# Patient Record
Sex: Male | Born: 1935 | Race: White | Hispanic: No | Marital: Married | Smoking: Former smoker
Health system: Southern US, Community
[De-identification: ages and names within clinical notes are randomized; demographics above are authoritative.]

## PROBLEM LIST (undated history)

## (undated) DIAGNOSIS — E1142 Type 2 diabetes mellitus with diabetic polyneuropathy: Secondary | ICD-10-CM

## (undated) DIAGNOSIS — R55 Syncope and collapse: Secondary | ICD-10-CM

## (undated) DIAGNOSIS — Z8673 Personal history of transient ischemic attack (TIA), and cerebral infarction without residual deficits: Secondary | ICD-10-CM

## (undated) DIAGNOSIS — I2510 Atherosclerotic heart disease of native coronary artery without angina pectoris: Secondary | ICD-10-CM

## (undated) DIAGNOSIS — Z862 Personal history of diseases of the blood and blood-forming organs and certain disorders involving the immune mechanism: Secondary | ICD-10-CM

## (undated) DIAGNOSIS — E119 Type 2 diabetes mellitus without complications: Secondary | ICD-10-CM

## (undated) DIAGNOSIS — I10 Essential (primary) hypertension: Secondary | ICD-10-CM

## (undated) DIAGNOSIS — E785 Hyperlipidemia, unspecified: Secondary | ICD-10-CM

## (undated) DIAGNOSIS — I509 Heart failure, unspecified: Secondary | ICD-10-CM

## (undated) DIAGNOSIS — M1990 Unspecified osteoarthritis, unspecified site: Secondary | ICD-10-CM

## (undated) DIAGNOSIS — D490 Neoplasm of unspecified behavior of digestive system: Secondary | ICD-10-CM

## (undated) DIAGNOSIS — I4891 Unspecified atrial fibrillation: Secondary | ICD-10-CM

## (undated) DIAGNOSIS — G40209 Localization-related (focal) (partial) symptomatic epilepsy and epileptic syndromes with complex partial seizures, not intractable, without status epilepticus: Secondary | ICD-10-CM

## (undated) DIAGNOSIS — I739 Peripheral vascular disease, unspecified: Secondary | ICD-10-CM

## (undated) DIAGNOSIS — K5790 Diverticulosis of intestine, part unspecified, without perforation or abscess without bleeding: Secondary | ICD-10-CM

## (undated) HISTORY — DX: Atherosclerotic heart disease of native coronary artery without angina pectoris: I25.10

## (undated) HISTORY — DX: Unspecified atrial fibrillation: I48.91

## (undated) HISTORY — DX: Heart failure, unspecified: I50.9

## (undated) HISTORY — DX: Peripheral vascular disease, unspecified: I73.9

## (undated) HISTORY — PX: TONSILLECTOMY: SUR1361

## (undated) HISTORY — DX: Type 2 diabetes mellitus with diabetic polyneuropathy: E11.42

## (undated) HISTORY — DX: Localization-related (focal) (partial) symptomatic epilepsy and epileptic syndromes with complex partial seizures, not intractable, without status epilepticus: G40.209

## (undated) HISTORY — DX: Hyperlipidemia, unspecified: E78.5

## (undated) HISTORY — DX: Personal history of diseases of the blood and blood-forming organs and certain disorders involving the immune mechanism: Z86.2

## (undated) HISTORY — DX: Syncope and collapse: R55

## (undated) HISTORY — DX: Diverticulosis of intestine, part unspecified, without perforation or abscess without bleeding: K57.90

## (undated) HISTORY — DX: Neoplasm of unspecified behavior of digestive system: D49.0

## (undated) HISTORY — DX: Type 2 diabetes mellitus without complications: E11.9

## (undated) HISTORY — DX: Unspecified osteoarthritis, unspecified site: M19.90

## (undated) HISTORY — DX: Essential (primary) hypertension: I10

## (undated) HISTORY — DX: Personal history of transient ischemic attack (TIA), and cerebral infarction without residual deficits: Z86.73

---

## 1999-09-10 ENCOUNTER — Emergency Department (HOSPITAL_COMMUNITY): Admission: EM | Admit: 1999-09-10 | Discharge: 1999-09-10 | Payer: Self-pay | Admitting: Emergency Medicine

## 2003-12-27 ENCOUNTER — Emergency Department (HOSPITAL_COMMUNITY): Admission: EM | Admit: 2003-12-27 | Discharge: 2003-12-27 | Payer: Self-pay | Admitting: Emergency Medicine

## 2004-01-20 ENCOUNTER — Ambulatory Visit: Payer: Self-pay | Admitting: Pulmonary Disease

## 2004-01-22 ENCOUNTER — Ambulatory Visit: Payer: Self-pay | Admitting: Pulmonary Disease

## 2004-01-23 ENCOUNTER — Ambulatory Visit: Payer: Self-pay | Admitting: Cardiology

## 2004-01-26 ENCOUNTER — Inpatient Hospital Stay (HOSPITAL_BASED_OUTPATIENT_CLINIC_OR_DEPARTMENT_OTHER): Admission: RE | Admit: 2004-01-26 | Discharge: 2004-01-26 | Payer: Self-pay | Admitting: Cardiology

## 2004-02-13 ENCOUNTER — Inpatient Hospital Stay (HOSPITAL_COMMUNITY)
Admission: RE | Admit: 2004-02-13 | Discharge: 2004-02-19 | Payer: Self-pay | Admitting: Thoracic Surgery (Cardiothoracic Vascular Surgery)

## 2004-02-13 HISTORY — PX: CORONARY ARTERY BYPASS GRAFT: SHX141

## 2004-03-02 ENCOUNTER — Ambulatory Visit: Payer: Self-pay | Admitting: Pulmonary Disease

## 2004-03-02 ENCOUNTER — Ambulatory Visit: Payer: Self-pay | Admitting: Cardiology

## 2004-03-22 ENCOUNTER — Encounter (HOSPITAL_COMMUNITY): Admission: RE | Admit: 2004-03-22 | Discharge: 2004-06-20 | Payer: Self-pay | Admitting: Cardiology

## 2004-03-22 ENCOUNTER — Encounter
Admission: RE | Admit: 2004-03-22 | Discharge: 2004-03-22 | Payer: Self-pay | Admitting: Thoracic Surgery (Cardiothoracic Vascular Surgery)

## 2004-04-02 ENCOUNTER — Ambulatory Visit: Payer: Self-pay | Admitting: Cardiology

## 2004-04-05 ENCOUNTER — Ambulatory Visit: Payer: Self-pay | Admitting: Cardiology

## 2004-04-15 ENCOUNTER — Ambulatory Visit: Payer: Self-pay | Admitting: Pulmonary Disease

## 2004-04-29 ENCOUNTER — Ambulatory Visit: Payer: Self-pay | Admitting: Pulmonary Disease

## 2004-05-11 ENCOUNTER — Inpatient Hospital Stay (HOSPITAL_COMMUNITY): Admission: AD | Admit: 2004-05-11 | Discharge: 2004-05-13 | Payer: Self-pay | Admitting: Pulmonary Disease

## 2004-05-11 ENCOUNTER — Ambulatory Visit: Payer: Self-pay | Admitting: Pulmonary Disease

## 2004-05-12 ENCOUNTER — Encounter (INDEPENDENT_AMBULATORY_CARE_PROVIDER_SITE_OTHER): Payer: Self-pay | Admitting: *Deleted

## 2004-05-27 ENCOUNTER — Ambulatory Visit: Payer: Self-pay | Admitting: Pulmonary Disease

## 2004-06-18 ENCOUNTER — Ambulatory Visit: Payer: Self-pay | Admitting: Pulmonary Disease

## 2004-06-21 ENCOUNTER — Encounter (HOSPITAL_COMMUNITY): Admission: RE | Admit: 2004-06-21 | Discharge: 2004-09-19 | Payer: Self-pay | Admitting: Cardiology

## 2004-06-29 ENCOUNTER — Encounter
Admission: RE | Admit: 2004-06-29 | Discharge: 2004-06-29 | Payer: Self-pay | Admitting: Thoracic Surgery (Cardiothoracic Vascular Surgery)

## 2004-07-07 ENCOUNTER — Ambulatory Visit: Payer: Self-pay | Admitting: Cardiology

## 2004-07-15 ENCOUNTER — Ambulatory Visit: Payer: Self-pay | Admitting: Cardiology

## 2004-07-26 ENCOUNTER — Encounter
Admission: RE | Admit: 2004-07-26 | Discharge: 2004-07-26 | Payer: Self-pay | Admitting: Thoracic Surgery (Cardiothoracic Vascular Surgery)

## 2004-08-19 ENCOUNTER — Ambulatory Visit: Payer: Self-pay | Admitting: Cardiology

## 2004-08-23 ENCOUNTER — Encounter
Admission: RE | Admit: 2004-08-23 | Discharge: 2004-08-23 | Payer: Self-pay | Admitting: Thoracic Surgery (Cardiothoracic Vascular Surgery)

## 2004-08-26 ENCOUNTER — Ambulatory Visit: Payer: Self-pay | Admitting: Pulmonary Disease

## 2004-10-11 ENCOUNTER — Ambulatory Visit: Payer: Self-pay | Admitting: Cardiology

## 2004-12-30 ENCOUNTER — Ambulatory Visit: Payer: Self-pay | Admitting: Pulmonary Disease

## 2005-01-14 ENCOUNTER — Ambulatory Visit: Payer: Self-pay | Admitting: Cardiology

## 2005-04-01 ENCOUNTER — Ambulatory Visit: Payer: Self-pay | Admitting: Pulmonary Disease

## 2005-07-27 ENCOUNTER — Ambulatory Visit: Payer: Self-pay | Admitting: Pulmonary Disease

## 2005-11-24 ENCOUNTER — Ambulatory Visit: Payer: Self-pay | Admitting: Pulmonary Disease

## 2006-01-03 ENCOUNTER — Encounter: Admission: RE | Admit: 2006-01-03 | Discharge: 2006-01-03 | Payer: Self-pay | Admitting: Orthopedic Surgery

## 2006-01-16 ENCOUNTER — Ambulatory Visit: Payer: Self-pay | Admitting: Cardiology

## 2006-03-30 ENCOUNTER — Ambulatory Visit: Payer: Self-pay | Admitting: Pulmonary Disease

## 2006-03-30 LAB — CONVERTED CEMR LAB
ALT: 18 units/L (ref 0–40)
AST: 18 units/L (ref 0–37)
Albumin: 3.7 g/dL (ref 3.5–5.2)
Alkaline Phosphatase: 56 units/L (ref 39–117)
BUN: 20 mg/dL (ref 6–23)
Basophils Absolute: 0.1 10*3/uL (ref 0.0–0.1)
Basophils Relative: 0.8 % (ref 0.0–1.0)
CO2: 27 meq/L (ref 19–32)
Calcium: 9.7 mg/dL (ref 8.4–10.5)
Chloride: 105 meq/L (ref 96–112)
Cholesterol: 119 mg/dL (ref 0–200)
Creatinine, Ser: 1 mg/dL (ref 0.4–1.5)
Eosinophils Relative: 0.7 % (ref 0.0–5.0)
GFR calc Af Amer: 96 mL/min
GFR calc non Af Amer: 79 mL/min
Glucose, Bld: 94 mg/dL (ref 70–99)
HCT: 40.9 % (ref 39.0–52.0)
HDL: 33.8 mg/dL — ABNORMAL LOW (ref >39.0)
Hemoglobin: 13.8 g/dL (ref 13.0–17.0)
Hgb A1c MFr Bld: 6.3 % — ABNORMAL HIGH (ref 4.6–6.0)
LDL Cholesterol: 60 mg/dL (ref 0–99)
Lymphocytes Relative: 20.6 % (ref 12.0–46.0)
MCHC: 33.6 g/dL (ref 30.0–36.0)
MCV: 93.4 fL (ref 78.0–100.0)
Monocytes Absolute: 0.9 10*3/uL — ABNORMAL HIGH (ref 0.2–0.7)
Monocytes Relative: 8.1 % (ref 3.0–11.0)
Neutro Abs: 7.4 10*3/uL (ref 1.4–7.7)
Neutrophils Relative %: 69.8 % (ref 43.0–77.0)
PSA: 2 ng/mL (ref 0.10–4.00)
PSA: NORMAL ng/mL
Platelets: 317 10*3/uL (ref 150–400)
Potassium: 4.3 meq/L (ref 3.5–5.1)
RBC: 4.38 M/uL (ref 4.22–5.81)
RDW: 11.4 % — ABNORMAL LOW (ref 11.5–14.6)
Sodium: 139 meq/L (ref 135–145)
TSH: 0.98 microintl units/mL (ref 0.35–5.50)
Total Bilirubin: 0.7 mg/dL (ref 0.3–1.2)
Total CHOL/HDL Ratio: 3.5
Total Protein: 7.4 g/dL (ref 6.0–8.3)
Triglycerides: 125 mg/dL (ref 0–149)
VLDL: 25 mg/dL (ref 0–40)
WBC: 10.7 10*3/uL — ABNORMAL HIGH (ref 4.5–10.5)

## 2006-05-13 ENCOUNTER — Emergency Department (HOSPITAL_COMMUNITY): Admission: EM | Admit: 2006-05-13 | Discharge: 2006-05-13 | Payer: Self-pay | Admitting: Emergency Medicine

## 2006-09-28 ENCOUNTER — Ambulatory Visit: Payer: Self-pay | Admitting: Pulmonary Disease

## 2006-09-28 LAB — CONVERTED CEMR LAB
BUN: 22 mg/dL (ref 6–23)
CO2: 28 meq/L (ref 19–32)
Calcium: 9.4 mg/dL (ref 8.4–10.5)
Chloride: 105 meq/L (ref 96–112)
Cholesterol: 113 mg/dL (ref 0–200)
Creatinine, Ser: 1.2 mg/dL (ref 0.4–1.5)
GFR calc Af Amer: 77 mL/min
GFR calc non Af Amer: 64 mL/min
Glucose, Bld: 94 mg/dL (ref 70–99)
HDL: 32.2 mg/dL — ABNORMAL LOW (ref >39.0)
Hgb A1c MFr Bld: 6.2 % — ABNORMAL HIGH (ref 4.6–6.0)
LDL Cholesterol: 60 mg/dL (ref 0–99)
Potassium: 4.1 meq/L (ref 3.5–5.1)
Sodium: 140 meq/L (ref 135–145)
Total CHOL/HDL Ratio: 3.5
Triglycerides: 105 mg/dL (ref 0–149)
VLDL: 21 mg/dL (ref 0–40)

## 2006-09-29 ENCOUNTER — Encounter: Payer: Self-pay | Admitting: Pulmonary Disease

## 2006-09-29 DIAGNOSIS — I10 Essential (primary) hypertension: Secondary | ICD-10-CM

## 2006-09-29 DIAGNOSIS — K5730 Diverticulosis of large intestine without perforation or abscess without bleeding: Secondary | ICD-10-CM | POA: Insufficient documentation

## 2006-09-29 DIAGNOSIS — I739 Peripheral vascular disease, unspecified: Secondary | ICD-10-CM | POA: Insufficient documentation

## 2006-09-29 DIAGNOSIS — E1142 Type 2 diabetes mellitus with diabetic polyneuropathy: Secondary | ICD-10-CM | POA: Insufficient documentation

## 2006-09-29 DIAGNOSIS — E1159 Type 2 diabetes mellitus with other circulatory complications: Secondary | ICD-10-CM | POA: Insufficient documentation

## 2006-09-29 DIAGNOSIS — D490 Neoplasm of unspecified behavior of digestive system: Secondary | ICD-10-CM | POA: Insufficient documentation

## 2006-09-29 DIAGNOSIS — G40209 Localization-related (focal) (partial) symptomatic epilepsy and epileptic syndromes with complex partial seizures, not intractable, without status epilepticus: Secondary | ICD-10-CM | POA: Insufficient documentation

## 2006-11-08 ENCOUNTER — Ambulatory Visit: Payer: Self-pay | Admitting: Pulmonary Disease

## 2007-01-23 ENCOUNTER — Ambulatory Visit: Payer: Self-pay | Admitting: Cardiology

## 2007-03-30 DIAGNOSIS — M1990 Unspecified osteoarthritis, unspecified site: Secondary | ICD-10-CM | POA: Insufficient documentation

## 2007-03-30 DIAGNOSIS — E785 Hyperlipidemia, unspecified: Secondary | ICD-10-CM | POA: Insufficient documentation

## 2007-04-23 ENCOUNTER — Telehealth (INDEPENDENT_AMBULATORY_CARE_PROVIDER_SITE_OTHER): Payer: Self-pay | Admitting: *Deleted

## 2007-05-16 ENCOUNTER — Ambulatory Visit: Payer: Self-pay | Admitting: Pulmonary Disease

## 2007-05-16 DIAGNOSIS — I4891 Unspecified atrial fibrillation: Secondary | ICD-10-CM

## 2007-05-16 DIAGNOSIS — E119 Type 2 diabetes mellitus without complications: Secondary | ICD-10-CM

## 2007-05-16 DIAGNOSIS — J209 Acute bronchitis, unspecified: Secondary | ICD-10-CM

## 2007-05-16 DIAGNOSIS — I2510 Atherosclerotic heart disease of native coronary artery without angina pectoris: Secondary | ICD-10-CM | POA: Insufficient documentation

## 2007-05-16 DIAGNOSIS — D649 Anemia, unspecified: Secondary | ICD-10-CM

## 2007-05-16 DIAGNOSIS — I639 Cerebral infarction, unspecified: Secondary | ICD-10-CM | POA: Insufficient documentation

## 2007-05-21 ENCOUNTER — Telehealth (INDEPENDENT_AMBULATORY_CARE_PROVIDER_SITE_OTHER): Payer: Self-pay | Admitting: *Deleted

## 2007-05-22 LAB — CONVERTED CEMR LAB
ALT: 27 units/L (ref 0–53)
AST: 25 units/L (ref 0–37)
Albumin: 3.6 g/dL (ref 3.5–5.2)
Basophils Absolute: 0 10*3/uL (ref 0.0–0.1)
Calcium: 9.5 mg/dL (ref 8.4–10.5)
Chloride: 104 meq/L (ref 96–112)
Creatinine, Ser: 1.3 mg/dL (ref 0.4–1.5)
Eosinophils Absolute: 0.2 10*3/uL (ref 0.0–0.6)
Eosinophils Relative: 1.7 % (ref 0.0–5.0)
GFR calc non Af Amer: 58 mL/min
Glucose, Bld: 141 mg/dL — ABNORMAL HIGH (ref 70–99)
HCT: 41.2 % (ref 39.0–52.0)
LDL Cholesterol: 67 mg/dL (ref 0–99)
MCV: 92 fL (ref 78.0–100.0)
Neutrophils Relative %: 67.2 % (ref 43.0–77.0)
Platelets: 292 10*3/uL (ref 150–400)
RBC: 4.48 M/uL (ref 4.22–5.81)
RDW: 12.3 % (ref 11.5–14.6)
Total Bilirubin: 0.9 mg/dL (ref 0.3–1.2)
Total CHOL/HDL Ratio: 3.8
Triglycerides: 151 mg/dL — ABNORMAL HIGH (ref 0–149)
WBC: 10.7 10*3/uL — ABNORMAL HIGH (ref 4.5–10.5)

## 2007-05-29 ENCOUNTER — Telehealth (INDEPENDENT_AMBULATORY_CARE_PROVIDER_SITE_OTHER): Payer: Self-pay | Admitting: *Deleted

## 2007-05-29 ENCOUNTER — Encounter: Payer: Self-pay | Admitting: Pulmonary Disease

## 2007-06-29 ENCOUNTER — Encounter: Payer: Self-pay | Admitting: Pulmonary Disease

## 2007-07-27 ENCOUNTER — Telehealth: Payer: Self-pay | Admitting: Pulmonary Disease

## 2007-07-27 ENCOUNTER — Telehealth (INDEPENDENT_AMBULATORY_CARE_PROVIDER_SITE_OTHER): Payer: Self-pay | Admitting: *Deleted

## 2007-08-03 ENCOUNTER — Ambulatory Visit: Payer: Self-pay | Admitting: Internal Medicine

## 2007-08-03 ENCOUNTER — Ambulatory Visit: Payer: Self-pay | Admitting: Pulmonary Disease

## 2007-08-03 DIAGNOSIS — M542 Cervicalgia: Secondary | ICD-10-CM | POA: Insufficient documentation

## 2007-08-13 HISTORY — PX: OTHER SURGICAL HISTORY: SHX169

## 2007-08-17 ENCOUNTER — Ambulatory Visit: Payer: Self-pay | Admitting: Internal Medicine

## 2007-09-05 ENCOUNTER — Encounter: Payer: Self-pay | Admitting: Pulmonary Disease

## 2007-09-12 ENCOUNTER — Ambulatory Visit (HOSPITAL_BASED_OUTPATIENT_CLINIC_OR_DEPARTMENT_OTHER): Admission: RE | Admit: 2007-09-12 | Discharge: 2007-09-12 | Payer: Self-pay | Admitting: Surgery

## 2007-09-12 ENCOUNTER — Encounter (INDEPENDENT_AMBULATORY_CARE_PROVIDER_SITE_OTHER): Payer: Self-pay | Admitting: Surgery

## 2007-11-12 ENCOUNTER — Ambulatory Visit: Payer: Self-pay | Admitting: Pulmonary Disease

## 2007-11-14 LAB — CONVERTED CEMR LAB
Albumin: 3.9 g/dL (ref 3.5–5.2)
BUN: 42 mg/dL — ABNORMAL HIGH (ref 6–23)
Calcium: 8.8 mg/dL (ref 8.4–10.5)
Cholesterol: 95 mg/dL (ref 0–200)
Creatinine, Ser: 1.3 mg/dL (ref 0.4–1.5)
GFR calc Af Amer: 70 mL/min
GFR calc non Af Amer: 58 mL/min
Hgb A1c MFr Bld: 6.3 % — ABNORMAL HIGH (ref 4.6–6.0)
LDL Cholesterol: 47 mg/dL (ref 0–99)
Total CHOL/HDL Ratio: 3.2
Triglycerides: 89 mg/dL (ref 0–149)
VLDL: 18 mg/dL (ref 0–40)

## 2008-01-28 ENCOUNTER — Telehealth: Payer: Self-pay | Admitting: Pulmonary Disease

## 2008-01-29 ENCOUNTER — Ambulatory Visit: Payer: Self-pay | Admitting: Cardiology

## 2008-02-05 ENCOUNTER — Ambulatory Visit: Payer: Self-pay | Admitting: Cardiology

## 2008-02-05 LAB — CONVERTED CEMR LAB
CO2: 28 meq/L (ref 19–32)
Calcium: 9 mg/dL (ref 8.4–10.5)
Chloride: 106 meq/L (ref 96–112)
Chloride: 106 meq/L (ref 96–112)
Creatinine, Ser: 0.9 mg/dL (ref 0.4–1.5)
GFR calc non Af Amer: 89 mL/min
Potassium: 3.6 meq/L (ref 3.5–5.1)
Potassium: 3.6 meq/L (ref 3.5–5.1)
Sodium: 140 meq/L (ref 135–145)
Sodium: 140 meq/L (ref 135–145)

## 2008-04-15 ENCOUNTER — Encounter: Payer: Self-pay | Admitting: Pulmonary Disease

## 2008-04-29 ENCOUNTER — Telehealth: Payer: Self-pay | Admitting: Pulmonary Disease

## 2008-05-12 ENCOUNTER — Ambulatory Visit: Payer: Self-pay | Admitting: Pulmonary Disease

## 2008-05-13 ENCOUNTER — Telehealth (INDEPENDENT_AMBULATORY_CARE_PROVIDER_SITE_OTHER): Payer: Self-pay | Admitting: *Deleted

## 2008-05-18 LAB — CONVERTED CEMR LAB
Basophils Absolute: 0 10*3/uL (ref 0.0–0.1)
Bilirubin, Direct: 0.2 mg/dL (ref 0.0–0.3)
Calcium: 9.7 mg/dL (ref 8.4–10.5)
Cholesterol: 119 mg/dL (ref 0–200)
GFR calc Af Amer: 77 mL/min
GFR calc non Af Amer: 63 mL/min
HCT: 40.8 % (ref 39.0–52.0)
Hemoglobin: 14.1 g/dL (ref 13.0–17.0)
LDL Cholesterol: 65 mg/dL (ref 0–99)
Lymphocytes Relative: 35.9 % (ref 12.0–46.0)
MCHC: 34.5 g/dL (ref 30.0–36.0)
Monocytes Absolute: 0.8 10*3/uL (ref 0.1–1.0)
Neutro Abs: 3.5 10*3/uL (ref 1.4–7.7)
PSA: 4.09 ng/mL — ABNORMAL HIGH (ref 0.10–4.00)
RDW: 12.4 % (ref 11.5–14.6)
Sodium: 140 meq/L (ref 135–145)
TSH: 1.54 microintl units/mL (ref 0.35–5.50)
Total Bilirubin: 0.8 mg/dL (ref 0.3–1.2)
Triglycerides: 84 mg/dL (ref 0–149)

## 2008-05-20 ENCOUNTER — Ambulatory Visit: Payer: Self-pay | Admitting: Pulmonary Disease

## 2008-05-20 LAB — CONVERTED CEMR LAB
Leukocytes, UA: NEGATIVE
Specific Gravity, Urine: 1.025 (ref 1.000–1.035)
Urobilinogen, UA: 0.2 (ref 0.0–1.0)

## 2008-05-26 ENCOUNTER — Ambulatory Visit: Payer: Self-pay | Admitting: Internal Medicine

## 2008-06-03 LAB — CONVERTED CEMR LAB
Hemoglobin, Urine: NEGATIVE
Nitrite: NEGATIVE
Urine Glucose: NEGATIVE mg/dL
Urobilinogen, UA: 0.2 (ref 0.0–1.0)

## 2008-06-04 ENCOUNTER — Ambulatory Visit: Payer: Self-pay | Admitting: Internal Medicine

## 2008-06-23 ENCOUNTER — Ambulatory Visit: Payer: Self-pay | Admitting: Pulmonary Disease

## 2008-06-30 LAB — CONVERTED CEMR LAB: PSA: 3.21 ng/mL (ref 0.10–4.00)

## 2008-07-29 ENCOUNTER — Telehealth: Payer: Self-pay | Admitting: Pulmonary Disease

## 2008-11-06 ENCOUNTER — Encounter (INDEPENDENT_AMBULATORY_CARE_PROVIDER_SITE_OTHER): Payer: Self-pay | Admitting: *Deleted

## 2008-11-10 ENCOUNTER — Ambulatory Visit: Payer: Self-pay | Admitting: Pulmonary Disease

## 2008-11-11 LAB — CONVERTED CEMR LAB
AST: 19 units/L (ref 0–37)
Albumin: 4.1 g/dL (ref 3.5–5.2)
Alkaline Phosphatase: 42 units/L (ref 39–117)
Bilirubin, Direct: 0.1 mg/dL (ref 0.0–0.3)
GFR calc non Af Amer: 70.02 mL/min (ref >60)
Glucose, Bld: 72 mg/dL (ref 70–99)
Potassium: 4.6 meq/L (ref 3.5–5.1)
Sodium: 140 meq/L (ref 135–145)
Total CHOL/HDL Ratio: 3
VLDL: 18.4 mg/dL (ref 0.0–40.0)

## 2008-12-04 ENCOUNTER — Encounter: Payer: Self-pay | Admitting: Pulmonary Disease

## 2008-12-12 ENCOUNTER — Encounter: Payer: Self-pay | Admitting: Pulmonary Disease

## 2009-01-21 ENCOUNTER — Ambulatory Visit: Payer: Self-pay | Admitting: Cardiology

## 2009-01-26 ENCOUNTER — Telehealth (INDEPENDENT_AMBULATORY_CARE_PROVIDER_SITE_OTHER): Payer: Self-pay

## 2009-01-27 ENCOUNTER — Ambulatory Visit: Payer: Self-pay | Admitting: Cardiology

## 2009-01-27 ENCOUNTER — Ambulatory Visit: Payer: Self-pay

## 2009-01-27 ENCOUNTER — Encounter (HOSPITAL_COMMUNITY): Admission: RE | Admit: 2009-01-27 | Discharge: 2009-01-27 | Payer: Self-pay | Admitting: Cardiology

## 2009-01-29 ENCOUNTER — Encounter: Payer: Self-pay | Admitting: Cardiology

## 2009-01-29 ENCOUNTER — Ambulatory Visit: Payer: Self-pay | Admitting: Cardiology

## 2009-01-29 DIAGNOSIS — R9430 Abnormal result of cardiovascular function study, unspecified: Secondary | ICD-10-CM | POA: Insufficient documentation

## 2009-01-29 LAB — CONVERTED CEMR LAB
BUN: 16 mg/dL (ref 6–23)
CO2: 27 meq/L (ref 19–32)
Chloride: 107 meq/L (ref 96–112)
Creatinine, Ser: 1.1 mg/dL (ref 0.4–1.5)
Eosinophils Absolute: 0.2 10*3/uL (ref 0.0–0.7)
Glucose, Bld: 81 mg/dL (ref 70–99)
HCT: 38.2 % — ABNORMAL LOW (ref 39.0–52.0)
INR: 1.2 — ABNORMAL HIGH (ref 0.8–1.0)
Lymphs Abs: 2.4 10*3/uL (ref 0.7–4.0)
MCHC: 34.7 g/dL (ref 30.0–36.0)
MCV: 95.3 fL (ref 78.0–100.0)
Monocytes Absolute: 0.6 10*3/uL (ref 0.1–1.0)
Neutrophils Relative %: 53.1 % (ref 43.0–77.0)
Platelets: 248 10*3/uL (ref 150.0–400.0)
Prothrombin Time: 12 s — ABNORMAL HIGH (ref 9.1–11.7)
RDW: 12.3 % (ref 11.5–14.6)

## 2009-02-02 ENCOUNTER — Ambulatory Visit: Payer: Self-pay | Admitting: Cardiovascular Disease

## 2009-02-02 ENCOUNTER — Inpatient Hospital Stay (HOSPITAL_BASED_OUTPATIENT_CLINIC_OR_DEPARTMENT_OTHER): Admission: RE | Admit: 2009-02-02 | Discharge: 2009-02-02 | Payer: Self-pay | Admitting: Cardiovascular Disease

## 2009-02-10 ENCOUNTER — Encounter: Payer: Self-pay | Admitting: Cardiology

## 2009-02-11 ENCOUNTER — Ambulatory Visit: Payer: Self-pay | Admitting: Cardiology

## 2009-02-11 ENCOUNTER — Ambulatory Visit: Payer: Self-pay | Admitting: Cardiovascular Disease

## 2009-02-11 DIAGNOSIS — R0602 Shortness of breath: Secondary | ICD-10-CM

## 2009-02-11 LAB — CONVERTED CEMR LAB
BUN: 26 mg/dL — ABNORMAL HIGH (ref 6–23)
Creatinine, Ser: 1.3 mg/dL (ref 0.4–1.5)
GFR calc non Af Amer: 57.7 mL/min (ref >60)
Glucose, Bld: 111 mg/dL — ABNORMAL HIGH (ref 70–99)

## 2009-04-20 ENCOUNTER — Ambulatory Visit: Payer: Self-pay | Admitting: Cardiology

## 2009-04-20 DIAGNOSIS — I509 Heart failure, unspecified: Secondary | ICD-10-CM

## 2009-05-05 ENCOUNTER — Telehealth (INDEPENDENT_AMBULATORY_CARE_PROVIDER_SITE_OTHER): Payer: Self-pay | Admitting: *Deleted

## 2009-05-11 ENCOUNTER — Ambulatory Visit: Payer: Self-pay | Admitting: Pulmonary Disease

## 2009-05-12 LAB — CONVERTED CEMR LAB
ALT: 18 units/L (ref 0–53)
AST: 20 units/L (ref 0–37)
Alkaline Phosphatase: 43 units/L (ref 39–117)
BUN: 32 mg/dL — ABNORMAL HIGH (ref 6–23)
Basophils Absolute: 0 10*3/uL (ref 0.0–0.1)
Chloride: 108 meq/L (ref 96–112)
Cholesterol: 117 mg/dL (ref 0–200)
Eosinophils Absolute: 0.1 10*3/uL (ref 0.0–0.7)
GFR calc non Af Amer: 52.93 mL/min (ref >60)
Glucose, Bld: 101 mg/dL — ABNORMAL HIGH (ref 70–99)
HCT: 40.2 % (ref 39.0–52.0)
Hgb A1c MFr Bld: 6.1 % (ref 4.6–6.5)
LDL Cholesterol: 54 mg/dL (ref 0–99)
Lymphs Abs: 2.2 10*3/uL (ref 0.7–4.0)
MCV: 95.6 fL (ref 78.0–100.0)
Microalb Creat Ratio: 13.7 mg/g (ref 0.0–30.0)
Monocytes Absolute: 0.6 10*3/uL (ref 0.1–1.0)
Neutrophils Relative %: 46.6 % (ref 43.0–77.0)
Platelets: 200 10*3/uL (ref 150.0–400.0)
Potassium: 5.1 meq/L (ref 3.5–5.1)
RDW: 12.8 % (ref 11.5–14.6)
Sodium: 142 meq/L (ref 135–145)
TSH: 1.27 microintl units/mL (ref 0.35–5.50)
Total Bilirubin: 0.7 mg/dL (ref 0.3–1.2)
VLDL: 20.2 mg/dL (ref 0.0–40.0)
WBC: 5.5 10*3/uL (ref 4.5–10.5)

## 2009-08-03 ENCOUNTER — Telehealth: Payer: Self-pay | Admitting: Pulmonary Disease

## 2009-08-05 ENCOUNTER — Telehealth (INDEPENDENT_AMBULATORY_CARE_PROVIDER_SITE_OTHER): Payer: Self-pay | Admitting: *Deleted

## 2009-10-16 ENCOUNTER — Ambulatory Visit: Payer: Self-pay | Admitting: Cardiology

## 2009-10-30 ENCOUNTER — Ambulatory Visit: Payer: Self-pay | Admitting: Cardiology

## 2009-11-03 ENCOUNTER — Telehealth (INDEPENDENT_AMBULATORY_CARE_PROVIDER_SITE_OTHER): Payer: Self-pay | Admitting: *Deleted

## 2009-11-03 LAB — CONVERTED CEMR LAB
CO2: 27 meq/L (ref 19–32)
Chloride: 105 meq/L (ref 96–112)
Creatinine, Ser: 1.2 mg/dL (ref 0.4–1.5)
Glucose, Bld: 113 mg/dL — ABNORMAL HIGH (ref 70–99)

## 2009-11-09 ENCOUNTER — Ambulatory Visit: Payer: Self-pay | Admitting: Pulmonary Disease

## 2010-02-02 ENCOUNTER — Telehealth: Payer: Self-pay | Admitting: Pulmonary Disease

## 2010-04-11 LAB — CONVERTED CEMR LAB
ALT: 25 units/L (ref 0–53)
Bilirubin, Direct: 0.1 mg/dL (ref 0.0–0.3)
Chloride: 105 meq/L (ref 96–112)
GFR calc non Af Amer: 63.16 mL/min (ref >60)
Glucose, Bld: 117 mg/dL — ABNORMAL HIGH (ref 70–99)
HDL: 31.4 mg/dL — ABNORMAL LOW (ref >39.00)
LDL Cholesterol: 53 mg/dL (ref 0–99)
Potassium: 5.4 meq/L — ABNORMAL HIGH (ref 3.5–5.1)
Sodium: 140 meq/L (ref 135–145)
Total Bilirubin: 0.8 mg/dL (ref 0.3–1.2)
Total CHOL/HDL Ratio: 4
VLDL: 31.6 mg/dL (ref 0.0–40.0)

## 2010-04-13 NOTE — Assessment & Plan Note (Signed)
Summary: 6 MO F/U    CC:  check up.  History of Present Illness: Fred [name] is a pleasant gentleman who has a history of coronary artery disease status post coronary artery bypass graft in December 2005. I saw him for an abnormal Myoview in November of 2010. There was a prior inferior infarct with moderate peri-infarct ischemia. Ejection fraction was 63%. We scheduled a cardiac catheterization. This was performed on February 02, 2009. He had an ejection fraction of 55% but his filling pressures were elevated with a LVEDP of 27. 2 of 4 grafts were patent. He was started on diuretics at that time. I last saw him in February of 2011. Since then the patient has dyspnea with more extreme activities but not with routine activities. It is relieved with rest. It is not associated with chest pain. There is no orthopnea, PND or pedal edema. There is no syncope or palpitations. There is no exertional chest pain.   Current Medications (verified): 1)  Bayer Aspirin 325 Mg  Tabs (Aspirin) .... Take 1 Tablet By Mouth Once A Day 2)  Metoprolol Succinate 50 Mg Xr24h-Tab (Metoprolol Succinate) .... Take 1 Tab By Mouth Once Daily.Marland KitchenMarland Kitchen 3)  Zestril 10 Mg  Tabs (Lisinopril) .... Take 1 Tablet By Mouth Once A Day 4)  Lasix 40 Mg Tabs (Furosemide) .... Take One Daily 5)  Klor-Con M20 20 Meq Cr-Tabs (Potassium Chloride Crys Cr) .Marland Kitchen.. 1 Tab Once Daily 6)  Zocor 40 Mg  Tabs (Simvastatin) .... Take 1 Tablet By Mouth Once A Day 7)  Zetia 10 Mg  Tabs (Ezetimibe) .... Take 1 Tablet By Mouth Once A Day 8)  Niaspan 500 Mg  Tbcr (Niacin (Antihyperlipidemic)) .... Take 1 Tab By Mouth At Bedtime 9)  Co Q-10 30 Mg  Caps (Coenzyme Q10) .Marland Kitchen.. 1 Tab By Mouth Once Daily 10)  Glucophage 1000 Mg  Tabs (Metformin Hcl) .... Take 1/2 Tab By Mouth Two Times A Day 11)  Glimepiride 4 Mg  Tabs (Glimepiride) .... Take 1/2 Tab By Mouth Once Daily 12)  Januvia 100 Mg  Tabs (Sitagliptin Phosphate) .... Take 1/2 Tablet By Mouth Once A  Day  Allergies: No Known Drug Allergies  Past History:  Past Medical History: HYPERTENSION (ICD-401.9) C A D - S/P C A B G (ICD-414.9) Hx of postoperative ATRIAL FIBRILLATION (ICD-427.31) PERIPHERAL VASCULAR DISEASE (ICD-443.9) Hx of STROKE (ICD-434.91) HYPERLIPIDEMIA (ICD-272.4) PAROTID LESION, UNSPECIFIED (ICD-239.0) DIABETES MELLITUS (ICD-250.00) DIVERTICULOSIS (ICD-562.10) POLYNEUROPATHY-DIABETIC (ICD-357.2) SEIZURE-PARTIAL COMPLEX (ICD-345.40) DEGENERATIVE JOINT DISEASE (ICD-715.90)  Past Surgical History: Reviewed history from 05/11/2009 and no changes required. S/P coronary artery bypass graft S/P removal of sebaceous cyst from neck 6/09 by DrCornett  Social History: Reviewed history from 11/12/2007 and no changes required. Married quit smoking in 1979 exercises occasionaly caffeine use:  2 cups per day no etoh 3 children  Review of Systems       no fevers or chills, productive cough, hemoptysis, dysphasia, odynophagia, melena, hematochezia, dysuria, hematuria, rash, seizure activity, orthopnea, PND, pedal edema, claudication. Remaining systems are negative.   Vital Signs:  Patient profile:   75 year old male Height:      63 inches Weight:      191 pounds BMI:     33.96 Pulse rate:   65 / minute Resp:     12 per minute BP sitting:   128 / 69  (left arm)  Vitals Entered By: Fred [name] (October 16, 2009 9:46 AM)  Physical Exam  General:  Well-developed well-nourished in  no acute distress.  Skin is warm and dry.  HEENT is normal.  Neck is supple. No thyromegaly.  Chest is clear to auscultation with normal expansion.  Cardiovascular exam is regular rate and rhythm.  Abdominal exam nontender or distended. No masses palpated. Extremities show no edema. neuro grossly intact    EKG  Procedure date:  10/16/2009  Findings:      Sinus rhythm at a rate of 64. Left axis deviation. No ST changes.  Impression & Recommendations:  Problem # 1:   CHF (ICD-428.0) Patient with history of diastolic congestive heart failure. Euvolemic on examination. Continue Lasix. Check renal function and potassium. His updated medication list for this problem includes:    Bayer Aspirin 325 Mg Tabs (Aspirin) .Marland Kitchen... Take 1 tablet by mouth once a day    Metoprolol Succinate 50 Mg Xr24h-tab (Metoprolol succinate) .Marland Kitchen... Take 1 tab by mouth once daily...    Zestril 10 Mg Tabs (Lisinopril) .Marland Kitchen... Take 1 tablet by mouth once a day    Lasix 40 Mg Tabs (Furosemide) .Marland Kitchen... Take one daily  Problem # 2:  HYPERTENSION (ICD-401.9) Blood pressure controlled on present medications. Will continue. His updated medication list for this problem includes:    Bayer Aspirin 325 Mg Tabs (Aspirin) .Marland Kitchen... Take 1 tablet by mouth once a day    Metoprolol Succinate 50 Mg Xr24h-tab (Metoprolol succinate) .Marland Kitchen... Take 1 tab by mouth once daily...    Zestril 10 Mg Tabs (Lisinopril) .Marland Kitchen... Take 1 tablet by mouth once a day    Lasix 40 Mg Tabs (Furosemide) .Marland Kitchen... Take one daily  His updated medication list for this problem includes:    Bayer Aspirin 325 Mg Tabs (Aspirin) .Marland Kitchen... Take 1 tablet by mouth once a day    Metoprolol Succinate 50 Mg Xr24h-tab (Metoprolol succinate) .Marland Kitchen... Take 1 tab by mouth once daily...    Zestril 10 Mg Tabs (Lisinopril) .Marland Kitchen... Take 1 tablet by mouth once a day    Lasix 40 Mg Tabs (Furosemide) .Marland Kitchen... Take one daily  Problem # 3:  C A D - S/P C A B G (ICD-414.9) Continue aspirin, beta blocker, ACE inhibitor and statin. His updated medication list for this problem includes:    Bayer Aspirin 325 Mg Tabs (Aspirin) .Marland Kitchen... Take 1 tablet by mouth once a day    Metoprolol Succinate 50 Mg Xr24h-tab (Metoprolol succinate) .Marland Kitchen... Take 1 tab by mouth once daily...    Zestril 10 Mg Tabs (Lisinopril) .Marland Kitchen... Take 1 tablet by mouth once a day  His updated medication list for this problem includes:    Bayer Aspirin 325 Mg Tabs (Aspirin) .Marland Kitchen... Take 1 tablet by mouth once a day     Metoprolol Succinate 50 Mg Xr24h-tab (Metoprolol succinate) .Marland Kitchen... Take 1 tab by mouth once daily...    Zestril 10 Mg Tabs (Lisinopril) .Marland Kitchen... Take 1 tablet by mouth once a day  Problem # 4:  HYPERLIPIDEMIA (ICD-272.4) Continue present medications. Check lipids and liver. His updated medication list for this problem includes:    Zocor 40 Mg Tabs (Simvastatin) .Marland Kitchen... Take 1 tablet by mouth once a day    Zetia 10 Mg Tabs (Ezetimibe) .Marland Kitchen... Take 1 tablet by mouth once a day    Niaspan 500 Mg Tbcr (Niacin (antihyperlipidemic)) .Marland Kitchen... Take 1 tab by mouth at bedtime  Orders: TLB-Lipid Panel (80061-LIPID) TLB-Hepatic/Liver Function Pnl (80076-HEPATIC)  His updated medication list for this problem includes:    Zocor 40 Mg Tabs (Simvastatin) .Marland Kitchen... Take 1 tablet by mouth once a  day    Zetia 10 Mg Tabs (Ezetimibe) .Marland Kitchen... Take 1 tablet by mouth once a day    Niaspan 500 Mg Tbcr (Niacin (antihyperlipidemic)) .Marland Kitchen... Take 1 tab by mouth at bedtime  Problem # 5:  DIABETES MELLITUS (ICD-250.00)  His updated medication list for this problem includes:    Bayer Aspirin 325 Mg Tabs (Aspirin) .Marland Kitchen... Take 1 tablet by mouth once a day    Zestril 10 Mg Tabs (Lisinopril) .Marland Kitchen... Take 1 tablet by mouth once a day    Glucophage 1000 Mg Tabs (Metformin hcl) .Marland Kitchen... Take 1/2 tab by mouth two times a day    Glimepiride 4 Mg Tabs (Glimepiride) .Marland Kitchen... Take 1/2 tab by mouth once daily    Januvia 100 Mg Tabs (Sitagliptin phosphate) .Marland Kitchen... Take 1/2 tablet by mouth once a day  Other Orders: TLB-BMP (Basic Metabolic Panel-BMET) (80048-METABOL)  Patient Instructions: 1)  Your physician recommends that you schedule a follow-up appointment in: 9 months 2)  Your physician recommends that you continue on your current medications as directed. Please refer to the Current Medication list given to you today.

## 2010-04-13 NOTE — Progress Notes (Signed)
Summary: refill  Phone Note Call from Patient   Caller: grandaughter//mindy Call For: Fred [name] Reason for Call: Refill Medication Summary of Call: Needs written rx for zetia 10mg  to pickup. Initial call taken by: Darletta Moll,  Aug 03, 2009 8:56 AM  Follow-up for Phone Call        rx pritned and placed on SN look-at to sign. Carron Curie CMA  Aug 03, 2009 10:00 AM   rx has been signed and is ready to be picked up.  pt is aware Randell Loop CMA  Aug 03, 2009 10:40 AM     Prescriptions: ZESTRIL 10 MG  TABS (LISINOPRIL) Take 1 tablet by mouth once a day  #90 x 3   Entered by:   Carron Curie CMA   Authorized by:   Michele Mcalpine MD   Signed by:   Carron Curie CMA on 08/03/2009   Method used:   Print then Give to Patient   RxID:   [id number]

## 2010-04-13 NOTE — Progress Notes (Signed)
Summary: Zetia RX  Phone Note Call from Patient Call back at Home Phone [phone]   Caller: Spouse-Shirley Call For: nadel Reason for Call: Talk to Nurse Summary of Call: Zetia 10 mg  -  need written rx for 90 day supply w/3 refills  -  will pick up Initial call taken by: Eugene Gavia,  November 03, 2009 9:42 AM  Follow-up for Phone Call        RX printed and patient will pick up to take to [facility].Michel Bickers CMA  November 03, 2009 11:53 AM    Prescriptions: ZETIA 10 MG  TABS (EZETIMIBE) Take 1 tablet by mouth once a day  #90 x 3   Entered by:   Michel Bickers CMA   Authorized by:   Michele Mcalpine MD   Signed by:   Michel Bickers CMA on 11/03/2009   Method used:   Print then Give to Patient   RxID:   [id number]

## 2010-04-13 NOTE — Assessment & Plan Note (Signed)
Summary: 2 month rov/sl      Allergies Added: NKDA  History of Present Illness: [name] is a pleasant gentleman who has a history of coronary artery disease status post coronary artery bypass graft in December 2005. I saw him for an abnormal Myoview in November of 2010. There was a prior inferior infarct with moderate peri-infarct ischemia. Ejection fraction was 63%. We scheduled a cardiac catheterization. This was performed on February 02, 2009. He had an ejection fraction of 55% but his filling pressures were elevated with a LVEDP of 27. 2 of 4 grafts were patent. He was started on diuretics at that time. Since then his dyspnea has improved. He does have some dyspnea on exertion relieved with rest but much improved compared to previous. There is no orthopnea, PND, pedal edema, palpitations, syncope or chest pain. There is no associated chest pain with his dyspnea and it is relieved with rest.   Current Medications (verified): 1)  Bayer Aspirin 325 Mg  Tabs (Aspirin) .... Take 1 Tablet By Mouth Once A Day 2)  Metoprolol Succinate 50 Mg Xr24h-Tab (Metoprolol Succinate) .... Take 1 Tab By Mouth Once Daily.Marland KitchenMarland Kitchen 3)  Zestril 10 Mg  Tabs (Lisinopril) .... Take 1 Tablet By Mouth Once A Day 4)  Hydrochlorothiazide 25 Mg  Tabs (Hydrochlorothiazide) .... Take 1/2 Tablet By Mouth Once A Day 5)  Zocor 40 Mg  Tabs (Simvastatin) .... Take 1 Tablet By Mouth Once A Day 6)  Zetia 10 Mg  Tabs (Ezetimibe) .... Take 1 Tablet By Mouth Once A Day 7)  Niaspan 500 Mg  Tbcr (Niacin (Antihyperlipidemic)) .... Take 1 Tab By Mouth At Bedtime 8)  Glucophage 1000 Mg  Tabs (Metformin Hcl) .... Take 1/2 Tab By Mouth Two Times A Day 9)  Glimepiride 4 Mg  Tabs (Glimepiride) .... Take 1/2 Tab By Mouth Once Daily 10)  Actos 30 Mg  Tabs (Pioglitazone Hcl) .... 1/2  Tablet By Mouth Once A Day 11)  Januvia 100 Mg  Tabs (Sitagliptin Phosphate) .... Take 1/2 Tablet By Mouth Once A Day 12)  Tramadol Hcl 50 Mg  Tabs (Tramadol Hcl)  .Marland Kitchen.. 1-2 By Mouth Every 4 Hr As Needed. 13)  Co Q-10 30 Mg  Caps (Coenzyme Q10) .Marland Kitchen.. 1 Tab By Mouth Once Daily 14)  Flax   Oil (Flaxseed (Linseed)) .Marland Kitchen.. 1 Tab By Mouth Once Daily 15)  Oracea 40 Mg Cpdr (Doxycycline (Rosacea)) .... As Directed 16)  Lasix 40 Mg Tabs (Furosemide) .... Take One Daily 17)  K-Lor 20 Meq Pack (Potassium Chloride) .... Take One Daily  Allergies (verified): No Known Drug Allergies  Past History:  Past Medical History: Reviewed history from 01/21/2009 and no changes required. Current Problems:  HYPERTENSION (ICD-401.9) C A D - S/P C A B G (ICD-414.9) Hx of postoperative ATRIAL FIBRILLATION (ICD-427.31) PERIPHERAL VASCULAR DISEASE (ICD-443.9) Hx of STROKE (ICD-434.91) HYPERLIPIDEMIA (ICD-272.4) PAROTID LESION, UNSPECIFIED (ICD-239.0) DIABETES MELLITUS (ICD-250.00) DIVERTICULOSIS (ICD-562.10) POLYNEUROPATHY-DIABETIC (ICD-357.2) SEIZURE-PARTIAL COMPLEX (ICD-345.40) DEGENERATIVE JOINT DISEASE (ICD-715.90)  Past Surgical History: Reviewed history from 11/10/2008 and no changes required. S/P coronary artery bypass graft S/P removal of sebaceous cyst from neck 6/09 by DrCornett  Social History: Reviewed history from 11/12/2007 and no changes required. Married quit smoking in 1979 exercises occasionaly caffeine use:  2 cups per day no etoh 3 children  Review of Systems       no fevers or chills, productive cough, hemoptysis, dysphasia, odynophagia, melena, hematochezia, dysuria, hematuria, rash, seizure activity, orthopnea, PND, pedal edema, claudication. Remaining systems  are negative.   Vital Signs:  Patient profile:   74 year old male Pulse rate:   60 / minute Pulse rhythm:   regular BP sitting:   130 / 60  (left arm) Cuff size:   large  Vitals Entered By: Deliah Goody, RN (April 20, 2009 8:02 AM)   Impression & Recommendations:  Problem # 1:  CHF (ICD-428.0) Pt has diastolic congestive heart failure. I will continue with his present  dose of Lasix. I will discontinue his hydrochlorothiazide. Continue remaining medications. The following medications were removed from the medication list:    Hydrochlorothiazide 25 Mg Tabs (Hydrochlorothiazide) .Marland Kitchen... Take 1/2 tablet by mouth once a day His updated medication list for this problem includes:    Bayer Aspirin 325 Mg Tabs (Aspirin) .Marland Kitchen... Take 1 tablet by mouth once a day    Metoprolol Succinate 50 Mg Xr24h-tab (Metoprolol succinate) .Marland Kitchen... Take 1 tab by mouth once daily...    Zestril 10 Mg Tabs (Lisinopril) .Marland Kitchen... Take 1 tablet by mouth once a day    Lasix 40 Mg Tabs (Furosemide) .Marland Kitchen... Take one daily  The following medications were removed from the medication list:    Hydrochlorothiazide 25 Mg Tabs (Hydrochlorothiazide) .Marland Kitchen... Take 1/2 tablet by mouth once a day His updated medication list for this problem includes:    Bayer Aspirin 325 Mg Tabs (Aspirin) .Marland Kitchen... Take 1 tablet by mouth once a day    Metoprolol Succinate 50 Mg Xr24h-tab (Metoprolol succinate) .Marland Kitchen... Take 1 tab by mouth once daily...    Zestril 10 Mg Tabs (Lisinopril) .Marland Kitchen... Take 1 tablet by mouth once a day    Lasix 40 Mg Tabs (Furosemide) .Marland Kitchen... Take one daily  Problem # 2:  HYPERTENSION (ICD-401.9) Blood pressure controlled on present medications. Will continue. The following medications were removed from the medication list:    Hydrochlorothiazide 25 Mg Tabs (Hydrochlorothiazide) .Marland Kitchen... Take 1/2 tablet by mouth once a day His updated medication list for this problem includes:    Bayer Aspirin 325 Mg Tabs (Aspirin) .Marland Kitchen... Take 1 tablet by mouth once a day    Metoprolol Succinate 50 Mg Xr24h-tab (Metoprolol succinate) .Marland Kitchen... Take 1 tab by mouth once daily...    Zestril 10 Mg Tabs (Lisinopril) .Marland Kitchen... Take 1 tablet by mouth once a day    Lasix 40 Mg Tabs (Furosemide) .Marland Kitchen... Take one daily  Problem # 3:  C A D - S/P C A B G (ICD-414.9) Continue aspirin, beta blocker, ACE inhibitor and statin. His updated medication list for  this problem includes:    Bayer Aspirin 325 Mg Tabs (Aspirin) .Marland Kitchen... Take 1 tablet by mouth once a day    Metoprolol Succinate 50 Mg Xr24h-tab (Metoprolol succinate) .Marland Kitchen... Take 1 tab by mouth once daily...    Zestril 10 Mg Tabs (Lisinopril) .Marland Kitchen... Take 1 tablet by mouth once a day  Problem # 4:  HYPERLIPIDEMIA (ICD-272.4) Continue present medications. Lipids and liver monitored by his primary care. His updated medication list for this problem includes:    Zocor 40 Mg Tabs (Simvastatin) .Marland Kitchen... Take 1 tablet by mouth once a day    Zetia 10 Mg Tabs (Ezetimibe) .Marland Kitchen... Take 1 tablet by mouth once a day    Niaspan 500 Mg Tbcr (Niacin (antihyperlipidemic)) .Marland Kitchen... Take 1 tab by mouth at bedtime  Problem # 5:  DIABETES MELLITUS (ICD-250.00) Patient is concerned about continuing Actos. Given his heart failure I think a different agent is also warranted. I've asked him to discuss this with his  primary care physician at next visit. His updated medication list for this problem includes:    Bayer Aspirin 325 Mg Tabs (Aspirin) .Marland Kitchen... Take 1 tablet by mouth once a day    Zestril 10 Mg Tabs (Lisinopril) .Marland Kitchen... Take 1 tablet by mouth once a day    Glucophage 1000 Mg Tabs (Metformin hcl) .Marland Kitchen... Take 1/2 tab by mouth two times a day    Glimepiride 4 Mg Tabs (Glimepiride) .Marland Kitchen... Take 1/2 tab by mouth once daily    Actos 30 Mg Tabs (Pioglitazone hcl) .Marland Kitchen... 1/2  tablet by mouth once a day    Januvia 100 Mg Tabs (Sitagliptin phosphate) .Marland Kitchen... Take 1/2 tablet by mouth once a day  Patient Instructions: 1)  Your physician recommends that you schedule a follow-up appointment in: 6 months 2)  Your physician has recommende d you make the following change in your medication: stop HCTZ  Appended Document: 2 month rov/sl Well-developed well-nourished in no acute distress.  Skin is warm and dry.  HEENT is normal.  Neck is supple. No thyromegaly.  Chest is clear to auscultation with normal expansion.  Cardiovascular exam is  regular rate and rhythm.  Abdominal exam nontender or distended. No masses palpated. Extremities show no edema. neuro grossly intact

## 2010-04-13 NOTE — Assessment & Plan Note (Signed)
Summary: 6 months/apc   CC:  6 month ROV & review of mult medical problems....  History of Present Illness: 75 y/o WM here for a follow up visit...  he has multiple medical problems as noted below...  he gets his meds from [facility]...   ~  Mar09:  his routine PSA was up at 4.89... sent to Urology and seen by DrDahlstedt w/ asymtomatic UTI found and treated w/ antibiotics... repeat PSA was 2.10.  ~  May09:  saw TP w/ neck pain... XRays showed DDD C5-6 & C6-7... treated w/ anti-inflamm and Skelaxin w/ improvement...  ~  Jun09:  had cyst removed from the left post neck by DrCornett as outpt...   ~  November 10, 2008:  another good 6 months without new complaints or concerns... unfortunately he has not been dieting or exercising & his weight is unchanged... BP, Chol, & BS have been well controlled on meds- not checking BS at home...   ~  May 11, 2009:  Cards f/u 11/10 w/ Myoview that was abnormal- subseq Cath showed 2/4 grafts occluded & med Rx recommended w/ Lasix & K20 started (improved dyspnea)...  his BP is controlled;  denies angina, SOB, edema;  Chol has been stable on meds;  sugars OK on med Rx w/ home BS  ~90-120 by his report...   ~  November 09, 2009:  he just had f/u DrCrenshaw for Cards- K was 5.4 & KCl discontinued, otherw stable... he did fasting labs> reviewed w/ pt & he will continue same Rx... he denies intercurrent bronchial problems, BP controlled, no CP/ palpit/ etc, Chol & BS look good on Rx...    Current Problem List:  BRONCHITIS, ACUTE WITH MILD BRONCHOSPASM (ICD-466.0) - he is an ex-smoker, quit in the 1970's... he denies cough, sputum, hemoptysis, worsening dyspnea, wheezing, chest pains, snoring, daytime hypersomnolence, etc...   ~  CXR 3/10 showed NAD- s/p med sternotomy, borderline hrt size, etc...  ~  CXR 11/10 in hosp showed s/p CABG, low lung vols, basilar atx, NAD...  PAROTID LESION, UNSPECIFIED (ICD-239.0) - hx of bilat parotid hypertrophy without nodules... he  states normal saliva, no dry mouth etc...  HYPERTENSION (ICD-401.9) - controlled on TOPROLXL 50mg /d, ZESTRIL 10mg /d, LASIX 40mg /d... BP= 118/74 and doing well denies HA, fatigue, visual changes, CP, palipit, dizziness, syncope, dyspnea, edema, etc... still not checking BP's at home.  C A D - S/P C A B G (ICD-414.9) - he had 4 vessel CABG in 12/05... he has left pleural effusion post-CABG... followed by Aletta Edouard for cardiology on the above meds + ASA daily... working on secondary risk factor reductions... min exercise w/ yard work and we discussed the need for daily ex indoors... DrCrenshaws notes reviewed>> Diastolic CHF treated w/ Lasix/ KCl.  ~  Baseline EKG showed NSR, NSSTTWA, NAD...  ~  Myoview 11/10 showed scar inferiorly w/ ischemia superimposed... mild HK inferiorly w/ EF=63%...  ~  Cath 11/10 showed diffuse 50% proxLAD w/ 100% occlusion in mid portion (distal filled via left IM graft), mild plaque in CIRC w/ marginal branches occluded at the ostia, 100% prox RCA- PDA fills from collateral flow>> 2/4 grafts occluded... MED Rx.  Hx of PAROXYSMAL ATRIAL FIBRILLATION (ICD-427.31) - no CP, palpit, dizzy, etc...  PERIPHERAL VASCULAR DISEASE (ICD-443.9)  HYPERLIPIDEMIA (ICD-272.4) - on SIMVASTATIN 40mg /d, ZETIA 10mg /d, NIASPAN 500mg /d & CoQ10...   ~  FLP 7/08 showed TChol 113, TG 105, HDL 32, LDL 60  ~  FLP 3/09 showed TChol 132, TG 151, HDL 35, LDL  67  ~  FLP 8/09 showed TChol 95, TG 89, HDL 30, LDL 47... rec- continue same.  ~  FLP 3/10 showed TChol 119, TG 84, HDL 37, LDL 65  ~  FLP 8/10 showed TChol 115, TG 92, HDL 37, LDL 59  ~  FLP 2/11 showed TChol 117, TG 101, HDL 43, LDL 54  ~  FLP 8/11 showed TChol 116, TG 158, HDL 31, LDL 53... same meds, low fat diet, get wt down, incr exercise!  DIABETES MELLITUS (ICD-250.00) - on diet + METFORMIN 1000mg tabs- 1/2 tabBid, GLIMEPIRIDE 4mg tabs- 1/2 tab/d, ACTOS 30- 1/2 tab daily,  & JANUVIA 100mg - 1/2 tab daily... states home checks all  90-120...  ~  labs 7/08 showed FBS=94and HgA1c= 6.2  ~  labs 3/09 showed BS= 141, HgA1c= 6.9  ~  labs 8/09 showed BS= 106, HgA1c= 6.3.Marland KitchenMarland Kitchen rec- continue the same, get weight down!  ~  labs 3/10 showed BS= 83, A1c= 6.5  ~  labs 8/10 showed BS= 72, A1c= 6.1.Marland Kitchen. let's try to save some $$ by decr the Actos & Januvia to 1/2/d...  ~  labs 2/11 showed BS= 101, A1c= 6.1.Marland KitchenMarland Kitchen OK to stop the Actos.  ~  labs 8/11 showed BS= 113-117  DIVERTICULOSIS (ICD-562.10) - flex sig 1997 by DrStark showed divertics, int hem...  ~  3/10: referred back to DrStark for colonoscopy- done 06/04/08 & showed divertics, hem, otherw neg... f/u rec Prn.  POLYNEUROPATHY (ICD-357.2) - diabetic vs other (he was in [place] w/ exposure there)...  Hx of STROKE (ICD-434.91) - hosp in 2006 w/ MRI showing sm vessel dis, old left thalamic infarct, & atherosclerotic changes on MRA as well... he continues on ASA 81mg /d...  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 no problems in recent yrs.  DEGENERATIVE JOINT DISEASE (ICD-715.90) - he uses OTC meds> Tylenol, Aleve, etc...  Hx of ANEMIA / OTHER (ICD-285.9)  ~  labs 2/11 showed Hg= 13.5   Preventive Screening-Counseling & Management  Alcohol-Tobacco     Smoking Status: quit     Packs/Day: 1      Year Started: 1957     Year Quit: 1979     Pack years: 60yrs, 1ppd  Caffeine-Diet-Exercise     Exercise (avg: min/session): 6:00  Allergies (verified): No Known Drug Allergies  Comments:  Nurse/Medical Assistant: The patient's medications and allergies were reviewed with the patient and were updated in the Medication and Allergy Lists.  Past History:  Past Medical History: PAROTID LESION, UNSPECIFIED (ICD-239.0) BRONCHITIS, ACUTE WITH MILD BRONCHOSPASM (ICD-466.0) HYPERTENSION (ICD-401.9) C A D - S/P C A B G (ICD-414.9) CHF (ICD-428.0) Hx of postoperative PAROXYSMAL ATRIAL FIBRILLATION (ICD-427.31) PERIPHERAL VASCULAR  DISEASE (ICD-443.9) Hx of STROKE (ICD-434.91) HYPERLIPIDEMIA (ICD-272.4) DIABETES MELLITUS (ICD-250.00) DIVERTICULOSIS (ICD-562.10) POLYNEUROPATHY-DIABETIC (ICD-357.2) SEIZURE-PARTIAL COMPLEX (ICD-345.40) DEGENERATIVE JOINT DISEASE (ICD-715.90) NECK PAIN (ICD-723.1) Hx of ANEMIA / OTHER (ICD-285.9)  Past Surgical History: S/P coronary artery bypass graft S/P removal of sebaceous cyst from neck 6/09 by DrCornett  Family History: Reviewed history from 11/12/2007 and no changes required. mother deceased age 64 from stroke father deceased age 41  Social History: Reviewed history from 11/12/2007 and no changes required. Married quit smoking in 1979 exercises occasionaly caffeine use:  2 cups per day no etoh 3 children Packs/Day:  1   Review of Systems      See HPI       The patient complains of dyspnea on exertion.  The patient denies anorexia, fever, weight  loss, weight gain, vision loss, decreased hearing, hoarseness, chest pain, syncope, peripheral edema, prolonged cough, headaches, hemoptysis, abdominal pain, melena, hematochezia, severe indigestion/heartburn, hematuria, incontinence, muscle weakness, suspicious skin lesions, transient blindness, difficulty walking, depression, unusual weight change, abnormal bleeding, enlarged lymph nodes, and angioedema.    Vital Signs:  Patient profile:   75 year old male Height:      63 inches Weight:      193.13 pounds O2 Sat:      98 % on Room air Temp:     97.3 degrees F oral Pulse rate:   61 / minute BP sitting:   118 / 74  (right arm) Cuff size:   regular  Vitals Entered By: Randell Loop CMA (November 09, 2009 9:36 AM)  O2 Sat at Rest %:  98 O2 Flow:  Room air CC: 6 month ROV & review of mult medical problems... Is Patient Diabetic? No Pain Assessment Patient in pain? no      Comments meds udpated today with pt   Physical Exam  Additional Exam:  WD, WN, 75 y/o WM in NAD... GENERAL:  Alert & oriented; pleasant &  cooperative... HEENT:  [place]/AT, EOM-wnl, PERRLA, EACs-clear, TMs-wnl, NOSE-clear, THROAT-clear & wnl. NECK:  Supple w/ fairROM; no JVD; normal carotid impulses w/o bruits; no thyromegaly or nodules palpated; no lymphadenopathy. CHEST:  Clear to P & A... no rales or signs of consolidation... HEART:  Regular Rhythm; without murmurs/ rubs/ or gallops heard... ABDOMEN:  Soft & nontender; normal bowel sounds; no organomegaly or masses detected. EXT: without deformities, mild arthritic changes; no varicose veins/ +venous insuffic/ no edema. NEURO:  CN's intact; no focal neuro deficits... DERM:  No lesions noted; no rash etc...    Impression & Recommendations:  Problem # 1:  BRONCHITIS, ACUTE WITH MILD BRONCHOSPASM (ICD-466.0) No intercurrent infections... doing satis but needs incr exercise...  Problem # 2:  HYPERTENSION (ICD-401.9) Controlled>  same meds... His updated medication list for this problem includes:    Metoprolol Succinate 50 Mg Xr24h-tab (Metoprolol succinate) .Marland Kitchen... Take 1 tab by mouth once daily...    Zestril 10 Mg Tabs (Lisinopril) .Marland Kitchen... Take 1 tablet by mouth once a day    Lasix 40 Mg Tabs (Furosemide) .Marland Kitchen... Take one daily  Problem # 3:  C A D - S/P C A B G (ICD-414.9) Per DrCrenshaw> his note is reviewed w/ pt... His updated medication list for this problem includes:    Bayer Aspirin 325 Mg Tabs (Aspirin) .Marland Kitchen... Take 1 tablet by mouth once a day    Metoprolol Succinate 50 Mg Xr24h-tab (Metoprolol succinate) .Marland Kitchen... Take 1 tab by mouth once daily...    Zestril 10 Mg Tabs (Lisinopril) .Marland Kitchen... Take 1 tablet by mouth once a day    Lasix 40 Mg Tabs (Furosemide) .Marland Kitchen... Take one daily  Problem # 4:  CHF (ICD-428.0) Continue meds including LASIX... His updated medication list for this problem includes:    Bayer Aspirin 325 Mg Tabs (Aspirin) .Marland Kitchen... Take 1 tablet by mouth once a day    Metoprolol Succinate 50 Mg Xr24h-tab (Metoprolol succinate) .Marland Kitchen... Take 1 tab by mouth once daily...     Zestril 10 Mg Tabs (Lisinopril) .Marland Kitchen... Take 1 tablet by mouth once a day    Lasix 40 Mg Tabs (Furosemide) .Marland Kitchen... Take one daily  Problem # 5:  HYPERLIPIDEMIA (ICD-272.4) FLP per DrCrenshaw reviewed... pt advised to get on a better low fat diet, get weight down, continue meds. His updated medication list for this problem includes:  Zocor 40 Mg Tabs (Simvastatin) .Marland Kitchen... Take 1 tablet by mouth once a day    Zetia 10 Mg Tabs (Ezetimibe) .Marland Kitchen... Take 1 tablet by mouth once a day    Niaspan 500 Mg Tbcr (Niacin (antihyperlipidemic)) .Marland Kitchen... Take 1 tab by mouth at bedtime  Problem # 6:  DIABETES MELLITUS (ICD-250.00) DM controll is good on these meds... needs better diet & incr exercise... His updated medication list for this problem includes:    Bayer Aspirin 325 Mg Tabs (Aspirin) .Marland Kitchen... Take 1 tablet by mouth once a day    Zestril 10 Mg Tabs (Lisinopril) .Marland Kitchen... Take 1 tablet by mouth once a day    Glucophage 1000 Mg Tabs (Metformin hcl) .Marland Kitchen... Take 1/2 tab by mouth two times a day    Glimepiride 4 Mg Tabs (Glimepiride) .Marland Kitchen... Take 1/2 tab by mouth once daily    Januvia 100 Mg Tabs (Sitagliptin phosphate) .Marland Kitchen... Take 1/2 tablet by mouth once a day  Problem # 7:  OTHER MEDICAL PROBLEMS AS NOTED>>>  Complete Medication List: 1)  Bayer Aspirin 325 Mg Tabs (Aspirin) .... Take 1 tablet by mouth once a day 2)  Metoprolol Succinate 50 Mg Xr24h-tab (Metoprolol succinate) .... Take 1 tab by mouth once daily.Marland KitchenMarland Kitchen 3)  Zestril 10 Mg Tabs (Lisinopril) .... Take 1 tablet by mouth once a day 4)  Lasix 40 Mg Tabs (Furosemide) .... Take one daily 5)  Zocor 40 Mg Tabs (Simvastatin) .... Take 1 tablet by mouth once a day 6)  Zetia 10 Mg Tabs (Ezetimibe) .... Take 1 tablet by mouth once a day 7)  Niaspan 500 Mg Tbcr (Niacin (antihyperlipidemic)) .... Take 1 tab by mouth at bedtime 8)  Co Q-10 30 Mg Caps (Coenzyme q10) .Marland Kitchen.. 1 tab by mouth once daily 9)  Glucophage 1000 Mg Tabs (Metformin hcl) .... Take 1/2 tab by mouth two  times a day 10)  Glimepiride 4 Mg Tabs (Glimepiride) .... Take 1/2 tab by mouth once daily 11)  Januvia 100 Mg Tabs (Sitagliptin phosphate) .... Take 1/2 tablet by mouth once a day  Patient Instructions: 1)  Today we updated your med list- see below.... 2)  Continue your current meds the same... 3)  We reviewed your recent lab work by [employer]- doing well, continue meds as noted... 4)  Let's get on track w/ our diet program and increase your exercise as we discussed. 5)  Call for any questions.Marland KitchenMarland Kitchen 6)  Please schedule a follow-up appointment in 6 months.   Immunization History:  Influenza Immunization History:    Influenza:  historical (12/23/2008)

## 2010-04-13 NOTE — Assessment & Plan Note (Signed)
Summary: 6 months/apc   CC:  6 month ROV & review of mult medical problems....  History of Present Illness: 75 y/o WM here for a follow up visit...  he has multiple medical problems as noted below...  he gets his meds from [facility]...   ~  3/09 his routine PSA was up at 4.89... sent to Urology and seen by DrDahlstedt w/ asymtomatic UTI found and treated w/ antibiotics... repeat PSA was 2.10.  ~  5/09 saw TP w/ neck pain... XRays showed DDD C5-6 & C6-7... treated w/ anti-inflamm and Skelaxin w/ improvement...  ~  6/09 had cyst removed from the left post neck by DrCornett as outpt...   ~  November 10, 2008:  another good 6 months without new complaints or concerns... unfortunately he has not been dieting or exercising & his weight is unchanged... BP, Chol, & BS have been well controlled on meds- not checking BS at home...   ~  May 11, 2009:  Cards f/u 11/10 w/ Myoview that was abnormal- subseq Cath showed 2/4 grafts occluded & med Rx recommended w/ Lasix & K20 started (improved dyspnea)...  his BP is controlled;  denies angina, SOB, edema;  Chol has been stable on meds;  sugars OK on med Rx w/ home BS  ~90-120 by his report...   Current Problem List:  BRONCHITIS, ACUTE WITH MILD BRONCHOSPASM (ICD-466.0) - he is an ex-smoker, quit in the 1970's... he denies cough, sputum, hemoptysis, worsening dyspnea, wheezing, chest pains, snoring, daytime hypersomnolence, etc...   ~  CXR 3/10 showed NAD- s/p med sternotomy, borderline hrt size, etc...  ~  CXR 11/10 in hosp showed s/p CABG, low lung vols, basilar atx, NAD...  PAROTID LESION, UNSPECIFIED (ICD-239.0) - hx of bilat parotid hypertrophy without nodules... he states normal saliva, no dry mouth etc...  HYPERTENSION (ICD-401.9) - controlled on TOPROLXL 50mg /d, ZESTRIL 10mg /d, LASIX 40mg /d & KCl 65mEq/d... BP= 112/70 and doing well denies HA, fatigue, visual changes, CP, palipit, dizziness, syncope, dyspnea, edema, etc... still not checking BP's at  home.  C A D - S/P C A B G (ICD-414.9) - he had 4 vessel CABG in 12/05... he has left pleural effusion post-CABG... followed by Aletta Edouard for cardiology on the above meds + ASA daily... working on secondary risk factor reductions... min exercise w/ yard work and we discussed the need for daily ex indoors... DrCrenshaws notes reviewed>> Diastolic CHF treated w/ Lasix/ KCl.  ~  Baseline EKG showed NSR, NSSTTWA, NAD...  ~  Myoview 11/10 showed scar inferiorly w/ ischemia superimposed... mild HK inferiorly w/ EF=63%...  ~  Cath 11/10 showed diffuse 50% proxLAD w/ 100% occlusion in mid portion (distal filled via left IM graft), mild plaque in CIRC w/ marginal branches occluded at the ostia, 100% prox RCA- PDA fills from collateral flow>> 2/4 grafts occluded...  Hx of PAROXYSMAL ATRIAL FIBRILLATION (ICD-427.31) - no CP, palpit, dizzy, etc...  PERIPHERAL VASCULAR DISEASE (ICD-443.9)  HYPERLIPIDEMIA (ICD-272.4) - on SIMVASTATIN 40mg /d, ZETIA 10mg /d, NIASPAN 500mg /d...   ~  FLP 7/08 showed TChol 113, TG 105, HDL 32, LDL 60  ~  FLP 3/09 showed TChol 132, TG 151, HDL 35, LDL 67  ~  FLP 8/09 showed TChol 95, TG 89, HDL 30, LDL 47... rec- continue same.  ~  FLP 3/10 showed TChol 119, TG 84, HDL 37, LDL 65  ~  FLP 8/10 showed TChol 115, TG 92, HDL 37, LDL 59  ~  FLP 2/11 showed TChol 117, TG 101, HDL 43, LDL  54  DIABETES MELLITUS (ICD-250.00) - on diet + METFORMIN 1000mg tabs- 1/2 tabBid, GLIMEPIRIDE 4mg tabs- 1/2 tab/d, ACTOS 30- 1/2 tab daily,  & JANUVIA 100mg - 1/2 tab daily... states home checks all 90-120...  ~  labs 7/08 showed FBS=94and HgA1c= 6.2  ~  labs 3/09 showed BS= 141, HgA1c= 6.9  ~  labs 8/09 showed BS= 106, HgA1c= 6.3.Marland KitchenMarland Kitchen rec- continue the same, get weight down!  ~  labs 3/10 showed BS= 83, A1c= 6.5  ~  labs 8/10 showed BS= 72, A1c= 6.1.Marland Kitchen. let's try to save some $$ by decr the Actos & Januvia to 1/2/d...  ~  labs 2/11 showed BS= 101, A1c= 6.1.Marland KitchenMarland Kitchen OK to stop the Actos.  DIVERTICULOSIS  (ICD-562.10) - flex sig 1997 by DrStark showed divertics, int hem...  ~  3/10: referred back to DrStark for colonoscopy- done 06/04/08 & showed divertics, hem, otherw neg... f/u rec Prn.  POLYNEUROPATHY (ICD-357.2) - diabetic vs other (he was in [place] w/ exposure there)...  Hx of STROKE (ICD-434.91) - hosp in 2006 w/ MRI showing sm vessel dis, old left thalamic infarct, & atherosclerotic changes on MRA as well... he continues on ASA 81mg /d...  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 no problems in recent yrs.  DEGENERATIVE JOINT DISEASE (ICD-715.90) - on TRAMADOL 50mg  Prn...  Hx of ANEMIA / OTHER (ICD-285.9)  ~  labs 2/11 showed Hg= 13.5   Allergies (verified): No Known Drug Allergies  Comments:  Nurse/Medical Assistant: The patient's medications and allergies were reviewed with the patient and were updated in the Medication and Allergy Lists.  Past History:  Past Medical History:  HYPERTENSION (ICD-401.9) C A D - S/P C A B G (ICD-414.9) Hx of postoperative ATRIAL FIBRILLATION (ICD-427.31) PERIPHERAL VASCULAR DISEASE (ICD-443.9) Hx of STROKE (ICD-434.91) HYPERLIPIDEMIA (ICD-272.4) PAROTID LESION, UNSPECIFIED (ICD-239.0) DIABETES MELLITUS (ICD-250.00) DIVERTICULOSIS (ICD-562.10) POLYNEUROPATHY-DIABETIC (ICD-357.2) SEIZURE-PARTIAL COMPLEX (ICD-345.40) DEGENERATIVE JOINT DISEASE (ICD-715.90)  Past Surgical History: S/P coronary artery bypass graft S/P removal of sebaceous cyst from neck 6/09 by DrCornett  Family History: Reviewed history from 11/12/2007 and no changes required. mother deceased age 55 from stroke father deceased age 64  Social History: Reviewed history from 11/12/2007 and no changes required. Married quit smoking in 1979 exercises occasionaly caffeine use:  2 cups per day no etoh 3 children  Review of Systems      See HPI       The patient complains of dyspnea on exertion.  The patient  denies anorexia, fever, weight loss, weight gain, vision loss, decreased hearing, hoarseness, chest pain, syncope, peripheral edema, prolonged cough, headaches, hemoptysis, abdominal pain, melena, hematochezia, severe indigestion/heartburn, hematuria, incontinence, muscle weakness, suspicious skin lesions, transient blindness, difficulty walking, depression, unusual weight change, abnormal bleeding, enlarged lymph nodes, and angioedema.    Vital Signs:  Patient profile:   75 year old male Height:      63 inches Weight:      199.13 pounds O2 Sat:      97 % on Room air Temp:     97.4 degrees F oral Pulse rate:   66 / minute BP sitting:   112 / 70  (left arm) Cuff size:   regular  Vitals Entered By: Randell Loop CMA (May 11, 2009 9:46 AM)  O2 Sat at Rest %:  97 O2 Flow:  Room air CC: 6 month ROV & review of mult medical problems... Is Patient Diabetic? Yes Pain Assessment Patient in pain? no  Comments no changes in meds today   Physical Exam  Additional Exam:  WD, WN, 75 y/o WM in NAD... GENERAL:  Alert & oriented; pleasant & cooperative... HEENT:  [place]/AT, EOM-wnl, PERRLA, EACs-clear, TMs-wnl, NOSE-clear, THROAT-clear & wnl. NECK:  Supple w/ fairROM; no JVD; normal carotid impulses w/o bruits; no thyromegaly or nodules palpated; no lymphadenopathy. CHEST:  Clear to P & A... no rales or signs of consolidation... HEART:  Regular Rhythm; without murmurs/ rubs/ or gallops heard... ABDOMEN:  Soft & nontender; normal bowel sounds; no organomegaly or masses detected. EXT: without deformities, mild arthritic changes; no varicose veins/ +venous insuffic/ no edema. NEURO:  CN's intact; no focal neuro deficits... DERM:  No lesions noted; no rash etc...    MISC. Report  Procedure date:  05/11/2009  Findings:      Lipid Panel (LIPID)   Cholesterol               117 mg/dL                   1-610   Triglycerides             101.0 mg/dL                 9.6-045.4   HDL                        42.60 mg/dL                 >09.81   LDL Cholesterol           54 mg/dL                    1-91  BMP (METABOL)   Sodium                    142 mEq/L                   135-145   Potassium                 5.1 mEq/L                   3.5-5.1   Chloride                  108 mEq/L                   96-112   Carbon Dioxide            30 mEq/L                    19-32   Glucose              [H]  101 mg/dL                   47-82   BUN                  [H]  32 mg/dL                    9-56   Creatinine                1.4 mg/dL                   2.1-3.0   Calcium  9.7 mg/dL                   9.1-47.8   GFR                       52.93 mL/min                >60  Hepatic/Liver Function Panel (HEPATIC)   Total Bilirubin           0.7 mg/dL                   2.9-5.6   Direct Bilirubin          0.1 mg/dL                   2.1-3.0   Alkaline Phosphatase      43 U/L                      39-117   AST                       20 U/L                      0-37   ALT                       18 U/L                      0-53   Total Protein             7.4 g/dL                    8.6-5.7   Albumin                   4.1 g/dL                    8.4-6.9  Comments:      CBC Platelet w/Diff (CBCD)   White Cell Count          5.5 K/uL                    4.5-10.5   Red Cell Count       [L]  4.20 Mil/uL                 4.22-5.81   Hemoglobin                13.5 g/dL                   62.9-52.8   Hematocrit                40.2 %                      39.0-52.0   MCV                       95.6 fl                     78.0-100.0   Platelet Count            200.0 K/uL                  150.0-400.0   Neutrophil %  46.6 %                      43.0-77.0   Lymphocyte %              39.6 %                      12.0-46.0   Monocyte %                11.1 %                      3.0-12.0   Eosinophils%              1.9 %                       0.0-5.0   Basophils %               0.8 %                        0.0-3.0  TSH (TSH)   FastTSH                   1.27 uIU/mL                 0.35-5.50  Hemoglobin A1C (A1C)   Hemoglobin A1C            6.1 %                       4.6-6.5   Impression & Recommendations:  Problem # 1:  HYPERTENSION (ICD-401.9) Controlled on meds-  continue same. His updated medication list for this problem includes:    Metoprolol Succinate 50 Mg Xr24h-tab (Metoprolol succinate) .Marland Kitchen... Take 1 tab by mouth once daily...    Zestril 10 Mg Tabs (Lisinopril) .Marland Kitchen... Take 1 tablet by mouth once a day    Lasix 40 Mg Tabs (Furosemide) .Marland Kitchen... Take one daily  Orders: TLB-Lipid Panel (80061-LIPID) TLB-BMP (Basic Metabolic Panel-BMET) (80048-METABOL) TLB-Hepatic/Liver Function Pnl (80076-HEPATIC) TLB-CBC Platelet - w/Differential (85025-CBCD) TLB-TSH (Thyroid Stimulating Hormone) (84443-TSH) TLB-A1C / Hgb A1C (Glycohemoglobin) (83036-A1C) TLB-Microalbumin/Creat Ratio, Urine (82043-MALB)  Problem # 2:  C A D - S/P C A B G (ICD-414.9) Followed by Aletta Edouard-  seen recently & meds adjusted on Lasix40 & K20... His updated medication list for this problem includes:    Bayer Aspirin 325 Mg Tabs (Aspirin) .Marland Kitchen... Take 1 tablet by mouth once a day    Metoprolol Succinate 50 Mg Xr24h-tab (Metoprolol succinate) .Marland Kitchen... Take 1 tab by mouth once daily...    Zestril 10 Mg Tabs (Lisinopril) .Marland Kitchen... Take 1 tablet by mouth once a day    Lasix 40 Mg Tabs (Furosemide) .Marland Kitchen... Take one daily  Problem # 3:  HYPERLIPIDEMIA (ICD-272.4) Doing satis on these meds... continue same + better diet. His updated medication list for this problem includes:    Zocor 40 Mg Tabs (Simvastatin) .Marland Kitchen... Take 1 tablet by mouth once a day    Zetia 10 Mg Tabs (Ezetimibe) .Marland Kitchen... Take 1 tablet by mouth once a day    Niaspan 500 Mg Tbcr (Niacin (antihyperlipidemic)) .Marland Kitchen... Take 1 tab by mouth at bedtime  Problem # 4:  DIABETES MELLITUS (ICD-250.00) His BS & A1c are stable as we have been weaning down on his meds...  REC> stop the Actos, continue the other 3 meds. The following medications  were removed from the medication list:    Actos 30 Mg Tabs (Pioglitazone hcl) .Marland Kitchen... 1/2  tablet by mouth once a day His updated medication list for this problem includes:    Bayer Aspirin 325 Mg Tabs (Aspirin) .Marland Kitchen... Take 1 tablet by mouth once a day    Zestril 10 Mg Tabs (Lisinopril) .Marland Kitchen... Take 1 tablet by mouth once a day    Glucophage 1000 Mg Tabs (Metformin hcl) .Marland Kitchen... Take 1/2 tab by mouth two times a day    Glimepiride 4 Mg Tabs (Glimepiride) .Marland Kitchen... Take 1/2 tab by mouth once daily    Januvia 100 Mg Tabs (Sitagliptin phosphate) .Marland Kitchen... Take 1/2 tablet by mouth once a day  Problem # 5:  OTHER MEDICAL PROBLEMS AS NOTED>>>  Complete Medication List: 1)  Bayer Aspirin 325 Mg Tabs (Aspirin) .... Take 1 tablet by mouth once a day 2)  Metoprolol Succinate 50 Mg Xr24h-tab (Metoprolol succinate) .... Take 1 tab by mouth once daily.Marland KitchenMarland Kitchen 3)  Zestril 10 Mg Tabs (Lisinopril) .... Take 1 tablet by mouth once a day 4)  Lasix 40 Mg Tabs (Furosemide) .... Take one daily 5)  K-lor 20 Meq Pack (Potassium chloride) .... Take one daily 6)  Zocor 40 Mg Tabs (Simvastatin) .... Take 1 tablet by mouth once a day 7)  Zetia 10 Mg Tabs (Ezetimibe) .... Take 1 tablet by mouth once a day 8)  Niaspan 500 Mg Tbcr (Niacin (antihyperlipidemic)) .... Take 1 tab by mouth at bedtime 9)  Co Q-10 30 Mg Caps (Coenzyme q10) .Marland Kitchen.. 1 tab by mouth once daily 10)  Glucophage 1000 Mg Tabs (Metformin hcl) .... Take 1/2 tab by mouth two times a day 11)  Glimepiride 4 Mg Tabs (Glimepiride) .... Take 1/2 tab by mouth once daily 12)  Januvia 100 Mg Tabs (Sitagliptin phosphate) .... Take 1/2 tablet by mouth once a day 13)  Tramadol Hcl 50 Mg Tabs (Tramadol hcl) .Marland Kitchen.. 1-2 by mouth every 4 hr as needed. 14)  Flax Oil (Flaxseed (linseed)) .Marland Kitchen.. 1 tab by mouth once daily 15)  Oracea 40 Mg Cpdr (Doxycycline (rosacea)) .... As directed  Other Orders: Tdap => 76yrs IM  ([address]) Admin 1st Vaccine (60454)  Patient Instructions: 1)  Today we updated your med list- see below.... 2)  Let us know if you need any refills- we can do this electronically for your convenience... 3)  Today we did your follow up FASTING blood work... please call the "phone tree" in a few days for your lab results.Marland KitchenMarland Kitchen 4)  Today we gave you the combined TETANUS vaccine called the TDAP... it should be good for 75yrs.Marland KitchenMarland Kitchen 5)  Let's get in track w/ our diet + exercise... the goal is to lose 15-20 lbs!!! 6)  Call for any problems.Marland KitchenMarland Kitchen 7)  Please schedule a follow-up appointment in 6 months.   Immunizations Administered:  Tetanus Vaccine:    Vaccine Type: Tdap    Site: right deltoid    Mfr: [email or web address]    Dose: 0.5 ml    Route: IM    Given by: Randell Loop CMA    Exp. Date: 05/09/2011    Lot #: [id number]    VIS given: 01/30/07 version given May 11, 2009.

## 2010-04-13 NOTE — Progress Notes (Signed)
Summary: RX REQ TODAY  Phone Note Call from Patient Call back at Home Phone [phone]   Caller: [facility] Call For: NADEL Summary of Call: WANTS TO PICK UP RX FOR NIACIN 500mg - 90 DAYS SU[facility]PLY [facility] WANTS TO PICK UP BY 3:30 TODAY.  Initial call taken by: Tivis Ringer, CNA,  February 02, 2010 9:56 AM  Follow-up for Phone Call        Spoke with pt granddaughter and she staets pt wants rx printed to take to [facility]. Rx pritned and palced oN SN look-at to sign. I will place at fronty once signed. Carron Curie CMA  February 02, 2010 10:10 AM   Additional Follow-up for Phone Call Additional follow up Details #1::        called and spoke with mindy and she is aware that rx has been signed by [facility] and is up front and ready to be picked up Randell Loop CMA  February 02, 2010 11:11 AM     Prescriptions: NIASPAN 500 MG  TBCR (NIACIN (ANTIHYPERLIPIDEMIC)) Take 1 tab by mouth at bedtime  #90 x 3   Entered by:   Carron Curie CMA   Authorized by:   Michele Mcalpine MD   Signed by:   Carron Curie CMA on 02/02/2010   Method used:   Print then Give to Patient   RxID:   [id number]

## 2010-04-13 NOTE — Progress Notes (Signed)
Summary: refills on simvastatin and metformin   Phone Note Call from Patient   Caller: Spouse//shirley Lykens Call For: nadel Summary of Call: simdastatin 40mg , metformin 1000mg //[facility] [place], [phone] Initial call taken by: Darletta Moll,  May 05, 2009 11:47 AM  Follow-up for Phone Call        printed rx and gave to Leigh to have SN sign.  Pt will need to pick up these rx to take to  [facility] to have meds filled.  pt aware of this.   Aundra Millet Reynolds LPN  May 05, 2009 11:57 AM     Prescriptions: GLUCOPHAGE 1000 MG  TABS (METFORMIN HCL) take 1/2 tab by mouth two times a day  #90 x 3   Entered by:   Arman Filter LPN   Authorized by:   Michele Mcalpine MD   Signed by:   Arman Filter LPN on 47/82/9562   Method used:   Print then Give to Patient   RxID:   [id number] ZOCOR 40 MG  TABS (SIMVASTATIN) Take 1 tablet by mouth once a day  #90 x 3   Entered by:   Arman Filter LPN   Authorized by:   Michele Mcalpine MD   Signed by:   Arman Filter LPN on 84/13/2440   Method used:   Print then Give to Patient   RxID:   [id number]

## 2010-04-13 NOTE — Progress Notes (Signed)
Summary: PRESCRIPT-LMTCBx2  Phone Note Call from Patient   Caller: Spouse SHIRLEY Call For: NADEL Summary of Call: PT REQUEST 3 MONTH SUPPLY OF ZETIA [facility] Initial call taken by: Rickard Patience,  Aug 05, 2009 8:42 AM  Follow-up for Phone Call        rx was printed on 08-03-09 for zetia per pt requests, what did they do with this rx? LMTCB.Carron Curie CMA  Aug 05, 2009 9:11 AM  LMTCBx2. Carron Curie CMA  Aug 06, 2009 12:08 PM  Returning your phone call.Darletta Moll  Aug 06, 2009 1:38 PM    Additional Follow-up for Phone Call Additional follow up Details #1::        Spoke with pt's spouse.  She states that they took rx for this to [place] and somehow it was misplaced.  She is requesting just a 90 day supply sent to pharm until they go back to [place].  [facility]. Additional Follow-up by: Vernie Murders,  Aug 06, 2009 1:41 PM    Prescriptions: ZETIA 10 MG  TABS (EZETIMIBE) Take 1 tablet by mouth once a day  #90 x 0   Entered by:   Vernie Murders   Authorized by:   Michele Mcalpine MD   Signed by:   Vernie Murders on 08/06/2009   Method used:   [facility]  [place] [address]       [address]       [place], [place]  [address]       Ph: [phone] or [phone]       Fax: [phone]   RxID:   [id number]

## 2010-05-07 ENCOUNTER — Telehealth: Payer: Self-pay | Admitting: Pulmonary Disease

## 2010-05-10 ENCOUNTER — Encounter: Payer: Self-pay | Admitting: Pulmonary Disease

## 2010-05-10 ENCOUNTER — Other Ambulatory Visit: Payer: Medicare Other

## 2010-05-10 ENCOUNTER — Ambulatory Visit (INDEPENDENT_AMBULATORY_CARE_PROVIDER_SITE_OTHER): Payer: Medicare Other | Admitting: Pulmonary Disease

## 2010-05-10 ENCOUNTER — Other Ambulatory Visit: Payer: Self-pay | Admitting: Pulmonary Disease

## 2010-05-10 DIAGNOSIS — I259 Chronic ischemic heart disease, unspecified: Secondary | ICD-10-CM

## 2010-05-10 DIAGNOSIS — K5730 Diverticulosis of large intestine without perforation or abscess without bleeding: Secondary | ICD-10-CM

## 2010-05-10 DIAGNOSIS — I10 Essential (primary) hypertension: Secondary | ICD-10-CM

## 2010-05-10 DIAGNOSIS — E785 Hyperlipidemia, unspecified: Secondary | ICD-10-CM

## 2010-05-10 DIAGNOSIS — Z125 Encounter for screening for malignant neoplasm of prostate: Secondary | ICD-10-CM

## 2010-05-10 DIAGNOSIS — I6350 Cerebral infarction due to unspecified occlusion or stenosis of unspecified cerebral artery: Secondary | ICD-10-CM

## 2010-05-10 DIAGNOSIS — E119 Type 2 diabetes mellitus without complications: Secondary | ICD-10-CM

## 2010-05-10 DIAGNOSIS — I739 Peripheral vascular disease, unspecified: Secondary | ICD-10-CM

## 2010-05-10 DIAGNOSIS — I4891 Unspecified atrial fibrillation: Secondary | ICD-10-CM

## 2010-05-10 DIAGNOSIS — E876 Hypokalemia: Secondary | ICD-10-CM

## 2010-05-10 DIAGNOSIS — D649 Anemia, unspecified: Secondary | ICD-10-CM

## 2010-05-10 DIAGNOSIS — I509 Heart failure, unspecified: Secondary | ICD-10-CM

## 2010-05-10 LAB — CBC WITH DIFFERENTIAL/PLATELET
Basophils Absolute: 0 10*3/uL (ref 0.0–0.1)
Hemoglobin: 14.3 g/dL (ref 13.0–17.0)
Lymphocytes Relative: 37.9 % (ref 12.0–46.0)
Monocytes Relative: 10.4 % (ref 3.0–12.0)
Neutro Abs: 3.3 10*3/uL (ref 1.4–7.7)
RDW: 13 % (ref 11.5–14.6)
WBC: 6.7 10*3/uL (ref 4.5–10.5)

## 2010-05-10 LAB — LIPID PANEL
Cholesterol: 115 mg/dL (ref 0–200)
HDL: 33.5 mg/dL — ABNORMAL LOW (ref >39.00)
VLDL: 22 mg/dL (ref 0.0–40.0)

## 2010-05-10 LAB — BASIC METABOLIC PANEL
BUN: 16 mg/dL (ref 6–23)
CO2: 30 mEq/L (ref 19–32)
Calcium: 9.2 mg/dL (ref 8.4–10.5)
Creatinine, Ser: 1 mg/dL (ref 0.4–1.5)
Glucose, Bld: 133 mg/dL — ABNORMAL HIGH (ref 70–99)
Sodium: 141 mEq/L (ref 135–145)

## 2010-05-10 LAB — HEPATIC FUNCTION PANEL
Albumin: 3.9 g/dL (ref 3.5–5.2)
Alkaline Phosphatase: 50 U/L (ref 39–117)
Total Protein: 6.8 g/dL (ref 6.0–8.3)

## 2010-05-10 LAB — MICROALBUMIN / CREATININE URINE RATIO: Creatinine,U: 97.1 mg/dL

## 2010-05-10 LAB — TSH: TSH: 0.79 u[IU]/mL (ref 0.35–5.50)

## 2010-05-11 NOTE — Progress Notes (Signed)
Summary: written rx  Phone Note Call from Patient Call back at Home Phone [phone]   Caller: Patient Call For: Fred [name] Reason for Call: Talk to Nurse Summary of Call: Patient gets [facility] med[facility] from [facility].  He is need written rx's 90 days supply x3 refills.  glucophage(metsormin) 1000mg  and zocor(simvastatin) 40 mg.  Patient will pick up Monday at his appt. Initial call taken by: Lehman Prom,  May 07, 2010 8:20 AM  Follow-up for Phone Call        called and spoke with pt's spouse.  spouse requesting 90 day rx x 3 refills to take with her to [facility].  printed rx and put on SN's cart for him to sign.  Arman Filter LPN  May 07, 2010 9:13 AM   Additional Follow-up for Phone Call Additional follow up Details #1::        rx printed out and signed by Sn---pt will pick up on monday Randell Loop CMA  May 07, 2010 10:18 AM     Prescriptions: GLUCOPHAGE 1000 MG  TABS (METFORMIN HCL) take 1/2 tab by mouth two times a day  #90 x 3   Entered by:   Arman Filter LPN   Authorized by:   Michele Mcalpine MD   Signed by:   Arman Filter LPN on 44/05/4740   Method used:   Print then Give to Patient   RxID:   [id number] ZOCOR 40 MG  TABS (SIMVASTATIN) Take 1 tablet by mouth once a day  #90 x 3   Entered by:   Arman Filter LPN   Authorized by:   Michele Mcalpine MD   Signed by:   Arman Filter LPN on 88/41/6606   Method used:   Print then Give to Patient   RxID:   [id number]

## 2010-05-17 ENCOUNTER — Telehealth: Payer: Self-pay | Admitting: Pulmonary Disease

## 2010-05-25 NOTE — Progress Notes (Signed)
Summary: waiting on call back from pt  Phone Note Outgoing Call   Summary of Call: called and lmomtcb for pt regarding lab results per SN---uric elevated at 9.5---and he has had several gout attacks---recs are to do a gout preventative on allopurinol 300mg    1 once daily #90 with refills----need to find out what pharmacy pt wants the rx faxed to.   Randell Loop CMA  May 17, 2010 12:02 PM   Follow-up for Phone Call        patient phoned stated that he was returning a call from [facility]. He can be reached at [phone].Vedia Coffer  May 17, 2010 4:26 PM  Additional Follow-up for Phone Call Additional follow up Details #1::        called and spoke with pt and he is aware of rx faxed to the pharmacy  for the allopurinol Randell Loop CMA  May 17, 2010 4:37 PM     Prescriptions: ALLOPURINOL 300 MG TABS (ALLOPURINOL) take 1 tab by mouth once daily...  #90 x 4   Entered by:   Randell Loop CMA   Authorized by:   Michele Mcalpine MD   Signed by:   Randell Loop CMA on 05/17/2010   Method used:   [facility]        [facility]  [place] [address] [facility]       [address]       [place], [place]  [address]       Ph: [phone] or [phone]       Fax: [phone]   RxID:   [id number]

## 2010-05-25 NOTE — Assessment & Plan Note (Signed)
Summary: 6 months/apc   CC:  6 month ROV & review of mult medical problems....  History of Present Illness: 75 y/o WM here for a follow up visit...  he has multiple medical problems including HBP; CAD- s/p 4 vessel CABG in 2005; Hx PAF; Hyperlipidemia; DM on oral agents; Hx neuropathy; hx cerebrovasc dis & prev stroke w/ partial complex seizures; DJD, etc ...  he gets his meds from [facility]...    ~  May 11, 2009:  Cards f/u 11/10 w/ Myoview that was abnormal- subseq Cath showed 2/4 grafts occluded & med Rx recommended w/ Lasix & K20 started (improved dyspnea)...  his BP is controlled;  denies angina, SOB, edema;  Chol has been stable on meds;  sugars OK on med Rx w/ home BS  ~90-120 by his report...   ~  November 09, 2009:  he just had f/u DrCrenshaw for Cards- K was 5.4 & KCl discontinued, otherw stable... he did fasting labs> reviewed w/ pt & he will continue same Rx... he denies intercurrent bronchial problems, BP controlled, no CP/ palpit/ etc, Chol & BS look good on Rx...    ~  May 10, 2010:  60mo ROV- doing well overall but hasn't lost any weight & he notes intermittent ?gout in left big toe (nothing active now & we discussed checking Uric w/ Rx as necessary);  he will use Aleve for knee & foot pain;  Uric=9.5 7 rec to start allopurinol 300mg /d...    BP controlled on meds; and denies CP, palpit, SOB, edema; he is too sedentary & we discussed incr exercise perscription for Cards rehab & wt reduction;  he denies cerebral ischemic symptoms or seizures...    Lipids are well regulated w/ his 3 meds + diet & exercise as noted> FLP shows TChol=115, TG=110, HDL=34, LDL=60> continue Simva40, ZOXWR60, Niaspan500...    Similarly DM has been well controlled on his 3 meds + diet & exercise (which clearly needs to be better)> Labs today shows BS=133, A1c=7.2 and he may need to incr meds; he denies neuropathy symptoms or pain;  U microalb=neg...   Current Meds:  BAYER ASPIRIN 325 MG  TABS (ASPIRIN)  Take 1 tablet by mouth once a day METOPROLOL SUCCINATE 50 MG XR24H-TAB (METOPROLOL SUCCINATE) take 1 tab by mouth once daily... ZESTRIL 10 MG  TABS (LISINOPRIL) Take 1 tablet by mouth once a day LASIX 40 MG TABS (FUROSEMIDE) take one daily ZOCOR 40 MG  TABS (SIMVASTATIN) Take 1 tablet by mouth once a day ZETIA 10 MG  TABS (EZETIMIBE) Take 1 tablet by mouth once a day NIASPAN 500 MG  TBCR (NIACIN (ANTIHYPERLIPIDEMIC)) Take 1 tab by mouth at bedtime CO Q-10 30 MG  CAPS (COENZYME Q10) 1 tab by mouth once daily GLUCOPHAGE 1000 MG  TABS (METFORMIN HCL) take 1/2 tab by mouth two times a day GLIMEPIRIDE 4 MG  TABS (GLIMEPIRIDE) take 1/2 tab by mouth once daily JANUVIA 100 MG  TABS (SITAGLIPTIN PHOSPHATE) Take 1/2 tablet by mouth once a day    Preventive Screening-Counseling & Management  Alcohol-Tobacco     Smoking Status: quit     Packs/Day: 1      Year Started: 89     Year Quit: 1979     Pack years: 66yrs, 1ppd  Caffeine-Diet-Exercise     Exercise (avg: min/session): 6:00  Allergies (verified): No Known Drug Allergies  Comments:  Nurse/Medical Assistant: The patient's medications and allergies were reviewed with the patient and were updated in the Medication and  Allergy Lists.  Past History:  Past Medical History: PAROTID LESION, UNSPECIFIED (ICD-239.0) BRONCHITIS, ACUTE WITH MILD BRONCHOSPASM (ICD-466.0) HYPERTENSION (ICD-401.9) C A D - S/P C A B G (ICD-414.9) CHF (ICD-428.0) Hx of postoperative PAROXYSMAL ATRIAL FIBRILLATION (ICD-427.31) PERIPHERAL VASCULAR DISEASE (ICD-443.9) Hx of STROKE (ICD-434.91) HYPERLIPIDEMIA (ICD-272.4) DIABETES MELLITUS (ICD-250.00) DIVERTICULOSIS (ICD-562.10) POLYNEUROPATHY-DIABETIC (ICD-357.2) SEIZURE-PARTIAL COMPLEX (ICD-345.40) DEGENERATIVE JOINT DISEASE (ICD-715.90) NECK PAIN (ICD-723.1) Hx of ANEMIA / OTHER (ICD-285.9)  Past Surgical History: S/P coronary artery bypass graft S/P removal of sebaceous cyst from neck 6/09 by  DrCornett  Family History: Reviewed history from 11/12/2007 and no changes required. mother deceased age 74 from stroke father deceased age 51  Social History: Reviewed history from 11/09/2009 and no changes required. Married quit smoking in 1979 exercises occasionaly caffeine use:  2 cups per day no etoh 3 children  Review of Systems      See HPI       The patient complains of dyspnea on exertion and difficulty walking.  The patient denies anorexia, fever, weight loss, weight gain, vision loss, decreased hearing, hoarseness, chest pain, syncope, peripheral edema, prolonged cough, headaches, hemoptysis, abdominal pain, melena, hematochezia, severe indigestion/heartburn, hematuria, incontinence, muscle weakness, suspicious skin lesions, transient blindness, depression, unusual weight change, abnormal bleeding, enlarged lymph nodes, and angioedema.    Vital Signs:  Patient profile:   75 year old male Height:      63 inches Weight:      193.38 pounds BMI:     34.38 O2 Sat:      100 % on Room air Temp:     98.0 degrees F oral Pulse rate:   62 / minute BP sitting:   120 / 68  (left arm) Cuff size:   regular  Vitals Entered By: Randell Loop CMA (May 10, 2010 9:48 AM)  O2 Sat at Rest %:  100 O2 Flow:  Room air CC: 6 month ROV & review of mult medical problems... Is Patient Diabetic? Yes Pain Assessment Patient in pain? yes      Onset of pain  left great toe pain--??gout Comments no changes in meds today   Physical Exam  Additional Exam:  WD, WN, 75 y/o WM in NAD... GENERAL:  Alert & oriented; pleasant & cooperative... HEENT:  [place]/AT, EOM-wnl, PERRLA, EACs-clear, TMs-wnl, NOSE-clear, THROAT-clear & wnl. NECK:  Supple w/ fairROM; no JVD; normal carotid impulses w/o bruits; no thyromegaly or nodules palpated; no lymphadenopathy. CHEST:  Clear to P & A... no rales or signs of consolidation... HEART:  Regular Rhythm; without murmurs/ rubs/ or gallops heard... ABDOMEN:   Obese, soft & nontender; normal bowel sounds; no organomegaly or masses detected. EXT: without deformities, mild arthritic changes; no varicose veins/ +venous insuffic/ no edema. NEURO:  CN's intact; no focal neuro deficits... DERM:  No lesions noted; no rash etc...    Impression & Recommendations:  Problem # 1:  HYPERTENSION (ICD-401.9) Controlled on meds>  continue BBlocker, ACE, Diuretic... His updated medication list for this problem includes:    Metoprolol Succinate 50 Mg Xr24h-tab (Metoprolol succinate) .Marland Kitchen... Take 1 tab by mouth once daily...    Zestril 10 Mg Tabs (Lisinopril) .Marland Kitchen... Take 1 tablet by mouth once a day    Lasix 40 Mg Tabs (Furosemide) .Marland Kitchen... Take one daily  Orders: TLB-BMP (Basic Metabolic Panel-BMET) (80048-METABOL) TLB-Hepatic/Liver Function Pnl (80076-HEPATIC) TLB-CBC Platelet - w/Differential (85025-CBCD) TLB-Lipid Panel (80061-LIPID) TLB-TSH (Thyroid Stimulating Hormone) (84443-TSH) TLB-Uric Acid, Blood (84550-URIC) TLB-A1C / Hgb A1C (Glycohemoglobin) (83036-A1C) TLB-PSA (Prostate Specific Antigen) (84153-PSA) TLB-Microalbumin/Creat Ratio,  Urine (82043-MALB)  Problem # 2:  C A D - S/P C A B G (ICD-414.9) Stable w/o angina>  continue meds & f/u DrCrenshaw... His updated medication list for this problem includes:    Bayer Aspirin 325 Mg Tabs (Aspirin) .Marland Kitchen... Take 1 tablet by mouth once a day    Metoprolol Succinate 50 Mg Xr24h-tab (Metoprolol succinate) .Marland Kitchen... Take 1 tab by mouth once daily...    Zestril 10 Mg Tabs (Lisinopril) .Marland Kitchen... Take 1 tablet by mouth once a day    Lasix 40 Mg Tabs (Furosemide) .Marland Kitchen... Take one daily  Problem # 3:  Hx of STROKE (ICD-434.91) Hx cerebrovasc dis & prev left thalamic infact & atherosclerotic changes on MRA... Continue ASA Rx & secondary risk factor reduction strategy... His updated medication list for this problem includes:    Bayer Aspirin 325 Mg Tabs (Aspirin) .Marland Kitchen... Take 1 tablet by mouth once a day  Problem # 4:   HYPERLIPIDEMIA (ICD-272.4) Stable on his 3 med regimen from [facility]... His updated medication list for this problem includes:    Zocor 40 Mg Tabs (Simvastatin) .Marland Kitchen... Take 1 tablet by mouth once a day    Zetia 10 Mg Tabs (Ezetimibe) .Marland Kitchen... Take 1 tablet by mouth once a day    Niaspan 500 Mg Tbcr (Niacin (antihyperlipidemic)) .Marland Kitchen... Take 1 tab by mouth at bedtime  Problem # 5:  DIABETES MELLITUS (ICD-250.00) His control is worse w/ A1c up to 7.2>  may need incr meds but for now needs to double effort ast diet, exercise, wt reduction... His updated medication list for this problem includes:    Bayer Aspirin 325 Mg Tabs (Aspirin) .Marland Kitchen... Take 1 tablet by mouth once a day    Zestril 10 Mg Tabs (Lisinopril) .Marland Kitchen... Take 1 tablet by mouth once a day    Glucophage 1000 Mg Tabs (Metformin hcl) .Marland Kitchen... Take 1/2 tab by mouth two times a day    Glimepiride 4 Mg Tabs (Glimepiride) .Marland Kitchen... Take 1/2 tab by mouth once daily    Januvia 100 Mg Tabs (Sitagliptin phosphate) .Marland Kitchen... Take 1/2 tablet by mouth once a day  Problem # 6:  DEGENERATIVE JOINT DISEASE (ICD-715.90) We checked Uric=9.5 & rec to start Allopurinol 300mg /d... he will use OTC Aleve Prn... His updated medication list for this problem includes:    Bayer Aspirin 325 Mg Tabs (Aspirin) .Marland Kitchen... Take 1 tablet by mouth once a day  Problem # 7:  OTHER MEDICAL PROBLEMS AS NOTED>>>  Complete Medication List: 1)  Bayer Aspirin 325 Mg Tabs (Aspirin) .... Take 1 tablet by mouth once a day 2)  Metoprolol Succinate 50 Mg Xr24h-tab (Metoprolol succinate) .... Take 1 tab by mouth once daily.Marland KitchenMarland Kitchen 3)  Zestril 10 Mg Tabs (Lisinopril) .... Take 1 tablet by mouth once a day 4)  Lasix 40 Mg Tabs (Furosemide) .... Take one daily 5)  Zocor 40 Mg Tabs (Simvastatin) .... Take 1 tablet by mouth once a day 6)  Zetia 10 Mg Tabs (Ezetimibe) .... Take 1 tablet by mouth once a day 7)  Niaspan 500 Mg Tbcr (Niacin (antihyperlipidemic)) .... Take 1 tab by mouth at bedtime 8)  Co Q-10 30  Mg Caps (Coenzyme q10) .Marland Kitchen.. 1 tab by mouth once daily 9)  Glucophage 1000 Mg Tabs (Metformin hcl) .... Take 1/2 tab by mouth two times a day 10)  Glimepiride 4 Mg Tabs (Glimepiride) .... Take 1/2 tab by mouth once daily 11)  Januvia 100 Mg Tabs (Sitagliptin phosphate) .... Take 1/2 tablet by mouth once  a day 12)  Allopurinol 300 Mg Tabs (Allopurinol) .... Take 1 tab by mouth once daily...  Patient Instructions: 1)  Today we updated your med list- see below.... 2)  Continue your current meds the same... 3)  Today we did your follow up FASTING blood work... please call the "phone tree" in a few days for your lab results.Marland KitchenMarland Kitchen 4)  Let's get on track w/ our diet & exercise program... the goal is to lose 10-15 lbs!!! 5)  Call for any questions.Marland KitchenMarland Kitchen 6)  Please schedule a follow-up appointment in 6 months. Prescriptions: ALLOPURINOL 300 MG TABS (ALLOPURINOL) take 1 tab by mouth once daily...  #90 x 4   Entered and Authorized by:   Michele Mcalpine MD   Signed by:   Michele Mcalpine MD on 05/16/2010   Method used:   Print then Give to Patient   RxID:   [id number]    Immunization History:  Influenza Immunization History:    Influenza:  historical (01/25/2010)

## 2010-07-27 NOTE — Assessment & Plan Note (Signed)
[facility]                            CARDIOLOGY OFFICE NOTE   Fred [name], Fred [name]                     MRN:          [id number]  DATE:01/29/2008                            DOB:          [birth year]    [name] is a pleasant gentleman who has a history of coronary artery  disease status post coronary artery bypass graft in December 2005.  Since I last saw him, he is doing well symptomatically.  There is no  chest pain, palpitations, dyspnea, or pedal edema.   MEDICATIONS:  1. Aspirin 325 mg p.o. daily.  2. HCTZ 25 mg p.o. daily.  3. Zocor 40 mg p.o. daily.  4. Zetia 10 mg p.o. daily.  5. Zestril 10 mg p.o. daily.  6. Januvia 100 mg p.o. daily.  7. Actos 30 mg p.o. daily.  8. Glucophage 500 mg p.o. b.i.d.  9. Glimepiride 2 mg p.o. daily.  10.Niaspan 500 mg p.o. daily.   PHYSICAL EXAMINATION:  VITAL SIGNS:  A blood pressure of 124/70 and the  pulse is 83.  HEENT:  Normal.  NECK:  Supple and I cannot appreciate bruits.  CHEST:  Clear.  CARDIOVASCULAR:  Regular rate.  ABDOMEN:  No tenderness.  There is no pulsatile mass and no bruit.  EXTREMITIES:  No edema.   His electrocardiogram shows a sinus rhythm at a rate of 83.  There is  left axis deviation.  There are no ST changes noted.   DIAGNOSES:  1. Coronary artery disease status post coronary artery bypass graft -      Fred [name] is not having chest pain or shortness of breath.  He      will continue on his aspirin, statin, and ACE inhibitor.  When he      returns in 1 year, I will most likely proceed with a Myoview as it      will have been 5 years since his bypass surgery.  He will continue      with diet and exercise.  He does not smoke.  2. Hyperlipidemia - he will continue on his present medications and      Dr. Kriste Basque is following his lipids and liver.  3. Hypertension - his blood pressure is adequately controlled on his      present medications.  Note, a recent BMET showed an  elevated BUN.      I have asked him to decrease his HCTZ from 25-12.5 mg p.o. daily      and we will check a BMET in 1 week to follow his potassium and      renal function.  4. Diabetes mellitus - management per Dr. Kriste Basque.     Fred Frieze Jens Som, MD, [facility]  Electronically Signed    BSC/MedQ  DD: 01/29/2008  DT: 01/29/2008  Job #: [id number]

## 2010-07-27 NOTE — Assessment & Plan Note (Signed)
[facility]                            CARDIOLOGY OFFICE NOTE   ATHA, MCBAIN                     MRN:          [id number]  DATE:01/23/2007                            DOB:          [birth year]    SUBJECTIVE:  Mr. Brosnahan is a very pleasant gentleman who has a history  of coronary artery disease.  He is 75 years old and is status post  coronary artery bypass graft surgery in December 2005.  At that time, he  had a LIMA to the LAD, a left radial  artery to the PDA, saphenous vein  graft to the OM-I and a saphenous vein graft to the OM-II.  Since I last  saw him, he is doing well.  He denies any dyspnea on exertion,  orthopnea, PND, pedal edema, palpitations, pre-syncope, syncope or chest  pain.   CURRENT MEDICATIONS:  1. Aspirin 325 mg p.o. daily.  2. Hydrochlorothiazide 25 mg p.o. daily.  3. Toprol 50 mg p.o. daily.  4. Zocor 40 mg p.o. daily.  5. Zetia 10 mg p.o. daily.  6. Zestril 10 mg p.o. daily.  7. Januvia 100 mg p.o. daily.  8. Actos 30 mg p.o. daily.  9. Glucophage.  10.Glimepiride.  11.Niaspan 500 mg p.o. daily.   PHYSICAL EXAMINATION:  VITAL SIGNS:  Blood pressure 137/79, pulse 69,  weight 202 pounds.  HEENT:  Normal.  NECK:  Supple and there are no bruits noted.  CHEST:  Clear.  CARDIOVASCULAR:  A regular rate and rhythm.  ABDOMEN:  No tenderness.  EXTREMITIES:  No edema.   Electrocardiogram shows a sinus rhythm with a left axis deviation.  There are no ST changes noted.   DIAGNOSES/PLAN:  1. Coronary artery disease, status post coronary artery bypass graft      surgery:  The patient has had no chest pain or shortness of breath.      We will continue with medical therapy, including his aspirin, a      statin, beta blocker and ACE inhibitor.  2. Hyperlipidemia:  His most recent lipids and liver were checked in      July and were outstanding.  Note that his BUN and creatinine and      potassium were normal at that time  as well.  We will continue with      his present dose of statin and diet as well.  3. Diabetes mellitus:  Per Dr. Lonzo Cloud. Nadel.  4. Hypertension:  His blood pressure is adequately controlled on his      present medications.   We discussed risk factor modification today, including diet and  exercise.  He does not smoke.   FOLLOWUP:  I will see him back in one year.     Madolyn Frieze Jens Som, MD, [facility]  Electronically Signed    BSC/MedQ  DD: 01/23/2007  DT: 01/24/2007  Job #: [id number]

## 2010-07-27 NOTE — Op Note (Signed)
NAME:  Fred [name], Fred [name]              ACCOUNT NO.:  [id number]   MEDICAL RECORD NO.:  [id number]          PATIENT TYPE:  AMB   LOCATION:  DSC                          FACILITY:  [facility]   PHYSICIAN:  Thomas A. Cornett, M.D.DATE OF BIRTH:  [birth year]   DATE OF PROCEDURE:  09/12/2007  DATE OF DISCHARGE:                               OPERATIVE REPORT   PREOPERATIVE DIAGNOSIS:  Mass, posterior neck, measuring 3 x 4 cm.   POSTOPERATIVE DIAGNOSIS:  Mass, posterior neck, measuring 3 x 4 cm.   PROCEDURE:  Excision of mass, posterior neck, measuring 3 x 4 cm.   SURGEON:  Maisie Fus A. Cornett, MD   ASSISTANT:  Lin Givens, PA student.   ANESTHESIA:  MAC with 0.25% Sensorcaine with epinephrine local.   ESTIMATED BLOOD LOSS:  5 mL.   SPECIMEN:  3 x 4 cm lobulated fatty mass to pathology.   DRAINS:  None.   INDICATIONS FOR PROCEDURE:  The patient is a 75 year old male with a  slowly growing mass in the posterior aspect of his neck.  He wished to  have it removed due to discomfort who presents today for it.   DESCRIPTION OF PROCEDURE:  The patient was brought to the operating room  and placed prone.  Posterior neck was prepped and draped in sterile  fashion.  The site was marked preoperatively.  A linear incision was  made over his neck after injection of the 0.25% Sensorcaine.  Dissection  was carried down to what appeared be a lipoma was identified.  It was  fatty lobulated and measured 3 x 4 cm.  This was excised in its  entirety.  This was down to the fascia of the muscle.  This was then  passed off the field.  Wound was closed in layers using a deep layer of  3-0 Vicryl and a 4-0 Monocryl subcuticular stitch.  Dermabond was  applied as dressing.  Hemostasis was excellent.  All final counts of  sponge, needle, and instruments were found be correct at this portion of  the case.  The patient was awoke and taken to the recovery room in  satisfactory condition.      Thomas A. Cornett,  M.D.  Electronically Signed    TAC/MEDQ  D:  09/12/2007  T:  09/12/2007  Job:  [id number]   cc:   [name], MD

## 2010-07-30 NOTE — Assessment & Plan Note (Signed)
[facility]                              CARDIOLOGY OFFICE NOTE   Fred [name], Fred [name]                     MRN:          [id number]  DATE:01/16/2006                            DOB:          1935/07/23    Fred [name] is a very pleasant gentleman who is status post coronary artery  bypass graft.  Since I last saw him, he is doing extremely well.  There is  no dyspnea, chest pain, palpitations, syncope, or claudication.   MEDICATIONS:  1. Aspirin 325 mg p.o. daily.  2. Niaspan 1 gram p.o. daily.  3. Hydrochlorothiazide 25 mg p.o. daily.  4. Toprol 50 mg p.o. daily.  5. Zocor 40 mg p.o. q.h.s.  6. Zetia 10 mg p.o. daily.  7. Zestril 10 mg p.o. daily.  8. Januvia 100 mg p.o. daily.  9. Actos 30 mg p.o. daily.  10.Glucophage 1000 mg tablets 1/2 p.o. daily.  11.Glimepiride 2 mg p.o. daily.   PHYSICAL EXAMINATION:  VITAL SIGNS:  Blood pressure 130/70, pulse 71.  NECK:  Supple with no bruits.  CHEST:  Clear.  HEART:  Regular rate and rhythm.  EXTREMITIES:  Trace edema.   Electrocardiogram today shows a sinus rhythm at a rate of 69.  There is left  axis deviation, but there are no ST changes noted.   ADMISSION DIAGNOSES:  1. Coronary artery disease, status post coronary artery bypass graft.  2. Hyperlipidemia.  3. Diabetes mellitus.  4. Hypertension.   PLAN:  Fred [name] is doing extremely well from a symptomatic standpoint.  His blood pressure is well controlled and he is exercising routinely and  following a diet.  His most recent lipids showed a total cholesterol of 123  with an HDL of 40 and an LDL of 66.  His liver functions were normal.  We  will therefore make no changes in his medications and he will continue with  risk factor modification.  Note; he does not smoke.  He will see [place] back in  12 months.   ______________________________  Madolyn Frieze. Jens Som, MD, [facility]   BSC/MedQ  DD: 01/16/2006  DT: 01/16/2006  Job #: [id number]

## 2010-07-30 NOTE — Consult Note (Signed)
Fred [name], BOOMHOWER              ACCOUNT NO.:  [id number]   MEDICAL RECORD NO.:  [id number]          PATIENT TYPE:  INP   LOCATION:  2038                         FACILITY:  [facility]   PHYSICIAN:  Pramod P. Pearlean Brownie, MD    DATE OF BIRTH:  [birth year]   DATE OF CONSULTATION:  DATE OF DISCHARGE:                                   CONSULTATION   REFERRING PHYSICIAN:  Scott M. Kriste Basque, M.D. [facility]   REASON FOR REFERRAL:  Seizure.   HISTORY OF PRESENT ILLNESS:  Mr. Bacci is a 75 year old Caucasian male who  is unable to provide history of two episodes, but his wife and granddaughter  who were eyewitnesses described the episodes.  The first episode occurred in  mid-December 2005, about a couple of weeks after he had left the hospital  following open heart surgery.  The patient apparently woke up from sleep  mumbling, and the wife described his eyeballs as rolling up, him having a  blank look on his face, with generalized tonic-clonic movements of all four  extremities.  This lasted a few minutes.  After that, he was quiet for a  minute, and then gradually regained consciousness and stated that he was  having a bad dream.  Did not have a headache or incontinence or any injury.  The second episode occurred a month later when in the evening time he was  transiently unresponsive.  He turned his neck to the right, had a blank  stare, and eyeballs rolled up, and this lasted about for a minute, and he  was a little disoriented for a few minutes after that and then back to his  baseline.  There was no apparent precipitant like low blood sugar, lack of  sleep, or alcohol.  He has no prior history of stroke, syncope, seizures,  TIA, strokes, or headaches.   PAST MEDICAL HISTORY:  1.  Diabetes.  2.  Hypertension.  3.  Hyperlipidemia.  4.  Ischemic heart disease.   PAST SURGICAL HISTORY:  Coronary artery bypass graft surgery, December 2005.   MEDICATION ALLERGIES:  None.   MEDICATION LIST:   Hydrochlorothiazide; Altace; Avandia; Niaspan; Glucophage;  Toprol XL; Zocor; __________; Niferex; aspirin; Advair; ranitidine;  prednisone; Aciphex; Mucinex.   SOCIAL HISTORY:  The patient is retired.  He used to work as a Web designer.  He lives in [place] with his wife.  He does not smoke or  drink.   REVIEW OF SYSTEMS:  Significant for some shortness of breath with pleural  effusion which has been diagnosed on the left side.  No chest pain, fever,  cough, diarrhea, or illness.   PHYSICAL EXAMINATION:  GENERAL:  Reveals an obese Caucasian middle-aged male  who is not in distress.  TEMPERATURE:  Afebrile.  PULSE RATE:  72 per minute, regular.  RESPIRATORY RATE:  16 per minute.  DISTAL PULSES:  Well felt.  HEAD:  Atraumatic.  NECK:  Supple, without bruit.  ENT:  Unremarkable.  CARDIAC:  No murmur or gallop.  LUNGS:  Clear to auscultation, with decreased breath sounds on the left  posteriorly.  ABDOMEN:  Soft, nontender.  NEUROLOGIC:  The patient is awake, alert, oriented x 3, with normal speech  and language function.  There is no aphasia, apraxia, or dysarthria.  Pupils  are equally reactive to light and accommodation.  Visual acuity and fields  adequate.  Face is symmetric.  Palatal movements are normal.  Tongue is  midline.  Motor system exam reveals no upper extremity drift.  Symmetric  strength, tone.  Reflexes including ankle jerks.  Plantars are downgoing.  There is no sensory loss.  Coordination is intact.  Gait is steady.   DATA REVIEWED:  MRI scan of the brain done today reveals no acute infarct or  any other __________ brain lesion.  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  MRA of the intracranial circulation reveals no  high-grade stenosis.  Contrast images do not reveal abnormal areas of  enhancement.  There is moderate periventricular subcortical white matter,  microangiopathic changes seen.   IMPRESSION:  A 75 year old  gentleman with two episodes of brief altered  consciousness, possibly complex partial seizures with and without secondary  generalization.  No obvious precipitant via history, but he does have a  strong family history of epilepsy in his father and niece which would  certainly put him at increased risk for recurrent seizures.   PLAN:  I would recommend further evaluation with an EEG as well as checking  urine drug screen and a B-12 and TSH.  Recommend treatment trial of Keppra  250 mg twice a day for a week, increase as tolerated to 500 twice a day if  without side effects.  I have discussed possible side effects with the  patient.  He may electively follow up with me as an outpatient for further  treatment and followup.  I had a long discussion with the patient and  multiple family members and answered questions.   Thank you for the referral.      PPS/MEDQ  D:  05/12/2004  T:  05/12/2004  Job:  [id number]

## 2010-07-30 NOTE — Discharge Summary (Signed)
Fred [name], Fred NO.:  [id number]   MEDICAL RECORD NO.:  [id number]          PATIENT TYPE:  INP   LOCATION:  2023                         FACILITY:  [facility]   PHYSICIAN:  Salvatore Decent. Cornelius Moras, M.D. DATE OF BIRTH:  1936/01/19   DATE OF ADMISSION:  02/13/2004  DATE OF DISCHARGE:                                 DISCHARGE SUMMARY   ADMISSION DIAGNOSIS:  Exertional shortness of breath.   PAST MEDICAL HISTORY AND DISCHARGE DIAGNOSES:  1.  Hypertension.  2.  Type 2 diabetes mellitus.  3.  Hypercholesterolemia.  4.  Three-vessel coronary artery disease status post coronary artery bypass      grafting x4.  5.  Postoperative anemia, stable.  6.  Postoperative atrial fibrillation resolved with amiodarone.  7.  Postoperative mild elevation of BUN and creatinine which is currently      trending down.   ALLERGIES:  No known drug allergies.   BRIEF HISTORY:  The patient is a 75 year old white male who has been  followed by Dr. Alroy Dust, Dr. Olga Millers, and Dr. Rollene Rotunda for  coronary artery disease.  The patient had no previous history of coronary  artery disease, but his risk factors including a history of hypertension,  hyperlipidemia, type 2 diabetes mellitus, and a remote history of tobacco  use.  The patient began to report a mild progression of exertional shortness  of breath over the last several years.  He was seen in followup by Dr. Kriste Basque  and a stress Cardiolite was performed on January 07, 2004 as part of routine  care given the patient's underlying risk factors.  Findings were notable for  the presence of a resting ejection fraction of 63% with positive EKG changes  and nuclear imaging changes suggestive of inferior wall ischemia.  This  occurred despite the absence of significant chest pain during the test.  The  patient was then evaluated by Dr. Jens Som and subsequently underwent an  elective cardiac catheterization by Dr. Antoine Poche.  Findings of  the cardiac  catheterization were notable for severe three-vessel coronary artery disease  with preserved left ventricular function.  The patient was subsequently  referred to Dr. Cornelius Moras of [facility] regarding surgical revascularization.  After  evaluating the patient and reviewing the patient's information, it was Dr.  Orvan July opinion that the patient should undergo elective coronary artery  bypass graft surgery.   HOSPITAL COURSE:  The patient was admitted and taken to the OR on February 13, 2004 for coronary artery bypass grafting x4.  The left internal mammary  artery was grafted to the LAD, left radial artery was grafted to the PDA,  saphenous vein was grafted to the CM-1, and saphenous vein was grafted to  the CM-2.  Open vein harvest was performed on the right thigh and open left  radial artery harvest was also performed.  The patient tolerated the  procedure well and was hemodynamically stable immediately postoperatively.  No blood products were given throughout the operation.  The patient was  transferred to the OR to the SICU in stable condition.  The patient was  extubated without complication and woke up from anesthesia neurologically  intact.   The patient's postoperative course has progressed as expected.  On  postoperative day #1, the patient was doing well.  He was noted to have  postoperative anemia with a hemoglobin of 8.2.  The patient has also been  volume overloaded postoperatively and has been diuresed accordingly.  The  patient's invasive lines and tubes were discontinued in a routine manner.  The patient was started on Imdur postoperatively to maintain his radial  artery conduit.  The patient's diabetes mellitus has been controlled on  Lantus insulin and his p.o. medications were restarted accordingly.   The patient was transferred from the SICU to unit 2000 without difficulty.  On postoperative day #3 the patient was noted to have an episode of atrial  flutter and was  therefore started on IV amiodarone.  The patient converted  back to a normal sinus rhythm and has maintained a normal sinus rhythm since  that time.  He was converted from IV to p.o. amiodarone without difficulty.  He began cardiac rehab on postoperative day #3 as well.  The patient has  tolerated this well and has continued to progress.   On postoperative 4, the patient's BUN and creatinine were noted to be  slightly elevated at 43 and 1.6.  The patient's Altace was held and the  Lasix was discontinued.  A repeat BMP on postoperative day #5 revealed that  the patient's BUN and creatinine are currently trending down at 44 and 1.2.  On postoperative day #5, the patient without complaint and feeling well.  He  is afebrile and the vital signs are stable.  Physical exam revealed cardiac  regular rate and rhythm.  Pulmonary exam reveals slightly diminished breath  sounds in the bases, otherwise clear.  The abdomen is soft and nontender.  There are active bowel sounds.  The extremities have 1+ pitting edema in the  bilateral lower extremities and the incisions are healing well.  The patient  is maintaining a normal sinus rhythm on amiodarone and Lopressor.  The  patient's Altace has continued to be held secondary to elevated BUN and  intermittent episodes of hypotension.  Also, on postoperative day #5 the  patient was noted to have several hypoglycemic episodes and therefore his  Lantus insulin was discontinued.  He will be continued on his p.o. meds  after discharge.  The patient is in stable condition at this time and as  long as he continues to progress in the current manner should be ready for  discharge in the next 1-2 days.   LABORATORY DATA:  CBC and BMP on February 18, 2004:  White count 8.0,  hemoglobin 8.2, hematocrit 23.3, platelet count 247, sodium 139, potassium  3.9, BUN 44, creatinine 1.2, glucose 38.  CONDITION ON DISCHARGE:  Improved.   INSTRUCTIONS:   MEDICATIONS:  1.   Aspirin 325 mg daily.  2.  Toprol XL 50 mg daily.  3.  Crestor 10 mg q.h.s.  4.  Imdur 30 mg daily.  5.  Avandia 8 mg daily.  6.  Amaryl 8 mg daily.  7.  Amiodarone 400 mg b.i.d. x2 weeks, then as directed.  8.  Colace 20 mg p.o. daily p.r.n. constipation.  9.  Tylox 1-2 p.o. q.4-6h p.r.n. pain.   ACTIVITY:  No driving and no lifting of more than 10 pounds.  The patient is  to continue daily breathing and walking exercises.   DIET:  Low salt, low  fat, carbohydrate modified, medium calory diet.   WOUND CARE:  The patient may shower daily and clean the incisions with soap  and water.  If the incisions become red, swollen, or drain, or the patient  has fever of 101 degrees Fahrenheit, he is to contact [facility] office at [phone]  [phone].   FOLLOW UP:  Dr. Kriste Basque which will be arranged by [facility].  This  appointment should be two weeks after discharge.  A chest x-ray will be  taken at that time which the patient will be instructed to bring with him to  the followup appointment with Dr. Cornelius Moras on March 22, 2004 at 1:45 p.m.      [place]   AY/MEDQ  D:  02/18/2004  T:  02/18/2004  Job:  [id number]

## 2010-07-30 NOTE — Discharge Summary (Signed)
Fred [name], Fred [name] NO.:  [id number]   MEDICAL RECORD NO.:  [id number]          PATIENT TYPE:  INP   LOCATION:  2038                         FACILITY:  [facility]   PHYSICIAN:  Fred [name]. Fred [name], M.D. [facility] OF BIRTH:  [birth year]   DATE OF ADMISSION:  05/11/2004  DATE OF DISCHARGE:  05/13/2004                                 DISCHARGE SUMMARY   FINAL DIAGNOSES:  1.  Admitted on May 11, 2004, with several acute issues, including two      recent spells compatible with seizures; increasing left pleural      effusion status post coronary artery bypass grafting; and elevated blood      sugar from diabetes and recent Medrol therapy.  2.  Spells compatible with partial complex seizures, status post evaluation      by Dr. 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      The patient was started on treatment with Keppra 250 mg p.o. b.i.d. and      will be followed by Dr. Pearlean [name].  3.  Left pleural effusion which has occurred status post coronary artery      bypass grafting; status post thoracentesis with removal of 1300 mL of      exudative effusion, continued followup planned.  4.  Diabetes mellitus with recent exacerbation secondary to Medrol therapy      for asthmatic bronchitis.  5.  History of atherosclerotic heart disease with previous coronary artery      bypass grafting x 4 in December of 2005 by Dr. Barry [name].  6.  Postoperative atrial fibrillation.  Patient now off Coumadin and      amiodarone and followed by Dr. Jens [name].  7.  History of hypertension, controlled on medications.  8.  History of hyperlipidemia, controlled on Zocor and Niaspan.  9.  History of postoperative anemia.   BRIEF HISTORY:  The patient is a 75 year old  gentleman known to me with  multiple medical problems as noted above.  He presented to the office on  May 11, 2004, with multiple problems as listed.  The family was worried  about the possibilities of seizures.  He had had two recent episodes  involving violent shaking, eyes rolling back in his head, being pale and  mumbling incoherently.  These lasted for several seconds with a question of  a mild postictal period.  He also had some associated mood swings and  irritability, according to the family.  There is a family history of  epilepsy in the patient's father.  He had recently had coronary artery  bypass grafting and postoperatively he had a small left effusion.  He had  persistent upper respiratory tract symptoms with cough and beige sputum  production.  This was associated with some dyspnea and chest discomfort.  A  followup chest x-ray showed increasing left effusion which needed  attention.  Finally, his blood sugar had been found to be quite elevated at 314 that day  in association with this upper respiratory infection and Medrol therapy for  asthmatic bronchitis.  Due to the multiplicity of problems, decision was  made for admission for prompt attention to these matters.   PAST MEDICAL HISTORY:  As noted, he has a history of atherosclerotic heart  disease and coronary disease with recent four-vessel coronary artery bypass  grafting by Dr. Cornelius [name].  Postoperatively, he developed some atrial  fibrillation and was placed on amiodarone and Coumadin.  The amiodarone was  discontinued over the last month because of nausea.  The nausea resolved off  amiodarone.  He was holding normal sinus rhythm.  He has been followed by  Dr. Jens [name].  He has a history of hypertension controlled on medications  and hyperlipidemia controlled on Zocor and Niaspan.  He diabetic, controlled  on oral agents.  He had recently been treated for an upper respiratory  infection and asthmatic bronchitis with  antibiotics and Medrol.  This caused  exacerbation in his blood sugar and it was 314 on the day of admission.  He  also has a history of postoperative anemia.   PHYSICAL EXAMINATION:  GENERAL APPEARANCE:  The physical examination at the  time of admission revealed a 75 year old gentleman in no acute distress.  VITAL SIGNS:  Blood pressure 134/76, pulse 86 per minute and regular,  respirations 20 per minute and not labor, O2 98% on room air.  HEENT:  Unremarkable.  There were no lesions identified.  NECK:  No jugular venous distention.  No carotid bruits.  No thyromegaly or  lymphadenopathy.  CHEST:  Dullness at the left base with decreased breath sounds at the left  base compatible with an effusion.  He had some scattered rhonchi as well.  CARDIOVASCULAR:  Regular rhythm.  Grade 1/6 systolic ejection murmur at the  left sternal border without rubs or gallops detected.  Evidence of previous  and recent coronary artery bypass grafting.  GASTROINTESTINAL:  The abdomen was soft and nontender without evidence of  organomegaly or masses.  EXTREMITIES:  No cyanosis, clubbing or edema.  Vein graft sites were okay.  NEUROLOGIC:  Intact without focal abnormalities detected at the time of  admission.  DERMATOLOGIC:  Negative.   LABORATORY DATA:  EKG showed normal sinus rhythm and nonspecific ST-T wave  changes.  Chest x-ray revealed left pleural effusion and evidence of recent  coronary artery bypass grafting.  Post thoracentesis there was a small  residual effusion and some basilar atelectasis.  MRI of the brain showed  significant white matter changes, likely the sequelae of small vessel  disease in this diabetic patient.  There was an old infarction in the left  thalamus.  No acute infarctions or intracranial enhancing lesions.  The MRA  revealed evidence of prominent intracranial atherosclerotic-type changes as  noted.  An EEG was performed.  This report is pending this dictation. Hemoglobin  12.8, hematocrit 38.0, white count 11,900 with 74% segs.  Sedimentation rate 54.  Pro time 13.6, INR 1.1, PTT 28 seconds.  Sodium 137,  potassium 4.1, chloride 100, CO2 28, BUN 35, creatinine 1.3, blood sugar  315.  Serial blood sugars were followed with the patient on sliding scale.  Calcium 9.2, total protein 7.2, albumin 3.3, AST 17, ALT 14, alkaline  phosphatase 50, total bilirubin 0.4.  Hemoglobin A1C 7.0.  Beta natruretic  peptide 96.  TSH 0.73.  B12 level normal at  346.  Hemoglobin A1C 7.0.  Thoracentesis fluid revealed 1300 mL of clear yellow fluid, cytology is  pending, total protein 5.1, HDL 71, glucose 243.  Cell count 350 with 13  segs, 41 lymphs and 46% mononuclear cells/macrophages.   HOSPITAL COURSE:  The patient was admitted with multiple problems as noted.  He was continued on his home medicines, including diuretic and potassium  supplementation.  MRI/MRA was ordered along with EEG and left thoracentesis  by radiology.  He was covered on sliding scale Humalog insulin for his  elevated blood sugars.  These responded nicely to therapy and returned to  the mid 100 range.  Thoracentesis, as noted, yielded 1300 mL of exudative  fluid, which was removed.  His postoperative x-ray showed minimal residual  effusion and some basilar atelectasis.  He was given incentive spirometry  therapy to aid lung expansion.  The patient was seen by Dr. Barry [name] for  thoracic surgery.  Dr. Barry [name] recommended discharge on the diuretic with  followup in several weeks with a followup x-ray.   The patient was seen by Dr. Pearlean [name] of the neurology service.  Dr. Pearlean [name] felt  his history was compatible with partial complex seizures and recommended  treatment with Keppra.  The MRI showed small vessel disease and intracranial  atherosclerotic changes and he is already on aspirin daily.  An EEG was  obtained and full results are pending.  The patient was felt to be [facility] and  ready for discharge on May 13, 2004,  with outpatient followup.   MEDICATIONS ON DISCHARGE:  1.  Keppra 250 mg p.o. b.i.d.  2.  Advair 100/50 mg one puff b.i.d.  3.  Mucinex DM one or two tablets p.o. b.i.d. with plenty of fluids.  4.  Toprol XL 50 mg one p.o. daily.  5.  Altace 2.5 mg p.o. daily.  6.  HCTZ 25 mg p.o. daily.  7.  K-Dur 20 mEq one tablet p.o. daily.  8.  Enteric-coated aspirin 81 mg p.o. daily.  9.  Avandia 8 mg p.o. daily.  10. Glucophage 500 mg p.o. b.i.d.  11. Amaryl 4 mg p.o. daily.  12. Zocor 40 mg p.o. q.h.s.  13. Niaspan 500 mg p.o. q.h.s.  14. Tylenol two tablets every four to six hours as needed for pain.   DIET:  The patient was instructed on a no concentrated sweets 2 g sodium  diet.   He will stop his previous Medrol therapy, which was completed in the  hospital stay.   FOLLOWUP:  He was given a followup appointment with Dr. Kriste [name] on Thursday,  May 27, 2004, at 12:30 p.m.  Dr. Cornelius [name] would like to see him on Monday, May 31, 2004, and we will set up an appointment for Dr. Pearlean [name] to follow up  on his partial complex seizures.   CONDITION ON DISCHARGE:  Improved.      SMN/MEDQ  D:  05/13/2004  T:  05/13/2004  Job:  [id number]   cc:   Salvatore Decent. Fred [name], M.D.  [address]  [place]  [place] [address]   Ellamae Sia Kirtland Bouchard. Fred [name], M.D.  [address]  [place]  [place] [address]  Fax: [phone]

## 2010-07-30 NOTE — Procedures (Signed)
REFERRED BY:  [name], M.D.   CLINICAL COURSE:  A 75 year old male with two episodes of partial onset  seizures with second _________.   MEDICATIONS:  Hydrochlorothiazide, Altace, Avandia, Niaspan, Glucophage,  Toprol, Zocor, Niferex, aspirin.   This is a 17 channel EEG performed during wakeful and drowsy states using  standard 10/20 electrode placement.   Background awake rhythm consists of 8-9 Hz alpha which was of medium  amplitude, synchronous and reactive to eye opening and closure. Intermittent  6-7 Hz theta slowing is seen bilaterally in the temporal and frontal head  regions.  Intermittent left central and frontal sharp activities seen.  No  paroxysmal epileptiform activity or spikes are seen.  Sleep state is not  achieved in this tracing except for mild drowsiness which was prominent  central sharp activity left more right.  The length of the tracing is 20.4  minutes, technical component is average.  EKG tracing revealed sinus rhythm,  hyperventilation is not performed, photic stimulation with symmetric driving  response.   IMPRESSION:  The EEG performed in wakeful and drowsy states is abnormal due  to the presence of left hemispheric central and frontal cortical  irritability. No definite epileptiform activity identified. Seizures are  strongly suspected and a repeat sleep study may be beneficial.      ZOX:WRUE  D:  05/12/2004 18:47:20  T:  05/13/2004 07:50:28  Job #:  [id number]   cc:   [name], M.D. [facility]

## 2010-07-30 NOTE — Cardiovascular Report (Signed)
NAME:  Fred [name], NOON NO.:  [id number]   MEDICAL RECORD NO.:  [id number]          PATIENT TYPE:  OIB   LOCATION:  6501                         FACILITY:  [facility]   PHYSICIAN:  Rollene Rotunda, M.D.   DATE OF BIRTH:  [birth year]   DATE OF PROCEDURE:  01/26/2004  DATE OF DISCHARGE:                              CARDIAC CATHETERIZATION   PRIMARY PHYSICIAN:  [name], M.D.   CARDIOLOGIST:  Olga Millers, M.D. [facility]   PROCEDURE:  Left heart catheterization/coronary arteriography.   INDICATION:  The patient with dyspnea, longstanding diabetes, and a  Cardiolite suggesting inferior ischemia.   PROCEDURE NOTE:  Left heart catheterization was performed via the right  femoral artery.  The artery was cannulated using anterior wall puncture.  A  #4 French arterial sheath was inserted via the modified Seldinger technique.  Preformed Judkins and pigtail catheter were utilized.  The patient tolerated  the procedure well and left the lab in stable condition.   RESULTS:  1.  Hemodynamics:  LV 127/13, AO 148/72.  2.  Coronaries:  Left main was normal.  The LAD was heavily calcified.      There was a long mid 60-70% stenosis.  There was a first diagonal which      was small with a proximal 60% stenosis.  A second diagonal was small      with ostial 75% stenosis.  The circumflex in the AV groove had diffuse      luminal irregularities.  There was a large OM1 which was occluded      proximally and seen to fill via distal collaterals predominantly from      the septal perforators.  OM2 was moderate sized and subtotally stenosed      with some bridging collaterals and left-to-left collaterals.  The distal      circumflex had an 80% stenosis before a moderate-sized posterolateral 1      and posterolateral 2.  The right coronary artery was a dominant vessel.      It was occluded proximally.  There were septal and distal LAD      collaterals demonstrating scant filling of the PDA and  posterolaterals.  3.  Left ventriculogram:  A left ventriculogram was obtained in the RAO      projection.  The EF was 65% with normal wall motion.   CONCLUSION:  Severe three-vessel coronary artery disease.  Preserved left  ventricular function.   PLAN:  I have reviewed the films with Dr. Jens Som.  Given the three-vessel  nature of his disease, the patient will be referred for CVTS consult for  coronary artery bypass graft.     JH/MEDQ  D:  01/26/2004  T:  01/26/2004  Job:  [id number]   cc:   [name], M.D. [facility]

## 2010-07-30 NOTE — Op Note (Signed)
NAMELASHON, Fred [name]              ACCOUNT NO.:  [id number]   MEDICAL RECORD NO.:  [id number]          PATIENT TYPE:  INP   LOCATION:  2312                         FACILITY:  [facility]   PHYSICIAN:  Salvatore Decent. Cornelius Moras, M.D. DATE OF BIRTH:  November 25, 1935   DATE OF PROCEDURE:  02/13/2004  DATE OF DISCHARGE:                                 OPERATIVE REPORT   PREOPERATIVE DIAGNOSIS:  Severe three vessel coronary artery disease with  preserved left ventricular function and Class II exertional angina.   POSTOPERATIVE DIAGNOSIS:  Severe three vessel coronary artery disease with  preserved left ventricular function and Class II exertional angina.   PROCEDURE:  Median sternotomy for coronary artery bypass grafting x4 (left  internal mammary artery to distal left anterior descending coronary artery,  left radial artery to posterior descending coronary artery, saphenous vein  graft to first circumflex marginal branch, saphenous vein graft to second  circumflex marginal branch, open saphenous vein harvest from right thigh).   SURGEON:  Salvatore Decent. Cornelius Moras, M.D.   ASSISTANT:  Toniann Fail, P.A.   ANESTHESIA:  General.   BRIEF CLINICAL NOTE:  Patient is a 75 year old white male from [place],  [place] with a history of hypertension, hyperlipidemia, type 2  diabetes mellitus, and a remote history of tobacco use.  The patient  presents with exertional shortness of breath.  He underwent a stress  Cardiolite exam which was abnormal, prompting cardiac catheterization.  This  was performed on January 07, 2004, and notable for the presence of severe  three vessel coronary artery disease with preserved left ventricular  function.  A full consultation note has been dictated previously.  The  patient and his family have been counseled at length regarding the  indications and potential benefits of coronary artery bypass grafting.  He  understands and accepts all associated risks of surgery and desires  to  proceed as described.   OPERATIVE FINDINGS:  1.  Diffuse distal vessel coronary artery disease in all three vascular      territories.  2.  Normal left ventricular function.  3.  Distal left circumflex coronary artery and its posterolateral branches      too small and diffusely diseased for grafting.  4.  Relatively small and poor quality greater saphenous vein in both thighs      which was very superficial.  5.  Relatively small radial artery.  6.  This patient probably would not be considered candidate for redo      coronary artery bypass grafting in the future.   OPERATIVE NOTE IN DETAIL:  The patient was brought to the operating room on  the above-mentioned date and central monitoring was established by the  anesthesia service under the care and direction of Dr. Katrinka Blazing.  Specifically,  a Swan-Ganz catheter is placed through the right internal jugular approach.  A radial arterial line is placed.  Intravenous antibiotics are administered.  Following induction with general endotracheal anesthesia, a Foley catheter  is placed.  The patient's chest, abdomen, both groins, and both lower  extremities are prepared and draped in a sterile manner.  The left radial artery is exposed through a small incision made along the  volar aspect of the distal left forearm.  The radial artery is notably  slightly small caliber, although normal in appearance and without any  atherosclerotic disease or plaque.  The radial artery is notably quite prone  to spasm.  Due to its relatively small size and propensity for spasm,  further proceeding with harvest for use of the conduit for grafting is put  on hold while the greater saphenous vein is exposed and harvested.   An attempt at endoscopic vein harvest is performed to remove the greater  saphenous vein from the right thigh.  However, the greater saphenous vein is  notably small and extremely superficial.  An incision is made initially just  above  the right knee and a second incision made just below the right knee.  Subsequently, another incision is made just above the left knee and the  greater saphenous vein is identified in the left thigh.  This vein is even  smaller and also extremely superficial.  The greater saphenous vein on the  right side is felt to be better quality due to its slightly larger size.  Because of the fact it is so superficial, endoscopic vein harvest is  aborted.  The greater saphenous vein is then removed from the right thigh  using open technique through a series of longitudinal incisions.  Because  the vein itself is relatively small caliber and only a fair quality conduit,  left radial artery is now felt to be at least as good a choice as conduit.  After the greater saphenous vein is removed from the entire right thigh, all  of the surgical incisions in both the right and left lower extremity are  closed in multiple layers with running absorbable suture.   The left radial artery is harvested from the left forearm to a longitudinal  excision.  It is mobilized from just above the wrist to just below the  antecubital fossa after the takeoff of the large first interosseous  perforating branch.  All of the intervening side branches of the artery and  its associated veins are divided using the harmonic scalpel.  Care is taken  to avoid injury to the superficial branch of the left radial nerve during  the dissection.  The radial artery subsequently transected just above the  wrist and the distal stump is oversewn with suture ligatures.  The radial  artery is notably excellent flow.  It is then transected just below the  antecubital fossa and the proximal stump oversewn with suture ligatures.  The radial artery is placed in a small container with the patient's  heparinized blood, containing papaverine, for storage temporarily.  The forearm incision is irrigated with saline solution, inspected for  hemostasis,   and subsequently closed using a standard closure of running  absorbable suture.   A median sternotomy incision is performed.  The left internal mammary artery  is dissected from the chest wall and prepared for bypass grafting.  The left  internal mammary artery is good quality conduit with excellent flow.  It is  slightly small caliber.  The patient is heparinized systemically.   The pericardium is opened.  The ascending aorta is normal in appearance.  The ascending aorta and the right atrium are cannulated for cardiopulmonary  bypass.  Adequate heparinization is verified.  Distal sites are selected for  coronary bypass grafting.  There is diffuse coronary artery disease which is  visible  and palpable throughout all of the epicardial coronary arteries.  The right coronary artery gives rise to a posterior descending coronary  artery which has a relatively early takeoff and traverses across the acute  margin of the heart to reach the intraventricular septum distally.  The  distal right coronary artery beyond this vessel is diffusely diseased and  neither it nor any of its distal branches are graftable.  The distal left  circumflex coronary artery and its small posterolateral branches are too  small and diffusely diseased for grafting.  Portions of saphenous vein and  the radial artery are all trimmed to appropriate length.  A temperature  probe is placed in the left ventricular septum.  A cardioplegia catheter is  placed in the ascending aorta.   The patient is cooled to 32 degrees systemic temperature.  The aortic cross  clamp is applied and cardioplegia is delivered in an antegrade fashion  through the aortic root.  Iced saline slush is applied for topical  hypothermia.  The initial cardioplegic arrest and myocardial cooling are  felt to be excellent.  Repeat doses of cardioplegia are administered  intermittently throughout the cross clamp portion of the operation both  through the  aortic root and down the subsequently placed vein grafts to  maintain septal temperature below 15 degrees Centigrade.  The following  distal coronary anastomoses are performed:  1.  The posterior descending coronary artery is grafted with the left radial      artery in an end-to-side fashion.  This vessel measures 1.1 mm in      diameter and is of fair to poor quality.  2.  The first circumflex marginal branch is grafted with the saphenous vein      graft in an end-to-side fashion.  This vessel is chronically occluded      proximally and diffusely diseased.  It is intramyocardial at the site of      distal bypass but measures 2 mm in diameter.  3.  The second circumflex marginal branch is grafted with saphenous vein      graft in an end-to-side fashion  This coronary artery is 1.4 mm in      diameter at the site of the distal bypass and is of fair quality. 4.  The distal left anterior descending coronary artery is grafted with left      internal mammary artery and an end-to-side fashion.  This vessel is      diffusely diseased and the distal anastomosis is quite far distally on      the vessel.  However, a 1.5 mm probe will pass in both directions.  The      left ventricular septal temperature is noted to rise rapidly following      reperfusion of the left internal mammary artery.  The aortic cross clamp      was removed after a total cross clamp time of 67 minutes.   All three proximal anastomoses are performed under a separate partial  occlusion clamp.  The proximal anastomosis of the left radial artery is  constructed using a short interposition segment of saphenous vein due to the  very small caliber of the radial artery conduit itself.  All proximal and  distal coronary anastomoses are then inspected for hemostasis and  appropriate graft orientation.  Epicardial pacing wires are affixed from the  right ventricular outflow tract into the right atrial appendage.  The  patient was  rewarmed to 37 degrees Centigrade temperature.  The  patient was  weaned from cardiopulmonary bypass without difficulty.  The patient's rhythm  at separation from bypass is normal sinus rhythm.  No inotropic support is  required.   Venous and arterial cannulae are removed uneventfully.  Protamine is  administered to reverse the anticoagulation.  The mediastinum and the left  chest are irrigated with saline solution containing vancomycin.  Meticulous  surgical hemostasis is ascertained.  The mediastinum and the left chest are  drained with three chest tubes placed through separate stab incisions  inferiorly.  The median sternotomy was closed in routine fashion.  The soft  tissues and the sternum are closed with multiple layers and the skin is  closed with running subcuticular skin closure.   The patient tolerated the procedure well and is transported to the surgical  intensive care unit in stable condition.  There are no intraoperative  complications.  All sponge, instrument and needle counts are verified  correct at completion of the operation.  No blood products were  administered.      Clar   CHO/MEDQ  D:  02/13/2004  T:  02/15/2004  Job:  [id number]   cc:   Olga Millers, M.D. [facility]   Rollene Rotunda, M.D.   [name], M.D. [facility]

## 2010-07-30 NOTE — H&P (Signed)
NAME:  Fred [name], ADACHI              ACCOUNT NO.:  [id number]   MEDICAL RECORD NO.:  [id number]          PATIENT TYPE:  INP   LOCATION:                               FACILITY:  [facility]   PHYSICIAN:  Salvatore Decent. Cornelius Moras, M.D. DATE OF BIRTH:  September 11, 1935   DATE OF ADMISSION:  02/13/2004  DATE OF DISCHARGE:                                HISTORY & PHYSICAL   PRESENTING CHIEF COMPLAINT:  Exertional shortness of breath.   HISTORY OF PRESENT ILLNESS:  [name] is a 75 year old white male from  [place], [place], who is followed by Dr. Alroy Dust and referred  by Dr. Olga Millers and Dr. Rollene Rotunda for management of coronary  artery disease. [name] has no previous history of coronary artery  disease, but risk factors notable for a history of hypertension,  hyperlipidemia, type 2 diabetes mellitus, and a remote history of tobacco  use.  The patient has been in his usual state of health, although he does  report mild progression of exertional shortness of breath over the last  several years. He was seen in follow-up by Dr. Kriste Basque and a stress Cardiolite  exam was performed as part of routine surveillance given the patient's  underlying risk factors. This examination was performed on January 07, 2004,  and findings were noted for the presence of resting ejection fraction of 63%  with positive EKG changes and nuclear imaging changes suggestive of inferior  wall ischemia. These occurred despite the absence of significant chest pain  during stress. The patient was evaluated by Dr. Jens Som and subsequently  underwent elective catheterization by Dr. Antoine Poche. Findings at the time of  catheterization are notable for severe three-vessel coronary artery disease  with preserved left ventricular function. Mr. Lamountain has been referred for  elective surgical revascularization.   REVIEW OF SYSTEMS:  GENERAL: The patient reports feeling well overall. He  has a good appetite and has not  been gaining or losing weight. CARDIAC:  Notable for the absence of symptoms of chest pain, chest tightness, or chest  pressure either with activity or at rest. The patient reports mild  exertional shortness of breath. He states that with normal activities he  does not have problems, but if he pushes himself he will get shortness of  breath with activity. He denies resting shortness of breath, PND, orthopnea,  or lower extremity edema. He has not had palpitations nor syncope.  RESPIRATORY: Notable for recent history of tracheobronchitis two or three  weeks ago. This was transient, short lived, and has resolved. The patient  denies hemoptysis, productive cough, or wheezing. GASTROINTESTINAL:  Negative. The patient has no difficulty swallowing. He denies history of  hematochezia, hematemesis, or melena. He has not had problems with  constipation or diarrhea. GENITOURINARY: Negative. The patient denies  urinary urgency, frequency, or nocturia. PERIPHERAL VASCULAR: Negative.  The  patient denies symptoms suggestive of claudication. The patient denies  problems with lower extremity phlebitis or deep venous thrombosis.  NEUROLOGIC: Negative. The patient denies symptoms suggestive of previous TIA  or stroke. MUSCULOSKELETAL: Notable for mild arthritis or joint pain  particularly afflicting his right shoulder. PSYCHIATRIC: Negative. HEENT:  Negative. The patient is edentulous. HEMATOLOGIC: Negative. The patient  denies bleeding diathesis or easy bruising. ENDOCRINE: Notable for a history  of type 2 diabetes mellitus. The patient checks his blood sugars  approximately every other day. He states that recently they have been  averaging around 250 when he checks them. He does not know what his most  recent hemoglobin A1C value was.   PAST MEDICAL HISTORY:  1.  Hypertension.  2.  Type 2 diabetes mellitus.  3.  Hypercholesterolemia   PAST SURGICAL HISTORY:  None.   FAMILY HISTORY:  Notable for the  absence of premature coronary artery  disease. The patient's mother suffered a stroke at age 42 and ultimately  died. The patient does have sisters who have undergone bypass surgery.   SOCIAL HISTORY:  The patient is married and lives with his wife in  [place]. 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 They have three grown children, all of whom live nearby  and are supportive. The patient has a remote history of tobacco use,  although he quit smoking in 1977. Prior to that he smoked fairly heavily.  The patient denies history of excessive alcohol consumption.   CURRENT MEDICATIONS:  1.  Toprol XL 50 mg daily.  2.  Hydrochlorothiazide 25 mg daily.  3.  Amaryl 4 mg daily.  4.  Glucophage 500 mg b.i.d.  5.  Altace 10 mg daily.  6.  Avandia 8 mg daily.  7.  Crestor 10 mg daily.  8.  Aspirin 81 mg daily.  9.  Co-enzyme 210, 100 mg daily.  10. Isosorbide mononitrate 30 mg daily.  11. Nitro-Quick sublingual tablets as needed for chest pain (none taken).   DRUG ALLERGIES:  None known.   PHYSICAL EXAMINATION:  GENERAL: The patient is a well-appearing, mildly  obese, white male who appears his stated age in no acute distress.  VITAL SIGNS: Blood pressure 140/84, pulse 80 and regular. He is afebrile  with oxygen saturation of 95% on room air.  HEENT: Grossly unrevealing.  NECK: Supple. There is no cervical or supraclavicular lymphadenopathy. There  is no jugular venous distention. No carotid bruits are noted.  CHEST: Clear to auscultation. Clear and symmetrical breath sounds  bilaterally. No wheezes or rhonchi are demonstrated.  CARDIOVASCULAR: Regular rate and rhythm. No murmurs, rubs, or gallops are  noted.  ABDOMEN: Soft, mildly obese, but nontender. There are no palpable masses.  The liver edge is not palpable. Bowel sounds are present.  EXTREMITIES: Warm and well perfused. There is no lower extremity edema. Distal pulses are palpable in both  lower legs at the ankle. There is no sign  of venous insufficiency. The skin is clean, dry, and healthy-appearing  throughout. Examination of the left hand is notable to intact palmar arch  circulation with normal capillary refill during brief occlusion of the  radial pulse.  RECTAL/GU EXAM: Both deferred.  NEUROLOGIC: Grossly nonfocal and symmetrical throughout. The remainder of  his physical exam is noncontributory.   LABORATORY DATA:  Bloodwork obtained prior to catheterization is reviewed  and notable for baseline hemoglobin of 13.3, hematocrit 40.7%, platelet  count 218,000, white blood cell count 6500. Basic metabolic panel includes  sodium of 134, potassium 4.1, chloride 98, bicarbonate 26, BUN 23,  creatinine 1.0, and a glucose of 152.   DIAGNOSTIC TESTS:  Cardiac catheterization performed on November 14th by Dr.  Antoine Poche is reviewed.  This demonstrates severe three-vessel coronary artery  disease with preserved left ventricular function. Specifically, there is  long segment 70% stenosis of the left anterior descending coronary artery.  There is 100% proximal occlusion of a large first circumflex marginal  branch. There is a subtotal 99% occlusion of the second circumflex marginal  branch. There is an 80% stenosis of the distal left circumflex coronary  artery. There is 100% proximal occlusion of the right coronary artery with  some collateral filling of the posterior descending artery coronary artery  via left to right collaterals. The left ventricular function  appears  preserved with an ejection fraction estimated at 65%. No significant wall  motion abnormalities are noted.   IMPRESSION:  Severe three-vessel coronary artery disease with preserved left  ventricular dysfunction. Mr. Stines describes minimal symptoms of only mild  exertional shortness of breath. He has an abnormal stress Cardiolite exam  with evidence for inferior wall ischemia and critical three-vessel  coronary  artery disease as described previously. I believe he would best be treated  by coronary artery bypass grafting.   PLAN:  I have discussed options at length with Mr. Richman and his two  daughters here in the office today. Alternative treatment strategies have  been discussed in detail. The relative risks and benefits of bypass grafting  have been discussed. The patient understands and accepts all associated  risks of surgery including, but not limited to risks of death, stroke,  myocardial infarction, congestive heart failure, respiratory failure,  pneumonia, bleeding requiring blood transfusion, arrhythmia, infection, and  recurrent coronary artery disease. All these questions have been addressed.  We tentatively plan to proceed with surgery on Friday, December 2nd. Mr.  Mosqueda  understands that during the interim period of time should he  develop any significant substernal chest pain or chest tightness that is unrelieved promptly by administration of sublingual nitroglycerin, he should  call EMS and present to the emergency room as soon as possible.        ___________________________________________  Salvatore Decent. Cornelius Moras, M.D.    CHO/MEDQ  D:  02/02/2004  T:  02/02/2004  Job:  [id number]   cc:   Olga Millers, M.D. [facility]   Rollene Rotunda, M.D.   [name], M.D. [facility]   Short Stay, Area A

## 2010-08-05 ENCOUNTER — Telehealth: Payer: Self-pay | Admitting: Pulmonary Disease

## 2010-08-05 MED ORDER — LISINOPRIL 10 MG PO TABS: 10.0000 mg | ORAL_TABLET | Freq: Every day | ORAL | Status: DC

## 2010-08-05 NOTE — Telephone Encounter (Signed)
Rx placed on SN's cart to be signed.

## 2010-08-06 NOTE — Telephone Encounter (Signed)
Rx signed and placed up front for pick up. Pt aware.

## 2010-08-19 ENCOUNTER — Other Ambulatory Visit: Payer: Self-pay | Admitting: Pulmonary Disease

## 2010-08-26 ENCOUNTER — Other Ambulatory Visit: Payer: Self-pay | Admitting: Cardiovascular Disease

## 2010-11-08 ENCOUNTER — Ambulatory Visit (INDEPENDENT_AMBULATORY_CARE_PROVIDER_SITE_OTHER): Payer: Medicare Other | Admitting: Pulmonary Disease

## 2010-11-08 ENCOUNTER — Encounter: Payer: Self-pay | Admitting: Pulmonary Disease

## 2010-11-08 ENCOUNTER — Other Ambulatory Visit (INDEPENDENT_AMBULATORY_CARE_PROVIDER_SITE_OTHER): Payer: Medicare Other

## 2010-11-08 DIAGNOSIS — K5730 Diverticulosis of large intestine without perforation or abscess without bleeding: Secondary | ICD-10-CM

## 2010-11-08 DIAGNOSIS — E1142 Type 2 diabetes mellitus with diabetic polyneuropathy: Secondary | ICD-10-CM

## 2010-11-08 DIAGNOSIS — E119 Type 2 diabetes mellitus without complications: Secondary | ICD-10-CM

## 2010-11-08 DIAGNOSIS — I10 Essential (primary) hypertension: Secondary | ICD-10-CM

## 2010-11-08 DIAGNOSIS — I509 Heart failure, unspecified: Secondary | ICD-10-CM

## 2010-11-08 DIAGNOSIS — I259 Chronic ischemic heart disease, unspecified: Secondary | ICD-10-CM

## 2010-11-08 DIAGNOSIS — G40209 Localization-related (focal) (partial) symptomatic epilepsy and epileptic syndromes with complex partial seizures, not intractable, without status epilepticus: Secondary | ICD-10-CM

## 2010-11-08 DIAGNOSIS — M1990 Unspecified osteoarthritis, unspecified site: Secondary | ICD-10-CM

## 2010-11-08 DIAGNOSIS — E785 Hyperlipidemia, unspecified: Secondary | ICD-10-CM

## 2010-11-08 DIAGNOSIS — I739 Peripheral vascular disease, unspecified: Secondary | ICD-10-CM

## 2010-11-08 DIAGNOSIS — I4891 Unspecified atrial fibrillation: Secondary | ICD-10-CM

## 2010-11-08 LAB — LIPID PANEL
Cholesterol: 118 mg/dL (ref 0–200)
HDL: 39.7 mg/dL (ref >39.00)
Total CHOL/HDL Ratio: 3
Triglycerides: 124 mg/dL (ref 0.0–149.0)

## 2010-11-08 LAB — BASIC METABOLIC PANEL
BUN: 20 mg/dL (ref 6–23)
CO2: 29 mEq/L (ref 19–32)
Calcium: 9.2 mg/dL (ref 8.4–10.5)
Chloride: 107 mEq/L (ref 96–112)
Creatinine, Ser: 1 mg/dL (ref 0.4–1.5)
Glucose, Bld: 167 mg/dL — ABNORMAL HIGH (ref 70–99)

## 2010-11-08 MED ORDER — EZETIMIBE 10 MG PO TABS: 10.0000 mg | ORAL_TABLET | Freq: Every day | ORAL | Status: DC

## 2010-11-08 NOTE — Progress Notes (Signed)
Subjective:    Patient ID: Fred [name], male    DOB: [birth year], 75 y.o.   MRN: [id number]  HPI 75 y/o WM here for a follow up visit...  he has multiple medical problems including HBP; CAD- s/p 4 vessel CABG in 2005; Hx PAF; Hyperlipidemia; DM on oral agents; Hx neuropathy; hx cerebrovasc dis & prev stroke w/ partial complex seizures; DJD, etc ...  he gets his meds from [facility]...   ~  May 11, 2009:  Cards f/u 11/10 w/ Myoview that was abnormal- subseq Cath showed 2/4 grafts occluded & med Rx recommended w/ Lasix & K20 started (improved dyspnea)...  his BP is controlled;  denies angina, SOB, edema;  Chol has been stable on meds;  sugars OK on med Rx w/ home BS ~90-120 by his report...  ~  November 09, 2009:  he just had f/u Fred [name] for Cards- K was 5.4 & KCl discontinued, otherw stable... he did fasting labs> reviewed w/ pt & he will continue same Rx... he denies intercurrent bronchial problems, BP controlled, no CP/ palpit/ etc, Chol & BS look good on Rx...   ~  May 10, 2010:  238mo ROV- doing well overall but hasn't lost any weight & he notes intermittent ?gout in left big toe (nothing active now & we discussed checking Uric w/ Rx as necessary);  he will use Aleve for knee & foot pain;  Uric=9.5 7 rec to start allopurinol 300mg /d...    BP controlled on meds; and denies CP, palpit, SOB, edema; he is too sedentary & we discussed incr exercise perscription for Cards rehab & wt reduction;  he denies cerebral ischemic symptoms or seizures...    Lipids are well regulated w/ his 3 meds + diet & exercise as noted> FLP shows TChol=115, TG=110, HDL=34, LDL=60> continue Simva40, [id number], Niaspan500...    Similarly DM has been well controlled on his 3 meds + diet & exercise (which clearly needs to be better)> Labs today shows BS=133, A1c=7.2 and he may need to incr meds; he denies neuropathy symptoms or pain;  U microalb=neg...  ~  November 08, 2010:  238mo ROV & he reports a good interval w/o new  complaints or concerns...    BP controlled on meds & he denies CP, palpit, dizzy, edema, etc;  No angina or AFib episodes;  Chol remains stable on the combination meds w/ Simva40, Zetia10, Niaspan;  DM under reasonable control w/ BS reported to be in the 120-130 range & we followed up his labs today- SEE BELOW;  He notes that his arthritis/ Gout is under good control w/ Allopurinol...          PROBLEM LIST:   BRONCHITIS, ACUTE WITH MILD BRONCHOSPASM (ICD-466.0) - he is an ex-smoker, quit in the 1970's... he denies cough, sputum, hemoptysis, worsening dyspnea, wheezing, chest pains, snoring, daytime hypersomnolence, etc...  ~  CXR 3/10 showed NAD- s/p med sternotomy, borderline hrt size, etc... ~  CXR 11/10 in hosp showed s/p CABG, low lung vols, basilar atx, NAD...  PAROTID LESION, UNSPECIFIED (ICD-239.0) - hx of bilat parotid hypertrophy without nodules... he states normal saliva, no dry mouth etc...  HYPERTENSION (ICD-401.9) - controlled on TOPROLXL 50mg /d, ZESTRIL 10mg /d, LASIX 40mg /d... BP= 116/70 and doing well denies HA, fatigue, visual changes, CP, palipit, dizziness, syncope, dyspnea, edema, etc... still not checking BP's at home.  C A D - S/P C A B G (ICD-414.9) - he had 4 vessel CABG in 12/05... he has left pleural effusion post-CABG... followed  by Fred [name] for cardiology on the above meds + ASA daily... working on secondary risk factor reductions... min exercise w/ yard work and we discussed the need for daily ex indoors... Fred [name] notes reviewed>> Diastolic CHF treated w/ Lasix/ KCl. ~  Baseline EKG showed NSR, NSSTTWA, NAD... ~  Myoview 11/10 showed scar inferiorly w/ ischemia superimposed... mild HK inferiorly w/ EF=63%... ~  Cath 11/10 showed diffuse 50% proxLAD w/ 100% occlusion in mid portion (distal filled via left IM graft), mild plaque in CIRC w/ marginal branches occluded at the ostia, 100% prox RCA- PDA fills from collateral flow>> 2/4 grafts occluded... MED Rx.  Hx of  PAROXYSMAL ATRIAL FIBRILLATION (ICD-427.31) - no CP, palpit, dizzy, etc...  PERIPHERAL VASCULAR DISEASE (ICD-443.9)  HYPERLIPIDEMIA (ICD-272.4) - on SIMVASTATIN 40mg /d, ZETIA 10mg /d, NIASPAN 500mg /d & CoQ10...  ~  FLP 7/08 showed TChol 113, TG 105, HDL 32, LDL 60 ~  FLP 3/09 showed TChol 132, TG 151, HDL 35, LDL 67 ~  FLP 8/09 showed TChol 95, TG 89, HDL 30, LDL 47... rec- continue same. ~  FLP 3/10 showed TChol 119, TG 84, HDL 37, LDL 65 ~  FLP 8/10 showed TChol 115, TG 92, HDL 37, LDL 59 ~  FLP 2/11 showed TChol 117, TG 101, HDL 43, LDL 54... same meds, low fat diet, get wt down, incr exercise! ~  FLP 8/11 showed TChol 116, TG 158, HDL 31, LDL 53 ~  FLP 2/12 showed TChol 115, TG 110, HDL 34, LDL 60 ~  FLP 8/12 showed TChol 118, TG 124, HDL 40, LDL 54...continue same, & work on wt reduction...  DIABETES MELLITUS (ICD-250.00) - on diet + METFORMIN 1000mg tabs- 1/2 tabBid, GLIMEPIRIDE 4mg tabs- 1/2 tab/d, & JANUVIA 100mg - 1/2 tab daily... states home checks all 90-120... ~  labs 7/08 showed FBS=94and HgA1c= 6.2 ~  labs 3/09 showed BS= 141, HgA1c= 6.9 ~  labs 8/09 showed BS= 106, HgA1c= 6.3.Marland KitchenMarland Kitchen rec- continue the same, get weight down! ~  labs 3/10 showed BS= 83, A1c= 6.5 ~  labs 8/10 showed BS= 72, A1c= 6.1.Marland Kitchen. let's try to save some $$ by decr the Actos & Januvia to 1/2/d... ~  labs 2/11 showed BS= 101, A1c= 6.1.Marland KitchenMarland Kitchen OK to stop the Actos. ~  labs 8/11 showed BS= 113-117 ~  Labs 2/11 showed BS= 133, A1c= 7.2 ~  Labs 8/12 showed BS= 167, A1c= 7.7.Marland KitchenMarland Kitchen  Rec> keep Metform1/2Bid but incr Glimep4mg /d & Januvia100mg /d...  DIVERTICULOSIS (ICD-562.10) - flex sig 1997 by Fred [name] showed divertics, int hem... ~  3/10: referred back to Fred [name] for colonoscopy- done 06/04/08 & showed divertics, hem, otherw neg... f/u rec Prn.  POLYNEUROPATHY (ICD-357.2) - diabetic vs other (he was in [place] w/ exposure there)...  Hx of STROKE (ICD-434.91) - hosp in 2006 w/ MRI showing sm vessel dis, old left thalamic  infarct, & atherosclerotic changes on MRA as well... he continues on ASA 81mg /d...  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 no problems in recent yrs.  DEGENERATIVE JOINT DISEASE & GOUT>> on ALLOPURINOL 300mg /d;  he uses OTC meds> Tylenol, Aleve, etc...  Hx of ANEMIA / OTHER (ICD-285.9) ~  labs 2/11 showed Hg= 13.5 ~  Labs 2/12 showed Hg= 14.3   Past Surgical History  Procedure Date  . Coronary artery bypass graft   . Removal of sebaceous cyst from neck 08/2007    Fred [name]    Outpatient Encounter Prescriptions as of 11/08/2010  Medication Sig Dispense Refill  . allopurinol (  ZYLOPRIM) 300 MG tablet Take 300 mg by mouth daily.        Marland Kitchen aspirin 81 MG tablet Take 81 mg by mouth daily.        . Coenzyme Q10 (COQ10) 30 MG CAPS Take 1 tablet by mouth daily.        Marland Kitchen ezetimibe (ZETIA) 10 MG tablet Take 10 mg by mouth daily.        . furosemide (LASIX) 40 MG tablet TAKE 1 TABLET BY MOUTH EVERY DAY  30 tablet  10  . glimepiride (AMARYL) 4 MG tablet Take 1/2 tablet by mouth once daily  ==> INCREASE TO 4mg /d...      . JANUVIA 100 MG tablet TAKE 1 TABLET BY MOUTH EVERY DAY ==> BE SURE TAKING 1 TAB DAILY...  90 tablet  0  . lisinopril (PRINIVIL,ZESTRIL) 10 MG tablet Take 1 tablet (10 mg total) by mouth daily.  90 tablet  3  . metFORMIN (GLUCOPHAGE) 1000 MG tablet Take 1/2 tablet by mouth two times daily       . metoprolol (TOPROL-XL) 50 MG 24 hr tablet Take 50 mg by mouth daily.        . niacin (NIASPAN) 500 MG CR tablet Take 500 mg by mouth at bedtime.        . simvastatin (ZOCOR) 40 MG tablet Take 40 mg by mouth at bedtime.          No Known Allergies   Current Medications, Allergies, Past Medical History, Past Surgical History, Family History, and Social History were reviewed in [employer] record.    Review of Systems         See HPI - all other systems neg except as noted...  The patient complains of  dyspnea on exertion and difficulty walking.  The patient denies anorexia, fever, weight loss, weight gain, vision loss, decreased hearing, hoarseness, chest pain, syncope, peripheral edema, prolonged cough, headaches, hemoptysis, abdominal pain, melena, hematochezia, severe indigestion/heartburn, hematuria, incontinence, muscle weakness, suspicious skin lesions, transient blindness, depression, unusual weight change, abnormal bleeding, enlarged lymph nodes, and angioedema.     Objective:   Physical Exam      WD, WN, 75 y/o WM in NAD... GENERAL:  Alert & oriented; pleasant & cooperative... HEENT:  [place]/AT, EOM-wnl, PERRLA, EACs-clear, TMs-wnl, NOSE-clear, THROAT-clear & wnl. NECK:  Supple w/ fairROM; no JVD; normal carotid impulses w/o bruits; no thyromegaly or nodules palpated; no lymphadenopathy. CHEST:  Clear to P & A... no rales or signs of consolidation... HEART:  Regular Rhythm; without murmurs/ rubs/ or gallops heard... ABDOMEN:  Obese, soft & nontender; normal bowel sounds; no organomegaly or masses detected. EXT: without deformities, mild arthritic changes; no varicose veins/ +venous insuffic/ no edema. NEURO:  CN's intact; no focal neuro deficits... DERM:  No lesions noted; no rash etc...   Assessment & Plan:   HBP>  Well controlled on 3 med regimen, continue same...  CAD, s/p CABG>  Stable w/o angina; he has purchased an exercise bike & setting it up now, coontinue same regimen...  Hx PAF>  Aware, he has been holding NSR w/o palpit, irregularity etc...  CHOL>  FLP looks great, continue same 3 meds...  DM>  A1c is worse at 7.7> rec to incr the Glimep to 4mg  Qam & the Januvia to 100mg  Qpm at dinner; keep the Metform 500mg  bid.  GI> Divertics, Hems>  Stable & last colon 2010 was OK...  DJD/ GOUT>  Improved on the Allopurinol he says, uses OTC  anal;gesics as needed...  Neuropathy>  DM vs other etiology...  Hx Stroke & Partial complex seizures>  On ASA 81mg /d w/o recurrent  symptoms, continue same.Marland KitchenMarland Kitchen

## 2010-11-08 NOTE — Patient Instructions (Signed)
Today we updated your med list in EPIC...    Continue your current meds the same...  Keep up the good work w/ diet & exercise, work on weight reduction...  Today we did your follow up fasting blood work...    Please call the PHONE TREE in a few days for your results...    Dial [id number] & when prompted enter your patient number followed by the # symbol...    Your patient number is:  [id number]  Call for any problems...  Let's plan a follow up visit in 6 months.Marland KitchenMarland Kitchen

## 2010-11-09 ENCOUNTER — Other Ambulatory Visit: Payer: Self-pay | Admitting: *Deleted

## 2010-11-09 MED ORDER — GLIMEPIRIDE 4 MG PO TABS: 4.0000 mg | ORAL_TABLET | Freq: Every day | ORAL | Status: DC

## 2010-12-09 LAB — DIFFERENTIAL
Basophils Absolute: 0
Basophils Relative: 1
Eosinophils Absolute: 0.1
Monocytes Relative: 8
Neutro Abs: 3
Neutrophils Relative %: 52

## 2010-12-09 LAB — BASIC METABOLIC PANEL
BUN: 16
CO2: 25
Calcium: 9.3
Chloride: 106
Creatinine, Ser: 0.92
Glucose, Bld: 115 — ABNORMAL HIGH

## 2010-12-09 LAB — CBC
MCHC: 34.1
MCV: 93.1
RDW: 13

## 2010-12-24 ENCOUNTER — Other Ambulatory Visit: Payer: Self-pay | Admitting: Pulmonary Disease

## 2011-02-07 ENCOUNTER — Telehealth: Payer: Self-pay | Admitting: Pulmonary Disease

## 2011-02-07 MED ORDER — NIACIN ER (ANTIHYPERLIPIDEMIC) 500 MG PO TBCR: 500.0000 mg | EXTENDED_RELEASE_TABLET | Freq: Every day | ORAL | Status: DC

## 2011-02-07 NOTE — Telephone Encounter (Signed)
Addended by: Darrell Jewel on: 02/07/2011 03:56 PM   Modules accepted: Orders

## 2011-02-07 NOTE — Telephone Encounter (Signed)
Addended by: Darrell Jewel on: 02/07/2011 04:05 PM   Modules accepted: Orders

## 2011-02-07 NOTE — Telephone Encounter (Signed)
I spoke with pt wife an verified what medication was needed and she states niacin. Rx printed and placed on SN look-at to sig. Pt will come by tomorrow to pick-up rx. Pt aware it will be at front. Carron Curie, CMA

## 2011-02-07 NOTE — Telephone Encounter (Signed)
LMOMTCB x 1 

## 2011-02-07 NOTE — Telephone Encounter (Signed)
Pt's wife returned triage's call & can be reached at [phone].  Fred [name]

## 2011-05-02 ENCOUNTER — Other Ambulatory Visit: Payer: Self-pay | Admitting: Pulmonary Disease

## 2011-05-11 ENCOUNTER — Ambulatory Visit (INDEPENDENT_AMBULATORY_CARE_PROVIDER_SITE_OTHER)
Admission: RE | Admit: 2011-05-11 | Discharge: 2011-05-11 | Disposition: A | Discharge status: Home / Self Care | Payer: Medicare Other | Source: Ambulatory Visit | Attending: Pulmonary Disease | Admitting: Pulmonary Disease

## 2011-05-11 ENCOUNTER — Other Ambulatory Visit (INDEPENDENT_AMBULATORY_CARE_PROVIDER_SITE_OTHER): Payer: Medicare Other

## 2011-05-11 ENCOUNTER — Ambulatory Visit (INDEPENDENT_AMBULATORY_CARE_PROVIDER_SITE_OTHER): Payer: TRICARE For Life (TFL) | Admitting: Pulmonary Disease

## 2011-05-11 ENCOUNTER — Encounter: Payer: Self-pay | Admitting: Pulmonary Disease

## 2011-05-11 VITALS — BP 138/72 | HR 77 | Temp 98.0°F | Ht 63.0 in | Wt 194.0 lb

## 2011-05-11 DIAGNOSIS — I6350 Cerebral infarction due to unspecified occlusion or stenosis of unspecified cerebral artery: Secondary | ICD-10-CM

## 2011-05-11 DIAGNOSIS — I4891 Unspecified atrial fibrillation: Secondary | ICD-10-CM

## 2011-05-11 DIAGNOSIS — I10 Essential (primary) hypertension: Secondary | ICD-10-CM | POA: Diagnosis not present

## 2011-05-11 DIAGNOSIS — D649 Anemia, unspecified: Secondary | ICD-10-CM

## 2011-05-11 DIAGNOSIS — E119 Type 2 diabetes mellitus without complications: Secondary | ICD-10-CM

## 2011-05-11 DIAGNOSIS — E785 Hyperlipidemia, unspecified: Secondary | ICD-10-CM

## 2011-05-11 DIAGNOSIS — G40209 Localization-related (focal) (partial) symptomatic epilepsy and epileptic syndromes with complex partial seizures, not intractable, without status epilepticus: Secondary | ICD-10-CM

## 2011-05-11 DIAGNOSIS — N139 Obstructive and reflux uropathy, unspecified: Secondary | ICD-10-CM

## 2011-05-11 DIAGNOSIS — M1990 Unspecified osteoarthritis, unspecified site: Secondary | ICD-10-CM

## 2011-05-11 DIAGNOSIS — E1149 Type 2 diabetes mellitus with other diabetic neurological complication: Secondary | ICD-10-CM

## 2011-05-11 DIAGNOSIS — K5730 Diverticulosis of large intestine without perforation or abscess without bleeding: Secondary | ICD-10-CM

## 2011-05-11 DIAGNOSIS — I259 Chronic ischemic heart disease, unspecified: Secondary | ICD-10-CM | POA: Diagnosis not present

## 2011-05-11 DIAGNOSIS — E1142 Type 2 diabetes mellitus with diabetic polyneuropathy: Secondary | ICD-10-CM

## 2011-05-11 LAB — BASIC METABOLIC PANEL
BUN: 32 mg/dL — ABNORMAL HIGH (ref 6–23)
Calcium: 10 mg/dL (ref 8.4–10.5)
GFR: 66.06 mL/min (ref >60.00)
Glucose, Bld: 220 mg/dL — ABNORMAL HIGH (ref 70–99)
Potassium: 5.2 mEq/L — ABNORMAL HIGH (ref 3.5–5.1)

## 2011-05-11 LAB — LIPID PANEL
Cholesterol: 143 mg/dL (ref 0–200)
HDL: 38.6 mg/dL — ABNORMAL LOW (ref >39.00)
Triglycerides: 151 mg/dL — ABNORMAL HIGH (ref 0.0–149.0)
VLDL: 30.2 mg/dL (ref 0.0–40.0)

## 2011-05-11 LAB — MICROALBUMIN / CREATININE URINE RATIO
Creatinine,U: 27 mg/dL
Microalb Creat Ratio: 5.2 mg/g (ref 0.0–30.0)
Microalb, Ur: 1.4 mg/dL (ref 0.0–1.9)

## 2011-05-11 LAB — CBC WITH DIFFERENTIAL/PLATELET
Eosinophils Relative: 2.9 % (ref 0.0–5.0)
HCT: 46.7 % (ref 39.0–52.0)
Lymphs Abs: 2.5 10*3/uL (ref 0.7–4.0)
Monocytes Relative: 8.9 % (ref 3.0–12.0)
Platelets: 243 10*3/uL (ref 150.0–400.0)
WBC: 8.8 10*3/uL (ref 4.5–10.5)

## 2011-05-11 LAB — HEPATIC FUNCTION PANEL
AST: 18 U/L (ref 0–37)
Albumin: 4.3 g/dL (ref 3.5–5.2)

## 2011-05-11 LAB — PSA: PSA: 3.34 ng/mL (ref 0.10–4.00)

## 2011-05-11 LAB — TSH: TSH: 1.32 u[IU]/mL (ref 0.35–5.50)

## 2011-05-11 LAB — HEMOGLOBIN A1C: Hgb A1c MFr Bld: 9.3 % — ABNORMAL HIGH (ref 4.6–6.5)

## 2011-05-11 NOTE — Patient Instructions (Signed)
Today we updated your med list in our EPIC system...    Continue your current medications the same...  Today we did your follow up CXR & fasting blood work...    Please call the PHONE TREE in a few days for your results...    Dial [id number] & when prompted enter your patient number followed by the # symbol...    Your patient number is:  [id number]  Call for any questions...  Let's plan another follow up visit in 6 months.Marland KitchenMarland Kitchen

## 2011-05-11 NOTE — Progress Notes (Addendum)
Subjective:    Patient ID: Fred [name], male    DOB: Mar 12, 1936, 76 y.o.   MRN: [id number]  HPI 76 y/o WM here for a follow up visit...  he has multiple medical problems including HBP; CAD- s/p 4 vessel CABG in 2005; Hx PAF; Hyperlipidemia; DM on oral agents; Hx neuropathy; hx cerebrovasc dis & prev stroke w/ partial complex seizures; DJD, etc ...  he gets his meds from [facility]...   ~  May 10, 2010:  76mo ROV- doing well overall but hasn't lost any weight & he notes intermittent ?gout in left big toe (nothing active now & we discussed checking Uric w/ Rx as necessary);  he will use Aleve for knee & foot pain;  Uric=9.5 7 rec to start allopurinol 300mg /d...    BP controlled on meds; and denies CP, palpit, SOB, edema; he is too sedentary & we discussed incr exercise perscription for [facility] rehab & wt reduction;  he denies cerebral ischemic symptoms or seizures...    Lipids are well regulated w/ his 3 meds + diet & exercise as noted> FLP shows TChol=115, TG=110, HDL=34, LDL=60> continue Simva40, BJYNW29, Niaspan500...    Similarly DM has been well controlled on his 3 meds + diet & exercise (which clearly needs to be better)> Labs today shows BS=133, A1c=7.2 and he may need to incr meds; he denies neuropathy symptoms or pain;  U microalb=neg...  ~  November 08, 2010:  76mo ROV & he reports a good interval w/o new complaints or concerns...    BP controlled on meds & he denies CP, palpit, dizzy, edema, etc;  No angina or AFib episodes;  Chol remains stable on the combination meds w/ Simva40, Zetia10, Niaspan;  DM under reasonable control w/ BS reported to be in the 120-130 range & we followed up his labs today- SEE BELOW;  He notes that his arthritis/ Gout is under good control w/ Allopurinol...  ~  May 11, 2011:  74mo ROV & Fred [name] reports a stable 76mo w/o new complaints or concerns;  Cardiac stable on BBlocker, ACE, & Diuretic; he denies CP, palpit, ch in SOB, edema, etc;  Chol is fair on his  combination therapy w/ Simva40, Zetia10, & Niaspan500- see below, needs better diet & wt reduction;  But his DM control is poor on his regimen & we will up it to the max prior to starting insulin Rx (note> wt stable at 194#).Marland KitchenMarland Kitchen  See prob list below >> CXR 2/13 showed borderline heart size & prior CABG. Mild COPD, NAD... LABS 2/13:  FLP- at goals on Simva40 Zetia10 Niaspan500;  Chems- ok x BS=220 A1c=9.3 on ;  CBC- wnl;  TSH=1.32;  PSA=3.34;  Umicroalb= neg.          PROBLEM LIST:    PROBLEM LIST UPDATED 05/11/11 >>  BRONCHITIS, ACUTE WITH MILD BRONCHOSPASM (ICD-466.0) - he is an ex-smoker, quit in the 1970's... he denies cough, sputum, hemoptysis, worsening dyspnea, wheezing, chest pains, snoring, daytime hypersomnolence, etc...  ~  CXR 3/10 showed NAD- s/p med sternotomy, borderline hrt size, etc... ~  CXR 11/10 in hosp showed s/p CABG, low lung vols, basilar atx, NAD.Marland Kitchen. ~  CXR 2/13 showed borderline heart size & prior CABG. Mild COPD, NAD...  PAROTID LESION, UNSPECIFIED (ICD-239.0) - hx of bilat parotid hypertrophy without nodules... he states normal saliva, no dry mouth etc...  HYPERTENSION (ICD-401.9) - controlled on TOPROLXL 50mg /d, ZESTRIL 10mg /d, LASIX 40mg /d...  ~  8/12:  BP= 116/70 and doing well denies  HA, fatigue, visual changes, CP, palipit, dizziness, syncope, dyspnea, edema, etc... still not checking BP's at home. ~  2/13:  BP= 138/72 & doing satis w/o CP, palpit, dizzy, ch in SOB, edema, etc...  C A D - S/P C A B G (ICD-414.9) - he had 4 vessel CABG in 12/05... he has left pleural effusion post-CABG... followed by Aletta Edouard for cardiology on the above meds + ASA daily... working on secondary risk factor reductions... min exercise w/ yard work and we discussed the need for daily ex indoors... DrCrenshaws notes reviewed>> Diastolic CHF treated w/ Lasix/ KCl. ~  Baseline EKG showed NSR, LAD, NAD... ~  Myoview 11/10 showed scar inferiorly w/ ischemia superimposed... mild HK  inferiorly w/ EF=63%... ~  Cath 11/10 showed diffuse 50% proxLAD w/ 100% occlusion in mid portion (distal filled via left IM graft), mild plaque in CIRC w/ marginal branches occluded at the ostia, 100% prox RCA- PDA fills from collateral flow>> 2/4 grafts occluded... MED Rx. ~  He is overdue for f/u w/ Cards (last seen 8/11) & we will refer back to DrCrenshaw...  Hx of PAROXYSMAL ATRIAL FIBRILLATION (ICD-427.31) - no CP, palpit, dizzy, etc; appears to be maintaining NSR w/o arrhythmias...  PERIPHERAL VASCULAR DISEASE (ICD-443.9) - known small vessel dis on prev MRI in 2006 & concomitant MRA showed prominent intracranial atherosclerotic changes; on ASA daily.  HYPERLIPIDEMIA (ICD-272.4) - on SIMVASTATIN 40mg /d, ZETIA 10mg /d, NIASPAN 500mg /d & CoQ10...  ~  FLP 7/08 showed TChol 113, TG 105, HDL 32, LDL 60 ~  FLP 3/09 showed TChol 132, TG 151, HDL 35, LDL 67 ~  FLP 8/09 showed TChol 95, TG 89, HDL 30, LDL 47... rec- continue same . ~  FLP 3/10 showed TChol 119, TG 84, HDL 37, LDL 65 ~  FLP 8/10 showed TChol 115, TG 92, HDL 37, LDL 59 ~  FLP 2/11 showed TChol 117, TG 101, HDL 43, LDL 54... same meds, low fat diet, get wt down, incr exercise! ~  FLP 8/11 showed TChol 116, TG 158, HDL 31, LDL 53 ~  FLP 2/12 showed TChol 115, TG 110, HDL 34, LDL 60 ~  FLP 8/12 showed TChol 118, TG 124, HDL 40, LDL 54...continue same , & work on wt reduction... ~  FLP 2/13 showed TChol 143, TG 151, HDL 39, LDL 74  DIABETES MELLITUS (ICD-250.00) - on diet + METFORMIN 1000mg tabs- 1/2 tabBid, GLIMEPIRIDE 4mg tab/d, & JANUVIA 100mg  daily... states home checks all 90-120... ~  labs 7/08 showed FBS=94and HgA1c= 6.2 ~  labs 3/09 showed BS= 141, HgA1c= 6.9 ~  labs 8/09 showed BS= 106, HgA1c= 6.3.Marland KitchenMarland Kitchen rec- continue the same, get weight down! ~  labs 3/10 showed BS= 83, A1c= 6.5 ~  labs 8/10 showed BS= 72, A1c= 6.1.Marland Kitchen. let's try to save some $$ by decr the Actos & Januvia to 1/2/d... ~  9/10: +Diabetic Retinopathy per  Ophthalmology in [place]> reminded to f/u yeqarly. ~  labs 2/11 showed BS= 101, A1c= 6.1.Marland KitchenMarland Kitchen OK to stop the Actos. ~  labs 8/11 showed BS= 113-117 ~  Labs 2/12 showed BS= 133, A1c= 7.2 ~  Labs 8/12 showed BS= 167, A1c= 7.7.Marland KitchenMarland Kitchen  Rec> keep Metform1/2Bid but incr Glimep4mg /d & Januvia100mg /d... ~  Labs 2/13 showed BS= 220, A1c= 9.3.Marland KitchenMarland Kitchen Rec> maximize meds= Metform1000Bid, Glim4Bid, Januv100/d.  DIVERTICULOSIS (ICD-562.10) - flex sig 1997 by DrStark showed divertics, int hem... ~  3/10: referred back to GI for colonoscopy- done 06/04/08 & showed divertics, hem, otherw neg... f/u rec Prn.  POLYNEUROPATHY (ICD-357.2) -  diabetic vs other (he was in [place] w/ exposure there)...  Hx of STROKE (ICD-434.91) - hosp in 2006 w/ MRI showing sm vessel dis, old left thalamic infarct, & atherosclerotic changes on MRA as well... he continues on ASA 81mg /d...  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 no problems in recent yrs.  DEGENERATIVE JOINT DISEASE & GOUT>> on ALLOPURINOL 300mg /d;  he uses OTC meds> Tylenol, Aleve, etc...  Hx of ANEMIA / OTHER (ICD-285.9) ~  labs 2/11 showed Hg= 13.5 ~  Labs 2/12 showed Hg= 14.3 ~  Labs 2/13 showed Hg= 15.7   Past Surgical History  Procedure Date  . Coronary artery bypass graft   . Removal of sebaceous cyst from neck 08/2007    Dr. Luisa Hart    Outpatient Encounter Prescriptions as of 05/11/2011  Medication Sig Dispense Refill  . allopurinol (ZYLOPRIM) 300 MG tablet Take 300 mg by mouth daily.        Marland Kitchen aspirin 81 MG tablet Take 81 mg by mouth daily.        . Coenzyme Q10 (COQ10) 30 MG CAPS Take 1 tablet by mouth daily.        Marland Kitchen ezetimibe (ZETIA) 10 MG tablet Take 1 tablet (10 mg total) by mouth daily.  90 tablet  3  . furosemide (LASIX) 40 MG tablet TAKE 1 TABLET BY MOUTH EVERY DAY  30 tablet  10  . glimepiride (AMARYL) 4 MG tablet TAKE 1 TABLET BY MOUTH EVERY DAY BEFORE BREAKFAST  30 tablet  3  . JANUVIA 100 MG  tablet TAKE 1 TABLET BY MOUTH EVERY DAY  90 tablet  0  . lisinopril (PRINIVIL,ZESTRIL) 10 MG tablet Take 1 tablet (10 mg total) by mouth daily.  90 tablet  3  . metFORMIN (GLUCOPHAGE) 1000 MG tablet Take 1/2 tablet by mouth two times daily       . metoprolol (TOPROL-XL) 50 MG 24 hr tablet TAKE 1 TABLET BY MOUTH EVERY DAY  90 tablet  2  . niacin (NIASPAN) 500 MG CR tablet Take 1 tablet (500 mg total) by mouth at bedtime.  90 tablet  4  . simvastatin (ZOCOR) 40 MG tablet Take 40 mg by mouth at bedtime.          No Known Allergies   Current Medications, Allergies, Past Medical History, Past Surgical History, Family History, and Social History were reviewed in [employer] record.    Review of Systems         See HPI - all other systems neg except as noted...  The patient complains of dyspnea on exertion and difficulty walking.  The patient denies anorexia, fever, weight loss, weight gain, vision loss, decreased hearing, hoarseness, chest pain, syncope, peripheral edema, prolonged cough, headaches, hemoptysis, abdominal pain, melena, hematochezia, severe indigestion/heartburn, hematuria, incontinence, muscle weakness, suspicious skin lesions, transient blindness, depression, unusual weight change, abnormal bleeding, enlarged lymph nodes, and angioedema.     Objective:   Physical Exam      WD, WN, 76 y/o WM in NAD... GENERAL:  Alert & oriented; pleasant & cooperative... HEENT:  [place]/AT, EOM-wnl, PERRLA, EACs-clear, TMs-wnl, NOSE-clear, THROAT-clear & wnl. NECK:  Supple w/ fairROM; no JVD; normal carotid impulses w/o bruits; no thyromegaly or nodules palpated; no lymphadenopathy. CHEST:  Clear to P & A... no rales or signs of consolidation... HEART:  Regular Rhythm; without murmurs/ rubs/ or gallops heard... ABDOMEN:  Obese, soft & nontender; normal bowel sounds; no organomegaly  or masses detected. EXT: without deformities, mild arthritic changes; no varicose veins/  +venous insuffic/ no edema. NEURO:  CN's intact; no focal neuro deficits... DERM:  No lesions noted; no rash etc...  RADIOLOGY DATA:  Reviewed in the EPIC EMR & discussed w/ the patient...  LABORATORY DATA:  Reviewed in the EPIC EMR & discussed w/ the patient...   Assessment & Plan:   DM Retinopathy>  Reminded to get yearly eye exams...  HBP>  Well controlled on 3 med regimen, continue same...  CAD, s/p CABG>  Stable w/o angina; he has purchased an exercise bike & setting it up now, continue same regimen...  Hx PAF>  Aware, he has been holding NSR w/o palpit, irregularity etc...  CHOL>  FLP looks great, continue same 3 meds...  DM>  A1c is much worse at 9.3 now> rec to incr the Metform to 1000mg Bid, Glimep to 4mg Bid & the Januvia to 100mg  Qpm at dinner; better diet & wt redction...  GI> Divertics, Hems>  Stable & last colon 2010 was OK...  DJD/ GOUT>  Improved on the Allopurinol he says, uses OTC anal;gesics as needed...  Neuropathy>  DM vs other etiology...  Hx Stroke & Partial complex seizures>  On ASA 81mg /d w/o recurrent symptoms, continue same...   Patient's Medications  New Prescriptions   No medications on file  Previous Medications   ALLOPURINOL (ZYLOPRIM) 300 MG TABLET    Take 300 mg by mouth daily.     ASPIRIN 81 MG TABLET    Take 81 mg by mouth daily.     COENZYME Q10 (COQ10) 30 MG CAPS    Take 1 tablet by mouth daily.     EZETIMIBE (ZETIA) 10 MG TABLET    Take 1 tablet (10 mg total) by mouth daily.   FUROSEMIDE (LASIX) 40 MG TABLET    TAKE 1 TABLET BY MOUTH EVERY DAY   JANUVIA 100 MG TABLET    TAKE 1 TABLET BY MOUTH EVERY DAY   LISINOPRIL (PRINIVIL,ZESTRIL) 10 MG TABLET    Take 1 tablet (10 mg total) by mouth daily.   METOPROLOL (TOPROL-XL) 50 MG 24 HR TABLET    TAKE 1 TABLET BY MOUTH EVERY DAY   NIACIN (NIASPAN) 500 MG CR TABLET    Take 1 tablet (500 mg total) by mouth at bedtime.  Modified Medications   Modified Medication Previous Medication   GLIMEPIRIDE  (AMARYL) 4 MG TABLET glimepiride (AMARYL) 4 MG tablet      Take 1 tablet (4 mg total) by mouth 2 (two) times daily with a meal.    TAKE 1 TABLET BY MOUTH EVERY DAY BEFORE BREAKFAST   METFORMIN (GLUCOPHAGE) 1000 MG TABLET metFORMIN (GLUCOPHAGE) 1000 MG tablet      Take 1 tablet (1,000 mg total) by mouth 2 (two) times daily with a meal.    Take 1 tablet (1,000 mg total) by mouth 2 (two) times daily with a meal. Take 1/2 tablet by mouth two times daily   SIMVASTATIN (ZOCOR) 40 MG TABLET simvastatin (ZOCOR) 40 MG tablet      Take 1 tablet (40 mg total) by mouth at bedtime.    Take 40 mg by mouth at bedtime.    Discontinued Medications   METFORMIN (GLUCOPHAGE) 1000 MG TABLET    Take 1/2 tablet by mouth two times daily

## 2011-05-17 ENCOUNTER — Telehealth: Payer: Self-pay | Admitting: Pulmonary Disease

## 2011-05-17 MED ORDER — SIMVASTATIN 40 MG PO TABS: 40.0000 mg | ORAL_TABLET | Freq: Every day | ORAL | Status: DC

## 2011-05-17 MED ORDER — METFORMIN HCL 1000 MG PO TABS: 1000.0000 mg | ORAL_TABLET | Freq: Two times a day (BID) | ORAL | Status: DC

## 2011-05-17 NOTE — Telephone Encounter (Signed)
Called and spoke with pts wife and she is aware that rx for the pt have been printed out and will be signed by SN and placed up front to be picked up.

## 2011-05-18 ENCOUNTER — Telehealth: Payer: Self-pay | Admitting: *Deleted

## 2011-05-19 NOTE — Telephone Encounter (Signed)
error 

## 2011-05-24 ENCOUNTER — Telehealth: Payer: Self-pay | Admitting: Pulmonary Disease

## 2011-05-24 NOTE — Telephone Encounter (Signed)
Pharmacist at [facility] is needing clarification on Metformin rx.  Reviewed pt's MAR and it states 1tab po bid AND ALSO STATES 1/2 tab po bid.   Reviewed last labs pt had done on 05/11/11 and it states Metformin 2bid.  Therefore, unsure which directions are correct.  Please advise which is correct.  Thanks!

## 2011-05-25 MED ORDER — METFORMIN HCL 1000 MG PO TABS: 1000.0000 mg | ORAL_TABLET | Freq: Two times a day (BID) | ORAL | Status: DC

## 2011-05-25 MED ORDER — GLIMEPIRIDE 4 MG PO TABS: 4.0000 mg | ORAL_TABLET | Freq: Two times a day (BID) | ORAL | Status: DC

## 2011-05-25 NOTE — Telephone Encounter (Signed)
Called pharmacy at [facility], was on hold x5 minutes.  Spoke with pharmacist Ray who stated he will relay pt's medications and directions to [facility] and have her call back.  Called spoke with patient's wife, informed her of the phone call from pharmacy.  Mrs Deese stated that when patient went to fill his rx, he told them that SN increased his metformin to 1 whole tab twice daily.  Also advised that when pt returns for follow up in Aug, needs to bring all medication bottles with him.  Pt wife verbalized her understanding.    MAR updated/corrected.

## 2011-05-25 NOTE — Telephone Encounter (Signed)
Per SN---new rx   Metformin 1000mg   Bid   Glimepiride  4mg   Bid and januvia 100mg  daily.   Pt will need to make sure that he brings all of his meds to his next appt so we can make sure that he is taking the correct dosages of his meds.  thanks

## 2011-06-01 DIAGNOSIS — R972 Elevated prostate specific antigen [PSA]: Secondary | ICD-10-CM | POA: Diagnosis not present

## 2011-06-27 ENCOUNTER — Other Ambulatory Visit: Payer: Self-pay | Admitting: Pulmonary Disease

## 2011-06-27 DIAGNOSIS — I10 Essential (primary) hypertension: Secondary | ICD-10-CM

## 2011-06-27 DIAGNOSIS — I259 Chronic ischemic heart disease, unspecified: Secondary | ICD-10-CM

## 2011-06-27 DIAGNOSIS — I4891 Unspecified atrial fibrillation: Secondary | ICD-10-CM

## 2011-07-07 ENCOUNTER — Other Ambulatory Visit: Payer: Self-pay | Admitting: Cardiovascular Disease

## 2011-07-26 ENCOUNTER — Encounter: Payer: Self-pay | Admitting: Cardiology

## 2011-07-26 ENCOUNTER — Ambulatory Visit (INDEPENDENT_AMBULATORY_CARE_PROVIDER_SITE_OTHER): Payer: Medicare Other | Admitting: Cardiology

## 2011-07-26 VITALS — BP 116/65 | HR 76 | Ht 63.0 in | Wt 190.0 lb

## 2011-07-26 DIAGNOSIS — E785 Hyperlipidemia, unspecified: Secondary | ICD-10-CM | POA: Diagnosis not present

## 2011-07-26 DIAGNOSIS — I259 Chronic ischemic heart disease, unspecified: Secondary | ICD-10-CM

## 2011-07-26 DIAGNOSIS — I2510 Atherosclerotic heart disease of native coronary artery without angina pectoris: Secondary | ICD-10-CM | POA: Diagnosis not present

## 2011-07-26 DIAGNOSIS — I10 Essential (primary) hypertension: Secondary | ICD-10-CM | POA: Diagnosis not present

## 2011-07-26 DIAGNOSIS — I509 Heart failure, unspecified: Secondary | ICD-10-CM

## 2011-07-26 NOTE — Progress Notes (Signed)
HPI: [name] is a pleasant gentleman who has a history of coronary artery disease status post coronary artery bypass graft in December 2005. I saw him for an abnormal Myoview in November of 2010. There was a prior inferior infarct with moderate peri-infarct ischemia. Ejection fraction was 63%. We scheduled a cardiac catheterization. This was performed on February 02, 2009. He had an ejection fraction of 55% but his filling pressures were elevated with a LVEDP of 27. 2 of 4 grafts were patent. He was started on diuretics at that time. I last saw him in Nov 2011. Since then, the patient has dyspnea with more extreme activities but not with routine activities. It is relieved with rest. It is not associated with chest pain. There is no orthopnea, PND or pedal edema. There is no syncope or palpitations. There is no exertional chest pain.   Current Outpatient Prescriptions  Medication Sig Dispense Refill  . allopurinol (ZYLOPRIM) 300 MG tablet Take 300 mg by mouth daily.        Marland Kitchen aspirin 81 MG tablet Take 81 mg by mouth daily.        . Coenzyme Q10 (COQ10) 30 MG CAPS Take 1 tablet by mouth daily.        Marland Kitchen ezetimibe (ZETIA) 10 MG tablet Take 1 tablet (10 mg total) by mouth daily.  90 tablet  3  . furosemide (LASIX) 40 MG tablet TAKE 1 TABLET BY MOUTH EVERY DAY  30 tablet  1  . glimepiride (AMARYL) 4 MG tablet Take 1 tablet (4 mg total) by mouth 2 (two) times daily with a meal.      . JANUVIA 100 MG tablet TAKE 1 TABLET BY MOUTH EVERY DAY  90 tablet  0  . lisinopril (PRINIVIL,ZESTRIL) 10 MG tablet Take 1 tablet (10 mg total) by mouth daily.  90 tablet  3  . metFORMIN (GLUCOPHAGE) 1000 MG tablet Take 1 tablet (1,000 mg total) by mouth 2 (two) times daily with a meal.      . metoprolol (TOPROL-XL) 50 MG 24 hr tablet TAKE 1 TABLET BY MOUTH EVERY DAY  90 tablet  2  . niacin (NIASPAN) 500 MG CR tablet Take 1 tablet (500 mg total) by mouth at bedtime.  90 tablet  4  . simvastatin (ZOCOR) 40 MG tablet Take  1 tablet (40 mg total) by mouth at bedtime.  90 tablet  3     Past Medical History  Diagnosis Date  . 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 Hypertension   . CAD (coronary artery disease)     s/p cabg  . CHF (congestive heart failure)   . Peripheral vascular disease   . History of stroke   . Hyperlipidemia   . DM (diabetes mellitus)   . Diverticulosis   . Polyneuropathy, diabetic   . Seizure disorder, complex partial   . DJD (degenerative joint disease)   . History of anemia   . Atrial fibrillation     Post operative    Past Surgical History  Procedure Date  . Coronary artery bypass graft   . Removal of sebaceous cyst from neck 08/2007    Dr. Luisa Hart    History   Social History  . Marital Status: Married    Spouse Name: Fred [name]    Number of Children: 3  . Years of Education: N/A   Occupational History  . Not on file.   Social History Main Topics  . Smoking status: Former  Smoker    Quit date: 03/14/1977  . Smokeless tobacco: Not on file  . Alcohol Use: No  . Drug Use: No  . Sexually Active: Not on file   Other Topics Concern  . Not on file   Social History Narrative  . No narrative on file    ROS: no fevers or chills, productive cough, hemoptysis, dysphasia, odynophagia, melena, hematochezia, dysuria, hematuria, rash, seizure activity, orthopnea, PND, pedal edema, claudication. Remaining systems are negative.  Physical Exam: Well-developed well-nourished in no acute distress.  Skin is warm and dry.  HEENT is normal.  Neck is supple.  Chest is clear to auscultation with normal expansion.  Cardiovascular exam is regular rate and rhythm.  Abdominal exam nontender or distended. No masses palpated. Extremities show no edema. neuro grossly intact  ECG normal sinus rhythm at a rate of 70. Left axis deviation. No ST changes. RV conduction delay.

## 2011-07-26 NOTE — Patient Instructions (Signed)
Your physician wants you to follow-up in: ONE YEAR WITH DR CRENSHAW You will receive a reminder letter in the mail two months in advance. If you don't receive a letter, please call our office to schedule the follow-up appointment.  

## 2011-07-26 NOTE — Assessment & Plan Note (Signed)
Blood pressure controlled. Continue present medications. Potassium and renal function monitored by primary care. 

## 2011-07-26 NOTE — Assessment & Plan Note (Signed)
euvolemic on exam. Continue present dose of Lasix.

## 2011-07-26 NOTE — Assessment & Plan Note (Signed)
Continue statin. Lipids and liver monitored by primary care. 

## 2011-07-26 NOTE — Assessment & Plan Note (Signed)
Continue aspirin and statin. Plan Myoview when he returns in one year. 

## 2011-08-14 ENCOUNTER — Other Ambulatory Visit: Payer: Self-pay | Admitting: Pulmonary Disease

## 2011-08-23 ENCOUNTER — Other Ambulatory Visit: Payer: Self-pay | Admitting: Pulmonary Disease

## 2011-08-30 ENCOUNTER — Other Ambulatory Visit: Payer: Self-pay | Admitting: Cardiology

## 2011-09-05 ENCOUNTER — Telehealth: Payer: Self-pay | Admitting: Pulmonary Disease

## 2011-09-05 MED ORDER — LISINOPRIL 10 MG PO TABS: 10.0000 mg | ORAL_TABLET | Freq: Every day | ORAL | Status: DC

## 2011-09-05 NOTE — Telephone Encounter (Signed)
Fred [name] is aware and nothing further was needed

## 2011-09-05 NOTE — Telephone Encounter (Signed)
rx has been signed by SN and placed up front for the pt to pick up.  thanks

## 2011-09-05 NOTE — Telephone Encounter (Signed)
RX has been printed off and placed on SN cart for signature

## 2011-09-19 ENCOUNTER — Other Ambulatory Visit: Payer: Self-pay | Admitting: Pulmonary Disease

## 2011-10-24 ENCOUNTER — Other Ambulatory Visit: Payer: Self-pay | Admitting: Cardiology

## 2011-11-10 ENCOUNTER — Encounter: Payer: Self-pay | Admitting: Pulmonary Disease

## 2011-11-10 ENCOUNTER — Ambulatory Visit (INDEPENDENT_AMBULATORY_CARE_PROVIDER_SITE_OTHER): Payer: Medicare Other | Admitting: Pulmonary Disease

## 2011-11-10 ENCOUNTER — Other Ambulatory Visit (INDEPENDENT_AMBULATORY_CARE_PROVIDER_SITE_OTHER): Payer: Medicare Other

## 2011-11-10 VITALS — BP 122/60 | HR 66 | Temp 96.3°F | Ht 63.0 in | Wt 189.4 lb

## 2011-11-10 DIAGNOSIS — M1990 Unspecified osteoarthritis, unspecified site: Secondary | ICD-10-CM

## 2011-11-10 DIAGNOSIS — I739 Peripheral vascular disease, unspecified: Secondary | ICD-10-CM

## 2011-11-10 DIAGNOSIS — I10 Essential (primary) hypertension: Secondary | ICD-10-CM | POA: Diagnosis not present

## 2011-11-10 DIAGNOSIS — E119 Type 2 diabetes mellitus without complications: Secondary | ICD-10-CM

## 2011-11-10 DIAGNOSIS — I259 Chronic ischemic heart disease, unspecified: Secondary | ICD-10-CM | POA: Diagnosis not present

## 2011-11-10 DIAGNOSIS — E1149 Type 2 diabetes mellitus with other diabetic neurological complication: Secondary | ICD-10-CM

## 2011-11-10 DIAGNOSIS — I6350 Cerebral infarction due to unspecified occlusion or stenosis of unspecified cerebral artery: Secondary | ICD-10-CM

## 2011-11-10 DIAGNOSIS — E785 Hyperlipidemia, unspecified: Secondary | ICD-10-CM | POA: Diagnosis not present

## 2011-11-10 DIAGNOSIS — G40209 Localization-related (focal) (partial) symptomatic epilepsy and epileptic syndromes with complex partial seizures, not intractable, without status epilepticus: Secondary | ICD-10-CM

## 2011-11-10 DIAGNOSIS — E1142 Type 2 diabetes mellitus with diabetic polyneuropathy: Secondary | ICD-10-CM

## 2011-11-10 DIAGNOSIS — I509 Heart failure, unspecified: Secondary | ICD-10-CM

## 2011-11-10 DIAGNOSIS — I4891 Unspecified atrial fibrillation: Secondary | ICD-10-CM | POA: Diagnosis not present

## 2011-11-10 LAB — LIPID PANEL
HDL: 37.3 mg/dL — ABNORMAL LOW (ref >39.00)
LDL Cholesterol: 38 mg/dL (ref 0–99)
Total CHOL/HDL Ratio: 3
VLDL: 31.6 mg/dL (ref 0.0–40.0)

## 2011-11-10 LAB — HEPATIC FUNCTION PANEL
ALT: 18 U/L (ref 0–53)
AST: 17 U/L (ref 0–37)
Albumin: 4.2 g/dL (ref 3.5–5.2)
Alkaline Phosphatase: 53 U/L (ref 39–117)
Bilirubin, Direct: 0.1 mg/dL (ref 0.0–0.3)
Total Bilirubin: 0.6 mg/dL (ref 0.3–1.2)
Total Protein: 7.6 g/dL (ref 6.0–8.3)

## 2011-11-10 LAB — BASIC METABOLIC PANEL
Calcium: 9.7 mg/dL (ref 8.4–10.5)
GFR: 61.62 mL/min (ref >60.00)
Sodium: 140 mEq/L (ref 135–145)

## 2011-11-10 LAB — HEMOGLOBIN A1C: Hgb A1c MFr Bld: 6.6 % — ABNORMAL HIGH (ref 4.6–6.5)

## 2011-11-10 MED ORDER — GLIMEPIRIDE 4 MG PO TABS: 4.0000 mg | ORAL_TABLET | Freq: Two times a day (BID) | ORAL | Status: DC

## 2011-11-10 NOTE — Patient Instructions (Addendum)
Today we updated your med list in our EPIC system...    Continue your current medications the same...    We refilled the meds you requested...  Today we did your follow up FASTING blood work...    We will call you w/ the results and see if you need to start on the long acting insulin as we discussed...  Keep up the good work on your diet & exercise program...    The goal is to lose 20+lbs...  Call for any questions...  Let's plan another follow up visit in 6 months.Marland KitchenMarland Kitchen

## 2011-11-10 NOTE — Progress Notes (Signed)
Subjective:    Patient ID: Fred [name], male    DOB: [birth year], 76 y.o.   MRN: [id number]  HPI 76 y/o WM here for a follow up visit...  he has multiple medical problems including HBP; CAD- s/p 4 vessel CABG in 2005; Hx PAF; Hyperlipidemia; DM on oral agents; Hx neuropathy; hx cerebrovasc dis & prev stroke w/ partial complex seizures; DJD, etc ...  he gets his meds from [facility]...   ~  May 10, 2010:  668mo ROV- doing well overall but hasn't lost any weight & he notes intermittent ?gout in left big toe (nothing active now & we discussed checking Uric w/ Rx as necessary);  he will use Aleve for knee & foot pain;  Uric=9.5 7 rec to start allopurinol 300mg /d...    BP controlled on meds; and denies CP, palpit, SOB, edema; he is too sedentary & we discussed incr exercise perscription for Cards rehab & wt reduction;  he denies cerebral ischemic symptoms or seizures...    Lipids are well regulated w/ his 3 meds + diet & exercise as noted> FLP shows TChol=115, TG=110, HDL=34, LDL=60> continue Simva40, [id number], Niaspan500...    Similarly DM has been well controlled on his 3 meds + diet & exercise (which clearly needs to be better)> Labs today shows BS=133, A1c=7.2 and he may need to incr meds; he denies neuropathy symptoms or pain;  U microalb=neg...  ~  November 08, 2010:  668mo ROV & he reports a good interval w/o new complaints or concerns...    BP controlled on meds & he denies CP, palpit, dizzy, edema, etc;  No angina or AFib episodes;  Chol remains stable on the combination meds w/ Simva40, Zetia10, Niaspan;  DM under reasonable control w/ BS reported to be in the 120-130 range & we followed up his labs today- SEE BELOW;  He notes that his arthritis/ Gout is under good control w/ Allopurinol...  ~  May 11, 2011:  668mo ROV & Fred [name] reports a stable 668mo w/o new complaints or concerns;  Cardiac stable on BBlocker, ACE, & Diuretic; he denies CP, palpit, ch in SOB, edema, etc;  Chol is fair on his  combination therapy w/ Simva40, Zetia10, & Niaspan500- see below, needs better diet & wt reduction;  But his DM control is poor on his regimen & we will up it to the max prior to starting insulin Rx (note> wt stable at 194#).Marland KitchenMarland Kitchen  See prob list below >> CXR 2/13 showed borderline heart size & prior CABG. Mild COPD, NAD... LABS 2/13:  FLP- at goals on Simva40 Zetia10 Niaspan500;  Chems- ok x BS=220 A1c=9.3 on ;  CBC- wnl;  TSH=1.32;  PSA=3.34;  Umicroalb= neg.  ~  November 10, 2011:  668mo ROV & Fred [name] states that he's doing well- no new complaints or concerns... He was exposed to poison oak doing his yard work & using topical cream w/ relief... He's not checking his BS regularly at home & he stopped his Januvia due to cost- still taking max Metform & Glimep, on diet & wt down 5#> all this has paid off as his BS=130 A1c=6.6 much improved, continue same!  BP remains well controlled on his BBlocker, ACE, Diuretic...    He saw DrCrenshaw 5/13> CAD, s/p CABG 2005, & felt to be stable, no changes made...    He saw DrDahlstedt 3/13> hx elev PSA, no other issues, & he remains stable... We reviewed prob list, meds, xrays and labs> see below>> LABS 8/13:  FLP- at foals x TG 158 on Simva40+Zetia10;  Chems- ok x BS=130 A1c=6.6.Marland KitchenMarland Kitchen           PROBLEM LIST:     BRONCHITIS, ACUTE WITH MILD BRONCHOSPASM (ICD-466.0) - he is an ex-smoker, quit in the 1970's... he denies cough, sputum, hemoptysis, worsening dyspnea, wheezing, chest pains, snoring, daytime hypersomnolence, etc...  ~  CXR 3/10 showed NAD- s/p med sternotomy, borderline hrt size, etc... ~  CXR 11/10 in hosp showed s/p CABG, low lung vols, basilar atx, NAD.Marland Kitchen. ~  CXR 2/13 showed borderline heart size & prior CABG. Mild COPD, NAD...  PAROTID LESION, UNSPECIFIED (ICD-239.0) - hx of bilat parotid hypertrophy without nodules... he states normal saliva, no dry mouth etc...  HYPERTENSION (ICD-401.9) - controlled on TOPROLXL 50mg /d, ZESTRIL 10mg /d, LASIX  40mg /d...  ~  8/12:  BP= 116/70 and doing well denies HA, fatigue, visual changes, CP, palipit, dizziness, syncope, dyspnea, edema, etc... still not checking BP's at home. ~  2/13:  BP= 138/72 & doing satis w/o CP, palpit, dizzy, ch in SOB, edema, etc... ~  8/13:  BP= 122/60 & he remains largely asymptomatic...  C A D - S/P C A B G (ICD-414.9) - he had 4 vessel CABG in 12/05... he has left pleural effusion post-CABG... followed by Aletta Edouard for cardiology on the above meds + ASA 325mg  daily... working on secondary risk factor reductions... min exercise w/ yard work and we discussed the need for daily ex indoors... DrCrenshaws notes reviewed>> Diastolic CHF treated w/ Lasix/ KCl. ~  Baseline EKG showed NSR, LAD, NAD... ~  Myoview 11/10 showed scar inferiorly w/ ischemia superimposed... mild HK inferiorly w/ EF=63%... ~  Cath 11/10 showed diffuse 50% proxLAD w/ 100% occlusion in mid portion (distal filled via left IM graft), mild plaque in CIRC w/ marginal branches occluded at the ostia, 100% prox RCA- PDA fills from collateral flow>> 2/4 grafts occluded... MED Rx. ~  EKG 5/13 showed NSR, rate70, LAD, IRBBB, NAD.Marland Kitchen. ~  5/13:  He had overdue f/u w/ DrCrenshaw> history of CAD s/p CABG 12/05. He had abn Myoview 11/10 w/ prior inferior infarct & moderate peri-infarct ischemia. EF=63%. Cath 11/10 showed EF=55% but his filling pressures were elevated with a LVEDP of 27 & 2 of 4 grafts were patent, started on diuretics at that time. He was felt to be stable w/o angina, same meds...  Hx of PAROXYSMAL ATRIAL FIBRILLATION (ICD-427.31) - no CP, palpit, dizzy, etc; appears to be maintaining NSR w/o arrhythmias...  PERIPHERAL VASCULAR DISEASE (ICD-443.9) - known small vessel dis on prev MRI in 2006 & concomitant MRA showed prominent intracranial atherosclerotic changes; on ASA daily.  HYPERLIPIDEMIA (ICD-272.4) - on SIMVASTATIN 40mg /d, ZETIA 10mg /d, NIASPAN 500mg /d & CoQ10...  ~  FLP 7/08 showed TChol 113, TG 105,  HDL 32, LDL 60 ~  FLP 3/09 showed TChol 132, TG 151, HDL 35, LDL 67 ~  FLP 8/09 showed TChol 95, TG 89, HDL 30, LDL 47... rec- continue same . ~  FLP 3/10 showed TChol 119, TG 84, HDL 37, LDL 65 ~  FLP 8/10 showed TChol 115, TG 92, HDL 37, LDL 59 ~  FLP 2/11 showed TChol 117, TG 101, HDL 43, LDL 54... same meds, low fat diet, get wt down, incr exercise! ~  FLP 8/11 showed TChol 116, TG 158, HDL 31, LDL 53 ~  FLP 2/12 showed TChol 115, TG 110, HDL 34, LDL 60 ~  FLP 8/12 showed TChol 118, TG 124, HDL 40, LDL 54...continue same , &  work on wt reduction... ~  FLP 2/13 showed TChol 143, TG 151, HDL 39, LDL 74 ~  FLP 8/13 showed TChol 107, TG 158, HDL 37, LDL 38  DIABETES MELLITUS (ICD-250.00) - on diet + METFORMIN 1000mg Bid, GLIMEPIRIDE 4mg Bid, & off Januvia due to cost... states home checks all 90-120... ~  labs 7/08 showed FBS=94and HgA1c= 6.2 ~  labs 3/09 showed BS= 141, HgA1c= 6.9 ~  labs 8/09 showed BS= 106, HgA1c= 6.3.Marland KitchenMarland Kitchen rec- continue the same, get weight down! ~  labs 3/10 showed BS= 83, A1c= 6.5 ~  labs 8/10 showed BS= 72, A1c= 6.1.Marland Kitchen. let's try to save some $$ by decr the Actos & Januvia to 1/2/d... ~  9/10: +Diabetic Retinopathy per Ophthalmology in [place]> reminded to f/u yeqarly. ~  labs 2/11 showed BS= 101, A1c= 6.1.Marland KitchenMarland Kitchen OK to stop the Actos. ~  labs 8/11 showed BS= 113-117 ~  Labs 2/12 showed BS= 133, A1c= 7.2 ~  Labs 8/12 showed BS= 167, A1c= 7.7.Marland KitchenMarland Kitchen  Rec> keep Metform1/2Bid but incr Glimep4mg /d & Januvia100mg /d... ~  Labs 2/13 showed BS= 220, A1c= 9.3.Marland KitchenMarland Kitchen Rec> maximize meds= Metform1000Bid, Glim4Bid, Januv100/d (he stopped [place]). ~  Labs 8/13 showed BS= 130, A1c= 6.6.Marland KitchenMarland Kitchen Continue diet & same 2 meds.  DIVERTICULOSIS (ICD-562.10) - flex sig 1997 by DrStark showed divertics, int hem... ~  3/10: referred back to GI for colonoscopy- done 06/04/08 & showed divertics, hem, otherw neg... f/u rec Prn.  UROLOGY> followed by DrDalhstedt & last seen 3/13> Hx elev PSA, & no other  urinary issues by report... ~  Labs 2009 showed PSA= 4.89 ~  Labs 2/13 showed PSA= 3.3 as reported by Urology & they rec that he follow up w/ [place] once per yr...  POLYNEUROPATHY (ICD-357.2) - diabetic vs other (he was in [place] w/ exposure there)...  Hx of STROKE (ICD-434.91) - hosp in 2006 w/ MRI showing sm vessel dis, old left thalamic infarct, & atherosclerotic changes on MRA as well... he continues on ASA 81mg /d...  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 no problems in recent yrs.  DEGENERATIVE JOINT DISEASE & GOUT>> on ALLOPURINOL 300mg /d;  he uses OTC meds> Tylenol, Aleve, etc...  Hx of ANEMIA / OTHER (ICD-285.9) ~  labs 2/11 showed Hg= 13.5 ~  Labs 2/12 showed Hg= 14.3 ~  Labs 2/13 showed Hg= 15.7   Past Surgical History  Procedure Date  . Coronary artery bypass graft   . Removal of sebaceous cyst from neck 08/2007    Dr. Luisa Hart    Outpatient Encounter Prescriptions as of 11/10/2011  Medication Sig Dispense Refill  . allopurinol (ZYLOPRIM) 300 MG tablet TAKE 1 TABLET BY MOUTH DAILY  90 tablet  4  . aspirin 81 MG tablet Take 81 mg by mouth daily.        . Coenzyme Q10 (COQ10) 30 MG CAPS Take 1 tablet by mouth daily.        Marland Kitchen ezetimibe (ZETIA) 10 MG tablet Take 1 tablet (10 mg total) by mouth daily.  90 tablet  3  . furosemide (LASIX) 40 MG tablet TAKE 1 TABLET BY MOUTH EVERY DAY  30 tablet  1  . glimepiride (AMARYL) 4 MG tablet Take 1 tablet (4 mg total) by mouth 2 (two) times daily with a meal.      . JANUVIA 100 MG tablet TAKE 1 TABLET BY MOUTH EVERY DAY  90 tablet  0  . lisinopril (PRINIVIL,ZESTRIL) 10 MG tablet Take 1 tablet (10  mg total) by mouth daily.  90 tablet  3  . metFORMIN (GLUCOPHAGE) 1000 MG tablet Take 1 tablet (1,000 mg total) by mouth 2 (two) times daily with a meal.      . metoprolol succinate (TOPROL-XL) 50 MG 24 hr tablet TAKE 1 TABLET BY MOUTH EVERY DAY  90 tablet  2  . niacin (NIASPAN) 500 MG CR  tablet Take 1 tablet (500 mg total) by mouth at bedtime.  90 tablet  4  . simvastatin (ZOCOR) 40 MG tablet Take 1 tablet (40 mg total) by mouth at bedtime.  90 tablet  3  . DISCONTD: glimepiride (AMARYL) 4 MG tablet TAKE 1 TABLET BY MOUTH EVERY DAY BEFORE BREAKFAST  30 tablet  3    No Known Allergies   Current Medications, Allergies, Past Medical History, Past Surgical History, Family History, and Social History were reviewed in [employer] record.    Review of Systems         See HPI - all other systems neg except as noted...  The patient complains of dyspnea on exertion and difficulty walking.  The patient denies anorexia, fever, weight loss, weight gain, vision loss, decreased hearing, hoarseness, chest pain, syncope, peripheral edema, prolonged cough, headaches, hemoptysis, abdominal pain, melena, hematochezia, severe indigestion/heartburn, hematuria, incontinence, muscle weakness, suspicious skin lesions, transient blindness, depression, unusual weight change, abnormal bleeding, enlarged lymph nodes, and angioedema.     Objective:   Physical Exam      WD, WN, 76 y/o WM in NAD... GENERAL:  Alert & oriented; pleasant & cooperative... HEENT:  [place]/AT, EOM-wnl, PERRLA, EACs-clear, TMs-wnl, NOSE-clear, THROAT-clear & wnl. NECK:  Supple w/ fairROM; no JVD; normal carotid impulses w/o bruits; no thyromegaly or nodules palpated; no lymphadenopathy. CHEST:  Clear to P & A... no rales or signs of consolidation... HEART:  Regular Rhythm; without murmurs/ rubs/ or gallops heard... ABDOMEN:  Obese, soft & nontender; normal bowel sounds; no organomegaly or masses detected. EXT: without deformities, mild arthritic changes; no varicose veins/ +venous insuffic/ no edema. NEURO:  CN's intact; no focal neuro deficits... DERM:  No lesions noted; no rash etc...  RADIOLOGY DATA:  Reviewed in the EPIC EMR & discussed w/ the patient...  LABORATORY DATA:  Reviewed in the EPIC EMR &  discussed w/ the patient...   Assessment & Plan:    DM Retinopathy>  Reminded to get yearly eye exams...  HBP>  Well controlled on 3 med regimen, continue same...  CAD, s/p CABG>  Stable w/o angina; he has purchased an exercise bike & setting it up now, continue same regimen...  Hx PAF>  Aware, he has been holding NSR w/o palpit, irregularity etc...  CHOL>  FLP looks great, continue same meds...  DM>  On Metform 1000mg Bid, Glimep 4mg Bid & Diet; A1c now 6.6 much improved...  GI> Divertics, Hems>  Stable & last colon 2010 was OK...  DJD/ GOUT>  Improved on the Allopurinol he says, uses OTC anal;gesics as needed...  Neuropathy>  DM vs other etiology...  Hx Stroke & Partial complex seizures>  On ASA 325mg /d w/o recurrent symptoms, continue same...   Patient's Medications  New Prescriptions   No medications on file  Previous Medications   ALLOPURINOL (ZYLOPRIM) 300 MG TABLET    TAKE 1 TABLET BY MOUTH DAILY   ASPIRIN 325 MG TABLET    Take 325 mg by mouth daily.   COENZYME Q10 (COQ10) 30 MG CAPS    Take 1 tablet by mouth daily.  EZETIMIBE (ZETIA) 10 MG TABLET    Take 1 tablet (10 mg total) by mouth daily.   FUROSEMIDE (LASIX) 40 MG TABLET    TAKE 1 TABLET BY MOUTH EVERY DAY   JANUVIA 100 MG TABLET    TAKE 1 TABLET BY MOUTH EVERY DAY   LISINOPRIL (PRINIVIL,ZESTRIL) 10 MG TABLET    Take 1 tablet (10 mg total) by mouth daily.   METFORMIN (GLUCOPHAGE) 1000 MG TABLET    Take 1 tablet (1,000 mg total) by mouth 2 (two) times daily with a meal.   METOPROLOL SUCCINATE (TOPROL-XL) 50 MG 24 HR TABLET    TAKE 1 TABLET BY MOUTH EVERY DAY   NIACIN (NIASPAN) 500 MG CR TABLET    Take 1 tablet (500 mg total) by mouth at bedtime.   SIMVASTATIN (ZOCOR) 40 MG TABLET    Take 1 tablet (40 mg total) by mouth at bedtime.  Modified Medications   Modified Medication Previous Medication   GLIMEPIRIDE (AMARYL) 4 MG TABLET glimepiride (AMARYL) 4 MG tablet      Take 1 tablet (4 mg total) by mouth 2 (two)  times daily with a meal.    Take 1 tablet (4 mg total) by mouth 2 (two) times daily with a meal.  Discontinued Medications   ASPIRIN 81 MG TABLET    Take 81 mg by mouth daily.     GLIMEPIRIDE (AMARYL) 4 MG TABLET    TAKE 1 TABLET BY MOUTH EVERY DAY BEFORE BREAKFAST

## 2011-12-06 ENCOUNTER — Telehealth: Payer: Self-pay | Admitting: Pulmonary Disease

## 2011-12-06 MED ORDER — EZETIMIBE 10 MG PO TABS: 10.0000 mg | ORAL_TABLET | Freq: Every day | ORAL | Status: DC

## 2011-12-06 NOTE — Telephone Encounter (Signed)
Called and spoke with patients wife.  Wife stating that they need a "hard copy" Rx for 90 days supply for Zetia to be sent to [facility].  Patients wife stated she will come to office and pick up once available.  Nothing further needed at this time.

## 2011-12-14 ENCOUNTER — Other Ambulatory Visit: Payer: Self-pay | Admitting: Pulmonary Disease

## 2011-12-21 ENCOUNTER — Other Ambulatory Visit: Payer: Self-pay | Admitting: Cardiology

## 2011-12-21 ENCOUNTER — Other Ambulatory Visit: Payer: Self-pay | Admitting: *Deleted

## 2011-12-21 MED ORDER — FUROSEMIDE 40 MG PO TABS: 40.0000 mg | ORAL_TABLET | Freq: Every day | ORAL | Status: DC

## 2011-12-21 NOTE — Telephone Encounter (Signed)
Refilled Furosemide. 

## 2011-12-22 ENCOUNTER — Other Ambulatory Visit: Payer: Self-pay | Admitting: Cardiology

## 2012-03-12 ENCOUNTER — Telehealth: Payer: Self-pay | Admitting: Pulmonary Disease

## 2012-03-12 MED ORDER — NIACIN ER (ANTIHYPERLIPIDEMIC) 500 MG PO TBCR: 500.0000 mg | EXTENDED_RELEASE_TABLET | Freq: Every day | ORAL | Status: DC

## 2012-03-12 NOTE — Telephone Encounter (Signed)
Rx was printed and placed on SN's cart to be signed Spouse wants to pick up when ready

## 2012-03-12 NOTE — Telephone Encounter (Signed)
Pt's wife notified that rx was ready for pick up.

## 2012-05-11 ENCOUNTER — Other Ambulatory Visit (INDEPENDENT_AMBULATORY_CARE_PROVIDER_SITE_OTHER): Payer: Medicare Other

## 2012-05-11 ENCOUNTER — Encounter: Payer: Self-pay | Admitting: Pulmonary Disease

## 2012-05-11 ENCOUNTER — Ambulatory Visit (INDEPENDENT_AMBULATORY_CARE_PROVIDER_SITE_OTHER): Payer: Medicare Other | Admitting: Pulmonary Disease

## 2012-05-11 VITALS — BP 138/80 | HR 69 | Temp 98.0°F | Ht 63.0 in | Wt 196.4 lb

## 2012-05-11 DIAGNOSIS — E785 Hyperlipidemia, unspecified: Secondary | ICD-10-CM

## 2012-05-11 DIAGNOSIS — Z0000 Encounter for general adult medical examination without abnormal findings: Secondary | ICD-10-CM | POA: Diagnosis not present

## 2012-05-11 DIAGNOSIS — N320 Bladder-neck obstruction: Secondary | ICD-10-CM

## 2012-05-11 DIAGNOSIS — G40209 Localization-related (focal) (partial) symptomatic epilepsy and epileptic syndromes with complex partial seizures, not intractable, without status epilepticus: Secondary | ICD-10-CM

## 2012-05-11 DIAGNOSIS — I10 Essential (primary) hypertension: Secondary | ICD-10-CM | POA: Diagnosis not present

## 2012-05-11 DIAGNOSIS — I739 Peripheral vascular disease, unspecified: Secondary | ICD-10-CM

## 2012-05-11 DIAGNOSIS — E119 Type 2 diabetes mellitus without complications: Secondary | ICD-10-CM | POA: Diagnosis not present

## 2012-05-11 DIAGNOSIS — K5730 Diverticulosis of large intestine without perforation or abscess without bleeding: Secondary | ICD-10-CM

## 2012-05-11 DIAGNOSIS — I509 Heart failure, unspecified: Secondary | ICD-10-CM | POA: Diagnosis not present

## 2012-05-11 DIAGNOSIS — M1990 Unspecified osteoarthritis, unspecified site: Secondary | ICD-10-CM

## 2012-05-11 DIAGNOSIS — I6350 Cerebral infarction due to unspecified occlusion or stenosis of unspecified cerebral artery: Secondary | ICD-10-CM

## 2012-05-11 DIAGNOSIS — I4891 Unspecified atrial fibrillation: Secondary | ICD-10-CM

## 2012-05-11 DIAGNOSIS — I259 Chronic ischemic heart disease, unspecified: Secondary | ICD-10-CM | POA: Diagnosis not present

## 2012-05-11 LAB — BASIC METABOLIC PANEL
Calcium: 9.6 mg/dL (ref 8.4–10.5)
Creatinine, Ser: 0.9 mg/dL (ref 0.4–1.5)
GFR: 84.17 mL/min (ref >60.00)
Sodium: 140 mEq/L (ref 135–145)

## 2012-05-11 LAB — LIPID PANEL
HDL: 34.9 mg/dL — ABNORMAL LOW (ref >39.00)
Total CHOL/HDL Ratio: 3
Triglycerides: 121 mg/dL (ref 0.0–149.0)
VLDL: 24.2 mg/dL (ref 0.0–40.0)

## 2012-05-11 LAB — CBC WITH DIFFERENTIAL/PLATELET
Basophils Absolute: 0 10*3/uL (ref 0.0–0.1)
Eosinophils Absolute: 0.2 10*3/uL (ref 0.0–0.7)
Hemoglobin: 14.6 g/dL (ref 13.0–17.0)
Lymphocytes Relative: 33.9 % (ref 12.0–46.0)
Lymphs Abs: 2.4 10*3/uL (ref 0.7–4.0)
MCHC: 33.6 g/dL (ref 30.0–36.0)
Neutro Abs: 3.9 10*3/uL (ref 1.4–7.7)
Platelets: 235 10*3/uL (ref 150.0–400.0)
RDW: 13.9 % (ref 11.5–14.6)

## 2012-05-11 LAB — HEPATIC FUNCTION PANEL
AST: 20 U/L (ref 0–37)
Alkaline Phosphatase: 60 U/L (ref 39–117)
Bilirubin, Direct: 0.2 mg/dL (ref 0.0–0.3)
Total Protein: 7 g/dL (ref 6.0–8.3)

## 2012-05-11 MED ORDER — SITAGLIPTIN PHOSPHATE 100 MG PO TABS: ORAL_TABLET | ORAL | Status: DC

## 2012-05-11 MED ORDER — INFLUENZA VIRUS VACCINE SPLIT IM INJ: 0.5000 mL | INJECTION | Freq: Once | INTRAMUSCULAR | Status: DC

## 2012-05-11 NOTE — Patient Instructions (Addendum)
Today we updated your med list in our EPIC system...    Continue your current medications the same...  Today we did your FASTING blood work...    We will contact you w/ results when avail...  Continue your diet & exercise priogram...  Call for any questions...  Let's plan a brief mid-year check up in 6 months.Marland KitchenMarland Kitchen

## 2012-05-11 NOTE — Progress Notes (Signed)
Subjective:    Patient ID: Fred [name], male    DOB: [birth year], 77 y.o.   MRN: [id number]  HPI 77 y/o WM here for a follow up visit...  he has multiple medical problems including HBP; CAD- s/p 4 vessel CABG in 2005; Hx PAF; Hyperlipidemia; DM on oral agents; Hx neuropathy; hx cerebrovasc dis & prev stroke w/ partial complex seizures; DJD, etc ...  he gets his meds from [facility]...   ~  May 11, 2011:  75mo ROV & Fred [name] reports a stable 75mo w/o new complaints or concerns;  Cardiac stable on BBlocker, ACE, & Diuretic; he denies CP, palpit, ch in SOB, edema, etc;  Chol is fair on his combination therapy w/ Simva40, Zetia10, & Niaspan500- see below, needs better diet & wt reduction;  But his DM control is poor on his regimen & we will up it to the max prior to starting insulin Rx (note> wt stable at 194#).Marland KitchenMarland Kitchen  See prob list below >> CXR 2/13 showed borderline heart size & prior CABG. Mild COPD, NAD... LABS 2/13:  FLP- at goals on Simva40 Zetia10 Niaspan500;  Chems- ok x BS=220 A1c=9.3 on ;  CBC- wnl;  TSH=1.32;  PSA=3.34;  Umicroalb= neg.  ~  November 10, 2011:  75mo ROV & Fred [name] states that he's doing well- no new complaints or concerns... He was exposed to poison oak doing his yard work & using topical cream w/ relief... He's not checking his BS regularly at home & he stopped his Januvia due to cost- still taking max Metform & Glimep, on diet & wt down 5#> all this has paid off as his BS=130 A1c=6.6 much improved, continue same!  BP remains well controlled on his BBlocker, ACE, Diuretic...    He saw DrCrenshaw 5/13> CAD, s/p CABG 2005, & felt to be stable, no changes made...    He saw DrDahlstedt 3/13> hx elev PSA, no other issues, & he remains stable... We reviewed prob list, meds, xrays and labs> see below>> LABS 8/13:  FLP- at foals x TG 158 on Simva40+Zetia10;  Chems- ok x BS=130 A1c=6.6.Marland Kitchen.   ~  May 11, 2012:  75mo ROV & Fred [name] reports a good interval w/o new complaints or  concerns;  We reviewed the following medical problems during today's office visit >>     Hx recurrent bronchitis> he remains asymptomatic w/o cough, sput, hemoptysis, SOB, CP, etc; last CXR 2/13 showed mild COPD, NAD, s/p CABG etc...    HBP> on MetopER50, Lisin10, Lasix40; BP= 138/80 & he denies CP, palpit, SOB, edema, etc...    CAD, s/p CABG, hx PAF> on [id number]; s/p 4vessel CABG 2005; Abn Myoview & re-cath in 2010 showed native vessel dis & 2/4 stents occluded- MED Rx; maintaining NSR w/o palpit...    Intracranial atherosclerotic dis & Hx stroke> on ASA325; MRI 2006 showed sm vessel dis, intracranial atherosclerotic dis on MRA, old left thalamic infarct; no neuro deficits and no cerebral ischemic symptoms...    Chol> on Simva40, Zetia10, Niaspan500; FLP shows TChol 117, TG 121, HDL 35, LDL 58- continue same & incr exerc...    DM> on Metform1000Bid, Glimep4 (1 or 2/d?), Januv100 (out x64mo$$); not really on diet, "eating too many church socials"; BS=117, A1c=7.2, Umicroalb=8.5H; Rec to restart Januv100mg /d...    Divertics> last colonoscopy was 2010 by DrStark- divertics, hems, no lesions seen & rec for f/u 66yrs...    DJD, gout> on Allopurinol300; he notes mild discomfort in knees esp going down stairs & we discussed  brace & Aleve prn... We reviewed prob list, meds, xrays and labs> see below for updates >> HE DID NOT BRING MED BOTTLES OR LIST TO THE OV TODAY! LABS 2/14:  FLP- at goals on x HDL=35;  Chems- wnl x BS=117, A1c=7.2;  CBC- wnl;  TSH=1.79;  PSA=3.30...          PROBLEM LIST:     BRONCHITIS, ACUTE WITH MILD BRONCHOSPASM (ICD-466.0) - he is an ex-smoker, quit in the 1970's... he denies cough, sputum, hemoptysis, worsening dyspnea, wheezing, chest pains, snoring, daytime hypersomnolence, etc...  ~  CXR 3/10 showed NAD- s/p med sternotomy, borderline hrt size, etc... ~  CXR 11/10 in hosp showed s/p CABG, low lung vols, basilar atx, NAD.Marland Kitchen. ~  CXR 2/13 showed borderline heart size & prior  CABG. Mild COPD, NAD...  PAROTID LESION, UNSPECIFIED (ICD-239.0) - hx of bilat parotid hypertrophy without nodules... he states normal saliva, no dry mouth etc...  HYPERTENSION (ICD-401.9) - controlled on TOPROLXL 50mg /d, ZESTRIL 10mg /d, LASIX 40mg /d...  ~  8/12:  BP= 116/70 and doing well denies HA, fatigue, visual changes, CP, palipit, dizziness, syncope, dyspnea, edema, etc... still not checking BP's at home. ~  2/13:  BP= 138/72 & doing satis w/o CP, palpit, dizzy, ch in SOB, edema, etc... ~  8/13:  BP= 122/60 & he remains largely asymptomatic... ~  2/14: on MetopER50, Lisin10, Lasix40; BP= 138/80 & he denies CP, palpit, SOB, edema, etc.   C A D - S/P C A B G (ICD-414.9) - he had 4 vessel CABG in 12/05... he has left pleural effusion post-CABG... followed by Aletta Edouard for cardiology on the above meds + ASA 325mg  daily... working on secondary risk factor reductions... min exercise w/ yard work and we discussed the need for daily ex indoors... DrCrenshaws notes reviewed>> Diastolic CHF treated w/ Lasix/ KCl. ~  Baseline EKG showed NSR, LAD, NAD... ~  Myoview 11/10 showed scar inferiorly w/ ischemia superimposed... mild HK inferiorly w/ EF=63%... ~  Cath 11/10 showed diffuse 50% proxLAD w/ 100% occlusion in mid portion (distal filled via left IM graft), mild plaque in CIRC w/ marginal branches occluded at the ostia, 100% prox RCA- PDA fills from collateral flow>> 2/4 grafts occluded... MED Rx. ~  EKG 5/13 showed NSR, rate70, LAD, IRBBB, NAD.Marland Kitchen. ~  5/13:  He had overdue f/u w/ DrCrenshaw> history of CAD s/p CABG 12/05. He had abn Myoview 11/10 w/ prior inferior infarct & moderate peri-infarct ischemia. EF=63%. Cath 11/10 showed EF=55% but his filling pressures were elevated with a LVEDP of 27 & 2 of 4 grafts were patent, started on diuretics at that time. He was felt to be stable w/o angina, same meds...  Hx of PAROXYSMAL ATRIAL FIBRILLATION (ICD-427.31) - no CP, palpit, dizzy, etc; appears to be  maintaining NSR w/o arrhythmias...  PERIPHERAL VASCULAR DISEASE (ICD-443.9) - known small vessel dis on prev MRI in 2006 & concomitant MRA showed prominent intracranial atherosclerotic changes; on ASA daily.  HYPERLIPIDEMIA (ICD-272.4) - on SIMVASTATIN 40mg /d, ZETIA 10mg /d, NIASPAN 500mg /d & CoQ10...  ~  FLP 7/08 showed TChol 113, TG 105, HDL 32, LDL 60 ~  FLP 3/09 showed TChol 132, TG 151, HDL 35, LDL 67 ~  FLP 8/09 showed TChol 95, TG 89, HDL 30, LDL 47... rec- continue same . ~  FLP 3/10 showed TChol 119, TG 84, HDL 37, LDL 65 ~  FLP 8/10 showed TChol 115, TG 92, HDL 37, LDL 59 ~  FLP 2/11 showed TChol 117, TG 101, HDL  43, LDL 54... same meds, low fat diet, get wt down, incr exercise! ~  FLP 8/11 showed TChol 116, TG 158, HDL 31, LDL 53 ~  FLP 2/12 showed TChol 115, TG 110, HDL 34, LDL 60 ~  FLP 8/12 showed TChol 118, TG 124, HDL 40, LDL 54...continue same , & work on wt reduction... ~  FLP 2/13 showed TChol 143, TG 151, HDL 39, LDL 74 ~  FLP 8/13 showed TChol 107, TG 158, HDL 37, LDL 38 ~  FLP 2/14 on Simva40, Zetia10, Niaspan500 showed TChol 117, TG 121, HDL 35, LDL 58- continue same & incr exerc...  DIABETES MELLITUS (ICD-250.00) - on diet + METFORMIN 1000mg Bid, GLIMEPIRIDE 4mg Bid, & off Januvia due to cost... states home checks all 90-120... ~  labs 7/08 showed FBS=94and HgA1c= 6.2 ~  labs 3/09 showed BS= 141, HgA1c= 6.9 ~  labs 8/09 showed BS= 106, HgA1c= 6.3.Marland KitchenMarland Kitchen rec- continue the same, get weight down! ~  labs 3/10 showed BS= 83, A1c= 6.5 ~  labs 8/10 showed BS= 72, A1c= 6.1.Marland Kitchen. let's try to save some $$ by decr the Actos & Januvia to 1/2/d... ~  9/10: +Diabetic Retinopathy per Ophthalmology in [place]> reminded to f/u yeqarly. ~  labs 2/11 showed BS= 101, A1c= 6.1.Marland KitchenMarland Kitchen OK to stop the Actos. ~  labs 8/11 showed BS= 113-117 ~  Labs 2/12 showed BS= 133, A1c= 7.2 ~  Labs 8/12 showed BS= 167, A1c= 7.7.Marland KitchenMarland Kitchen  Rec> keep Metform1/2Bid but incr Glimep4mg /d & Januvia100mg /d... ~  Labs  2/13 showed BS= 220, A1c= 9.3.Marland KitchenMarland Kitchen Rec> maximize meds= Metform1000Bid, Glim4Bid, Januv100/d (he stopped [place]). ~  Labs 8/13 showed BS= 130, A1c= 6.6.Marland KitchenMarland Kitchen Continue diet & same 2 meds. ~  Labs 2/14 on Metform1000Bid, Glimep4 (?1or2/d?), Januv100 (out x49mo$$); not really on diet, "eating too many church socials"; BS=117, A1c=7.2, Umicroalb=8.5H; Rec to restart Januv100mg /d.   DIVERTICULOSIS (ICD-562.10) - flex sig 1997 by DrStark showed divertics, int hem... ~  3/10: referred back to GI for colonoscopy- done 06/04/08 & showed divertics, hem, otherw neg... f/u rec Prn.  UROLOGY> followed by DrDalhstedt & last seen 3/13> Hx elev PSA, & no other urinary issues by report... ~  Labs 2009 showed PSA= 4.89 ~  Labs 2/13 showed PSA= 3.3 as reported by Urology & they rec that he follow up w/ [place] once per yr... ~  Labs 2/14 showed PSA= 3.30  POLYNEUROPATHY (ICD-357.2) - diabetic vs other (he was in [place] w/ exposure there)...  Hx of STROKE (ICD-434.91) - hosp in 2006 w/ MRI showing sm vessel dis, old left thalamic infarct, & atherosclerotic changes on MRA as well... he continues on ASA 81mg /d...  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 no problems in recent yrs.  DEGENERATIVE JOINT DISEASE & GOUT>> on ALLOPURINOL 300mg /d;  he uses OTC meds> Tylenol, Aleve, etc...  Hx of ANEMIA / OTHER (ICD-285.9) ~  labs 2/11 showed Hg= 13.5 ~  Labs 2/12 showed Hg= 14.3 ~  Labs 2/13 showed Hg= 15.7 ~  Labs 2/14 showed Hg= 14.6   Past Surgical History  Procedure Laterality Date  . Coronary artery bypass graft    . Removal of sebaceous cyst from neck  08/2007    Dr. Luisa Hart    Outpatient Encounter Prescriptions as of 05/11/2012  Medication Sig Dispense Refill  . allopurinol (ZYLOPRIM) 300 MG tablet TAKE 1 TABLET BY MOUTH DAILY  90 tablet  4  . aspirin 325 MG tablet Take 325 mg by mouth  daily.      . Coenzyme Q10 (COQ10) 30 MG CAPS Take 1 tablet by mouth daily.         Marland Kitchen ezetimibe (ZETIA) 10 MG tablet Take 1 tablet (10 mg total) by mouth daily.  90 tablet  3  . furosemide (LASIX) 40 MG tablet Take 1 tablet (40 mg total) by mouth daily.  30 tablet  5  . furosemide (LASIX) 40 MG tablet TAKE 1 TABLET BY MOUTH EVERY DAY  30 tablet  1  . glimepiride (AMARYL) 4 MG tablet Take 1 tablet (4 mg total) by mouth 2 (two) times daily with a meal.  60 tablet  6  . glimepiride (AMARYL) 4 MG tablet TAKE 1 TABLET BY MOUTH EVERY DAY BEFORE BREAKFAST  30 tablet  6  . JANUVIA 100 MG tablet TAKE 1 TABLET BY MOUTH EVERY DAY  90 tablet  0  . lisinopril (PRINIVIL,ZESTRIL) 10 MG tablet Take 1 tablet (10 mg total) by mouth daily.  90 tablet  3  . metFORMIN (GLUCOPHAGE) 1000 MG tablet Take 1 tablet (1,000 mg total) by mouth 2 (two) times daily with a meal.      . metoprolol succinate (TOPROL-XL) 50 MG 24 hr tablet TAKE 1 TABLET BY MOUTH EVERY DAY  90 tablet  2  . niacin (NIASPAN) 500 MG CR tablet Take 1 tablet (500 mg total) by mouth at bedtime.  90 tablet  4  . simvastatin (ZOCOR) 40 MG tablet Take 1 tablet (40 mg total) by mouth at bedtime.  90 tablet  3   Facility-Administered Encounter Medications as of 05/11/2012  Medication Dose Route Frequency Provider Last Rate Last Dose  . influenza virus trivalent vaccine (FLUZONE) injection 0.5 mL  0.5 mL Intramuscular Once Michele Mcalpine, MD        No Known Allergies   Current Medications, Allergies, Past Medical History, Past Surgical History, Family History, and Social History were reviewed in [employer] record.    Review of Systems         See HPI - all other systems neg except as noted...  The patient complains of dyspnea on exertion and difficulty walking.  The patient denies anorexia, fever, weight loss, weight gain, vision loss, decreased hearing, hoarseness, chest pain, syncope, peripheral edema, prolonged cough, headaches, hemoptysis, abdominal pain, melena, hematochezia, severe indigestion/heartburn,  hematuria, incontinence, muscle weakness, suspicious skin lesions, transient blindness, depression, unusual weight change, abnormal bleeding, enlarged lymph nodes, and angioedema.     Objective:   Physical Exam      WD, WN, 77 y/o WM in NAD... GENERAL:  Alert & oriented; pleasant & cooperative... HEENT:  [place]/AT, EOM-wnl, PERRLA, EACs-clear, TMs-wnl, NOSE-clear, THROAT-clear & wnl. NECK:  Supple w/ fairROM; no JVD; normal carotid impulses w/o bruits; no thyromegaly or nodules palpated; no lymphadenopathy. CHEST:  Clear to P & A... no rales or signs of consolidation... HEART:  Regular Rhythm; without murmurs/ rubs/ or gallops heard... ABDOMEN:  Obese, soft & nontender; normal bowel sounds; no organomegaly or masses detected. EXT: without deformities, mild arthritic changes; no varicose veins/ +venous insuffic/ no edema. NEURO:  CN's intact; no focal neuro deficits... DERM:  No lesions noted; no rash etc...  RADIOLOGY DATA:  Reviewed in the EPIC EMR & discussed w/ the patient...  LABORATORY DATA:  Reviewed in the EPIC EMR & discussed w/ the patient...   Assessment & Plan:    DM Retinopathy>  Reminded to get yearly eye exams...  HBP>  Well controlled on  3 med regimen, continue same...  CAD, s/p CABG>  Stable w/o angina; he has purchased an exercise bike but not using it; we discussed this, continue same meds...  Hx PAF>  Aware, he has been holding NSR w/o palpit, irregularity etc...  CHOL>  FLP looks great, continue same meds, incr exercise...  DM>  On Metform 1000mg Bid, Glimep 4mg  & he says out of Januvia for 80mo; not really on diet; A1c now 7.2 sl worse & reminded to bring all meds bottles to every office visit...  GI> Divertics, Hems>  Stable & last colon 2010 was OK...  DJD/ GOUT>  Improved on the Allopurinol he says, uses OTC anal;gesics as needed...  Neuropathy>  DM vs other etiology...  Hx Stroke & Partial complex seizures>  On ASA 325mg /d w/o recurrent symptoms, continue  same...   Patient's Medications  New Prescriptions   No medications on file  Previous Medications   ALLOPURINOL (ZYLOPRIM) 300 MG TABLET    TAKE 1 TABLET BY MOUTH DAILY   ASPIRIN 325 MG TABLET    Take 325 mg by mouth daily.   COENZYME Q10 (COQ10) 30 MG CAPS    Take 1 tablet by mouth daily.     EZETIMIBE (ZETIA) 10 MG TABLET    Take 1 tablet (10 mg total) by mouth daily.   FUROSEMIDE (LASIX) 40 MG TABLET    Take 1 tablet (40 mg total) by mouth daily.   FUROSEMIDE (LASIX) 40 MG TABLET    TAKE 1 TABLET BY MOUTH EVERY DAY   GLIMEPIRIDE (AMARYL) 4 MG TABLET    Take 1 tablet (4 mg total) by mouth 2 (two) times daily with a meal.   GLIMEPIRIDE (AMARYL) 4 MG TABLET    TAKE 1 TABLET BY MOUTH EVERY DAY BEFORE BREAKFAST   LISINOPRIL (PRINIVIL,ZESTRIL) 10 MG TABLET    Take 1 tablet (10 mg total) by mouth daily.   METFORMIN (GLUCOPHAGE) 1000 MG TABLET    Take 1 tablet (1,000 mg total) by mouth 2 (two) times daily with a meal.   METOPROLOL SUCCINATE (TOPROL-XL) 50 MG 24 HR TABLET    TAKE 1 TABLET BY MOUTH EVERY DAY   NIACIN (NIASPAN) 500 MG CR TABLET    Take 1 tablet (500 mg total) by mouth at bedtime.   SIMVASTATIN (ZOCOR) 40 MG TABLET    Take 1 tablet (40 mg total) by mouth at bedtime.  Modified Medications   Modified Medication Previous Medication   SITAGLIPTIN (JANUVIA) 100 MG TABLET JANUVIA 100 MG tablet      TAKE 1 TABLET BY MOUTH EVERY DAY    TAKE 1 TABLET BY MOUTH EVERY DAY  Discontinued Medications   No medications on file

## 2012-05-14 DIAGNOSIS — H2589 Other age-related cataract: Secondary | ICD-10-CM | POA: Diagnosis not present

## 2012-05-14 DIAGNOSIS — H43819 Vitreous degeneration, unspecified eye: Secondary | ICD-10-CM | POA: Diagnosis not present

## 2012-05-14 DIAGNOSIS — H35379 Puckering of macula, unspecified eye: Secondary | ICD-10-CM | POA: Diagnosis not present

## 2012-05-14 DIAGNOSIS — H526 Other disorders of refraction: Secondary | ICD-10-CM | POA: Diagnosis not present

## 2012-05-14 DIAGNOSIS — E11329 Type 2 diabetes mellitus with mild nonproliferative diabetic retinopathy without macular edema: Secondary | ICD-10-CM | POA: Diagnosis not present

## 2012-06-04 DIAGNOSIS — H2589 Other age-related cataract: Secondary | ICD-10-CM | POA: Diagnosis not present

## 2012-06-04 DIAGNOSIS — Z01818 Encounter for other preprocedural examination: Secondary | ICD-10-CM | POA: Diagnosis not present

## 2012-06-06 ENCOUNTER — Telehealth: Payer: Self-pay | Admitting: Pulmonary Disease

## 2012-06-06 MED ORDER — SIMVASTATIN 40 MG PO TABS: 40.0000 mg | ORAL_TABLET | Freq: Every day | ORAL | Status: DC

## 2012-06-06 MED ORDER — METFORMIN HCL 1000 MG PO TABS: 1000.0000 mg | ORAL_TABLET | Freq: Two times a day (BID) | ORAL | Status: DC

## 2012-06-06 NOTE — Telephone Encounter (Signed)
I spoke with spouse. Requesting to p/u rx's to take to [facility]. They will pick this up tomorrow. Nothing further was needed.

## 2012-06-13 ENCOUNTER — Other Ambulatory Visit: Payer: Self-pay | Admitting: Pulmonary Disease

## 2012-06-14 DIAGNOSIS — Z79899 Other long term (current) drug therapy: Secondary | ICD-10-CM | POA: Diagnosis not present

## 2012-06-14 DIAGNOSIS — I10 Essential (primary) hypertension: Secondary | ICD-10-CM | POA: Diagnosis not present

## 2012-06-14 DIAGNOSIS — H2589 Other age-related cataract: Secondary | ICD-10-CM | POA: Diagnosis not present

## 2012-06-14 DIAGNOSIS — I2510 Atherosclerotic heart disease of native coronary artery without angina pectoris: Secondary | ICD-10-CM | POA: Diagnosis not present

## 2012-06-14 DIAGNOSIS — H269 Unspecified cataract: Secondary | ICD-10-CM | POA: Diagnosis not present

## 2012-06-21 DIAGNOSIS — H35379 Puckering of macula, unspecified eye: Secondary | ICD-10-CM | POA: Diagnosis not present

## 2012-06-21 DIAGNOSIS — I2510 Atherosclerotic heart disease of native coronary artery without angina pectoris: Secondary | ICD-10-CM | POA: Diagnosis not present

## 2012-06-21 DIAGNOSIS — Z7982 Long term (current) use of aspirin: Secondary | ICD-10-CM | POA: Diagnosis not present

## 2012-06-21 DIAGNOSIS — Z79899 Other long term (current) drug therapy: Secondary | ICD-10-CM | POA: Diagnosis not present

## 2012-06-21 DIAGNOSIS — I10 Essential (primary) hypertension: Secondary | ICD-10-CM | POA: Diagnosis not present

## 2012-06-21 DIAGNOSIS — H269 Unspecified cataract: Secondary | ICD-10-CM | POA: Diagnosis not present

## 2012-06-21 DIAGNOSIS — Z87891 Personal history of nicotine dependence: Secondary | ICD-10-CM | POA: Diagnosis not present

## 2012-06-21 DIAGNOSIS — H2589 Other age-related cataract: Secondary | ICD-10-CM | POA: Diagnosis not present

## 2012-06-21 DIAGNOSIS — H2510 Age-related nuclear cataract, unspecified eye: Secondary | ICD-10-CM | POA: Diagnosis not present

## 2012-06-30 ENCOUNTER — Other Ambulatory Visit: Payer: Self-pay | Admitting: Pulmonary Disease

## 2012-07-14 ENCOUNTER — Other Ambulatory Visit: Payer: Self-pay | Admitting: Cardiology

## 2012-07-19 ENCOUNTER — Ambulatory Visit (INDEPENDENT_AMBULATORY_CARE_PROVIDER_SITE_OTHER): Payer: Medicare Other | Admitting: Cardiology

## 2012-07-19 ENCOUNTER — Encounter: Payer: Self-pay | Admitting: Cardiology

## 2012-07-19 VITALS — BP 138/76 | HR 67 | Wt 196.0 lb

## 2012-07-19 DIAGNOSIS — I509 Heart failure, unspecified: Secondary | ICD-10-CM | POA: Diagnosis not present

## 2012-07-19 DIAGNOSIS — E785 Hyperlipidemia, unspecified: Secondary | ICD-10-CM

## 2012-07-19 DIAGNOSIS — I259 Chronic ischemic heart disease, unspecified: Secondary | ICD-10-CM | POA: Diagnosis not present

## 2012-07-19 DIAGNOSIS — I10 Essential (primary) hypertension: Secondary | ICD-10-CM

## 2012-07-19 NOTE — Assessment & Plan Note (Addendum)
Continue aspirin and statin.schedule lexiscan myoview for risk stratification.

## 2012-07-19 NOTE — Progress Notes (Signed)
HPI: [name] is a pleasant gentleman who has a history of coronary artery disease status post coronary artery bypass graft in December 2005. Cardiac catheterization. on February 02, 2009 showed an ejection fraction of 55% but his filling pressures were elevated with a LVEDP of 27. 2 of 4 grafts were patent. He was started on diuretics at that time. I last saw him in May of 2013. Since then, the patient denies any dyspnea on exertion, orthopnea, PND, pedal edema, palpitations, syncope or chest pain. \  Current Outpatient Prescriptions  Medication Sig Dispense Refill  . allopurinol (ZYLOPRIM) 300 MG tablet TAKE 1 TABLET BY MOUTH DAILY  90 tablet  4  . aspirin 325 MG tablet Take 325 mg by mouth daily.      . Coenzyme Q10 (COQ10) 30 MG CAPS Take 1 tablet by mouth daily.        Marland Kitchen ezetimibe (ZETIA) 10 MG tablet Take 1 tablet (10 mg total) by mouth daily.  90 tablet  3  . furosemide (LASIX) 40 MG tablet TAKE 1 TABLET BY MOUTH EVERY DAY  30 tablet  1  . glimepiride (AMARYL) 4 MG tablet Take 1 tablet (4 mg total) by mouth 2 (two) times daily with a meal.  60 tablet  6  . lisinopril (PRINIVIL,ZESTRIL) 10 MG tablet Take 1 tablet (10 mg total) by mouth daily.  90 tablet  3  . metFORMIN (GLUCOPHAGE) 1000 MG tablet Take 1 tablet (1,000 mg total) by mouth 2 (two) times daily with a meal.  180 tablet  3  . metoprolol succinate (TOPROL-XL) 50 MG 24 hr tablet TAKE 1 TABLET BY MOUTH EVERY DAY  90 tablet  2  . niacin (NIASPAN) 500 MG CR tablet Take 1 tablet (500 mg total) by mouth at bedtime.  90 tablet  4  . simvastatin (ZOCOR) 40 MG tablet Take 1 tablet (40 mg total) by mouth at bedtime.  90 tablet  3  . sitaGLIPtin (JANUVIA) 100 MG tablet TAKE 1 TABLET BY MOUTH EVERY DAY  90 tablet  0   No current facility-administered medications for this visit.     Past Medical History  Diagnosis Date  . 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 Hypertension   . CAD (coronary artery disease)     s/p  cabg  . CHF (congestive heart failure)   . Peripheral vascular disease   . History of stroke   . Hyperlipidemia   . DM (diabetes mellitus)   . Diverticulosis   . Polyneuropathy, diabetic   . Seizure disorder, complex partial   . DJD (degenerative joint disease)   . History of anemia   . Atrial fibrillation     Post operative    Past Surgical History  Procedure Laterality Date  . Coronary artery bypass graft    . Removal of sebaceous cyst from neck  08/2007    Dr. Luisa Hart    History   Social History  . Marital Status: Married    Spouse Name: [name]    Number of Children: 3  . Years of Education: N/A   Occupational History  . Not on file.   Social History Main Topics  . Smoking status: Former Smoker    Quit date: 03/14/1977  . Smokeless tobacco: Never Used  . Alcohol Use: No  . Drug Use: No  . Sexually Active: Not on file   Other Topics Concern  . Not on file   Social History Narrative  .  No narrative on file    ROS: knee arthralgias but no fevers or chills, productive cough, hemoptysis, dysphasia, odynophagia, melena, hematochezia, dysuria, hematuria, rash, seizure activity, orthopnea, PND, pedal edema, claudication. Remaining systems are negative.  Physical Exam: Well-developed well-nourished in no acute distress.  Skin is warm and dry.  HEENT is normal.  Neck is supple.  Chest is clear to auscultation with normal expansion.  Cardiovascular exam is regular rate and rhythm.  Abdominal exam nontender or distended. No masses palpated. Extremities show no edema. neuro grossly intact  ECG sinus rhythm, left axis deviation.

## 2012-07-19 NOTE — Assessment & Plan Note (Signed)
History of diastolic CHF.euvolemic on examination. Continue present dose of Lasix. Potassium and renal function monitored by primary care.

## 2012-07-19 NOTE — Patient Instructions (Addendum)
Your physician wants you to follow-up in: ONE YEAR WITH DR CRENSHAW You will receive a reminder letter in the mail two months in advance. If you don't receive a letter, please call our office to schedule the follow-up appointment.   Your physician has requested that you have a lexiscan myoview. For further information please visit [email or web address]. Please follow instruction sheet, as given.   

## 2012-07-19 NOTE — Assessment & Plan Note (Signed)
Continue present blood pressure medications. 

## 2012-07-19 NOTE — Assessment & Plan Note (Signed)
Continue statin.lipids and liver monitored by primary care. 

## 2012-07-24 ENCOUNTER — Ambulatory Visit (HOSPITAL_COMMUNITY): Payer: Medicare Other | Attending: Cardiology | Admitting: Radiology

## 2012-07-24 VITALS — BP 147/74 | HR 64 | Ht 62.5 in | Wt 193.0 lb

## 2012-07-24 DIAGNOSIS — Z87891 Personal history of nicotine dependence: Secondary | ICD-10-CM | POA: Diagnosis not present

## 2012-07-24 DIAGNOSIS — R0602 Shortness of breath: Secondary | ICD-10-CM | POA: Diagnosis not present

## 2012-07-24 DIAGNOSIS — I2510 Atherosclerotic heart disease of native coronary artery without angina pectoris: Secondary | ICD-10-CM

## 2012-07-24 DIAGNOSIS — I10 Essential (primary) hypertension: Secondary | ICD-10-CM | POA: Diagnosis not present

## 2012-07-24 DIAGNOSIS — I509 Heart failure, unspecified: Secondary | ICD-10-CM

## 2012-07-24 DIAGNOSIS — I739 Peripheral vascular disease, unspecified: Secondary | ICD-10-CM | POA: Insufficient documentation

## 2012-07-24 DIAGNOSIS — E669 Obesity, unspecified: Secondary | ICD-10-CM | POA: Insufficient documentation

## 2012-07-24 DIAGNOSIS — E785 Hyperlipidemia, unspecified: Secondary | ICD-10-CM | POA: Diagnosis not present

## 2012-07-24 DIAGNOSIS — E119 Type 2 diabetes mellitus without complications: Secondary | ICD-10-CM | POA: Insufficient documentation

## 2012-07-24 DIAGNOSIS — Z8673 Personal history of transient ischemic attack (TIA), and cerebral infarction without residual deficits: Secondary | ICD-10-CM | POA: Diagnosis not present

## 2012-07-24 DIAGNOSIS — I259 Chronic ischemic heart disease, unspecified: Secondary | ICD-10-CM

## 2012-07-24 MED ORDER — TECHNETIUM TC 99M SESTAMIBI GENERIC - CARDIOLITE
33.0000 | Freq: Once | INTRAVENOUS | Status: AC | PRN
  Administered 2012-07-24: 33 via INTRAVENOUS

## 2012-07-24 MED ORDER — REGADENOSON 0.4 MG/5ML IV SOLN
0.4000 mg | Freq: Once | INTRAVENOUS | Status: AC
  Administered 2012-07-24: 0.4 mg via INTRAVENOUS

## 2012-07-24 MED ORDER — TECHNETIUM TC 99M SESTAMIBI GENERIC - CARDIOLITE
11.0000 | Freq: Once | INTRAVENOUS | Status: AC | PRN
  Administered 2012-07-24: 11 via INTRAVENOUS

## 2012-07-24 NOTE — Progress Notes (Signed)
[facility] SITE 3 NUCLEAR MED [address] [place], [place] [address] [phone]    Cardiology Nuclear Med Study  Fred [name] is a 77 y.o. male     MRN : [id number]     DOB: 1935/06/06  Procedure Date: 07/24/2012  Nuclear Med Background Indication for Stress Test:  Evaluation for Ischemia and Graft Patency History:  1/10 WUX:[id number] infarct with moderate peri infarct ischemia, EF=63%; 2/10 Cath:2/4 grafts patent, EF=55% Cardiac Risk Factors: CVA, History of Smoking, Hypertension, Lipids, NIDDM, Obesity and PVD  Symptoms:  SOB   Nuclear Pre-Procedure Caffeine/Decaff Intake:  None > 12 hrs NPO After: 7:00pm   Lungs:  Clear. O2 Sat: 96% on room air. IV 0.9% NS with Angio Cath:  20g  IV Site: R Antecubital x 1, tolerated well IV Started by:  Irean Hong, RN  Chest Size (in):  42 Cup Size: n/a  Height: 5' 2.5" (1.588 m)  Weight:  193 lb (87.544 kg)  BMI:  Body mass index is 34.72 kg/(m^2). Tech Comments:  Held Toprol x 24 hours; no Amaryl, Januvia, or metformin this am    Nuclear Med Study 1 or 2 day study: 1 day  Stress Test Type:  Treadmill/Lexiscan  Reading MD: Cassell Clement, MD  Order Authorizing Provider:  Olga Millers, MD  Resting Radionuclide: Technetium 74m Sestamibi  Resting Radionuclide Dose: 11.0 mCi   Stress Radionuclide:  Technetium 55m Sestamibi  Stress Radionuclide Dose: 33.0 mCi           Stress Protocol Rest HR: 64 Stress HR: 100  Rest BP: 147/74 Stress BP: 146/63  Exercise Time (min): 2:00 METS: n/a   Predicted Max HR: 145 bpm % Max HR: 68.97 bpm Rate Pressure Product: 72536   Dose of Adenosine (mg):  n/a Dose of Lexiscan: 0.4 mg  Dose of Atropine (mg): n/a Dose of Dobutamine: n/a mcg/kg/min (at max HR)  Stress Test Technologist: Smiley Houseman, CMA-N  Nuclear Technologist:  Domenic Polite, CNMT     Rest Procedure:  Myocardial perfusion imaging was performed at rest 45 minutes following the intravenous administration of  Technetium 23m Sestamibi.  Rest ECG: NSR - Normal EKG  Stress Procedure:  The patient received IV Lexiscan 0.4 mg over 15-seconds with concurrent low level exercise and then Technetium 52m Sestamibi was injected at 30-seconds while the patient continued walking one more minute.  Quantitative spect images were obtained after a 45-minute delay.  Stress ECG: No significant change from baseline ECG  QPS Raw Data Images:  Normal; no motion artifact; normal heart/lung ratio. Stress Images:  There is decreased uptake in the inferior wall. Rest Images:  There is decreased uptake in the inferior wall. Subtraction (SDS):  There is a fixed defect that is most consistent with a previous infarction. Transient Ischemic Dilatation (Normal <1.22):  1.09 Lung/Heart Ratio (Normal <0.45):  0.32  Quantitative Gated Spect Images QGS EDV:  82 ml QGS ESV:  33 ml  Impression Exercise Capacity:  Lexiscan with low level exercise. BP Response:  Normal blood pressure response. Clinical Symptoms:  No significant symptoms noted. ECG Impression:  No significant ST segment change suggestive of ischemia. Comparison with Prior Nuclear Study: No significant change from previous study  Overall Impression:  Low risk stress nuclear study Old small inferior wall scar involving apical inferior, midinferior and basal inferior segments , with mild peri-infarct ischemia..  LV Ejection Fraction: 60%.  LV Wall Motion:  NL LV Function; NL Wall Motion   [employer]

## 2012-09-10 ENCOUNTER — Telehealth: Payer: Self-pay | Admitting: Pulmonary Disease

## 2012-09-10 NOTE — Telephone Encounter (Signed)
lmomtcb x1 

## 2012-09-11 ENCOUNTER — Telehealth: Payer: Self-pay | Admitting: Pulmonary Disease

## 2012-09-11 MED ORDER — LISINOPRIL 10 MG PO TABS: 10.0000 mg | ORAL_TABLET | Freq: Every day | ORAL | Status: DC

## 2012-09-11 NOTE — Telephone Encounter (Signed)
Pt in lobby. Here to get rx

## 2012-09-11 NOTE — Telephone Encounter (Signed)
Last ov w/ SN 2.28.14, follow up in 6 months >> 8.29.14 Rx printed and signed and handed to patient in the lobby Nothing further needed by the patient Will sign off

## 2012-10-27 ENCOUNTER — Other Ambulatory Visit: Payer: Self-pay | Admitting: Pulmonary Disease

## 2012-10-30 ENCOUNTER — Other Ambulatory Visit: Payer: Self-pay | Admitting: Pulmonary Disease

## 2012-11-09 ENCOUNTER — Ambulatory Visit (INDEPENDENT_AMBULATORY_CARE_PROVIDER_SITE_OTHER): Payer: Medicare Other | Admitting: Pulmonary Disease

## 2012-11-09 ENCOUNTER — Other Ambulatory Visit (INDEPENDENT_AMBULATORY_CARE_PROVIDER_SITE_OTHER): Payer: Medicare Other

## 2012-11-09 ENCOUNTER — Encounter: Payer: Self-pay | Admitting: Pulmonary Disease

## 2012-11-09 VITALS — BP 130/74 | HR 62 | Temp 98.3°F | Ht 63.0 in | Wt 193.6 lb

## 2012-11-09 DIAGNOSIS — I10 Essential (primary) hypertension: Secondary | ICD-10-CM | POA: Diagnosis not present

## 2012-11-09 DIAGNOSIS — K5730 Diverticulosis of large intestine without perforation or abscess without bleeding: Secondary | ICD-10-CM

## 2012-11-09 DIAGNOSIS — E119 Type 2 diabetes mellitus without complications: Secondary | ICD-10-CM | POA: Diagnosis not present

## 2012-11-09 DIAGNOSIS — I4891 Unspecified atrial fibrillation: Secondary | ICD-10-CM

## 2012-11-09 DIAGNOSIS — I6350 Cerebral infarction due to unspecified occlusion or stenosis of unspecified cerebral artery: Secondary | ICD-10-CM

## 2012-11-09 DIAGNOSIS — I259 Chronic ischemic heart disease, unspecified: Secondary | ICD-10-CM

## 2012-11-09 DIAGNOSIS — I509 Heart failure, unspecified: Secondary | ICD-10-CM

## 2012-11-09 DIAGNOSIS — M109 Gout, unspecified: Secondary | ICD-10-CM

## 2012-11-09 DIAGNOSIS — E785 Hyperlipidemia, unspecified: Secondary | ICD-10-CM

## 2012-11-09 DIAGNOSIS — I739 Peripheral vascular disease, unspecified: Secondary | ICD-10-CM

## 2012-11-09 DIAGNOSIS — M1990 Unspecified osteoarthritis, unspecified site: Secondary | ICD-10-CM

## 2012-11-09 DIAGNOSIS — E1142 Type 2 diabetes mellitus with diabetic polyneuropathy: Secondary | ICD-10-CM

## 2012-11-09 LAB — BASIC METABOLIC PANEL
CO2: 27 mEq/L (ref 19–32)
Chloride: 105 mEq/L (ref 96–112)
GFR: 90.78 mL/min (ref >60.00)
Glucose, Bld: 159 mg/dL — ABNORMAL HIGH (ref 70–99)
Potassium: 4.7 mEq/L (ref 3.5–5.1)
Sodium: 138 mEq/L (ref 135–145)

## 2012-11-09 LAB — LIPID PANEL
Cholesterol: 116 mg/dL (ref 0–200)
VLDL: 24 mg/dL (ref 0.0–40.0)

## 2012-11-09 NOTE — Patient Instructions (Addendum)
Today we updated your med list in our EPIC system...    Continue your current medications the same...  Today we did your follow up FASTING blood work...    We will contact you w/ the results when available...   Keep up the good work w/ diet & exercise...    The goal is to lose 10-15 lbs!!!  Call for any questions...  Let's plan a follow up visit in 41mo, sooner if needed for problems.Marland KitchenMarland Kitchen

## 2012-11-09 NOTE — Progress Notes (Signed)
Subjective:    Patient ID: Fred [name], male    DOB: 02-27-1936, 77 y.o.   MRN: [id number]  HPI 77 y/o WM here for a follow up visit...  he has multiple medical problems including HBP; CAD- s/p 4 vessel CABG in 2005; Hx PAF; Hyperlipidemia; DM on oral agents; Hx neuropathy; hx cerebrovasc dis & prev stroke w/ partial complex seizures; DJD, etc ...  he gets his meds from [facility]...   ~  May 11, 2011:  87mo ROV & Fred [name] reports a stable 87mo w/o new complaints or concerns;  Cardiac stable on BBlocker, ACE, & Diuretic; he denies CP, palpit, ch in SOB, edema, etc;  Chol is fair on his combination therapy w/ Simva40, Zetia10, & Niaspan500- see below, needs better diet & wt reduction;  But his DM control is poor on his regimen & we will up it to the max prior to starting insulin Rx (note> wt stable at 194#).Marland KitchenMarland Kitchen  See prob list below >> CXR 2/13 showed borderline heart size & prior CABG. Mild COPD, NAD... LABS 2/13:  FLP- at goals on Simva40 Zetia10 Niaspan500;  Chems- ok x BS=220 A1c=9.3 on ;  CBC- wnl;  TSH=1.32;  PSA=3.34;  Umicroalb= neg.  ~  November 10, 2011:  87mo ROV & Fred [name] states that he's doing well- no new complaints or concerns... He was exposed to poison oak doing his yard work & using topical cream w/ relief... He's not checking his BS regularly at home & he stopped his Januvia due to cost- still taking max Metform & Glimep, on diet & wt down 5#> all this has paid off as his BS=130 A1c=6.6 much improved, continue same!  BP remains well controlled on his BBlocker, ACE, Diuretic...    He saw DrCrenshaw 5/13> CAD, s/p CABG 2005, & felt to be stable, no changes made...    He saw DrDahlstedt 3/13> hx elev PSA, no other issues, & he remains stable... We reviewed prob list, meds, xrays and labs> see below>> LABS 8/13:  FLP- at foals x TG 158 on Simva40+Zetia10;  Chems- ok x BS=130 A1c=6.6.Marland Kitchen.   ~  May 11, 2012:  87mo ROV & Fred [name] reports a good interval w/o new complaints or  concerns;  We reviewed the following medical problems during today's office visit >>     Hx recurrent bronchitis> he remains asymptomatic w/o cough, sput, hemoptysis, SOB, CP, etc; last CXR 2/13 showed mild COPD, NAD, s/p CABG etc...    HBP> on MetopER50, Lisin10, Lasix40; BP= 138/80 & he denies CP, palpit, SOB, edema, etc...    CAD, s/p CABG, hx PAF> on [id number]; s/p 4vessel CABG 2005; Abn Myoview & re-cath in 2010 showed native vessel dis & 2/4 stents occluded- MED Rx; maintaining NSR w/o palpit...    Intracranial atherosclerotic dis & Hx stroke> on ASA325; MRI 2006 showed sm vessel dis, intracranial atherosclerotic dis on MRA, old left thalamic infarct; no neuro deficits and no cerebral ischemic symptoms...    Chol> on Simva40, Zetia10, Niaspan500; FLP shows TChol 117, TG 121, HDL 35, LDL 58- continue same & incr exerc...    DM> on Metform1000Bid, Glimep4 (1 or 2/d?), Januv100 (out x62mo$$); not really on diet, "eating too many church socials"; BS=117, A1c=7.2, Umicroalb=8.5H; Rec to restart Januv100mg /d...    Divertics> last colonoscopy was 2010 by DrStark- divertics, hems, no lesions seen & rec for f/u 19yrs...    DJD, gout> on Allopurinol300; he notes mild discomfort in knees esp going down stairs & we discussed  brace & Aleve prn... We reviewed prob list, meds, xrays and labs> see below for updates >> HE DID NOT BRING MED BOTTLES OR LIST TO THE OV TODAY! LABS 2/14:  FLP- at goals on x HDL=35;  Chems- wnl x BS=117, A1c=7.2;  CBC- wnl;  TSH=1.79;  PSA=3.30...  ~  November 09, 2012:  32mo ROV & Fred [name] reports a good 32mo w/o complaints or concerns- he exercises doing yard work 7 recently chopping up a big tree;  We reviewed the following medical problems during today's office visit >>     Hx recurrent bronchitis> he remains asymptomatic w/o cough, sput, hemoptysis, SOB, CP, etc; last CXR 2/13 showed mild COPD, NAD, s/p CABG etc...    HBP> on MetopER50, Lisin10, Lasix40; BP= 130/74 & he denies CP,  palpit, SOB, edema, etc...    CAD, s/p CABG, hx PAF> on [id number]; s/p 4vessel CABG 2005; Abn Myoview & re-cath in 2010 showed native vessel dis & 2/4 stents occluded- MED Rx; repeat Myoview 5/14 was low risk w/ old infer wall scar & mild peri-infarct ischemia, norm wall motion & EF=60%, DrCrenshaw rec continued medical therapy...    Intracranial atherosclerotic dis & Hx stroke> on ASA325; MRI 2006 showed sm vessel dis, intracranial atherosclerotic dis on MRA, old left thalamic infarct; no neuro deficits and no cerebral ischemic symptoms...    Chol> on Simva40, Zetia10, Niaspan500; FLP 8/14 shows TChol 116, TG 120, HDL 39, LDL 53- continue same & incr exerc...    DM> on Metform1000Bid, Glimep4Bid, Januv100; not really on diet, "eating too many church socials"; BS=159, A1c=7.5; needs better diet & wt reduction...    Divertics> last colonoscopy was 2010 by DrStark- divertics, hems, no lesions seen & rec for f/u 53yrs...    DJD, gout> on Allopurinol300; he notes mild discomfort in knees esp going down stairs & we discussed brace & Aleve prn; Labs 8/14 showed Uric=4.0 We reviewed prob list, meds, xrays and labs> see below for updates >>  LABS 8/14:  FLP- at goals on Simva40, Zetia10, Niaspan500;  Chems- ok x BS=159 A1c=7.5 Uric=4.0.Marland KitchenMarland Kitchen           PROBLEM LIST:     BRONCHITIS, ACUTE WITH MILD BRONCHOSPASM (ICD-466.0) - he is an ex-smoker, quit in the 1970's... he denies cough, sputum, hemoptysis, worsening dyspnea, wheezing, chest pains, snoring, daytime hypersomnolence, etc...  ~  CXR 3/10 showed NAD- s/p med sternotomy, borderline hrt size, etc... ~  CXR 11/10 in hosp showed s/p CABG, low lung vols, basilar atx, NAD.Marland Kitchen. ~  CXR 2/13 showed borderline heart size & prior CABG. Mild COPD, NAD...  PAROTID LESION, UNSPECIFIED (ICD-239.0) - hx of bilat parotid hypertrophy without nodules... he states normal saliva, no dry mouth etc...  HYPERTENSION (ICD-401.9) - controlled on TOPROLXL 50mg /d, ZESTRIL 10mg /d,  LASIX 40mg /d...  ~  8/12:  BP= 116/70 and doing well denies HA, fatigue, visual changes, CP, palipit, dizziness, syncope, dyspnea, edema, etc... still not checking BP's at home. ~  2/13:  BP= 138/72 & doing satis w/o CP, palpit, dizzy, ch in SOB, edema, etc... ~  8/13:  BP= 122/60 & he remains largely asymptomatic... ~  2/14: on MetopER50, Lisin10, Lasix40; BP= 138/80 & he denies CP, palpit, SOB, edema, etc.   C A D - S/P C A B G (ICD-414.9) - he had 4 vessel CABG in 12/05... he has left pleural effusion post-CABG... followed by Aletta Edouard for cardiology on the above meds + ASA 325mg  daily... working on secondary risk factor reductions... min  exercise w/ yard work and we discussed the need for daily ex indoors... DrCrenshaws notes reviewed>> Diastolic CHF treated w/ Lasix/ KCl. ~  Baseline EKG showed NSR, LAD, NAD... ~  Myoview 11/10 showed scar inferiorly w/ ischemia superimposed... mild HK inferiorly w/ EF=63%... ~  Cath 11/10 showed diffuse 50% proxLAD w/ 100% occlusion in mid portion (distal filled via left IM graft), mild plaque in CIRC w/ marginal branches occluded at the ostia, 100% prox RCA- PDA fills from collateral flow>> 2/4 grafts occluded... MED Rx. ~  EKG 5/13 showed NSR, rate70, LAD, IRBBB, NAD.Marland Kitchen. ~  5/13:  He had overdue f/u w/ DrCrenshaw> history of CAD s/p CABG 12/05. He had abn Myoview 11/10 w/ prior inferior infarct & moderate peri-infarct ischemia. EF=63%. Cath 11/10 showed EF=55% but his filling pressures were elevated with a LVEDP of 27 & 2 of 4 grafts were patent, started on diuretics at that time. He was felt to be stable w/o angina, same meds... ~  EKG 5/14 showed NSR, rate67, LAD, otherw wnl/ NAD... ~  Myoview 5/14 showed low risk study w/ old infer wall scar & mild peri-infarct ischemia, norm wall motion & EF=60%, DrCrenshaw rec continued medical therapy...  Hx of PAROXYSMAL ATRIAL FIBRILLATION (ICD-427.31) - no CP, palpit, dizzy, etc; appears to be maintaining NSR w/o  arrhythmias...  PERIPHERAL VASCULAR DISEASE (ICD-443.9) - known small vessel dis on prev MRI in 2006 & concomitant MRA showed prominent intracranial atherosclerotic changes; on ASA daily.  HYPERLIPIDEMIA (ICD-272.4) - on SIMVASTATIN 40mg /d, ZETIA 10mg /d, NIASPAN 500mg /d & CoQ10...  ~  FLP 7/08 showed TChol 113, TG 105, HDL 32, LDL 60 ~  FLP 3/09 showed TChol 132, TG 151, HDL 35, LDL 67 ~  FLP 8/09 showed TChol 95, TG 89, HDL 30, LDL 47... rec- continue same . ~  FLP 3/10 showed TChol 119, TG 84, HDL 37, LDL 65 ~  FLP 8/10 showed TChol 115, TG 92, HDL 37, LDL 59 ~  FLP 2/11 showed TChol 117, TG 101, HDL 43, LDL 54... same meds, low fat diet, get wt down, incr exercise! ~  FLP 8/11 showed TChol 116, TG 158, HDL 31, LDL 53 ~  FLP 2/12 showed TChol 115, TG 110, HDL 34, LDL 60 ~  FLP 8/12 showed TChol 118, TG 124, HDL 40, LDL 54...continue same , & work on wt reduction... ~  FLP 2/13 showed TChol 143, TG 151, HDL 39, LDL 74 ~  FLP 8/13 showed TChol 107, TG 158, HDL 37, LDL 38 ~  FLP 2/14 on Simva40, Zetia10, Niaspan500 showed TChol 117, TG 121, HDL 35, LDL 58- continue same & incr exerc...  DIABETES MELLITUS (ICD-250.00) - on diet + METFORMIN 1000mg Bid, GLIMEPIRIDE 4mg Bid, & off Januvia due to cost... states home checks all 90-120... ~  labs 7/08 showed FBS=94and HgA1c= 6.2 ~  labs 3/09 showed BS= 141, HgA1c= 6.9 ~  labs 8/09 showed BS= 106, HgA1c= 6.3.Marland KitchenMarland Kitchen rec- continue the same, get weight down! ~  labs 3/10 showed BS= 83, A1c= 6.5 ~  labs 8/10 showed BS= 72, A1c= 6.1.Marland Kitchen. let's try to save some $$ by decr the Actos & Januvia to 1/2/d... ~  9/10: +Diabetic Retinopathy per Ophthalmology in [place]> reminded to f/u yeqarly. ~  labs 2/11 showed BS= 101, A1c= 6.1.Marland KitchenMarland Kitchen OK to stop the Actos. ~  labs 8/11 showed BS= 113-117 ~  Labs 2/12 showed BS= 133, A1c= 7.2 ~  Labs 8/12 showed BS= 167, A1c= 7.7.Marland KitchenMarland Kitchen  Rec> keep Metform1/2Bid but incr Glimep4mg /d &  Januvia100mg /d... ~  Labs 2/13 showed BS=  220, A1c= 9.3.Marland KitchenMarland Kitchen Rec> maximize meds= Metform1000Bid, Glim4Bid, Januv100/d (he stopped [place]). ~  Labs 8/13 showed BS= 130, A1c= 6.6.Marland KitchenMarland Kitchen Continue diet & same 2 meds. ~  Labs 2/14 on Metform1000Bid, Glimep4 (?1or2/d?), Januv100 (out x49mo$$); not really on diet, "eating too many church socials"; BS=117, A1c=7.2, Umicroalb=8.5H; Rec to restart Januv100mg /d.  ~  3/14:  He had eye exam from DrHageman in [place]> mild non-prolif DM retinopathy & cats... ~  Labs 8/14 on Metform1000Bid, Glim4Bid, Januv100 showed BS=159, A1c=7.5; needs better diet & wt reduction...  DIVERTICULOSIS (ICD-562.10) - flex sig 1997 by DrStark showed divertics, int hem... ~  3/10: referred back to GI for colonoscopy- done 06/04/08 & showed divertics, hem, otherw neg... f/u rec Prn.  UROLOGY> followed by DrDalhstedt & last seen 3/13> Hx elev PSA, & no other urinary issues by report... ~  Labs 2009 showed PSA= 4.89 ~  Labs 2/13 showed PSA= 3.3 as reported by Urology & they rec that he follow up w/ [place] once per yr... ~  Labs 2/14 showed PSA= 3.30  POLYNEUROPATHY (ICD-357.2) - diabetic vs other (he was in [place] w/ exposure there)...  Hx of STROKE (ICD-434.91) - hosp in 2006 w/ MRI showing sm vessel dis, old left thalamic infarct, & atherosclerotic changes on MRA as well... he continues on ASA 81mg /d...  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 no problems in recent yrs.  DEGENERATIVE JOINT DISEASE & GOUT>> on ALLOPURINOL 300mg /d;  he uses OTC meds> Tylenol, Aleve, etc...  Hx of ANEMIA / OTHER (ICD-285.9) ~  labs 2/11 showed Hg= 13.5 ~  Labs 2/12 showed Hg= 14.3 ~  Labs 2/13 showed Hg= 15.7 ~  Labs 2/14 showed Hg= 14.6   Past Surgical History  Procedure Laterality Date  . Coronary artery bypass graft    . Removal of sebaceous cyst from neck  08/2007    Dr. Luisa Hart    Outpatient Encounter Prescriptions as of 11/09/2012  Medication Sig Dispense Refill  . allopurinol  (ZYLOPRIM) 300 MG tablet TAKE 1 TABLET BY MOUTH DAILY  90 tablet  4  . aspirin 325 MG tablet Take 325 mg by mouth daily.      . Coenzyme Q10 (COQ10) 30 MG CAPS Take 1 tablet by mouth daily.        Marland Kitchen ezetimibe (ZETIA) 10 MG tablet Take 1 tablet (10 mg total) by mouth daily.  90 tablet  3  . furosemide (LASIX) 40 MG tablet TAKE 1 TABLET BY MOUTH EVERY DAY  30 tablet  1  . glimepiride (AMARYL) 4 MG tablet Take 1 tablet (4 mg total) by mouth 2 (two) times daily with a meal.  60 tablet  6  . lisinopril (PRINIVIL,ZESTRIL) 10 MG tablet Take 1 tablet (10 mg total) by mouth daily.  90 tablet  3  . metFORMIN (GLUCOPHAGE) 1000 MG tablet Take 1 tablet (1,000 mg total) by mouth 2 (two) times daily with a meal.  180 tablet  3  . metoprolol succinate (TOPROL-XL) 50 MG 24 hr tablet TAKE 1 TABLET BY MOUTH EVERY DAY  90 tablet  2  . niacin (NIASPAN) 500 MG CR tablet Take 1 tablet (500 mg total) by mouth at bedtime.  90 tablet  4  . simvastatin (ZOCOR) 40 MG tablet Take 1 tablet (40 mg total) by mouth at bedtime.  90 tablet  3  . sitaGLIPtin (JANUVIA) 100 MG tablet TAKE 1 TABLET BY MOUTH  EVERY DAY  90 tablet  0  . [DISCONTINUED] glimepiride (AMARYL) 4 MG tablet TAKE 1 TABLET BY MOUTH DAILY BEFORE BREAKFAST  30 tablet  6   No facility-administered encounter medications on file as of 11/09/2012.    No Known Allergies   Current Medications, Allergies, Past Medical History, Past Surgical History, Family History, and Social History were reviewed in [employer] record.    Review of Systems         See HPI - all other systems neg except as noted...  The patient complains of dyspnea on exertion and difficulty walking.  The patient denies anorexia, fever, weight loss, weight gain, vision loss, decreased hearing, hoarseness, chest pain, syncope, peripheral edema, prolonged cough, headaches, hemoptysis, abdominal pain, melena, hematochezia, severe indigestion/heartburn, hematuria, incontinence,  muscle weakness, suspicious skin lesions, transient blindness, depression, unusual weight change, abnormal bleeding, enlarged lymph nodes, and angioedema.     Objective:   Physical Exam      WD, WN, 77 y/o WM in NAD... GENERAL:  Alert & oriented; pleasant & cooperative... HEENT:  [place]/AT, EOM-wnl, PERRLA, EACs-clear, TMs-wnl, NOSE-clear, THROAT-clear & wnl. NECK:  Supple w/ fairROM; no JVD; normal carotid impulses w/o bruits; no thyromegaly or nodules palpated; no lymphadenopathy. CHEST:  Clear to P & A... no rales or signs of consolidation... HEART:  Regular Rhythm; without murmurs/ rubs/ or gallops heard... ABDOMEN:  Obese, soft & nontender; normal bowel sounds; no organomegaly or masses detected. EXT: without deformities, mild arthritic changes; no varicose veins/ +venous insuffic/ no edema. NEURO:  CN's intact; no focal neuro deficits... DERM:  No lesions noted; no rash etc...  RADIOLOGY DATA:  Reviewed in the EPIC EMR & discussed w/ the patient...  LABORATORY DATA:  Reviewed in the EPIC EMR & discussed w/ the patient...   Assessment & Plan:    DM Retinopathy>  Reminded to get yearly eye exams...  HBP>  Well controlled on 3 med regimen, continue same...  CAD, s/p CABG>  Stable w/o angina; he has purchased an exercise bike but not using it; we discussed this, continue same meds...  Hx PAF>  Aware, he has been holding NSR w/o palpit, irregularity etc...  CHOL>  FLP looks great, continue same meds, incr exercise...  DM>  On Metform 1000mg Bid, Glimep 4mg Bid & Januvia100-  not really on diet; A1c now 7.5 sl worse & reminded to bring all meds bottles to every office visit...  GI> Divertics, Hems>  Stable & last colon 2010 was OK...  DJD/ GOUT>  Improved on the Allopurinol he says, uses OTC anal;gesics as needed...  Neuropathy>  DM vs other etiology...  Hx Stroke & Partial complex seizures>  On ASA 325mg /d w/o recurrent symptoms, continue same...   Patient's Medications  New  Prescriptions   No medications on file  Previous Medications   ALLOPURINOL (ZYLOPRIM) 300 MG TABLET    TAKE 1 TABLET BY MOUTH DAILY   ASPIRIN 325 MG TABLET    Take 325 mg by mouth daily.   COENZYME Q10 (COQ10) 30 MG CAPS    Take 1 tablet by mouth daily.     EZETIMIBE (ZETIA) 10 MG TABLET    Take 1 tablet (10 mg total) by mouth daily.   FUROSEMIDE (LASIX) 40 MG TABLET    TAKE 1 TABLET BY MOUTH EVERY DAY   GLIMEPIRIDE (AMARYL) 4 MG TABLET    Take 1 tablet (4 mg total) by mouth 2 (two) times daily with a meal.   LISINOPRIL (PRINIVIL,ZESTRIL) 10 MG  TABLET    Take 1 tablet (10 mg total) by mouth daily.   METFORMIN (GLUCOPHAGE) 1000 MG TABLET    Take 1 tablet (1,000 mg total) by mouth 2 (two) times daily with a meal.   METOPROLOL SUCCINATE (TOPROL-XL) 50 MG 24 HR TABLET    TAKE 1 TABLET BY MOUTH EVERY DAY   NIACIN (NIASPAN) 500 MG CR TABLET    Take 1 tablet (500 mg total) by mouth at bedtime.   SIMVASTATIN (ZOCOR) 40 MG TABLET    Take 1 tablet (40 mg total) by mouth at bedtime.   SITAGLIPTIN (JANUVIA) 100 MG TABLET    TAKE 1 TABLET BY MOUTH EVERY DAY  Modified Medications   No medications on file  Discontinued Medications   GLIMEPIRIDE (AMARYL) 4 MG TABLET    TAKE 1 TABLET BY MOUTH DAILY BEFORE BREAKFAST

## 2012-12-11 ENCOUNTER — Telehealth: Payer: Self-pay | Admitting: Pulmonary Disease

## 2012-12-11 MED ORDER — EZETIMIBE 10 MG PO TABS: 10.0000 mg | ORAL_TABLET | Freq: Every day | ORAL | Status: DC

## 2012-12-11 NOTE — Telephone Encounter (Signed)
rx for the zetia has been printed and placed on SN cart to be signed.   Will call family when this is ready to  Be picked up.

## 2012-12-11 NOTE — Telephone Encounter (Signed)
Called and spoke with pts wife and she is aware of rx that has been left up front and she will stop by tomorrow to pick this up.

## 2013-02-22 ENCOUNTER — Telehealth: Payer: Self-pay | Admitting: Pulmonary Disease

## 2013-02-22 NOTE — Telephone Encounter (Signed)
Called spoke with patient's spouse who reported that their home has 14 steps and it is becoming increasingly difficult for pt to traverse these d/t knee discomfort.  Spouse had contacted a company about a stairlift and was advised that if a rx is written, then this will cut down on cost.  Spouse is requesting a rx for an "[employer] Stair lift" to be faxed to [phone]. Last ov was 8.29.14, follow up in 6 months >> none pending Advised spouse that SN is out of the office this afternoon but will return Monday 12.15.14 - she is okay with a call back then  Dr Kriste Basque please advise, thank you.

## 2013-02-25 NOTE — Telephone Encounter (Signed)
Called and spoke with pts wife and she stated that the company if [employer] stair lift.  The fax number is [phone].  She is aware that the rx for the lift will be faxed today.  Nothing further is needed.

## 2013-05-16 ENCOUNTER — Encounter: Payer: Self-pay | Admitting: Pulmonary Disease

## 2013-05-16 ENCOUNTER — Other Ambulatory Visit (INDEPENDENT_AMBULATORY_CARE_PROVIDER_SITE_OTHER): Payer: Medicare Other

## 2013-05-16 ENCOUNTER — Ambulatory Visit (INDEPENDENT_AMBULATORY_CARE_PROVIDER_SITE_OTHER)
Admission: RE | Admit: 2013-05-16 | Discharge: 2013-05-16 | Disposition: A | Discharge status: Home / Self Care | Payer: Medicare Other | Source: Ambulatory Visit | Attending: Pulmonary Disease | Admitting: Pulmonary Disease

## 2013-05-16 ENCOUNTER — Ambulatory Visit (INDEPENDENT_AMBULATORY_CARE_PROVIDER_SITE_OTHER): Payer: Medicare Other | Admitting: Pulmonary Disease

## 2013-05-16 VITALS — BP 142/64 | HR 66 | Temp 97.4°F | Ht 63.0 in | Wt 193.2 lb

## 2013-05-16 DIAGNOSIS — K5730 Diverticulosis of large intestine without perforation or abscess without bleeding: Secondary | ICD-10-CM | POA: Diagnosis not present

## 2013-05-16 DIAGNOSIS — M1990 Unspecified osteoarthritis, unspecified site: Secondary | ICD-10-CM

## 2013-05-16 DIAGNOSIS — I10 Essential (primary) hypertension: Secondary | ICD-10-CM

## 2013-05-16 DIAGNOSIS — N320 Bladder-neck obstruction: Secondary | ICD-10-CM | POA: Diagnosis not present

## 2013-05-16 DIAGNOSIS — E1142 Type 2 diabetes mellitus with diabetic polyneuropathy: Secondary | ICD-10-CM

## 2013-05-16 DIAGNOSIS — I259 Chronic ischemic heart disease, unspecified: Secondary | ICD-10-CM

## 2013-05-16 DIAGNOSIS — E785 Hyperlipidemia, unspecified: Secondary | ICD-10-CM

## 2013-05-16 DIAGNOSIS — Z8739 Personal history of other diseases of the musculoskeletal system and connective tissue: Secondary | ICD-10-CM | POA: Insufficient documentation

## 2013-05-16 DIAGNOSIS — E119 Type 2 diabetes mellitus without complications: Secondary | ICD-10-CM

## 2013-05-16 DIAGNOSIS — Z8639 Personal history of other endocrine, nutritional and metabolic disease: Secondary | ICD-10-CM

## 2013-05-16 DIAGNOSIS — I6350 Cerebral infarction due to unspecified occlusion or stenosis of unspecified cerebral artery: Secondary | ICD-10-CM

## 2013-05-16 DIAGNOSIS — I509 Heart failure, unspecified: Secondary | ICD-10-CM

## 2013-05-16 DIAGNOSIS — Z23 Encounter for immunization: Secondary | ICD-10-CM | POA: Diagnosis not present

## 2013-05-16 DIAGNOSIS — I739 Peripheral vascular disease, unspecified: Secondary | ICD-10-CM

## 2013-05-16 DIAGNOSIS — I4891 Unspecified atrial fibrillation: Secondary | ICD-10-CM

## 2013-05-16 DIAGNOSIS — Z0000 Encounter for general adult medical examination without abnormal findings: Secondary | ICD-10-CM | POA: Diagnosis not present

## 2013-05-16 DIAGNOSIS — Z862 Personal history of diseases of the blood and blood-forming organs and certain disorders involving the immune mechanism: Secondary | ICD-10-CM

## 2013-05-16 LAB — URINALYSIS, ROUTINE W REFLEX MICROSCOPIC
BILIRUBIN URINE: NEGATIVE
HGB URINE DIPSTICK: NEGATIVE
Leukocytes, UA: NEGATIVE
Nitrite: NEGATIVE
RBC / HPF: NONE SEEN (ref 0–?)
Specific Gravity, Urine: >=1.03 — AB (ref 1.000–1.030)
Total Protein, Urine: 30 — AB
URINE GLUCOSE: NEGATIVE
UROBILINOGEN UA: 0.2 (ref 0.0–1.0)
pH: 5.5 (ref 5.0–8.0)

## 2013-05-16 LAB — LIPID PANEL
Cholesterol: 121 mg/dL (ref 0–200)
HDL: 36.1 mg/dL — ABNORMAL LOW (ref >39.00)
LDL Cholesterol: 59 mg/dL (ref 0–99)
Total CHOL/HDL Ratio: 3
Triglycerides: 129 mg/dL (ref 0.0–149.0)
VLDL: 25.8 mg/dL (ref 0.0–40.0)

## 2013-05-16 LAB — CBC WITH DIFFERENTIAL/PLATELET
Basophils Absolute: 0.1 10*3/uL (ref 0.0–0.1)
Basophils Relative: 0.7 % (ref 0.0–3.0)
EOS ABS: 0.3 10*3/uL (ref 0.0–0.7)
Eosinophils Relative: 3.3 % (ref 0.0–5.0)
HEMATOCRIT: 44.7 % (ref 39.0–52.0)
HEMOGLOBIN: 14.9 g/dL (ref 13.0–17.0)
LYMPHS ABS: 2.4 10*3/uL (ref 0.7–4.0)
Lymphocytes Relative: 31.3 % (ref 12.0–46.0)
MCHC: 33.4 g/dL (ref 30.0–36.0)
MCV: 95.3 fl (ref 78.0–100.0)
MONO ABS: 0.5 10*3/uL (ref 0.1–1.0)
Monocytes Relative: 7.1 % (ref 3.0–12.0)
NEUTROS ABS: 4.5 10*3/uL (ref 1.4–7.7)
Neutrophils Relative %: 57.6 % (ref 43.0–77.0)
Platelets: 238 10*3/uL (ref 150.0–400.0)
RBC: 4.69 Mil/uL (ref 4.22–5.81)
RDW: 13.6 % (ref 11.5–14.6)
WBC: 7.8 10*3/uL (ref 4.5–10.5)

## 2013-05-16 LAB — HEPATIC FUNCTION PANEL
ALK PHOS: 50 U/L (ref 39–117)
ALT: 21 U/L (ref 0–53)
AST: 20 U/L (ref 0–37)
Albumin: 4.1 g/dL (ref 3.5–5.2)
Bilirubin, Direct: 0.1 mg/dL (ref 0.0–0.3)
TOTAL PROTEIN: 7.1 g/dL (ref 6.0–8.3)
Total Bilirubin: 0.7 mg/dL (ref 0.3–1.2)

## 2013-05-16 LAB — BASIC METABOLIC PANEL
BUN: 14 mg/dL (ref 6–23)
CO2: 28 meq/L (ref 19–32)
Calcium: 9.7 mg/dL (ref 8.4–10.5)
Chloride: 106 mEq/L (ref 96–112)
Creatinine, Ser: 0.9 mg/dL (ref 0.4–1.5)
GFR: 84.99 mL/min (ref >60.00)
GLUCOSE: 154 mg/dL — AB (ref 70–99)
POTASSIUM: 4.4 meq/L (ref 3.5–5.1)
SODIUM: 139 meq/L (ref 135–145)

## 2013-05-16 LAB — HEMOGLOBIN A1C: HEMOGLOBIN A1C: 7.3 % — AB (ref 4.6–6.5)

## 2013-05-16 LAB — PSA: PSA: 4.29 ng/mL — AB (ref 0.10–4.00)

## 2013-05-16 LAB — TSH: TSH: 1.39 u[IU]/mL (ref 0.35–5.50)

## 2013-05-16 MED ORDER — SITAGLIPTIN PHOSPHATE 100 MG PO TABS: ORAL_TABLET | ORAL | Status: DC

## 2013-05-16 NOTE — Patient Instructions (Addendum)
Today we updated your med list in our EPIC system...    Continue your current medications the same...  We refilled your JANUVIA 100mg  one tab daily...  Today we did your follow up CXR & FASTING blood work...    We will contact you w/ the results when available...   Let's get on track w/ our diet 7 exercise program...    The goal is to lose 10-15 lbs...  Look up "the glycemic index" on the internet & AVOID foods w/ a high gylcemic index    Ok to eat the carbs w/ the lowest index numbers...  We gave you the PNEUMONIA VACCINE today...  Call for any questions...  Let's plan a follow up visit in 6 months.Marland KitchenMarland Kitchen

## 2013-05-16 NOTE — Progress Notes (Signed)
Subjective:    Patient ID: Fred [name], male    DOB: 1935-05-23, 78 y.o.   MRN: [id number]  HPI 78 y/o WM here for a follow up visit...  he has multiple medical problems including HBP; CAD- s/p 4 vessel CABG in 2005; Hx PAF; Hyperlipidemia; DM on oral agents; Hx neuropathy; hx cerebrovasc dis & prev stroke w/ partial complex seizures; DJD, etc ...  he gets his meds from [facility]...   ~  November 10, 2011:  30mo ROV & Fred [name] states that he's doing well- no new complaints or concerns... He was exposed to poison oak doing his yard work & using topical cream w/ relief... He's not checking his BS regularly at home & he stopped his Januvia due to cost- still taking max Metform & Glimep, on diet & wt down 5#> all this has paid off as his BS=130 A1c=6.6 much improved, continue same!  BP remains well controlled on his BBlocker, ACE, Diuretic...    He saw DrCrenshaw 5/13> CAD, s/p CABG 2005, & felt to be stable, no changes made...    He saw DrDahlstedt 3/13> hx elev PSA, no other issues, & he remains stable... We reviewed prob list, meds, xrays and labs> see below>>  LABS 8/13:  FLP- at foals x TG 158 on Simva40+Zetia10;  Chems- ok x BS=130 A1c=6.6.Marland Kitchen.   ~  May 11, 2012:  85mo ROV & Fred [name] reports a good interval w/o new complaints or concerns;  We reviewed the following medical problems during today's office visit >>     Hx recurrent bronchitis> he remains asymptomatic w/o cough, sput, hemoptysis, SOB, CP, etc; last CXR 2/13 showed mild COPD, NAD, s/p CABG etc...    HBP> on MetopER50, Lisin10, Lasix40; BP= 138/80 & he denies CP, palpit, SOB, edema, etc...    CAD, s/p CABG, hx PAF> on [id number]; s/p 4vessel CABG 2005; Abn Myoview & re-cath in 2010 showed native vessel dis & 2/4 stents occluded- MED Rx; maintaining NSR w/o palpit...    Intracranial atherosclerotic dis & Hx stroke> on ASA325; MRI 2006 showed sm vessel dis, intracranial atherosclerotic dis on MRA, old left thalamic infarct; no neuro deficits  and no cerebral ischemic symptoms...    Chol> on Simva40, Zetia10, Niaspan500; FLP shows TChol 117, TG 121, HDL 35, LDL 58- continue same & incr exerc...    DM> on Metform1000Bid, Glimep4 (1 or 2/d?), Januv100 (out x34mo$$); not really on diet, "eating too many church socials"; BS=117, A1c=7.2, Umicroalb=8.5H; Rec to restart Januv100mg /d...    Divertics> last colonoscopy was 2010 by DrStark- divertics, hems, no lesions seen & rec for f/u 50yrs...    DJD, gout> on Allopurinol300; he notes mild discomfort in knees esp going down stairs & we discussed brace & Aleve prn... We reviewed prob list, meds, xrays and labs> see below for updates >> HE DID NOT BRING MED BOTTLES OR LIST TO THE OV TODAY!  LABS 2/14:  FLP- at goals on 19meds x HDL=35;  Chems- wnl x BS=117, A1c=7.2;  CBC- wnl;  TSH=1.79;  PSA=3.30...  ~  November 09, 2012:  81mo ROV & Fred [name] reports a good 58mo w/o complaints or concerns- he exercises doing yard work 7 recently chopping up a big tree;  We reviewed the following medical problems during today's office visit >>     Hx recurrent bronchitis> he remains asymptomatic w/o cough, sput, hemoptysis, SOB, CP, etc; last CXR 2/13 showed mild COPD, NAD, s/p CABG etc...    HBP> on MetopER50, Lisin10, Lasix40; BP=  130/74 & he denies CP, palpit, SOB, edema, etc...    CAD, s/p CABG, hx PAF> on [id number]; s/p 4vessel CABG 2005; Abn Myoview & re-cath in 2010 showed native vessel dis & 2/4 stents occluded- MED Rx; repeat Myoview 5/14 was low risk w/ old infer wall scar & mild peri-infarct ischemia, norm wall motion & EF=60%, DrCrenshaw rec continued medical therapy...    Intracranial atherosclerotic dis & Hx stroke> on ASA325; MRI 2006 showed sm vessel dis, intracranial atherosclerotic dis on MRA, old left thalamic infarct; no neuro deficits and no cerebral ischemic symptoms...    Chol> on Simva40, Zetia10, Niaspan500; FLP 8/14 shows TChol 116, TG 120, HDL 39, LDL 53- continue same & incr exerc...    DM> on  Metform1000Bid, Glimep4Bid, Januv100; not really on diet, "eating too many church socials"; BS=159, A1c=7.5; needs better diet & wt reduction...    Divertics> last colonoscopy was 2010 by DrStark- divertics, hems, no lesions seen & rec for f/u 68yrs...    DJD, gout> on Allopurinol300; he notes mild discomfort in knees esp going down stairs & we discussed brace & Aleve prn; Labs 8/14 showed Uric=4.0 We reviewed prob list, meds, xrays and labs> see below for updates >>   LABS 8/14:  FLP- at goals on Simva40, Zetia10, Niaspan500;  Chems- ok x BS=159 A1c=7.5 Uric=4.0.Marland Kitchen.  ~  May 16, 2013:  82mo ROV & Fred [name] reports a good interval, no new complaints of concerns; We reviewed the following medical problems during today's office visit >>     Hx recurrent bronchitis> he remains asymptomatic w/o cough, sput, hemoptysis, SOB, CP, etc; CXR 3/15 showed s/p CABG, mild COPD, some scarring, NAD...    HBP> on MetopER50, Lisin10, Lasix40; BP= 142/64 & he denies CP, palpit, SOB, edema, etc...    CAD, s/p CABG, hx PAF> on [id number]; s/p 4vessel CABG 2005; Abn Myoview & re-cath in 2010 showed native vessel dis & 2/4 stents occluded- MED Rx; repeat Myoview 5/14 was low risk w/ old infer wall scar & mild peri-infarct ischemia, norm wall motion & EF=60%, DrCrenshaw rec continued medical therapy...    Intracranial atherosclerotic dis & Hx stroke> on ASA325; MRI 2006 showed sm vessel dis, intracranial atherosclerotic dis on MRA, old left thalamic infarct; no neuro deficits and no cerebral ischemic symptoms...    Chol> on Simva40, Zetia10, Niaspan500; FLP 3/15 shows TChol 121, TG 129, HDL 36, LDL 59- continue same & incr exerc...    DM> on Metform1000Bid, Glimep4Qam; he cut back on Glim4 to Qam & stopped Januvia on his own; not really on diet, & asked to look up "the glycemic index"; BS=154, A1c=7.3; Rec to restart JANUVIA100, get on diet, get wt down!    Divertics> last colonoscopy was 2010 by DrStark- divertics, hems, no lesions  seen & rec for f/u 66yrs...    DJD, gout> on Allopurinol300; he notes mild discomfort in knees esp going down stairs & we discussed brace & Aleve prn; Labs 8/14 showed Uric=4.0 We reviewed prob list, meds, xrays and labs> see below for updates >> he has declined the 2014 flu vaccine; but requests PNEUMOVAX- given today...  LABS 3/15:  FLP- at goals on Simva, Zetia, Niasp;  Chems- ok x BS=154 A1c=7.3;  CBC- wnl;  TSH=1.39;  PSA=4.29;  UA- clear...   CXR 3/15 showed borderline cardiomegaly, s/p CABG, bibasilar atx/scarring, sl pleural thickening, NAD.Marland KitchenMarland Kitchen            PROBLEM LIST:     BRONCHITIS, ACUTE WITH MILD BRONCHOSPASM (ICD-466.0) -  he is an ex-smoker, quit in the 1970's... he denies cough, sputum, hemoptysis, worsening dyspnea, wheezing, chest pains, snoring, daytime hypersomnolence, etc...  ~  CXR 3/10 showed NAD- s/p med sternotomy, borderline hrt size, etc... ~  CXR 11/10 in hosp showed s/p CABG, low lung vols, basilar atx, NAD.Marland Kitchen. ~  CXR 2/13 showed borderline heart size & prior CABG. Mild COPD, NAD.Marland Kitchen. ~  CXR 3/15 showed borderline cardiomegaly, s/p CABG, bibasilar atx/scarring, sl pleural thickening, NAD...  PAROTID LESION, UNSPECIFIED (ICD-239.0) - hx of bilat parotid hypertrophy without nodules... he states normal saliva, no dry mouth etc...  HYPERTENSION (ICD-401.9) - controlled on TOPROLXL 50mg /d, ZESTRIL 10mg /d, LASIX 40mg /d...  ~  8/12:  BP= 116/70 and doing well denies HA, fatigue, visual changes, CP, palipit, dizziness, syncope, dyspnea, edema, etc... still not checking BP's at home. ~  2/13:  BP= 138/72 & doing satis w/o CP, palpit, dizzy, ch in SOB, edema, etc... ~  8/13:  BP= 122/60 & he remains largely asymptomatic... ~  2/14: on MetopER50, Lisin10, Lasix40; BP= 138/80 & he denies CP, palpit, SOB, edema, etc.  ~  3/15: on MetopER50, Lisin10, Lasix40; BP= 142/64 & he denies CP, palpit, SOB, edema, etc   C A D - S/P C A B G (ICD-414.9) - he had 4 vessel CABG in 12/05... he  has left pleural effusion post-CABG... followed by Hilary Hertz for cardiology on the above meds + ASA 325mg  daily... working on secondary risk factor reductions... min exercise w/ yard work and we discussed the need for daily ex indoors... DrCrenshaws notes reviewed>> Diastolic CHF treated w/ Lasix/ KCl. ~  Baseline EKG showed NSR, LAD, NAD... ~  Myoview 11/10 showed scar inferiorly w/ ischemia superimposed... mild HK inferiorly w/ EF=63%... ~  Cath 11/10 showed diffuse 50% proxLAD w/ 100% occlusion in mid portion (distal filled via left IM graft), mild plaque in CIRC w/ marginal branches occluded at the ostia, 100% prox RCA- PDA fills from collateral flow>> 2/4 grafts occluded... MED Rx. ~  EKG 5/13 showed NSR, rate70, LAD, IRBBB, NAD.Marland Kitchen. ~  5/13:  He had overdue f/u w/ DrCrenshaw> history of CAD s/p CABG 12/05. He had abn Myoview 11/10 w/ prior inferior infarct & moderate peri-infarct ischemia. EF=63%. Cath 11/10 showed EF=55% but his filling pressures were elevated with a LVEDP of 27 & 2 of 4 grafts were patent, started on diuretics at that time. He was felt to be stable w/o angina, same meds... ~  EKG 5/14 showed NSR, rate67, LAD, otherw wnl/ NAD... ~  Myoview 5/14 showed low risk study w/ old infer wall scar & mild peri-infarct ischemia, norm wall motion & EF=60%, DrCrenshaw rec continued medical therapy...  Hx of PAROXYSMAL ATRIAL FIBRILLATION (ICD-427.31) - no CP, palpit, dizzy, etc; appears to be maintaining NSR w/o arrhythmias...  PERIPHERAL VASCULAR DISEASE (ICD-443.9) - known small vessel dis on prev MRI in 2006 & concomitant MRA showed prominent intracranial atherosclerotic changes; on ASA daily.  HYPERLIPIDEMIA (ICD-272.4) - on SIMVASTATIN 40mg /d, ZETIA 10mg /d, NIASPAN 500mg /d & CoQ10...  ~  [place] 7/08 showed TChol 113, TG 105, HDL 32, LDL 60 ~  FLP 3/09 showed TChol 132, TG 151, HDL 35, LDL 67 ~  FLP 8/09 showed TChol 95, TG 89, HDL 30, LDL 47... rec- continue same 62meds. ~  FLP 3/10  showed TChol 119, TG 84, HDL 37, LDL 65 ~  FLP 8/10 showed TChol 115, TG 92, HDL 37, LDL 59 ~  FLP 2/11 showed TChol 117, TG 101, HDL 43, LDL 54.Marland KitchenMarland Kitchen  same meds, low fat diet, get wt down, incr exercise! ~  FLP 8/11 showed TChol 116, TG 158, HDL 31, LDL 53 ~  FLP 2/12 showed TChol 115, TG 110, HDL 34, LDL 60 ~  FLP 8/12 showed TChol 118, TG 124, HDL 40, LDL 54...continue same 49meds, & work on wt reduction... ~  [place] 2/13 showed TChol 143, TG 151, HDL 39, LDL 74 ~  FLP 8/13 showed TChol 107, TG 158, HDL 37, LDL 38 ~  FLP 2/14 on Simva40, Zetia10, Niaspan500 showed TChol 117, TG 121, HDL 35, LDL 58- continue same & incr exerc... ~  [place] 3/15 on Simva40, Zetia10, Niaspan500 showed TChol 121, TG 129, HDL 36, LDL 59- continue same & incr exerc.  DIABETES MELLITUS (ICD-250.00) - on diet + METFORMIN 1000mg Bid, GLIMEPIRIDE 4mg Bid, & off Januvia due to cost... states home checks all 90-120... ~  labs 7/08 showed FBS=94and HgA1c= 6.2 ~  labs 3/09 showed BS= 141, HgA1c= 6.9 ~  labs 8/09 showed BS= 106, HgA1c= 6.3.Marland KitchenMarland Kitchen rec- continue the same, get weight down! ~  labs 3/10 showed BS= 83, A1c= 6.5 ~  labs 8/10 showed BS= 72, A1c= 6.1.Marland Kitchen. let's try to save some $$ by decr the Actos & Januvia to 1/2/d... ~  9/10: +Diabetic Retinopathy per Ophthalmology in [place]> reminded to f/u yeqarly. ~  labs 2/11 showed BS= 101, A1c= 6.1.Marland KitchenMarland Kitchen OK to stop the Actos. ~  labs 8/11 showed BS= 113-117 ~  Labs 2/12 showed BS= 133, A1c= 7.2 ~  Labs 8/12 showed BS= 167, A1c= 7.7.Marland KitchenMarland Kitchen  Rec> keep Metform1/2Bid but incr Glimep4mg /d & Januvia100mg /d... ~  Labs 2/13 showed BS= 220, A1c= 9.3.Marland KitchenMarland Kitchen Rec> maximize meds= Metform1000Bid, Glim4Bid, Januv100/d (he stopped [place]). ~  Labs 8/13 showed BS= 130, A1c= 6.6.Marland KitchenMarland Kitchen Continue diet & same 2 meds. ~  Labs 2/14 on Metform1000Bid, Glimep4 (?1or2/d?), Januv100 (out x56mo$$); not really on diet, "eating too many church socials"; BS=117, A1c=7.2, Umicroalb=8.5H; Rec to restart Januv100mg /d.  ~  3/14:  He had  eye exam from [facility] in [place]> mild non-prolif DM retinopathy & cats... ~  Labs 8/14 on Metform1000Bid, Glim4Bid, Januv100 showed BS=159, A1c=7.5; needs better diet & wt reduction... ~  3/15: on Metform1000Bid, Glimep4Qam; he cut back on Glim4 to Qam & stopped Januvia on his own; not really on diet, & asked to look up "the glycemic index"; BS=154, A1c=7.3; Rec to restart JANUVIA100, get on diet, get wt down!  DIVERTICULOSIS (ICD-562.10) - flex sig 1997 by DrStark showed divertics, int hem... ~  3/10: referred back to GI for colonoscopy- done 06/04/08 & showed divertics, hem, otherw neg... f/u rec Prn.  UROLOGY> followed by DrDalhstedt & last seen 3/13> Hx elev PSA, & no other urinary issues by report... ~  Labs 2009 showed PSA= 4.89 ~  Labs 2/13 showed PSA= 3.3 as reported by Urology & they rec that he follow up w/ [place] once per yr... ~  Labs 2/14 showed PSA= 3.30 ~  Labs 3/15 showed PSA= 4.29 and he is referred back to DrDahlstedt...  POLYNEUROPATHY (ICD-357.2) - diabetic vs other (he was in [place] w/ exposure there)...  Hx of STROKE (ICD-434.91) - hosp in 2006 w/ MRI showing sm vessel dis, old left thalamic infarct, & atherosclerotic changes on MRA as well... he continues on ASA 81mg /d...  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 no problems in recent yrs.  DEGENERATIVE JOINT DISEASE & GOUT>> on ALLOPURINOL 300mg /d;  he uses OTC meds> Tylenol, Aleve,  etc...  Hx of ANEMIA / OTHER (ICD-285.9) ~  labs 2/11 showed Hg= 13.5 ~  Labs 2/12 showed Hg= 14.3 ~  Labs 2/13 showed Hg= 15.7 ~  Labs 2/14 showed Hg= 14.6 ~  Labs 3/15 showed Hg= 14.9   Past Surgical History  Procedure Laterality Date  . Coronary artery bypass graft    . Removal of sebaceous cyst from neck  08/2007    Dr. Luisa Hart    Outpatient Encounter Prescriptions as of 05/16/2013  Medication Sig  . allopurinol (ZYLOPRIM) 300 MG tablet TAKE 1 TABLET BY MOUTH DAILY  .  aspirin 325 MG tablet Take 325 mg by mouth daily.  . Coenzyme Q10 (COQ10) 30 MG CAPS Take 1 tablet by mouth daily.    Marland Kitchen ezetimibe (ZETIA) 10 MG tablet Take 1 tablet (10 mg total) by mouth daily.  . furosemide (LASIX) 40 MG tablet TAKE 1 TABLET BY MOUTH EVERY DAY  . glimepiride (AMARYL) 4 MG tablet Take 4 mg by mouth daily with breakfast.  . lisinopril (PRINIVIL,ZESTRIL) 10 MG tablet Take 1 tablet (10 mg total) by mouth daily.  . metFORMIN (GLUCOPHAGE) 1000 MG tablet Take 1 tablet (1,000 mg total) by mouth 2 (two) times daily with a meal.  . metoprolol succinate (TOPROL-XL) 50 MG 24 hr tablet TAKE 1 TABLET BY MOUTH EVERY DAY  . niacin (NIASPAN) 500 MG CR tablet Take 1 tablet (500 mg total) by mouth at bedtime.  . simvastatin (ZOCOR) 40 MG tablet Take 1 tablet (40 mg total) by mouth at bedtime.  . [DISCONTINUED] glimepiride (AMARYL) 4 MG tablet Take 1 tablet (4 mg total) by mouth 2 (two) times daily with a meal.  . [DISCONTINUED] sitaGLIPtin (JANUVIA) 100 MG tablet TAKE 1 TABLET BY MOUTH EVERY DAY    No Known Allergies   Current Medications, Allergies, Past Medical History, Past Surgical History, Family History, and Social History were reviewed in [employer] record.    Review of Systems         See HPI - all other systems neg except as noted...  The patient complains of dyspnea on exertion and difficulty walking.  The patient denies anorexia, fever, weight loss, weight gain, vision loss, decreased hearing, hoarseness, chest pain, syncope, peripheral edema, prolonged cough, headaches, hemoptysis, abdominal pain, melena, hematochezia, severe indigestion/heartburn, hematuria, incontinence, muscle weakness, suspicious skin lesions, transient blindness, depression, unusual weight change, abnormal bleeding, enlarged lymph nodes, and angioedema.     Objective:   Physical Exam      WD, WN, 78 y/o WM in NAD... GENERAL:  Alert & oriented; pleasant & cooperative... HEENT:   [place]/AT, EOM-wnl, PERRLA, EACs-clear, TMs-wnl, NOSE-clear, THROAT-clear & wnl. NECK:  Supple w/ fairROM; no JVD; normal carotid impulses w/o bruits; no thyromegaly or nodules palpated; no lymphadenopathy. CHEST:  Clear to P & A... no rales or signs of consolidation... HEART:  Regular Rhythm; without murmurs/ rubs/ or gallops heard... ABDOMEN:  Obese, soft & nontender; normal bowel sounds; no organomegaly or masses detected. EXT: without deformities, mild arthritic changes; no varicose veins/ +venous insuffic/ no edema. NEURO:  CN's intact; no focal neuro deficits... DERM:  No lesions noted; no rash etc...  RADIOLOGY DATA:  Reviewed in the EPIC EMR & discussed w/ the patient...  LABORATORY DATA:  Reviewed in the EPIC EMR & discussed w/ the patient...   Assessment & Plan:    HBP>  Well controlled on 3 med regimen, continue same...  CAD, s/p CABG>  Stable w/o angina; he has purchased  an exercise bike but not using it; we discussed this, continue same meds & f/u DrCrenshaw...  Hx PAF>  Aware, he has been holding NSR w/o palpit, irregularity etc...  CHOL>  FLP looks great, continue same meds, incr exercise...  DM>  On Metform 1000mg Bid, Glimep 4mg Qam & off prev Januvia- ?why adjusting meds on his own; advised to restart Januvia100 & continue Metform & glim as is; get on diet, get wt down... DM Retinopathy>  Reminded to get yearly eye exams...   GI> Divertics, Hems>  Stable & last colon 2010 was OK...  Elev PSA>  Labs showed PSA= 4.29 & he is referred back to drDahlstedt...  DJD/ GOUT>  Improved on the Allopurinol he says, uses OTC anal;gesics as needed...  Neuropathy>  DM vs other etiology...  Hx Stroke & Partial complex seizures>  On ASA 325mg /d w/o recurrent symptoms, continue same...   Patient's Medications  New Prescriptions   No medications on file  Previous Medications   ALLOPURINOL (ZYLOPRIM) 300 MG TABLET    TAKE 1 TABLET BY MOUTH DAILY   ASPIRIN 325 MG TABLET    Take 325  mg by mouth daily.   COENZYME Q10 (COQ10) 30 MG CAPS    Take 1 tablet by mouth daily.     EZETIMIBE (ZETIA) 10 MG TABLET    Take 1 tablet (10 mg total) by mouth daily.   FUROSEMIDE (LASIX) 40 MG TABLET    TAKE 1 TABLET BY MOUTH EVERY DAY   LISINOPRIL (PRINIVIL,ZESTRIL) 10 MG TABLET    Take 1 tablet (10 mg total) by mouth daily.   METFORMIN (GLUCOPHAGE) 1000 MG TABLET    Take 1 tablet (1,000 mg total) by mouth 2 (two) times daily with a meal.   METOPROLOL SUCCINATE (TOPROL-XL) 50 MG 24 HR TABLET    TAKE 1 TABLET BY MOUTH EVERY DAY   NIACIN (NIASPAN) 500 MG CR TABLET    Take 1 tablet (500 mg total) by mouth at bedtime.   SIMVASTATIN (ZOCOR) 40 MG TABLET    Take 1 tablet (40 mg total) by mouth at bedtime.  Modified Medications   Modified Medication Previous Medication   GLIMEPIRIDE (AMARYL) 4 MG TABLET glimepiride (AMARYL) 4 MG tablet      Take 4 mg by mouth daily with breakfast.    Take 1 tablet (4 mg total) by mouth 2 (two) times daily with a meal.   SITAGLIPTIN (JANUVIA) 100 MG TABLET sitaGLIPtin (JANUVIA) 100 MG tablet      TAKE 1 TABLET BY MOUTH EVERY DAY    TAKE 1 TABLET BY MOUTH EVERY DAY  Discontinued Medications   No medications on file

## 2013-05-17 ENCOUNTER — Other Ambulatory Visit: Payer: Self-pay | Admitting: Pulmonary Disease

## 2013-05-17 DIAGNOSIS — R972 Elevated prostate specific antigen [PSA]: Secondary | ICD-10-CM

## 2013-05-17 MED ORDER — SITAGLIPTIN PHOSPHATE 100 MG PO TABS: ORAL_TABLET | ORAL | Status: DC

## 2013-05-21 ENCOUNTER — Telehealth: Payer: Self-pay | Admitting: *Deleted

## 2013-05-21 NOTE — Telephone Encounter (Signed)
Pt has been set up with new primary care doctor    Dr. Ronnald Ramp at [facility] office on 11/14/2013

## 2013-06-07 ENCOUNTER — Other Ambulatory Visit: Payer: Self-pay | Admitting: Pulmonary Disease

## 2013-06-10 ENCOUNTER — Telehealth: Payer: Self-pay | Admitting: Pulmonary Disease

## 2013-06-10 NOTE — Telephone Encounter (Signed)
Called home # and is not correct.  Called alternate # and LMTCB x1

## 2013-06-11 MED ORDER — SIMVASTATIN 40 MG PO TABS: 40.0000 mg | ORAL_TABLET | Freq: Every day | ORAL | Status: DC

## 2013-06-11 MED ORDER — NIACIN ER (ANTIHYPERLIPIDEMIC) 500 MG PO TBCR: 500.0000 mg | EXTENDED_RELEASE_TABLET | Freq: Every day | ORAL | Status: DC

## 2013-06-11 MED ORDER — METFORMIN HCL 1000 MG PO TABS: 1000.0000 mg | ORAL_TABLET | Freq: Two times a day (BID) | ORAL | Status: DC

## 2013-06-11 NOTE — Telephone Encounter (Signed)
Called and Fred [name] aware. Nothing further needed

## 2013-06-11 NOTE — Telephone Encounter (Signed)
Pt's Mindy, pt's grand daughter, stopped by to p/u Rx's.  Please call at [phone].  Requests this be taken care of ASAP.  Satira Anis

## 2013-06-11 NOTE — Telephone Encounter (Signed)
I spoke with pt grand daughter and she states the pt needs a written rx for niacin, zocor, and metformin to take to [facility]. They want to pick up Rx this afternoon. Pt has an appt with Dr. Ronnald Ramp in September 2015 to establish. Rx printed and placed on SN cart to sign. Alden Bing, CMA

## 2013-06-11 NOTE — Telephone Encounter (Signed)
rx placed up front and are ready to be picked up.  thanks

## 2013-06-12 HISTORY — PX: CATARACT EXTRACTION W/ INTRAOCULAR LENS  IMPLANT, BILATERAL: SHX1307

## 2013-07-10 DIAGNOSIS — R972 Elevated prostate specific antigen [PSA]: Secondary | ICD-10-CM | POA: Diagnosis not present

## 2013-07-29 ENCOUNTER — Ambulatory Visit (INDEPENDENT_AMBULATORY_CARE_PROVIDER_SITE_OTHER): Payer: Medicare Other | Admitting: Cardiology

## 2013-07-29 ENCOUNTER — Encounter: Payer: Self-pay | Admitting: Cardiology

## 2013-07-29 VITALS — BP 132/68 | HR 69 | Ht 63.0 in | Wt 191.8 lb

## 2013-07-29 DIAGNOSIS — E785 Hyperlipidemia, unspecified: Secondary | ICD-10-CM

## 2013-07-29 DIAGNOSIS — I4891 Unspecified atrial fibrillation: Secondary | ICD-10-CM | POA: Diagnosis not present

## 2013-07-29 DIAGNOSIS — I10 Essential (primary) hypertension: Secondary | ICD-10-CM | POA: Diagnosis not present

## 2013-07-29 DIAGNOSIS — I509 Heart failure, unspecified: Secondary | ICD-10-CM | POA: Diagnosis not present

## 2013-07-29 DIAGNOSIS — I259 Chronic ischemic heart disease, unspecified: Secondary | ICD-10-CM | POA: Diagnosis not present

## 2013-07-29 NOTE — Assessment & Plan Note (Signed)
Continue aspirin and statin. 

## 2013-07-29 NOTE — Assessment & Plan Note (Signed)
Blood pressure controlled. Continue present medications. Potassium and renal function monitored by primary care. 

## 2013-07-29 NOTE — Progress Notes (Signed)
HPI: FU CAD; history of coronary artery disease status post coronary artery bypass graft in December 2005. Cardiac catheterization. on February 02, 2009 showed an ejection fraction of 55% but his filling pressures were elevated with a LVEDP of 27. 2 of 4 grafts were patent. He was started on diuretics at that time. Nuclear study May of 2014 showed EF 60, inferior MI and mild peri-infarct ischemia. I last saw him in May of 2014. Since then, the patient denies any dyspnea on exertion, orthopnea, PND, pedal edema, palpitations, syncope or chest pain.   Current Outpatient Prescriptions  Medication Sig Dispense Refill  . allopurinol (ZYLOPRIM) 300 MG tablet TAKE 1 TABLET BY MOUTH DAILY  90 tablet  4  . aspirin 325 MG tablet Take 325 mg by mouth daily.      . Coenzyme Q10 (COQ10) 30 MG CAPS Take 1 tablet by mouth daily.        Marland Kitchen ezetimibe (ZETIA) 10 MG tablet Take 1 tablet (10 mg total) by mouth daily.  90 tablet  3  . furosemide (LASIX) 40 MG tablet TAKE 1 TABLET BY MOUTH EVERY DAY  30 tablet  1  . glimepiride (AMARYL) 4 MG tablet Take 4 mg by mouth daily with breakfast.      . lisinopril (PRINIVIL,ZESTRIL) 10 MG tablet Take 1 tablet (10 mg total) by mouth daily.  90 tablet  3  . metFORMIN (GLUCOPHAGE) 1000 MG tablet Take 1 tablet (1,000 mg total) by mouth 2 (two) times daily with a meal.  180 tablet  2  . metoprolol succinate (TOPROL-XL) 50 MG 24 hr tablet TAKE 1 TABLET BY MOUTH EVERY DAY  90 tablet  2  . niacin (NIASPAN) 500 MG CR tablet Take 1 tablet (500 mg total) by mouth at bedtime.  90 tablet  2  . simvastatin (ZOCOR) 40 MG tablet Take 1 tablet (40 mg total) by mouth at bedtime.  90 tablet  2  . sitaGLIPtin (JANUVIA) 100 MG tablet TAKE 1 TABLET BY MOUTH EVERY DAY  90 tablet  1   No current facility-administered medications for this visit.     Past Medical History  Diagnosis Date  . 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 Hypertension   . CAD (coronary artery  disease)     s/p cabg  . CHF (congestive heart failure)   . Peripheral vascular disease   . History of stroke   . Hyperlipidemia   . DM (diabetes mellitus)   . Diverticulosis   . Polyneuropathy, diabetic   . Seizure disorder, complex partial   . DJD (degenerative joint disease)   . History of anemia   . Atrial fibrillation     Post operative    Past Surgical History  Procedure Laterality Date  . Coronary artery bypass graft    . Removal of sebaceous cyst from neck  08/2007    Dr. [name]    History   Social History  . Marital Status: Married    Spouse Name: Anikin Prosser    Number of Children: 3  . Years of Education: N/A   Occupational History  . Not on file.   Social History Main Topics  . Smoking status: Former Smoker -- 1.00 packs/day for 22 years    Types: Cigarettes    Quit date: 03/14/1977  . Smokeless tobacco: Never Used  . Alcohol Use: No  . Drug Use: No  . Sexual Activity: Not on file   Other Topics  Concern  . Not on file   Social History Narrative  . No narrative on file    ROS: no fevers or chills, productive cough, hemoptysis, dysphasia, odynophagia, melena, hematochezia, dysuria, hematuria, rash, seizure activity, orthopnea, PND, pedal edema, claudication. Remaining systems are negative.  Physical Exam: Well-developed well-nourished in no acute distress.  Skin is warm and dry.  HEENT is normal.  Neck is supple.  Chest is clear to auscultation with normal expansion.  Cardiovascular exam is regular rate and rhythm.  Abdominal exam nontender or distended. No masses palpated. Extremities show no edema. neuro grossly intact  ECG Sinus rhythmAt a rate of 70. Left anterior fascicular block.

## 2013-07-29 NOTE — Assessment & Plan Note (Signed)
Continue present dose of Lasix. 

## 2013-07-29 NOTE — Patient Instructions (Signed)
Your physician wants you to follow-up in: ONE YEAR WITH DR CRENSHAW You will receive a reminder letter in the mail two months in advance. If you don't receive a letter, please call our office to schedule the follow-up appointment.  

## 2013-07-29 NOTE — Assessment & Plan Note (Signed)
Continue present medications. Lipids and liver monitored by primary care.

## 2013-09-09 ENCOUNTER — Telehealth: Payer: Self-pay | Admitting: Pulmonary Disease

## 2013-09-09 MED ORDER — LISINOPRIL 10 MG PO TABS: 10.0000 mg | ORAL_TABLET | Freq: Every day | ORAL | Status: DC

## 2013-09-09 NOTE — Telephone Encounter (Signed)
rx has been printed out and will place on SN cart to be signed and will call the pt once this is ready to be picked up.

## 2013-09-10 MED ORDER — LISINOPRIL 10 MG PO TABS: 10.0000 mg | ORAL_TABLET | Freq: Every day | ORAL | Status: DC

## 2013-09-10 NOTE — Telephone Encounter (Signed)
[phone] calling back

## 2013-09-10 NOTE — Telephone Encounter (Signed)
Called and spoke with pts wife and she is aware of rx that is up front and ready to be picked up.

## 2013-10-15 ENCOUNTER — Other Ambulatory Visit: Payer: Self-pay | Admitting: Pulmonary Disease

## 2013-11-14 ENCOUNTER — Ambulatory Visit: Payer: Medicare Other | Admitting: Internal Medicine

## 2013-11-22 ENCOUNTER — Other Ambulatory Visit: Payer: Self-pay | Admitting: Pulmonary Disease

## 2013-11-29 ENCOUNTER — Ambulatory Visit (INDEPENDENT_AMBULATORY_CARE_PROVIDER_SITE_OTHER): Payer: Medicare Other | Admitting: Family Medicine

## 2013-11-29 ENCOUNTER — Encounter: Payer: Self-pay | Admitting: Family Medicine

## 2013-11-29 VITALS — BP 136/75 | HR 68 | Ht 63.0 in | Wt 195.0 lb

## 2013-11-29 DIAGNOSIS — Z23 Encounter for immunization: Secondary | ICD-10-CM | POA: Diagnosis not present

## 2013-11-29 DIAGNOSIS — E785 Hyperlipidemia, unspecified: Secondary | ICD-10-CM | POA: Diagnosis not present

## 2013-11-29 DIAGNOSIS — I259 Chronic ischemic heart disease, unspecified: Secondary | ICD-10-CM

## 2013-11-29 DIAGNOSIS — I10 Essential (primary) hypertension: Secondary | ICD-10-CM | POA: Diagnosis not present

## 2013-11-29 DIAGNOSIS — E669 Obesity, unspecified: Secondary | ICD-10-CM | POA: Insufficient documentation

## 2013-11-29 DIAGNOSIS — E119 Type 2 diabetes mellitus without complications: Secondary | ICD-10-CM | POA: Diagnosis not present

## 2013-11-29 LAB — POCT UA - MICROALBUMIN: Microalbumin Ur, POC: 80 mg/L

## 2013-11-29 LAB — POCT GLYCOSYLATED HEMOGLOBIN (HGB A1C): HEMOGLOBIN A1C: 6.4

## 2013-11-29 MED ORDER — DIAZEPAM 5 MG PO TABS: 5.0000 mg | ORAL_TABLET | Freq: Every evening | ORAL | Status: DC | PRN

## 2013-11-29 MED ORDER — EZETIMIBE 10 MG PO TABS: 10.0000 mg | ORAL_TABLET | Freq: Every day | ORAL | Status: DC

## 2013-11-29 NOTE — Assessment & Plan Note (Signed)
Well-controlled on current regimen.  Follow-up in 6 months. 

## 2013-11-29 NOTE — Assessment & Plan Note (Signed)
Well-controlled. Hemoglobin A1c looks fantastic today at 6.4 with the addition of Januvia. He is tolerating it well without any side effects. Followup in 3 months. Call if any problems. Monofilament exam performed today. Urine microalbumin performed today. We will also call [facility] as surgeons to get his most up-to-date eye visit.

## 2013-11-29 NOTE — Progress Notes (Signed)
   Subjective:    Patient ID: Fred [name], male    DOB: 1936/03/11, 78 y.o.   MRN: [id number]  HPI Diabetes - no hypoglycemic events. No wounds or sores that are not healing well. No increased thirst or urination. Checking glucose at home. Taking medications as prescribed without any side effects. Last A1c was 7.3. Per notes in the system he was encouraged to restart Januvia. No S.E.   Lab Results  Component Value Date   HGBA1C 7.3* 05/16/2013    Hypertension- Pt denies chest pain, SOB, dizziness, or heart palpitations.  Taking meds as directed w/o problems.  Denies medication side effects.  He does have a history of congestive heart failure, coronary artery disease and atrial fibrillation.   Review of Systems  Constitutional: Negative for fever, diaphoresis and unexpected weight change.  HENT: Negative for hearing loss, rhinorrhea, sneezing and tinnitus.   Eyes: Negative for visual disturbance.  Respiratory: Negative for cough and wheezing.   Cardiovascular: Negative for chest pain and palpitations.  Gastrointestinal: Negative for nausea, vomiting, diarrhea and blood in stool.  Genitourinary: Negative for dysuria and discharge.  Musculoskeletal: Negative for arthralgias and myalgias.  Skin: Negative for rash.  Neurological: Negative for headaches.  Hematological: Negative for adenopathy.  Psychiatric/Behavioral: Negative for sleep disturbance and dysphoric mood. The patient is not nervous/anxious.        Objective:   Physical Exam  Constitutional: He is oriented to person, place, and time. He appears well-developed and well-nourished.  HENT:  Head: Normocephalic and atraumatic.  Cardiovascular: Normal rate, regular rhythm and normal heart sounds.   No carotid or abdominal bruits.   Pulmonary/Chest: Effort normal and breath sounds normal.  Abdominal: Soft. Bowel sounds are normal. He exhibits no distension and no mass. There is no tenderness. There is no rebound and no  guarding.  + diastasis rectus.   Neurological: He is alert and oriented to person, place, and time.  Skin: Skin is warm and dry.  Psychiatric: He has a normal mood and affect. His behavior is normal.          Assessment & Plan:  Flu shot given today.    Obesity-discussed the importance of exercise and weight loss. Let us see him lose 15 pounds. Encouraged him to really work on this over the next 3-6 months.

## 2013-11-29 NOTE — Assessment & Plan Note (Signed)
Lipids were at goal in March. He is on simvastatin 40 mg. When he runs out of his current medication consider switching to Lipitor 80 mg since he has known coronary artery disease.

## 2013-11-30 LAB — BASIC METABOLIC PANEL WITH GFR
BUN: 17 mg/dL (ref 6–23)
CALCIUM: 9.5 mg/dL (ref 8.4–10.5)
CO2: 26 mEq/L (ref 19–32)
Chloride: 107 mEq/L (ref 96–112)
Creat: 1.23 mg/dL (ref 0.50–1.35)
GFR, EST AFRICAN AMERICAN: 66 mL/min
GFR, EST NON AFRICAN AMERICAN: 57 mL/min — AB
Glucose, Bld: 127 mg/dL — ABNORMAL HIGH (ref 70–99)
Potassium: 5.2 mEq/L (ref 3.5–5.3)
SODIUM: 139 meq/L (ref 135–145)

## 2013-12-02 NOTE — Progress Notes (Signed)
Quick Note:  All labs are normal. ______ 

## 2013-12-27 ENCOUNTER — Other Ambulatory Visit: Payer: Self-pay

## 2014-01-10 ENCOUNTER — Other Ambulatory Visit: Payer: Self-pay | Admitting: Pulmonary Disease

## 2014-01-14 ENCOUNTER — Other Ambulatory Visit: Payer: Self-pay | Admitting: Pulmonary Disease

## 2014-01-16 ENCOUNTER — Other Ambulatory Visit: Payer: Self-pay | Admitting: Family Medicine

## 2014-02-22 ENCOUNTER — Other Ambulatory Visit: Payer: Self-pay | Admitting: Pulmonary Disease

## 2014-02-28 ENCOUNTER — Ambulatory Visit: Payer: Medicare Other | Admitting: Family Medicine

## 2014-03-03 ENCOUNTER — Encounter: Payer: Self-pay | Admitting: Family Medicine

## 2014-03-03 ENCOUNTER — Ambulatory Visit (INDEPENDENT_AMBULATORY_CARE_PROVIDER_SITE_OTHER): Payer: Medicare Other | Admitting: Family Medicine

## 2014-03-03 VITALS — BP 122/80 | HR 66 | Wt 197.0 lb

## 2014-03-03 DIAGNOSIS — I259 Chronic ischemic heart disease, unspecified: Secondary | ICD-10-CM | POA: Diagnosis not present

## 2014-03-03 DIAGNOSIS — E119 Type 2 diabetes mellitus without complications: Secondary | ICD-10-CM | POA: Diagnosis not present

## 2014-03-03 DIAGNOSIS — E785 Hyperlipidemia, unspecified: Secondary | ICD-10-CM

## 2014-03-03 DIAGNOSIS — I10 Essential (primary) hypertension: Secondary | ICD-10-CM | POA: Diagnosis not present

## 2014-03-03 DIAGNOSIS — Z23 Encounter for immunization: Secondary | ICD-10-CM

## 2014-03-03 LAB — POCT GLYCOSYLATED HEMOGLOBIN (HGB A1C): Hemoglobin A1C: 6.8

## 2014-03-03 MED ORDER — ATORVASTATIN CALCIUM 40 MG PO TABS: 40.0000 mg | ORAL_TABLET | Freq: Every day | ORAL | Status: DC

## 2014-03-03 MED ORDER — AMBULATORY NON FORMULARY MEDICATION: Status: DC

## 2014-03-03 NOTE — Patient Instructions (Signed)
We will need to do bloodwork at your next visit.

## 2014-03-03 NOTE — Progress Notes (Signed)
   Subjective:    Patient ID: Fred [name], male    DOB: 07-26-1935, 78 y.o.   MRN: [id number]  HPI Diabetes - no hypoglycemic events. No wounds or sores that are not healing well. No increased thirst or urination. Checking glucose at home. Taking medications as prescribed without any side effects.  Hypertension- Pt denies chest pain, SOB, dizziness, or heart palpitations.  Taking meds as directed w/o problems.  Denies medication side effects.    Hyperlipidemia-he's currently on simvastatin and will run out of his prescription and asked couple of weeks. We discussed switching him to Lipitor when he ran out. Lab Results  Component Value Date   CHOL 121 05/16/2013   HDL 36.10* 05/16/2013   LDLCALC 59 05/16/2013   TRIG 129.0 05/16/2013   CHOLHDL 3 05/16/2013     Review of Systems     Objective:   Physical Exam  Constitutional: He is oriented to person, place, and time. He appears well-developed and well-nourished.  HENT:  Head: Normocephalic and atraumatic.  Cardiovascular: Normal rate, regular rhythm and normal heart sounds.   No carotid bruits  Pulmonary/Chest: Effort normal and breath sounds normal.  Neurological: He is alert and oriented to person, place, and time.  Skin: Skin is warm and dry.  Psychiatric: He has a normal mood and affect. His behavior is normal.          Assessment & Plan:  Diabetes-well-controlled. Hemoglobin A1c is 6.8 today. Up a little bit from previous but overall still well controlled. Encouraged him to just make sure that he is being careful about his diet and getting regular exercise especially around the holidays. Otherwise lab work etc. is up-to-date.  Hyperlipidemia-will discontinue simvastatin and switch to Lipitor because of little bit better data as far as reduction and morbidity mortality. I suspect he will do well with the transition. We will repeat CMP and fasting lipid panel at his follow-up visit in 3 months.  Hypertension-we will  recheck his blood pressure today. Repeat blood pressure was at goal which is fantastic. We'll continue to monitor carefully.

## 2014-03-20 ENCOUNTER — Other Ambulatory Visit: Payer: Self-pay

## 2014-03-20 MED ORDER — NIACIN ER (ANTIHYPERLIPIDEMIC) 500 MG PO TBCR: 500.0000 mg | EXTENDED_RELEASE_TABLET | Freq: Every day | ORAL | Status: DC

## 2014-03-20 MED ORDER — METFORMIN HCL 1000 MG PO TABS: 1000.0000 mg | ORAL_TABLET | Freq: Two times a day (BID) | ORAL | Status: DC

## 2014-04-05 ENCOUNTER — Other Ambulatory Visit: Payer: Self-pay | Admitting: Pulmonary Disease

## 2014-04-07 ENCOUNTER — Other Ambulatory Visit: Payer: Self-pay | Admitting: Family Medicine

## 2014-04-19 ENCOUNTER — Other Ambulatory Visit: Payer: Self-pay | Admitting: Pulmonary Disease

## 2014-04-21 ENCOUNTER — Other Ambulatory Visit: Payer: Self-pay | Admitting: Family Medicine

## 2014-04-22 NOTE — Telephone Encounter (Signed)
Last filled by Deborra Medina. Fred Gilford, MD

## 2014-05-30 ENCOUNTER — Other Ambulatory Visit: Payer: Self-pay | Admitting: Family Medicine

## 2014-06-02 ENCOUNTER — Ambulatory Visit (INDEPENDENT_AMBULATORY_CARE_PROVIDER_SITE_OTHER): Payer: Medicare Other | Admitting: Family Medicine

## 2014-06-02 ENCOUNTER — Encounter: Payer: Self-pay | Admitting: Family Medicine

## 2014-06-02 VITALS — BP 124/64 | HR 66 | Wt 196.0 lb

## 2014-06-02 DIAGNOSIS — E785 Hyperlipidemia, unspecified: Secondary | ICD-10-CM | POA: Diagnosis not present

## 2014-06-02 DIAGNOSIS — E119 Type 2 diabetes mellitus without complications: Secondary | ICD-10-CM

## 2014-06-02 DIAGNOSIS — R05 Cough: Secondary | ICD-10-CM

## 2014-06-02 DIAGNOSIS — I10 Essential (primary) hypertension: Secondary | ICD-10-CM

## 2014-06-02 DIAGNOSIS — R059 Cough, unspecified: Secondary | ICD-10-CM

## 2014-06-02 LAB — COMPLETE METABOLIC PANEL WITH GFR
ALK PHOS: 50 U/L (ref 39–117)
ALT: 16 U/L (ref 0–53)
AST: 16 U/L (ref 0–37)
Albumin: 4.4 g/dL (ref 3.5–5.2)
BILIRUBIN TOTAL: 0.5 mg/dL (ref 0.2–1.2)
BUN: 18 mg/dL (ref 6–23)
CO2: 29 mEq/L (ref 19–32)
CREATININE: 1.02 mg/dL (ref 0.50–1.35)
Calcium: 9.6 mg/dL (ref 8.4–10.5)
Chloride: 105 mEq/L (ref 96–112)
GFR, Est African American: 82 mL/min
GFR, Est Non African American: 71 mL/min
Glucose, Bld: 161 mg/dL — ABNORMAL HIGH (ref 70–99)
Potassium: 4.6 mEq/L (ref 3.5–5.3)
Sodium: 140 mEq/L (ref 135–145)
Total Protein: 7.3 g/dL (ref 6.0–8.3)

## 2014-06-02 LAB — LIPID PANEL
Cholesterol: 125 mg/dL (ref 0–200)
HDL: 36 mg/dL — ABNORMAL LOW (ref >=40)
LDL CALC: 58 mg/dL (ref 0–99)
TRIGLYCERIDES: 154 mg/dL — AB (ref <150)
Total CHOL/HDL Ratio: 3.5 Ratio
VLDL: 31 mg/dL (ref 0–40)

## 2014-06-02 LAB — POCT GLYCOSYLATED HEMOGLOBIN (HGB A1C): HEMOGLOBIN A1C: 7.2

## 2014-06-02 NOTE — Progress Notes (Signed)
   Subjective:    Patient ID: Fred [name], male    DOB: 1935/10/17, 79 y.o.   MRN: [id number]  HPI Diabetes - no hypoglycemic events. No wounds or sores that are not healing well. No increased thirst or urination. Checking glucose at home. Taking medications as prescribed without any side effects. On Januvia, Glucophage, Amaryl.  Hypertension- Pt denies chest pain, SOB, dizziness, or heart palpitations.  Taking meds as directed w/o problems.  Denies medication side effects.    Hyperlipidemia - We switched him to lipitor since I last saw him. He is still on Zetia too. He hasn't started it yet.   He did get his shingles vaccine in January.   He did mention today that when he sleeps on his left side at night he develops a cough. He says this started about 11 years ago right after his artery bypass surgery. It hasn't changed or gotten worse but he just wanted to mention it. He says if he sleeps on his right side it doesn't bother him at all.  Review of Systems     Objective:   Physical Exam  Constitutional: He is oriented to person, place, and time. He appears well-developed and well-nourished.  HENT:  Head: Normocephalic and atraumatic.  Cardiovascular: Normal rate, regular rhythm and normal heart sounds.   Pulmonary/Chest: Effort normal and breath sounds normal.  Neurological: He is alert and oriented to person, place, and time.  Skin: Skin is warm and dry.  Psychiatric: He has a normal mood and affect. His behavior is normal.          Assessment & Plan:  DM-  Unccontrolled.  A1C up to 7.2, from 6.8 3 monht ago. He really wants to work on diet and exercise and f/u in 3 months. On ASA or statin and ACE.   Hypertension- well controlled. F/U in 3 months.   Hyperlipidemia - Doing well.  Once starts new med, Lipitor,  then will recheck lipids.    Cough on the left side-could be reflux related since it definitely is positional. Last chest x-ray was about a year ago. Offered to do  a repeat chest x-ray today but he declined and says he has a follow-up  with Dr. Stanford Breed in May.

## 2014-06-21 ENCOUNTER — Other Ambulatory Visit: Payer: Self-pay | Admitting: Pulmonary Disease

## 2014-06-21 ENCOUNTER — Other Ambulatory Visit: Payer: Self-pay | Admitting: Family Medicine

## 2014-07-04 ENCOUNTER — Other Ambulatory Visit: Payer: Self-pay | Admitting: Family Medicine

## 2014-07-04 DIAGNOSIS — R972 Elevated prostate specific antigen [PSA]: Secondary | ICD-10-CM | POA: Diagnosis not present

## 2014-07-04 NOTE — Telephone Encounter (Signed)
Okay to fill for 90 day supply.

## 2014-07-04 NOTE — Telephone Encounter (Signed)
Mindy (Pt's granddaughter) called to request refills on the following medications:Januvia, and Lisinopril. They are requesting all of these medications (with the exception of Valium) to be filled as 90 day supply with 3 refills. They are taking the Rx's to [facility] to be filled so they will be free. Please advise if you are OK with these refills and quantities.

## 2014-07-07 MED ORDER — SITAGLIPTIN PHOSPHATE 100 MG PO TABS: 100.0000 mg | ORAL_TABLET | Freq: Every day | ORAL | Status: DC

## 2014-07-07 MED ORDER — LISINOPRIL 10 MG PO TABS: 10.0000 mg | ORAL_TABLET | Freq: Every day | ORAL | Status: DC

## 2014-07-07 NOTE — Telephone Encounter (Signed)
Printed and gave to patient 

## 2014-07-10 ENCOUNTER — Other Ambulatory Visit: Payer: Self-pay | Admitting: Family Medicine

## 2014-07-11 DIAGNOSIS — R972 Elevated prostate specific antigen [PSA]: Secondary | ICD-10-CM | POA: Diagnosis not present

## 2014-07-30 DIAGNOSIS — E11329 Type 2 diabetes mellitus with mild nonproliferative diabetic retinopathy without macular edema: Secondary | ICD-10-CM | POA: Diagnosis not present

## 2014-07-30 DIAGNOSIS — H527 Unspecified disorder of refraction: Secondary | ICD-10-CM | POA: Diagnosis not present

## 2014-07-30 DIAGNOSIS — H43813 Vitreous degeneration, bilateral: Secondary | ICD-10-CM | POA: Diagnosis not present

## 2014-07-30 DIAGNOSIS — H35372 Puckering of macula, left eye: Secondary | ICD-10-CM | POA: Diagnosis not present

## 2014-07-30 DIAGNOSIS — H11001 Unspecified pterygium of right eye: Secondary | ICD-10-CM | POA: Diagnosis not present

## 2014-07-30 LAB — HM DIABETES EYE EXAM

## 2014-08-08 ENCOUNTER — Encounter: Payer: Self-pay | Admitting: Internal Medicine

## 2014-08-18 ENCOUNTER — Telehealth: Payer: Self-pay | Admitting: Cardiology

## 2014-08-18 NOTE — Telephone Encounter (Signed)
Called back regarding problem. Pt reports non-productive cough, going on for about ~1 month.  No weight gain reported, pt denies SOB.  Has been seen by Dr. Lenna Gilford in the past and care was transitioned to Dr. Ward Chatters after Dr. Lenna Gilford retired.  Advised to f/u w/ [facility] to r/u cold symptoms, possible med SE's (does report taking ACE). Pt voiced understanding.

## 2014-08-18 NOTE — Telephone Encounter (Signed)
New Message  Picked up a cough over the last month that has gotten worse. He mainly coughs when he lays down on his left side. He seems a little winded when he walks and when this happens he goes into a coughing spell. No other Symptoms

## 2014-08-19 ENCOUNTER — Encounter: Payer: Self-pay | Admitting: Family Medicine

## 2014-08-19 ENCOUNTER — Ambulatory Visit (INDEPENDENT_AMBULATORY_CARE_PROVIDER_SITE_OTHER): Payer: Medicare Other

## 2014-08-19 ENCOUNTER — Ambulatory Visit (INDEPENDENT_AMBULATORY_CARE_PROVIDER_SITE_OTHER): Payer: Medicare Other | Admitting: Family Medicine

## 2014-08-19 VITALS — BP 118/61 | HR 68 | Temp 97.6°F | Wt 194.0 lb

## 2014-08-19 DIAGNOSIS — R05 Cough: Secondary | ICD-10-CM

## 2014-08-19 DIAGNOSIS — R0602 Shortness of breath: Secondary | ICD-10-CM

## 2014-08-19 DIAGNOSIS — R059 Cough, unspecified: Secondary | ICD-10-CM

## 2014-08-19 LAB — CBC WITH DIFFERENTIAL/PLATELET
BASOS ABS: 0.1 10*3/uL (ref 0.0–0.1)
Basophils Relative: 1 % (ref 0–1)
Eosinophils Absolute: 0.4 10*3/uL (ref 0.0–0.7)
Eosinophils Relative: 5 % (ref 0–5)
HEMATOCRIT: 42.5 % (ref 39.0–52.0)
Hemoglobin: 14.4 g/dL (ref 13.0–17.0)
LYMPHS ABS: 2.9 10*3/uL (ref 0.7–4.0)
Lymphocytes Relative: 37 % (ref 12–46)
MCH: 31.9 pg (ref 26.0–34.0)
MCHC: 33.9 g/dL (ref 30.0–36.0)
MCV: 94.2 fL (ref 78.0–100.0)
MONOS PCT: 8 % (ref 3–12)
MPV: 11.3 fL (ref 8.6–12.4)
Monocytes Absolute: 0.6 10*3/uL (ref 0.1–1.0)
NEUTROS ABS: 3.8 10*3/uL (ref 1.7–7.7)
Neutrophils Relative %: 49 % (ref 43–77)
PLATELETS: 278 10*3/uL (ref 150–400)
RBC: 4.51 MIL/uL (ref 4.22–5.81)
RDW: 14.3 % (ref 11.5–15.5)
WBC: 7.8 10*3/uL (ref 4.0–10.5)

## 2014-08-19 NOTE — Progress Notes (Signed)
Subjective:    Patient ID: Fred [name], male    DOB: 1935-06-24, 79 y.o.   MRN: [id number]  HPI Cough x 1 month. Says the cough is a tickle.  Say worse when lays on his left side.  No fever, chills. Some SOB with acitivity. Family is worried about fluid.  No swelling or inc in weight. No ST. Nasal congestion. No wheezing. No GERD. Former smoker. Positive family history of lung cancer. No recent allergy sxs or post nasal drip.    Review of Systems BP 118/61 mmHg  Pulse 68  Temp(Src) 97.6 F (36.4 C) (Oral)  Wt 194 lb (87.998 kg)  SpO2 95%    No Known Allergies  Past Medical History  Diagnosis Date  . 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 Hypertension   . CAD (coronary artery disease)     s/p cabg  . CHF (congestive heart failure)   . Peripheral vascular disease   . History of stroke   . Hyperlipidemia   . DM (diabetes mellitus)   . Diverticulosis   . Polyneuropathy, diabetic   . Seizure disorder, complex partial   . DJD (degenerative joint disease)   . History of anemia   . Atrial fibrillation     Post operative    Past Surgical History  Procedure Laterality Date  . Coronary artery bypass graft  02/13/2004    4 vesel  . Removal of sebaceous cyst from neck  08/2007    Dr. [name]  . Cataract extraction w/ intraocular lens  implant, bilateral  06/2013    Dr. [name]  . Tonsillectomy      History   Social History  . Marital Status: Married    Spouse Name: Jaimin Krupka  . Number of Children: 3  . Years of Education: N/A   Occupational History  . 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 Smoking status: Former Smoker -- 1.00 packs/day for 22 years    Types: Cigarettes    Quit date: 03/14/1977  . Smokeless tobacco: Never Used  . Alcohol Use: No  . Drug Use: No  . Sexual Activity: No   Other Topics Concern  . Not on file   Social History Narrative   3 caffeinated  drinks per day. Mostly coffee. He walks 15 minutes 3 days per week. Quit smoking in 1978.    Family History  Problem Relation Age of Onset  . Lung cancer    . Epilepsy      Outpatient Encounter Prescriptions as of 08/19/2014  Medication Sig  . allopurinol (ZYLOPRIM) 300 MG tablet TAKE 1 TABLET BY MOUTH EVERY DAY  . AMBULATORY NON FORMULARY MEDICATION Medication Name:shingles vaccine IM x 1  . aspirin 325 MG tablet Take 325 mg by mouth daily.  Marland Kitchen atorvastatin (LIPITOR) 40 MG tablet Take 1 tablet (40 mg total) by mouth daily.  . Coenzyme Q10 (COQ10) 30 MG CAPS Take 1 tablet by mouth daily.    Marland Kitchen ezetimibe (ZETIA) 10 MG tablet Take 1 tablet (10 mg total) by mouth daily.  . furosemide (LASIX) 40 MG tablet TAKE 1 TABLET BY MOUTH EVERY DAY (Patient taking differently: TAKE 1 TABLET BY MOUTH EVERY DAY PRN)  . glimepiride (AMARYL) 4 MG tablet TAKE 1 TABLET BY MOUTH EVERY DAY BEFORE BREAKFAST  . lisinopril (PRINIVIL,ZESTRIL) 10 MG tablet Take 1 tablet (10 mg total) by mouth daily.  Marland Kitchen  metFORMIN (GLUCOPHAGE) 1000 MG tablet Take 1 tablet (1,000 mg total) by mouth 2 (two) times daily with a meal.  . metoprolol succinate (TOPROL-XL) 50 MG 24 hr tablet TAKE 1 TABLET BY MOUTH EVERY DAY  . niacin (NIASPAN) 500 MG CR tablet Take 1 tablet (500 mg total) by mouth at bedtime.  . sitaGLIPtin (JANUVIA) 100 MG tablet Take 1 tablet (100 mg total) by mouth daily.   No facility-administered encounter medications on file as of 08/19/2014.           Objective:   Physical Exam  Constitutional: He is oriented to person, place, and time. He appears well-developed and well-nourished.  HENT:  Head: Normocephalic and atraumatic.  Right Ear: External ear normal.  Left Ear: External ear normal.  Nose: Nose normal.  Mouth/Throat: Oropharynx is clear and moist.  TMs and canals are clear.   Eyes: Conjunctivae and EOM are normal. Pupils are equal, round, and reactive to light.  Neck: Neck supple. No thyromegaly  present.  Cardiovascular: Normal rate and normal heart sounds.   Pulmonary/Chest: Effort normal and breath sounds normal.  Lymphadenopathy:    He has no cervical adenopathy.  Neurological: He is alert and oriented to person, place, and time.  Skin: Skin is warm and dry.  Psychiatric: He has a normal mood and affect.          Assessment & Plan:  Persistent cough 1 month-most likely culprit at this point is his ACE inhibitor. No sign of reflux, postnasal drip, allergies, infection. No fevers or chills. His daughter feels like he's been a little bit more winded but he himself doesn't feel particularly short of breath. He is a former smoker and has a family history lung cancer. We'll go ahead and move forward chest x-ray today just to rule out fluid, infection or mass. If everything is normal then we'll have him hold his ACE inhibitor and see if the cough resolves in the next couple weeks.

## 2014-08-20 NOTE — Progress Notes (Signed)
Quick Note:  All labs are normal. ______ 

## 2014-09-02 ENCOUNTER — Ambulatory Visit (INDEPENDENT_AMBULATORY_CARE_PROVIDER_SITE_OTHER): Payer: Medicare Other | Admitting: Family Medicine

## 2014-09-02 ENCOUNTER — Encounter: Payer: Self-pay | Admitting: Family Medicine

## 2014-09-02 VITALS — BP 131/90 | HR 68 | Wt 194.0 lb

## 2014-09-02 DIAGNOSIS — I10 Essential (primary) hypertension: Secondary | ICD-10-CM

## 2014-09-02 DIAGNOSIS — E119 Type 2 diabetes mellitus without complications: Secondary | ICD-10-CM

## 2014-09-02 DIAGNOSIS — R058 Other specified cough: Secondary | ICD-10-CM

## 2014-09-02 DIAGNOSIS — R05 Cough: Secondary | ICD-10-CM

## 2014-09-02 DIAGNOSIS — T464X5A Adverse effect of angiotensin-converting-enzyme inhibitors, initial encounter: Secondary | ICD-10-CM

## 2014-09-02 LAB — POCT GLYCOSYLATED HEMOGLOBIN (HGB A1C): Hemoglobin A1C: 7.2

## 2014-09-02 MED ORDER — LOSARTAN POTASSIUM 25 MG PO TABS: 25.0000 mg | ORAL_TABLET | Freq: Every day | ORAL | Status: DC

## 2014-09-02 NOTE — Progress Notes (Signed)
   Subjective:    Patient ID: Fred [name], male    DOB: 08-26-1935, 79 y.o.   MRN: [id number]  HPI Diabetes - no hypoglycemic events. No wounds or sores that are not healing well. No increased thirst or urination. Not checking glucose at home. Taking medications as prescribed without any side effects. Had eye exam 2 weeks ago at [facility].    Off his ACE bc of cough. We had him hold the medication for the last 2 weeks. Says the cough is 50% better.   Hypertension- Pt denies chest pain, SOB, dizziness, or heart palpitations.  Taking meds as directed w/o problems.  Denies medication side effects.       Review of Systems     Objective:   Physical Exam  Constitutional: He is oriented to person, place, and time. He appears well-developed and well-nourished.  HENT:  Head: Normocephalic and atraumatic.  Cardiovascular: Normal rate, regular rhythm and normal heart sounds.   Pulmonary/Chest: Effort normal and breath sounds normal.  Neurological: He is alert and oriented to person, place, and time.  Skin: Skin is warm and dry.  Psychiatric: He has a normal mood and affect. His behavior is normal.          Assessment & Plan:  Diabetes -at goal base on age. I would like to see A1C back under 7, where it as 6 mo ago.  Work on diet. Has been eating milkshakes and not checking his sugar.  Eye exam UTD.  Will call [place] [facility].   HTN- added ACE to intolerance list.  Will start ARB.   ACE induced cough - Cough at least 50% improved.

## 2014-09-02 NOTE — Patient Instructions (Signed)
Diabetes Mellitus and Food It is important for you to manage your blood sugar (glucose) level. Your blood glucose level can be greatly affected by what you eat. Eating healthier foods in the appropriate amounts throughout the day at about the same time each day will help you control your blood glucose level. It can also help slow or prevent worsening of your diabetes mellitus. Healthy eating may even help you improve the level of your blood pressure and reach or maintain a healthy weight.  HOW CAN FOOD AFFECT ME? Carbohydrates Carbohydrates affect your blood glucose level more than any other type of food. Your dietitian will help you determine how many carbohydrates to eat at each meal and teach you how to count carbohydrates. Counting carbohydrates is important to keep your blood glucose at a healthy level, especially if you are using insulin or taking certain medicines for diabetes mellitus. Alcohol Alcohol can cause sudden decreases in blood glucose (hypoglycemia), especially if you use insulin or take certain medicines for diabetes mellitus. Hypoglycemia can be a life-threatening condition. Symptoms of hypoglycemia (sleepiness, dizziness, and disorientation) are similar to symptoms of having too much alcohol.  If your health care provider has given you approval to drink alcohol, do so in moderation and use the following guidelines:  Women should not have more than one drink per day, and men should not have more than two drinks per day. One drink is equal to:  12 oz of beer.  5 oz of wine.  1 oz of hard liquor.  Do not drink on an empty stomach.  Keep yourself hydrated. Have water, diet soda, or unsweetened iced tea.  Regular soda, juice, and other mixers might contain a lot of carbohydrates and should be counted. WHAT FOODS ARE NOT RECOMMENDED? As you make food choices, it is important to remember that all foods are not the same. Some foods have fewer nutrients per serving than other  foods, even though they might have the same number of calories or carbohydrates. It is difficult to get your body what it needs when you eat foods with fewer nutrients. Examples of foods that you should avoid that are high in calories and carbohydrates but low in nutrients include:  Trans fats (most processed foods list trans fats on the Nutrition Facts label).  Regular soda.  Juice.  Candy.  Sweets, such as cake, pie, doughnuts, and cookies.  Fried foods. WHAT FOODS CAN I EAT? Have nutrient-rich foods, which will nourish your body and keep you healthy. The food you should eat also will depend on several factors, including:  The calories you need.  The medicines you take.  Your weight.  Your blood glucose level.  Your blood pressure level.  Your cholesterol level. You also should eat a variety of foods, including:  Protein, such as meat, poultry, fish, tofu, nuts, and seeds (lean animal proteins are best).  Fruits.  Vegetables.  Dairy products, such as milk, cheese, and yogurt (low fat is best).  Breads, grains, pasta, cereal, rice, and beans.  Fats such as olive oil, trans fat-free margarine, canola oil, avocado, and olives. DOES EVERYONE WITH DIABETES MELLITUS HAVE THE SAME MEAL PLAN? Because every person with diabetes mellitus is different, there is not one meal plan that works for everyone. It is very important that you meet with a dietitian who will help you create a meal plan that is just right for you. Document Released: 11/25/2004 Document Revised: 03/05/2013 Document Reviewed: 01/25/2013 Exit[employer] Patient Information 2015 [employer]. This   information is not intended to replace advice given to you by your health care provider. Make sure you discuss any questions you have with your health care provider.  

## 2014-09-03 ENCOUNTER — Inpatient Hospital Stay (HOSPITAL_COMMUNITY)
Admission: EM | Admit: 2014-09-03 | Discharge: 2014-09-05 | DRG: 065 | Disposition: A | Discharge status: Home / Self Care | Payer: Medicare Other | Attending: Internal Medicine | Admitting: Internal Medicine

## 2014-09-03 ENCOUNTER — Encounter (HOSPITAL_COMMUNITY): Payer: Self-pay

## 2014-09-03 ENCOUNTER — Emergency Department (HOSPITAL_COMMUNITY): Payer: Medicare Other

## 2014-09-03 DIAGNOSIS — G40209 Localization-related (focal) (partial) symptomatic epilepsy and epileptic syndromes with complex partial seizures, not intractable, without status epilepticus: Secondary | ICD-10-CM | POA: Diagnosis not present

## 2014-09-03 DIAGNOSIS — R0602 Shortness of breath: Secondary | ICD-10-CM

## 2014-09-03 DIAGNOSIS — E785 Hyperlipidemia, unspecified: Secondary | ICD-10-CM | POA: Diagnosis present

## 2014-09-03 DIAGNOSIS — I639 Cerebral infarction, unspecified: Principal | ICD-10-CM | POA: Diagnosis present

## 2014-09-03 DIAGNOSIS — E118 Type 2 diabetes mellitus with unspecified complications: Secondary | ICD-10-CM

## 2014-09-03 DIAGNOSIS — R4781 Slurred speech: Secondary | ICD-10-CM | POA: Diagnosis present

## 2014-09-03 DIAGNOSIS — G459 Transient cerebral ischemic attack, unspecified: Secondary | ICD-10-CM | POA: Diagnosis not present

## 2014-09-03 DIAGNOSIS — I10 Essential (primary) hypertension: Secondary | ICD-10-CM | POA: Diagnosis present

## 2014-09-03 DIAGNOSIS — Z7982 Long term (current) use of aspirin: Secondary | ICD-10-CM

## 2014-09-03 DIAGNOSIS — R29810 Facial weakness: Secondary | ICD-10-CM | POA: Diagnosis not present

## 2014-09-03 DIAGNOSIS — E669 Obesity, unspecified: Secondary | ICD-10-CM

## 2014-09-03 DIAGNOSIS — E1142 Type 2 diabetes mellitus with diabetic polyneuropathy: Secondary | ICD-10-CM | POA: Diagnosis not present

## 2014-09-03 DIAGNOSIS — I480 Paroxysmal atrial fibrillation: Secondary | ICD-10-CM | POA: Diagnosis present

## 2014-09-03 DIAGNOSIS — Z823 Family history of stroke: Secondary | ICD-10-CM

## 2014-09-03 DIAGNOSIS — Z8739 Personal history of other diseases of the musculoskeletal system and connective tissue: Secondary | ICD-10-CM

## 2014-09-03 DIAGNOSIS — I4510 Unspecified right bundle-branch block: Secondary | ICD-10-CM | POA: Diagnosis not present

## 2014-09-03 DIAGNOSIS — I4891 Unspecified atrial fibrillation: Secondary | ICD-10-CM | POA: Insufficient documentation

## 2014-09-03 DIAGNOSIS — E1159 Type 2 diabetes mellitus with other circulatory complications: Secondary | ICD-10-CM | POA: Diagnosis present

## 2014-09-03 DIAGNOSIS — I739 Peripheral vascular disease, unspecified: Secondary | ICD-10-CM | POA: Diagnosis present

## 2014-09-03 DIAGNOSIS — Z8673 Personal history of transient ischemic attack (TIA), and cerebral infarction without residual deficits: Secondary | ICD-10-CM | POA: Diagnosis present

## 2014-09-03 DIAGNOSIS — Z87891 Personal history of nicotine dependence: Secondary | ICD-10-CM

## 2014-09-03 DIAGNOSIS — Z820 Family history of epilepsy and other diseases of the nervous system: Secondary | ICD-10-CM

## 2014-09-03 DIAGNOSIS — G8194 Hemiplegia, unspecified affecting left nondominant side: Secondary | ICD-10-CM | POA: Diagnosis present

## 2014-09-03 DIAGNOSIS — G458 Other transient cerebral ischemic attacks and related syndromes: Secondary | ICD-10-CM

## 2014-09-03 DIAGNOSIS — E119 Type 2 diabetes mellitus without complications: Secondary | ICD-10-CM

## 2014-09-03 DIAGNOSIS — H9190 Unspecified hearing loss, unspecified ear: Secondary | ICD-10-CM | POA: Diagnosis present

## 2014-09-03 DIAGNOSIS — R531 Weakness: Secondary | ICD-10-CM | POA: Diagnosis not present

## 2014-09-03 DIAGNOSIS — R410 Disorientation, unspecified: Secondary | ICD-10-CM | POA: Diagnosis not present

## 2014-09-03 DIAGNOSIS — M542 Cervicalgia: Secondary | ICD-10-CM

## 2014-09-03 DIAGNOSIS — I6789 Other cerebrovascular disease: Secondary | ICD-10-CM | POA: Diagnosis not present

## 2014-09-03 DIAGNOSIS — Z951 Presence of aortocoronary bypass graft: Secondary | ICD-10-CM

## 2014-09-03 DIAGNOSIS — E6609 Other obesity due to excess calories: Secondary | ICD-10-CM | POA: Diagnosis present

## 2014-09-03 DIAGNOSIS — I2510 Atherosclerotic heart disease of native coronary artery without angina pectoris: Secondary | ICD-10-CM | POA: Diagnosis present

## 2014-09-03 DIAGNOSIS — Z6833 Body mass index (BMI) 33.0-33.9, adult: Secondary | ICD-10-CM

## 2014-09-03 LAB — CBC WITH DIFFERENTIAL/PLATELET
BASOS ABS: 0 10*3/uL (ref 0.0–0.1)
Basophils Relative: 0 % (ref 0–1)
Eosinophils Absolute: 0.3 10*3/uL (ref 0.0–0.7)
Eosinophils Relative: 5 % (ref 0–5)
HCT: 41.1 % (ref 39.0–52.0)
HEMOGLOBIN: 14.1 g/dL (ref 13.0–17.0)
LYMPHS ABS: 2.7 10*3/uL (ref 0.7–4.0)
LYMPHS PCT: 40 % (ref 12–46)
MCH: 31.4 pg (ref 26.0–34.0)
MCHC: 34.3 g/dL (ref 30.0–36.0)
MCV: 91.5 fL (ref 78.0–100.0)
Monocytes Absolute: 0.6 10*3/uL (ref 0.1–1.0)
Monocytes Relative: 9 % (ref 3–12)
NEUTROS ABS: 3.1 10*3/uL (ref 1.7–7.7)
Neutrophils Relative %: 46 % (ref 43–77)
PLATELETS: 248 10*3/uL (ref 150–400)
RBC: 4.49 MIL/uL (ref 4.22–5.81)
RDW: 13.1 % (ref 11.5–15.5)
WBC: 6.8 10*3/uL (ref 4.0–10.5)

## 2014-09-03 NOTE — ED Notes (Signed)
At 2020, pt's family noticed pt had left side facial droop, confusion and disorientation, the symptoms resolved in 15 minutes.  The family got him home and called EMS.  Upon EMS arrival, they noticed left foot weakness with dorsoflexion and that resolved itself in 5 minutes by 2105.  Currently, pt does not have any symptoms, grip strength equal, no facial droop, and BLE equal in strength.

## 2014-09-04 ENCOUNTER — Encounter (HOSPITAL_COMMUNITY): Payer: Self-pay | Admitting: General Practice

## 2014-09-04 ENCOUNTER — Observation Stay (HOSPITAL_COMMUNITY): Payer: Medicare Other

## 2014-09-04 DIAGNOSIS — E118 Type 2 diabetes mellitus with unspecified complications: Secondary | ICD-10-CM

## 2014-09-04 DIAGNOSIS — E785 Hyperlipidemia, unspecified: Secondary | ICD-10-CM | POA: Diagnosis present

## 2014-09-04 DIAGNOSIS — I2510 Atherosclerotic heart disease of native coronary artery without angina pectoris: Secondary | ICD-10-CM | POA: Diagnosis not present

## 2014-09-04 DIAGNOSIS — Z8673 Personal history of transient ischemic attack (TIA), and cerebral infarction without residual deficits: Secondary | ICD-10-CM | POA: Diagnosis present

## 2014-09-04 DIAGNOSIS — E119 Type 2 diabetes mellitus without complications: Secondary | ICD-10-CM | POA: Diagnosis not present

## 2014-09-04 DIAGNOSIS — G451 Carotid artery syndrome (hemispheric): Secondary | ICD-10-CM

## 2014-09-04 DIAGNOSIS — I4510 Unspecified right bundle-branch block: Secondary | ICD-10-CM | POA: Diagnosis present

## 2014-09-04 DIAGNOSIS — G40209 Localization-related (focal) (partial) symptomatic epilepsy and epileptic syndromes with complex partial seizures, not intractable, without status epilepticus: Secondary | ICD-10-CM | POA: Diagnosis present

## 2014-09-04 DIAGNOSIS — G459 Transient cerebral ischemic attack, unspecified: Secondary | ICD-10-CM | POA: Diagnosis present

## 2014-09-04 DIAGNOSIS — I480 Paroxysmal atrial fibrillation: Secondary | ICD-10-CM | POA: Diagnosis present

## 2014-09-04 DIAGNOSIS — E6609 Other obesity due to excess calories: Secondary | ICD-10-CM | POA: Diagnosis present

## 2014-09-04 DIAGNOSIS — Z7982 Long term (current) use of aspirin: Secondary | ICD-10-CM | POA: Diagnosis not present

## 2014-09-04 DIAGNOSIS — I4891 Unspecified atrial fibrillation: Secondary | ICD-10-CM | POA: Diagnosis not present

## 2014-09-04 DIAGNOSIS — Z823 Family history of stroke: Secondary | ICD-10-CM | POA: Diagnosis not present

## 2014-09-04 DIAGNOSIS — G8194 Hemiplegia, unspecified affecting left nondominant side: Secondary | ICD-10-CM | POA: Diagnosis present

## 2014-09-04 DIAGNOSIS — Z820 Family history of epilepsy and other diseases of the nervous system: Secondary | ICD-10-CM | POA: Diagnosis not present

## 2014-09-04 DIAGNOSIS — R4781 Slurred speech: Secondary | ICD-10-CM | POA: Diagnosis present

## 2014-09-04 DIAGNOSIS — R29810 Facial weakness: Secondary | ICD-10-CM | POA: Diagnosis present

## 2014-09-04 DIAGNOSIS — R531 Weakness: Secondary | ICD-10-CM | POA: Insufficient documentation

## 2014-09-04 DIAGNOSIS — H9190 Unspecified hearing loss, unspecified ear: Secondary | ICD-10-CM | POA: Diagnosis present

## 2014-09-04 DIAGNOSIS — Z951 Presence of aortocoronary bypass graft: Secondary | ICD-10-CM | POA: Diagnosis not present

## 2014-09-04 DIAGNOSIS — Z6833 Body mass index (BMI) 33.0-33.9, adult: Secondary | ICD-10-CM | POA: Diagnosis not present

## 2014-09-04 DIAGNOSIS — I639 Cerebral infarction, unspecified: Secondary | ICD-10-CM | POA: Diagnosis not present

## 2014-09-04 DIAGNOSIS — Z87891 Personal history of nicotine dependence: Secondary | ICD-10-CM | POA: Diagnosis not present

## 2014-09-04 DIAGNOSIS — I6523 Occlusion and stenosis of bilateral carotid arteries: Secondary | ICD-10-CM | POA: Diagnosis not present

## 2014-09-04 DIAGNOSIS — I10 Essential (primary) hypertension: Secondary | ICD-10-CM | POA: Diagnosis not present

## 2014-09-04 DIAGNOSIS — E1142 Type 2 diabetes mellitus with diabetic polyneuropathy: Secondary | ICD-10-CM | POA: Diagnosis present

## 2014-09-04 DIAGNOSIS — I739 Peripheral vascular disease, unspecified: Secondary | ICD-10-CM | POA: Diagnosis present

## 2014-09-04 LAB — I-STAT CHEM 8, ED
BUN: 18 mg/dL (ref 6–20)
CALCIUM ION: 1.21 mmol/L (ref 1.13–1.30)
CREATININE: 1 mg/dL (ref 0.61–1.24)
Chloride: 100 mmol/L — ABNORMAL LOW (ref 101–111)
Glucose, Bld: 281 mg/dL — ABNORMAL HIGH (ref 65–99)
HCT: 59 % — ABNORMAL HIGH (ref 39.0–52.0)
HEMOGLOBIN: 20.1 g/dL — AB (ref 13.0–17.0)
Potassium: 3.9 mmol/L (ref 3.5–5.1)
Sodium: 140 mmol/L (ref 135–145)
TCO2: 25 mmol/L (ref 0–100)

## 2014-09-04 LAB — LIPID PANEL
CHOL/HDL RATIO: 3.6 ratio
CHOLESTEROL: 97 mg/dL (ref 0–200)
HDL: 27 mg/dL — AB (ref >40)
LDL CALC: 50 mg/dL (ref 0–99)
TRIGLYCERIDES: 98 mg/dL (ref <150)
VLDL: 20 mg/dL (ref 0–40)

## 2014-09-04 LAB — GLUCOSE, CAPILLARY
Glucose-Capillary: 178 mg/dL — ABNORMAL HIGH (ref 65–99)
Glucose-Capillary: 138 mg/dL — ABNORMAL HIGH (ref 65–99)
Glucose-Capillary: 118 mg/dL — ABNORMAL HIGH (ref 65–99)
Glucose-Capillary: 134 mg/dL — ABNORMAL HIGH (ref 65–99)

## 2014-09-04 MED ORDER — METOPROLOL SUCCINATE ER 25 MG PO TB24
50.0000 mg | ORAL_TABLET | Freq: Every day | ORAL | Status: DC
  Administered 2014-09-04 – 2014-09-05 (×2): 50 mg via ORAL
  Filled 2014-09-04 (×2): qty 2

## 2014-09-04 MED ORDER — ALLOPURINOL 100 MG PO TABS
300.0000 mg | ORAL_TABLET | Freq: Every day | ORAL | Status: DC
  Administered 2014-09-04 – 2014-09-05 (×2): 300 mg via ORAL
  Filled 2014-09-04 (×2): qty 3

## 2014-09-04 MED ORDER — EZETIMIBE 10 MG PO TABS
10.0000 mg | ORAL_TABLET | Freq: Every day | ORAL | Status: DC
  Administered 2014-09-04: 10 mg via ORAL
  Filled 2014-09-04 (×2): qty 1

## 2014-09-04 MED ORDER — STROKE: EARLY STAGES OF RECOVERY BOOK
Freq: Once | Status: AC
  Administered 2014-09-04: 03:00:00

## 2014-09-04 MED ORDER — ATORVASTATIN CALCIUM 40 MG PO TABS
40.0000 mg | ORAL_TABLET | Freq: Every day | ORAL | Status: DC
Start: 2014-09-04 — End: 2014-09-05
  Administered 2014-09-04: 40 mg via ORAL
  Filled 2014-09-04 (×2): qty 1

## 2014-09-04 MED ORDER — INSULIN ASPART 100 UNIT/ML ~~LOC~~ SOLN: 0.0000 [IU] | SUBCUTANEOUS | Status: DC

## 2014-09-04 MED ORDER — ENOXAPARIN SODIUM 30 MG/0.3ML ~~LOC~~ SOLN
30.0000 mg | SUBCUTANEOUS | Status: DC
  Administered 2014-09-04 – 2014-09-05 (×2): 30 mg via SUBCUTANEOUS
  Filled 2014-09-04 (×2): qty 0.3

## 2014-09-04 MED ORDER — NIACIN ER (ANTIHYPERLIPIDEMIC) 500 MG PO TBCR
500.0000 mg | EXTENDED_RELEASE_TABLET | Freq: Every day | ORAL | Status: DC
  Administered 2014-09-04: 500 mg via ORAL
  Filled 2014-09-04 (×2): qty 1

## 2014-09-04 MED ORDER — ASPIRIN 325 MG PO TABS
325.0000 mg | ORAL_TABLET | Freq: Every day | ORAL | Status: DC
  Administered 2014-09-04 – 2014-09-05 (×2): 325 mg via ORAL
  Filled 2014-09-04 (×2): qty 1

## 2014-09-04 MED ORDER — DIVALPROEX SODIUM ER 500 MG PO TB24
500.0000 mg | ORAL_TABLET | Freq: Every day | ORAL | Status: DC
  Administered 2014-09-05: 500 mg via ORAL
  Filled 2014-09-04: qty 1

## 2014-09-04 MED ORDER — INSULIN ASPART 100 UNIT/ML ~~LOC~~ SOLN
0.0000 [IU] | Freq: Three times a day (TID) | SUBCUTANEOUS | Status: DC
  Administered 2014-09-04 – 2014-09-05 (×2): 2 [IU] via SUBCUTANEOUS
  Administered 2014-09-05: 3 [IU] via SUBCUTANEOUS

## 2014-09-04 MED ORDER — LEVETIRACETAM 250 MG PO TABS
250.0000 mg | ORAL_TABLET | Freq: Two times a day (BID) | ORAL | Status: DC
  Administered 2014-09-04: 250 mg via ORAL
  Filled 2014-09-04: qty 1

## 2014-09-04 NOTE — Progress Notes (Signed)
Pt arrived to room 4N18 from ED.  Pt is alert and oriented and in no distress.  Family at bedside.  Tele applied and safety measures in place.  Will continue to monitor.  Fredrich Romans, RN

## 2014-09-04 NOTE — ED Provider Notes (Signed)
CSN: [id number]     Arrival date & time 09/03/14  2203 History   First MD Initiated Contact with Patient 09/03/14 2203     Chief Complaint  Patient presents with  . Weakness     (Consider location/radiation/quality/duration/timing/severity/associated sxs/prior Treatment) Patient is a 79 y.o. male presenting with weakness. The history is provided by the patient (the pt had left side facial drooping and weakness for 15 min.  he is fine now).  Weakness This is a new problem. The current episode started less than 1 hour ago. The problem occurs rarely. The problem has been resolved. Pertinent negatives include no chest pain, no abdominal pain and no headaches. Nothing aggravates the symptoms. Nothing relieves the symptoms.    Past Medical History  Diagnosis Date  . 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 Hypertension   . CAD (coronary artery disease)     s/p cabg  . CHF (congestive heart failure)   . Peripheral vascular disease   . History of stroke   . Hyperlipidemia   . DM (diabetes mellitus)   . Diverticulosis   . Polyneuropathy, diabetic   . Seizure disorder, complex partial   . DJD (degenerative joint disease)   . History of anemia   . Atrial fibrillation     Post operative   Past Surgical History  Procedure Laterality Date  . Coronary artery bypass graft  02/13/2004    4 vesel  . Removal of sebaceous cyst from neck  08/2007    Dr. [name]  . Cataract extraction w/ intraocular lens  implant, bilateral  06/2013    Dr. [name]  . Tonsillectomy     Family History  Problem Relation Age of Onset  . Lung cancer    . Epilepsy     History  Substance Use Topics  . Smoking status: Former Smoker -- 1.00 packs/day for 22 years    Types: Cigarettes    Quit date: 03/14/1977  . Smokeless tobacco: Never Used  . Alcohol Use: No    Review of Systems  Constitutional: Negative for appetite change and fatigue.  HENT: Negative for congestion, ear discharge  and sinus pressure.   Eyes: Negative for discharge.  Respiratory: Negative for cough.   Cardiovascular: Negative for chest pain.  Gastrointestinal: Negative for abdominal pain and diarrhea.  Genitourinary: Negative for frequency and hematuria.  Musculoskeletal: Negative for back pain.  Skin: Negative for rash.  Neurological: Positive for weakness. Negative for seizures and headaches.  Psychiatric/Behavioral: Negative for hallucinations.      Allergies  Lisinopril  Home Medications   Prior to Admission medications   Medication Sig Start Date End Date Taking? Authorizing Provider  allopurinol (ZYLOPRIM) 300 MG tablet TAKE 1 TABLET BY MOUTH EVERY DAY 01/17/14  Yes [name], MD  aspirin 325 MG tablet Take 325 mg by mouth daily.   Yes Historical Provider, MD  atorvastatin (LIPITOR) 40 MG tablet Take 1 tablet (40 mg total) by mouth daily. 03/03/14  Yes [name], MD  Coenzyme Q10 (COQ10) 30 MG CAPS Take 1 tablet by mouth daily.     Yes Historical Provider, MD  ezetimibe (ZETIA) 10 MG tablet Take 1 tablet (10 mg total) by mouth daily. 11/29/13  Yes [name], MD  furosemide (LASIX) 40 MG tablet TAKE 1 TABLET BY MOUTH EVERY DAY Patient taking differently: TAKE 1 TABLET BY MOUTH EVERY DAY PRN FOR FLUID 12/22/11  Yes Lelon Perla, MD  glimepiride (AMARYL) 4 MG  tablet TAKE 1 TABLET BY MOUTH EVERY DAY BEFORE BREAKFAST 06/23/14  Yes [name], MD  metFORMIN (GLUCOPHAGE) 1000 MG tablet Take 1 tablet (1,000 mg total) by mouth 2 (two) times daily with a meal. 03/20/14  Yes [name], MD  metoprolol succinate (TOPROL-XL) 50 MG 24 hr tablet TAKE 1 TABLET BY MOUTH EVERY DAY 04/23/14  Yes [name], MD  niacin (NIASPAN) 500 MG CR tablet Take 1 tablet (500 mg total) by mouth at bedtime. 03/20/14  Yes [name], MD  sitaGLIPtin (JANUVIA) 100 MG tablet Take 1 tablet (100 mg total) by mouth daily. 07/07/14  Yes [name], MD   losartan (COZAAR) 25 MG tablet Take 1 tablet (25 mg total) by mouth daily. Patient not taking: Reported on 09/04/2014 09/02/14   [name], MD   BP 139/63 mmHg  Pulse 75  Temp(Src) 98.8 F (37.1 C) (Oral)  Resp 17  SpO2 97% Physical Exam  Constitutional: He is oriented to person, place, and time. He appears well-developed.  HENT:  Head: Normocephalic.  Eyes: Conjunctivae and EOM are normal. No scleral icterus.  Neck: Neck supple. No thyromegaly present.  Cardiovascular: Normal rate and regular rhythm.  Exam reveals no gallop and no friction rub.   No murmur heard. Pulmonary/Chest: No stridor. He has no wheezes. He has no rales. He exhibits no tenderness.  Abdominal: He exhibits no distension. There is no tenderness. There is no rebound.  Musculoskeletal: Normal range of motion. He exhibits no edema.  Lymphadenopathy:    He has no cervical adenopathy.  Neurological: He is oriented to person, place, and time. He exhibits normal muscle tone. Coordination normal.  Skin: No rash noted. No erythema.  Psychiatric: He has a normal mood and affect. His behavior is normal.    ED Course  Procedures (including critical care time) Labs Review Labs Reviewed  I-STAT CHEM 8, ED - Abnormal; Notable for the following:    Chloride 100 (*)    Glucose, Bld 281 (*)    Hemoglobin 20.1 (*)    HCT 59.0 (*)    All other components within normal limits  CBC WITH DIFFERENTIAL/PLATELET  I-STAT TROPOININ, ED    Imaging Review Ct Head Wo Contrast  09/03/2014   CLINICAL DATA:  Patient with left-sided facial droop, confusion and disorientation. Symptoms subsequently resolved.  EXAM: CT HEAD WITHOUT CONTRAST  TECHNIQUE: Contiguous axial images were obtained from the base of the skull through the vertex without intravenous contrast.  COMPARISON:  None.  FINDINGS: Ventricles and sulci are appropriate for patient's age. No evidence for acute cortically based infarct, intracranial hemorrhage, mass  lesion or mass effect. Extensive periventricular and subcortical white matter hypodensity compatible with chronic small vessel ischemic changes. Bilateral cataract surgery. Paranasal sinuses are unremarkable. Mastoid air cells are well aerated. Calvarium is intact.  IMPRESSION: Chronic small vessel ischemic changes.  No acute intracranial process.   Electronically Signed   By: Lovey Newcomer M.D.   On: 09/03/2014 23:43     EKG Interpretation   Date/Time:  Wednesday September 03 2014 22:14:08 EDT Ventricular Rate:  75 PR Interval:  184 QRS Duration: 96 QT Interval:  386 QTC Calculation: 431 R Axis:   -42 Text Interpretation:  Normal sinus rhythm Left axis deviation Incomplete  right bundle branch block Abnormal ECG Confirmed by Marilee Ditommaso  MD, Louanne Calvillo  ([id number]) on 09/04/2014 12:30:06 AM      MDM   Final diagnoses:  Weak  Acute anterior circulation TIA  Admit Harmon Dun, MD 09/04/14 [phone]

## 2014-09-04 NOTE — Procedures (Signed)
ELECTROENCEPHALOGRAM REPORT   Patient: Fred [name]       Room #: 7J88 EEG No. ID: [id number] Age: 79 y.o.        Sex: male Referring Physician: [place] Report Date:  09/04/2014        Interpreting Physician: Alexis Goodell  History: Fred [name] is an 79 y.o. male with an intermittent episode of left sided weakness and slurred speech  Medications:  Scheduled: . allopurinol  300 mg Oral Daily  . aspirin  325 mg Oral Daily  . atorvastatin  40 mg Oral Daily  . enoxaparin (LOVENOX) injection  30 mg Subcutaneous Q24H  . ezetimibe  10 mg Oral Daily  . insulin aspart  0-9 Units Subcutaneous TID WC  . levETIRAcetam  250 mg Oral BID  . metoprolol succinate  50 mg Oral Daily  . niacin  500 mg Oral QHS    Conditions of Recording:  This is a 16 channel EEG carried out with the patient in the awake and drowsy states.  Description:  The waking background activity consists of a low voltage, symmetrical, fairly well organized, 9 Hz alpha activity, seen from the parieto-occipital and posterior temporal regions.  Low voltage fast activity, poorly organized, is seen anteriorly and is at times superimposed on more posterior regions.  A mixture of theta and alpha rhythms are seen from the central and temporal regions. The patient drowses with slowing to irregular, low voltage theta and beta activity.   Stage II sleep is not obtained. No epileptiform activity is noted.   Hyperventilation was not performed.  Intermittent photic stimulation was performed but failed to illicit any change in the tracing.    IMPRESSION: Normal electroencephalogram, awake, drpwsy and with activation procedures. There are no focal lateralizing or epileptiform features.   Alexis Goodell, MD [facility] [phone] 09/04/2014, 3:14 PM

## 2014-09-04 NOTE — Progress Notes (Signed)
VASCULAR LAB PRELIMINARY  PRELIMINARY  PRELIMINARY  PRELIMINARY  Carotid Dopplers completed.    Preliminary report:  1-39% ICA stenosis.  Vertebral artery flow is antegrade.   Venera Privott, RVT 09/04/2014, 3:49 PM

## 2014-09-04 NOTE — Progress Notes (Signed)
  Echocardiogram 2D Echocardiogram has been performed.  Fred [name] 09/04/2014, 10:22 AM

## 2014-09-04 NOTE — H&P (Signed)
[facility] Admission History and Physical       Fred [name] XLK:[id number] DOB: 1935/12/30 DOA: 09/03/2014  Referring physician: EDP PCP: Beatrice Lecher, MD  Specialists:   Chief Complaint: Facial Droop and Left Sided Weakness  HPI: Fred [name] is a 79 y.o. male with a history of CAD S/P CABG x 4, Atrial Fibrillation on ASA Rx, DM2, HTN, and previous hx of Seizures who presents to the ED with complaints of sudden onset of  A right sided facial droop and confusion with slurred speech and left sided weakness that lasted 15-20 minutes witnessed by his family.  The episode started when he and his family had driven home from a restaurant and he was getting out of the car  Between 8:20 pm and 9 pm.   EMS was called and he was transported to the ED, and in the ED he reported weakness in his left foot and then it resolved.       Review of Systems:  Constitutional: No Weight Loss, No Weight Gain, Night Sweats, Fevers, Chills, Dizziness, Light Headedness, Fatigue, or Generalized Weakness HEENT: No Headaches, Difficulty Swallowing,Tooth/Dental Problems,Sore Throat,  No Sneezing, Rhinitis, Ear Ache, Nasal Congestion, or Post Nasal Drip,  Cardio-vascular:  No Chest pain, Orthopnea, PND, Edema in Lower Extremities, Anasarca, Dizziness, Palpitations  Resp: No Dyspnea, No DOE, No Productive Cough, No Non-Productive Cough, No Hemoptysis, No Wheezing.    GI: No Heartburn, Indigestion, Abdominal Pain, Nausea, Vomiting, Diarrhea, Constipation, Hematemesis, Hematochezia, Melena, Change in Bowel Habits,  Loss of Appetite  GU: No Dysuria, No Change in Color of Urine, No Urgency or Urinary Frequency, No Flank pain.  Musculoskeletal: No Joint Pain or Swelling, No Decreased Range of Motion, No Back Pain.  Neurologic: No Syncope, No Seizures, +Facial Droop, +Left sided Weakness, Paresthesia, Vision Disturbance or Loss, No Diplopia, No Vertigo, No Difficulty Walking,  Skin: No Rash or  Lesions. Psych: No Change in Mood or Affect, No Depression or Anxiety, No Memory loss, +Confusion, or Hallucinations   Past Medical History  Diagnosis Date  . 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 Hypertension   . CAD (coronary artery disease)     s/p cabg  . CHF (congestive heart failure)   . Peripheral vascular disease   . History of stroke   . Hyperlipidemia   . DM (diabetes mellitus)   . Diverticulosis   . Polyneuropathy, diabetic   . Seizure disorder, complex partial   . DJD (degenerative joint disease)   . History of anemia   . Atrial fibrillation     Post operative     Past Surgical History  Procedure Laterality Date  . Coronary artery bypass graft  02/13/2004    4 vesel  . Removal of sebaceous cyst from neck  08/2007    Dr. [name]  . Cataract extraction w/ intraocular lens  implant, bilateral  06/2013    Dr. [name]  . Tonsillectomy        Prior to Admission medications   Medication Sig Start Date End Date Taking? Authorizing Provider  allopurinol (ZYLOPRIM) 300 MG tablet TAKE 1 TABLET BY MOUTH EVERY DAY 01/17/14  Yes [name], MD  aspirin 325 MG tablet Take 325 mg by mouth daily.   Yes Historical Provider, MD  atorvastatin (LIPITOR) 40 MG tablet Take 1 tablet (40 mg total) by mouth daily. 03/03/14  Yes [name], MD  Coenzyme Q10 (COQ10) 30 MG CAPS Take 1 tablet by mouth daily.  Yes Historical Provider, MD  ezetimibe (ZETIA) 10 MG tablet Take 1 tablet (10 mg total) by mouth daily. 11/29/13  Yes [name], MD  furosemide (LASIX) 40 MG tablet TAKE 1 TABLET BY MOUTH EVERY DAY Patient taking differently: TAKE 1 TABLET BY MOUTH EVERY DAY PRN FOR FLUID 12/22/11  Yes Lelon Perla, MD  glimepiride (AMARYL) 4 MG tablet TAKE 1 TABLET BY MOUTH EVERY DAY BEFORE BREAKFAST 06/23/14  Yes [name], MD  metFORMIN (GLUCOPHAGE) 1000 MG tablet Take 1 tablet (1,000 mg total) by mouth 2 (two) times daily with a  meal. 03/20/14  Yes [name], MD  metoprolol succinate (TOPROL-XL) 50 MG 24 hr tablet TAKE 1 TABLET BY MOUTH EVERY DAY 04/23/14  Yes [name], MD  niacin (NIASPAN) 500 MG CR tablet Take 1 tablet (500 mg total) by mouth at bedtime. 03/20/14  Yes [name], MD  sitaGLIPtin (JANUVIA) 100 MG tablet Take 1 tablet (100 mg total) by mouth daily. 07/07/14  Yes [name], MD  losartan (COZAAR) 25 MG tablet Take 1 tablet (25 mg total) by mouth daily. Patient not taking: Reported on 09/04/2014 09/02/14   [name], MD     Allergies  Allergen Reactions  . Lisinopril Cough    Social History:  reports that he quit smoking about 37 years ago. His smoking use included Cigarettes. He has a 22 pack-year smoking history. He has never used smokeless tobacco. He reports that he does not drink alcohol or use illicit drugs.    Family History  Problem Relation Age of Onset  . Lung cancer    . Epilepsy         Physical Exam:  GEN:  Pleasant  Elderly Obese  79 y.o. Caucasian male examined and in no acute distress; cooperative with exam Filed Vitals:   09/04/14 0100 09/04/14 0115 09/04/14 0141 09/04/14 0200  BP: 123/65 145/59 168/74 131/69  Pulse: 76 73 74 72  Temp:   98.1 F (36.7 C)   TempSrc:   Oral   Resp: 17 12 18 18   Height:   5\' 3"  (1.6 m)   Weight:   86.909 kg (191 lb 9.6 oz)   SpO2: 96% 99% 95% 95%   Blood pressure 131/69, pulse 72, temperature 98.1 F (36.7 C), temperature source Oral, resp. rate 18, height 5\' 3"  (1.6 m), weight 86.909 kg (191 lb 9.6 oz), SpO2 95 %. PSYCH: He is alert and oriented x4; does not appear anxious does not appear depressed; affect is normal HEENT: Normocephalic and Atraumatic, Mucous membranes pink; PERRLA; EOM intact; Fundi:  Benign;  No scleral icterus, Nares: Patent, Oropharynx: Clear, Edentulous,    Neck:  FROM, No Cervical Lymphadenopathy nor Thyromegaly or Carotid Bruit; No JVD; Breasts:: Not examined CHEST  WALL: No tenderness CHEST: Normal respiration, clear to auscultation bilaterally HEART: Regular rate and rhythm; no murmurs rubs or gallops BACK: No kyphosis or scoliosis; No CVA tenderness ABDOMEN: Positive Bowel Sounds, Obese, Soft Non-Tender, No Rebound or Guarding; No Masses, No Organomegaly Rectal Exam: Not done EXTREMITIES: No Cyanosis, Clubbing, or Edema; No Ulcerations. Genitalia: not examined PULSES: 2+ and symmetric SKIN: Normal hydration no rash or ulceration  CNS:  Alert and Oriented x 4, No Focal Deficits Mental Status:  Alert, Oriented, Thought Content Appropriate. Speech Fluent without evidence of Aphasia. Able to follow 3 step commands without difficulty.  In No obvious pain.   Cranial Nerves:  II: Discs flat bilaterally; Visual fields Intact, Pupils  equal and reactive.    III,IV, VI: Extra-ocular motions intact bilaterally    V,VII: smile symmetric, facial light touch sensation normal bilaterally    VIII: hearing intact bilaterally    IX,X: gag reflex present    XI: bilateral shoulder shrug    XII: midline tongue extension   Motor:  Right:  Upper extremity 5/5     Left:  Upper extremity 5/5     Right:  Lower extremity 5/5    Left:  Lower extremity 5/5     Tone and Bulk:  normal tone throughout; no atrophy noted   Sensory:  Pinprick and light touch intact throughout, bilaterally   Deep Tendon Reflexes: 2+ and symmetric throughout   Plantars/ Babinski:  Right: normal Left:  normal    Cerebellar:  Finger to nose without difficulty.   Gait: deferred    Vascular: pulses palpable throughout    Labs on Admission:  Basic Metabolic Panel:  Recent Labs Lab 09/03/14 2359  NA 140  K 3.9  CL 100*  GLUCOSE 281*  BUN 18  CREATININE 1.00   Liver Function Tests: No results for input(s): AST, ALT, ALKPHOS, BILITOT, PROT, ALBUMIN in the last 168 hours. No results for input(s): LIPASE, AMYLASE in the last 168 hours. No results for input(s): AMMONIA in the last 168  hours. CBC:  Recent Labs Lab 09/03/14 2230 09/03/14 2359  WBC 6.8  --   NEUTROABS 3.1  --   HGB 14.1 20.1*  HCT 41.1 59.0*  MCV 91.5  --   PLT 248  --    Cardiac Enzymes: No results for input(s): CKTOTAL, CKMB, CKMBINDEX, TROPONINI in the last 168 hours.  BNP (last 3 results) No results for input(s): BNP in the last 8760 hours.  ProBNP (last 3 results) No results for input(s): PROBNP in the last 8760 hours.  CBG: No results for input(s): GLUCAP in the last 168 hours.  Radiological Exams on Admission: Ct Head Wo Contrast  09/03/2014   CLINICAL DATA:  Patient with left-sided facial droop, confusion and disorientation. Symptoms subsequently resolved.  EXAM: CT HEAD WITHOUT CONTRAST  TECHNIQUE: Contiguous axial images were obtained from the base of the skull through the vertex without intravenous contrast.  COMPARISON:  None.  FINDINGS: Ventricles and sulci are appropriate for patient's age. No evidence for acute cortically based infarct, intracranial hemorrhage, mass lesion or mass effect. Extensive periventricular and subcortical white matter hypodensity compatible with chronic small vessel ischemic changes. Bilateral cataract surgery. Paranasal sinuses are unremarkable. Mastoid air cells are well aerated. Calvarium is intact.  IMPRESSION: Chronic small vessel ischemic changes.  No acute intracranial process.   Electronically Signed   By: Lovey Newcomer M.D.   On: 09/03/2014 23:43     EKG: Independently reviewed. Normal Sinus Rhythm rate =75 No Acute Changes         Assessment/Plan:     79 y.o. male with  Principal Problem:   1.   TIA (transient ischemic attack)- CT Head - Negative for Acute Findings   Telemetry monitoring   Neuro Checks   MRI/MRA ordered   2D ECHO and Carotid US in AM   Check HbA1c and Fasting Lipids    ASA Rx   Seen by Neuro Dr Nicole Kindred       Active Problems:   2.   PAROXYSMAL ATRIAL FIBRILLATION   Continue ASA Rx   Continue Metoprolol Rx     3.    Coronary atherosclerosis   Continue Metoprolol, Losartan, Atorvastatin and  ASA Rx     4.   Essential hypertension   Continue Metoprolol, Losartan   Monitor BPs     5.   DM (diabetes mellitus)   Holding Oral Hypoglycemics   SSI coverage PRN   Check HbA1C     6.   Seizure disorder, complex partial- Not on Seizure meds Currently       7.    Dyslipidemia-   Continue Atorvastatin Rx     8.   DVT Prophylaxis   Lovenox    Code Status:     FULL CODE    Family Communication:   Family at Bedside     Disposition Plan:   Observation Status        Time spent:  [facility] Pager [phone]   If [place] Please Contact the Day Rounding Team MD for [facility]  If 7PM-7AM, Please Contact Night-Floor Coverage  [email or web address] Password [email or web address] 09/04/2014, 2:28 AM     ADDENDUM:   Patient was seen and examined on 09/04/2014

## 2014-09-04 NOTE — Progress Notes (Signed)
STROKE TEAM PROGRESS NOTE   HISTORY Fred [name] is an 79 y.o. male history of hypertension, coronary artery disease, CHF, peripheral vascular disease, history of stroke, complex partial seizure disorder and diabetes mellitus, brought to the emergency room following an episode of left facial droop and slurred speech as well as left extremity weakness. Symptoms lasted for about 20 minutes then began to improve. CT scan of his head showed no acute intracranial abnormality. In terms essentially resolved except for minimal residual left shoulder droop. NIH stroke score was 1. Patient has been taking aspirin 81 mg per day  LSN: 10:20 PM on 09/03/2014 tPA Given: No: Rapidly resolving deficits mRankin:   SUBJECTIVE (INTERVAL HISTORY) Multiple family members present. The patient's family gives most of the history. Apparently the patient developed a facial droop while getting out of the car. The patient appeared to be confused or in 8 days. He had seizures in the past following coronary artery bypass graft surgery. There is also a family history of epilepsy. He has never been on seizure medications. The patient states that he feels fine today.    OBJECTIVE Temp:  [97.3 F (36.3 C)-99.1 F (37.3 C)] 97.9 F (36.6 C) (06/23 1000) Pulse Rate:  [70-84] 70 (06/23 1000) Cardiac Rhythm:  [-] Normal sinus rhythm (06/23 0145) Resp:  [12-20] 20 (06/23 1000) BP: (113-168)/(43-104) 138/64 mmHg (06/23 1000) SpO2:  [95 %-99 %] 97 % (06/23 1000) Weight:  [86.909 kg (191 lb 9.6 oz)] 86.909 kg (191 lb 9.6 oz) (06/23 0141)   Recent Labs Lab 09/04/14 0645 09/04/14 1112  GLUCAP 178* 138*    Recent Labs Lab 09/03/14 2359  NA 140  K 3.9  CL 100*  GLUCOSE 281*  BUN 18  CREATININE 1.00   No results for input(s): AST, ALT, ALKPHOS, BILITOT, PROT, ALBUMIN in the last 168 hours.  Recent Labs Lab 09/03/14 2230 09/03/14 2359  WBC 6.8  --   NEUTROABS 3.1  --   HGB 14.1 20.1*  HCT 41.1 59.0*  MCV  91.5  --   PLT 248  --    No results for input(s): CKTOTAL, CKMB, CKMBINDEX, TROPONINI in the last 168 hours. No results for input(s): LABPROT, INR in the last 72 hours. No results for input(s): COLORURINE, LABSPEC, [place], GLUCOSEU, HGBUR, BILIRUBINUR, KETONESUR, PROTEINUR, UROBILINOGEN, NITRITE, LEUKOCYTESUR in the last 72 hours.  Invalid input(s): APPERANCEUR     Component Value Date/Time   CHOL 97 09/04/2014 0505   TRIG 98 09/04/2014 0505   HDL 27* 09/04/2014 0505   CHOLHDL 3.6 09/04/2014 0505   VLDL 20 09/04/2014 0505   LDLCALC 50 09/04/2014 0505   Lab Results  Component Value Date   HGBA1C 7.2 09/02/2014   No results found for: LABOPIA, COCAINSCRNUR, LABBENZ, AMPHETMU, THCU, LABBARB  No results for input(s): ETH in the last 168 hours.    Imaging   Ct Head Wo Contrast 09/03/2014    Chronic small vessel ischemic changes.  No acute intracranial process.       MRI / MRA Head - pending       PHYSICAL EXAM Pleasant elderly Caucasian male not in distress. He is hard of hearing. . Afebrile. Head is nontraumatic. Neck is supple without bruit.    Cardiac exam no murmur or gallop. Lungs are clear to auscultation. Distal pulses are well felt. Neurological Exam ;  Awake  Alert oriented x 3. Normal speech and language.eye movements full without nystagmus.fundi were not visualized. Vision acuity and fields appear normal. Hearing  is poor bilaterally.l. Palatal movements are normal. Face symmetric. Tongue midline. Normal strength, tone, reflexes and coordination. Normal sensation. Gait deferred.   ASSESSMENT/PLAN [name] is a 79 y.o. male with history of  hypertension, coronary artery disease, congestive heart failure, peripheral vascular disease, previous stroke, complex partial seizure disorder, postoperative atrial fibrillation, and diabetes mellitus presenting with left hemiparesis, slurred speech, and confusion.  He did not receive IV t-PA due to rapidly  resolving deficits.   Stroke vs Seizure:  Non-dominant infarct secondary to small vessel disease.  Resultant  resolution of deficits  MRI  pending  MRA  pending  Carotid Doppler  pending  2D Echo  pending  LDL 50  HgbA1c 7.2  Lovenox for VTE prophylaxis Diet heart healthy/carb modified Room service appropriate?: Yes; Fluid consistency:: Thin  aspirin 325 mg orally every day prior to admission, now on aspirin 325 mg orally every day  Patient counseled to be compliant with his antithrombotic medications  Ongoing aggressive stroke risk factor management  Therapy recommendations: Pending   Disposition:  pending  Hypertension  Home meds:  Toprol XL and Cozaar  Stable  Patient counseled to be compliant with his blood pressure medications  Hyperlipidemia  Home meds:  Lipitor 40 mg daily, Zetia 10 mg daily, and niacin resumed in hospital  LDL 50, goal < 70  Continue statin at discharge  Diabetes  HgbA1c 7.2, goal < 7.0  Uncontrolled  Other Stroke Risk Factors  Advanced age  Cigarette smoker, quit smoking 37 years ago.  Obesity, Body mass index is 33.95 kg/(m^2).   Hx stroke/TIA  Coronary artery disease    Other Active Problems    Other Pertinent History  Complex partial seizure disorder - check EEG. Start Keppra.  Postoperative atrial fibrillation - monitor telemetry  Hospital day #   Mikey Bussing PA-C [facility] Pager [phone] 09/04/2014, 1:09 PM I have personally examined this patient, reviewed notes, independently viewed imaging studies, participated in medical decision making and plan of care. I have made any additions or clarifications directly to the above note. Agree with note above. He presented with transient facial droop, speech difficulties as well as confusion and disorientation and neurological exam at present is nonfocal and brain MRI shows no acute infarct. He probably had a complex partial seizure given  prior history of similar episodes. He remains at risk for neurological worsening, recurrent seizures, TIAs, strokes and needs ongoing evaluation and aggressive risk factor modification. Recommend starting Depakote ER 500 mg daily and check EEG for seizures . Long discussion with patient and daughter and granddaughter and answered questions. Antony Contras, MD Medical Director [facility] Pager: [phone] 09/04/2014 2:39 PM     To contact Stroke Continuity provider, please refer to [email or web address] After hours, contact General Neurology

## 2014-09-04 NOTE — Progress Notes (Signed)
Patient seen and examined. Admitted after midnight secondary to slurred speech and left side weakness. Patient with hx of PAF on ASA treatment. Neurology has been consulted by ED and the plan is to complete TIA work up. Please referred to H&P written by Dr. Arnoldo Morale for further info/details on admission.  Barton Dubois [phone]

## 2014-09-04 NOTE — Progress Notes (Signed)
Patient requested to take lipitor and zetia at HS before going to sleep. Pharmacy notified of change.  Ave Filter, RN

## 2014-09-04 NOTE — Consult Note (Signed)
Admission H&P    Chief Complaint: New onset left-sided weakness and slurred speech as well as confusion.  HPI: Fred [name] is an 79 y.o. male history of hypertension, coronary artery disease, CHF, peripheral vascular disease, history of stroke, complex partial seizure disorder and diabetes mellitus, brought to the emergency room following an episode of left facial droop and slurred speech as well as left extremity weakness. Symptoms lasted for about 20 minutes then began to improve. CT scan of his head showed no acute intracranial abnormality. In terms essentially resolved except for minimal residual left shoulder droop. NIH stroke score was 1. Patient has been taking aspirin 81 mg per day  LSN: 10:20 PM on 09/03/2014 tPA Given: No: Rapidly resolving deficits mRankin:  Past Medical History  Diagnosis Date  . 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 Hypertension   . CAD (coronary artery disease)     s/p cabg  . CHF (congestive heart failure)   . Peripheral vascular disease   . History of stroke   . Hyperlipidemia   . DM (diabetes mellitus)   . Diverticulosis   . Polyneuropathy, diabetic   . Seizure disorder, complex partial   . DJD (degenerative joint disease)   . History of anemia   . Atrial fibrillation     Post operative    Past Surgical History  Procedure Laterality Date  . Coronary artery bypass graft  02/13/2004    4 vesel  . Removal of sebaceous cyst from neck  08/2007    Dr. [name]  . Cataract extraction w/ intraocular lens  implant, bilateral  06/2013    Dr. [name]  . Tonsillectomy      Family History  Problem Relation Age of Onset  . Lung cancer    . Epilepsy    Family history also positive for stroke involving patient's mother.  Social History:  reports that he quit smoking about 37 years ago. His smoking use included Cigarettes. He has a 22 pack-year smoking history. He has never used smokeless tobacco. He reports that he does not  drink alcohol or use illicit drugs.  Allergies:  Allergies  Allergen Reactions  . Lisinopril Cough    Medications Prior to Admission  Medication Sig Dispense Refill  . allopurinol (ZYLOPRIM) 300 MG tablet TAKE 1 TABLET BY MOUTH EVERY DAY 90 tablet 4  . aspirin 325 MG tablet Take 325 mg by mouth daily.    Marland Kitchen atorvastatin (LIPITOR) 40 MG tablet Take 1 tablet (40 mg total) by mouth daily. 90 tablet 3  . Coenzyme Q10 (COQ10) 30 MG CAPS Take 1 tablet by mouth daily.      Marland Kitchen ezetimibe (ZETIA) 10 MG tablet Take 1 tablet (10 mg total) by mouth daily. 90 tablet 3  . furosemide (LASIX) 40 MG tablet TAKE 1 TABLET BY MOUTH EVERY DAY (Patient taking differently: TAKE 1 TABLET BY MOUTH EVERY DAY PRN FOR FLUID) 30 tablet 1  . glimepiride (AMARYL) 4 MG tablet TAKE 1 TABLET BY MOUTH EVERY DAY BEFORE BREAKFAST 30 tablet 2  . metFORMIN (GLUCOPHAGE) 1000 MG tablet Take 1 tablet (1,000 mg total) by mouth 2 (two) times daily with a meal. 180 tablet 3  . metoprolol succinate (TOPROL-XL) 50 MG 24 hr tablet TAKE 1 TABLET BY MOUTH EVERY DAY 90 tablet 2  . niacin (NIASPAN) 500 MG CR tablet Take 1 tablet (500 mg total) by mouth at bedtime. 90 tablet 3  . sitaGLIPtin (JANUVIA) 100 MG tablet Take 1 tablet (  100 mg total) by mouth daily. 90 tablet 3  . losartan (COZAAR) 25 MG tablet Take 1 tablet (25 mg total) by mouth daily. (Patient not taking: Reported on 09/04/2014) 30 tablet 1    ROS: History obtained from patient, his spouse and patient's daughter.  General ROS: negative for - chills, fatigue, fever, night sweats, weight gain or weight loss Psychological ROS: negative for - behavioral disorder, hallucinations, memory difficulties, mood swings or suicidal ideation Ophthalmic ROS: negative for - blurry vision, double vision, eye pain or loss of vision ENT ROS: negative for - epistaxis, nasal discharge, oral lesions, sore throat, tinnitus or vertigo Allergy and Immunology ROS: negative for - hives or itchy/watery  eyes Hematological and Lymphatic ROS: negative for - bleeding problems, bruising or swollen lymph nodes Endocrine ROS: negative for - galactorrhea, hair pattern changes, polydipsia/polyuria or temperature intolerance Respiratory ROS: negative for - cough, hemoptysis, shortness of breath or wheezing Cardiovascular ROS: negative for - chest pain, dyspnea on exertion, edema or irregular heartbeat Gastrointestinal ROS: negative for - abdominal pain, diarrhea, hematemesis, nausea/vomiting or stool incontinence Genito-Urinary ROS: negative for - dysuria, hematuria, incontinence or urinary frequency/urgency Musculoskeletal ROS: negative for - joint swelling or muscular weakness Neurological ROS: as noted in HPI Dermatological ROS: negative for rash and skin lesion changes  Physical Examination: Blood pressure 145/59, pulse 73, temperature 97.9 F (36.6 C), temperature source Oral, resp. rate 12, SpO2 99 %.  HEENT-  Normocephalic, no lesions, without obvious abnormality.  Normal external eye and conjunctiva.  Normal TM's bilaterally.  Normal auditory canals and external ears. Normal external nose, mucus membranes and septum.  Normal pharynx. Neck supple with no masses, nodes, nodules or enlargement. Cardiovascular - regular rate and rhythm, S1, S2 normal, no murmur, click, rub or gallop Lungs - chest clear, no wheezing, rales, normal symmetric air entry Abdomen - soft, non-tender; bowel sounds normal; no masses,  no organomegaly Extremities - no joint deformities, effusion, or inflammation and no edema  Neurologic Examination: Mental Status: Alert, oriented, thought content appropriate.  Speech fluent without evidence of aphasia. Able to follow commands without difficulty. Cranial Nerves: II-Visual fields were normal. III/IV/VI-Pupils were equal and reacted normally to light. Extraocular movements were full and conjugate.    V/VII-no facial numbness; slight left lower facial  weakness. VIII-normal. X-normal speech and symmetrical palatal movement. XI: trapezius strength/neck flexion strength normal bilaterally XII-midline tongue extension with normal strength. Motor: 5/5 bilaterally with normal tone and bulk Sensory: Normal throughout. Deep Tendon Reflexes: Trace to 1+ and symmetric in upper extremities and absent in lower extremities. Plantars: Flexor bilaterally Cerebellar: Normal finger-to-nose testing. Carotid auscultation: Normal  Results for orders placed or performed during the hospital encounter of 09/03/14 (from the past 48 hour(s))  CBC with Differential/Platelet     Status: None   Collection Time: 09/03/14 10:30 PM  Result Value Ref Range   WBC 6.8 4.0 - 10.5 K/uL   RBC 4.49 4.22 - 5.81 MIL/uL   Hemoglobin 14.1 13.0 - 17.0 g/dL   HCT 41.1 39.0 - 52.0 %   MCV 91.5 78.0 - 100.0 fL   MCH 31.4 26.0 - 34.0 pg   MCHC 34.3 30.0 - 36.0 g/dL   RDW 13.1 11.5 - 15.5 %   Platelets 248 150 - 400 K/uL   Neutrophils Relative % 46 43 - 77 %   Neutro Abs 3.1 1.7 - 7.7 K/uL   Lymphocytes Relative 40 12 - 46 %   Lymphs Abs 2.7 0.7 - 4.0 K/uL  Monocytes Relative 9 3 - 12 %   Monocytes Absolute 0.6 0.1 - 1.0 K/uL   Eosinophils Relative 5 0 - 5 %   Eosinophils Absolute 0.3 0.0 - 0.7 K/uL   Basophils Relative 0 0 - 1 %   Basophils Absolute 0.0 0.0 - 0.1 K/uL  I-stat chem 8, ed     Status: Abnormal   Collection Time: 09/03/14 11:59 PM  Result Value Ref Range   Sodium 140 135 - 145 mmol/L   Potassium 3.9 3.5 - 5.1 mmol/L   Chloride 100 (L) 101 - 111 mmol/L   BUN 18 6 - 20 mg/dL   Creatinine, Ser 1.00 0.61 - 1.24 mg/dL   Glucose, Bld 281 (H) 65 - 99 mg/dL   Calcium, Ion 1.21 1.13 - 1.30 mmol/L   TCO2 25 0 - 100 mmol/L   Hemoglobin 20.1 (H) 13.0 - 17.0 g/dL   HCT 59.0 (H) 39.0 - 52.0 %   Ct Head Wo Contrast  09/03/2014   CLINICAL DATA:  Patient with left-sided facial droop, confusion and disorientation. Symptoms subsequently resolved.  EXAM: CT HEAD  WITHOUT CONTRAST  TECHNIQUE: Contiguous axial images were obtained from the base of the skull through the vertex without intravenous contrast.  COMPARISON:  None.  FINDINGS: Ventricles and sulci are appropriate for patient's age. No evidence for acute cortically based infarct, intracranial hemorrhage, mass lesion or mass effect. Extensive periventricular and subcortical white matter hypodensity compatible with chronic small vessel ischemic changes. Bilateral cataract surgery. Paranasal sinuses are unremarkable. Mastoid air cells are well aerated. Calvarium is intact.  IMPRESSION: Chronic small vessel ischemic changes.  No acute intracranial process.   Electronically Signed   By: Lovey Newcomer M.D.   On: 09/03/2014 23:43    Assessment: 79 y.o. male with multiple risk factors for stroke presenting with probable TIA. However, a small vessel subcortical right cerebral infarction cannot be ruled out at this point.  Stroke Risk Factors - diabetes mellitus, family history, hyperlipidemia and hypertension  Plan: 1. HgbA1c, fasting lipid panel 2. MRI, MRA  of the brain without contrast 3. PT consult, OT consult, Speech consult 4. Echocardiogram 5. Carotid dopplers 6. Prophylactic therapy-Antiplatelet med: Aspirin  7. Risk factor modification 8. Telemetry monitoring  C.R. Nicole Kindred, MD [facility] [phone]  09/04/2014, 1:35 AM

## 2014-09-04 NOTE — Progress Notes (Signed)
EEG Completed; Results Pending  

## 2014-09-05 DIAGNOSIS — I10 Essential (primary) hypertension: Secondary | ICD-10-CM

## 2014-09-05 DIAGNOSIS — I639 Cerebral infarction, unspecified: Principal | ICD-10-CM

## 2014-09-05 DIAGNOSIS — I4891 Unspecified atrial fibrillation: Secondary | ICD-10-CM | POA: Insufficient documentation

## 2014-09-05 DIAGNOSIS — E119 Type 2 diabetes mellitus without complications: Secondary | ICD-10-CM

## 2014-09-05 LAB — CBC
HEMATOCRIT: 42.2 % (ref 39.0–52.0)
Hemoglobin: 14.7 g/dL (ref 13.0–17.0)
MCH: 31.9 pg (ref 26.0–34.0)
MCHC: 34.8 g/dL (ref 30.0–36.0)
MCV: 91.5 fL (ref 78.0–100.0)
Platelets: 242 10*3/uL (ref 150–400)
RBC: 4.61 MIL/uL (ref 4.22–5.81)
RDW: 13.3 % (ref 11.5–15.5)
WBC: 7.9 10*3/uL (ref 4.0–10.5)

## 2014-09-05 LAB — BASIC METABOLIC PANEL
ANION GAP: 7 (ref 5–15)
BUN: 11 mg/dL (ref 6–20)
CO2: 27 mmol/L (ref 22–32)
Calcium: 9.3 mg/dL (ref 8.9–10.3)
Chloride: 105 mmol/L (ref 101–111)
Creatinine, Ser: 0.92 mg/dL (ref 0.61–1.24)
GFR calc Af Amer: >60 mL/min (ref >60)
GFR calc non Af Amer: >60 mL/min (ref >60)
GLUCOSE: 243 mg/dL — AB (ref 65–99)
POTASSIUM: 4.1 mmol/L (ref 3.5–5.1)
Sodium: 139 mmol/L (ref 135–145)

## 2014-09-05 LAB — HEMOGLOBIN A1C
Hgb A1c MFr Bld: 7.3 % — ABNORMAL HIGH (ref 4.8–5.6)
MEAN PLASMA GLUCOSE: 163 mg/dL

## 2014-09-05 LAB — GLUCOSE, CAPILLARY
Glucose-Capillary: 160 mg/dL — ABNORMAL HIGH (ref 65–99)
Glucose-Capillary: 209 mg/dL — ABNORMAL HIGH (ref 65–99)

## 2014-09-05 MED ORDER — DIVALPROEX SODIUM ER 500 MG PO TB24: 500.0000 mg | ORAL_TABLET | Freq: Every day | ORAL | Status: DC

## 2014-09-05 MED ORDER — FUROSEMIDE 40 MG PO TABS: ORAL_TABLET | ORAL | Status: DC

## 2014-09-05 NOTE — Evaluation (Signed)
Occupational Therapy Evaluation and Discharge Patient Details Name: Fred [name] MRN: [id number] DOB: 12-Jan-1936 Today's Date: 09/05/2014    History of Present Illness 79 y.o. male with history of hypertension, coronary artery disease, congestive heart failure, peripheral vascular disease, previous stroke, complex partial seizure disorder, postoperative atrial fibrillation, and diabetes mellitus presenting with left hemiparesis, slurred speech, and confusion. QPR:FFMBW small nonhemorrhagic infarct right posterior frontal lobe    Clinical Impression   This 79 yo male admitted with above presents to acute OT with cognitive deficits that family will monitor to see if they really interfere with daily activities. No further OT needs, we will sign off.    Follow Up Recommendations  No OT follow up    Equipment Recommendations  None recommended by OT       Precautions / Restrictions Precautions Precautions: None Restrictions Weight Bearing Restrictions: No      Mobility Bed Mobility               General bed mobility comments: received in chair          ADL                                         General ADL Comments: Witnessed pt up with PT earlier in hallway and not displaying any balance issues do not feel like BADLs will be an issue.               Pertinent Vitals/Pain Pain Assessment: No/denies pain     Hand Dominance Right   Extremity/Trunk Assessment Upper Extremity Assessment Upper Extremity Assessment: Overall WFL for tasks assessed     Communication Communication Communication: HOH   Cognition Arousal/Alertness: Awake/alert Behavior During Therapy: WFL for tasks assessed/performed Overall Cognitive Status:  (Completed the MOCA (cognitive assessment) with pt and he scored 16/30 with a score of 26 being normal. His main areas of impairment on test were visuospatial/executive functioning, attention with subtraction math  problem, language with naming words that start with "F", abstraction, and delayed recall). Pt reports that he is not good with math, that he nor his wife are good at remembering (items they need from store without writing them down. 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 Wife does the finances so no issues there with the math.        Memory: Decreased short-term memory                        Home Living Family/patient expects to be discharged to:: Private residence Living Arrangements: Spouse/significant other Available Help at Discharge: Family Type of Home: House Home Access: Stairs to enter Technical brewer of Steps: 2 Entrance Stairs-Rails: None Home Layout: Bed/bath upstairs     Bathroom Shower/Tub: Tub/shower unit Shower/tub characteristics: Architectural technologist: Standard     Home Equipment: Cane - single point          Prior Functioning/Environment Level of Independence: Independent             OT Diagnosis: Cognitive deficits (may be age related as well)         OT Goals(Current goals can be found in the care plan section)  Acute Rehab OT Goals Patient Stated Goal: home today  OT Frequency:                End of Session    Activity Tolerance: Patient tolerated treatment well Patient left: in chair;with call bell/phone within reach;with family/visitor present   Time: 5993-5701 OT Time Calculation (min): 29 min Charges:  OT General Charges $OT Visit: 1 Procedure OT Evaluation $Initial OT Evaluation Tier I: 1 Procedure OT Treatments $Cognitive Skills Development: 8-22 mins  Almon Register [phone] 09/05/2014, 2:54 PM

## 2014-09-05 NOTE — Progress Notes (Signed)
STROKE TEAM PROGRESS NOTE   HISTORY Fred [name] is an 79 y.o. male history of hypertension, coronary artery disease, CHF, peripheral vascular disease, history of stroke, complex partial seizure disorder and diabetes mellitus, brought to the emergency room following an episode of left facial droop and slurred speech as well as left extremity weakness. Symptoms lasted for about 20 minutes then began to improve. CT scan of his head showed no acute intracranial abnormality. In terms essentially resolved except for minimal residual left shoulder droop. NIH stroke score was 1. Patient has been taking aspirin 81 mg per day  LSN: 10:20 PM on 09/03/2014 tPA Given: No: Rapidly resolving deficits mRankin:   SUBJECTIVE (INTERVAL HISTORY) Multiple family members present. Dr. Leonie Man discussed   OBJECTIVE Temp:  [97.6 F (36.4 C)-98.4 F (36.9 C)] 97.7 F (36.5 C) (06/24 1017) Pulse Rate:  [62-75] 63 (06/24 1017) Cardiac Rhythm:  [-] Normal sinus rhythm (06/24 0805) Resp:  [16-18] 16 (06/24 1017) BP: (129-173)/(57-91) 129/57 mmHg (06/24 1017) SpO2:  [96 %-98 %] 97 % (06/24 1017)   Recent Labs Lab 09/04/14 1112 09/04/14 1630 09/04/14 2202 09/05/14 0701 09/05/14 1141  GLUCAP 138* 118* 134* 160* 209*    Recent Labs Lab 09/03/14 2359 09/05/14 1026  NA 140 139  K 3.9 4.1  CL 100* 105  CO2  --  27  GLUCOSE 281* 243*  BUN 18 11  CREATININE 1.00 0.92  CALCIUM  --  9.3   No results for input(s): AST, ALT, ALKPHOS, BILITOT, PROT, ALBUMIN in the last 168 hours.  Recent Labs Lab 09/03/14 2230 09/03/14 2359 09/05/14 1026  WBC 6.8  --  7.9  NEUTROABS 3.1  --   --   HGB 14.1 20.1* 14.7  HCT 41.1 59.0* 42.2  MCV 91.5  --  91.5  PLT 248  --  242   No results for input(s): CKTOTAL, CKMB, CKMBINDEX, TROPONINI in the last 168 hours. No results for input(s): LABPROT, INR in the last 72 hours. No results for input(s): COLORURINE, LABSPEC, [place], GLUCOSEU, HGBUR, BILIRUBINUR,  KETONESUR, PROTEINUR, UROBILINOGEN, NITRITE, LEUKOCYTESUR in the last 72 hours.  Invalid input(s): APPERANCEUR     Component Value Date/Time   CHOL 97 09/04/2014 0505   TRIG 98 09/04/2014 0505   HDL 27* 09/04/2014 0505   CHOLHDL 3.6 09/04/2014 0505   VLDL 20 09/04/2014 0505   LDLCALC 50 09/04/2014 0505   Lab Results  Component Value Date   HGBA1C 7.3* 09/04/2014   No results found for: LABOPIA, COCAINSCRNUR, LABBENZ, AMPHETMU, THCU, LABBARB  No results for input(s): ETH in the last 168 hours.    EEG 09/04/2014 Normal electroencephalogram, awake, drowsy and with activation procedures. There are no focal lateralizing or epileptiform features.   Imaging   Ct Head Wo Contrast 09/03/2014    Chronic small vessel ischemic changes.  No acute intracranial process.       MRI Brain 09/04/2014 Acute small nonhemorrhagic infarct right posterior frontal lobe (motor strip). Remote small thalamic infarcts. Significant small vessel disease type changes. Global atrophy. Slight progression of ventricular prominence may reflect result of central atrophy rather than hydrocephalus.   MRA Head 09/04/2014 Anterior circulation without medium or large size vessel significant stenosis or occlusion. Mild narrowing distal left vertebral artery. Nonvisualized left posterior inferior cerebellar artery and right anterior inferior cerebellar artery. Moderate to marked focal stenosis P1 segment right posteriorcerebral artery. Mild to moderate narrowing right superior cerebellar artery.     PHYSICAL EXAM Pleasant elderly Caucasian male not in  distress. He is hard of hearing. . Afebrile. Head is nontraumatic. Neck is supple without bruit.    Cardiac exam no murmur or gallop. Lungs are clear to auscultation. Distal pulses are well felt. Neurological Exam ;  Awake  Alert oriented x 3. Normal speech and language.eye movements full without nystagmus.fundi were not visualized. Vision acuity and fields  appear normal. Hearing is poor bilaterally.l. Palatal movements are normal. Face symmetric. Tongue midline. Normal strength, tone, reflexes and coordination. Normal sensation. Gait deferred.   ASSESSMENT/PLAN [name] is a 78 y.o. male with history of  hypertension, coronary artery disease, congestive heart failure, peripheral vascular disease, previous stroke, complex partial seizure disorder, postoperative atrial fibrillation, and diabetes mellitus presenting with left hemiparesis, slurred speech, and confusion.  He did not receive IV t-PA due to rapidly resolving deficits.   Stroke:  Non-dominant infarct secondary to small vessel disease.  Resultant  resolution of deficits  MRI  Acute small nonhemorrhagic infarct right posterior frontal lobe (motor strip). Remote small thalamic infarcts.  MRA  posterior changes as noted above  Carotid Doppler  1-39% ICA stenosis. Vertebral artery flow is antegrade.   2D Echo EF 45-50%. No cardiac source of emboli identified  LDL 50  HgbA1c 7.2  Lovenox for VTE prophylaxis Diet heart healthy/carb modified Room service appropriate?: Yes; Fluid consistency:: Thin Diet - low sodium heart healthy Diet Carb Modified  aspirin 325 mg orally every day prior to admission, now on aspirin 325 mg orally every day  Patient counseled to be compliant with his antithrombotic medications  Ongoing aggressive stroke risk factor management  Therapy recommendations: No follow-up PT or OT recommended  Disposition:  Discharge today  Hypertension  Home meds:  Toprol XL and Cozaar  Stable  Patient counseled to be compliant with his blood pressure medications  Hyperlipidemia  Home meds:  Lipitor 40 mg daily, Zetia 10 mg daily, and niacin resumed in hospital  LDL 50, goal < 70  Continue statin at discharge  Diabetes  HgbA1c 7.2, goal < 7.0  Uncontrolled  Other Stroke Risk Factors  Advanced age  Cigarette smoker, quit smoking 37  years ago.  Obesity, Body mass index is 33.95 kg/(m^2).   Hx stroke/TIA  Coronary artery disease    Other Active Problems     PLAN  Complex partial seizure disorder - EEG normal - Depakote started yesterday  Dr Leonie Man recommended no driving for 3-6 months  Follow-up with Dr. Leonie Man in 2 months  Postoperative atrial fibrillation hx (CABG 2005) - sinus rhythm on telemetry here.   Hospital day #   Mikey Bussing PA-C [facility] Pager [phone] 09/05/2014, 2:51 PM I have personally examined this patient, reviewed notes, independently viewed imaging studies, participated in medical decision making and plan of care. I have made any additions or clarifications directly to the above note. Agree with note above. Stroke service will sign off at the current time. Follow-up as an outpatient in 2 months.  Antony Contras, MD Medical Director [facility] Pager: [phone] 09/05/2014 3:36 PM     To contact Stroke Continuity provider, please refer to [email or web address] After hours, contact General Neurology

## 2014-09-05 NOTE — Discharge Summary (Signed)
Physician Discharge Summary  Fred [name] ZOX:[id number] DOB: [birth year] DOA: 09/03/2014  PCP: Beatrice Lecher, MD  Admit date: 09/03/2014 Discharge date: 09/05/2014  Time spent: 35 minutes  Recommendations for Outpatient Follow-up:  1. No driving until seen by neurology and cleared  Discharge Diagnoses:  Principal Problem:   TIA (transient ischemic attack) Active Problems:   Essential hypertension   Coronary atherosclerosis   PAROXYSMAL ATRIAL FIBRILLATION   DM (diabetes mellitus)   Seizure disorder, complex partial   Weak   Acute CVA (cerebrovascular accident)   Discharge Condition: improved  Diet recommendation: cardiac/diabetic  Filed Weights   09/04/14 0141  Weight: 86.909 kg (191 lb 9.6 oz)    History of present illness:  Fred [name] is a 79 y.o. male with a history of CAD S/P CABG x 4, Atrial Fibrillation on ASA Rx, DM2, HTN, and previous hx of Seizures who presents to the ED with complaints of sudden onset of A right sided facial droop and confusion with slurred speech and left sided weakness that lasted 15-20 minutes witnessed by his family. The episode started when he and his family had driven home from a restaurant and he was getting out of the car Between 8:20 pm and 9 pm. EMS was called and he was transported to the ED, and in the ED he reported weakness in his left foot and then it resolved.  Hospital Course CVA MRI/MRA + 2D ECHO and Carotid US done LDL: 50 HgA1C 7.2-- goal < 7  ASA Rx Seen by Neuro  PT/OT   PAROXYSMAL ATRIAL FIBRILLATION Continue ASA Rx for now- follow up with neurology outpatient for changes Continue Metoprolol Rx   Coronary  atherosclerosis Continue Metoprolol, Losartan, Atorvastatin and ASA Rx   Essential hypertension Continue Metoprolol, Losartan Monitor BPs   DM (diabetes mellitus) Holding Oral Hypoglycemics SSI coverage PRN    Seizure disorder, complex partial -neuro recommends depakote -no driving   Dyslipidemia- Continue Atorvastatin Rx  Procedures:  echo  Consultations:  neuro  Discharge Exam: Filed Vitals:   09/05/14 1017  BP: 129/57  Pulse: 63  Temp: 97.7 F (36.5 C)  Resp: 16     Discharge Instructions   Discharge Instructions    Diet - low sodium heart healthy    Complete by:  As directed      Diet Carb Modified    Complete by:  As directed      Discharge instructions    Complete by:  As directed   No driving until cleared by neurology (expect 3-6 months)     Increase activity slowly    Complete by:  As directed           Current Discharge Medication List    START taking these medications   Details  divalproex (DEPAKOTE ER) 500 MG 24 hr tablet Take 1 tablet (500 mg total) by mouth daily. Qty: 30 tablet, Refills: 0      CONTINUE these medications which have CHANGED   Details  furosemide (LASIX) 40 MG tablet TAKE 1 TABLET BY MOUTH EVERY DAY PRN FOR FLUID Qty: 30 tablet, Refills: 1      CONTINUE these medications which have NOT CHANGED   Details  allopurinol (ZYLOPRIM) 300 MG tablet TAKE 1 TABLET BY MOUTH EVERY DAY Qty: 90 tablet, Refills: 4    aspirin 325 MG tablet Take 325 mg by mouth daily.    atorvastatin (LIPITOR) 40 MG tablet Take 1 tablet (40 mg total) by mouth daily. Qty: 90 tablet, Refills: 3  Coenzyme Q10 (COQ10) 30 MG CAPS Take 1 tablet by mouth daily.      ezetimibe (ZETIA) 10 MG tablet Take 1 tablet (10 mg total) by mouth daily. Qty: 90  tablet, Refills: 3    glimepiride (AMARYL) 4 MG tablet TAKE 1 TABLET BY MOUTH EVERY DAY BEFORE BREAKFAST Qty: 30 tablet, Refills: 2    metFORMIN (GLUCOPHAGE) 1000 MG tablet Take 1 tablet (1,000 mg total) by mouth 2 (two) times daily with a meal. Qty: 180 tablet, Refills: 3    metoprolol succinate (TOPROL-XL) 50 MG 24 hr tablet TAKE 1 TABLET BY MOUTH EVERY DAY Qty: 90 tablet, Refills: 2    niacin (NIASPAN) 500 MG CR tablet Take 1 tablet (500 mg total) by mouth at bedtime. Qty: 90 tablet, Refills: 3    sitaGLIPtin (JANUVIA) 100 MG tablet Take 1 tablet (100 mg total) by mouth daily. Qty: 90 tablet, Refills: 3      STOP taking these medications     losartan (COZAAR) 25 MG tablet        Allergies  Allergen Reactions  . Lisinopril Cough      The results of significant diagnostics from this hospitalization (including imaging, microbiology, ancillary and laboratory) are listed below for reference.    Significant Diagnostic Studies: Dg Chest 2 View  08/19/2014   CLINICAL DATA:  One month history of cough and shortness of breath. Former smoker. Current history of hypertension and diabetes. Prior CABG.  EXAM: CHEST  2 VIEW  COMPARISON:  05/16/2013 and earlier.  FINDINGS: Prior sternotomy for CABG. Cardiac silhouette upper normal in size, unchanged. Chronic scarring involving the left lower lobe and lingula, unchanged. Lungs otherwise clear. No localized airspace consolidation. No pleural effusions. No pneumothorax. Normal pulmonary vascularity. Thoracic aorta atherosclerotic and mildly tortuous, unchanged. Hilar and mediastinal contours otherwise unremarkable. Degenerative disc disease and spondylosis throughout the thoracic spine.  IMPRESSION: Stable borderline heart size. Stable scarring in the left lower lobe and lingula. No acute cardiopulmonary disease.   Electronically Signed   By: Evangeline Dakin M.D.   On: 08/19/2014 17:24   Ct Head Wo Contrast  09/03/2014   CLINICAL DATA:  Patient  with left-sided facial droop, confusion and disorientation. Symptoms subsequently resolved.  EXAM: CT HEAD WITHOUT CONTRAST  TECHNIQUE: Contiguous axial images were obtained from the base of the skull through the vertex without intravenous contrast.  COMPARISON:  None.  FINDINGS: Ventricles and sulci are appropriate for patient's age. No evidence for acute cortically based infarct, intracranial hemorrhage, mass lesion or mass effect. Extensive periventricular and subcortical white matter hypodensity compatible with chronic small vessel ischemic changes. Bilateral cataract surgery. Paranasal sinuses are unremarkable. Mastoid air cells are well aerated. Calvarium is intact.  IMPRESSION: Chronic small vessel ischemic changes.  No acute intracranial process.   Electronically Signed   By: Lovey Newcomer M.D.   On: 09/03/2014 23:43   Mri Brain Without Contrast  09/04/2014   CLINICAL DATA:  79 year old diabetic hypertensive male with history of seizures, atrial fibrillation and neoplasm of the digestive tract presenting with new onset of left-sided weakness and slurred speech with confusion. Subsequent encounter.  EXAM: MRI HEAD WITHOUT CONTRAST  MRA HEAD WITHOUT CONTRAST  TECHNIQUE: Multiplanar, multiecho pulse sequences of the brain and surrounding structures were obtained without intravenous contrast. Angiographic images of the head were obtained using MRA technique without contrast.  COMPARISON:  09/03/2014 head CT. 05/12/2004 brain MR and MR angiogram.  FINDINGS: MRI HEAD FINDINGS  Acute small nonhemorrhagic infarct right posterior frontal  lobe (motor strip).  Remote small thalamic infarcts.  Significant small vessel disease type changes.  No intracranial hemorrhage.  No intracranial mass lesion noted on this unenhanced exam.  Global atrophy. Slight progression of ventricular prominence may reflect result of central atrophy rather than hydrocephalus.  Major intracranial vascular structures are patent and ectatic.   Post lens replacement otherwise orbital structures unremarkable.  Cervical medullary junction, pituitary region and pineal region unremarkable.  MRA HEAD FINDINGS  Ectatic left internal carotid artery cervical segment.  Mild narrowing and ectasia cavernous segment internal carotid artery bilaterally.  Anterior circulation without medium or large size vessel significant stenosis or occlusion.  Mild middle cerebral artery branch vessel irregularity bilaterally.  Right vertebral artery is dominant. Mild narrowing distal left vertebral artery.  Nonvisualized left posterior inferior cerebellar artery and right anterior inferior cerebellar artery.  Moderate to marked focal stenosis P1 segment right posterior cerebral artery.  Mild to moderate narrowing right superior cerebellar artery.  No aneurysm noted.  IMPRESSION: MRI HEAD  Acute small nonhemorrhagic infarct right posterior frontal lobe (motor strip).  Remote small thalamic infarcts.  Significant small vessel disease type changes.  Global atrophy. Slight progression of ventricular prominence may reflect result of central atrophy rather than hydrocephalus.  MRA HEAD  Anterior circulation without medium or large size vessel significant stenosis or occlusion.  Mild narrowing distal left vertebral artery.  Nonvisualized left posterior inferior cerebellar artery and right anterior inferior cerebellar artery.  Moderate to marked focal stenosis P1 segment right posterior cerebral artery.  Mild to moderate narrowing right superior cerebellar artery.   Electronically Signed   By: Genia Del M.D.   On: 09/04/2014 14:07   Mr Mra Head/brain Wo Cm  09/04/2014   CLINICAL DATA:  79 year old diabetic hypertensive male with history of seizures, atrial fibrillation and neoplasm of the digestive tract presenting with new onset of left-sided weakness and slurred speech with confusion. Subsequent encounter.  EXAM: MRI HEAD WITHOUT CONTRAST  MRA HEAD WITHOUT CONTRAST  TECHNIQUE:  Multiplanar, multiecho pulse sequences of the brain and surrounding structures were obtained without intravenous contrast. Angiographic images of the head were obtained using MRA technique without contrast.  COMPARISON:  09/03/2014 head CT. 05/12/2004 brain MR and MR angiogram.  FINDINGS: MRI HEAD FINDINGS  Acute small nonhemorrhagic infarct right posterior frontal lobe (motor strip).  Remote small thalamic infarcts.  Significant small vessel disease type changes.  No intracranial hemorrhage.  No intracranial mass lesion noted on this unenhanced exam.  Global atrophy. Slight progression of ventricular prominence may reflect result of central atrophy rather than hydrocephalus.  Major intracranial vascular structures are patent and ectatic.  Post lens replacement otherwise orbital structures unremarkable.  Cervical medullary junction, pituitary region and pineal region unremarkable.  MRA HEAD FINDINGS  Ectatic left internal carotid artery cervical segment.  Mild narrowing and ectasia cavernous segment internal carotid artery bilaterally.  Anterior circulation without medium or large size vessel significant stenosis or occlusion.  Mild middle cerebral artery branch vessel irregularity bilaterally.  Right vertebral artery is dominant. Mild narrowing distal left vertebral artery.  Nonvisualized left posterior inferior cerebellar artery and right anterior inferior cerebellar artery.  Moderate to marked focal stenosis P1 segment right posterior cerebral artery.  Mild to moderate narrowing right superior cerebellar artery.  No aneurysm noted.  IMPRESSION: MRI HEAD  Acute small nonhemorrhagic infarct right posterior frontal lobe (motor strip).  Remote small thalamic infarcts.  Significant small vessel disease type changes.  Global atrophy. Slight progression of ventricular prominence may  reflect result of central atrophy rather than hydrocephalus.  MRA HEAD  Anterior circulation without medium or large size vessel  significant stenosis or occlusion.  Mild narrowing distal left vertebral artery.  Nonvisualized left posterior inferior cerebellar artery and right anterior inferior cerebellar artery.  Moderate to marked focal stenosis P1 segment right posterior cerebral artery.  Mild to moderate narrowing right superior cerebellar artery.   Electronically Signed   By: Genia Del M.D.   On: 09/04/2014 14:07    Microbiology: No results found for this or any previous visit (from the past 240 hour(s)).   Labs: Basic Metabolic Panel:  Recent Labs Lab 09/03/14 2359 09/05/14 1026  NA 140 139  K 3.9 4.1  CL 100* 105  CO2  --  27  GLUCOSE 281* 243*  BUN 18 11  CREATININE 1.00 0.92  CALCIUM  --  9.3   Liver Function Tests: No results for input(s): AST, ALT, ALKPHOS, BILITOT, PROT, ALBUMIN in the last 168 hours. No results for input(s): LIPASE, AMYLASE in the last 168 hours. No results for input(s): AMMONIA in the last 168 hours. CBC:  Recent Labs Lab 09/03/14 2230 09/03/14 2359 09/05/14 1026  WBC 6.8  --  7.9  NEUTROABS 3.1  --   --   HGB 14.1 20.1* 14.7  HCT 41.1 59.0* 42.2  MCV 91.5  --  91.5  PLT 248  --  242   Cardiac Enzymes: No results for input(s): CKTOTAL, CKMB, CKMBINDEX, TROPONINI in the last 168 hours. BNP: BNP (last 3 results) No results for input(s): BNP in the last 8760 hours.  ProBNP (last 3 results) No results for input(s): PROBNP in the last 8760 hours.  CBG:  Recent Labs Lab 09/04/14 1112 09/04/14 1630 09/04/14 2202 09/05/14 0701 09/05/14 1141  GLUCAP 138* 118* 134* 160* 209*       Signed:  Khalani Novoa  [facility] 09/05/2014, 2:36 PM

## 2014-09-05 NOTE — Progress Notes (Signed)
Pt transported from unit per wc in no obvious distress. Cd materials explained given, scrips given to family. dc'd to care of family at lobby.

## 2014-09-05 NOTE — Clinical Documentation Improvement (Signed)
Presents with acute CVA; CHF is documented in H&P.   ECHO resulted with EF 45-50% with Grade 1 Diastolic Dysfunction  Being treated with PO Toprol XL daily  Please clarify the type and specificity of patient's CHF and document findings in next progress note and include in discharge summary if applicable   Acuity - acute, chronic, acute on chronic  Type - systolic, diastolic, systolic and diastolic  Other condition  Unable to determine  Thank You, Zoila Shutter ,RN Clinical Documentation Specialist:  [place] Information Management

## 2014-09-05 NOTE — Evaluation (Signed)
Physical Therapy Evaluation Patient Details Name: Fred [name] MRN: [id number] DOB: Apr 13, 1935 Today's Date: 09/05/2014   History of Present Illness  80 y.o. male with history of hypertension, coronary artery disease, congestive heart failure, peripheral vascular disease, previous stroke, complex partial seizure disorder, postoperative atrial fibrillation, and diabetes mellitus presenting with left hemiparesis, slurred speech, and confusion  Clinical Impression  Patient mobilizing well, no significant deficits noted at this time. No further acute PT needs. Patient in agreement. Will have family supervision upon discharge. Will sign off.    Follow Up Recommendations No PT follow up    Equipment Recommendations  None recommended by PT    Recommendations for Other Services       Precautions / Restrictions Restrictions Weight Bearing Restrictions: No      Mobility  Bed Mobility               General bed mobility comments: received in chair  Transfers Overall transfer level: Independent               General transfer comment: no assist or cues required  Ambulation/Gait Ambulation/Gait assistance: Independent Ambulation Distance (Feet): 380 Feet Assistive device: None Gait Pattern/deviations: WFL(Within Functional Limits)     General Gait Details: steady with ambulation  Stairs Stairs: Yes Stairs assistance: Modified independent (Device/Increase time) Stair Management: One rail Right;Alternating pattern;Forwards Number of Stairs: 12 General stair comments: no physical assist, came down backwards with R hand rail  Wheelchair Mobility    Modified Rankin (Stroke Patients Only)       Balance Overall balance assessment: No apparent balance deficits (not formally assessed)                               Standardized Balance Assessment Standardized Balance Assessment : Dynamic Gait Index   Dynamic Gait Index Level Surface:  Normal Change in Gait Speed: Normal Gait with Horizontal Head Turns: Mild Impairment Gait with Vertical Head Turns: Mild Impairment Gait and Pivot Turn: Normal Step Over Obstacle: Mild Impairment Step Around Obstacles: Mild Impairment Steps: Mild Impairment Total Score: 19       Pertinent Vitals/Pain Pain Assessment: No/denies pain    Home Living Family/patient expects to be discharged to:: Private residence Living Arrangements: Alone Available Help at Discharge: Family Type of Home: House Home Access: Stairs to enter Entrance Stairs-Rails: None Technical brewer of Steps: 2 Home Layout: Bed/bath upstairs Home Equipment: Cane - single point      Prior Function Level of Independence: Independent               Hand Dominance   Dominant Hand: Right    Extremity/Trunk Assessment   Upper Extremity Assessment: Overall WFL for tasks assessed           Lower Extremity Assessment: Overall WFL for tasks assessed (Right knee pain at times from OA)         Communication   Communication: HOH  Cognition Arousal/Alertness: Awake/alert Behavior During Therapy: WFL for tasks assessed/performed Overall Cognitive Status: Within Functional Limits for tasks assessed                      General Comments      Exercises        Assessment/Plan    PT Assessment Patent does not need any further PT services  PT Diagnosis Generalized weakness   PT Problem List    PT Treatment Interventions  PT Goals (Current goals can be found in the Care Plan section) Acute Rehab PT Goals Patient Stated Goal: to go home PT Goal Formulation: All assessment and education complete, DC therapy    Frequency     Barriers to discharge        Co-evaluation               End of Session Equipment Utilized During Treatment: Gait belt Activity Tolerance: Patient tolerated treatment well Patient left: in chair;with call bell/phone within reach;with  family/visitor present Nurse Communication: Mobility status         Time: 7096-2836 PT Time Calculation (min) (ACUTE ONLY): 19 min   Charges:   PT Evaluation $Initial PT Evaluation Tier I: 1 Procedure     PT G CodesDuncan Dull 2014-09-23, 11:58 AM Alben Deeds, PT DPT  [phone]

## 2014-09-06 ENCOUNTER — Telehealth: Payer: Self-pay | Admitting: Neurology

## 2014-09-06 NOTE — Telephone Encounter (Signed)
I have talked with his granddaughter Haydee Salter, to clarify his medications,   He was discharged in September 05 2014, he should take ASA 325mg  daily,also Depakote ER 500mg  qhs.  He was given Lovenox during hospital for DVT prophylaxis.

## 2014-09-08 ENCOUNTER — Telehealth: Payer: Self-pay | Admitting: *Deleted

## 2014-09-08 NOTE — Telephone Encounter (Signed)
Opened in error

## 2014-09-17 ENCOUNTER — Other Ambulatory Visit: Payer: Self-pay | Admitting: Family Medicine

## 2014-09-17 ENCOUNTER — Other Ambulatory Visit: Payer: Self-pay | Admitting: Pulmonary Disease

## 2014-09-21 ENCOUNTER — Other Ambulatory Visit: Payer: Self-pay | Admitting: Family Medicine

## 2014-09-22 NOTE — Telephone Encounter (Signed)
Called pharmacy, Rx was received by pharmacy. No error.

## 2014-09-29 ENCOUNTER — Encounter: Payer: Self-pay | Admitting: Family Medicine

## 2014-09-29 ENCOUNTER — Ambulatory Visit (INDEPENDENT_AMBULATORY_CARE_PROVIDER_SITE_OTHER): Payer: Medicare Other | Admitting: Family Medicine

## 2014-09-29 VITALS — BP 127/71 | HR 72 | Ht 63.0 in | Wt 191.0 lb

## 2014-09-29 DIAGNOSIS — G451 Carotid artery syndrome (hemispheric): Secondary | ICD-10-CM | POA: Diagnosis not present

## 2014-09-29 DIAGNOSIS — I639 Cerebral infarction, unspecified: Secondary | ICD-10-CM | POA: Diagnosis not present

## 2014-09-29 DIAGNOSIS — I10 Essential (primary) hypertension: Secondary | ICD-10-CM | POA: Diagnosis not present

## 2014-09-29 DIAGNOSIS — E119 Type 2 diabetes mellitus without complications: Secondary | ICD-10-CM | POA: Diagnosis not present

## 2014-09-29 DIAGNOSIS — I4891 Unspecified atrial fibrillation: Secondary | ICD-10-CM

## 2014-09-29 MED ORDER — INSULIN GLARGINE 100 UNIT/ML SOLOSTAR PEN: 10.0000 [IU] | PEN_INJECTOR | Freq: Every day | SUBCUTANEOUS | Status: DC

## 2014-09-29 NOTE — Progress Notes (Addendum)
   Subjective:    Patient ID: Fred [name], male    DOB: [birth year], 79 y.o.   MRN: [id number]  HPI Here today for hospital follow-up. He is a 79 year old male with a history of coronary artery disease, atrial fibrillation, diabetes, and hypertension and prior seizure history who was admitted to the hospital on June 23 for a TIA. He did have a 2-D echo and carotid ultrasound done. Hemoglobin A1c was 7.2 at that time. He was seen by neurology and started with physical therapy and occupational therapy. He was started on aspirin. He has not had any more episodes since he has been home and in fact says he feels great today.  Diabetes-currently on metformin and Januvia. He's been very compliant with his medications. He says that his A1c was up a little bit the hospital. Was 7.3.  Hypertension-they did hold his losartan while he was in the hospital. We had recently switched him off of lisinopril because of a cough.   MRI Acute small nonhemorrhagic infarct right posterior frontal lobe (motor strip). Remote small thalamic infarcts.  Review of Systems     Objective:   Physical Exam  Constitutional: He is oriented to person, place, and time. He appears well-developed and well-nourished.  HENT:  Head: Normocephalic and atraumatic.  Neck: Neck supple. No thyromegaly present.  Cardiovascular: Normal rate, regular rhythm and normal heart sounds.   No carotid bruits.   Pulmonary/Chest: Effort normal and breath sounds normal.  Musculoskeletal: He exhibits no edema.  Lymphadenopathy:    He has no cervical adenopathy.  Neurological: He is alert and oriented to person, place, and time.  Skin: Skin is warm and dry.  Psychiatric: He has a normal mood and affect. His behavior is normal.          Assessment & Plan:  TIA-currently on Lipitor and Zetia. He has a follow-up with neurology in August. Strongly encouraged him not to drive until he is cleared by the neurologist in August. And also  discussed this with his wife and his daughter he was on speaker phone in the room. Continue daily aspirin therapy.  Hypertension-well controlled. Continue current regimen.  We will continue to hold the losartan for now as his blood pressure looks fantastic we can always restart it if needed.  Atrial fibrillation/CAD-has follow-up with Dr. Stanford Breed next month as well. Continue metoprolol    Diabetes-not at maximal goal. A1C is 7.3. Will add Lantus 10 units at betime.

## 2014-10-03 ENCOUNTER — Other Ambulatory Visit: Payer: Self-pay | Admitting: Family Medicine

## 2014-10-03 MED ORDER — PEN NEEDLES 32G X 4 MM MISC: 1.0000 | Freq: Every day | Status: DC

## 2014-10-07 ENCOUNTER — Other Ambulatory Visit: Payer: Self-pay | Admitting: Family Medicine

## 2014-10-09 NOTE — Progress Notes (Signed)
HPI: FU CAD; history of coronary artery disease status post coronary artery bypass graft in December 2005. Cardiac catheterization. on February 02, 2009 showed an ejection fraction of 55% but his filling pressures were elevated with a LVEDP of 27. 2 of 4 grafts were patent. He was started on diuretics at that time. Nuclear study May of 2014 showed EF 60, inferior MI and mild peri-infarct ischemia. Patient had stroke 6/16. Carotid dopplers 6/16 showed 1-39 bilateral stenosis. Echo 6/16 showed EF 24-58, grade 1 diastolic dysfunction, mildly reduced RV function. Since I last saw him, he denies dyspnea, chest pain, palpitations or syncope.  Current Outpatient Prescriptions  Medication Sig Dispense Refill  . allopurinol (ZYLOPRIM) 300 MG tablet TAKE 1 TABLET BY MOUTH EVERY DAY 90 tablet 4  . aspirin 325 MG tablet Take 325 mg by mouth daily.    Marland Kitchen atorvastatin (LIPITOR) 40 MG tablet Take 1 tablet (40 mg total) by mouth daily. 90 tablet 3  . Coenzyme Q10 (COQ10) 30 MG CAPS Take 1 tablet by mouth daily.      . divalproex (DEPAKOTE ER) 500 MG 24 hr tablet TAKE 1 TABLET BY MOUTH DAILY 30 tablet 0  . ezetimibe (ZETIA) 10 MG tablet Take 1 tablet (10 mg total) by mouth daily. 90 tablet 3  . furosemide (LASIX) 40 MG tablet TAKE 1 TABLET BY MOUTH EVERY DAY PRN FOR FLUID 30 tablet 1  . glimepiride (AMARYL) 4 MG tablet TAKE 1 TABLET BY MOUTH EVERY DAY BEFORE BREAKFAST 30 tablet 2  . Insulin Glargine (LANTUS SOLOSTAR) 100 UNIT/ML Solostar Pen Inject 10 Units into the skin daily at 10 pm. 10 mL 11  . Insulin Pen Needle (PEN NEEDLES) 32G X 4 MM MISC 1 each by Does not apply route daily. 100 each 3  . losartan (COZAAR) 25 MG tablet Take 1 tablet by mouth daily.  1  . metFORMIN (GLUCOPHAGE) 1000 MG tablet Take 1 tablet (1,000 mg total) by mouth 2 (two) times daily with a meal. 180 tablet 3  . metoprolol succinate (TOPROL-XL) 50 MG 24 hr tablet TAKE 1 TABLET BY MOUTH EVERY DAY 90 tablet 2  . niacin (NIASPAN) 500  MG CR tablet Take 1 tablet (500 mg total) by mouth at bedtime. 90 tablet 3  . sitaGLIPtin (JANUVIA) 100 MG tablet Take 1 tablet (100 mg total) by mouth daily. 90 tablet 3   No current facility-administered medications for this visit.     Past Medical History  Diagnosis Date  . 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 Hypertension   . CAD (coronary artery disease)     s/p cabg  . CHF (congestive heart failure)   . Peripheral vascular disease   . History of stroke   . Hyperlipidemia   . DM (diabetes mellitus)   . Diverticulosis   . Polyneuropathy, diabetic   . Seizure disorder, complex partial   . DJD (degenerative joint disease)   . History of anemia   . Atrial fibrillation     Post operative    Past Surgical History  Procedure Laterality Date  . Coronary artery bypass graft  02/13/2004    4 vesel  . Removal of sebaceous cyst from neck  08/2007    Dr. [name]  . Cataract extraction w/ intraocular lens  implant, bilateral  06/2013    Dr. [name]  . Tonsillectomy      History   Social History  . Marital Status: Married  Spouse Name: Daemyn Gariepy  . Number of Children: 3  . Years of Education: N/A   Occupational History  . 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 Smoking status: Former Smoker -- 1.00 packs/day for 22 years    Types: Cigarettes    Quit date: 03/14/1977  . Smokeless tobacco: Never Used  . Alcohol Use: No  . Drug Use: No  . Sexual Activity: No   Other Topics Concern  . Not on file   Social History Narrative   3 caffeinated drinks per day. Mostly coffee. He walks 15 minutes 3 days per week. Quit smoking in 1978.    ROS: no fevers or chills, productive cough, hemoptysis, dysphasia, odynophagia, melena, hematochezia, dysuria, hematuria, rash, seizure activity, orthopnea, PND, pedal edema, claudication. Remaining systems are negative.  Physical Exam: Well-developed  well-nourished in no acute distress.  Skin is warm and dry.  HEENT is normal.  Neck is supple.  Chest is clear to auscultation with normal expansion.  Cardiovascular exam is regular rate and rhythm.  Abdominal exam nontender or distended. No masses palpated. Extremities show no edema. neuro grossly intact  ECG 09/03/2014-sinus rhythm,left anterior fascicular block, RV conduction delay.

## 2014-10-13 ENCOUNTER — Ambulatory Visit (INDEPENDENT_AMBULATORY_CARE_PROVIDER_SITE_OTHER): Payer: Medicare Other | Admitting: Cardiology

## 2014-10-13 ENCOUNTER — Encounter: Payer: Self-pay | Admitting: Cardiology

## 2014-10-13 VITALS — BP 110/60 | HR 72 | Ht 63.0 in | Wt 191.4 lb

## 2014-10-13 DIAGNOSIS — I639 Cerebral infarction, unspecified: Secondary | ICD-10-CM | POA: Diagnosis not present

## 2014-10-13 DIAGNOSIS — I10 Essential (primary) hypertension: Secondary | ICD-10-CM

## 2014-10-13 DIAGNOSIS — E785 Hyperlipidemia, unspecified: Secondary | ICD-10-CM

## 2014-10-13 DIAGNOSIS — I5032 Chronic diastolic (congestive) heart failure: Secondary | ICD-10-CM | POA: Diagnosis not present

## 2014-10-13 DIAGNOSIS — I4891 Unspecified atrial fibrillation: Secondary | ICD-10-CM

## 2014-10-13 DIAGNOSIS — I2510 Atherosclerotic heart disease of native coronary artery without angina pectoris: Secondary | ICD-10-CM

## 2014-10-13 NOTE — Assessment & Plan Note (Signed)
Continue aspirin and statin. 

## 2014-10-13 NOTE — Assessment & Plan Note (Signed)
Patient with recent CVA. He is scheduled to see neurology later this month. If they feel indicated we would be happy to arrange transesophageal echocardiogram and implantable loop recorder to screen for atrial fibrillation.

## 2014-10-13 NOTE — Assessment & Plan Note (Signed)
Patient carries the diagnosis of paroxysmal atrial fibrillation but this apparently occurred in the distant past postoperatively. No recent documentation.

## 2014-10-13 NOTE — Assessment & Plan Note (Signed)
Blood pressure controlled. Continue present medications. 

## 2014-10-13 NOTE — Assessment & Plan Note (Addendum)
euvolemic on examination.continue present dose of diuretic.

## 2014-10-13 NOTE — Assessment & Plan Note (Signed)
Continue statin. 

## 2014-10-13 NOTE — Patient Instructions (Signed)
Your physician wants you to follow-up in: 6 MONTHS WITH DR CRENSHAW You will receive a reminder letter in the mail two months in advance. If you don't receive a letter, please call our office to schedule the follow-up appointment.  

## 2014-10-26 ENCOUNTER — Other Ambulatory Visit: Payer: Self-pay | Admitting: Family Medicine

## 2014-11-04 ENCOUNTER — Ambulatory Visit (INDEPENDENT_AMBULATORY_CARE_PROVIDER_SITE_OTHER): Payer: Medicare Other | Admitting: Neurology

## 2014-11-04 ENCOUNTER — Encounter: Payer: Self-pay | Admitting: Neurology

## 2014-11-04 VITALS — BP 132/59 | HR 63 | Ht 63.0 in | Wt 192.4 lb

## 2014-11-04 DIAGNOSIS — R002 Palpitations: Secondary | ICD-10-CM | POA: Diagnosis not present

## 2014-11-04 DIAGNOSIS — I10 Essential (primary) hypertension: Secondary | ICD-10-CM | POA: Diagnosis not present

## 2014-11-04 DIAGNOSIS — E118 Type 2 diabetes mellitus with unspecified complications: Secondary | ICD-10-CM | POA: Insufficient documentation

## 2014-11-04 DIAGNOSIS — Z951 Presence of aortocoronary bypass graft: Secondary | ICD-10-CM | POA: Diagnosis not present

## 2014-11-04 DIAGNOSIS — I639 Cerebral infarction, unspecified: Secondary | ICD-10-CM | POA: Diagnosis not present

## 2014-11-04 NOTE — Patient Instructions (Signed)
-   continue ASA and lipitor for stroke prevention - will do TCD emboli detection and 30 day cardiac monitoring - discontinue depakote - Follow up with your primary care physician for stroke risk factor modification. Recommend maintain blood pressure goal <130/80, diabetes with hemoglobin A1c goal below 6.5% and lipids with LDL cholesterol goal below 70 mg/dL.  - According to [place] law, you can not drive until seizure free for 6 months and under physician's care.  - Please maintain seizure precautions. Do not participate in activities where a loss of awareness could hurt you or someone else.  No swimming alone, no tub bathing, no hot tubs, no driving, no operating motorized vehicles(cars, ATVs, motorbikes, etc), lawnmowers or power tools.  No standing at heights, such as rooftops, ladders or stairs. No sliding boards, monkey bars or swings, or climbing trees.  Avoid hot objects such as stoves, heaters, open fires.  Wear a helmet when riding a bicycle, scooter, skateboard, etc. and avoid areas of traffic.  Set water heater to 120 degrees or less. - check BP and glucose at home.  - continue to follow up with cardiology regularly. - follow up in 4 months.

## 2014-11-04 NOTE — Progress Notes (Signed)
STROKE NEUROLOGY FOLLOW UP NOTE  NAME: Fred Fred [name] DOB: [birth year]  REASON FOR VISIT: stroke follow up HISTORY FROM: daughter on Fred phone and chart  Today we had Fred pleasure of seeing Fred Fred [name] in follow-up at our Neurology Clinic. Pt was accompanied by wife with daughter on Fred phone.   History Summary Fred Fred [name] is a 79 y.o. male with history ofHTN, DM, CAD s/p CABG with post operative atrial fibrillation, CHF, PVD, previous stroke, complex partial seizure disorder presenting with left facial droop, mild left hemiparesis, slurred speech, staring off, mumbling and confusion on 09/03/2014 . He did not receive IV t-PA due to rapidly resolving deficits. MRI showed acute small infarct and right posterior frontal lobe and remote small thalamic infarcts. Stroke workup unremarkable including CUS, and LDL, but A1c 7.3 and EF 45-50%. He was continued on aspirin, Lipitor, Zetia and Lantus. Due to questionable seizure, he was put on Depakote ER 500 mg daily. He was discharged in good condition.  As per daughter, Fred [name] had CABG done in December 2015, had A. fib after procedure, short duration, no treatment. Fred [name] denies any palpitation. However, at that time due to episode of confusion, concerning for complex partial seizure, EEG was done, not sure about report. MRI done also showed remote strokes.  Interval History During Fred interval time, Fred Fred [name] has been doing well. No residual deficit, no recurrent stroke symptoms. Blood pressure 135/59. Fred [name] questioning whether he should continue Depakote for seizure treatment. He checked glucose at home, stable. However, he was not taking blood pressure regularly at home.   REVIEW OF SYSTEMS: Full 14 system review of systems performed and notable only for those listed below and in HPI above, all others are negative:  Constitutional:   Cardiovascular:  Ear/Nose/Throat:   Skin:  Eyes:   Respiratory:   Gastroitestinal:     Genitourinary:  Hematology/Lymphatic:   Endocrine:  Musculoskeletal:   Allergy/Immunology:   Neurological:   Psychiatric:  Sleep:   Fred following represents Fred Fred [name]'s updated allergies and side effects list: Allergies  Allergen Reactions  . Lisinopril Cough    Fred neurologically relevant items on Fred Fred [name]'s problem list were reviewed on today's visit.  Neurologic Examination  A problem focused neurological exam (12 or more points of Fred single system neurologic examination, vital signs counts as 1 point, cranial nerves count for 8 points) was performed.  Blood pressure 132/59, pulse 63, height 5\' 3"  (1.6 m), weight 192 lb 6.4 oz (87.272 kg).  General - Well nourished, well developed, in no apparent distress.  Ophthalmologic - Sharp disc margins OU.   Cardiovascular - Regular rate and rhythm with no murmur.  Mental Status -  Level of arousal and orientation to time, place, and person were intact. Language including expression, naming, repetition, comprehension was assessed and found intact. Fund of Knowledge was assessed and was intact.  Cranial Nerves II - XII - II - Visual field intact OU. III, IV, VI - Extraocular movements intact. V - Facial sensation intact bilaterally. VII - Facial movement intact bilaterally. VIII - Hearing & vestibular intact bilaterally. X - Palate elevates symmetrically. XI - Chin turning & shoulder shrug intact bilaterally. XII - Tongue protrusion intact.  Motor Strength - Fred Fred [name]'s strength was normal in all extremities and pronator drift was absent.  Bulk was normal and fasciculations were absent.   Motor Tone - Muscle tone was assessed at Fred neck and appendages and was normal.  Reflexes - Fred Fred [name]'s  reflexes were 1+ in all extremities and he had no pathological reflexes.  Sensory - Light touch, temperature/pinprick were assessed and were normal.    Coordination - Fred Fred [name] had normal movements in Fred hands and feet  with no ataxia or dysmetria.  Tremor was absent.  Gait and Station - Fred Fred [name]'s transfers, posture, gait, station, and turns were observed as normal.  Data reviewed: I personally reviewed Fred images and agree with Fred radiology interpretations.  EEG - Normal electroencephalogram, awake, drpwsy and with activation procedures. There are no focal lateralizing or epileptiform features.  Ct Head Wo Contrast 09/03/2014  Chronic small vessel ischemic changes. No acute intracranial process.   MRI Brain 09/04/2014 Acute small nonhemorrhagic infarct right posterior frontal lobe (motor strip). Remote small thalamic infarcts. Significant small vessel disease type changes. Global atrophy. Slight progression of ventricular prominence may reflect result of central atrophy rather than hydrocephalus.  MRA Head 09/04/2014 Anterior circulation without medium or large size vessel significant stenosis or occlusion. Mild narrowing distal left vertebral artery. Nonvisualized left posterior inferior cerebellar artery and right anterior inferior cerebellar artery. Moderate to marked focal stenosis P1 segment right posteriorcerebral artery. Mild to moderate narrowing right superior cerebellar artery.  CUS - Bilateral: mild to moderate calcific plaque origin ICA. 1-39% ICA stenosis. Vertebral artery flow is antegrade.  2D echo - - Left ventricle: Fred cavity size was normal. Wall thickness was increased in a pattern of mild LVH. Systolic function was mildly reduced. Fred estimated ejection fraction was in Fred range of 45% to 50%. Wall motion was normal; there were no regional wall motion abnormalities. Doppler parameters are consistent with abnormal left ventricular relaxation (grade 1 diastolic dysfunction). - Mitral valve: Calcified annulus. - Right ventricle: Systolic function was mildly reduced.  Component     Latest Ref Rng 09/04/2014  Cholesterol     0 - 200 mg/dL 97    Triglycerides     <150 mg/dL 98  HDL Cholesterol     >40 mg/dL 27 (L)  Total CHOL/HDL Ratio      3.6  VLDL     0 - 40 mg/dL 20  LDL (calc)     0 - 99 mg/dL 50  Hemoglobin A1C     4.8 - 5.6 % 7.3 (H)  Mean Plasma Glucose      163    Assessment: As you may recall, he is a 79 y.o. Caucasian male with PMH of HTN, DM, CAD s/p CABG with post operative atrial fibrillation, CHF, PVD, previous stroke, complex partial seizure disorder was admitted on 09/03/2014 for acute small infarct and right posterior frontal lobe and remote small thalamic infarcts. Stroke location suspicious for embolic pattern, especially in Fred setting of previously post operative A. fib. Will do TCD emboli detection and 30 day cardiac event monitoring. Stroke workup unremarkable including CUS, and LDL, but A1c 7.3 and EF 45-50%. He was put on Depakote ER 500 mg daily during admission, however, EEG negative and Fred episode is really questionable for seizure presentation. Will discontinue Depakote and observe. Currently, Fred [name] is doing well without neurological deficit.  Plan:  - continue ASA and lipitor for stroke prevention - will do TCD emboli detection and 30 day cardiac monitoring - discontinue depakote - Follow up with your primary care physician for stroke risk factor modification. Recommend maintain blood pressure goal <130/80, diabetes with hemoglobin A1c goal below 6.5% and lipids with LDL cholesterol goal below 70 mg/dL.  - According to [place] law, you can not  drive until seizure free for 6 months and under physician's care.  - Please maintain seizure precautions. - check BP and glucose at home.  - continue to follow up with cardiology regularly. - RTC in 4 months for evaluation of driving privileges.   Orders Placed This Encounter  Procedures  . [place] TCD WITHMONITORING    Standing Status: Future     Number of Occurrences:      Standing Expiration Date: 05/07/2015    Order Specific Question:  Reason for Exam  (SYMPTOM  OR DIAGNOSIS REQUIRED)    Answer:  Embolic stroke    Order Specific Question:  Preferred imaging location?    Answer:  Internal  . Cardiac event monitor    Standing Status: Future     Number of Occurrences:      Standing Expiration Date: 11/05/2015    Scheduling Instructions:     Request cardionet setup. Thank you.    Order Specific Question:  Where should this test be performed?    Answer:  [facility]    No orders of Fred defined types were placed in this encounter.    Fred [name] Instructions  - continue ASA and lipitor for stroke prevention - will do TCD emboli detection and 30 day cardiac monitoring - discontinue depakote - Follow up with your primary care physician for stroke risk factor modification. Recommend maintain blood pressure goal <130/80, diabetes with hemoglobin A1c goal below 6.5% and lipids with LDL cholesterol goal below 70 mg/dL.  - According to [place] law, you can not drive until seizure free for 6 months and under physician's care.  - Please maintain seizure precautions. Do not participate in activities where a loss of awareness could hurt you or someone else.  No swimming alone, no tub bathing, no hot tubs, no driving, no operating motorized vehicles(cars, ATVs, motorbikes, etc), lawnmowers or power tools.  No standing at heights, such as rooftops, ladders or stairs. No sliding boards, monkey bars or swings, or climbing trees.  Avoid hot objects such as stoves, heaters, open fires.  Wear a helmet when riding a bicycle, scooter, skateboard, etc. and avoid areas of traffic.  Set water heater to 120 degrees or less. - check BP and glucose at home.  - continue to follow up with cardiology regularly. - follow up in 4 months.      Rosalin Hawking, MD PhD [facility] [address], [place], [place] [address] [phone]

## 2014-11-07 ENCOUNTER — Ambulatory Visit (INDEPENDENT_AMBULATORY_CARE_PROVIDER_SITE_OTHER): Payer: Medicare Other

## 2014-11-07 ENCOUNTER — Other Ambulatory Visit: Payer: Self-pay | Admitting: Family Medicine

## 2014-11-07 DIAGNOSIS — R002 Palpitations: Secondary | ICD-10-CM | POA: Diagnosis not present

## 2014-11-13 ENCOUNTER — Ambulatory Visit (INDEPENDENT_AMBULATORY_CARE_PROVIDER_SITE_OTHER): Payer: Medicare Other

## 2014-11-13 ENCOUNTER — Other Ambulatory Visit: Payer: Self-pay

## 2014-11-13 DIAGNOSIS — I639 Cerebral infarction, unspecified: Secondary | ICD-10-CM | POA: Diagnosis not present

## 2014-11-13 MED ORDER — FUROSEMIDE 40 MG PO TABS: ORAL_TABLET | ORAL | Status: DC

## 2014-11-13 NOTE — Telephone Encounter (Signed)
Lelon Perla, MD at 10/09/2014 5:23 PM   furosemide (LASIX) 40 MG tablet TAKE 1 TABLET BY MOUTH EVERY DAY PRN FOR FLUID         Congestive heart failure - Lelon Perla, MD at 10/13/2014 11:14 AM     Status: Alison Stalling Related Problem: Congestive heart failure   Expand All Collapse All   euvolemic on examination.continue present dose of diuretic

## 2014-11-29 ENCOUNTER — Other Ambulatory Visit: Payer: Self-pay | Admitting: Family Medicine

## 2014-12-04 ENCOUNTER — Ambulatory Visit: Payer: Medicare Other | Admitting: Family Medicine

## 2014-12-15 ENCOUNTER — Telehealth: Payer: Self-pay

## 2014-12-15 NOTE — Telephone Encounter (Signed)
Called patient. Gave results. Patient verbalized understanding.    

## 2014-12-15 NOTE — Telephone Encounter (Signed)
-----   Message from Rosalin Hawking, MD sent at 12/12/2014  5:20 PM EDT ----- Hi, Katrina:  Please let the pt know that his 30 day cardiac event monitoring did not show any afib episode. No change of medication. Continue current treatment plan. Thanks.  Rosalin Hawking, MD PhD Stroke Neurology 12/12/2014 5:20 PM

## 2014-12-18 ENCOUNTER — Ambulatory Visit (INDEPENDENT_AMBULATORY_CARE_PROVIDER_SITE_OTHER): Payer: Medicare Other | Admitting: Family Medicine

## 2014-12-18 ENCOUNTER — Encounter: Payer: Self-pay | Admitting: Family Medicine

## 2014-12-18 VITALS — BP 111/57 | HR 72 | Temp 98.4°F | Wt 189.0 lb

## 2014-12-18 DIAGNOSIS — I639 Cerebral infarction, unspecified: Secondary | ICD-10-CM | POA: Diagnosis not present

## 2014-12-18 DIAGNOSIS — Z23 Encounter for immunization: Secondary | ICD-10-CM | POA: Diagnosis not present

## 2014-12-18 DIAGNOSIS — E119 Type 2 diabetes mellitus without complications: Secondary | ICD-10-CM

## 2014-12-18 LAB — POCT UA - MICROALBUMIN
Creatinine, POC: 50 mg/dL
Microalbumin Ur, POC: 10 mg/L

## 2014-12-18 LAB — POCT GLYCOSYLATED HEMOGLOBIN (HGB A1C): Hemoglobin A1C: 6.9

## 2014-12-18 NOTE — Progress Notes (Signed)
   Subjective:    Patient ID: Fred [name], male    DOB: 1935/06/05, 79 y.o.   MRN: [id number]  HPI Diabetes - no hypoglycemic events. No wounds or sores that are not healing well. No increased thirst or urination. Checking glucose at home. Taking medications as prescribed without any side effects.  Doing yardwork   Congestive heart failure- No CP or SOB.  No swelling. Sinc lost a little weigh his swelling has been better.   Hypertension- Pt denies chest pain, SOB, dizziness, or heart palpitations.  Taking meds as directed w/o problems.  Denies medication side effects.      Review of Systems     Objective:   Physical Exam  Constitutional: He is oriented to person, place, and time. He appears well-developed and well-nourished.  HENT:  Head: Normocephalic and atraumatic.  Cardiovascular: Normal rate, regular rhythm and normal heart sounds.   Pulmonary/Chest: Effort normal and breath sounds normal.  Musculoskeletal: He exhibits no edema.  Neurological: He is alert and oriented to person, place, and time.  Skin: Skin is warm and dry.  Psychiatric: He has a normal mood and affect. His behavior is normal.          Assessment & Plan:  Diabetes-doing fantastic. Last A1c was 73 units down to 6.9 today. He is up-to-date on his eye exam and we called get the report. Eye exam is up-to-date. Urine micro albumen performed today.  Hypertension-well-controlled. Continue current regimen.  Congestive heart failure-stable doing well no sign of volume overloaded today. He is asymptomatic. Blood work is up-to-date.  Flu vaccine given today.

## 2014-12-21 ENCOUNTER — Other Ambulatory Visit: Payer: Self-pay | Admitting: Family Medicine

## 2014-12-25 ENCOUNTER — Telehealth: Payer: Self-pay

## 2014-12-25 NOTE — Telephone Encounter (Signed)
-----   Message from Rosalin Hawking, MD sent at 12/11/2014  4:54 PM EDT ----- Hi, Fred [name]:  Could you please let the pt know that his 30 day cardiac event monitoring showed no afib episodes. Continue current treatment plan. Thanks.  Rosalin Hawking, MD PhD Stroke Neurology 12/11/2014 4:55 PM

## 2014-12-25 NOTE — Telephone Encounter (Signed)
Lft vm for patient to call back about 30 day event monitoring results.

## 2014-12-29 NOTE — Telephone Encounter (Signed)
-----   Message from Rosalin Hawking, MD sent at 12/11/2014  4:54 PM EDT ----- Hi, Katrina:  Could you please let the pt know that his 30 day cardiac event monitoring showed no afib episodes. Continue current treatment plan. Thanks.  Rosalin Hawking, MD PhD Stroke Neurology 12/11/2014 4:55 PM

## 2014-12-29 NOTE — Telephone Encounter (Signed)
Lf voice for patient to cal back about 30 day event monitoring results.

## 2014-12-31 NOTE — Telephone Encounter (Signed)
Rn sent patient a letter regarding his 30 day monitoring results.

## 2015-01-11 ENCOUNTER — Other Ambulatory Visit: Payer: Self-pay | Admitting: Family Medicine

## 2015-03-20 ENCOUNTER — Ambulatory Visit (INDEPENDENT_AMBULATORY_CARE_PROVIDER_SITE_OTHER): Payer: Medicare Other | Admitting: Family Medicine

## 2015-03-20 ENCOUNTER — Encounter: Payer: Self-pay | Admitting: Family Medicine

## 2015-03-20 VITALS — BP 137/60 | HR 67 | Temp 98.4°F | Resp 16 | Ht 62.0 in | Wt 191.1 lb

## 2015-03-20 DIAGNOSIS — I4891 Unspecified atrial fibrillation: Secondary | ICD-10-CM

## 2015-03-20 DIAGNOSIS — M545 Low back pain, unspecified: Secondary | ICD-10-CM

## 2015-03-20 DIAGNOSIS — E119 Type 2 diabetes mellitus without complications: Secondary | ICD-10-CM

## 2015-03-20 DIAGNOSIS — I2510 Atherosclerotic heart disease of native coronary artery without angina pectoris: Secondary | ICD-10-CM

## 2015-03-20 DIAGNOSIS — I10 Essential (primary) hypertension: Secondary | ICD-10-CM

## 2015-03-20 LAB — BASIC METABOLIC PANEL WITH GFR
BUN: 12 mg/dL (ref 7–25)
CO2: 26 mmol/L (ref 20–31)
CREATININE: 1.02 mg/dL (ref 0.70–1.18)
Calcium: 9.6 mg/dL (ref 8.6–10.3)
Chloride: 106 mmol/L (ref 98–110)
GFR, Est African American: 82 mL/min (ref >=60)
GFR, Est Non African American: 71 mL/min (ref >=60)
GLUCOSE: 59 mg/dL — AB (ref 65–99)
Potassium: 4.6 mmol/L (ref 3.5–5.3)
SODIUM: 140 mmol/L (ref 135–146)

## 2015-03-20 MED ORDER — METFORMIN HCL 1000 MG PO TABS: 1000.0000 mg | ORAL_TABLET | Freq: Two times a day (BID) | ORAL | Status: DC

## 2015-03-20 MED ORDER — NIACIN ER (ANTIHYPERLIPIDEMIC) 500 MG PO TBCR: 500.0000 mg | EXTENDED_RELEASE_TABLET | Freq: Every day | ORAL | Status: DC

## 2015-03-20 NOTE — Progress Notes (Signed)
Quick Note:  All labs are normal. ______ 

## 2015-03-20 NOTE — Progress Notes (Signed)
   Subjective:    Patient ID: Fred [name], male    DOB: October 12, 1935, 80 y.o.   MRN: [id number]  HPI Diabetes - Has had aobut 3 hypoglycemic events. No wounds or sores that are not healing well. No increased thirst or urination. Checking glucose at home. Taking medications as prescribed without any side effects.  CAD - No CP or SOB.    Hypertension- Pt denies chest pain, SOB, dizziness, or heart palpitations.  Taking meds as directed w/o problems.  Denies medication side effects.    Afib  - No CP or SOB.   Left mid back pain started about 2 weeks ago after started lifting some wook in the yard.  Has gone to bed early bc of it. Took couple of Aleve- it did help.     Review of Systems     Objective:   Physical Exam  Constitutional: He is oriented to person, place, and time. He appears well-developed and well-nourished.  HENT:  Head: Normocephalic and atraumatic.  Cardiovascular: Normal rate, regular rhythm and normal heart sounds.   Pulmonary/Chest: Effort normal and breath sounds normal.  Neurological: He is alert and oriented to person, place, and time.  Skin: Skin is warm and dry.  Psychiatric: He has a normal mood and affect. His behavior is normal.          Assessment & Plan:  DM- well controlled. A1C is 6.3.  We'll decrease his Lantus to 8 units down from 10 since he has had about 3-4 hypoglycemic episodes since I last saw him. His eye exam is up-to-date. He went last month. We will call Dr. Jordan Likes office at [facility] surgeons to get a copy of his report. Follow-up in 3 months.  HTN - controlled. Continue current regimen.  CAD- stable. No recent symptoms. He is on an aspirin, beta blocker, statin.    Left low back pain, no radiculopathy - most consistent with muscular skeletal strain. No sign of radiculopathy. Recommend Aleve as needed. She'll take with food and water to avoid any GI upset or irritation. Recommend gentle stretches. Handout provided. And use a heating  pad etc. If not improving in the next 2-3 weeks and please let me know. Avoid any heavy lifting for the next couple weeks if possible.

## 2015-03-20 NOTE — Patient Instructions (Signed)
Decrease Lantus to 8 units.

## 2015-03-23 ENCOUNTER — Ambulatory Visit: Payer: Medicare Other | Admitting: Neurology

## 2015-03-27 ENCOUNTER — Ambulatory Visit (INDEPENDENT_AMBULATORY_CARE_PROVIDER_SITE_OTHER): Payer: Medicare Other | Admitting: Neurology

## 2015-03-27 ENCOUNTER — Encounter: Payer: Self-pay | Admitting: Neurology

## 2015-03-27 VITALS — BP 119/66 | HR 59 | Wt 189.8 lb

## 2015-03-27 DIAGNOSIS — I10 Essential (primary) hypertension: Secondary | ICD-10-CM

## 2015-03-27 DIAGNOSIS — Z951 Presence of aortocoronary bypass graft: Secondary | ICD-10-CM

## 2015-03-27 DIAGNOSIS — E118 Type 2 diabetes mellitus with unspecified complications: Secondary | ICD-10-CM | POA: Diagnosis not present

## 2015-03-27 DIAGNOSIS — Z794 Long term (current) use of insulin: Secondary | ICD-10-CM | POA: Diagnosis not present

## 2015-03-27 DIAGNOSIS — I639 Cerebral infarction, unspecified: Secondary | ICD-10-CM | POA: Diagnosis not present

## 2015-03-27 DIAGNOSIS — I2510 Atherosclerotic heart disease of native coronary artery without angina pectoris: Secondary | ICD-10-CM | POA: Diagnosis not present

## 2015-03-27 NOTE — Patient Instructions (Signed)
-   continue ASA and lipitor for stroke prevention - will find out the result of TCD emboli detection and let you know - Follow up with your primary care physician for stroke risk factor modification. Recommend maintain blood pressure goal <130/80, diabetes with hemoglobin A1c goal below 6.5% and lipids with LDL cholesterol goal below 70 mg/dL.  - check BP and glucose at home.  - able to drive now since you have seizure free for 6 months - continue to follow up with cardiology regularly. - follow up in 6 months.

## 2015-03-27 NOTE — Progress Notes (Signed)
STROKE NEUROLOGY FOLLOW UP NOTE  NAME: Fred [name] DOB: 02/11/1936  REASON FOR VISIT: stroke follow up HISTORY FROM: daughter on the phone and chart  Today we had the pleasure of seeing Fred [name] in follow-up at our Neurology Clinic. Pt was accompanied by wife with daughter on the phone.   History Summary [name] is a 80 y.o. male with history ofHTN, DM, CAD s/p CABG with post operative atrial fibrillation, CHF, PVD, previous stroke, complex partial seizure disorder presenting with left facial droop, mild left hemiparesis, slurred speech, staring off, mumbling and confusion on 09/03/2014 . He did not receive IV t-PA due to rapidly resolving deficits. MRI showed acute small infarct and right posterior frontal lobe and remote small thalamic infarcts. Stroke workup unremarkable including CUS, and LDL, but A1c 7.3 and EF 45-50%. He was continued on aspirin, Lipitor, Zetia and Lantus. Due to questionable seizure, he was put on Depakote ER 500 mg daily. He was discharged in good condition.  As per daughter, patient had CABG done in December 2015, had A. fib after procedure, short duration, no treatment. Patient denies any palpitation. However, at that time due to episode of confusion, concerning for complex partial seizure, EEG was done, not sure about report. MRI done also showed remote strokes.  11/04/14 follow up - the patient has been doing well. No residual deficit, no recurrent stroke symptoms. Blood pressure 135/59. Patient questioning whether he should continue Depakote for seizure treatment. He checked glucose at home, stable. However, he was not taking blood pressure regularly at home.   Interval History During the interval time, pt has been doing well. No seizures. And he has been off depakote for 5 months. Actually he has been back to drive as his wife not able to drive anymore due to neuropathy. No car accident or any issues. 30 day cardiac event monitoring did  not show afib. Recent A1C was below 6 as per pt. BP today 119/66.   REVIEW OF SYSTEMS: Full 14 system review of systems performed and notable only for those listed below and in HPI above, all others are negative:  Constitutional:   Cardiovascular:  Ear/Nose/Throat:   Skin:  Eyes:   Respiratory:   Gastroitestinal:   Genitourinary:  Hematology/Lymphatic:   Endocrine:  Musculoskeletal:   Allergy/Immunology:   Neurological:   Psychiatric:  Sleep:   The following represents the patient's updated allergies and side effects list: Allergies  Allergen Reactions  . Lisinopril Cough    The neurologically relevant items on the patient's problem list were reviewed on today's visit.  Neurologic Examination  A problem focused neurological exam (12 or more points of the single system neurologic examination, vital signs counts as 1 point, cranial nerves count for 8 points) was performed.  Blood pressure 119/66, pulse 59, weight 189 lb 12.8 oz (86.093 kg).  General - Well nourished, well developed, in no apparent distress.  Ophthalmologic - Sharp disc margins OU.   Cardiovascular - Regular rate and rhythm with no murmur.  Mental Status -  Level of arousal and orientation to time, place, and person were intact. Language including expression, naming, repetition, comprehension was assessed and found intact. Fund of Knowledge was assessed and was intact.  Cranial Nerves II - XII - II - Visual field intact OU. III, IV, VI - Extraocular movements intact. V - Facial sensation intact bilaterally. VII - Facial movement intact bilaterally. VIII - Hearing & vestibular intact bilaterally. X - Palate elevates symmetrically. XI -  Chin turning & shoulder shrug intact bilaterally. XII - Tongue protrusion intact.  Motor Strength - The patient's strength was normal in all extremities and pronator drift was absent.  Bulk was normal and fasciculations were absent.   Motor Tone - Muscle tone was  assessed at the neck and appendages and was normal.  Reflexes - The patient's reflexes were 1+ in all extremities and he had no pathological reflexes.  Sensory - Light touch, temperature/pinprick were assessed and were normal.    Coordination - The patient had normal movements in the hands and feet with no ataxia or dysmetria.  Tremor was absent.  Gait and Station - The patient's transfers, posture, gait, station, and turns were observed as normal.  Data reviewed: I personally reviewed the images and agree with the radiology interpretations.  EEG - Normal electroencephalogram, awake, drpwsy and with activation procedures. There are no focal lateralizing or epileptiform features.  Ct Head Wo Contrast 09/03/2014  Chronic small vessel ischemic changes. No acute intracranial process.   MRI Brain 09/04/2014 Acute small nonhemorrhagic infarct right posterior frontal lobe (motor strip). Remote small thalamic infarcts. Significant small vessel disease type changes. Global atrophy. Slight progression of ventricular prominence may reflect result of central atrophy rather than hydrocephalus.  MRA Head 09/04/2014 Anterior circulation without medium or large size vessel significant stenosis or occlusion. Mild narrowing distal left vertebral artery. Nonvisualized left posterior inferior cerebellar artery and right anterior inferior cerebellar artery. Moderate to marked focal stenosis P1 segment right posteriorcerebral artery. Mild to moderate narrowing right superior cerebellar artery.  CUS - Bilateral: mild to moderate calcific plaque origin ICA. 1-39% ICA stenosis. Vertebral artery flow is antegrade.  2D echo - - Left ventricle: The cavity size was normal. Wall thickness was increased in a pattern of mild LVH. Systolic function was mildly reduced. The estimated ejection fraction was in the range of 45% to 50%. Wall motion was normal; there were no regional wall motion  abnormalities. Doppler parameters are consistent with abnormal left ventricular relaxation (grade 1 diastolic dysfunction). - Mitral valve: Calcified annulus. - Right ventricle: Systolic function was mildly reduced.  30 day cardiac monitoring - no afib  TCD MES - pending result  Component     Latest Ref Rng 09/04/2014  Cholesterol     0 - 200 mg/dL 97  Triglycerides     <150 mg/dL 98  HDL Cholesterol     >40 mg/dL 27 (L)  Total CHOL/HDL Ratio      3.6  VLDL     0 - 40 mg/dL 20  LDL (calc)     0 - 99 mg/dL 50  Hemoglobin A1C     4.8 - 5.6 % 7.3 (H)  Mean Plasma Glucose      163    Assessment: As you may recall, he is a 80 y.o. Caucasian male with PMH of HTN, DM, CAD s/p CABG with post operative atrial fibrillation, CHF, PVD, previous stroke, complex partial seizure disorder was admitted on 09/03/2014 for acute small infarct at right posterior frontal lobe and remote small thalamic infarcts. Stroke location suspicious for embolic pattern, especially in the setting of previously post operative A. Fib.  Other stroke workup unremarkable including CUS, and LDL, but A1c 7.3 and EF 45-50%. He was put on Depakote ER 500 mg daily during admission, however, EEG negative and the episode is really questionable for seizure presentation. Depakote was discontinued and no more seizure during interval time. TCD emboli detection report pending but 30 day cardiac  event monitoring showed no afib. Currently, patient is doing well without neurological deficit. BP and DM controlled well.  Plan:  - continue ASA and lipitor for stroke prevention - pending result of TCD emboli detection - Follow up with your primary care physician for stroke risk factor modification. Recommend maintain blood pressure goal <130/80, diabetes with hemoglobin A1c goal below 6.5% and lipids with LDL cholesterol goal below 70 mg/dL.  - check BP and glucose at home.  - able to drive now as seizure free for more than 6  months - continue to follow up with cardiology regularly. - follow up in 6 months.  No orders of the defined types were placed in this encounter.    No orders of the defined types were placed in this encounter.    Patient Instructions  - continue ASA and lipitor for stroke prevention - will find out the result of TCD emboli detection and let you know - Follow up with your primary care physician for stroke risk factor modification. Recommend maintain blood pressure goal <130/80, diabetes with hemoglobin A1c goal below 6.5% and lipids with LDL cholesterol goal below 70 mg/dL.  - check BP and glucose at home.  - able to drive now since you have seizure free for 6 months - continue to follow up with cardiology regularly. - follow up in 6 months.    Rosalin Hawking, MD PhD [facility] [address], [place],  [address] [phone]

## 2015-03-31 ENCOUNTER — Telehealth: Payer: Self-pay

## 2015-03-31 NOTE — Telephone Encounter (Signed)
LFt vm for patient about patients TCD ultrasound.

## 2015-03-31 NOTE — Telephone Encounter (Signed)
-----   Message from Rosalin Hawking, MD sent at 03/31/2015 11:54 AM EST ----- Could you please let the patient know that the brain ultrasound test done in 11/2014 in our office was negative for any clot travelling to the brain. Please continue current treatment plan. Thanks.  Rosalin Hawking, MD PhD Stroke Neurology 03/31/2015 11:54 AM

## 2015-04-07 NOTE — Telephone Encounter (Signed)
Rn call patient to notified him that the TCD was negative, for any clot traveling to the brain. Pt verbalized understanding.

## 2015-04-09 ENCOUNTER — Other Ambulatory Visit: Payer: Self-pay | Admitting: Family Medicine

## 2015-04-13 ENCOUNTER — Other Ambulatory Visit: Payer: Self-pay | Admitting: Family Medicine

## 2015-04-15 ENCOUNTER — Other Ambulatory Visit: Payer: Self-pay | Admitting: *Deleted

## 2015-04-15 MED ORDER — FUROSEMIDE 40 MG PO TABS: ORAL_TABLET | ORAL | Status: DC

## 2015-04-22 ENCOUNTER — Ambulatory Visit (INDEPENDENT_AMBULATORY_CARE_PROVIDER_SITE_OTHER): Payer: Medicare Other

## 2015-04-22 ENCOUNTER — Ambulatory Visit (INDEPENDENT_AMBULATORY_CARE_PROVIDER_SITE_OTHER): Payer: Medicare Other | Admitting: Osteopathic Medicine

## 2015-04-22 ENCOUNTER — Encounter: Payer: Self-pay | Admitting: Osteopathic Medicine

## 2015-04-22 VITALS — BP 117/58 | HR 84 | Temp 98.4°F | Ht 63.0 in | Wt 192.0 lb

## 2015-04-22 DIAGNOSIS — J069 Acute upper respiratory infection, unspecified: Secondary | ICD-10-CM

## 2015-04-22 DIAGNOSIS — I517 Cardiomegaly: Secondary | ICD-10-CM

## 2015-04-22 DIAGNOSIS — R05 Cough: Secondary | ICD-10-CM | POA: Diagnosis not present

## 2015-04-22 DIAGNOSIS — R059 Cough, unspecified: Secondary | ICD-10-CM

## 2015-04-22 DIAGNOSIS — I639 Cerebral infarction, unspecified: Secondary | ICD-10-CM

## 2015-04-22 DIAGNOSIS — B9789 Other viral agents as the cause of diseases classified elsewhere: Secondary | ICD-10-CM

## 2015-04-22 MED ORDER — IPRATROPIUM BROMIDE 0.03 % NA SOLN: 2.0000 | Freq: Two times a day (BID) | NASAL | Status: DC

## 2015-04-22 MED ORDER — GUAIFENESIN-CODEINE 100-10 MG/5ML PO SYRP: 5.0000 mL | ORAL_SOLUTION | Freq: Three times a day (TID) | ORAL | Status: DC | PRN

## 2015-04-22 MED ORDER — BENZONATATE 200 MG PO CAPS: 200.0000 mg | ORAL_CAPSULE | Freq: Three times a day (TID) | ORAL | Status: DC | PRN

## 2015-04-22 NOTE — Progress Notes (Signed)
HPI: Fred [name] is a 80 y.o. male who presents to [facility]  today for chief complaint of:  Chief Complaint  Patient presents with  . Cough    . Location: chest . Quality: cough, congestion . Severity: mild . Duration: 1 days . Context: no sick contacts, no recent travel, no exposure to hospital or nursing home in past 3 mos . Modifying factors: has tried the following OTC medications: DayQuil  without relief    Past medical, social and family history reviewed: Past Medical History  Diagnosis Date  . 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 Hypertension   . CAD (coronary artery disease)     s/p cabg  . CHF (congestive heart failure) ([place])   . Peripheral vascular disease ([place])   . History of stroke   . Hyperlipidemia   . DM (diabetes mellitus) ([place])   . Diverticulosis   . Polyneuropathy, diabetic ([place])   . Seizure disorder, complex partial ([place])   . DJD (degenerative joint disease)   . History of anemia   . Atrial fibrillation ([place])     Post operative  . Syncope and collapse    Past Surgical History  Procedure Laterality Date  . Coronary artery bypass graft  02/13/2004    4 vesel  . Removal of sebaceous cyst from neck  08/2007    Dr. [name]  . Cataract extraction w/ intraocular lens  implant, bilateral  06/2013    Dr. [name]  . Tonsillectomy     Social History  Substance Use Topics  . Smoking status: Former Smoker -- 1.00 packs/day for 22 years    Types: Cigarettes    Quit date: 03/14/1977  . Smokeless tobacco: Never Used  . Alcohol Use: No   Family History  Problem Relation Age of Onset  . Lung cancer    . Epilepsy Father   . Stroke Mother     Current Outpatient Prescriptions  Medication Sig Dispense Refill  . allopurinol (ZYLOPRIM) 300 MG tablet TAKE 1 TABLET BY MOUTH EVERY DAY 90 tablet 4  . aspirin 325 MG tablet Take 325 mg by mouth daily.    Marland Kitchen atorvastatin (LIPITOR) 40 MG tablet TAKE  1 TABLET BY MOUTH EVERY DAY 90 tablet 0  . Coenzyme Q10 (COQ10) 30 MG CAPS Take 1 tablet by mouth daily.      . divalproex (DEPAKOTE ER) 500 MG 24 hr tablet TAKE 1 TABLET BY MOUTH EVERY DAY 30 tablet 0  . furosemide (LASIX) 40 MG tablet TAKE 1 TABLET BY MOUTH EVERY DAY PRN FOR FLUID 90 tablet 1  . glimepiride (AMARYL) 4 MG tablet TAKE 1 TABLET BY MOUTH EVERY DAY BEFORE BREAKFAST 30 tablet 2  . Insulin Glargine (LANTUS SOLOSTAR) 100 UNIT/ML Solostar Pen Inject 10 Units into the skin daily at 10 pm. (Patient taking differently: Inject 8 Units into the skin daily at 10 pm. ) 10 mL 11  . Insulin Pen Needle (PEN NEEDLES) 32G X 4 MM MISC 1 each by Does not apply route daily. 100 each 3  . metFORMIN (GLUCOPHAGE) 1000 MG tablet Take 1 tablet (1,000 mg total) by mouth 2 (two) times daily with a meal. 180 tablet 3  . metoprolol succinate (TOPROL-XL) 50 MG 24 hr tablet TAKE 1 TABLET BY MOUTH EVERY DAY 90 tablet 1  . niacin (NIASPAN) 500 MG CR tablet Take 1 tablet (500 mg total) by mouth at bedtime. 90 tablet 3  . sitaGLIPtin (JANUVIA)  100 MG tablet Take 1 tablet (100 mg total) by mouth daily. 90 tablet 3  . ZETIA 10 MG tablet TAKE 1 TABLET BY MOUTH EVERY DAY 90 tablet 1   No current facility-administered medications for this visit.   Allergies  Allergen Reactions  . Lisinopril Cough      Review of Systems: CONSTITUTIONAL: no fever/chills HEAD/EYES/EARS/NOSE/THROAT: sinus headache, yes vision change or hearing change, no sore throat, (+) sinus pressure and runny nose CARDIAC: No chest pain/pressure/palpitations, no orthopnea RESPIRATORY: yes cough, pt denies but family reports shortness of breath GASTROINTESTINAL: no nausea, no vomiting, no abdominal pain/blood in stool/diarrhea/constipation MUSCULOSKELETAL: no myalgia/arthralgia   Exam:  BP 117/58 mmHg  Pulse 84  Ht 5\' 3"  (1.6 m)  Wt 192 lb (87.091 kg)  BMI 34.02 kg/m2 Constitutional: VSS, see above. General Appearance: alert,  well-developed, well-nourished, NAD Eyes: Normal lids and conjunctive, non-icteric sclera, PERRLA Ears, Nose, Mouth, Throat: Normal external inspection ears/nares/mouth/lips/gums, normal TM, MMM; posterior pharynx without erythema, without exudate Neck: No masses, trachea midline. No thyroid enlargement/tenderness/mass appreciated, normal lymph nodes Respiratory: Normal respiratory effort. (+) on R faint ronchi/rales sounds, no wheeze Cardiovascular: S1/S2 normal, no murmur/rub/gallop auscultated. RRR. No carotid bruit or JVD. No lower extremity edema.   CXR reviewed: 08/19/14 (+) scarring LLL, borderline heart size CXR on personal review today appears stable, question ?increased markings on RML/RLL as scar tissue vs mild edema.  Images were reviewed with the patient. Pt counseled that radiologist will review the images as well, our office will call if the formal read reveals any significant findings other than what has been noted above.    Dg Chest 2 View  04/22/2015  CLINICAL DATA:  Cough, congestion, former smoking history EXAM: CHEST  2 VIEW COMPARISON:  Chest x-ray of 08/19/2014 FINDINGS: No definite active infiltrate or effusion is seen. Mediastinal and hilar contours are unchanged. The heart remains mildly enlarged and median sternotomy sutures are present from prior CABG. No acute bony abnormality is seen. IMPRESSION: Stable mild cardiomegaly.  No active lung disease. Electronically Signed   By: Ivar Drape M.D.   On: 04/22/2015 15:12     ASSESSMENT/PLAN: Acute illness, family is quite worried about the cough but patient states he feels fine. Family reassured after review of Xray, will call if radiologist interpretation changes management plan in any way but for now supportive and symptomatic care, RTC if any change or if no better.   Cough - Plan: DG Chest 2 View  Viral URI with cough - Plan: benzonatate (TESSALON) 200 MG capsule, ipratropium (ATROVENT) 0.03 % nasal spray,  guaiFENesin-codeine (ROBITUSSIN AC) 100-10 MG/5ML syrup    Return if symptoms worsen or fail to improve.

## 2015-04-22 NOTE — Patient Instructions (Addendum)
Upper Respiratory Infection, Adult Most upper respiratory infections (URIs) are a viral infection of the air passages leading to the lungs. A URI affects the nose, throat, and upper air passages. The most common type of URI is nasopharyngitis and is typically referred to as "the common cold." URIs run their course and usually go away on their own. Most of the time, a URI does not require medical attention, but sometimes a bacterial infection in the upper airways can follow a viral infection. This is called a secondary infection. Sinus and middle ear infections are common types of secondary upper respiratory infections. Bacterial pneumonia can also complicate a URI. A URI can worsen asthma and chronic obstructive pulmonary disease (COPD). Sometimes, these complications can require emergency medical care and may be life threatening.  CAUSES Almost all URIs are caused by viruses. A virus is a type of germ and can spread from one person to another.  RISKS FACTORS You may be at risk for a URI if:   You smoke.   You have chronic heart or lung disease.  You have a weakened defense (immune) system.   You are very young or very old.   You have nasal allergies or asthma.  You work in crowded or poorly ventilated areas.  You work in health care facilities or schools. SIGNS AND SYMPTOMS  Symptoms typically develop 2-3 days after you come in contact with a cold virus. Most viral URIs last 7-10 days. However, viral URIs from the influenza virus (flu virus) can last 14-18 days and are typically more severe. Symptoms may include:   Runny or stuffy (congested) nose.   Sneezing.   Cough.   Sore throat.   Headache.   Fatigue.   Fever.   Loss of appetite.   Pain in your forehead, behind your eyes, and over your cheekbones (sinus pain).  Muscle aches.  DIAGNOSIS  Your health care provider may diagnose a URI by:  Physical exam.  Tests to check that your symptoms are not due to  another condition such as:  Strep throat.  Sinusitis.  Pneumonia.  Asthma. TREATMENT  A URI goes away on its own with time. It cannot be cured with medicines, but medicines may be prescribed or recommended to relieve symptoms. Medicines may help:  Reduce your fever.  Reduce your cough.  Relieve nasal congestion. HOME CARE INSTRUCTIONS   Take medicines only as directed by your health care provider.   Gargle warm saltwater or take cough drops to comfort your throat as directed by your health care provider.  Use a warm mist humidifier or inhale steam from a shower to increase air moisture. This may make it easier to breathe.  Drink enough fluid to keep your urine clear or pale yellow.   Eat soups and other clear broths and maintain good nutrition.   Rest as needed.   Return to work when your temperature has returned to normal or as your health care provider advises. You may need to stay home longer to avoid infecting others. You can also use a face mask and careful hand washing to prevent spread of the virus.  Increase the usage of your inhaler if you have asthma.   Do not use any tobacco products, including cigarettes, chewing tobacco, or electronic cigarettes. If you need help quitting, ask your health care provider. PREVENTION  The best way to protect yourself from getting a cold is to practice good hygiene.   Avoid oral or hand contact with people with cold   symptoms.   Wash your hands often if contact occurs.  There is no clear evidence that vitamin C, vitamin E, echinacea, or exercise reduces the chance of developing a cold. However, it is always recommended to get plenty of rest, exercise, and practice good nutrition.  SEEK MEDICAL CARE IF:   You are getting worse rather than better.   Your symptoms are not controlled by medicine.   You have chills.  You have worsening shortness of breath.  You have brown or red mucus.  You have yellow or brown nasal  discharge.  You have pain in your face, especially when you bend forward.  You have a fever.  You have swollen neck glands.  You have pain while swallowing.  You have white areas in the back of your throat. SEEK IMMEDIATE MEDICAL CARE IF:   You have severe or persistent:  Headache.  Ear pain.  Sinus pain.  Chest pain.  You have chronic lung disease and any of the following:  Wheezing.  Prolonged cough.  Coughing up blood.  A change in your usual mucus.  You have a stiff neck.  You have changes in your:  Vision.  Hearing.  Thinking.  Mood. MAKE SURE YOU:   Understand these instructions.  Will watch your condition.  Will get help right away if you are not doing well or get worse.   This information is not intended to replace advice given to you by your health care provider. Make sure you discuss any questions you have with your health care provider.   Document Released: 08/24/2000 Document Revised: 07/15/2014 Document Reviewed: 06/05/2013 Elsevier Interactive Patient Education 2016 Elsevier Inc.  

## 2015-04-29 ENCOUNTER — Ambulatory Visit: Payer: Medicare Other | Admitting: Family Medicine

## 2015-06-10 ENCOUNTER — Other Ambulatory Visit: Payer: Self-pay | Admitting: Osteopathic Medicine

## 2015-06-10 ENCOUNTER — Other Ambulatory Visit: Payer: Self-pay | Admitting: Family Medicine

## 2015-06-18 ENCOUNTER — Ambulatory Visit (INDEPENDENT_AMBULATORY_CARE_PROVIDER_SITE_OTHER): Payer: Medicare Other

## 2015-06-18 ENCOUNTER — Ambulatory Visit (INDEPENDENT_AMBULATORY_CARE_PROVIDER_SITE_OTHER): Payer: Medicare Other | Admitting: Family Medicine

## 2015-06-18 ENCOUNTER — Encounter: Payer: Self-pay | Admitting: Family Medicine

## 2015-06-18 VITALS — BP 150/70 | HR 79 | Wt 189.0 lb

## 2015-06-18 DIAGNOSIS — E119 Type 2 diabetes mellitus without complications: Secondary | ICD-10-CM | POA: Diagnosis not present

## 2015-06-18 DIAGNOSIS — I10 Essential (primary) hypertension: Secondary | ICD-10-CM

## 2015-06-18 DIAGNOSIS — M179 Osteoarthritis of knee, unspecified: Secondary | ICD-10-CM | POA: Diagnosis not present

## 2015-06-18 DIAGNOSIS — I639 Cerebral infarction, unspecified: Secondary | ICD-10-CM

## 2015-06-18 DIAGNOSIS — M25561 Pain in right knee: Secondary | ICD-10-CM | POA: Diagnosis not present

## 2015-06-18 LAB — POCT GLYCOSYLATED HEMOGLOBIN (HGB A1C): Hemoglobin A1C: 6.3

## 2015-06-18 MED ORDER — SITAGLIPTIN PHOSPHATE 100 MG PO TABS
100.0000 mg | ORAL_TABLET | Freq: Every day | ORAL | Status: DC
Start: 2015-06-18 — End: 2015-09-22

## 2015-06-18 MED ORDER — GLIMEPIRIDE 4 MG PO TABS: ORAL_TABLET | ORAL | Status: DC

## 2015-06-18 MED ORDER — ATORVASTATIN CALCIUM 40 MG PO TABS: 40.0000 mg | ORAL_TABLET | Freq: Every day | ORAL | Status: DC

## 2015-06-18 NOTE — Progress Notes (Addendum)
   Subjective:    Patient ID: Fred [name], male    DOB: 1935/07/24, 80 y.o.   MRN: [id number]  HPI Diabetes - no hypoglycemic events. No wounds or sores that are not healing well. No increased thirst or urination. Checking glucose at home. Taking medications as prescribed without any side effects.  Hypertension- Pt denies chest pain, SOB, dizziness, or heart palpitations.  Taking meds as directed w/o problems.  Denies medication side effects.  Has lost 3 lbs.    He also complains of right knee pain. He says it's been bothering him for about 5 years ever since he [place]. He says it never really swells. After the initial fall he did not seek any medical care. It was mostly just bruise. He complains that the pain is deep inside the knee and is not more lateral or medial. He says it's just worse by the end of the day after activity. He never really takes medication for pain relief.  Review of Systems     Objective:   Physical Exam  Constitutional: He is oriented to person, place, and time. He appears well-developed and well-nourished.  HENT:  Head: Normocephalic and atraumatic.  Cardiovascular: Normal rate, regular rhythm and normal heart sounds.   Pulmonary/Chest: Effort normal and breath sounds normal.  Neurological: He is alert and oriented to person, place, and time.  Skin: Skin is warm and dry.  Psychiatric: He has a normal mood and affect. His behavior is normal.   Right knee with normal flexion extension. No increased laxity with anterior posterior drawer. Strength at the hip knee and ankles 5 out of 5 bilaterally. No pain with valgus or varus stress. Negative McMurray's.     Assessment & Plan:  DM-   Eye exam is UTD. Will call for report.  Well controlled on current regimen. Dec lantus to 5 units. F/U in 3 m.  Lab Results  Component Value Date   HGBA1C 6.3 06/18/2015    HTN - BP still high today, though usually well controlled. F/U in 2 weeks for  nurse BP  Gout- recheck uric acid at next OV.   Right knee pain-suspect osteo-arthritis-we'll move forward with x-rays today. Will call with results once available. He has an essentially normal knee exam today.

## 2015-06-18 NOTE — Addendum Note (Signed)
Addended by: Beatrice Lecher D on: 06/18/2015 01:44 PM   Modules accepted: Level of Service

## 2015-06-18 NOTE — Addendum Note (Signed)
Addended by: Narda Rutherford on: 06/18/2015 01:49 PM   Modules accepted: Orders

## 2015-06-18 NOTE — Patient Instructions (Addendum)
Decrease Lantus to 5 units at bedtime.  Keep up to the goodwork.

## 2015-06-25 ENCOUNTER — Encounter: Payer: Self-pay | Admitting: Family Medicine

## 2015-07-02 ENCOUNTER — Ambulatory Visit (INDEPENDENT_AMBULATORY_CARE_PROVIDER_SITE_OTHER): Payer: Medicare Other | Admitting: Family Medicine

## 2015-07-02 VITALS — BP 133/59 | HR 79

## 2015-07-02 DIAGNOSIS — I10 Essential (primary) hypertension: Secondary | ICD-10-CM

## 2015-07-02 DIAGNOSIS — I639 Cerebral infarction, unspecified: Secondary | ICD-10-CM | POA: Diagnosis not present

## 2015-07-02 NOTE — Progress Notes (Signed)
   Subjective:    Patient ID: Fred [name], male    DOB: [birth year], 80 y.o.   MRN: [id number] Pt in today for a bp check.  133/59 today.  Beatris Ship, CMA HPI    Review of Systems     Objective:   Physical Exam        Assessment & Plan:

## 2015-07-02 NOTE — Progress Notes (Signed)
Patient ID: Fred [name], male   DOB: 02/03/1936, 80 y.o.   MRN: [id number]  His blood pressure looks great still just continue to monitor. We originally had him come back because his pressure was high about 2 weeks ago for his appointment.  Beatrice Lecher, MD

## 2015-07-08 ENCOUNTER — Other Ambulatory Visit: Payer: Self-pay | Admitting: Family Medicine

## 2015-07-17 ENCOUNTER — Other Ambulatory Visit: Payer: Self-pay | Admitting: Osteopathic Medicine

## 2015-08-23 ENCOUNTER — Other Ambulatory Visit: Payer: Self-pay | Admitting: Osteopathic Medicine

## 2015-09-21 ENCOUNTER — Ambulatory Visit (INDEPENDENT_AMBULATORY_CARE_PROVIDER_SITE_OTHER): Payer: Medicare Other | Admitting: Family Medicine

## 2015-09-21 ENCOUNTER — Encounter: Payer: Self-pay | Admitting: Family Medicine

## 2015-09-21 VITALS — BP 124/63 | HR 79 | Wt 188.0 lb

## 2015-09-21 DIAGNOSIS — E119 Type 2 diabetes mellitus without complications: Secondary | ICD-10-CM

## 2015-09-21 DIAGNOSIS — I639 Cerebral infarction, unspecified: Secondary | ICD-10-CM | POA: Diagnosis not present

## 2015-09-21 DIAGNOSIS — I4891 Unspecified atrial fibrillation: Secondary | ICD-10-CM

## 2015-09-21 DIAGNOSIS — I5032 Chronic diastolic (congestive) heart failure: Secondary | ICD-10-CM

## 2015-09-21 DIAGNOSIS — R252 Cramp and spasm: Secondary | ICD-10-CM | POA: Diagnosis not present

## 2015-09-21 DIAGNOSIS — I10 Essential (primary) hypertension: Secondary | ICD-10-CM

## 2015-09-21 DIAGNOSIS — Z125 Encounter for screening for malignant neoplasm of prostate: Secondary | ICD-10-CM | POA: Diagnosis not present

## 2015-09-21 LAB — POCT GLYCOSYLATED HEMOGLOBIN (HGB A1C): Hemoglobin A1C: 6.4

## 2015-09-21 NOTE — Progress Notes (Signed)
Subjective:    CC: DM, HTN  HPI: Diabetes - no hypoglycemic events. No wounds or sores that are not healing well. No increased thirst or urination. Checking glucose at home. Taking medications as prescribed without any side effects.  Cramping around his calves. Occuring mostly at night.  Usually occurs in the middle of the nigh and can occur when he is stretching. Stared about 2 months ago.   Hypertension- Pt denies chest pain, SOB, dizziness, or heart palpitations.  Taking meds as directed w/o problems.  Denies medication side effects.    Congestive heart failure-he denies any shortness of breath, palpitations or increased swelling or fluid.  Past medical history, Surgical history, Family history not pertinant except as noted below, Social history, Allergies, and medications have been entered into the medical record, reviewed, and corrections made.   Review of Systems: No fevers, chills, night sweats, weight loss, chest pain, or shortness of breath.       General: Well Developed, well nourished, and in no acute distress.  Neuro: Alert and oriented x3, extra-ocular muscles intact, sensation grossly intact.  HEENT: Normocephalic, atraumatic  Skin: Warm and dry, no rashes. Cardiac: Regular rate and rhythm, no murmurs rubs or gallops, no lower extremity edema.  Respiratory: Clear to auscultation bilaterally. Not using accessory muscles, speaking in full sentences.   Impression and Recommendations:   DM- 13 A1c is 6.4 today. Looks fantastic. Continue current regimen and follow-up in 3-4 months. On ASA, ACEi,  and statin.  I exam has been scheduled.  HTN - Well controlled. Continue current regimen. Follow up in 3-4 months.   Afib - stable on regimen.    CHF, chronic diastolic- On ACE and Lasix.    Leg cramps - will check potassium and thyroid.

## 2015-09-21 NOTE — Patient Instructions (Signed)
Leg Cramps Leg cramps occur when a muscle or muscles tighten and you have no control over this tightening (involuntary muscle contraction). Muscle cramps can develop in any muscle, but the most common place is in the calf muscles of the leg. Those cramps can occur during exercise or when you are at rest. Leg cramps are painful, and they may last for a few seconds to a few minutes. Cramps may return several times before they finally stop. Usually, leg cramps are not caused by a serious medical problem. In many cases, the cause is not known. Some common causes include:  Overexertion.  Overuse from repetitive motions, or doing the same thing over and over.  Remaining in a certain position for a long period of time.  Improper preparation, form, or technique while performing a sport or an activity.  Dehydration.  Injury.  Side effects of some medicines.  Abnormally low levels of the salts and ions in your blood (electrolytes), especially potassium and calcium. These levels could be low if you are taking water pills (diuretics) or if you are pregnant. HOME CARE INSTRUCTIONS Watch your condition for any changes. Taking the following actions may help to lessen any discomfort that you are feeling:  Stay well-hydrated. Drink enough fluid to keep your urine clear or pale yellow.  Try massaging, stretching, and relaxing the affected muscle. Do this for several minutes at a time.  For tight or tense muscles, use a warm towel, heating pad, or hot shower water directed to the affected area.  If you are sore or have pain after a cramp, applying ice to the affected area may relieve discomfort.  Put ice in a plastic bag.  Place a towel between your skin and the bag.  Leave the ice on for 20 minutes, 2-3 times per day.  Avoid strenuous exercise for several days if you have been having frequent leg cramps.  Make sure that your diet includes the essential minerals for your muscles to work  normally.  Take medicines only as directed by your health care provider. SEEK MEDICAL CARE IF:  Your leg cramps get more severe or more frequent, or they do not improve over time.  Your foot becomes cold, numb, or blue.   This information is not intended to replace advice given to you by your health care provider. Make sure you discuss any questions you have with your health care provider.   Document Released: 04/07/2004 Document Revised: 07/15/2014 Document Reviewed: 02/05/2014 Elsevier Interactive Patient Education 2016 Elsevier Inc.   

## 2015-09-22 DIAGNOSIS — E119 Type 2 diabetes mellitus without complications: Secondary | ICD-10-CM | POA: Diagnosis not present

## 2015-09-22 DIAGNOSIS — I10 Essential (primary) hypertension: Secondary | ICD-10-CM | POA: Diagnosis not present

## 2015-09-22 LAB — COMPLETE METABOLIC PANEL WITH GFR
ALBUMIN: 4.3 g/dL (ref 3.6–5.1)
ALK PHOS: 63 U/L (ref 40–115)
ALT: 19 U/L (ref 9–46)
AST: 18 U/L (ref 10–35)
BILIRUBIN TOTAL: 0.7 mg/dL (ref 0.2–1.2)
BUN: 22 mg/dL (ref 7–25)
CO2: 21 mmol/L (ref 20–31)
CREATININE: 1.07 mg/dL (ref 0.70–1.18)
Calcium: 9.6 mg/dL (ref 8.6–10.3)
Chloride: 106 mmol/L (ref 98–110)
GFR, Est African American: 76 mL/min (ref >=60)
GFR, Est Non African American: 66 mL/min (ref >=60)
GLUCOSE: 121 mg/dL — AB (ref 65–99)
Potassium: 4.6 mmol/L (ref 3.5–5.3)
SODIUM: 139 mmol/L (ref 135–146)
TOTAL PROTEIN: 7.1 g/dL (ref 6.1–8.1)

## 2015-09-22 LAB — CBC
HCT: 43.7 % (ref 38.5–50.0)
Hemoglobin: 14.7 g/dL (ref 13.2–17.1)
MCH: 31.1 pg (ref 27.0–33.0)
MCHC: 33.6 g/dL (ref 32.0–36.0)
MCV: 92.4 fL (ref 80.0–100.0)
MPV: 11 fL (ref 7.5–12.5)
PLATELETS: 276 10*3/uL (ref 140–400)
RBC: 4.73 MIL/uL (ref 4.20–5.80)
RDW: 13.5 % (ref 11.0–15.0)
WBC: 7.7 10*3/uL (ref 3.8–10.8)

## 2015-09-22 LAB — LIPID PANEL
Cholesterol: 117 mg/dL — ABNORMAL LOW (ref 125–200)
HDL: 40 mg/dL (ref >=40)
LDL CALC: 58 mg/dL (ref <130)
Total CHOL/HDL Ratio: 2.9 Ratio (ref <=5.0)
Triglycerides: 97 mg/dL (ref <150)
VLDL: 19 mg/dL (ref <30)

## 2015-09-22 LAB — CK: CK TOTAL: 70 U/L (ref 7–232)

## 2015-09-22 LAB — TSH: TSH: 1.29 m[IU]/L (ref 0.40–4.50)

## 2015-09-22 MED ORDER — EZETIMIBE 10 MG PO TABS: 10.0000 mg | ORAL_TABLET | Freq: Every day | ORAL | Status: DC

## 2015-09-22 MED ORDER — SITAGLIPTIN PHOSPHATE 100 MG PO TABS: 100.0000 mg | ORAL_TABLET | Freq: Every day | ORAL | Status: DC

## 2015-09-22 MED ORDER — ALLOPURINOL 300 MG PO TABS: 300.0000 mg | ORAL_TABLET | Freq: Every day | ORAL | Status: DC

## 2015-09-22 NOTE — Addendum Note (Signed)
Addended by: Teddy Spike on: 09/22/2015 11:13 AM   Modules accepted: Orders, SmartSet

## 2015-09-23 ENCOUNTER — Ambulatory Visit: Payer: Medicare Other | Admitting: Neurology

## 2015-09-23 NOTE — Progress Notes (Signed)
Quick Note:  All labs are normal. ______ 

## 2015-09-30 DIAGNOSIS — H11001 Unspecified pterygium of right eye: Secondary | ICD-10-CM | POA: Diagnosis not present

## 2015-09-30 DIAGNOSIS — H527 Unspecified disorder of refraction: Secondary | ICD-10-CM | POA: Diagnosis not present

## 2015-09-30 DIAGNOSIS — H26492 Other secondary cataract, left eye: Secondary | ICD-10-CM | POA: Diagnosis not present

## 2015-09-30 DIAGNOSIS — H35372 Puckering of macula, left eye: Secondary | ICD-10-CM | POA: Diagnosis not present

## 2015-09-30 DIAGNOSIS — E113293 Type 2 diabetes mellitus with mild nonproliferative diabetic retinopathy without macular edema, bilateral: Secondary | ICD-10-CM | POA: Diagnosis not present

## 2015-09-30 LAB — HM DIABETES EYE EXAM

## 2015-10-03 ENCOUNTER — Other Ambulatory Visit: Payer: Self-pay | Admitting: Family Medicine

## 2015-10-03 ENCOUNTER — Other Ambulatory Visit: Payer: Self-pay | Admitting: Osteopathic Medicine

## 2015-10-15 ENCOUNTER — Other Ambulatory Visit: Payer: Self-pay | Admitting: Family Medicine

## 2015-11-04 ENCOUNTER — Other Ambulatory Visit: Payer: Self-pay | Admitting: Family Medicine

## 2015-12-22 ENCOUNTER — Encounter: Payer: Self-pay | Admitting: Family Medicine

## 2015-12-22 ENCOUNTER — Ambulatory Visit (INDEPENDENT_AMBULATORY_CARE_PROVIDER_SITE_OTHER): Payer: Medicare Other | Admitting: Family Medicine

## 2015-12-22 VITALS — BP 136/73 | HR 91 | Wt 191.0 lb

## 2015-12-22 DIAGNOSIS — Z794 Long term (current) use of insulin: Secondary | ICD-10-CM

## 2015-12-22 DIAGNOSIS — Z23 Encounter for immunization: Secondary | ICD-10-CM

## 2015-12-22 DIAGNOSIS — E118 Type 2 diabetes mellitus with unspecified complications: Secondary | ICD-10-CM | POA: Diagnosis not present

## 2015-12-22 DIAGNOSIS — I2510 Atherosclerotic heart disease of native coronary artery without angina pectoris: Secondary | ICD-10-CM | POA: Diagnosis not present

## 2015-12-22 DIAGNOSIS — I639 Cerebral infarction, unspecified: Secondary | ICD-10-CM

## 2015-12-22 DIAGNOSIS — I10 Essential (primary) hypertension: Secondary | ICD-10-CM

## 2015-12-22 LAB — POCT UA - MICROALBUMIN
Creatinine, POC: 50 mg/dL
MICROALBUMIN (UR) POC: 80 mg/L

## 2015-12-22 LAB — POCT GLYCOSYLATED HEMOGLOBIN (HGB A1C): HEMOGLOBIN A1C: 6.1

## 2015-12-22 NOTE — Progress Notes (Signed)
Subjective:    CC: DM  HPI: Diabetes - 2 hypoglycemic events in the last month from not eating a meal. No wounds or sores that are not healing well. No increased thirst or urination. Checking glucose at home. Taking medications as prescribed without any side effects.  Hypertension- Pt denies chest pain, SOB, dizziness, or heart palpitations.  Taking meds as directed w/o problems.  Denies medication side effects.    CAD- no recent CP or SOB.    Past medical history, Surgical history, Family history not pertinant except as noted below, Social history, Allergies, and medications have been entered into the medical record, reviewed, and corrections made.   Review of Systems: No fevers, chills, night sweats, weight loss, chest pain, or shortness of breath.   Objective:    General: Well Developed, well nourished, and in no acute distress.  Neuro: Alert and oriented x3, extra-ocular muscles intact, sensation grossly intact.  HEENT: Normocephalic, atraumatic  Skin: Warm and dry, no rashes. Cardiac: Regular rate and rhythm, no murmurs rubs or gallops, no lower extremity edema.  Respiratory: Clear to auscultation bilaterally. Not using accessory muscles, speaking in full sentences.   Impression and Recommendations:    DM- Well controlled. A1C of 6.1 today looks fantastic.  Urine macro performed today. Okay to stop Lantus since he has had a couple of hypoglycemic events in the last month. Follow-up in 3-4 months. Lab Results  Component Value Date   HGBA1C 6.1 12/22/2015     HTN - Well controlled. Continue current regimen. Follow up in  3-4 months.   CAD - Stable. No recent symptoms. On ASA and statin.

## 2015-12-22 NOTE — Addendum Note (Signed)
Addended by: Teddy Spike on: 12/22/2015 03:28 PM   Modules accepted: Orders

## 2015-12-22 NOTE — Patient Instructions (Signed)
Stop your Lantus.

## 2016-02-24 ENCOUNTER — Other Ambulatory Visit: Payer: Self-pay | Admitting: *Deleted

## 2016-02-24 DIAGNOSIS — I2510 Atherosclerotic heart disease of native coronary artery without angina pectoris: Secondary | ICD-10-CM

## 2016-02-24 DIAGNOSIS — E119 Type 2 diabetes mellitus without complications: Secondary | ICD-10-CM

## 2016-02-24 DIAGNOSIS — I10 Essential (primary) hypertension: Secondary | ICD-10-CM

## 2016-02-24 MED ORDER — ATORVASTATIN CALCIUM 40 MG PO TABS
40.0000 mg | ORAL_TABLET | Freq: Every day | ORAL | 1 refills | Status: DC
Start: 2016-02-24 — End: 2016-08-02

## 2016-02-24 MED ORDER — NIACIN ER (ANTIHYPERLIPIDEMIC) 500 MG PO TBCR
500.0000 mg | EXTENDED_RELEASE_TABLET | Freq: Every day | ORAL | 3 refills | Status: DC
Start: 2016-02-24 — End: 2016-08-02

## 2016-02-24 MED ORDER — SITAGLIPTIN PHOSPHATE 100 MG PO TABS: 100.0000 mg | ORAL_TABLET | Freq: Every day | ORAL | 3 refills | Status: DC

## 2016-02-24 MED ORDER — DIVALPROEX SODIUM ER 500 MG PO TB24: 500.0000 mg | ORAL_TABLET | Freq: Every day | ORAL | 6 refills | Status: DC

## 2016-02-24 MED ORDER — EZETIMIBE 10 MG PO TABS
10.0000 mg | ORAL_TABLET | Freq: Every day | ORAL | 1 refills | Status: DC
Start: 2016-02-24 — End: 2016-08-02

## 2016-02-24 MED ORDER — METFORMIN HCL 1000 MG PO TABS: 1000.0000 mg | ORAL_TABLET | Freq: Two times a day (BID) | ORAL | 3 refills | Status: DC

## 2016-02-24 MED ORDER — GLIMEPIRIDE 4 MG PO TABS: ORAL_TABLET | ORAL | 1 refills | Status: DC

## 2016-02-24 MED ORDER — ALLOPURINOL 300 MG PO TABS: 300.0000 mg | ORAL_TABLET | Freq: Every day | ORAL | 4 refills | Status: DC

## 2016-02-24 MED ORDER — IPRATROPIUM BROMIDE 0.03 % NA SOLN: NASAL | 3 refills | Status: DC

## 2016-03-23 ENCOUNTER — Ambulatory Visit: Payer: Medicare Other | Admitting: Family Medicine

## 2016-05-05 ENCOUNTER — Ambulatory Visit (INDEPENDENT_AMBULATORY_CARE_PROVIDER_SITE_OTHER): Payer: Medicare Other | Admitting: Family Medicine

## 2016-05-05 ENCOUNTER — Encounter: Payer: Self-pay | Admitting: Family Medicine

## 2016-05-05 VITALS — BP 117/81 | HR 89 | Ht 63.0 in | Wt 189.0 lb

## 2016-05-05 DIAGNOSIS — I2510 Atherosclerotic heart disease of native coronary artery without angina pectoris: Secondary | ICD-10-CM | POA: Diagnosis not present

## 2016-05-05 DIAGNOSIS — Z8673 Personal history of transient ischemic attack (TIA), and cerebral infarction without residual deficits: Secondary | ICD-10-CM

## 2016-05-05 DIAGNOSIS — I10 Essential (primary) hypertension: Secondary | ICD-10-CM

## 2016-05-05 DIAGNOSIS — E118 Type 2 diabetes mellitus with unspecified complications: Secondary | ICD-10-CM

## 2016-05-05 DIAGNOSIS — Z794 Long term (current) use of insulin: Secondary | ICD-10-CM | POA: Diagnosis not present

## 2016-05-05 LAB — POCT GLYCOSYLATED HEMOGLOBIN (HGB A1C): Hemoglobin A1C: 7.4

## 2016-05-05 NOTE — Progress Notes (Addendum)
Subjective:    CC: DM, HTN  HPI: Diabetes - no hypoglycemic events. No wounds or sores that are not healing well. No increased thirst or urination. Checking glucose at home. Taking medications as prescribed without any side effects.  Hypertension- Pt denies chest pain, SOB, dizziness, or heart palpitations.  Taking meds as directed w/o problems.  Denies medication side effects.    CAD/Hx of stroke - No recent chest pain episodes. He is taking his beta blocker, ACE inhibitor, and statin and aspirin daily.  Past medical history, Surgical history, Family history not pertinant except as noted below, Social history, Allergies, and medications have been entered into the medical record, reviewed, and corrections made.   Review of Systems: No fevers, chills, night sweats, weight loss, chest pain, or shortness of breath.   Objective:    General: Well Developed, well nourished, and in no acute distress.  Neuro: Alert and oriented x3, extra-ocular muscles intact, sensation grossly intact.  HEENT: Normocephalic, atraumatic  Skin: Warm and dry, no rashes. Cardiac: Regular rate and rhythm, no murmurs rubs or gallops, no lower extremity edema.  Respiratory: Clear to auscultation bilaterally. Not using accessory muscles, speaking in full sentences.   Impression and Recommendations:    DM- Uncontrolled . His A1c is up to 7.4 today. It was at 6.1 when I last saw him. He admits that he's been going through a lot of birthday parties and eating some things that he should not. Encouraged him to get back on track because he was an excellent level III months ago. Follow-up in 3 months and at that point his A1c is not at goal then we will need to make some adjustments today.  HTN - Well controlled. Continue current regimen. Follow up in  3-4 months.  I do need him to confirm whether or not he is actually taking the lisinopril. We have on his medication list I have not written for and is actually on his  intolerance list. He will check at home and let me know.  CAD/Hx of stroke - asymptomatic. Continue with statin and aspirin and beta blocker.

## 2016-05-06 ENCOUNTER — Other Ambulatory Visit: Payer: Self-pay | Admitting: Family Medicine

## 2016-05-06 LAB — BASIC METABOLIC PANEL WITH GFR
BUN: 14 mg/dL (ref 7–25)
CALCIUM: 9.6 mg/dL (ref 8.6–10.3)
CO2: 25 mmol/L (ref 20–31)
Chloride: 101 mmol/L (ref 98–110)
Creat: 1.01 mg/dL (ref 0.70–1.18)
GFR, EST AFRICAN AMERICAN: 81 mL/min (ref >=60)
GFR, EST NON AFRICAN AMERICAN: 70 mL/min (ref >=60)
Glucose, Bld: 131 mg/dL — ABNORMAL HIGH (ref 65–99)
Potassium: 4.6 mmol/L (ref 3.5–5.3)
Sodium: 139 mmol/L (ref 135–146)

## 2016-05-06 NOTE — Progress Notes (Signed)
All labs are normal. 

## 2016-05-08 ENCOUNTER — Telehealth: Payer: Self-pay

## 2016-05-08 MED ORDER — LOSARTAN POTASSIUM 25 MG PO TABS: 25.0000 mg | ORAL_TABLET | Freq: Every day | ORAL | 1 refills | Status: DC

## 2016-05-08 NOTE — Telephone Encounter (Signed)
Ok. Will send in losartan low dose tab to help protect his kidneys with his diabetes.    Beatrice Lecher, MD

## 2016-05-08 NOTE — Telephone Encounter (Signed)
Fred [name] states he has not been taking the lisinopril.

## 2016-05-09 NOTE — Telephone Encounter (Signed)
Notified pt. 

## 2016-05-11 ENCOUNTER — Ambulatory Visit (INDEPENDENT_AMBULATORY_CARE_PROVIDER_SITE_OTHER): Payer: Medicare Other | Admitting: Family Medicine

## 2016-05-11 ENCOUNTER — Telehealth: Payer: Self-pay

## 2016-05-11 ENCOUNTER — Ambulatory Visit (INDEPENDENT_AMBULATORY_CARE_PROVIDER_SITE_OTHER): Payer: Medicare Other

## 2016-05-11 ENCOUNTER — Encounter: Payer: Self-pay | Admitting: Family Medicine

## 2016-05-11 VITALS — BP 127/67 | HR 116 | Temp 98.7°F | Ht 63.0 in | Wt 186.0 lb

## 2016-05-11 DIAGNOSIS — R05 Cough: Secondary | ICD-10-CM

## 2016-05-11 DIAGNOSIS — R062 Wheezing: Secondary | ICD-10-CM

## 2016-05-11 DIAGNOSIS — J209 Acute bronchitis, unspecified: Secondary | ICD-10-CM

## 2016-05-11 DIAGNOSIS — J9811 Atelectasis: Secondary | ICD-10-CM | POA: Diagnosis not present

## 2016-05-11 DIAGNOSIS — R059 Cough, unspecified: Secondary | ICD-10-CM

## 2016-05-11 DIAGNOSIS — I2510 Atherosclerotic heart disease of native coronary artery without angina pectoris: Secondary | ICD-10-CM | POA: Diagnosis not present

## 2016-05-11 DIAGNOSIS — I700 Atherosclerosis of aorta: Secondary | ICD-10-CM | POA: Diagnosis not present

## 2016-05-11 DIAGNOSIS — R0602 Shortness of breath: Secondary | ICD-10-CM | POA: Diagnosis not present

## 2016-05-11 MED ORDER — PREDNISONE 20 MG PO TABS: 40.0000 mg | ORAL_TABLET | Freq: Every day | ORAL | 0 refills | Status: DC

## 2016-05-11 MED ORDER — DOXYCYCLINE HYCLATE 100 MG PO TABS: 100.0000 mg | ORAL_TABLET | Freq: Two times a day (BID) | ORAL | 0 refills | Status: DC

## 2016-05-11 MED ORDER — BENZONATATE 200 MG PO CAPS: 200.0000 mg | ORAL_CAPSULE | Freq: Two times a day (BID) | ORAL | 0 refills | Status: DC | PRN

## 2016-05-11 MED ORDER — ALBUTEROL SULFATE HFA 108 (90 BASE) MCG/ACT IN AERS: 2.0000 | INHALATION_SPRAY | Freq: Four times a day (QID) | RESPIRATORY_TRACT | 0 refills | Status: DC | PRN

## 2016-05-11 MED ORDER — ALBUTEROL SULFATE (2.5 MG/3ML) 0.083% IN NEBU: 2.5000 mg | INHALATION_SOLUTION | Freq: Four times a day (QID) | RESPIRATORY_TRACT | 0 refills | Status: DC | PRN

## 2016-05-11 MED ORDER — ALBUTEROL SULFATE (2.5 MG/3ML) 0.083% IN NEBU
2.5000 mg | INHALATION_SOLUTION | Freq: Once | RESPIRATORY_TRACT | Status: AC
  Administered 2016-05-11: 2.5 mg via RESPIRATORY_TRACT

## 2016-05-11 NOTE — Telephone Encounter (Signed)
Fred [name] granddaughter, called and states he is wanting a cough medication. Please advise.

## 2016-05-11 NOTE — Telephone Encounter (Signed)
Prescription sent for Tessalon Perles

## 2016-05-11 NOTE — Addendum Note (Signed)
Addended by: Beatrice Lecher D on: 05/11/2016 01:02 PM   Modules accepted: Orders

## 2016-05-11 NOTE — Progress Notes (Signed)
Subjective:    Patient ID: Fred [name], male    DOB: 1935/11/03, 81 y.o.   MRN: [id number]  HPI 81 year old male with diabetes and hypertension and heart disease comes in today complaining of 5 days of cough and body aches and sore throat. His wife felt like he was having a low-grade temperature because he felt sweaty and clammy intermittently. He did get a flu shot this past fall. No nausea or vomiting but he has had some diarrhea. A little bit of mild nasal congestion and sneezing but not severe. And he has been wheezing. The wheezing got worse last night so that's what they call to make an appointment today.   Review of Systems   BP 127/67   Pulse (!) 116   Temp 98.7 F (37.1 C)   Ht 5\' 3"  (1.6 m)   Wt 186 lb (84.4 kg)   SpO2 96%   PF 120 L/min Comment: red zone  BMI 32.95 kg/m     Allergies  Allergen Reactions  . Lisinopril Cough    Past Medical History:  Diagnosis Date  . Atrial fibrillation ([place])    Post operative  . CAD (coronary artery disease)    s/p cabg  . CHF (congestive heart failure) ([place])   . Diverticulosis   . DJD (degenerative joint disease)   . DM (diabetes mellitus) ([place])   . History of anemia   . History of stroke   . Hyperlipidemia   . Hypertension   . 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 Peripheral vascular disease ([place])   . Polyneuropathy, diabetic ([place])   . Seizure disorder, complex partial ([place])   . Syncope and collapse     Past Surgical History:  Procedure Laterality Date  . CATARACT EXTRACTION W/ INTRAOCULAR LENS  IMPLANT, BILATERAL  06/2013   Dr. [name]  . CORONARY ARTERY BYPASS GRAFT  02/13/2004   4 vesel  . removal of sebaceous cyst from neck  08/2007   Dr. [name]  . TONSILLECTOMY      Social History   Social History  . Marital status: Married    Spouse name: Kaio Pirolli  . Number of children: 3  . Years of education: N/A   Occupational History  . retired      retired from [employer]  and from Research [employer], trade union and [employer]   Social History Main Topics  . Smoking status: Former Smoker    Packs/day: 1.00    Years: 22.00    Types: Cigarettes    Quit date: 03/14/1977  . Smokeless tobacco: Never Used  . Alcohol use No  . Drug use: No  . Sexual activity: No   Other Topics Concern  . Not on file   Social History Narrative   3 caffeinated drinks per day. Mostly coffee. He walks 15 minutes 3 days per week. Quit smoking in 1978.    Family History  Problem Relation Age of Onset  . Lung cancer    . Epilepsy Father   . Stroke Mother     Outpatient Encounter Prescriptions as of 05/11/2016  Medication Sig  . allopurinol (ZYLOPRIM) 300 MG tablet Take 1 tablet (300 mg total) by mouth daily.  Marland Kitchen aspirin 325 MG tablet Take 325 mg by mouth daily.  Marland Kitchen atorvastatin (LIPITOR) 40 MG tablet Take 1 tablet (40 mg total) by mouth daily.  . BD PEN NEEDLE NANO U/F 32G X 4 MM MISC USE AS DIRECTED  . Coenzyme Q10 (COQ10) 30 MG  CAPS Take 1 tablet by mouth daily.    . divalproex (DEPAKOTE ER) 500 MG 24 hr tablet Take 1 tablet (500 mg total) by mouth daily.  Marland Kitchen ezetimibe (ZETIA) 10 MG tablet Take 1 tablet (10 mg total) by mouth daily.  . furosemide (LASIX) 40 MG tablet TAKE 1 TABLET BY MOUTH EVERY DAY AS NEEDED FOR FLUID RETENTION  . glimepiride (AMARYL) 4 MG tablet TAKE 1 TABLET BY MOUTH EVERY DAY BEFORE BREAKFAST  . ipratropium (ATROVENT) 0.03 % nasal spray USE 2 SPRAYS IN EACH NOSTRIL EVERY 12 HOURS  . losartan (COZAAR) 25 MG tablet Take 1 tablet (25 mg total) by mouth daily.  . metFORMIN (GLUCOPHAGE) 1000 MG tablet Take 1 tablet (1,000 mg total) by mouth 2 (two) times daily with a meal.  . metoprolol succinate (TOPROL-XL) 50 MG 24 hr tablet TAKE 1 TABLET BY MOUTH EVERY DAY  . niacin (NIASPAN) 500 MG CR tablet Take 1 tablet (500 mg total) by mouth at bedtime.  . sitaGLIPtin (JANUVIA) 100 MG tablet Take 1 tablet (100 mg total) by mouth daily.  Marland Kitchen albuterol (PROVENTIL) (2.5 MG/3ML) 0.083% nebulizer  solution Take 3 mLs (2.5 mg total) by nebulization every 6 (six) hours as needed for wheezing or shortness of breath.  . predniSONE (DELTASONE) 20 MG tablet Take 2 tablets (40 mg total) by mouth daily.  . [DISCONTINUED] albuterol (PROVENTIL HFA;VENTOLIN HFA) 108 (90 Base) MCG/ACT inhaler Inhale 2 puffs into the lungs every 6 (six) hours as needed for wheezing or shortness of breath.  . [EXPIRED] albuterol (PROVENTIL) (2.5 MG/3ML) 0.083% nebulizer solution 2.5 mg    No facility-administered encounter medications on file as of 05/11/2016.          Objective:   Physical Exam  Constitutional: He is oriented to person, place, and time. He appears well-developed and well-nourished.  HENT:  Head: Normocephalic and atraumatic.  Right Ear: External ear normal.  Left Ear: External ear normal.  Nose: Nose normal.  Mouth/Throat: Oropharynx is clear and moist.  TMs and canals are clear.   Eyes: Conjunctivae and EOM are normal. Pupils are equal, round, and reactive to light.  Neck: Neck supple. No thyromegaly present.  Cardiovascular: Normal rate and normal heart sounds.   Pulmonary/Chest: Effort normal and breath sounds normal.  Lymphadenopathy:    He has no cervical adenopathy.  Neurological: He is alert and oriented to person, place, and time.  Skin: Skin is warm and dry.  Psychiatric: He has a normal mood and affect.       Assessment & Plan:  Acute bronchitis-because he only has wheezing on the right side of the go ahead and get a chest x-ray today just because of the asymmetry though I do not hear any crackles. Peak flow is quite low. We did give him an albuterol treatment here in the office and he says he does feel a little bit better so we'll get him some albuterol for home. We'll also put him on 5 days of prednisone and will determine antibiotic choice based on chest x-ray results.

## 2016-05-12 NOTE — Telephone Encounter (Signed)
Patient's granddaughter advised.

## 2016-05-16 ENCOUNTER — Other Ambulatory Visit: Payer: Self-pay

## 2016-05-26 ENCOUNTER — Ambulatory Visit (INDEPENDENT_AMBULATORY_CARE_PROVIDER_SITE_OTHER): Payer: Medicare Other | Admitting: Family Medicine

## 2016-05-26 ENCOUNTER — Encounter: Payer: Self-pay | Admitting: Family Medicine

## 2016-05-26 VITALS — BP 127/58 | HR 88 | Ht 63.0 in | Wt 187.0 lb

## 2016-05-26 DIAGNOSIS — R059 Cough, unspecified: Secondary | ICD-10-CM

## 2016-05-26 DIAGNOSIS — R05 Cough: Secondary | ICD-10-CM | POA: Diagnosis not present

## 2016-05-26 DIAGNOSIS — I2510 Atherosclerotic heart disease of native coronary artery without angina pectoris: Secondary | ICD-10-CM

## 2016-05-26 DIAGNOSIS — J9811 Atelectasis: Secondary | ICD-10-CM | POA: Diagnosis not present

## 2016-05-26 NOTE — Progress Notes (Signed)
   Subjective:    Patient ID: Fred [name], male    DOB: March 04, 1936, 81 y.o.   MRN: [id number]  HPI   2 week f/u for acute bronchitis here for recheck lungs on his lungs. He had abnormal lung sounds when I saw him sedated decided to get a chest x-ray. Fortunately it was negative except for some mild bibasilar atelectasis. Her symptoms do some deep breathing exercises throughout the day and to avoid sitting for long periods of time. I did go ahead and put him on prednisone and doxycycline. He was no sign of acute heart failure.    Review of Systems     Objective:   Physical Exam  Constitutional: He is oriented to person, place, and time. He appears well-developed and well-nourished.  HENT:  Head: Normocephalic and atraumatic.  Cardiovascular: Normal rate, regular rhythm and normal heart sounds.   Pulmonary/Chest: Effort normal and breath sounds normal.  Neurological: He is alert and oriented to person, place, and time.  Skin: Skin is warm and dry.  Psychiatric: He has a normal mood and affect. His behavior is normal.       Assessment & Plan:  Bronchitis-he is completely better. He feels like everything is back to normal and his cough is completely resolved. He said really within 2 days of the prednisone antibiotics he felt much much better. I would actually like for him to schedule spirometry in the next couple of months if possible. He Artie has appointment with me in May so he'll try to schedule it then. He did smoke for about 20 years I do think he is high risk for possible COPD and I like to get him better evaluated. We discussed staying active and making sure that he is taking some deep breaths from the day since he did have some significant atelectasis bilaterally on his recent chest x-ray.  Atelectasis of both lung-see note above.

## 2016-05-26 NOTE — Patient Instructions (Signed)
Please schedule spirometry before you next appt with the nurse.

## 2016-06-02 ENCOUNTER — Encounter: Payer: Self-pay | Admitting: Family Medicine

## 2016-08-02 ENCOUNTER — Ambulatory Visit: Payer: Medicare Other | Admitting: Family Medicine

## 2016-08-02 ENCOUNTER — Ambulatory Visit (INDEPENDENT_AMBULATORY_CARE_PROVIDER_SITE_OTHER): Payer: Medicare Other | Admitting: Family Medicine

## 2016-08-02 VITALS — BP 134/70 | HR 76 | Ht 63.0 in | Wt 185.0 lb

## 2016-08-02 DIAGNOSIS — R05 Cough: Secondary | ICD-10-CM

## 2016-08-02 DIAGNOSIS — G40209 Localization-related (focal) (partial) symptomatic epilepsy and epileptic syndromes with complex partial seizures, not intractable, without status epilepticus: Secondary | ICD-10-CM

## 2016-08-02 DIAGNOSIS — I10 Essential (primary) hypertension: Secondary | ICD-10-CM

## 2016-08-02 DIAGNOSIS — Z87891 Personal history of nicotine dependence: Secondary | ICD-10-CM

## 2016-08-02 DIAGNOSIS — E119 Type 2 diabetes mellitus without complications: Secondary | ICD-10-CM

## 2016-08-02 DIAGNOSIS — I2510 Atherosclerotic heart disease of native coronary artery without angina pectoris: Secondary | ICD-10-CM

## 2016-08-02 DIAGNOSIS — R059 Cough, unspecified: Secondary | ICD-10-CM

## 2016-08-02 LAB — POCT UA - MICROALBUMIN
Creatinine, POC: 300 mg/dL
Microalbumin Ur, POC: 150 mg/L

## 2016-08-02 LAB — PULMONARY FUNCTION TEST

## 2016-08-02 LAB — POCT GLYCOSYLATED HEMOGLOBIN (HGB A1C): Hemoglobin A1C: 8

## 2016-08-02 MED ORDER — LOSARTAN POTASSIUM 25 MG PO TABS: 25.0000 mg | ORAL_TABLET | Freq: Every day | ORAL | 1 refills | Status: DC

## 2016-08-02 MED ORDER — METFORMIN HCL 1000 MG PO TABS: 1000.0000 mg | ORAL_TABLET | Freq: Two times a day (BID) | ORAL | 3 refills | Status: DC

## 2016-08-02 MED ORDER — ALBUTEROL SULFATE (2.5 MG/3ML) 0.083% IN NEBU
2.5000 mg | INHALATION_SOLUTION | Freq: Once | RESPIRATORY_TRACT | Status: AC
  Administered 2016-08-02: 2.5 mg via RESPIRATORY_TRACT

## 2016-08-02 MED ORDER — METOPROLOL SUCCINATE ER 50 MG PO TB24: 50.0000 mg | ORAL_TABLET | Freq: Every day | ORAL | 3 refills | Status: DC

## 2016-08-02 MED ORDER — DIVALPROEX SODIUM ER 500 MG PO TB24: 500.0000 mg | ORAL_TABLET | Freq: Every day | ORAL | 1 refills | Status: DC

## 2016-08-02 MED ORDER — GLIMEPIRIDE 4 MG PO TABS: ORAL_TABLET | ORAL | 1 refills | Status: DC

## 2016-08-02 MED ORDER — ALLOPURINOL 300 MG PO TABS: 300.0000 mg | ORAL_TABLET | Freq: Every day | ORAL | 4 refills | Status: DC

## 2016-08-02 MED ORDER — SITAGLIPTIN PHOSPHATE 100 MG PO TABS: 100.0000 mg | ORAL_TABLET | Freq: Every day | ORAL | 3 refills | Status: DC

## 2016-08-02 MED ORDER — ATORVASTATIN CALCIUM 40 MG PO TABS: 40.0000 mg | ORAL_TABLET | Freq: Every day | ORAL | 1 refills | Status: DC

## 2016-08-02 MED ORDER — INSULIN GLARGINE 100 UNIT/ML SOLOSTAR PEN: 10.0000 [IU] | PEN_INJECTOR | Freq: Every day | SUBCUTANEOUS | 11 refills | Status: DC

## 2016-08-02 MED ORDER — EZETIMIBE 10 MG PO TABS: 10.0000 mg | ORAL_TABLET | Freq: Every day | ORAL | 1 refills | Status: DC

## 2016-08-02 MED ORDER — NIACIN ER (ANTIHYPERLIPIDEMIC) 500 MG PO TBCR: 500.0000 mg | EXTENDED_RELEASE_TABLET | Freq: Every day | ORAL | 3 refills | Status: DC

## 2016-08-02 NOTE — Progress Notes (Signed)
Subjective:    CC: Here for spirometry and diabetes follow-up.  HPI:  81 year old male with a history of coronary artery disease comes in today for spirometry. He recently had a diagnosis of bronchitis. On chest x-ray it showed some mild basilar atelectasis. We discussed some deep breathing exercises. Has a persistent cough and shortness of breath we decided to schedule from a tree as well. He did smoke for about 20 years and so is at risk for COPD. He is doing much better after his bronchitis episode.  Diabetes - no hypoglycemic events. No wounds or sores that are not healing well. No increased thirst or urination. Checking glucose at home. Taking medications as prescribed without any side effects.  Hypertension- Pt denies chest pain, SOB, dizziness, or heart palpitations.  Taking meds as directed w/o problems.  Denies medication side effects.     Past medical history, Surgical history, Family history not pertinant except as noted below, Social history, Allergies, and medications have been entered into the medical record, reviewed, and corrections made.   Review of Systems: No fevers, chills, night sweats, weight loss, chest pain, or shortness of breath.   Objective:    General: Well Developed, well nourished, and in no acute distress.  Neuro: Alert and oriented x3, extra-ocular muscles intact, sensation grossly intact.  HEENT: Normocephalic, atraumatic  Skin: Warm and dry, no rashes. Cardiac: Regular rate and rhythm, no murmurs rubs or gallops, no lower extremity edema.  Respiratory: Clear to auscultation bilaterally. Not using accessory muscles, speaking in full sentences.   Impression and Recommendations:    DM- Uncontrolled.  We will start Lantus 10 units at bedtime. Perception printed. Continue to work on healthy diet and regular exercise and follow-up in 3 months. Lab Results  Component Value Date   HGBA1C 8.0 08/02/2016   HTN- Well controlled. Continue current regimen.  Follow up in  6 months.   Cough-spirometry today shows FVC of 89%, FEV1 of 89% with a ratio 69%. No significant improvement in FEV1 after albuterol. Technically his ratio is 54 which would put him at possible early stage of COPD but overall I think his lungs are functioning okay for 81 years old.  CAD- stable. No recent sxs.    History, partial complex seizures-Dr. Deirdre Evener, his former neurologist is now retired. We will refer him and get him referred to a new neurologist. If he needs a 90 day supply refill on his Depakote I will be happy to do so.

## 2016-08-05 ENCOUNTER — Telehealth: Payer: Self-pay | Admitting: Family Medicine

## 2016-08-05 ENCOUNTER — Other Ambulatory Visit: Payer: Self-pay

## 2016-08-05 DIAGNOSIS — Z794 Long term (current) use of insulin: Principal | ICD-10-CM

## 2016-08-05 DIAGNOSIS — E118 Type 2 diabetes mellitus with unspecified complications: Secondary | ICD-10-CM

## 2016-08-05 MED ORDER — PEN NEEDLES 32G X 6 MM MISC: 1.0000 | Freq: Every day | 1 refills | Status: DC

## 2016-08-05 NOTE — Telephone Encounter (Signed)
Rx sent 

## 2016-08-05 NOTE — Telephone Encounter (Signed)
Patient picked up prescription for Glargine Insulin (Lantus) but stated that there were no needles included. Could we help him with this?   Also, anything we send in for him today needs to be sent to the [facility] on [address] in [place]. Please advise. Thanks!

## 2016-10-04 ENCOUNTER — Ambulatory Visit (INDEPENDENT_AMBULATORY_CARE_PROVIDER_SITE_OTHER): Payer: Medicare Other | Admitting: Neurology

## 2016-10-04 ENCOUNTER — Encounter: Payer: Self-pay | Admitting: Neurology

## 2016-10-04 VITALS — BP 144/78 | HR 63 | Wt 188.2 lb

## 2016-10-04 DIAGNOSIS — I10 Essential (primary) hypertension: Secondary | ICD-10-CM

## 2016-10-04 DIAGNOSIS — I2510 Atherosclerotic heart disease of native coronary artery without angina pectoris: Secondary | ICD-10-CM

## 2016-10-04 DIAGNOSIS — Z951 Presence of aortocoronary bypass graft: Secondary | ICD-10-CM | POA: Diagnosis not present

## 2016-10-04 DIAGNOSIS — R410 Disorientation, unspecified: Secondary | ICD-10-CM | POA: Diagnosis not present

## 2016-10-04 DIAGNOSIS — Z794 Long term (current) use of insulin: Secondary | ICD-10-CM | POA: Diagnosis not present

## 2016-10-04 DIAGNOSIS — E118 Type 2 diabetes mellitus with unspecified complications: Secondary | ICD-10-CM

## 2016-10-04 DIAGNOSIS — Z8673 Personal history of transient ischemic attack (TIA), and cerebral infarction without residual deficits: Secondary | ICD-10-CM | POA: Diagnosis not present

## 2016-10-04 NOTE — Patient Instructions (Signed)
-   continue ASA and lipitor for stroke prevention - Follow up with your primary care physician for stroke risk factor modification. Recommend maintain blood pressure goal <130/80, diabetes with hemoglobin A1c goal below 6.5% and lipids with LDL cholesterol goal below 70 mg/dL.  - check BP and glucose at home.  - seizure precautions. Continue depakote for seizure prevention - healthy diet and regular exercise but no exertion - follow up as needed.

## 2016-10-04 NOTE — Progress Notes (Signed)
STROKE NEUROLOGY FOLLOW UP NOTE  NAME: Fred [name] DOB: Apr 26, 1935  REASON FOR VISIT: stroke follow up HISTORY FROM: daughter on the phone and chart  Today we had the pleasure of seeing Fred [name] in follow-up at our [facility]rology [facility]. Pt was accompanied by wife with daughter on the phone.   History Summary Mr. THESEUS [name] is a 81 y.o. male with history ofHTN, DM, CAD s/p CABG with post operative atrial fibrillation, CHF, PVD, previous stroke, complex partial seizure disorder presenting with left facial droop, mild left hemiparesis, slurred speech, staring off, mumbling and confusion on 09/03/2014 . He did not receive IV t-PA due to rapidly resolving deficits. MRI showed acute small infarct and right posterior frontal lobe and remote small thalamic infarcts. Stroke workup unremarkable including CUS, and LDL, but A1c 7.3 and EF 45-50%. He was continued on aspirin, Lipitor, Zetia and Lantus. Due to questionable seizure, he was put on Depakote ER 500 mg daily. He was discharged in good condition.  As per daughter, patient had CABG done in December 2015, had A. fib after procedure, short duration, no treatment. Patient denies any palpitation. However, at that time due to episode of confusion, concerning for complex partial seizure, EEG was done, not sure about report. MRI done also showed remote strokes.  11/04/14 follow up - the patient has been doing well. No residual deficit, no recurrent stroke symptoms. Blood pressure 135/59. Patient questioning whether he should continue Depakote for seizure treatment. He checked glucose at home, stable. However, he was not taking blood pressure regularly at home.   03/27/15 follow up - pt has been doing well. No seizures. And he has been off depakote for 5 months. Actually he has been back to drive as his wife not able to drive anymore due to neuropathy. No car accident or any issues. 30 day cardiac event monitoring did not show afib. Recent A1C  was below 6 as per pt. BP today 119/66.   Interval History During the interval time, pt has been doing well. No stroke or seizure like activities. Due to his family history of seizure (father), he was put back on Depakote 500 mg daily for seizure prevention. He had 2 episode of confusion, slurred speech after CABG surgery in December 2005, seizure was in the pain at a time but no seizure medication was given. In June 2016, he also had one episode of confusion during admission for stroke, EEG negative, he was put on Depakote 500 mg daily. He was taken off Depakote in August 2016, but recently put back on by his PCP for seizure prevention. His recent A1c 8.0, BP today 144/78. He stated that his BP and glucose controlled well at home.  REVIEW OF SYSTEMS: Full 14 system review of systems performed and notable only for those listed below and in HPI above, all others are negative:  Constitutional:   Cardiovascular:  Ear/Nose/Throat:   Skin: Itching, moles Eyes:   Respiratory:   Gastroitestinal:   Genitourinary:  Hematology/Lymphatic:   Endocrine:  Musculoskeletal:  Joint pain, cramps Allergy/Immunology:   Neurological:   Psychiatric: Decreased energy, change in appetite Sleep: Snoring  The following represents the patient's updated allergies and side effects list: Allergies  Allergen Reactions  . Lisinopril Cough    The neurologically relevant items on the patient's problem list were reviewed on today's visit.  Neurologic Examination  A problem focused neurological exam (12 or more points of the single system neurologic examination, vital signs counts as 1  point, cranial nerves count for 8 points) was performed.  Blood pressure (!) 144/78, pulse 63, weight 188 lb 3.2 oz (85.4 kg).  General - Well nourished, well developed, in no apparent distress.  Ophthalmologic - Sharp disc margins OU.   Cardiovascular - Regular rate and rhythm with no murmur.  Mental Status -  Level of arousal  and orientation to time, place, and person were intact. Language including expression, naming, repetition, comprehension was assessed and found intact. Fund of Knowledge was assessed and was intact.  Cranial Nerves II - XII - II - Visual field intact OU. III, IV, VI - Extraocular movements intact. V - Facial sensation intact bilaterally. VII - Facial movement intact bilaterally. VIII - Hearing & vestibular intact bilaterally. X - Palate elevates symmetrically. XI - Chin turning & shoulder shrug intact bilaterally. XII - Tongue protrusion intact.  Motor Strength - The patient's strength was normal in all extremities and pronator drift was absent.  Bulk was normal and fasciculations were absent.   Motor Tone - Muscle tone was assessed at the neck and appendages and was normal.  Reflexes - The patient's reflexes were 1+ in all extremities and he had no pathological reflexes.  Sensory - Light touch, temperature/pinprick were assessed and were normal.    Coordination - The patient had normal movements in the hands and feet with no ataxia or dysmetria.  Tremor was absent.  Gait and Station - broad based gait.  Data reviewed: I personally reviewed the images and agree with the radiology interpretations.  EEG - Normal electroencephalogram, awake, drpwsy and with activation procedures. There are no focal lateralizing or epileptiform features.  Ct Head Wo Contrast 09/03/2014  Chronic small vessel ischemic changes. No acute intracranial process.   MRI Brain 09/04/2014 Acute small nonhemorrhagic infarct right posterior frontal lobe (motor strip). Remote small thalamic infarcts. Significant small vessel disease type changes. Global atrophy. Slight progression of ventricular prominence may reflect result of central atrophy rather than hydrocephalus.  MRA Head 09/04/2014 Anterior circulation without medium or large size vessel significant stenosis or occlusion. Mild narrowing distal  left vertebral artery. Nonvisualized left posterior inferior cerebellar artery and right anterior inferior cerebellar artery. Moderate to marked focal stenosis P1 segment right posteriorcerebral artery. Mild to moderate narrowing right superior cerebellar artery.  CUS - Bilateral: mild to moderate calcific plaque origin ICA. 1-39% ICA stenosis. Vertebral artery flow is antegrade.  2D echo - - Left ventricle: The cavity size was normal. Wall thickness was increased in a pattern of mild LVH. Systolic function was mildly reduced. The estimated ejection fraction was in the range of 45% to 50%. Wall motion was normal; there were no regional wall motion abnormalities. Doppler parameters are consistent with abnormal left ventricular relaxation (grade 1 diastolic dysfunction). - Mitral valve: Calcified annulus. - Right ventricle: Systolic function was mildly reduced.  30 day cardiac monitoring - no afib  TCD MES - negative  Component     Latest Ref Rng 09/04/2014  Cholesterol     0 - 200 mg/dL 97  Triglycerides     <150 mg/dL 98  HDL Cholesterol     >40 mg/dL 27 (L)  Total CHOL/HDL Ratio      3.6  VLDL     0 - 40 mg/dL 20  LDL (calc)     0 - 99 mg/dL 50  Hemoglobin A1C     4.8 - 5.6 % 7.3 (H)  Mean Plasma Glucose      163  Assessment: As you may recall, he is a 81 y.o. Caucasian male with PMH of HTN, DM, CAD s/p CABG with post operative atrial fibrillation, CHF, PVD, previous stroke, complex partial seizure disorder was admitted on 09/03/2014 for acute small infarct at right posterior frontal lobe and remote small thalamic infarcts. Stroke location suspicious for embolic pattern, especially in the setting of previously post operative A. Fib.  Other stroke workup unremarkable including CUS, and LDL, but A1c 7.3 and EF 45-50%. He was put on Depakote ER 500 mg daily during admission, however, EEG negative and the episode is really questionable for seizure presentation.  Depakote was discontinued and no more seizure during interval time. However, due to concerning for seizure episode in 2005 and 2016 and strong family history of seizure (father), his Depakote was resumed by PCP for seizure prevention. TCD emboli detection negative and 30 day cardiac event monitoring showed no afib. Currently, patient is doing well without neurological deficit. BP and DM controlled well as per pt.  Plan:  - continue ASA and lipitor for stroke prevention - Follow up with your primary care physician for stroke risk factor modification. Recommend maintain blood pressure goal <130/80, diabetes with hemoglobin A1c goal below 6.5% and lipids with LDL cholesterol goal below 70 mg/dL.  - check BP and glucose at home.  - seizure precautions. OK to continue depakote for seizure prevention - healthy diet and regular exercise but no exertion - follow up as needed.  No orders of the defined types were placed in this encounter.   No orders of the defined types were placed in this encounter.   Patient Instructions  - continue ASA and lipitor for stroke prevention - Follow up with your primary care physician for stroke risk factor modification. Recommend maintain blood pressure goal <130/80, diabetes with hemoglobin A1c goal below 6.5% and lipids with LDL cholesterol goal below 70 mg/dL.  - check BP and glucose at home.  - seizure precautions. Continue depakote for seizure prevention - healthy diet and regular exercise but no exertion - follow up as needed.    Rosalin Hawking, MD PhD [facility] [address] [place], [place] [address] [phone]

## 2016-11-03 ENCOUNTER — Ambulatory Visit (INDEPENDENT_AMBULATORY_CARE_PROVIDER_SITE_OTHER): Payer: Medicare Other | Admitting: Family Medicine

## 2016-11-03 ENCOUNTER — Encounter: Payer: Self-pay | Admitting: Family Medicine

## 2016-11-03 VITALS — BP 124/59 | HR 69 | Temp 97.9°F | Wt 186.0 lb

## 2016-11-03 DIAGNOSIS — I10 Essential (primary) hypertension: Secondary | ICD-10-CM

## 2016-11-03 DIAGNOSIS — I2510 Atherosclerotic heart disease of native coronary artery without angina pectoris: Secondary | ICD-10-CM

## 2016-11-03 DIAGNOSIS — Z8673 Personal history of transient ischemic attack (TIA), and cerebral infarction without residual deficits: Secondary | ICD-10-CM | POA: Diagnosis not present

## 2016-11-03 DIAGNOSIS — Z794 Long term (current) use of insulin: Secondary | ICD-10-CM | POA: Diagnosis not present

## 2016-11-03 DIAGNOSIS — G40209 Localization-related (focal) (partial) symptomatic epilepsy and epileptic syndromes with complex partial seizures, not intractable, without status epilepticus: Secondary | ICD-10-CM

## 2016-11-03 DIAGNOSIS — Z23 Encounter for immunization: Secondary | ICD-10-CM

## 2016-11-03 DIAGNOSIS — E118 Type 2 diabetes mellitus with unspecified complications: Secondary | ICD-10-CM

## 2016-11-03 LAB — POCT GLYCOSYLATED HEMOGLOBIN (HGB A1C): Hemoglobin A1C: 6.4

## 2016-11-03 MED ORDER — AMBULATORY NON FORMULARY MEDICATION: 0 refills | Status: DC

## 2016-11-03 NOTE — Progress Notes (Signed)
Subjective:    CC: DM  HPI:  Diabetes - no hypoglycemic events. No wounds or sores that are not healing well. No increased thirst or urination. Not checking glucose at home. Taking medications as prescribed without any side effects. Needs new rx for strips.   Hypertension- Pt denies chest pain, SOB, dizziness, or heart palpitations.  Taking meds as directed w/o problems.  Denies medication side effects.    He did follow-up with neurology recently for history of stroke and possible seizure disorder. They recommended just maintaining strict goals for blood pressure and hemoglobin A1c and LDL. They did decide to continue his Depakote for seizure prevention. And continue him on aspirin and Lip  Past medical history, Surgical history, Family history not pertinant except as noted below, Social history, Allergies, and medications have been entered into the medical record, reviewed, and corrections made.   Review of Systems: No fevers, chills, night sweats, weight loss, chest pain, or shortness of breath.   Objective:    General: Well Developed, well nourished, and in no acute distress.  Neuro: Alert and oriented x3, extra-ocular muscles intact, sensation grossly intact.  HEENT: Normocephalic, atraumatic  Skin: Warm and dry, no rashes. Cardiac: Regular rate and rhythm, no murmurs rubs or gallops, no lower extremity edema.  Respiratory: Clear to auscultation bilaterally. Not using accessory muscles, speaking in full sentences.   Impression and Recommendations:    DM - Well controlled. A1C of 6.4 today.  Continue current regimen. Follow up in  4 months.   HTN - his dog is missing so he is a little upset today and thinks that is why BP is up.  Repeat BP fantastic!!!!    History of stroke-last LDL was 58 which is at goal. Due to recheck since it has been a year.  Partial epilepsy - on depakote. Followed by Neurology.   Flu given.

## 2016-11-09 ENCOUNTER — Other Ambulatory Visit: Payer: Self-pay | Admitting: *Deleted

## 2016-11-09 DIAGNOSIS — Z794 Long term (current) use of insulin: Secondary | ICD-10-CM | POA: Diagnosis not present

## 2016-11-09 DIAGNOSIS — I10 Essential (primary) hypertension: Secondary | ICD-10-CM | POA: Diagnosis not present

## 2016-11-09 DIAGNOSIS — E118 Type 2 diabetes mellitus with unspecified complications: Secondary | ICD-10-CM | POA: Diagnosis not present

## 2016-11-09 MED ORDER — EZETIMIBE 10 MG PO TABS: 10.0000 mg | ORAL_TABLET | Freq: Every day | ORAL | 3 refills | Status: DC

## 2016-11-10 DIAGNOSIS — H02831 Dermatochalasis of right upper eyelid: Secondary | ICD-10-CM | POA: Diagnosis not present

## 2016-11-10 DIAGNOSIS — H02834 Dermatochalasis of left upper eyelid: Secondary | ICD-10-CM | POA: Diagnosis not present

## 2016-11-10 DIAGNOSIS — E113293 Type 2 diabetes mellitus with mild nonproliferative diabetic retinopathy without macular edema, bilateral: Secondary | ICD-10-CM | POA: Diagnosis not present

## 2016-11-10 DIAGNOSIS — H527 Unspecified disorder of refraction: Secondary | ICD-10-CM | POA: Diagnosis not present

## 2016-11-10 DIAGNOSIS — H26492 Other secondary cataract, left eye: Secondary | ICD-10-CM | POA: Diagnosis not present

## 2016-11-10 LAB — COMPLETE METABOLIC PANEL WITH GFR
ALT: 17 U/L (ref 9–46)
AST: 17 U/L (ref 10–35)
Albumin: 4.2 g/dL (ref 3.6–5.1)
Alkaline Phosphatase: 64 U/L (ref 40–115)
BUN: 16 mg/dL (ref 7–25)
CHLORIDE: 107 mmol/L (ref 98–110)
CO2: 24 mmol/L (ref 20–32)
CREATININE: 1.18 mg/dL (ref 0.70–1.18)
Calcium: 9.1 mg/dL (ref 8.6–10.3)
GFR, Est African American: 67 mL/min (ref >=60)
GFR, Est Non African American: 58 mL/min — ABNORMAL LOW (ref >=60)
Glucose, Bld: 146 mg/dL — ABNORMAL HIGH (ref 65–99)
Potassium: 4.6 mmol/L (ref 3.5–5.3)
SODIUM: 142 mmol/L (ref 135–146)
Total Bilirubin: 0.6 mg/dL (ref 0.2–1.2)
Total Protein: 7 g/dL (ref 6.1–8.1)

## 2016-11-10 LAB — LIPID PANEL W/REFLEX DIRECT LDL
CHOL/HDL RATIO: 3.1 ratio (ref <5.0)
Cholesterol: 139 mg/dL (ref <200)
HDL: 45 mg/dL (ref >40)
LDL-CHOLESTEROL: 72 mg/dL
NON-HDL CHOLESTEROL (CALC): 94 mg/dL (ref <130)
Triglycerides: 141 mg/dL (ref <150)

## 2016-11-10 LAB — HM DIABETES EYE EXAM

## 2016-11-16 ENCOUNTER — Encounter: Payer: Self-pay | Admitting: Family Medicine

## 2017-02-08 ENCOUNTER — Ambulatory Visit (INDEPENDENT_AMBULATORY_CARE_PROVIDER_SITE_OTHER): Payer: Medicare Other | Admitting: Family Medicine

## 2017-02-08 ENCOUNTER — Encounter: Payer: Self-pay | Admitting: Family Medicine

## 2017-02-08 VITALS — BP 157/65 | HR 66 | Ht 63.0 in | Wt 193.0 lb

## 2017-02-08 DIAGNOSIS — S161XXA Strain of muscle, fascia and tendon at neck level, initial encounter: Secondary | ICD-10-CM | POA: Diagnosis not present

## 2017-02-08 DIAGNOSIS — Z794 Long term (current) use of insulin: Secondary | ICD-10-CM

## 2017-02-08 DIAGNOSIS — E118 Type 2 diabetes mellitus with unspecified complications: Secondary | ICD-10-CM

## 2017-02-08 DIAGNOSIS — I10 Essential (primary) hypertension: Secondary | ICD-10-CM | POA: Diagnosis not present

## 2017-02-08 DIAGNOSIS — I2510 Atherosclerotic heart disease of native coronary artery without angina pectoris: Secondary | ICD-10-CM

## 2017-02-08 LAB — POCT GLYCOSYLATED HEMOGLOBIN (HGB A1C): HEMOGLOBIN A1C: 6.9

## 2017-02-08 NOTE — Progress Notes (Signed)
Subjective:    CC: DM, HTN  HPI:  Hypertension- Pt denies chest pain, SOB, dizziness, or heart palpitations.  Taking meds as directed w/o problems.  Denies medication side effects.    Diabetes - no hypoglycemic events. No wounds or sores that are not healing well. No increased thirst or urination. Checking glucose at home. He says his sugars are running low so he actually stopped his Lantus about a week ago. He said about twice he started to feel shaky and thought his sugar was low so he held his insulin this past week. He did not actually check his sugar to see how low it was going.   He also complains of neck pain for about 1/2-2 weeks. He says he thinks it might be from just the way he was lying in bed. No known injury. He denies any radicular symptoms. He says it's a little bit worse on the left compared to the right side. He has not been doing any specific treatment such as heating her stretches or anti-inflammatories.  Past medical history, Surgical history, Family history not pertinant except as noted below, Social history, Allergies, and medications have been entered into the medical record, reviewed, and corrections made.   Review of Systems: No fevers, chills, night sweats, weight loss, chest pain, or shortness of breath.   Objective:    General: Well Developed, well nourished, and in no acute distress.  Neuro: Alert and oriented x3, extra-ocular muscles intact, sensation grossly intact.  HEENT: Normocephalic, atraumatic  Skin: Warm and dry, no rashes. Cardiac: Regular rate and rhythm, no murmurs rubs or gallops, no lower extremity edema.  Respiratory: Clear to auscultation bilaterally. Not using accessory muscles, speaking in full sentences. MSK: Normal cervical flexion. Significantly decreased extension likely secondary to arthritis. Decreased rotation right and left and decreased side bending but fairly symmetric. Nontender over the paraspinous muscles and nontender over the  cervical spine itself. Nontender over the trapezius muscles. Shoulders with normal range of motion.   Impression and Recommendations:   HTN - Well controlled. Continue current regimen. Follow up in  3 months  DM- well controlled but hemoglobin A1c up a little bit. A1c 6.9 today which is up from 6.4. He has been holding his insulin for about a week because of low blood sugars. He was started to feel shaky but did not actually check his sugars are just encouraged him to please do that for me just I can see how low he's actually going. For now will decrease insulin to 5 units at night and then he can take 10-4 days where he no he's he might eat a lot particularly around the holidays. Continue Januvia and Amaryl. On an ARB. Labs are up to date.   Cervical strain-recommend exercises to do on his own at home. Also recommend using a heating pad. Okay to use an anti-inflammatory if needed.

## 2017-02-08 NOTE — Patient Instructions (Signed)
If your next not feeling better in about 2 weeks with doing the exercises and using a heating pad and please let me know and we'll get an x-ray. Okay to decrease her Lantus down to 5 units.

## 2017-02-22 ENCOUNTER — Ambulatory Visit (INDEPENDENT_AMBULATORY_CARE_PROVIDER_SITE_OTHER): Payer: Medicare Other | Admitting: Family Medicine

## 2017-02-22 VITALS — BP 136/62 | HR 65 | Wt 190.0 lb

## 2017-02-22 DIAGNOSIS — I10 Essential (primary) hypertension: Secondary | ICD-10-CM | POA: Diagnosis not present

## 2017-02-22 DIAGNOSIS — I2510 Atherosclerotic heart disease of native coronary artery without angina pectoris: Secondary | ICD-10-CM | POA: Diagnosis not present

## 2017-02-22 NOTE — Progress Notes (Signed)
   Subjective:    Patient ID: Fred [name], male    DOB: 03/17/1935, 81 y.o.   MRN: [id number]  HPI  Fred [name] is here for a blood pressure check. Last visit blood pressure was elevated 157/65  Review of Systems     Objective:   Physical Exam        Assessment & Plan:  Hypertension - Today his blood pressure was within normal limits 136/62. Continue current medications. Follow up with Dr Madilyn Fireman in February for HTN and DM.

## 2017-02-22 NOTE — Patient Instructions (Addendum)
Continue current medications. Follow up with Dr Madilyn Fireman in February for HTN and DM.

## 2017-02-22 NOTE — Progress Notes (Signed)
Agree with documentation as above.   Anam Bobby, MD  

## 2017-03-21 ENCOUNTER — Inpatient Hospital Stay (HOSPITAL_COMMUNITY): Payer: Medicare Other

## 2017-03-21 ENCOUNTER — Encounter (HOSPITAL_COMMUNITY): Payer: Self-pay

## 2017-03-21 ENCOUNTER — Other Ambulatory Visit: Payer: Self-pay

## 2017-03-21 ENCOUNTER — Emergency Department (HOSPITAL_COMMUNITY): Payer: Medicare Other

## 2017-03-21 ENCOUNTER — Inpatient Hospital Stay (HOSPITAL_COMMUNITY)
Admission: EM | Admit: 2017-03-21 | Discharge: 2017-03-23 | DRG: 100 | Disposition: A | Discharge status: Home Health | Payer: Medicare Other | Attending: Neurology | Admitting: Neurology

## 2017-03-21 DIAGNOSIS — Z7982 Long term (current) use of aspirin: Secondary | ICD-10-CM | POA: Diagnosis not present

## 2017-03-21 DIAGNOSIS — I639 Cerebral infarction, unspecified: Secondary | ICD-10-CM

## 2017-03-21 DIAGNOSIS — Z951 Presence of aortocoronary bypass graft: Secondary | ICD-10-CM | POA: Diagnosis not present

## 2017-03-21 DIAGNOSIS — E1165 Type 2 diabetes mellitus with hyperglycemia: Secondary | ICD-10-CM | POA: Diagnosis present

## 2017-03-21 DIAGNOSIS — H53461 Homonymous bilateral field defects, right side: Secondary | ICD-10-CM | POA: Diagnosis present

## 2017-03-21 DIAGNOSIS — I340 Nonrheumatic mitral (valve) insufficiency: Secondary | ICD-10-CM | POA: Diagnosis not present

## 2017-03-21 DIAGNOSIS — G459 Transient cerebral ischemic attack, unspecified: Secondary | ICD-10-CM

## 2017-03-21 DIAGNOSIS — R29818 Other symptoms and signs involving the nervous system: Secondary | ICD-10-CM | POA: Diagnosis not present

## 2017-03-21 DIAGNOSIS — E785 Hyperlipidemia, unspecified: Secondary | ICD-10-CM | POA: Diagnosis present

## 2017-03-21 DIAGNOSIS — R29705 NIHSS score 5: Secondary | ICD-10-CM | POA: Diagnosis present

## 2017-03-21 DIAGNOSIS — E1151 Type 2 diabetes mellitus with diabetic peripheral angiopathy without gangrene: Secondary | ICD-10-CM | POA: Diagnosis present

## 2017-03-21 DIAGNOSIS — E669 Obesity, unspecified: Secondary | ICD-10-CM | POA: Diagnosis present

## 2017-03-21 DIAGNOSIS — I2510 Atherosclerotic heart disease of native coronary artery without angina pectoris: Secondary | ICD-10-CM | POA: Diagnosis present

## 2017-03-21 DIAGNOSIS — Z87891 Personal history of nicotine dependence: Secondary | ICD-10-CM

## 2017-03-21 DIAGNOSIS — Z888 Allergy status to other drugs, medicaments and biological substances status: Secondary | ICD-10-CM

## 2017-03-21 DIAGNOSIS — I509 Heart failure, unspecified: Secondary | ICD-10-CM | POA: Diagnosis present

## 2017-03-21 DIAGNOSIS — R414 Neurologic neglect syndrome: Secondary | ICD-10-CM | POA: Diagnosis present

## 2017-03-21 DIAGNOSIS — G92 Toxic encephalopathy: Secondary | ICD-10-CM | POA: Diagnosis present

## 2017-03-21 DIAGNOSIS — Z794 Long term (current) use of insulin: Secondary | ICD-10-CM

## 2017-03-21 DIAGNOSIS — Z6834 Body mass index (BMI) 34.0-34.9, adult: Secondary | ICD-10-CM | POA: Diagnosis not present

## 2017-03-21 DIAGNOSIS — I130 Hypertensive heart and chronic kidney disease with heart failure and stage 1 through stage 4 chronic kidney disease, or unspecified chronic kidney disease: Secondary | ICD-10-CM | POA: Diagnosis present

## 2017-03-21 DIAGNOSIS — E1142 Type 2 diabetes mellitus with diabetic polyneuropathy: Secondary | ICD-10-CM | POA: Diagnosis present

## 2017-03-21 DIAGNOSIS — Z8673 Personal history of transient ischemic attack (TIA), and cerebral infarction without residual deficits: Secondary | ICD-10-CM

## 2017-03-21 DIAGNOSIS — Z820 Family history of epilepsy and other diseases of the nervous system: Secondary | ICD-10-CM | POA: Diagnosis not present

## 2017-03-21 DIAGNOSIS — M109 Gout, unspecified: Secondary | ICD-10-CM | POA: Diagnosis present

## 2017-03-21 DIAGNOSIS — I6789 Other cerebrovascular disease: Secondary | ICD-10-CM | POA: Diagnosis not present

## 2017-03-21 DIAGNOSIS — R410 Disorientation, unspecified: Secondary | ICD-10-CM | POA: Diagnosis not present

## 2017-03-21 DIAGNOSIS — Z823 Family history of stroke: Secondary | ICD-10-CM

## 2017-03-21 DIAGNOSIS — Z961 Presence of intraocular lens: Secondary | ICD-10-CM | POA: Diagnosis present

## 2017-03-21 DIAGNOSIS — I6300 Cerebral infarction due to thrombosis of unspecified precerebral artery: Secondary | ICD-10-CM | POA: Diagnosis not present

## 2017-03-21 DIAGNOSIS — G40209 Localization-related (focal) (partial) symptomatic epilepsy and epileptic syndromes with complex partial seizures, not intractable, without status epilepticus: Secondary | ICD-10-CM | POA: Diagnosis not present

## 2017-03-21 DIAGNOSIS — E1122 Type 2 diabetes mellitus with diabetic chronic kidney disease: Secondary | ICD-10-CM | POA: Diagnosis present

## 2017-03-21 DIAGNOSIS — N183 Chronic kidney disease, stage 3 (moderate): Secondary | ICD-10-CM | POA: Diagnosis present

## 2017-03-21 DIAGNOSIS — R1312 Dysphagia, oropharyngeal phase: Secondary | ICD-10-CM | POA: Diagnosis present

## 2017-03-21 DIAGNOSIS — Z79899 Other long term (current) drug therapy: Secondary | ICD-10-CM

## 2017-03-21 DIAGNOSIS — I6523 Occlusion and stenosis of bilateral carotid arteries: Secondary | ICD-10-CM | POA: Diagnosis not present

## 2017-03-21 DIAGNOSIS — R471 Dysarthria and anarthria: Secondary | ICD-10-CM | POA: Diagnosis present

## 2017-03-21 DIAGNOSIS — R4701 Aphasia: Secondary | ICD-10-CM | POA: Diagnosis not present

## 2017-03-21 HISTORY — DX: Cerebral infarction, unspecified: I63.9

## 2017-03-21 LAB — RAPID URINE DRUG SCREEN, HOSP PERFORMED
AMPHETAMINES: NOT DETECTED
BENZODIAZEPINES: NOT DETECTED
Barbiturates: NOT DETECTED
COCAINE: NOT DETECTED
Opiates: NOT DETECTED
Tetrahydrocannabinol: NOT DETECTED

## 2017-03-21 LAB — COMPREHENSIVE METABOLIC PANEL
ALT: 22 U/L (ref 17–63)
AST: 24 U/L (ref 15–41)
Albumin: 3.9 g/dL (ref 3.5–5.0)
Alkaline Phosphatase: 60 U/L (ref 38–126)
Anion gap: 8 (ref 5–15)
BUN: 15 mg/dL (ref 6–20)
CHLORIDE: 105 mmol/L (ref 101–111)
CO2: 24 mmol/L (ref 22–32)
Calcium: 9.2 mg/dL (ref 8.9–10.3)
Creatinine, Ser: 1.08 mg/dL (ref 0.61–1.24)
GFR calc non Af Amer: >60 mL/min (ref >60)
Glucose, Bld: 172 mg/dL — ABNORMAL HIGH (ref 65–99)
POTASSIUM: 4.4 mmol/L (ref 3.5–5.1)
SODIUM: 137 mmol/L (ref 135–145)
Total Bilirubin: 0.7 mg/dL (ref 0.3–1.2)
Total Protein: 7 g/dL (ref 6.5–8.1)

## 2017-03-21 LAB — I-STAT CHEM 8, ED
BUN: 17 mg/dL (ref 6–20)
CHLORIDE: 103 mmol/L (ref 101–111)
Calcium, Ion: 1.2 mmol/L (ref 1.15–1.40)
Creatinine, Ser: 1 mg/dL (ref 0.61–1.24)
Glucose, Bld: 166 mg/dL — ABNORMAL HIGH (ref 65–99)
HEMATOCRIT: 44 % (ref 39.0–52.0)
HEMOGLOBIN: 15 g/dL (ref 13.0–17.0)
POTASSIUM: 4.5 mmol/L (ref 3.5–5.1)
SODIUM: 141 mmol/L (ref 135–145)
TCO2: 26 mmol/L (ref 22–32)

## 2017-03-21 LAB — URINALYSIS, ROUTINE W REFLEX MICROSCOPIC
BILIRUBIN URINE: NEGATIVE
GLUCOSE, UA: 50 mg/dL — AB
Hgb urine dipstick: NEGATIVE
Ketones, ur: NEGATIVE mg/dL
Leukocytes, UA: NEGATIVE
Nitrite: NEGATIVE
PH: 5 (ref 5.0–8.0)
Protein, ur: NEGATIVE mg/dL
SPECIFIC GRAVITY, URINE: 1.026 (ref 1.005–1.030)

## 2017-03-21 LAB — PROTIME-INR
INR: 1.09
PROTHROMBIN TIME: 14 s (ref 11.4–15.2)

## 2017-03-21 LAB — ETHANOL: Alcohol, Ethyl (B): <10 mg/dL (ref <10)

## 2017-03-21 LAB — DIFFERENTIAL
BASOS ABS: 0.1 10*3/uL (ref 0.0–0.1)
BASOS PCT: 1 %
EOS ABS: 0.3 10*3/uL (ref 0.0–0.7)
Eosinophils Relative: 3 %
Lymphocytes Relative: 39 %
Lymphs Abs: 3.2 10*3/uL (ref 0.7–4.0)
MONO ABS: 0.7 10*3/uL (ref 0.1–1.0)
Monocytes Relative: 9 %
NEUTROS ABS: 4.1 10*3/uL (ref 1.7–7.7)
Neutrophils Relative %: 48 %

## 2017-03-21 LAB — CBC
HCT: 42.7 % (ref 39.0–52.0)
Hemoglobin: 14.3 g/dL (ref 13.0–17.0)
MCH: 31.7 pg (ref 26.0–34.0)
MCHC: 33.5 g/dL (ref 30.0–36.0)
MCV: 94.7 fL (ref 78.0–100.0)
PLATELETS: 235 10*3/uL (ref 150–400)
RBC: 4.51 MIL/uL (ref 4.22–5.81)
RDW: 12.9 % (ref 11.5–15.5)
WBC: 8.3 10*3/uL (ref 4.0–10.5)

## 2017-03-21 LAB — I-STAT TROPONIN, ED: TROPONIN I, POC: 0 ng/mL (ref 0.00–0.08)

## 2017-03-21 LAB — APTT: APTT: 27 s (ref 24–36)

## 2017-03-21 LAB — VALPROIC ACID LEVEL

## 2017-03-21 MED ORDER — ATORVASTATIN CALCIUM 40 MG PO TABS
40.0000 mg | ORAL_TABLET | Freq: Every day | ORAL | Status: DC
  Administered 2017-03-22 – 2017-03-23 (×2): 40 mg via ORAL
  Filled 2017-03-21 (×2): qty 1

## 2017-03-21 MED ORDER — METOPROLOL SUCCINATE ER 25 MG PO TB24
50.0000 mg | ORAL_TABLET | Freq: Every day | ORAL | Status: DC
  Administered 2017-03-22 – 2017-03-23 (×2): 50 mg via ORAL
  Filled 2017-03-21: qty 2
  Filled 2017-03-21: qty 1

## 2017-03-21 MED ORDER — SODIUM CHLORIDE 0.9 % IV SOLN
INTRAVENOUS | Status: DC
  Administered 2017-03-21: 22:00:00 via INTRAVENOUS

## 2017-03-21 MED ORDER — IPRATROPIUM BROMIDE 0.06 % NA SOLN
1.0000 | Freq: Every morning | NASAL | Status: DC
  Filled 2017-03-21 (×2): qty 15

## 2017-03-21 MED ORDER — STROKE: EARLY STAGES OF RECOVERY BOOK
Freq: Once | Status: AC
  Administered 2017-03-22: 11:00:00
  Filled 2017-03-21 (×2): qty 1

## 2017-03-21 MED ORDER — INSULIN GLARGINE 100 UNIT/ML ~~LOC~~ SOLN
5.0000 [IU] | Freq: Every day | SUBCUTANEOUS | Status: DC
Start: 2017-03-22 — End: 2017-03-23
  Administered 2017-03-22: 5 [IU] via SUBCUTANEOUS
  Filled 2017-03-21 (×2): qty 0.05

## 2017-03-21 MED ORDER — CLEVIDIPINE BUTYRATE 0.5 MG/ML IV EMUL: 0.0000 mg/h | INTRAVENOUS | Status: DC

## 2017-03-21 MED ORDER — ALLOPURINOL 100 MG PO TABS
300.0000 mg | ORAL_TABLET | Freq: Every day | ORAL | Status: DC
  Administered 2017-03-22 – 2017-03-23 (×2): 300 mg via ORAL
  Filled 2017-03-21: qty 3
  Filled 2017-03-21: qty 1

## 2017-03-21 MED ORDER — ACETAMINOPHEN 325 MG PO TABS: 650.0000 mg | ORAL_TABLET | ORAL | Status: DC | PRN

## 2017-03-21 MED ORDER — SENNOSIDES-DOCUSATE SODIUM 8.6-50 MG PO TABS: 1.0000 | ORAL_TABLET | Freq: Every evening | ORAL | Status: DC | PRN

## 2017-03-21 MED ORDER — DIVALPROEX SODIUM ER 500 MG PO TB24
500.0000 mg | ORAL_TABLET | Freq: Every day | ORAL | Status: DC
  Administered 2017-03-22 – 2017-03-23 (×2): 500 mg via ORAL
  Filled 2017-03-21 (×2): qty 1

## 2017-03-21 MED ORDER — ACETAMINOPHEN 160 MG/5ML PO SOLN: 650.0000 mg | ORAL | Status: DC | PRN

## 2017-03-21 MED ORDER — NIACIN ER (ANTIHYPERLIPIDEMIC) 500 MG PO TBCR
500.0000 mg | EXTENDED_RELEASE_TABLET | Freq: Every day | ORAL | Status: DC
  Administered 2017-03-22: 500 mg via ORAL
  Filled 2017-03-21 (×2): qty 1

## 2017-03-21 MED ORDER — ACETAMINOPHEN 650 MG RE SUPP: 650.0000 mg | RECTAL | Status: DC | PRN

## 2017-03-21 MED ORDER — COQ10 30 MG PO CAPS: 1.0000 | ORAL_CAPSULE | Freq: Every day | ORAL | Status: DC

## 2017-03-21 MED ORDER — LABETALOL HCL 5 MG/ML IV SOLN: 20.0000 mg | Freq: Once | INTRAVENOUS | Status: DC

## 2017-03-21 MED ORDER — ALTEPLASE (STROKE) FULL DOSE INFUSION
0.9000 mg/kg | Freq: Once | INTRAVENOUS | Status: AC
  Administered 2017-03-21: 79 mg via INTRAVENOUS
  Filled 2017-03-21: qty 100

## 2017-03-21 MED ORDER — EZETIMIBE 10 MG PO TABS
10.0000 mg | ORAL_TABLET | Freq: Every day | ORAL | Status: DC
  Administered 2017-03-22 – 2017-03-23 (×2): 10 mg via ORAL
  Filled 2017-03-21 (×2): qty 1

## 2017-03-21 MED ORDER — IOPAMIDOL (ISOVUE-370) INJECTION 76%
INTRAVENOUS | Status: AC
  Administered 2017-03-21: 50 mL
  Filled 2017-03-21: qty 50

## 2017-03-21 NOTE — ED Notes (Signed)
Patient transported to MRI 

## 2017-03-21 NOTE — Progress Notes (Signed)
Pharmacist Code Stroke Response  Notified to mix tPA at 1922 by Dr. Rory Percy Delivered tPA to RN at 1925  Issues/delays encountered (if applicable): none  Fred [name], PharmD, BCPS Clinical Pharmacist 03/21/2017 7:36 PM

## 2017-03-21 NOTE — H&P (Signed)
STROKE H&P  CC: garbled speech, confusion  History is obtained from: family and patient.  HPI: Fred [name] is a 82 y.o. male who has a documented history of postoperative atrial fibrillation but is on no anticoagulation, coronary artery disease status post CABG, congestive heart failure, hyperlipidemia, hypertension, seizure in the setting of a previous stroke with no residual deficits, diabetic polyneuropathy, syncope, who presented to the emergency room for evaluation of an episode of garbled speech and confusion that the family noted around 5:40 PM on 03/21/2017.  He was feeling unwell for most part of the day today but at around 5 PM was seen to be has normal self without any confusion or speech problems.  At around 5:40 PM a family member noticed that he was repeatedly asking the same questions and was appearing frustrated.  He had a can of suffering in his hand that he crushed, no seizure activity but seem like his fist was clenched at the time.  His prior stroke was in 2016 with similar presentation and a seizure but further workup revealed that he had a stroke, which was cortical and probably caused seizure as the presenting symptom. His initial history provided by the EMS was not very clear for any focality.  He was taken for a noncontrast CT of the head that showed an aspects of 10.  CT angiogram head and neck, preliminary review did not show any large vessel occlusion.  Repeat examination in the room after the CT scan showed the patient to be a phasic and also with probable right-sided neglect and right homonymous hemianopsia along with some extinction, which interestingly was more on the left side on double simultaneous stimulation. Given that his prior presentation of the stroke was with confusion and seizure, this presentation is also concerning for a stroke.  This was discussed in detail with the family members at bedside and the risks and benefits were using IV TPA were discussed as he was  still within the 4-1/2-hour window from last seen normal.  The family agreed to pursuing treatment with IV TPA. IV TPA was initiated-bolus at 1928 hrs. on 03/21/2017.   LKW: 1700 hrs. on 03/21/2017 tpa given?:  Premorbid modified Rankin scale (mRS): 0  ROS:  Unable to obtain due to altered mental status.   Past Medical History:  Diagnosis Date  . Atrial fibrillation ([place])    Post operative  . CAD (coronary artery disease)    s/p cabg  . CHF (congestive heart failure) ([place])   . Diverticulosis   . DJD (degenerative joint disease)   . DM (diabetes mellitus) ([place])   . History of anemia   . History of stroke   . Hyperlipidemia   . Hypertension   . 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 Peripheral vascular disease ([place])   . Polyneuropathy, diabetic ([place])   . Seizure disorder, complex partial ([place])   . Syncope and collapse     Family History  Problem Relation Age of Onset  . Epilepsy Father   . Stroke Mother   . Lung cancer Unknown     Social History:   reports that he quit smoking about 40 years ago. His smoking use included cigarettes. He has a 22.00 pack-year smoking history. he has never used smokeless tobacco. He reports that he does not drink alcohol or use drugs.  Medications  Current Facility-Administered Medications:  .   stroke: mapping our early stages of recovery book, , Does not apply, Once, Rory Percy,  Niko Penson, MD .  0.9 %  sodium chloride infusion, , Intravenous, Continuous, Amie Portland, MD .  acetaminophen (TYLENOL) tablet 650 mg, 650 mg, Oral, Q4H PRN **OR** acetaminophen (TYLENOL) solution 650 mg, 650 mg, Per Tube, Q4H PRN **OR** acetaminophen (TYLENOL) suppository 650 mg, 650 mg, Rectal, Q4H PRN, Amie Portland, MD .  alteplase (ACTIVASE) 1 mg/mL infusion 79 mg, 0.9 mg/kg, Intravenous, Once, Amie Portland, MD .  labetalol (NORMODYNE,TRANDATE) injection 20 mg, 20 mg, Intravenous, Once **AND** clevidipine (CLEVIPREX) infusion 0.5 mg/mL, 0-21 mg/hr,  Intravenous, Continuous, Amie Portland, MD .  senna-docusate (Senokot-S) tablet 1 tablet, 1 tablet, Oral, QHS PRN, Amie Portland, MD  Current Outpatient Medications:  .  allopurinol (ZYLOPRIM) 300 MG tablet, Take 1 tablet (300 mg total) by mouth daily., Disp: 90 tablet, Rfl: 4 .  AMBULATORY NON FORMULARY MEDICATION, Medication Name: Strips for glucometer to test once a day. He doesn't know name of meter. E11.8, Disp: 1 vial, Rfl: 0 .  aspirin 325 MG tablet, Take 325 mg by mouth daily., Disp: , Rfl:  .  atorvastatin (LIPITOR) 40 MG tablet, Take 1 tablet (40 mg total) by mouth daily., Disp: 90 tablet, Rfl: 1 .  Coenzyme Q10 (COQ10) 30 MG CAPS, Take 1 tablet by mouth daily.  , Disp: , Rfl:  .  divalproex (DEPAKOTE ER) 500 MG 24 hr tablet, Take 1 tablet (500 mg total) by mouth daily., Disp: 90 tablet, Rfl: 1 .  ezetimibe (ZETIA) 10 MG tablet, Take 1 tablet (10 mg total) by mouth daily., Disp: 90 tablet, Rfl: 3 .  furosemide (LASIX) 40 MG tablet, TAKE 1 TABLET BY MOUTH EVERY DAY AS NEEDED FOR FLUID RETENTION, Disp: 90 tablet, Rfl: 1 .  glimepiride (AMARYL) 4 MG tablet, TAKE 1 TABLET BY MOUTH EVERY DAY BEFORE BREAKFAST, Disp: 90 tablet, Rfl: 1 .  Insulin Glargine (LANTUS SOLOSTAR) 100 UNIT/ML Solostar Pen, Inject 10-20 Units into the skin daily at 10 pm., Disp: 15 mL, Rfl: 11 .  Insulin Pen Needle (PEN NEEDLES) 32G X 6 MM MISC, 1 each by Does not apply route daily. Use to inject insulin daily. Dx: E11.8, Disp: 100 each, Rfl: 1 .  ipratropium (ATROVENT) 0.03 % nasal spray, USE 2 SPRAYS IN EACH NOSTRIL EVERY 12 HOURS, Disp: 30 mL, Rfl: 3 .  losartan (COZAAR) 25 MG tablet, Take 1 tablet (25 mg total) by mouth daily., Disp: 90 tablet, Rfl: 1 .  metFORMIN (GLUCOPHAGE) 1000 MG tablet, Take 1 tablet (1,000 mg total) by mouth 2 (two) times daily with a meal., Disp: 180 tablet, Rfl: 3 .  metoprolol succinate (TOPROL-XL) 50 MG 24 hr tablet, TAKE 1 TABLET BY MOUTH EVERY DAY, Disp: 90 tablet, Rfl: 1 .  niacin  (NIASPAN) 500 MG CR tablet, Take 1 tablet (500 mg total) by mouth at bedtime., Disp: 90 tablet, Rfl: 3 .  sitaGLIPtin (JANUVIA) 100 MG tablet, Take 1 tablet (100 mg total) by mouth daily., Disp: 90 tablet, Rfl: 3  Exam: Current vital signs: BP (!) 179/82 (BP Location: Right Arm)   Pulse 77   Temp 98.1 F (36.7 C) (Oral)   Resp 17   Ht 5\' 3"  (1.6 m)   Wt 87.4 kg (192 lb 10.9 oz)   SpO2 99%   BMI 34.13 kg/m  Vital signs in last 24 hours: Temp:  [98.1 F (36.7 C)] 98.1 F (36.7 C) (01/08 1935) Pulse Rate:  [76-77] 77 (01/08 1918) Resp:  [12-17] 17 (01/08 1918) BP: (179)/(82) 179/82 (01/08 1918) SpO2:  [97 %-99 %]  99 % (01/08 1918) Weight:  [87.4 kg (192 lb 10.9 oz)] 87.4 kg (192 lb 10.9 oz) (01/08 1900)  GENERAL: Awake, alert in NAD HEENT: - Normocephalic and atraumatic, dry mm, no LN++, no Thyromegally LUNGS - Clear to auscultation bilaterally with no wheezes CV - S1S2 RRR, no m/r/g, equal pulses bilaterally. ABDOMEN - Soft, nontender, nondistended with normoactive BS. Reducible mass on coughing seen in the midline above the umbilicus Ext: warm, well perfused, intact peripheral pulses, no edema  NEURO:  Mental Status: Alert, awake, oriented to self, place and month.  Repeatedly kept on saying the year is 1939-which is the year of his birth. Language: speech is mildly dysarthric.  Naming is impaired, but repetition, fluency are intact and comprehension intact to simple commands. Cranial Nerves: PERRL. EOMI, visual fields-possible right homonymous hemianopsia/but quadrantanopsia the lower quadrant, no facial asymmetry, facial sensation intact, hearing reduced bilaterally, tongue/uvula/soft palate midline, normal sternocleidomastoid and trapezius muscle strength. No evidence of tongue atrophy or fibrillations Motor: 5/5 all over with no drift Tone: is normal and bulk is normal Sensation-decreased in glove and stocking pattern bilaterally.  There is some extinction to double  simultaneous stimulation on the left but the exam was inconsistent. Coordination: FTN intact bilaterally Gait- deferred  NIHSS 1a Level of Conscious.: 0 1b LOC Questions: 1 1c LOC Commands: 0 2 Best Gaze: 0 3 Visual: 1 4 Facial Palsy: 0 5a Motor Arm - left: 0 5b Motor Arm - Right: 0 6a Motor Leg - Left: 0 6b Motor Leg - Right: 0 7 Limb Ataxia: 0 8 Sensory: 0 9 Best Language: 1 10 Dysarthria: 1 11 Extinct. and Inatten.: 1 TOTAL: 5  Labs I have reviewed labs in epic and the results pertinent to this consultation are:  CBC    Component Value Date/Time   WBC 8.3 03/21/2017 1850   RBC 4.51 03/21/2017 1850   HGB 15.0 03/21/2017 1900   HCT 44.0 03/21/2017 1900   PLT 235 03/21/2017 1850   MCV 94.7 03/21/2017 1850   MCH 31.7 03/21/2017 1850   MCHC 33.5 03/21/2017 1850   RDW 12.9 03/21/2017 1850   LYMPHSABS 3.2 03/21/2017 1850   MONOABS 0.7 03/21/2017 1850   EOSABS 0.3 03/21/2017 1850   BASOSABS 0.1 03/21/2017 1850   CMP     Component Value Date/Time   NA 141 03/21/2017 1900   K 4.5 03/21/2017 1900   CL 103 03/21/2017 1900   CO2 24 11/09/2016 1014   GLUCOSE 166 (H) 03/21/2017 1900   BUN 17 03/21/2017 1900   CREATININE 1.00 03/21/2017 1900   CREATININE 1.18 11/09/2016 1014   CALCIUM 9.1 11/09/2016 1014   PROT 7.0 11/09/2016 1014   ALBUMIN 4.2 11/09/2016 1014   AST 17 11/09/2016 1014   ALT 17 11/09/2016 1014   ALKPHOS 64 11/09/2016 1014   BILITOT 0.6 11/09/2016 1014   GFRNONAA 58 (L) 11/09/2016 1014   GFRAA 67 11/09/2016 1014    Lipid Panel     Component Value Date/Time   CHOL 139 11/09/2016 1014   TRIG 141 11/09/2016 1014   HDL 45 11/09/2016 1014   CHOLHDL 3.1 11/09/2016 1014   VLDL 19 09/21/2015 1045   LDLCALC 58 09/21/2015 1045     Imaging I have reviewed the images obtained: CT-scan of the brain - aspects 10 no bleed CTA head and neck shows no evidence of large vessel occlusion.  Assessment:  82 year old man with documented history of  postoperative atrial fibrillation but on no anticoagulants, coronary artery disease  status post CABG, congestive heart failure, hyperlipidemia, hypertension, seizure in the setting of a previous stroke in the right MCA territory on the motor strip with no residual deficits, diabetic polyneuropathy and syncope presented to the emergency room for evaluation of garbled speech and confusion with last known normal around 5 PM. His initial presentation was unclear according to EMS and had no focality on description or exam.  Repeat examination did reveal evidence of mild expressive aphasia and possible right hemianopsia and left sided neglect.  Although this does not localize to one particular vascular territory, given his prior stroke with similar presentation, this was concerning for an acute ischemic stroke. He was within the 4-1/2 hours window for IV TPA.  Risks and benefits of IV TPA were discussed with the family and he was given IV TPA at 1928 hrs. on 03/21/2017 after discussion with the family who agreed to its use. 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 No emergent large vessel occlusion seen on CT angiogram of the head and neck-hence not a candidate for endovascular treatment.  Impression: Acute ischemic stroke-likely cardioembolic involving both right and the left cerebral hemispheres Evaluate for seizure Toxic metabolic encephalopathy could also be on differentials Possible abdominal epigastic hernia - chronic - no evidence of strnagulation  Plan: Acute Ischemic Stroke Evaluate for seizure Cerebral infarction due to embolism of right middle cerebral artery Cerebral infarction due to embolism of left middle cerebral artery   Acuity: Acute Current Suspected Etiology: cardioembolic Continue Evaluation:  -Admit to: NICU -Hold Aspirin until 24 hour post tPA neuroimaging is stable and without evidence of bleeding -Blood pressure control, goal of  SYS <180 -MRI/ECHO/A1C/Lipid panel. -Hyperglycemia management per SSI to maintain glucose 140-180mg /dL. -PT/OT/ST therapies and recommendations when able  CNS -Close neuro monitoring -c/w Depakote home dose -Check depakote level and LFTs  Dysarthria Dysphagia following cerebral infarction  -NPO until cleared by speech -ST -Advance diet as tolerated -PT/OT  Toxic encephalopathy -Correct metabolic causes -Monitor  RESP No active issues On ipratropium athome. Continue home dose. Monitor clinically  CV Essential (primary) hypertension -Aggressive BP control, goal SBP < 180  Heart failure, unspecified -TTE  Hyperlipidemia, unspecified  - Statin for goal LDL < 70  Evaluate for paroxysmal atrial fibrillation -Maintain on telemetry  HEME -Monitor -transfuse for hgb < 7 -Trend PT/PTT/INR  ENDO Type 2 diabetes mellitus with hyperglycemia  -SSI -goal HgbA1c < 7  GI/GU CKD Stage 3 (GFR 30-59) -Gentle hydration -avoid nephrotoxic agents -Needs OP f/u for abd wall hernia  Fluid/Electrolyte Disorders -Replete -Repeat labs  ID Possible Aspiration PNA -CXR -NPO -Monitor  Nutrition E66.9 Obesity  -diet consult  Prophylaxis DVT:scf   GI: NA Bowel: doc/senna   Diet: NPO until cleared by speech/bedside swallow eval  Code Status: Full Code  THE FOLLOWING WERE PRESENT ON ADMISSION: CNS -  Acute Ischemic Stroke, dysarthria Respiratory -  Probable Aspiration Pneumonia Cardiovascular - unspecified CHF, HTN GI - abd wall hernia Renal -  CKD  Delays in TPA administration- unclear history, unclear last known normal that had to be verified with family members over the phone as the family members at bedside were not very clear on the time, atypical presentation  -- Amie Portland, MD [facility] Pager: [phone] If 7pm to 7am, please call on call as listed on AMION.  CRITICAL CARE ATTESTATION This patient is critically ill and at significant  risk of neurological worsening, death and care requires constant monitoring of vital  signs, hemodynamics,respiratory and cardiac monitoring. I spent 50  minutes of neurocritical care time performing neurological assessment, discussion with family, other specialists and medical decision making of high complexity in the care of  this patient.

## 2017-03-21 NOTE — ED Notes (Signed)
ED Provider at bedside with Neurologist and pharmacist speaking with patient about treatment.

## 2017-03-21 NOTE — ED Notes (Signed)
Pharmacy rec pending medications due

## 2017-03-21 NOTE — ED Triage Notes (Signed)
Pt arrived from home via [facility] after pt experienced an episode of confusion that was witnessed by family. Pt was sitting in chair drinking a coke and started to have disoriented speech and was squeezing his coke and leaking it on his lap. Pt also confused to time.

## 2017-03-22 ENCOUNTER — Encounter (HOSPITAL_COMMUNITY): Payer: Self-pay | Admitting: *Deleted

## 2017-03-22 ENCOUNTER — Inpatient Hospital Stay (HOSPITAL_COMMUNITY): Payer: Medicare Other

## 2017-03-22 DIAGNOSIS — I340 Nonrheumatic mitral (valve) insufficiency: Secondary | ICD-10-CM

## 2017-03-22 DIAGNOSIS — I6300 Cerebral infarction due to thrombosis of unspecified precerebral artery: Secondary | ICD-10-CM

## 2017-03-22 DIAGNOSIS — R410 Disorientation, unspecified: Secondary | ICD-10-CM

## 2017-03-22 LAB — GLUCOSE, CAPILLARY
GLUCOSE-CAPILLARY: 124 mg/dL — AB (ref 65–99)
GLUCOSE-CAPILLARY: 134 mg/dL — AB (ref 65–99)
Glucose-Capillary: 123 mg/dL — ABNORMAL HIGH (ref 65–99)

## 2017-03-22 LAB — ECHOCARDIOGRAM COMPLETE
HEIGHTINCHES: 63 in
WEIGHTICAEL: 3082.91 [oz_av]

## 2017-03-22 LAB — LIPID PANEL
CHOLESTEROL: 143 mg/dL (ref 0–200)
HDL: 36 mg/dL — ABNORMAL LOW (ref >40)
LDL Cholesterol: 79 mg/dL (ref 0–99)
Total CHOL/HDL Ratio: 4 RATIO
Triglycerides: 142 mg/dL (ref <150)
VLDL: 28 mg/dL (ref 0–40)

## 2017-03-22 LAB — HEMOGLOBIN A1C
HEMOGLOBIN A1C: 6.5 % — AB (ref 4.8–5.6)
MEAN PLASMA GLUCOSE: 139.85 mg/dL

## 2017-03-22 LAB — MRSA PCR SCREENING: MRSA BY PCR: NEGATIVE

## 2017-03-22 MED ORDER — ASPIRIN 325 MG PO TABS
325.0000 mg | ORAL_TABLET | Freq: Every day | ORAL | Status: DC
  Administered 2017-03-22 – 2017-03-23 (×2): 325 mg via ORAL
  Filled 2017-03-22 (×2): qty 1

## 2017-03-22 MED ORDER — INSULIN ASPART 100 UNIT/ML ~~LOC~~ SOLN
0.0000 [IU] | Freq: Three times a day (TID) | SUBCUTANEOUS | Status: DC
  Administered 2017-03-22 (×2): 2 [IU] via SUBCUTANEOUS
  Administered 2017-03-23: 3 [IU] via SUBCUTANEOUS

## 2017-03-22 MED ORDER — INSULIN ASPART 100 UNIT/ML ~~LOC~~ SOLN
0.0000 [IU] | Freq: Every day | SUBCUTANEOUS | Status: DC
Start: 2017-03-22 — End: 2017-03-23

## 2017-03-22 NOTE — Progress Notes (Signed)
PT Cancellation Note  Patient Details Name: Fred [name] MRN: [id number] DOB: April 27, 1935   Cancelled Treatment:    Reason Eval/Treat Not Completed: Patient not medically ready, active bedrest orders at this time.   [name] 03/22/2017, 7:42 AM Alben Deeds, PT DPT  Board Certified Neurologic Specialist [phone]

## 2017-03-22 NOTE — Evaluation (Signed)
Speech Language Pathology Evaluation Patient Details Name: Fred [name] MRN: [id number] DOB: [birth year] Today's Date: 03/22/2017 Time: 1275-1700 SLP Time Calculation (min) (ACUTE ONLY): 22 min  Problem List:  Patient Active Problem List   Diagnosis Date Noted  . Stroke (cerebrum) ([place]) 03/21/2017  . Episode of confusion 10/04/2016  . Type 2 diabetes mellitus with complication, with long-term current use of insulin ([place]) 03/27/2015  . Atherosclerosis of native coronary artery of native heart without angina pectoris 03/27/2015  . Palpitations 11/04/2014  . S/P CABG (coronary artery bypass graft) 11/04/2014  . Atrial fibrillation, unspecified   . TIA (transient ischemic attack) 09/04/2014  . History of stroke 09/04/2014  . Obesity (BMI 30-39.9) 11/29/2013  . History of gout 05/16/2013  . Congestive heart failure ([place]) 04/20/2009  . DYSPNEA 02/11/2009  . CARDIOVASCULAR FUNCTION STUDY, ABNORMAL 01/29/2009  . NECK PAIN 08/03/2007  . ANEMIA / OTHER 05/16/2007  . Coronary atherosclerosis 05/16/2007  . PAROXYSMAL ATRIAL FIBRILLATION 05/16/2007  . STROKE 05/16/2007  . Hyperlipidemia 03/30/2007  . DEGENERATIVE JOINT DISEASE 03/30/2007  . PAROTID LESION, UNSPECIFIED 09/29/2006  . Partial epilepsy with impairment of consciousness ([place]) 09/29/2006  . POLYNEUROPATHY-DIABETIC 09/29/2006  . Essential hypertension 09/29/2006  . PERIPHERAL VASCULAR DISEASE 09/29/2006  . DIVERTICULOSIS 09/29/2006   Past Medical History:  Past Medical History:  Diagnosis Date  . Atrial fibrillation ([place])    Post operative  . CAD (coronary artery disease)    s/p cabg  . CHF (congestive heart failure) ([place])   . Diverticulosis   . DJD (degenerative joint disease)   . DM (diabetes mellitus) ([place])   . History of anemia   . History of stroke   . Hyperlipidemia   . Hypertension   . 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 Peripheral vascular disease ([place])   . Polyneuropathy, diabetic ([place])    . Seizure disorder, complex partial ([place])   . Syncope and collapse    Past Surgical History:  Past Surgical History:  Procedure Laterality Date  . CATARACT EXTRACTION W/ INTRAOCULAR LENS  IMPLANT, BILATERAL  06/2013   Dr. [name]  . CORONARY ARTERY BYPASS GRAFT  02/13/2004   4 vesel  . removal of sebaceous cyst from neck  08/2007   Dr. [name]  . TONSILLECTOMY     HPI:  82 y.o.Caucasianmalewith PMH of HTN, DM, CAD s/p CABG with post operative atrial fibrillation, CHF, PVD,previous strokes, complex partial seizure disorder, presents with concern for episode of difficulty speaking and confusion. tPA administered. MRI with no acute abnormality.    Assessment / Plan / Recommendation Clinical Impression  Pt's speech/language is WFL - expression is fluent; no dysarthria.  Comprehension WNL.  Mild short-term memory deficits are consistent with baseline.  No acute needs identified - our services will sign off.  Pt/family agree.     SLP Assessment  SLP Recommendation/Assessment: Patient does not need any further Speech Lanaguage Pathology Services SLP Visit Diagnosis: Dysphagia, oropharyngeal phase (R13.12)    Follow Up Recommendations       Frequency and Duration           SLP Evaluation Cognition  Overall Cognitive Status: Within Functional Limits for tasks assessed Arousal/Alertness: Awake/alert Orientation Level: Oriented X4 Attention: Sustained Sustained Attention: Appears intact Memory: Impaired Memory Impairment: Retrieval deficit Awareness: Appears intact Problem Solving: Impaired Problem Solving Impairment: Verbal complex       Comprehension  Auditory Comprehension Overall Auditory Comprehension: Appears within functional limits for tasks assessed Visual Recognition/Discrimination Discrimination: Within Function  Limits    Expression Expression Primary Mode of Expression: Verbal Verbal Expression Overall Verbal Expression: Appears within functional  limits for tasks assessed Written Expression Dominant Hand: Right   Oral / Motor  Oral Motor/Sensory Function Overall Oral Motor/Sensory Function: Within functional limits Motor Speech Overall Motor Speech: Appears within functional limits for tasks assessed   GO                    [name] 03/22/2017, 1:47 PM

## 2017-03-22 NOTE — Procedures (Signed)
ELECTROENCEPHALOGRAM REPORT  Date of Study: 03/22/2017  Patient's Name: Fred [name] MRN: [id number] Date of Birth: 1935/05/29  Referring Provider: Dr. Antony Contras  Clinical History: This is a 82 year old man with a history of seizures, with an episode of difficulty speaking and confusion.  Medications: Depakote Tylenol Allopurinol Lipitor Zetia Insulin Metoprolol Niacin  Technical Summary: A multichannel digital EEG recording measured by the international 10-20 system with electrodes applied with paste and impedances below 5000 ohms performed as portable with EKG monitoring in an awake and asleep patient.  Hyperventilation and photic stimulation were not performed.  The digital EEG was referentially recorded, reformatted, and digitally filtered in a variety of bipolar and referential montages for optimal display.   Description: The patient is awake and asleep during the recording.  During brief period of maximal wakefulness, there is a symmetric, medium voltage 7 Hz posterior dominant rhythm that attenuates with eye opening. The record is symmetric. During drowsiness and sleep, there is an increase in theta and delta slowing of the background, at times with shifting asymmetry over the bilateral temporal regions, left greater than right.  Vertex waves and symmetric sleep spindles were seen.  Hyperventilation and photic stimulation were not performed.  There were no epileptiform discharges or electrographic seizures seen.    EKG lead was unremarkable.  Impression: This awake and asleep EEG is mildly abnormal due to slowing of the posterior dominant rhythm.   Clinical Correlation of the above findings indicates mild diffuse cerebral dysfunction that is non-specific in etiology and can be seen with hypoxic/ischemic injury, toxic/metabolic encephalopathies, neurodegenerative disorders, or medication effect.  The absence of epileptiform discharges does not rule out a clinical  diagnosis of epilepsy.  Clinical correlation is advised.   Ellouise Newer, M.D.

## 2017-03-22 NOTE — Progress Notes (Signed)
  Echocardiogram 2D Echocardiogram has been performed.  Fred [name] 03/22/2017, 12:11 PM

## 2017-03-22 NOTE — Evaluation (Signed)
Physical Therapy Evaluation Patient Details Name: Fred [name] MRN: [id number] DOB: Mar 08, 1936 Today's Date: 03/22/2017   History of Present Illness  82 y.o. male who has a documented history of postoperative atrial fibrillation but is on no anticoagulation, coronary artery disease status post CABG, congestive heart failure, hyperlipidemia, hypertension, seizure in the setting of a previous stroke with no residual deficits, diabetic polyneuropathy, syncope, who presented to the emergency room for evaluation of an episode of garbled speech and confusion  Clinical Impression  Orders received for PT evaluation. Patient demonstrates deficits in functional mobility as indicated below. Will benefit from continued skilled PT to address deficits and maximize function. Will see as indicated and progress as tolerated.  Patient with modest instability but overall mobilizing fairly well. Recommend initial HHPT and supervision upon acute discharge.    Follow Up Recommendations Home health PT;Supervision/Assistance - 24 hour    Equipment Recommendations  None recommended by PT    Recommendations for Other Services       Precautions / Restrictions Precautions Precautions: Fall Restrictions Weight Bearing Restrictions: No      Mobility  Bed Mobility Overal bed mobility: Needs Assistance Bed Mobility: Supine to Sit;Sit to Supine     Supine to sit: Supervision Sit to supine: Supervision   General bed mobility comments: supervision for safety and line management, no physical assist required  Transfers Overall transfer level: Needs assistance Equipment used: None Transfers: Sit to/from Stand Sit to Stand: Min guard         General transfer comment: min guard for safety, no physical assist required. Performed from bed and toilet  Ambulation/Gait Ambulation/Gait assistance: Min guard;Min assist Ambulation Distance (Feet): 180 Feet Assistive device: 1 person hand held  assist;None Gait Pattern/deviations: Step-through pattern;Decreased stride length;Drifts right/left Gait velocity: decreased Gait velocity interpretation: Below normal speed for age/gender General Gait Details: patient with some modest instability during ambulation, initial HHA for stability, improved to no assist with distance  Stairs            Wheelchair Mobility    Modified Rankin (Stroke Patients Only) Modified Rankin (Stroke Patients Only) Pre-Morbid Rankin Score: Slight disability Modified Rankin: Moderate disability     Balance Overall balance assessment: Needs assistance   Sitting balance-Leahy Scale: Good     Standing balance support: No upper extremity supported;During functional activity Standing balance-Leahy Scale: Fair Standing balance comment: able to stand and perform functional tasks without difficulty             High level balance activites: Direction changes High Level Balance Comments: min guard for safety             Pertinent Vitals/Pain Pain Assessment: No/denies pain    Home Living Family/patient expects to be discharged to:: Private residence Living Arrangements: Spouse/significant other Available Help at Discharge: Family;Available 24 hours/day Type of Home: House Home Access: Stairs to enter   [employer] of Steps: 2 Home Layout: Two level;Able to live on main level with bedroom/bathroom Home Equipment: Cane - quad;Bedside commode;Shower seat;Grab bars - tub/shower;Hand held Adult nurse)      Prior Function Level of Independence: Independent         Comments: ADLs, iADLs, driving     Hand Dominance   Dominant Hand: Right    Extremity/Trunk Assessment   Upper Extremity Assessment Upper Extremity Assessment: Overall WFL for tasks assessed    Lower Extremity Assessment Lower Extremity Assessment: Overall WFL for tasks assessed(+ arthritic changes noted)       Communication  Communication: HOH  Cognition Arousal/Alertness: Awake/alert Behavior During Therapy: WFL for tasks assessed/performed                                          General Comments      Exercises     Assessment/Plan    PT Assessment Patient needs continued PT services  PT Problem List Decreased activity tolerance;Decreased balance;Decreased mobility       PT Treatment Interventions DME instruction;Gait training;Stair training;Functional mobility training;Therapeutic activities;Therapeutic exercise;Balance training;Patient/family education    PT Goals (Current goals can be found in the Care Plan section)  Acute Rehab PT Goals Patient Stated Goal: to go home PT Goal Formulation: With patient/family Time For Goal Achievement: 04/05/17 Potential to Achieve Goals: Good    Frequency Min 3X/week   Barriers to discharge        Co-evaluation PT/OT/SLP Co-Evaluation/Treatment: Yes Reason for Co-Treatment: For patient/therapist safety PT goals addressed during session: Mobility/safety with mobility OT goals addressed during session: ADL's and self-care       AM-PAC PT "6 Clicks" Daily Activity  Outcome Measure Difficulty turning over in bed (including adjusting bedclothes, sheets and blankets)?: A Little Difficulty moving from lying on back to sitting on the side of the bed? : A Little Difficulty sitting down on and standing up from a chair with arms (e.g., wheelchair, bedside commode, etc,.)?: A Little Help needed moving to and from a bed to chair (including a wheelchair)?: A Little Help needed walking in hospital room?: A Little Help needed climbing 3-5 steps with a railing? : A Little 6 Click Score: 18    End of Session Equipment Utilized During Treatment: Gait belt Activity Tolerance: Patient tolerated treatment well Patient left: in bed;with call bell/phone within reach;with family/visitor present Nurse Communication: Mobility status PT Visit Diagnosis:  Unsteadiness on feet (R26.81)    Time: 0938-1000 PT Time Calculation (min) (ACUTE ONLY): 22 min   Charges:   PT Evaluation $PT Eval Moderate Complexity: 1 Mod     PT G Codes:        Alben Deeds, PT DPT  Board Certified Neurologic Specialist [place] 03/22/2017, 10:15 AM

## 2017-03-22 NOTE — Evaluation (Signed)
Occupational Therapy Evaluation Patient Details Name: Fred [name] MRN: [id number] DOB: March 15, 1935 Today's Date: 03/22/2017    History of Present Illness 82 y.o. male who has a documented history of postoperative atrial fibrillation but is on no anticoagulation, coronary artery disease status post CABG, congestive heart failure, hyperlipidemia, hypertension, seizure in the setting of a previous stroke with no residual deficits, diabetic polyneuropathy, syncope, who presented to the emergency room for evaluation of an episode of garbled speech and confusion   Clinical Impression   PTA, pt was living with his wife and was independent. Pt currently performing ADLs at supervision-Min Guard level and functional mobility with single hand held A for safety and initial balance. Pt presenting near baseline for cognition, strength, and functional performance. Educated pt and family for safety at home. Recommend dc home once medically stable per physician. All acute OT needs met and will sign off. Please re-consult OT if functional performance changes. Thank you.     Follow Up Recommendations  No OT follow up;Supervision/Assistance - 24 hour    Equipment Recommendations  None recommended by OT    Recommendations for Other Services       Precautions / Restrictions Precautions Precautions: Fall Restrictions Weight Bearing Restrictions: No      Mobility Bed Mobility Overal bed mobility: Needs Assistance Bed Mobility: Supine to Sit;Sit to Supine     Supine to sit: Supervision Sit to supine: Supervision   General bed mobility comments: supervision for safety and line management, no physical assist required  Transfers Overall transfer level: Needs assistance Equipment used: None Transfers: Sit to/from Stand Sit to Stand: Min guard         General transfer comment: min guard for safety, no physical assist required. Performed from bed and toilet    Balance Overall balance  assessment: Needs assistance   Sitting balance-Leahy Scale: Good     Standing balance support: No upper extremity supported;During functional activity Standing balance-Leahy Scale: Fair Standing balance comment: able to stand and perform functional tasks without difficulty             High level balance activites: Direction changes High Level Balance Comments: min guard for safety           ADL either performed or assessed with clinical judgement   ADL Overall ADL's : Needs assistance/impaired     Grooming: Wash/dry hands;Wash/dry face;Min guard;Standing Grooming Details (indicate cue type and reason): Min Guard for safety. Pt demonstrating safe dynamic balanc otbend forward over isnk and wash his face Upper Body Bathing: Set up;Supervision/ safety;Sitting   Lower Body Bathing: Min guard;Sit to/from stand   Upper Body Dressing : Set up;Supervision/safety;Sitting Upper Body Dressing Details (indicate cue type and reason): Donned/doffed second gown like jacket. Lower Body Dressing: Min guard;Sit to/from stand Lower Body Dressing Details (indicate cue type and reason): Able to bend forward and adjust socks without LOB Toilet Transfer: Min guard;Ambulation;Regular Toilet           Functional mobility during ADLs: Minimal assistance(Single hand held A) General ADL Comments: Pt performing ADLs at [facility] level and funcitonal mobility with Min A for balance and safety. Pt performing near baseline fucntion.      Vision Baseline Vision/History: Wears glasses Wears Glasses: (driving) Patient Visual Report: No change from baseline       Perception     Praxis      Pertinent Vitals/Pain Pain Assessment: No/denies pain     Hand Dominance Right   Extremity/Trunk Assessment Upper Extremity  Assessment Upper Extremity Assessment: Overall WFL for tasks assessed   Lower Extremity Assessment Lower Extremity Assessment: Defer to PT evaluation   Cervical / Trunk  Assessment Cervical / Trunk Assessment: Normal   Communication Communication Communication: HOH   Cognition Arousal/Alertness: Awake/alert Behavior During Therapy: WFL for tasks assessed/performed Overall Cognitive Status: Within Functional Limits for tasks assessed                                 General Comments: WFL for tasks completed.    General Comments  Family present throughout session. SpO2 staying in 90s thorughout session on RA    Exercises     Shoulder Instructions      Home Living Family/patient expects to be discharged to:: Private residence Living Arrangements: Spouse/significant other Available Help at Discharge: Family;Available 24 hours/day Type of Home: House Home Access: Stairs to enter [employer] of Steps: 2   Home Layout: Two level;Able to live on main level with bedroom/bathroom Alternate Level Stairs-Number of Steps: flight   Bathroom Shower/Tub: Tub/shower unit;Curtain   Biochemist, clinical: Standard     Home Equipment: Cane - quad;Bedside commode;Shower seat;Grab bars - tub/shower;Hand held Adult nurse)          Prior Functioning/Environment Level of Independence: Independent        Comments: ADLs, IADLs, driving        OT Problem List: Impaired balance (sitting and/or standing);Decreased knowledge of use of DME or AE;Decreased knowledge of precautions      OT Treatment/Interventions:      OT Goals(Current goals can be found in the care plan section) Acute Rehab OT Goals Patient Stated Goal: to go home OT Goal Formulation: All assessment and education complete, DC therapy  OT Frequency:     Barriers to D/C:            Co-evaluation PT/OT/SLP Co-Evaluation/Treatment: Yes Reason for Co-Treatment: For patient/therapist safety PT goals addressed during session: Mobility/safety with mobility OT goals addressed during session: ADL's and self-care      AM-PAC PT "6 Clicks" Daily Activity      Outcome Measure Help from another person eating meals?: None Help from another person taking care of personal grooming?: A Little Help from another person toileting, which includes using toliet, bedpan, or urinal?: A Little Help from another person bathing (including washing, rinsing, drying)?: A Little Help from another person to put on and taking off regular upper body clothing?: None Help from another person to put on and taking off regular lower body clothing?: A Little 6 Click Score: 20   End of Session Equipment Utilized During Treatment: Gait belt Nurse Communication: Mobility status  Activity Tolerance: Patient tolerated treatment well Patient left: in bed;with call bell/phone within reach;with bed alarm set;with family/visitor present  OT Visit Diagnosis: Unsteadiness on feet (R26.81);Muscle weakness (generalized) (M62.81)                Time: 0938-1000 OT Time Calculation (min): 22 min Charges:  OT General Charges $OT Visit: 1 Visit OT Evaluation $OT Eval Moderate Complexity: 1 Mod G-Codes:     Ellina Sivertsen MSOT, OTR/L Acute Rehab Pager: [phone] Office: [place] 03/22/2017, 10:41 AM

## 2017-03-22 NOTE — Progress Notes (Signed)
EEG completed, results pending. 

## 2017-03-22 NOTE — Progress Notes (Signed)
OT Cancellation    03/22/17 0700  OT Visit Information  Last OT Received On 03/22/17  Reason Eval/Treat Not Completed Medical issues which prohibited therapy. Pt with active bedrest orders. Will await increase in activity orders prior to initiating OT evaluation. Thank you.   [place], OTR/L Acute Rehab Pager: [phone] Office: [phone]

## 2017-03-22 NOTE — Progress Notes (Signed)
NEUROHOSPITALISTS STROKE TEAM - DAILY PROGRESS NOTE   ADMISSION HISTORY: Fred [name] is a 82 y.o. male who has a documented history of postoperative atrial fibrillation but is on no anticoagulation, coronary artery disease status post CABG, congestive heart failure, hyperlipidemia, hypertension, seizure in the setting of a previous stroke with no residual deficits, diabetic polyneuropathy, syncope, who presented to the emergency room for evaluation of an episode of garbled speech and confusion that the family noted around 5:40 PM on 03/21/2017.  He was feeling unwell for most part of the day today but at around 5 PM was seen to be has normal self without any confusion or speech problems.  At around 5:40 PM a family member noticed that he was repeatedly asking the same questions and was appearing frustrated.  He had a can of suffering in his hand that he crushed, no seizure activity but seem like his fist was clenched at the time.  His prior stroke was in 2016 with similar presentation and a seizure but further workup revealed that he had a stroke, which was cortical and probably caused seizure as the presenting symptom.  His initial history provided by the EMS was not very clear for any focality.  He was taken for a noncontrast CT of the head that showed an aspects of 10.  CT angiogram head and neck, preliminary review did not show any large vessel occlusion.  Repeat examination in the room after the CT scan showed the patient to be aphasic and also with probable right-sided neglect and right homonymous hemianopsia along with some extinction, which interestingly was more on the left side on double simultaneous stimulation.  Given that his prior presentation of the stroke was with confusion and seizure, this presentation is also concerning for a stroke.  This was discussed in detail with the family members at bedside and the risks and benefits were using IV  TPA were discussed as he was still within the 4-1/2-hour window from last seen normal.  The family agreed to pursuing treatment with IV TPA.  IV TPA was initiated-bolus at 1928 hrs. on 03/21/2017. Delays in TPA administration- unclear history, unclear last known normal that had to be verified with family members over the phone as the family members at bedside were not very clear on the time, atypical presentation  LKW: 1700 hrs. on 03/21/2017 tpa given?:  Premorbid modified Rankin scale (mRS): 0 NIHSS 1a Level of Conscious.: 0 1b LOC Questions: 1 1c LOC Commands: 0 2 Best Gaze: 0 3 Visual: 1 4 Facial Palsy: 0 5a Motor Arm - left: 0 5b Motor Arm - Right: 0 6a Motor Leg - Left: 0 6b Motor Leg - Right: 0 7 Limb Ataxia: 0 8 Sensory: 0 9 Best Language: 1 10 Dysarthria: 1 11 Extinct. and Inatten.: 1 TOTAL: 5  SUBJECTIVE (INTERVAL HISTORY) Family  is at the bedside. Patient is found laying in bed in NAD. Overall he feels his condition is rapidly improving. Voices no new complaints. No new events reported overnight.   OBJECTIVE Lab Results: CBC:  Recent Labs  Lab 03/21/17 1850 03/21/17 1900  WBC 8.3  --   HGB 14.3 15.0  HCT 42.7 44.0  MCV 94.7  --   PLT 235  --    BMP: Recent Labs  Lab 03/21/17 1850 03/21/17 1900  NA 137 141  K 4.4 4.5  CL 105 103  CO2 24  --   GLUCOSE 172* 166*  BUN 15 17  CREATININE 1.08 1.00  CALCIUM 9.2  --    Liver Function Tests:  Recent Labs  Lab 03/21/17 1850  AST 24  ALT 22  ALKPHOS 60  BILITOT 0.7  PROT 7.0  ALBUMIN 3.9   Coagulation Studies:  Recent Labs    03/21/17 1850  APTT 27  INR 1.09   Urine Drug Screen:     Component Value Date/Time   LABOPIA NONE DETECTED 03/21/2017 2048   COCAINSCRNUR NONE DETECTED 03/21/2017 2048   LABBENZ NONE DETECTED 03/21/2017 2048   AMPHETMU NONE DETECTED 03/21/2017 2048   THCU NONE DETECTED 03/21/2017 2048   LABBARB NONE DETECTED 03/21/2017 2048    Alcohol Level:  Recent Labs  Lab  03/21/17 1850  ETH <10   PHYSICAL EXAM Temp:  [98.1 F (36.7 C)-98.7 F (37.1 C)] 98.1 F (36.7 C) (01/09 1200) Pulse Rate:  [59-88] 62 (01/09 1600) Resp:  [12-27] 13 (01/09 1600) BP: (105-185)/(49-103) 144/65 (01/09 1600) SpO2:  [90 %-100 %] 99 % (01/09 1600) Weight:  [87.4 kg (192 lb 10.9 oz)] 87.4 kg (192 lb 10.9 oz) (01/08 1900) General - Well nourished, well developed, in no apparent distress HEENT-  Normocephalic, Normal external eye/conjunctiva.  Normal external ears. Normal external nose, mucus membranes and septum.   Cardiovascular - Regular rate and rhythm  Respiratory - Lungs clear bilaterally. No wheezing. Abdomen - soft and non-tender, BS normal Extremities- no edema or cyanosis NEURO:  Mental Status: Alert, awake, oriented to self, place and month.   Language: speech is mildly dysarthric.  Naming is impaired, but repetition, fluency are intact and comprehension intact to simple commands. Cranial Nerves: PERRL. EOMI, visual fields-possible right homonymous hemianopsia/but quadrantanopsia the lower quadrant, no facial asymmetry, facial sensation intact, hearing reduced bilaterally, tongue/uvula/soft palate midline, normal sternocleidomastoid and trapezius muscle strength. No evidence of tongue atrophy or fibrillations Motor: 5/5 all over with no drift Tone: is normal and bulk is normal Sensation-decreased in glove and stocking pattern bilaterally.  There is some extinction to double simultaneous stimulation on the left but the exam was inconsistent. Coordination: FTN intact bilaterally Gait- deferred  IMAGING: I have personally reviewed the radiological images below and agree with the radiology interpretations.  Ct Angio Head and Neck W Or Wo Contrast Result Date: 03/21/2017 IMPRESSION: 1. Negative CTA for emergent large vessel occlusion. 2. Atheromatous plaque about the carotid bifurcations/proximal ICAs bilaterally with associated stenosis of up to 75% on the left and  50% on the right. 3. Atheromatous plaque at the origin of the vertebral arteries bilaterally with associated moderate stenoses. 4. Moderate carotid siphon atherosclerosis with mild to moderate multifocal narrowing. These results were communicated to Rory Percy at 7:44 pmon 1/8/2019by text page via the [facility] messaging system. Electronically Signed   By: Jeannine Boga M.D.   On: 03/21/2017 19:48   Mr Brain Wo Contrast Result Date: 03/21/2017 IMPRESSION: 1. No acute intracranial abnormality. 2. Age-related cerebral atrophy with moderate chronic small vessel ischemic disease. Electronically Signed   By: Jeannine Boga M.D.   On: 03/21/2017 22:15   Dg Chest Port 1 View Result Date: 03/21/2017 IMPRESSION: Enlarged cardiac silhouette. Calcific atherosclerotic disease of the aorta. Electronically Signed   By: Fidela Salisbury M.D.   On: 03/21/2017 20:39   Ct Head Code Stroke Wo Contrast Result Date: 03/21/2017 IMPRESSION: 1. No acute finding. Advanced chronic small-vessel ischemic changes throughout the brain. 2. ASPECTS is 10 3. These results were communicated to Dr. Leonel Ramsay at 7:02 pmon 1/8/2019by text page via the [facility] messaging system. Electronically Signed  By: Nelson Chimes M.D.   On: 03/21/2017 19:03   Echocardiogram:                                               Study Conclusions - Left ventricle: The cavity size was normal. Systolic function was   normal. The estimated ejection fraction was in the range of 55%   to 60%. Left ventricular diastolic function parameters were   normal. - Aortic valve: Valve area (VTI): 2.59 cm^2. Valve area (Vmax):   2.42 cm^2. Valve area (Vmean): 2.2 cm^2. - Mitral valve: Severely calcified annulus. Mildly thickened   leaflets . There was mild regurgitation. - Left atrium: The atrium was moderately dilated. - Atrial septum: No defect or patent foramen ovale was identified.  B/L Carotid U/S:                                                 PENDING Repeat CT Head:                                              PENDING  EEG:                                                                    Impression: This awake and asleep EEG is mildly abnormal due to slowing of the posterior dominant rhythm.There were no epileptiform discharges or electrographic seizures seen.       IMPRESSION: Mr. ZYLEN WENIG is a 82 y.o. male with PMH of documented history of postoperative atrial fibrillation but on no anticoagulants, coronary artery disease status post CABG, congestive heart failure, hyperlipidemia, hypertension, seizure in the setting of a previous stroke in the right MCA territory on the motor strip with no residual deficits, diabetic polyneuropathy and syncope presented to the emergency room for evaluation of garbled speech and confusion. He was within the 4-1/2 hours window for IV TPA. 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 No emergent large vessel occlusion seen on CT angiogram of the head and neck-hence not a candidate for endovascular treatment. MRI reveals no acute findings.  Left hemipsheric TIA vs seizure with subtherapeutic Depakote level:  Suspected Etiology: seizure with subtherapeutic Depakote level vs athero vs cardioembolic source  Resultant Symptoms: garbled speech and confusion. Stroke Risk Factors: diabetes mellitus, hyperlipidemia and hypertension Other Stroke Risk Factors: Advanced age, ETOH use, Obesity, Body mass index is 34.13 kg/m. Hx stroke, CHF, CAD, CABG, Hx Post-op AFIB  Outstanding Stroke Work-up Studies:     B/L Carotid U/S:  Repeat Head CT-:                                                         03/22/2017 ASSESSMENT:   Neuro exam non-focal. MRI negative for stroke. Likely seizure not therapeutic on Depakote. EEG negative for seizure activity. Will complete work-up with incidental finding of B/L ICA  stenosis.  PLAN  03/22/2017: Continue Statin HOLD ASA until 24 hour post tPA neuroimaging is stable & without evidence of bleeding Frequent neuro checks Telemetry monitoring PT/OT/SLP Ongoing aggressive stroke risk factor management Patient counseled to be compliant with his antithrombotic medications Patient counseled on Lifestyle modifications including, Diet, Exercise, and Stress Follow up with [facility] in 6 weeks  HX OF STROKES: right MCA territory on the motor strip with no residual deficits  Bilateral ICA stenosis 75% on the left and 50% on the right. Carotid Duplex pending to verify stenosis  R/O AFIB: May need Outpatient TEE and Loop Recorder Placement  SEIZURES: EEG Negative for seizure activity No seizure activity reported overnight Continue Depakote Ativan PRN for seizure activity and notify Neurology immediately Maintain Seizure precautions  MEDICAL ISSUES: Possible Aspiration PNA - Ruled out -CXR Negative  CKD Stage 3 (GFR 30-59) -Gentle hydration -avoid nephrotoxic agents -Needs OP f/u for abd wall hernia  RESP No active issues, No reported SOB or need for oxygen On ipratropium athome. Continue home dose. Monitor clinically  HYPERTENSION: Stable SBP goal of < 180. DBP goal of < 105.  Nicardipine drip, Labetolol PRN Long term BP goal normotensive. Restart home B/P medications - Metoprolol 03/22/2017 Home Meds: Metoprolol, Cozaar  HYPERLIPIDEMIA:    Component Value Date/Time   CHOL 143 03/22/2017 0402   TRIG 142 03/22/2017 0402   HDL 36 (L) 03/22/2017 0402   CHOLHDL 4.0 03/22/2017 0402   VLDL 28 03/22/2017 0402   LDLCALC 79 03/22/2017 0402  Home Meds:  Niacin, Zetia and Lipitor 40 mg LDL  goal < 70 Continued on Lipitor 40 mg daily, Zetia and Niacin Continue statin medications at discharge  DIABETES: Lab Results  Component Value Date   HGBA1C 6.5 (H) 03/22/2017  HgbA1c goal < 7.0 Currently on: Novolog Continue CBG  monitoring and SSI to maintain glucose 140-180 mg/dl DM education   Hx of ETOH ABUSE CIWA protocol if necessary  OBESITY Obesity, Body mass index is 34.13 kg/m. Greater than/equal to 30  Other Active Problems: Active Problems:   Stroke (cerebrum) [facility])    Hospital day # 1 VTE prophylaxis: SCD's  Diet : Diet Carb Modified Fluid consistency: Thin; Room service appropriate? Yes   FAMILY UPDATES:  family at bedside  TEAM UPDATES: [name], MD   Prior Home Stroke Medications:  aspirin 325 mg daily  Discharge Stroke Meds:  Please discharge patient on TBD   Disposition: 01-Home or Self Care Therapy Recs:               HOME with Outpatient PT/OT Home Equipment:         NONE Follow Up:  Follow-up Information    [name], MD. Schedule an appointment as soon as possible for a visit in 6 week(s).   Specialties:  Neurology, Radiology Contact information: [address] [place] [address] [place],  Rene Kocher, MD -PCP Follow up in 1-2 weeks      Assessment & plan discussed with with attending physician and they are in agreement.    Renie Ora Stroke Neurology Team 03/22/2017 4:42 PM I have personally examined this patient, reviewed notes, independently viewed imaging studies, participated in medical decision making and plan of care.ROS completed by me personally and pertinent positives fully documented  I have made any additions or clarifications directly to the above note. Agree with note above.  He presented with the episode of transient altered mental status and speech difficulties possibly left hemispheric TIA versus complex partial seizure. Brain imaging is negative for stroke but CT angiogram of the neck shows 75% left carotid stenosis. Recommend check carotid ultrasound and if moderate to severe left carotid stenosis is confirmed may need elective left carotid surgery. Continue Depakote for seizure prophylaxis.  Continue ongoing stroke workup. No family available at the bedside for discussion. Continue strict blood pressure control and close neurological monitoring as per post TPA protocol.  This patient is critically ill and at significant risk of neurological worsening, death and care requires constant monitoring of vital signs, hemodynamics,respiratory and cardiac monitoring, extensive review of multiple databases, frequent neurological assessment, discussion with family, other specialists and medical decision making of high complexity.I have made any additions or clarifications directly to the above note.This critical care time does not reflect procedure time, or teaching time or supervisory time of PA/NP/Med Resident etc but could involve care discussion time.  I spent 30 minutes of neurocritical care time  in the care of  this patient.    Antony Contras, MD Medical Director [facility] Pager: [phone] 03/22/2017 5:21 PM  To contact Stroke Continuity provider, please refer to [email or web address] After hours, contact General Neurology

## 2017-03-22 NOTE — ED Provider Notes (Signed)
[facility]NORTH NEURO/TRAUMA/SURGICAL ICU Provider Note   CSN: [id number] Arrival date & time: 03/21/17  1849   An emergency department physician performed an initial assessment on this suspected stroke patient at 1850.  History   Chief Complaint Chief Complaint  Patient presents with  . Code Stroke    HPI Fred [name] is a 82 y.o. male.  HPI   82 y.o. Caucasian male with PMH of HTN, DM, CAD s/p CABG with post operative atrial fibrillation, CHF, PVD, previous strokes, complex partial seizure disorder, presents with concern for episode of difficulty speaking and confusion.  Patient arrived as a Code Stroke.  Family says the patient reported he "didn't feel well today" but was otherwise acting normally, did not have any specific symptoms. Did not have any neurologic concerns earlier today, no fevers, no infectious symptoms. Was in otherwise state of health. Then, around 540, he suddenly developed difficulty saying his words, confusion. He was asking the same questions.  Deny numbness or weakness focally.  Family reports he had some similar symptoms with last stroke. Daughter reports he has started drinking again but he denies drinking today.     Past Medical History:  Diagnosis Date  . Atrial fibrillation ([place])    Post operative  . CAD (coronary artery disease)    s/p cabg  . CHF (congestive heart failure) ([place])   . Diverticulosis   . DJD (degenerative joint disease)   . DM (diabetes mellitus) ([place])   . History of anemia   . History of stroke   . Hyperlipidemia   . Hypertension   . 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 Peripheral vascular disease ([place])   . Polyneuropathy, diabetic ([place])   . Seizure disorder, complex partial ([place])   . Syncope and collapse     Patient Active Problem List   Diagnosis Date Noted  . Stroke (cerebrum) ([place]) 03/21/2017  . Episode of confusion 10/04/2016  . Type 2 diabetes mellitus with complication, with long-term current use of  insulin ([name]) 03/27/2015  . Atherosclerosis of native coronary artery of native heart without angina pectoris 03/27/2015  . Palpitations 11/04/2014  . S/P CABG (coronary artery bypass graft) 11/04/2014  . Atrial fibrillation, unspecified   . TIA (transient ischemic attack) 09/04/2014  . History of stroke 09/04/2014  . Obesity (BMI 30-39.9) 11/29/2013  . History of gout 05/16/2013  . Congestive heart failure ([place]) 04/20/2009  . DYSPNEA 02/11/2009  . CARDIOVASCULAR FUNCTION STUDY, ABNORMAL 01/29/2009  . NECK PAIN 08/03/2007  . ANEMIA / OTHER 05/16/2007  . Coronary atherosclerosis 05/16/2007  . PAROXYSMAL ATRIAL FIBRILLATION 05/16/2007  . STROKE 05/16/2007  . Hyperlipidemia 03/30/2007  . DEGENERATIVE JOINT DISEASE 03/30/2007  . PAROTID LESION, UNSPECIFIED 09/29/2006  . Partial epilepsy with impairment of consciousness ([place]) 09/29/2006  . POLYNEUROPATHY-DIABETIC 09/29/2006  . Essential hypertension 09/29/2006  . PERIPHERAL VASCULAR DISEASE 09/29/2006  . DIVERTICULOSIS 09/29/2006    Past Surgical History:  Procedure Laterality Date  . CATARACT EXTRACTION W/ INTRAOCULAR LENS  IMPLANT, BILATERAL  06/2013   Dr. [name]  . CORONARY ARTERY BYPASS GRAFT  02/13/2004   4 vesel  . removal of sebaceous cyst from neck  08/2007   Dr. [name]  . TONSILLECTOMY         Home Medications    Prior to Admission medications   Medication Sig Start Date End Date Taking? Authorizing Provider  allopurinol (ZYLOPRIM) 300 MG tablet Take 1 tablet (300 mg total) by mouth daily. 08/02/16   [place],  Rene Kocher, MD  AMBULATORY NON FORMULARY MEDICATION Medication Name: Strips for glucometer to test once a day. He doesn't know name of meter. E11.8 11/03/16   [name], MD  aspirin 325 MG tablet Take 325 mg by mouth daily.    [provider]  atorvastatin (LIPITOR) 40 MG tablet Take 1 tablet (40 mg total) by mouth daily. 08/02/16   [name], MD  Coenzyme Q10 (COQ10) 30  MG CAPS Take 1 tablet by mouth daily.      [provider]  divalproex (DEPAKOTE ER) 500 MG 24 hr tablet Take 1 tablet (500 mg total) by mouth daily. 08/02/16   [name], MD  ezetimibe (ZETIA) 10 MG tablet Take 1 tablet (10 mg total) by mouth daily. 11/09/16   [name], MD  furosemide (LASIX) 40 MG tablet TAKE 1 TABLET BY MOUTH EVERY DAY AS NEEDED FOR FLUID RETENTION 10/15/15   [name], MD  glimepiride (AMARYL) 4 MG tablet TAKE 1 TABLET BY MOUTH EVERY DAY BEFORE BREAKFAST 08/02/16   [name], MD  Insulin Glargine (LANTUS SOLOSTAR) 100 UNIT/ML Solostar Pen Inject 10-20 Units into the skin daily at 10 pm. Patient not taking: Reported on 03/21/2017 08/02/16   [name], MD  Insulin Pen Needle (PEN NEEDLES) 32G X 6 MM MISC 1 each by Does not apply route daily. Use to inject insulin daily. Dx: E11.8 08/05/16   [name], MD  ipratropium (ATROVENT) 0.03 % nasal spray USE 2 SPRAYS IN EACH NOSTRIL EVERY 12 HOURS 02/24/16   [name], MD  losartan (COZAAR) 25 MG tablet Take 1 tablet (25 mg total) by mouth daily. 08/02/16   [name], MD  metFORMIN (GLUCOPHAGE) 1000 MG tablet Take 1 tablet (1,000 mg total) by mouth 2 (two) times daily with a meal. 08/02/16   [name], MD  metoprolol succinate (TOPROL-XL) 50 MG 24 hr tablet TAKE 1 TABLET BY MOUTH EVERY DAY 01/12/15   [name], MD  niacin (NIASPAN) 500 MG CR tablet Take 1 tablet (500 mg total) by mouth at bedtime. 08/02/16   [name], MD  sitaGLIPtin (JANUVIA) 100 MG tablet Take 1 tablet (100 mg total) by mouth daily. 08/02/16   [name], MD    Family History Family History  Problem Relation Age of Onset  . Epilepsy Father   . Stroke Mother   . Lung cancer Unknown     Social History Social History   Tobacco Use  . Smoking status: Former Smoker    Packs/day: 1.00    Years: 22.00    Pack years: 22.00     Types: Cigarettes    Last attempt to quit: 03/14/1977    Years since quitting: 40.0  . Smokeless tobacco: Never Used  Substance Use Topics  . Alcohol use: No  . Drug use: No     Allergies   Lisinopril   Review of Systems Review of Systems  Constitutional: Positive for fatigue. Negative for fever.  HENT: Negative for sore throat.   Eyes: Negative for visual disturbance.  Respiratory: Negative for shortness of breath.   Cardiovascular: Negative for chest pain.  Gastrointestinal: Negative for abdominal pain, diarrhea, nausea and vomiting.  Genitourinary: Negative for difficulty urinating.  Musculoskeletal: Negative for back pain and neck stiffness.  Skin: Negative for rash.  Neurological: Positive for speech difficulty. Negative for syncope and headaches.     Physical Exam Updated Vital Signs BP Marland Kitchen)  138/55   Pulse 70   Temp 98.7 F (37.1 C) (Oral)   Resp 15   Ht 5\' 3"  (1.6 m)   Wt 87.4 kg (192 lb 10.9 oz)   SpO2 100%   BMI 34.13 kg/m   Physical Exam  Constitutional: He appears well-developed and well-nourished. No distress.  HENT:  Head: Normocephalic and atraumatic.  Eyes: Conjunctivae and EOM are normal.  Neck: Normal range of motion.  Cardiovascular: Normal rate, regular rhythm, normal heart sounds and intact distal pulses. Exam reveals no gallop and no friction rub.  No murmur heard. Pulmonary/Chest: Effort normal and breath sounds normal. No respiratory distress. He has no wheezes. He has no rales.  Abdominal: Soft. He exhibits no distension. There is no tenderness. There is no guarding.  Musculoskeletal: He exhibits no edema.  Neurological: He is alert. He has normal strength. No cranial nerve deficit or sensory deficit. Coordination normal.  Inconsistent neglect Word finding difficulties, cannot name objects or describe scene, slight slurring  Skin: Skin is warm and dry. He is not diaphoretic.  Nursing note and vitals reviewed.    ED Treatments /  Results  Labs (all labs ordered are listed, but only abnormal results are displayed) Labs Reviewed  COMPREHENSIVE METABOLIC PANEL - Abnormal; Notable for the following components:      Result Value   Glucose, Bld 172 (*)    All other components within normal limits  URINALYSIS, ROUTINE W REFLEX MICROSCOPIC - Abnormal; Notable for the following components:   Color, Urine STRAW (*)    Glucose, UA 50 (*)    All other components within normal limits  VALPROIC ACID LEVEL - Abnormal; Notable for the following components:   Valproic Acid Lvl <10 (*)    All other components within normal limits  I-STAT CHEM 8, ED - Abnormal; Notable for the following components:   Glucose, Bld 166 (*)    All other components within normal limits  MRSA PCR SCREENING  ETHANOL  PROTIME-INR  APTT  CBC  DIFFERENTIAL  RAPID URINE DRUG SCREEN, HOSP PERFORMED  HEMOGLOBIN A1C  LIPID PANEL  I-STAT TROPONIN, ED    EKG  EKG Interpretation  Date/Time:  Tuesday March 21 2017 19:15:45 EST Ventricular Rate:  78 PR Interval:    QRS Duration: 101 QT Interval:  426 QTC Calculation: 486 R Axis:   -48 Text Interpretation:  Sinus rhythm LAD, consider left anterior fascicular block Abnormal R-wave progression, late transition Borderline prolonged QT interval No significant change since last tracing Confirmed by Gareth Morgan [id number]) on 03/22/2017 3:46:42 AM       Radiology Ct Angio Head W Or Wo Contrast  Result Date: 03/21/2017 CLINICAL DATA:  Initial evaluation for acute aphasia. EXAM: CT ANGIOGRAPHY HEAD AND NECK TECHNIQUE: Multidetector CT imaging of the head and neck was performed using the standard protocol during bolus administration of intravenous contrast. Multiplanar CT image reconstructions and MIPs were obtained to evaluate the vascular anatomy. Carotid stenosis measurements (when applicable) are obtained utilizing NASCET criteria, using the distal internal carotid diameter as the denominator. CONTRAST:   62mL ISOVUE-370 IOPAMIDOL (ISOVUE-370) INJECTION 76% COMPARISON:  Prior CT from earlier the same day. FINDINGS: CTA NECK FINDINGS Aortic arch: Visualized aortic arch of normal caliber with normal 3 vessel morphology. Calcified aortic atherosclerosis without flow-limiting stenosis about the origin of the great vessels. Visualized subclavian arteries widely patent. Right carotid system: Scattered plaque within the right common carotid artery without flow-limiting stenosis. Atheromatous stenosis of up to approximately 50% by NASCET  criteria at the origin of the right ICA. Right ICA tortuous but otherwise patent to the skull base. Left carotid system: Left common carotid artery tortuous proximally with scattered atheromatous irregularity but no high-grade stenosis. Atheromatous stenosis of up to 75% by NASCET criteria at the proximal left ICA. Left ICA tortuous but otherwise patent to the skull base without high-grade stenosis or occlusion. Vertebral arteries: Both of the vertebral arteries arise from the subclavian arteries. Focal plaque at the origin of the vertebral arteries bilaterally. Associated moderate approximate 50% stenosis at the origins of the vertebral arteries bilaterally. Right vertebral artery dominant. Vertebral arteries otherwise patent within the neck without stenosis or occlusion. Skeleton: No acute osseus abnormality. No worrisome lytic or blastic osseous lesions. Moderate degenerate spondylolysis noted at C6-7. Other neck: Soft tissues of the neck demonstrate no acute abnormality. Salivary glands normal. Thyroid normal. No adenopathy. Upper chest: Upper chest within normal limits. Visualized lungs are clear. Median sternotomy wires noted. Review of the MIP images confirms the above findings CTA HEAD FINDINGS Anterior circulation: Petrous segments patent bilaterally without stenosis. Multifocal calcified plaque throughout the carotid siphons with secondary mild to moderate multifocal narrowing,  most notable at the supraclinoid segments bilaterally. ICA termini widely patent. A1 segments patent without flow-limiting stenosis. Normal anterior communicating artery. Anterior cerebral arteries widely patent to their distal aspects without stenosis. M1 segments patent without stenosis or occlusion. Left M1 bifurcates early. Normal MCA bifurcations. No proximal M2 occlusion. Distal MCA branches well perfused and symmetric. Posterior circulation: Bilateral V4 atheromatous plaque with mild multifocal stenoses. Right vertebral artery dominant. Patent right PICA. Left PICA not visualized. Basilar artery patent to its distal aspect without stenosis. Superior cerebral arteries patent bilaterally. Both of the posterior cerebral arteries primarily supplied via the basilar. Atheromatous irregularity throughout the P2 segments bilaterally without flow-limiting stenosis. Venous sinuses: Grossly patent. Anatomic variants: None significant.  No aneurysm. Delayed phase: Not performed. Review of the MIP images confirms the above findings IMPRESSION: 1. Negative CTA for emergent large vessel occlusion. 2. Atheromatous plaque about the carotid bifurcations/proximal ICAs bilaterally with associated stenosis of up to 75% on the left and 50% on the right. 3. Atheromatous plaque at the origin of the vertebral arteries bilaterally with associated moderate stenoses. 4. Moderate carotid siphon atherosclerosis with mild to moderate multifocal narrowing. These results were communicated to Rory Percy at 7:44 pmon 1/8/2019by text page via the [facility] messaging system. Electronically Signed   By: Jeannine Boga M.D.   On: 03/21/2017 19:48   Ct Angio Neck W Or Wo Contrast  Result Date: 03/21/2017 CLINICAL DATA:  Initial evaluation for acute aphasia. EXAM: CT ANGIOGRAPHY HEAD AND NECK TECHNIQUE: Multidetector CT imaging of the head and neck was performed using the standard protocol during bolus administration of intravenous contrast.  Multiplanar CT image reconstructions and MIPs were obtained to evaluate the vascular anatomy. Carotid stenosis measurements (when applicable) are obtained utilizing NASCET criteria, using the distal internal carotid diameter as the denominator. CONTRAST:  56mL ISOVUE-370 IOPAMIDOL (ISOVUE-370) INJECTION 76% COMPARISON:  Prior CT from earlier the same day. FINDINGS: CTA NECK FINDINGS Aortic arch: Visualized aortic arch of normal caliber with normal 3 vessel morphology. Calcified aortic atherosclerosis without flow-limiting stenosis about the origin of the great vessels. Visualized subclavian arteries widely patent. Right carotid system: Scattered plaque within the right common carotid artery without flow-limiting stenosis. Atheromatous stenosis of up to approximately 50% by NASCET criteria at the origin of the right ICA. Right ICA tortuous but otherwise patent to the skull  base. Left carotid system: Left common carotid artery tortuous proximally with scattered atheromatous irregularity but no high-grade stenosis. Atheromatous stenosis of up to 75% by NASCET criteria at the proximal left ICA. Left ICA tortuous but otherwise patent to the skull base without high-grade stenosis or occlusion. Vertebral arteries: Both of the vertebral arteries arise from the subclavian arteries. Focal plaque at the origin of the vertebral arteries bilaterally. Associated moderate approximate 50% stenosis at the origins of the vertebral arteries bilaterally. Right vertebral artery dominant. Vertebral arteries otherwise patent within the neck without stenosis or occlusion. Skeleton: No acute osseus abnormality. No worrisome lytic or blastic osseous lesions. Moderate degenerate spondylolysis noted at C6-7. Other neck: Soft tissues of the neck demonstrate no acute abnormality. Salivary glands normal. Thyroid normal. No adenopathy. Upper chest: Upper chest within normal limits. Visualized lungs are clear. Median sternotomy wires noted. Review  of the MIP images confirms the above findings CTA HEAD FINDINGS Anterior circulation: Petrous segments patent bilaterally without stenosis. Multifocal calcified plaque throughout the carotid siphons with secondary mild to moderate multifocal narrowing, most notable at the supraclinoid segments bilaterally. ICA termini widely patent. A1 segments patent without flow-limiting stenosis. Normal anterior communicating artery. Anterior cerebral arteries widely patent to their distal aspects without stenosis. M1 segments patent without stenosis or occlusion. Left M1 bifurcates early. Normal MCA bifurcations. No proximal M2 occlusion. Distal MCA branches well perfused and symmetric. Posterior circulation: Bilateral V4 atheromatous plaque with mild multifocal stenoses. Right vertebral artery dominant. Patent right PICA. Left PICA not visualized. Basilar artery patent to its distal aspect without stenosis. Superior cerebral arteries patent bilaterally. Both of the posterior cerebral arteries primarily supplied via the basilar. Atheromatous irregularity throughout the P2 segments bilaterally without flow-limiting stenosis. Venous sinuses: Grossly patent. Anatomic variants: None significant.  No aneurysm. Delayed phase: Not performed. Review of the MIP images confirms the above findings IMPRESSION: 1. Negative CTA for emergent large vessel occlusion. 2. Atheromatous plaque about the carotid bifurcations/proximal ICAs bilaterally with associated stenosis of up to 75% on the left and 50% on the right. 3. Atheromatous plaque at the origin of the vertebral arteries bilaterally with associated moderate stenoses. 4. Moderate carotid siphon atherosclerosis with mild to moderate multifocal narrowing. These results were communicated to Rory Percy at 7:44 pmon 1/8/2019by text page via the [facility] messaging system. Electronically Signed   By: Jeannine Boga M.D.   On: 03/21/2017 19:48   Mr Brain Wo Contrast  Result Date:  03/21/2017 CLINICAL DATA:  Initial evaluation for acute stroke, episode of confusion, speech difficulty. EXAM: MRI HEAD WITHOUT CONTRAST TECHNIQUE: Multiplanar, multiecho pulse sequences of the brain and surrounding structures were obtained without intravenous contrast. COMPARISON:  Prior study from earlier same day. FINDINGS: Brain: Study degraded by motion artifact. Age-related cerebral atrophy with moderate chronic small vessel ischemic disease. Few small remote lacunar infarcts noted within the bilateral thalami. No abnormal foci of restricted diffusion to suggest acute or subacute ischemia. Gray-white matter differentiation maintained. No evidence for acute or chronic intracranial hemorrhage. No mass lesion, midline shift or mass effect. Mild ventricular prominence related to global parenchymal volume loss hydrocephalus. No extra-axial fluid collection. Major dural sinuses are grossly patent. Pituitary gland suprasellar region normal. Midline structures intact and normal. Vascular: Major intracranial vascular flow voids are patent. Skull and upper cervical spine: Craniocervical junction normal. Upper cervical spine within normal limits. Bone marrow signal intensity normal. No scalp soft tissue abnormality. Sinuses/Orbits: Globes and orbital soft tissues normal. Patient status post lens extraction bilaterally. Paranasal sinuses are  clear. No significant mastoid effusion. Inner ear structures normal. Other: None. IMPRESSION: 1. No acute intracranial abnormality. 2. Age-related cerebral atrophy with moderate chronic small vessel ischemic disease. Electronically Signed   By: Jeannine Boga M.D.   On: 03/21/2017 22:15   Dg Chest Port 1 View  Result Date: 03/21/2017 CLINICAL DATA:  Stroke. EXAM: PORTABLE CHEST 1 VIEW COMPARISON:  05/09/2016 FINDINGS: Postsurgical changes from CABG. Enlarged cardiac silhouette. Tortuosity and calcific atherosclerotic disease of the aorta. Mediastinal contours appear intact.  There is no evidence of focal airspace consolidation, pleural effusion or pneumothorax. Osseous structures are without acute abnormality. Soft tissues are grossly normal. IMPRESSION: Enlarged cardiac silhouette. Calcific atherosclerotic disease of the aorta. Electronically Signed   By: Fidela Salisbury M.D.   On: 03/21/2017 20:39   Ct Head Code Stroke Wo Contrast  Result Date: 03/21/2017 CLINICAL DATA:  Code stroke. Acute presentation with aphasia. Last seen normal less than 2 hours ago EXAM: CT HEAD WITHOUT CONTRAST TECHNIQUE: Contiguous axial images were obtained from the base of the skull through the vertex without intravenous contrast. COMPARISON:  09/03/2014 CT.  09/04/2014 MRI. FINDINGS: Brain: Generalized atrophy. Advanced chronic small-vessel ischemic changes throughout the pons in the cerebral hemispheric white matter. No cortical or large vessel territory infarction. No mass lesion, hemorrhage, hydrocephalus or extra-axial collection. Vascular: There is atherosclerotic calcification of the major vessels at the base of the brain. Skull: Normal Sinuses/Orbits: Clear/normal Other: None ASPECTS ([place] Stroke Program Early CT Score) - Ganglionic level infarction (caudate, lentiform nuclei, internal capsule, insula, M1-M3 cortex): 7 - Supraganglionic infarction (M4-M6 cortex): 3 Total score (0-10 with 10 being normal): 10 IMPRESSION: 1. No acute finding. Advanced chronic small-vessel ischemic changes throughout the brain. 2. ASPECTS is 10 3. These results were communicated to Dr. Leonel Ramsay at 7:02 pmon 1/8/2019by text page via the [facility] messaging system. Electronically Signed   By: Nelson Chimes M.D.   On: 03/21/2017 19:03    Procedures .Critical Care Performed by: Gareth Morgan, MD Authorized by: Gareth Morgan, MD   Comments:     CRITICAL CARE: Code stroke, tPA given Performed by: Tennis Must   Total critical care time: 30 minutes  Critical care time was exclusive of  separately billable procedures and treating other patients.  Critical care was necessary to treat or prevent imminent or life-threatening deterioration.  Critical care was time spent personally by me on the following activities: development of treatment plan with patient and/or surrogate as well as nursing, discussions with consultants, evaluation of patient's response to treatment, examination of patient, obtaining history from patient or surrogate, ordering and performing treatments and interventions, ordering and review of laboratory studies, ordering and review of radiographic studies, pulse oximetry and re-evaluation of patient's condition.    (including critical care time)  Medications Ordered in ED Medications   stroke: mapping our early stages of recovery book (not administered)  0.9 %  sodium chloride infusion ( Intravenous Restarted 03/22/17 0306)  acetaminophen (TYLENOL) tablet 650 mg (not administered)    Or  acetaminophen (TYLENOL) solution 650 mg (not administered)    Or  acetaminophen (TYLENOL) suppository 650 mg (not administered)  senna-docusate (Senokot-S) tablet 1 tablet (not administered)  labetalol (NORMODYNE,TRANDATE) injection 20 mg (20 mg Intravenous Not Given 03/21/17 2210)    And  clevidipine (CLEVIPREX) infusion 0.5 mg/mL (0 mg/hr Intravenous Hold 03/21/17 1953)  allopurinol (ZYLOPRIM) tablet 300 mg (not administered)  atorvastatin (LIPITOR) tablet 40 mg (not administered)  divalproex (DEPAKOTE ER) 24 hr tablet 500 mg (not  administered)  ezetimibe (ZETIA) tablet 10 mg (not administered)  insulin glargine (LANTUS) injection 5 Units (not administered)  ipratropium (ATROVENT) 0.06 % nasal spray 1 spray (not administered)  metoprolol succinate (TOPROL-XL) 24 hr tablet 50 mg (not administered)  niacin (NIASPAN) CR tablet 500 mg (not administered)  iopamidol (ISOVUE-370) 76 % injection (50 mLs  Contrast Given 03/21/17 1915)  alteplase (ACTIVASE) 1 mg/mL infusion 79 mg (0  mg/kg  87.4 kg Intravenous Stopped 03/21/17 2028)     Initial Impression / Assessment and Plan / ED Course  I have reviewed the triage vital signs and the nursing notes.  Pertinent labs & imaging results that were available during my care of the patient were reviewed by me and considered in my medical decision making (see chart for details).      82 y.o. Caucasian male with PMH of HTN, DM, CAD s/p CABG with post operative atrial fibrillation, CHF, PVD, previous strokes, complex partial seizure disorder, presents with concern for episode of difficulty speaking and confusion beginning around 540PM.  Patient was called a a Code Stroke and Neurology present on arrival. CT without acute abnormalities.  Patient with difficulty finding words, naming things, describing scene on stroke scale card, slight dysarthria. Concern symptoms represent CVA, and with patient within stroke window, Dr. Rory Percy discussed exact timing of symptoms as well as risks and benefits of tPA and family consented to tPA.  Neurology to admit for further care.    Final Clinical Impressions(s) / ED Diagnoses   Final diagnoses:  Stroke (cerebrum) [facility])    ED Discharge Orders    None       Gareth Morgan, MD 03/22/17 [phone]

## 2017-03-23 ENCOUNTER — Inpatient Hospital Stay (HOSPITAL_COMMUNITY): Payer: Medicare Other

## 2017-03-23 DIAGNOSIS — I6300 Cerebral infarction due to thrombosis of unspecified precerebral artery: Secondary | ICD-10-CM

## 2017-03-23 LAB — GLUCOSE, CAPILLARY
Glucose-Capillary: 106 mg/dL — ABNORMAL HIGH (ref 65–99)
Glucose-Capillary: 183 mg/dL — ABNORMAL HIGH (ref 65–99)

## 2017-03-23 MED ORDER — DIVALPROEX SODIUM ER 500 MG PO TB24: 500.0000 mg | ORAL_TABLET | Freq: Every day | ORAL | 1 refills | Status: DC

## 2017-03-23 NOTE — Discharge Summary (Signed)
Stroke Discharge Summary  Patient ID: avant printy   MRN: [id number]      DOB: 12-Aug-1935  Date of Admission: 03/21/2017 Date of Discharge: 03/23/2017  Attending Physician:  [name], MD, Stroke MD Consultant(s):    None  Patient's PCP:  Fred Marry, MD  DISCHARGE DIAGNOSIS:  Active Problems:   Stroke like episode treated with IV TPA with full recovery but no infarct seen on MRI Possible complex partial Seizure Disorder Hypertension Hyperlipidemia Diabetes Mellitus CKD Stage 3  Past Medical History:  Diagnosis Date  . Atrial fibrillation ([name])    Post operative  . CAD (coronary artery disease)    s/p cabg  . CHF (congestive heart failure) ([name])   . Diverticulosis   . DJD (degenerative joint disease)   . DM (diabetes mellitus) ([name])   . History of anemia   . History of stroke   . Hyperlipidemia   . Hypertension   . 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 Peripheral vascular disease ([name])   . Polyneuropathy, diabetic ([name])   . Seizure disorder, complex partial ([name])   . Syncope and collapse    Past Surgical History:  Procedure Laterality Date  . CATARACT EXTRACTION W/ INTRAOCULAR LENS  IMPLANT, BILATERAL  06/2013   Dr. [name]  . CORONARY ARTERY BYPASS GRAFT  02/13/2004   4 vesel  . removal of sebaceous cyst from neck  08/2007   Dr. [name]  . TONSILLECTOMY     Allergies as of 03/23/2017      Reactions   Lisinopril Cough      Medication List    TAKE these medications   allopurinol 300 MG tablet Commonly known as:  ZYLOPRIM Take 1 tablet (300 mg total) by mouth daily.   aspirin 325 MG tablet Take 325 mg by mouth daily.   atorvastatin 40 MG tablet Commonly known as:  LIPITOR Take 1 tablet (40 mg total) by mouth daily.   CoQ10 30 MG Caps Take 1 tablet by mouth daily.   divalproex 500 MG 24 hr tablet Commonly known as:  DEPAKOTE ER Take 1 tablet (500 mg total) by mouth daily. Start taking on:  03/24/2017    ezetimibe 10 MG tablet Commonly known as:  ZETIA Take 1 tablet (10 mg total) by mouth daily.   furosemide 40 MG tablet Commonly known as:  LASIX TAKE 1 TABLET BY MOUTH EVERY DAY AS NEEDED FOR FLUID RETENTION What changed:  See the new instructions.   glimepiride 4 MG tablet Commonly known as:  AMARYL TAKE 1 TABLET BY MOUTH EVERY DAY BEFORE BREAKFAST What changed:    how much to take  how to take this  when to take this  additional instructions   Insulin Glargine 100 UNIT/ML Solostar Pen Commonly known as:  LANTUS SOLOSTAR Inject 10-20 Units into the skin daily at 10 pm.   ipratropium 0.03 % nasal spray Commonly known as:  ATROVENT USE 2 SPRAYS IN EACH NOSTRIL EVERY 12 HOURS   losartan 25 MG tablet Commonly known as:  COZAAR Take 1 tablet (25 mg total) by mouth daily.   metFORMIN 1000 MG tablet Commonly known as:  GLUCOPHAGE Take 1 tablet (1,000 mg total) by mouth 2 (two) times daily with a meal.   metoprolol succinate 50 MG 24 hr tablet Commonly known as:  TOPROL-XL TAKE 1 TABLET BY MOUTH EVERY DAY What changed:    how much to take  how to take this  when to take this   niacin 500 MG CR tablet Commonly known as:  NIASPAN Take 1 tablet (500 mg total) by mouth at bedtime.   sitaGLIPtin 100 MG tablet Commonly known as:  JANUVIA Take 1 tablet (100 mg total) by mouth daily.      LABORATORY STUDIES CBC    Component Value Date/Time   WBC 8.3 03/21/2017 1850   RBC 4.51 03/21/2017 1850   HGB 15.0 03/21/2017 1900   HCT 44.0 03/21/2017 1900   PLT 235 03/21/2017 1850   MCV 94.7 03/21/2017 1850   MCH 31.7 03/21/2017 1850   MCHC 33.5 03/21/2017 1850   RDW 12.9 03/21/2017 1850   LYMPHSABS 3.2 03/21/2017 1850   MONOABS 0.7 03/21/2017 1850   EOSABS 0.3 03/21/2017 1850   BASOSABS 0.1 03/21/2017 1850   CMP    Component Value Date/Time   NA 141 03/21/2017 1900   K 4.5 03/21/2017 1900   CL 103 03/21/2017 1900   CO2 24 03/21/2017 1850   GLUCOSE 166 (H)  03/21/2017 1900   BUN 17 03/21/2017 1900   CREATININE 1.00 03/21/2017 1900   CREATININE 1.18 11/09/2016 1014   CALCIUM 9.2 03/21/2017 1850   PROT 7.0 03/21/2017 1850   ALBUMIN 3.9 03/21/2017 1850   AST 24 03/21/2017 1850   ALT 22 03/21/2017 1850   ALKPHOS 60 03/21/2017 1850   BILITOT 0.7 03/21/2017 1850   GFRNONAA >60 03/21/2017 1850   GFRNONAA 58 (L) 11/09/2016 1014   GFRAA >60 03/21/2017 1850   GFRAA 67 11/09/2016 1014   COAGS Lab Results  Component Value Date   INR 1.09 03/21/2017   INR 1.2 ratio (H) 01/29/2009   Lipid Panel    Component Value Date/Time   CHOL 143 03/22/2017 0402   TRIG 142 03/22/2017 0402   HDL 36 (L) 03/22/2017 0402   CHOLHDL 4.0 03/22/2017 0402   VLDL 28 03/22/2017 0402   LDLCALC 79 03/22/2017 0402   HgbA1C  Lab Results  Component Value Date   HGBA1C 6.5 (H) 03/22/2017   Urinalysis    Component Value Date/Time   COLORURINE STRAW (A) 03/21/2017 2048   APPEARANCEUR CLEAR 03/21/2017 2048   LABSPEC 1.026 03/21/2017 2048   PHURINE 5.0 03/21/2017 2048   GLUCOSEU 50 (A) 03/21/2017 2048   GLUCOSEU NEGATIVE 05/16/2013 1009   HGBUR NEGATIVE 03/21/2017 2048   BILIRUBINUR NEGATIVE 03/21/2017 2048   KETONESUR NEGATIVE 03/21/2017 2048   PROTEINUR NEGATIVE 03/21/2017 2048   UROBILINOGEN 0.2 05/16/2013 1009   NITRITE NEGATIVE 03/21/2017 2048   LEUKOCYTESUR NEGATIVE 03/21/2017 2048   Urine Drug Screen     Component Value Date/Time   LABOPIA NONE DETECTED 03/21/2017 2048   COCAINSCRNUR NONE DETECTED 03/21/2017 2048   LABBENZ NONE DETECTED 03/21/2017 2048   AMPHETMU NONE DETECTED 03/21/2017 2048   THCU NONE DETECTED 03/21/2017 2048   LABBARB NONE DETECTED 03/21/2017 2048    Alcohol Level    Component Value Date/Time   ETH <10 03/21/2017 1850   SIGNIFICANT DIAGNOSTIC STUDIES Ct Angio Head and Neck W Or Wo Contrast Result Date: 03/21/2017 IMPRESSION: 1. Negative CTA for emergent large vessel occlusion. 2. Atheromatous plaque about the carotid  bifurcations/proximal ICAs bilaterally with associated stenosis of up to 75% on the left and 50% on the right. 3. Atheromatous plaque at the origin of the vertebral arteries bilaterally with associated moderate stenoses. 4. Moderate carotid siphon atherosclerosis with mild to moderate multifocal narrowing. These results were communicated to Rory Percy at 7:44 pmon 1/8/2019by text page via the [facility]  messaging system. Electronically Signed   By: Jeannine Boga M.D.   On: 03/21/2017 19:48   Mr Brain Wo Contrast Result Date: 03/21/2017 IMPRESSION: 1. No acute intracranial abnormality. 2. Age-related cerebral atrophy with moderate chronic small vessel ischemic disease. Electronically Signed   By: Jeannine Boga M.D.   On: 03/21/2017 22:15   Dg Chest Port 1 View Result Date: 03/21/2017 IMPRESSION: Enlarged cardiac silhouette. Calcific atherosclerotic disease of the aorta. Electronically Signed   By: Fidela Salisbury M.D.   On: 03/21/2017 20:39   Ct Head Code Stroke Wo Contrast Result Date: 03/21/2017 IMPRESSION: 1. No acute finding. Advanced chronic small-vessel ischemic changes throughout the brain. 2. ASPECTS is 10 3. These results were communicated to Dr. Leonel Ramsay at 7:02 pmon 1/8/2019by text page via the [facility] messaging system. Electronically Signed   By: Nelson Chimes M.D.   On: 03/21/2017 19:03   Echocardiogram:                                               Study Conclusions - Left ventricle: The cavity size was normal. Systolic function was normal. The estimated ejection fraction was in the range of 55% to 60%. Left ventricular diastolic function parameters were normal. - Aortic valve: Valve area (VTI): 2.59 cm^2. Valve area (Vmax): 2.42 cm^2. Valve area (Vmean): 2.2 cm^2. - Mitral valve: Severely calcified annulus. Mildly thickened leaflets . There was mild regurgitation. - Left atrium: The atrium was moderately dilated. - Atrial septum: No defect or patent foramen  ovale was identified.  B/L Carotid U/S:                                                Final Interpretation: Right Carotid: There is evidence in the right ICA of a 1-39% stenosis. Left Carotid: There is evidence in the left ICA of a 1-39% stenosis. Vertebrals: Both vertebral arteries were patent with antegrade flow.  Repeat CT Head:                                               IMPRESSION: 1. No acute intracranial process. 2. Stable examination including moderate to severe parenchymal brain volume loss and moderate to severe chronic small vessel ischemic Disease  EEG:                                                                    Impression: This awake andasleepEEG is mildly abnormal due to slowing of the posterior dominant rhythm.There were no epileptiform discharges or electrographic seizures seen.    HISTORY OF PRESENT ILLNESS    &    HOSPITAL COURSE ADMISSION HISTORY: SI Fred [name] is a 82 y.o. male who has a documented history of postoperative atrial fibrillation but is on no anticoagulation, coronary artery disease status post CABG, congestive heart failure, hyperlipidemia, hypertension, seizure in the setting of a previous stroke  with no residual deficits, diabetic polyneuropathy, syncope, who presented to the emergency room for evaluation of an episode of garbled speech and confusion that the family noted around 5:40 PM on 03/21/2017.  He was feeling unwell for most part of the day today but at around 5 PM was seen to be has normal self without any confusion or speech problems.  At around 5:40 PM a family member noticed that he was repeatedly asking the same questions and was appearing frustrated.  He had a can of suffering in his hand that he crushed, no seizure activity but seem like his fist was clenched at the time.  His prior stroke was in 2016 with similar presentation and a seizure but further workup revealed that he had a stroke, which was cortical and probably caused  seizure as the presenting symptom.  His initial history provided by the EMS was not very clear for any focality.  He was taken for a noncontrast CT of the head that showed an aspects of 10.  CT angiogram head and neck, preliminary review did not show any large vessel occlusion.  Repeat examination in the room after the CT scan showed the patient to be aphasic and also with probable right-sided neglect and right homonymous hemianopsia along with some extinction, which interestingly was more on the left side on double simultaneous stimulation.  Given that his prior presentation of the stroke was with confusion and seizure, this presentation is also concerning for a stroke.  This was discussed in detail with the family members at bedside and the risks and benefits were using IV TPA were discussed as he was still within the 4-1/2-hour window from last seen normal.  The family agreed to pursuing treatment with IV TPA.  IV TPA was initiated-bolus at 1928 hrs. on 03/21/2017. Delays in TPA administration- unclear history, unclear last known normal that had to be verified with family members over the phone as the family members at bedside were not very clear on the time, atypical presentation  LKW: 1700 hrs. on 03/21/2017 tpa given?:  Premorbid modified Rankin scale (mRS): 0 NIHSS 1a Level of Conscious.: 0 1b LOC Questions: 1 1c LOC Commands: 0 2 Best Gaze: 0 3 Visual: 1 4 Facial Palsy: 0 5a Motor Arm - left: 0 5b Motor Arm - Right: 0 6a Motor Leg - Left: 0 6b Motor Leg - Right: 0 7 Limb Ataxia: 0 8 Sensory: 0 9 Best Language: 1 10 Dysarthria: 1 11 Extinct. and Inatten.: 1 TOTAL: 5  IMPRESSION: [name] is a 82 y.o. male with PMH of documented history of postoperative atrial fibrillation but on no anticoagulants, coronary artery disease status post CABG, congestive heart failure, hyperlipidemia, hypertension, seizure in the setting of a previous stroke in the right MCA territory on the motor  strip with no residual deficits, diabetic polyneuropathy and syncope presented to the emergency room for evaluation of garbled speech and confusion. He was within the 4-1/2 hours window for IV TPA. 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 No emergent large vessel occlusion seen on CT angiogram of the head and neck-hence not a candidate for endovascular treatment. MRI reveals no acute findings.  Left hemipsheric TIA vs seizure with subtherapeutic Depakote level:  Suspected Etiology: seizure with subtherapeutic Depakote level vs athero vs cardioembolic source  Resultant Symptoms: garbled speech and confusion. Stroke Risk Factors: diabetes mellitus, hyperlipidemia and hypertension Other Stroke Risk Factors: Advanced age, ETOH use, Obesity, Body  mass index is 34.13 kg/m. Hx stroke, CHF, CAD, CABG, Hx Post-op AFIB  Outstanding Stroke Work-up Studies:    Work up completed                                                      03/22/2017 ASSESSMENT:   He presented with the episode of transient altered mental status and speech difficulties possibly left hemispheric TIA versus complex partial seizure. Brain imaging is negative for stroke but CT angiogram of the neck shows 75% left carotid stenosis. Recommend check carotid ultrasound and if moderate to severe left carotid stenosis is confirmed may need elective left carotid surgery. Continue Depakote for seizure prophylaxis. Continue ongoing stroke workup. No family available at the bedside for discussion. Continue strict blood pressure control and close neurological monitoring as per post TPA protocol.   03/23/2017 ASSESSMENT Neuro exam non-focal. MRI negative for stroke. Likely seizure not therapeutic on Depakote. Carotid Duplex negative for B/L ICA stenosis. Long discussion with patient and family regarding stroke work up findings and POC for stroke and seizure treatment  PLAN  03/22/2017: Continue ASA and  Statin medications HH - PT/OT/SLP Ongoing aggressive stroke risk factor management Patient counseled to be compliant withhisantithrombotic medications Patient counseled on Lifestyle modifications including, Diet, Exercise, and Stress Follow up with [facility] in 6 weeks  HX OF STROKES: right MCA territory on the motor strip with no residual deficits  Bilateral ICA stenosis- Ruled out 75% on the left and 50% on the right on CTA imaging Carotid Duplex 1-39% stenosis PCP to routinely monitor  SEIZURES: EEG Negative for seizure activity No seizure activity reported overnight Continue Depakote Ativan PRN for seizure activity and notify Neurology immediately Maintain Seizure precautions  MEDICAL ISSUES: Possible Aspiration PNA - Ruled out -CXR Negative  CKD Stage 3 (GFR 30-59) -Gentle hydration -avoid nephrotoxic agents -Needs OP f/u for abd wall hernia  RESP No active issues, No reported SOB or need for oxygen On ipratropium athome. Continue home dose. Monitor clinically  HYPERTENSION: Stable SBP goal of <180. DBP goal of <105.  Nicardipine drip, Labetolol PRN Long term BP goal normotensive. Restart home B/P medications - Metoprolol 03/22/2017, Cozaar restarted 03/23/2017 Home Meds: Metoprolol, Cozaar  HYPERLIPIDEMIA: Labs(Brief)          Component Value Date/Time   CHOL 143 03/22/2017 0402   TRIG 142 03/22/2017 0402   HDL 36 (L) 03/22/2017 0402   CHOLHDL 4.0 03/22/2017 0402   VLDL 28 03/22/2017 0402   LDLCALC 79 03/22/2017 0402    Home Meds:  Niacin, Zetia and Lipitor 40 mg LDL  goal < 70 Continued on Lipitor 40 mg daily, Zetia and Niacin Continue statin medications at discharge  DIABETES: RecentLabs  Lab Results  Component Value Date   HGBA1C 6.5 (H) 03/22/2017    HgbA1c goal < 7.0 Currently on: Novolog Continue CBG monitoring and SSI to maintain glucose 140-180 mg/dl DM education   Hx of ETOH ABUSE CIWA  protocol if necessary  OBESITY Obesity, Body mass index is 34.13 kg/m. Greater than/equal to 30  DISCHARGE EXAM Blood pressure (!) 125/58, pulse 60, temperature 98.7 F (37.1 C), temperature source Oral, resp. rate 18, height 5\' 3"  (1.6 m), weight 87.4 kg (192 lb 10.9 oz), SpO2 98 %. General - Well nourished, well developed, in no apparent distress HEENT-  Normocephalic, Normal external eye/conjunctiva.  Normal external ears. Normal external nose, mucus membranes and septum.   Cardiovascular - Regular rate and rhythm  Respiratory - Lungs clear bilaterally. No wheezing. Abdomen - soft and non-tender, BS normal Extremities- no edema or cyanosis NEURO:  Mental Status: Alert, awake, oriented to self, place and month.   Language: speech is mildly dysarthric.  Naming is impaired, but repetition, fluency are intact and comprehension intact to simple commands. Cranial Nerves: PERRL. EOMI, visual fields-possible right homonymous hemianopsia/but quadrantanopsia the lower quadrant, no facial asymmetry, facial sensation intact, hearing reduced bilaterally, tongue/uvula/soft palate midline, normal sternocleidomastoid and trapezius muscle strength. No evidence of tongue atrophy or fibrillations Motor: 5/5 all over with no drift Tone: is normal and bulk is normal Sensation-decreased in glove and stocking pattern bilaterally.  There is some extinction to double simultaneous stimulation on the left but the exam was inconsistent. Coordination: FTN intact bilaterally Gait- deferred  Discharge Diet   Diet Carb Modified Fluid consistency: Thin; Room service appropriate? Yes liquids  DISCHARGE PLAN  Disposition:  HOME  aspirin 325 mg daily for secondary stroke prevention.  Ongoing risk factor control by Primary Care Physician at time of discharge  Follow-up Fred Marry, MD in 2 weeks. Follow-up with Dr. Rosalin Hawking, Stroke Clinic in 6 weeks, office to schedule an appointment. Family does not  want follow-up with Dr. Leonie Man Greater than 35 minutes were spent preparing discharge.  Mary Sella, ANP-C Stroke Neurology Team 03/22/2017 I have personally examined this patient, reviewed notes, independently viewed imaging studies, participated in medical decision making and plan of care.ROS completed by me personally and pertinent positives fully documented  I have made any additions or clarifications directly to the above note. Agree with note above.   Antony Contras, MD Medical Director [place] Pager: [phone] 03/23/2017 5:05 PM

## 2017-03-23 NOTE — Care Management Note (Signed)
Case Management Note  Patient Details  Name: Fred [name] MRN: [id number] Date of Birth: [birth year]  Subjective/Objective:       Pt admitted to r/o CVA. He is s/p TPA. He is from home with his spouse.             Action/Plan: PT/OT recommending [facility] services. CM following for d/c needs, physician orders.   Expected Discharge Date:                  Expected Discharge Plan:  [facility]  In-House Referral:     Discharge planning Services  CM Consult  Post Acute Care Choice:    Choice offered to:     DME Arranged:    DME Agency:     HH Arranged:    [place] Agency:     Status of Service:  In process, will continue to follow  If discussed at Long Length of Stay Meetings, dates discussed:    Additional Comments:  Pollie Friar, RN 03/23/2017, 11:10 AM

## 2017-03-23 NOTE — Progress Notes (Signed)
VASCULAR LAB PRELIMINARY  PRELIMINARY  PRELIMINARY  PRELIMINARY  Carotid duplex completed.    Preliminary report: Bilateral:1-39% ICA stenosis, highest end of scale.  Vertebral artery flow is antegrade.  Significant plaquing at bifurcation but diastolic velocities are not increased.   Marshall Kampf, RVT 03/23/2017, 9:09 AM

## 2017-03-23 NOTE — Consult Note (Signed)
            [facility] CM Primary Care Navigator  03/23/2017  BADR PIEDRA 30-Nov-1935 [id number]   Went to seepatient at the bedsideto identify possible discharge needs buthe wasjust dischargedper RNreport.   Per MD note, patient was admittedfor evaluation of an episode of garbled speech and confusion that the family had noted. His prior stroke was in 2016 with similar presentation and a seizure, but further workup revealed that he had a stroke  Patient was discharged hometoday with home health services per therapy recommendation.  Primary care provider's office called Merleen Nicely) to notify of patient's discharge and need for post hospital follow-up and transition of care. Notified of health issues needing follow-up as well. Made aware to refer patient to [facility] care management if deemed necessary or appropriate for services.  Patient has discharge instruction to follow-up withprimary care provider within 2 weeks and follow-up with stroke clinic in 6 weeks.   For additional questions please contact:  Edwena Felty A. Tesa Meadors, BSN, RN-BC [facility] PRIMARY CARE Navigator Cell: [phone]

## 2017-03-23 NOTE — Progress Notes (Signed)
Physical Therapy Treatment Patient Details Name: Fred [name] MRN: [id number] DOB: January 12, 1936 Today's Date: 03/23/2017    History of Present Illness 82 y.o. male who has a documented history of postoperative atrial fibrillation but is on no anticoagulation, coronary artery disease status post CABG, congestive heart failure, hyperlipidemia, hypertension, seizure in the setting of a previous stroke with no residual deficits, diabetic polyneuropathy, syncope, who presented to the emergency room for evaluation of an episode of garbled speech and confusion    PT Comments    Pt progressing well; anxious to go home; incr gait distance/activity tolerance, mild instability with dynamic gait tasks however no overt LOB gait  stability improving with incr distance  Follow Up Recommendations  Home health PT;Supervision for mobility/OOB     Equipment Recommendations  None recommended by PT    Recommendations for Other Services       Precautions / Restrictions Precautions Precautions: Fall Restrictions Weight Bearing Restrictions: No    Mobility  Bed Mobility   Bed Mobility: Supine to Sit     Supine to sit: Modified independent (Device/Increase time)     General bed mobility comments: slightly incr time, no physical assist  Transfers Overall transfer level: Needs assistance Equipment used: None Transfers: Sit to/from Stand Sit to Stand: Min guard         General transfer comment: min guard to close supervision for safety, unsteady upon standing however no overt LOB and only incr time required  Ambulation/Gait Ambulation/Gait assistance: Min guard;Supervision Ambulation Distance (Feet): 400 Feet Assistive device: None Gait Pattern/deviations: Step-through pattern;Decreased stride length;Drifts right/left     General Gait Details: patient with some mild instability during ambulation,  improved with distance   Stairs            Wheelchair Mobility    Modified  [place] (Stroke Patients Only)       Balance             Standing balance-Leahy Scale: Good               High level balance activites: Backward walking;Direction changes;Turns;Sudden stops;Head turns High Level Balance Comments: min guard for safety, however no LOB, reactions slightly delayed            Cognition Arousal/Alertness: Awake/alert Behavior During Therapy: WFL for tasks assessed/performed Overall Cognitive Status: Within Functional Limits for tasks assessed                                        Exercises      General Comments        Pertinent Vitals/Pain Pain Assessment: No/denies pain    Home Living                      Prior Function            PT Goals (current goals can now be found in the care plan section) Acute Rehab PT Goals Patient Stated Goal: to go home PT Goal Formulation: With patient/family Time For Goal Achievement: 04/05/17 Potential to Achieve Goals: Good Progress towards PT goals: Progressing toward goals    Frequency    Min 3X/week      PT Plan Current plan remains appropriate    Co-evaluation              AM-PAC PT "6 Clicks" Daily Activity  Outcome Measure  Difficulty turning over in  bed (including adjusting bedclothes, sheets and blankets)?: A Little Difficulty moving from lying on back to sitting on the side of the bed? : A Little Difficulty sitting down on and standing up from a chair with arms (e.g., wheelchair, bedside commode, etc,.)?: A Little Help needed moving to and from a bed to chair (including a wheelchair)?: A Little Help needed walking in hospital room?: A Little Help needed climbing 3-5 steps with a railing? : A Little 6 Click Score: 18    End of Session Equipment Utilized During Treatment: Gait belt Activity Tolerance: Patient tolerated treatment well Patient left: in chair;with call bell/phone within reach;with family/visitor present   PT Visit  Diagnosis: Unsteadiness on feet (R26.81)     Time: 7903-8333 PT Time Calculation (min) (ACUTE ONLY): 12 min  Charges:  $Gait Training: 8-22 mins                    G CodesKenyon Ana, PT Pager: [phone] 03/23/2017    [name] 03/23/2017, 9:28 AM

## 2017-03-23 NOTE — Progress Notes (Signed)
Discharge orders received.  Discharge instructions and follow-up appointments reviewed with the patient.  VSS upon discharge.  IV removed and education complete.  Transported out via wheelchair.   Tifanie Gardiner M, RN 

## 2017-03-23 NOTE — Care Management Note (Signed)
Case Management Note  Patient Details  Name: Fred [name] MRN: [id number] Date of Birth: August 23, 1935  Subjective/Objective:                    Action/Plan: Pt discharging home with [place] orders. CM met with the patient and provided him choice of [facility] agencies. He selected [place]. Butch Penny with [facility] notified and accepted the referral.  Family to provide transportation home.   Expected Discharge Date:  03/23/17               Expected Discharge Plan:  [place]  In-House Referral:     Discharge planning Services  CM Consult  Post Acute Care Choice:  Home Health Choice offered to:  Patient  DME Arranged:    DME Agency:     HH Arranged:  RN, PT, OT, Speech Therapy [place] Agency:  [place]  Status of Service:  Completed, signed off  If discussed at [place] of Stay Meetings, dates discussed:    Additional Comments:  Pollie Friar, RN 03/23/2017, 3:24 PM

## 2017-03-24 ENCOUNTER — Telehealth: Payer: Self-pay | Admitting: Family Medicine

## 2017-03-24 DIAGNOSIS — E669 Obesity, unspecified: Secondary | ICD-10-CM | POA: Diagnosis not present

## 2017-03-24 DIAGNOSIS — E1122 Type 2 diabetes mellitus with diabetic chronic kidney disease: Secondary | ICD-10-CM | POA: Diagnosis not present

## 2017-03-24 DIAGNOSIS — I480 Paroxysmal atrial fibrillation: Secondary | ICD-10-CM | POA: Diagnosis not present

## 2017-03-24 DIAGNOSIS — R32 Unspecified urinary incontinence: Secondary | ICD-10-CM | POA: Diagnosis not present

## 2017-03-24 DIAGNOSIS — E1151 Type 2 diabetes mellitus with diabetic peripheral angiopathy without gangrene: Secondary | ICD-10-CM | POA: Diagnosis not present

## 2017-03-24 DIAGNOSIS — M109 Gout, unspecified: Secondary | ICD-10-CM | POA: Diagnosis not present

## 2017-03-24 DIAGNOSIS — Z951 Presence of aortocoronary bypass graft: Secondary | ICD-10-CM | POA: Diagnosis not present

## 2017-03-24 DIAGNOSIS — K5731 Diverticulosis of large intestine without perforation or abscess with bleeding: Secondary | ICD-10-CM | POA: Diagnosis not present

## 2017-03-24 DIAGNOSIS — N183 Chronic kidney disease, stage 3 (moderate): Secondary | ICD-10-CM | POA: Diagnosis not present

## 2017-03-24 DIAGNOSIS — I2510 Atherosclerotic heart disease of native coronary artery without angina pectoris: Secondary | ICD-10-CM | POA: Diagnosis not present

## 2017-03-24 DIAGNOSIS — Z7982 Long term (current) use of aspirin: Secondary | ICD-10-CM | POA: Diagnosis not present

## 2017-03-24 DIAGNOSIS — E785 Hyperlipidemia, unspecified: Secondary | ICD-10-CM | POA: Diagnosis not present

## 2017-03-24 DIAGNOSIS — Z8673 Personal history of transient ischemic attack (TIA), and cerebral infarction without residual deficits: Secondary | ICD-10-CM | POA: Diagnosis not present

## 2017-03-24 DIAGNOSIS — E1142 Type 2 diabetes mellitus with diabetic polyneuropathy: Secondary | ICD-10-CM | POA: Diagnosis not present

## 2017-03-24 DIAGNOSIS — I130 Hypertensive heart and chronic kidney disease with heart failure and stage 1 through stage 4 chronic kidney disease, or unspecified chronic kidney disease: Secondary | ICD-10-CM | POA: Diagnosis not present

## 2017-03-24 DIAGNOSIS — G40209 Localization-related (focal) (partial) symptomatic epilepsy and epileptic syndromes with complex partial seizures, not intractable, without status epilepticus: Secondary | ICD-10-CM | POA: Diagnosis not present

## 2017-03-24 DIAGNOSIS — M1990 Unspecified osteoarthritis, unspecified site: Secondary | ICD-10-CM | POA: Diagnosis not present

## 2017-03-24 DIAGNOSIS — Z9181 History of falling: Secondary | ICD-10-CM | POA: Diagnosis not present

## 2017-03-24 DIAGNOSIS — Z7984 Long term (current) use of oral hypoglycemic drugs: Secondary | ICD-10-CM | POA: Diagnosis not present

## 2017-03-24 DIAGNOSIS — I509 Heart failure, unspecified: Secondary | ICD-10-CM | POA: Diagnosis not present

## 2017-03-24 DIAGNOSIS — D649 Anemia, unspecified: Secondary | ICD-10-CM | POA: Diagnosis not present

## 2017-03-24 NOTE — Telephone Encounter (Signed)
Thank you :)

## 2017-03-24 NOTE — Telephone Encounter (Signed)
Received call from [facility] with [facility], requesting the following verbal order for home nursing:   1x week, for 1 week 2x week for 2 weeks, 1x week for 6 weeks.  Verbal given.

## 2017-03-27 DIAGNOSIS — I509 Heart failure, unspecified: Secondary | ICD-10-CM | POA: Diagnosis not present

## 2017-03-27 DIAGNOSIS — E1142 Type 2 diabetes mellitus with diabetic polyneuropathy: Secondary | ICD-10-CM | POA: Diagnosis not present

## 2017-03-27 DIAGNOSIS — E1122 Type 2 diabetes mellitus with diabetic chronic kidney disease: Secondary | ICD-10-CM | POA: Diagnosis not present

## 2017-03-27 DIAGNOSIS — G40209 Localization-related (focal) (partial) symptomatic epilepsy and epileptic syndromes with complex partial seizures, not intractable, without status epilepticus: Secondary | ICD-10-CM | POA: Diagnosis not present

## 2017-03-27 DIAGNOSIS — I130 Hypertensive heart and chronic kidney disease with heart failure and stage 1 through stage 4 chronic kidney disease, or unspecified chronic kidney disease: Secondary | ICD-10-CM | POA: Diagnosis not present

## 2017-03-27 DIAGNOSIS — I480 Paroxysmal atrial fibrillation: Secondary | ICD-10-CM | POA: Diagnosis not present

## 2017-03-28 ENCOUNTER — Encounter: Payer: Self-pay | Admitting: Family Medicine

## 2017-03-28 ENCOUNTER — Ambulatory Visit (INDEPENDENT_AMBULATORY_CARE_PROVIDER_SITE_OTHER): Payer: Medicare Other | Admitting: Family Medicine

## 2017-03-28 VITALS — BP 127/54 | HR 67 | Ht 63.0 in | Wt 188.0 lb

## 2017-03-28 DIAGNOSIS — E1142 Type 2 diabetes mellitus with diabetic polyneuropathy: Secondary | ICD-10-CM | POA: Diagnosis not present

## 2017-03-28 DIAGNOSIS — Z8673 Personal history of transient ischemic attack (TIA), and cerebral infarction without residual deficits: Secondary | ICD-10-CM | POA: Diagnosis not present

## 2017-03-28 DIAGNOSIS — G40209 Localization-related (focal) (partial) symptomatic epilepsy and epileptic syndromes with complex partial seizures, not intractable, without status epilepticus: Secondary | ICD-10-CM | POA: Diagnosis not present

## 2017-03-28 DIAGNOSIS — E118 Type 2 diabetes mellitus with unspecified complications: Secondary | ICD-10-CM

## 2017-03-28 DIAGNOSIS — I10 Essential (primary) hypertension: Secondary | ICD-10-CM | POA: Diagnosis not present

## 2017-03-28 DIAGNOSIS — E1122 Type 2 diabetes mellitus with diabetic chronic kidney disease: Secondary | ICD-10-CM | POA: Diagnosis not present

## 2017-03-28 DIAGNOSIS — I639 Cerebral infarction, unspecified: Secondary | ICD-10-CM

## 2017-03-28 DIAGNOSIS — I130 Hypertensive heart and chronic kidney disease with heart failure and stage 1 through stage 4 chronic kidney disease, or unspecified chronic kidney disease: Secondary | ICD-10-CM | POA: Diagnosis not present

## 2017-03-28 DIAGNOSIS — I480 Paroxysmal atrial fibrillation: Secondary | ICD-10-CM | POA: Diagnosis not present

## 2017-03-28 DIAGNOSIS — Z794 Long term (current) use of insulin: Secondary | ICD-10-CM

## 2017-03-28 DIAGNOSIS — I509 Heart failure, unspecified: Secondary | ICD-10-CM | POA: Diagnosis not present

## 2017-03-28 NOTE — Progress Notes (Signed)
Subjective:    Patient ID: Fred [name], male    DOB: Mar 07, 1936, 82 y.o.   MRN: [id number]  HPI 82 year old male is here today to follow-up for recent hospitalization for a stroke.    Was admitted to [facility] on January 8 for stroke symptoms.  He was having significant impairment in his speech.  He did not have any significant findings on MRI but he was given IV TPA and per his daughter had a significant and quick recovery in symptoms.  He was followed by neurology while there.  Diabetes was well controlled with hemoglobin A1c of 6.5.  He was noted to have some carotid artery stenosis on the CT angios.  With 75% on the left and 50% on the right.  He does have a history of atrial fibrillation after surgery.  As well as coronary artery disease status post CABG.  He also had an EEG study which was negative for seizure.  Echocardiogram showed EF of 55-60%.  No significant defects.  He has been doing well since being home.  No complications or problems.He he has started a full dose of daily aspirin 325 mg.  And they did go ahead and restart his Depakote for seizure prophylaxis.   Here with his daughter [name].  She feels that his speech is almost back to baseline.  She says sometimes he will struggle with remembering a word but in regards to garbled speech that has completely resolved.  Hypertension- Pt denies chest pain, SOB, dizziness, or heart palpitations.  Taking meds as directed w/o problems.  Denies medication side effects.      Review of Systems  BP (!) 127/54   Pulse 67   Ht 5\' 3"  (1.6 m)   Wt 188 lb (85.3 kg)   SpO2 97%   BMI 33.30 kg/m     Allergies  Allergen Reactions  . Lisinopril Cough    Past Medical History:  Diagnosis Date  . Atrial fibrillation ([place])    Post operative  . CAD (coronary artery disease)    s/p cabg  . CHF (congestive heart failure) ([place])   . Diverticulosis   . DJD (degenerative joint disease)   . DM (diabetes mellitus) ([place])   .  History of anemia   . History of stroke   . Hyperlipidemia   . Hypertension   . 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 Peripheral vascular disease ([place])   . Polyneuropathy, diabetic ([place])   . Seizure disorder, complex partial ([place])   . Syncope and collapse     Past Surgical History:  Procedure Laterality Date  . CATARACT EXTRACTION W/ INTRAOCULAR LENS  IMPLANT, BILATERAL  06/2013   Dr. [name]  . CORONARY ARTERY BYPASS GRAFT  02/13/2004   4 vesel  . removal of sebaceous cyst from neck  08/2007   Dr. [name]  . TONSILLECTOMY      Social History   Socioeconomic History  . Marital status: Married    Spouse name: Shaya Altamura  . Number of children: 3  . Years of education: Not on file  . Highest education level: Not on file  Social Needs  . Financial resource strain: Not on file  . Food insecurity - worry: Not on file  . Food insecurity - inability: Not on file  . Transportation needs - medical: Not on file  . Transportation needs - non-medical: Not on file  Occupational History  . 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 Smoking status: Former Smoker    Packs/day: 1.00    Years: 22.00    Pack years: 22.00    Types: Cigarettes    Last attempt to quit: 03/14/1977    Years since quitting: 40.0  . Smokeless tobacco: Never Used  Substance and Sexual Activity  . Alcohol use: No  . Drug use: No  . Sexual activity: No  Other Topics Concern  . Not on file  Social History Narrative   3 caffeinated drinks per day. Mostly coffee. He walks 15 minutes 3 days per week. Quit smoking in 1978.    Family History  Problem Relation Age of Onset  . Epilepsy Father   . Stroke Mother   . Lung cancer Unknown     Outpatient Encounter Medications as of 03/28/2017  Medication Sig  . allopurinol (ZYLOPRIM) 300 MG tablet Take 1 tablet (300 mg total) by mouth daily.  Marland Kitchen aspirin 325 MG tablet Take 325 mg by  mouth daily.  Marland Kitchen atorvastatin (LIPITOR) 40 MG tablet Take 1 tablet (40 mg total) by mouth daily.  . Coenzyme Q10 (COQ10) 30 MG CAPS Take 1 tablet by mouth daily.    . divalproex (DEPAKOTE ER) 500 MG 24 hr tablet Take 1 tablet (500 mg total) by mouth daily.  Marland Kitchen ezetimibe (ZETIA) 10 MG tablet Take 1 tablet (10 mg total) by mouth daily.  . furosemide (LASIX) 40 MG tablet TAKE 1 TABLET BY MOUTH EVERY DAY AS NEEDED FOR FLUID RETENTION (Patient taking differently: TAKE 1 TABLET (40mg ) BY MOUTH EVERY DAY AS NEEDED FOR FLUID RETENTION)  . glimepiride (AMARYL) 4 MG tablet TAKE 1 TABLET BY MOUTH EVERY DAY BEFORE BREAKFAST (Patient taking differently: Take 4 mg by mouth daily with breakfast. )  . Insulin Glargine (LANTUS SOLOSTAR) 100 UNIT/ML Solostar Pen Inject 10-20 Units into the skin daily at 10 pm.  . ipratropium (ATROVENT) 0.03 % nasal spray USE 2 SPRAYS IN EACH NOSTRIL EVERY 12 HOURS  . losartan (COZAAR) 25 MG tablet Take 1 tablet (25 mg total) by mouth daily.  . metFORMIN (GLUCOPHAGE) 1000 MG tablet Take 1 tablet (1,000 mg total) by mouth 2 (two) times daily with a meal.  . metoprolol succinate (TOPROL-XL) 50 MG 24 hr tablet TAKE 1 TABLET BY MOUTH EVERY DAY (Patient taking differently: TAKE 1 TABLET (50mg ) BY MOUTH EVERY DAY)  . niacin (NIASPAN) 500 MG CR tablet Take 1 tablet (500 mg total) by mouth at bedtime.  . sitaGLIPtin (JANUVIA) 100 MG tablet Take 1 tablet (100 mg total) by mouth daily.   No facility-administered encounter medications on file as of 03/28/2017.          Objective:   Physical Exam  Constitutional: He is oriented to person, place, and time. He appears well-developed and well-nourished.  HENT:  Head: Normocephalic and atraumatic.  Cardiovascular: Normal rate, regular rhythm and normal heart sounds.  Pulmonary/Chest: Effort normal and breath sounds normal.  Neurological: He is alert and oriented to person, place, and time. No cranial nerve deficit. He exhibits normal muscle  tone. Coordination normal.  Length is 5 out of 5 in the upper and lower extremities.  Gait is normal.  Skin: Skin is warm and dry.  Psychiatric: He has a normal mood and affect. His behavior is normal.       Assessment & Plan:  Recent stroke-patient is back to baseline and doing well.  He is supposed to follow-up with  neurology in about 6 weeks.  Will place new referral.  Family felt like there was poor communication with Dr. Leonie Man the neurologist that saw him in the hospital and would like to follow-up with somebody else.  Taking aspirin daily.  He is back on seizure prophylaxis currently.  Diabetes-well controlled.  Hemoglobin A1c in the hospital was 6.5.  Tenuous Januvia and metformin.  HTN - Well controlled. Continue current regimen. Follow up in  4 months.   Coronary artery disease status post CABG-currently on Lipitor 40 mg, aspirin 325 mg.

## 2017-03-28 NOTE — Addendum Note (Signed)
Addended by: Beatrice Lecher D on: 03/28/2017 05:29 PM   Modules accepted: Orders

## 2017-03-29 ENCOUNTER — Encounter: Payer: Self-pay | Admitting: Neurology

## 2017-03-30 ENCOUNTER — Other Ambulatory Visit: Payer: Self-pay | Admitting: Family Medicine

## 2017-03-30 DIAGNOSIS — I480 Paroxysmal atrial fibrillation: Secondary | ICD-10-CM | POA: Diagnosis not present

## 2017-03-30 DIAGNOSIS — E1122 Type 2 diabetes mellitus with diabetic chronic kidney disease: Secondary | ICD-10-CM | POA: Diagnosis not present

## 2017-03-30 DIAGNOSIS — E1142 Type 2 diabetes mellitus with diabetic polyneuropathy: Secondary | ICD-10-CM | POA: Diagnosis not present

## 2017-03-30 DIAGNOSIS — I509 Heart failure, unspecified: Secondary | ICD-10-CM | POA: Diagnosis not present

## 2017-03-30 DIAGNOSIS — G40209 Localization-related (focal) (partial) symptomatic epilepsy and epileptic syndromes with complex partial seizures, not intractable, without status epilepticus: Secondary | ICD-10-CM | POA: Diagnosis not present

## 2017-03-30 DIAGNOSIS — I130 Hypertensive heart and chronic kidney disease with heart failure and stage 1 through stage 4 chronic kidney disease, or unspecified chronic kidney disease: Secondary | ICD-10-CM | POA: Diagnosis not present

## 2017-04-03 ENCOUNTER — Telehealth: Payer: Self-pay

## 2017-04-03 DIAGNOSIS — E1142 Type 2 diabetes mellitus with diabetic polyneuropathy: Secondary | ICD-10-CM | POA: Diagnosis not present

## 2017-04-03 DIAGNOSIS — E1122 Type 2 diabetes mellitus with diabetic chronic kidney disease: Secondary | ICD-10-CM | POA: Diagnosis not present

## 2017-04-03 DIAGNOSIS — I130 Hypertensive heart and chronic kidney disease with heart failure and stage 1 through stage 4 chronic kidney disease, or unspecified chronic kidney disease: Secondary | ICD-10-CM | POA: Diagnosis not present

## 2017-04-03 DIAGNOSIS — I509 Heart failure, unspecified: Secondary | ICD-10-CM | POA: Diagnosis not present

## 2017-04-03 DIAGNOSIS — G40209 Localization-related (focal) (partial) symptomatic epilepsy and epileptic syndromes with complex partial seizures, not intractable, without status epilepticus: Secondary | ICD-10-CM | POA: Diagnosis not present

## 2017-04-03 DIAGNOSIS — I480 Paroxysmal atrial fibrillation: Secondary | ICD-10-CM | POA: Diagnosis not present

## 2017-04-03 NOTE — Telephone Encounter (Signed)
Patient's daughter called in advising patient's insurance is requesting a prior authorization on his Januvia.  Called [facility] - spoke to [place]  - she advised prior Josem Kaufmann is being requested due to medication is being filled before 05/08/17, which is the refillable date. Asked Roanna Epley to fax prior auth to our office to be completed. She stated she would.

## 2017-04-05 ENCOUNTER — Telehealth: Payer: Self-pay | Admitting: Family Medicine

## 2017-04-05 NOTE — Telephone Encounter (Signed)
Received Pa on Januvia sent through cover my meds waiting on determination. - CF

## 2017-04-07 DIAGNOSIS — I509 Heart failure, unspecified: Secondary | ICD-10-CM | POA: Diagnosis not present

## 2017-04-07 DIAGNOSIS — E1122 Type 2 diabetes mellitus with diabetic chronic kidney disease: Secondary | ICD-10-CM | POA: Diagnosis not present

## 2017-04-07 DIAGNOSIS — I130 Hypertensive heart and chronic kidney disease with heart failure and stage 1 through stage 4 chronic kidney disease, or unspecified chronic kidney disease: Secondary | ICD-10-CM | POA: Diagnosis not present

## 2017-04-07 DIAGNOSIS — I480 Paroxysmal atrial fibrillation: Secondary | ICD-10-CM | POA: Diagnosis not present

## 2017-04-07 DIAGNOSIS — E1142 Type 2 diabetes mellitus with diabetic polyneuropathy: Secondary | ICD-10-CM | POA: Diagnosis not present

## 2017-04-07 DIAGNOSIS — G40209 Localization-related (focal) (partial) symptomatic epilepsy and epileptic syndromes with complex partial seizures, not intractable, without status epilepticus: Secondary | ICD-10-CM | POA: Diagnosis not present

## 2017-04-07 MED ORDER — LINAGLIPTIN 5 MG PO TABS: 5.0000 mg | ORAL_TABLET | Freq: Every day | ORAL | 5 refills | Status: DC

## 2017-04-07 NOTE — Telephone Encounter (Signed)
Received fax from [facility] and they denied coverage on Januvia due to patient has to have tried and failed Tradjenta, Nesina and Onglyza. - CF  File ID [id number]

## 2017-04-07 NOTE — Addendum Note (Signed)
Addended by: Beatrice Lecher D on: 04/07/2017 06:44 PM   Modules accepted: Orders

## 2017-04-07 NOTE — Telephone Encounter (Signed)
OK new rx sent for tradjenta.

## 2017-04-07 NOTE — Telephone Encounter (Signed)
Dr. Darene Lamer   Please Disregard I meant to send to Dr. Madilyn Fireman.   Jenny Reichmann

## 2017-04-11 DIAGNOSIS — G40209 Localization-related (focal) (partial) symptomatic epilepsy and epileptic syndromes with complex partial seizures, not intractable, without status epilepticus: Secondary | ICD-10-CM | POA: Diagnosis not present

## 2017-04-11 DIAGNOSIS — E1122 Type 2 diabetes mellitus with diabetic chronic kidney disease: Secondary | ICD-10-CM | POA: Diagnosis not present

## 2017-04-11 DIAGNOSIS — E1142 Type 2 diabetes mellitus with diabetic polyneuropathy: Secondary | ICD-10-CM | POA: Diagnosis not present

## 2017-04-11 DIAGNOSIS — I509 Heart failure, unspecified: Secondary | ICD-10-CM | POA: Diagnosis not present

## 2017-04-11 DIAGNOSIS — I130 Hypertensive heart and chronic kidney disease with heart failure and stage 1 through stage 4 chronic kidney disease, or unspecified chronic kidney disease: Secondary | ICD-10-CM | POA: Diagnosis not present

## 2017-04-11 DIAGNOSIS — I480 Paroxysmal atrial fibrillation: Secondary | ICD-10-CM | POA: Diagnosis not present

## 2017-04-19 DIAGNOSIS — I130 Hypertensive heart and chronic kidney disease with heart failure and stage 1 through stage 4 chronic kidney disease, or unspecified chronic kidney disease: Secondary | ICD-10-CM | POA: Diagnosis not present

## 2017-04-19 DIAGNOSIS — E1142 Type 2 diabetes mellitus with diabetic polyneuropathy: Secondary | ICD-10-CM | POA: Diagnosis not present

## 2017-04-19 DIAGNOSIS — E1122 Type 2 diabetes mellitus with diabetic chronic kidney disease: Secondary | ICD-10-CM | POA: Diagnosis not present

## 2017-04-19 DIAGNOSIS — G40209 Localization-related (focal) (partial) symptomatic epilepsy and epileptic syndromes with complex partial seizures, not intractable, without status epilepticus: Secondary | ICD-10-CM | POA: Diagnosis not present

## 2017-04-19 DIAGNOSIS — I509 Heart failure, unspecified: Secondary | ICD-10-CM | POA: Diagnosis not present

## 2017-04-19 DIAGNOSIS — I480 Paroxysmal atrial fibrillation: Secondary | ICD-10-CM | POA: Diagnosis not present

## 2017-04-24 DIAGNOSIS — E1142 Type 2 diabetes mellitus with diabetic polyneuropathy: Secondary | ICD-10-CM | POA: Diagnosis not present

## 2017-04-24 DIAGNOSIS — I509 Heart failure, unspecified: Secondary | ICD-10-CM | POA: Diagnosis not present

## 2017-04-24 DIAGNOSIS — I480 Paroxysmal atrial fibrillation: Secondary | ICD-10-CM | POA: Diagnosis not present

## 2017-04-24 DIAGNOSIS — I130 Hypertensive heart and chronic kidney disease with heart failure and stage 1 through stage 4 chronic kidney disease, or unspecified chronic kidney disease: Secondary | ICD-10-CM | POA: Diagnosis not present

## 2017-04-24 DIAGNOSIS — E1122 Type 2 diabetes mellitus with diabetic chronic kidney disease: Secondary | ICD-10-CM | POA: Diagnosis not present

## 2017-04-24 DIAGNOSIS — G40209 Localization-related (focal) (partial) symptomatic epilepsy and epileptic syndromes with complex partial seizures, not intractable, without status epilepticus: Secondary | ICD-10-CM | POA: Diagnosis not present

## 2017-04-25 ENCOUNTER — Ambulatory Visit: Payer: Medicare Other | Admitting: Family Medicine

## 2017-05-01 DIAGNOSIS — G40209 Localization-related (focal) (partial) symptomatic epilepsy and epileptic syndromes with complex partial seizures, not intractable, without status epilepticus: Secondary | ICD-10-CM | POA: Diagnosis not present

## 2017-05-01 DIAGNOSIS — I480 Paroxysmal atrial fibrillation: Secondary | ICD-10-CM | POA: Diagnosis not present

## 2017-05-01 DIAGNOSIS — I130 Hypertensive heart and chronic kidney disease with heart failure and stage 1 through stage 4 chronic kidney disease, or unspecified chronic kidney disease: Secondary | ICD-10-CM | POA: Diagnosis not present

## 2017-05-01 DIAGNOSIS — E1122 Type 2 diabetes mellitus with diabetic chronic kidney disease: Secondary | ICD-10-CM | POA: Diagnosis not present

## 2017-05-01 DIAGNOSIS — E1142 Type 2 diabetes mellitus with diabetic polyneuropathy: Secondary | ICD-10-CM | POA: Diagnosis not present

## 2017-05-01 DIAGNOSIS — I509 Heart failure, unspecified: Secondary | ICD-10-CM | POA: Diagnosis not present

## 2017-05-10 DIAGNOSIS — I509 Heart failure, unspecified: Secondary | ICD-10-CM | POA: Diagnosis not present

## 2017-05-10 DIAGNOSIS — I130 Hypertensive heart and chronic kidney disease with heart failure and stage 1 through stage 4 chronic kidney disease, or unspecified chronic kidney disease: Secondary | ICD-10-CM | POA: Diagnosis not present

## 2017-05-10 DIAGNOSIS — G40209 Localization-related (focal) (partial) symptomatic epilepsy and epileptic syndromes with complex partial seizures, not intractable, without status epilepticus: Secondary | ICD-10-CM | POA: Diagnosis not present

## 2017-05-10 DIAGNOSIS — E1142 Type 2 diabetes mellitus with diabetic polyneuropathy: Secondary | ICD-10-CM | POA: Diagnosis not present

## 2017-05-10 DIAGNOSIS — I480 Paroxysmal atrial fibrillation: Secondary | ICD-10-CM | POA: Diagnosis not present

## 2017-05-10 DIAGNOSIS — E1122 Type 2 diabetes mellitus with diabetic chronic kidney disease: Secondary | ICD-10-CM | POA: Diagnosis not present

## 2017-05-11 ENCOUNTER — Encounter: Payer: Self-pay | Admitting: Family Medicine

## 2017-05-11 ENCOUNTER — Ambulatory Visit (INDEPENDENT_AMBULATORY_CARE_PROVIDER_SITE_OTHER): Payer: Medicare Other

## 2017-05-11 ENCOUNTER — Ambulatory Visit (INDEPENDENT_AMBULATORY_CARE_PROVIDER_SITE_OTHER): Payer: Medicare Other | Admitting: Family Medicine

## 2017-05-11 VITALS — BP 138/64 | HR 61 | Ht 63.0 in | Wt 186.0 lb

## 2017-05-11 DIAGNOSIS — I639 Cerebral infarction, unspecified: Secondary | ICD-10-CM | POA: Diagnosis not present

## 2017-05-11 DIAGNOSIS — Z8673 Personal history of transient ischemic attack (TIA), and cerebral infarction without residual deficits: Secondary | ICD-10-CM

## 2017-05-11 DIAGNOSIS — Z794 Long term (current) use of insulin: Secondary | ICD-10-CM

## 2017-05-11 DIAGNOSIS — M170 Bilateral primary osteoarthritis of knee: Secondary | ICD-10-CM | POA: Insufficient documentation

## 2017-05-11 DIAGNOSIS — I10 Essential (primary) hypertension: Secondary | ICD-10-CM | POA: Diagnosis not present

## 2017-05-11 DIAGNOSIS — E118 Type 2 diabetes mellitus with unspecified complications: Secondary | ICD-10-CM | POA: Diagnosis not present

## 2017-05-11 DIAGNOSIS — I2510 Atherosclerotic heart disease of native coronary artery without angina pectoris: Secondary | ICD-10-CM

## 2017-05-11 MED ORDER — GLIMEPIRIDE 4 MG PO TABS: 4.0000 mg | ORAL_TABLET | Freq: Every day | ORAL | 3 refills | Status: DC

## 2017-05-11 NOTE — Assessment & Plan Note (Signed)
Bilateral injection. Return in 1 month to evaluate relief. Getting updated x-rays, home rehab exercises given.

## 2017-05-11 NOTE — Progress Notes (Signed)
Subjective:    Patient ID: Fred [name], male    DOB: [birth year], 82 y.o.   MRN: [id number]  HPI  Diabetes - no hypoglycemic events. No wounds or sores that are not healing well. No increased thirst or urination. Checking glucose at home.  We recently switched him to [place] because of insurance coverage.  Taking medications as prescribed without any side effects.  He is been tolerating the new medication well.  Home blood sugars have been running from 110-130.  He says in the last month he is only had 2 high blood sugars and they were right around 50.  1 of those was after he ate a little piece of cake at his grandchild's birthday party.  Hypertension- Pt denies chest pain, SOB, dizziness, or heart palpitations.  Taking meds as directed w/o problems.  Denies medication side effects.    Recent stroke- he has not follow back up with neurology but says he has an appointment March 27 with [facility].  He really feels like he is back to baseline and doing really well.  Still has a home health nurse coming in weekly to check on him.   Review of Systems  BP 138/64   Pulse 61   Ht 5\' 3"  (1.6 m)   Wt 186 lb (84.4 kg)   SpO2 98%   BMI 32.95 kg/m     Allergies  Allergen Reactions  . Lisinopril Cough    Past Medical History:  Diagnosis Date  . Atrial fibrillation ([place])    Post operative  . CAD (coronary artery disease)    s/p cabg  . CHF (congestive heart failure) ([place])   . Diverticulosis   . DJD (degenerative joint disease)   . DM (diabetes mellitus) ([place])   . History of anemia   . History of stroke   . Hyperlipidemia   . Hypertension   . 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 Peripheral vascular disease ([place])   . Polyneuropathy, diabetic ([place])   . Seizure disorder, complex partial ([place])   . Syncope and collapse     Past Surgical History:  Procedure Laterality Date  . CATARACT EXTRACTION W/ INTRAOCULAR LENS  IMPLANT, BILATERAL  06/2013   Dr.  [name]  . CORONARY ARTERY BYPASS GRAFT  02/13/2004   4 vesel  . removal of sebaceous cyst from neck  08/2007   Dr. [name]  . TONSILLECTOMY      Social History   Socioeconomic History  . Marital status: Married    Spouse name: Jerrell Mangel  . Number of children: 3  . Years of education: Not on file  . Highest education level: Not on file  Social Needs  . Financial resource strain: Not on file  . Food insecurity - worry: Not on file  . Food insecurity - inability: Not on file  . Transportation needs - medical: Not on file  . Transportation needs - non-medical: Not on file  Occupational History  . 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 Smoking status: Former Smoker    Packs/day: 1.00    Years: 22.00    Pack years: 22.00    Types: Cigarettes    Last attempt to quit: 03/14/1977    Years since quitting: 40.1  . Smokeless tobacco: Never Used  Substance and Sexual Activity  . Alcohol use: No  . Drug use: No  . Sexual activity: No  Other Topics Concern  . Not on file  Social History Narrative   3 caffeinated drinks per day. Mostly coffee. He walks 15 minutes 3 days per week. Quit smoking in 1978.    Family History  Problem Relation Age of Onset  . Epilepsy Father   . Stroke Mother   . Lung cancer Unknown     Outpatient Encounter Medications as of 05/11/2017  Medication Sig  . allopurinol (ZYLOPRIM) 300 MG tablet Take 1 tablet (300 mg total) by mouth daily.  Marland Kitchen aspirin 325 MG tablet Take 325 mg by mouth daily.  Marland Kitchen atorvastatin (LIPITOR) 40 MG tablet Take 1 tablet (40 mg total) by mouth daily.  . Coenzyme Q10 (COQ10) 30 MG CAPS Take 1 tablet by mouth daily.    . divalproex (DEPAKOTE ER) 500 MG 24 hr tablet Take 1 tablet (500 mg total) by mouth daily.  Marland Kitchen ezetimibe (ZETIA) 10 MG tablet Take 1 tablet (10 mg total) by mouth daily.  . furosemide (LASIX) 40 MG tablet TAKE 1 TABLET BY MOUTH EVERY DAY AS NEEDED  FOR FLUID RETENTION (Patient taking differently: TAKE 1 TABLET (40mg ) BY MOUTH EVERY DAY AS NEEDED FOR FLUID RETENTION)  . glimepiride (AMARYL) 4 MG tablet Take 1 tablet (4 mg total) by mouth daily with breakfast.  . Insulin Glargine (LANTUS SOLOSTAR) 100 UNIT/ML Solostar Pen Inject 10-20 Units into the skin daily at 10 pm.  . ipratropium (ATROVENT) 0.03 % nasal spray USE 2 SPRAYS IN EACH NOSTRIL EVERY 12 HOURS  . linagliptin (TRADJENTA) 5 MG TABS tablet Take 1 tablet (5 mg total) by mouth daily. insuranc would not cover januvia  . losartan (COZAAR) 25 MG tablet Take 1 tablet (25 mg total) by mouth daily.  . metFORMIN (GLUCOPHAGE) 1000 MG tablet Take 1 tablet (1,000 mg total) by mouth 2 (two) times daily with a meal.  . metoprolol succinate (TOPROL-XL) 50 MG 24 hr tablet TAKE 1 TABLET BY MOUTH EVERY DAY (Patient taking differently: TAKE 1 TABLET (50mg ) BY MOUTH EVERY DAY)  . niacin (NIASPAN) 500 MG CR tablet Take 1 tablet (500 mg total) by mouth at bedtime.  . [DISCONTINUED] glimepiride (AMARYL) 4 MG tablet TAKE 1 TABLET BY MOUTH EVERY DAY BEFORE BREAKFAST (Patient taking differently: Take 4 mg by mouth daily with breakfast. )   No facility-administered encounter medications on file as of 05/11/2017.          Objective:   Physical Exam  Constitutional: He is oriented to person, place, and time. He appears well-developed and well-nourished.  HENT:  Head: Normocephalic and atraumatic.  Eyes: Conjunctivae are normal.  Neck: Neck supple. No thyromegaly present.  Cardiovascular: Normal rate, regular rhythm and normal heart sounds.  Radial pulse 2+ bilat.   Pulmonary/Chest: Effort normal and breath sounds normal.  Musculoskeletal: He exhibits no edema.  Lymphadenopathy:    He has no cervical adenopathy.  Neurological: He is alert and oriented to person, place, and time.  Skin: Skin is warm and dry.  Psychiatric: He has a normal mood and affect. His behavior is normal.          Assessment & Plan:  DM -last A1c was at goal.  Doing well on new medication regimen.  Foot exam due today.  Plan to follow-up after April 9 for 90-day follow-up. Lab Results  Component Value Date   HGBA1C 6.5 (H) 03/22/2017   S/P stroke -is really back to baseline and doing well.  Continue aspirin and statin.  Has follow-up with neurology in  about 3 weeks.  HTN - Well controlled. Continue current regimen. Follow up in 6 months.    Coronary artery disease status post CABG-currently on Lipitor 40 mg, aspirin 325 mg.

## 2017-05-11 NOTE — Progress Notes (Signed)
Subjective:    I'm seeing this patient as a consultation for:  Dr. Beatrice Lecher  CC: Bilateral knee pain  HPI: This is a pleasant 82 year old male, decades long history of pain in the knees, mostly anterior, patellofemoral in nature, worse going up and down stairs, squatting.  Moderate, persistent without radiation, no mechanical symptoms, no trauma.  Oral analgesics have been ineffective so far.  I reviewed the past medical history, family history, social history, surgical history, and allergies today and no changes were needed.  Please see the problem list section below in epic for further details.  Past Medical History: Past Medical History:  Diagnosis Date  . Atrial fibrillation ([place])    Post operative  . CAD (coronary artery disease)    s/p cabg  . CHF (congestive heart failure) ([place])   . Diverticulosis   . DJD (degenerative joint disease)   . DM (diabetes mellitus) ([place])   . History of anemia   . History of stroke   . Hyperlipidemia   . Hypertension   . 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 Peripheral vascular disease ([place])   . Polyneuropathy, diabetic ([place])   . Seizure disorder, complex partial ([place])   . Syncope and collapse    Past Surgical History: Past Surgical History:  Procedure Laterality Date  . CATARACT EXTRACTION W/ INTRAOCULAR LENS  IMPLANT, BILATERAL  06/2013   Dr. [name]  . CORONARY ARTERY BYPASS GRAFT  02/13/2004   4 vesel  . removal of sebaceous cyst from neck  08/2007   Dr. [name]  . TONSILLECTOMY     Social History: Social History   Socioeconomic History  . Marital status: Married    Spouse name: Fred [name]  . Number of children: 3  . Years of education: None  . Highest education level: None  Social Needs  . Financial resource strain: None  . Food insecurity - worry: None  . Food insecurity - inability: None  . Transportation needs - medical: None  . Transportation needs - non-medical: None    Occupational History  . 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 Smoking status: Former Smoker    Packs/day: 1.00    Years: 22.00    Pack years: 22.00    Types: Cigarettes    Last attempt to quit: 03/14/1977    Years since quitting: 40.1  . Smokeless tobacco: Never Used  Substance and Sexual Activity  . Alcohol use: No  . Drug use: No  . Sexual activity: No  Other Topics Concern  . None  Social History Narrative   3 caffeinated drinks per day. Mostly coffee. He walks 15 minutes 3 days per week. Quit smoking in 1978.   Family History: Family History  Problem Relation Age of Onset  . Epilepsy Father   . Stroke Mother   . Lung cancer Unknown    Allergies: Allergies  Allergen Reactions  . Lisinopril Cough   Medications: See med rec.  Review of Systems: No headache, visual changes, nausea, vomiting, diarrhea, constipation, dizziness, abdominal pain, skin rash, fevers, chills, night sweats, weight loss, swollen lymph nodes, body aches, joint swelling, muscle aches, chest pain, shortness of breath, mood changes, visual or auditory hallucinations.   Objective:   General: Well Developed, well nourished, and in no acute distress.  Neuro:  Extra-ocular muscles intact, able to move all 4 extremities, sensation grossly intact.  Deep tendon reflexes  tested were normal. Psych: Alert and oriented, mood congruent with affect. ENT:  Ears and nose appear unremarkable.  Hearing grossly normal. Neck: Unremarkable overall appearance, trachea midline.  No visible thyroid enlargement. Eyes: Conjunctivae and lids appear unremarkable.  Pupils equal and round. Skin: Warm and dry, no rashes noted.  Cardiovascular: Pulses palpable, no extremity edema. Bilateral knees: Minimally effused right worse than left, tenderness over the patellar facets. ROM normal in flexion and extension and lower leg rotation. Ligaments with solid consistent  endpoints including ACL, PCL, LCL, MCL. Negative Mcmurray's and provocative meniscal tests. Non painful patellar compression. Patellar and quadriceps tendons unremarkable. Hamstring and quadriceps strength is normal.  Procedure: Real-time Ultrasound Guided Injection of right knee Device: GE Logiq E  Verbal informed consent obtained.  Time-out conducted.  Noted no overlying erythema, induration, or other signs of local infection.  Skin prepped in a sterile fashion.  Local anesthesia: Topical Ethyl chloride.  With sterile technique and under real time ultrasound guidance: 1 cc Kenalog 40, 2 cc lidocaine, 2 cc bupivacaine injected easily into the suprapatellar recess. Completed without difficulty  Pain immediately resolved suggesting accurate placement of the medication.  Advised to call if fevers/chills, erythema, induration, drainage, or persistent bleeding.  Images permanently stored and available for review in the ultrasound unit.  Impression: Technically successful ultrasound guided injection.  Procedure: Real-time Ultrasound Guided Injection of left knee Device: GE Logiq E  Verbal informed consent obtained.  Time-out conducted.  Noted no overlying erythema, induration, or other signs of local infection.  Skin prepped in a sterile fashion.  Local anesthesia: Topical Ethyl chloride.  With sterile technique and under real time ultrasound guidance: 1 cc Kenalog 40, 2 cc lidocaine, 2 cc bupivacaine injected easily into the suprapatellar recess. Completed without difficulty  Pain immediately resolved suggesting accurate placement of the medication.  Advised to call if fevers/chills, erythema, induration, drainage, or persistent bleeding.  Images permanently stored and available for review in the ultrasound unit.  Impression: Technically successful ultrasound guided injection.   Impression and Recommendations:   This case required medical decision making of moderate  complexity.  Primary osteoarthritis of both knees Bilateral injection. Return in 1 month to evaluate relief. Getting updated x-rays, home rehab exercises given.  ___________________________________________ Fred [name]. Dianah Field, M.D., ABFM., CAQSM. Primary Care and [place] In[employer] of [place] [facility]

## 2017-05-15 DIAGNOSIS — I480 Paroxysmal atrial fibrillation: Secondary | ICD-10-CM | POA: Diagnosis not present

## 2017-05-15 DIAGNOSIS — I130 Hypertensive heart and chronic kidney disease with heart failure and stage 1 through stage 4 chronic kidney disease, or unspecified chronic kidney disease: Secondary | ICD-10-CM | POA: Diagnosis not present

## 2017-05-15 DIAGNOSIS — E1142 Type 2 diabetes mellitus with diabetic polyneuropathy: Secondary | ICD-10-CM | POA: Diagnosis not present

## 2017-05-15 DIAGNOSIS — E1122 Type 2 diabetes mellitus with diabetic chronic kidney disease: Secondary | ICD-10-CM | POA: Diagnosis not present

## 2017-05-15 DIAGNOSIS — I509 Heart failure, unspecified: Secondary | ICD-10-CM | POA: Diagnosis not present

## 2017-05-15 DIAGNOSIS — G40209 Localization-related (focal) (partial) symptomatic epilepsy and epileptic syndromes with complex partial seizures, not intractable, without status epilepticus: Secondary | ICD-10-CM | POA: Diagnosis not present

## 2017-05-22 DIAGNOSIS — I480 Paroxysmal atrial fibrillation: Secondary | ICD-10-CM | POA: Diagnosis not present

## 2017-05-22 DIAGNOSIS — E1122 Type 2 diabetes mellitus with diabetic chronic kidney disease: Secondary | ICD-10-CM | POA: Diagnosis not present

## 2017-05-22 DIAGNOSIS — G40209 Localization-related (focal) (partial) symptomatic epilepsy and epileptic syndromes with complex partial seizures, not intractable, without status epilepticus: Secondary | ICD-10-CM | POA: Diagnosis not present

## 2017-05-22 DIAGNOSIS — I130 Hypertensive heart and chronic kidney disease with heart failure and stage 1 through stage 4 chronic kidney disease, or unspecified chronic kidney disease: Secondary | ICD-10-CM | POA: Diagnosis not present

## 2017-05-22 DIAGNOSIS — E1142 Type 2 diabetes mellitus with diabetic polyneuropathy: Secondary | ICD-10-CM | POA: Diagnosis not present

## 2017-05-22 DIAGNOSIS — I509 Heart failure, unspecified: Secondary | ICD-10-CM | POA: Diagnosis not present

## 2017-05-25 ENCOUNTER — Ambulatory Visit: Payer: Medicare Other | Admitting: Neurology

## 2017-05-31 ENCOUNTER — Ambulatory Visit: Payer: Medicare Other | Admitting: Neurology

## 2017-06-07 ENCOUNTER — Encounter: Payer: Self-pay | Admitting: Neurology

## 2017-06-07 ENCOUNTER — Other Ambulatory Visit: Payer: Self-pay

## 2017-06-07 ENCOUNTER — Ambulatory Visit (INDEPENDENT_AMBULATORY_CARE_PROVIDER_SITE_OTHER): Payer: Medicare Other | Admitting: Neurology

## 2017-06-07 VITALS — BP 144/68 | HR 66 | Ht 63.0 in | Wt 178.0 lb

## 2017-06-07 DIAGNOSIS — Z8673 Personal history of transient ischemic attack (TIA), and cerebral infarction without residual deficits: Secondary | ICD-10-CM

## 2017-06-07 DIAGNOSIS — G40009 Localization-related (focal) (partial) idiopathic epilepsy and epileptic syndromes with seizures of localized onset, not intractable, without status epilepticus: Secondary | ICD-10-CM

## 2017-06-07 DIAGNOSIS — I639 Cerebral infarction, unspecified: Secondary | ICD-10-CM

## 2017-06-07 MED ORDER — DIVALPROEX SODIUM ER 500 MG PO TB24: 500.0000 mg | ORAL_TABLET | Freq: Every day | ORAL | 11 refills | Status: DC

## 2017-06-07 NOTE — Progress Notes (Signed)
NEUROLOGY CONSULTATION NOTE  Fred [name] MRN: [id number] DOB: [birth year]  Referring provider: Dr. Beatrice Lecher Primary care provider: Dr. Beatrice Lecher  Reason for consult:  epilepsy  Dear Dr. Madilyn Fireman:  Thank you for your kind referral of Fred [name] for consultation of the above symptoms. Although his history is well known to you, please allow me to reiterate it for the purpose of our medical record. The patient was accompanied to the clinic by his daughter who also provides collateral information. Records and images were personally reviewed where available.  HISTORY OF PRESENT ILLNESS: This is an 82 year old right-handed man with a history of hypertension, diabetes, CAD s/p CABG, post-operative atrial fibrillation, PVD, prior stroke with subsequent seizures, presenting to establish care. He had previously been seeing neurologist Dr. Erlinda Hong, records reviewed. He had a stroke in June 2016 with left-sided weakness and confusion. He did not receive IV-TPA due to rapidly resolving deficits. His MRI brain showed an acute small infarct in the right posterior frontal lobe and remote small thalamic infarcts. STroke workup was unremarkable except for elevated HbA1c of 7.3 and EF 45-40%. Due to questionable seizure, he was discharged home on Depakote ER 500mg  daily. There is note of a confusional episode when he had a CABG done in 2015 with post-procedure atrial fibrillation, he had an EEG, results unavailable. On his follow-up with Dr. Erlinda Hong in August 2016, they asked about continuing Depakote for seizure and it was discontinued. He was off Depakote on follow-up in November 2017. On his last visit with Dr. Erlinda Hong in July 2018, it was noted that he was put back on Depakote by his PCP for seizure prevention in May 2018 (refills were sent and a referral to Neurology was done at that time). On 03/21/2017 he had an episode of garbled speech and confusion. They could not make out what he was saying,  it was all gibberish. He was getting frustrated because they could not understand him. He was holding a can of soda in his hand and clenched on it so hard the soda spilled. He started making words in 3-4 minutes. When EMS arrived, speech was still slurred, he was still confused and agreed to go to the hospital. He was brought to [facility] where he was noted to be aphasic with probable right-sided neglect and homonymous hemianopia. Family reports he said he was 82 years old and did not know the president. Head CT and CTA were negative, due to concern for stroke he was given IV-TPA within the 4-1/2 hour window. His daughter reports that within 15-20 minutes after TPA, they saw a dramatic change. His MRI brain did not show any infarct. It was noted he had a stroke-like episode treated with IV-TPA with full recovery, possible complex partial seizure. He had an EEG which was mildly abnormal due to mild background slowing. It is unclear when Depakote was stopped, but family reports it was restarted at that time (Depakote level on admission was <10). He was discharged home on Depakote ER 500mg  qhs and has overall been doing better.  He does not recall the hospitalization in January 2019 and denies having any difficulties. His daughter also reports an episode in 2017 where he came out of the car looking straight ahead, stammering, mumbling but not saying real words. Then he just came out of it and was fine. They brought him to the hospital (no records on Epic) and was told it was probably a TIA and seizure. His daughter denies any  other staring/unresponsive episodes. He denies any olfactory/gustatory hallucinations, deja vu, rising epigastric sensation, focal numbness/tingling/weakness, myoclonic jerks. He denies any headaches, dizziness, diplopia, dysarthria/dysphagia, neck/back pain, bowel/bladder dysfunction. Sleep is good. He has some knee pain and leg cramps.   Epilepsy Risk Factors:  His father had seizures. Prior  stroke. Otherwise he had a normal birth and early development.  There is no history of febrile convulsions, CNS infections such as meningitis/encephalitis, significant traumatic brain injury, neurosurgical procedures.  PAST MEDICAL HISTORY: Past Medical History:  Diagnosis Date  . Atrial fibrillation ([place])    Post operative  . CAD (coronary artery disease)    s/p cabg  . CHF (congestive heart failure) ([place])   . Diverticulosis   . DJD (degenerative joint disease)   . DM (diabetes mellitus) ([place])   . History of anemia   . History of stroke   . Hyperlipidemia   . Hypertension   . 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 Peripheral vascular disease ([place])   . Polyneuropathy, diabetic ([place])   . Seizure disorder, complex partial ([place])   . Syncope and collapse     PAST SURGICAL HISTORY: Past Surgical History:  Procedure Laterality Date  . CATARACT EXTRACTION W/ INTRAOCULAR LENS  IMPLANT, BILATERAL  06/2013   Dr. [name]  . CORONARY ARTERY BYPASS GRAFT  02/13/2004   4 vesel  . removal of sebaceous cyst from neck  08/2007   Dr. [name]  . TONSILLECTOMY      MEDICATIONS: Current Outpatient Medications on File Prior to Visit  Medication Sig Dispense Refill  . allopurinol (ZYLOPRIM) 300 MG tablet Take 1 tablet (300 mg total) by mouth daily. 90 tablet 4  . aspirin 325 MG tablet Take 325 mg by mouth daily.    Marland Kitchen atorvastatin (LIPITOR) 40 MG tablet Take 1 tablet (40 mg total) by mouth daily. 90 tablet 1  . Coenzyme Q10 (COQ10) 30 MG CAPS Take 1 tablet by mouth daily.      . divalproex (DEPAKOTE ER) 500 MG 24 hr tablet Take 1 tablet (500 mg total) by mouth daily. 30 tablet 1  . ezetimibe (ZETIA) 10 MG tablet Take 1 tablet (10 mg total) by mouth daily. 90 tablet 3  . furosemide (LASIX) 40 MG tablet TAKE 1 TABLET BY MOUTH EVERY DAY AS NEEDED FOR FLUID RETENTION (Patient taking differently: TAKE 1 TABLET (40mg ) BY MOUTH EVERY DAY AS NEEDED FOR FLUID RETENTION) 90 tablet 1    . glimepiride (AMARYL) 4 MG tablet Take 1 tablet (4 mg total) by mouth daily with breakfast. 90 tablet 3  . Insulin Glargine (LANTUS SOLOSTAR) 100 UNIT/ML Solostar Pen Inject 10-20 Units into the skin daily at 10 pm. 15 mL 11  . ipratropium (ATROVENT) 0.03 % nasal spray USE 2 SPRAYS IN EACH NOSTRIL EVERY 12 HOURS 30 mL 3  . linagliptin (TRADJENTA) 5 MG TABS tablet Take 1 tablet (5 mg total) by mouth daily. insuranc would not cover januvia 30 tablet 5  . losartan (COZAAR) 25 MG tablet Take 1 tablet (25 mg total) by mouth daily. 90 tablet 1  . metFORMIN (GLUCOPHAGE) 1000 MG tablet Take 1 tablet (1,000 mg total) by mouth 2 (two) times daily with a meal. 180 tablet 3  . metoprolol succinate (TOPROL-XL) 50 MG 24 hr tablet TAKE 1 TABLET BY MOUTH EVERY DAY (Patient taking differently: TAKE 1 TABLET (50mg ) BY MOUTH EVERY DAY) 90 tablet 1  . niacin (NIASPAN) 500 MG CR tablet Take 1 tablet (500 mg  total) by mouth at bedtime. 90 tablet 3   No current facility-administered medications on file prior to visit.     ALLERGIES: Allergies  Allergen Reactions  . Lisinopril Cough    FAMILY HISTORY: Family History  Problem Relation Age of Onset  . Epilepsy Father   . Stroke Mother   . Lung cancer Unknown     SOCIAL HISTORY: Social History   Socioeconomic History  . Marital status: Married    Spouse name: Elior Robinette  . Number of children: 3  . Years of education: Not on file  . Highest education level: Not on file  Occupational History  . 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 Financial resource strain: Not on file  . Food insecurity:    Worry: Not on file    Inability: Not on file  . Transportation needs:    Medical: Not on file    Non-medical: Not on file  Tobacco Use  . Smoking status: Former Smoker    Packs/day: 1.00    Years: 22.00    Pack years: 22.00    Types: Cigarettes    Last attempt to quit: 03/14/1977     Years since quitting: 40.2  . Smokeless tobacco: Never Used  Substance and Sexual Activity  . Alcohol use: No  . Drug use: No  . Sexual activity: Never  Lifestyle  . Physical activity:    Days per week: Not on file    Minutes per session: Not on file  . Stress: Not on file  Relationships  . Social connections:    Talks on phone: Not on file    Gets together: Not on file    Attends religious service: Not on file    Active member of club or organization: Not on file    Attends meetings of clubs or organizations: Not on file    Relationship status: Not on file  . Intimate partner violence:    Fear of current or ex partner: Not on file    Emotionally abused: Not on file    Physically abused: Not on file    Forced sexual activity: Not on file  Other Topics Concern  . Not on file  Social History Narrative   3 caffeinated drinks per day. Mostly coffee. He walks 15 minutes 3 days per week. Quit smoking in 1978.    REVIEW OF SYSTEMS: Constitutional: No fevers, chills, or sweats, no generalized fatigue, change in appetite Eyes: No visual changes, double vision, eye pain Ear, nose and throat: No hearing loss, ear pain, nasal congestion, sore throat Cardiovascular: No chest pain, palpitations Respiratory:  No shortness of breath at rest or with exertion, wheezes GastrointestinaI: No nausea, vomiting, diarrhea, abdominal pain, fecal incontinence Genitourinary:  No dysuria, urinary retention or frequency Musculoskeletal:  No neck pain, back pain Integumentary: No rash, pruritus, skin lesions Neurological: as above Psychiatric: No depression, insomnia, anxiety Endocrine: No palpitations, fatigue, diaphoresis, mood swings, change in appetite, change in weight, increased thirst Hematologic/Lymphatic:  No anemia, purpura, petechiae. Allergic/Immunologic: no itchy/runny eyes, nasal congestion, recent allergic reactions, rashes  PHYSICAL EXAM: Vitals:   06/07/17 0900  BP: (!) 144/68   Pulse: 66  SpO2: 97%   General: No acute distress Head:  Normocephalic/atraumatic Eyes: Fundoscopic exam shows bilateral sharp discs, no vessel changes, exudates, or hemorrhages Neck: supple, no paraspinal tenderness, full range of motion Back: No paraspinal tenderness Heart: regular rate and rhythm Lungs:  Clear to auscultation bilaterally. Vascular: No carotid bruits. Skin/Extremities: No rash, no edema Neurological Exam: Mental status: alert and oriented to person, place, and time, no dysarthria or aphasia, Fund of knowledge is appropriate.  Recent and remote memory are intact.  Attention and concentration are normal.    Able to name objects and repeat phrases. Cranial nerves: CN I: not tested CN II: pupils equal, round and reactive to light, visual fields intact, fundi unremarkable. CN III, IV, VI:  full range of motion, no nystagmus, no ptosis CN V: facial sensation intact CN VII: upper and lower face symmetric CN VIII: hearing intact to finger rub CN IX, X: gag intact, uvula midline CN XI: sternocleidomastoid and trapezius muscles intact CN XII: tongue midline Bulk & Tone: normal, no fasciculations. Motor: 5/5 throughout with no pronator drift. Sensation: intact to light touch, cold, pin on both UE and LE. Decreased vibration to knees bilaterally. No extinction to double simultaneous stimulation.  Romberg test negative Deep Tendon Reflexes: +2 throughout, no ankle clonus Plantar responses: downgoing bilaterally Cerebellar: no incoordination on finger to nose, heel to shin. No dysdiadochokinesia Gait: slow and cautious, no ataxia Tremor: none  IMPRESSION: This is a 82 year old right-handed man with a history of hypertension, diabetes, CAD s/p CABG, post-operative atrial fibrillation, PVD, prior stroke with subsequent seizures, presenting to establish care. He has had recurrent episodes of confusion with aphasia and possible right-sided weakness. His stroke in 2016 presented  with left-sided weakness, MRI showed a small right posterior frontal lobe acute infarct. Subsequent confusional episodes did not show any acute MRI changes. He did get TPA with the last episode in January 2019 with significant improvement in symptoms, seizure was considered. EEG showed mild background slowing.  At this point we discussed continuation of antiepileptic medication, refills for Depakote ER 500mg  daily were sent. We discussed going to the ER for any sudden change in symptoms, and if he continues to have events on medication, we will do a 24-hour EEG to further classify episodes. We discussed secondary stroke prevention, continue control of vascular risk factors, daily aspirin. We discussed  driving laws, no driving after an episode of loss of awareness until 6 months event-free. He will follow-up in 6 months and knows to call for any changes.   Thank you for allowing me to participate in the care of this patient. Please do not hesitate to call for any questions or concerns.   Ellouise Newer, M.D.  CC: Dr. Madilyn Fireman

## 2017-06-07 NOTE — Patient Instructions (Signed)
1. Continue Depakote ER 500mg  daily 2. For sudden change in symptoms, go to ER immediately. If he has another episode of speech difficulties while on seizure medication, we will plan to do a 24-hour EEG 3. Continue control of blood pressure, cholesterol, diabetes 4. Follow-up in 6 months, call for any changes  Seizure Precautions: 1. If medication has been prescribed for you to prevent seizures, take it exactly as directed.  Do not stop taking the medicine without talking to your doctor first, even if you have not had a seizure in a long time.   2. Avoid activities in which a seizure would cause danger to yourself or to others.  Don't operate dangerous machinery, swim alone, or climb in high or dangerous places, such as on ladders, roofs, or girders.  Do not drive unless your doctor says you may.  3. If you have any warning that you may have a seizure, lay down in a safe place where you can't hurt yourself.    4.  No driving for 6 months from last seizure, as per [facility].   Please refer to the following link on the [facility] website for more information: http://[email or web address]/[email or web address]/drivingu.cfm   5.  Maintain good sleep hygiene. Avoid alcohol.  6.  Contact your doctor if you have any problems that may be related to the medicine you are taking.  7.  Call 911 and bring the patient back to the ED if:        A.  The seizure lasts longer than 5 minutes.       B.  The patient doesn't awaken shortly after the seizure  C.  The patient has new problems such as difficulty seeing, speaking or moving  D.  The patient was injured during the seizure  E.  The patient has a temperature over 102 F (39C)  F.  The patient vomited and now is having trouble breathing

## 2017-06-08 ENCOUNTER — Encounter: Payer: Self-pay | Admitting: Sports Medicine

## 2017-06-08 ENCOUNTER — Ambulatory Visit (INDEPENDENT_AMBULATORY_CARE_PROVIDER_SITE_OTHER): Payer: Medicare Other | Admitting: Sports Medicine

## 2017-06-08 DIAGNOSIS — I639 Cerebral infarction, unspecified: Secondary | ICD-10-CM | POA: Diagnosis not present

## 2017-06-08 DIAGNOSIS — M170 Bilateral primary osteoarthritis of knee: Secondary | ICD-10-CM | POA: Diagnosis not present

## 2017-06-08 MED ORDER — CELECOXIB 200 MG PO CAPS: ORAL_CAPSULE | ORAL | 2 refills | Status: DC

## 2017-06-08 NOTE — Assessment & Plan Note (Signed)
Temporary 2-week response to bilateral steroid injection. Switching to Orthovisc. He will return once he is approved, also adding Celebrex.

## 2017-06-08 NOTE — Progress Notes (Signed)
Subjective:    CC: Follow-up  HPI: This is a pleasant 82 year old male, 1 month ago we injected both of his knees, he did well, started to have a recurrence of pain in the right knee, left knee is doing okay.  I reviewed the past medical history, family history, social history, surgical history, and allergies today and no changes were needed.  Please see the problem list section below in epic for further details.  Past Medical History: Past Medical History:  Diagnosis Date  . Atrial fibrillation ([place])    Post operative  . CAD (coronary artery disease)    s/p cabg  . CHF (congestive heart failure) ([place])   . Diverticulosis   . DJD (degenerative joint disease)   . DM (diabetes mellitus) ([place])   . History of anemia   . History of stroke   . Hyperlipidemia   . Hypertension   . 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 Peripheral vascular disease ([place])   . Polyneuropathy, diabetic ([place])   . Seizure disorder, complex partial ([place])   . Syncope and collapse    Past Surgical History: Past Surgical History:  Procedure Laterality Date  . CATARACT EXTRACTION W/ INTRAOCULAR LENS  IMPLANT, BILATERAL  06/2013   Dr. [name]  . CORONARY ARTERY BYPASS GRAFT  02/13/2004   4 vesel  . removal of sebaceous cyst from neck  08/2007   Dr. [name]  . TONSILLECTOMY     Social History: Social History   Socioeconomic History  . Marital status: Married    Spouse name: Bates Collington  . Number of children: 3  . Years of education: Not on file  . Highest education level: Not on file  Occupational History  . 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 Financial resource strain: Not on file  . Food insecurity:    Worry: Not on file    Inability: Not on file  . Transportation needs:    Medical: Not on file    Non-medical: Not on file  Tobacco Use  . Smoking status: Former Smoker    Packs/day: 1.00    Years: 22.00     Pack years: 22.00    Types: Cigarettes    Last attempt to quit: 03/14/1977    Years since quitting: 40.2  . Smokeless tobacco: Never Used  Substance and Sexual Activity  . Alcohol use: No  . Drug use: No  . Sexual activity: Never  Lifestyle  . Physical activity:    Days per week: Not on file    Minutes per session: Not on file  . Stress: Not on file  Relationships  . Social connections:    Talks on phone: Not on file    Gets together: Not on file    Attends religious service: Not on file    Active member of club or organization: Not on file    Attends meetings of clubs or organizations: Not on file    Relationship status: Not on file  Other Topics Concern  . Not on file  Social History Narrative   3 caffeinated drinks per day. Mostly coffee. He walks 15 minutes 3 days per week. Quit smoking in 1978.      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    Family History: Family History  Problem Relation Age of Onset  . Epilepsy Father   . Stroke Mother   . Lung cancer Unknown    Allergies: Allergies  Allergen Reactions  . Lisinopril Cough   Medications: See med rec.  Review of Systems: No fevers, chills, night sweats, weight loss, chest pain, or shortness of breath.   Objective:    General: Well Developed, well nourished, and in no acute distress.  Neuro: Alert and oriented x3, extra-ocular muscles intact, sensation grossly intact.  HEENT: Normocephalic, atraumatic, pupils equal round reactive to light, neck supple, no masses, no lymphadenopathy, thyroid nonpalpable.  Skin: Warm and dry, no rashes. Cardiac: Regular rate and rhythm, no murmurs rubs or gallops, no lower extremity edema.  Respiratory: Clear to auscultation bilaterally. Not using accessory muscles, speaking in full sentences. Bilateral knees: Normal to inspection with no erythema or effusion or obvious bony  abnormalities. Palpation normal with no warmth or joint line tenderness or patellar tenderness or condyle tenderness. ROM normal in flexion and extension and lower leg rotation. Ligaments with solid consistent endpoints including ACL, PCL, LCL, MCL. Negative Mcmurray's and provocative meniscal tests. Non painful patellar compression. Patellar and quadriceps tendons unremarkable. Hamstring and quadriceps strength is normal.  Impression and Recommendations:    Primary osteoarthritis of both knees Temporary 2-week response to bilateral steroid injection. Switching to Orthovisc. He will return once he is approved, also adding Celebrex.  I spent 25 minutes with this patient, greater than 50% was face-to-face time counseling regarding the above diagnoses ___________________________________________ [name]. Dianah Field, M.D., ABFM., CAQSM. Primary Care and [place] Instructor of [place] [facility]

## 2017-06-13 ENCOUNTER — Telehealth: Payer: Self-pay | Admitting: Sports Medicine

## 2017-06-13 MED ORDER — HYALURONAN 30 MG/2ML IX SOSY: 1.0000 | PREFILLED_SYRINGE | INTRA_ARTICULAR | 0 refills | Status: DC

## 2017-06-13 NOTE — Telephone Encounter (Signed)
Done

## 2017-06-13 NOTE — Telephone Encounter (Signed)
Yes we do have to send to [facility] and we can't buy and bill. Thank you.

## 2017-06-13 NOTE — Telephone Encounter (Signed)
Thank you!  And just to clarify, we cannot buy and bill (use our own stash)?  I need to send to [place]?

## 2017-06-13 NOTE — Telephone Encounter (Signed)
Received letter from Orthovisc that it has been approved for both knees for this patient. The pharmacy that we order from is [facility]. It has been changed in the system.     Mabe, Beatris Ship, CMA  Mabe, Beatris Ship, CMA        Submitted information to [facility] and awaiting response.   Previous Messages    ----- Message -----  From: Silverio Decamp, MD  Sent: 06/08/2017  9:51 AM  To: Tasia Catchings, CMA   Bilateral Orthovisc approval please  ___________________________________________  [name]. Dianah Field, M.D., ABFM., CAQSM.  Primary Care and [place] Instructor of [place] [facility]

## 2017-06-20 ENCOUNTER — Encounter: Payer: Self-pay | Admitting: Neurology

## 2017-06-20 DIAGNOSIS — G40009 Localization-related (focal) (partial) idiopathic epilepsy and epileptic syndromes with seizures of localized onset, not intractable, without status epilepticus: Secondary | ICD-10-CM | POA: Insufficient documentation

## 2017-06-21 NOTE — Telephone Encounter (Signed)
Received fax from [place] that our office can buy and bill for the Orthovisc injections. Per Dr. Dianah Field we can schedule the patient. Patient has an appointment on 06/23/2017.

## 2017-06-23 ENCOUNTER — Ambulatory Visit: Payer: Medicare Other | Admitting: Sports Medicine

## 2017-06-23 DIAGNOSIS — Z0189 Encounter for other specified special examinations: Secondary | ICD-10-CM

## 2017-06-26 ENCOUNTER — Other Ambulatory Visit: Payer: Self-pay

## 2017-06-26 NOTE — Patient Outreach (Signed)
Telephone outreach to patient to obtain mRS was successfully completed. mRS = 0 

## 2017-06-27 ENCOUNTER — Encounter: Payer: Self-pay | Admitting: Sports Medicine

## 2017-06-27 ENCOUNTER — Telehealth: Payer: Self-pay | Admitting: *Deleted

## 2017-06-27 ENCOUNTER — Ambulatory Visit (INDEPENDENT_AMBULATORY_CARE_PROVIDER_SITE_OTHER): Payer: Medicare Other | Admitting: Sports Medicine

## 2017-06-27 DIAGNOSIS — M170 Bilateral primary osteoarthritis of knee: Secondary | ICD-10-CM

## 2017-06-27 NOTE — Progress Notes (Signed)

## 2017-06-27 NOTE — Assessment & Plan Note (Signed)
Orthovisc No. 1 of 4 into both knees, return in 1 week for #2 of 4. 

## 2017-07-04 ENCOUNTER — Ambulatory Visit (INDEPENDENT_AMBULATORY_CARE_PROVIDER_SITE_OTHER): Payer: Medicare Other | Admitting: Sports Medicine

## 2017-07-04 ENCOUNTER — Encounter: Payer: Self-pay | Admitting: Sports Medicine

## 2017-07-04 DIAGNOSIS — M170 Bilateral primary osteoarthritis of knee: Secondary | ICD-10-CM | POA: Diagnosis not present

## 2017-07-04 NOTE — Progress Notes (Signed)

## 2017-07-04 NOTE — Assessment & Plan Note (Signed)
Orthovisc injection #2 of 4 into both knees, return in 1 week for #3 of 4. Already starting to feel a bit of improvement.

## 2017-07-10 ENCOUNTER — Telehealth: Payer: Self-pay | Admitting: *Deleted

## 2017-07-10 NOTE — Telephone Encounter (Signed)
fmla forms for pt's daughter Fred [name] completed,faxed,confirmation received and scanned into chart.Elouise Munroe, [place]

## 2017-07-11 ENCOUNTER — Ambulatory Visit (INDEPENDENT_AMBULATORY_CARE_PROVIDER_SITE_OTHER): Payer: Medicare Other | Admitting: Sports Medicine

## 2017-07-11 ENCOUNTER — Encounter: Payer: Self-pay | Admitting: Sports Medicine

## 2017-07-11 DIAGNOSIS — M170 Bilateral primary osteoarthritis of knee: Secondary | ICD-10-CM

## 2017-07-11 NOTE — Assessment & Plan Note (Signed)
Orthovisc No. 3 of 4 into both knees, return in 1 week for #4 of 4. 

## 2017-07-11 NOTE — Progress Notes (Signed)

## 2017-07-18 ENCOUNTER — Ambulatory Visit (INDEPENDENT_AMBULATORY_CARE_PROVIDER_SITE_OTHER): Payer: Medicare Other | Admitting: Sports Medicine

## 2017-07-18 ENCOUNTER — Encounter: Payer: Self-pay | Admitting: Sports Medicine

## 2017-07-18 DIAGNOSIS — M170 Bilateral primary osteoarthritis of knee: Secondary | ICD-10-CM

## 2017-07-18 NOTE — Assessment & Plan Note (Addendum)
Orthovisc No. 4 of 4 into both knees. Currently pain-free, return as needed.

## 2017-07-18 NOTE — Progress Notes (Signed)
   Procedure: Real-time Ultrasound Guided Injection of left knee Device: GE Logiq E  Verbal informed consent obtained.  Time-out conducted.  Noted no overlying erythema, induration, or other signs of local infection.  Skin prepped in a sterile fashion.  Local anesthesia: Topical Ethyl chloride.  With sterile technique and under real time ultrasound guidance: 30 mg/2 mL of OrthoVisc (sodium hyaluronate) in a prefilled syringe was injected easily into the knee through a 22-gauge needle. Completed without difficulty  Pain immediately resolved suggesting accurate placement of the medication.  Advised to call if fevers/chills, erythema, induration, drainage, or persistent bleeding.  Images permanently stored and available for review in the ultrasound unit.  Impression: Technically successful ultrasound guided injection.  Procedure: Real-time Ultrasound Guided aspiration/injection ofrightknee Device: GE Logiq E  Verbal informed consent obtained.  Time-out conducted.  Noted no overlying erythema, induration, or other signs of local infection.  Skin prepped in a sterile fashion.  Local anesthesia: Topical Ethyl chloride.  With sterile technique and under real time ultrasound guidance: 30 mg/2 mL of OrthoVisc (sodium hyaluronate) in a prefilled syringe was injected easily into the knee through a 22-gauge needle. Completed without difficulty  Pain immediately resolved suggesting accurate placement of the medication.  Advised to call if fevers/chills, erythema, induration, drainage, or persistent bleeding.  Images permanently stored and available for review in the ultrasound unit.  Impression: Technically successful ultrasound guided injection.

## 2017-08-09 ENCOUNTER — Inpatient Hospital Stay (HOSPITAL_COMMUNITY)
Admission: EM | Admit: 2017-08-09 | Discharge: 2017-08-11 | DRG: 065 | Disposition: A | Discharge status: Rehabilitation Facility | Payer: Medicare Other | Attending: Internal Medicine | Admitting: Internal Medicine

## 2017-08-09 ENCOUNTER — Emergency Department (HOSPITAL_COMMUNITY): Payer: Medicare Other

## 2017-08-09 DIAGNOSIS — Z888 Allergy status to other drugs, medicaments and biological substances status: Secondary | ICD-10-CM

## 2017-08-09 DIAGNOSIS — Z823 Family history of stroke: Secondary | ICD-10-CM

## 2017-08-09 DIAGNOSIS — I509 Heart failure, unspecified: Secondary | ICD-10-CM

## 2017-08-09 DIAGNOSIS — I4891 Unspecified atrial fibrillation: Secondary | ICD-10-CM | POA: Diagnosis present

## 2017-08-09 DIAGNOSIS — I480 Paroxysmal atrial fibrillation: Secondary | ICD-10-CM | POA: Diagnosis present

## 2017-08-09 DIAGNOSIS — E1159 Type 2 diabetes mellitus with other circulatory complications: Secondary | ICD-10-CM | POA: Diagnosis present

## 2017-08-09 DIAGNOSIS — N182 Chronic kidney disease, stage 2 (mild): Secondary | ICD-10-CM | POA: Diagnosis present

## 2017-08-09 DIAGNOSIS — G40009 Localization-related (focal) (partial) idiopathic epilepsy and epileptic syndromes with seizures of localized onset, not intractable, without status epilepticus: Secondary | ICD-10-CM | POA: Diagnosis present

## 2017-08-09 DIAGNOSIS — I152 Hypertension secondary to endocrine disorders: Secondary | ICD-10-CM | POA: Diagnosis present

## 2017-08-09 DIAGNOSIS — R29810 Facial weakness: Secondary | ICD-10-CM | POA: Diagnosis not present

## 2017-08-09 DIAGNOSIS — I639 Cerebral infarction, unspecified: Secondary | ICD-10-CM | POA: Diagnosis not present

## 2017-08-09 DIAGNOSIS — I5032 Chronic diastolic (congestive) heart failure: Secondary | ICD-10-CM | POA: Diagnosis present

## 2017-08-09 DIAGNOSIS — Z9114 Patient's other noncompliance with medication regimen: Secondary | ICD-10-CM

## 2017-08-09 DIAGNOSIS — R410 Disorientation, unspecified: Secondary | ICD-10-CM | POA: Diagnosis not present

## 2017-08-09 DIAGNOSIS — R531 Weakness: Secondary | ICD-10-CM | POA: Diagnosis not present

## 2017-08-09 DIAGNOSIS — E669 Obesity, unspecified: Secondary | ICD-10-CM | POA: Diagnosis present

## 2017-08-09 DIAGNOSIS — Z8673 Personal history of transient ischemic attack (TIA), and cerebral infarction without residual deficits: Secondary | ICD-10-CM

## 2017-08-09 DIAGNOSIS — M109 Gout, unspecified: Secondary | ICD-10-CM | POA: Diagnosis present

## 2017-08-09 DIAGNOSIS — G8194 Hemiplegia, unspecified affecting left nondominant side: Secondary | ICD-10-CM | POA: Diagnosis present

## 2017-08-09 DIAGNOSIS — Z6832 Body mass index (BMI) 32.0-32.9, adult: Secondary | ICD-10-CM

## 2017-08-09 DIAGNOSIS — Z820 Family history of epilepsy and other diseases of the nervous system: Secondary | ICD-10-CM

## 2017-08-09 DIAGNOSIS — I130 Hypertensive heart and chronic kidney disease with heart failure and stage 1 through stage 4 chronic kidney disease, or unspecified chronic kidney disease: Secondary | ICD-10-CM | POA: Diagnosis present

## 2017-08-09 DIAGNOSIS — G40209 Localization-related (focal) (partial) symptomatic epilepsy and epileptic syndromes with complex partial seizures, not intractable, without status epilepticus: Secondary | ICD-10-CM | POA: Diagnosis present

## 2017-08-09 DIAGNOSIS — Z87891 Personal history of nicotine dependence: Secondary | ICD-10-CM

## 2017-08-09 DIAGNOSIS — E1142 Type 2 diabetes mellitus with diabetic polyneuropathy: Secondary | ICD-10-CM | POA: Diagnosis present

## 2017-08-09 DIAGNOSIS — N179 Acute kidney failure, unspecified: Secondary | ICD-10-CM | POA: Diagnosis present

## 2017-08-09 DIAGNOSIS — Z9842 Cataract extraction status, left eye: Secondary | ICD-10-CM

## 2017-08-09 DIAGNOSIS — Z961 Presence of intraocular lens: Secondary | ICD-10-CM | POA: Diagnosis present

## 2017-08-09 DIAGNOSIS — Z7982 Long term (current) use of aspirin: Secondary | ICD-10-CM

## 2017-08-09 DIAGNOSIS — I10 Essential (primary) hypertension: Secondary | ICD-10-CM | POA: Diagnosis present

## 2017-08-09 DIAGNOSIS — R471 Dysarthria and anarthria: Secondary | ICD-10-CM | POA: Diagnosis present

## 2017-08-09 DIAGNOSIS — M170 Bilateral primary osteoarthritis of knee: Secondary | ICD-10-CM | POA: Diagnosis present

## 2017-08-09 DIAGNOSIS — D649 Anemia, unspecified: Secondary | ICD-10-CM | POA: Diagnosis present

## 2017-08-09 DIAGNOSIS — I6389 Other cerebral infarction: Principal | ICD-10-CM | POA: Diagnosis present

## 2017-08-09 DIAGNOSIS — F1010 Alcohol abuse, uncomplicated: Secondary | ICD-10-CM | POA: Diagnosis present

## 2017-08-09 DIAGNOSIS — I6523 Occlusion and stenosis of bilateral carotid arteries: Secondary | ICD-10-CM | POA: Diagnosis present

## 2017-08-09 DIAGNOSIS — Z8739 Personal history of other diseases of the musculoskeletal system and connective tissue: Secondary | ICD-10-CM

## 2017-08-09 DIAGNOSIS — Z9841 Cataract extraction status, right eye: Secondary | ICD-10-CM

## 2017-08-09 DIAGNOSIS — I2510 Atherosclerotic heart disease of native coronary artery without angina pectoris: Secondary | ICD-10-CM | POA: Diagnosis present

## 2017-08-09 DIAGNOSIS — E1151 Type 2 diabetes mellitus with diabetic peripheral angiopathy without gangrene: Secondary | ICD-10-CM | POA: Diagnosis present

## 2017-08-09 DIAGNOSIS — R29701 NIHSS score 1: Secondary | ICD-10-CM | POA: Diagnosis present

## 2017-08-09 DIAGNOSIS — E785 Hyperlipidemia, unspecified: Secondary | ICD-10-CM | POA: Diagnosis present

## 2017-08-09 DIAGNOSIS — Z79899 Other long term (current) drug therapy: Secondary | ICD-10-CM

## 2017-08-09 DIAGNOSIS — E1122 Type 2 diabetes mellitus with diabetic chronic kidney disease: Secondary | ICD-10-CM | POA: Diagnosis present

## 2017-08-09 DIAGNOSIS — E118 Type 2 diabetes mellitus with unspecified complications: Secondary | ICD-10-CM

## 2017-08-09 DIAGNOSIS — Z794 Long term (current) use of insulin: Secondary | ICD-10-CM

## 2017-08-09 DIAGNOSIS — Z951 Presence of aortocoronary bypass graft: Secondary | ICD-10-CM

## 2017-08-09 LAB — DIFFERENTIAL
ABS IMMATURE GRANULOCYTES: 0 10*3/uL (ref 0.0–0.1)
BASOS PCT: 1 %
Basophils Absolute: 0.1 10*3/uL (ref 0.0–0.1)
Eosinophils Absolute: 0.2 10*3/uL (ref 0.0–0.7)
Eosinophils Relative: 3 %
Immature Granulocytes: 1 %
LYMPHS PCT: 44 %
Lymphs Abs: 3.3 10*3/uL (ref 0.7–4.0)
MONOS PCT: 11 %
Monocytes Absolute: 0.8 10*3/uL (ref 0.1–1.0)
NEUTROS ABS: 3.2 10*3/uL (ref 1.7–7.7)
NEUTROS PCT: 42 %

## 2017-08-09 LAB — I-STAT CHEM 8, ED
BUN: 30 mg/dL — AB (ref 6–20)
CALCIUM ION: 1.23 mmol/L (ref 1.15–1.40)
CHLORIDE: 100 mmol/L — AB (ref 101–111)
CREATININE: 1.3 mg/dL — AB (ref 0.61–1.24)
Glucose, Bld: 99 mg/dL (ref 65–99)
HCT: 44 % (ref 39.0–52.0)
Hemoglobin: 15 g/dL (ref 13.0–17.0)
Potassium: 4.6 mmol/L (ref 3.5–5.1)
Sodium: 140 mmol/L (ref 135–145)
TCO2: 26 mmol/L (ref 22–32)

## 2017-08-09 LAB — CBC
HCT: 42.4 % (ref 39.0–52.0)
HEMOGLOBIN: 14.3 g/dL (ref 13.0–17.0)
MCH: 31.6 pg (ref 26.0–34.0)
MCHC: 33.7 g/dL (ref 30.0–36.0)
MCV: 93.8 fL (ref 78.0–100.0)
Platelets: 253 10*3/uL (ref 150–400)
RBC: 4.52 MIL/uL (ref 4.22–5.81)
RDW: 12.7 % (ref 11.5–15.5)
WBC: 7.6 10*3/uL (ref 4.0–10.5)

## 2017-08-09 LAB — PROTIME-INR
INR: 0.99
Prothrombin Time: 13 seconds (ref 11.4–15.2)

## 2017-08-09 LAB — CBG MONITORING, ED: GLUCOSE-CAPILLARY: 104 mg/dL — AB (ref 65–99)

## 2017-08-09 LAB — APTT: aPTT: 25 seconds (ref 24–36)

## 2017-08-09 LAB — I-STAT TROPONIN, ED: Troponin i, poc: 0 ng/mL (ref 0.00–0.08)

## 2017-08-09 MED ORDER — IOPAMIDOL (ISOVUE-370) INJECTION 76%
50.0000 mL | Freq: Once | INTRAVENOUS | Status: AC | PRN
  Administered 2017-08-09: 50 mL via INTRAVENOUS

## 2017-08-09 NOTE — ED Notes (Signed)
CBG 104 

## 2017-08-09 NOTE — Consult Note (Addendum)
Neurology Consultation  Reason for Consult: Code Stroke Referring Physician: Dr. Wyvonnia Dusky  CC: code stroke  History is obtained from: patient, chart, family at bedside  HPI: Fred [name] is a 82 y.o. male with PMH of complex partial seizure, stroke affecting left side with no residual deficits, operative Afib during CABG with no anticoagulation on board, recent binge drinking due to personal stressors, CAD s/p CABG, HTN, HLD, who was in usual state of health till 8pm, when around 8:15 pm his granddaughter noted that he was having some difficulty with his left side and difficulty getting up from the chair.  He also noted some left-sided facial droop and that his speech is dysarthric. The report that lately, over the past couple months, he has been very stressed out with his wife being diagnosed with Lewy body dementia.  He has been drinking a lot.  They have been trying to hide his alcohol in the house but they find him finding letter around and consuming it. His appetite is also been poor and he is only been having 1 meal a day. He was seen in January 2019 for garbled speech and nonlocalizing symptoms concerning for stroke, was given IV TPA as he was within the window for IV TPA, and an MRI later revealed no stroke at that time. It was deemed to be either a MRI negative stroke versus Compass partial seizure which he has history of. No preceding illnesses or sicknesses.  Only pertinent history preceding is of poor p.o. intake as well as binge drinking.  He reports having had a couple of beers today as well.   LKW: 8 PM on 08/09/2017 tpa given?: no, low nihss Premorbid modified Rankin scale (mRS): 3   ROS: ROS was performed and is negative except as noted in the HPI.  Past Medical History:  Diagnosis Date  . Atrial fibrillation ([place])    Post operative  . CAD (coronary artery disease)    s/p cabg  . CHF (congestive heart failure) ([place])   . Diverticulosis   . DJD (degenerative joint  disease)   . DM (diabetes mellitus) ([place])   . History of anemia   . History of stroke   . Hyperlipidemia   . Hypertension   . 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 Peripheral vascular disease ([place])   . Polyneuropathy, diabetic ([place])   . Seizure disorder, complex partial ([place])   . Syncope and collapse     Family History  Problem Relation Age of Onset  . Epilepsy Father   . Stroke Mother   . Lung cancer Unknown     Social History:   reports that he quit smoking about 40 years ago. His smoking use included cigarettes. He has a 22.00 pack-year smoking history. He has never used smokeless tobacco. He reports that he does not drink alcohol or use drugs. Drinks multiple drinks a day  Medications No current facility-administered medications for this encounter.   Current Outpatient Medications:  .  allopurinol (ZYLOPRIM) 300 MG tablet, Take 1 tablet (300 mg total) by mouth daily., Disp: 90 tablet, Rfl: 4 .  aspirin 325 MG tablet, Take 325 mg by mouth daily., Disp: , Rfl:  .  atorvastatin (LIPITOR) 40 MG tablet, Take 1 tablet (40 mg total) by mouth daily., Disp: 90 tablet, Rfl: 1 .  celecoxib (CELEBREX) 200 MG capsule, One to 2 tablets by mouth daily as needed for pain., Disp: 60 capsule, Rfl: 2 .  Coenzyme Q10 (COQ10) 30  MG CAPS, Take 1 tablet by mouth daily.  , Disp: , Rfl:  .  divalproex (DEPAKOTE ER) 500 MG 24 hr tablet, Take 1 tablet (500 mg total) by mouth daily., Disp: 30 tablet, Rfl: 11 .  ezetimibe (ZETIA) 10 MG tablet, Take 1 tablet (10 mg total) by mouth daily., Disp: 90 tablet, Rfl: 3 .  furosemide (LASIX) 40 MG tablet, TAKE 1 TABLET BY MOUTH EVERY DAY AS NEEDED FOR FLUID RETENTION (Patient taking differently: TAKE 1 TABLET (40mg ) BY MOUTH EVERY DAY AS NEEDED FOR FLUID RETENTION), Disp: 90 tablet, Rfl: 1 .  glimepiride (AMARYL) 4 MG tablet, Take 1 tablet (4 mg total) by mouth daily with breakfast., Disp: 90 tablet, Rfl: 3 .  Hyaluronan (ORTHOVISC) 30 MG/2ML  SOSY, Inject 1 Syringe into the articular space once a week., Disp: 8 Syringe, Rfl: 0 .  Insulin Glargine (LANTUS SOLOSTAR) 100 UNIT/ML Solostar Pen, Inject 10-20 Units into the skin daily at 10 pm., Disp: 15 mL, Rfl: 11 .  ipratropium (ATROVENT) 0.03 % nasal spray, USE 2 SPRAYS IN EACH NOSTRIL EVERY 12 HOURS, Disp: 30 mL, Rfl: 3 .  linagliptin (TRADJENTA) 5 MG TABS tablet, Take 1 tablet (5 mg total) by mouth daily. insuranc would not cover januvia, Disp: 30 tablet, Rfl: 5 .  losartan (COZAAR) 25 MG tablet, Take 1 tablet (25 mg total) by mouth daily., Disp: 90 tablet, Rfl: 1 .  metFORMIN (GLUCOPHAGE) 1000 MG tablet, Take 1 tablet (1,000 mg total) by mouth 2 (two) times daily with a meal., Disp: 180 tablet, Rfl: 3 .  metoprolol succinate (TOPROL-XL) 50 MG 24 hr tablet, TAKE 1 TABLET BY MOUTH EVERY DAY (Patient taking differently: TAKE 1 TABLET (50mg ) BY MOUTH EVERY DAY), Disp: 90 tablet, Rfl: 1 .  niacin (NIASPAN) 500 MG CR tablet, Take 1 tablet (500 mg total) by mouth at bedtime., Disp: 90 tablet, Rfl: 3  Exam: Current vital signs: BP (!) 162/62 (BP Location: Right Arm)   Pulse 72   Temp 98.1 F (36.7 C) (Oral)   Resp 20   SpO2 99%  Vital signs in last 24 hours: Temp:  [98.1 F (36.7 C)] 98.1 F (36.7 C) (05/29 2324) Pulse Rate:  [72] 72 (05/29 2324) Resp:  [20] 20 (05/29 2324) BP: (162)/(62) 162/62 (05/29 2324) SpO2:  [99 %] 99 % (05/29 2324)  GENERAL: Awake, alert in NAD HEENT: - Normocephalic and atraumatic, dry mm, no LN++, no Thyromegally LUNGS - Clear to auscultation bilaterally with no wheezes CV - S1S2 RRR, no m/r/g, equal pulses bilaterally. ABDOMEN - Soft, nontender, nondistended with normoactive BS Ext: warm, well perfused, intact peripheral pulses, no edema  NEURO:  Mental Status: AA&Ox3  Language: speech is mildly dysarthric.  Naming, repetition, fluency, and comprehension intact. Cranial Nerves: PERRL. EOMI, visual fields full, no facial asymmetry, facial sensation  intact, hearing intact, tongue/uvula/soft palate midline, normal sternocleidomastoid and trapezius muscle strength. No evidence of tongue atrophy or fibrillations Motor: 4+/5 LUE and LLE with no vertical drift. Right side 5/5 Tone: is normal and bulk is normal Sensation- Intact to light touch bilaterally Coordination: FTN intact bilaterally Gait- deferred  NIHSS - 1 for dysarthria  Labs I have reviewed labs in epic and the results pertinent to this consultation are: CBC    Component Value Date/Time   WBC 7.6 08/09/2017 2328   RBC 4.52 08/09/2017 2328   HGB 15.0 08/09/2017 2334   HCT 44.0 08/09/2017 2334   PLT 253 08/09/2017 2328   MCV 93.8 08/09/2017 2328  MCH 31.6 08/09/2017 2328   MCHC 33.7 08/09/2017 2328   RDW 12.7 08/09/2017 2328   LYMPHSABS 3.3 08/09/2017 2328   MONOABS 0.8 08/09/2017 2328   EOSABS 0.2 08/09/2017 2328   BASOSABS 0.1 08/09/2017 2328    CMP     Component Value Date/Time   NA 140 08/09/2017 2334   K 4.6 08/09/2017 2334   CL 100 (L) 08/09/2017 2334   CO2 26 08/09/2017 2328   GLUCOSE 99 08/09/2017 2334   BUN 30 (H) 08/09/2017 2334   CREATININE 1.30 (H) 08/09/2017 2334   CREATININE 1.18 11/09/2016 1014   CALCIUM 9.7 08/09/2017 2328   PROT 7.2 08/09/2017 2328   ALBUMIN 3.9 08/09/2017 2328   AST 22 08/09/2017 2328   ALT 22 08/09/2017 2328   ALKPHOS 39 08/09/2017 2328   BILITOT 0.6 08/09/2017 2328   GFRNONAA 45 (L) 08/09/2017 2328   GFRNONAA 58 (L) 11/09/2016 1014   GFRAA 52 (L) 08/09/2017 2328   GFRAA 67 11/09/2016 1014   Shows AKI  Imaging I have reviewed the images obtained:  CT-scan of the brain - no acute changes,. Multiple old lacunes and atrophy CTA: no ELVO  MRI examination of the brain-pending . See addendum at the end  Assessment:  82/M with PMH as above, presenting for eval of LSW, LFD and slurred speech. On my exam, mild left hemiparesis and dysarthria noted. NIH 1. Multiple other issues reported by family- stress with newly  diagnosed dementia in wife which he is struggling with, excessive Etoh consumption as well as poor PO intake. This could be a stroke/TIA (he has all the risk factors) or seizure (has history of seizures). Or, if imaging is negative, this could all be recrudescence of old stroke symptoms. Factors contributing to this could be poor po intake and AKI that is evident on recent labs from today. CTH and CTA head/neck negative for acute change or ELVO.  Impression: Evaluate for AIS vs TIA Evaluate for complex partial seizures Evaluate for toxic metabolic encephalopathy Evaluate for recrudescence of old symptoms AKI HTN DM  Recommendations: -STAT MRI Brain. If negative, no need for stroke w/u. If positive, admit for stroke w/u. -Check Etoh level -Check depakote level. -Check B12 level -Check thiamine level -IV thiamine replacement followed by PO replacement (after level is drawn- this result might take time to come back). Not a tPA candidate due to low NIHSS and rapidly improving symptoms. Not a EVT candidate due to no LVO signs and no LVO on CTA.  -- Amie Portland, MD [facility] Pager: [phone] If 7pm to 7am, please call on call as listed on AMION.  Addendum after MRI brain that I personally reviewed: Faint area of restricted diffusion in the posterior limb of the right internal capsule, which can very well explain the left-sided weakness. Likely small vessel stroke. Admit for stroke work-up as below: -Admit to hospitalist -Telemetry monitoring -Allow for permissive hypertension for the first 24-48h - only treat PRN if SBP >220 mmHg. Blood pressures can be gradually normalized to SBP<140 upon discharge. -Echocardiogram -HgbA1c, fasting lipid panel -Frequent neuro checks -Prophylactic therapy-Antiplatelet med: Aspirin - dose 325mg  PO or 300mg  PR-might need dual antiplatelet for 3 months. -Atorvastatin 80 mg PO daily -Risk factor modification -I discussed the  importance of exercise as well as smoking/alcohol/illicit drug use cessation. -PT consult, OT consult, Speech consult -Check B12, valproate level, thiamine level. -Replete high-dose thiamine IV after labs have been collected.  Discharge on oral thiamine. -Continue home dose of valproate  for now -Maintain seizure precautions  Please page stroke NP/PA/MD (listed on AMION)  from 8am-4 pm as this patient will be followed by the stroke team at this point.   -- Amie Portland, MD [facility] Pager: [phone] If 7pm to 7am, please call on call as listed on AMION.

## 2017-08-09 NOTE — ED Provider Notes (Signed)
[facility] EMERGENCY DEPARTMENT Provider Note   CSN: [id number] Arrival date & time: 08/09/17  2319     History   Chief Complaint No chief complaint on file.   HPI Fred [name] is a 82 y.o. male.  Patient with history of CAD status post CABG, diabetes, hypertension, hyperlipidemia, atrial fibrillation not on anticoagulation presenting with left-sided weakness and confusion.  Last seen normal around 8 PM.  Patient states he try to stand up but could not because he was weak and slid down to the ground.  He feels that he is weak more in his left side than his right.  Family noticed some facial droop and slurred speech.  He denies any pain.  He did not hit his head when he fell.   they do not think he is on any blood thinners.  No abdominal pain, nausea or vomiting.  The history is provided by the patient and a relative. The history is limited by the condition of the patient.    Past Medical History:  Diagnosis Date  . Atrial fibrillation ([place])    Post operative  . CAD (coronary artery disease)    s/p cabg  . CHF (congestive heart failure) ([place])   . Diverticulosis   . DJD (degenerative joint disease)   . DM (diabetes mellitus) ([place])   . History of anemia   . History of stroke   . Hyperlipidemia   . Hypertension   . 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 Peripheral vascular disease ([place])   . Polyneuropathy, diabetic ([place])   . Seizure disorder, complex partial ([place])   . Syncope and collapse     Patient Active Problem List   Diagnosis Date Noted  . 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 of localized onset, not intractable, without status epilepticus ([place]) 06/20/2017  . Primary osteoarthritis of both knees 05/11/2017  . Stroke (cerebrum) ([place]) 03/21/2017  . Type 2 diabetes mellitus with complication, with long-term current use of insulin ([place]) 03/27/2015  . Atherosclerosis of native coronary artery  of native heart without angina pectoris 03/27/2015  . Palpitations 11/04/2014  . S/P CABG (coronary artery bypass graft) 11/04/2014  . Atrial fibrillation, unspecified   . History of stroke 09/04/2014  . Obesity (BMI 30-39.9) 11/29/2013  . History of gout 05/16/2013  . Congestive heart failure ([place]) 04/20/2009  . DYSPNEA 02/11/2009  . CARDIOVASCULAR FUNCTION STUDY, ABNORMAL 01/29/2009  . NECK PAIN 08/03/2007  . ANEMIA / OTHER 05/16/2007  . Coronary atherosclerosis 05/16/2007  . PAROXYSMAL ATRIAL FIBRILLATION 05/16/2007  . Cerebral artery occlusion with cerebral infarction ([place]) 05/16/2007  . Hyperlipidemia 03/30/2007  . DEGENERATIVE JOINT DISEASE 03/30/2007  . PAROTID LESION, UNSPECIFIED 09/29/2006  . Partial epilepsy with impairment of consciousness ([place]) 09/29/2006  . POLYNEUROPATHY-DIABETIC 09/29/2006  . Essential hypertension 09/29/2006  . PERIPHERAL VASCULAR DISEASE 09/29/2006  . DIVERTICULOSIS 09/29/2006    Past Surgical History:  Procedure Laterality Date  . CATARACT EXTRACTION W/ INTRAOCULAR LENS  IMPLANT, BILATERAL  06/2013   Dr. [name]  . CORONARY ARTERY BYPASS GRAFT  02/13/2004   4 vesel  . removal of sebaceous cyst from neck  08/2007   Dr. [name]  . TONSILLECTOMY          Home Medications    Prior to Admission medications   Medication Sig Start Date End Date Taking? Authorizing Provider  allopurinol (ZYLOPRIM) 300 MG tablet Take 1 tablet (300 mg total) by mouth daily. 08/02/16   M[place],  Rene Kocher, MD  aspirin 325 MG tablet Take 325 mg by mouth daily.    [provider]  atorvastatin (LIPITOR) 40 MG tablet Take 1 tablet (40 mg total) by mouth daily. 08/02/16   [name], MD  celecoxib (CELEBREX) 200 MG capsule One to 2 tablets by mouth daily as needed for pain. 06/08/17   Silverio Decamp, MD  Coenzyme Q10 (COQ10) 30 MG CAPS Take 1 tablet by mouth daily.      [provider]  divalproex (DEPAKOTE ER) 500 MG 24 hr  tablet Take 1 tablet (500 mg total) by mouth daily. 06/07/17   Cameron Sprang, MD  ezetimibe (ZETIA) 10 MG tablet Take 1 tablet (10 mg total) by mouth daily. 11/09/16   [name], MD  furosemide (LASIX) 40 MG tablet TAKE 1 TABLET BY MOUTH EVERY DAY AS NEEDED FOR FLUID RETENTION Patient taking differently: TAKE 1 TABLET (40mg ) BY MOUTH EVERY DAY AS NEEDED FOR FLUID RETENTION 10/15/15   [name], MD  glimepiride (AMARYL) 4 MG tablet Take 1 tablet (4 mg total) by mouth daily with breakfast. 05/11/17   [name], MD  Hyaluronan (ORTHOVISC) 30 MG/2ML SOSY Inject 1 Syringe into the articular space once a week. 06/13/17   Silverio Decamp, MD  Insulin Glargine (LANTUS SOLOSTAR) 100 UNIT/ML Solostar Pen Inject 10-20 Units into the skin daily at 10 pm. 08/02/16   [name], MD  ipratropium (ATROVENT) 0.03 % nasal spray USE 2 SPRAYS IN EACH NOSTRIL EVERY 12 HOURS 02/24/16   [name], MD  linagliptin (TRADJENTA) 5 MG TABS tablet Take 1 tablet (5 mg total) by mouth daily. insuranc would not cover januvia 04/07/17   [name], MD  losartan (COZAAR) 25 MG tablet Take 1 tablet (25 mg total) by mouth daily. 08/02/16   [name], MD  metFORMIN (GLUCOPHAGE) 1000 MG tablet Take 1 tablet (1,000 mg total) by mouth 2 (two) times daily with a meal. 08/02/16   [name], MD  metoprolol succinate (TOPROL-XL) 50 MG 24 hr tablet TAKE 1 TABLET BY MOUTH EVERY DAY Patient taking differently: TAKE 1 TABLET (50mg ) BY MOUTH EVERY DAY 01/12/15   [name], MD  niacin (NIASPAN) 500 MG CR tablet Take 1 tablet (500 mg total) by mouth at bedtime. 08/02/16   [name], MD    Family History Family History  Problem Relation Age of Onset  . Epilepsy Father   . Stroke Mother   . Lung cancer Unknown     Social History Social History   Tobacco Use  . Smoking status: Former Smoker    Packs/day: 1.00    Years: 22.00      Pack years: 22.00    Types: Cigarettes    Last attempt to quit: 03/14/1977    Years since quitting: 40.4  . Smokeless tobacco: Never Used  Substance Use Topics  . Alcohol use: No  . Drug use: No     Allergies   Lisinopril   Review of Systems Review of Systems  Constitutional: Negative for activity change, appetite change and fever.  Respiratory: Negative for cough and shortness of breath.   Cardiovascular: Negative for chest pain.  Gastrointestinal: Negative for abdominal pain, nausea and vomiting.  Genitourinary: Negative for dysuria and hematuria.  Musculoskeletal: Negative for arthralgias and myalgias.  Skin: Negative for rash.  Neurological: Positive for dizziness, speech difficulty, weakness, light-headedness and numbness. Negative for headaches.   all other  systems are negative except as noted in the HPI and PMH.     Physical Exam Updated Vital Signs BP (!) 162/62 (BP Location: Right Arm)   Pulse 72   Temp 98.1 F (36.7 C) (Oral)   Resp 20   SpO2 99%   Physical Exam  Constitutional: He is oriented to person, place, and time. He appears well-developed and well-nourished. No distress.  HENT:  Head: Normocephalic and atraumatic.  Mouth/Throat: Oropharynx is clear and moist. No oropharyngeal exudate.  Eyes: Pupils are equal, round, and reactive to light. Conjunctivae and EOM are normal.  Neck: Normal range of motion. Neck supple.  No meningismus.  Cardiovascular: Normal rate, regular rhythm, normal heart sounds and intact distal pulses.  No murmur heard. Pulmonary/Chest: Effort normal and breath sounds normal. No respiratory distress.  Abdominal: Soft. There is no tenderness. There is no rebound and no guarding.  Musculoskeletal: Normal range of motion. He exhibits no edema or tenderness.  Neurological: He is alert and oriented to person, place, and time. A cranial nerve deficit is present. He exhibits normal muscle tone. Coordination normal.  Slight left-sided  facial droop, tongue is midline, mildly dysarthric speech, 4/5 strength of left upper extremity and left lower extremity.  There is pronator drift of the left arm.  Skin: Skin is warm.  Psychiatric: He has a normal mood and affect. His behavior is normal.  Nursing note and vitals reviewed.    ED Treatments / Results  Labs (all labs ordered are listed, but only abnormal results are displayed) Labs Reviewed  COMPREHENSIVE METABOLIC PANEL - Abnormal; Notable for the following components:      Result Value   Glucose, Bld 100 (*)    BUN 27 (*)    Creatinine, Ser 1.43 (*)    GFR calc non Af Amer 45 (*)    GFR calc Af Amer 52 (*)    All other components within normal limits  URINALYSIS, ROUTINE W REFLEX MICROSCOPIC - Abnormal; Notable for the following components:   Color, Urine STRAW (*)    All other components within normal limits  VALPROIC ACID LEVEL - Abnormal; Notable for the following components:   Valproic Acid Lvl 16 (*)    All other components within normal limits  HEMOGLOBIN A1C - Abnormal; Notable for the following components:   Hgb A1c MFr Bld 6.0 (*)    All other components within normal limits  LIPID PANEL - Abnormal; Notable for the following components:   HDL 40 (*)    LDL Cholesterol 130 (*)    All other components within normal limits  I-STAT CHEM 8, ED - Abnormal; Notable for the following components:   Chloride 100 (*)    BUN 30 (*)    Creatinine, Ser 1.30 (*)    All other components within normal limits  CBG MONITORING, ED - Abnormal; Notable for the following components:   Glucose-Capillary 104 (*)    All other components within normal limits  CBG MONITORING, ED - Abnormal; Notable for the following components:   Glucose-Capillary 130 (*)    All other components within normal limits  ETHANOL  PROTIME-INR  APTT  CBC  DIFFERENTIAL  RAPID URINE DRUG SCREEN, HOSP PERFORMED  VITAMIN B1  VITAMIN B12  TROPONIN I  I-STAT TROPONIN, ED    EKG EKG  Interpretation  Date/Time:  Wednesday Aug 09 2017 23:23:46 EDT Ventricular Rate:  75 PR Interval:  134 QRS Duration: 94 QT Interval:  376 QTC Calculation: 419 R Axis:   -  43 Text Interpretation:  Normal sinus rhythm Left axis deviation Abnormal ECG T wave inversion III, avF Confirmed by Ezequiel Essex [phone]) on 08/09/2017 11:30:55 PM   Radiology Ct Angio Head W Or Wo Contrast  Result Date: 08/10/2017 CLINICAL DATA:  82 y/o  M; left-sided weakness and facial droop. EXAM: CT ANGIOGRAPHY HEAD AND NECK TECHNIQUE: Multidetector CT imaging of the head and neck was performed using the standard protocol during bolus administration of intravenous contrast. Multiplanar CT image reconstructions and MIPs were obtained to evaluate the vascular anatomy. Carotid stenosis measurements (when applicable) are obtained utilizing NASCET criteria, using the distal internal carotid diameter as the denominator. CONTRAST:  43mL ISOVUE-370 IOPAMIDOL (ISOVUE-370) INJECTION 76% COMPARISON:  03/21/2017 CTA of head and neck. 03/21/2017 MRI head. 08/09/2017 CT head. FINDINGS: CTA NECK FINDINGS Aortic arch: Three-vessel arch. Moderate calcific atherosclerosis. Partially visualized sternotomy, LIMA bypass, and saphenous grafts. Right carotid system: No evidence of dissection, stenosis (50% or greater) or occlusion. Calcified plaque of the right carotid bifurcation with mild less than 50% proximal ICA stenosis. Left carotid system: No evidence of dissection or occlusion. Calcified plaque of left carotid bifurcation with moderate to severe 70% proximal ICA stenosis. Vertebral arteries: Calcified plaque of bilateral vertebral origin stenosis with mild-to-moderate stenosis. Right dominant system. No additional segment of stenosis, dissection, aneurysm, or occlusion. Skeleton: Mild reversal of cervical curvature and grade 1 anterolisthesis at the C5-6 level. Mild discogenic degenerative changes at C5-C7. No high-grade bony canal  stenosis. Other neck: Negative. Upper chest: Negative. Review of the MIP images confirms the above findings CTA HEAD FINDINGS Anterior circulation: No significant stenosis, proximal occlusion, aneurysm, or vascular malformation. Calcified plaque of bilateral carotid siphons with mild less than 50% stenosis. Posterior circulation: No significant stenosis, proximal occlusion, aneurysm, or vascular malformation. Venous sinuses: As permitted by contrast timing, patent. Anatomic variants: Small anterior communicating artery. No posterior communicating artery identified, likely hypoplastic or absent. Review of the MIP images confirms the above findings IMPRESSION: 1. Patent carotid and vertebral arteries. 2. Patent anterior and posterior intracranial circulation. No large vessel occlusion, aneurysm, or high-grade stenosis. 3. Stable mild less than 50% proximal right ICA stenosis with calcified plaque. 4. Stable moderate to severe 70% proximal left ICA stenosis with calcified plaque. 5. Stable mild less than 50% bilateral carotid siphon stenosis with calcified plaque. These results were called by telephone at the time of interpretation on 08/10/2017 at 12:11 am to Dr. Amie Portland , who verbally acknowledged these results. Electronically Signed   By: Kristine Garbe M.D.   On: 08/10/2017 00:13   Ct Angio Neck W Or Wo Contrast  Result Date: 08/10/2017 CLINICAL DATA:  82 y/o  M; left-sided weakness and facial droop. EXAM: CT ANGIOGRAPHY HEAD AND NECK TECHNIQUE: Multidetector CT imaging of the head and neck was performed using the standard protocol during bolus administration of intravenous contrast. Multiplanar CT image reconstructions and MIPs were obtained to evaluate the vascular anatomy. Carotid stenosis measurements (when applicable) are obtained utilizing NASCET criteria, using the distal internal carotid diameter as the denominator. CONTRAST:  54mL ISOVUE-370 IOPAMIDOL (ISOVUE-370) INJECTION 76%  COMPARISON:  03/21/2017 CTA of head and neck. 03/21/2017 MRI head. 08/09/2017 CT head. FINDINGS: CTA NECK FINDINGS Aortic arch: Three-vessel arch. Moderate calcific atherosclerosis. Partially visualized sternotomy, LIMA bypass, and saphenous grafts. Right carotid system: No evidence of dissection, stenosis (50% or greater) or occlusion. Calcified plaque of the right carotid bifurcation with mild less than 50% proximal ICA stenosis. Left carotid system: No evidence of dissection or  occlusion. Calcified plaque of left carotid bifurcation with moderate to severe 70% proximal ICA stenosis. Vertebral arteries: Calcified plaque of bilateral vertebral origin stenosis with mild-to-moderate stenosis. Right dominant system. No additional segment of stenosis, dissection, aneurysm, or occlusion. Skeleton: Mild reversal of cervical curvature and grade 1 anterolisthesis at the C5-6 level. Mild discogenic degenerative changes at C5-C7. No high-grade bony canal stenosis. Other neck: Negative. Upper chest: Negative. Review of the MIP images confirms the above findings CTA HEAD FINDINGS Anterior circulation: No significant stenosis, proximal occlusion, aneurysm, or vascular malformation. Calcified plaque of bilateral carotid siphons with mild less than 50% stenosis. Posterior circulation: No significant stenosis, proximal occlusion, aneurysm, or vascular malformation. Venous sinuses: As permitted by contrast timing, patent. Anatomic variants: Small anterior communicating artery. No posterior communicating artery identified, likely hypoplastic or absent. Review of the MIP images confirms the above findings IMPRESSION: 1. Patent carotid and vertebral arteries. 2. Patent anterior and posterior intracranial circulation. No large vessel occlusion, aneurysm, or high-grade stenosis. 3. Stable mild less than 50% proximal right ICA stenosis with calcified plaque. 4. Stable moderate to severe 70% proximal left ICA stenosis with calcified  plaque. 5. Stable mild less than 50% bilateral carotid siphon stenosis with calcified plaque. These results were called by telephone at the time of interpretation on 08/10/2017 at 12:11 am to Dr. Amie Portland , who verbally acknowledged these results. Electronically Signed   By: Kristine Garbe M.D.   On: 08/10/2017 00:13   Mr Brain Wo Contrast  Result Date: 08/10/2017 CLINICAL DATA:  82 y/o M; left-sided weakness and facial droop. History of stroke. EXAM: MRI HEAD WITHOUT CONTRAST TECHNIQUE: Multiplanar, multiecho pulse sequences of the brain and surrounding structures were obtained without intravenous contrast. COMPARISON:  08/09/2017 CT and CTA of the head. 03/21/2017 MRI head. FINDINGS: Brain: Subcentimeter focus of reduced diffusion within the right posterior limb of internal capsule (series 5 image 62, series 6, image 22, series 7, image 53) compatible with acute/early subacute infarction. No associated hemorrhage or mass effect. Stable patchy confluent nonspecific foci of T2 FLAIR hyperintense signal abnormality in subcortical and periventricular white matter are compatible with advanced chronic microvascular ischemic changes for age. Moderate brain parenchymal volume loss. No new susceptibility hypointensity to indicate interval intracranial hemorrhage. No structural abnormality of the brain. Hippocampi are symmetric in size and signal. No extra-axial collection, hydrocephalus, or effacement of basilar cisterns. Vascular: Normal flow voids. Skull and upper cervical spine: Normal marrow signal. Sinuses/Orbits: Negative. Other: None. IMPRESSION: 1. Subcentimeter acute/early subacute infarction within the right posterior limb of internal capsule. No associated hemorrhage or mass effect. 2. Stable advanced chronic microvascular ischemic changes and moderate parenchymal volume loss of the brain. These results were called by telephone at the time of interpretation on 08/10/2017 at 12:55 am to Dr. Amie Portland , who verbally acknowledged these results. Electronically Signed   By: Kristine Garbe M.D.   On: 08/10/2017 00:56   Ct Head Code Stroke Wo Contrast  Result Date: 08/09/2017 CLINICAL DATA:  Code stroke. 82 y/o M; left-sided weakness and facial droop. EXAM: CT HEAD WITHOUT CONTRAST TECHNIQUE: Contiguous axial images were obtained from the base of the skull through the vertex without intravenous contrast. COMPARISON:  03/22/2017 CT head.  03/21/2017 MRI head. FINDINGS: Brain: No evidence of acute infarction, hemorrhage, hydrocephalus, extra-axial collection or mass lesion/mass effect. Stable chronic microvascular ischemic changes and parenchymal volume loss of the brain. Vascular: Calcific atherosclerosis of carotid siphons. No hyperdense vessel identified. Skull: Normal. Negative for fracture or  focal lesion. Sinuses/Orbits: No acute finding. Other: Bilateral intra-ocular lens replacement. ASPECTS [facility] Stroke Program Early CT Score) - Ganglionic level infarction (caudate, lentiform nuclei, internal capsule, insula, M1-M3 cortex): 7 - Supraganglionic infarction (M4-M6 cortex): 3 Total score (0-10 with 10 being normal): 10 IMPRESSION: 1. No acute intracranial abnormality identified. 2. ASPECTS is 10 3. Stable chronic microvascular ischemic changes and parenchymal volume loss of the brain. These results were communicated to Dr. Rory Percy at 11:50 pmon 5/29/2019by text page via the [facility] messaging system. Electronically Signed   By: Kristine Garbe M.D.   On: 08/09/2017 23:51    Procedures Procedures (including critical care time)  Medications Ordered in ED Medications - No data to display   Initial Impression / Assessment and Plan / ED Course  I have reviewed the triage vital signs and the nursing notes.  Pertinent labs & imaging results that were available during my care of the patient were reviewed by me and considered in my medical decision making (see chart for details).      Patient with left-sided weakness and slurred speech, last seen normal 8 PM.  Code stroke activated on arrival. Not tPA candidate due to minimal and improving symptoms.  Discussion with Dr. Rory Percy of neurology.  Patient had similar presentation in January 2019 where he received TPA with a negative MRI.  There is some concern for complex partial seizures.  He had a negative CTA at that time.  Dr. Malen Gauze is recommending CTA as well as MRI to rule out acute stroke and large vessel occlusion.  CTA is negative for emergent large vessel occlusion.  MRI does show new acute infarct in the internal capsule. Dr. Rory Percy of neurology recommends medical admission. Patient still with subtle left-sided weakness on exam and denies pain.  Family updated.  Patient's Depakote level is subtherapeutic  Admission d/w Dr. Blaine Hamper.   CRITICAL CARE Performed by: Ezequiel Essex Total critical care time: 31 minutes Critical care time was exclusive of separately billable procedures and treating other patients. Critical care was necessary to treat or prevent imminent or life-threatening deterioration. Critical care was time spent personally by me on the following activities: development of treatment plan with patient and/or surrogate as well as nursing, discussions with consultants, evaluation of patient's response to treatment, examination of patient, obtaining history from patient or surrogate, ordering and performing treatments and interventions, ordering and review of laboratory studies, ordering and review of radiographic studies, pulse oximetry and re-evaluation of patient's condition.   Final Clinical Impressions(s) / ED Diagnoses   Final diagnoses:  Cerebral infarction, unspecified mechanism [facility])    ED Discharge Orders    None       Ezequiel Essex, MD 08/10/17 [phone]

## 2017-08-10 ENCOUNTER — Inpatient Hospital Stay (HOSPITAL_COMMUNITY): Payer: Medicare Other

## 2017-08-10 ENCOUNTER — Emergency Department (HOSPITAL_COMMUNITY): Payer: Medicare Other

## 2017-08-10 ENCOUNTER — Other Ambulatory Visit: Payer: Self-pay

## 2017-08-10 ENCOUNTER — Encounter (HOSPITAL_COMMUNITY): Payer: Self-pay | Admitting: Emergency Medicine

## 2017-08-10 DIAGNOSIS — E785 Hyperlipidemia, unspecified: Secondary | ICD-10-CM | POA: Diagnosis present

## 2017-08-10 DIAGNOSIS — I69391 Dysphagia following cerebral infarction: Secondary | ICD-10-CM | POA: Diagnosis not present

## 2017-08-10 DIAGNOSIS — G40209 Localization-related (focal) (partial) symptomatic epilepsy and epileptic syndromes with complex partial seizures, not intractable, without status epilepticus: Secondary | ICD-10-CM | POA: Diagnosis present

## 2017-08-10 DIAGNOSIS — D62 Acute posthemorrhagic anemia: Secondary | ICD-10-CM | POA: Diagnosis not present

## 2017-08-10 DIAGNOSIS — I739 Peripheral vascular disease, unspecified: Secondary | ICD-10-CM | POA: Diagnosis not present

## 2017-08-10 DIAGNOSIS — E118 Type 2 diabetes mellitus with unspecified complications: Secondary | ICD-10-CM

## 2017-08-10 DIAGNOSIS — Z9114 Patient's other noncompliance with medication regimen: Secondary | ICD-10-CM | POA: Diagnosis not present

## 2017-08-10 DIAGNOSIS — R1310 Dysphagia, unspecified: Secondary | ICD-10-CM | POA: Diagnosis present

## 2017-08-10 DIAGNOSIS — I5032 Chronic diastolic (congestive) heart failure: Secondary | ICD-10-CM | POA: Diagnosis not present

## 2017-08-10 DIAGNOSIS — G8194 Hemiplegia, unspecified affecting left nondominant side: Secondary | ICD-10-CM | POA: Diagnosis not present

## 2017-08-10 DIAGNOSIS — I2510 Atherosclerotic heart disease of native coronary artery without angina pectoris: Secondary | ICD-10-CM | POA: Diagnosis present

## 2017-08-10 DIAGNOSIS — I6389 Other cerebral infarction: Secondary | ICD-10-CM | POA: Diagnosis not present

## 2017-08-10 DIAGNOSIS — R29810 Facial weakness: Secondary | ICD-10-CM | POA: Diagnosis present

## 2017-08-10 DIAGNOSIS — F1010 Alcohol abuse, uncomplicated: Secondary | ICD-10-CM

## 2017-08-10 DIAGNOSIS — E1151 Type 2 diabetes mellitus with diabetic peripheral angiopathy without gangrene: Secondary | ICD-10-CM | POA: Diagnosis present

## 2017-08-10 DIAGNOSIS — I10 Essential (primary) hypertension: Secondary | ICD-10-CM | POA: Diagnosis not present

## 2017-08-10 DIAGNOSIS — Z888 Allergy status to other drugs, medicaments and biological substances status: Secondary | ICD-10-CM | POA: Diagnosis not present

## 2017-08-10 DIAGNOSIS — M170 Bilateral primary osteoarthritis of knee: Secondary | ICD-10-CM | POA: Diagnosis present

## 2017-08-10 DIAGNOSIS — I480 Paroxysmal atrial fibrillation: Secondary | ICD-10-CM | POA: Diagnosis present

## 2017-08-10 DIAGNOSIS — R471 Dysarthria and anarthria: Secondary | ICD-10-CM | POA: Diagnosis present

## 2017-08-10 DIAGNOSIS — I4891 Unspecified atrial fibrillation: Secondary | ICD-10-CM | POA: Diagnosis not present

## 2017-08-10 DIAGNOSIS — Z8673 Personal history of transient ischemic attack (TIA), and cerebral infarction without residual deficits: Secondary | ICD-10-CM | POA: Diagnosis present

## 2017-08-10 DIAGNOSIS — Z794 Long term (current) use of insulin: Secondary | ICD-10-CM | POA: Diagnosis not present

## 2017-08-10 DIAGNOSIS — I69321 Dysphasia following cerebral infarction: Secondary | ICD-10-CM | POA: Diagnosis not present

## 2017-08-10 DIAGNOSIS — I6300 Cerebral infarction due to thrombosis of unspecified precerebral artery: Secondary | ICD-10-CM | POA: Diagnosis not present

## 2017-08-10 DIAGNOSIS — Z951 Presence of aortocoronary bypass graft: Secondary | ICD-10-CM | POA: Diagnosis not present

## 2017-08-10 DIAGNOSIS — D649 Anemia, unspecified: Secondary | ICD-10-CM | POA: Diagnosis present

## 2017-08-10 DIAGNOSIS — I340 Nonrheumatic mitral (valve) insufficiency: Secondary | ICD-10-CM | POA: Diagnosis not present

## 2017-08-10 DIAGNOSIS — I639 Cerebral infarction, unspecified: Secondary | ICD-10-CM | POA: Diagnosis present

## 2017-08-10 DIAGNOSIS — I69322 Dysarthria following cerebral infarction: Secondary | ICD-10-CM | POA: Diagnosis not present

## 2017-08-10 DIAGNOSIS — I69354 Hemiplegia and hemiparesis following cerebral infarction affecting left non-dominant side: Secondary | ICD-10-CM | POA: Diagnosis not present

## 2017-08-10 DIAGNOSIS — E1169 Type 2 diabetes mellitus with other specified complication: Secondary | ICD-10-CM | POA: Diagnosis not present

## 2017-08-10 DIAGNOSIS — K5900 Constipation, unspecified: Secondary | ICD-10-CM | POA: Diagnosis present

## 2017-08-10 DIAGNOSIS — I110 Hypertensive heart disease with heart failure: Secondary | ICD-10-CM | POA: Diagnosis not present

## 2017-08-10 DIAGNOSIS — R4586 Emotional lability: Secondary | ICD-10-CM | POA: Diagnosis present

## 2017-08-10 DIAGNOSIS — R29701 NIHSS score 1: Secondary | ICD-10-CM | POA: Diagnosis present

## 2017-08-10 DIAGNOSIS — I5042 Chronic combined systolic (congestive) and diastolic (congestive) heart failure: Secondary | ICD-10-CM | POA: Diagnosis not present

## 2017-08-10 DIAGNOSIS — G40909 Epilepsy, unspecified, not intractable, without status epilepticus: Secondary | ICD-10-CM | POA: Diagnosis not present

## 2017-08-10 DIAGNOSIS — M1711 Unilateral primary osteoarthritis, right knee: Secondary | ICD-10-CM | POA: Diagnosis present

## 2017-08-10 DIAGNOSIS — E669 Obesity, unspecified: Secondary | ICD-10-CM | POA: Diagnosis not present

## 2017-08-10 DIAGNOSIS — E1142 Type 2 diabetes mellitus with diabetic polyneuropathy: Secondary | ICD-10-CM | POA: Diagnosis not present

## 2017-08-10 DIAGNOSIS — E1159 Type 2 diabetes mellitus with other circulatory complications: Secondary | ICD-10-CM | POA: Diagnosis present

## 2017-08-10 DIAGNOSIS — I130 Hypertensive heart and chronic kidney disease with heart failure and stage 1 through stage 4 chronic kidney disease, or unspecified chronic kidney disease: Secondary | ICD-10-CM | POA: Diagnosis present

## 2017-08-10 DIAGNOSIS — I5041 Acute combined systolic (congestive) and diastolic (congestive) heart failure: Secondary | ICD-10-CM | POA: Diagnosis not present

## 2017-08-10 DIAGNOSIS — N179 Acute kidney failure, unspecified: Secondary | ICD-10-CM | POA: Diagnosis present

## 2017-08-10 DIAGNOSIS — M109 Gout, unspecified: Secondary | ICD-10-CM | POA: Diagnosis present

## 2017-08-10 DIAGNOSIS — I6523 Occlusion and stenosis of bilateral carotid arteries: Secondary | ICD-10-CM | POA: Diagnosis not present

## 2017-08-10 LAB — HEMOGLOBIN A1C
Hgb A1c MFr Bld: 6 % — ABNORMAL HIGH (ref 4.8–5.6)
MEAN PLASMA GLUCOSE: 125.5 mg/dL

## 2017-08-10 LAB — URINALYSIS, ROUTINE W REFLEX MICROSCOPIC
BILIRUBIN URINE: NEGATIVE
Glucose, UA: NEGATIVE mg/dL
Hgb urine dipstick: NEGATIVE
KETONES UR: NEGATIVE mg/dL
Leukocytes, UA: NEGATIVE
NITRITE: NEGATIVE
PROTEIN: NEGATIVE mg/dL
Specific Gravity, Urine: 1.012 (ref 1.005–1.030)
pH: 6 (ref 5.0–8.0)

## 2017-08-10 LAB — COMPREHENSIVE METABOLIC PANEL
ALBUMIN: 3.9 g/dL (ref 3.5–5.0)
ALT: 22 U/L (ref 17–63)
AST: 22 U/L (ref 15–41)
Alkaline Phosphatase: 39 U/L (ref 38–126)
Anion gap: 11 (ref 5–15)
BUN: 27 mg/dL — ABNORMAL HIGH (ref 6–20)
CHLORIDE: 101 mmol/L (ref 101–111)
CO2: 26 mmol/L (ref 22–32)
Calcium: 9.7 mg/dL (ref 8.9–10.3)
Creatinine, Ser: 1.43 mg/dL — ABNORMAL HIGH (ref 0.61–1.24)
GFR calc Af Amer: 52 mL/min — ABNORMAL LOW (ref >60)
GFR calc non Af Amer: 45 mL/min — ABNORMAL LOW (ref >60)
GLUCOSE: 100 mg/dL — AB (ref 65–99)
POTASSIUM: 4.6 mmol/L (ref 3.5–5.1)
Sodium: 138 mmol/L (ref 135–145)
Total Bilirubin: 0.6 mg/dL (ref 0.3–1.2)
Total Protein: 7.2 g/dL (ref 6.5–8.1)

## 2017-08-10 LAB — ECHOCARDIOGRAM COMPLETE
Height: 63 in
WEIGHTICAEL: 2976 [oz_av]

## 2017-08-10 LAB — LIPID PANEL
CHOL/HDL RATIO: 4.9 ratio
Cholesterol: 195 mg/dL (ref 0–200)
HDL: 40 mg/dL — ABNORMAL LOW (ref >40)
LDL Cholesterol: 130 mg/dL — ABNORMAL HIGH (ref 0–99)
Triglycerides: 125 mg/dL (ref <150)
VLDL: 25 mg/dL (ref 0–40)

## 2017-08-10 LAB — VALPROIC ACID LEVEL: VALPROIC ACID LVL: 16 ug/mL — AB (ref 50.0–100.0)

## 2017-08-10 LAB — CBG MONITORING, ED
GLUCOSE-CAPILLARY: 130 mg/dL — AB (ref 65–99)
GLUCOSE-CAPILLARY: 167 mg/dL — AB (ref 65–99)
Glucose-Capillary: 140 mg/dL — ABNORMAL HIGH (ref 65–99)
Glucose-Capillary: 155 mg/dL — ABNORMAL HIGH (ref 65–99)

## 2017-08-10 LAB — RAPID URINE DRUG SCREEN, HOSP PERFORMED
AMPHETAMINES: NOT DETECTED
BARBITURATES: NOT DETECTED
Benzodiazepines: NOT DETECTED
Cocaine: NOT DETECTED
Opiates: NOT DETECTED
TETRAHYDROCANNABINOL: NOT DETECTED

## 2017-08-10 LAB — TSH: TSH: 2.804 u[IU]/mL (ref 0.350–4.500)

## 2017-08-10 LAB — ETHANOL

## 2017-08-10 LAB — TROPONIN I

## 2017-08-10 MED ORDER — ASPIRIN 325 MG PO TABS
325.0000 mg | ORAL_TABLET | Freq: Every day | ORAL | Status: DC
  Administered 2017-08-10: 325 mg via ORAL
  Filled 2017-08-10: qty 1

## 2017-08-10 MED ORDER — SODIUM CHLORIDE 0.9 % IV BOLUS
500.0000 mL | Freq: Once | INTRAVENOUS | Status: AC
  Administered 2017-08-10: 500 mL via INTRAVENOUS

## 2017-08-10 MED ORDER — HYDROCODONE-ACETAMINOPHEN 5-325 MG PO TABS: 1.0000 | ORAL_TABLET | ORAL | Status: DC | PRN

## 2017-08-10 MED ORDER — ALBUTEROL SULFATE (2.5 MG/3ML) 0.083% IN NEBU: 2.5000 mg | INHALATION_SOLUTION | Freq: Four times a day (QID) | RESPIRATORY_TRACT | Status: DC | PRN

## 2017-08-10 MED ORDER — COQ10 30 MG PO CAPS: 1.0000 | ORAL_CAPSULE | Freq: Every day | ORAL | Status: DC

## 2017-08-10 MED ORDER — ADULT MULTIVITAMIN W/MINERALS CH
1.0000 | ORAL_TABLET | Freq: Every day | ORAL | Status: DC
  Administered 2017-08-10 – 2017-08-11 (×2): 1 via ORAL
  Filled 2017-08-10 (×2): qty 1

## 2017-08-10 MED ORDER — SODIUM CHLORIDE 0.9 % IV SOLN
INTRAVENOUS | Status: DC
  Administered 2017-08-10 (×2): via INTRAVENOUS

## 2017-08-10 MED ORDER — ACETAMINOPHEN 160 MG/5ML PO SOLN: 650.0000 mg | ORAL | Status: DC | PRN

## 2017-08-10 MED ORDER — ENOXAPARIN SODIUM 40 MG/0.4ML ~~LOC~~ SOLN
40.0000 mg | SUBCUTANEOUS | Status: DC
  Administered 2017-08-10 – 2017-08-11 (×2): 40 mg via SUBCUTANEOUS
  Filled 2017-08-10 (×3): qty 0.4

## 2017-08-10 MED ORDER — LORAZEPAM 1 MG PO TABS: 1.0000 mg | ORAL_TABLET | Freq: Four times a day (QID) | ORAL | Status: DC | PRN

## 2017-08-10 MED ORDER — HYDRALAZINE HCL 20 MG/ML IJ SOLN: 5.0000 mg | INTRAMUSCULAR | Status: DC | PRN

## 2017-08-10 MED ORDER — LORAZEPAM 2 MG/ML IJ SOLN: 1.0000 mg | Freq: Four times a day (QID) | INTRAMUSCULAR | Status: DC | PRN

## 2017-08-10 MED ORDER — ASPIRIN EC 81 MG PO TBEC
81.0000 mg | DELAYED_RELEASE_TABLET | Freq: Every day | ORAL | Status: DC
  Administered 2017-08-11: 81 mg via ORAL
  Filled 2017-08-10: qty 1

## 2017-08-10 MED ORDER — INSULIN ASPART 100 UNIT/ML ~~LOC~~ SOLN
0.0000 [IU] | Freq: Three times a day (TID) | SUBCUTANEOUS | Status: DC
  Administered 2017-08-10: 1 [IU] via SUBCUTANEOUS
  Administered 2017-08-10 (×2): 2 [IU] via SUBCUTANEOUS
  Administered 2017-08-11 (×2): 3 [IU] via SUBCUTANEOUS
  Administered 2017-08-11: 1 [IU] via SUBCUTANEOUS
  Filled 2017-08-10 (×2): qty 1

## 2017-08-10 MED ORDER — INSULIN GLARGINE 100 UNIT/ML SOLOSTAR PEN: 10.0000 [IU] | PEN_INJECTOR | Freq: Every day | SUBCUTANEOUS | Status: DC

## 2017-08-10 MED ORDER — STROKE: EARLY STAGES OF RECOVERY BOOK
Freq: Once | Status: AC
  Administered 2017-08-10: 20:00:00
  Filled 2017-08-10: qty 1

## 2017-08-10 MED ORDER — CLOPIDOGREL BISULFATE 75 MG PO TABS
75.0000 mg | ORAL_TABLET | Freq: Every day | ORAL | Status: DC
  Administered 2017-08-10 – 2017-08-11 (×2): 75 mg via ORAL
  Filled 2017-08-10 (×2): qty 1

## 2017-08-10 MED ORDER — ONDANSETRON HCL 4 MG/2ML IJ SOLN: 4.0000 mg | Freq: Three times a day (TID) | INTRAMUSCULAR | Status: DC | PRN

## 2017-08-10 MED ORDER — INSULIN GLARGINE 100 UNIT/ML ~~LOC~~ SOLN
10.0000 [IU] | Freq: Every day | SUBCUTANEOUS | Status: DC
  Administered 2017-08-10: 10 [IU] via SUBCUTANEOUS
  Filled 2017-08-10 (×3): qty 0.1

## 2017-08-10 MED ORDER — HYDRALAZINE HCL 20 MG/ML IJ SOLN: 5.0000 mg | Freq: Four times a day (QID) | INTRAMUSCULAR | Status: DC | PRN

## 2017-08-10 MED ORDER — ZOLPIDEM TARTRATE 5 MG PO TABS: 5.0000 mg | ORAL_TABLET | Freq: Every evening | ORAL | Status: DC | PRN

## 2017-08-10 MED ORDER — ACETAMINOPHEN 325 MG PO TABS: 650.0000 mg | ORAL_TABLET | ORAL | Status: DC | PRN

## 2017-08-10 MED ORDER — DIVALPROEX SODIUM ER 500 MG PO TB24
500.0000 mg | ORAL_TABLET | Freq: Every day | ORAL | Status: DC
  Administered 2017-08-10 – 2017-08-11 (×2): 500 mg via ORAL
  Filled 2017-08-10 (×2): qty 1

## 2017-08-10 MED ORDER — ATORVASTATIN CALCIUM 80 MG PO TABS
80.0000 mg | ORAL_TABLET | Freq: Every day | ORAL | Status: DC
  Administered 2017-08-10 – 2017-08-11 (×2): 80 mg via ORAL
  Filled 2017-08-10 (×2): qty 1

## 2017-08-10 MED ORDER — THIAMINE HCL 100 MG/ML IJ SOLN
500.0000 mg | Freq: Three times a day (TID) | INTRAVENOUS | Status: DC
  Administered 2017-08-10 – 2017-08-11 (×3): 500 mg via INTRAVENOUS
  Filled 2017-08-10 (×8): qty 5

## 2017-08-10 MED ORDER — FOLIC ACID 1 MG PO TABS
1.0000 mg | ORAL_TABLET | Freq: Every day | ORAL | Status: DC
  Administered 2017-08-10 – 2017-08-11 (×2): 1 mg via ORAL
  Filled 2017-08-10 (×2): qty 1

## 2017-08-10 MED ORDER — ACETAMINOPHEN 650 MG RE SUPP: 650.0000 mg | RECTAL | Status: DC | PRN

## 2017-08-10 MED ORDER — NIACIN ER (ANTIHYPERLIPIDEMIC) 500 MG PO TBCR
500.0000 mg | EXTENDED_RELEASE_TABLET | Freq: Every day | ORAL | Status: DC
  Filled 2017-08-10 (×2): qty 1

## 2017-08-10 MED ORDER — ALLOPURINOL 100 MG PO TABS
300.0000 mg | ORAL_TABLET | Freq: Every day | ORAL | Status: DC
  Administered 2017-08-10 – 2017-08-11 (×2): 300 mg via ORAL
  Filled 2017-08-10: qty 3
  Filled 2017-08-10: qty 1

## 2017-08-10 NOTE — ED Notes (Signed)
Pt CBG 130. Notified Gerald Stabs, Therapist, sports.

## 2017-08-10 NOTE — Progress Notes (Addendum)
STROKE TEAM PROGRESS NOTE   INTERVAL HISTORY Notified by attending just after 11 AM the patient was found to have worsening left hemiparesis.  Daughter at bedside states that over the course of the past 3 hours that she has been here patient has had slowly worsening of left hemiparesis and increasing slurred speech. Patient working with physical therapy just prior to my arrival.  Given worsening, will keep flat and 2 AM and give a saline bolus.  Waxing and waning following a lacunar stroke is common.    Vitals:   08/10/17 1200 08/10/17 1300 08/10/17 1400 08/10/17 1500  BP: (!) 170/73 (!) 174/61 129/63 (!) 169/78  Pulse: 80 71 83 89  Resp: 18 17    Temp:      TempSrc:      SpO2: 97% 97% 94% 97%  Weight:      Height:        CBC:  Recent Labs  Lab 08/09/17 2328 08/09/17 2334  WBC 7.6  --   NEUTROABS 3.2  --   HGB 14.3 15.0  HCT 42.4 44.0  MCV 93.8  --   PLT 253  --     Basic Metabolic Panel:  Recent Labs  Lab 08/09/17 2328 08/09/17 2334  NA 138 140  K 4.6 4.6  CL 101 100*  CO2 26  --   GLUCOSE 100* 99  BUN 27* 30*  CREATININE 1.43* 1.30*  CALCIUM 9.7  --    Lipid Panel:     Component Value Date/Time   CHOL 195 08/10/2017 0334   TRIG 125 08/10/2017 0334   HDL 40 (L) 08/10/2017 0334   CHOLHDL 4.9 08/10/2017 0334   VLDL 25 08/10/2017 0334   LDLCALC 130 (H) 08/10/2017 0334   HgbA1c:  Lab Results  Component Value Date   HGBA1C 6.0 (H) 08/10/2017   Urine Drug Screen:     Component Value Date/Time   LABOPIA NONE DETECTED 08/10/2017 0001   COCAINSCRNUR NONE DETECTED 08/10/2017 0001   LABBENZ NONE DETECTED 08/10/2017 0001   AMPHETMU NONE DETECTED 08/10/2017 0001   THCU NONE DETECTED 08/10/2017 0001   LABBARB NONE DETECTED 08/10/2017 0001    Alcohol Level     Component Value Date/Time   ETH <10 08/09/2017 2328    IMAGING Ct Angio Head W Or Wo Contrast  Result Date: 08/10/2017 CLINICAL DATA:  82 y/o  M; left-sided weakness and facial droop. EXAM: CT  ANGIOGRAPHY HEAD AND NECK TECHNIQUE: Multidetector CT imaging of the head and neck was performed using the standard protocol during bolus administration of intravenous contrast. Multiplanar CT image reconstructions and MIPs were obtained to evaluate the vascular anatomy. Carotid stenosis measurements (when applicable) are obtained utilizing NASCET criteria, using the distal internal carotid diameter as the denominator. CONTRAST:  31mL ISOVUE-370 IOPAMIDOL (ISOVUE-370) INJECTION 76% COMPARISON:  03/21/2017 CTA of head and neck. 03/21/2017 MRI head. 08/09/2017 CT head. FINDINGS: CTA NECK FINDINGS Aortic arch: Three-vessel arch. Moderate calcific atherosclerosis. Partially visualized sternotomy, LIMA bypass, and saphenous grafts. Right carotid system: No evidence of dissection, stenosis (50% or greater) or occlusion. Calcified plaque of the right carotid bifurcation with mild less than 50% proximal ICA stenosis. Left carotid system: No evidence of dissection or occlusion. Calcified plaque of left carotid bifurcation with moderate to severe 70% proximal ICA stenosis. Vertebral arteries: Calcified plaque of bilateral vertebral origin stenosis with mild-to-moderate stenosis. Right dominant system. No additional segment of stenosis, dissection, aneurysm, or occlusion. Skeleton: Mild reversal of cervical curvature and grade 1 anterolisthesis  at the C5-6 level. Mild discogenic degenerative changes at C5-C7. No high-grade bony canal stenosis. Other neck: Negative. Upper chest: Negative. Review of the MIP images confirms the above findings CTA HEAD FINDINGS Anterior circulation: No significant stenosis, proximal occlusion, aneurysm, or vascular malformation. Calcified plaque of bilateral carotid siphons with mild less than 50% stenosis. Posterior circulation: No significant stenosis, proximal occlusion, aneurysm, or vascular malformation. Venous sinuses: As permitted by contrast timing, patent. Anatomic variants: Small  anterior communicating artery. No posterior communicating artery identified, likely hypoplastic or absent. Review of the MIP images confirms the above findings IMPRESSION: 1. Patent carotid and vertebral arteries. 2. Patent anterior and posterior intracranial circulation. No large vessel occlusion, aneurysm, or high-grade stenosis. 3. Stable mild less than 50% proximal right ICA stenosis with calcified plaque. 4. Stable moderate to severe 70% proximal left ICA stenosis with calcified plaque. 5. Stable mild less than 50% bilateral carotid siphon stenosis with calcified plaque. These results were called by telephone at the time of interpretation on 08/10/2017 at 12:11 am to Dr. Amie Portland , who verbally acknowledged these results. Electronically Signed   By: Kristine Garbe M.D.   On: 08/10/2017 00:13   Ct Angio Neck W Or Wo Contrast  Result Date: 08/10/2017 CLINICAL DATA:  82 y/o  M; left-sided weakness and facial droop. EXAM: CT ANGIOGRAPHY HEAD AND NECK TECHNIQUE: Multidetector CT imaging of the head and neck was performed using the standard protocol during bolus administration of intravenous contrast. Multiplanar CT image reconstructions and MIPs were obtained to evaluate the vascular anatomy. Carotid stenosis measurements (when applicable) are obtained utilizing NASCET criteria, using the distal internal carotid diameter as the denominator. CONTRAST:  72mL ISOVUE-370 IOPAMIDOL (ISOVUE-370) INJECTION 76% COMPARISON:  03/21/2017 CTA of head and neck. 03/21/2017 MRI head. 08/09/2017 CT head. FINDINGS: CTA NECK FINDINGS Aortic arch: Three-vessel arch. Moderate calcific atherosclerosis. Partially visualized sternotomy, LIMA bypass, and saphenous grafts. Right carotid system: No evidence of dissection, stenosis (50% or greater) or occlusion. Calcified plaque of the right carotid bifurcation with mild less than 50% proximal ICA stenosis. Left carotid system: No evidence of dissection or occlusion.  Calcified plaque of left carotid bifurcation with moderate to severe 70% proximal ICA stenosis. Vertebral arteries: Calcified plaque of bilateral vertebral origin stenosis with mild-to-moderate stenosis. Right dominant system. No additional segment of stenosis, dissection, aneurysm, or occlusion. Skeleton: Mild reversal of cervical curvature and grade 1 anterolisthesis at the C5-6 level. Mild discogenic degenerative changes at C5-C7. No high-grade bony canal stenosis. Other neck: Negative. Upper chest: Negative. Review of the MIP images confirms the above findings CTA HEAD FINDINGS Anterior circulation: No significant stenosis, proximal occlusion, aneurysm, or vascular malformation. Calcified plaque of bilateral carotid siphons with mild less than 50% stenosis. Posterior circulation: No significant stenosis, proximal occlusion, aneurysm, or vascular malformation. Venous sinuses: As permitted by contrast timing, patent. Anatomic variants: Small anterior communicating artery. No posterior communicating artery identified, likely hypoplastic or absent. Review of the MIP images confirms the above findings IMPRESSION: 1. Patent carotid and vertebral arteries. 2. Patent anterior and posterior intracranial circulation. No large vessel occlusion, aneurysm, or high-grade stenosis. 3. Stable mild less than 50% proximal right ICA stenosis with calcified plaque. 4. Stable moderate to severe 70% proximal left ICA stenosis with calcified plaque. 5. Stable mild less than 50% bilateral carotid siphon stenosis with calcified plaque. These results were called by telephone at the time of interpretation on 08/10/2017 at 12:11 am to Dr. Amie Portland , who verbally acknowledged these results. Electronically Signed  By: Kristine Garbe M.D.   On: 08/10/2017 00:13   Mr Brain Wo Contrast  Result Date: 08/10/2017 CLINICAL DATA:  83 y/o M; left-sided weakness and facial droop. History of stroke. EXAM: MRI HEAD WITHOUT CONTRAST  TECHNIQUE: Multiplanar, multiecho pulse sequences of the brain and surrounding structures were obtained without intravenous contrast. COMPARISON:  08/09/2017 CT and CTA of the head. 03/21/2017 MRI head. FINDINGS: Brain: Subcentimeter focus of reduced diffusion within the right posterior limb of internal capsule (series 5 image 62, series 6, image 22, series 7, image 53) compatible with acute/early subacute infarction. No associated hemorrhage or mass effect. Stable patchy confluent nonspecific foci of T2 FLAIR hyperintense signal abnormality in subcortical and periventricular white matter are compatible with advanced chronic microvascular ischemic changes for age. Moderate brain parenchymal volume loss. No new susceptibility hypointensity to indicate interval intracranial hemorrhage. No structural abnormality of the brain. Hippocampi are symmetric in size and signal. No extra-axial collection, hydrocephalus, or effacement of basilar cisterns. Vascular: Normal flow voids. Skull and upper cervical spine: Normal marrow signal. Sinuses/Orbits: Negative. Other: None. IMPRESSION: 1. Subcentimeter acute/early subacute infarction within the right posterior limb of internal capsule. No associated hemorrhage or mass effect. 2. Stable advanced chronic microvascular ischemic changes and moderate parenchymal volume loss of the brain. These results were called by telephone at the time of interpretation on 08/10/2017 at 12:55 am to Dr. Amie Portland , who verbally acknowledged these results. Electronically Signed   By: Kristine Garbe M.D.   On: 08/10/2017 00:56   Ct Head Code Stroke Wo Contrast  Result Date: 08/09/2017 CLINICAL DATA:  Code stroke. 82 y/o M; left-sided weakness and facial droop. EXAM: CT HEAD WITHOUT CONTRAST TECHNIQUE: Contiguous axial images were obtained from the base of the skull through the vertex without intravenous contrast. COMPARISON:  03/22/2017 CT head.  03/21/2017 MRI head. FINDINGS: Brain:  No evidence of acute infarction, hemorrhage, hydrocephalus, extra-axial collection or mass lesion/mass effect. Stable chronic microvascular ischemic changes and parenchymal volume loss of the brain. Vascular: Calcific atherosclerosis of carotid siphons. No hyperdense vessel identified. Skull: Normal. Negative for fracture or focal lesion. Sinuses/Orbits: No acute finding. Other: Bilateral intra-ocular lens replacement. ASPECTS [facility] Stroke Program Early CT Score) - Ganglionic level infarction (caudate, lentiform nuclei, internal capsule, insula, M1-M3 cortex): 7 - Supraganglionic infarction (M4-M6 cortex): 3 Total score (0-10 with 10 being normal): 10 IMPRESSION: 1. No acute intracranial abnormality identified. 2. ASPECTS is 10 3. Stable chronic microvascular ischemic changes and parenchymal volume loss of the brain. These results were communicated to Dr. Rory Percy at 11:50 pmon 5/29/2019by text page via the [facility] messaging system. Electronically Signed   By: Kristine Garbe M.D.   On: 08/09/2017 23:51   2D Echocardiogram  pending   PHYSICAL EXAM GENERAL: Awake, alert in NAD HEENT: - Normocephalic and atraumatic, dry mm, no LN++, no Thyromegally LUNGS - Clear to auscultation bilaterally with no wheezes CV - S1S2 RRR, no m/r/g, equal pulses bilaterally. Ext: warm, well perfused, intact peripheral pulses, no edema  NEURO:  Mental Status: AA&Ox3  Language: speech is mildly dysarthric.  Naming, repetition, fluency, and comprehension intact. Cranial Nerves: PERRL. EOMI, visual fields full, L facial weakness, facial sensation intact, hearing intact, tongue/uvula/soft palate midline, normal sternocleidomastoid and trapezius muscle strength. No evidence of tongue atrophy or fibrillations Motor: L arm and leg drift. LEU 3/5 progressed to 4/5 and LLE 4/5. Right side 5/5 Tone: is normal and bulk is normal Sensation- Intact to light touch bilaterally Coordination: FTN intact bilaterally Gait-  deferred   ASSESSMENT/PLAN [name] is a 82 y.o. male with history of stroke, hypertension, hyperlipidemia, diabetes, gout, atrial fibrillation not on anticoagulation, coronary artery disease, CABG, CHF, seizure, peripheral vascular disease presenting with slurred speech and left facial droop.   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 Small vessel disease. Atrophy. ASPECTS 10.     CTA head & neck no ELVO.  Stenosis:  R ICA < 50%. L ICA 70 %. B ICA siphon < 50%.  MRI  R PLIC infarct. Advanced small vessel disease. Atrophy.   2D Echo  pending   LDL 130  HgbA1c 6.0  Lovenox 40 mg sq daily for VTE prophylaxis Diet Order           Diet heart healthy/carb modified Room service appropriate? Yes; Fluid consistency: Thin  Diet effective now           aspirin 325 mg daily prior to admission, now on aspirin 325 mg daily. Given TIA mild stroke, will place on aspirin 81 mg and plavix 75 mg daily x 3 weeks, then aspirin alone. Orders adjusted.   Therapy recommendations:  CIR. Consult in place  Disposition:  pending   CABG r/t Atrial Fibrillation  Home anticoagulation:  none   Operative, post-operative AF is not indication for long-term AC  Current stroke is not embolic but due to atrial fibrillation    Hypertension  140-160s . Permissive hypertension (OK if < 220/120) but gradually normalize in 5-7 days . Long-term BP goal normotensive  Hyperlipidemia  Home meds:  lipitor 40 and zetia  lipitor increased to 80 in hospital  LDL 130, goal < 70  Continue statin at discharge  Diabetes type II Diabetic polyneuropathy  HgbA1c 6.0, goal < 7.0  Controlled  Other Stroke Risk Factors  Advanced age  Former Cigarette smoker, quit 40 years ago  ETOH abuse, increased drinking frequency  due to wife's diagnosis of Lewy body dementia, advised to drink no more than 2 drink(s) a day  Obesity, Body mass index is 32.95 kg/m., recommend weight loss, diet and exercise as appropriate   Hx stroke  03/2017 -  Stroke like episode treated with IV TPA with full recovery but no infarct seen on MRI - garbled speech and confusion - EEG neg - sz vs TIA. Put on depakote.  08/2014 - R posterior frontal lobe infarct d/t small vessel disease no residual deficits  Family hx stroke (mother)  Coronary artery disease s/p CABG  PVD  Other Active Problems  Possible complex seizure disorder - on depakote - see stroke hx details 03/2017  Hospital day # 0  Burnetta Sabin, MSN, APRN, ANVP-BC, AGPCNP-BC Advanced Practice Stroke Nurse [place] for Schedule & Pager information 08/10/2017 3:49 PM  I have personally examined this patient, reviewed notes, independently viewed imaging studies, participated in medical decision making and plan of care.ROS completed by me personally and pertinent positives fully documented  I have made any additions or clarifications directly to the above note. Agree with note above.  Patient has presented withdysarthria and left hemiparesis and seems to have shown a neurological worsening. Recommend rest in bed today, IV hydration and dual antiplatelet therapy. Continue ongoing stroke workup.Aggressive risk factor modification. Long discussion at the bedside with the patient and his 2 daughters  and one granddaughter and answered questions. Greater than 50% time during this 35 minute visit was spent on counseling and coordination of care about his lacunar infarct and answering questions  Antony Contras, MD Medical Director [place] Pager: [phone] 08/10/2017 5:39 PM  To contact Stroke Continuity provider, please refer to [email or web address] After hours, contact General Neurology

## 2017-08-10 NOTE — ED Notes (Signed)
Dr. [name] paged to notify of worsening symptoms.

## 2017-08-10 NOTE — Progress Notes (Signed)
  Echocardiogram 2D Echocardiogram has been performed.  Fred [name] L Androw 08/10/2017, 3:35 PM

## 2017-08-10 NOTE — ED Notes (Signed)
Pt has drift of left arm. Drift left leg

## 2017-08-10 NOTE — Evaluation (Signed)
Physical Therapy Evaluation Patient Details Name: Fred [name] MRN: [id number] DOB: [birth year] Today's Date: 08/10/2017   History of Present Illness  Pt is an 82 y/o male admitted secondary to L sided weakness. Imaging revealed acute/early subacute infarct in the R posterior limb of the internal capsule. PMH includes CAD s/p CABG, TIA, HTN, a fib, CHF, and DM.   Clinical Impression  Pt admitted secondary to problem above with deficits below. Pt presenting with LUE/LLE weakness secondary to CVA. Required max A for basic bed mobility tasks. Pt presenting with L lateral lean in sitting and required min to mod A to maintain upright posture. Per pt and family, pt previously very independent and active at home. Feel pt would be an excellent candidate for CIR given current deficits, as pt very motivated to regain independence and has good family support. Will continue to follow acutely to maximize functional mobility independence and safety.     Follow Up Recommendations CIR;Supervision for mobility/OOB    Equipment Recommendations  None recommended by PT    Recommendations for Other Services Rehab consult     Precautions / Restrictions Precautions Precautions: Fall Restrictions Weight Bearing Restrictions: No      Mobility  Bed Mobility Overal bed mobility: Needs Assistance Bed Mobility: Supine to Sit;Sit to Supine     Supine to sit: Max assist Sit to supine: Max assist   General bed mobility comments: Pt requiring max A for assist with moving LEs and trunk elevation to come up to sitting. Increased L lateral lean in sitting and required from min to mod A to maintain balance. Max A to return to supine.   Transfers                 General transfer comment: Deferred   Ambulation/Gait                Stairs            Wheelchair Mobility    Modified Rankin (Stroke Patients Only) Modified Rankin (Stroke Patients Only) Pre-Morbid Rankin Score: No  symptoms Modified Rankin: Moderately severe disability     Balance Overall balance assessment: Needs assistance Sitting-balance support: Bilateral upper extremity supported;Feet supported Sitting balance-Leahy Scale: Poor Sitting balance - Comments: L lateral lean in sitting. Required min to mod A for sitting balance.                                      Pertinent Vitals/Pain Pain Assessment: No/denies pain    Home Living Family/patient expects to be discharged to:: Private residence Living Arrangements: Spouse/significant other Available Help at Discharge: Family;Available 24 hours/day Type of Home: House Home Access: Stairs to enter Entrance Stairs-Rails: None Entrance Stairs-Number of Steps: 2 Home Layout: Two level;Able to live on main level with bedroom/bathroom Home Equipment: Cane - quad;Bedside commode;Grab bars - tub/shower;Hand held shower head;Tub bench      Prior Function Level of Independence: Independent         Comments: Was still very active at home, performing ADLs, IADLs, chopping wood, etc.      Hand Dominance        Extremity/Trunk Assessment   Upper Extremity Assessment Upper Extremity Assessment: Defer to OT evaluation(notable LUE weakness )    Lower Extremity Assessment Lower Extremity Assessment: LLE deficits/detail LLE Deficits / Details: LLE grossly 2/5. Family reports weakness is worse; notified RN.     Cervical /  Trunk Assessment Cervical / Trunk Assessment: Other exceptions Cervical / Trunk Exceptions: L lateral lean in sitting.   Communication   Communication: HOH;Other (comment)(slurred speech )  Cognition Arousal/Alertness: Awake/alert Behavior During Therapy: WFL for tasks assessed/performed Overall Cognitive Status: Within Functional Limits for tasks assessed                                        General Comments General comments (skin integrity, edema, etc.): Pts daughter and grandaughter  present during session.     Exercises     Assessment/Plan    PT Assessment Patient needs continued PT services  PT Problem List Decreased strength;Decreased balance;Decreased mobility;Decreased knowledge of use of DME;Decreased knowledge of precautions       PT Treatment Interventions DME instruction;Gait training;Functional mobility training;Therapeutic activities;Therapeutic exercise;Balance training;Neuromuscular re-education;Patient/family education    PT Goals (Current goals can be found in the Care Plan section)  Acute Rehab PT Goals Patient Stated Goal: to be independent PT Goal Formulation: With patient/family Time For Goal Achievement: 08/24/17 Potential to Achieve Goals: Good    Frequency Min 4X/week   Barriers to discharge        Co-evaluation               AM-PAC PT "6 Clicks" Daily Activity  Outcome Measure Difficulty turning over in bed (including adjusting bedclothes, sheets and blankets)?: Unable Difficulty moving from lying on back to sitting on the side of the bed? : Unable Difficulty sitting down on and standing up from a chair with arms (e.g., wheelchair, bedside commode, etc,.)?: Unable Help needed moving to and from a bed to chair (including a wheelchair)?: Total Help needed walking in hospital room?: Total Help needed climbing 3-5 steps with a railing? : Total 6 Click Score: 6    End of Session   Activity Tolerance: Patient tolerated treatment well Patient left: in bed;with call bell/phone within reach;with family/visitor present Nurse Communication: Mobility status PT Visit Diagnosis: Muscle weakness (generalized) (M62.81);Hemiplegia and hemiparesis;Difficulty in walking, not elsewhere classified (R26.2) Hemiplegia - Right/Left: Left Hemiplegia - caused by: Cerebral infarction    Time: 0737-1062 PT Time Calculation (min) (ACUTE ONLY): 36 min   Charges:   PT Evaluation $PT Eval Moderate Complexity: 1 Mod PT Treatments $Therapeutic  Activity: 8-22 mins   PT G Codes:        Leighton Ruff, PT, DPT  Acute Rehabilitation Services  Pager: [phone]   Rudean Hitt 08/10/2017, 2:40 PM

## 2017-08-10 NOTE — ED Triage Notes (Signed)
Pt's family state family has a hx of TIA in the past. Tonight at 2000 noted slurred speech and pt was suddenly too weak to get up to walk to the restroom. Slid to floor. No injury or frank fall.

## 2017-08-10 NOTE — ED Notes (Signed)
Peri care performed; pt denies pain.

## 2017-08-10 NOTE — Progress Notes (Signed)
Patient arrived to [phone] from ED at this time. Safety precautions and orders reviewed with patient/family. TELE applied and confirmed. VSS. Patient appears to be coughing with sip of water excessively. RN advise pt to be NPO at this time until speech eval in the AM. Family agreed. Will continue to monitor.   Ave Filter, RN

## 2017-08-10 NOTE — Care Management (Signed)
This is a no charge note  Pending admission per Dr. Wyvonnia Dusky  82 year old man with past medical history of stroke, hypertension, hyperlipidemia, diabetes mellitus, gout, atrial fibrillation on not on anticoagulants, CAD, CABG, CHF, seizure, PVD, who presents with slurred speech and left facial droop.  MRI showed stroke. CTA with no LVO. Neurology, Dr. Rory Percy was consulted.  Patient is placed on telemetry bed for observation.  Admission order was put in using stroke order sets.     Ivor Costa, MD  [facility] Pager [phone]  If 7PM-7AM, please contact night-coverage [email or web address] Password [email or web address] 08/10/2017, 2:14 AM

## 2017-08-10 NOTE — H&P (Addendum)
History and Physical    Fred [name]:[id number] DOB: Apr 02, 1935 DOA: 08/09/2017   PCP: Fred Marry, MD   Patient coming from:  Home    Chief Complaint: Left facial droop, left-sided weakness, slurred speech.  HPI: Fred [name] is a 82 y.o. male with medical history significant for complex partial seizure noncompliant to his medication, history of stroke in 2016, and in January 2019, at the time with no residual deficits, history of atrial fibrillation postoperative during CABG, not on anticoagulation, CAD, history of heart failure, diabetes with neuropathy, history of syncope hypertension, hyperlipidemia, and recent binge drinking due to personal stress, presenting with left-sided weakness, with difficulty getting up from the chair around 8:15 PM last night.  The daughter also noted the patient having left sided facial droop, with dysarthria.  He has been somewhat dizzy.  No head trauma no confusion was reported.  The daughter denies witnessing the patient having seizure.  She also noted that his appetite has been poor, and no preceding illnesses.  He denies any pain.  He denies any nausea or vomiting.  He denies any chest pain or palpitations, shortness of breath or cough.  He denies any tobacco, alcohol or recreational drug use.  He is not on any hormonal supplements. He denies any leg swelling or calf pain.  He does not take an aspirin a day.  Daughter reports that the symptoms are similar to those of January 2019 when he received TPA, but had negative MRI.   ED Course:  BP (!) 143/91   Pulse 75   Temp 98.1 F (36.7 C) (Oral)   Resp 15   Ht 5\' 3"  (1.6 m)   Wt 84.4 kg (186 lb)   SpO2 95%   BMI 32.95 kg/m   Lipid panel shows LDL of 130, total cholesterol 195. Hemoglobin A1c 6  Urinalysis negative UDS negative Alcohol level pending EKG normal sinus rhythm, T wave inversions 3, with aVF, QTC normal CT Angio of the head and neck, negative for large vessel occlusion,  aneurysm, or high-grade stenosis.  Stable mild proximal right ICA stenosis, with stable moderate to severe 70% proximal left ICA stenosis. MRI of the brain shows subcentimeter acute-early subacute infarction within the right posterior limb of internal capsule, no hemorrhage. Neurology is on board,appreciate involvement Depakote level is supratherapeutic, adjustments are being made by neurology.  Review of Systems:  As per HPI otherwise all other systems reviewed and are negative  Past Medical History:  Diagnosis Date  . Atrial fibrillation ([place])    Post operative  . CAD (coronary artery disease)    s/p cabg  . CHF (congestive heart failure) ([place])   . Diverticulosis   . DJD (degenerative joint disease)   . DM (diabetes mellitus) ([place])   . History of anemia   . History of stroke   . Hyperlipidemia   . Hypertension   . 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 Peripheral vascular disease ([place])   . Polyneuropathy, diabetic ([place])   . Seizure disorder, complex partial ([place])   . Syncope and collapse     Past Surgical History:  Procedure Laterality Date  . CATARACT EXTRACTION W/ INTRAOCULAR LENS  IMPLANT, BILATERAL  06/2013   Dr. [name]  . CORONARY ARTERY BYPASS GRAFT  02/13/2004   4 vesel  . removal of sebaceous cyst from neck  08/2007   Dr. [name]  . TONSILLECTOMY      Social History Social History  Socioeconomic History  . Marital status: Married    Spouse name: Fred [name]  . Number of children: 3  . Years of education: Not on file  . Highest education level: Not on file  Occupational History  . 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 Financial resource strain: Not on file  . Food insecurity:    Worry: Not on file    Inability: Not on file  . Transportation needs:    Medical: Not on file    Non-medical: Not on file  Tobacco Use  . Smoking status: Former Smoker    Packs/day:  1.00    Years: 22.00    Pack years: 22.00    Types: Cigarettes    Last attempt to quit: 03/14/1977    Years since quitting: 40.4  . Smokeless tobacco: Never Used  Substance and Sexual Activity  . Alcohol use: No  . Drug use: No  . Sexual activity: Never  Lifestyle  . Physical activity:    Days per week: Not on file    Minutes per session: Not on file  . Stress: Not on file  Relationships  . Social connections:    Talks on phone: Not on file    Gets together: Not on file    Attends religious service: Not on file    Active member of club or organization: Not on file    Attends meetings of clubs or organizations: Not on file    Relationship status: Not on file  . Intimate partner violence:    Fear of current or ex partner: Not on file    Emotionally abused: Not on file    Physically abused: Not on file    Forced sexual activity: Not on file  Other Topics Concern  . Not on file  Social History Narrative   3 caffeinated drinks per day. Mostly coffee. He walks 15 minutes 3 days per week. Quit smoking in 1978.      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      Allergies  Allergen Reactions  . Lisinopril Cough    Family History  Problem Relation Age of Onset  . Epilepsy Father   . Stroke Mother   . Lung cancer Unknown       Prior to Admission medications   Medication Sig Start Date End Date Taking? Authorizing Provider  allopurinol (ZYLOPRIM) 300 MG tablet Take 1 tablet (300 mg total) by mouth daily. 08/02/16  Yes Fred Marry, MD  aspirin 325 MG tablet Take 325 mg by mouth daily.   Yes [provider]  atorvastatin (LIPITOR) 40 MG tablet Take 1 tablet (40 mg total) by mouth daily. 08/02/16  Yes Fred Marry, MD  Coenzyme Q10 (COQ10) 30 MG CAPS Take 1 tablet by mouth daily.     Yes [provider]  divalproex (DEPAKOTE ER) 500 MG 24 hr tablet Take 1  tablet (500 mg total) by mouth daily. 06/07/17  Yes Cameron Sprang, MD  ezetimibe (ZETIA) 10 MG tablet Take 1 tablet (10 mg total) by mouth daily. 11/09/16  Yes Fred Marry, MD  furosemide (LASIX) 40 MG tablet TAKE 1 TABLET BY MOUTH EVERY DAY AS NEEDED FOR FLUID RETENTION Patient taking differently: TAKE 1 TABLET (40mg ) BY MOUTH EVERY DAY AS NEEDED FOR FLUID RETENTION  10/15/15  Yes Fred Marry, MD  glimepiride (AMARYL) 4 MG tablet Take 1 tablet (4 mg total) by mouth daily with breakfast. 05/11/17  Yes Fred Marry, MD  Insulin Glargine (LANTUS SOLOSTAR) 100 UNIT/ML Solostar Pen Inject 10-20 Units into the skin daily at 10 pm. Patient taking differently: Inject 10 Units into the skin daily at 10 pm.  08/02/16  Yes Fred Marry, MD  ipratropium (ATROVENT) 0.03 % nasal spray USE 2 SPRAYS IN EACH NOSTRIL EVERY 12 HOURS 02/24/16  Yes Fred Marry, MD  losartan (COZAAR) 25 MG tablet Take 1 tablet (25 mg total) by mouth daily. 08/02/16  Yes Fred Marry, MD  metFORMIN (GLUCOPHAGE) 1000 MG tablet Take 1 tablet (1,000 mg total) by mouth 2 (two) times daily with a meal. 08/02/16  Yes Fred Marry, MD  metoprolol succinate (TOPROL-XL) 50 MG 24 hr tablet TAKE 1 TABLET BY MOUTH EVERY DAY Patient taking differently: TAKE 1 TABLET (50mg ) BY MOUTH EVERY DAY 01/12/15  Yes Fred Marry, MD  niacin (NIASPAN) 500 MG CR tablet Take 1 tablet (500 mg total) by mouth at bedtime. 08/02/16  Yes Fred Marry, MD  sitaGLIPtin (JANUVIA) 100 MG tablet Take 100 mg by mouth daily.   Yes [provider]  celecoxib (CELEBREX) 200 MG capsule One to 2 tablets by mouth daily as needed for pain. Patient not taking: Reported on 08/09/2017 06/08/17   Silverio Decamp, MD  Hyaluronan (ORTHOVISC) 30 MG/2ML SOSY Inject 1 Syringe into the articular space once a week. Patient not taking: Reported on 08/09/2017 06/13/17   Silverio Decamp, MD    linagliptin (TRADJENTA) 5 MG TABS tablet Take 1 tablet (5 mg total) by mouth daily. insuranc would not cover [place] Patient not taking: Reported on 08/09/2017 04/07/17   Fred Marry, MD    Physical Exam:  Vitals:   08/10/17 0630 08/10/17 0700 08/10/17 0730 08/10/17 0745  BP: 130/62 (!) 145/66 123/63 (!) 143/91  Pulse: 68 68 76 75  Resp: 13 14 13 15   Temp:      TempSrc:      SpO2: 94% 96% 97% 95%  Weight:      Height:       Constitutional: NAD, calm, comfortable  Eyes: PERRL, lids and conjunctivae normal ENMT: Mucous membranes are moist, without exudate or lesions edentulous Neck: normal, supple, no masses, no thyromegaly Respiratory: clear to auscultation bilaterally, no wheezing, no crackles. Normal respiratory effort  Cardiovascular: Regular rate and rhythm, 1 out of 6 murmur, rubs or gallops. No extremity edema. 2+ pedal pulses. No carotid bruits.  Abdomen: Soft, non tender, No hepatosplenomegaly. Bowel sounds positive.  Musculoskeletal: no clubbing / cyanosis. Moves all extremities Skin: no jaundice, No lesions. Neurologic: Sensation intact is 3 out of 5 in the left upper and lower extremity, right is 5 out of 5, speech is mildly dysarthric.  Slight pronator drift on the left.  Finger-to-nose is decreased.  He is alert to place, self, not to date.    Labs on Admission: I have personally reviewed following labs and imaging studies  CBC: Recent Labs  Lab 08/09/17 2328 08/09/17 2334  WBC 7.6  --   NEUTROABS 3.2  --   HGB 14.3 15.0  HCT 42.4 44.0  MCV 93.8  --   PLT 253  --     Basic Metabolic Panel: Recent Labs  Lab 08/09/17 2328 08/09/17 2334  NA 138 140  K 4.6 4.6  CL 101 100*  CO2 26  --   GLUCOSE 100* 99  BUN 27* 30*  CREATININE 1.43* 1.30*  CALCIUM 9.7  --     GFR: Estimated Creatinine Clearance: 43.5 mL/min (A) (by C-G formula based on SCr of 1.3 mg/dL (H)).  Liver Function Tests: Recent Labs  Lab 08/09/17 2328  AST 22  ALT 22   ALKPHOS 39  BILITOT 0.6  PROT 7.2  ALBUMIN 3.9   No results for input(s): LIPASE, AMYLASE in the last 168 hours. No results for input(s): AMMONIA in the last 168 hours.  Coagulation Profile: Recent Labs  Lab 08/09/17 2328  INR 0.99    Cardiac Enzymes: No results for input(s): CKTOTAL, CKMB, CKMBINDEX, TROPONINI in the last 168 hours.  BNP (last 3 results) No results for input(s): PROBNP in the last 8760 hours.  HbA1C: Recent Labs    08/10/17 0334  HGBA1C 6.0*    CBG: Recent Labs  Lab 08/09/17 2327 08/10/17 0733  GLUCAP 104* 130*    Lipid Profile: Recent Labs    08/10/17 0334  CHOL 195  HDL 40*  LDLCALC 130*  TRIG 125  CHOLHDL 4.9    Thyroid Function Tests: No results for input(s): TSH, T4TOTAL, FREET4, T3FREE, THYROIDAB in the last 72 hours.  Anemia Panel: No results for input(s): VITAMINB12, FOLATE, FERRITIN, TIBC, IRON, RETICCTPCT in the last 72 hours.  Urine analysis:    Component Value Date/Time   COLORURINE STRAW (A) 08/10/2017 0001   APPEARANCEUR CLEAR 08/10/2017 0001   LABSPEC 1.012 08/10/2017 0001   PHURINE 6.0 08/10/2017 0001   GLUCOSEU NEGATIVE 08/10/2017 0001   GLUCOSEU NEGATIVE 05/16/2013 1009   HGBUR NEGATIVE 08/10/2017 0001   BILIRUBINUR NEGATIVE 08/10/2017 0001   KETONESUR NEGATIVE 08/10/2017 0001   PROTEINUR NEGATIVE 08/10/2017 0001   UROBILINOGEN 0.2 05/16/2013 1009   NITRITE NEGATIVE 08/10/2017 0001   LEUKOCYTESUR NEGATIVE 08/10/2017 0001    Sepsis Labs: @LABRCNTIP (procalcitonin:4,lacticidven:4) )No results found for this or any previous visit (from the past 240 hour(s)).   Radiological Exams on Admission: Ct Angio Head W Or Wo Contrast  Result Date: 08/10/2017 CLINICAL DATA:  82 y/o  M; left-sided weakness and facial droop. EXAM: CT ANGIOGRAPHY HEAD AND NECK TECHNIQUE: Multidetector CT imaging of the head and neck was performed using the standard protocol during bolus administration of intravenous contrast. Multiplanar  CT image reconstructions and MIPs were obtained to evaluate the vascular anatomy. Carotid stenosis measurements (when applicable) are obtained utilizing NASCET criteria, using the distal internal carotid diameter as the denominator. CONTRAST:  10mL ISOVUE-370 IOPAMIDOL (ISOVUE-370) INJECTION 76% COMPARISON:  03/21/2017 CTA of head and neck. 03/21/2017 MRI head. 08/09/2017 CT head. FINDINGS: CTA NECK FINDINGS Aortic arch: Three-vessel arch. Moderate calcific atherosclerosis. Partially visualized sternotomy, LIMA bypass, and saphenous grafts. Right carotid system: No evidence of dissection, stenosis (50% or greater) or occlusion. Calcified plaque of the right carotid bifurcation with mild less than 50% proximal ICA stenosis. Left carotid system: No evidence of dissection or occlusion. Calcified plaque of left carotid bifurcation with moderate to severe 70% proximal ICA stenosis. Vertebral arteries: Calcified plaque of bilateral vertebral origin stenosis with mild-to-moderate stenosis. Right dominant system. No additional segment of stenosis, dissection, aneurysm, or occlusion. Skeleton: Mild reversal of cervical curvature and grade 1 anterolisthesis at the C5-6 level. Mild discogenic degenerative changes at C5-C7. No high-grade bony canal stenosis. Other neck: Negative. Upper chest: Negative. Review of the MIP images confirms the above findings CTA HEAD FINDINGS Anterior circulation: No significant stenosis, proximal occlusion, aneurysm, or vascular malformation.  Calcified plaque of bilateral carotid siphons with mild less than 50% stenosis. Posterior circulation: No significant stenosis, proximal occlusion, aneurysm, or vascular malformation. Venous sinuses: As permitted by contrast timing, patent. Anatomic variants: Small anterior communicating artery. No posterior communicating artery identified, likely hypoplastic or absent. Review of the MIP images confirms the above findings IMPRESSION: 1. Patent carotid and  vertebral arteries. 2. Patent anterior and posterior intracranial circulation. No large vessel occlusion, aneurysm, or high-grade stenosis. 3. Stable mild less than 50% proximal right ICA stenosis with calcified plaque. 4. Stable moderate to severe 70% proximal left ICA stenosis with calcified plaque. 5. Stable mild less than 50% bilateral carotid siphon stenosis with calcified plaque. These results were called by telephone at the time of interpretation on 08/10/2017 at 12:11 am to Dr. Amie Portland , who verbally acknowledged these results. Electronically Signed   By: Kristine Garbe M.D.   On: 08/10/2017 00:13   Ct Angio Neck W Or Wo Contrast  Result Date: 08/10/2017 CLINICAL DATA:  82 y/o  M; left-sided weakness and facial droop. EXAM: CT ANGIOGRAPHY HEAD AND NECK TECHNIQUE: Multidetector CT imaging of the head and neck was performed using the standard protocol during bolus administration of intravenous contrast. Multiplanar CT image reconstructions and MIPs were obtained to evaluate the vascular anatomy. Carotid stenosis measurements (when applicable) are obtained utilizing NASCET criteria, using the distal internal carotid diameter as the denominator. CONTRAST:  74mL ISOVUE-370 IOPAMIDOL (ISOVUE-370) INJECTION 76% COMPARISON:  03/21/2017 CTA of head and neck. 03/21/2017 MRI head. 08/09/2017 CT head. FINDINGS: CTA NECK FINDINGS Aortic arch: Three-vessel arch. Moderate calcific atherosclerosis. Partially visualized sternotomy, LIMA bypass, and saphenous grafts. Right carotid system: No evidence of dissection, stenosis (50% or greater) or occlusion. Calcified plaque of the right carotid bifurcation with mild less than 50% proximal ICA stenosis. Left carotid system: No evidence of dissection or occlusion. Calcified plaque of left carotid bifurcation with moderate to severe 70% proximal ICA stenosis. Vertebral arteries: Calcified plaque of bilateral vertebral origin stenosis with mild-to-moderate  stenosis. Right dominant system. No additional segment of stenosis, dissection, aneurysm, or occlusion. Skeleton: Mild reversal of cervical curvature and grade 1 anterolisthesis at the C5-6 level. Mild discogenic degenerative changes at C5-C7. No high-grade bony canal stenosis. Other neck: Negative. Upper chest: Negative. Review of the MIP images confirms the above findings CTA HEAD FINDINGS Anterior circulation: No significant stenosis, proximal occlusion, aneurysm, or vascular malformation. Calcified plaque of bilateral carotid siphons with mild less than 50% stenosis. Posterior circulation: No significant stenosis, proximal occlusion, aneurysm, or vascular malformation. Venous sinuses: As permitted by contrast timing, patent. Anatomic variants: Small anterior communicating artery. No posterior communicating artery identified, likely hypoplastic or absent. Review of the MIP images confirms the above findings IMPRESSION: 1. Patent carotid and vertebral arteries. 2. Patent anterior and posterior intracranial circulation. No large vessel occlusion, aneurysm, or high-grade stenosis. 3. Stable mild less than 50% proximal right ICA stenosis with calcified plaque. 4. Stable moderate to severe 70% proximal left ICA stenosis with calcified plaque. 5. Stable mild less than 50% bilateral carotid siphon stenosis with calcified plaque. These results were called by telephone at the time of interpretation on 08/10/2017 at 12:11 am to Dr. Amie Portland , who verbally acknowledged these results. Electronically Signed   By: Kristine Garbe M.D.   On: 08/10/2017 00:13   Mr Brain Wo Contrast  Result Date: 08/10/2017 CLINICAL DATA:  82 y/o M; left-sided weakness and facial droop. History of stroke. EXAM: MRI HEAD WITHOUT CONTRAST TECHNIQUE: Multiplanar, multiecho  pulse sequences of the brain and surrounding structures were obtained without intravenous contrast. COMPARISON:  08/09/2017 CT and CTA of the head. 03/21/2017 MRI  head. FINDINGS: Brain: Subcentimeter focus of reduced diffusion within the right posterior limb of internal capsule (series 5 image 62, series 6, image 22, series 7, image 53) compatible with acute/early subacute infarction. No associated hemorrhage or mass effect. Stable patchy confluent nonspecific foci of T2 FLAIR hyperintense signal abnormality in subcortical and periventricular white matter are compatible with advanced chronic microvascular ischemic changes for age. Moderate brain parenchymal volume loss. No new susceptibility hypointensity to indicate interval intracranial hemorrhage. No structural abnormality of the brain. Hippocampi are symmetric in size and signal. No extra-axial collection, hydrocephalus, or effacement of basilar cisterns. Vascular: Normal flow voids. Skull and upper cervical spine: Normal marrow signal. Sinuses/Orbits: Negative. Other: None. IMPRESSION: 1. Subcentimeter acute/early subacute infarction within the right posterior limb of internal capsule. No associated hemorrhage or mass effect. 2. Stable advanced chronic microvascular ischemic changes and moderate parenchymal volume loss of the brain. These results were called by telephone at the time of interpretation on 08/10/2017 at 12:55 am to Dr. Amie Portland , who verbally acknowledged these results. Electronically Signed   By: Kristine Garbe M.D.   On: 08/10/2017 00:56   Ct Head Code Stroke Wo Contrast  Result Date: 08/09/2017 CLINICAL DATA:  Code stroke. 82 y/o M; left-sided weakness and facial droop. EXAM: CT HEAD WITHOUT CONTRAST TECHNIQUE: Contiguous axial images were obtained from the base of the skull through the vertex without intravenous contrast. COMPARISON:  03/22/2017 CT head.  03/21/2017 MRI head. FINDINGS: Brain: No evidence of acute infarction, hemorrhage, hydrocephalus, extra-axial collection or mass lesion/mass effect. Stable chronic microvascular ischemic changes and parenchymal volume loss of the  brain. Vascular: Calcific atherosclerosis of carotid siphons. No hyperdense vessel identified. Skull: Normal. Negative for fracture or focal lesion. Sinuses/Orbits: No acute finding. Other: Bilateral intra-ocular lens replacement. ASPECTS [facility] Stroke Program Early CT Score) - Ganglionic level infarction (caudate, lentiform nuclei, internal capsule, insula, M1-M3 cortex): 7 - Supraganglionic infarction (M4-M6 cortex): 3 Total score (0-10 with 10 being normal): 10 IMPRESSION: 1. No acute intracranial abnormality identified. 2. ASPECTS is 10 3. Stable chronic microvascular ischemic changes and parenchymal volume loss of the brain. These results were communicated to Dr. Rory Percy at 11:50 pmon 5/29/2019by text page via the [facility] messaging system. Electronically Signed   By: Kristine Garbe M.D.   On: 08/09/2017 23:51    EKG: Independently reviewed. EKG normal sinus rhythm, T wave inversions 3, with aVF, QTC normal Assessment/Plan Principal Problem:   Stroke (cerebrum) (HCC) Active Problems:   Hyperlipidemia   ANEMIA / OTHER   Partial epilepsy with impairment of consciousness (HCC)   Diabetic polyneuropathy (HCC)   Essential hypertension   PAROXYSMAL ATRIAL FIBRILLATION   Congestive heart failure (HCC)   History of gout   S/P CABG (coronary artery bypass graft)   Type 2 diabetes mellitus with complication, with long-term current use of insulin (HCC)   Primary osteoarthritis of both knees   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 of localized onset, not intractable, without status epilepticus ([place])   Acute-left sided weakness/History of stroke/Acute CVA Not a TPA candidate as  last known normal was the night prior to hospitalization.  Rankin 3, NIH is 1 in the setting of mild left hemiparesis and dysarthria.  MRI shows subcentimeter acute-early subacute infarction within the right posterior limb of internal capsule, no hemorrhage. CT Angio of the head  and neck, negative for large vessel occlusion, aneurysm, or high-grade stenosis.  Stable mild proximal right ICA stenosis, with stable moderate to severe 70% proximal left ICA stenosis.Echo pending    Admit to Tele / Inpatient Stroke order set  Allow permissive HTN Follow echo report PT/OT/SLP Aspirin daily 325 mg Lipitor 80 mg daily  Neuro  Following, appreciate their involvement   History of Seizures, supposed to be on Depakote, but the patient has not been compliant to meds.  According to neurology, Depakote levels are low, and meds have been adjusted.  Unclear if the patient experienced seizure prior to admission. UA and UDS neg. ETOH pending   Depakote 500 mg daily or as indicated by neurology Seizure precautions B12, thiamine pending, likely to receive high-dose thiamine IV and discharged on it due to alcohol abuse.  If withdrawal occurs, will place patient on CIWA protocol   Hypertension BP  143/91   Pulse 75  Hold home anti-hypertensive medications  Add Hydralazine Q6 hours as needed for BP 220   Hyperlipidemia Lipid panel shows LDL of 130, total cholesterol 195. Continue home statins with LIpitor 80 mg daily and Zetia  Type II Diabetes Current blood sugar level is 130  Lab Results  Component Value Date   HGBA1C 6.0 (H) 08/10/2017  Hold home oral diabetic medications.  Lantus , SSI  CAD status post CABG EKG normal sinus rhythm, T wave inversions III, with aVF, QTC normal.  Patient chest pain-free. Check troponin, repeat EKG   Gout, no acute flare Continue Allopurinol  History of paroxysmal atrial fibrillation, the patient not on anticoagulants, or aspirin daily.  EKG today is with sinus rhythm, normal QTC. Aspirin 325 mg daily. Follow echo results  Chronic kidney disease stage 2  Current Cr is 1.3, BL 1, improved from recent at 1.43  Lab Results  Component Value Date   CREATININE 1.30 (H) 08/09/2017   CREATININE 1.43 (H) 08/09/2017   CREATININE 1.00  03/21/2017  Gentle IVF as patient can tolerate by mouth, passed swallow exam Repeat BMET in am  Hold NSAIDS     DVT prophylaxis: Lovenox Code Status:    Full Family Communication:  Discussed with patient Disposition Plan: Expect patient to be discharged to home after condition improves Consults called:    Neurology, Dr. Rory Percy, per EDP Admission status: Telemetry observation   Sharene Butters, PA-C [facility]   Amion text  [phone]   08/10/2017, 8:47 AM

## 2017-08-11 ENCOUNTER — Other Ambulatory Visit: Payer: Self-pay

## 2017-08-11 ENCOUNTER — Inpatient Hospital Stay (HOSPITAL_COMMUNITY)
Admission: RE | Admit: 2017-08-11 | Discharge: 2017-09-13 | DRG: 057 | Disposition: A | Discharge status: Home Health | Payer: Medicare Other | Source: Intra-hospital | Attending: Physical Medicine & Rehabilitation | Admitting: Physical Medicine & Rehabilitation

## 2017-08-11 ENCOUNTER — Encounter (HOSPITAL_COMMUNITY): Payer: Self-pay | Admitting: *Deleted

## 2017-08-11 DIAGNOSIS — Z7982 Long term (current) use of aspirin: Secondary | ICD-10-CM

## 2017-08-11 DIAGNOSIS — E1169 Type 2 diabetes mellitus with other specified complication: Secondary | ICD-10-CM | POA: Diagnosis not present

## 2017-08-11 DIAGNOSIS — Z951 Presence of aortocoronary bypass graft: Secondary | ICD-10-CM | POA: Diagnosis not present

## 2017-08-11 DIAGNOSIS — G8110 Spastic hemiplegia affecting unspecified side: Secondary | ICD-10-CM | POA: Diagnosis not present

## 2017-08-11 DIAGNOSIS — E785 Hyperlipidemia, unspecified: Secondary | ICD-10-CM | POA: Diagnosis present

## 2017-08-11 DIAGNOSIS — I5032 Chronic diastolic (congestive) heart failure: Secondary | ICD-10-CM | POA: Diagnosis not present

## 2017-08-11 DIAGNOSIS — G8194 Hemiplegia, unspecified affecting left nondominant side: Secondary | ICD-10-CM | POA: Diagnosis not present

## 2017-08-11 DIAGNOSIS — E1142 Type 2 diabetes mellitus with diabetic polyneuropathy: Secondary | ICD-10-CM | POA: Diagnosis present

## 2017-08-11 DIAGNOSIS — Z794 Long term (current) use of insulin: Secondary | ICD-10-CM

## 2017-08-11 DIAGNOSIS — I69322 Dysarthria following cerebral infarction: Secondary | ICD-10-CM

## 2017-08-11 DIAGNOSIS — I2510 Atherosclerotic heart disease of native coronary artery without angina pectoris: Secondary | ICD-10-CM | POA: Diagnosis present

## 2017-08-11 DIAGNOSIS — Z820 Family history of epilepsy and other diseases of the nervous system: Secondary | ICD-10-CM

## 2017-08-11 DIAGNOSIS — O1002 Pre-existing essential hypertension complicating childbirth: Secondary | ICD-10-CM

## 2017-08-11 DIAGNOSIS — E669 Obesity, unspecified: Secondary | ICD-10-CM

## 2017-08-11 DIAGNOSIS — I6523 Occlusion and stenosis of bilateral carotid arteries: Secondary | ICD-10-CM | POA: Diagnosis present

## 2017-08-11 DIAGNOSIS — I4891 Unspecified atrial fibrillation: Secondary | ICD-10-CM

## 2017-08-11 DIAGNOSIS — R4586 Emotional lability: Secondary | ICD-10-CM | POA: Diagnosis present

## 2017-08-11 DIAGNOSIS — I5042 Chronic combined systolic (congestive) and diastolic (congestive) heart failure: Secondary | ICD-10-CM

## 2017-08-11 DIAGNOSIS — I69354 Hemiplegia and hemiparesis following cerebral infarction affecting left non-dominant side: Secondary | ICD-10-CM | POA: Diagnosis present

## 2017-08-11 DIAGNOSIS — I480 Paroxysmal atrial fibrillation: Secondary | ICD-10-CM | POA: Diagnosis present

## 2017-08-11 DIAGNOSIS — M1711 Unilateral primary osteoarthritis, right knee: Secondary | ICD-10-CM

## 2017-08-11 DIAGNOSIS — I69321 Dysphasia following cerebral infarction: Secondary | ICD-10-CM | POA: Diagnosis not present

## 2017-08-11 DIAGNOSIS — Z87891 Personal history of nicotine dependence: Secondary | ICD-10-CM

## 2017-08-11 DIAGNOSIS — E1151 Type 2 diabetes mellitus with diabetic peripheral angiopathy without gangrene: Secondary | ICD-10-CM | POA: Diagnosis present

## 2017-08-11 DIAGNOSIS — I639 Cerebral infarction, unspecified: Secondary | ICD-10-CM

## 2017-08-11 DIAGNOSIS — Z823 Family history of stroke: Secondary | ICD-10-CM

## 2017-08-11 DIAGNOSIS — I739 Peripheral vascular disease, unspecified: Secondary | ICD-10-CM | POA: Diagnosis not present

## 2017-08-11 DIAGNOSIS — R1310 Dysphagia, unspecified: Secondary | ICD-10-CM | POA: Diagnosis present

## 2017-08-11 DIAGNOSIS — I69392 Facial weakness following cerebral infarction: Secondary | ICD-10-CM | POA: Diagnosis not present

## 2017-08-11 DIAGNOSIS — I69391 Dysphagia following cerebral infarction: Secondary | ICD-10-CM | POA: Diagnosis not present

## 2017-08-11 DIAGNOSIS — G40909 Epilepsy, unspecified, not intractable, without status epilepticus: Secondary | ICD-10-CM | POA: Diagnosis present

## 2017-08-11 DIAGNOSIS — M109 Gout, unspecified: Secondary | ICD-10-CM | POA: Diagnosis present

## 2017-08-11 DIAGNOSIS — I10 Essential (primary) hypertension: Secondary | ICD-10-CM | POA: Diagnosis not present

## 2017-08-11 DIAGNOSIS — I110 Hypertensive heart disease with heart failure: Secondary | ICD-10-CM | POA: Diagnosis present

## 2017-08-11 DIAGNOSIS — E11649 Type 2 diabetes mellitus with hypoglycemia without coma: Secondary | ICD-10-CM | POA: Diagnosis not present

## 2017-08-11 DIAGNOSIS — Z7289 Other problems related to lifestyle: Secondary | ICD-10-CM

## 2017-08-11 DIAGNOSIS — K5900 Constipation, unspecified: Secondary | ICD-10-CM | POA: Diagnosis present

## 2017-08-11 DIAGNOSIS — M10061 Idiopathic gout, right knee: Secondary | ICD-10-CM

## 2017-08-11 DIAGNOSIS — D62 Acute posthemorrhagic anemia: Secondary | ICD-10-CM

## 2017-08-11 DIAGNOSIS — I5041 Acute combined systolic (congestive) and diastolic (congestive) heart failure: Secondary | ICD-10-CM | POA: Diagnosis not present

## 2017-08-11 DIAGNOSIS — Z8673 Personal history of transient ischemic attack (TIA), and cerebral infarction without residual deficits: Secondary | ICD-10-CM

## 2017-08-11 LAB — GLUCOSE, CAPILLARY
GLUCOSE-CAPILLARY: 127 mg/dL — AB (ref 65–99)
Glucose-Capillary: 220 mg/dL — ABNORMAL HIGH (ref 65–99)
Glucose-Capillary: 237 mg/dL — ABNORMAL HIGH (ref 65–99)

## 2017-08-11 LAB — CBC
HEMATOCRIT: 38.9 % — AB (ref 39.0–52.0)
HEMATOCRIT: 42 % (ref 39.0–52.0)
Hemoglobin: 13.1 g/dL (ref 13.0–17.0)
Hemoglobin: 14 g/dL (ref 13.0–17.0)
MCH: 31.9 pg (ref 26.0–34.0)
MCH: 31.6 pg (ref 26.0–34.0)
MCHC: 33.7 g/dL (ref 30.0–36.0)
MCHC: 33.3 g/dL (ref 30.0–36.0)
MCV: 94.6 fL (ref 78.0–100.0)
MCV: 94.8 fL (ref 78.0–100.0)
PLATELETS: 251 10*3/uL (ref 150–400)
Platelets: 223 10*3/uL (ref 150–400)
RBC: 4.11 MIL/uL — ABNORMAL LOW (ref 4.22–5.81)
RBC: 4.43 MIL/uL (ref 4.22–5.81)
RDW: 12.6 % (ref 11.5–15.5)
RDW: 12.6 % (ref 11.5–15.5)
WBC: 7.2 10*3/uL (ref 4.0–10.5)
WBC: 8.3 10*3/uL (ref 4.0–10.5)

## 2017-08-11 LAB — BASIC METABOLIC PANEL
Anion gap: 4 — ABNORMAL LOW (ref 5–15)
BUN: 16 mg/dL (ref 6–20)
CALCIUM: 9.1 mg/dL (ref 8.9–10.3)
CO2: 24 mmol/L (ref 22–32)
Chloride: 112 mmol/L — ABNORMAL HIGH (ref 101–111)
Creatinine, Ser: 1.08 mg/dL (ref 0.61–1.24)
GFR calc Af Amer: >60 mL/min (ref >60)
GLUCOSE: 127 mg/dL — AB (ref 65–99)
Potassium: 4.1 mmol/L (ref 3.5–5.1)
Sodium: 140 mmol/L (ref 135–145)

## 2017-08-11 LAB — CREATININE, SERUM
Creatinine, Ser: 1.05 mg/dL (ref 0.61–1.24)
GFR calc non Af Amer: >60 mL/min (ref >60)

## 2017-08-11 MED ORDER — DIVALPROEX SODIUM ER 500 MG PO TB24
500.0000 mg | ORAL_TABLET | Freq: Every day | ORAL | Status: DC
  Administered 2017-08-12 – 2017-09-13 (×33): 500 mg via ORAL
  Filled 2017-08-11 (×33): qty 1

## 2017-08-11 MED ORDER — ENOXAPARIN SODIUM 40 MG/0.4ML ~~LOC~~ SOLN
40.0000 mg | SUBCUTANEOUS | Status: DC
  Administered 2017-08-12 – 2017-09-12 (×32): 40 mg via SUBCUTANEOUS
  Filled 2017-08-11 (×32): qty 0.4

## 2017-08-11 MED ORDER — ATORVASTATIN CALCIUM 80 MG PO TABS: 80.0000 mg | ORAL_TABLET | Freq: Every day | ORAL | Status: DC

## 2017-08-11 MED ORDER — ENOXAPARIN SODIUM 40 MG/0.4ML ~~LOC~~ SOLN: 40.0000 mg | SUBCUTANEOUS | Status: DC

## 2017-08-11 MED ORDER — INSULIN GLARGINE 100 UNIT/ML ~~LOC~~ SOLN: 10.0000 [IU] | Freq: Every day | SUBCUTANEOUS | Status: DC

## 2017-08-11 MED ORDER — ACETAMINOPHEN 325 MG PO TABS
650.0000 mg | ORAL_TABLET | ORAL | Status: DC | PRN
  Administered 2017-09-04: 650 mg via ORAL
  Filled 2017-08-11: qty 2

## 2017-08-11 MED ORDER — ADULT MULTIVITAMIN W/MINERALS CH: 1.0000 | ORAL_TABLET | Freq: Every day | ORAL | Status: DC

## 2017-08-11 MED ORDER — VITAMIN B-1 100 MG PO TABS
100.0000 mg | ORAL_TABLET | Freq: Every day | ORAL | Status: DC
  Administered 2017-08-12 – 2017-09-13 (×33): 100 mg via ORAL
  Filled 2017-08-11 (×33): qty 1

## 2017-08-11 MED ORDER — ASPIRIN EC 81 MG PO TBEC: 81.0000 mg | DELAYED_RELEASE_TABLET | Freq: Every day | ORAL | Status: DC

## 2017-08-11 MED ORDER — ALBUTEROL SULFATE (2.5 MG/3ML) 0.083% IN NEBU: 2.5000 mg | INHALATION_SOLUTION | Freq: Four times a day (QID) | RESPIRATORY_TRACT | Status: DC | PRN

## 2017-08-11 MED ORDER — LINAGLIPTIN 5 MG PO TABS
5.0000 mg | ORAL_TABLET | Freq: Every day | ORAL | Status: DC
  Administered 2017-08-12 – 2017-09-13 (×33): 5 mg via ORAL
  Filled 2017-08-11 (×34): qty 1

## 2017-08-11 MED ORDER — INSULIN ASPART 100 UNIT/ML ~~LOC~~ SOLN
0.0000 [IU] | Freq: Three times a day (TID) | SUBCUTANEOUS | Status: DC
  Administered 2017-08-12: 3 [IU] via SUBCUTANEOUS
  Administered 2017-08-13: 2 [IU] via SUBCUTANEOUS
  Administered 2017-08-13: 1 [IU] via SUBCUTANEOUS
  Administered 2017-08-13: 3 [IU] via SUBCUTANEOUS
  Administered 2017-08-14 – 2017-08-15 (×4): 2 [IU] via SUBCUTANEOUS
  Administered 2017-08-15: 1 [IU] via SUBCUTANEOUS
  Administered 2017-08-15: 2 [IU] via SUBCUTANEOUS
  Administered 2017-08-16: 1 [IU] via SUBCUTANEOUS
  Administered 2017-08-16 – 2017-08-17 (×2): 2 [IU] via SUBCUTANEOUS
  Administered 2017-08-18 – 2017-08-20 (×4): 1 [IU] via SUBCUTANEOUS
  Administered 2017-08-21 – 2017-08-22 (×2): 2 [IU] via SUBCUTANEOUS
  Administered 2017-08-22: 1 [IU] via SUBCUTANEOUS
  Administered 2017-08-23: 2 [IU] via SUBCUTANEOUS
  Administered 2017-08-24: 1 [IU] via SUBCUTANEOUS
  Administered 2017-08-24 – 2017-08-25 (×2): 2 [IU] via SUBCUTANEOUS
  Administered 2017-08-25 – 2017-08-26 (×2): 1 [IU] via SUBCUTANEOUS
  Administered 2017-08-26: 3 [IU] via SUBCUTANEOUS
  Administered 2017-08-27: 2 [IU] via SUBCUTANEOUS
  Administered 2017-08-27: 1 [IU] via SUBCUTANEOUS
  Administered 2017-08-28: 2 [IU] via SUBCUTANEOUS
  Administered 2017-08-28: 1 [IU] via SUBCUTANEOUS
  Administered 2017-08-29: 2 [IU] via SUBCUTANEOUS
  Administered 2017-08-29 – 2017-08-30 (×2): 1 [IU] via SUBCUTANEOUS
  Administered 2017-08-30: 3 [IU] via SUBCUTANEOUS
  Administered 2017-08-31: 1 [IU] via SUBCUTANEOUS
  Administered 2017-08-31: 2 [IU] via SUBCUTANEOUS
  Administered 2017-09-01: 1 [IU] via SUBCUTANEOUS
  Administered 2017-09-01: 2 [IU] via SUBCUTANEOUS
  Administered 2017-09-02: 1 [IU] via SUBCUTANEOUS
  Administered 2017-09-02: 2 [IU] via SUBCUTANEOUS
  Administered 2017-09-03: 1 [IU] via SUBCUTANEOUS
  Administered 2017-09-03: 2 [IU] via SUBCUTANEOUS
  Administered 2017-09-04: 1 [IU] via SUBCUTANEOUS
  Administered 2017-09-04 (×2): 2 [IU] via SUBCUTANEOUS
  Administered 2017-09-05 (×2): 1 [IU] via SUBCUTANEOUS
  Administered 2017-09-05: 2 [IU] via SUBCUTANEOUS
  Administered 2017-09-06: 1 [IU] via SUBCUTANEOUS
  Administered 2017-09-06: 2 [IU] via SUBCUTANEOUS
  Administered 2017-09-06 – 2017-09-07 (×2): 1 [IU] via SUBCUTANEOUS
  Administered 2017-09-07: 2 [IU] via SUBCUTANEOUS
  Administered 2017-09-07: 1 [IU] via SUBCUTANEOUS
  Administered 2017-09-08: 2 [IU] via SUBCUTANEOUS
  Administered 2017-09-08 (×2): 1 [IU] via SUBCUTANEOUS
  Administered 2017-09-09 – 2017-09-10 (×3): 2 [IU] via SUBCUTANEOUS
  Administered 2017-09-10 – 2017-09-11 (×2): 1 [IU] via SUBCUTANEOUS
  Administered 2017-09-11 – 2017-09-13 (×3): 2 [IU] via SUBCUTANEOUS

## 2017-08-11 MED ORDER — RESOURCE THICKENUP CLEAR PO POWD
ORAL | Status: DC | PRN
  Filled 2017-08-11: qty 125

## 2017-08-11 MED ORDER — ASPIRIN 81 MG PO TBEC: 81.0000 mg | DELAYED_RELEASE_TABLET | Freq: Every day | ORAL | Status: DC

## 2017-08-11 MED ORDER — ATORVASTATIN CALCIUM 80 MG PO TABS
80.0000 mg | ORAL_TABLET | Freq: Every day | ORAL | Status: DC
  Administered 2017-08-12 – 2017-09-13 (×33): 80 mg via ORAL
  Filled 2017-08-11 (×33): qty 1

## 2017-08-11 MED ORDER — NIACIN ER (ANTIHYPERLIPIDEMIC) 500 MG PO TBCR
500.0000 mg | EXTENDED_RELEASE_TABLET | Freq: Every day | ORAL | Status: DC
  Administered 2017-08-11 – 2017-09-12 (×33): 500 mg via ORAL
  Filled 2017-08-11 (×33): qty 1

## 2017-08-11 MED ORDER — GLIMEPIRIDE 4 MG PO TABS
4.0000 mg | ORAL_TABLET | Freq: Every day | ORAL | Status: DC
  Administered 2017-08-12 – 2017-08-20 (×9): 4 mg via ORAL
  Filled 2017-08-11 (×10): qty 1

## 2017-08-11 MED ORDER — ASPIRIN 325 MG PO TABS
325.0000 mg | ORAL_TABLET | Freq: Every day | ORAL | Status: DC
  Administered 2017-08-12 – 2017-09-13 (×33): 325 mg via ORAL
  Filled 2017-08-11 (×33): qty 1

## 2017-08-11 MED ORDER — ASPIRIN 325 MG PO TABS: 325.0000 mg | ORAL_TABLET | Freq: Every day | ORAL | Status: DC

## 2017-08-11 MED ORDER — CLOPIDOGREL BISULFATE 75 MG PO TABS
75.0000 mg | ORAL_TABLET | Freq: Every day | ORAL | Status: DC
Start: 2017-08-12 — End: 2017-08-31
  Administered 2017-08-12 – 2017-08-31 (×20): 75 mg via ORAL
  Filled 2017-08-11 (×20): qty 1

## 2017-08-11 MED ORDER — METFORMIN HCL 500 MG PO TABS
1000.0000 mg | ORAL_TABLET | Freq: Two times a day (BID) | ORAL | Status: DC
  Administered 2017-08-12 – 2017-08-22 (×20): 1000 mg via ORAL
  Filled 2017-08-11 (×22): qty 2

## 2017-08-11 MED ORDER — ADULT MULTIVITAMIN W/MINERALS CH
1.0000 | ORAL_TABLET | Freq: Every day | ORAL | Status: DC
  Administered 2017-08-12 – 2017-09-13 (×33): 1 via ORAL
  Filled 2017-08-11 (×33): qty 1

## 2017-08-11 MED ORDER — ACETAMINOPHEN 650 MG RE SUPP
650.0000 mg | RECTAL | Status: DC | PRN
  Filled 2017-08-11: qty 1

## 2017-08-11 MED ORDER — CLOPIDOGREL BISULFATE 75 MG PO TABS: 75.0000 mg | ORAL_TABLET | Freq: Every day | ORAL | Status: DC

## 2017-08-11 MED ORDER — ACETAMINOPHEN 160 MG/5ML PO SOLN: 650.0000 mg | ORAL | Status: DC | PRN

## 2017-08-11 MED ORDER — METOPROLOL SUCCINATE ER 50 MG PO TB24
50.0000 mg | ORAL_TABLET | Freq: Every day | ORAL | Status: DC
  Administered 2017-08-12 – 2017-09-13 (×33): 50 mg via ORAL
  Filled 2017-08-11 (×33): qty 1

## 2017-08-11 MED ORDER — LOSARTAN POTASSIUM 50 MG PO TABS
25.0000 mg | ORAL_TABLET | Freq: Every day | ORAL | Status: DC
  Administered 2017-08-12 – 2017-09-13 (×33): 25 mg via ORAL
  Filled 2017-08-11 (×33): qty 1

## 2017-08-11 NOTE — Progress Notes (Signed)
STROKE TEAM PROGRESS NOTE   INTERVAL HISTORY  lmultiple family members are at the bedside.patient's speech is improved slightly but left hemiplegia persists. Echocardiogram was unremarkable. Patient has been evaluated by therapist and inpatient rehabilitation has been recommended  Vitals:   08/11/17 0032 08/11/17 0352 08/11/17 0734 08/11/17 1140  BP: 135/71 (!) 154/67 (!) 153/76 136/78  Pulse: 86 78 85 99  Resp: 18 18 18 18   Temp: 98.4 F (36.9 C) 98.2 F (36.8 C) 98.3 F (36.8 C) 97.9 F (36.6 C)  TempSrc: Oral Oral Oral Oral  SpO2: 95% 96% 95% 93%  Weight:      Height:        CBC:  Recent Labs  Lab 08/09/17 2328 08/09/17 2334 08/11/17 0454  WBC 7.6  --  7.2  NEUTROABS 3.2  --   --   HGB 14.3 15.0 13.1  HCT 42.4 44.0 38.9*  MCV 93.8  --  94.6  PLT 253  --  938    Basic Metabolic Panel:  Recent Labs  Lab 08/09/17 2328 08/09/17 2334 08/11/17 0454  NA 138 140 140  K 4.6 4.6 4.1  CL 101 100* 112*  CO2 26  --  24  GLUCOSE 100* 99 127*  BUN 27* 30* 16  CREATININE 1.43* 1.30* 1.08  CALCIUM 9.7  --  9.1   Lipid Panel:     Component Value Date/Time   CHOL 195 08/10/2017 0334   TRIG 125 08/10/2017 0334   HDL 40 (L) 08/10/2017 0334   CHOLHDL 4.9 08/10/2017 0334   VLDL 25 08/10/2017 0334   LDLCALC 130 (H) 08/10/2017 0334   HgbA1c:  Lab Results  Component Value Date   HGBA1C 6.0 (H) 08/10/2017   Urine Drug Screen:     Component Value Date/Time   LABOPIA NONE DETECTED 08/10/2017 0001   COCAINSCRNUR NONE DETECTED 08/10/2017 0001   LABBENZ NONE DETECTED 08/10/2017 0001   AMPHETMU NONE DETECTED 08/10/2017 0001   THCU NONE DETECTED 08/10/2017 0001   LABBARB NONE DETECTED 08/10/2017 0001    Alcohol Level     Component Value Date/Time   ETH <10 08/09/2017 2328    IMAGING Ct Angio Head W Or Wo Contrast  Result Date: 08/10/2017 CLINICAL DATA:  82 y/o  M; left-sided weakness and facial droop. EXAM: CT ANGIOGRAPHY HEAD AND NECK TECHNIQUE: Multidetector CT  imaging of the head and neck was performed using the standard protocol during bolus administration of intravenous contrast. Multiplanar CT image reconstructions and MIPs were obtained to evaluate the vascular anatomy. Carotid stenosis measurements (when applicable) are obtained utilizing NASCET criteria, using the distal internal carotid diameter as the denominator. CONTRAST:  72mL ISOVUE-370 IOPAMIDOL (ISOVUE-370) INJECTION 76% COMPARISON:  03/21/2017 CTA of head and neck. 03/21/2017 MRI head. 08/09/2017 CT head. FINDINGS: CTA NECK FINDINGS Aortic arch: Three-vessel arch. Moderate calcific atherosclerosis. Partially visualized sternotomy, LIMA bypass, and saphenous grafts. Right carotid system: No evidence of dissection, stenosis (50% or greater) or occlusion. Calcified plaque of the right carotid bifurcation with mild less than 50% proximal ICA stenosis. Left carotid system: No evidence of dissection or occlusion. Calcified plaque of left carotid bifurcation with moderate to severe 70% proximal ICA stenosis. Vertebral arteries: Calcified plaque of bilateral vertebral origin stenosis with mild-to-moderate stenosis. Right dominant system. No additional segment of stenosis, dissection, aneurysm, or occlusion. Skeleton: Mild reversal of cervical curvature and grade 1 anterolisthesis at the C5-6 level. Mild discogenic degenerative changes at C5-C7. No high-grade bony canal stenosis. Other neck: Negative. Upper chest: Negative.  Review of the MIP images confirms the above findings CTA HEAD FINDINGS Anterior circulation: No significant stenosis, proximal occlusion, aneurysm, or vascular malformation. Calcified plaque of bilateral carotid siphons with mild less than 50% stenosis. Posterior circulation: No significant stenosis, proximal occlusion, aneurysm, or vascular malformation. Venous sinuses: As permitted by contrast timing, patent. Anatomic variants: Small anterior communicating artery. No posterior communicating  artery identified, likely hypoplastic or absent. Review of the MIP images confirms the above findings IMPRESSION: 1. Patent carotid and vertebral arteries. 2. Patent anterior and posterior intracranial circulation. No large vessel occlusion, aneurysm, or high-grade stenosis. 3. Stable mild less than 50% proximal right ICA stenosis with calcified plaque. 4. Stable moderate to severe 70% proximal left ICA stenosis with calcified plaque. 5. Stable mild less than 50% bilateral carotid siphon stenosis with calcified plaque. These results were called by telephone at the time of interpretation on 08/10/2017 at 12:11 am to Dr. Amie Portland , who verbally acknowledged these results. Electronically Signed   By: Kristine Garbe M.D.   On: 08/10/2017 00:13   Ct Angio Neck W Or Wo Contrast  Result Date: 08/10/2017 CLINICAL DATA:  82 y/o  M; left-sided weakness and facial droop. EXAM: CT ANGIOGRAPHY HEAD AND NECK TECHNIQUE: Multidetector CT imaging of the head and neck was performed using the standard protocol during bolus administration of intravenous contrast. Multiplanar CT image reconstructions and MIPs were obtained to evaluate the vascular anatomy. Carotid stenosis measurements (when applicable) are obtained utilizing NASCET criteria, using the distal internal carotid diameter as the denominator. CONTRAST:  2mL ISOVUE-370 IOPAMIDOL (ISOVUE-370) INJECTION 76% COMPARISON:  03/21/2017 CTA of head and neck. 03/21/2017 MRI head. 08/09/2017 CT head. FINDINGS: CTA NECK FINDINGS Aortic arch: Three-vessel arch. Moderate calcific atherosclerosis. Partially visualized sternotomy, LIMA bypass, and saphenous grafts. Right carotid system: No evidence of dissection, stenosis (50% or greater) or occlusion. Calcified plaque of the right carotid bifurcation with mild less than 50% proximal ICA stenosis. Left carotid system: No evidence of dissection or occlusion. Calcified plaque of left carotid bifurcation with moderate to  severe 70% proximal ICA stenosis. Vertebral arteries: Calcified plaque of bilateral vertebral origin stenosis with mild-to-moderate stenosis. Right dominant system. No additional segment of stenosis, dissection, aneurysm, or occlusion. Skeleton: Mild reversal of cervical curvature and grade 1 anterolisthesis at the C5-6 level. Mild discogenic degenerative changes at C5-C7. No high-grade bony canal stenosis. Other neck: Negative. Upper chest: Negative. Review of the MIP images confirms the above findings CTA HEAD FINDINGS Anterior circulation: No significant stenosis, proximal occlusion, aneurysm, or vascular malformation. Calcified plaque of bilateral carotid siphons with mild less than 50% stenosis. Posterior circulation: No significant stenosis, proximal occlusion, aneurysm, or vascular malformation. Venous sinuses: As permitted by contrast timing, patent. Anatomic variants: Small anterior communicating artery. No posterior communicating artery identified, likely hypoplastic or absent. Review of the MIP images confirms the above findings IMPRESSION: 1. Patent carotid and vertebral arteries. 2. Patent anterior and posterior intracranial circulation. No large vessel occlusion, aneurysm, or high-grade stenosis. 3. Stable mild less than 50% proximal right ICA stenosis with calcified plaque. 4. Stable moderate to severe 70% proximal left ICA stenosis with calcified plaque. 5. Stable mild less than 50% bilateral carotid siphon stenosis with calcified plaque. These results were called by telephone at the time of interpretation on 08/10/2017 at 12:11 am to Dr. Amie Portland , who verbally acknowledged these results. Electronically Signed   By: Kristine Garbe M.D.   On: 08/10/2017 00:13   Mr Brain Wo Contrast  Result Date:  08/10/2017 CLINICAL DATA:  82 y/o M; left-sided weakness and facial droop. History of stroke. EXAM: MRI HEAD WITHOUT CONTRAST TECHNIQUE: Multiplanar, multiecho pulse sequences of the brain  and surrounding structures were obtained without intravenous contrast. COMPARISON:  08/09/2017 CT and CTA of the head. 03/21/2017 MRI head. FINDINGS: Brain: Subcentimeter focus of reduced diffusion within the right posterior limb of internal capsule (series 5 image 62, series 6, image 22, series 7, image 53) compatible with acute/early subacute infarction. No associated hemorrhage or mass effect. Stable patchy confluent nonspecific foci of T2 FLAIR hyperintense signal abnormality in subcortical and periventricular white matter are compatible with advanced chronic microvascular ischemic changes for age. Moderate brain parenchymal volume loss. No new susceptibility hypointensity to indicate interval intracranial hemorrhage. No structural abnormality of the brain. Hippocampi are symmetric in size and signal. No extra-axial collection, hydrocephalus, or effacement of basilar cisterns. Vascular: Normal flow voids. Skull and upper cervical spine: Normal marrow signal. Sinuses/Orbits: Negative. Other: None. IMPRESSION: 1. Subcentimeter acute/early subacute infarction within the right posterior limb of internal capsule. No associated hemorrhage or mass effect. 2. Stable advanced chronic microvascular ischemic changes and moderate parenchymal volume loss of the brain. These results were called by telephone at the time of interpretation on 08/10/2017 at 12:55 am to Dr. Amie Portland , who verbally acknowledged these results. Electronically Signed   By: Kristine Garbe M.D.   On: 08/10/2017 00:56   Ct Head Code Stroke Wo Contrast  Result Date: 08/09/2017 CLINICAL DATA:  Code stroke. 82 y/o M; left-sided weakness and facial droop. EXAM: CT HEAD WITHOUT CONTRAST TECHNIQUE: Contiguous axial images were obtained from the base of the skull through the vertex without intravenous contrast. COMPARISON:  03/22/2017 CT head.  03/21/2017 MRI head. FINDINGS: Brain: No evidence of acute infarction, hemorrhage, hydrocephalus,  extra-axial collection or mass lesion/mass effect. Stable chronic microvascular ischemic changes and parenchymal volume loss of the brain. Vascular: Calcific atherosclerosis of carotid siphons. No hyperdense vessel identified. Skull: Normal. Negative for fracture or focal lesion. Sinuses/Orbits: No acute finding. Other: Bilateral intra-ocular lens replacement. ASPECTS [facility] Stroke Program Early CT Score) - Ganglionic level infarction (caudate, lentiform nuclei, internal capsule, insula, M1-M3 cortex): 7 - Supraganglionic infarction (M4-M6 cortex): 3 Total score (0-10 with 10 being normal): 10 IMPRESSION: 1. No acute intracranial abnormality identified. 2. ASPECTS is 10 3. Stable chronic microvascular ischemic changes and parenchymal volume loss of the brain. These results were communicated to Dr. Rory Percy at 11:50 pmon 5/29/2019by text page via the [facility] messaging system. Electronically Signed   By: Kristine Garbe M.D.   On: 08/09/2017 23:51   2D Echocardiogram  pending   PHYSICAL EXAM GENERAL: Awake, alert in NAD HEENT: - Normocephalic and atraumatic, dry mm, no LN++, no Thyromegally LUNGS - Clear to auscultation bilaterally with no wheezes CV - S1S2 RRR, no m/r/g, equal pulses bilaterally. Ext: warm, well perfused, intact peripheral pulses, no edema  NEURO:  Mental Status: AA&Ox3  Language: speech is mildly dysarthric.  Naming, repetition, fluency, and comprehension intact. Cranial Nerves: PERRL. EOMI, visual fields full, L facial weakness, facial sensation intact, hearing intact, tongue/uvula/soft palate midline, normal sternocleidomastoid and trapezius muscle strength. No evidence of tongue atrophy or fibrillations Motor: L arm and leg drift. LEU 2/5 progressed to 4/5 and LLE 2/5. Right side 5/5 Tone: is normal and bulk is normal Sensation- Intact to light touch bilaterally Coordination: FTN intact bilaterally Gait- deferred   ASSESSMENT/PLAN Fred [name] is a 82 y.o.  male with history of stroke, hypertension,  hyperlipidemia, diabetes, gout, atrial fibrillation not on anticoagulation, coronary artery disease, CABG, CHF, seizure, peripheral vascular disease presenting with slurred speech and left facial droop.   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 Small vessel disease. Atrophy. ASPECTS 10.     CTA head & neck no ELVO.  Stenosis:  R ICA < 50%. L ICA 70 %. B ICA siphon < 50%.  MRI  R PLIC infarct. Advanced small vessel disease. Atrophy.   2D Echo  Normal EF no clots  LDL 130  HgbA1c 6.0  Lovenox 40 mg sq daily for VTE prophylaxis Diet Order           DIET DYS 2 Room service appropriate? Yes; Fluid consistency: Nectar Thick  Diet effective now          aspirin 325 mg daily prior to admission, now on aspirin 325 mg daily. Given TIA mild stroke, will place on aspirin 81 mg and plavix 75 mg daily x 3 weeks, then aspirin alone. Orders adjusted.   Therapy recommendations:  CIR. Consult in place  Disposition:  pending   CABG r/t Atrial Fibrillation  Home anticoagulation:  none   Operative, post-operative AF is not indication for long-term AC  Current stroke is not embolic but due to atrial fibrillation    Hypertension  140-160s . Permissive hypertension (OK if < 220/120) but gradually normalize in 5-7 days . Long-term BP goal normotensive  Hyperlipidemia  Home meds:  lipitor 40 and zetia  lipitor increased to 80 in hospital  LDL 130, goal < 70  Continue statin at discharge  Diabetes type II Diabetic polyneuropathy  HgbA1c 6.0, goal < 7.0  Controlled  Other Stroke Risk Factors  Advanced age  Former Cigarette smoker, quit 40 years ago  ETOH abuse, increased drinking frequency due to wife's diagnosis of Lewy body dementia, advised to drink no more  than 2 drink(s) a day  Obesity, Body mass index is 32.95 kg/m., recommend weight loss, diet and exercise as appropriate   Hx stroke  03/2017 -  Stroke like episode treated with IV TPA with full recovery but no infarct seen on MRI - garbled speech and confusion - EEG neg - sz vs TIA. Put on depakote.  08/2014 - R posterior frontal lobe infarct d/t small vessel disease no residual deficits  Family hx stroke (mother)  Coronary artery disease s/p CABG  PVD  Other Active Problems  Possible complex seizure disorder - on depakote - see stroke hx details 03/2017  Hospital day # 1    08/11/2017 1:57 PM       Patient has presented withdysarthria and left hemiparesis and seems to have shown a neurological worsening. Recommend   IV hydration and dual antiplatelet therapy.  Aggressive risk factor modification. Long discussion at the bedside with the patient and his 2 daughters and one granddaughter and answered questions.  Transfer to inpatient rehabilitation over the next few days when bed available.Follow-up as an outpatient in the stroke clinic in 6 weeks. Stroke team will sign off. Kindly call for questions. Fred Contras, MD Medical Director [facility] Stroke Center Pager: [phone] 08/11/2017 1:57 PM  To contact Stroke Continuity provider, please refer to [email or web address] After hours, contact General Neurology

## 2017-08-11 NOTE — Progress Notes (Signed)
Meredith Staggers, MD  Physician  Physical Medicine and Rehabilitation  Consult Note  Signed  Date of Service:  08/11/2017 10:44 AM       Related encounter: ED to Hosp-Admission (Current) from 08/09/2017 in [place] [facility]      Signed      Expand All Collapse All       Show:Clear all [x] Manual[x] Template[] Copied  Added by: [x] Angiulli, Lavon Paganini, PA-C[x] Meredith Staggers, MD   [] Hover for details        Physical Medicine and Rehabilitation Consult Reason for Consult: Left side weakness facial droop and slurred speech Referring Physician: [facility]   HPI: Fred [name] is a 82 y.o. right-handed male with history of hypertension, seizure with history of CVA with little residual January 2019 and received TPA, CAD with CABG maintained on aspirin, atrial fibrillation, diastolic congestive heart failure.  Per chart review patient lives with spouse.  Independent still active.  Two-level home with bedroom on [address] and 2 steps to entry.  Presented 08/09/2017 with left-sided weakness facial droop and slurred speech.  Cranial CT scan negative.    CT angiogram of head and neck showed mild less than 50% proximal right ICA stenosis and moderate to severe 70% proximal left ICA stenosis.  MRI showed subcentimeter acute versus early subacute infarction within the right posterior limb of internal capsule.  Echocardiogram with ejection fraction of 15% grade 1 diastolic dysfunction.  Currently on aspirin and Plavix for CVA prophylaxis.  Subcutaneous Lovenox for DVT prophylaxis.  Await plan for formal swallow study.  Physical therapy evaluation completed with recommendations of physical medicine rehab consult.   Review of Systems  Constitutional: Negative for chills and fever.  HENT: Negative for hearing loss.   Eyes: Negative for blurred vision and double vision.  Respiratory: Negative for cough and shortness of breath.   Cardiovascular: Positive for palpitations.  Negative for chest pain and leg swelling.  Gastrointestinal: Positive for constipation. Negative for nausea and vomiting.  Genitourinary: Positive for urgency. Negative for dysuria and hematuria.  Musculoskeletal: Negative for joint pain and myalgias.  Skin: Negative for rash.  Neurological: Positive for speech change, focal weakness and seizures.  All other systems reviewed and are negative.      Past Medical History:  Diagnosis Date  . Atrial fibrillation ([place])    Post operative  . CAD (coronary artery disease)    s/p cabg  . CHF (congestive heart failure) ([place])   . Diverticulosis   . DJD (degenerative joint disease)   . DM (diabetes mellitus) ([place])   . History of anemia   . History of stroke   . Hyperlipidemia   . Hypertension   . 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 Peripheral vascular disease ([place])   . Polyneuropathy, diabetic ([place])   . Seizure disorder, complex partial ([place])   . Syncope and collapse         Past Surgical History:  Procedure Laterality Date  . CATARACT EXTRACTION W/ INTRAOCULAR LENS  IMPLANT, BILATERAL  06/2013   Dr. [name]  . CORONARY ARTERY BYPASS GRAFT  02/13/2004   4 vesel  . removal of sebaceous cyst from neck  08/2007   Dr. [name]  . TONSILLECTOMY          Family History  Problem Relation Age of Onset  . Epilepsy Father   . Stroke Mother   . Lung cancer Unknown    Social History:  reports that he quit smoking about  40 years ago. His smoking use included cigarettes. He has a 22.00 pack-year smoking history. He has never used smokeless tobacco. He reports that he does not drink alcohol or use drugs. Allergies:      Allergies  Allergen Reactions  . Lisinopril Cough         Medications Prior to Admission  Medication Sig Dispense Refill  . allopurinol (ZYLOPRIM) 300 MG tablet Take 1 tablet (300 mg total) by mouth daily. 90 tablet 4  . aspirin 325 MG tablet Take 325 mg by mouth  daily.    Marland Kitchen atorvastatin (LIPITOR) 40 MG tablet Take 1 tablet (40 mg total) by mouth daily. 90 tablet 1  . Coenzyme Q10 (COQ10) 30 MG CAPS Take 1 tablet by mouth daily.      . divalproex (DEPAKOTE ER) 500 MG 24 hr tablet Take 1 tablet (500 mg total) by mouth daily. 30 tablet 11  . ezetimibe (ZETIA) 10 MG tablet Take 1 tablet (10 mg total) by mouth daily. 90 tablet 3  . furosemide (LASIX) 40 MG tablet TAKE 1 TABLET BY MOUTH EVERY DAY AS NEEDED FOR FLUID RETENTION (Patient taking differently: TAKE 1 TABLET (40mg ) BY MOUTH EVERY DAY AS NEEDED FOR FLUID RETENTION) 90 tablet 1  . glimepiride (AMARYL) 4 MG tablet Take 1 tablet (4 mg total) by mouth daily with breakfast. 90 tablet 3  . Insulin Glargine (LANTUS SOLOSTAR) 100 UNIT/ML Solostar Pen Inject 10-20 Units into the skin daily at 10 pm. (Patient taking differently: Inject 10 Units into the skin daily at 10 pm. ) 15 mL 11  . ipratropium (ATROVENT) 0.03 % nasal spray USE 2 SPRAYS IN EACH NOSTRIL EVERY 12 HOURS 30 mL 3  . losartan (COZAAR) 25 MG tablet Take 1 tablet (25 mg total) by mouth daily. 90 tablet 1  . metFORMIN (GLUCOPHAGE) 1000 MG tablet Take 1 tablet (1,000 mg total) by mouth 2 (two) times daily with a meal. 180 tablet 3  . metoprolol succinate (TOPROL-XL) 50 MG 24 hr tablet TAKE 1 TABLET BY MOUTH EVERY DAY (Patient taking differently: TAKE 1 TABLET (50mg ) BY MOUTH EVERY DAY) 90 tablet 1  . niacin (NIASPAN) 500 MG CR tablet Take 1 tablet (500 mg total) by mouth at bedtime. 90 tablet 3  . sitaGLIPtin (JANUVIA) 100 MG tablet Take 100 mg by mouth daily.    . celecoxib (CELEBREX) 200 MG capsule One to 2 tablets by mouth daily as needed for pain. (Patient not taking: Reported on 08/09/2017) 60 capsule 2  . Hyaluronan (ORTHOVISC) 30 MG/2ML SOSY Inject 1 Syringe into the articular space once a week. (Patient not taking: Reported on 08/09/2017) 8 Syringe 0  . linagliptin (TRADJENTA) 5 MG TABS tablet Take 1 tablet (5 mg total) by mouth daily.  insuranc would not cover [place] (Patient not taking: Reported on 08/09/2017) 30 tablet 5    Home: [place] expects to be discharged to:: Private residence Living Arrangements: Spouse/significant other, Children Available Help at Discharge: Family, Available 24 hours/day Type of Home: House Home Access: Stairs to enter [employer] of Steps: 2 Entrance Stairs-Rails: None Home Layout: Two level, Able to live on main level with bedroom/bathroom Bathroom Shower/Tub: Tub/shower unit, Architectural technologist: Standard Home Equipment: Cane - quad, Bedside commode, Grab bars - tub/shower, Hand held shower head, Tub bench  Functional History: Prior Function Level of Independence: Independent Comments: Was still very active at home, performing ADLs, IADLs, chopping wood, etc.  Functional Status:  Mobility: Bed Mobility Overal bed mobility:  Needs Assistance Bed Mobility: Supine to Sit, Sit to Supine Supine to sit: Max assist Sit to supine: Max assist General bed mobility comments: Pt requiring max A for assist with moving LEs and trunk elevation to come up to sitting. Increased L lateral lean in sitting and required from min to mod A to maintain balance. Max A to return to supine.  Transfers General transfer comment: Deferred   ADL:  Cognition: Cognition Overall Cognitive Status: Within Functional Limits for tasks assessed Orientation Level: Oriented X4 Cognition Arousal/Alertness: Awake/alert Behavior During Therapy: WFL for tasks assessed/performed Overall Cognitive Status: Within Functional Limits for tasks assessed  Blood pressure (!) 153/76, pulse 85, temperature 98.3 F (36.8 C), temperature source Oral, resp. rate 18, height 5\' 3"  (1.6 m), weight 84.4 kg (186 lb), SpO2 95 %. Physical Exam  Constitutional: He is oriented to person, place, and time. He appears well-developed and well-nourished.  HENT:  Head: Normocephalic.  edentulous  Eyes:  Pupils are equal, round, and reactive to light.  Neck: Normal range of motion.  Cardiovascular: Normal rate.  Respiratory: Effort normal.  GI: Soft.  Musculoskeletal: He exhibits no tenderness.  Neurological: He is alert and oriented to person, place, and time.  Dense left upper and lower ext weakness (0/5). RUE and RLE 5/5. Sensation intact. Left central 7 and tongue deviation.   Psychiatric: He has a normal mood and affect. His behavior is normal. Judgment and thought content normal.             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 Does the need for close, 24 hr/day medical supervision in concert with the patient's rehab needs make it unreasonable for this patient to be served in a less intensive setting? Yes 2. Co-Morbidities requiring supervision/potential complications: HTN, CHF, CAD s/p CABG, dm 2  3. Due to bladder management, bowel management, safety, skin/wound care, disease management, medication administration, pain management and patient education, does the patient require 24 hr/day rehab nursing? Yes 4. Does the patient require coordinated care of a physician, rehab nurse, PT (1-2 hrs/day, 5 days/week), OT (1-2 hrs/day, 5 days/week) and SLP (1-2 hrs/day, 5 days/week) to address physical and functional deficits in the context of the above medical diagnosis(es)? Yes Addressing deficits in the following areas: balance, endurance, locomotion, strength, transferring, bowel/bladder control, bathing, dressing, feeding, grooming, toileting, speech, swallowing and psychosocial support 5. Can the patient actively participate in an intensive therapy program of at least 3 hrs of therapy per day at least 5 days per week? Yes 6. The potential for patient to make measurable gains while on inpatient rehab is excellent 7. Anticipated functional outcomes upon discharge from inpatient rehab are supervision and min assist  with PT, supervision and min assist  with OT, modified independent with SLP. 8. Estimated rehab length of stay to reach the above functional goals is: 20-28 days 9. Anticipated D/C setting: Home 10. Anticipated post D/C treatments: HH therapy and Outpatient therapy 11. Overall Rehab/Functional Prognosis: excellent  RECOMMENDATIONS: This patient's condition is appropriate for continued rehabilitative care in the following setting: CIR Patient has agreed to participate in recommended program. Yes Note that insurance prior authorization may be required for reimbursement for recommended care.  Comment: Rehab Admissions Coordinator to follow up.  Thanks,  Meredith Staggers, MD, Mellody Drown  I have personally performed a face to face diagnostic evaluation of this patient. Additionally, I have reviewed and concur with the physician assistant's documentation above.      Lavon Paganini  [place], PA-C 08/11/2017          Revision History                        Routing History

## 2017-08-11 NOTE — Evaluation (Signed)
Occupational Therapy Evaluation Patient Details Name: Fred [name] MRN: [id number] DOB: June 11, 1935 Today's Date: 08/11/2017    History of Present Illness Pt is an 82 y/o male admitted secondary to L sided weakness. Imaging revealed acute/early subacute infarct in the R posterior limb of the internal capsule. PMH includes CAD s/p CABG, TIA, HTN, a fib, CHF, and DM.    Clinical Impression   This 82 y/o male presents with the above. At baseline pt is independent with ADLs, iADLs and functional mobility, was very active per family/pt. Pt presenting with significant L UE/LE weakness this session including L lateral lean during static sitting and standing. Pt requiring ModA+2 for bed mobility, MaxA+2 for sit<>stand from EOB, and MaxA+2 with use of Stedy to complete additional sit<>stand and transfer to recliner this session. Pt requiring ModA for UB ADLs, Max-totalA (+2) for LB ADLs given current functional deficits. Pt is very motivated to participate with therapy to progress to PLOF and appears to have very supportive family. Feel Pt is an excellent candidate for CIR level services to maximize his overall safety and independence with ADLs and functional mobility prior to return home. Will continue to follow acutely to progress pt towards established OT goals.     Follow Up Recommendations  CIR;Supervision/Assistance - 24 hour    Equipment Recommendations  Other (comment)(TBD in next venue)           Precautions / Restrictions Precautions Precautions: Fall Restrictions Weight Bearing Restrictions: No      Mobility Bed Mobility Overal bed mobility: Needs Assistance Bed Mobility: Supine to Sit     Supine to sit: Mod assist;+2 for physical assistance;+2 for safety/equipment;HOB elevated     General bed mobility comments: moving to R side of bed, assist for LLE and to scoot hips towards EOB with use of bed pad, pt using RUE and bedrail to assist, almost able to fully bring trunk  upright into sitting, requires assist to fully achieve upright position   Transfers Overall transfer level: Needs assistance Equipment used: 2 person hand held assist;Ambulation equipment used Transfers: Sit to/from [facility] Sit to Stand: Max assist;+2 physical assistance;+2 safety/equipment Stand pivot transfers: Total assist;+2 physical assistance;+2 safety/equipment       General transfer comment: MaxA+2 initially with +2 HHA to rise and steady with L knee blocked, verbal cues for bringing trunk upright and to maintain midline as pt tends to lean L; completed additional sit<>stand at [facility] with MaxA+2, use of Stedy to transfer to recliner     Balance Overall balance assessment: Needs assistance Sitting-balance support: Bilateral upper extremity supported;Feet supported Sitting balance-Leahy Scale: Poor Sitting balance - Comments: L lateral lean in sitting. Required min to mod A for sitting balance, verbal cues to correct posture  Postural control: Left lateral lean   Standing balance-Leahy Scale: Zero                             ADL either performed or assessed with clinical judgement   ADL Overall ADL's : Needs assistance/impaired Eating/Feeding: Minimal assistance;Sitting   Grooming: Minimal assistance;Moderate assistance;Sitting;Wash/dry face Grooming Details (indicate cue type and reason): min-modA for sitting balance EOB while pt wiping face Upper Body Bathing: Moderate assistance;Sitting   Lower Body Bathing: Maximal assistance;+2 for physical assistance;+2 for safety/equipment;Sit to/from stand   Upper Body Dressing : Moderate assistance;Sitting   Lower Body Dressing: Maximal assistance;+2 for physical assistance;+2 for safety/equipment;Sit to/from stand   Toilet Transfer: Maximal  assistance;+2 for physical assistance;+2 for safety/equipment;Stand-pivot;BSC Toilet Transfer Details (indicate cue type and reason): simulated in transfer to  recliner; use of Stedy for transfers this session  Toileting- Clothing Manipulation and Hygiene: Maximal assistance;+2 for physical assistance;+2 for safety/equipment;Sit to/from stand       Functional mobility during ADLs: Maximal assistance;+2 for physical assistance;+2 for safety/equipment(use of Stedy for stand pivot transfer ) General ADL Comments: pt completing sit<>stand at EOB, additional sit<>stand at [facility] with maxA+2, pt able to maintain standing approx 1-2 min prior to sitting at [facility] with MaxA+2 to maintain standing balance, cues to lean R due to strong L lateral lean      Vision Baseline Vision/History: Wears glasses Wears Glasses: At all times Patient Visual Report: No change from baseline Vision Assessment?: Yes Eye Alignment: Within Functional Limits Ocular Range of Motion: Within Functional Limits Alignment/Gaze Preference: Within Defined Limits Tracking/Visual Pursuits: Able to track stimulus in all quads without difficulty;Other (comment)(pt appears to have increased difficulty maintaining gaze to L visual field ) Additional Comments: pt attending to L side of body during mobility tasks                 Pertinent Vitals/Pain Pain Assessment: No/denies pain     Hand Dominance Right   Extremity/Trunk Assessment Upper Extremity Assessment Upper Extremity Assessment: LUE deficits/detail LUE Deficits / Details: flaccid LUE, pt denies numbness/tingling  LUE Coordination: decreased fine motor;decreased gross motor   Lower Extremity Assessment Lower Extremity Assessment: Defer to PT evaluation   Cervical / Trunk Assessment Cervical / Trunk Assessment: Other exceptions Cervical / Trunk Exceptions: L lateral lean in sitting; forward head   Communication Communication Communication: HOH   Cognition Arousal/Alertness: Awake/alert Behavior During Therapy: WFL for tasks assessed/performed Overall Cognitive Status: Impaired/Different from baseline Area of  Impairment: Awareness;Following commands                       Following Commands: Follows one step commands consistently   Awareness: Emergent       General Comments  pt with very supportive family present at start of session                [place] expects to be discharged to:: Private residence Living Arrangements: Spouse/significant other;Children Available Help at Discharge: Family;Available 24 hours/day Type of Home: House Home Access: Stairs to enter [employer] of Steps: 2 Entrance Stairs-Rails: None Home Layout: Two level;Able to live on main level with bedroom/bathroom     Bathroom Shower/Tub: Tub/shower unit;Curtain   Biochemist, clinical: Standard     Home Equipment: Cane - quad;Bedside commode;Grab bars - tub/shower;Hand held shower head;Tub bench          Prior Functioning/Environment Level of Independence: Independent        Comments: Was still very active at home, performing ADLs, IADLs, chopping wood, etc.         OT Problem List: Decreased strength;Impaired balance (sitting and/or standing);Decreased range of motion;Decreased activity tolerance;Impaired UE functional use      OT Treatment/Interventions: Self-care/ADL training;DME and/or AE instruction;Therapeutic activities;Balance training;Therapeutic exercise;Patient/family education    OT Goals(Current goals can be found in the care plan section) Acute Rehab OT Goals Patient Stated Goal: to regain independence, and use of L side  OT Goal Formulation: With patient Time For Goal Achievement: 08/25/17 Potential to Achieve Goals: Good  OT Frequency: Min 3X/week               Co-evaluation PT/OT/SLP Co-Evaluation/Treatment: Yes  Reason for Co-Treatment: For patient/therapist safety;To address functional/ADL transfers;Complexity of the patient's impairments (multi-system involvement) PT goals addressed during session: Mobility/safety with mobility OT  goals addressed during session: Proper use of Adaptive equipment and DME;Strengthening/ROM      AM-PAC PT "6 Clicks" Daily Activity     Outcome Measure Help from another person eating meals?: A Little Help from another person taking care of personal grooming?: A Lot Help from another person toileting, which includes using toliet, bedpan, or urinal?: Total Help from another person bathing (including washing, rinsing, drying)?: A Lot Help from another person to put on and taking off regular upper body clothing?: A Lot Help from another person to put on and taking off regular lower body clothing?: Total 6 Click Score: 11   End of Session Equipment Utilized During Treatment: Gait belt Nurse Communication: Mobility status  Activity Tolerance: Patient tolerated treatment well Patient left: in chair;with call bell/phone within reach;with family/visitor present  OT Visit Diagnosis: Hemiplegia and hemiparesis;Muscle weakness (generalized) (M62.81);Unsteadiness on feet (R26.81) Hemiplegia - Right/Left: Left Hemiplegia - dominant/non-dominant: Non-Dominant Hemiplegia - caused by: Cerebral infarction                Time: 1117-3567 OT Time Calculation (min): 34 min Charges:  OT General Charges $OT Visit: 1 Visit OT Evaluation $OT Eval Moderate Complexity: 1 Mod G-Codes:     Lou Cal, OT Pager [phone] 08/11/2017   Raymondo Band 08/11/2017, 12:57 PM

## 2017-08-11 NOTE — Care Management Note (Signed)
Case Management Note  Patient Details  Name: Fred [name] MRN: [id number] Date of Birth: 1936-01-21  Subjective/Objective:      Pt admitted with CVA. He is from home with spouse.              Action/Plan: Pt discharging to CIR today. CM signing off.   Expected Discharge Date:  08/11/17               Expected Discharge Plan:  [place]  In-House Referral:     Discharge planning Services  CM Consult  Post Acute Care Choice:    Choice offered to:     DME Arranged:    DME Agency:     HH Arranged:    HH Agency:     Status of Service:  Completed, signed off  If discussed at [employer], dates discussed:    Additional Comments:  Pollie Friar, RN 08/11/2017, 3:59 PM

## 2017-08-11 NOTE — H&P (Signed)
Physical Medicine and Rehabilitation Admission H&P     Chief Complaint  Patient presents with  . Code Stroke  :  HPI: Fred [name] is an 82 year old right-handed male with history of alcohol use, hypertension, seizure maintained on Depakote as well as history of CVA with little residual January 2019 and did receive TPA. CAD with CABG maintained on aspirin, transient atrial fibrillation, diastolic congestive heart failure. Per chart review patient lives with spouse. Independent and active. Two-level home with bedroom on Main level and 2 steps to entry. Excellent local family support. Presented 08/09/2017 with left-sided weakness and facial droop as well as slurred speech. Cranial CT scan negative. CT angiogram of head and neck showed mild less than 50% proximal right ICA stenosis and moderate to severe 70% proximal left ICA stenosis. MRI showed subcentimeter acute versus early subacute infarction within the right posterior limb of internal capsule. Echocardiogram with ejection fraction of 98% grade 1 diastolic dysfunction. Currently maintained on aspirin and Plavix for CVA prophylaxis x3 weeks then aspirin alone. Subcutaneous Lovenox for DVT prophylaxis. Dysphasia #2 nectar thick liquid diet. Physical and occupational therapy evaluations completed with recommendations of physical medicine rehab consult. Patient was admitted for a comprehensive rehab program.  Review of Systems  Constitutional: Negative for chills and fever.  HENT: Negative for hearing loss.  Eyes: Negative for blurred vision and double vision.  Respiratory: Negative for cough and shortness of breath.  Cardiovascular: Positive for palpitations.  Gastrointestinal: Positive for constipation. Negative for nausea and vomiting.  Genitourinary: Positive for urgency. Negative for flank pain and hematuria.  Musculoskeletal: Positive for myalgias.  Skin: Negative for rash.  Neurological: Positive for speech change, focal weakness and  seizures.  All other systems reviewed and are negative.       Past Medical History:  Diagnosis Date  . Atrial fibrillation ([place])    Post operative  . CAD (coronary artery disease)    s/p cabg  . CHF (congestive heart failure) ([place])   . Diverticulosis   . DJD (degenerative joint disease)   . DM (diabetes mellitus) ([place])   . History of anemia   . History of stroke   . Hyperlipidemia   . Hypertension   . 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 Peripheral vascular disease ([place])   . Polyneuropathy, diabetic ([place])   . Seizure disorder, complex partial ([place])   . Syncope and collapse         Past Surgical History:  Procedure Laterality Date  . CATARACT EXTRACTION W/ INTRAOCULAR LENS IMPLANT, BILATERAL  06/2013   Dr. [name]  . CORONARY ARTERY BYPASS GRAFT  02/13/2004   4 vesel  . removal of sebaceous cyst from neck  08/2007   Dr. [name]  . TONSILLECTOMY          Family History  Problem Relation Age of Onset  . Epilepsy Father   . Stroke Mother   . Lung cancer Unknown    Social History: reports that he quit smoking about 40 years ago. His smoking use included cigarettes. He has a 22.00 pack-year smoking history. He has never used smokeless tobacco. He reports that he does not drink alcohol or use drugs.  Allergies:      Allergies  Allergen Reactions  . Lisinopril Cough         Medications Prior to Admission  Medication Sig Dispense Refill  . allopurinol (ZYLOPRIM) 300 MG tablet Take 1 tablet (300 mg total) by mouth daily. 90 tablet 4  .  aspirin 325 MG tablet Take 325 mg by mouth daily.    Marland Kitchen atorvastatin (LIPITOR) 40 MG tablet Take 1 tablet (40 mg total) by mouth daily. 90 tablet 1  . Coenzyme Q10 (COQ10) 30 MG CAPS Take 1 tablet by mouth daily.     . divalproex (DEPAKOTE ER) 500 MG 24 hr tablet Take 1 tablet (500 mg total) by mouth daily. 30 tablet 11  . ezetimibe (ZETIA) 10 MG tablet Take 1 tablet (10 mg total) by mouth daily. 90 tablet 3  .  furosemide (LASIX) 40 MG tablet TAKE 1 TABLET BY MOUTH EVERY DAY AS NEEDED FOR FLUID RETENTION (Patient taking differently: TAKE 1 TABLET (40mg ) BY MOUTH EVERY DAY AS NEEDED FOR FLUID RETENTION) 90 tablet 1  . glimepiride (AMARYL) 4 MG tablet Take 1 tablet (4 mg total) by mouth daily with breakfast. 90 tablet 3  . Insulin Glargine (LANTUS SOLOSTAR) 100 UNIT/ML Solostar Pen Inject 10-20 Units into the skin daily at 10 pm. (Patient taking differently: Inject 10 Units into the skin daily at 10 pm. ) 15 mL 11  . ipratropium (ATROVENT) 0.03 % nasal spray USE 2 SPRAYS IN EACH NOSTRIL EVERY 12 HOURS 30 mL 3  . losartan (COZAAR) 25 MG tablet Take 1 tablet (25 mg total) by mouth daily. 90 tablet 1  . metFORMIN (GLUCOPHAGE) 1000 MG tablet Take 1 tablet (1,000 mg total) by mouth 2 (two) times daily with a meal. 180 tablet 3  . metoprolol succinate (TOPROL-XL) 50 MG 24 hr tablet TAKE 1 TABLET BY MOUTH EVERY DAY (Patient taking differently: TAKE 1 TABLET (50mg ) BY MOUTH EVERY DAY) 90 tablet 1  . niacin (NIASPAN) 500 MG CR tablet Take 1 tablet (500 mg total) by mouth at bedtime. 90 tablet 3  . sitaGLIPtin (JANUVIA) 100 MG tablet Take 100 mg by mouth daily.    . celecoxib (CELEBREX) 200 MG capsule One to 2 tablets by mouth daily as needed for pain. (Patient not taking: Reported on 08/09/2017) 60 capsule 2  . Hyaluronan (ORTHOVISC) 30 MG/2ML SOSY Inject 1 Syringe into the articular space once a week. (Patient not taking: Reported on 08/09/2017) 8 Syringe 0  . linagliptin (TRADJENTA) 5 MG TABS tablet Take 1 tablet (5 mg total) by mouth daily. insuranc would not cover [place] (Patient not taking: Reported on 08/09/2017) 30 tablet 5   Drug Regimen Review  Drug regimen was reviewed and remains appropriate with no significant issues identified  Home:  Home Living  Family/patient expects to be discharged to:: Private residence  Living Arrangements: Spouse/significant other, Children  Available Help at Discharge: Family,  Available 24 hours/day  Type of Home: House  Home Access: Stairs to enter  [employer] of Steps: 2  Entrance Stairs-Rails: None  Home Layout: Two level, Able to live on main level with bedroom/bathroom  Bathroom Shower/Tub: Tub/shower unit, Artist: Standard  Home Equipment: Cane - quad, Bedside commode, Grab bars - tub/shower, Hand held shower head, Tub bench  Functional History:  Prior Function  Level of Independence: Independent  Comments: Was still very active at home, performing ADLs, IADLs, chopping wood, etc.  Functional Status:  Mobility:  Bed Mobility  Overal bed mobility: Needs Assistance  Bed Mobility: Supine to Sit, Sit to Supine  Supine to sit: Max assist  Sit to supine: Max assist  General bed mobility comments: Pt requiring max A for assist with moving LEs and trunk elevation to come up to sitting. Increased L lateral lean in sitting and  required from min to mod A to maintain balance. Max A to return to supine.  Transfers  General transfer comment: Deferred    ADL:  ADL  Overall ADL's : Needs assistance/impaired  Eating/Feeding: Minimal assistance, Sitting  Grooming: Minimal assistance, Moderate assistance, Sitting, Wash/dry face  Grooming Details (indicate cue type and reason): min-modA for sitting balance EOB while pt wiping face  Upper Body Bathing: Moderate assistance, Sitting  Lower Body Bathing: Maximal assistance, +2 for physical assistance, +2 for safety/equipment, Sit to/from stand  Upper Body Dressing : Moderate assistance, Sitting  Lower Body Dressing: Maximal assistance, +2 for physical assistance, +2 for safety/equipment, Sit to/from stand  Toilet Transfer: Maximal assistance, +2 for physical assistance, +2 for safety/equipment, Stand-pivot, BSC  Toilet Transfer Details (indicate cue type and reason): simulated in transfer to recliner; use of Stedy for transfers this session  Toileting- Clothing Manipulation and Hygiene:  Maximal assistance, +2 for physical assistance, +2 for safety/equipment, Sit to/from stand  Functional mobility during ADLs: Maximal assistance, +2 for physical assistance, +2 for safety/equipment(use of Stedy for stand pivot transfer )  General ADL Comments: pt completing sit<>stand at EOB, additional sit<>stand at [facility] with maxA+2, pt able to maintain standing approx 1-2 min prior to sitting at [facility] with MaxA+2 to maintain standing balance, cues to lean R due to strong L lateral lean  Cognition:  Cognition  Overall Cognitive Status: Impaired/Different from baseline  Orientation Level: Oriented X4  Cognition  Arousal/Alertness: Awake/alert  Behavior During Therapy: WFL for tasks assessed/performed  Overall Cognitive Status: Impaired/Different from baseline  Area of Impairment: Awareness, Following commands  Following Commands: Follows one step commands consistently  Awareness: Emergent  Physical Exam:  Blood pressure 136/78, pulse 99, temperature 97.9 F (36.6 C), temperature source Oral, resp. rate 18, height 5\' 3"  (1.6 m), weight 84.4 kg (186 lb), SpO2 93 %.  Physical Exam  Constitutional: He is oriented to person, place, and time. He appears well-developed and well-nourished.  HENT:  Head: Normocephalic.  Eyes: Pupils are equal, round, and reactive to light.  Neck: Normal range of motion. No tracheal deviation present. No thyromegaly present.  Cardiovascular: Normal rate and regular rhythm. Exam reveals no friction rub.  No murmur heard.  Respiratory: Effort normal. No respiratory distress. He has no wheezes.  GI: Soft. He exhibits no distension. There is no tenderness.  Musculoskeletal: He exhibits no edema or deformity.  Neurological: He is alert and oriented to person, place, and time.  Follows full commands. Fair awareness of deficits. Mood is appropriate.left central 7 and tongue deviation. LUE 0/5. LLE 0/5. RUE and RLE 4-5/5. Sensation intact. Normal insight and awareness.  Language intact.  Skin: Skin is warm and dry.  Psychiatric: He has a normal mood and affect. His behavior is normal.   Lab Results Last 48 Hours  Imaging Results (Last 48 hours)     Medical Problem List and Plan:  1. Dense left-sided weakness with dysarthria and dysphasia secondary to right PLIC infarction. Aspirin and Plavix x3 weeks then aspirin alone  -admit to inpatient rehab  2. DVT Prophylaxis/Anticoagulation: Subcutaneous Lovenox  3. Pain Management: Tylenol as needed  4. Mood: Provide emotional support  5. Neuropsych: This patient is capable of making decisions on his own behalf.  6. Skin/Wound Care: Routine skin  checks  7. Fluids/Electrolytes/Nutrition: Routine in and outs with follow-up chemistries  8. CAD with CABG. Continue aspirin and Plavix. No chest pain or shortness of breath  9. Diabetes mellitus with peripheral neuropathy. Hemoglobin A1c 6.0. SSI. Check blood sugars before meals and at bedtime. Glucophage 1000 mg twice daily, Amaryl 4 mg daily, Lantus insulin 10 units nightly, Tradjenta 5 mg daily  10. Dysphagia. Dysphasia #2 nectar liquids. Follow-up speech therapy  11. Hypertension. Cozaar 25 mg daily, Toprol 50 mg daily  12. Diastolic congestive heart failure. Monitor for any signs of fluid overload. Patient was taking Lasix 40 mg daily if needed for fluid retention prior to admission.   -daily weights 13. History of seizure disorder. Depakote 500 mg daily.  14. Hyperlipidemia. Lipitor  15. History of alcohol use. Monitor for withdrawal    Post Admission Physician Evaluation:  1. Functional deficits secondary to right PLIC infarct. 2. Patient is admitted to receive collaborative, interdisciplinary care between the physiatrist, rehab nursing staff, and therapy team. 3. Patient's level of medical complexity and substantial therapy needs in context of that medical necessity cannot be provided at a lesser intensity of care such as a SNF. 4. Patient has experienced substantial functional loss from his/her baseline which was documented above under the "Functional History" and "Functional Status" headings. Judging by the patient's diagnosis, physical exam, and functional history, the patient has potential for functional progress which will result in measurable gains while on inpatient rehab. These gains will be of substantial and practical use upon discharge in facilitating mobility and self-care at the household level. 5. Physiatrist will provide 24 hour management of medical needs as well as oversight of the therapy plan/treatment and provide guidance as appropriate regarding the interaction of the  two. 6. The Preadmission Screening has been reviewed and patient status is unchanged unless otherwise stated above. 7. 24 hour rehab nursing will assist with bladder management, bowel management, safety, skin/wound care, disease management, medication administration, pain management and patient education and help integrate therapy concepts, techniques,education, etc. 8. PT will assess and treat for/with: Lower extremity strength, range of motion, stamina, balance, functional mobility, safety, adaptive techniques and equipment, NMR, family education, orthotic. Goals are: min assist . 9. OT will assess and treat for/with: ADL's, functional mobility, safety, upper extremity strength, adaptive techniques and equipment, NMR, family education, orthotics. Goals are: min assist. Therapy may proceed with showering this patient. 10. SLP will assess and treat for/with: speech, communication. Goals are: mod I. 11. Case Management and Social Worker will assess and treat for psychological issues and discharge planning. 12. Team conference will be held weekly to assess progress toward goals and to determine barriers to discharge. 13. Patient will receive at least 3 hours of therapy per day at least 5 days per week. 14. ELOS: 20-28 days  15. Prognosis: excellent   I have personally performed a face to face diagnostic evaluation of this patient and formulated the key components of the plan. Additionally, I have personally reviewed laboratory data, imaging studies, as well  as relevant notes and concur with the physician assistant's documentation above.  Meredith Staggers, MD, FAAPMR  Lavon Paganini [place], PA-C  08/11/2017

## 2017-08-11 NOTE — Progress Notes (Signed)
Physical Therapy Treatment Patient Details Name: Fred [name] MRN: [id number] DOB: 02-Jun-1935 Today's Date: 08/11/2017    History of Present Illness Pt is an 82 y/o male admitted secondary to L sided weakness. Imaging revealed acute/early subacute infarct in the R posterior limb of the internal capsule. PMH includes CAD s/p CABG, TIA, HTN, a fib, CHF, and DM.     PT Comments    Patient is pleasant and eager to participate in therapy. Pt is making progress toward mobility goals and has very supportive family present. +2 assist for all mobility due to L side weakness. Continue to progress as tolerated.    Follow Up Recommendations  CIR;Supervision for mobility/OOB     Equipment Recommendations  None recommended by PT    Recommendations for Other Services Rehab consult     Precautions / Restrictions Precautions Precautions: Fall Restrictions Weight Bearing Restrictions: No    Mobility  Bed Mobility Overal bed mobility: Needs Assistance Bed Mobility: Supine to Sit     Supine to sit: Mod assist;+2 for physical assistance;+2 for safety/equipment;HOB elevated     General bed mobility comments: moving to R side of bed, assist for LLE and to scoot hips towards EOB with use of bed pad, pt using RUE and bedrail to assist, almost able to fully bring trunk upright into sitting, requires assist to fully achieve upright position   Transfers Overall transfer level: Needs assistance Equipment used: 2 person hand held assist;Ambulation equipment used Transfers: Sit to/from [facility] Sit to Stand: Max assist;+2 physical assistance;+2 safety/equipment Stand pivot transfers: Total assist;+2 physical assistance;+2 safety/equipment       General transfer comment: MaxA+2 initially with +2 HHA to rise and steady with L knee blocked, verbal cues for bringing trunk upright and to maintain midline as pt tends to lean L; completed additional sit<>stand at [facility] with MaxA+2,  use of Stedy to transfer to recliner   Ambulation/Gait                 Stairs             Wheelchair Mobility    Modified Rankin (Stroke Patients Only)       Balance Overall balance assessment: Needs assistance Sitting-balance support: Bilateral upper extremity supported;Feet supported Sitting balance-Leahy Scale: Poor Sitting balance - Comments: L lateral lean in sitting. Required min to mod A for sitting balance, verbal cues to correct posture  Postural control: Left lateral lean   Standing balance-Leahy Scale: Zero                              Cognition Arousal/Alertness: Awake/alert Behavior During Therapy: WFL for tasks assessed/performed Overall Cognitive Status: Impaired/Different from baseline Area of Impairment: Awareness;Following commands                       Following Commands: Follows one step commands consistently   Awareness: Emergent          Exercises      General Comments General comments (skin integrity, edema, etc.): pt with very supportive family present at start of session       Pertinent Vitals/Pain Pain Assessment: No/denies pain    Home Living Family/patient expects to be discharged to:: Private residence Living Arrangements: Spouse/significant other;Children Available Help at Discharge: Family;Available 24 hours/day Type of Home: House Home Access: Stairs to enter Entrance Stairs-Rails: None Home Layout: Two level;Able to live on main level with  bedroom/bathroom Home Equipment: Cane - quad;Bedside commode;Grab bars - tub/shower;Hand held shower head;Tub bench      Prior Function Level of Independence: Independent      Comments: Was still very active at home, performing ADLs, IADLs, chopping wood, etc.    PT Goals (current goals can now be found in the care plan section) Acute Rehab PT Goals Patient Stated Goal: to regain independence, and use of L side  Progress towards PT goals: Progressing  toward goals    Frequency    Min 4X/week      PT Plan Current plan remains appropriate    Co-evaluation PT/OT/SLP Co-Evaluation/Treatment: Yes Reason for Co-Treatment: Complexity of the patient's impairments (multi-system involvement);For patient/therapist safety;To address functional/ADL transfers PT goals addressed during session: Mobility/safety with mobility OT goals addressed during session: Proper use of Adaptive equipment and DME;Strengthening/ROM      AM-PAC PT "6 Clicks" Daily Activity  Outcome Measure  Difficulty turning over in bed (including adjusting bedclothes, sheets and blankets)?: Unable Difficulty moving from lying on back to sitting on the side of the bed? : Unable Difficulty sitting down on and standing up from a chair with arms (e.g., wheelchair, bedside commode, etc,.)?: Unable Help needed moving to and from a bed to chair (including a wheelchair)?: A Lot Help needed walking in hospital room?: Total Help needed climbing 3-5 steps with a railing? : Total 6 Click Score: 7    End of Session Equipment Utilized During Treatment: Gait belt Activity Tolerance: Patient tolerated treatment well Patient left: in chair;with call bell/phone within reach;with chair alarm set;with family/visitor present Nurse Communication: Mobility status PT Visit Diagnosis: Muscle weakness (generalized) (M62.81);Hemiplegia and hemiparesis;Difficulty in walking, not elsewhere classified (R26.2) Hemiplegia - Right/Left: Left Hemiplegia - caused by: Cerebral infarction     Time: 1610-9604 PT Time Calculation (min) (ACUTE ONLY): 34 min  Charges:  $Therapeutic Activity: 8-22 mins                    G Codes:       Earney Navy, PTA Pager: [phone]     Darliss Cheney 08/11/2017, 3:56 PM

## 2017-08-11 NOTE — Evaluation (Signed)
Clinical/Bedside Swallow Evaluation Patient Details  Name: Fred [name] MRN: [id number] Date of Birth: [birth year]  Today's Date: 08/11/2017 Time: SLP Start Time (ACUTE ONLY): 1012 SLP Stop Time (ACUTE ONLY): 1026 SLP Time Calculation (min) (ACUTE ONLY): 14 min  Past Medical History:  Past Medical History:  Diagnosis Date  . Atrial fibrillation ([place])    Post operative  . CAD (coronary artery disease)    s/p cabg  . CHF (congestive heart failure) ([place])   . Diverticulosis   . DJD (degenerative joint disease)   . DM (diabetes mellitus) ([place])   . History of anemia   . History of stroke   . Hyperlipidemia   . Hypertension   . 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 Peripheral vascular disease ([place])   . Polyneuropathy, diabetic ([place])   . Seizure disorder, complex partial ([place])   . Syncope and collapse    Past Surgical History:  Past Surgical History:  Procedure Laterality Date  . CATARACT EXTRACTION W/ INTRAOCULAR LENS  IMPLANT, BILATERAL  06/2013   Dr. [name]  . CORONARY ARTERY BYPASS GRAFT  02/13/2004   4 vesel  . removal of sebaceous cyst from neck  08/2007   Dr. [name]  . TONSILLECTOMY     HPI:  Pt is an 82 y/o male admitted secondary to L sided weakness. Imaging revealed acute/early subacute infarct in the R posterior limb of the internal capsule. PMH includes CAD s/p CABG, TIA, HTN, a fib, CHF, and DM.    Assessment / Plan / Recommendation Clinical Impression  Pt presents with focal CN deficits on left specific to CN V, VII with decreased sensation of anterior left spillage, left facial asymmetry, significant coughing associated with consumption of three oz thin liquids, concerning for aspiration.  Pt with no overt signs/symptoms of aspiration when consuming large, successive boluses of nectar. For today, recommend initiating a dysphagia 2 diet with nectar thick liquids.  Give meds whole in puree.  SLP will follow for dysphagia tx, pending  speech/language evaluation, and for determination of necessity of MBS.   SLP Visit Diagnosis: Dysphagia, unspecified (R13.10)    Aspiration Risk       Diet Recommendation   dysphagia 2, nectar thick liquids  Medication Administration: Whole meds with puree    Other  Recommendations Oral Care Recommendations: Oral care BID Other Recommendations: Order thickener from pharmacy   Follow up Recommendations Inpatient Rehab      Frequency and Duration min 2x/week  2 weeks       Prognosis Prognosis for Safe Diet Advancement: Good      Swallow Study   General Date of Onset: 08/11/17 HPI: Pt is an 82 y/o male admitted secondary to L sided weakness. Imaging revealed acute/early subacute infarct in the R posterior limb of the internal capsule. PMH includes CAD s/p CABG, TIA, HTN, a fib, CHF, and DM.  Type of Study: Bedside Swallow Evaluation Previous Swallow Assessment: none Diet Prior to this Study: NPO Temperature Spikes Noted: No Respiratory Status: Room air History of Recent Intubation: No Behavior/Cognition: Alert;Cooperative Oral Cavity Assessment: Within Functional Limits Oral Care Completed by SLP: Recent completion by staff Oral Cavity - Dentition: Edentulous Vision: Functional for self-feeding Self-Feeding Abilities: Able to feed self Patient Positioning: Upright in bed Baseline Vocal Quality: Normal Volitional Cough: Strong Volitional Swallow: Able to elicit    Oral/Motor/Sensory Function Overall Oral Motor/Sensory Function: Mild impairment Facial ROM: Reduced left;Suspected CN VII (facial) dysfunction Facial Symmetry: Abnormal symmetry left;Suspected CN  VII (facial) dysfunction Facial Strength: Reduced left;Suspected CN VII (facial) dysfunction Facial Sensation: Reduced left;Suspected CN V (Trigeminal) dysfunction Lingual Symmetry: Within Functional Limits Velum: Within Functional Limits Mandible: Within Functional Limits   Ice Chips Ice chips: Within functional  limits   Thin Liquid Thin Liquid: Impaired Presentation: Cup Oral Phase Impairments: Reduced labial seal Oral Phase Functional Implications: Left anterior spillage Pharyngeal  Phase Impairments: Cough - Immediate    Nectar Thick Nectar Thick Liquid: Impaired Presentation: Cup Oral Phase Impairments: Reduced labial seal Oral phase functional implications: Left anterior spillage   Honey Thick Honey Thick Liquid: Not tested   Puree Puree: Within functional limits   Solid   GO   Solid: Not tested        Fred [name] 08/11/2017,12:19 PM

## 2017-08-11 NOTE — Progress Notes (Signed)
Patient received at approximately 1730 via bed from 3 W. Patient alert and oriented x 4 . No complaints of pain. Left side weak. Patient and family oriented to room and call bell system. Patient and family verbalized understanding of rehab process. Safety protocol reviewed. Continue with plan of care.  Mliss Sax

## 2017-08-11 NOTE — Discharge Summary (Signed)
Physician Discharge Summary  PRINTICE HELLMER NWG:[id number] DOB: 08/23/1935 DOA: 08/09/2017  PCP: [name], MD  Admit date: 08/09/2017 Discharge date: 08/11/2017  Admitted From: home Disposition:  CIR  Recommendations for Outpatient Follow-up:  Discharge to inpatient rehab Continue aspirin and Plavix for 3 weeks, then aspirin alone per neurology recommendations  Home Health: None Equipment/Devices: None  Discharge Condition: Stable CODE STATUS: Full code Diet recommendation: Heart healthy  HPI: Per Dr. Lorin [name], Fred [name] is a 82 y.o. male with a Past Medical History of MRI-negative CVA vs. Complex partial seizure in 1/19; PVD; HTN; HLD; DM; CAD; afib; and CHF (normal EF and no diastolic dysfunction in 5/78) who presents with left-sided (UE, LE) weakness and facial droop.  Also with dysarthria.  Overnight MRI showed a subcentimeter/early subacute infarction in the right posterior limb of the internal capsule. Patient also acknowledged to Dr. Rory [name] overnight that he has been drinking heavily.  Exam shows left facial droop (fairly subtle) and LLE > LUE weakness that is not subtle.  Nurse called within the last few minutes to report progression of symptoms ongoing  Hospital Course: CVA/Stroke -patient was admitted to the hospital with concern for stroke.  Neurology was consulted and followed patient while hospitalized.  MRI on admission showed a subcentimeter acute/early acute infarction within the right posterior limb of the internal capsule without hemorrhage or mass-effect.  He had a CT angiogram of the head and neck without any acute significant obstructions.  Physical therapy evaluated patient and recommended inpatient rehab, and he will be discharged in stable condition.  He was placed on statin, dual antiplatelet therapy with aspirin and Plavix for 21 days followed by aspirin alone per neurology recommendations.  He will need follow-up with neurology as an outpatient in  6 weeks. Hyperlipidemia -continue statin Seizure -Continue home medication Depakote 500mg  qd Congestive heart failure -Mild LV dilation on echo with 55-60% EF.  Type 2 Diabetes -continue home regimen Alcohol Use -no apparent withdrawal, alcohol level 2 days ago was negative.  Continue multivitamin    Discharge Diagnoses:  Principal Problem:   Stroke (cerebrum) ([place]) Active Problems:   Hyperlipidemia   ANEMIA / OTHER   Partial epilepsy with impairment of consciousness (HCC)   Diabetic polyneuropathy (HCC)   Essential hypertension   PAROXYSMAL ATRIAL FIBRILLATION   Congestive heart failure (HCC)   History of gout   S/P CABG (coronary artery bypass graft)   Type 2 diabetes mellitus with complication, with long-term current use of insulin (HCC)   Primary osteoarthritis of both knees   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 of localized onset, not intractable, without status epilepticus ([place])   CVA (cerebral vascular accident) [facility])     Discharge Instructions   Allergies as of 08/11/2017      Reactions   Lisinopril Cough      Medication List    STOP taking these medications   allopurinol 300 MG tablet Commonly known as:  ZYLOPRIM   aspirin 325 MG tablet Replaced by:  aspirin 81 MG EC tablet     TAKE these medications   aspirin 81 MG EC tablet Take 1 tablet (81 mg total) by mouth daily. Start taking on:  08/12/2017 Replaces:  aspirin 325 MG tablet   atorvastatin 80 MG tablet Commonly known as:  LIPITOR Take 1 tablet (80 mg total) by mouth daily. Start taking on:  08/12/2017 What changed:    medication strength  how much to take   celecoxib 200  MG capsule Commonly known as:  CELEBREX One to 2 tablets by mouth daily as needed for pain.   clopidogrel 75 MG tablet Commonly known as:  PLAVIX Take 1 tablet (75 mg total) by mouth daily. Start taking on:  08/12/2017   CoQ10 30 MG Caps Take 1 tablet by mouth daily.     divalproex 500 MG 24 hr tablet Commonly known as:  DEPAKOTE ER Take 1 tablet (500 mg total) by mouth daily.   ezetimibe 10 MG tablet Commonly known as:  ZETIA Take 1 tablet (10 mg total) by mouth daily.   furosemide 40 MG tablet Commonly known as:  LASIX TAKE 1 TABLET BY MOUTH EVERY DAY AS NEEDED FOR FLUID RETENTION What changed:  See the new instructions.   glimepiride 4 MG tablet Commonly known as:  AMARYL Take 1 tablet (4 mg total) by mouth daily with breakfast.   Hyaluronan 30 MG/2ML Sosy Commonly known as:  ORTHOVISC Inject 1 Syringe into the articular space once a week.   Insulin Glargine 100 UNIT/ML Solostar Pen Commonly known as:  LANTUS SOLOSTAR Inject 10-20 Units into the skin daily at 10 pm. What changed:  how much to take   ipratropium 0.03 % nasal spray Commonly known as:  ATROVENT USE 2 SPRAYS IN EACH NOSTRIL EVERY 12 HOURS   linagliptin 5 MG Tabs tablet Commonly known as:  TRADJENTA Take 1 tablet (5 mg total) by mouth daily. insuranc would not cover januvia   losartan 25 MG tablet Commonly known as:  COZAAR Take 1 tablet (25 mg total) by mouth daily.   metFORMIN 1000 MG tablet Commonly known as:  GLUCOPHAGE Take 1 tablet (1,000 mg total) by mouth 2 (two) times daily with a meal.   metoprolol succinate 50 MG 24 hr tablet Commonly known as:  TOPROL-XL TAKE 1 TABLET BY MOUTH EVERY DAY What changed:    how much to take  how to take this  when to take this   multivitamin with minerals Tabs tablet Take 1 tablet by mouth daily. Start taking on:  08/12/2017   niacin 500 MG CR tablet Commonly known as:  NIASPAN Take 1 tablet (500 mg total) by mouth at bedtime.   sitaGLIPtin 100 MG tablet Commonly known as:  JANUVIA Take 100 mg by mouth daily.        Consultations:  Neurology  Procedures/Studies:  2D echo  Left ventricle: The cavity size was mildly dilated. Wall thickness was normal. Systolic function was normal. The estimated ejection  fraction was in the range of 55% to 60%. Dopplerparameters are consistent with abnormal left ventricularrelaxation (grade 1 diastolic dysfunction). Doppler parametersare consistent with high ventricular filling pressure. - Aortic valve: Transvalvular velocity was within the normal range. There was no stenosis. There was no regurgitation. - Mitral valve: Calcified annulus. Transvalvular velocity was within the normal range. There was no evidence for stenosis. There was mild regurgitation. - Left atrium: The atrium was mildly dilated. - Right ventricle: The cavity size was normal. Wall thickness was normal. Systolic function was mildly reduced. - Atrial septum: No defect or patent foramen ovale was identifiedby color flow Doppler. - Tricuspid valve: There was mild regurgitation. - Pulmonary arteries: Systolic pressure was within the normalrange. PA peak pressure: 28 mm Hg (S).    Ct Angio Head W Or Wo Contrast  Result Date: 08/10/2017 CLINICAL DATA:  82 y/o  M; left-sided weakness and facial droop. EXAM: CT ANGIOGRAPHY HEAD AND NECK TECHNIQUE: Multidetector CT imaging of the head and  neck was performed using the standard protocol during bolus administration of intravenous contrast. Multiplanar CT image reconstructions and MIPs were obtained to evaluate the vascular anatomy. Carotid stenosis measurements (when applicable) are obtained utilizing NASCET criteria, using the distal internal carotid diameter as the denominator. CONTRAST:  48mL ISOVUE-370 IOPAMIDOL (ISOVUE-370) INJECTION 76% COMPARISON:  03/21/2017 CTA of head and neck. 03/21/2017 MRI head. 08/09/2017 CT head. FINDINGS: CTA NECK FINDINGS Aortic arch: Three-vessel arch. Moderate calcific atherosclerosis. Partially visualized sternotomy, LIMA bypass, and saphenous grafts. Right carotid system: No evidence of dissection, stenosis (50% or greater) or occlusion. Calcified plaque of the right carotid bifurcation with mild less than 50%  proximal ICA stenosis. Left carotid system: No evidence of dissection or occlusion. Calcified plaque of left carotid bifurcation with moderate to severe 70% proximal ICA stenosis. Vertebral arteries: Calcified plaque of bilateral vertebral origin stenosis with mild-to-moderate stenosis. Right dominant system. No additional segment of stenosis, dissection, aneurysm, or occlusion. Skeleton: Mild reversal of cervical curvature and grade 1 anterolisthesis at the C5-6 level. Mild discogenic degenerative changes at C5-C7. No high-grade bony canal stenosis. Other neck: Negative. Upper chest: Negative. Review of the MIP images confirms the above findings CTA HEAD FINDINGS Anterior circulation: No significant stenosis, proximal occlusion, aneurysm, or vascular malformation. Calcified plaque of bilateral carotid siphons with mild less than 50% stenosis. Posterior circulation: No significant stenosis, proximal occlusion, aneurysm, or vascular malformation. Venous sinuses: As permitted by contrast timing, patent. Anatomic variants: Small anterior communicating artery. No posterior communicating artery identified, likely hypoplastic or absent. Review of the MIP images confirms the above findings IMPRESSION: 1. Patent carotid and vertebral arteries. 2. Patent anterior and posterior intracranial circulation. No large vessel occlusion, aneurysm, or high-grade stenosis. 3. Stable mild less than 50% proximal right ICA stenosis with calcified plaque. 4. Stable moderate to severe 70% proximal left ICA stenosis with calcified plaque. 5. Stable mild less than 50% bilateral carotid siphon stenosis with calcified plaque. These results were called by telephone at the time of interpretation on 08/10/2017 at 12:11 am to Dr. Amie Portland , who verbally acknowledged these results. Electronically Signed   By: Kristine Garbe M.D.   On: 08/10/2017 00:13   Ct Angio Neck W Or Wo Contrast  Result Date: 08/10/2017 CLINICAL DATA:  82 y/o   M; left-sided weakness and facial droop. EXAM: CT ANGIOGRAPHY HEAD AND NECK TECHNIQUE: Multidetector CT imaging of the head and neck was performed using the standard protocol during bolus administration of intravenous contrast. Multiplanar CT image reconstructions and MIPs were obtained to evaluate the vascular anatomy. Carotid stenosis measurements (when applicable) are obtained utilizing NASCET criteria, using the distal internal carotid diameter as the denominator. CONTRAST:  65mL ISOVUE-370 IOPAMIDOL (ISOVUE-370) INJECTION 76% COMPARISON:  03/21/2017 CTA of head and neck. 03/21/2017 MRI head. 08/09/2017 CT head. FINDINGS: CTA NECK FINDINGS Aortic arch: Three-vessel arch. Moderate calcific atherosclerosis. Partially visualized sternotomy, LIMA bypass, and saphenous grafts. Right carotid system: No evidence of dissection, stenosis (50% or greater) or occlusion. Calcified plaque of the right carotid bifurcation with mild less than 50% proximal ICA stenosis. Left carotid system: No evidence of dissection or occlusion. Calcified plaque of left carotid bifurcation with moderate to severe 70% proximal ICA stenosis. Vertebral arteries: Calcified plaque of bilateral vertebral origin stenosis with mild-to-moderate stenosis. Right dominant system. No additional segment of stenosis, dissection, aneurysm, or occlusion. Skeleton: Mild reversal of cervical curvature and grade 1 anterolisthesis at the C5-6 level. Mild discogenic degenerative changes at C5-C7. No high-grade bony canal stenosis. Other neck:  Negative. Upper chest: Negative. Review of the MIP images confirms the above findings CTA HEAD FINDINGS Anterior circulation: No significant stenosis, proximal occlusion, aneurysm, or vascular malformation. Calcified plaque of bilateral carotid siphons with mild less than 50% stenosis. Posterior circulation: No significant stenosis, proximal occlusion, aneurysm, or vascular malformation. Venous sinuses: As permitted by  contrast timing, patent. Anatomic variants: Small anterior communicating artery. No posterior communicating artery identified, likely hypoplastic or absent. Review of the MIP images confirms the above findings IMPRESSION: 1. Patent carotid and vertebral arteries. 2. Patent anterior and posterior intracranial circulation. No large vessel occlusion, aneurysm, or high-grade stenosis. 3. Stable mild less than 50% proximal right ICA stenosis with calcified plaque. 4. Stable moderate to severe 70% proximal left ICA stenosis with calcified plaque. 5. Stable mild less than 50% bilateral carotid siphon stenosis with calcified plaque. These results were called by telephone at the time of interpretation on 08/10/2017 at 12:11 am to Dr. Amie Portland , who verbally acknowledged these results. Electronically Signed   By: Kristine Garbe M.D.   On: 08/10/2017 00:13   Mr Brain Wo Contrast  Result Date: 08/10/2017 CLINICAL DATA:  82 y/o M; left-sided weakness and facial droop. History of stroke. EXAM: MRI HEAD WITHOUT CONTRAST TECHNIQUE: Multiplanar, multiecho pulse sequences of the brain and surrounding structures were obtained without intravenous contrast. COMPARISON:  08/09/2017 CT and CTA of the head. 03/21/2017 MRI head. FINDINGS: Brain: Subcentimeter focus of reduced diffusion within the right posterior limb of internal capsule (series 5 image 62, series 6, image 22, series 7, image 53) compatible with acute/early subacute infarction. No associated hemorrhage or mass effect. Stable patchy confluent nonspecific foci of T2 FLAIR hyperintense signal abnormality in subcortical and periventricular white matter are compatible with advanced chronic microvascular ischemic changes for age. Moderate brain parenchymal volume loss. No new susceptibility hypointensity to indicate interval intracranial hemorrhage. No structural abnormality of the brain. Hippocampi are symmetric in size and signal. No extra-axial collection,  hydrocephalus, or effacement of basilar cisterns. Vascular: Normal flow voids. Skull and upper cervical spine: Normal marrow signal. Sinuses/Orbits: Negative. Other: None. IMPRESSION: 1. Subcentimeter acute/early subacute infarction within the right posterior limb of internal capsule. No associated hemorrhage or mass effect. 2. Stable advanced chronic microvascular ischemic changes and moderate parenchymal volume loss of the brain. These results were called by telephone at the time of interpretation on 08/10/2017 at 12:55 am to Dr. Amie Portland , who verbally acknowledged these results. Electronically Signed   By: Kristine Garbe M.D.   On: 08/10/2017 00:56   Ct Head Code Stroke Wo Contrast  Result Date: 08/09/2017 CLINICAL DATA:  Code stroke. 82 y/o M; left-sided weakness and facial droop. EXAM: CT HEAD WITHOUT CONTRAST TECHNIQUE: Contiguous axial images were obtained from the base of the skull through the vertex without intravenous contrast. COMPARISON:  03/22/2017 CT head.  03/21/2017 MRI head. FINDINGS: Brain: No evidence of acute infarction, hemorrhage, hydrocephalus, extra-axial collection or mass lesion/mass effect. Stable chronic microvascular ischemic changes and parenchymal volume loss of the brain. Vascular: Calcific atherosclerosis of carotid siphons. No hyperdense vessel identified. Skull: Normal. Negative for fracture or focal lesion. Sinuses/Orbits: No acute finding. Other: Bilateral intra-ocular lens replacement. ASPECTS [facility] Stroke Program Early CT Score) - Ganglionic level infarction (caudate, lentiform nuclei, internal capsule, insula, M1-M3 cortex): 7 - Supraganglionic infarction (M4-M6 cortex): 3 Total score (0-10 with 10 being normal): 10 IMPRESSION: 1. No acute intracranial abnormality identified. 2. ASPECTS is 10 3. Stable chronic microvascular ischemic changes and parenchymal volume loss  of the brain. These results were communicated to Dr. Rory [name] at 11:50 pmon 5/29/2019by text  page via the [facility] messaging system. Electronically Signed   By: Kristine Garbe M.D.   On: 08/09/2017 23:51      Subjective: - no chest pain, shortness of breath, no abdominal pain, nausea or vomiting.   Discharge Exam: Vitals:   08/11/17 0734 08/11/17 1140  BP: (!) 153/76 136/78  Pulse: 85 99  Resp: 18 18  Temp: 98.3 F (36.8 C) 97.9 F (36.6 C)  SpO2: 95% 93%    General: Pt is alert, awake, not in acute distress Cardiovascular: RRR, S1/S2 +, no rubs, no gallops Respiratory: CTA bilaterally, no wheezing, no rhonchi Abdominal: Soft, NT, ND, bowel sounds + Neuro: Left sided hemiplegia   The results of significant diagnostics from this hospitalization (including imaging, microbiology, ancillary and laboratory) are listed below for reference.     Microbiology: No results found for this or any previous visit (from the past 240 hour(s)).   Labs: BNP (last 3 results) No results for input(s): BNP in the last 8760 hours. Basic Metabolic Panel: Recent Labs  Lab 08/09/17 2328 08/09/17 2334 08/11/17 0454  NA 138 140 140  K 4.6 4.6 4.1  CL 101 100* 112*  CO2 26  --  24  GLUCOSE 100* 99 127*  BUN 27* 30* 16  CREATININE 1.43* 1.30* 1.08  CALCIUM 9.7  --  9.1   Liver Function Tests: Recent Labs  Lab 08/09/17 2328  AST 22  ALT 22  ALKPHOS 39  BILITOT 0.6  PROT 7.2  ALBUMIN 3.9   No results for input(s): LIPASE, AMYLASE in the last 168 hours. No results for input(s): AMMONIA in the last 168 hours. CBC: Recent Labs  Lab 08/09/17 2328 08/09/17 2334 08/11/17 0454  WBC 7.6  --  7.2  NEUTROABS 3.2  --   --   HGB 14.3 15.0 13.1  HCT 42.4 44.0 38.9*  MCV 93.8  --  94.6  PLT 253  --  223   Cardiac Enzymes: Recent Labs  Lab 08/10/17 1001  TROPONINI <0.03   BNP: Invalid input(s): POCBNP CBG: Recent Labs  Lab 08/10/17 0919 08/10/17 1234 08/10/17 1703 08/11/17 0637 08/11/17 1138  GLUCAP 167* 140* 155* 127* 220*   D-Dimer No results for  input(s): DDIMER in the last 72 hours. Hgb A1c Recent Labs    08/10/17 0334  HGBA1C 6.0*   Lipid Profile Recent Labs    08/10/17 0334  CHOL 195  HDL 40*  LDLCALC 130*  TRIG 125  CHOLHDL 4.9   Thyroid function studies Recent Labs    08/10/17 1247  TSH 2.804   Anemia work up No results for input(s): VITAMINB12, FOLATE, FERRITIN, TIBC, IRON, RETICCTPCT in the last 72 hours. Urinalysis    Component Value Date/Time   COLORURINE STRAW (A) 08/10/2017 0001   APPEARANCEUR CLEAR 08/10/2017 0001   LABSPEC 1.012 08/10/2017 0001   PHURINE 6.0 08/10/2017 0001   GLUCOSEU NEGATIVE 08/10/2017 0001   GLUCOSEU NEGATIVE 05/16/2013 1009   HGBUR NEGATIVE 08/10/2017 0001   BILIRUBINUR NEGATIVE 08/10/2017 0001   KETONESUR NEGATIVE 08/10/2017 0001   PROTEINUR NEGATIVE 08/10/2017 0001   UROBILINOGEN 0.2 05/16/2013 1009   NITRITE NEGATIVE 08/10/2017 0001   LEUKOCYTESUR NEGATIVE 08/10/2017 0001   Sepsis Labs Invalid input(s): PROCALCITONIN,  WBC,  LACTICIDVEN   Time coordinating discharge: 35 minutes  SIGNED:  Marzetta Board, MD  [facility] 08/11/2017, 2:46 PM Pager [phone]  If 7PM-7AM, please contact night-coverage  [email or web address] Password [email or web address]

## 2017-08-11 NOTE — Progress Notes (Signed)
Retta Diones, RN  Rehab Admission Coordinator  Physical Medicine and Rehabilitation  PMR Pre-admission  Signed  Date of Service:  08/11/2017 3:07 PM       Related encounter: ED to Hosp-Admission (Current) from 08/09/2017 in [place] [facility]      Signed            Show:Clear all [x] Manual[x] Template[x] Copied  Added by: [x] Karl Bales Evalee Mutton, RN   [] Hover for details   PMR Admission Coordinator Pre-Admission Assessment  Patient: Fred [name] is an 82 y.o., male MRN: [id number] DOB: Jan 02, 1936 Height: 5\' 3"  (160 cm) Weight: 84.4 kg (186 lb)                                                                                                                                                  Insurance Information HMO: *No    PPO:       PCP:       IPA:       80/20:       OTHER:   PRIMARY:  Medicare A and B      Policy#: [id number]      Subscriber: patient CM Name:        Phone#:       Fax#:   Pre-Cert#:        Employer: Retired Benefits:  Phone #:       Name: Checked in [place]. Date: 03/14/02     Deduct: $1364      Out of Pocket Max: None      Life Max: N/A CIR: 1005      SNF: 100 days Outpatient: 80%     Co-Pay: 20% Home Health: 100%      Co-Pay: none DME: 80%     Co-Pay: 20% Providers: patient's choice  SECONDARY: UHC      Policy#: [id number]      Subscriber:  patient CM Name:        Phone#:       Fax#:   Pre-Cert#:        Employer: Retired Benefits:  Phone #: [phone]     Name:   Eff. 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 Per chart review patient lives with spouse. Independent still active. Two-level home with bedroom on [address] and 2 steps to entry. Presented 08/09/2017 with left-sided weakness facial droop and slurred speech. Cranial CT scan negative. CT angiogram of head and neck showed mild less than 50% proximal right ICA stenosis and moderate to severe 70% proximal left ICA stenosis. MRI showed subcentimeter acute versus early subacute infarction within the right posterior limb of internal capsule. Echocardiogram with ejection fraction of 21% grade 1 diastolic dysfunction. Currently on aspirin and Plavix for CVA prophylaxis. Subcutaneous Lovenox for DVT prophylaxis. Await plan for formal swallow study. Physical therapy evaluation completed with recommendations of physical medicine rehab consult.   Total: 8=NIH  Past Medical History      Past Medical History:  Diagnosis Date  . Atrial fibrillation ([place])    Post operative  . CAD (coronary artery disease)    s/p cabg  . CHF (congestive heart failure) ([place])   . Diverticulosis   . DJD (degenerative joint disease)   . DM (diabetes mellitus) ([place])   . History of anemia   . History of stroke   . Hyperlipidemia   . Hypertension   . 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 Peripheral vascular disease ([place])   . Polyneuropathy, diabetic ([place])   . Seizure disorder, complex partial ([place])   . Syncope and collapse     Family History  family history includes Epilepsy in his father; Lung cancer in his unknown relative; Stroke in his mother.  Prior Rehab/Hospitalizations: No previous  rehab  Has the patient had major surgery during 100 days prior to admission? No  Current Medications   Current Facility-Administered Medications:  .  0.9 %  sodium chloride infusion, , Intravenous, Continuous, Rondel Jumbo, PA-C, Last Rate: 50 mL/hr at 08/10/17 1727 .  acetaminophen (TYLENOL) tablet 650 mg, 650 mg, Oral, Q4H PRN **OR** acetaminophen (TYLENOL) solution 650 mg, 650 mg, Per Tube, Q4H PRN **OR** acetaminophen (TYLENOL) suppository 650 mg, 650 mg, Rectal, Q4H PRN, Wertman, Sara E, PA-C .  albuterol (PROVENTIL) (2.5 MG/3ML) 0.083% nebulizer solution 2.5 mg, 2.5 mg, Nebulization, Q6H PRN, Wertman, Sara E, PA-C .  allopurinol (ZYLOPRIM) tablet 300 mg, 300 mg, Oral, Daily, Wertman, Sara E, PA-C, 300 mg at 08/11/17 1042 .  [START ON 08/12/2017] aspirin EC tablet 81 mg, 81 mg, Oral, Daily, Gherghe, Costin M, MD .  atorvastatin (LIPITOR) tablet 80 mg, 80 mg, Oral, Daily, Rondel Jumbo, PA-C, 80 mg at 08/11/17 1043 .  clopidogrel (PLAVIX) tablet 75 mg, 75 mg, Oral, Daily, Biby, Sharon L, NP, 75 mg at 08/11/17 1043 .  divalproex (DEPAKOTE ER) 24 hr tablet 500 mg, 500 mg, Oral, Daily, Rondel Jumbo, PA-C, 500 mg at 08/11/17 1043 .  enoxaparin (LOVENOX) injection 40 mg, 40 mg, Subcutaneous, Q24H, Wertman, Coralee Pesa, PA-C, 40 mg at 08/11/17 1312 .  folic acid (FOLVITE) tablet 1 mg, 1 mg, Oral, Daily, Karmen Bongo, MD, 1 mg at 08/11/17 1043 .  hydrALAZINE (APRESOLINE) injection 5 mg, 5 mg, Intravenous, Q6H PRN, Shawn Route, Coralee Pesa, PA-C .  HYDROcodone-acetaminophen (NORCO/VICODIN) 5-325 MG per tablet 1-2 tablet,  1-2 tablet, Oral, Q4H PRN, Rondel Jumbo, PA-C .  insulin aspart (novoLOG) injection 0-9 Units, 0-9 Units, Subcutaneous, TID WC, Rondel Jumbo, PA-C, 3 Units at 08/11/17 1312 .  insulin glargine (LANTUS) injection 10 Units, 10 Units, Subcutaneous, Q2200, Karmen Bongo, MD, 10 Units at 08/10/17 2243 .  LORazepam (ATIVAN) tablet 1 mg, 1 mg, Oral, Q6H PRN **OR** LORazepam (ATIVAN)  injection 1 mg, 1 mg, Intravenous, Q6H PRN, Karmen Bongo, MD .  multivitamin with minerals tablet 1 tablet, 1 tablet, Oral, Daily, Karmen Bongo, MD, 1 tablet at 08/11/17 1043 .  niacin (NIASPAN) CR tablet 500 mg, 500 mg, Oral, QHS, Wertman, Sara E, PA-C .  ondansetron (ZOFRAN) injection 4 mg, 4 mg, Intravenous, Q8H PRN, Rondel Jumbo, PA-C .  RESOURCE THICKENUP CLEAR, , Oral, PRN, Karmen Bongo, MD .  thiamine 500mg  in normal saline (27ml) IVPB, 500 mg, Intravenous, TID, Karmen Bongo, MD, Last Rate: 100 mL/hr at 08/11/17 1312, 500 mg at 08/11/17 1312 .  zolpidem (AMBIEN) tablet 5 mg, 5 mg, Oral, QHS PRN, Rondel Jumbo, PA-C  Patients Current Diet:       Diet Order           DIET DYS 2 Room service appropriate? Yes; Fluid consistency: Nectar Thick  Diet effective now          Precautions / Restrictions Precautions Precautions: Fall Restrictions Weight Bearing Restrictions: No   Has the patient had 2 or more falls or a fall with injury in the past year?No  Prior Activity Level Community (5-7x/wk): Went out daily, was driving.  Home Assistive Devices / Equipment Home Assistive Devices/Equipment: CBG Meter Home Equipment: Cane - quad, Bedside commode, Grab bars - tub/shower, Hand held shower head, Tub bench  Prior Device Use: Indicate devices/aids used by the patient prior to current illness, exacerbation or injury? None  Prior Functional Level Prior Function Level of Independence: Independent Comments: Was still very active at home, performing ADLs, IADLs, chopping wood, etc.   Self Care: Did the patient need help bathing, dressing, using the toilet or eating?  Independent  Indoor Mobility: Did the patient need assistance with walking from room to room (with or without device)? Independent  Stairs: Did the patient need assistance with internal or external stairs (with or without device)? Independent  Functional Cognition: Did the patient need  help planning regular tasks such as shopping or remembering to take medications? Independent  Current Functional Level Cognition  Overall Cognitive Status: Impaired/Different from baseline Orientation Level: Oriented X4 Following Commands: Follows one step commands consistently    Extremity Assessment (includes Sensation/Coordination)  Upper Extremity Assessment: LUE deficits/detail LUE Deficits / Details: flaccid LUE, pt denies numbness/tingling  LUE Coordination: decreased fine motor, decreased gross motor  Lower Extremity Assessment: Defer to PT evaluation LLE Deficits / Details: LLE grossly 2/5. Family reports weakness is worse; notified RN.     ADLs  Overall ADL's : Needs assistance/impaired Eating/Feeding: Minimal assistance, Sitting Grooming: Minimal assistance, Moderate assistance, Sitting, Wash/dry face Grooming Details (indicate cue type and reason): min-modA for sitting balance EOB while pt wiping face Upper Body Bathing: Moderate assistance, Sitting Lower Body Bathing: Maximal assistance, +2 for physical assistance, +2 for safety/equipment, Sit to/from stand Upper Body Dressing : Moderate assistance, Sitting Lower Body Dressing: Maximal assistance, +2 for physical assistance, +2 for safety/equipment, Sit to/from stand Toilet Transfer: Maximal assistance, +2 for physical assistance, +2 for safety/equipment, Stand-pivot, BSC Toilet Transfer Details (indicate cue type and reason): simulated in transfer to recliner;  use of Stedy for transfers this session  Toileting- Clothing Manipulation and Hygiene: Maximal assistance, +2 for physical assistance, +2 for safety/equipment, Sit to/from stand Functional mobility during ADLs: Maximal assistance, +2 for physical assistance, +2 for safety/equipment(use of Stedy for stand pivot transfer ) General ADL Comments: pt completing sit<>stand at EOB, additional sit<>stand at [facility] with maxA+2, pt able to maintain standing approx 1-2 min  prior to sitting at [facility] with MaxA+2 to maintain standing balance, cues to lean R due to strong L lateral lean     Mobility  Overal bed mobility: Needs Assistance Bed Mobility: Supine to Sit Supine to sit: Mod assist, +2 for physical assistance, +2 for safety/equipment, HOB elevated Sit to supine: Max assist General bed mobility comments: moving to R side of bed, assist for LLE and to scoot hips towards EOB with use of bed pad, pt using RUE and bedrail to assist, almost able to fully bring trunk upright into sitting, requires assist to fully achieve upright position     Transfers  Overall transfer level: Needs assistance Equipment used: 2 person hand held assist, Ambulation equipment used Transfer via Lift Equipment: Stedy Transfers: Sit to/from Stand, Risk manager Sit to Stand: Max assist, +2 physical assistance, +2 safety/equipment Stand pivot transfers: Total assist, +2 physical assistance, +2 safety/equipment General transfer comment: MaxA+2 initially with +2 HHA to rise and steady with L knee blocked, verbal cues for bringing trunk upright and to maintain midline as pt tends to lean L; completed additional sit<>stand at [facility] with MaxA+2, use of Stedy to transfer to recliner     Ambulation / Gait / Stairs / Office manager / Balance Dynamic Sitting Balance Sitting balance - Comments: L lateral lean in sitting. Required min to mod A for sitting balance, verbal cues to correct posture  Balance Overall balance assessment: Needs assistance Sitting-balance support: Bilateral upper extremity supported, Feet supported Sitting balance-Leahy Scale: Poor Sitting balance - Comments: L lateral lean in sitting. Required min to mod A for sitting balance, verbal cues to correct posture  Postural control: Left lateral lean Standing balance-Leahy Scale: Zero    Special needs/care consideration BiPAP/CPAP No CPM No Continuous Drip IV 0.9% NS 50 mL/hr Dialysis  No       Life Vest No Oxygen No Special Bed No Trach Size No Wound Vac (area) No    Skin No                            Bowel mgmt: Last BM 08/10/17 Bladder mgmt: Condom catheter Diabetic mgmt: Yes, on oral medication and insulin at home    Previous Home Environment Living Arrangements: Spouse/significant other, Children Available Help at Discharge: Family, Available 24 hours/day Type of Home: House Home Layout: Two level, Able to live on main level with bedroom/bathroom Home Access: Stairs to enter Entrance Stairs-Rails: None Entrance Stairs-Number of Steps: 2 Bathroom Shower/Tub: Tub/shower unit, Architectural technologist: Standard Home Care Services: No  Discharge Living Setting Plans for Discharge Living Setting: Patient's home, House, Lives with (comment)(Lives with wife.) Type of Home at Discharge: House Discharge Home Layout: Two level, Able to live on main level with bedroom/bathroom Alternate Level Stairs-Number of Steps: Flight Discharge Home Access: Stairs to enter Technical brewer of Steps: 2 steps Does the patient have any problems obtaining your medications?: No  Social/Family/Support Systems Patient Roles: Spouse, Parent(Has a wife and 3 daughters.) Contact Information: Gunner Iodice -  wife Anticipated Caregiver: Wife, daughters, grand daughter Anticipated Caregiver's Contact Information: Enid Derry - wife - [phone]; Kenan Moodie daughter - [phone] Ability/Limitations of Caregiver: Wife has Lewybody dementia.  Daughter lives beside patient and niece lives behind patient.  Youngest daughter is not working and may be able to assist. Caregiver Availability: 24/7 Discharge Plan Discussed with Primary Caregiver: Yes Is Caregiver In Agreement with Plan?: Yes Does Caregiver/Family have Issues with Lodging/Transportation while Pt is in Rehab?: No  Goals/Additional Needs Patient/Family Goal for Rehab: PT/OT supervision to min assist, SLP  mod I goals Expected length of stay: 20-28 days Cultural Considerations: [employer] Dietary Needs: Dys 2, nectar thick liquids Equipment Needs: TBD Pt/Family Agrees to Admission and willing to participate: Yes Program Orientation Provided & Reviewed with Pt/Caregiver Including Roles  & Responsibilities: Yes  Decrease burden of Care through IP rehab admission: N/A  Possible need for SNF placement upon discharge: Not planned  Patient Condition: This patient's condition remains as documented in the consult dated 08/11/17, in which the Rehabilitation Physician determined and documented that the patient's condition is appropriate for intensive rehabilitative care in an inpatient rehabilitation facility. Will admit to inpatient rehab today.  Preadmission Screen Completed By:  Retta Diones, 08/11/2017 3:18 PM ______________________________________________________________________   Discussed status with Dr. Naaman Plummer on 08/11/17 at 1517 and received telephone approval for admission today.  Admission Coordinator:  Retta Diones, time 1518/Date 08/11/17             Cosigned by: Meredith Staggers, MD at 08/11/2017 3:24 PM  Revision History

## 2017-08-11 NOTE — H&P (Signed)
Physical Medicine and Rehabilitation Admission H&P    Chief Complaint  Patient presents with  . Code Stroke  : HPI: Fred [name] is an 82 year old right-handed male with history of alcohol use, hypertension, seizure maintained on Depakote as well as  history of CVA with little residual January 2019 and did receive TPA.  CAD with CABG maintained on aspirin, transient atrial fibrillation, diastolic congestive heart failure.  Per chart review patient lives with spouse.  Independent and active.  Two-level home with bedroom on Main level and 2 steps to entry.  Excellent local family support.  Presented 08/09/2017 with left-sided weakness and facial droop as well as slurred speech.  Cranial CT scan negative.  CT angiogram of head and neck showed mild less than 50% proximal right ICA stenosis and moderate to severe 70% proximal left ICA stenosis.  MRI showed subcentimeter acute versus early subacute infarction within the right posterior limb of internal capsule.  Echocardiogram with ejection fraction of 26% grade 1 diastolic dysfunction.  Currently maintained on aspirin and Plavix for CVA prophylaxis x3 weeks then aspirin alone.  Subcutaneous Lovenox for DVT prophylaxis.  Dysphasia #2 nectar thick liquid diet.  Physical and occupational therapy evaluations completed with recommendations of physical medicine rehab consult.  Patient was admitted for a comprehensive rehab program.  Review of Systems  Constitutional: Negative for chills and fever.  HENT: Negative for hearing loss.   Eyes: Negative for blurred vision and double vision.  Respiratory: Negative for cough and shortness of breath.   Cardiovascular: Positive for palpitations.  Gastrointestinal: Positive for constipation. Negative for nausea and vomiting.  Genitourinary: Positive for urgency. Negative for flank pain and hematuria.  Musculoskeletal: Positive for myalgias.  Skin: Negative for rash.  Neurological: Positive for speech change,  focal weakness and seizures.  All other systems reviewed and are negative.  Past Medical History:  Diagnosis Date  . Atrial fibrillation ([place])    Post operative  . CAD (coronary artery disease)    s/p cabg  . CHF (congestive heart failure) ([place])   . Diverticulosis   . DJD (degenerative joint disease)   . DM (diabetes mellitus) ([place])   . History of anemia   . History of stroke   . Hyperlipidemia   . Hypertension   . 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 Peripheral vascular disease ([place])   . Polyneuropathy, diabetic ([place])   . Seizure disorder, complex partial ([place])   . Syncope and collapse    Past Surgical History:  Procedure Laterality Date  . CATARACT EXTRACTION W/ INTRAOCULAR LENS  IMPLANT, BILATERAL  06/2013   Dr. [name]  . CORONARY ARTERY BYPASS GRAFT  02/13/2004   4 vesel  . removal of sebaceous cyst from neck  08/2007   Dr. [name]  . TONSILLECTOMY     Family History  Problem Relation Age of Onset  . Epilepsy Father   . Stroke Mother   . Lung cancer Unknown    Social History:  reports that he quit smoking about 40 years ago. His smoking use included cigarettes. He has a 22.00 pack-year smoking history. He has never used smokeless tobacco. He reports that he does not drink alcohol or use drugs. Allergies:  Allergies  Allergen Reactions  . Lisinopril Cough   Medications Prior to Admission  Medication Sig Dispense Refill  . allopurinol (ZYLOPRIM) 300 MG tablet Take 1 tablet (300 mg total) by mouth daily. 90 tablet 4  . aspirin 325 MG tablet  Take 325 mg by mouth daily.    Marland Kitchen atorvastatin (LIPITOR) 40 MG tablet Take 1 tablet (40 mg total) by mouth daily. 90 tablet 1  . Coenzyme Q10 (COQ10) 30 MG CAPS Take 1 tablet by mouth daily.      . divalproex (DEPAKOTE ER) 500 MG 24 hr tablet Take 1 tablet (500 mg total) by mouth daily. 30 tablet 11  . ezetimibe (ZETIA) 10 MG tablet Take 1 tablet (10 mg total) by mouth daily. 90 tablet 3  . furosemide  (LASIX) 40 MG tablet TAKE 1 TABLET BY MOUTH EVERY DAY AS NEEDED FOR FLUID RETENTION (Patient taking differently: TAKE 1 TABLET (54m) BY MOUTH EVERY DAY AS NEEDED FOR FLUID RETENTION) 90 tablet 1  . glimepiride (AMARYL) 4 MG tablet Take 1 tablet (4 mg total) by mouth daily with breakfast. 90 tablet 3  . Insulin Glargine (LANTUS SOLOSTAR) 100 UNIT/ML Solostar Pen Inject 10-20 Units into the skin daily at 10 pm. (Patient taking differently: Inject 10 Units into the skin daily at 10 pm. ) 15 mL 11  . ipratropium (ATROVENT) 0.03 % nasal spray USE 2 SPRAYS IN EACH NOSTRIL EVERY 12 HOURS 30 mL 3  . losartan (COZAAR) 25 MG tablet Take 1 tablet (25 mg total) by mouth daily. 90 tablet 1  . metFORMIN (GLUCOPHAGE) 1000 MG tablet Take 1 tablet (1,000 mg total) by mouth 2 (two) times daily with a meal. 180 tablet 3  . metoprolol succinate (TOPROL-XL) 50 MG 24 hr tablet TAKE 1 TABLET BY MOUTH EVERY DAY (Patient taking differently: TAKE 1 TABLET (539m BY MOUTH EVERY DAY) 90 tablet 1  . niacin (NIASPAN) 500 MG CR tablet Take 1 tablet (500 mg total) by mouth at bedtime. 90 tablet 3  . sitaGLIPtin (JANUVIA) 100 MG tablet Take 100 mg by mouth daily.    . celecoxib (CELEBREX) 200 MG capsule One to 2 tablets by mouth daily as needed for pain. (Patient not taking: Reported on 08/09/2017) 60 capsule 2  . Hyaluronan (ORTHOVISC) 30 MG/2ML SOSY Inject 1 Syringe into the articular space once a week. (Patient not taking: Reported on 08/09/2017) 8 Syringe 0  . linagliptin (TRADJENTA) 5 MG TABS tablet Take 1 tablet (5 mg total) by mouth daily. insuranc would not cover jaTongaPatient not taking: Reported on 08/09/2017) 30 tablet 5    Drug Regimen Review Drug regimen was reviewed and remains appropriate with no significant issues identified  Home: Home Living Family/patient expects to be discharged to:: Private residence Living Arrangements: Spouse/significant other, Children Available Help at Discharge: Family, Available 24  hours/day Type of Home: House Home Access: Stairs to enter [employer] Steps: 2 Entrance Stairs-Rails: None Home Layout: Two level, Able to live on main level with bedroom/bathroom Bathroom Shower/Tub: Tub/shower unit, [employer] Home Equipment: Cane - quad, Bedside commode, Grab bars - tub/shower, Hand held shower head, Tub bench   Functional History: Prior Function Level of Independence: Independent Comments: Was still very active at home, performing ADLs, IADLs, chopping wood, etc.   Functional Status:  Mobility: Bed Mobility Overal bed mobility: Needs Assistance Bed Mobility: Supine to Sit, Sit to Supine Supine to sit: Max assist Sit to supine: Max assist General bed mobility comments: Pt requiring max A for assist with moving LEs and trunk elevation to come up to sitting. Increased L lateral lean in sitting and required from min to mod A to maintain balance. Max A to return to supine.  Transfers General transfer comment: Deferred  ADL: ADL Overall ADL's : Needs assistance/impaired Eating/Feeding: Minimal assistance, Sitting Grooming: Minimal assistance, Moderate assistance, Sitting, Wash/dry face Grooming Details (indicate cue type and reason): min-modA for sitting balance EOB while pt wiping face Upper Body Bathing: Moderate assistance, Sitting Lower Body Bathing: Maximal assistance, +2 for physical assistance, +2 for safety/equipment, Sit to/from stand Upper Body Dressing : Moderate assistance, Sitting Lower Body Dressing: Maximal assistance, +2 for physical assistance, +2 for safety/equipment, Sit to/from stand Toilet Transfer: Maximal assistance, +2 for physical assistance, +2 for safety/equipment, Stand-pivot, BSC Toilet Transfer Details (indicate cue type and reason): simulated in transfer to recliner; use of Stedy for transfers this session  Toileting- Clothing Manipulation and Hygiene: Maximal assistance, +2 for physical  assistance, +2 for safety/equipment, Sit to/from stand Functional mobility during ADLs: Maximal assistance, +2 for physical assistance, +2 for safety/equipment(use of Stedy for stand pivot transfer ) General ADL Comments: pt completing sit<>stand at EOB, additional sit<>stand at [facility] with maxA+2, pt able to maintain standing approx 1-2 min prior to sitting at [facility] with MaxA+2 to maintain standing balance, cues to lean R due to strong L lateral lean   Cognition: Cognition Overall Cognitive Status: Impaired/Different from baseline Orientation Level: Oriented X4 Cognition Arousal/Alertness: Awake/alert Behavior During Therapy: WFL for tasks assessed/performed Overall Cognitive Status: Impaired/Different from baseline Area of Impairment: Awareness, Following commands Following Commands: Follows one step commands consistently Awareness: Emergent  Physical Exam: Blood pressure 136/78, pulse 99, temperature 97.9 F (36.6 C), temperature source Oral, resp. rate 18, height _0  (1.6 m), weight 84.4 kg (186 lb), SpO2 93 %. Physical Exam  Constitutional: He is oriented to person, place, and time. He appears well-developed and well-nourished.  HENT:  Head: Normocephalic.  Eyes: Pupils are equal, round, and reactive to light.  Neck: Normal range of motion. No tracheal deviation present. No thyromegaly present.  Cardiovascular: Normal rate and regular rhythm. Exam reveals no friction rub.  No murmur heard. Respiratory: Effort normal. No respiratory distress. He has no wheezes.  GI: Soft. He exhibits no distension. There is no tenderness.  Musculoskeletal: He exhibits no edema or deformity.  Neurological: He is alert and oriented to person, place, and time.  Follows full commands.  Fair awareness of deficits.  Mood is appropriate.left central 7 and tongue deviation. LUE 0/5. LLE 0/5. RUE and RLE 4-5/5. Sensation intact. Normal insight and awareness. Language intact.  Skin: Skin is warm and dry.    Psychiatric: He has a normal mood and affect. His behavior is normal.    Results for orders placed or performed during the hospital encounter of 08/09/17 (from the past 48 hour(s))  CBG monitoring, ED     Status: Abnormal   Collection Time: 08/09/17 11:27 PM  Result Value Ref Range   Glucose-Capillary 104 (H) 65 - 99 mg/dL  Ethanol     Status: None   Collection Time: 08/09/17 11:28 PM  Result Value Ref Range   Alcohol, Ethyl (B) <10 <10 mg/dL    Comment: (NOTE) Lowest detectable limit for serum alcohol is 10 mg/dL. For medical purposes only. Performed at [facility], [place] [address] [place], [place] [address]   Protime-INR     Status: None   Collection Time: 08/09/17 11:28 PM  Result Value Ref Range   Prothrombin Time 13.0 11.4 - 15.2 seconds   INR 0.99     Comment: Performed at [facility] [address], [place], [place] [address]  APTT     Status: None  Collection Time: 08/09/17 11:28 PM  Result Value Ref Range   aPTT 25 24 - 36 seconds    Comment: Performed at [facility], [place] [address], [place] [address]  CBC     Status: None   Collection Time: 08/09/17 11:28 PM  Result Value Ref Range   WBC 7.6 4.0 - 10.5 K/uL   RBC 4.52 4.22 - 5.81 MIL/uL   Hemoglobin 14.3 13.0 - 17.0 g/dL   HCT 42.4 39.0 - 52.0 %   MCV 93.8 78.0 - 100.0 fL   MCH 31.6 26.0 - 34.0 pg   MCHC 33.7 30.0 - 36.0 g/dL   RDW 12.7 11.5 - 15.5 %   Platelets 253 150 - 400 K/uL    Comment: Performed at [facility], [place] [address], [place], [place] [address]  Differential     Status: None   Collection Time: 08/09/17 11:28 PM  Result Value Ref Range   Neutrophils Relative % 42 %   Neutro Abs 3.2 1.7 - 7.7 K/uL   Lymphocytes Relative 44 %   Lymphs Abs 3.3 0.7 - 4.0 K/uL   Monocytes Relative 11 %   Monocytes Absolute 0.8 0.1 - 1.0 K/uL   Eosinophils Relative 3 %   Eosinophils Absolute 0.2 0.0 - 0.7 K/uL   Basophils Relative 1 %   Basophils Absolute 0.1 0.0 - 0.1  K/uL   Immature Granulocytes 1 %   Abs Immature Granulocytes 0.0 0.0 - 0.1 K/uL    Comment: Performed at [facility] [address], [place], [place] [address]  Comprehensive metabolic panel     Status: Abnormal   Collection Time: 08/09/17 11:28 PM  Result Value Ref Range   Sodium 138 135 - 145 mmol/L   Potassium 4.6 3.5 - 5.1 mmol/L   Chloride 101 101 - 111 mmol/L   CO2 26 22 - 32 mmol/L   Glucose, Bld 100 (H) 65 - 99 mg/dL   BUN 27 (H) 6 - 20 mg/dL   Creatinine, Ser 1.43 (H) 0.61 - 1.24 mg/dL   Calcium 9.7 8.9 - 10.3 mg/dL   Total Protein 7.2 6.5 - 8.1 g/dL   Albumin 3.9 3.5 - 5.0 g/dL   AST 22 15 - 41 U/L   ALT 22 17 - 63 U/L   Alkaline Phosphatase 39 38 - 126 U/L   Total Bilirubin 0.6 0.3 - 1.2 mg/dL   GFR calc non Af Amer 45 (L) >60 mL/min   GFR calc Af Amer 52 (L) >60 mL/min    Comment: (NOTE) The eGFR has been calculated using the CKD EPI equation. This calculation has not been validated in all clinical situations. eGFR's persistently <60 mL/min signify possible Chronic Kidney Disease.    Anion gap 11 5 - 15    Comment: Performed at [facility] [address], [place], [place] [address]  I-stat troponin, ED     Status: None   Collection Time: 08/09/17 11:32 PM  Result Value Ref Range   Troponin i, poc 0.00 0.00 - 0.08 ng/mL   Comment 3            Comment: Due to the release kinetics of cTnI, a negative result within the first hours of the onset of symptoms does not rule out myocardial infarction with certainty. If myocardial infarction is still suspected, repeat the test at appropriate intervals.   I-Stat Chem 8, ED     Status: Abnormal   Collection Time:  08/09/17 11:34 PM  Result Value Ref Range   Sodium 140 135 - 145 mmol/L   Potassium 4.6 3.5 - 5.1 mmol/L   Chloride 100 (L) 101 - 111 mmol/L   BUN 30 (H) 6 - 20 mg/dL   Creatinine, Ser 1.30 (H) 0.61 - 1.24 mg/dL   Glucose, Bld 99 65 - 99 mg/dL   Calcium, Ion 1.23 1.15 - 1.40 mmol/L   TCO2  26 22 - 32 mmol/L   Hemoglobin 15.0 13.0 - 17.0 g/dL   HCT 44.0 39.0 - 52.0 %  Valproic acid level     Status: Abnormal   Collection Time: 08/09/17 11:49 PM  Result Value Ref Range   Valproic Acid Lvl 16 (L) 50.0 - 100.0 ug/mL    Comment: Performed at [facility], [address] [place], [place] [address]  Urine rapid drug screen (hosp performed)     Status: None   Collection Time: 08/10/17 12:01 AM  Result Value Ref Range   Opiates NONE DETECTED NONE DETECTED   Cocaine NONE DETECTED NONE DETECTED   Benzodiazepines NONE DETECTED NONE DETECTED   Amphetamines NONE DETECTED NONE DETECTED   Tetrahydrocannabinol NONE DETECTED NONE DETECTED   Barbiturates NONE DETECTED NONE DETECTED    Comment: (NOTE) DRUG SCREEN FOR MEDICAL PURPOSES ONLY.  IF CONFIRMATION IS NEEDED FOR ANY PURPOSE, NOTIFY LAB WITHIN 5 DAYS. LOWEST DETECTABLE LIMITS FOR URINE DRUG SCREEN Drug Class                     Cutoff (ng/mL) Amphetamine and metabolites    1000 Barbiturate and metabolites    200 Benzodiazepine                 353 Tricyclics and metabolites     300 Opiates and metabolites        300 Cocaine and metabolites        300 THC                            50 Performed at [facility], [place] [address], [place], [place] [address]   Urinalysis, Routine w reflex microscopic     Status: Abnormal   Collection Time: 08/10/17 12:01 AM  Result Value Ref Range   Color, Urine STRAW (A) YELLOW   APPearance CLEAR CLEAR   Specific Gravity, Urine 1.012 1.005 - 1.030   pH 6.0 5.0 - 8.0   Glucose, UA NEGATIVE NEGATIVE mg/dL   Hgb urine dipstick NEGATIVE NEGATIVE   Bilirubin Urine NEGATIVE NEGATIVE   Ketones, ur NEGATIVE NEGATIVE mg/dL   Protein, ur NEGATIVE NEGATIVE mg/dL   Nitrite NEGATIVE NEGATIVE   Leukocytes, UA NEGATIVE NEGATIVE    Comment: Performed at [facility] [address] [place], [place] [address]  Hemoglobin A1c     Status: Abnormal   Collection Time: 08/10/17  3:34 AM    Result Value Ref Range   Hgb A1c MFr Bld 6.0 (H) 4.8 - 5.6 %    Comment: (NOTE) Pre diabetes:          5.7%-6.4% Diabetes:              >6.4% Glycemic control for   <7.0% adults with diabetes    Mean Plasma Glucose 125.5 mg/dL    Comment: Performed at [facility] [address], [place], [place] [address]  Lipid panel     Status: Abnormal   Collection  Time: 08/10/17  3:34 AM  Result Value Ref Range   Cholesterol 195 0 - 200 mg/dL   Triglycerides 125 <150 mg/dL   HDL 40 (L) >40 mg/dL   Total CHOL/HDL Ratio 4.9 RATIO   VLDL 25 0 - 40 mg/dL   LDL Cholesterol 130 (H) 0 - 99 mg/dL    Comment:        Total Cholesterol/HDL:CHD Risk Coronary Heart Disease Risk Table                     Men   Women  1/2 Average Risk   3.4   3.3  Average Risk       5.0   4.4  2 X Average Risk   9.6   7.1  3 X Average Risk  23.4   11.0        Use the calculated Patient Ratio above and the CHD Risk Table to determine the patient's CHD Risk.        ATP III CLASSIFICATION (LDL):  <100     mg/dL   Optimal  100-129  mg/dL   Near or Above                    Optimal  130-159  mg/dL   Borderline  160-189  mg/dL   High  >190     mg/dL   Very High Performed at [facility] [address], [place], [place] [address]   CBG monitoring, ED     Status: Abnormal   Collection Time: 08/10/17  7:33 AM  Result Value Ref Range   Glucose-Capillary 130 (H) 65 - 99 mg/dL   Comment 1 Notify RN    Comment 2 Document in Chart   CBG monitoring, ED     Status: Abnormal   Collection Time: 08/10/17  9:19 AM  Result Value Ref Range   Glucose-Capillary 167 (H) 65 - 99 mg/dL  Troponin I     Status: None   Collection Time: 08/10/17 10:01 AM  Result Value Ref Range   Troponin I <0.03 <0.03 ng/mL    Comment: Performed at [facility], [place] [address], [place], [place] [address]  CBG monitoring, ED     Status: Abnormal   Collection Time: 08/10/17 12:34 PM  Result Value Ref Range   Glucose-Capillary  140 (H) 65 - 99 mg/dL  TSH     Status: None   Collection Time: 08/10/17 12:47 PM  Result Value Ref Range   TSH 2.804 0.350 - 4.500 uIU/mL    Comment: Performed by a 3rd Generation assay with a functional sensitivity of <=0.01 uIU/mL. Performed at [facility], [place] [address], [place], [place] [address]   CBG monitoring, ED     Status: Abnormal   Collection Time: 08/10/17  5:03 PM  Result Value Ref Range   Glucose-Capillary 155 (H) 65 - 99 mg/dL  Basic metabolic panel     Status: Abnormal   Collection Time: 08/11/17  4:54 AM  Result Value Ref Range   Sodium 140 135 - 145 mmol/L   Potassium 4.1 3.5 - 5.1 mmol/L   Chloride 112 (H) 101 - 111 mmol/L   CO2 24 22 - 32 mmol/L   Glucose, Bld 127 (H) 65 - 99 mg/dL   BUN 16 6 - 20 mg/dL   Creatinine, Ser 1.08 0.61 - 1.24 mg/dL   Calcium 9.1 8.9 - 10.3 mg/dL   GFR calc non Af  Amer >60 >60 mL/min   GFR calc Af Amer >60 >60 mL/min    Comment: (NOTE) The eGFR has been calculated using the CKD EPI equation. This calculation has not been validated in all clinical situations. eGFR's persistently <60 mL/min signify possible Chronic Kidney Disease.    Anion gap 4 (L) 5 - 15    Comment: Performed at [facility] [address] [place], [place] [address]  CBC     Status: Abnormal   Collection Time: 08/11/17  4:54 AM  Result Value Ref Range   WBC 7.2 4.0 - 10.5 K/uL   RBC 4.11 (L) 4.22 - 5.81 MIL/uL   Hemoglobin 13.1 13.0 - 17.0 g/dL   HCT 38.9 (L) 39.0 - 52.0 %   MCV 94.6 78.0 - 100.0 fL   MCH 31.9 26.0 - 34.0 pg   MCHC 33.7 30.0 - 36.0 g/dL   RDW 12.6 11.5 - 15.5 %   Platelets 223 150 - 400 K/uL    Comment: Performed at [facility], [place] [address] [place], [place] [address]  Glucose, capillary     Status: Abnormal   Collection Time: 08/11/17  6:37 AM  Result Value Ref Range   Glucose-Capillary 127 (H) 65 - 99 mg/dL   Comment 1 Notify RN    Comment 2 Document in Chart   Glucose, capillary     Status: Abnormal    Collection Time: 08/11/17 11:38 AM  Result Value Ref Range   Glucose-Capillary 220 (H) 65 - 99 mg/dL   Ct Angio Head W Or Wo Contrast  Result Date: 08/10/2017 CLINICAL DATA:  82 y/o  M; left-sided weakness and facial droop. EXAM: CT ANGIOGRAPHY HEAD AND NECK TECHNIQUE: Multidetector CT imaging of the head and neck was performed using the standard protocol during bolus administration of intravenous contrast. Multiplanar CT image reconstructions and MIPs were obtained to evaluate the vascular anatomy. Carotid stenosis measurements (when applicable) are obtained utilizing NASCET criteria, using the distal internal carotid diameter as the denominator. CONTRAST:  69m ISOVUE-370 IOPAMIDOL (ISOVUE-370) INJECTION 76% COMPARISON:  03/21/2017 CTA of head and neck. 03/21/2017 MRI head. 08/09/2017 CT head. FINDINGS: CTA NECK FINDINGS Aortic arch: Three-vessel arch. Moderate calcific atherosclerosis. Partially visualized sternotomy, LIMA bypass, and saphenous grafts. Right carotid system: No evidence of dissection, stenosis (50% or greater) or occlusion. Calcified plaque of the right carotid bifurcation with mild less than 50% proximal ICA stenosis. Left carotid system: No evidence of dissection or occlusion. Calcified plaque of left carotid bifurcation with moderate to severe 70% proximal ICA stenosis. Vertebral arteries: Calcified plaque of bilateral vertebral origin stenosis with mild-to-moderate stenosis. Right dominant system. No additional segment of stenosis, dissection, aneurysm, or occlusion. Skeleton: Mild reversal of cervical curvature and grade 1 anterolisthesis at the C5-6 level. Mild discogenic degenerative changes at C5-C7. No high-grade bony canal stenosis. Other neck: Negative. Upper chest: Negative. Review of the MIP images confirms the above findings CTA HEAD FINDINGS Anterior circulation: No significant stenosis, proximal occlusion, aneurysm, or vascular malformation. Calcified plaque of bilateral  carotid siphons with mild less than 50% stenosis. Posterior circulation: No significant stenosis, proximal occlusion, aneurysm, or vascular malformation. Venous sinuses: As permitted by contrast timing, patent. Anatomic variants: Small anterior communicating artery. No posterior communicating artery identified, likely hypoplastic or absent. Review of the MIP images confirms the above findings IMPRESSION: 1. Patent carotid and vertebral arteries. 2. Patent anterior and posterior intracranial circulation. No large vessel occlusion, aneurysm, or high-grade stenosis. 3. Stable mild less than  50% proximal right ICA stenosis with calcified plaque. 4. Stable moderate to severe 70% proximal left ICA stenosis with calcified plaque. 5. Stable mild less than 50% bilateral carotid siphon stenosis with calcified plaque. These results were called by telephone at the time of interpretation on 08/10/2017 at 12:11 am to Dr. Amie Portland , who verbally acknowledged these results. Electronically Signed   By: Kristine Garbe M.D.   On: 08/10/2017 00:13   Ct Angio Neck W Or Wo Contrast  Result Date: 08/10/2017 CLINICAL DATA:  82 y/o  M; left-sided weakness and facial droop. EXAM: CT ANGIOGRAPHY HEAD AND NECK TECHNIQUE: Multidetector CT imaging of the head and neck was performed using the standard protocol during bolus administration of intravenous contrast. Multiplanar CT image reconstructions and MIPs were obtained to evaluate the vascular anatomy. Carotid stenosis measurements (when applicable) are obtained utilizing NASCET criteria, using the distal internal carotid diameter as the denominator. CONTRAST:  19m ISOVUE-370 IOPAMIDOL (ISOVUE-370) INJECTION 76% COMPARISON:  03/21/2017 CTA of head and neck. 03/21/2017 MRI head. 08/09/2017 CT head. FINDINGS: CTA NECK FINDINGS Aortic arch: Three-vessel arch. Moderate calcific atherosclerosis. Partially visualized sternotomy, LIMA bypass, and saphenous grafts. Right carotid  system: No evidence of dissection, stenosis (50% or greater) or occlusion. Calcified plaque of the right carotid bifurcation with mild less than 50% proximal ICA stenosis. Left carotid system: No evidence of dissection or occlusion. Calcified plaque of left carotid bifurcation with moderate to severe 70% proximal ICA stenosis. Vertebral arteries: Calcified plaque of bilateral vertebral origin stenosis with mild-to-moderate stenosis. Right dominant system. No additional segment of stenosis, dissection, aneurysm, or occlusion. Skeleton: Mild reversal of cervical curvature and grade 1 anterolisthesis at the C5-6 level. Mild discogenic degenerative changes at C5-C7. No high-grade bony canal stenosis. Other neck: Negative. Upper chest: Negative. Review of the MIP images confirms the above findings CTA HEAD FINDINGS Anterior circulation: No significant stenosis, proximal occlusion, aneurysm, or vascular malformation. Calcified plaque of bilateral carotid siphons with mild less than 50% stenosis. Posterior circulation: No significant stenosis, proximal occlusion, aneurysm, or vascular malformation. Venous sinuses: As permitted by contrast timing, patent. Anatomic variants: Small anterior communicating artery. No posterior communicating artery identified, likely hypoplastic or absent. Review of the MIP images confirms the above findings IMPRESSION: 1. Patent carotid and vertebral arteries. 2. Patent anterior and posterior intracranial circulation. No large vessel occlusion, aneurysm, or high-grade stenosis. 3. Stable mild less than 50% proximal right ICA stenosis with calcified plaque. 4. Stable moderate to severe 70% proximal left ICA stenosis with calcified plaque. 5. Stable mild less than 50% bilateral carotid siphon stenosis with calcified plaque. These results were called by telephone at the time of interpretation on 08/10/2017 at 12:11 am to Dr. AAmie Portland, who verbally acknowledged these results. Electronically  Signed   By: LKristine GarbeM.D.   On: 08/10/2017 00:13   Mr Brain Wo Contrast  Result Date: 08/10/2017 CLINICAL DATA:  82y/o M; left-sided weakness and facial droop. History of stroke. EXAM: MRI HEAD WITHOUT CONTRAST TECHNIQUE: Multiplanar, multiecho pulse sequences of the brain and surrounding structures were obtained without intravenous contrast. COMPARISON:  08/09/2017 CT and CTA of the head. 03/21/2017 MRI head. FINDINGS: Brain: Subcentimeter focus of reduced diffusion within the right posterior limb of internal capsule (series 5 image 62, series 6, image 22, series 7, image 53) compatible with acute/early subacute infarction. No associated hemorrhage or mass effect. Stable patchy confluent nonspecific foci of T2 FLAIR hyperintense signal abnormality in subcortical and periventricular white matter are compatible  with advanced chronic microvascular ischemic changes for age. Moderate brain parenchymal volume loss. No new susceptibility hypointensity to indicate interval intracranial hemorrhage. No structural abnormality of the brain. Hippocampi are symmetric in size and signal. No extra-axial collection, hydrocephalus, or effacement of basilar cisterns. Vascular: Normal flow voids. Skull and upper cervical spine: Normal marrow signal. Sinuses/Orbits: Negative. Other: None. IMPRESSION: 1. Subcentimeter acute/early subacute infarction within the right posterior limb of internal capsule. No associated hemorrhage or mass effect. 2. Stable advanced chronic microvascular ischemic changes and moderate parenchymal volume loss of the brain. These results were called by telephone at the time of interpretation on 08/10/2017 at 12:55 am to Dr. Amie Portland , who verbally acknowledged these results. Electronically Signed   By: Kristine Garbe M.D.   On: 08/10/2017 00:56   Ct Head Code Stroke Wo Contrast  Result Date: 08/09/2017 CLINICAL DATA:  Code stroke. 82 y/o M; left-sided weakness and facial  droop. EXAM: CT HEAD WITHOUT CONTRAST TECHNIQUE: Contiguous axial images were obtained from the base of the skull through the vertex without intravenous contrast. COMPARISON:  03/22/2017 CT head.  03/21/2017 MRI head. FINDINGS: Brain: No evidence of acute infarction, hemorrhage, hydrocephalus, extra-axial collection or mass lesion/mass effect. Stable chronic microvascular ischemic changes and parenchymal volume loss of the brain. Vascular: Calcific atherosclerosis of carotid siphons. No hyperdense vessel identified. Skull: Normal. Negative for fracture or focal lesion. Sinuses/Orbits: No acute finding. Other: Bilateral intra-ocular lens replacement. ASPECTS [facility] Stroke Program Early CT Score) - Ganglionic level infarction (caudate, lentiform nuclei, internal capsule, insula, M1-M3 cortex): 7 - Supraganglionic infarction (M4-M6 cortex): 3 Total score (0-10 with 10 being normal): 10 IMPRESSION: 1. No acute intracranial abnormality identified. 2. ASPECTS is 10 3. Stable chronic microvascular ischemic changes and parenchymal volume loss of the brain. These results were communicated to Dr. Rory Percy at 11:50 pmon 5/29/2019by text page via the [facility] messaging system. Electronically Signed   By: Kristine Garbe M.D.   On: 08/09/2017 23:51       Medical Problem List and Plan: 1.  Dense left-sided weakness with dysarthria and dysphasia secondary to right PLIC infarction.  Aspirin and Plavix x3 weeks then aspirin alone  -admit to inpatient rehab 2.  DVT Prophylaxis/Anticoagulation: Subcutaneous Lovenox 3. Pain Management: Tylenol as needed 4. Mood: Provide emotional support 5. Neuropsych: This patient is capable of making decisions on his own behalf. 6. Skin/Wound Care: Routine skin checks 7. Fluids/Electrolytes/Nutrition: Routine in and outs with follow-up chemistries 8.  CAD with CABG.  Continue aspirin and Plavix.  No chest pain or shortness of breath 9.  Diabetes mellitus with peripheral  neuropathy.  Hemoglobin A1c 6.0.  SSI.  Check blood sugars before meals and at bedtime.  Glucophage 1000 mg twice daily, Amaryl 4 mg daily, Lantus insulin 10 units nightly, Tradjenta 5 mg daily 10.  Dysphagia.  Dysphasia #2 nectar liquids.  Follow-up speech therapy 11.  Hypertension.  Cozaar 25 mg daily, Toprol 50 mg daily 12.  Diastolic congestive heart failure.  Monitor for any signs of fluid overload.  Patient was taking Lasix 40 mg daily if needed for fluid retention prior to admission. 13.  History of seizure disorder.  Depakote 500 mg daily. 14.  Hyperlipidemia.  Lipitor 15.  History of alcohol use.  Monitor for withdrawal  Post Admission Physician Evaluation: 1. Functional deficits secondary  to right PLIC infarct. 2. Patient is admitted to receive collaborative, interdisciplinary care between the physiatrist, rehab nursing staff, and therapy team. 3. Patient's level of medical complexity and  substantial therapy needs in context of that medical necessity cannot be provided at a lesser intensity of care such as a SNF. 4. Patient has experienced substantial functional loss from his/her baseline which was documented above under the "Functional History" and "Functional Status" headings.  Judging by the patient's diagnosis, physical exam, and functional history, the patient has potential for functional progress which will result in measurable gains while on inpatient rehab.  These gains will be of substantial and practical use upon discharge  in facilitating mobility and self-care at the household level. 5. Physiatrist will provide 24 hour management of medical needs as well as oversight of the therapy plan/treatment and provide guidance as appropriate regarding the interaction of the two. 6. The Preadmission Screening has been reviewed and patient status is unchanged unless otherwise stated above. 7. 24 hour rehab nursing will assist with bladder management, bowel management, safety, skin/wound  care, disease management, medication administration, pain management and patient education  and help integrate therapy concepts, techniques,education, etc. 8. PT will assess and treat for/with: Lower extremity strength, range of motion, stamina, balance, functional mobility, safety, adaptive techniques and equipment, NMR, family education, orthotic.   Goals are: min assist . 9. OT will assess and treat for/with: ADL's, functional mobility, safety, upper extremity strength, adaptive techniques and equipment, NMR, family education, orthotics.   Goals are: min assist. Therapy may proceed with showering this patient. 10. SLP will assess and treat for/with: speech, communication.  Goals are: mod I. 11. Case Management and Social Worker will assess and treat for psychological issues and discharge planning. 12. Team conference will be held weekly to assess progress toward goals and to determine barriers to discharge. 13. Patient will receive at least 3 hours of therapy per day at least 5 days per week. 14. ELOS: 20-28 days       15. Prognosis:  excellent   I have personally performed a face to face diagnostic evaluation of this patient and formulated the key components of the plan.  Additionally, I have personally reviewed laboratory data, imaging studies, as well as relevant notes and concur with the physician assistant's documentation above.  Meredith Staggers, MD, FAAPMR   Lavon Paganini Kingfisher, PA-C 08/11/2017

## 2017-08-11 NOTE — Progress Notes (Signed)
Physical Therapy Treatment Patient Details Name: Fred [name] MRN: [id number] DOB: April 07, 1935 Today's Date: 08/11/2017    History of Present Illness Pt is an 82 y/o male admitted secondary to L sided weakness. Imaging revealed acute/early subacute infarct in the R posterior limb of the internal capsule. PMH includes CAD s/p CABG, TIA, HTN, a fib, CHF, and DM.     PT Comments    Patient seen for transfer back to bed from recliner. NT assisted. Pt required mod/max A +2 with use of Stedy for STS X2. Current plan remains appropriate.    Follow Up Recommendations  CIR;Supervision for mobility/OOB     Equipment Recommendations  None recommended by PT    Recommendations for Other Services Rehab consult     Precautions / Restrictions Precautions Precautions: Fall Restrictions Weight Bearing Restrictions: No    Mobility  Bed Mobility Overal bed mobility: Needs Assistance Bed Mobility: Sit to Supine     Supine to sit: Mod assist;+2 for physical assistance;+2 for safety/equipment;HOB elevated Sit to supine: Max assist   General bed mobility comments: asssist to lower trunk and to bring bilat LE into bed; cues for sequencing   Transfers Overall transfer level: Needs assistance Equipment used: 2 person hand held assist;Ambulation equipment used Transfers: Sit to/from Stand Sit to Stand: +2 physical assistance;+2 safety/equipment;Mod assist;Max assist Stand pivot transfers: Total assist;+2 physical assistance;+2 safety/equipment       General transfer comment: initial stand from recliner mod A +2 and max A +2 second trial; cues for sequencing and facilitation at glutes for upright posture and multimodal cues for midline posture  Ambulation/Gait                 Stairs             Wheelchair Mobility    Modified Rankin (Stroke Patients Only) Modified Rankin (Stroke Patients Only) Pre-Morbid Rankin Score: No symptoms Modified Rankin: Moderately severe  disability     Balance Overall balance assessment: Needs assistance Sitting-balance support: Bilateral upper extremity supported;Feet supported Sitting balance-Leahy Scale: Poor Sitting balance - Comments: L lateral lean in sitting. Required min to mod A for sitting balance, verbal cues to correct posture  Postural control: Left lateral lean   Standing balance-Leahy Scale: Zero                              Cognition Arousal/Alertness: Awake/alert Behavior During Therapy: WFL for tasks assessed/performed Overall Cognitive Status: Impaired/Different from baseline Area of Impairment: Awareness;Following commands                       Following Commands: Follows one step commands consistently   Awareness: Emergent          Exercises      General Comments General comments (skin integrity, edema, etc.): pt with very supportive family present at start of session       Pertinent Vitals/Pain Pain Assessment: No/denies pain    Home Living                      Prior Function            PT Goals (current goals can now be found in the care plan section) Acute Rehab PT Goals Patient Stated Goal: to regain independence, and use of L side  PT Goal Formulation: With patient/family Time For Goal Achievement: 08/24/17 Potential to Achieve Goals: Good Progress towards  PT goals: Progressing toward goals    Frequency    Min 4X/week      PT Plan Current plan remains appropriate    Co-evaluation PT/OT/SLP Co-Evaluation/Treatment: Yes Reason for Co-Treatment: Complexity of the patient's impairments (multi-system involvement);For patient/therapist safety;To address functional/ADL transfers PT goals addressed during session: Mobility/safety with mobility OT goals addressed during session: Proper use of Adaptive equipment and DME;Strengthening/ROM      AM-PAC PT "6 Clicks" Daily Activity  Outcome Measure  Difficulty turning over in bed  (including adjusting bedclothes, sheets and blankets)?: Unable Difficulty moving from lying on back to sitting on the side of the bed? : Unable Difficulty sitting down on and standing up from a chair with arms (e.g., wheelchair, bedside commode, etc,.)?: Unable Help needed moving to and from a bed to chair (including a wheelchair)?: A Lot Help needed walking in hospital room?: Total Help needed climbing 3-5 steps with a railing? : Total 6 Click Score: 7    End of Session Equipment Utilized During Treatment: Gait belt Activity Tolerance: Patient tolerated treatment well Patient left: with call bell/phone within reach;in bed;with nursing/sitter in room Nurse Communication: Mobility status PT Visit Diagnosis: Muscle weakness (generalized) (M62.81);Hemiplegia and hemiparesis;Difficulty in walking, not elsewhere classified (R26.2) Hemiplegia - Right/Left: Left Hemiplegia - caused by: Cerebral infarction     Time: 1458-1510 PT Time Calculation (min) (ACUTE ONLY): 12 min  Charges:  $Therapeutic Activity: 8-22 mins                    G Codes:       Fred [name], PTA Pager: [phone]     Fred [name] 08/11/2017, 4:40 PM

## 2017-08-11 NOTE — PMR Pre-admission (Signed)
PMR Admission Coordinator Pre-Admission Assessment  Patient: Fred [name] is an 82 y.o., male MRN: [id number] DOB: [birth year] Height: 5\' 3"  (160 cm) Weight: 84.4 kg (186 lb)              Insurance Information HMO: *No    PPO:       PCP:       IPA:       80/20:       OTHER:   PRIMARY:  Medicare A and B      Policy#: [id number]      Subscriber: patient CM Name:        Phone#:       Fax#:   Pre-Cert#:        Employer: Retired Benefits:  Phone #:       Name: Checked in [place] [employer] source Eff. Date: 03/14/02     Deduct: $1364      Out of Pocket Max: None      Life Max: N/A CIR: 1005      SNF: 100 days Outpatient: 80%     Co-Pay: 20% Home Health: 100%      Co-Pay: none DME: 80%     Co-Pay: 20% Providers: patient's choice  SECONDARY: UHC      Policy#: [id number]      Subscriber:  patient CM Name:        Phone#:       Fax#:   Pre-Cert#:        Employer: Retired Benefits:  Phone #: [phone]     Name:   Eff. 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 right-handed male with history of hypertension, seizure with history of CVA with little residual January 2019 and received TPA, CAD with CABG maintained on aspirin, atrial fibrillation, diastolic congestive heart failure.  Per chart review patient lives with spouse.  Independent still active.  Two-level home with bedroom on Main and 2 steps to entry.  Presented 08/09/2017 with left-sided weakness facial droop and slurred speech.  Cranial CT scan  negative.    CT angiogram of head and neck showed mild less than 50% proximal right ICA stenosis and moderate to severe 70% proximal left ICA stenosis.  MRI showed subcentimeter acute versus early subacute infarction within the right posterior limb of internal capsule.  Echocardiogram with ejection fraction of 97% grade 1 diastolic dysfunction.  Currently on aspirin and Plavix for CVA prophylaxis.  Subcutaneous Lovenox for DVT prophylaxis.  Await plan for formal swallow study.  Physical therapy evaluation completed with recommendations of physical medicine rehab consult.   Total: 8=NIH  Past Medical History  Past Medical History:  Diagnosis Date  . Atrial fibrillation ([place])    Post operative  . CAD (  coronary artery disease)    s/p cabg  . CHF (congestive heart failure) ([place])   . Diverticulosis   . DJD (degenerative joint disease)   . DM (diabetes mellitus) ([place])   . History of anemia   . History of stroke   . Hyperlipidemia   . Hypertension   . 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 Peripheral vascular disease ([place])   . Polyneuropathy, diabetic ([place])   . Seizure disorder, complex partial ([place])   . Syncope and collapse     Family History  family history includes Epilepsy in his father; Lung cancer in his unknown relative; Stroke in his mother.  Prior Rehab/Hospitalizations: No previous rehab  Has the patient had major surgery during 100 days prior to admission? No  Current Medications   Current Facility-Administered Medications:  .  0.9 %  sodium chloride infusion, , Intravenous, Continuous, Rondel Jumbo, PA-C, Last Rate: 50 mL/hr at 08/10/17 1727 .  acetaminophen (TYLENOL) tablet 650 mg, 650 mg, Oral, Q4H PRN **OR** acetaminophen (TYLENOL) solution 650 mg, 650 mg, Per Tube, Q4H PRN **OR** acetaminophen (TYLENOL) suppository 650 mg, 650 mg, Rectal, Q4H PRN, Wertman, Sara E, PA-C .  albuterol (PROVENTIL) (2.5 MG/3ML) 0.083% nebulizer solution 2.5 mg, 2.5 mg,  Nebulization, Q6H PRN, Wertman, Sara E, PA-C .  allopurinol (ZYLOPRIM) tablet 300 mg, 300 mg, Oral, Daily, Wertman, Sara E, PA-C, 300 mg at 08/11/17 1042 .  [START ON 08/12/2017] aspirin EC tablet 81 mg, 81 mg, Oral, Daily, Gherghe, Costin M, MD .  atorvastatin (LIPITOR) tablet 80 mg, 80 mg, Oral, Daily, Rondel Jumbo, PA-C, 80 mg at 08/11/17 1043 .  clopidogrel (PLAVIX) tablet 75 mg, 75 mg, Oral, Daily, Biby, Sharon L, NP, 75 mg at 08/11/17 1043 .  divalproex (DEPAKOTE ER) 24 hr tablet 500 mg, 500 mg, Oral, Daily, Rondel Jumbo, PA-C, 500 mg at 08/11/17 1043 .  enoxaparin (LOVENOX) injection 40 mg, 40 mg, Subcutaneous, Q24H, Wertman, Coralee Pesa, PA-C, 40 mg at 08/11/17 1312 .  folic acid (FOLVITE) tablet 1 mg, 1 mg, Oral, Daily, Karmen Bongo, MD, 1 mg at 08/11/17 1043 .  hydrALAZINE (APRESOLINE) injection 5 mg, 5 mg, Intravenous, Q6H PRN, Shawn Route, Coralee Pesa, PA-C .  HYDROcodone-acetaminophen (NORCO/VICODIN) 5-325 MG per tablet 1-2 tablet, 1-2 tablet, Oral, Q4H PRN, Shawn Route, Sara E, PA-C .  insulin aspart (novoLOG) injection 0-9 Units, 0-9 Units, Subcutaneous, TID WC, Wertman, Coralee Pesa, PA-C, 3 Units at 08/11/17 1312 .  insulin glargine (LANTUS) injection 10 Units, 10 Units, Subcutaneous, Q2200, Karmen Bongo, MD, 10 Units at 08/10/17 2243 .  LORazepam (ATIVAN) tablet 1 mg, 1 mg, Oral, Q6H PRN **OR** LORazepam (ATIVAN) injection 1 mg, 1 mg, Intravenous, Q6H PRN, Karmen Bongo, MD .  multivitamin with minerals tablet 1 tablet, 1 tablet, Oral, Daily, Karmen Bongo, MD, 1 tablet at 08/11/17 1043 .  niacin (NIASPAN) CR tablet 500 mg, 500 mg, Oral, QHS, Wertman, Sara E, PA-C .  ondansetron (ZOFRAN) injection 4 mg, 4 mg, Intravenous, Q8H PRN, Rondel Jumbo, PA-C .  RESOURCE THICKENUP CLEAR, , Oral, PRN, Karmen Bongo, MD .  thiamine 500mg  in normal saline (52ml) IVPB, 500 mg, Intravenous, TID, Karmen Bongo, MD, Last Rate: 100 mL/hr at 08/11/17 1312, 500 mg at 08/11/17 1312 .  zolpidem (AMBIEN)  tablet 5 mg, 5 mg, Oral, QHS PRN, Rondel Jumbo, PA-C  Patients Current Diet:  Diet Order           DIET DYS 2 Room service appropriate?  Yes; Fluid consistency: Nectar Thick  Diet effective now          Precautions / Restrictions Precautions Precautions: Fall Restrictions Weight Bearing Restrictions: No   Has the patient had 2 or more falls or a fall with injury in the past year?No  Prior Activity Level Community (5-7x/wk): Went out daily, was driving.  Home Assistive Devices / Equipment Home Assistive Devices/Equipment: CBG Meter Home Equipment: Cane - quad, Bedside commode, Grab bars - tub/shower, Hand held shower head, Tub bench  Prior Device Use: Indicate devices/aids used by the patient prior to current illness, exacerbation or injury? None  Prior Functional Level Prior Function Level of Independence: Independent Comments: Was still very active at home, performing ADLs, IADLs, chopping wood, etc.   Self Care: Did the patient need help bathing, dressing, using the toilet or eating?  Independent  Indoor Mobility: Did the patient need assistance with walking from room to room (with or without device)? Independent  Stairs: Did the patient need assistance with internal or external stairs (with or without device)? Independent  Functional Cognition: Did the patient need help planning regular tasks such as shopping or remembering to take medications? Independent  Current Functional Level Cognition  Overall Cognitive Status: Impaired/Different from baseline Orientation Level: Oriented X4 Following Commands: Follows one step commands consistently    Extremity Assessment (includes Sensation/Coordination)  Upper Extremity Assessment: LUE deficits/detail LUE Deficits / Details: flaccid LUE, pt denies numbness/tingling  LUE Coordination: decreased fine motor, decreased gross motor  Lower Extremity Assessment: Defer to PT evaluation LLE Deficits / Details: LLE grossly  2/5. Family reports weakness is worse; notified RN.     ADLs  Overall ADL's : Needs assistance/impaired Eating/Feeding: Minimal assistance, Sitting Grooming: Minimal assistance, Moderate assistance, Sitting, Wash/dry face Grooming Details (indicate cue type and reason): min-modA for sitting balance EOB while pt wiping face Upper Body Bathing: Moderate assistance, Sitting Lower Body Bathing: Maximal assistance, +2 for physical assistance, +2 for safety/equipment, Sit to/from stand Upper Body Dressing : Moderate assistance, Sitting Lower Body Dressing: Maximal assistance, +2 for physical assistance, +2 for safety/equipment, Sit to/from stand Toilet Transfer: Maximal assistance, +2 for physical assistance, +2 for safety/equipment, Stand-pivot, BSC Toilet Transfer Details (indicate cue type and reason): simulated in transfer to recliner; use of Stedy for transfers this session  Toileting- Clothing Manipulation and Hygiene: Maximal assistance, +2 for physical assistance, +2 for safety/equipment, Sit to/from stand Functional mobility during ADLs: Maximal assistance, +2 for physical assistance, +2 for safety/equipment(use of Stedy for stand pivot transfer ) General ADL Comments: pt completing sit<>stand at EOB, additional sit<>stand at [facility] with maxA+2, pt able to maintain standing approx 1-2 min prior to sitting at [facility] with MaxA+2 to maintain standing balance, cues to lean R due to strong L lateral lean     Mobility  Overal bed mobility: Needs Assistance Bed Mobility: Supine to Sit Supine to sit: Mod assist, +2 for physical assistance, +2 for safety/equipment, HOB elevated Sit to supine: Max assist General bed mobility comments: moving to R side of bed, assist for LLE and to scoot hips towards EOB with use of bed pad, pt using RUE and bedrail to assist, almost able to fully bring trunk upright into sitting, requires assist to fully achieve upright position     Transfers  Overall transfer level:  Needs assistance Equipment used: 2 person hand held assist, Ambulation equipment used Transfer via Lift Equipment: Stedy Transfers: Sit to/from Stand, Risk manager Sit to Stand: Max assist, +2 physical assistance, +2  safety/equipment Stand pivot transfers: Total assist, +2 physical assistance, +2 safety/equipment General transfer comment: MaxA+2 initially with +2 HHA to rise and steady with L knee blocked, verbal cues for bringing trunk upright and to maintain midline as pt tends to lean L; completed additional sit<>stand at [facility] with MaxA+2, use of Stedy to transfer to recliner     Ambulation / Gait / Stairs / Office manager / Balance Dynamic Sitting Balance Sitting balance - Comments: L lateral lean in sitting. Required min to mod A for sitting balance, verbal cues to correct posture  Balance Overall balance assessment: Needs assistance Sitting-balance support: Bilateral upper extremity supported, Feet supported Sitting balance-Leahy Scale: Poor Sitting balance - Comments: L lateral lean in sitting. Required min to mod A for sitting balance, verbal cues to correct posture  Postural control: Left lateral lean Standing balance-Leahy Scale: Zero    Special needs/care consideration BiPAP/CPAP No CPM No Continuous Drip IV 0.9% NS 50 mL/hr Dialysis No       Life Vest No Oxygen No Special Bed No Trach Size No Wound Vac (area) No    Skin No                            Bowel mgmt: Last BM 08/10/17 Bladder mgmt: Condom catheter Diabetic mgmt: Yes, on oral medication and insulin at home    Previous Home Environment Living Arrangements: Spouse/significant other, Children Available Help at Discharge: Family, Available 24 hours/day Type of Home: House Home Layout: Two level, Able to live on main level with bedroom/bathroom Home Access: Stairs to enter Entrance Stairs-Rails: None Entrance Stairs-Number of Steps: 2 Bathroom Shower/Tub: Tub/shower unit,  Architectural technologist: Standard Home Care Services: No  Discharge Living Setting Plans for Discharge Living Setting: Patient's home, House, Lives with (comment)(Lives with wife.) Type of Home at Discharge: House Discharge Home Layout: Two level, Able to live on main level with bedroom/bathroom Alternate Level Stairs-Number of Steps: Flight Discharge Home Access: Stairs to enter Technical brewer of Steps: 2 steps Does the patient have any problems obtaining your medications?: No  Social/Family/Support Systems Patient Roles: Spouse, Parent(Has a wife and 3 daughters.) Contact Information: [place] Savini - wife Anticipated Caregiver: Wife, daughters, grand daughter Anticipated Caregiver's Contact Information: En[place] Derry - wife - [phone]; Jermane Brayboy daughter - [phone] Ability/Limitations of Caregiver: Wife has Lewybody dementia.  Daughter lives beside patient and niece lives behind patient.  Youngest daughter is not working and may be able to assist. Caregiver Availability: 24/7 Discharge Plan Discussed with Primary Caregiver: Yes Is Caregiver In Agreement with Plan?: Yes Does Caregiver/Family have Issues with Lodging/Transportation while Pt is in Rehab?: No  Goals/Additional Needs Patient/Family Goal for Rehab: PT/OT supervision to min assist, SLP mod I goals Expected length of stay: 20-28 days Cultural Considerations: [employer] Dietary Needs: Dys 2, nectar thick liquids Equipment Needs: TBD Pt/Family Agrees to Admission and willing to participate: Yes Program Orientation Provided & Reviewed with Pt/Caregiver Including Roles  & Responsibilities: Yes  Decrease burden of Care through IP rehab admission: N/A  Possible need for SNF placement upon discharge: Not planned  Patient Condition: This patient's condition remains as documented in the consult dated 08/11/17, in which the Rehabilitation Physician determined and documented that the patient's condition is  appropriate for intensive rehabilitative care in an inpatient rehabilitation facility. Will admit to inpatient rehab today.  Preadmission Screen Completed By:  Retta Diones, 08/11/2017 3:18  PM ______________________________________________________________________   Discussed status with Dr. Naaman Plummer on 08/11/17 at 1517 and received telephone approval for admission today.  Admission Coordinator:  Retta Diones, time 1518/Date 08/11/17

## 2017-08-11 NOTE — Consult Note (Signed)
Physical Medicine and Rehabilitation Consult Reason for Consult: Left side weakness facial droop and slurred speech Referring Physician: [facility]   HPI: Fred [name] is a 82 y.o. right-handed male with history of hypertension, seizure with history of CVA with little residual January 2019 and received TPA, CAD with CABG maintained on aspirin, atrial fibrillation, diastolic congestive heart failure.  Per chart review patient lives with spouse.  Independent still active.  Two-level home with bedroom on [address] and 2 steps to entry.  Presented 08/09/2017 with left-sided weakness facial droop and slurred speech.  Cranial CT scan negative.    CT angiogram of head and neck showed mild less than 50% proximal right ICA stenosis and moderate to severe 70% proximal left ICA stenosis.  MRI showed subcentimeter acute versus early subacute infarction within the right posterior limb of internal capsule.  Echocardiogram with ejection fraction of 28% grade 1 diastolic dysfunction.  Currently on aspirin and Plavix for CVA prophylaxis.  Subcutaneous Lovenox for DVT prophylaxis.  Await plan for formal swallow study.  Physical therapy evaluation completed with recommendations of physical medicine rehab consult.   Review of Systems  Constitutional: Negative for chills and fever.  HENT: Negative for hearing loss.   Eyes: Negative for blurred vision and double vision.  Respiratory: Negative for cough and shortness of breath.   Cardiovascular: Positive for palpitations. Negative for chest pain and leg swelling.  Gastrointestinal: Positive for constipation. Negative for nausea and vomiting.  Genitourinary: Positive for urgency. Negative for dysuria and hematuria.  Musculoskeletal: Negative for joint pain and myalgias.  Skin: Negative for rash.  Neurological: Positive for speech change, focal weakness and seizures.  All other systems reviewed and are negative.  Past Medical History:  Diagnosis Date  . Atrial  fibrillation ([place])    Post operative  . CAD (coronary artery disease)    s/p cabg  . CHF (congestive heart failure) ([place])   . Diverticulosis   . DJD (degenerative joint disease)   . DM (diabetes mellitus) ([place])   . History of anemia   . History of stroke   . Hyperlipidemia   . Hypertension   . 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 Peripheral vascular disease ([place])   . Polyneuropathy, diabetic ([place])   . Seizure disorder, complex partial ([place])   . Syncope and collapse    Past Surgical History:  Procedure Laterality Date  . CATARACT EXTRACTION W/ INTRAOCULAR LENS  IMPLANT, BILATERAL  06/2013   Dr. [name]  . CORONARY ARTERY BYPASS GRAFT  02/13/2004   4 vesel  . removal of sebaceous cyst from neck  08/2007   Dr. [name]  . TONSILLECTOMY     Family History  Problem Relation Age of Onset  . Epilepsy Father   . Stroke Mother   . Lung cancer Unknown    Social History:  reports that he quit smoking about 40 years ago. His smoking use included cigarettes. He has a 22.00 pack-year smoking history. He has never used smokeless tobacco. He reports that he does not drink alcohol or use drugs. Allergies:  Allergies  Allergen Reactions  . Lisinopril Cough   Medications Prior to Admission  Medication Sig Dispense Refill  . allopurinol (ZYLOPRIM) 300 MG tablet Take 1 tablet (300 mg total) by mouth daily. 90 tablet 4  . aspirin 325 MG tablet Take 325 mg by mouth daily.    Marland Kitchen atorvastatin (LIPITOR) 40 MG tablet Take 1 tablet (40 mg total) by  mouth daily. 90 tablet 1  . Coenzyme Q10 (COQ10) 30 MG CAPS Take 1 tablet by mouth daily.      . divalproex (DEPAKOTE ER) 500 MG 24 hr tablet Take 1 tablet (500 mg total) by mouth daily. 30 tablet 11  . ezetimibe (ZETIA) 10 MG tablet Take 1 tablet (10 mg total) by mouth daily. 90 tablet 3  . furosemide (LASIX) 40 MG tablet TAKE 1 TABLET BY MOUTH EVERY DAY AS NEEDED FOR FLUID RETENTION (Patient taking differently: TAKE 1 TABLET  (40mg ) BY MOUTH EVERY DAY AS NEEDED FOR FLUID RETENTION) 90 tablet 1  . glimepiride (AMARYL) 4 MG tablet Take 1 tablet (4 mg total) by mouth daily with breakfast. 90 tablet 3  . Insulin Glargine (LANTUS SOLOSTAR) 100 UNIT/ML Solostar Pen Inject 10-20 Units into the skin daily at 10 pm. (Patient taking differently: Inject 10 Units into the skin daily at 10 pm. ) 15 mL 11  . ipratropium (ATROVENT) 0.03 % nasal spray USE 2 SPRAYS IN EACH NOSTRIL EVERY 12 HOURS 30 mL 3  . losartan (COZAAR) 25 MG tablet Take 1 tablet (25 mg total) by mouth daily. 90 tablet 1  . metFORMIN (GLUCOPHAGE) 1000 MG tablet Take 1 tablet (1,000 mg total) by mouth 2 (two) times daily with a meal. 180 tablet 3  . metoprolol succinate (TOPROL-XL) 50 MG 24 hr tablet TAKE 1 TABLET BY MOUTH EVERY DAY (Patient taking differently: TAKE 1 TABLET (50mg ) BY MOUTH EVERY DAY) 90 tablet 1  . niacin (NIASPAN) 500 MG CR tablet Take 1 tablet (500 mg total) by mouth at bedtime. 90 tablet 3  . sitaGLIPtin (JANUVIA) 100 MG tablet Take 100 mg by mouth daily.    . celecoxib (CELEBREX) 200 MG capsule One to 2 tablets by mouth daily as needed for pain. (Patient not taking: Reported on 08/09/2017) 60 capsule 2  . Hyaluronan (ORTHOVISC) 30 MG/2ML SOSY Inject 1 Syringe into the articular space once a week. (Patient not taking: Reported on 08/09/2017) 8 Syringe 0  . linagliptin (TRADJENTA) 5 MG TABS tablet Take 1 tablet (5 mg total) by mouth daily. insuranc would not cover [place] (Patient not taking: Reported on 08/09/2017) 30 tablet 5    Home: [place] expects to be discharged to:: Private residence Living Arrangements: Spouse/significant other, Children Available Help at Discharge: Family, Available 24 hours/day Type of Home: House Home Access: Stairs to enter [employer] of Steps: 2 Entrance Stairs-Rails: None Home Layout: Two level, Able to live on main level with bedroom/bathroom Bathroom Shower/Tub: Tub/shower unit,  Architectural technologist: Standard Home Equipment: Cane - quad, Bedside commode, Grab bars - tub/shower, Hand held shower head, Tub bench  Functional History: Prior Function Level of Independence: Independent Comments: Was still very active at home, performing ADLs, IADLs, chopping wood, etc.  Functional Status:  Mobility: Bed Mobility Overal bed mobility: Needs Assistance Bed Mobility: Supine to Sit, Sit to Supine Supine to sit: Max assist Sit to supine: Max assist General bed mobility comments: Pt requiring max A for assist with moving LEs and trunk elevation to come up to sitting. Increased L lateral lean in sitting and required from min to mod A to maintain balance. Max A to return to supine.  Transfers General transfer comment: Deferred       ADL:    Cognition: Cognition Overall Cognitive Status: Within Functional Limits for tasks assessed Orientation Level: Oriented X4 Cognition Arousal/Alertness: Awake/alert Behavior During Therapy: WFL for tasks assessed/performed Overall Cognitive Status: Within Functional Limits  for tasks assessed  Blood pressure (!) 153/76, pulse 85, temperature 98.3 F (36.8 C), temperature source Oral, resp. rate 18, height 5\' 3"  (1.6 m), weight 84.4 kg (186 lb), SpO2 95 %. Physical Exam  Constitutional: He is oriented to person, place, and time. He appears well-developed and well-nourished.  HENT:  Head: Normocephalic.  edentulous  Eyes: Pupils are equal, round, and reactive to light.  Neck: Normal range of motion.  Cardiovascular: Normal rate.  Respiratory: Effort normal.  GI: Soft.  Musculoskeletal: He exhibits no tenderness.  Neurological: He is alert and oriented to person, place, and time.  Dense left upper and lower ext weakness (0/5). RUE and RLE 5/5. Sensation intact. Left central 7 and tongue deviation.   Psychiatric: He has a normal mood and affect. His behavior is normal. Judgment and thought content normal.    Results for  orders placed or performed during the hospital encounter of 08/09/17 (from the past 24 hour(s))  CBG monitoring, ED     Status: Abnormal   Collection Time: 08/10/17 12:34 PM  Result Value Ref Range   Glucose-Capillary 140 (H) 65 - 99 mg/dL  TSH     Status: None   Collection Time: 08/10/17 12:47 PM  Result Value Ref Range   TSH 2.804 0.350 - 4.500 uIU/mL  CBG monitoring, ED     Status: Abnormal   Collection Time: 08/10/17  5:03 PM  Result Value Ref Range   Glucose-Capillary 155 (H) 65 - 99 mg/dL  Basic metabolic panel     Status: Abnormal   Collection Time: 08/11/17  4:54 AM  Result Value Ref Range   Sodium 140 135 - 145 mmol/L   Potassium 4.1 3.5 - 5.1 mmol/L   Chloride 112 (H) 101 - 111 mmol/L   CO2 24 22 - 32 mmol/L   Glucose, Bld 127 (H) 65 - 99 mg/dL   BUN 16 6 - 20 mg/dL   Creatinine, Ser 1.08 0.61 - 1.24 mg/dL   Calcium 9.1 8.9 - 10.3 mg/dL   GFR calc non Af Amer >60 >60 mL/min   GFR calc Af Amer >60 >60 mL/min   Anion gap 4 (L) 5 - 15  CBC     Status: Abnormal   Collection Time: 08/11/17  4:54 AM  Result Value Ref Range   WBC 7.2 4.0 - 10.5 K/uL   RBC 4.11 (L) 4.22 - 5.81 MIL/uL   Hemoglobin 13.1 13.0 - 17.0 g/dL   HCT 38.9 (L) 39.0 - 52.0 %   MCV 94.6 78.0 - 100.0 fL   MCH 31.9 26.0 - 34.0 pg   MCHC 33.7 30.0 - 36.0 g/dL   RDW 12.6 11.5 - 15.5 %   Platelets 223 150 - 400 K/uL  Glucose, capillary     Status: Abnormal   Collection Time: 08/11/17  6:37 AM  Result Value Ref Range   Glucose-Capillary 127 (H) 65 - 99 mg/dL   Comment 1 Notify RN    Comment 2 Document in Chart    Ct Angio Head W Or Wo Contrast  Result Date: 08/10/2017 CLINICAL DATA:  82 y/o  M; left-sided weakness and facial droop. EXAM: CT ANGIOGRAPHY HEAD AND NECK TECHNIQUE: Multidetector CT imaging of the head and neck was performed using the standard protocol during bolus administration of intravenous contrast. Multiplanar CT image reconstructions and MIPs were obtained to evaluate the vascular  anatomy. Carotid stenosis measurements (when applicable) are obtained utilizing NASCET criteria, using the distal internal carotid diameter as the denominator.  CONTRAST:  6mL ISOVUE-370 IOPAMIDOL (ISOVUE-370) INJECTION 76% COMPARISON:  03/21/2017 CTA of head and neck. 03/21/2017 MRI head. 08/09/2017 CT head. FINDINGS: CTA NECK FINDINGS Aortic arch: Three-vessel arch. Moderate calcific atherosclerosis. Partially visualized sternotomy, LIMA bypass, and saphenous grafts. Right carotid system: No evidence of dissection, stenosis (50% or greater) or occlusion. Calcified plaque of the right carotid bifurcation with mild less than 50% proximal ICA stenosis. Left carotid system: No evidence of dissection or occlusion. Calcified plaque of left carotid bifurcation with moderate to severe 70% proximal ICA stenosis. Vertebral arteries: Calcified plaque of bilateral vertebral origin stenosis with mild-to-moderate stenosis. Right dominant system. No additional segment of stenosis, dissection, aneurysm, or occlusion. Skeleton: Mild reversal of cervical curvature and grade 1 anterolisthesis at the C5-6 level. Mild discogenic degenerative changes at C5-C7. No high-grade bony canal stenosis. Other neck: Negative. Upper chest: Negative. Review of the MIP images confirms the above findings CTA HEAD FINDINGS Anterior circulation: No significant stenosis, proximal occlusion, aneurysm, or vascular malformation. Calcified plaque of bilateral carotid siphons with mild less than 50% stenosis. Posterior circulation: No significant stenosis, proximal occlusion, aneurysm, or vascular malformation. Venous sinuses: As permitted by contrast timing, patent. Anatomic variants: Small anterior communicating artery. No posterior communicating artery identified, likely hypoplastic or absent. Review of the MIP images confirms the above findings IMPRESSION: 1. Patent carotid and vertebral arteries. 2. Patent anterior and posterior intracranial  circulation. No large vessel occlusion, aneurysm, or high-grade stenosis. 3. Stable mild less than 50% proximal right ICA stenosis with calcified plaque. 4. Stable moderate to severe 70% proximal left ICA stenosis with calcified plaque. 5. Stable mild less than 50% bilateral carotid siphon stenosis with calcified plaque. These results were called by telephone at the time of interpretation on 08/10/2017 at 12:11 am to Dr. Amie Portland , who verbally acknowledged these results. Electronically Signed   By: Kristine Garbe M.D.   On: 08/10/2017 00:13   Ct Angio Neck W Or Wo Contrast  Result Date: 08/10/2017 CLINICAL DATA:  81 y/o  M; left-sided weakness and facial droop. EXAM: CT ANGIOGRAPHY HEAD AND NECK TECHNIQUE: Multidetector CT imaging of the head and neck was performed using the standard protocol during bolus administration of intravenous contrast. Multiplanar CT image reconstructions and MIPs were obtained to evaluate the vascular anatomy. Carotid stenosis measurements (when applicable) are obtained utilizing NASCET criteria, using the distal internal carotid diameter as the denominator. CONTRAST:  62mL ISOVUE-370 IOPAMIDOL (ISOVUE-370) INJECTION 76% COMPARISON:  03/21/2017 CTA of head and neck. 03/21/2017 MRI head. 08/09/2017 CT head. FINDINGS: CTA NECK FINDINGS Aortic arch: Three-vessel arch. Moderate calcific atherosclerosis. Partially visualized sternotomy, LIMA bypass, and saphenous grafts. Right carotid system: No evidence of dissection, stenosis (50% or greater) or occlusion. Calcified plaque of the right carotid bifurcation with mild less than 50% proximal ICA stenosis. Left carotid system: No evidence of dissection or occlusion. Calcified plaque of left carotid bifurcation with moderate to severe 70% proximal ICA stenosis. Vertebral arteries: Calcified plaque of bilateral vertebral origin stenosis with mild-to-moderate stenosis. Right dominant system. No additional segment of stenosis,  dissection, aneurysm, or occlusion. Skeleton: Mild reversal of cervical curvature and grade 1 anterolisthesis at the C5-6 level. Mild discogenic degenerative changes at C5-C7. No high-grade bony canal stenosis. Other neck: Negative. Upper chest: Negative. Review of the MIP images confirms the above findings CTA HEAD FINDINGS Anterior circulation: No significant stenosis, proximal occlusion, aneurysm, or vascular malformation. Calcified plaque of bilateral carotid siphons with mild less than 50% stenosis. Posterior circulation: No significant stenosis, proximal  occlusion, aneurysm, or vascular malformation. Venous sinuses: As permitted by contrast timing, patent. Anatomic variants: Small anterior communicating artery. No posterior communicating artery identified, likely hypoplastic or absent. Review of the MIP images confirms the above findings IMPRESSION: 1. Patent carotid and vertebral arteries. 2. Patent anterior and posterior intracranial circulation. No large vessel occlusion, aneurysm, or high-grade stenosis. 3. Stable mild less than 50% proximal right ICA stenosis with calcified plaque. 4. Stable moderate to severe 70% proximal left ICA stenosis with calcified plaque. 5. Stable mild less than 50% bilateral carotid siphon stenosis with calcified plaque. These results were called by telephone at the time of interpretation on 08/10/2017 at 12:11 am to Dr. Amie Portland , who verbally acknowledged these results. Electronically Signed   By: Kristine Garbe M.D.   On: 08/10/2017 00:13   Mr Brain Wo Contrast  Result Date: 08/10/2017 CLINICAL DATA:  82 y/o M; left-sided weakness and facial droop. History of stroke. EXAM: MRI HEAD WITHOUT CONTRAST TECHNIQUE: Multiplanar, multiecho pulse sequences of the brain and surrounding structures were obtained without intravenous contrast. COMPARISON:  08/09/2017 CT and CTA of the head. 03/21/2017 MRI head. FINDINGS: Brain: Subcentimeter focus of reduced diffusion  within the right posterior limb of internal capsule (series 5 image 62, series 6, image 22, series 7, image 53) compatible with acute/early subacute infarction. No associated hemorrhage or mass effect. Stable patchy confluent nonspecific foci of T2 FLAIR hyperintense signal abnormality in subcortical and periventricular white matter are compatible with advanced chronic microvascular ischemic changes for age. Moderate brain parenchymal volume loss. No new susceptibility hypointensity to indicate interval intracranial hemorrhage. No structural abnormality of the brain. Hippocampi are symmetric in size and signal. No extra-axial collection, hydrocephalus, or effacement of basilar cisterns. Vascular: Normal flow voids. Skull and upper cervical spine: Normal marrow signal. Sinuses/Orbits: Negative. Other: None. IMPRESSION: 1. Subcentimeter acute/early subacute infarction within the right posterior limb of internal capsule. No associated hemorrhage or mass effect. 2. Stable advanced chronic microvascular ischemic changes and moderate parenchymal volume loss of the brain. These results were called by telephone at the time of interpretation on 08/10/2017 at 12:55 am to Dr. Amie Portland , who verbally acknowledged these results. Electronically Signed   By: Kristine Garbe M.D.   On: 08/10/2017 00:56   Ct Head Code Stroke Wo Contrast  Result Date: 08/09/2017 CLINICAL DATA:  Code stroke. 82 y/o M; left-sided weakness and facial droop. EXAM: CT HEAD WITHOUT CONTRAST TECHNIQUE: Contiguous axial images were obtained from the base of the skull through the vertex without intravenous contrast. COMPARISON:  03/22/2017 CT head.  03/21/2017 MRI head. FINDINGS: Brain: No evidence of acute infarction, hemorrhage, hydrocephalus, extra-axial collection or mass lesion/mass effect. Stable chronic microvascular ischemic changes and parenchymal volume loss of the brain. Vascular: Calcific atherosclerosis of carotid siphons. No  hyperdense vessel identified. Skull: Normal. Negative for fracture or focal lesion. Sinuses/Orbits: No acute finding. Other: Bilateral intra-ocular lens replacement. ASPECTS [facility] Stroke Program Early CT Score) - Ganglionic level infarction (caudate, lentiform nuclei, internal capsule, insula, M1-M3 cortex): 7 - Supraganglionic infarction (M4-M6 cortex): 3 Total score (0-10 with 10 being normal): 10 IMPRESSION: 1. No acute intracranial abnormality identified. 2. ASPECTS is 10 3. Stable chronic microvascular ischemic changes and parenchymal volume loss of the brain. These results were communicated to Dr. Rory Percy at 11:50 pmon 5/29/2019by text page via the [facility] messaging system. Electronically Signed   By: Kristine Garbe M.D.   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 Does the need for close, 24 hr/day medical supervision in concert with the patient's rehab needs make it unreasonable for this patient to be served in a less intensive setting? Yes 2. Co-Morbidities requiring supervision/potential complications: HTN, CHF, CAD s/p CABG, dm 2  3. Due to bladder management, bowel management, safety, skin/wound care, disease management, medication administration, pain management and patient education, does the patient require 24 hr/day rehab nursing? Yes 4. Does the patient require coordinated care of a physician, rehab nurse, PT (1-2 hrs/day, 5 days/week), OT (1-2 hrs/day, 5 days/week) and SLP (1-2 hrs/day, 5 days/week) to address physical and functional deficits in the context of the above medical diagnosis(es)? Yes Addressing deficits in the following areas: balance, endurance, locomotion, strength, transferring, bowel/bladder control, bathing, dressing, feeding, grooming, toileting, speech, swallowing and psychosocial support 5. Can the patient actively participate in an intensive therapy program of at least 3 hrs of therapy per day at  least 5 days per week? Yes 6. The potential for patient to make measurable gains while on inpatient rehab is excellent 7. Anticipated functional outcomes upon discharge from inpatient rehab are supervision and min assist  with PT, supervision and min assist with OT, modified independent with SLP. 8. Estimated rehab length of stay to reach the above functional goals is: 20-28 days 9. Anticipated D/C setting: Home 10. Anticipated post D/C treatments: HH therapy and Outpatient therapy 11. Overall Rehab/Functional Prognosis: excellent  RECOMMENDATIONS: This patient's condition is appropriate for continued rehabilitative care in the following setting: CIR Patient has agreed to participate in recommended program. Yes Note that insurance prior authorization may be required for reimbursement for recommended care.  Comment: Rehab Admissions Coordinator to follow up.  Thanks,  Meredith Staggers, MD, Mellody Drown  I have personally performed a face to face diagnostic evaluation of this patient. Additionally, I have reviewed and concur with the physician assistant's documentation above.      Lavon Paganini Angiulli, PA-C 08/11/2017

## 2017-08-12 ENCOUNTER — Inpatient Hospital Stay (HOSPITAL_COMMUNITY): Payer: Medicare Other | Admitting: Physical Therapy

## 2017-08-12 ENCOUNTER — Inpatient Hospital Stay (HOSPITAL_COMMUNITY): Payer: Medicare Other | Admitting: Occupational Therapy

## 2017-08-12 DIAGNOSIS — E1169 Type 2 diabetes mellitus with other specified complication: Secondary | ICD-10-CM

## 2017-08-12 DIAGNOSIS — E669 Obesity, unspecified: Secondary | ICD-10-CM

## 2017-08-12 DIAGNOSIS — I739 Peripheral vascular disease, unspecified: Secondary | ICD-10-CM

## 2017-08-12 DIAGNOSIS — I5041 Acute combined systolic (congestive) and diastolic (congestive) heart failure: Secondary | ICD-10-CM

## 2017-08-12 DIAGNOSIS — I10 Essential (primary) hypertension: Secondary | ICD-10-CM

## 2017-08-12 LAB — GLUCOSE, CAPILLARY
GLUCOSE-CAPILLARY: 246 mg/dL — AB (ref 65–99)
GLUCOSE-CAPILLARY: 96 mg/dL (ref 65–99)
Glucose-Capillary: 186 mg/dL — ABNORMAL HIGH (ref 65–99)
Glucose-Capillary: 91 mg/dL (ref 65–99)

## 2017-08-12 NOTE — Progress Notes (Signed)
[place] PHYSICAL MEDICINE & REHABILITATION     PROGRESS NOTE  Subjective/Complaints:  Patient seen lying in bed this morning.  He states he slept well overnight.  States he is ready to begin therapies.  ROS: Denies CP, S OB, nausea, vomiting, diarrhea.  Objective: Vital Signs: Blood pressure 131/66, pulse 71, temperature 97.8 F (36.6 C), temperature source Oral, resp. rate 17, SpO2 99 %. No results found. Recent Labs    08/11/17 0454 08/11/17 1851  WBC 7.2 8.3  HGB 13.1 14.0  HCT 38.9* 42.0  PLT 223 251   Recent Labs    08/09/17 2328 08/09/17 2334 08/11/17 0454 08/11/17 1851  NA 138 140 140  --   K 4.6 4.6 4.1  --   CL 101 100* 112*  --   GLUCOSE 100* 99 127*  --   BUN 27* 30* 16  --   CREATININE 1.43* 1.30* 1.08 1.05  CALCIUM 9.7  --  9.1  --    CBG (last 3)  Recent Labs    08/12/17 0638 08/12/17 1205 08/12/17 1708  GLUCAP 186* 246* 91    Wt Readings from Last 3 Encounters:  08/10/17 84.4 kg (186 lb)  07/18/17 87.1 kg (192 lb)  07/11/17 86.6 kg (191 lb)    Physical Exam:  BP 131/66 (BP Location: Left Arm)   Pulse 71   Temp 97.8 F (36.6 C) (Oral)   Resp 17   SpO2 99%  Constitutional: He appears well-developed and well-nourished.  NAD. HENT: Normocephalic.  Atraumatic. Eyes: EOMI.  No discharge.   Cardiovascular: Normal rate and regular. No JVD. Respiratory: Effort normal.  Clear.  GI: He exhibits no distension. There is no tenderness.  Musculoskeletal: He exhibits no edema or tenderness. Neurological: He is alert and oriented  Follows full commands.  Fair awareness of deficits.  Motor: LUE 1/5 proximal to distal.  LLE 1/5 hip flexion, 0/5 distally  Skin: Skin is warm and dry.  Psychiatric: He has a normal mood and affect. His behavior is normal.   Assessment/Plan: 1. 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 Physiatrist is providing close team supervision and 24 hour management of active medical problems listed below. Physiatrist and rehab team continue to assess barriers to discharge/monitor patient progress toward functional and medical goals.  Function:  Bathing Bathing position   Position: Sitting EOB  Bathing parts Body parts bathed by patient: Left arm, Chest, Abdomen, Right upper leg, Left upper leg Body parts bathed by helper: Right arm, Front perineal area, Buttocks, Right lower leg, Back, Left lower leg  Bathing assist Assist Level: Touching or steadying assistance(Pt > 75%)      Upper Body Dressing/Undressing Upper body dressing   What is the patient wearing?: Pull over shirt/dress     Pull over shirt/dress - Perfomed by patient: Thread/unthread right sleeve Pull over shirt/dress - Perfomed by helper: Thread/unthread left sleeve, Put head through opening, Pull shirt over trunk        Upper body assist Assist Level: Touching or steadying assistance(Pt > 75%)      Lower Body Dressing/Undressing Lower body dressing   What is the patient wearing?: Pants, Non-skid slipper socks, Underwear   Underwear - Performed by helper: Thread/unthread right underwear leg, Thread/unthread left underwear leg, Pull underwear up/down   Pants- Performed by helper: Thread/unthread right pants leg, Thread/unthread left pants leg, Pull pants up/down  Non-skid slipper socks- Performed by helper: Don/doff right sock, Don/doff left sock                  Lower body assist Assist for lower body dressing: Touching or steadying assistance (Pt > 75%)      Toileting Toileting     Toileting steps completed by helper: Adjust clothing prior to toileting, Performs perineal hygiene, Adjust clothing after toileting    Toileting assist Assist level: Two helpers   Transfers Chair/bed transfer   Chair/bed transfer method: Squat pivot Chair/bed transfer assist level: Maximal assist  (Pt 25 - 49%/lift and lower) Chair/bed transfer assistive device: Armrests     Locomotion Ambulation     Max distance: 30 Assist level: 2 helpers   Wheelchair   Type: Manual Max wheelchair distance: 16ft  Assist Level: Touching or steadying assistance (Pt > 75%)  Cognition Comprehension Comprehension assist level: Understands basic 75 - 89% of the time/ requires cueing 10 - 24% of the time  Expression Expression assist level: Expresses basic 75 - 89% of the time/requires cueing 10 - 24% of the time. Needs helper to occlude trach/needs to repeat words.  Social Interaction Social Interaction assist level: Interacts appropriately 75 - 89% of the time - Needs redirection for appropriate language or to initiate interaction.  Problem Solving Problem solving assist level: Solves basic 50 - 74% of the time/requires cueing 25 - 49% of the time  Memory Memory assist level: Recognizes or recalls 50 - 74% of the time/requires cueing 25 - 49% of the time    Medical Problem List and Plan:  1. 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 Aspirin and Plavix x3 weeks then aspirin alone   Begin CIR   Notes reviewed, images reviewed, labs reviewed 2. DVT Prophylaxis/Anticoagulation: Subcutaneous Lovenox  3. Pain Management: Tylenol as needed  4. Mood: Provide emotional support  5. Neuropsych: This patient is capable of making decisions on his own behalf.  6. Skin/Wound Care: Routine skin checks  7. Fluids/Electrolytes/Nutrition: Routine in and outs   BMP within acceptable range on 5/31, labs pending 8. CAD with CABG. Continue aspirin and Plavix. No chest pain or shortness of breath  9. Diabetes mellitus. Hemoglobin A1c 6.0. SSI. Check blood sugars before meals and at bedtime. Glucophage 1000 mg twice daily, Amaryl 4 mg daily, Lantus insulin 10 units nightly, Tradjenta 5 mg daily   Monitor with increased mobility 10.  Dysphagia. Dysphasia #2 nectar liquids. Follow-up speech therapy  11. Hypertension. Cozaar 25 mg daily, Toprol 50 mg daily   Monitor with increased mobility 12. Diastolic congestive heart failure. Monitor for any signs of fluid overload. Patient was taking Lasix 40 mg daily if needed for fluid retention prior to admission.    Daily weights ordered There were no vitals filed for this visit. 13. History of seizure disorder. Depakote 500 mg daily.  14. Hyperlipidemia. Lipitor  15. History of alcohol use. Monitor for withdrawal   LOS (Days) 1 A FACE TO FACE EVALUATION WAS PERFORMED  Zechariah Bissonnette Lorie Phenix 08/12/2017 6:25 PM

## 2017-08-12 NOTE — Evaluation (Signed)
Occupational Therapy Assessment and Plan  Patient Details  Name: Fred [name] MRN: [id number] Date of Birth: [birth year]  OT Diagnosis: abnormal posture, hemiplegia affecting non-dominant side and muscle weakness (generalized) Rehab Potential: Rehab Potential (ACUTE ONLY): Good ELOS: 21-24 days    Today's Date: 08/12/2017 OT Individual Time: 5701-7793 OT Individual Time Calculation (min): 60 min     Problem List:  Patient Active Problem List   Diagnosis Date Noted  . Small vessel disease ([place]) 08/11/2017  . Left hemiparesis ([place])   . Benign essential hypertension with delivery   . CVA (cerebral vascular accident) ([place]) 08/10/2017  . 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 Primary osteoarthritis of both knees 05/11/2017  . Stroke (cerebrum) (Fred [name]) 03/21/2017  . Type 2 diabetes mellitus with complication, with long-term current use of insulin ([place]) 03/27/2015  . Atherosclerosis of native coronary artery of native heart without angina pectoris 03/27/2015  . Palpitations 11/04/2014  . S/P CABG (coronary artery bypass graft) 11/04/2014  . Atrial fibrillation, unspecified   . History of stroke 09/04/2014  . Obesity (BMI 30-39.9) 11/29/2013  . History of gout 05/16/2013  . Congestive heart failure ([place]) 04/20/2009  . DYSPNEA 02/11/2009  . CARDIOVASCULAR FUNCTION STUDY, ABNORMAL 01/29/2009  . NECK PAIN 08/03/2007  . ANEMIA / OTHER 05/16/2007  . Coronary atherosclerosis 05/16/2007  . PAROXYSMAL ATRIAL FIBRILLATION 05/16/2007  . 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 Hyperlipidemia 03/30/2007  . DEGENERATIVE JOINT DISEASE 03/30/2007  . PAROTID LESION, UNSPECIFIED 09/29/2006  . Partial epilepsy with impairment of consciousness ([place]) 09/29/2006  . Diabetic polyneuropathy (Fred [name]) 09/29/2006  . Essential hypertension 09/29/2006  . PERIPHERAL VASCULAR DISEASE  09/29/2006  . DIVERTICULOSIS 09/29/2006    Past Medical History:  Past Medical History:  Diagnosis Date  . Atrial fibrillation (Fred [name])    Post operative  . CAD (coronary artery disease)    s/p cabg  . CHF (congestive heart failure) (Fred [name])   . Diverticulosis   . DJD (degenerative joint disease)   . DM (diabetes mellitus) (Fred [name])   . History of anemia   . History of stroke   . Hyperlipidemia   . Hypertension   . 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 Peripheral vascular disease ([place])   . Polyneuropathy, diabetic (Fred [name])   . Seizure disorder, complex partial (Fred [name])   . Syncope and collapse    Past Surgical History:  Past Surgical History:  Procedure Laterality Date  . CATARACT EXTRACTION W/ INTRAOCULAR LENS  IMPLANT, BILATERAL  06/2013   Dr. [name]  . CORONARY ARTERY BYPASS GRAFT  02/13/2004   4 vesel  . removal of sebaceous cyst from neck  08/2007   Dr. [name]  . TONSILLECTOMY      Assessment & Plan Clinical Impression: Patient is a 82 y.o. year old male  with history of alcohol use, hypertension, seizure maintained on Depakote as well as history of CVA with little residual January 2019 and did receive TPA. CAD with CABG maintained on aspirin, transient atrial fibrillation, diastolic congestive heart failure. Per chart review patient lives with spouse. Independent and active. Two-level home with bedroom on Main level and 2 steps to entry. Excellent local family support. Presented 08/09/2017 with left-sided weakness and facial droop as well as slurred speech. Cranial CT scan negative. CT angiogram of head and neck showed mild less than 50% proximal right ICA stenosis and moderate to severe 70% proximal left ICA stenosis. MRI  showed subcentimeter acute versus early subacute infarction within the right posterior limb of internal capsule. Echocardiogram with ejection fraction of 41% grade 1 diastolic dysfunction. Currently maintained on aspirin and Plavix for CVA  prophylaxis x3 weeks then aspirin alone. Subcutaneous Lovenox for DVT prophylaxis. Dysphasia #2 nectar thick liquid diet. Physical and occupational therapy evaluations completed with recommendations of physical medicine rehab consult.    Patient transferred to CIR on 08/11/2017 .    Patient currently requires max with basic self-care skills secondary to muscle weakness, abnormal tone, decreased coordination and decreased motor planning and decreased sitting balance, decreased standing balance, decreased postural control, hemiplegia and decreased balance strategies.  Prior to hospitalization, patient could complete ADLs with independent .  Patient will benefit from skilled intervention to decrease level of assist with basic self-care skills prior to discharge home with care partner.  Anticipate patient will require 24 hour supervision and follow up home health.  OT - End of Session Activity Tolerance: Tolerates 30+ min activity with multiple rests Endurance Deficit: Yes OT Assessment Rehab Potential (ACUTE ONLY): Good OT Barriers to Discharge: Inaccessible home environment OT Barriers to Discharge Comments: decreased IND with ADLs  OT Patient demonstrates impairments in the following area(s): Balance;Endurance;Safety;Motor;Sensory OT Basic ADL's Functional Problem(s): Eating;Grooming;Bathing;Dressing;Toileting OT Transfers Functional Problem(s): Toilet;Tub/Shower OT Plan OT Intensity: Minimum of 1-2 x/day, 45 to 90 minutes OT Frequency: 5 out of 7 days OT Duration/Estimated Length of Stay: 21-24 days  OT Treatment/Interventions: [employer] reintegration;Neuromuscular re-education;Self Care/advanced ADL retraining;Patient/family education;Therapeutic Exercise;UE/LE Coordination activities;Discharge planning;DME/adaptive equipment instruction;Functional mobility training;Therapeutic Activities;UE/LE Strength taining/ROM OT Self Feeding Anticipated Outcome(s): S OT Basic Self-Care Anticipated  Outcome(s): min A OT Toileting Anticipated Outcome(s): min A  OT Bathroom Transfers Anticipated Outcome(s): min A  OT Recommendation Patient destination: Home Follow Up Recommendations: Home health OT Equipment Recommended: 3 in 1 bedside comode Equipment Details: RW, TBD   Skilled Therapeutic Intervention Chart review and initial IR evaluation completed with pt. Therapist and pt est. Goals to go home with min A with family available for 24 hour care along with wife. Treatment session focused on ADLs/self care training, transfer training, pt education, NMR, activity/endurance training, and safety awareness. Upon entering pt supine in bed finishing breakfast with granddaughter present. Pt alert and oriented x 4. Agreeable to OT session for d/b. Noted no mvt to L UE with reflex present in L LE. Pt moved from supine lying to EOB sitting with mod A with v/c for hand/foot placement using bed rails. Pt noted to have significant weakness to trunk with L sided lean but able to tolerate upright static sitting for 3 minutes with CGA and v/c.Completed EOB bathing with mod A for UB washing d/t fatigue and poor unsupported sitting balance. Hand over hand to facility L UE engagement in washing. Pt trained with hemi-plegic dressing for T-shirt and required mod A to don shirt with continual cuing. Pt required max A for lb d/b. Utilized STEDY for sit<>stands 4 x with mod A. Encouraged upright posture while standing in STEDY, therapist provided support to L side for hip and trunk alignment. Pt transferred into w/c with STEDY and instructed on "scoot" back with shifting hips back 1 at a time in chair. Pt repositioned in chair with mod A. Arm tray applied and therapist educated pt on purpose of arm tray for UE support. Rainbow [name] daughter present throughout session. Pt left resting up in w/c with arm tryay and leg rests applied with call bell within reach and family present. No c/o pain.  OT  Evaluation Precautions/Restrictions  Precautions Precautions: Fall Precaution Comments: L sided weakness Restrictions Weight Bearing Restrictions: No General Additional Pertinent History: 82 year old right-handed male with history of alcohol use, hypertension, seizure maintained on Depakote as well as history of CVA with little residual January 2019 and did receive TPA. CAD with CABG maintained on aspirin, transient atrial fibrillation, diastolic congestive heart failure. Per chart review patient lives with spouse. Independent and active. Two-level home with bedroom on Main level and 2 steps to entry. Excellent local family support. Presented 08/09/2017 with left-sided weakness and facial droop as well as slurred speech.  Response to Previous Treatment: Patient reporting fatigue but able to participate Family/Caregiver Present: Yes Vital Signs Therapy Vitals Temp: 97.8 F (36.6 C) Temp Source: Oral Pulse Rate: 71 BP: 131/66 Patient Position (if appropriate): Sitting Oxygen Therapy SpO2: 99 % O2 Device: Room Air Pain Pain Assessment Pain Score: 0-No pain Home Living/Prior Functioning Home Living Family/patient expects to be discharged to:: Private residence Living Arrangements: Spouse/significant other, Children, Other relatives Available Help at Discharge: Family, Available 24 hours/day Type of Home: House Home Access: Stairs to enter [employer] of Steps: 2 Entrance Stairs-Rails: None Home Layout: Two level, Able to live on main level with bedroom/bathroom Alternate Level Stairs-Number of Steps: has lift to go 2nd story.  Bathroom Shower/Tub: Public librarian, Architectural technologist: Standard  Lives With: Spouse IADL History Homemaking Responsibilities: Yes Occupation: Retired Type of Occupation: retired from [employer] and Hobbies: very active PTA, out chopping wood, very close with family  Prior Function Level of Independence: Independent with basic  ADLs  Able to Take Stairs?: Yes Driving: Yes Vocation: Retired Comments: Was still very active at home, performing ADLs, IADLs, chopping wood, etc. Granddaughter reports she does all the Laundry  ADL ADL ADL Comments: see functional navigator Vision Baseline Vision/History: Wears glasses Wears Glasses: At all times Patient Visual Report: No change from baseline Vision Assessment?: No apparent visual deficits Eye Alignment: Within Functional Limits Ocular Range of Motion: Within Functional Limits Alignment/Gaze Preference: Within Defined Limits Tracking/Visual Pursuits: Able to track stimulus in all quads without difficulty;Other (comment) Additional Comments: delayed lateral tracking to L  Perception  Perception: Within Functional Limits Praxis Praxis: Intact Cognition Overall Cognitive Status: Within Functional Limits for tasks assessed Arousal/Alertness: Awake/alert Orientation Level: Person;Place;Situation Person: Oriented Place: Oriented Situation: Oriented Year: 2019 Month: June Day of Week: Correct Memory: Appears intact Immediate Memory Recall: Blue;Bed;Sock Awareness: Appears intact Problem Solving: Appears intact Executive Function: Reasoning Reasoning: Appears intact Safety/Judgment: Appears intact Sensation Sensation Light Touch: Impaired Detail Light Touch Impaired Details: Impaired RLE;Impaired LLE Proprioception: Impaired Detail Proprioception Impaired Details: Impaired LUE;Impaired LLE Additional Comments: LE >UE Coordination Gross Motor Movements are Fluid and Coordinated: No Fine Motor Movements are Fluid and Coordinated: No Coordination and Movement Description: dense hemiplegia on the L UE and LE Finger Nose Finger Test: flaccid LUE  Motor  Motor Motor: Hemiplegia;Abnormal tone Motor - Skilled Clinical Observations: dense left hemiplegia with low tone in the UE and the LE  Mobility  Bed Mobility Bed Mobility: Rolling Right;Rolling  Left;Supine to Sit;Sit to Supine Rolling Right: 3: Mod assist Rolling Right Details: Verbal cues for precautions/safety;Verbal cues for technique;Verbal cues for safe use of DME/AE;Verbal cues for sequencing;Manual facilitation for placement Rolling Left: 3: Mod assist Rolling Left Details: Verbal cues for precautions/safety;Verbal cues for sequencing;Verbal cues for technique;Verbal cues for safe use of DME/AE;Manual facilitation for placement Supine to Sit: 3: Mod assist Supine to Sit Details: Verbal cues for safe  use of DME/AE;Verbal cues for sequencing;Verbal cues for precautions/safety;Verbal cues for technique;Visual cues/gestures for sequencing;Manual facilitation for placement Sit to Supine: 3: Mod assist Sit to Supine - Details: Visual cues/gestures for sequencing;Verbal cues for sequencing;Verbal cues for precautions/safety;Verbal cues for technique;Verbal cues for safe use of DME/AE;Manual facilitation for placement Transfers Transfers: Sit to Stand Sit to Stand: 2: Max assist Sit to Stand Details: Verbal cues for precautions/safety;Verbal cues for technique;Visual cues/gestures for sequencing;Verbal cues for safe use of DME/AE;Manual facilitation for weight shifting;Manual facilitation for placement  Trunk/Postural Assessment  Cervical Assessment Cervical Assessment: Exceptions to WFL(right rotation and laterally flexed) Thoracic Assessment Thoracic Assessment: Exceptions to [facility] Lumbar Assessment Lumbar Assessment: Exceptions to [facility] Postural Control Postural Control: Deficits on evaluation(poor righting reactions to L side, L sided lateral lean/weakness)  Balance Balance Balance Assessed: Yes Static Sitting Balance Static Sitting - Level of Assistance: 5: Stand by assistance Dynamic Sitting Balance Dynamic Sitting - Level of Assistance: 4: Min assist Sitting balance - Comments: L lateral lean in sitting. required R UE support with bed rail an v/c for upright sitting with min  to mod A  Static Standing Balance Static Standing - Level of Assistance: 2: Max assist Dynamic Standing Balance Dynamic Standing - Level of Assistance: 2: Max assist;1: +1 Total assist Extremity/Trunk Assessment RUE Assessment RUE Assessment: Within Functional Limits LUE Assessment LUE Assessment: Exceptions to WFL LUE Tone LUE Tone: Flaccid(dense hemi to L with no muscle activation)   See Function Navigator for Current Functional Status.   Refer to Care Plan for Long Term Goals  Recommendations for other services: None    Discharge Criteria: Patient will be discharged from OT if patient refuses treatment 3 consecutive times without medical reason, if treatment goals not met, if there is a change in medical status, if patient makes no progress towards goals or if patient is discharged from hospital.  The above assessment, treatment plan, treatment alternatives and goals were discussed and mutually agreed upon: by patient  Delon [place] 08/12/2017, 3:40 PM

## 2017-08-12 NOTE — Evaluation (Addendum)
Physical Therapy Assessment and Plan  Patient Details  Name: Fred [name] MRN: [id number] Date of Birth: 1936/02/13  PT Diagnosis: Abnormal posture, Abnormality of gait, Hemiplegia non-dominant and Muscle weakness Rehab Potential: Good ELOS: 21-24days    Today's Date: 08/12/2017 PT Individual Time: 7673-4193 AND 1600-1700 PT Individual Time Calculation (min): 75 min  AND 60 min   Problem List:  Patient Active Problem List   Diagnosis Date Noted  . Small vessel disease ([place]) 08/11/2017  . Left hemiparesis ([place])   . Benign essential hypertension with delivery   . CVA (cerebral vascular accident) ([place]) 08/10/2017  . 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 Primary osteoarthritis of both knees 05/11/2017  . Stroke (cerebrum) ([place]) 03/21/2017  . Type 2 diabetes mellitus with complication, with long-term current use of insulin ([place]) 03/27/2015  . Atherosclerosis of native coronary artery of native heart without angina pectoris 03/27/2015  . Palpitations 11/04/2014  . S/P CABG (coronary artery bypass graft) 11/04/2014  . Atrial fibrillation, unspecified   . History of stroke 09/04/2014  . Obesity (BMI 30-39.9) 11/29/2013  . History of gout 05/16/2013  . Congestive heart failure ([place]) 04/20/2009  . DYSPNEA 02/11/2009  . CARDIOVASCULAR FUNCTION STUDY, ABNORMAL 01/29/2009  . NECK PAIN 08/03/2007  . ANEMIA / OTHER 05/16/2007  . Coronary atherosclerosis 05/16/2007  . PAROXYSMAL ATRIAL FIBRILLATION 05/16/2007  . Subcortical infarction ([place]) 05/16/2007  . Hyperlipidemia 03/30/2007  . DEGENERATIVE JOINT DISEASE 03/30/2007  . PAROTID LESION, UNSPECIFIED 09/29/2006  . Partial epilepsy with impairment of consciousness ([place]) 09/29/2006  . Diabetic polyneuropathy ([place]) 09/29/2006  . Essential hypertension 09/29/2006  . PERIPHERAL VASCULAR DISEASE 09/29/2006  .  DIVERTICULOSIS 09/29/2006    Past Medical History:  Past Medical History:  Diagnosis Date  . Atrial fibrillation ([place])    Post operative  . CAD (coronary artery disease)    s/p cabg  . CHF (congestive heart failure) ([employer])   . Diverticulosis   . DJD (degenerative joint disease)   . DM (diabetes mellitus) ([place])   . History of anemia   . History of stroke   . Hyperlipidemia   . Hypertension   . 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 Peripheral vascular disease ([place])   . Polyneuropathy, diabetic ([place])   . Seizure disorder, complex partial ([place])   . Syncope and collapse    Past Surgical History:  Past Surgical History:  Procedure Laterality Date  . CATARACT EXTRACTION W/ INTRAOCULAR LENS  IMPLANT, BILATERAL  06/2013   Dr. [name]  . CORONARY ARTERY BYPASS GRAFT  02/13/2004   4 vesel  . removal of sebaceous cyst from neck  08/2007   Dr. [name]  . TONSILLECTOMY      Assessment & Plan Clinical Impression: Patient is a 82 year old right-handed male with history of alcohol use, hypertension, seizure maintained on Depakote as well as history of CVA with little residual January 2019 and did receive TPA. CAD with CABG maintained on aspirin, transient atrial fibrillation, diastolic congestive heart failure. Per chart review patient lives with spouse. Independent and active. Two-level home with bedroom on Main level and 2 steps to entry. Excellent local family support. Presented 08/09/2017 with left-sided weakness and facial droop as well as slurred speech. Cranial CT scan negative. CT angiogram of head and neck showed mild less than 50% proximal right ICA stenosis and moderate to severe 70% proximal left ICA stenosis. MRI showed subcentimeter acute versus  early subacute infarction within the right posterior limb of internal capsule. Echocardiogram with ejection fraction of 41% grade 1 diastolic dysfunction. Currently maintained on aspirin and Plavix for CVA prophylaxis x3  weeks then aspirin alone. Subcutaneous Lovenox for DVT prophylaxis. Dysphasia #2 nectar thick liquid diet. Physical and occupational therapy evaluations completed with recommendations of physical medicine rehab consult.     Patient transferred to CIR on 08/11/2017 .   Patient currently requires max with mobility secondary to muscle weakness and muscle paralysis, impaired timing and sequencing, abnormal tone and unbalanced muscle activation, decreased attention to left and decreased sitting balance, decreased standing balance, decreased postural control, hemiplegia and decreased balance strategies.  Prior to hospitalization, patient was independent  with mobility and lived with Spouse in a House home.  Home access is 2Stairs to enter.  Patient will benefit from skilled PT intervention to maximize safe functional mobility, minimize fall risk and decrease caregiver burden for planned discharge home with 24 hour assist.  Anticipate patient will benefit from follow up [facility] at discharge.  PT - End of Session Activity Tolerance: Tolerates 10 - 20 min activity with multiple rests Endurance Deficit: Yes PT Assessment Rehab Potential (ACUTE/IP ONLY): Good PT Barriers to Discharge: Inaccessible home environment;Home environment access/layout PT Patient demonstrates impairments in the following area(s): Balance;Edema;Endurance;Motor;Nutrition;Sensory;Perception;Safety PT Transfers Functional Problem(s): Bed Mobility;Bed to Chair;Car;Furniture;Floor PT Locomotion Functional Problem(s): Ambulation;Stairs;Wheelchair Mobility PT Plan PT Intensity: Minimum of 1-2 x/day ,45 to 90 minutes PT Frequency: 5 out of 7 days PT Duration Estimated Length of Stay: 21-24days  PT Treatment/Interventions: Community reintegration;Ambulation/gait training;Balance/vestibular training;Cognitive remediation/compensation;Discharge planning;Disease management/prevention;DME/adaptive equipment instruction;Functional electrical  stimulation;Functional mobility training;Patient/family education;Pain management;Neuromuscular re-education;Psychosocial support;Skin care/wound management;Splinting/orthotics;Therapeutic Exercise;Therapeutic Activities;Stair training;UE/LE Strength taining/ROM;UE/LE Coordination activities;Visual/perceptual remediation/compensation;Wheelchair propulsion/positioning PT Transfers Anticipated Outcome(s): Min assist with LRAD  PT Locomotion Anticipated Outcome(s): Supervision assist WC  PT Recommendation Recommendations for Other Services: Speech consult;Neuropsych consult;Therapeutic Recreation consult Therapeutic Recreation Interventions: Stress management;Kitchen group Follow Up Recommendations: Home health PT Patient destination: Home Equipment Recommended: Rolling walker with 5" wheels;Wheelchair (measurements);Wheelchair cushion (measurements)  Skilled Therapeutic Intervention Pt received sitting in WC and agreeable to PT. PT instructed patient in PT Evaluation and initiated treatment intervention; see below for results. PT educated patient in [place], rehab potential, rehab goals, and discharge recommendations. Patient returned to room and left sitting in [facility] with call bell in reach and all needs met.    Session 2  Pt received supine in bed and agreeable to PT. Supine>sit transfer with mod assist through log roll and moderate cues. Squat pivot transfers completed to the R and the L throughout treatment with max assist and max cues for set up and sequencing. Nustep reciprocal movement training 3 min x 2 with supervision-min assist to activate the LLE. Sit<>stand at rail in hallx 3 with max assist and standing balance with mirror feedback. 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 Pt returned to room and performed squat pivot transfer to bed with max assist. Sit>supine completed with mod assist to control the LLE and the LUE. Pt left supine in bed with call bell in reach and all needs met.       PT Evaluation Precautions/Restrictions Precautions Precautions: Fall Precaution Comments: L sided weakness Restrictions Weight Bearing Restrictions: No General   Vital SignsTherapy Vitals Temp: 97.8 F (36.6 C) Temp Source: Oral Pulse Rate: 71 BP: 131/66 Patient Position (if appropriate): Sitting Oxygen Therapy SpO2: 99 % O2 Device: Room Air Pain Pain Assessment Pain Score: 0-No pain Home Living/Prior Functioning Home Living  Available Help at Discharge: Family;Available 24 hours/day Type of Home: House Home Access: Stairs to enter [employer] of Steps: 2 Entrance Stairs-Rails: None Home Layout: Two level;Able to live on main level with bedroom/bathroom Alternate Level Stairs-Number of Steps: has lift to go 2nd story.  Bathroom Shower/Tub: Product/process development scientist: Standard  Lives With: Spouse Prior Function Level of Independence: Independent with basic ADLs  Able to Take Stairs?: Yes Driving: Yes Vocation: Retired Comments: Was still very active at home, performing ADLs, IADLs, chopping wood, etc. Granddaughter reports she does all the [employer]  Vision/Perception  Vision - Assessment Eye Alignment: Within Functional Limits Ocular Range of Motion: Within Functional Limits Alignment/Gaze Preference: Within Defined Limits Tracking/Visual Pursuits: Able to track stimulus in all quads without difficulty;Other (comment) Additional Comments: delayed lateral tracking to L  Perception Perception: Within Functional Limits Praxis Praxis: Intact  Cognition Overall Cognitive Status: Within Functional Limits for tasks assessed Arousal/Alertness: Awake/alert Orientation Level: Oriented X4 Memory: Appears intact Awareness: Appears intact Problem Solving: Appears intact Executive Function: Reasoning Reasoning: Appears intact Safety/Judgment: Appears intact Sensation Sensation Light Touch: Impaired Detail Light Touch Impaired Details:  Impaired RLE;Impaired LLE Proprioception: Impaired Detail Proprioception Impaired Details: Impaired LUE;Impaired LLE Additional Comments: LE >UE Coordination Gross Motor Movements are Fluid and Coordinated: No Fine Motor Movements are Fluid and Coordinated: No Coordination and Movement Description: dense hemiplegia on the L UE and LE Finger Nose Finger Test: flaccid LUE  Motor  Motor Motor: Hemiplegia;Abnormal tone Motor - Skilled Clinical Observations: dense left hemiplegia with low tone in the UE and the LE   Mobility Bed Mobility Bed Mobility: Rolling Right;Rolling Left;Supine to Sit;Sit to Supine Rolling Right: 3: Mod assist Rolling Right Details: Verbal cues for precautions/safety;Verbal cues for technique;Verbal cues for safe use of DME/AE;Verbal cues for sequencing;Manual facilitation for placement Rolling Left: 3: Mod assist Rolling Left Details: Verbal cues for precautions/safety;Verbal cues for sequencing;Verbal cues for technique;Verbal cues for safe use of DME/AE;Manual facilitation for placement Supine to Sit: 3: Mod assist Supine to Sit Details: Verbal cues for safe use of DME/AE;Verbal cues for sequencing;Verbal cues for precautions/safety;Verbal cues for technique;Visual cues/gestures for sequencing;Manual facilitation for placement Sit to Supine: 3: Mod assist Sit to Supine - Details: Visual cues/gestures for sequencing;Verbal cues for sequencing;Verbal cues for precautions/safety;Verbal cues for technique;Verbal cues for safe use of DME/AE;Manual facilitation for placement Transfers Transfers: Yes Sit to Stand: 2: Max assist Sit to Stand Details: Verbal cues for precautions/safety;Verbal cues for technique;Visual cues/gestures for sequencing;Verbal cues for safe use of DME/AE;Manual facilitation for weight shifting;Manual facilitation for placement Squat Pivot Transfers: 2: Max assist Squat Pivot Transfer Details: Verbal cues for precautions/safety;Verbal cues for  technique;Manual facilitation for placement;Manual facilitation for weight shifting;Verbal cues for safe use of DME/AE;Verbal cues for sequencing;Tactile cues for posture Squat Pivot Transfer Details (indicate cue type and reason): car transfer with max assist and squat pivot technique.  Locomotion  Ambulation Ambulation: Yes Ambulation/Gait Assistance: 1: +2 Total assist Ambulation Distance (Feet): 30 Feet Assistive device: Parallel bars Gait Gait: Yes Gait Pattern: Impaired Gait Pattern: Step-to pattern;Left foot flat;Wide base of support;Abducted - left Stairs / Additional Locomotion Stairs: No Architect: Yes Wheelchair Assistance: 4: Advertising account executive Details: Verbal cues for sequencing;Verbal cues for technique;Visual cues/gestures for Information systems manager: Right upper extremity;Right lower extremity Wheelchair Parts Management: Needs assistance Distance: 135f  Trunk/Postural Assessment  Cervical Assessment Cervical Assessment: Exceptions to WFL(Right rotated and laterally flexed ) Thoracic Assessment Thoracic Assessment: Exceptions to [facility]  Lumbar Assessment: Exceptions to [facility] Postural Control Postural Control: Deficits on evaluation(poor righting reactions on the L)  Balance Balance Balance Assessed: Yes Static Sitting Balance Static Sitting - Level of Assistance: 5: Stand by assistance Dynamic Sitting Balance Dynamic Sitting - Level of Assistance: 4: Min assist Static Standing Balance Static Standing - Level of Assistance: 2: Max assist Dynamic Standing Balance Dynamic Standing - Level of Assistance: 2: Max assist;1: +1 Total assist Extremity Assessment      RLE Assessment RLE Assessment: Within Functional Limits LLE Assessment LLE Assessment: Exceptions to [facility] LLE Strength LLE Overall Strength Comments: 2-/5 hip/knee extension with gravity eliminated.    See Function  Navigator for Current Functional Status.   Refer to Care Plan for Long Term Goals  Recommendations for other services: Neuropsych and Therapeutic Recreation  Kitchen group and Stress management  Discharge Criteria: Patient will be discharged from PT if patient refuses treatment 3 consecutive times without medical reason, if treatment goals not met, if there is a change in medical status, if patient makes no progress towards goals or if patient is discharged from hospital.  The above assessment, treatment plan, treatment alternatives and goals were discussed and mutually agreed upon: by patient  Lorie Phenix 08/12/2017, 2:48 PM

## 2017-08-12 NOTE — Evaluation (Signed)
Speech Language Pathology Assessment and Plan  Patient Details  Name: Fred [name] MRN: [id number] Date of Birth: 1935/05/09  SLP Diagnosis: Cognitive Impairments;Dysphagia  Rehab Potential: Good ELOS: 20-28 days     Today's Date: 08/12/2017 SLP Individual Time: 2992-4268 SLP Individual Time Calculation (min): 60 min   Problem List:  Patient Active Problem List   Diagnosis Date Noted  . Small vessel disease ([place]) 08/11/2017  . Left hemiparesis ([place])   . Benign essential hypertension with delivery   . CVA (cerebral vascular accident) ([place]) 08/10/2017  . 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 of localized onset, not intractable, without status epilepticus ([place]) 06/20/2017  . Primary osteoarthritis of both knees 05/11/2017  . Stroke (cerebrum) ([place]) 03/21/2017  . Type 2 diabetes mellitus with complication, with long-term current use of insulin ([place]) 03/27/2015  . Atherosclerosis of native coronary artery of native heart without angina pectoris 03/27/2015  . Palpitations 11/04/2014  . S/P CABG (coronary artery bypass graft) 11/04/2014  . Atrial fibrillation, unspecified   . History of stroke 09/04/2014  . Obesity (BMI 30-39.9) 11/29/2013  . History of gout 05/16/2013  . Congestive heart failure ([place]) 04/20/2009  . DYSPNEA 02/11/2009  . CARDIOVASCULAR FUNCTION STUDY, ABNORMAL 01/29/2009  . NECK PAIN 08/03/2007  . ANEMIA / OTHER 05/16/2007  . Coronary atherosclerosis 05/16/2007  . PAROXYSMAL ATRIAL FIBRILLATION 05/16/2007  . Subcortical infarction ([place]) 05/16/2007  . Hyperlipidemia 03/30/2007  . DEGENERATIVE JOINT DISEASE 03/30/2007  . PAROTID LESION, UNSPECIFIED 09/29/2006  . Partial epilepsy with impairment of consciousness ([place]) 09/29/2006  . Diabetic polyneuropathy ([place]) 09/29/2006  . Essential hypertension 09/29/2006  . PERIPHERAL VASCULAR DISEASE 09/29/2006  . DIVERTICULOSIS 09/29/2006   Past Medical History:  Past Medical  History:  Diagnosis Date  . Atrial fibrillation ([place])    Post operative  . CAD (coronary artery disease)    s/p cabg  . CHF (congestive heart failure) ([place])   . Diverticulosis   . DJD (degenerative joint disease)   . DM (diabetes mellitus) ([place])   . History of anemia   . History of stroke   . Hyperlipidemia   . Hypertension   . 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 Peripheral vascular disease ([place])   . Polyneuropathy, diabetic ([place])   . Seizure disorder, complex partial ([place])   . Syncope and collapse    Past Surgical History:  Past Surgical History:  Procedure Laterality Date  . CATARACT EXTRACTION W/ INTRAOCULAR LENS  IMPLANT, BILATERAL  06/2013   Dr. [name]  . CORONARY ARTERY BYPASS GRAFT  02/13/2004   4 vesel  . removal of sebaceous cyst from neck  08/2007   Dr. [name]  . TONSILLECTOMY      Assessment / Plan / Recommendation Clinical Impression An82 y.o.right-handed malewith history of hypertension, seizure with history of CVA with little residualJanuary 2019 and received TPA, CAD with CABG maintained on aspirin, atrial fibrillation, diastolic congestive heart failure. Per chart review patient lives with spouse. Independent still active. Two-level home with bedroom on Main and 2 steps to entry. Presented 08/09/2017 with left-sided weakness facial droop and slurred speech. Cranial CT scan negative. CT angiogram of head and neck showed mild less than 50% proximal right ICA stenosis and moderate to severe 70% proximal left ICA stenosis. MRI showed subcentimeter acute versus early subacute infarction within the right posterior limb of internal capsule. Echocardiogram with ejection fraction of 34% grade 1 diastolic dysfunction. Currently on aspirin and Plavix for CVA prophylaxis. Subcutaneous Lovenox  for DVT prophylaxis. Await plan for formal swallow study. Physical therapy evaluation completed with recommendations of physical medicine rehab  consult.  Pt presents with severe impairments in high level cognitive skills, impacting semi-complex problem solving, error awareness and moderate impairment in recall, which was further supported by subsections of ALFA on medication management and delayed recall utilizing subsection of [place]. Family present, granddaughter and grandson, believe pt to be at cognitive baseline, however upon observation of assessment agreed to noted impairments.Pt manages medication and his wife, whom he lives with manages fiances. Pt presents with mild-moderate impairment in swallow function, with immediate/delayed cough noted during 50% of thin liquid trials via tsp/cup/straw and left buccal pocketing with reduced sensation resulting in immediate cough on regular textured food trials. Pt demonstrated oral control and no s/s aspiration on ice chips, NTL and dys 2 textured foods. SLP recommends continuing dys 2 and NTL diet and ordering instrumental swallow study for further assessment of thin liquids. Pt would benefit from skilled ST services in order to maximize functional independence and reduce burden of care prior to discharge.    Skilled Therapeutic Interventions          Skilled ST services focused on cognitive skills. SLP facilitated recall of novel information providing mod A verbal cues. Pt states utilizing external aid for all appointments and reminders. SLP reviwed diet recommendations and plan for MBS, family and pt agreed.  Pt was left in room with call bell within reach. Reccomend to continue skilled ST services.    SLP Assessment  Patient will need skilled Speech Lanaguage Pathology Services during CIR admission    Recommendations  SLP Diet Recommendations: Dysphagia 2 (Fine chop);Nectar Liquid Administration via: Cup Medication Administration: Whole meds with puree Supervision: Patient able to self feed;Intermittent supervision to cue for compensatory strategies Compensations: Slow rate;Small  sips/bites;Lingual sweep for clearance of pocketing;Minimize environmental distractions Postural Changes and/or Swallow Maneuvers: Seated upright 90 degrees Oral Care Recommendations: Oral care BID Patient destination: Home Follow up Recommendations: 24 hour supervision/assistance;Home Health SLP;Outpatient SLP(possibly ) Equipment Recommended: None recommended by SLP    SLP Frequency 3 to 5 out of 7 days   SLP Duration  SLP Intensity  SLP Treatment/Interventions 20-28 days   Minumum of 1-2 x/day, 30 to 90 minutes  Cueing hierarchy;Dysphagia/aspiration precaution training;Cognitive remediation/compensation;Functional tasks;Medication managment    Pain Pain Assessment Pain Scale: 0-10 Pain Score: 0-No pain  Prior Functioning Cognitive/Linguistic Baseline: Within functional limits Type of Home: House  Lives With: Spouse Available Help at Discharge: Family;Available 24 hours/day Vocation: Retired  Function:  Eating Eating   Modified Consistency Diet: Yes Eating Assist Level: Supervision or verbal cues;Helper checks for pocketed food;Set up assist for           Cognition Comprehension Comprehension assist level: Understands basic 75 - 89% of the time/ requires cueing 10 - 24% of the time  Expression   Expression assist level: Expresses basic 75 - 89% of the time/requires cueing 10 - 24% of the time. Needs helper to occlude trach/needs to repeat words.  Social Interaction Social Interaction assist level: Interacts appropriately 75 - 89% of the time - Needs redirection for appropriate language or to initiate interaction.  Problem Solving Problem solving assist level: Solves basic 50 - 74% of the time/requires cueing 25 - 49% of the time  Memory Memory assist level: Recognizes or recalls 50 - 74% of the time/requires cueing 25 - 49% of the time   Short Term Goals: Week 1: SLP Short Term Goal  1 (Week 1): Pt will demonstrate efficient mastication and oral clearance of dys 3  textured trials with min overt s/s aspiration to demonstrate readiness for solid diet advancement, SLP Short Term Goal 2 (Week 1): Pt will consume thin liquid trials with mod-min overt s/s aspiration in order to assess readiness for instrumental swallow assessment.  SLP Short Term Goal 3 (Week 1): Pt will participate in instrumental swallow assessment in order to determine least restrictive diet.  SLP Short Term Goal 4 (Week 1): Pt will utilize external memory aids to recall new, daily information with min A verbal cues.  SLP Short Term Goal 5 (Week 1): Pt will demonstrate functional problem solving for mildly complex tasks with mod A verbal cues. SLP Short Term Goal 6 (Week 1): Pt will self-monitor and correct functional errors in functional problem solving tasks with mod A verbal cues.  Refer to Care Plan for Long Term Goals  Recommendations for other services: None   Discharge Criteria: Patient will be discharged from SLP if patient refuses treatment 3 consecutive times without medical reason, if treatment goals not met, if there is a change in medical status, if patient makes no progress towards goals or if patient is discharged from hospital.  The above assessment, treatment plan, treatment alternatives and goals were discussed and mutually agreed upon: by patient and by family  Lauryl Seyer  [facility] 08/12/2017, 4:35 PM

## 2017-08-13 ENCOUNTER — Inpatient Hospital Stay (HOSPITAL_COMMUNITY): Payer: Medicare Other

## 2017-08-13 DIAGNOSIS — I5032 Chronic diastolic (congestive) heart failure: Secondary | ICD-10-CM

## 2017-08-13 LAB — GLUCOSE, CAPILLARY
GLUCOSE-CAPILLARY: 174 mg/dL — AB (ref 65–99)
GLUCOSE-CAPILLARY: 210 mg/dL — AB (ref 65–99)
GLUCOSE-CAPILLARY: 193 mg/dL — AB (ref 65–99)
Glucose-Capillary: 138 mg/dL — ABNORMAL HIGH (ref 65–99)

## 2017-08-13 NOTE — Progress Notes (Signed)
Physical Therapy Session Note  Patient Details  Name: Fred [name] MRN: [id number] Date of Birth: [birth year]  Today's Date: 08/13/2017 PT Individual Time: 1302-1400 PT Individual Time Calculation (min): 58 min   Short Term Goals: Week 1:  PT Short Term Goal 1 (Week 1): Pt will perform squat pivot transfer to bed with mod assist  PT Short Term Goal 2 (Week 1): Pt will propell WC x 165ft with supervision assist  PT Short Term Goal 3 (Week 1): Pt will ambuate 42ft with max assist of 1.  PT Short Term Goal 4 (Week 1): Pt will maintian standing balance with mod assist and LRAD  PT Short Term Goal 5 (Week 1): pt will perform sit<>supine transfers with min assist from PT   Skilled Therapeutic Interventions/Progress Updates:    Pt supine in bed upon PT arrival, agreeable to therapy tx and denies pain. Pt performed rolling in each direction with mod assist in order to don pants. Pt transferred to sitting with max assist. Pt performed sit<>stand from EOB with max assist, blocking L knee. Squat pivot to w/c with max assist. Pt performed x 5 sit<>stands at the rail this session with max assist, therapist blocking L knee. While in standing pt worked on midline orientation with use of mirror, dynamic balance without UE support to perform UE task, manual facilitation for hip extension. Pt performed squat pivot transfer from w/c<>mat with max assist, manual facilitation for weightshifting. Sitting edge of mat pt worked on seated balance, pt with L lateral lean requiring verbal/tactile cues to correct. Pt performed w/c propulsion with R UE/LE and with min assist, verbal cues for techniques. Pt left seated in w/c with QRB in place and chair alarm set, family present.   Therapy Documentation Precautions:  Precautions Precautions: Fall Precaution Comments: L sided weakness Restrictions Weight Bearing Restrictions: No   See Function Navigator for Current Functional Status.   Therapy/Group: Individual  Therapy  Netta Corrigan, PT, DPT 08/13/2017, 8:00 AM

## 2017-08-13 NOTE — Progress Notes (Addendum)
Fred [name] PHYSICAL MEDICINE & REHABILITATION     PROGRESS NOTE  Subjective/Complaints:  Patient seen lying in bed this morning. He states he slept well overnight. Family at bedside. He states he good first in therapies yesterday.  ROS:  Denies CP, S OB, nausea, vomiting, diarrhea.  Objective: Vital Signs: Blood pressure 126/69, pulse (!) 101, temperature 99.2 F (37.3 C), temperature source Oral, resp. rate 17, SpO2 96 %. No results found. Recent Labs    08/11/17 0454 08/11/17 1851  WBC 7.2 8.3  HGB 13.1 14.0  HCT 38.9* 42.0  PLT 223 251   Recent Labs    08/11/17 0454 08/11/17 1851  NA 140  --   K 4.1  --   CL 112*  --   GLUCOSE 127*  --   BUN 16  --   CREATININE 1.08 1.05  CALCIUM 9.1  --    CBG (last 3)  Recent Labs    08/12/17 1708 08/12/17 2238 08/13/17 0620  GLUCAP 91 96 174*    Wt Readings from Last 3 Encounters:  08/10/17 84.4 kg (186 lb)  07/18/17 87.1 kg (192 lb)  07/11/17 86.6 kg (191 lb)    Physical Exam:  BP 126/69 (BP Location: Left Arm)   Pulse (!) 101   Temp 99.2 F (37.3 C) (Oral)   Resp 17   SpO2 96%  Constitutional: He appears well-developed and well-nourished.  NAD. HENT: Normocephalic.  Atraumatic. Eyes: EOMI.  No discharge.   Cardiovascular: RRR. No JVD. Respiratory: Effort normal.  Clear.  GI: He exhibits no distension. There is no tenderness.  Musculoskeletal: He exhibits no edema or tenderness. Neurological: He is alert and oriented 3 Follows full commands.  Fair awareness of deficits.  Motor: LUE 1/5 proximal to distal.  LLE 1/5 hip flexion, 0/5 distally (stable) Skin: Skin is warm and dry.  Psychiatric: He has a normal mood and affect. His behavior is normal.   Assessment/Plan: 1. 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 Physiatrist is providing close team supervision  and 24 hour management of active medical problems listed below. Physiatrist and rehab team continue to assess barriers to discharge/monitor patient progress toward functional and medical goals.  Function:  Bathing Bathing position   Position: Sitting EOB  Bathing parts Body parts bathed by patient: Left arm, Chest, Abdomen, Right upper leg, Left upper leg Body parts bathed by helper: Right arm, Front perineal area, Buttocks, Right lower leg, Back, Left lower leg  Bathing assist Assist Level: Touching or steadying assistance(Pt > 75%)      Upper Body Dressing/Undressing Upper body dressing   What is the patient wearing?: Pull over shirt/dress     Pull over shirt/dress - Perfomed by patient: Thread/unthread right sleeve Pull over shirt/dress - Perfomed by helper: Thread/unthread left sleeve, Put head through opening, Pull shirt over trunk        Upper body assist Assist Level: Touching or steadying assistance(Pt > 75%)      Lower Body Dressing/Undressing Lower body dressing   What is the patient wearing?: Pants, Non-skid slipper socks, Underwear   Underwear - Performed by helper: Thread/unthread right underwear leg, Thread/unthread left underwear leg, Pull underwear up/down   Pants- Performed by helper: Thread/unthread right pants leg, Thread/unthread left pants leg, Pull pants up/down   Non-skid slipper socks- Performed by helper: Don/doff right sock, Don/doff left sock  Lower body assist Assist for lower body dressing: Touching or steadying assistance (Pt > 75%)      Toileting Toileting     Toileting steps completed by helper: Adjust clothing prior to toileting, Performs perineal hygiene, Adjust clothing after toileting    Toileting assist Assist level: Two helpers   Transfers Chair/bed transfer   Chair/bed transfer method: Squat pivot Chair/bed transfer assist level: Maximal assist (Pt 25 - 49%/lift and lower) Chair/bed transfer assistive  device: Armrests     Locomotion Ambulation     Max distance: 30 Assist level: 2 helpers   Wheelchair   Type: Manual Max wheelchair distance: 150ft  Assist Level: Touching or steadying assistance (Pt > 75%)  Cognition Comprehension Comprehension assist level: Understands basic 75 - 89% of the time/ requires cueing 10 - 24% of the time  Expression Expression assist level: Expresses basic 75 - 89% of the time/requires cueing 10 - 24% of the time. Needs helper to occlude trach/needs to repeat words.  Social Interaction Social Interaction assist level: Interacts appropriately 75 - 89% of the time - Needs redirection for appropriate language or to initiate interaction.  Problem Solving Problem solving assist level: Solves basic 50 - 74% of the time/requires cueing 25 - 49% of the time  Memory Memory assist level: Recognizes or recalls 50 - 74% of the time/requires cueing 25 - 49% of the time    Medical Problem List and Plan:  1. 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 Aspirin and Plavix x3 weeks then aspirin alone   Continue CIR 2. DVT Prophylaxis/Anticoagulation: Subcutaneous Lovenox  3. Pain Management: Tylenol as needed  4. Mood: Provide emotional support  5. Neuropsych: This patient is capable of making decisions on his own behalf.  6. Skin/Wound Care: Routine skin checks  7. Fluids/Electrolytes/Nutrition: Routine in and outs   BMP within acceptable range on 5/31, labs pending 8. CAD with CABG. Continue aspirin and Plavix. No chest pain or shortness of breath  9. Diabetes mellitus. Hemoglobin A1c 6.0. SSI. Check blood sugars before meals and at bedtime. Glucophage 1000 mg twice daily, Amaryl 4 mg daily, Lantus insulin 10 units nightly, Tradjenta 5 mg daily   Labile, monitor for trend 10. Dysphagia. Dysphasia #2 nectar liquids. Follow-up speech therapy  11. Hypertension. Cozaar 25 mg daily, Toprol 50 mg  daily   Labile, monitor for trend 12. Diastolic congestive heart failure. Monitor for any signs of fluid overload. Patient was taking Lasix 40 mg daily if needed for fluid retention prior to admission.    Daily weights ordered There were no vitals filed for this visit. 13. History of seizure disorder. Depakote 500 mg daily.  14. Hyperlipidemia. Lipitor  15. History of alcohol use. Monitor for withdrawal   LOS (Days) 2 A FACE TO FACE EVALUATION WAS PERFORMED  Fred [name] 08/13/2017 7:09 AM

## 2017-08-13 NOTE — IPOC Note (Addendum)
Overall Plan of Care [facility]) Patient Details Name: Fred [name] MRN: [id number] DOB: [birth year]  Admitting Diagnosis: <principal problem not specified>  Hospital Problems: Active Problems:   Subcortical infarction ([place])   Small vessel disease ([place])   Left hemiparesis ([place])   Benign essential hypertension with delivery   Diabetes mellitus type 2 in obese [facility])     Functional Problem List: Nursing Nutrition, Bladder, Bowel, Safety, Endurance, Medication Management  PT Balance, Edema, Endurance, Motor, Nutrition, Sensory, Perception, Safety  OT Balance, Endurance, Safety, Motor, Sensory  SLP Cognition  TR         Basic ADL's: OT Eating, Grooming, Bathing, Dressing, Toileting     Advanced  ADL's: OT       Transfers: PT Bed Mobility, Bed to Chair, Car, Furniture, Futures trader, Metallurgist: PT Ambulation, Stairs, Emergency planning/management officer     Additional Impairments: OT    SLP Swallowing, Social Cognition   Memory, Problem Solving, Awareness  TR      Anticipated Outcomes Item Anticipated Outcome  Self Feeding S  Swallowing  Mod I   Basic self-care  min A  Toileting  min A    Bathroom Transfers min A   Bowel/Bladder  Manage bowel and bladder with min assist  Transfers  Min assist with LRAD   Locomotion  Supervision assist WC   Communication     Cognition  Supervision -Mod I  Pain     Safety/Judgment  maintain safety wtih min assist   Therapy Plan: PT Intensity: Minimum of 1-2 x/day ,45 to 90 minutes PT Frequency: 5 out of 7 days PT Duration Estimated Length of Stay: 21-24days  OT Intensity: Minimum of 1-2 x/day, 45 to 90 minutes OT Frequency: 5 out of 7 days OT Duration/Estimated Length of Stay: 21-24 days  SLP Intensity: Minumum of 1-2 x/day, 30 to 90 minutes SLP Frequency: 3 to 5 out of 7 days SLP Duration/Estimated Length of Stay: 20-28 days     Team Interventions: Nursing Interventions Patient/Family Education, Disease  Management/Prevention, Discharge Planning, Bladder Management, Medication Management, Dysphagia/Aspiration Precaution Training, Cognitive Remediation/Compensation  PT interventions Community reintegration, Ambulation/gait training, Training and development officer, Cognitive remediation/compensation, Discharge planning, Disease management/prevention, DME/adaptive equipment instruction, Functional electrical stimulation, Functional mobility training, Patient/family education, Pain management, Neuromuscular re-education, Psychosocial support, Skin care/wound management, Splinting/orthotics, Therapeutic Exercise, Therapeutic Activities, Stair training, UE/LE Strength taining/ROM, UE/LE Coordination activities, Visual/perceptual remediation/compensation, Wheelchair propulsion/positioning  OT Interventions Community reintegration, Neuromuscular re-education, Self Care/advanced ADL retraining, Patient/family education, Therapeutic Exercise, UE/LE Coordination activities, Discharge planning, DME/adaptive equipment instruction, Functional mobility training, Therapeutic Activities, UE/LE Strength taining/ROM  SLP Interventions Cueing hierarchy, Dysphagia/aspiration precaution training, Cognitive remediation/compensation, Functional tasks, Medication managment  TR Interventions    SW/CM Interventions Discharge Planning, Psychosocial Support, Patient/Family Education   Barriers to Discharge MD  Medical stability  Nursing      PT Inaccessible home environment, Home environment access/layout    OT Inaccessible home environment decreased IND with ADLs   SLP      SW       Team Discharge Planning: Destination: PT-Home ,OT- Home , SLP-Home Projected Follow-up: PT-Home health PT, OT-  Home health OT, SLP-24 hour supervision/assistance, Home Health SLP, Outpatient SLP(possibly ) Projected Equipment Needs: PT-Rolling walker with 5" wheels, Wheelchair (measurements), Wheelchair cushion (measurements), OT- 3 in 1  bedside comode, SLP-None recommended by SLP Equipment Details: PT- , OT-RW, TBD Patient/family involved in discharge planning: PT- Patient, Family member/caregiver,  OT-Patient, SLP-Patient, Family member/caregiver  MD ELOS: 20-23d  Medical Rehab Prognosis:  Good Assessment:  82 year old right-handed male with history of alcohol use, hypertension, seizure maintained on Depakote as well as history of CVA with little residual January 2019 and did receive TPA. CAD with CABG maintained on aspirin, transient atrial fibrillation, diastolic congestive heart failure. Per chart review patient lives with spouse. Independent and active. Two-level home with bedroom on Main level and 2 steps to entry. Excellent local family support. Presented 08/09/2017 with left-sided weakness and facial droop as well as slurred speech. Cranial CT scan negative. CT angiogram of head and neck showed mild less than 50% proximal right ICA stenosis and moderate to severe 70% proximal left ICA stenosis. MRI showed subcentimeter acute versus early subacute infarction within the right posterior limb of internal capsule. Echocardiogram with ejection fraction of 41% grade 1 diastolic dysfunction. Currently maintained on aspirin and Plavix for CVA prophylaxis x3 weeks then aspirin alone. Subcutaneous Lovenox for DVT prophylaxis. Dysphasia #2 nectar thick liquid diet.    Now requiring 24/7 Rehab RN,MD, as well as CIR level PT, OT and SLP.  Treatment team will focus on ADLs and mobility with goals set at [facility]    See Team Conference Notes for weekly updates to the plan of care

## 2017-08-14 ENCOUNTER — Inpatient Hospital Stay (HOSPITAL_COMMUNITY): Payer: Medicare Other | Admitting: Speech Pathology

## 2017-08-14 ENCOUNTER — Inpatient Hospital Stay (HOSPITAL_COMMUNITY): Payer: Medicare Other | Admitting: Occupational Therapy

## 2017-08-14 ENCOUNTER — Inpatient Hospital Stay (HOSPITAL_COMMUNITY): Payer: Medicare Other

## 2017-08-14 LAB — CBC WITH DIFFERENTIAL/PLATELET
ABS IMMATURE GRANULOCYTES: 0.1 10*3/uL (ref 0.0–0.1)
Basophils Absolute: 0.1 10*3/uL (ref 0.0–0.1)
Basophils Relative: 1 %
Eosinophils Absolute: 0.1 10*3/uL (ref 0.0–0.7)
Eosinophils Relative: 1 %
HEMATOCRIT: 41.8 % (ref 39.0–52.0)
HEMOGLOBIN: 13.8 g/dL (ref 13.0–17.0)
IMMATURE GRANULOCYTES: 1 %
LYMPHS ABS: 2.5 10*3/uL (ref 0.7–4.0)
Lymphocytes Relative: 26 %
MCH: 32.3 pg (ref 26.0–34.0)
MCHC: 33 g/dL (ref 30.0–36.0)
MCV: 97.9 fL (ref 78.0–100.0)
MONOS PCT: 14 %
Monocytes Absolute: 1.3 10*3/uL — ABNORMAL HIGH (ref 0.1–1.0)
NEUTROS ABS: 5.4 10*3/uL (ref 1.7–7.7)
NEUTROS PCT: 57 %
PLATELETS: 211 10*3/uL (ref 150–400)
RBC: 4.27 MIL/uL (ref 4.22–5.81)
RDW: 12.9 % (ref 11.5–15.5)
WBC: 9.4 10*3/uL (ref 4.0–10.5)

## 2017-08-14 LAB — COMPREHENSIVE METABOLIC PANEL
ALT: 17 U/L (ref 17–63)
AST: 15 U/L (ref 15–41)
Albumin: 3.2 g/dL — ABNORMAL LOW (ref 3.5–5.0)
Alkaline Phosphatase: 39 U/L (ref 38–126)
Anion gap: 12 (ref 5–15)
BILIRUBIN TOTAL: 1 mg/dL (ref 0.3–1.2)
BUN: 27 mg/dL — AB (ref 6–20)
CHLORIDE: 108 mmol/L (ref 101–111)
CO2: 22 mmol/L (ref 22–32)
CREATININE: 1.37 mg/dL — AB (ref 0.61–1.24)
Calcium: 9.4 mg/dL (ref 8.9–10.3)
GFR calc Af Amer: 55 mL/min — ABNORMAL LOW (ref >60)
GFR, EST NON AFRICAN AMERICAN: 47 mL/min — AB (ref >60)
Glucose, Bld: 168 mg/dL — ABNORMAL HIGH (ref 65–99)
Potassium: 4.9 mmol/L (ref 3.5–5.1)
Sodium: 142 mmol/L (ref 135–145)
Total Protein: 6.6 g/dL (ref 6.5–8.1)

## 2017-08-14 LAB — GLUCOSE, CAPILLARY
GLUCOSE-CAPILLARY: 158 mg/dL — AB (ref 65–99)
GLUCOSE-CAPILLARY: 168 mg/dL — AB (ref 65–99)
Glucose-Capillary: 152 mg/dL — ABNORMAL HIGH (ref 65–99)
Glucose-Capillary: 162 mg/dL — ABNORMAL HIGH (ref 65–99)

## 2017-08-14 NOTE — Progress Notes (Signed)
Occupational Therapy Session Note  Patient Details  Name: Fred [name] MRN: [id number] Date of Birth: Jul 21, 1935  Today's Date: 08/14/2017 OT Individual Time: 3545-6256 OT Individual Time Calculation (min): 72 min    Short Term Goals: Week 1:  OT Short Term Goal 1 (Week 1): Pt will complete UB dressing using hemi-plegic technique with min A OT Short Term Goal 2 (Week 1): Pt will complete toilet transfers to [facility] with max A  OT Short Term Goal 3 (Week 1): Pt will complete shower transfer using grab bars with max A OT Short Term Goal 4 (Week 1): Pt will complete stand at sink for up to 1 minute with mod A  OT Short Term Goal 5 (Week 1): Pt will complete UB washing with min A on tub bench   Skilled Therapeutic Interventions/Progress Updates:    Pt greeted supine in bed with dtr present. Agreeable to B/D and no c/o pain. Bathing completed EOB to focus on midline orientation/trunk strengthening. Pt required Mod A for sitting balance without R UE support. OT facilitated Lt UE weightbearing. Sit<stand with Mod A in [place] for perihygiene and elevating LB garments over hips. Pt requiring max cues for decreasing Lt lean and pushing tendencies. Hair brushing and shaving completed w/c level with OT facilitating Lt forearm weightbearing on sink. At end of session educated pt on joint protection and active assist activities, including towel slides, for NMR outside of tx. Pt left with all needs, half lap tray, and safety belt fastened.   Therapy Documentation Precautions:  Precautions Precautions: Fall Precaution Comments: L sided weakness Restrictions Weight Bearing Restrictions: No ADL: ADL ADL Comments: see functional navigator     See Function Navigator for Current Functional Status.   Therapy/Group: Individual Therapy  [name] A Azaiah Licciardi 08/14/2017, 12:38 PM

## 2017-08-14 NOTE — Progress Notes (Signed)
Social Work Assessment and Plan  Patient Details  Name: Fred [name] MRN: [id number] Date of Birth: 1935/07/21  Today's Date: 08/14/2017  Problem List:  Patient Active Problem List   Diagnosis Date Noted  . Diabetes mellitus type 2 in obese ([place])   . Small vessel disease ([place]) 08/11/2017  . Left hemiparesis ([place])   . Benign essential hypertension with delivery   . CVA (cerebral vascular accident) ([place]) 08/10/2017  . 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 of localized onset, not intractable, without status epilepticus ([place]) 06/20/2017  . Primary osteoarthritis of both knees 05/11/2017  . Stroke (cerebrum) ([place]) 03/21/2017  . Type 2 diabetes mellitus with complication, with long-term current use of insulin ([place]) 03/27/2015  . Atherosclerosis of native coronary artery of native heart without angina pectoris 03/27/2015  . Palpitations 11/04/2014  . S/P CABG (coronary artery bypass graft) 11/04/2014  . Atrial fibrillation, unspecified   . History of stroke 09/04/2014  . Obesity (BMI 30-39.9) 11/29/2013  . History of gout 05/16/2013  . Congestive heart failure ([place]) 04/20/2009  . DYSPNEA 02/11/2009  . CARDIOVASCULAR FUNCTION STUDY, ABNORMAL 01/29/2009  . NECK PAIN 08/03/2007  . ANEMIA / OTHER 05/16/2007  . Coronary atherosclerosis 05/16/2007  . PAROXYSMAL ATRIAL FIBRILLATION 05/16/2007  . Subcortical infarction ([place]) 05/16/2007  . Hyperlipidemia 03/30/2007  . DEGENERATIVE JOINT DISEASE 03/30/2007  . PAROTID LESION, UNSPECIFIED 09/29/2006  . Partial epilepsy with impairment of consciousness ([place]) 09/29/2006  . Diabetic polyneuropathy ([place]) 09/29/2006  . Essential hypertension 09/29/2006  . PERIPHERAL VASCULAR DISEASE 09/29/2006  . DIVERTICULOSIS 09/29/2006   Past Medical History:  Past Medical History:  Diagnosis Date  . Atrial fibrillation ([place])    Post operative  . CAD (coronary artery disease)    s/p cabg  . CHF (congestive  heart failure) ([place])   . Diverticulosis   . DJD (degenerative joint disease)   . DM (diabetes mellitus) ([place])   . History of anemia   . History of stroke   . Hyperlipidemia   . Hypertension   . 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 Peripheral vascular disease ([place])   . Polyneuropathy, diabetic ([place])   . Seizure disorder, complex partial ([place])   . Syncope and collapse    Past Surgical History:  Past Surgical History:  Procedure Laterality Date  . CATARACT EXTRACTION W/ INTRAOCULAR LENS  IMPLANT, BILATERAL  06/2013   Dr. [name]  . CORONARY ARTERY BYPASS GRAFT  02/13/2004   4 vesel  . removal of sebaceous cyst from neck  08/2007   Dr. [name]  . TONSILLECTOMY     Social History:  reports that he quit smoking about 40 years ago. His smoking use included cigarettes. He has a 22.00 pack-year smoking history. He has never used smokeless tobacco. He reports that he drinks alcohol. He reports that he does not use drugs.  Family / Support Systems Marital Status: Married How Long?: 70 years Patient Roles: Spouse, Parent, Other (Comment)(grandparent; great grandparent) Spouse/Significant Other: Charvez Voorhies - wife - [phone] Children: Dardan Shelton - dtr - [phone]; Chong Sicilian and [name] - dtrs Anticipated Caregiver: Wife, daughters, grand daughter Ability/Limitations of Caregiver: Wife has Lewybody dementia.  Daughter lives beside patient and niece lives behind patient. Caregiver Availability: 24/7 Family Dynamics: close, supportive family  Social History Preferred language: English Religion: Baptist Read: Yes Write: Yes Employment Status: Retired Date Retired/Disabled/Unemployed: 103 years ago Age Retired: 89 Public relations account executive Issues: none reported Guardian/Conservator: N/A - MD  has determined that pt is capable of making his own decisions.   Abuse/Neglect Abuse/Neglect Assessment Can Be Completed: Yes Physical Abuse:  Denies Verbal Abuse: Denies Sexual Abuse: Denies Exploitation of patient/patient's resources: Denies Self-Neglect: Denies  Emotional Status Pt's affect, behavior and adjustment status: Pt is very emotionally labile right now, especially when his wife entered the room.   Pt is motivated to get better. Recent Psychosocial Issues: Pt's wife has Lewy body dementia and he was caring for her. Psychiatric History: none reported Substance Abuse History: Hx of alcohol abuse reported in chart, but CSW did not have the opportunity to ask pt privately.  CSW to f/u on this.  Patient / Family Perceptions, Expectations & Goals Pt/Family understanding of illness & functional limitations: Pt/family report a good understanding of pt's condition and limitations.  They are encouraging and supportive. Premorbid pt/family roles/activities: Pt likes to be outside of his house doing things in the yard.  He also takes care of most everything inside the house, including his wife. Anticipated changes in roles/activities/participation: Pt wants to resume his activities as he is able. Pt/family expectations/goals: Pt wants to walk again and to regain use of his arm.  Community [employer] Agencies: None Premorbid Home Care/DME Agencies: Other (Comment)(rollator; tub transfer bench; bedside commode (borrowed)) Transportation available at discharge: family Resource referrals recommended: Neuropsychology, Support group (specify)  Discharge Planning Living Arrangements: Spouse/significant other Support Systems: Spouse/significant other, Children, Other relatives Type of Residence: Private residence Insurance Resources: [employer], Multimedia programmer (Nurse, learning disability) Financial Resources: Radio broadcast assistant Screen Referred: No Money Management: Patient Does the patient have any problems obtaining your medications?: No Home Management: Pt was doing most of this PTA.  Family will  assist while pt recovers. Patient/Family Preliminary Plans: Pt plans to return to his home with family to take turns being with him and his wife. Social Work Anticipated Follow Up Needs: HH/OP, Support Group(stroke support group) Expected length of stay: 21 to 24 days  Clinical Impression CSW met with pt and his dtr, Patty, to introduce self and role of CSW, as well as to complete assessment.  Pt was emotional when his wife came into room and she was trying to comfort him and encourage him.  Pt was taking care of everything for both of them at home PTA.  Dtr was a little emotional when pt was.  CSW explained this is normal following a stroke and that it should improve.  CSW also asked neuropsychologist to see pt tomorrow.  Family plans to pull together to take care of pt and wife.  CSW told Patty that pt would need min A level of care and she expressed understanding.  Good family support with 18 visitors last night.  No current concerns/questions/needs.  CSW will continue to follow and assist as needed.  Nabil Bubolz, Silvestre Mesi 08/14/2017, 2:56 PM

## 2017-08-14 NOTE — Progress Notes (Signed)
Physical Therapy Note  Patient Details  Name: LUISALBERTO BEEGLE MRN: [id number] Date of Birth: 1936-02-26 Today's Date: 08/14/2017  1438-1535, 57 min individual tx Pain: none per pt  PT donned TEDs bil LEs. Pt emotionally labile intermittently throughout session; PT educated pt and family about this issue.  W/c propulsion using hemi method over level tile x 70' with supervision/min assist for steering.    Neuromuscular re-education via forced use, multimodal cues for alternating reciprocal movements bil LEs using Kinetron in sitting, at 70 cm/ sec x 25 cycles x 2, and with LLE unilaterally, at 50 cm/sec, x 25 cycles  X 2.  Minimal hip/knee extension detected LLE.    Standing frame x 10 minutes for activity tolerance, wt bearing LUE and LLE.  Wt shifting L><R and terminal hip extension with assistance while in standing frame.  No c/o dizziness.  Pt noted to be wet; no awareness.  Pt left resting in w/c with quick release belt applied and all needs within reach. PT informed Haylee, NT that  p is wet.  See function navigator for current status.  Marlowe Cinquemani 08/14/2017, 12:33 PM

## 2017-08-14 NOTE — Progress Notes (Signed)
Inpatient [facility] Individual Statement of Services  Patient Name:  Fred [name]  Date:  08/14/2017  Welcome to the [facility].  Our goal is to provide you with an individualized program based on your diagnosis and situation, designed to meet your specific needs.  With this comprehensive rehabilitation program, you will be expected to participate in at least 3 hours of rehabilitation therapies Monday-Friday, with modified therapy programming on the weekends.  Your rehabilitation program will include the following services:  Physical Therapy (PT), Occupational Therapy (OT), Speech Therapy (ST), 24 hour per day rehabilitation nursing, Neuropsychology, Case Management (Social Worker), Rehabilitation Medicine, Nutrition Services and Pharmacy Services  Weekly team conferences will be held on Wednesdays to discuss your progress.  Your Social Worker will talk with you frequently to get your input and to update you on team discussions.  Team conferences with you and your family in attendance may also be held.  Expected length of stay:  21 to 24 days  Overall anticipated outcome:  Minimal assistance  Depending on your progress and recovery, your program may change. Your Social Worker will coordinate services and will keep you informed of any changes. Your Social Worker's name and contact numbers are listed  below.  The following services may also be recommended but are not provided by the [facility] will be made to provide these services after discharge if needed.  Arrangements include referral to agencies that provide these services.  Your insurance has been verified to be:  Medicare, [employer], and [employer] Your primary doctor is:  Dr. Beatrice Lecher  Pertinent information will be shared with your doctor and your  insurance company.  Social Worker:  Alfonse Alpers, LCSW  [phone] or [phone]  Information discussed with and copy given to patient by: Trey Sailors, 08/14/2017, 2:33 PM

## 2017-08-14 NOTE — Progress Notes (Signed)
[facility] PHYSICAL MEDICINE & REHABILITATION     PROGRESS NOTE  Subjective/Complaints:   Daughter at bedside, pt "moved Left arm in sleep".  No pain c/o.  DIscussed BP and DM control.  ROS:  Denies CP, S OB, nausea, vomiting, diarrhea.  Objective: Vital Signs: Blood pressure 129/66, pulse 81, temperature 98.2 F (36.8 C), temperature source Oral, resp. rate 16, weight 83.3 kg (183 lb 10.3 oz), SpO2 97 %. No results found. Recent Labs    08/11/17 1851  WBC 8.3  HGB 14.0  HCT 42.0  PLT 251   Recent Labs    08/11/17 1851  CREATININE 1.05   CBG (last 3)  Recent Labs    08/13/17 1716 08/13/17 2144 08/14/17 0619  GLUCAP 138* 193* 158*    Wt Readings from Last 3 Encounters:  08/14/17 83.3 kg (183 lb 10.3 oz)  08/10/17 84.4 kg (186 lb)  07/18/17 87.1 kg (192 lb)    Physical Exam:  BP 129/66 (BP Location: Left Arm)   Pulse 81   Temp 98.2 F (36.8 C) (Oral)   Resp 16   Wt 83.3 kg (183 lb 10.3 oz)   SpO2 97%   BMI 32.53 kg/m  Constitutional: He appears well-developed and well-nourished.  NAD. HENT: Normocephalic.  Atraumatic. Eyes: EOMI.  No discharge.   Cardiovascular: RRR. No JVD. Respiratory: Effort normal.  Clear.  GI: He exhibits no distension. There is no tenderness.  Musculoskeletal: He exhibits no edema or tenderness. Neurological: He is alert and oriented 3 Follows full commands.  Fair awareness of deficits.  Motor: LUE 0/5 proximal to distal.  LLE 1/5 hip add, 0/5 distally (stable) Skin: Skin is warm and dry.  Psychiatric: He has a normal mood and affect. His behavior is normal.   Assessment/Plan: 1. 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 Physiatrist is providing close team supervision and 24 hour management of active medical problems listed below. Physiatrist and rehab team continue to assess  barriers to discharge/monitor patient progress toward functional and medical goals.  Function:  Bathing Bathing position   Position: Sitting EOB  Bathing parts Body parts bathed by patient: Left arm, Chest, Abdomen, Right upper leg, Left upper leg Body parts bathed by helper: Right arm, Front perineal area, Buttocks, Right lower leg, Back, Left lower leg  Bathing assist Assist Level: Touching or steadying assistance(Pt > 75%)      Upper Body Dressing/Undressing Upper body dressing   What is the patient wearing?: Pull over shirt/dress     Pull over shirt/dress - Perfomed by patient: Thread/unthread right sleeve Pull over shirt/dress - Perfomed by helper: Thread/unthread left sleeve, Put head through opening, Pull shirt over trunk        Upper body assist Assist Level: Touching or steadying assistance(Pt > 75%)      Lower Body Dressing/Undressing Lower body dressing   What is the patient wearing?: Pants, Non-skid slipper socks, Underwear   Underwear - Performed by helper: Thread/unthread right underwear leg, Thread/unthread left underwear leg, Pull underwear up/down   Pants- Performed by helper: Thread/unthread right pants leg, Thread/unthread left pants leg, Pull pants up/down   Non-skid slipper socks- Performed by helper: Don/doff right sock, Don/doff left sock                  Lower body assist Assist for lower body dressing: Touching or steadying assistance (Pt > 75%)  Toileting Toileting     Toileting steps completed by helper: Adjust clothing prior to toileting, Performs perineal hygiene, Adjust clothing after toileting    Toileting assist Assist level: Two helpers   Transfers Chair/bed transfer   Chair/bed transfer method: Squat pivot Chair/bed transfer assist level: Maximal assist (Pt 25 - 49%/lift and lower) Chair/bed transfer assistive device: Armrests     Locomotion Ambulation     Max distance: 30 Assist level: 2 helpers   Wheelchair    Type: Manual Max wheelchair distance: 150 ft Assist Level: Touching or steadying assistance (Pt > 75%)  Cognition Comprehension Comprehension assist level: Understands basic 75 - 89% of the time/ requires cueing 10 - 24% of the time  Expression Expression assist level: Expresses basic 50 - 74% of the time/requires cueing 25 - 49% of the time. Needs to repeat parts of sentences.  Social Interaction Social Interaction assist level: Interacts appropriately 50 - 74% of the time - May be physically or verbally inappropriate.  Problem Solving Problem solving assist level: Solves basic 50 - 74% of the time/requires cueing 25 - 49% of the time  Memory Memory assist level: Recognizes or recalls 50 - 74% of the time/requires cueing 25 - 49% of the time    Medical Problem List and Plan:  1. 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 Aspirin and Plavix x3 weeks then aspirin alone   Continue CIR PT, OT 2. DVT Prophylaxis/Anticoagulation: Subcutaneous Lovenox  3. Pain Management: Tylenol as needed  4. Mood: Provide emotional support  5. Neuropsych: This patient is capable of making decisions on his own behalf.  6. Skin/Wound Care: Routine skin checks  7. Fluids/Electrolytes/Nutrition: Routine in and outs   BMP within acceptable range on 5/31,meals 40-90% 8. CAD with CABG. Continue aspirin and Plavix. No chest pain or shortness of breath  9. Diabetes mellitus. Hemoglobin A1c 6.0. SSI. Check blood sugars before meals and at bedtime. Glucophage 1000 mg twice daily, Amaryl 4 mg daily, Lantus insulin 10 units nightly, Tradjenta 5 mg daily   CBG controlled 6/3 10. Dysphagia. Dysphasia #2 nectar liquids. Follow-up speech therapy  11. Hypertension. Cozaar 25 mg daily, Toprol 50 mg daily  Vitals:   08/13/17 2107 08/14/17 0602  BP: (!) 127/56 129/66  Pulse: 83 81  Resp: 20 16  Temp: 98.6 F (37 C) 98.2 F (36.8 C)  SpO2: 95%  97%  Controlled 6/3 12. Diastolic congestive heart failure. Monitor for any signs of fluid overload. Patient was taking Lasix 40 mg daily if needed for fluid retention prior to admission.    Daily weights ordered Filed Weights   08/14/17 0602  Weight: 83.3 kg (183 lb 10.3 oz)   13. History of seizure disorder. Depakote 500 mg daily.  14. Hyperlipidemia. Lipitor  15. History of alcohol use. Monitor for withdrawal   LOS (Days) 3 A FACE TO FACE EVALUATION WAS PERFORMED  Charlett Blake 08/14/2017 7:12 AM

## 2017-08-14 NOTE — Progress Notes (Signed)
Speech Language Pathology Daily Session Note  Patient Details  Name: Fred [name] MRN: [id number] Date of Birth: 1935/09/05  Today's Date: 08/14/2017 SLP Individual Time: 1045-1140 SLP Individual Time Calculation (min): 55 min  Short Term Goals: Week 1: SLP Short Term Goal 1 (Week 1): Pt will demonstrate efficient mastication and oral clearance of dys 3 textured trials with min overt s/s aspiration to demonstrate readiness for solid diet advancement, SLP Short Term Goal 2 (Week 1): Pt will consume thin liquid trials with mod-min overt s/s aspiration in order to assess readiness for instrumental swallow assessment.  SLP Short Term Goal 3 (Week 1): Pt will participate in instrumental swallow assessment in order to determine least restrictive diet.  SLP Short Term Goal 4 (Week 1): Pt will utilize external memory aids to recall new, daily information with min A verbal cues.  SLP Short Term Goal 5 (Week 1): Pt will demonstrate functional problem solving for mildly complex tasks with mod A verbal cues. SLP Short Term Goal 6 (Week 1): Pt will self-monitor and correct functional errors in functional problem solving tasks with mod A verbal cues.  Skilled Therapeutic Interventions: Skilled treatment session focused on dysphagia and cognitive goals. SLP facilitated session by providing trials of ice chips and thin liquids via tsp and cup. Patient consumed trials without overt s/s of aspiration and Min A verbal cues to maximize leap seal and oral control. Recommend MBS tomorrow to assess swallow function prior to liquid upgrade. SLP also facilitated session by providing Mod A verbal cues for recall of his current medications and their functions. Will organize a BID pill box at next scheduled session. Patient left upright in wheelchair with all needs within reach and quick release belt in place. Continue with current plan of care.      Function:  Eating Eating   Modified Consistency Diet: No (with  trials from SLP) Eating Assist Level: Set up assist for;Supervision or verbal cues   Eating Set Up Assist For: Opening containers       Cognition Comprehension Comprehension assist level: Understands basic 75 - 89% of the time/ requires cueing 10 - 24% of the time  Expression   Expression assist level: Expresses basic 75 - 89% of the time/requires cueing 10 - 24% of the time. Needs helper to occlude trach/needs to repeat words.  Social Interaction Social Interaction assist level: Interacts appropriately 75 - 89% of the time - Needs redirection for appropriate language or to initiate interaction.  Problem Solving Problem solving assist level: Solves basic 50 - 74% of the time/requires cueing 25 - 49% of the time  Memory Memory assist level: Recognizes or recalls 50 - 74% of the time/requires cueing 25 - 49% of the time    Pain No/Denies Pain   Therapy/Group: Individual Therapy  Joyel Chenette 08/14/2017, 3:12 PM

## 2017-08-15 ENCOUNTER — Inpatient Hospital Stay (HOSPITAL_COMMUNITY): Payer: Medicare Other

## 2017-08-15 ENCOUNTER — Inpatient Hospital Stay (HOSPITAL_COMMUNITY): Payer: Medicare Other | Admitting: Speech Pathology

## 2017-08-15 ENCOUNTER — Inpatient Hospital Stay (HOSPITAL_COMMUNITY): Payer: Medicare Other | Admitting: Occupational Therapy

## 2017-08-15 ENCOUNTER — Inpatient Hospital Stay (HOSPITAL_COMMUNITY): Payer: Medicare Other | Admitting: Physical Therapy

## 2017-08-15 ENCOUNTER — Encounter (HOSPITAL_COMMUNITY): Payer: Medicare Other | Admitting: Psychology

## 2017-08-15 LAB — GLUCOSE, CAPILLARY
GLUCOSE-CAPILLARY: 153 mg/dL — AB (ref 65–99)
GLUCOSE-CAPILLARY: 81 mg/dL (ref 65–99)
Glucose-Capillary: 171 mg/dL — ABNORMAL HIGH (ref 65–99)
Glucose-Capillary: 145 mg/dL — ABNORMAL HIGH (ref 65–99)

## 2017-08-15 MED ORDER — TRAZODONE HCL 50 MG PO TABS
50.0000 mg | ORAL_TABLET | Freq: Every evening | ORAL | Status: DC | PRN
  Administered 2017-08-16: 50 mg via ORAL
  Filled 2017-08-15: qty 1

## 2017-08-15 NOTE — Progress Notes (Signed)
Modified Barium Swallow Progress Note  Patient Details  Name: Fred [name] MRN: [id number] Date of Birth: Nov 28, 1935  Today's Date: 08/15/2017  Modified Barium Swallow completed.  Full report located under Chart Review in the Imaging Section.  Brief recommendations include the following:  Clinical Impression  Pt presents with mild oral phase and functional pharyngeal phase for age. Pt with impaired mastication of peaches resulting in increased oral phase but no oral residue after swallow. Likely d/t edentulous status. Pt with functional pharyngeal phase consisting of swallow initiation at the valleculae with thin liquids via cup and straw (only 1 instance of mild flash penetration with thin liquids over many trials) and very trace amount of residue in valleculae. No aspiration was observed.  Recommend dysphagia 2 with thin liquids, may use straw, medicine whole with thin liquids and intermittent supervision.    Swallow Evaluation Recommendations       SLP Diet Recommendations: Dysphagia 2 (Fine chop) solids;Thin liquid   Liquid Administration via: Straw;Cup   Medication Administration: Whole meds with liquid   Supervision: Patient able to self feed;Intermittent supervision to cue for compensatory strategies   Compensations: Slow rate;Small sips/bites;Lingual sweep for clearance of pocketing;Minimize environmental distractions   Postural Changes: Seated upright at 90 degrees   Oral Care Recommendations: Oral care BID        Nathaniel Wakeley 08/15/2017,10:25 AM

## 2017-08-15 NOTE — Progress Notes (Signed)
[facility] PHYSICAL MEDICINE & REHABILITATION     PROGRESS NOTE  Subjective/Complaints:   Had a god night.  No bowel or bladder issues, no pains,    ROS:  Denies CP, S OB, nausea, vomiting, diarrhea.  Objective: Vital Signs: Blood pressure (!) 119/59, pulse 78, temperature 98.2 F (36.8 C), temperature source Oral, resp. rate 17, weight 82.6 kg (182 lb 1.6 oz), SpO2 98 %. No results found. Recent Labs    08/14/17 0645  WBC 9.4  HGB 13.8  HCT 41.8  PLT 211   Recent Labs    08/14/17 0645  NA 142  K 4.9  CL 108  GLUCOSE 168*  BUN 27*  CREATININE 1.37*  CALCIUM 9.4   CBG (last 3)  Recent Labs    08/14/17 1648 08/14/17 2103 08/15/17 0616  GLUCAP 162* 168* 171*    Wt Readings from Last 3 Encounters:  08/15/17 82.6 kg (182 lb 1.6 oz)  08/10/17 84.4 kg (186 lb)  07/18/17 87.1 kg (192 lb)    Physical Exam:  BP (!) 119/59 (BP Location: Right Arm)   Pulse 78   Temp 98.2 F (36.8 C) (Oral)   Resp 17   Wt 82.6 kg (182 lb 1.6 oz)   SpO2 98%   BMI 32.26 kg/m  Constitutional: He appears well-developed and well-nourished.  NAD. HENT: Normocephalic.  Atraumatic. Eyes: EOMI.  No discharge.   Cardiovascular: RRR. No JVD. Respiratory: Effort normal.  Clear.  GI: He exhibits no distension. There is no tenderness.  Musculoskeletal: He exhibits no edema or tenderness. Neurological: He is alert and oriented 3 Follows full commands.  Fair awareness of deficits.  Motor: LUE 0/5 proximal to distal.  LLE 1/5 hip add, 0/5 distally (stable) Skin: Skin is warm and dry.  Psychiatric: He has a normal mood and affect. His behavior is normal.   Assessment/Plan: 1. 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 Physiatrist is providing close team supervision and 24 hour management of active medical problems listed below. Physiatrist and  rehab team continue to assess barriers to discharge/monitor patient progress toward functional and medical goals.  Function:  Bathing Bathing position   Position: Sitting EOB  Bathing parts Body parts bathed by patient: Chest, Abdomen, Right upper leg, Left upper leg, Front perineal area Body parts bathed by helper: Buttocks, Right lower leg, Left lower leg, Back, Right arm, Left arm  Bathing assist Assist Level: Touching or steadying assistance(Pt > 75%)      Upper Body Dressing/Undressing Upper body dressing   What is the patient wearing?: Pull over shirt/dress     Pull over shirt/dress - Perfomed by patient: Thread/unthread right sleeve, Put head through opening Pull over shirt/dress - Perfomed by helper: Thread/unthread left sleeve, Pull shirt over trunk        Upper body assist Assist Level: (Mod A)      Lower Body Dressing/Undressing Lower body dressing   What is the patient wearing?: Pants, Non-skid slipper socks, Underwear   Underwear - Performed by helper: Thread/unthread right underwear leg, Thread/unthread left underwear leg, Pull underwear up/down   Pants- Performed by helper: Thread/unthread right pants leg, Thread/unthread left pants leg, Pull pants up/down   Non-skid slipper socks- Performed by helper: Don/doff right sock, Don/doff left sock                  Lower body assist Assist for  lower body dressing: Touching or steadying assistance (Pt > 75%)      Toileting Toileting Toileting activity did not occur: No continent bowel/bladder event   Toileting steps completed by helper: Adjust clothing prior to toileting, Performs perineal hygiene, Adjust clothing after toileting    Toileting assist Assist level: Two helpers   Transfers Chair/bed transfer   Chair/bed transfer method: Squat pivot Chair/bed transfer assist level: Maximal assist (Pt 25 - 49%/lift and lower) Chair/bed transfer assistive device: Armrests     Locomotion Ambulation      Max distance: 30 Assist level: 2 helpers   Wheelchair   Type: Manual Max wheelchair distance: 70 Assist Level: Touching or steadying assistance (Pt > 75%)  Cognition Comprehension Comprehension assist level: Understands basic 75 - 89% of the time/ requires cueing 10 - 24% of the time  Expression Expression assist level: Expresses basic 75 - 89% of the time/requires cueing 10 - 24% of the time. Needs helper to occlude trach/needs to repeat words.  Social Interaction Social Interaction assist level: Interacts appropriately 75 - 89% of the time - Needs redirection for appropriate language or to initiate interaction.  Problem Solving Problem solving assist level: Solves basic 75 - 89% of the time/requires cueing 10 - 24% of the time  Memory Memory assist level: Recognizes or recalls 75 - 89% of the time/requires cueing 10 - 24% of the time    Medical Problem List and Plan:  1. 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 Aspirin and Plavix x3 weeks then aspirin alone   Continue CIR PT, OT team conf in am 2. DVT Prophylaxis/Anticoagulation: Subcutaneous Lovenox  3. Pain Management: Tylenol as needed  4. Mood: Provide emotional support  5. Neuropsych: This patient is capable of making decisions on his own behalf.  6. Skin/Wound Care: Routine skin checks  7. Fluids/Electrolytes/Nutrition: Routine in and outs   BMP within acceptable range on 5/31,meals 40-90% 8. CAD with CABG. Continue aspirin and Plavix. No chest pain or shortness of breath  9. Diabetes mellitus. Hemoglobin A1c 6.0. SSI. Check blood sugars before meals and at bedtime. Glucophage 1000 mg twice daily, Amaryl 4 mg daily, Lantus insulin 10 units nightly, Tradjenta 5 mg daily  CBG (last 3)  Recent Labs    08/14/17 1648 08/14/17 2103 08/15/17 0616  GLUCAP 162* 168* 171*    CBG controlled 6/4 10. Dysphagia. Dysphasia #2 nectar liquids. Follow-up  speech therapy  11. Hypertension. Cozaar 25 mg daily, Toprol 50 mg daily  Vitals:   08/14/17 2117 08/15/17 0416  BP: 138/68 (!) 119/59  Pulse: 78 78  Resp: 18 17  Temp: 97.8 F (36.6 C) 98.2 F (36.8 C)  SpO2: 96% 98%  Controlled 6/4 12. Diastolic congestive heart failure. Monitor for any signs of fluid overload. Patient was taking Lasix 40 mg daily if needed for fluid retention prior to admission.    Daily weights ordered Filed Weights   08/14/17 0602 08/15/17 0416  Weight: 83.3 kg (183 lb 10.3 oz) 82.6 kg (182 lb 1.6 oz)   13. History of seizure disorder. Depakote 500 mg daily.  14. Hyperlipidemia. Lipitor  15. History of alcohol use. Monitor for withdrawal   LOS (Days) 4 A FACE TO FACE EVALUATION WAS PERFORMED  Charlett Blake 08/15/2017 7:21 AM

## 2017-08-15 NOTE — Progress Notes (Signed)
Occupational Therapy Session Note  Patient Details  Name: Fred [name] MRN: [id number] Date of Birth: 12/22/1935  Today's Date: 08/15/2017 OT Individual Time: 1302-1400 OT Individual Time Calculation (min): 58 min   Short Term Goals: Week 1:  OT Short Term Goal 1 (Week 1): Pt will complete UB dressing using hemi-plegic technique with min A OT Short Term Goal 2 (Week 1): Pt will complete toilet transfers to BSC with max A  OT Short Term Goal 3 (Week 1): Pt will complete shower transfer using grab bars with max A OT Short Term Goal 4 (Week 1): Pt will complete stand at sink for up to 1 minute with mod A  OT Short Term Goal 5 (Week 1): Pt will complete UB washing with min A on tub bench   Skilled Therapeutic Interventions/Progress Updates:    Pt greeted semi-reclined in bed and agreeable to OT treatment session. Assistance to advance LLE off of bed and max A to elevate trunk. Worked on LB dressing to don shoes at EOB with pt able to push R LE into slide on shoe, but needed max A for L shoe. Stand-pivot to wc with max A to stand, then max A to facilitate pivot. Wc propulsion to BI gym with min A. Worked on core strengthening, anterior weight shift and reaction time with dynavision activity. L UE NMR with weight bearing towel pushes. Provided joint input to bring pt through full ROM. Trace scap elevation/depression noted. Pt returned to room and left seated in wc with safety belt on and needs met.   Therapy Documentation Precautions:  Precautions Precautions: Fall Precaution Comments: L sided weakness Restrictions Weight Bearing Restrictions: No   Pain:  none/denies pain ADL: ADL ADL Comments: see functional navigator  See Function Navigator for Current Functional Status.  Therapy/Group: Individual Therapy   S  08/15/2017, 2:02 PM 

## 2017-08-15 NOTE — Progress Notes (Signed)
Physical Therapy Session Note  Patient Details  Name: Fred [name] MRN: [id number] Date of Birth: February 11, 1936  Today's Date: 08/15/2017 PT Individual Time: 0800-0855 PT Individual Time Calculation (min): 55 min   Short Term Goals: Week 1:  PT Short Term Goal 1 (Week 1): Pt will perform squat pivot transfer to bed with mod assist  PT Short Term Goal 2 (Week 1): Pt will propell WC x 152f with supervision assist  PT Short Term Goal 3 (Week 1): Pt will ambuate 15fwith max assist of 1.  PT Short Term Goal 4 (Week 1): Pt will maintian standing balance with mod assist and LRAD  PT Short Term Goal 5 (Week 1): pt will perform sit<>supine transfers with min assist from PT   Skilled Therapeutic Interventions/Progress Updates:   Pt in supine and agreeable to therapy, denies pain. Transferred to EOB w/ max assist 2/2 posterior lean bias. Maintained intermittent static and dynamic sitting balance w/ min assist while therapist provided max-total assist to doff and don new shirt and thread LE garments. Total assist to don socks and shoes for time management. Transferred to w/c via max assist squat pivot to R side. Pt self-propelled w/c to/from therapy gym using R hemi technique to work on independence w/ locomotion, supervision assist overall w/ occasional min assist to negotiate obstacles. Verbal and visual cues for propulsion technique coordinating RUE and RLE. Worked on sit<>stands in parallel bars, performed 7-8 overall w/ max assist each to boost into standing and block L knee. Used mirror feedback for midline orientation and posture. Manual facilitation of L hip extension and increased thoracic extension w/ each stand. Mild pushing to L, able to correct w/ max manual and verbal cues. Returned to room ended session back in supine, call bell within reach and all needs met.   Therapy Documentation Precautions:  Precautions Precautions: Fall Precaution Comments: L sided weakness Restrictions Weight  Bearing Restrictions: No  See Function Navigator for Current Functional Status.   Therapy/Group: Individual Therapy  Mekaela Azizi K Arnet[facility] 08/15/2017, 8:56 AM

## 2017-08-15 NOTE — Plan of Care (Signed)
  Problem: Consults Goal: RH STROKE PATIENT EDUCATION Description See Patient Education module for education specifics  Outcome: Progressing   Problem: RH BOWEL ELIMINATION Goal: RH STG MANAGE BOWEL W/MEDICATION W/ASSISTANCE Description STG Manage Bowel with Medication with  Mod I Assistance.  Outcome: Progressing   Problem: RH BLADDER ELIMINATION Goal: RH STG MANAGE BLADDER WITH ASSISTANCE Description STG Manage Bladder With  Min Assistance  Outcome: Progressing   Problem: RH SAFETY Goal: RH STG ADHERE TO SAFETY PRECAUTIONS W/ASSISTANCE/DEVICE Description STG Adhere to Safety Precautions With cues/ supervision Assistance/Device.  Outcome: Progressing   Problem: RH KNOWLEDGE DEFICIT Goal: RH STG INCREASE KNOWLEDGE OF DIABETES Description Pt will be able to explain how to monitor and manage diabetes/diet/medications using notes/resources independently  Outcome: Progressing Goal: RH STG INCREASE KNOWLEDGE OF HYPERTENSION Description Pt will be able to demo understanding of HH/CMM diet and medications to manage HTN and state what to do to manage HTN and when to take medications  Outcome: Progressing Goal: RH STG INCREASE KNOWLEDGE OF DYSPHAGIA/FLUID INTAKE Description Pt and family will be able to explain and demo understanding of dysphagia restrictions and diet limitations due to dysphagia using cues/ resources/reminders  Outcome: Progressing

## 2017-08-15 NOTE — Progress Notes (Signed)
Speech Language Pathology Daily Session Note  Patient Details  Name: Fred [name] MRN: [id number] Date of Birth: [birth year]  Today's Date: 08/15/2017 SLP Individual Time: 7253-6644 SLP Individual Time Calculation (min): 17 min  Short Term Goals: Week 1: SLP Short Term Goal 1 (Week 1): Pt will demonstrate efficient mastication and oral clearance of dys 3 textured trials with min overt s/s aspiration to demonstrate readiness for solid diet advancement, SLP Short Term Goal 2 (Week 1): Pt will consume thin liquid trials with mod-min overt s/s aspiration in order to assess readiness for instrumental swallow assessment.  SLP Short Term Goal 3 (Week 1): Pt will participate in instrumental swallow assessment in order to determine least restrictive diet.  SLP Short Term Goal 4 (Week 1): Pt will utilize external memory aids to recall new, daily information with min A verbal cues.  SLP Short Term Goal 5 (Week 1): Pt will demonstrate functional problem solving for mildly complex tasks with mod A verbal cues. SLP Short Term Goal 6 (Week 1): Pt will self-monitor and correct functional errors in functional problem solving tasks with mod A verbal cues.  Skilled Therapeutic Interventions: Skilled treatment session focused on education regarding results of MBS. SLP revisited pt once in room, posted information at head of bed, updated safety plan and provided education to pt and visitor on d/c nectar thick liquids and pt can consume thin liquids. Pt began crying as he "loves plain water." Discussed remaining on current dysphagia 2 diet until further assessment at bedside with tray. Pt agreeable and all questions answered to pt satisfaction. Primary SLP made aware of results.      Function:    Cognition Comprehension Comprehension assist level: Understands basic 75 - 89% of the time/ requires cueing 10 - 24% of the time  Expression   Expression assist level: Expresses basic 75 - 89% of the time/requires  cueing 10 - 24% of the time. Needs helper to occlude trach/needs to repeat words.  Social Interaction Social Interaction assist level: Interacts appropriately 75 - 89% of the time - Needs redirection for appropriate language or to initiate interaction.  Problem Solving Problem solving assist level: Solves basic 75 - 89% of the time/requires cueing 10 - 24% of the time  Memory      Pain Pain Assessment Pain Scale: 0-10 Pain Score: 0-No pain  Therapy/Group: Individual Therapy  Fred [name] 08/15/2017, 10:28 AM

## 2017-08-15 NOTE — Progress Notes (Signed)
Physical Therapy Session Note  Patient Details  Name: Fred [name] MRN: [id number] Date of Birth: [birth year]  Today's Date: 08/15/2017 PT Individual Time: 3329-5188 PT Individual Time Calculation (min): 57 min   Short Term Goals: Week 1:  PT Short Term Goal 1 (Week 1): Pt will perform squat pivot transfer to bed with mod assist  PT Short Term Goal 2 (Week 1): Pt will propell WC x 117ft with supervision assist  PT Short Term Goal 3 (Week 1): Pt will ambuate 68ft with max assist of 1.  PT Short Term Goal 4 (Week 1): Pt will maintian standing balance with mod assist and LRAD  PT Short Term Goal 5 (Week 1): pt will perform sit<>supine transfers with min assist from PT   Skilled Therapeutic Interventions/Progress Updates:    Pt seated in w/c upon PT arrival, agreeable to therapy tx and denies pain. Pt propelled w/c from room>gym x 150 ft with supervision and increased time, R hemi technique. Pt performed squat pivot transfer from w/c>mat max assist. Pt worked on seated balance, core strengthening and R UE weightbearing in order to perform sitting<>side propped on elbow x 10 each side with min to mod assist. Pt worked on reciprocal scooting with min assist to scoot R hip and max assist to scoot L hip, manual facilitation for weightshifting. Pt transferred from sitting>L sidelying>supine>R sidelying with emphasis on bed mobility techniques, verbal/tactile cues for techniques. In R sidelying pt worked on gravity eliminated hip flexion/extension AAROM with L LE for NMR using powder board, 2 x 10. Pt transferred to supine, performed x 10 bridges with manual facilitation for use of L LE. Pt transferred to sitting with mod assist and emphasis on techniques. Pt worked on dynamic seated balance to perform L UE task reaching outside BOS for bean bags to toss, therapist providing verbal/tactile cues for upright posture and providing manual facilitation for increased R UE weightbearing through the elbow on the  mat. Pt performed squat pivot transfer to w/c from mat, max assist. Pt propelled w/c back to room and left seated in w/c with needs in reach and QRB in place.   Therapy Documentation Precautions:  Precautions Precautions: Fall Precaution Comments: L sided weakness Restrictions Weight Bearing Restrictions: No   See Function Navigator for Current Functional Status.   Therapy/Group: Individual Therapy  Netta Corrigan, PT, DPT 08/15/2017, 12:07 PM

## 2017-08-15 NOTE — Consult Note (Signed)
Neuropsychological Consultation   Patient:   Fred [name]   DOB:   [birth year]  MR Number:  [id number]  Location:  [facility] [id number] [place] [address] Dept: [phone] WLN: [id number]           Date of Service:   08/15/2017  Start Time:   3:30 PM End Time:   4:30 PM  Provider/Observer:  Ilean Skill, Psy.D.       Clinical Neuropsychologist       Billing Code/Service: [phone] 4 Units  Chief Complaint:    Grove Defina is an 82 year old right-handed male who has a history of alcohol use, hypertension, seizures maintained on Depakote as well as a history of prior CVA.  The recent stroke occurred in January 2019 he did receive TPA at that time.  The patient had recovered mostly to baseline prior to the most recent stroke.  The patient also has CAD with CABG maintained on aspirin, transient atrial fibrillation and diastolic congestive heart failure.  The patient presented on 08/09/2017 with left-sided weakness and facial droop as well as slurred speech.  MRI showed subcentimeter acute versus early subacute infarction within the right posterior limb of the internal capsule.  The patient has had ongoing motor deficits/hemiparesis on the left side of his body.  Speech is improved but there is some slurring due to primary motor weakness on the left side.  The patient is also described by his family as well as view during the clinical interview of spontaneous or sudden crying.  The patient denies feeling depressed or sad prior to the sudden onset of crying.  This may be indicative of pseudobulbar affect but at this point will be an acute symptom.  The patient denies any significant depression or anxiety.  Reason for Service:  The patient was referred for neuropsychological consult due coping and adjustment issues following his most recent Cerebrovascular accidentaffecting the primary motor white  matter tracts in the right posterior limb of the internal capsule.  Below is the HPI for the current admission.   HPI: LEEON MAKAR is an 82 year old right-handed male with history of alcohol use, hypertension, seizure maintained on Depakote as well as history of CVA with little residual January 2019 and did receive TPA. CAD with CABG maintained on aspirin, transient atrial fibrillation, diastolic congestive heart failure. Per chart review patient lives with spouse. Independent and active. Two-level home with bedroom on Main level and 2 steps to entry. Excellent local family support. Presented 08/09/2017 with left-sided weakness and facial droop as well as slurred speech. Cranial CT scan negative. CT angiogram of head and neck showed mild less than 50% proximal right ICA stenosis and moderate to severe 70% proximal left ICA stenosis. MRI showed subcentimeter acute versus early subacute infarction within the right posterior limb of internal capsule. Echocardiogram with ejection fraction of 48% grade 1 diastolic dysfunction. Currently maintained on aspirin and Plavix for CVA prophylaxis x3 weeks then aspirin alone. Subcutaneous Lovenox for DVT prophylaxis. Dysphasia #2 nectar thick liquid diet. Physical and occupational therapy evaluations completed with recommendations of physical medicine rehab consult. Patient was admitted for a comprehensive rehab program.  Current Status:  The patient continues to have significant left-sided motor deficits affecting both the left arm and left leg.  The patient also has some motor deficits on the left side of his face affecting affected pronunciation and speech.  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  This would likely be due to lesioning in the white matter tracts connecting frontal lobe and medulla brain regions.  Behavioral  Observation: RIVERS HAMRICK  presents as a 82 y.o.-year-old Right Caucasian Male who appeared his stated age. his dress was Appropriate and he was Well Groomed and his manners were Appropriate to the situation.  his participation was indicative of Appropriate behaviors.  There were any physical disabilities noted.  he displayed an appropriate level of cooperation and motivation.     Interactions:    Active Appropriate and Inattentive  Attention:   abnormal and attention span appeared shorter than expected for age  Memory:   within normal limits; recent and remote memory intact  Visuo-spatial:  within normal limits  Speech (Volume):  low  Speech:   slurred; normal  Thought Process:  Coherent and Relevant  Though Content:  WNL; not suicidal and not homicidal  Orientation:   person, place, time/date and situation  Judgment:   Good  Planning:   Good  Affect:    Labile  Mood:    Dysphoric  Insight:   Good  Medical History:   Past Medical History:  Diagnosis Date  . Atrial fibrillation ([facility])    Post operative  . CAD (coronary artery disease)    s/p cabg  . CHF (congestive heart failure) ([facility])   . Diverticulosis   . DJD (degenerative joint disease)   . DM (diabetes mellitus) ([facility])   . History of anemia   . History of stroke   . Hyperlipidemia   . Hypertension   . 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 Peripheral vascular disease ([facility])   . Polyneuropathy, diabetic ([place])   . Seizure disorder, complex partial ([facility])   . Syncope and collapse    Psychiatric History:  No prior history of depression or anxiety disorders or other significant psychiatric illness.  Family Med/Psych History:  Family History  Problem Relation Age of Onset  . Epilepsy Father   . Stroke Mother   . Lung cancer Unknown     Risk of Suicide/Violence: low the patient denies any suicidal or homicidal ideation.  Impression/DX:  Marrion Accomando is an 82 year old right-handed male  who has a history of alcohol use, hypertension, seizures maintained on Depakote as well as a history of prior CVA.  The recent stroke occurred in January 2019 he did receive TPA at that time.  The patient had recovered mostly to baseline prior to the most recent stroke.  The patient also has CAD with CABG maintained on aspirin, transient atrial fibrillation and diastolic congestive heart failure.  The patient presented on 08/09/2017 with left-sided weakness and facial droop as well as slurred speech.  MRI showed subcentimeter acute versus early subacute infarction within the right posterior limb of the internal capsule.  The patient has had ongoing motor deficits/hemiparesis on the left side of his body.  Speech is improved but there is some slurring due to primary motor weakness on the left side.  The patient is also described by his family as well as view during the clinical interview of spontaneous or sudden crying.  The patient denies feeling depressed or sad prior to the sudden onset of crying.  This may be indicative of pseudobulbar affect but at this point will be an acute symptom.  The patient denies any significant depression or anxiety.  The patient continues to  have significant left-sided motor deficits affecting both the left arm and left leg.  The patient also has some motor deficits on the left side of his face affecting affected pronunciation and speech.  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  This would likely be due to lesioning in the white matter tracts connecting frontal lobe and medulla brain regions.  We will continue to assess the potential for neurologic indications of pseudobulbar affect.  Disposition/Plan:  I will be available to see the patient again either later this week or the first of next week if further issues arise.  I have had a discussion  with the patient's family while the patient was present regarding current course and situation.          Electronically Signed   _______________________ Ilean Skill, Psy.D.

## 2017-08-16 ENCOUNTER — Inpatient Hospital Stay (HOSPITAL_COMMUNITY): Payer: Medicare Other | Admitting: Physical Therapy

## 2017-08-16 ENCOUNTER — Other Ambulatory Visit: Payer: Self-pay

## 2017-08-16 ENCOUNTER — Inpatient Hospital Stay (HOSPITAL_COMMUNITY): Payer: Medicare Other | Admitting: Speech Pathology

## 2017-08-16 ENCOUNTER — Inpatient Hospital Stay (HOSPITAL_COMMUNITY): Payer: Medicare Other

## 2017-08-16 ENCOUNTER — Inpatient Hospital Stay (HOSPITAL_COMMUNITY): Payer: Medicare Other | Admitting: Occupational Therapy

## 2017-08-16 LAB — GLUCOSE, CAPILLARY
GLUCOSE-CAPILLARY: 108 mg/dL — AB (ref 65–99)
GLUCOSE-CAPILLARY: 160 mg/dL — AB (ref 65–99)
Glucose-Capillary: 144 mg/dL — ABNORMAL HIGH (ref 65–99)
Glucose-Capillary: 101 mg/dL — ABNORMAL HIGH (ref 65–99)

## 2017-08-16 MED ORDER — TRAZODONE HCL 50 MG PO TABS
25.0000 mg | ORAL_TABLET | Freq: Every evening | ORAL | Status: DC | PRN
  Administered 2017-08-16 – 2017-09-01 (×13): 50 mg via ORAL
  Filled 2017-08-16 (×15): qty 1

## 2017-08-16 MED ORDER — SENNOSIDES-DOCUSATE SODIUM 8.6-50 MG PO TABS
2.0000 | ORAL_TABLET | Freq: Two times a day (BID) | ORAL | Status: DC
  Administered 2017-08-16 – 2017-08-17 (×4): 2 via ORAL
  Filled 2017-08-16 (×5): qty 2

## 2017-08-16 NOTE — Progress Notes (Signed)
[place] PHYSICAL MEDICINE & REHABILITATION     PROGRESS NOTE  Subjective/Complaints:   No issues overnite.  Discussed Neuropsych eval as well as emotional lability.  Pt does not feel like he'd want to start a medicine for this .  ROS:  Denies CP, S OB, nausea, vomiting, diarrhea.  Objective: Vital Signs: Blood pressure 119/62, pulse 86, temperature 98 F (36.7 C), temperature source Oral, resp. rate 18, weight 83 kg (182 lb 15.7 oz), SpO2 97 %. Dg Swallowing Func-speech Pathology  Result Date: 08/15/2017 Objective Swallowing Evaluation: Type of Study: MBS-Modified Barium Swallow Study  Patient Details Name: Fred [name] MRN: [id number] Date of Birth: 11-Aug-1935 Today's Date: 08/15/2017 Time: SLP Start Time (ACUTE ONLY): 1012 -SLP Stop Time (ACUTE ONLY): 1026 SLP Time Calculation (min) (ACUTE ONLY): 14 min Past Medical History: Past Medical History: Diagnosis Date . Atrial fibrillation ([place])   Post operative . CAD (coronary artery disease)   s/p cabg . CHF (congestive heart failure) ([place])  . Diverticulosis  . DJD (degenerative joint disease)  . DM (diabetes mellitus) ([place])  . History of anemia  . History of stroke  . Hyperlipidemia  . Hypertension  . 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 Peripheral vascular disease ([place])  . Polyneuropathy, diabetic ([place])  . Seizure disorder, complex partial ([place])  . Syncope and collapse  Past Surgical History: Past Surgical History: Procedure Laterality Date . CATARACT EXTRACTION W/ INTRAOCULAR LENS  IMPLANT, BILATERAL  06/2013  Dr. [name] . CORONARY ARTERY BYPASS GRAFT  02/13/2004  4 vesel . removal of sebaceous cyst from neck  08/2007  Dr. [name] . TONSILLECTOMY   HPI: Pt is an 82 y/o male admitted secondary to L sided weakness. Imaging revealed acute/early subacute infarct in the R posterior limb of the internal capsule. PMH includes CAD s/p CABG, TIA, HTN, a fib, CHF, and DM.  Subjective: alert, friendly Assessment / Plan / Recommendation  CHL IP CLINICAL IMPRESSIONS 08/15/2017 Clinical Impression Pt presents with mild oral phase and functional pharyngeal phase for age. Pt with impaired mastication of peaches resulting in increased oral phase but no oral residue after swallow. Likely d/t edentulous status. Pt with functional pharyngeal phase consisting of swallow initiation at the valleculae with thin liquids via cup and straw (only 1 instance of mild flash penetration with thin liquids over many trials) and very trace amount of residue in valleculae. No aspiration was observed.  Recommend dysphagia 2 with thin liquids, may use straw, medicine whole with thin liquids and intermittent supervision.  SLP Visit Diagnosis Dysphagia, unspecified (R13.10) Attention and concentration deficit following -- Frontal lobe and executive function deficit following -- Impact on safety and function Mild aspiration risk   CHL IP TREATMENT RECOMMENDATION 08/15/2017 Treatment Recommendations Therapy as outlined in treatment plan below   Prognosis 08/11/2017 Prognosis for Safe Diet Advancement Good Barriers to Reach Goals -- Barriers/Prognosis Comment -- CHL IP DIET RECOMMENDATION 08/15/2017 SLP Diet Recommendations Dysphagia 2 (Fine chop) solids;Thin liquid Liquid Administration via Straw;Cup Medication Administration Whole meds with liquid Compensations Slow rate;Small sips/bites;Lingual sweep for clearance of pocketing;Minimize environmental distractions Postural Changes Seated upright at 90 degrees   CHL IP OTHER RECOMMENDATIONS 08/15/2017 Recommended Consults -- Oral Care Recommendations Oral care BID Other Recommendations --   CHL IP FOLLOW UP RECOMMENDATIONS 08/11/2017 Follow up Recommendations Inpatient Rehab   CHL IP FREQUENCY AND DURATION 08/11/2017 Speech Therapy Frequency (ACUTE ONLY) min 2x/week Treatment Duration 2 weeks      CHL IP ORAL PHASE 08/15/2017  Oral Phase Impaired Oral - Pudding Teaspoon -- Oral - Pudding Cup -- Oral - Honey Teaspoon -- Oral - Honey Cup -- Oral  - Nectar Teaspoon -- Oral - Nectar Cup WFL Oral - Nectar Straw -- Oral - Thin Teaspoon -- Oral - Thin Cup WFL Oral - Thin Straw WFL Oral - Puree -- Oral - Mech Soft Impaired mastication Oral - Regular -- Oral - Multi-Consistency -- Oral - Pill WFL Oral Phase - Comment --  CHL IP PHARYNGEAL PHASE 08/15/2017 Pharyngeal Phase Impaired Pharyngeal- Pudding Teaspoon -- Pharyngeal -- Pharyngeal- Pudding Cup -- Pharyngeal -- Pharyngeal- Honey Teaspoon -- Pharyngeal -- Pharyngeal- Honey Cup -- Pharyngeal -- Pharyngeal- Nectar Teaspoon -- Pharyngeal -- Pharyngeal- Nectar Cup WFL Pharyngeal -- Pharyngeal- Nectar Straw -- Pharyngeal -- Pharyngeal- Thin Teaspoon Delayed swallow initiation-vallecula;Pharyngeal residue - valleculae Pharyngeal -- Pharyngeal- Thin Cup Delayed swallow initiation-vallecula;Pharyngeal residue - valleculae Pharyngeal -- Pharyngeal- Thin Straw Delayed swallow initiation-vallecula;Pharyngeal residue - valleculae Pharyngeal -- Pharyngeal- Puree -- Pharyngeal -- Pharyngeal- Mechanical Soft -- Pharyngeal -- Pharyngeal- Regular -- Pharyngeal -- Pharyngeal- Multi-consistency Delayed swallow initiation-vallecula;Pharyngeal residue - valleculae Pharyngeal -- Pharyngeal- Pill Delayed swallow initiation-vallecula Pharyngeal -- Pharyngeal Comment --  CHL IP CERVICAL ESOPHAGEAL PHASE 08/15/2017 Cervical Esophageal Phase WFL Pudding Teaspoon -- Pudding Cup -- Honey Teaspoon -- Honey Cup -- Nectar Teaspoon -- Nectar Cup -- Nectar Straw -- Thin Teaspoon -- Thin Cup -- Thin Straw -- Puree -- Mechanical Soft -- Regular -- Multi-consistency -- Pill -- Cervical Esophageal Comment -- No flowsheet data found. [name] 08/15/2017, 10:26 AM              Recent Labs    08/14/17 0645  WBC 9.4  HGB 13.8  HCT 41.8  PLT 211   Recent Labs    08/14/17 0645  NA 142  K 4.9  CL 108  GLUCOSE 168*  BUN 27*  CREATININE 1.37*  CALCIUM 9.4   CBG (last 3)  Recent Labs    08/15/17 1633 08/15/17 2134 08/16/17 0644   GLUCAP 145* 81 108*    Wt Readings from Last 3 Encounters:  08/16/17 83 kg (182 lb 15.7 oz)  08/10/17 84.4 kg (186 lb)  07/18/17 87.1 kg (192 lb)    Physical Exam:  BP 119/62 (BP Location: Right Arm)   Pulse 86   Temp 98 F (36.7 C) (Oral)   Resp 18   Wt 83 kg (182 lb 15.7 oz)   SpO2 97%   BMI 32.41 kg/m  Constitutional: He appears well-developed and well-nourished.  NAD. HENT: Normocephalic.  Atraumatic. Eyes: EOMI.  No discharge.   Cardiovascular: RRR. No JVD. Respiratory: Effort normal.  Clear.  GI: He exhibits no distension. There is no tenderness.  Musculoskeletal: He exhibits no edema or tenderness. Neurological: He is alert and oriented 3 Follows full commands.  Fair awareness of deficits.  Motor: LUE 0/5 proximal to distal.  LLE 1/5 hip add, 0/5 distally (stable) Skin: Skin is warm and dry.  Psychiatric: He has a normal mood and affect. His behavior is normal.   Assessment/Plan: 1. 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 Physiatrist is providing close team supervision and 24 hour management of active medical problems listed below. Physiatrist and rehab team continue to assess barriers to discharge/monitor patient progress toward functional and medical goals.  Function:  Bathing Bathing position   Position: Sitting EOB  Bathing parts Body  parts bathed by patient: Chest, Abdomen, Right upper leg, Left upper leg, Front perineal area Body parts bathed by helper: Buttocks, Right lower leg, Left lower leg, Back, Right arm, Left arm  Bathing assist Assist Level: Touching or steadying assistance(Pt > 75%)      Upper Body Dressing/Undressing Upper body dressing   What is the patient wearing?: Pull over shirt/dress     Pull over shirt/dress - Perfomed by patient: Thread/unthread right sleeve, Put head through  opening Pull over shirt/dress - Perfomed by helper: Thread/unthread left sleeve, Pull shirt over trunk        Upper body assist Assist Level: (Mod A)      Lower Body Dressing/Undressing Lower body dressing   What is the patient wearing?: Pants, Non-skid slipper socks, Underwear   Underwear - Performed by helper: Thread/unthread right underwear leg, Thread/unthread left underwear leg, Pull underwear up/down   Pants- Performed by helper: Thread/unthread right pants leg, Thread/unthread left pants leg, Pull pants up/down   Non-skid slipper socks- Performed by helper: Don/doff right sock, Don/doff left sock                  Lower body assist Assist for lower body dressing: Touching or steadying assistance (Pt > 75%)      Toileting Toileting Toileting activity did not occur: No continent bowel/bladder event   Toileting steps completed by helper: Adjust clothing prior to toileting, Performs perineal hygiene, Adjust clothing after toileting    Toileting assist Assist level: Two helpers   Transfers Chair/bed transfer   Chair/bed transfer method: Squat pivot Chair/bed transfer assist level: Maximal assist (Pt 25 - 49%/lift and lower) Chair/bed transfer assistive device: Armrests     Locomotion Ambulation     Max distance: 30 Assist level: 2 helpers   Wheelchair   Type: Manual Max wheelchair distance: 100' Assist Level: Touching or steadying assistance (Pt > 75%)  Cognition Comprehension Comprehension assist level: Understands basic 75 - 89% of the time/ requires cueing 10 - 24% of the time  Expression Expression assist level: Expresses basic 75 - 89% of the time/requires cueing 10 - 24% of the time. Needs helper to occlude trach/needs to repeat words.  Social Interaction Social Interaction assist level: Interacts appropriately 75 - 89% of the time - Needs redirection for appropriate language or to initiate interaction.  Problem Solving Problem solving assist level:  Solves basic 75 - 89% of the time/requires cueing 10 - 24% of the time  Memory Memory assist level: Recognizes or recalls 75 - 89% of the time/requires cueing 10 - 24% of the time    Medical Problem List and Plan:  1. 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 Aspirin and Plavix x3 weeks then aspirin alone   Continue CIR PT, OT, Team conference today please see physician documentation under team conference tab, met with team face-to-face to discuss problems,progress, and goals. Formulized individual treatment plan based on medical history, underlying problem and comorbidities. 2. DVT Prophylaxis/Anticoagulation: Subcutaneous Lovenox  3. Pain Management: Tylenol as needed  4. Mood: Provide emotional support  5. Neuropsych: This patient is capable of making decisions on his own behalf.  6. Skin/Wound Care: Routine skin checks  7. Fluids/Electrolytes/Nutrition: Routine in and outs   BMP within acceptable range on 6/3 8. CAD with CABG. Continue aspirin and Plavix. No chest pain or shortness of breath  9. Diabetes mellitus. Hemoglobin A1c 6.0. SSI. Check blood sugars before meals and at  bedtime. Glucophage 1000 mg twice daily, Amaryl 4 mg daily, Lantus insulin 10 units nightly, Tradjenta 5 mg daily  CBG (last 3)  Recent Labs    08/15/17 1633 08/15/17 2134 08/16/17 0644  GLUCAP 145* 81 108*    CBG controlled 6/5 10. Dysphagia. Dysphasia #2 nectar liquids. Follow-up speech therapy  11. Hypertension. Cozaar 25 mg daily, Toprol 50 mg daily  Vitals:   08/15/17 1935 08/16/17 0542  BP: 120/69 119/62  Pulse: 81 86  Resp: 15 18  Temp: 98.1 F (36.7 C) 98 F (36.7 C)  SpO2: 98% 97%  Controlled 6/5 12. Diastolic congestive heart failure. Monitor for any signs of fluid overload. Patient was taking Lasix 40 mg daily if needed for fluid retention prior to admission.    Daily weights ordered Filed Weights   08/14/17  0602 08/15/17 0416 08/16/17 0542  Weight: 83.3 kg (183 lb 10.3 oz) 82.6 kg (182 lb 1.6 oz) 83 kg (182 lb 15.7 oz)   13. History of seizure disorder. Depakote 500 mg daily.  14. Hyperlipidemia. Lipitor  15. History of alcohol use. Monitor for withdrawal  16.  Elevated Creat need to monitor given pt on Metformin LOS (Days) 5 A FACE TO FACE EVALUATION WAS PERFORMED  Charlett Blake 08/16/2017 7:20 AM

## 2017-08-16 NOTE — Progress Notes (Signed)
Physical Therapy Session Note  Patient Details  Name: Fred [name] MRN: [id number] Date of Birth: [birth year]  Today's Date: 08/16/2017 PT Individual Time: 1330-1400 PT Individual Time Calculation (min): 30 min   Short Term Goals: Week 1:  PT Short Term Goal 1 (Week 1): Pt will perform squat pivot transfer to bed with mod assist  PT Short Term Goal 2 (Week 1): Pt will propell WC x 160ft with supervision assist  PT Short Term Goal 3 (Week 1): Pt will ambuate 51ft with max assist of 1.  PT Short Term Goal 4 (Week 1): Pt will maintian standing balance with mod assist and LRAD  PT Short Term Goal 5 (Week 1): pt will perform sit<>supine transfers with min assist from PT   Skilled Therapeutic Interventions/Progress Updates:    Pt received seated in recliner in room, agreeable to PT. No complaints of pain. Attempt squat pivot transfer recliner to w/c, unable to remove recliner armrest. Sit to stand max A to stedy. Stedy transfer recliner to w/c. Manual w/c propulsion x 100 ft with R UE/LE, min A for steering. Standing frame x 15 min while using RUE to reach to the R to encourage weight shift to the R due to pt pushing to the L. Use of mirror for visual feedback for upright posture. Pt is able to recognize he is leaning to the L with use of mirror, state what he needs to do to correct his posture, and weight shift to the R to correct. With onset of fatigue pt exhibits increased pushing to the L. Pt left seated in w/c in room with needs in reach and quick release belt in place, family present.  Therapy Documentation Precautions:  Precautions Precautions: Fall Precaution Comments: L sided weakness Restrictions Weight Bearing Restrictions: No   See Function Navigator for Current Functional Status.   Therapy/Group: Individual Therapy  Excell Seltzer, PT, DPT  08/16/2017, 4:39 PM

## 2017-08-16 NOTE — Plan of Care (Signed)
  Problem: Consults Goal: RH STROKE PATIENT EDUCATION Description See Patient Education module for education specifics  Outcome: Progressing   Problem: RH BOWEL ELIMINATION Goal: RH STG MANAGE BOWEL W/MEDICATION W/ASSISTANCE Description STG Manage Bowel with Medication with  Mod I Assistance.  Outcome: Progressing   Problem: RH BLADDER ELIMINATION Goal: RH STG MANAGE BLADDER WITH ASSISTANCE Description STG Manage Bladder With  Min Assistance  Outcome: Progressing   Problem: RH SAFETY Goal: RH STG ADHERE TO SAFETY PRECAUTIONS W/ASSISTANCE/DEVICE Description STG Adhere to Safety Precautions With cues/ supervision Assistance/Device.  Outcome: Progressing   Problem: RH KNOWLEDGE DEFICIT Goal: RH STG INCREASE KNOWLEDGE OF DIABETES Description Pt will be able to explain how to monitor and manage diabetes/diet/medications using notes/resources independently  Outcome: Progressing Goal: RH STG INCREASE KNOWLEDGE OF HYPERTENSION Description Pt will be able to demo understanding of HH/CMM diet and medications to manage HTN and state what to do to manage HTN and when to take medications  Outcome: Progressing Goal: RH STG INCREASE KNOWLEDGE OF DYSPHAGIA/FLUID INTAKE Description Pt and family will be able to explain and demo understanding of dysphagia restrictions and diet limitations due to dysphagia using cues/ resources/reminders  Outcome: Progressing

## 2017-08-16 NOTE — Progress Notes (Signed)
Speech Language Pathology Daily Session Note  Patient Details  Name: Fred [name] MRN: [id number] Date of Birth: 1935/09/10  Today's Date: 08/16/2017 SLP Individual Time: 1130-1200 SLP Individual Time Calculation (min): 30 min  Short Term Goals: Week 1: SLP Short Term Goal 1 (Week 1): Pt will demonstrate efficient mastication and oral clearance of dys 3 textured trials with min overt s/s aspiration to demonstrate readiness for solid diet advancement, SLP Short Term Goal 2 (Week 1): Pt will consume thin liquid trials with mod-min overt s/s aspiration in order to assess readiness for instrumental swallow assessment.  SLP Short Term Goal 3 (Week 1): Pt will participate in instrumental swallow assessment in order to determine least restrictive diet.  SLP Short Term Goal 4 (Week 1): Pt will utilize external memory aids to recall new, daily information with min A verbal cues.  SLP Short Term Goal 5 (Week 1): Pt will demonstrate functional problem solving for mildly complex tasks with mod A verbal cues. SLP Short Term Goal 6 (Week 1): Pt will self-monitor and correct functional errors in functional problem solving tasks with mod A verbal cues.  Skilled Therapeutic Interventions: Skilled treatment session focused on dysphagia goals. SLP facilitated session by providing intermittent supervision verbal cues for use of swallowing compensatory strategies (small bites/sips) with lunch meal of Dys. 2 textures with thin liquids. Patient consumed meal with overt cough X 1, suspect due to large, sequential sips. Recommend patient continue current diet. Patient left upright in wheelchair with family present. Continue with current plan of care.      Function:  Eating Eating     Eating Assist Level: Set up assist for;Supervision or verbal cues   Eating Set Up Assist For: Opening containers       Cognition Comprehension Comprehension assist level: Understands basic 90% of the time/cues < 10% of the time   Expression   Expression assist level: Expresses basic 90% of the time/requires cueing < 10% of the time.  Social Interaction Social Interaction assist level: Interacts appropriately 75 - 89% of the time - Needs redirection for appropriate language or to initiate interaction.  Problem Solving Problem solving assist level: Solves basic 75 - 89% of the time/requires cueing 10 - 24% of the time  Memory Memory assist level: Recognizes or recalls 75 - 89% of the time/requires cueing 10 - 24% of the time    Pain No/Denies Pain   Therapy/Group: Individual Therapy  Sheralee Qazi 08/16/2017, 3:35 PM

## 2017-08-16 NOTE — Progress Notes (Signed)
Physical Therapy Session Note  Patient Details  Name: Fred [name] MRN: [id number] Date of Birth: 08-10-1935  Today's Date: 08/16/2017 PT Individual Time: 0800-0859 AND 1400-1430 PT Individual Time Calculation (min): 59 min AND 30 min   Short Term Goals: Week 1:  PT Short Term Goal 1 (Week 1): Pt will perform squat pivot transfer to bed with mod assist  PT Short Term Goal 2 (Week 1): Pt will propell WC x 19ft with supervision assist  PT Short Term Goal 3 (Week 1): Pt will ambuate 22ft with max assist of 1.  PT Short Term Goal 4 (Week 1): Pt will maintian standing balance with mod assist and LRAD  PT Short Term Goal 5 (Week 1): pt will perform sit<>supine transfers with min assist from PT   Skilled Therapeutic Interventions/Progress Updates:    Session 1: Pt supine in bed upon PT arrival, agreeable to therapy tx and denies pain. Pt transferred from supine>sitting EOB with mod assist, verbal cues for techniques. Pt performed stand pivot transfer from bed>w/c using bedrail and max assist. Pt propelled w/c to they dayroom. Pt used standing frame this session x 3 min and x 4 min using mirror for visual feedback to maintain midline, manual facilitation for weightshift and hip extension, while standing pt completed peg board puzzle and pt performed R UE reaching task to reach for objects overhead on the R in order to correct L Lateral lean. Pt transported to gym. Pt performed x 3 sit<>stands at the rail with max assist, using mirror for visual feedback and wall on R side for tactile feedback in order to correct L lateral lean and touch wall with R shoulder, therapist providing tactile cues and blocking L knee while pt performs reaching task, max assist for standing balance. Pt transported back to room at end of session left seated in w/c with QRB in place and needs in reach.    Session 2: Pt seated in w/c upon PT arrival, agreeable to therapy tx and denies pain. Pt transported to the gym, pt  performed squat pivot transfer to the mat with max assist, verbal cues for techniques and manual facilitation for weightshift. Pt seated egde of mat worked on seated balance this session to perform reaching outside BOS, occasional LOB to the L requiring min to mod assist to correct. Pt performed 2 x 5 sit<>side propped on elbow working on core strength and L UE weightbearing. Pt performed squat pivot back to w/c with max assist, left in room with QRB and family member present.   Therapy Documentation Precautions:  Precautions Precautions: Fall Precaution Comments: L sided weakness Restrictions Weight Bearing Restrictions: No   See Function Navigator for Current Functional Status.   Therapy/Group: Individual Therapy  Netta Corrigan, PT, DPT 08/16/2017, 7:52 AM

## 2017-08-16 NOTE — Progress Notes (Signed)
Occupational Therapy Session Note  Patient Details  Name: Fred [name] MRN: [id number] Date of Birth: 10-11-1935  Today's Date: 08/16/2017 OT Individual Time: 1000-1100 OT Individual Time Calculation (min): 60 min    Short Term Goals: Week 1:  OT Short Term Goal 1 (Week 1): Pt will complete UB dressing using hemi-plegic technique with min A OT Short Term Goal 2 (Week 1): Pt will complete toilet transfers to [facility] with max A  OT Short Term Goal 3 (Week 1): Pt will complete shower transfer using grab bars with max A OT Short Term Goal 4 (Week 1): Pt will complete stand at sink for up to 1 minute with mod A  OT Short Term Goal 5 (Week 1): Pt will complete UB washing with min A on tub bench   Skilled Therapeutic Interventions/Progress Updates:    Pt greeted seated in wc and agreeable to OT treatment session. Bathing/dressing completed wc level at the sink. Hand over hand A to integrate L UE into bathing tasks for neuro re-ed. Multiple sit<>stands using Stedy with max A. Focus on trunk control when sitting in [place] utilizing mirror feedback to decrease pushing and lateral lean to the L. Worked on hemi-dressing techniques with pt able to follow commands on how to thread LLE into shirt. Pt required assistance to thread LLE, then worked on pt reaching to thread R LE, but pt unable to maintain trunk control to lift R LE into pants requiring max A to thread B LEs. Pt left seated in wc at end of session with safety belt on and needs met.  Therapy Documentation Precautions:  Precautions Precautions: Fall Precaution Comments: L sided weakness Restrictions Weight Bearing Restrictions: No Pain: Pain Assessment Pain Scale: 0-10 Pain Score: 0-No pain ADL: ADL ADL Comments: see functional navigator  See Function Navigator for Current Functional Status.   Therapy/Group: Individual Therapy  Valma Cava 08/16/2017, 10:51 AM

## 2017-08-17 ENCOUNTER — Inpatient Hospital Stay (HOSPITAL_COMMUNITY): Payer: Medicare Other | Admitting: Physical Therapy

## 2017-08-17 ENCOUNTER — Inpatient Hospital Stay (HOSPITAL_COMMUNITY): Payer: Medicare Other | Admitting: Occupational Therapy

## 2017-08-17 ENCOUNTER — Inpatient Hospital Stay (HOSPITAL_COMMUNITY): Payer: Medicare Other | Admitting: Speech Pathology

## 2017-08-17 LAB — GLUCOSE, CAPILLARY
GLUCOSE-CAPILLARY: 108 mg/dL — AB (ref 65–99)
Glucose-Capillary: 98 mg/dL (ref 65–99)
Glucose-Capillary: 170 mg/dL — ABNORMAL HIGH (ref 65–99)
Glucose-Capillary: 142 mg/dL — ABNORMAL HIGH (ref 65–99)

## 2017-08-17 MED ORDER — BISACODYL 10 MG RE SUPP
10.0000 mg | Freq: Every day | RECTAL | Status: DC | PRN
  Administered 2017-08-17 – 2017-09-11 (×2): 10 mg via RECTAL
  Filled 2017-08-17 (×2): qty 1

## 2017-08-17 NOTE — Progress Notes (Signed)
Occupational Therapy Session Note  Patient Details  Name: Fred [name] MRN: [id number] Date of Birth: [birth year]  Today's Date: 08/17/2017 OT Individual Time: 1100-1200 OT Individual Time Calculation (min): 60 min    Short Term Goals: Week 1:  OT Short Term Goal 1 (Week 1): Pt will complete UB dressing using hemi-plegic technique with min A OT Short Term Goal 2 (Week 1): Pt will complete toilet transfers to [facility] with max A  OT Short Term Goal 3 (Week 1): Pt will complete shower transfer using grab bars with max A OT Short Term Goal 4 (Week 1): Pt will complete stand at sink for up to 1 minute with mod A  OT Short Term Goal 5 (Week 1): Pt will complete UB washing with min A on tub bench   Skilled Therapeutic Interventions/Progress Updates:    Pt greeted semi-reclined in bed and agreeable to OT treatment session. Pt came to sitting EOB with max A to move LLE and max A to elevate trunk. Sit<>stand in [place] with max A. Bathing completed seated in Stedy at the sink with focus on trunk stability. Utilized mirror feedback to bring pt back to midline with lateral LOB to the L-overall min A for sitting balance. Multiple sit<>stands from raised height of Stedy with min A and facilitation for full trunk extension. UB dressing completed sitting in wc with min verbal cues to recall hemi-UB dressing techniques learned yesterday. Pt needed assistance to thread LLE into pants, then demonstrated stronger core today to lift R LE while reaching forward to try to thread pant leg. Pt still needed assistance for R LE threading. Sit<>stand at the sink without Stedy with OT blocking LLE and pt pulling up on sink. Pt brought to therapy gym for L UE e-stim.  Applied 1:1 NMES to CH1 supraspinatus and middle deltoid to help approximate shoulder joint, and Ch 2 wrist extensors.  Ratio 1:1 Rate 35 pps Waveform- Asymmetric Ramp 1.0 Pulse 300 CH1 Intensity- 18  Duration -  15  CH 2 Intensity- 20 Duration -   23  Pt returned to room at end of session and left seated in wc with safety belt on and prepared for lunch.     Therapy Documentation Precautions:  Precautions Precautions: Fall Precaution Comments: L sided weakness Restrictions Weight Bearing Restrictions: No Pan: Pain Assessment Pain Score: 0-No pain ADL: ADL ADL Comments: see functional navigator  See Function Navigator for Current Functional Status.   Therapy/Group: Individual Therapy  Valma Cava 08/17/2017, 11:49 AM

## 2017-08-17 NOTE — Progress Notes (Signed)
Physical Therapy Session Note  Patient Details  Name: Fred [name] MRN: [id number] Date of Birth: 12-16-1935  Today's Date: 08/17/2017 PT Individual Time: 5697-9480 PT Individual Time Calculation (min): 70 min   Short Term Goals: Week 1:  PT Short Term Goal 1 (Week 1): Pt will perform squat pivot transfer to bed with mod assist  PT Short Term Goal 2 (Week 1): Pt will propell WC x 170f with supervision assist  PT Short Term Goal 3 (Week 1): Pt will ambuate 147fwith max assist of 1.  PT Short Term Goal 4 (Week 1): Pt will maintian standing balance with mod assist and LRAD  PT Short Term Goal 5 (Week 1): pt will perform sit<>supine transfers with min assist from PT   Skilled Therapeutic Interventions/Progress Updates:   Pt in recliner and agreeable to therapy, denies pain. Performed squat pivot transfer to w/c, max-total assist. Worked on blocked practice of sit<>stands at rail in hallway w/ visual feedback for midline and posture via mirror. Performed 10-12 in total w/ mod-max assist using R rail for boosting, manual facilitation of L quad activation during transitioning to stand. Worked on LLE NMR and pregait tasks in standing including lateral weight shifting, R step forward and backward, and L TKE. Max manual facilitation of L quad activation during tasks, very trace quad activation w/ tactile cues. Performed NuStep 6 min and 4 min @ level 2 for reciprocal movement pattern and L quad activation. Returned to room and transferred to EOB via same technique as above. Ended session in supine, call bell within reach and all needs met.   Therapy Documentation Precautions:  Precautions Precautions: Fall Precaution Comments: L sided weakness Restrictions Weight Bearing Restrictions: No Vital Signs: Therapy Vitals Temp: 98.8 F (37.1 C) Temp Source: Oral Pulse Rate: 74 Resp: 18 BP: 127/67 Patient Position (if appropriate): Sitting Oxygen Therapy SpO2: 100 % O2 Device: Room Air  See  Function Navigator for Current Functional Status.   Therapy/Group: Individual Therapy  Melville Engen K Arnette 08/17/2017, 3:23 PM

## 2017-08-17 NOTE — Progress Notes (Signed)
Speech Language Pathology Daily Session Note  Patient Details  Name: Fred [name] MRN: [id number] Date of Birth: 01-24-1936  Today's Date: 08/17/2017 SLP Individual Time: 0715-0815 SLP Individual Time Calculation (min): 60 min  Short Term Goals: Week 1: SLP Short Term Goal 1 (Week 1): Pt will demonstrate efficient mastication and oral clearance of dys 3 textured trials with min overt s/s aspiration to demonstrate readiness for solid diet advancement, SLP Short Term Goal 2 (Week 1): Pt will consume thin liquid trials with mod-min overt s/s aspiration in order to assess readiness for instrumental swallow assessment.  SLP Short Term Goal 3 (Week 1): Pt will participate in instrumental swallow assessment in order to determine least restrictive diet.  SLP Short Term Goal 4 (Week 1): Pt will utilize external memory aids to recall new, daily information with min A verbal cues.  SLP Short Term Goal 5 (Week 1): Pt will demonstrate functional problem solving for mildly complex tasks with mod A verbal cues. SLP Short Term Goal 6 (Week 1): Pt will self-monitor and correct functional errors in functional problem solving tasks with mod A verbal cues.  Skilled Therapeutic Interventions: Skilled treatment session focused on dysphagia goals. SLP facilitated session by providing skilled observation with breakfast meal of Dys. 2 textures with thin liquids. Patient demonstrated intermittent cough throughout meal, however, patient with significant coughing prior to initiation of self-feeding. Therefore, difficult to differentiate. Patient also consumed trials of Dys. 3 textures with efficient mastication and complete oral clearance. Recommend trial tray prior to upgrade. Patient's daughter present and educated in regards to patient's current swallowing function, diet recommendations and appropriate textures. She verbalized understanding and agreement. Patient left upright in bed with daughter present. Continue with  current plan of care.      Function:  Eating Eating   Modified Consistency Diet: Yes Eating Assist Level: Set up assist for;Supervision or verbal cues   Eating Set Up Assist For: Opening containers       Cognition Comprehension Comprehension assist level: Understands basic 90% of the time/cues < 10% of the time  Expression   Expression assist level: Expresses basic 90% of the time/requires cueing < 10% of the time.  Social Interaction Social Interaction assist level: Interacts appropriately 75 - 89% of the time - Needs redirection for appropriate language or to initiate interaction.  Problem Solving Problem solving assist level: Solves basic 75 - 89% of the time/requires cueing 10 - 24% of the time  Memory Memory assist level: Recognizes or recalls 75 - 89% of the time/requires cueing 10 - 24% of the time    Pain No/Denies Pain   Therapy/Group: Individual Therapy  Bernardette Waldron 08/17/2017, 3:00 PM

## 2017-08-17 NOTE — Progress Notes (Signed)
[facility] PHYSICAL MEDICINE & REHABILITATION     PROGRESS NOTE  Subjective/Complaints:   No issues overnite, remains constipated but no abd pain  ROS:  Denies CP, S OB, nausea, vomiting, diarrhea.  Objective: Vital Signs: Blood pressure 126/68, pulse 91, temperature 98.4 F (36.9 C), temperature source Oral, resp. rate 16, weight 82.6 kg (182 lb), SpO2 93 %. Dg Swallowing Func-speech Pathology  Result Date: 08/15/2017 Objective Swallowing Evaluation: Type of Study: MBS-Modified Barium Swallow Study  Patient Details Name: PEDROHENRIQUE MCCONVILLE MRN: [id number] Date of Birth: [birth year] Today's Date: 08/15/2017 Time: SLP Start Time (ACUTE ONLY): 1012 -SLP Stop Time (ACUTE ONLY): 1026 SLP Time Calculation (min) (ACUTE ONLY): 14 min Past Medical History: Past Medical History: Diagnosis Date . Atrial fibrillation ([place])   Post operative . CAD (coronary artery disease)   s/p cabg . CHF (congestive heart failure) ([place])  . Diverticulosis  . DJD (degenerative joint disease)  . DM (diabetes mellitus) ([place])  . History of anemia  . History of stroke  . Hyperlipidemia  . Hypertension  . 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 Peripheral vascular disease ([place])  . Polyneuropathy, diabetic ([place])  . Seizure disorder, complex partial ([place])  . Syncope and collapse  Past Surgical History: Past Surgical History: Procedure Laterality Date . CATARACT EXTRACTION W/ INTRAOCULAR LENS  IMPLANT, BILATERAL  06/2013  Dr. [name] . CORONARY ARTERY BYPASS GRAFT  02/13/2004  4 vesel . removal of sebaceous cyst from neck  08/2007  Dr. [name] . TONSILLECTOMY   HPI: Pt is an 82 y/o male admitted secondary to L sided weakness. Imaging revealed acute/early subacute infarct in the R posterior limb of the internal capsule. PMH includes CAD s/p CABG, TIA, HTN, a fib, CHF, and DM.  Subjective: alert, friendly Assessment / Plan / Recommendation CHL IP CLINICAL IMPRESSIONS 08/15/2017 Clinical Impression Pt presents with mild oral phase  and functional pharyngeal phase for age. Pt with impaired mastication of peaches resulting in increased oral phase but no oral residue after swallow. Likely d/t edentulous status. Pt with functional pharyngeal phase consisting of swallow initiation at the valleculae with thin liquids via cup and straw (only 1 instance of mild flash penetration with thin liquids over many trials) and very trace amount of residue in valleculae. No aspiration was observed.  Recommend dysphagia 2 with thin liquids, may use straw, medicine whole with thin liquids and intermittent supervision.  SLP Visit Diagnosis Dysphagia, unspecified (R13.10) Attention and concentration deficit following -- Frontal lobe and executive function deficit following -- Impact on safety and function Mild aspiration risk   CHL IP TREATMENT RECOMMENDATION 08/15/2017 Treatment Recommendations Therapy as outlined in treatment plan below   Prognosis 08/11/2017 Prognosis for Safe Diet Advancement Good Barriers to Reach Goals -- Barriers/Prognosis Comment -- CHL IP DIET RECOMMENDATION 08/15/2017 SLP Diet Recommendations Dysphagia 2 (Fine chop) solids;Thin liquid Liquid Administration via Straw;Cup Medication Administration Whole meds with liquid Compensations Slow rate;Small sips/bites;Lingual sweep for clearance of pocketing;Minimize environmental distractions Postural Changes Seated upright at 90 degrees   CHL IP OTHER RECOMMENDATIONS 08/15/2017 Recommended Consults -- Oral Care Recommendations Oral care BID Other Recommendations --   CHL IP FOLLOW UP RECOMMENDATIONS 08/11/2017 Follow up Recommendations Inpatient Rehab   CHL IP FREQUENCY AND DURATION 08/11/2017 Speech Therapy Frequency (ACUTE ONLY) min 2x/week Treatment Duration 2 weeks      CHL IP ORAL PHASE 08/15/2017 Oral Phase Impaired Oral - Pudding Teaspoon -- Oral - Pudding Cup -- Oral - Honey Teaspoon -- Oral -  Honey Cup -- Oral - Nectar Teaspoon -- Oral - Nectar Cup WFL Oral - Nectar Straw -- Oral - Thin Teaspoon --  Oral - Thin Cup WFL Oral - Thin Straw WFL Oral - Puree -- Oral - Mech Soft Impaired mastication Oral - Regular -- Oral - Multi-Consistency -- Oral - Pill WFL Oral Phase - Comment --  CHL IP PHARYNGEAL PHASE 08/15/2017 Pharyngeal Phase Impaired Pharyngeal- Pudding Teaspoon -- Pharyngeal -- Pharyngeal- Pudding Cup -- Pharyngeal -- Pharyngeal- Honey Teaspoon -- Pharyngeal -- Pharyngeal- Honey Cup -- Pharyngeal -- Pharyngeal- Nectar Teaspoon -- Pharyngeal -- Pharyngeal- Nectar Cup WFL Pharyngeal -- Pharyngeal- Nectar Straw -- Pharyngeal -- Pharyngeal- Thin Teaspoon Delayed swallow initiation-vallecula;Pharyngeal residue - valleculae Pharyngeal -- Pharyngeal- Thin Cup Delayed swallow initiation-vallecula;Pharyngeal residue - valleculae Pharyngeal -- Pharyngeal- Thin Straw Delayed swallow initiation-vallecula;Pharyngeal residue - valleculae Pharyngeal -- Pharyngeal- Puree -- Pharyngeal -- Pharyngeal- Mechanical Soft -- Pharyngeal -- Pharyngeal- Regular -- Pharyngeal -- Pharyngeal- Multi-consistency Delayed swallow initiation-vallecula;Pharyngeal residue - valleculae Pharyngeal -- Pharyngeal- Pill Delayed swallow initiation-vallecula Pharyngeal -- Pharyngeal Comment --  CHL IP CERVICAL ESOPHAGEAL PHASE 08/15/2017 Cervical Esophageal Phase WFL Pudding Teaspoon -- Pudding Cup -- Honey Teaspoon -- Honey Cup -- Nectar Teaspoon -- Nectar Cup -- Nectar Straw -- Thin Teaspoon -- Thin Cup -- Thin Straw -- Puree -- Mechanical Soft -- Regular -- Multi-consistency -- Pill -- Cervical Esophageal Comment -- No flowsheet data found. [name] 08/15/2017, 10:26 AM              No results for input(s): WBC, HGB, HCT, PLT in the last 72 hours. No results for input(s): NA, K, CL, GLUCOSE, BUN, CREATININE, CALCIUM in the last 72 hours.  Invalid input(s): CO CBG (last 3)  Recent Labs    08/16/17 1643 08/16/17 2138 08/17/17 0658  GLUCAP 144* 101* 98    Wt Readings from Last 3 Encounters:  08/17/17 82.6 kg (182 lb)  08/10/17 84.4  kg (186 lb)  07/18/17 87.1 kg (192 lb)    Physical Exam:  BP 126/68 (BP Location: Right Arm)   Pulse 91   Temp 98.4 F (36.9 C) (Oral)   Resp 16   Wt 82.6 kg (182 lb)   SpO2 93%   BMI 32.24 kg/m  Constitutional: He appears well-developed and well-nourished.  NAD. HENT: Normocephalic.  Atraumatic. Eyes: EOMI.  No discharge.   Cardiovascular: RRR. No JVD. Respiratory: Effort normal.  Clear.  GI: He exhibits no distension. There is no tenderness.  Musculoskeletal: He exhibits no edema or tenderness. Neurological: He is alert and oriented 3 Follows full commands.  Fair awareness of deficits.  Motor: LUE 0/5 proximal to distal.  LLE 1/5 hip add, 0/5 distally (stable) Skin: Skin is warm and dry.  Psychiatric: He has a normal mood and affect. His behavior is normal.   Assessment/Plan: 1. 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 Physiatrist is providing close team supervision and 24 hour management of active medical problems listed below. Physiatrist and rehab team continue to assess barriers to discharge/monitor patient progress toward functional and medical goals.  Function:  Bathing Bathing position   Position: Wheelchair/chair at sink  Bathing parts Body parts bathed by patient: Chest, Abdomen, Right upper leg, Left upper leg, Front perineal area, Left arm Body parts bathed by helper: Right arm, Buttocks, Left lower leg, Right lower leg, Back  Bathing assist Assist Level: Touching or steadying assistance(Pt >  75%)      Upper Body Dressing/Undressing Upper body dressing   What is the patient wearing?: Pull over shirt/dress     Pull over shirt/dress - Perfomed by patient: Thread/unthread right sleeve Pull over shirt/dress - Perfomed by helper: Thread/unthread left sleeve, Pull shirt over trunk, Put head through opening         Upper body assist Assist Level: Touching or steadying assistance(Pt > 75%)      Lower Body Dressing/Undressing Lower body dressing   What is the patient wearing?: Pants, Socks, Shoes   Underwear - Performed by helper: Thread/unthread right underwear leg, Thread/unthread left underwear leg, Pull underwear up/down   Pants- Performed by helper: Thread/unthread right pants leg, Pull pants up/down, Thread/unthread left pants leg   Non-skid slipper socks- Performed by helper: Don/doff right sock, Don/doff left sock   Socks - Performed by helper: Don/doff left sock, Don/doff right sock   Shoes - Performed by helper: Don/doff right shoe, Don/doff left shoe          Lower body assist Assist for lower body dressing: Touching or steadying assistance (Pt > 75%)      Toileting Toileting Toileting activity did not occur: No continent bowel/bladder event   Toileting steps completed by helper: Adjust clothing prior to toileting, Performs perineal hygiene, Adjust clothing after toileting    Toileting assist Assist level: Two helpers   Transfers Chair/bed transfer   Chair/bed transfer method: Other(stedy) Chair/bed transfer assist level: dependent (Pt equals 0%) Chair/bed transfer assistive device: Mechanical lift Mechanical lift: Ecologist     Max distance: 30 Assist level: 2 helpers   Wheelchair   Type: Manual Max wheelchair distance: 100' Assist Level: Touching or steadying assistance (Pt > 75%)  Cognition Comprehension Comprehension assist level: Understands basic 90% of the time/cues < 10% of the time  Expression Expression assist level: Expresses basic 90% of the time/requires cueing < 10% of the time.  Social Interaction Social Interaction assist level: Interacts appropriately 75 - 89% of the time - Needs redirection for appropriate language or to initiate interaction.  Problem Solving Problem solving assist level: Solves basic 75 - 89% of the  time/requires cueing 10 - 24% of the time  Memory Memory assist level: Recognizes or recalls 75 - 89% of the time/requires cueing 10 - 24% of the time    Medical Problem List and Plan:  1. 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 Aspirin and Plavix x3 weeks then aspirin alone   Continue CIR PT, OT,discussed tent d/c 6/28  2. DVT Prophylaxis/Anticoagulation: Subcutaneous Lovenox  3. Pain Management: Tylenol as needed  4. Mood: Provide emotional support  5. Neuropsych: This patient is capable of making decisions on his own behalf.  6. Skin/Wound Care: Routine skin checks  7. Fluids/Electrolytes/Nutrition: Routine in and outs   BMP within acceptable range on 6/3 8. CAD with CABG. Continue aspirin and Plavix. No chest pain or shortness of breath  9. Diabetes mellitus. Hemoglobin A1c 6.0. SSI. Check blood sugars before meals and at bedtime. Glucophage 1000 mg twice daily, Amaryl 4 mg daily, Lantus insulin 10 units nightly, Tradjenta 5 mg daily  CBG (last 3)  Recent Labs    08/16/17 1643 08/16/17 2138 08/17/17 0658  GLUCAP 144* 101* 98    CBG controlled 6/6 10. Dysphagia. Dysphasia #2 nectar liquids. Follow-up speech therapy  11. Hypertension. Cozaar 25 mg daily, Toprol 50 mg daily  Vitals:  08/16/17 2020 08/17/17 0419  BP: (!) 160/69 126/68  Pulse: 76 91  Resp: 18 16  Temp: 97.9 F (36.6 C) 98.4 F (36.9 C)  SpO2: 100% 93%  Controlled 6/6 ,  Elevated last pm-monitor 12. Diastolic congestive heart failure. Monitor for any signs of fluid overload. Patient was taking Lasix 40 mg daily if needed for fluid retention prior to admission.    Daily weights ordered Filed Weights   08/15/17 0416 08/16/17 0542 08/17/17 0419  Weight: 82.6 kg (182 lb 1.6 oz) 83 kg (182 lb 15.7 oz) 82.6 kg (182 lb)   13. History of seizure disorder. Depakote 500 mg daily.  14. Hyperlipidemia. Lipitor  15. History of alcohol use.  Monitor for withdrawal  16.  Elevated Creat need to monitor given pt on Metformin 17.  Constipation senna BID, add Bisacodyl supp prn LOS (Days) 6 A FACE TO FACE EVALUATION WAS PERFORMED  Charlett Blake 08/17/2017 7:21 AM

## 2017-08-18 ENCOUNTER — Inpatient Hospital Stay (HOSPITAL_COMMUNITY): Payer: Medicare Other | Admitting: Physical Therapy

## 2017-08-18 ENCOUNTER — Inpatient Hospital Stay (HOSPITAL_COMMUNITY): Payer: Medicare Other | Admitting: Occupational Therapy

## 2017-08-18 ENCOUNTER — Inpatient Hospital Stay (HOSPITAL_COMMUNITY): Payer: Medicare Other | Admitting: Speech Pathology

## 2017-08-18 LAB — CREATININE, SERUM
CREATININE: 1.25 mg/dL — AB (ref 0.61–1.24)
GFR calc Af Amer: >60 mL/min (ref >60)
GFR calc non Af Amer: 53 mL/min — ABNORMAL LOW (ref >60)

## 2017-08-18 LAB — GLUCOSE, CAPILLARY
GLUCOSE-CAPILLARY: 125 mg/dL — AB (ref 65–99)
Glucose-Capillary: 81 mg/dL (ref 65–99)
Glucose-Capillary: 40 mg/dL — CL (ref 65–99)
Glucose-Capillary: 127 mg/dL — ABNORMAL HIGH (ref 65–99)
Glucose-Capillary: 94 mg/dL (ref 65–99)

## 2017-08-18 MED ORDER — SENNOSIDES-DOCUSATE SODIUM 8.6-50 MG PO TABS
1.0000 | ORAL_TABLET | Freq: Two times a day (BID) | ORAL | Status: DC
  Administered 2017-08-19 – 2017-09-13 (×50): 1 via ORAL
  Filled 2017-08-18 (×51): qty 1

## 2017-08-18 NOTE — Progress Notes (Signed)
Social Work Patient ID: Fred [name], male   DOB: October 21, 1935, 82 y.o.   MRN: [id number]     Fred [name], [name]  Social Worker    Patient Care Conference  Signed  Date of Service:  08/18/2017 12:45 AM          Signed          Show:Clear all [x] Manual[x] Template[] Copied  Added by: [x] Madylin Fairbank, Gerline Legacy, LCSW   [] Hover for details   Inpatient RehabilitationTeam Conference and Plan of Care Update Date: 08/16/2017   Time: 11:00 AM      Patient Name: Fred [name]      Medical Record Number: [id number]  Date of Birth: [birth year] Sex: Male         Room/Bed: 4W14C/4W14C-01 Payor Info: Payor: MEDICARE / Plan: MEDICARE PART A AND B / Product Type: *No Product type* /     Admitting Diagnosis: CVA  Admit Date/Time:  08/11/2017  5:19 PM Admission Comments: No comment available    Primary Diagnosis:  <principal problem not specified> Principal Problem: <principal problem not specified>       Patient Active Problem List    Diagnosis Date Noted  . Diabetes mellitus type 2 in obese ([place])    . Small vessel disease ([place]) 08/11/2017  . Left hemiparesis ([place])    . Benign essential hypertension with delivery    . CVA (cerebral vascular accident) ([place]) 08/10/2017  . 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 of localized onset, not intractable, without status epilepticus ([place]) 06/20/2017  . Primary osteoarthritis of both knees 05/11/2017  . Stroke (cerebrum) ([place]) 03/21/2017  . Type 2 diabetes mellitus with complication, with long-term current use of insulin ([place]) 03/27/2015  . Atherosclerosis of native coronary artery of native heart without angina pectoris 03/27/2015  . Palpitations 11/04/2014  . S/P CABG (coronary artery bypass graft) 11/04/2014  . Atrial fibrillation, unspecified    . History of stroke 09/04/2014  . Obesity (BMI 30-39.9) 11/29/2013  . History of gout 05/16/2013  . Congestive heart failure ([place]) 04/20/2009  .  DYSPNEA 02/11/2009  . CARDIOVASCULAR FUNCTION STUDY, ABNORMAL 01/29/2009  . NECK PAIN 08/03/2007  . ANEMIA / OTHER 05/16/2007  . Coronary atherosclerosis 05/16/2007  . PAROXYSMAL ATRIAL FIBRILLATION 05/16/2007  . 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 Hyperlipidemia 03/30/2007  . DEGENERATIVE JOINT DISEASE 03/30/2007  . PAROTID LESION, UNSPECIFIED 09/29/2006  . Partial epilepsy with impairment of consciousness ([place]) 09/29/2006  . Diabetic polyneuropathy ([place]) 09/29/2006  . Essential hypertension 09/29/2006  . PERIPHERAL VASCULAR DISEASE 09/29/2006  . DIVERTICULOSIS 09/29/2006      Expected Discharge Date: Expected Discharge Date: 09/08/17   Team Members Present: Physician leading conference: Dr. Alysia Penna Social Worker Present: Alfonse Alpers, LCSW Nurse Present: Other (comment)(Katlynn Dara Lords, [facility]) PT Present: Michaelene Song, PT OT Present: Cherylynn Ridges, OT SLP Present: Weston Anna, SLP PPS Coordinator present : Daiva Nakayama, RN, CRRN       Current Status/Progress Goal Weekly Team Focus  Medical     Vital signs have remained stable, labs show some mild dehydration, need to encourage fluids  Maintain medical stability during rehabilitation stay  Improve fluid intake   Bowel/Bladder     continent/incontinent at times at night  will be continent of b/b with mod assist LBM 06/02  toilet pt Q2 hr and prn   Swallow/Nutrition/ Hydration     Dys. 2 textures with thin liquids, intermittent supervision   Mod I  tolerance of liquid upgrade, trials of Dys.  3 textures    ADL's     Max/total A for BADLs-max A transfers  Min A  L NMR, NMES, modified bathing/dressing, transfer training, sit<>stand   Mobility     Max assist overall, dense L hemi  Min assist overall, gait short distance  transfers, sit<>stands, LLE NMR   Communication               Safety/Cognition/ Behavioral Observations   Min-Supervision  Supervision-Mod I  Complex problem solving, recall    Pain       pt denies any pain  pain <=2/10  assess pain Q shift and prn   Skin     skin intact and free of breakdown  skin will remian free of breakdown and intact  assess skin q shift and prn     Rehab Goals Patient on target to meet rehab goals: Yes Rehab Goals Revised: none - pt's first conference *See Care Plan and progress notes for long and short-term goals.      Barriers to Discharge   Current Status/Progress Possible Resolutions Date Resolved   Physician     Medical stability;Other (comments)  Severe hemiparesis  Progressing towards goal  Continue rehab      Nursing                 PT  Inaccessible home environment;Home environment access/layout  2 steps to enter home              OT                 SLP            SW              Discharge Planning/Teaching Needs:  Pt plans to return to his home with his wife.  Family members plans to pull together to be with pt/wife 24/7, with pt needing min A.  Family education to occur closer to d/c.   Team Discussion:  Pt is doing well cognitively per Dr. Letta Pate.  SLP to work on memory and problem solving and advancing his diet form D2.  Pt has had some incontinence, but is asking for the urinal.  Pt does not like to be woken at night for toileting, but will awake himself, as needed.  Pt is using stedy for sit to stand and is max to total for LB ADLs; has decent carryover for drrssing.  Pt is max A to total for ADLs with min A goals.  Fatigues easily.  Pt is min to mod A for sit to stand and txs are max A.  Pt still with little to no muscle activation in his L leg.  Pt has a L lateral lean with pushing.  Revisions to Treatment Plan:  none    Continued Need for Acute Rehabilitation Level of Care: The patient requires daily medical management by a physician with specialized training in physical medicine and rehabilitation for the following conditions: Daily direction of a multidisciplinary physical rehabilitation program to ensure safe  treatment while eliciting the highest outcome that is of practical value to the patient.: Yes Daily medical management of patient stability for increased activity during participation in an intensive rehabilitation regime.: Yes Daily analysis of laboratory values and/or radiology reports with any subsequent need for medication adjustment of medical intervention for : Neurological problems;Diabetes problems;Blood pressure problems   Ambyr Qadri, Silvestre Mesi 08/18/2017, 12:46 AM

## 2017-08-18 NOTE — Progress Notes (Signed)
Speech Language Pathology Weekly Progress and Session Note  Patient Details  Name: Fred [name] MRN: [id number] Date of Birth: Apr 04, 1935  Beginning of progress report period: Aug 11, 2017 End of progress report period: August 18, 2017  Today's Date: 08/18/2017 SLP Individual Time: 1130-1200 SLP Individual Time Calculation (min): 30 min  Short Term Goals: Week 1: SLP Short Term Goal 1 (Week 1): Pt will demonstrate efficient mastication and oral clearance of dys 3 textured trials with min overt s/s aspiration to demonstrate readiness for solid diet advancement, SLP Short Term Goal 1 - Progress (Week 1): Met SLP Short Term Goal 2 (Week 1): Pt will consume thin liquid trials with mod-min overt s/s aspiration in order to assess readiness for instrumental swallow assessment.  SLP Short Term Goal 2 - Progress (Week 1): Met SLP Short Term Goal 3 (Week 1): Pt will participate in instrumental swallow assessment in order to determine least restrictive diet.  SLP Short Term Goal 3 - Progress (Week 1): Met SLP Short Term Goal 4 (Week 1): Pt will utilize external memory aids to recall new, daily information with min A verbal cues.  SLP Short Term Goal 4 - Progress (Week 1): Met SLP Short Term Goal 5 (Week 1): Pt will demonstrate functional problem solving for mildly complex tasks with mod A verbal cues. SLP Short Term Goal 5 - Progress (Week 1): Met SLP Short Term Goal 6 (Week 1): Pt will self-monitor and correct functional errors in functional problem solving tasks with mod A verbal cues. SLP Short Term Goal 6 - Progress (Week 1): Met    New Short Term Goals: Week 2: SLP Short Term Goal 1 (Week 2): Pt will demonstrate functional problem solving for mildly complex tasks with min A verbal cues. SLP Short Term Goal 2 (Week 2): Pt will utilize external memory aids to recall new, daily information with supervision verbal cues.  SLP Short Term Goal 3 (Week 2): Pt will consume current diet with minimal  overt s/s of aspiration with Mod I for use of swallowing compensatory strategies.  SLP Short Term Goal 4 (Week 2): Pt will demonstrate efficient mastication and oral clearance with trials of regular textures with minimal overt s/s aspiration over 2 sessions with Mod I to demonstrate readiness for solid diet advancement,  Weekly Progress Updates: Patient has made functional gains and has met 6 of 6 STG's this reporting period. Currently, patient is consuming Dys. 3 textures with thin liquids with minimal overt s/s of aspiration with overall Mod I for use of compensatory strategies. Patient requires overall Min A verbal cues for emergent awareness and recall and Min-Mod A for complex problem solving. Patient and family education is ongoing. Patient would benefit from continued skilled SLP intervention to maximize his swallowing and cognitive function prior to discharge.      Intensity: Minumum of 1-2 x/day, 30 to 90 minutes Frequency: 3 to 5 out of 7 days Duration/Length of Stay: 09/08/17 Treatment/Interventions: Cueing hierarchy;Dysphagia/aspiration precaution training;Cognitive remediation/compensation;Functional tasks;Patient/family education;Therapeutic Activities;Internal/external aids;Environmental controls   Daily Session  Skilled Therapeutic Interventions: Skilled treatment session focused on dysphagia goals. Patient consumed upgraded lunch meal of Dys. 3 textures with thin liquids without overt s/s of aspiration and complete oral clearance with Mod I. Therefore, recommend patient upgrade to Dys. 3 textures. Patient left upright in wheelchair with family present. Continue with current plan of care.       Function:   Eating Eating   Modified Consistency Diet: Yes Eating Assist Level: Set up  assist for;Supervision or verbal cues   Eating Set Up Assist For: Opening containers       Cognition Comprehension Comprehension assist level: Understands basic 90% of the time/cues < 10% of the  time  Expression   Expression assist level: Expresses basic 90% of the time/requires cueing < 10% of the time.  Social Interaction Social Interaction assist level: Interacts appropriately 75 - 89% of the time - Needs redirection for appropriate language or to initiate interaction.  Problem Solving Problem solving assist level: Solves basic 90% of the time/requires cueing < 10% of the time  Memory Memory assist level: Recognizes or recalls 90% of the time/requires cueing < 10% of the time   Pain Pain Assessment Pain Score: 0-No pain  Therapy/Group: Individual Therapy  Fred [name] 08/18/2017, 12:35 PM

## 2017-08-18 NOTE — Progress Notes (Signed)
[place] PHYSICAL MEDICINE & REHABILITATION     PROGRESS NOTE  Subjective/Complaints:   Grand daughter at bedside  ROS:  Denies CP, S OB, nausea, vomiting, diarrhea.  Objective: Vital Signs: Blood pressure 96/80, pulse 79, temperature 98.5 F (36.9 C), temperature source Oral, resp. rate 18, weight 85.1 kg (187 lb 9.8 oz), SpO2 100 %. No results found. No results for input(s): WBC, HGB, HCT, PLT in the last 72 hours. No results for input(s): NA, K, CL, GLUCOSE, BUN, CREATININE, CALCIUM in the last 72 hours.  Invalid input(s): CO CBG (last 3)  Recent Labs    08/17/17 1613 08/17/17 2139 08/18/17 0651  GLUCAP 108* 142* 81    Wt Readings from Last 3 Encounters:  08/18/17 85.1 kg (187 lb 9.8 oz)  08/10/17 84.4 kg (186 lb)  07/18/17 87.1 kg (192 lb)    Physical Exam:  BP 96/80 (BP Location: Right Arm)   Pulse 79   Temp 98.5 F (36.9 C) (Oral)   Resp 18   Wt 85.1 kg (187 lb 9.8 oz)   SpO2 100%   BMI 33.23 kg/m  Constitutional: He appears well-developed and well-nourished.  NAD. HENT: Normocephalic.  Atraumatic. Eyes: EOMI.  No discharge.   Cardiovascular: RRR. No JVD. Respiratory: Effort normal.  Clear.  GI: He exhibits no distension. There is no tenderness.  Musculoskeletal: He exhibits no edema or tenderness. Neurological: He is alert and oriented 3 Follows full commands.  Fair awareness of deficits.  Motor: LUE 0/5 proximal to distal.  LLE 1/5 hip add, 0/5 distally (stable) Skin: Skin is warm and dry.  Psychiatric: He has a normal mood and affect. His behavior is normal.   Assessment/Plan: 1. 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 Physiatrist is providing close team supervision and 24 hour management of active medical problems listed below. Physiatrist and rehab team continue to assess barriers to  discharge/monitor patient progress toward functional and medical goals.  Function:  Bathing Bathing position   Position: Wheelchair/chair at sink(Stedy)  Bathing parts Body parts bathed by patient: Chest, Abdomen, Right upper leg, Left upper leg, Front perineal area, Left arm Body parts bathed by helper: Right arm, Buttocks, Left lower leg, Right lower leg, Back  Bathing assist Assist Level: Touching or steadying assistance(Pt > 75%)      Upper Body Dressing/Undressing Upper body dressing   What is the patient wearing?: Pull over shirt/dress     Pull over shirt/dress - Perfomed by patient: Thread/unthread right sleeve, Put head through opening Pull over shirt/dress - Perfomed by helper: Thread/unthread left sleeve, Pull shirt over trunk        Upper body assist Assist Level: Touching or steadying assistance(Pt > 75%)      Lower Body Dressing/Undressing Lower body dressing   What is the patient wearing?: Pants   Underwear - Performed by helper: Thread/unthread right underwear leg, Thread/unthread left underwear leg, Pull underwear up/down   Pants- Performed by helper: Pull pants up/down, Thread/unthread right pants leg, Thread/unthread left pants leg   Non-skid slipper socks- Performed by helper: Don/doff right sock, Don/doff left sock   Socks - Performed by helper: Don/doff left sock, Don/doff right sock   Shoes - Performed by helper: Don/doff right shoe, Don/doff left shoe          Lower body assist Assist for lower body dressing: Touching or steadying assistance (Pt > 75%)  Toileting Toileting Toileting activity did not occur: No continent bowel/bladder event   Toileting steps completed by helper: Adjust clothing prior to toileting, Performs perineal hygiene, Adjust clothing after toileting    Toileting assist Assist level: Two helpers   Transfers Chair/bed transfer   Chair/bed transfer method: Squat pivot Chair/bed transfer assist level: Total assist (Pt  < 25%) Chair/bed transfer assistive device: Armrests Mechanical lift: Ecologist     Max distance: 30 Assist level: 2 helpers   Wheelchair   Type: Manual Max wheelchair distance: 100' Assist Level: Touching or steadying assistance (Pt > 75%)  Cognition Comprehension Comprehension assist level: Understands basic 90% of the time/cues < 10% of the time  Expression Expression assist level: Expresses basic 90% of the time/requires cueing < 10% of the time.  Social Interaction Social Interaction assist level: Interacts appropriately 75 - 89% of the time - Needs redirection for appropriate language or to initiate interaction.  Problem Solving Problem solving assist level: Solves basic 75 - 89% of the time/requires cueing 10 - 24% of the time  Memory Memory assist level: Recognizes or recalls 75 - 89% of the time/requires cueing 10 - 24% of the time    Medical Problem List and Plan:  1. 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 Aspirin and Plavix x3 weeks then aspirin alone   Continue CIR PT, OT, tent d/c 6/28  2. DVT Prophylaxis/Anticoagulation: Subcutaneous Lovenox  3. Pain Management: Tylenol as needed  4. Mood: Provide emotional support  5. Neuropsych: This patient is capable of making decisions on his own behalf.  6. Skin/Wound Care: Routine skin checks  7. Fluids/Electrolytes/Nutrition: Routine in and outs   BMP within acceptable range on 6/3 8. CAD with CABG. Continue aspirin and Plavix. No chest pain or shortness of breath  9. Diabetes mellitus. Hemoglobin A1c 6.0. SSI. Check blood sugars before meals and at bedtime. Glucophage 1000 mg twice daily, Amaryl 4 mg daily, Lantus insulin 10 units nightly, Tradjenta 5 mg daily  CBG (last 3)  Recent Labs    08/17/17 1613 08/17/17 2139 08/18/17 0651  GLUCAP 108* 142* 81    CBG controlled 6/7 10. Dysphagia. Dysphasia #2 nectar liquids.  Follow-up speech therapy  11. Hypertension. Cozaar 25 mg daily, Toprol 50 mg daily  Vitals:   08/17/17 2107 08/18/17 0649  BP: (!) 141/83 96/80  Pulse: 86 79  Resp: 18 18  Temp: 98.4 F (36.9 C) 98.5 F (36.9 C)  SpO2: 96% 100%  Controlled 6/7 ,  Elevated last pm-but on low side this am, no med changes 12. Diastolic congestive heart failure. Monitor for any signs of fluid overload. Patient was taking Lasix 40 mg daily if needed for fluid retention prior to admission.    Daily weights ordered Filed Weights   08/16/17 0542 08/17/17 0419 08/18/17 0617  Weight: 83 kg (182 lb 15.7 oz) 82.6 kg (182 lb) 85.1 kg (187 lb 9.8 oz)   13. History of seizure disorder. Depakote 500 mg daily.  14. Hyperlipidemia. Lipitor  15. History of alcohol use. Monitor for withdrawal  16.  Elevated Creat need to monitor given pt on Metformin 17.  Constipation-improved, reduce senna to 1po BID, add Bisacodyl supp prn LOS (Days) 7 A FACE TO FACE EVALUATION WAS PERFORMED  Charlett Blake 08/18/2017 7:05 AM

## 2017-08-18 NOTE — Significant Event (Signed)
Hypoglycemic Event  CBG: 40  Treatment: 15 GM carbohydrate snack  Symptoms: None  Follow-up CBG: Time:1744 CBG Result:127  Possible Reasons for Event: Inadequate meal intake  Comments/MD notified:CBG 81 this am, Amaryl gvn. Minimal breakfast and approx half of lunch eaten according to family    Fred [name]

## 2017-08-18 NOTE — Progress Notes (Signed)
Social Work Patient ID: Fred [name], male   DOB: 08-13-1935, 82 y.o.   MRN: [id number]   CSW met with pt and his son-in-law 08-16-17 to update them on team conference discussion and targeted d/c date of 09-08-17.  Pt was accepting of this and he and son-in-law stated that they would share this news with the rest of the family.  Pt remains emotional at times when thinking about his wife.  He understands this is due to the stroke.  No current concerns/needs/questions.  CSW will continue to follow and assist as needed.

## 2017-08-18 NOTE — Progress Notes (Signed)
Occupational Therapy Session Note  Patient Details  Name: Fred [name] MRN: [id number] Date of Birth: [birth year]  Today's Date: 08/18/2017 OT Individual Time: 1478-2956 OT Individual Time Calculation (min): 32 min    Short Term Goals: Week 1:  OT Short Term Goal 1 (Week 1): Pt will complete UB dressing using hemi-plegic technique with min A OT Short Term Goal 2 (Week 1): Pt will complete toilet transfers to [facility] with max A  OT Short Term Goal 3 (Week 1): Pt will complete shower transfer using grab bars with max A OT Short Term Goal 4 (Week 1): Pt will complete stand at sink for up to 1 minute with mod A  OT Short Term Goal 5 (Week 1): Pt will complete UB washing with min A on tub bench   Skilled Therapeutic Interventions/Progress Updates:    Treatment session with focus on LUE NMR with NMES.  Engaged in towel glides with focus on horizontal abduction/adduction with pt able to elicit slight adduction/internal rotation which increased with tapping at biceps muscle belly.  Applied NMES to triceps for 10 mins with use of e-stim to facilitate horizontal abduction/external rotation while continuing to allow pt to activate horizontal adduction/internal rotation.  Therapist provided support at elbow to decrease gravity on movement.  Pt tolerated e-stim with no c/o pain or adverse reaction.    Therapy Documentation Precautions:  Precautions Precautions: Fall Precaution Comments: L sided weakness Restrictions Weight Bearing Restrictions: No Pain:  Pt with no c/o pain  See Function Navigator for Current Functional Status.   Therapy/Group: Individual Therapy  Ariann Khaimov, [place] 08/18/2017, 3:00 PM

## 2017-08-18 NOTE — Patient Care Conference (Signed)
**Note Fred-Identified via Obfuscation** Inpatient RehabilitationTeam Conference and Plan of Care Update Date: 08/16/2017   Time: 11:00 AM    Patient Name: Fred [name]      Medical Record Number: [id number]  Date of Birth: 1935-11-17 Sex: Male         Room/Bed: 4W14C/4W14C-01 Payor Info: Payor: MEDICARE / Plan: MEDICARE PART A AND B / Product Type: *No Product type* /    Admitting Diagnosis: CVA  Admit Date/Time:  08/11/2017  5:19 PM Admission Comments: No comment available   Primary Diagnosis:  <principal problem not specified> Principal Problem: <principal problem not specified>  Patient Active Problem List   Diagnosis Date Noted  . Diabetes mellitus type 2 in obese ([place])   . Small vessel disease ([place]) 08/11/2017  . Left hemiparesis ([place])   . Benign essential hypertension with delivery   . CVA (cerebral vascular accident) ([place]) 08/10/2017  . 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 of localized onset, not intractable, without status epilepticus ([place]) 06/20/2017  . Primary osteoarthritis of both knees 05/11/2017  . Stroke (cerebrum) ([place]) 03/21/2017  . Type 2 diabetes mellitus with complication, with long-term current use of insulin ([place]) 03/27/2015  . Atherosclerosis of native coronary artery of native heart without angina pectoris 03/27/2015  . Palpitations 11/04/2014  . S/P CABG (coronary artery bypass graft) 11/04/2014  . Atrial fibrillation, unspecified   . History of stroke 09/04/2014  . Obesity (BMI 30-39.9) 11/29/2013  . History of gout 05/16/2013  . Congestive heart failure ([place]) 04/20/2009  . DYSPNEA 02/11/2009  . CARDIOVASCULAR FUNCTION STUDY, ABNORMAL 01/29/2009  . NECK PAIN 08/03/2007  . ANEMIA / OTHER 05/16/2007  . Coronary atherosclerosis 05/16/2007  . PAROXYSMAL ATRIAL FIBRILLATION 05/16/2007  . 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 Hyperlipidemia 03/30/2007  . DEGENERATIVE JOINT DISEASE 03/30/2007  . PAROTID LESION, UNSPECIFIED 09/29/2006  . Partial  epilepsy with impairment of consciousness ([place]) 09/29/2006  . Diabetic polyneuropathy ([place]) 09/29/2006  . Essential hypertension 09/29/2006  . PERIPHERAL VASCULAR DISEASE 09/29/2006  . DIVERTICULOSIS 09/29/2006    Expected Discharge Date: Expected Discharge Date: 09/08/17  Team Members Present: Physician leading conference: Dr. Alysia Penna Social Worker Present: Alfonse Alpers, LCSW Nurse Present: Other (comment)(Katlynn [place], [facility]) PT Present: Michaelene Song, PT OT Present: Cherylynn Ridges, OT SLP Present: Weston Anna, SLP PPS Coordinator present : Daiva Nakayama, RN, CRRN     Current Status/Progress Goal Weekly Team Focus  Medical   Vital signs have remained stable, labs show some mild dehydration, need to encourage fluids  Maintain medical stability during rehabilitation stay  Improve fluid intake   Bowel/Bladder   continent/incontinent at times at night  will be continent of b/b with mod assist LBM 06/02  toilet pt Q2 hr and prn   Swallow/Nutrition/ Hydration   Dys. 2 textures with thin liquids, intermittent supervision   Mod I  tolerance of liquid upgrade, trials of Dys. 3 textures    ADL's   Max/total A for BADLs-max A transfers  Min A  L NMR, NMES, modified bathing/dressing, transfer training, sit<>stand   Mobility   Max assist overall, dense L hemi  Min assist overall, gait short distance  transfers, sit<>stands, LLE NMR   Communication             Safety/Cognition/ Behavioral Observations  Min-Supervision  Supervision-Mod I  Complex problem solving, recall    Pain   pt denies any pain  pain <=2/10  assess pain Q shift and prn   Skin   skin intact and free  of breakdown  skin will remian free of breakdown and intact  assess skin q shift and prn    Rehab Goals Patient on target to meet rehab goals: Yes Rehab Goals Revised: none - pt's first conference *See Care Plan and progress notes for long and short-term goals.     Barriers to Discharge  Current  Status/Progress Possible Resolutions Date Resolved   Physician    Medical stability;Other (comments)  Severe hemiparesis  Progressing towards goal  Continue rehab      Nursing                  PT  Inaccessible home environment;Home environment access/layout  2 steps to enter home              OT                  SLP                SW                Discharge Planning/Teaching Needs:  Pt plans to return to his home with his wife.  Family members plans to pull together to be with pt/wife 24/7, with pt needing min A.  Family education to occur closer to d/c.   Team Discussion:  Pt is doing well cognitively per Dr. Letta Pate.  SLP to work on memory and problem solving and advancing his diet form D2.  Pt has had some incontinence, but is asking for the urinal.  Pt does not like to be woken at night for toileting, but will awake himself, as needed.  Pt is using stedy for sit to stand and is max to total for LB ADLs; has decent carryover for drrssing.  Pt is max A to total for ADLs with min A goals.  Fatigues easily.  Pt is min to mod A for sit to stand and txs are max A.  Pt still with little to no muscle activation in his L leg.  Pt has a L lateral lean with pushing.  Revisions to Treatment Plan:  none    Continued Need for Acute Rehabilitation Level of Care: The patient requires daily medical management by a physician with specialized training in physical medicine and rehabilitation for the following conditions: Daily direction of a multidisciplinary physical rehabilitation program to ensure safe treatment while eliciting the highest outcome that is of practical value to the patient.: Yes Daily medical management of patient stability for increased activity during participation in an intensive rehabilitation regime.: Yes Daily analysis of laboratory values and/or radiology reports with any subsequent need for medication adjustment of medical intervention for : Neurological problems;Diabetes  problems;Blood pressure problems  Renu Asby, Silvestre Mesi 08/18/2017, 12:46 AM

## 2017-08-18 NOTE — Progress Notes (Signed)
Physical Therapy Session Note  Patient Details  Name: Fred [name] MRN: [id number] Date of Birth: 1935/09/17  Today's Date: 08/18/2017 PT Individual Time: 1007-1219 PT Individual Time Calculation (min): 70 min   Short Term Goals: Week 1:  PT Short Term Goal 1 (Week 1): Pt will perform squat pivot transfer to bed with mod assist  PT Short Term Goal 2 (Week 1): Pt will propell WC x 1109f with supervision assist  PT Short Term Goal 3 (Week 1): Pt will ambuate 132fwith max assist of 1.  PT Short Term Goal 4 (Week 1): Pt will maintian standing balance with mod assist and LRAD  PT Short Term Goal 5 (Week 1): pt will perform sit<>supine transfers with min assist from PT   Skilled Therapeutic Interventions/Progress Updates:   Pt in w/c and agreeable to therapy, denies pain. Total assist w/c transport to gym. Transferred to edge of mat via squat pivot w/ max assist. Worked on postural control, sitting balance, and righting reactions at edge of mat w/ bean bag reaching task emphasizing anterior weight shifting, trunk rotation, and trunk side bending. Mod assist to correct multiple LOB to both R and L sides, tactile facilitation of lateral trunk musculature engagement on both hemi and non-hemi sides. Transitioned to R sidelying and worked on LLE muscle activation in gravity eliminated position. Movements included hip flexion, hip extension, and knee extension. Trace muscle activation in all 3 movements, and able to repeat motion 5-10 times before becoming fatigued and needing to switch to another movement. Worked on sit<>stands at rail in hallway w/ manual facilitation of L quad activation and tactile cues for posture and L hip extension. Posture in standing required min guard overall, improved from previous sessions w/ this therapist. Performed 5-6 stands in all while performing pregait tasks including lateral weight shifts and RLE forward and backward stepping. Pt self-propelled w/c back to room via R  hemi technique, ended session in w/c and in care of family, all needs met.   Therapy Documentation Precautions:  Precautions Precautions: Fall Precaution Comments: L sided weakness Restrictions Weight Bearing Restrictions: No Vital Signs: Therapy Vitals Pulse Rate: 75 Resp: 18 BP: (!) 110/52 Patient Position (if appropriate): Sitting Oxygen Therapy SpO2: 96 % O2 Device: Room Air  See Function Navigator for Current Functional Status.   Therapy/Group: Individual Therapy  [name] 08/18/2017, 4:06 PM

## 2017-08-18 NOTE — Progress Notes (Signed)
Occupational Therapy Session Note  Patient Details  Name: Fred [name] MRN: [id number] Date of Birth: Nov 11, 1935  Today's Date: 08/18/2017 OT Individual Time: 1002-1100 OT Individual Time Calculation (min): 58 min    Short Term Goals: Week 1:  OT Short Term Goal 1 (Week 1): Pt will complete UB dressing using hemi-plegic technique with min A OT Short Term Goal 2 (Week 1): Pt will complete toilet transfers to [facility] with max A  OT Short Term Goal 3 (Week 1): Pt will complete shower transfer using grab bars with max A OT Short Term Goal 4 (Week 1): Pt will complete stand at sink for up to 1 minute with mod A  OT Short Term Goal 5 (Week 1): Pt will complete UB washing with min A on tub bench   Skilled Therapeutic Interventions/Progress Updates:    Pt greeted semi-reclined in bed and agreeable to OT treatment session. Bathing completed sitting in [place] with focus on trunk control, trunk extension, and midline orientation. Utilized Pharmacist, hospital to correct lateral lean to the L. Facilitation to achieve full hip/trunk extension with standing in [place]. Pt unable to remove R UE from stedy to assist with washing buttocks in standing. HOH A to integrate L UE into bathing tasks for neuro re-ed. Pt needed instructional cues to recall hemi UB dressing techniques today, but followed directions well and was able to eventually thread LUE into shirt. Applied 1:1 NMES to CH1 supraspinatus and middle deltoid to help approximate shoulder joint, and Ch 2 wrist extensors.  Ratio 1:1 Rate 35 pps Waveform- Asymmetric Ramp 1.0 Pulse 300 CH1 Intensity- 18  Duration -  15  CH2 Intensity- 23 Duration -  15  Pt returned to room and left seated in wc with safety belt on and family present.   Therapy Documentation Precautions:  Precautions Precautions: Fall Precaution Comments: L sided weakness Restrictions Weight Bearing Restrictions: No Pain: Pain Assessment Pain Score: 0-No pain ADL: ADL ADL  Comments: see functional navigator  See Function Navigator for Current Functional Status.   Therapy/Group: Individual Therapy  Valma Cava 08/18/2017, 10:51 AM

## 2017-08-19 ENCOUNTER — Inpatient Hospital Stay (HOSPITAL_COMMUNITY): Payer: Medicare Other | Admitting: Physical Therapy

## 2017-08-19 ENCOUNTER — Inpatient Hospital Stay (HOSPITAL_COMMUNITY): Payer: Medicare Other | Admitting: *Deleted

## 2017-08-19 LAB — GLUCOSE, CAPILLARY
GLUCOSE-CAPILLARY: 132 mg/dL — AB (ref 65–99)
GLUCOSE-CAPILLARY: 83 mg/dL (ref 65–99)
Glucose-Capillary: 139 mg/dL — ABNORMAL HIGH (ref 65–99)
Glucose-Capillary: 111 mg/dL — ABNORMAL HIGH (ref 65–99)

## 2017-08-19 NOTE — Progress Notes (Signed)
[place] PHYSICAL MEDICINE & REHABILITATION     PROGRESS NOTE  Subjective/Complaints:   Patient lying in bed.  States that he slept fairly well and has no new complaints.  Did have hypoglycemic events yesterday afternoon  ROS: Patient denies fever, rash, sore throat, blurred vision, nausea, vomiting, diarrhea, cough, shortness of breath or chest pain, joint or back pain, headache, or mood change.   Objective: Vital Signs: Blood pressure 121/67, pulse 74, temperature 98 F (36.7 C), temperature source Oral, resp. rate 18, weight 83.5 kg (184 lb 1.4 oz), SpO2 99 %. No results found. No results for input(s): WBC, HGB, HCT, PLT in the last 72 hours. Recent Labs    08/18/17 0734  CREATININE 1.25*   CBG (last 3)  Recent Labs    08/18/17 1744 08/18/17 2102 08/19/17 0736  GLUCAP 127* 94 132*    Wt Readings from Last 3 Encounters:  08/19/17 83.5 kg (184 lb 1.4 oz)  08/10/17 84.4 kg (186 lb)  07/18/17 87.1 kg (192 lb)    Physical Exam:  BP 121/67 (BP Location: Right Arm)   Pulse 74   Temp 98 F (36.7 C) (Oral)   Resp 18   Wt 83.5 kg (184 lb 1.4 oz)   SpO2 99%   BMI 32.61 kg/m  Constitutional: No distress . Vital signs reviewed. HEENT: EOMI, oral membranes moist Neck: supple Cardiovascular: RRR without murmur. No JVD    Respiratory: CTA Bilaterally without wheezes or rales. Normal effort    GI: BS +, non-tender, non-distended  Musculoskeletal: He exhibits no edema or tenderness. Neurological: He is alert and oriented 3 Follows full commands.  Fair awareness of deficits.  Motor: LUE 0/5 proximal to distal.  LLE 1/5 hip add, 0/5 distally (stable exam) no resting tone Skin: Skin is warm and dry.  Psychiatric: He has a normal mood and affect. His behavior is normal.   Assessment/Plan: 1. 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 Physiatrist is providing close team supervision and 24 hour management of active medical problems listed below. Physiatrist and rehab team continue to assess barriers to discharge/monitor patient progress toward functional and medical goals.  Function:  Bathing Bathing position   Position: Wheelchair/chair at sink(Stedy)  Bathing parts Body parts bathed by patient: Chest, Abdomen, Right upper leg, Left upper leg, Front perineal area, Left arm Body parts bathed by helper: Right arm, Buttocks, Left lower leg, Right lower leg, Back  Bathing assist Assist Level: Touching or steadying assistance(Pt > 75%)      Upper Body Dressing/Undressing Upper body dressing   What is the patient wearing?: Pull over shirt/dress     Pull over shirt/dress - Perfomed by patient: Thread/unthread right sleeve, Put head through opening Pull over shirt/dress - Perfomed by helper: Thread/unthread left sleeve, Pull shirt over trunk        Upper body assist Assist Level: Touching or steadying assistance(Pt > 75%)      Lower Body Dressing/Undressing Lower body dressing   What is the patient wearing?: Pants   Underwear - Performed by helper: Thread/unthread right underwear leg, Thread/unthread left underwear leg, Pull underwear up/down   Pants- Performed by helper: Pull pants up/down, Thread/unthread right pants leg, Thread/unthread left pants leg   Non-skid slipper socks- Performed by helper: Don/doff right sock, Don/doff left sock   Socks - Performed by helper: Don/doff left sock, Don/doff right sock  Shoes - Performed by helper: Don/doff right shoe, Don/doff left shoe          Lower body assist Assist for lower body dressing: Touching or steadying assistance (Pt > 75%)      Toileting Toileting Toileting activity did not occur: No continent bowel/bladder event   Toileting steps completed by helper: Adjust clothing prior to toileting, Performs perineal hygiene, Adjust clothing after  toileting    Toileting assist Assist level: Two helpers   Transfers Chair/bed transfer   Chair/bed transfer method: Squat pivot Chair/bed transfer assist level: Total assist (Pt < 25%) Chair/bed transfer assistive device: Armrests Mechanical lift: Ecologist     Max distance: 30 Assist level: 2 helpers   Wheelchair   Type: Manual Max wheelchair distance: 100' Assist Level: Touching or steadying assistance (Pt > 75%)  Cognition Comprehension Comprehension assist level: Understands basic 90% of the time/cues < 10% of the time  Expression Expression assist level: Expresses basic 90% of the time/requires cueing < 10% of the time.  Social Interaction Social Interaction assist level: Interacts appropriately 75 - 89% of the time - Needs redirection for appropriate language or to initiate interaction.  Problem Solving Problem solving assist level: Solves basic 90% of the time/requires cueing < 10% of the time  Memory Memory assist level: Recognizes or recalls 90% of the time/requires cueing < 10% of the time    Medical Problem List and Plan:  1. 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 Aspirin and Plavix x3 weeks then aspirin alone   Continue CIR PT, OT, tent d/c 6/28   2. DVT Prophylaxis/Anticoagulation: Subcutaneous Lovenox  3. Pain Management: Tylenol as needed  4. Mood: Provide emotional support  5. Neuropsych: This patient is capable of making decisions on his own behalf.  6. Skin/Wound Care: Routine skin checks  7. Fluids/Electrolytes/Nutrition: Routine in and outs   BMP within acceptable range on 6/3 8. CAD with CABG. Continue aspirin and Plavix. No chest pain or shortness of breath  9. Diabetes mellitus. Hemoglobin A1c 6.0. SSI. Check blood sugars before meals and at bedtime. Glucophage 1000 mg twice daily, Amaryl 4 mg daily, Lantus insulin 10 units nightly, Tradjenta 5 mg daily   CBG (last 3)  Recent Labs    08/18/17 1744 08/18/17 2102 08/19/17 0736  GLUCAP 127* 94 132*   -Hypoglycemic yesterday likely related to his decreased p.o. Intake  -Nursing and team need to reiterate the importance of regular p.o. intake as it pertains to management of his blood sugars 10. Dysphagia. Dysphasia #2 nectar liquids. Follow-up speech therapy  11. Hypertension. Cozaar 25 mg daily, Toprol 50 mg daily  Vitals:   08/18/17 1927 08/19/17 0655  BP: 113/66 121/67  Pulse: 77 74  Resp: 18 18  Temp: 98 F (36.7 C) 98 F (36.7 C)  SpO2: 99% 99%  Controlled 6/7 ,  Elevated last pm-but on low side this am, no med changes 12. Diastolic congestive heart failure. Monitor for any signs of fluid overload. Patient was taking Lasix 40 mg daily if needed for fluid retention prior to admission.    Daily weights ordered--stable Filed Weights   08/17/17 0419 08/18/17 0617 08/19/17 0703  Weight: 82.6 kg (182 lb) 85.1 kg (187 lb 9.8 oz) 83.5 kg (184 lb 1.4 oz)   13. History of seizure disorder. Depakote 500 mg daily.  14. Hyperlipidemia. Lipitor  15. History of alcohol use. Monitor for withdrawal  16.  Elevated Creat need to monitor given pt on Metformin 17.  Constipation-improved, reduced senna to 1po BID, add Bisacodyl supp prn LOS (Days) 8 A FACE TO FACE EVALUATION WAS PERFORMED  Meredith Staggers 08/19/2017 8:38 AM

## 2017-08-19 NOTE — Progress Notes (Signed)
Physical Therapy Session Note  Patient Details  Name: Fred [name] MRN: [id number] Date of Birth: 1935-07-10  Today's Date: 08/19/2017 PT Individual Time: 1010-1110 PT Individual Time Calculation (min): 60 min   Short Term Goals: Week 1:  PT Short Term Goal 1 (Week 1): Pt will perform squat pivot transfer to bed with mod assist  PT Short Term Goal 2 (Week 1): Pt will propell WC x 186ft with supervision assist  PT Short Term Goal 3 (Week 1): Pt will ambuate 66ft with max assist of 1.  PT Short Term Goal 4 (Week 1): Pt will maintian standing balance with mod assist and LRAD  PT Short Term Goal 5 (Week 1): pt will perform sit<>supine transfers with min assist from PT   Skilled Therapeutic Interventions/Progress Updates:  Pt up in [facility], ready to go.  Pt propelled WC with min A for steering in busy environment using hemi-technique. Applied basic back for improved postural control. Pt engaged in unsupported sitting edge of WC 3x68min with hands on lap for increased midline support and trunk control. Pt instructed in reciprocal scooting in WC and edge of mat for trunk control and NMR.  Squat-pivot transfer WC<>mat with Max A and multi-modal cues including demo for technique. Pt has difficulty clearing hips and anteriorly translating over BOS with midline.  Edge of mat NMR for lateral leans to bil elbows with head turns to increase challenge each x5 with assist for LUE management and shoulder support. Unsupported sitting reaching balance to R outside BOS for improved return to midline with mirror for feedback.  Sit<>stands edge of mat and at // bars for standing balance and midline. PT continued to demonstrate heavy L lean in standing. Max A for static stand,. Standing frame 1x12 min with mirror to improve sustained midline/R lean with R UE bead sorting task to reduce RUE reliance.  Mini sit<> stands in harness for increased dynamic challenge in supported environment.  Pt left up in [facility] with lap belt  and family presents.        Therapy Documentation Precautions:  Precautions Precautions: Fall Precaution Comments: L sided weakness Restrictions Weight Bearing Restrictions: No General:  Pain: none    See Function Navigator for Current Functional Status.   Therapy/Group: Individual Therapy Kennieth Rad, PT, DPT  Dwaine Deter, Landry Mellow M 08/19/2017, 10:58 AM

## 2017-08-19 NOTE — Progress Notes (Signed)
Physical Therapy Weekly Progress Note  Patient Details  Name: Fred [name] MRN: [id number] Date of Birth: Dec 29, 1935  Beginning of progress report period: August 12, 2017 End of progress report period: August 19, 2017  Today's Date: 08/19/2017 PT Individual Time: 0800-0900 PT Individual Time Calculation (min): 60 min   Patient has met 2 of 5 short term goals. Pt is making steady progress towards long term goals of min assist. He continues to require max assist w/ bed mobility, transfers, and gait. However, pt does demonstrates improved postural control and decreased pushing towards L side during functional mobility. Coordination has improved, allowing him to self-propel w/c w/ min assist to supervision using R hemi technique. Family members are present during most therapy sessions and continue to be a source of motivation and encouragement for patient.  Patient continues to demonstrate the following deficits muscle weakness, decreased cardiorespiratoy endurance, impaired timing and sequencing, unbalanced muscle activation, decreased coordination and decreased motor planning, decreased problem solving, decreased safety awareness, decreased memory and delayed processing and decreased sitting balance, decreased standing balance, decreased postural control, hemiplegia and decreased balance strategies and therefore will continue to benefit from skilled PT intervention to increase functional independence with mobility.  Patient progressing toward long term goals..  Continue plan of care.  PT Short Term Goals Week 1:  PT Short Term Goal 1 (Week 1): Pt will perform squat pivot transfer to bed with mod assist  PT Short Term Goal 1 - Progress (Week 1): Progressing toward goal PT Short Term Goal 2 (Week 1): Pt will propell WC x 154f with supervision assist  PT Short Term Goal 2 - Progress (Week 1): Met PT Short Term Goal 3 (Week 1): Pt will ambuate 121fwith max assist of 1.  PT Short Term Goal 3 -  Progress (Week 1): Met PT Short Term Goal 4 (Week 1): Pt will maintian standing balance with mod assist and LRAD  PT Short Term Goal 4 - Progress (Week 1): Progressing toward goal PT Short Term Goal 5 (Week 1): pt will perform sit<>supine transfers with min assist from PT  PT Short Term Goal 5 - Progress (Week 1): Progressing toward goal Week 2:  PT Short Term Goal 1 (Week 2): Pt will transfer bed<>chair w/ mod assist PT Short Term Goal 2 (Week 2): Pt will demonstrate trace L quad activation during functional mobility 25% of the time PT Short Term Goal 3 (Week 2): Pt will ambulate 15' w/ mod assist  PT Short Term Goal 4 (Week 2): Pt will maintain dynamic sitting balance w/ min assist   Skilled Therapeutic Interventions/Progress Updates:   Pt in supine and agreeable to therapy, RN present providing pain medications. Transferred to EOB w/ min assist and worked on static and dynamic sitting balance during clothes management. Pt required min-mod assist to maintain dynamic sitting balance while therapist provided mod assist to change shirt. Performed multiple sit<>stands w/ mod assist using bedrails for support, total assist for LE garment management in standing w/ max assist to block L knee as well. Total assist to don shoes for time management. Provided set-up assist at sink while pt utilized RUE to shave face and brush hair.   Pt self-propelled w/c to therapy gym to work on independence w/ locomotion, performed 150' using R hemi technique. Worked on gait at rail, ambulated 30' w/ max assist x1. 2nd helper present for safety. Pt required mod-max manual assist for lateral weight shifting, total assist for LLE management, and frequent verbal and  tactile cues for posture. Manual facilitation of L quad activation in L single leg stance as well. Performed blocked practice of sit<>stands to high/low table to work on pushing tendencies in stance. Pt pushes towards L side w/ all mobility, verbal, manual, and  tactile cues for R weight shifting w/ RUE support on table. He required max-total assist to put all weight on RLE. Returned to room and ended session in w/c, call bell within reach and all needs met. Pt's daughter present in room.   Therapy Documentation Precautions:  Precautions Precautions: Fall Precaution Comments: L sided weakness Restrictions Weight Bearing Restrictions: No Vital Signs:    See Function Navigator for Current Functional Status.  Therapy/Group: Individual Therapy  Rael Yo K Arnette 08/19/2017, 12:47 PM

## 2017-08-20 ENCOUNTER — Inpatient Hospital Stay (HOSPITAL_COMMUNITY): Payer: Medicare Other

## 2017-08-20 LAB — GLUCOSE, CAPILLARY
GLUCOSE-CAPILLARY: 83 mg/dL (ref 65–99)
Glucose-Capillary: 90 mg/dL (ref 65–99)
Glucose-Capillary: 127 mg/dL — ABNORMAL HIGH (ref 65–99)
Glucose-Capillary: 108 mg/dL — ABNORMAL HIGH (ref 65–99)

## 2017-08-20 MED ORDER — STROKE: EARLY STAGES OF RECOVERY BOOK
Freq: Once | Status: AC
  Administered 2017-08-20: 12:00:00
  Filled 2017-08-20: qty 1

## 2017-08-20 NOTE — Progress Notes (Signed)
Occupational Therapy Weekly Progress Note  Patient Details  Name: Fred [name] MRN: [id number] Date of Birth: 10-08-1935  Beginning of progress report period: August 12, 2017 End of progress report period: August 21, 2017  Today's Date: 08/21/2017 OT Individual Time: 1300-1415 OT Individual Time Calculation (min): 75 min   Patient has met 1 of 4 short term goals.  Pt is making slow but steady progress with OT at this time. He can stand with the stedy with mod A, but needs max A for transitional movements. Pt has developed some flexor tone in L UE, but has not demonstrated any muscle activation. Pt's pusher tendencies have decreased and he demonstrates improve trunk control within BADL tasks. Pt is learning hemi-dressing techniques which he is doing well recalling. Continue current plan of care.  Patient continues to demonstrate the following deficits: muscle weakness, impaired timing and sequencing, abnormal tone, unbalanced muscle activation, decreased coordination and decreased motor planning, decreased midline orientation and decreased attention to left, decreased initiation, decreased attention, decreased awareness, decreased problem solving, decreased safety awareness, decreased memory and delayed processing and decreased sitting balance, decreased standing balance, decreased postural control, hemiplegia and decreased balance strategies and therefore will continue to benefit from skilled OT intervention to enhance overall performance with BADL and Reduce care partner burden.  Patient progressing toward long term goals..  Continue plan of care.  OT Short Term Goals Week 1:  OT Short Term Goal 1 (Week 1): Pt will complete UB dressing using hemi-plegic technique with min A OT Short Term Goal 1 - Progress (Week 1): Progressing toward goal OT Short Term Goal 2 (Week 1): Pt will complete toilet transfers to [facility] with max A  OT Short Term Goal 2 - Progress (Week 1): Met OT Short Term Goal 3 (Week  1): Pt will complete shower transfer using grab bars with max A OT Short Term Goal 3 - Progress (Week 1): Progressing toward goal OT Short Term Goal 4 (Week 1): Pt will complete stand at sink for up to 1 minute with mod A  OT Short Term Goal 4 - Progress (Week 1): Progressing toward goal OT Short Term Goal 5 (Week 1): Pt will complete UB washing with min A on tub bench  OT Short Term Goal 5 - Progress (Week 1): Progressing toward goal Week 2:  OT Short Term Goal 1 (Week 2): Pt will recall hemi dressing techniques with min questioning cues OT Short Term Goal 2 (Week 2): Pt will complete 1/4 LB dressing steps  OT Short Term Goal 3 (Week 2): Pt will complete shower transfer using grab bars with max A OT Short Term Goal 4 (Week 2): Pt will complete stand at sink for up to 1 minute with mod A   Skilled Therapeutic Interventions/Progress Updates:    Pt greeted semi-reclined in bed and agreeable to OT. Pt came to sitting EOB with assistance to advance L side and mod A to elevate trunk. Stedy use with min A sit<>stand to transfer to wc. Wc propulsion to therapy gym using hemi-techniques with min A. Stand-pivot to therapy mat with max A and facilitation for power up and head/hips relationship with pivot. Dynamic reaching activity to facilitate weight bearing through L UE for neuro re-ed. Performed 3 sets of 10 scap mobilizations. Pt brought into gravity eliminated sidelying position for neuro re-ed using table for support and hand skate. Slight flexor tone noted. 1:1 NMES applied to wrist extensors.   Ratio 1:1 Rate 35 pps Waveform- Asymmetric Ramp  1.0 Pulse 300 Intensity- 22 Duration -  15   Report of pain at the beginning of session 0 Report of pain at the end of session 0  No adverse reactions after treatment and is skin intact. Pt returned to room and left seated in wc with safety belt on and needs met.   Therapy Documentation Precautions:  Precautions Precautions: Fall Precaution Comments:  L sided weakness Restrictions Weight Bearing Restrictions: No Pain:  none/denies pain ADL: ADL ADL Comments: see functional navigator  See Function Navigator for Current Functional Status.  Therapy/Group: Individual Therapy  Valma Cava 08/20/2017, 10:50 PM

## 2017-08-20 NOTE — Progress Notes (Addendum)
Physical Therapy Session Note  Patient Details  Name: Fred [name] MRN: [id number] Date of Birth: [birth year]  Today's Date: 08/20/2017 PT Individual Time: 0905-1005 PT Individual Time Calculation (min): 60 min   Short Term Goals: Week 1:  PT Short Term Goal 1 (Week 1): Pt will perform squat pivot transfer to bed with mod assist  PT Short Term Goal 1 - Progress (Week 1): Progressing toward goal PT Short Term Goal 2 (Week 1): Pt will propell WC x 169f with supervision assist  PT Short Term Goal 2 - Progress (Week 1): Met PT Short Term Goal 3 (Week 1): Pt will ambuate 115fwith max assist of 1.  PT Short Term Goal 3 - Progress (Week 1): Met PT Short Term Goal 4 (Week 1): Pt will maintian standing balance with mod assist and LRAD  PT Short Term Goal 4 - Progress (Week 1): Progressing toward goal PT Short Term Goal 5 (Week 1): pt will perform sit<>supine transfers with min assist from PT  PT Short Term Goal 5 - Progress (Week 1): Progressing toward goal Week 2:  PT Short Term Goal 1 (Week 2): Pt will transfer bed<>chair w/ mod assist PT Short Term Goal 2 (Week 2): Pt will demonstrate trace L quad activation during functional mobility 25% of the time PT Short Term Goal 3 (Week 2): Pt will ambulate 15' w/ mod assist  PT Short Term Goal 4 (Week 2): Pt will maintain dynamic sitting balance w/ min assist   Skilled Therapeutic Interventions/Progress Updates:   Pt's granddaughter MIndy here; she participated in family ed for rolling in bed (flat bed, no rails), bil bridging, positioning in R and L side lying with pillows and rolled bed pad for positioning/ comfort.  She observed R side lying> sitting. She was trained in w/c propulsion using hemi technique.   Seated EOB, pt worked on midline orientation, L/R lateral leans to return to midline.  Pt responds well to visual cues and verbal command to "shift" during LOB L.  Squat pivot transfer focusing on head /hips relationship, mod/max  assist.  Seated neuro re-ed LLE seated to facilitate hip extension, knee extension and ankle PF.  With multimodal cues and multiple reps of movements with intact RLE, pt eventually demonstrated minimal L hip extension.  No active motors detected yet in knee or ankle, in sitting.  W/c propulsion over level tile with shoes on, and pillow behind back to facilitate r foot reaching to floor.  Pt left resting in w/c with quick release belt applied and all needs within reach, and multiple family members present.  Fall precautions reiterated with pt and family..    Therapy Documentation Precautions:  Precautions Precautions: Fall Precaution Comments: L sided weakness Restrictions Weight Bearing Restrictions: No    Other Treatments: Treatments Therapeutic Activity: sitting EOB Neuromuscular Facilitation: Lower Extremity;Left;Activity to increase timing and sequencing;Activity to increase sustained activation;Activity to increase lateral weight shifting;Other (comment)(LLE wt bearing) Weight Bearing Technique Weight Bearing Technique: Yes LUE Weight Bearing Technique: Forearm seated   See Function Navigator for Current Functional Status.   Therapy/Group: Individual Therapy  Avagail Whittlesey 08/20/2017, 10:19 AM

## 2017-08-20 NOTE — Progress Notes (Signed)
PHYSICAL MEDICINE & REHABILITATION     PROGRESS NOTE  Subjective/Complaints:   Patient without new complaints.  Rested comfortably last night.  Family at bedside  ROS: Patient denies fever, rash, sore throat, blurred vision, nausea, vomiting, diarrhea, cough, shortness of breath or chest pain, joint or back pain, headache, or mood change.   Objective: Vital Signs: Blood pressure 131/74, pulse 76, temperature 98.4 F (36.9 C), temperature source Oral, resp. rate 18, weight 83.9 kg (184 lb 15.5 oz), SpO2 99 %. No results found. No results for input(s): WBC, HGB, HCT, PLT in the last 72 hours. Recent Labs    08/18/17 0734  CREATININE 1.25*   CBG (last 3)  Recent Labs    08/19/17 1630 08/19/17 2146 08/20/17 0702  GLUCAP 111* 83 90    Wt Readings from Last 3 Encounters:  08/20/17 83.9 kg (184 lb 15.5 oz)  08/10/17 84.4 kg (186 lb)  07/18/17 87.1 kg (192 lb)    Physical Exam:  BP 131/74 (BP Location: Right Arm)   Pulse 76   Temp 98.4 F (36.9 C) (Oral)   Resp 18   Wt 83.9 kg (184 lb 15.5 oz)   SpO2 99%   BMI 32.77 kg/m  Constitutional: No distress . Vital signs reviewed. HEENT: EOMI, oral membranes moist Neck: supple Cardiovascular: RRR without murmur. No JVD    Respiratory: CTA Bilaterally without wheezes or rales. Normal effort    GI: BS +, non-tender, non-distended  Musculoskeletal: He exhibits no edema or tenderness. Neurological: He is alert and oriented 3 Follows full commands.  Fair awareness of deficits.  Motor: LUE 0/5 remains LLE 1/5 hip add, 0/5 distally (stable exam) no resting tone Skin: Skin is warm and dry.  Psychiatric: He has a normal mood and affect. His behavior is normal.   Assessment/Plan: 1. 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 Physiatrist is providing close team supervision  and 24 hour management of active medical problems listed below. Physiatrist and rehab team continue to assess barriers to discharge/monitor patient progress toward functional and medical goals.  Function:  Bathing Bathing position   Position: Wheelchair/chair at sink(Stedy)  Bathing parts Body parts bathed by patient: Chest, Abdomen, Right upper leg, Left upper leg, Front perineal area, Left arm Body parts bathed by helper: Right arm, Buttocks, Left lower leg, Right lower leg, Back  Bathing assist Assist Level: Touching or steadying assistance(Pt > 75%)      Upper Body Dressing/Undressing Upper body dressing   What is the patient wearing?: Pull over shirt/dress     Pull over shirt/dress - Perfomed by patient: Thread/unthread right sleeve, Put head through opening Pull over shirt/dress - Perfomed by helper: Thread/unthread left sleeve, Pull shirt over trunk        Upper body assist Assist Level: Touching or steadying assistance(Pt > 75%)      Lower Body Dressing/Undressing Lower body dressing   What is the patient wearing?: Pants   Underwear - Performed by helper: Thread/unthread right underwear leg, Thread/unthread left underwear leg, Pull underwear up/down   Pants- Performed by helper: Pull pants up/down, Thread/unthread right pants leg, Thread/unthread left pants leg   Non-skid slipper socks- Performed by helper: Don/doff right sock, Don/doff left sock   Socks - Performed by helper: Don/doff left sock, Don/doff right sock   Shoes - Performed by helper: Don/doff right shoe, Don/doff left shoe  Lower body assist Assist for lower body dressing: Touching or steadying assistance (Pt > 75%)      Toileting Toileting Toileting activity did not occur: No continent bowel/bladder event   Toileting steps completed by helper: Adjust clothing prior to toileting, Performs perineal hygiene, Adjust clothing after toileting    Toileting assist Assist level: Two helpers    Transfers Chair/bed transfer   Chair/bed transfer method: Squat pivot Chair/bed transfer assist level: Maximal assist (Pt 25 - 49%/lift and lower) Chair/bed transfer assistive device: Mechanical lift Mechanical lift: Stedy   Locomotion Ambulation     Max distance: 30 Assist level: Maximal assist (Pt 25 - 49%)   Wheelchair   Type: Manual Max wheelchair distance: 150' Assist Level: Supervision or verbal cues  Cognition Comprehension Comprehension assist level: Understands basic 90% of the time/cues < 10% of the time  Expression Expression assist level: Expresses basic 90% of the time/requires cueing < 10% of the time.  Social Interaction Social Interaction assist level: Interacts appropriately 75 - 89% of the time - Needs redirection for appropriate language or to initiate interaction.  Problem Solving Problem solving assist level: Solves basic 90% of the time/requires cueing < 10% of the time  Memory Memory assist level: Recognizes or recalls 90% of the time/requires cueing < 10% of the time    Medical Problem List and Plan:  1. 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 Aspirin and Plavix x3 weeks then aspirin alone   Continue CIR PT, OT, tent d/c 6/28   2. DVT Prophylaxis/Anticoagulation: Subcutaneous Lovenox  3. Pain Management: Tylenol as needed  4. Mood: Provide emotional support  5. Neuropsych: This patient is capable of making decisions on his own behalf.  6. Skin/Wound Care: Routine skin checks  7. Fluids/Electrolytes/Nutrition: Routine in and outs   BMP within acceptable range on 6/3 8. CAD with CABG. Continue aspirin and Plavix. No chest pain or shortness of breath  9. Diabetes mellitus. Hemoglobin A1c 6.0. SSI. Check blood sugars before meals and at bedtime. Glucophage 1000 mg twice daily, Amaryl 4 mg daily, Lantus insulin 10 units nightly, Tradjenta 5 mg daily  CBG (last 3)  Recent Labs     08/19/17 1630 08/19/17 2146 08/20/17 0702  GLUCAP 111* 83 90   -Hypoglycemia improved with more consistent po intake 10. Dysphagia. Dysphasia #2 nectar liquids. Follow-up speech therapy  11. Hypertension. Cozaar 25 mg daily, Toprol 50 mg daily  Vitals:   08/19/17 1443 08/19/17 2143  BP: (!) 114/50 131/74  Pulse: 73 76  Resp: 17 18  Temp: 97.7 F (36.5 C) 98.4 F (36.9 C)  SpO2: 99% 99%  Controlled 6/9 ,   12. Diastolic congestive heart failure. Monitor for any signs of fluid overload. Patient was taking Lasix 40 mg daily if needed for fluid retention prior to admission.    Daily weights ordered--stable Filed Weights   08/18/17 0617 08/19/17 0703 08/20/17 0500  Weight: 85.1 kg (187 lb 9.8 oz) 83.5 kg (184 lb 1.4 oz) 83.9 kg (184 lb 15.5 oz)   13. History of seizure disorder. Depakote 500 mg daily.  14. Hyperlipidemia. Lipitor  15. History of alcohol use. Monitor for withdrawal  16.  Elevated Creat need to monitor given pt on Metformin 17.  Constipation-improved, reduced senna to 1po BID, continue Bisacodyl supp prn   LOS (Days) 9 A FACE TO FACE EVALUATION WAS PERFORMED  Meredith Staggers 08/20/2017 8:23 AM

## 2017-08-21 ENCOUNTER — Inpatient Hospital Stay (HOSPITAL_COMMUNITY): Payer: Medicare Other

## 2017-08-21 ENCOUNTER — Inpatient Hospital Stay (HOSPITAL_COMMUNITY): Payer: Medicare Other | Admitting: Occupational Therapy

## 2017-08-21 ENCOUNTER — Inpatient Hospital Stay (HOSPITAL_COMMUNITY): Payer: Medicare Other | Admitting: Speech Pathology

## 2017-08-21 ENCOUNTER — Inpatient Hospital Stay (HOSPITAL_COMMUNITY): Payer: Medicare Other | Admitting: Physical Therapy

## 2017-08-21 LAB — GLUCOSE, CAPILLARY
GLUCOSE-CAPILLARY: 55 mg/dL — AB (ref 65–99)
GLUCOSE-CAPILLARY: 79 mg/dL (ref 65–99)
Glucose-Capillary: 61 mg/dL — ABNORMAL LOW (ref 65–99)
Glucose-Capillary: 75 mg/dL (ref 65–99)
Glucose-Capillary: 177 mg/dL — ABNORMAL HIGH (ref 65–99)
Glucose-Capillary: 115 mg/dL — ABNORMAL HIGH (ref 65–99)

## 2017-08-21 MED ORDER — GLIMEPIRIDE 2 MG PO TABS
2.0000 mg | ORAL_TABLET | Freq: Every day | ORAL | Status: DC
  Administered 2017-08-21 – 2017-08-23 (×3): 2 mg via ORAL
  Filled 2017-08-21 (×2): qty 1

## 2017-08-21 NOTE — Progress Notes (Signed)
Physical Therapy Session Note  Patient Details  Name: Fred [name] MRN: [id number] Date of Birth: 10/17/1935  Today's Date: 08/21/2017 PT Individual Time: 1515-1626 PT Individual Time Calculation (min): 71 min   Short Term Goals: Week 2:  PT Short Term Goal 1 (Week 2): Pt will transfer bed<>chair w/ mod assist PT Short Term Goal 2 (Week 2): Pt will demonstrate trace L quad activation during functional mobility 25% of the time PT Short Term Goal 3 (Week 2): Pt will ambulate 15' w/ mod assist  PT Short Term Goal 4 (Week 2): Pt will maintain dynamic sitting balance w/ min assist   Skilled Therapeutic Interventions/Progress Updates:    no c/o pain.  Session focus on w/c mobility for activity tolerance and NMR for midline orientation in sitting and standing.  Pt propels w/c to therapy gym with R hemi-technique, with increased difficulty with socks and improved independence when shoes donned.  Requires increased time and cues for straight path navigation in w/c.  Squat/pivot to R w/c>mat>w/c throughout session, max assist overall with facilitation for forward weight shift, RLE positioning to prevent pushing, and multimodal cues for RUE placement to prevent pushing.  Seated edge of mat focus on static and dynamic midline orientation, mirror for visual feedback, and pt reaching to R for R weight shift.  Progress to reaching R with added challenge of clearing bottom from mat while maintaining midline with mod assist overall and max multimodal cues for maintaining positioning.  Pt able to achieve 1 partial sit<>stand with R reach and able to maintain equal weight bearing through LEs but unable to come to full upright posture.  Sit<>stand x2 from w/c positioned with elevated mat on R.  Static standing with mod/max assist and multimodal cues and facilitation for R weight shift, upright posture, RUE placement, L hip extension, and LLE activation in standing.  Kinetron 3x8 cycles at 60 cm/s LLE bias focus on L  mass extension for carryover to Transfers/standing.  Pt returned to bed at end of session, min assist for squat/pivot to R with pt pulling on bedrail.  Sit>supine min assist for LLE, and pt able to position self with min assist to position LLE in hook lying and bed in [place]berg. Positioned to comfort, call bell in reach, needs met.   Therapy Documentation Precautions:  Precautions Precautions: Fall Precaution Comments: L sided weakness Restrictions Weight Bearing Restrictions: No   See Function Navigator for Current Functional Status.   Therapy/Group: Individual Therapy   E  08/21/2017, 4:28 PM  

## 2017-08-21 NOTE — Progress Notes (Signed)
Speech Language Pathology Daily Session Note  Patient Details  Name: Fred [name] MRN: [id number] Date of Birth: 1936/02/19  Today's Date: 08/21/2017 SLP Individual Time: 0730-0830 SLP Individual Time Calculation (min): 60 min  Short Term Goals: Week 2: SLP Short Term Goal 1 (Week 2): Pt will demonstrate functional problem solving for mildly complex tasks with min A verbal cues. SLP Short Term Goal 2 (Week 2): Pt will utilize external memory aids to recall new, daily information with supervision verbal cues.  SLP Short Term Goal 3 (Week 2): Pt will consume current diet with minimal overt s/s of aspiration with Mod I for use of swallowing compensatory strategies.  SLP Short Term Goal 4 (Week 2): Pt will demonstrate efficient mastication and oral clearance with trials of regular textures with minimal overt s/s aspiration over 2 sessions with Mod I to demonstrate readiness for solid diet advancement,  Skilled Therapeutic Interventions: Skilled treatment session focused on dysphagia and cognitive goals. SLP facilitated session by providing skilled observation with breakfast meal of Dys. 3 textures with thin liquids. Patient consumed meal without overt s/s of aspiration and was Mod I for use of swallowing compensatory strategies. Patient also organized a BID pill box with intermittent supervision verbal cues to self-monitor and correct errors. Patient left upright in bed with all needs within reach and alarm on. Continue with current plan of care.      Function:  Eating Eating   Modified Consistency Diet: Yes Eating Assist Level: Set up assist for   Eating Set Up Assist For: Opening containers;Cutting food       Cognition Comprehension Comprehension assist level: Understands basic 90% of the time/cues < 10% of the time  Expression   Expression assist level: Expresses basic 90% of the time/requires cueing < 10% of the time.  Social Interaction Social Interaction assist level: Interacts  appropriately 75 - 89% of the time - Needs redirection for appropriate language or to initiate interaction.  Problem Solving Problem solving assist level: Solves basic 75 - 89% of the time/requires cueing 10 - 24% of the time  Memory Memory assist level: Recognizes or recalls 90% of the time/requires cueing < 10% of the time    Pain Pain Assessment Pain Score: 0-No pain  Therapy/Group: Individual Therapy  Fred [name] 08/21/2017, 12:50 PM

## 2017-08-21 NOTE — Plan of Care (Signed)
  Problem: RH SAFETY Goal: RH STG ADHERE TO SAFETY PRECAUTIONS W/ASSISTANCE/DEVICE Description STG Adhere to Safety Precautions With  Mod cues/ supervision Assistance/Device.   Outcome: Progressing  Continue to use call light system, proper footwear.  Problem: RH KNOWLEDGE DEFICIT Goal: RH STG INCREASE KNOWLEDGE OF DIABETES Description Pt will be able to explain how to monitor and manage diabetes/diet/medications using notes/resources independently  Outcome: Progressing  Educated on meds and on results of CBG

## 2017-08-21 NOTE — Progress Notes (Signed)
[facility] PHYSICAL MEDICINE & REHABILITATION     PROGRESS NOTE  Subjective/Complaints:  No issues overnite  ROS:No CP, SOB, bowel or bladder issues Objective: Vital Signs: Blood pressure 122/73, pulse 74, temperature 99.1 F (37.3 C), temperature source Oral, resp. rate 16, weight 84.1 kg (185 lb 6.5 oz), SpO2 98 %. No results found. No results for input(s): WBC, HGB, HCT, PLT in the last 72 hours. No results for input(s): NA, K, CL, GLUCOSE, BUN, CREATININE, CALCIUM in the last 72 hours.  Invalid input(s): CO CBG (last 3)  Recent Labs    08/20/17 2122 08/21/17 0641 08/21/17 0656  GLUCAP 83 61* 75    Wt Readings from Last 3 Encounters:  08/21/17 84.1 kg (185 lb 6.5 oz)  08/10/17 84.4 kg (186 lb)  07/18/17 87.1 kg (192 lb)    Physical Exam:  BP 122/73 (BP Location: Right Arm)   Pulse 74   Temp 99.1 F (37.3 C) (Oral)   Resp 16   Wt 84.1 kg (185 lb 6.5 oz)   SpO2 98%   BMI 32.84 kg/m  Constitutional: No distress . Vital signs reviewed. HEENT: EOMI, oral membranes moist Neck: supple Cardiovascular: RRR without murmur. No JVD    Respiratory: CTA Bilaterally without wheezes or rales. Normal effort    GI: BS +, non-tender, non-distended  Musculoskeletal: He exhibits no edema or tenderness. Neurological: He is alert and oriented 3 Follows full commands.  Fair awareness of deficits.  Motor: LUE 0/5 remains LLE 1/5 hip add, 0/5 distally (stable exam) no resting tone Skin: Skin is warm and dry.  Psychiatric: He has a normal mood and affect. His behavior is normal.   Assessment/Plan: 1. 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 Physiatrist is providing close team supervision and 24 hour management of active medical problems listed below. Physiatrist and rehab team continue to assess barriers to discharge/monitor patient  progress toward functional and medical goals.  Function:  Bathing Bathing position   Position: Wheelchair/chair at sink(Stedy)  Bathing parts Body parts bathed by patient: Chest, Abdomen, Right upper leg, Left upper leg, Front perineal area, Left arm Body parts bathed by helper: Right arm, Buttocks, Left lower leg, Right lower leg, Back  Bathing assist Assist Level: Touching or steadying assistance(Pt > 75%)      Upper Body Dressing/Undressing Upper body dressing   What is the patient wearing?: Pull over shirt/dress     Pull over shirt/dress - Perfomed by patient: Thread/unthread right sleeve, Put head through opening Pull over shirt/dress - Perfomed by helper: Thread/unthread left sleeve, Pull shirt over trunk        Upper body assist Assist Level: Touching or steadying assistance(Pt > 75%)      Lower Body Dressing/Undressing Lower body dressing   What is the patient wearing?: Pants   Underwear - Performed by helper: Thread/unthread right underwear leg, Thread/unthread left underwear leg, Pull underwear up/down   Pants- Performed by helper: Pull pants up/down, Thread/unthread right pants leg, Thread/unthread left pants leg   Non-skid slipper socks- Performed by helper: Don/doff right sock, Don/doff left sock   Socks - Performed by helper: Don/doff left sock, Don/doff right sock   Shoes - Performed by helper: Don/doff right shoe, Don/doff left shoe          Lower body assist Assist for lower body dressing: Touching or steadying assistance (Pt > 75%)  Toileting Toileting Toileting activity did not occur: No continent bowel/bladder event   Toileting steps completed by helper: Adjust clothing prior to toileting, Performs perineal hygiene, Adjust clothing after toileting    Toileting assist Assist level: Two helpers   Transfers Chair/bed transfer   Chair/bed transfer method: Squat pivot Chair/bed transfer assist level: Maximal assist (Pt 25 - 49%/lift and  lower) Chair/bed transfer assistive device: Mechanical lift Mechanical lift: Stedy   Locomotion Ambulation     Max distance: 30 Assist level: Maximal assist (Pt 25 - 49%)   Wheelchair   Type: Manual Max wheelchair distance: 150' Assist Level: Supervision or verbal cues  Cognition Comprehension Comprehension assist level: Understands basic 90% of the time/cues < 10% of the time  Expression Expression assist level: Expresses basic 90% of the time/requires cueing < 10% of the time.  Social Interaction Social Interaction assist level: Interacts appropriately 75 - 89% of the time - Needs redirection for appropriate language or to initiate interaction.  Problem Solving Problem solving assist level: Solves basic 75 - 89% of the time/requires cueing 10 - 24% of the time  Memory Memory assist level: Recognizes or recalls 90% of the time/requires cueing < 10% of the time    Medical Problem List and Plan:  1. 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 Aspirin and Plavix x3 weeks then aspirin alone   Continue CIR PT, OT, tent d/c 6/28   2. DVT Prophylaxis/Anticoagulation: Subcutaneous Lovenox  3. Pain Management: Tylenol as needed  4. Mood: Provide emotional support  5. Neuropsych: This patient is capable of making decisions on his own behalf.  6. Skin/Wound Care: Routine skin checks  7. Fluids/Electrolytes/Nutrition: Routine in and outs   BMP within acceptable range on 6/3 8. CAD with CABG. Continue aspirin and Plavix. No chest pain or shortness of breath  9. Diabetes mellitus. Hemoglobin A1c 6.0. SSI. Check blood sugars before meals and at bedtime Home meds  Glucophage 1000 mg twice daily, Amaryl 4 mg daily, Lantus insulin 10 units nightly, Tradjenta 5 mg daily  CBG (last 3)  Recent Labs    08/20/17 2122 08/21/17 0641 08/21/17 0656  GLUCAP 83 61* 75   -reduce amaryl to 2mg , cont Tradjenta 5mg  off Lantus 10.  Dysphagia. Dysphasia #2 nectar liquids. Follow-up speech therapy  11. Hypertension. Cozaar 25 mg daily, Toprol 50 mg daily  Vitals:   08/20/17 1937 08/21/17 0515  BP: 134/63 122/73  Pulse: 74 74  Resp: 16 16  Temp: 98.5 F (36.9 C) 99.1 F (37.3 C)  SpO2: 99% 98%  Controlled 6/10  12. Diastolic congestive heart failure. Monitor for any signs of fluid overload. Patient was taking Lasix 40 mg daily if needed for fluid retention prior to admission.    Daily weights ordered--stable Filed Weights   08/19/17 0703 08/20/17 0500 08/21/17 0515  Weight: 83.5 kg (184 lb 1.4 oz) 83.9 kg (184 lb 15.5 oz) 84.1 kg (185 lb 6.5 oz)   13. History of seizure disorder. Depakote 500 mg daily.  14. Hyperlipidemia. Lipitor  15. History of alcohol use. Monitor for withdrawal  16.  Elevated Creat need to monitor given pt on Metformin 17.  Constipation-improved, reduced senna to 1po BID, continue Bisacodyl supp prn   LOS (Days) 10 A FACE TO FACE EVALUATION WAS PERFORMED  Charlett Blake 08/21/2017 7:43 AM

## 2017-08-21 NOTE — Progress Notes (Signed)
Occupational Therapy Session Note  Patient Details  Name: Fred [name] MRN: [id number] Date of Birth: Sep 06, 1935  Today's Date: 08/21/2017 OT Individual Time: 1002-1045 OT Individual Time Calculation (min): 43 min    Short Term Goals: Week 2:  OT Short Term Goal 1 (Week 2): Pt will recall hemi dressing techniques with min questioning cues OT Short Term Goal 2 (Week 2): Pt will complete 1/4 LB dressing steps  OT Short Term Goal 3 (Week 2): Pt will complete shower transfer using grab bars with max A OT Short Term Goal 4 (Week 2): Pt will complete stand at sink for up to 1 minute with mod A   Skilled Therapeutic Interventions/Progress Updates:    Pt supine in bed agreeable to therapy with no c/o pain. Session focused on L NMR and ADL transfers. Vc provided for bed mobility technique, as well as manual facilitation to advance L leg toward EOB. Pt transitioned to EOB with min A. Pt completed squat pivot transfer with max A. Vc/manual facilitation provided for sequencing scooting back in chair, as well as mod A to scoot L hip back. Pt was transported to and from therapy gym for time management.   1:1 NMES applied to L bicepand L tricep to increase muscle activation and maintain muscle length/elasticity.  Ratio 1:3 Rate 40 pps Waveform- Asymmetric Ramp 1.0 Pulse 300 Intensity- 14 Duration -   15 min   Report of pain at the beginning of session : 0/10 Report of pain at the end of session: 0/10  No adverse reactions after treatment and skin is intact. Pt was returned to room and left sitting up in w/c with QRB donned.    Therapy Documentation Precautions:  Precautions Precautions: Fall Precaution Comments: L sided weakness Restrictions Weight Bearing Restrictions: No   Vital Signs: Therapy Vitals Temp: 99.1 F (37.3 C) Temp Source: Oral Pulse Rate: 74 Resp: 16 BP: (!) 115/54 Patient Position (if appropriate): Lying Oxygen Therapy SpO2: 98 % O2 Device: Room Air Pain:  No pain reported  ADL: ADL ADL Comments: see functional navigator  See Function Navigator for Current Functional Status.   Therapy/Group: Individual Therapy  Curtis Sites 08/21/2017, 8:43 AM

## 2017-08-21 NOTE — Progress Notes (Signed)
Hypoglycemic Event  CBG: 61  Treatment: 15 GM carbohydrate snack  Symptoms: None  Follow-up CBG: Time:61 CBG Result:75  Possible Reasons for Event: Inadequate meal intake  Comments/MD notified:     Dorice Lamas

## 2017-08-22 ENCOUNTER — Inpatient Hospital Stay (HOSPITAL_COMMUNITY): Payer: Medicare Other | Admitting: Speech Pathology

## 2017-08-22 ENCOUNTER — Inpatient Hospital Stay (HOSPITAL_COMMUNITY): Payer: Medicare Other

## 2017-08-22 ENCOUNTER — Inpatient Hospital Stay (HOSPITAL_COMMUNITY): Payer: Medicare Other | Admitting: Physical Therapy

## 2017-08-22 ENCOUNTER — Inpatient Hospital Stay (HOSPITAL_COMMUNITY): Payer: Medicare Other | Admitting: Occupational Therapy

## 2017-08-22 LAB — GLUCOSE, CAPILLARY
GLUCOSE-CAPILLARY: 156 mg/dL — AB (ref 65–99)
Glucose-Capillary: 83 mg/dL (ref 65–99)
Glucose-Capillary: 144 mg/dL — ABNORMAL HIGH (ref 65–99)
Glucose-Capillary: 80 mg/dL (ref 65–99)

## 2017-08-22 NOTE — Progress Notes (Signed)
Speech Language Pathology Daily Session Note  Patient Details  Name: Fred [name] MRN: [id number] Date of Birth: January 05, 1936  Today's Date: 08/22/2017 SLP Individual Time: 0930-1010 SLP Individual Time Calculation (min): 40 min  Short Term Goals: Week 2: SLP Short Term Goal 1 (Week 2): Pt will demonstrate functional problem solving for mildly complex tasks with min A verbal cues. SLP Short Term Goal 2 (Week 2): Pt will utilize external memory aids to recall new, daily information with supervision verbal cues.  SLP Short Term Goal 3 (Week 2): Pt will consume current diet with minimal overt s/s of aspiration with Mod I for use of swallowing compensatory strategies.  SLP Short Term Goal 4 (Week 2): Pt will demonstrate efficient mastication and oral clearance with trials of regular textures with minimal overt s/s aspiration over 2 sessions with Mod I to demonstrate readiness for solid diet advancement,  Skilled Therapeutic Interventions: Skilled treatment session focused on dysphagia and cognitive goals. Patient consumed trials of regular textures with efficient mastication and complete oral clearance without overt s/s of aspiration, therefore, recommend trial tray prior to upgrade. SLP also facilitated session by providing supervision verbal cues for recall of events from previous therapy sessions. Patient left upright in wheelchair with all needs within reach. Continue with current plan of care.      Function:  Eating Eating   Modified Consistency Diet: No(with trials from SLP ) Eating Assist Level: Set up assist for;Swallowing techniques: self managed   Eating Set Up Assist For: Opening containers       Cognition Comprehension Comprehension assist level: Understands basic 90% of the time/cues < 10% of the time  Expression   Expression assist level: Expresses basic 90% of the time/requires cueing < 10% of the time.  Social Interaction Social Interaction assist level: Interacts  appropriately 75 - 89% of the time - Needs redirection for appropriate language or to initiate interaction.  Problem Solving Problem solving assist level: Solves basic 90% of the time/requires cueing < 10% of the time  Memory Memory assist level: More than reasonable amount of time    Pain No/Denies Pain   Therapy/Group: Individual Therapy  [name] 08/22/2017, 2:19 PM

## 2017-08-22 NOTE — Progress Notes (Signed)
[place] PHYSICAL MEDICINE & REHABILITATION     PROGRESS NOTE  Subjective/Complaints:    Discussed boerderline low CBG, pt had snack last noc, feels like appetite is diminished  ROS:No CP, SOB, bowel or bladder issues Objective: Vital Signs: Blood pressure 133/63, pulse 68, temperature (!) 97.5 F (36.4 C), temperature source Oral, resp. rate 17, weight 85.7 kg (188 lb 15 oz), SpO2 97 %. No results found. No results for input(s): WBC, HGB, HCT, PLT in the last 72 hours. No results for input(s): NA, K, CL, GLUCOSE, BUN, CREATININE, CALCIUM in the last 72 hours.  Invalid input(s): CO CBG (last 3)  Recent Labs    08/21/17 1950 08/21/17 2118 08/22/17 0637  GLUCAP 79 115* 83    Wt Readings from Last 3 Encounters:  08/22/17 85.7 kg (188 lb 15 oz)  08/10/17 84.4 kg (186 lb)  07/18/17 87.1 kg (192 lb)    Physical Exam:  BP 133/63 (BP Location: Right Arm)   Pulse 68   Temp (!) 97.5 F (36.4 C) (Oral)   Resp 17   Wt 85.7 kg (188 lb 15 oz)   SpO2 97%   BMI 33.47 kg/m  Constitutional: No distress . Vital signs reviewed. HEENT: EOMI, oral membranes moist Neck: supple Cardiovascular: RRR without murmur. No JVD    Respiratory: CTA Bilaterally without wheezes or rales. Normal effort    GI: BS +, non-tender, non-distended  Musculoskeletal: He exhibits no edema or tenderness. Neurological: He is alert and oriented 3 Follows full commands.  Fair awareness of deficits.  Motor: LUE 0/5 remains LLE 1/5 hip add, 0/5 distally (stable exam) no resting tone Skin: Skin is warm and dry.  Psychiatric: He has a normal mood and affect. His behavior is normal.   Assessment/Plan: 1. 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 Physiatrist is providing close team supervision and 24 hour management of active medical problems listed  below. Physiatrist and rehab team continue to assess barriers to discharge/monitor patient progress toward functional and medical goals.  Function:  Bathing Bathing position   Position: Wheelchair/chair at sink(Stedy)  Bathing parts Body parts bathed by patient: Chest, Abdomen, Right upper leg, Left upper leg, Front perineal area, Left arm Body parts bathed by helper: Right arm, Buttocks, Left lower leg, Right lower leg, Back  Bathing assist Assist Level: Touching or steadying assistance(Pt > 75%)      Upper Body Dressing/Undressing Upper body dressing   What is the patient wearing?: Pull over shirt/dress     Pull over shirt/dress - Perfomed by patient: Thread/unthread right sleeve, Put head through opening Pull over shirt/dress - Perfomed by helper: Thread/unthread left sleeve, Pull shirt over trunk        Upper body assist Assist Level: Touching or steadying assistance(Pt > 75%)      Lower Body Dressing/Undressing Lower body dressing   What is the patient wearing?: Pants   Underwear - Performed by helper: Thread/unthread right underwear leg, Thread/unthread left underwear leg, Pull underwear up/down   Pants- Performed by helper: Pull pants up/down, Thread/unthread right pants leg, Thread/unthread left pants leg   Non-skid slipper socks- Performed by helper: Don/doff right sock, Don/doff left sock   Socks - Performed by helper: Don/doff left sock, Don/doff right sock   Shoes - Performed by helper: Don/doff right shoe, Don/doff left shoe          Lower body assist  Assist for lower body dressing: Touching or steadying assistance (Pt > 75%)      Toileting Toileting Toileting activity did not occur: No continent bowel/bladder event   Toileting steps completed by helper: Adjust clothing prior to toileting, Performs perineal hygiene, Adjust clothing after toileting    Toileting assist Assist level: Two helpers   Transfers Chair/bed transfer   Chair/bed transfer  method: Squat pivot Chair/bed transfer assist level: Maximal assist (Pt 25 - 49%/lift and lower) Chair/bed transfer assistive device: Mechanical lift Mechanical lift: Stedy   Locomotion Ambulation     Max distance: 30 Assist level: Maximal assist (Pt 25 - 49%)   Wheelchair   Type: Manual Max wheelchair distance: 150' Assist Level: Supervision or verbal cues  Cognition Comprehension Comprehension assist level: Understands basic 90% of the time/cues < 10% of the time  Expression Expression assist level: Expresses basic 90% of the time/requires cueing < 10% of the time.  Social Interaction Social Interaction assist level: Interacts appropriately 75 - 89% of the time - Needs redirection for appropriate language or to initiate interaction.  Problem Solving Problem solving assist level: Solves basic 75 - 89% of the time/requires cueing 10 - 24% of the time  Memory Memory assist level: Recognizes or recalls 90% of the time/requires cueing < 10% of the time    Medical Problem List and Plan:  1. 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 Aspirin and Plavix x3 weeks then aspirin alone   Continue CIR PT, OT, tent d/c 6/28  , team conf in am 2. DVT Prophylaxis/Anticoagulation: Subcutaneous Lovenox  3. Pain Management: Tylenol as needed  4. Mood: Provide emotional support  5. Neuropsych: This patient is capable of making decisions on his own behalf.  6. Skin/Wound Care: Routine skin checks  7. Fluids/Electrolytes/Nutrition: Routine in and outs   BMP within acceptable range on 6/3 8. CAD with CABG. Continue aspirin and Plavix. No chest pain or shortness of breath  9. Diabetes mellitus. Hemoglobin A1c 6.0. SSI. Check blood sugars before meals and at bedtime Home meds  Glucophage 1000 mg twice daily, Amaryl 4 mg daily, Lantus insulin 10 units nightly, Tradjenta 5 mg daily  CBG (last 3)  Recent Labs    08/21/17 1950  08/21/17 2118 08/22/17 0637  GLUCAP 79 115* 83   -reduce amaryl to 2mg , cont Tradjenta 5mg , off Lantus, controlled but on low side, meal intake 50-70% 10. Dysphagia. Dysphasia #2 nectar liquids. Follow-up speech therapy  11. Hypertension. Cozaar 25 mg daily, Toprol 50 mg daily  Vitals:   08/21/17 2111 08/22/17 0521  BP: 125/66 133/63  Pulse: 77 68  Resp: 17 17  Temp: 98.4 F (36.9 C) (!) 97.5 F (36.4 C)  SpO2: 98% 97%  Controlled 6/11  12. Diastolic congestive heart failure. Monitor for any signs of fluid overload. Patient was taking Lasix 40 mg daily if needed for fluid retention prior to admission.    Daily weights ordered--stable Filed Weights   08/20/17 0500 08/21/17 0515 08/22/17 0521  Weight: 83.9 kg (184 lb 15.5 oz) 84.1 kg (185 lb 6.5 oz) 85.7 kg (188 lb 15 oz)   13. History of seizure disorder. Depakote 500 mg daily.  14. Hyperlipidemia. Lipitor  15. History of alcohol use. Monitor for withdrawal  16.  Elevated Creat need to monitor given pt on Metformin 17.  Constipation-improved, reduced senna to 1po BID,Continent of stool continue Bisacodyl supp prn   LOS (Days) 11 A FACE  TO FACE EVALUATION WAS PERFORMED  Charlett Blake 08/22/2017 7:01 AM

## 2017-08-22 NOTE — Progress Notes (Signed)
Physical Therapy Session Note  Patient Details  Name: Fred [name] MRN: [id number] Date of Birth: [birth year]  Today's Date: 08/22/2017 PT Individual Time: 0830-0900 AND 1030-1100 PT Individual Time Calculation (min): 30 min AND 30 min  Short Term Goals: Week 2:  PT Short Term Goal 1 (Week 2): Pt will transfer bed<>chair w/ mod assist PT Short Term Goal 2 (Week 2): Pt will demonstrate trace L quad activation during functional mobility 25% of the time PT Short Term Goal 3 (Week 2): Pt will ambulate 15' w/ mod assist  PT Short Term Goal 4 (Week 2): Pt will maintain dynamic sitting balance w/ min assist   Skilled Therapeutic Interventions/Progress Updates:   Session 1:  Pt in supine and agreeable to therapy, denies pain. Transferred to EOB w/ min assist and maintained static sitting balance w/ close supervision while therapist set-up transfer. Transferred via squat pivot to w/c w/ mod assist to R side, much improved from previous sessions with this therapist. Pt able to initiate pivoting of transfer w/ minimal tactile and verbal cues. Pt's R w/c brake continuing to be loose and causing w/c to move slightly during transfers and sit<>stands from w/c. Session focused on w/c parts mgmt including adjusting/tightening of brake and educating pt on w/c use after d/c. Pt agrees that building a w/c ramp may be beneficial long-term for both himself and his wife. He does have a supportive family that plans to provide physical assistance at d/c, but specifically discussed decreased burden of care on family members w/ ramp vs w/c bumping up steps. Pt in agreement and has been discussing this with his family. Returned to room and ended session in w/c, call bell within reach and all needs met.   Session 2:  Pt in w/c and agreeable to therapy, denies pain. Total assist w/c transport to/from gym for time management. Session focused on blocked practice of sit<>stands and LLE NMR. Performed 4-5 sit<>stands in  parallel bars, mod assist to boost but max manual and tactile assist to facilitate power up w/ LLE and to maintain LLE extension in stance. Performed lateral weight shifts and assisted L TKEs in stance w/ tactile cues for L quad activation. Performed kinetron 3 min x3 reps w/ mod manual assist to maintain neutral LLE alignment, tactile cues for L quad activation as well. Returned to room and ended session in w/c, in care of wife and all needs met.   Therapy Documentation Precautions:  Precautions Precautions: Fall Precaution Comments: L sided weakness Restrictions Weight Bearing Restrictions: No  See Function Navigator for Current Functional Status.   Therapy/Group: Individual Therapy  Aliene Tamura K Arnette 08/22/2017, 3:45 PM

## 2017-08-22 NOTE — Progress Notes (Signed)
Physical Therapy Session Note  Patient Details  Name: Fred [name] MRN: [id number] Date of Birth: [birth year]  Today's Date: 08/22/2017 PT Individual Time: 1500-1530 PT Individual Time Calculation (min): 30 min   Short Term Goals: Week 2:  PT Short Term Goal 1 (Week 2): Pt will transfer bed<>chair w/ mod assist PT Short Term Goal 2 (Week 2): Pt will demonstrate trace L quad activation during functional mobility 25% of the time PT Short Term Goal 3 (Week 2): Pt will ambulate 15' w/ mod assist  PT Short Term Goal 4 (Week 2): Pt will maintain dynamic sitting balance w/ min assist   Skilled Therapeutic Interventions/Progress Updates:    Pt seated in w/c upon PT arrival, agreeable to therapy tx and denies pain. Pt transported to the gym in w/c. Pt performed 2 sit<>stands facing an elevated mat for UE support with max assist, while in standing working on upright posture with manual facilitation for hip extension, pt with pushing to the L noted. Pt performed x 2 sit<>stands max assist from w/c with raised mat on R side for tactile cue to prevent pushing to left, in standing pt reaching for bean bags, manual facilitation for hip extension and verbal cues to touch R hip to mat. Pt transported back to room and left seated in w/c with needs in reach and wife present.   Therapy Documentation Precautions:  Precautions Precautions: Fall Precaution Comments: L sided weakness Restrictions Weight Bearing Restrictions: No   See Function Navigator for Current Functional Status.   Therapy/Group: Individual Therapy  Netta Corrigan, PT, DPT 08/22/2017, 3:18 PM

## 2017-08-22 NOTE — Progress Notes (Signed)
Occupational Therapy Session Note  Patient Details  Name: Fred [name] MRN: [id number] Date of Birth: [birth year]  Today's Date: 08/22/2017 OT Individual Time: 4383-7793 OT Individual Time Calculation (min): 73 min    Short Term Goals: Week 2:  OT Short Term Goal 1 (Week 2): Pt will recall hemi dressing techniques with min questioning cues OT Short Term Goal 2 (Week 2): Pt will complete 1/4 LB dressing steps  OT Short Term Goal 3 (Week 2): Pt will complete shower transfer using grab bars with max A OT Short Term Goal 4 (Week 2): Pt will complete stand at sink for up to 1 minute with mod A   Skilled Therapeutic Interventions/Progress Updates:    Pt greeted seated in wc and agreeable to OT. Pt declined any bathing/dressing this session, but agreeable to shower tomorrow. Worked on wc propulsion to therapy gym with supervision overall. Squat-pivot transfer wc to therapy mat on R side with Mod A with L knee block and facilitation for pivoting over. Worked on trunk shortening/elonation with overhead reaching activity using NDT techniques. L UE NMR with towel pushes on table. Pt with trace shoulder activation after OT facilitated multiple towel pushes. Gentle massage and PROM to L UE to decrease tone. 1:1 NMES applied to wrist extensors. Ratio 1:1 Rate 35 pps Waveform- Asymmetric Ramp 1.0 Pulse 300 Intensity- 25 Duration - 15     Report of pain at the beginning of session 0 Report of pain at the end of session 0  No adverse reactions after treatment and is skin intact. Pt completed squat-pivot back to wc at end of session with mod A overall. Pt returned to room and left seated in wc with safety belt on and needs met.    Therapy Documentation Precautions:  Precautions Precautions: Fall Precaution Comments: L sided weakness Restrictions Weight Bearing Restrictions: No Pain:  none/denies pain ADL: ADL ADL Comments: see functional navigator  See Function Navigator for Current  Functional Status.   Therapy/Group: Individual Therapy  Valma Cava 08/22/2017, 1:51 PM

## 2017-08-23 ENCOUNTER — Inpatient Hospital Stay (HOSPITAL_COMMUNITY): Payer: Medicare Other | Admitting: Occupational Therapy

## 2017-08-23 ENCOUNTER — Inpatient Hospital Stay (HOSPITAL_COMMUNITY): Payer: Medicare Other

## 2017-08-23 ENCOUNTER — Inpatient Hospital Stay (HOSPITAL_COMMUNITY): Payer: Medicare Other | Admitting: Speech Pathology

## 2017-08-23 LAB — GLUCOSE, CAPILLARY
GLUCOSE-CAPILLARY: 70 mg/dL (ref 65–99)
GLUCOSE-CAPILLARY: 184 mg/dL — AB (ref 65–99)
GLUCOSE-CAPILLARY: 39 mg/dL — AB (ref 65–99)
Glucose-Capillary: 51 mg/dL — ABNORMAL LOW (ref 65–99)
Glucose-Capillary: 82 mg/dL (ref 65–99)
Glucose-Capillary: 155 mg/dL — ABNORMAL HIGH (ref 65–99)

## 2017-08-23 MED ORDER — METFORMIN HCL 850 MG PO TABS
850.0000 mg | ORAL_TABLET | Freq: Two times a day (BID) | ORAL | Status: DC
  Administered 2017-08-23: 850 mg via ORAL
  Filled 2017-08-23 (×3): qty 1

## 2017-08-23 NOTE — Progress Notes (Signed)
Occupational Therapy Session Note  Patient Details  Name: Fred [name] MRN: [id number] Date of Birth: [birth year]  Today's Date: 08/23/2017 OT Individual Time: 1330-1430 OT Individual Time Calculation (min): 60 min   Short Term Goals: Week 2:  OT Short Term Goal 1 (Week 2): Pt will recall hemi dressing techniques with min questioning cues OT Short Term Goal 2 (Week 2): Pt will complete 1/4 LB dressing steps  OT Short Term Goal 3 (Week 2): Pt will complete shower transfer using grab bars with max A OT Short Term Goal 4 (Week 2): Pt will complete stand at sink for up to 1 minute with mod A   Skilled Therapeutic Interventions/Progress Updates:    Pt greeted seated in wc and agreeable to showering today. Stedy transfer in/out of shower with mod A to stand. Bathing completed with OT providing hand over hand A with L UE to wash body parts-increased tone in L UE but improved with gentle ROM. Dressing completed wc level at the sink with multimodal cues to recall hemi-dressing techniques. Tried Merchandiser, retail for pt to thread R LE into pants but still needed assist to pull over R LE. Sit<>stand without stedy at the sink with OT providing L knee block and facilitation to achieve full upright position. Then worked on pt pulling up pants with OT providing max A and multimodal cues to correct LOB to L. Pt left seated in wc at end of session with safety belt on and needs met.   Therapy Documentation Precautions:  Precautions Precautions: Fall Precaution Comments: L sided weakness Restrictions Weight Bearing Restrictions: No Pain:  none denies pain ADL: ADL ADL Comments: see functional navigator  See Function Navigator for Current Functional Status.   Therapy/Group: Individual Therapy  Valma Cava 08/23/2017, 2:46 PM

## 2017-08-23 NOTE — Progress Notes (Signed)
Hypoglycemic Event  CBG: 33  Treatment: 15 GM carbohydrate snack  Symptoms: None  Follow-up CBG: Time:1739 CBG Result: 82  Possible Reasons for Event: Unknown  Comments/MD notified:*Pamela Love, PA continue to observe and reassess.Earnie Larsson, Florian Buff D

## 2017-08-23 NOTE — Plan of Care (Signed)
  Problem: RH SAFETY Goal: RH STG ADHERE TO SAFETY PRECAUTIONS W/ASSISTANCE/DEVICE Description STG Adhere to Safety Precautions With  Mod cues/ supervision Assistance/Device.   Outcome: Progressing  Call light at hand, bed alarm, chair alarm, proper footwear

## 2017-08-23 NOTE — Telephone Encounter (Signed)
error 

## 2017-08-23 NOTE — Progress Notes (Signed)
[place] PHYSICAL MEDICINE & REHABILITATION     PROGRESS NOTE  Subjective/Complaints:   No issues overnite, discussed borderline low CBG and Appetitie   ROS:No CP, SOB, bowel or bladder issues Objective: Vital Signs: Blood pressure 125/64, pulse 71, temperature 97.6 F (36.4 C), temperature source Oral, resp. rate 17, weight 83.5 kg (184 lb 1.4 oz), SpO2 96 %. No results found. No results for input(s): WBC, HGB, HCT, PLT in the last 72 hours. No results for input(s): NA, K, CL, GLUCOSE, BUN, CREATININE, CALCIUM in the last 72 hours.  Invalid input(s): CO CBG (last 3)  Recent Labs    08/22/17 1722 08/22/17 2126 08/23/17 0634  GLUCAP 156* 80 70    Wt Readings from Last 3 Encounters:  08/23/17 83.5 kg (184 lb 1.4 oz)  08/10/17 84.4 kg (186 lb)  07/18/17 87.1 kg (192 lb)    Physical Exam:  BP 125/64 (BP Location: Right Arm)   Pulse 71   Temp 97.6 F (36.4 C) (Oral)   Resp 17   Wt 83.5 kg (184 lb 1.4 oz)   SpO2 96%   BMI 32.61 kg/m  Constitutional: No distress . Vital signs reviewed. HEENT: EOMI, oral membranes moist Neck: supple Cardiovascular: RRR without murmur. No JVD    Respiratory: CTA Bilaterally without wheezes or rales. Normal effort    GI: BS +, non-tender, non-distended  Musculoskeletal: He exhibits no edema or tenderness. Neurological: He is alert and oriented 3 Follows full commands.  Fair awareness of deficits.  Motor: LUE 0/5 remains LLE 1/5 hip add, 0/5 distally (stable exam) no resting tone Skin: Skin is warm and dry.  Psychiatric: He has a normal mood and affect. His behavior is normal.   Assessment/Plan: 1. 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 Physiatrist is providing close team supervision and 24 hour management of active medical problems listed below. Physiatrist and rehab team continue  to assess barriers to discharge/monitor patient progress toward functional and medical goals.  Function:  Bathing Bathing position   Position: Wheelchair/chair at sink(Stedy)  Bathing parts Body parts bathed by patient: Chest, Abdomen, Right upper leg, Left upper leg, Front perineal area, Left arm Body parts bathed by helper: Right arm, Buttocks, Left lower leg, Right lower leg, Back  Bathing assist Assist Level: Touching or steadying assistance(Pt > 75%)      Upper Body Dressing/Undressing Upper body dressing   What is the patient wearing?: Pull over shirt/dress     Pull over shirt/dress - Perfomed by patient: Thread/unthread right sleeve, Put head through opening Pull over shirt/dress - Perfomed by helper: Thread/unthread left sleeve, Pull shirt over trunk        Upper body assist Assist Level: Touching or steadying assistance(Pt > 75%)      Lower Body Dressing/Undressing Lower body dressing   What is the patient wearing?: Pants   Underwear - Performed by helper: Thread/unthread right underwear leg, Thread/unthread left underwear leg, Pull underwear up/down   Pants- Performed by helper: Pull pants up/down, Thread/unthread right pants leg, Thread/unthread left pants leg   Non-skid slipper socks- Performed by helper: Don/doff right sock, Don/doff left sock   Socks - Performed by helper: Don/doff left sock, Don/doff right sock   Shoes - Performed by helper: Don/doff right shoe, Don/doff left shoe          Lower body assist Assist for lower body dressing: Touching or  steadying assistance (Pt > 75%)      Toileting Toileting Toileting activity did not occur: No continent bowel/bladder event   Toileting steps completed by helper: Adjust clothing prior to toileting, Performs perineal hygiene, Adjust clothing after toileting    Toileting assist Assist level: Two helpers   Transfers Chair/bed transfer   Chair/bed transfer method: Squat pivot Chair/bed transfer assist  level: Moderate assist (Pt 50 - 74%/lift or lower) Chair/bed transfer assistive device: Armrests Mechanical lift: Ecologist     Max distance: 30 Assist level: Maximal assist (Pt 25 - 49%)   Wheelchair   Type: Manual Max wheelchair distance: 150' Assist Level: Supervision or verbal cues  Cognition Comprehension Comprehension assist level: Understands basic 90% of the time/cues < 10% of the time  Expression Expression assist level: Expresses basic 90% of the time/requires cueing < 10% of the time.  Social Interaction Social Interaction assist level: Interacts appropriately 75 - 89% of the time - Needs redirection for appropriate language or to initiate interaction.  Problem Solving Problem solving assist level: Solves basic 90% of the time/requires cueing < 10% of the time  Memory Memory assist level: More than reasonable amount of time    Medical Problem List and Plan:  1. 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 Aspirin and Plavix x3 weeks then aspirin alone   Continue CIR PT, OT, tent d/c 6/28  ,Team conference today please see physician documentation under team conference tab, met with team face-to-face to discuss problems,progress, and goals. Formulized individual treatment plan based on medical history, underlying problem and comorbidities. 2. DVT Prophylaxis/Anticoagulation: Subcutaneous Lovenox  3. Pain Management: Tylenol as needed  4. Mood: Provide emotional support  5. Neuropsych: This patient is capable of making decisions on his own behalf.  6. Skin/Wound Care: Routine skin checks  7. Fluids/Electrolytes/Nutrition: Routine in and outs   BMP within acceptable range on 6/3 8. CAD with CABG. Continue aspirin and Plavix. No chest pain or shortness of breath  9. Diabetes mellitus. Hemoglobin A1c 6.0. SSI. Check blood sugars before meals and at bedtime Home meds  Glucophage 1000  mg twice daily, Amaryl 4 mg daily, Lantus insulin 10 units nightly, Tradjenta 5 mg daily  CBG (last 3)  Recent Labs    08/22/17 1722 08/22/17 2126 08/23/17 0634  GLUCAP 156* 80 70   -reduce amaryl to '2mg'$ , cont Tradjenta '5mg'$ , off Lantus, controlled but on low side, meal intake 50-70%, reduce metformin to '850mg'$  10. Dysphagia. Dysphasia #2 nectar liquids. Follow-up speech therapy  11. Hypertension. Cozaar 25 mg daily, Toprol 50 mg daily  Vitals:   08/22/17 2126 08/23/17 0517  BP: 125/65 125/64  Pulse: 75 71  Resp: 15 17  Temp: 98 F (36.7 C) 97.6 F (36.4 C)  SpO2: 98% 96%  Controlled 6/12  12. Diastolic congestive heart failure. Monitor for any signs of fluid overload. Patient was taking Lasix 40 mg daily if needed for fluid retention prior to admission.    Daily weights ordered--stable Filed Weights   08/21/17 0515 08/22/17 0521 08/23/17 0517  Weight: 84.1 kg (185 lb 6.5 oz) 85.7 kg (188 lb 15 oz) 83.5 kg (184 lb 1.4 oz)   13. History of seizure disorder. Depakote 500 mg daily.  14. Hyperlipidemia. Lipitor  15. History of alcohol use. Monitor for withdrawal  16.  Elevated Creat need to monitor given pt on Metformin 17.  Constipation-improved, reduced senna to 1po BID,Continent of  stool continue Bisacodyl supp prn   LOS (Days) 12 A FACE TO FACE EVALUATION WAS PERFORMED  Charlett Blake 08/23/2017 7:02 AM

## 2017-08-23 NOTE — Progress Notes (Signed)
Speech Language Pathology Daily Session Note  Patient Details  Name: Fred [name] MRN: [id number] Date of Birth: 04/12/1935  Today's Date: 08/23/2017 SLP Individual Time: 1135-1200 SLP Individual Time Calculation (min): 25 min  Short Term Goals: Week 2: SLP Short Term Goal 1 (Week 2): Pt will demonstrate functional problem solving for mildly complex tasks with min A verbal cues. SLP Short Term Goal 2 (Week 2): Pt will utilize external memory aids to recall new, daily information with supervision verbal cues.  SLP Short Term Goal 3 (Week 2): Pt will consume current diet with minimal overt s/s of aspiration with Mod I for use of swallowing compensatory strategies.  SLP Short Term Goal 4 (Week 2): Pt will demonstrate efficient mastication and oral clearance with trials of regular textures with minimal overt s/s aspiration over 2 sessions with Mod I to demonstrate readiness for solid diet advancement,  Skilled Therapeutic Interventions:  Skilled treatment session focused on dysphagia goals. SLP facilitated session by providing skilled observation with lunch meal of regular textures and thin liquids. Patient demonstrated efficient mastication with complete oral clearance and 1 overt cough, suspect due to attempting to talk with a full oral cavity. Recommend patient upgrade to regular textures. Patient left upright in wheelchair with family present. Continue with current plan of care.      Function:  Eating Eating   Modified Consistency Diet: No Eating Assist Level: Set up assist for;Swallowing techniques: self managed   Eating Set Up Assist For: Opening containers       Cognition Comprehension Comprehension assist level: Follows basic conversation/direction with no assist  Expression   Expression assist level: Expresses basic needs/ideas: With no assist  Social Interaction Social Interaction assist level: Interacts appropriately with others with medication or extra time (anti-anxiety,  antidepressant).  Problem Solving Problem solving assist level: Solves basic problems with no assist  Memory Memory assist level: More than reasonable amount of time    Pain No/Denies Pain   Therapy/Group: Individual Therapy  [name] 08/23/2017, 12:56 PM

## 2017-08-23 NOTE — Progress Notes (Signed)
Physical Therapy Session Note  Patient Details  Name: Fred [name] MRN: [id number] Date of Birth: 1935/07/24  Today's Date: 08/23/2017 PT Individual Time: 0932-3557 AND 1615-1700 PT Individual Time Calculation (min): 58 min AND 45 min   Short Term Goals: Week 2:  PT Short Term Goal 1 (Week 2): Pt will transfer bed<>chair w/ mod assist PT Short Term Goal 2 (Week 2): Pt will demonstrate trace L quad activation during functional mobility 25% of the time PT Short Term Goal 3 (Week 2): Pt will ambulate 15' w/ mod assist  PT Short Term Goal 4 (Week 2): Pt will maintain dynamic sitting balance w/ min assist   Skilled Therapeutic Interventions/Progress Updates:    Session 1: Pt supine in bed upon PT arrival, agreeable to therapy tx and denies pain. Pt transferred from supine>sitting EOB with mod assist. Pt sitting EOB requiring supervision-min assist for seated balance secondary to occasional posterior LOB, verbal/tactile cues to correct. Pt performed stand pivot transfer from bed>w/c with max assist. Pt transported to the gym. Pt performed sit<>stands throughout session using R handrail and mod assist, emphasis on anterior weight shift. Pt performed pre-gait at the R rail with mod assist for standing balance, verbal cues for sequencing. Pt ambulated 2 x 30 ft this session using R rail in hall, ACE wrap for DF, pt able to activate hip flexors but requires total assist to advance L LE during swing, total assist for L knee block during stance secondary to lack of quadriceps activation, max assist overall. Pt propelled w/c back to room with supervision and R hemi techniques, left seated with QRB in place and needs in reach.   Session 2: Pt seated in w/c upon PT arrival, agreeable to therapy tx and denies pain. Pt propelled w/c from room>gym x 100 ft with supervision. Pt performed sit<>stands x 5 using R handrail with min assist throughout session. Once in standing pt worked on letting go of rail for  static standing balance without UE support while maintaining midline, pt has tendency to lean L, verbal and tactile cues for therapist to correct. In standing pt worked on dynamic standing balance to sort bean bags without UE support, min assist with emphasis on maintaining midline and postural control, x 2 trials. Pt performed active knee extension in sitting without shoe on to allow foot to slide on floor, x 10. Pt performed sit<>stand and able to activate quads in standing. Pt transported back to room and left seated in w/c with needs in reach and wife present.   Therapy Documentation Precautions:  Precautions Precautions: Fall Precaution Comments: L sided weakness Restrictions Weight Bearing Restrictions: No   See Function Navigator for Current Functional Status.   Therapy/Group: Individual Therapy  Netta Corrigan, PT, DPT   08/23/2017, 7:49 AM

## 2017-08-24 ENCOUNTER — Inpatient Hospital Stay (HOSPITAL_COMMUNITY): Payer: Medicare Other | Admitting: Speech Pathology

## 2017-08-24 ENCOUNTER — Inpatient Hospital Stay (HOSPITAL_COMMUNITY): Payer: Medicare Other | Admitting: Physical Therapy

## 2017-08-24 ENCOUNTER — Inpatient Hospital Stay (HOSPITAL_COMMUNITY): Payer: Medicare Other | Admitting: Occupational Therapy

## 2017-08-24 LAB — GLUCOSE, CAPILLARY
GLUCOSE-CAPILLARY: 154 mg/dL — AB (ref 65–99)
Glucose-Capillary: 89 mg/dL (ref 65–99)
Glucose-Capillary: 153 mg/dL — ABNORMAL HIGH (ref 65–99)
Glucose-Capillary: 147 mg/dL — ABNORMAL HIGH (ref 65–99)

## 2017-08-24 MED ORDER — METFORMIN HCL 500 MG PO TABS
500.0000 mg | ORAL_TABLET | Freq: Two times a day (BID) | ORAL | Status: DC
  Administered 2017-08-24 – 2017-09-13 (×41): 500 mg via ORAL
  Filled 2017-08-24 (×41): qty 1

## 2017-08-24 NOTE — Plan of Care (Signed)
  Problem: RH SAFETY Goal: RH STG ADHERE TO SAFETY PRECAUTIONS W/ASSISTANCE/DEVICE Description STG Adhere to Safety Precautions With  Mod cues/ supervision Assistance/Device.   Outcome: Progressing  Call light at hand, bed/chair alarm administered, proper foot wear  Problem: RH KNOWLEDGE DEFICIT Goal: RH STG INCREASE KNOWLEDGE OF DIABETES Description Pt will be able to explain how to monitor and manage diabetes/diet/medications using notes/resources independently  Outcome: Progressing  Educated pt on diabetic medications and changes.

## 2017-08-24 NOTE — Progress Notes (Signed)
Speech Language Pathology Session Note & Discharge Summary  Patient Details  Name: Fred [name] MRN: [id number] Date of Birth: 1935/09/09  Today's Date: 08/24/2017 SLP Individual Time: 1130-1200 SLP Individual Time Calculation (min): 30 min   Skilled Therapeutic Interventions: Skilled treatment session focused on dysphagia goals. Patient consumed lunch meal of regular textures with thin liquids with Mod I for use of swallowing compensatory strategies. However, patient demonstrated overt cough X 1, suspect due to talking with food in oral cavity. Recommend patient continue current diet of regular textures with thin liquids. Patient is at his baseline level of swallowing function, therefore he will be discharged from skilled SLP intervention. Patient educated on swallowing function, diet recommendations and swallowing strategies. He verbalized understanding of all information. Patient left upright in wheelchair with all needs within reach.    Patient has met 6 of 6 long term goals.  Patient to discharge at overall Modified Independent level.   Reasons goals not met: N/A    Clinical Impression/Discharge Summary: Patient has made excellent gains and has met 6 of 6 LTG's this admission. Currently, patient is consuming regular textures with thin liquids with minimal overt s/s of aspiration and is Mod I for use of swallowing compensatory strategies. Patient is also overall Mod I for problem solving, recall of functional information and awareness. Patient is at his baseline level of cognitive and swallowing function, therefore, patient will be discharged from skilled SLP intervention with f/u not warranted at this time. Patient and family education complete, all verbalized understanding and agreement.    Recommendation:  None      Equipment: N/A   Reasons for discharge: Treatment goals met   Patient/Family Agrees with Progress Made and Goals Achieved: Yes   Function:  Eating Eating    Modified Consistency Diet: No Eating Assist Level: Set up assist for;Swallowing techniques: self managed   Eating Set Up Assist For: Opening containers       Cognition Comprehension Comprehension assist level: Follows basic conversation/direction with extra time/assistive device  Expression   Expression assist level: Expresses basic needs/ideas: With extra time/assistive device  Social Interaction Social Interaction assist level: Interacts appropriately with others with medication or extra time (anti-anxiety, antidepressant).  Problem Solving Problem solving assist level: Solves complex problems: With extra time  Memory Memory assist level: More than reasonable amount of time   [name] 08/24/2017, 3:06 PM

## 2017-08-24 NOTE — Progress Notes (Signed)
[place] PHYSICAL MEDICINE & REHABILITATION     PROGRESS NOTE  Subjective/Complaints:    low CBG 459pm, pt states he was not feeling funny, skipped hs snack last noc    ROS:No CP, SOB, bowel or bladder issues Objective: Vital Signs: Blood pressure 128/64, pulse 78, temperature 97.9 F (36.6 C), resp. rate 18, weight 83.5 kg (184 lb 1.4 oz), SpO2 96 %. No results found. No results for input(s): WBC, HGB, HCT, PLT in the last 72 hours. No results for input(s): NA, K, CL, GLUCOSE, BUN, CREATININE, CALCIUM in the last 72 hours.  Invalid input(s): CO CBG (last 3)  Recent Labs    08/23/17 1739 08/23/17 2105 08/24/17 0630  GLUCAP 82 155* 89    Wt Readings from Last 3 Encounters:  08/23/17 83.5 kg (184 lb 1.4 oz)  08/10/17 84.4 kg (186 lb)  07/18/17 87.1 kg (192 lb)    Physical Exam:  BP 128/64 (BP Location: Left Arm)   Pulse 78   Temp 97.9 F (36.6 C)   Resp 18   Wt 83.5 kg (184 lb 1.4 oz)   SpO2 96%   BMI 32.61 kg/m  Constitutional: No distress . Vital signs reviewed. HEENT: EOMI, oral membranes moist Neck: supple Cardiovascular: RRR without murmur. No JVD    Respiratory: CTA Bilaterally without wheezes or rales. Normal effort    GI: BS +, non-tender, non-distended  Musculoskeletal: He exhibits no edema or tenderness. Neurological: He is alert and oriented 3 Follows full commands.  Fair awareness of deficits.  Motor: LUE 0/5 remains LLE 1/5 hip add, 0/5 distally (stable exam) no resting tone Skin: Skin is warm and dry.  Psychiatric: He has a normal mood and affect. His behavior is normal.   Assessment/Plan: 1. 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 Physiatrist is providing close team supervision and 24 hour management of active medical problems listed below. Physiatrist and rehab team continue to assess  barriers to discharge/monitor patient progress toward functional and medical goals.  Function:  Bathing Bathing position   Position: Shower  Bathing parts Body parts bathed by patient: Chest, Abdomen, Right upper leg, Left upper leg, Front perineal area, Left arm Body parts bathed by helper: Right arm, Buttocks, Right lower leg, Left lower leg  Bathing assist Assist Level: Touching or steadying assistance(Pt > 75%)      Upper Body Dressing/Undressing Upper body dressing   What is the patient wearing?: Pull over shirt/dress     Pull over shirt/dress - Perfomed by patient: Thread/unthread right sleeve, Put head through opening Pull over shirt/dress - Perfomed by helper: Thread/unthread left sleeve, Pull shirt over trunk        Upper body assist Assist Level: Touching or steadying assistance(Pt > 75%)      Lower Body Dressing/Undressing Lower body dressing   What is the patient wearing?: Pants, Socks, Shoes   Underwear - Performed by helper: Thread/unthread right underwear leg, Thread/unthread left underwear leg, Pull underwear up/down   Pants- Performed by helper: Thread/unthread right pants leg, Thread/unthread left pants leg, Pull pants up/down   Non-skid slipper socks- Performed by helper: Don/doff right sock, Don/doff left sock   Socks - Performed by helper: Don/doff left sock, Don/doff right sock   Shoes - Performed by helper: Don/doff left shoe, Don/doff right shoe          Lower body assist Assist for lower body  dressing: Touching or steadying assistance (Pt > 75%)      Toileting Toileting Toileting activity did not occur: No continent bowel/bladder event   Toileting steps completed by helper: Adjust clothing prior to toileting, Performs perineal hygiene, Adjust clothing after toileting    Toileting assist Assist level: Two helpers   Transfers Chair/bed transfer   Chair/bed transfer method: Stand pivot Chair/bed transfer assist level: Maximal assist (Pt 25  - 49%/lift and lower) Chair/bed transfer assistive device: Armrests Mechanical lift: Stedy   Locomotion Ambulation     Max distance: 30 ft Assist level: Maximal assist (Pt 25 - 49%)   Wheelchair   Type: Manual Max wheelchair distance: 150' Assist Level: Supervision or verbal cues  Cognition Comprehension Comprehension assist level: Follows basic conversation/direction with no assist  Expression Expression assist level: Expresses basic needs/ideas: With no assist  Social Interaction Social Interaction assist level: Interacts appropriately with others with medication or extra time (anti-anxiety, antidepressant).  Problem Solving Problem solving assist level: Solves basic problems with no assist  Memory Memory assist level: More than reasonable amount of time    Medical Problem List and Plan:  1. 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 Aspirin and Plavix x3 weeks then aspirin alone will plan to D/C Plavix in ~1wk                            2. DVT Prophylaxis/Anticoagulation: Subcutaneous Lovenox  3. Pain Management: Tylenol as needed  4. Mood: Provide emotional support  5. Neuropsych: This patient is capable of making decisions on his own behalf.  6. Skin/Wound Care: Routine skin checks  7. Fluids/Electrolytes/Nutrition: Routine in and outs   BMP within acceptable range on 6/3 8. CAD with CABG. Continue aspirin and Plavix. No chest pain or shortness of breath  9. Diabetes mellitus. Hemoglobin A1c 6.0. SSI. Check blood sugars before meals and at bedtime Home meds  Glucophage 1000 mg twice daily, Amaryl 4 mg daily, Lantus insulin 10 units nightly, Tradjenta 5 mg daily  CBG (last 3)  Recent Labs    08/23/17 1739 08/23/17 2105 08/24/17 0630  GLUCAP 82 155* 89   D/Ced amaryl , cont Tradjenta 5mg , off Lantus, controlled but on low side, meal intake 50-70%, reduce metformin to 500mg  10. Dysphagia.  Dysphasia #2 nectar liquids. Follow-up speech therapy  11. Hypertension. Cozaar 25 mg daily, Toprol 50 mg daily  Vitals:   08/23/17 2009 08/24/17 0632  BP: 135/62 128/64  Pulse: 75 78  Resp: 18 18  Temp: 98.3 F (36.8 C) 97.9 F (36.6 C)  SpO2: 98% 96%  Controlled 6/13 12. Diastolic congestive heart failure. Monitor for any signs of fluid overload. Patient was taking Lasix 40 mg daily if needed for fluid retention prior to admission.    Daily weights ordered--stable Filed Weights   08/21/17 0515 08/22/17 0521 08/23/17 0517  Weight: 84.1 kg (185 lb 6.5 oz) 85.7 kg (188 lb 15 oz) 83.5 kg (184 lb 1.4 oz)   13. History of seizure disorder. Depakote 500 mg daily.  14. Hyperlipidemia. Lipitor  15. History of alcohol use. Monitor for withdrawal  16.  Elevated Creat need to monitor given pt on Metformin 17.  Constipation-improved, reduced senna to 1po BID,Continent of stool continue Bisacodyl supp prn   LOS (Days) 13 A FACE TO FACE EVALUATION WAS PERFORMED  Charlett Blake 08/24/2017 7:23 AM

## 2017-08-24 NOTE — Progress Notes (Signed)
Occupational Therapy Session Note  Patient Details  Name: Fred [name] MRN: [id number] Date of Birth: 12/14/1935  Today's Date: 08/24/2017 OT Individual Time: 1420-1535 OT Individual Time Calculation (min): 75 min   Short Term Goals: Week 2:  OT Short Term Goal 1 (Week 2): Pt will recall hemi dressing techniques with min questioning cues OT Short Term Goal 2 (Week 2): Pt will complete 1/4 LB dressing steps  OT Short Term Goal 3 (Week 2): Pt will complete shower transfer using grab bars with max A OT Short Term Goal 4 (Week 2): Pt will complete stand at sink for up to 1 minute with mod A   Skilled Therapeutic Interventions/Progress Updates:    Pt greeted semi-reclined in bed and agreeable to OT. Sit<>stand in [place] with min A to transfer into wc. Pt brought to the sink in wc for dressing tasks. Worked on sit<>stand and standing balance within modified dressing tasks. Pt needed mod A to come to standing, then mod/mox A to maintain standing balance with increased task demand of pulling up pants. Pt needed facilitation at L hip/knee to achieve extension with some L knee hyperextension noted. Pt needed instructional cues to recall hemi-dressing techniques to thread L UE through sleeve. Facilitated L NMR in standing with weight bearing on sink. Applied 1:1 NMES to CH1 supraspinatus and middle deltoid to help approximate shoulder joint, and Ch 2 wrist extensors.  Ratio 1:1 Rate 35 pps Waveform- Asymmetric Ramp 1.0 Pulse 300 CH1 Intensity- 18  Duration -  15  CH1 Intensity- 25 Duration -  15  Sit<>stands at parallel bars with mod A. Facilitated weight bearing through LUE when standing at bars and facilitated shoulder adduction/abduction with activation of pectoral muscles. Pt returned to room and left seated in wc with safety belt on and needs met.   Therapy Documentation Precautions:  Precautions Precautions: Fall Precaution Comments: L sided weakness Restrictions Weight Bearing  Restrictions: No Pain:  none/denies pain ADL: ADL ADL Comments: see functional navigator  See Function Navigator for Current Functional Status.   Therapy/Group: Individual Therapy   S  08/24/2017, 3:05 PM 

## 2017-08-24 NOTE — Progress Notes (Signed)
Physical Therapy Session Note  Patient Details  Name: Fred [name] MRN: [id number] Date of Birth: 01-13-1936  Today's Date: 08/24/2017 PT Individual Time: 8719-5974 AND 7185-5015 PT Individual Time Calculation (min): 57 min AND 44 min  Short Term Goals: Week 2:  PT Short Term Goal 1 (Week 2): Pt will transfer bed<>chair w/ mod assist PT Short Term Goal 2 (Week 2): Pt will demonstrate trace L quad activation during functional mobility 25% of the time PT Short Term Goal 3 (Week 2): Pt will ambulate 15' w/ mod assist  PT Short Term Goal 4 (Week 2): Pt will maintain dynamic sitting balance w/ min assist   Skilled Therapeutic Interventions/Progress Updates:   Session 1:  Pt in supine and agreeable to therapy, denies pain but requesting to toilet. Transferred to EOB w/ min assist, total assist to don shoes for time management and transferred to w/c via squat pivot w/ mod assist. Provided set-up assist to use urinal to void before leaving room. Pt self-propelled w/c to/from room via R hemi technique to work on independence w/ locomotion. Supervision overall, much improved from previous sessions with this therapist. Performed pregait tasks at R rail including R forward and backward stepping and lateral weight shifting w/ tactile cues for L quad activation. Ambulated 30' w/ max assist overall. Pt able to initiate L hip flexor activation during swing, but required total assist for step placement and swing through. Mod manual assist for L knee extension in stance. Transferred to supine on mat w/ mod assist and worked on R NMR/muscle activation in [place] and supine. Performed gravity eliminated L knee extension and flexion w/ tactile cues for quad and hamstring activation, 5x10 reps, and supine active assisted L knee extension and flexion, 3x10 reps. Returned to room in w/c, ended session in w/c, call bell within reach and all needs met.   Session 2:  Pt in w/c and agreeable to therapy, denies pain.  Total assist w/c transport to/from therapy gym. Performed NuStep 12 min @ level 3 for reciprocal movement pattern and LLE muscle activation. Manual assist majority of time to maintain neutral LLE alignment and tactile cues for L quad activation. Worked on L NMR in stance at R rail including lateral weight shifts and RLE forward and backward stepping. Ambulated 30' and 15' at rail w/ max assist overall and distant w/c follow. Mod-max assist for L quad engagement in single leg stance and total assist for LLE foot placement. Returned to room and ended session in w/c and in care of family, all needs met.   Therapy Documentation Precautions:  Precautions Precautions: Fall Precaution Comments: L sided weakness Restrictions Weight Bearing Restrictions: No Vital Signs: Therapy Vitals Temp: 97.9 F (36.6 C) Pulse Rate: 76 Resp: 18 BP: 124/62 Patient Position (if appropriate): Lying Oxygen Therapy SpO2: 96 % O2 Device: Room Air  See Function Navigator for Current Functional Status.   Therapy/Group: Individual Therapy  Tequila Rottmann K Arnette 08/24/2017, 9:04 AM

## 2017-08-25 ENCOUNTER — Inpatient Hospital Stay (HOSPITAL_COMMUNITY): Payer: Medicare Other | Admitting: Physical Therapy

## 2017-08-25 ENCOUNTER — Inpatient Hospital Stay (HOSPITAL_COMMUNITY): Payer: Medicare Other | Admitting: Occupational Therapy

## 2017-08-25 LAB — CREATININE, SERUM
CREATININE: 1.12 mg/dL (ref 0.61–1.24)
GFR calc Af Amer: >60 mL/min (ref >60)
GFR calc non Af Amer: >60 mL/min (ref >60)

## 2017-08-25 LAB — GLUCOSE, CAPILLARY
Glucose-Capillary: 101 mg/dL — ABNORMAL HIGH (ref 65–99)
Glucose-Capillary: 143 mg/dL — ABNORMAL HIGH (ref 65–99)
Glucose-Capillary: 167 mg/dL — ABNORMAL HIGH (ref 65–99)

## 2017-08-25 NOTE — Progress Notes (Signed)
Occupational Therapy Session Note  Patient Details  Name: Fred [name] MRN: [id number] Date of Birth: 22-Nov-1935  Today's Date: 08/25/2017 OT Individual Time: 0802-0900 OT Individual Time Calculation (min): 58 min    Short Term Goals: Week 2:  OT Short Term Goal 1 (Week 2): Pt will recall hemi dressing techniques with min questioning cues OT Short Term Goal 2 (Week 2): Pt will complete 1/4 LB dressing steps  OT Short Term Goal 3 (Week 2): Pt will complete shower transfer using grab bars with max A OT Short Term Goal 4 (Week 2): Pt will complete stand at sink for up to 1 minute with mod A   Skilled Therapeutic Interventions/Progress Updates:    Pt greeted semi-reclined in bed and agreeable to OT treatment session. Pt reported need to go to the bathroom. Stedy used to transfer pt bed>toilet>wc with min A sit<>stand. Pt needed assistance to manage clothing standing in [place]. Pt with successful BM and voided bladder. Max A peri-care in standing in [place]. Had pt attempt to wash buttocks in standing and he was able to do so with mod/max A to maintain standing balance 2/2 LOB to L. Bathing/dressing completed wc level at the sink. L UE NMR within bathing tasks with hand over hand A. Pt demonstrated good use of reacher today and was able to maintain trunk control and reach forward with reacher to thread R LE into pants. Sit<>stands with mod A with increased LLE hyperextension noted. Min verbal cues to recall hemi dressing techniques for UB, but was able to complete 3/4 UB dressing steps. Pt left seated in wc at end of session with safety belt on and call bell in reach.   Therapy Documentation Precautions:  Precautions Precautions: Fall Precaution Comments: L sided weakness Restrictions Weight Bearing Restrictions: No Pain:  none/denies pain ADL: ADL ADL Comments: see functional navigator  See Function Navigator for Current Functional Status.   Therapy/Group: Individual  Therapy  Valma Cava 08/25/2017, 8:19 AM

## 2017-08-25 NOTE — Progress Notes (Signed)
Physical Therapy Session Note  Patient Details  Name: Fred [name] MRN: [id number] Date of Birth: 12-Jul-1935  Today's Date: 08/25/2017 PT Individual Time: 1300-1420 PT Individual Time Calculation (min): 80 min   Short Term Goals: Week 2:  PT Short Term Goal 1 (Week 2): Pt will transfer bed<>chair w/ mod assist PT Short Term Goal 2 (Week 2): Pt will demonstrate trace L quad activation during functional mobility 25% of the time PT Short Term Goal 3 (Week 2): Pt will ambulate 15' w/ mod assist  PT Short Term Goal 4 (Week 2): Pt will maintain dynamic sitting balance w/ min assist   Skilled Therapeutic Interventions/Progress Updates:   Pt in w/c and agreeable to therapy, denies pain. Pt self-propelled w/c to/from therapy gym to work on independence w/ locomotion, supervision overall. Session focused on NMR including midline orientation, facilitating decreased L pushing, and LLE muscle activation. Performed multiple sit<>stands w/ visual and UE reaching cues to R anterior side, and manual facilitation of L quad activation. Worked on postural control in static stance in 30-60 sec bouts w/ hemiwalker providing RUE support. Max manual and verbal assist to reach midline w/ tactile cues for neutral trunk rotation, even shoulders, and neutral pelvic rotation. Performed lateral weight shifts in static stance w/ mod manual cues to maintain L knee extension. Worked on L forward and backward stepping to facilitate increased WB on R side, max assist to facilitate R weight shift and move LLE. Performed same static standing tasks at rail in hallway w/ increased success 2/2 increased stability. Performed dynamic sitting tasks emphasizing R WB and righting reactions when reaching to edge of BOS in all planes. Min-mod assist to correct 2-3 LOB to L side. Returned to room in w/c and ended session in w/c, call bell within reach and all needs met.   Therapy Documentation Precautions:  Precautions Precautions:  Fall Precaution Comments: L sided weakness Restrictions Weight Bearing Restrictions: No  See Function Navigator for Current Functional Status.   Therapy/Group: Individual Therapy  [name] K Arnette 08/25/2017, 2:29 PM

## 2017-08-25 NOTE — Progress Notes (Signed)
Physical Therapy Session Note  Patient Details  Name: Fred [name] MRN: [id number] Date of Birth: Apr 11, 1935  Today's Date: 08/25/2017 PT Individual Time:  [phone]   60 min   Short Term Goals: Week 1:  PT Short Term Goal 1 (Week 1): Pt will perform squat pivot transfer to bed with mod assist  PT Short Term Goal 1 - Progress (Week 1): Progressing toward goal PT Short Term Goal 2 (Week 1): Pt will propell WC x 122f with supervision assist  PT Short Term Goal 2 - Progress (Week 1): Met PT Short Term Goal 3 (Week 1): Pt will ambuate 138fwith max assist of 1.  PT Short Term Goal 3 - Progress (Week 1): Met PT Short Term Goal 4 (Week 1): Pt will maintian standing balance with mod assist and LRAD  PT Short Term Goal 4 - Progress (Week 1): Progressing toward goal PT Short Term Goal 5 (Week 1): pt will perform sit<>supine transfers with min assist from PT  PT Short Term Goal 5 - Progress (Week 1): Progressing toward goal Week 2:  PT Short Term Goal 1 (Week 2): Pt will transfer bed<>chair w/ mod assist PT Short Term Goal 2 (Week 2): Pt will demonstrate trace L quad activation during functional mobility 25% of the time PT Short Term Goal 3 (Week 2): Pt will ambulate 15' w/ mod assist  PT Short Term Goal 4 (Week 2): Pt will maintain dynamic sitting balance w/ min assist   Skilled Therapeutic Interventions/Progress Updates:   Pt received sitting in WC and agreeable to PT  PT instructed pt in seated and standing trunkal Neuromotor re-education with lateral reach to the R to force WB through the RLE, improve midline orientation, and facilitate use of the LLE into extension to prevent L lateral LOB. Reaching in squat to the R, 2 x 20 with facilitation from PT to extend the LLE.   Standing balance without UE support 4 x 1 minute with max assist to faciliate R lateral weight shift. PT also instructed pt in dynamic standing balance with RUE support on HW to initiate pre-gait stepping to activate the  LLE and improve WB through the R.   Gait training with HW x 1525fith max assist +2 for WC. PT required to facilitate R lateral weight shift and advancement of LLE. Mild activation of L quads and glutes in stance on the L.   Patient returned to room and [facility] in reach and all needs met.        Therapy Documentation Precautions:  Precautions Precautions: Fall Precaution Comments: L sided weakness Restrictions Weight Bearing Restrictions: No Vital Signs: Therapy Vitals Temp: 98 F (36.7 C) Temp Source: Oral Pulse Rate: 74 Resp: 18 BP: (!) 127/53 Patient Position (if appropriate): Lying Oxygen Therapy SpO2: 97 % O2 Device: Room Air     See Function Navigator for Current Functional Status.   Therapy/Group: Individual Therapy  AusLorie Phenix14/2019, 9:30 AM

## 2017-08-25 NOTE — Plan of Care (Signed)
  Problem: RH SAFETY Goal: RH STG ADHERE TO SAFETY PRECAUTIONS W/ASSISTANCE/DEVICE Description STG Adhere to Safety Precautions With  Mod cues/ supervision Assistance/Device.   Outcome: Progressing  Call light within reach, proper footwear,

## 2017-08-25 NOTE — Progress Notes (Signed)
[place] PHYSICAL MEDICINE & REHABILITATION     PROGRESS NOTE  Subjective/Complaints:   Pt trying to move the Left hand and arm   ROS:No CP, SOB, bowel or bladder issues Objective: Vital Signs: Blood pressure 114/63, pulse 74, temperature 98 F (36.7 C), temperature source Oral, resp. rate 15, weight 86.2 kg (190 lb 1.6 oz), SpO2 96 %. No results found. No results for input(s): WBC, HGB, HCT, PLT in the last 72 hours. No results for input(s): NA, K, CL, GLUCOSE, BUN, CREATININE, CALCIUM in the last 72 hours.  Invalid input(s): CO CBG (last 3)  Recent Labs    08/24/17 1638 08/24/17 2035 08/25/17 0654  GLUCAP 147* 154* 101*    Wt Readings from Last 3 Encounters:  08/24/17 86.2 kg (190 lb 1.6 oz)  08/10/17 84.4 kg (186 lb)  07/18/17 87.1 kg (192 lb)    Physical Exam:  BP 114/63 (BP Location: Right Arm)   Pulse 74   Temp 98 F (36.7 C) (Oral)   Resp 15   Wt 86.2 kg (190 lb 1.6 oz)   SpO2 96%   BMI 33.67 kg/m  Constitutional: No distress . Vital signs reviewed. HEENT: EOMI, oral membranes moist Neck: supple Cardiovascular: RRR without murmur. No JVD    Respiratory: CTA Bilaterally without wheezes or rales. Normal effort    GI: BS +, non-tender, non-distended  Musculoskeletal: He exhibits no edema or tenderness. Neurological: He is alert and oriented 3 Follows full commands.  Fair awareness of deficits.  Motor: LUE 0/5 remains LLE 1/5 hip add, 0/5 distally (stable exam) no resting tone Skin: Skin is warm and dry.  Psychiatric: He has a normal mood and affect. His behavior is normal.   Assessment/Plan: 1. 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 Physiatrist is providing close team supervision and 24 hour management of active medical problems listed below. Physiatrist and rehab team continue to assess barriers to  discharge/monitor patient progress toward functional and medical goals.  Function:  Bathing Bathing position   Position: Shower  Bathing parts Body parts bathed by patient: Chest, Abdomen, Right upper leg, Left upper leg, Front perineal area, Left arm Body parts bathed by helper: Right arm, Buttocks, Right lower leg, Left lower leg  Bathing assist Assist Level: Touching or steadying assistance(Pt > 75%)      Upper Body Dressing/Undressing Upper body dressing   What is the patient wearing?: Pull over shirt/dress     Pull over shirt/dress - Perfomed by patient: Thread/unthread right sleeve, Put head through opening Pull over shirt/dress - Perfomed by helper: Thread/unthread left sleeve, Pull shirt over trunk        Upper body assist Assist Level: Touching or steadying assistance(Pt > 75%)      Lower Body Dressing/Undressing Lower body dressing   What is the patient wearing?: Pants, Socks, Shoes   Underwear - Performed by helper: Thread/unthread right underwear leg, Thread/unthread left underwear leg, Pull underwear up/down   Pants- Performed by helper: Thread/unthread right pants leg, Thread/unthread left pants leg, Pull pants up/down   Non-skid slipper socks- Performed by helper: Don/doff right sock, Don/doff left sock   Socks - Performed by helper: Don/doff left sock, Don/doff right sock   Shoes - Performed by helper: Don/doff left shoe, Don/doff right shoe          Lower body assist Assist for lower body dressing: Touching or steadying  assistance (Pt > 75%)      Toileting Toileting Toileting activity did not occur: No continent bowel/bladder event   Toileting steps completed by helper: Adjust clothing prior to toileting, Performs perineal hygiene, Adjust clothing after toileting    Toileting assist Assist level: Two helpers   Transfers Chair/bed transfer   Chair/bed transfer method: Squat pivot Chair/bed transfer assist level: Maximal assist (Pt 25 - 49%/lift  and lower) Chair/bed transfer assistive device: Armrests Mechanical lift: Stedy   Locomotion Ambulation     Max distance: 30 ft Assist level: Maximal assist (Pt 25 - 49%)   Wheelchair   Type: Manual Max wheelchair distance: 150' Assist Level: Supervision or verbal cues  Cognition Comprehension Comprehension assist level: Follows basic conversation/direction with extra time/assistive device  Expression Expression assist level: Expresses basic needs/ideas: With extra time/assistive device  Social Interaction Social Interaction assist level: Interacts appropriately with others with medication or extra time (anti-anxiety, antidepressant).  Problem Solving Problem solving assist level: Solves complex problems: With extra time  Memory Memory assist level: More than reasonable amount of time    Medical Problem List and Plan:  1. 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 Aspirin and Plavix x3 weeks then aspirin alone will plan to D/C Plavix~6/20                           2. DVT Prophylaxis/Anticoagulation: Subcutaneous Lovenox  3. Pain Management: Tylenol as needed  4. Mood: Provide emotional support  5. Neuropsych: This patient is capable of making decisions on his own behalf.  6. Skin/Wound Care: Routine skin checks  7. Fluids/Electrolytes/Nutrition: Routine in and outs   BMP within acceptable range on 6/3 8. CAD with CABG. Continue aspirin and Plavix. No chest pain or shortness of breath  9. Diabetes mellitus. Hemoglobin A1c 6.0. SSI. Check blood sugars before meals and at bedtime Home meds  Glucophage 1000 mg twice daily, Amaryl 4 mg daily, Lantus insulin 10 units nightly, Tradjenta 5 mg daily  CBG (last 3)  Recent Labs    08/24/17 1638 08/24/17 2035 08/25/17 0654  GLUCAP 147* 154* 101*   D/Ced amaryl , cont Tradjenta 5mg , off Lantus, controlled but on low side, meal intake 50-70%, reduce metformin to  500mg  10. Dysphagia. Dysphasia #2 nectar liquids. Follow-up speech therapy  11. Hypertension. Cozaar 25 mg daily, Toprol 50 mg daily  Vitals:   08/24/17 2234 08/25/17 0649  BP: 128/64 114/63  Pulse: 76 74  Resp: (!) 21 15  Temp: 98.4 F (36.9 C) 98 F (36.7 C)  SpO2: 99% 96%  Controlled 6/14 12. Diastolic congestive heart failure. Monitor for any signs of fluid overload. Patient was taking Lasix 40 mg daily if needed for fluid retention prior to admission.    Daily weights ordered--stable Filed Weights   08/22/17 0521 08/23/17 0517 08/24/17 1855  Weight: 85.7 kg (188 lb 15 oz) 83.5 kg (184 lb 1.4 oz) 86.2 kg (190 lb 1.6 oz)   13. History of seizure disorder. Depakote 500 mg daily.  14. Hyperlipidemia. Lipitor  15. History of alcohol use. Monitor for withdrawal  16.  Elevated Creat need to monitor given pt on Metformin 17.  Constipation-improved, reduced senna to 1po BID,Continent of stool continue Bisacodyl supp prn   LOS (Days) 14 A FACE TO FACE EVALUATION WAS PERFORMED  Charlett Blake 08/25/2017 7:22 AM

## 2017-08-26 ENCOUNTER — Inpatient Hospital Stay (HOSPITAL_COMMUNITY): Payer: Medicare Other | Admitting: Occupational Therapy

## 2017-08-26 LAB — GLUCOSE, CAPILLARY
GLUCOSE-CAPILLARY: 150 mg/dL — AB (ref 65–99)
Glucose-Capillary: 103 mg/dL — ABNORMAL HIGH (ref 65–99)
Glucose-Capillary: 141 mg/dL — ABNORMAL HIGH (ref 65–99)
Glucose-Capillary: 217 mg/dL — ABNORMAL HIGH (ref 65–99)

## 2017-08-26 NOTE — Progress Notes (Signed)
Occupational Therapy Session Note  Patient Details  Name: Fred [name] MRN: [id number] Date of Birth: May 18, 1935  Today's Date: 08/26/2017 OT Group Time: 1100-1200 OT Group Time Calculation (min): 60 min   Skilled Therapeutic Interventions/Progress Updates:    Pt participated in therapeutic w/c level dance group with focus on UE/LE strengthening, activity tolerance, and social participation for carryover during self care tasks. Pt was guided through various dance-based exercises involving UB/LB and trunk. Instructed him on AAROM adaptations for Lt NMR. Spouse and dtr present during session and danced with him. Pt crying cheerfully at these times. He also held hands and danced with spouse when w/c was positioned to face her during one of their favorite songs. At end of session, dtr escorted him back to room.   Therapy Documentation Precautions:  Precautions Precautions: Fall Precaution Comments: L sided weakness Restrictions Weight Bearing Restrictions: No Vital Signs: Therapy Vitals Temp: 97.6 F (36.4 C) Temp Source: Oral Pulse Rate: 75 Resp: 18 BP: 130/69 Patient Position (if appropriate): Sitting Oxygen Therapy SpO2: 98 % O2 Device: Room Air ADL: ADL ADL Comments: see functional navigator     See Function Navigator for Current Functional Status.   Therapy/Group: Group Therapy  Aleli Navedo A Mykela Mewborn 08/26/2017, 4:21 PM

## 2017-08-26 NOTE — Progress Notes (Signed)
Patient ID: Fred [name], male   DOB: April 24, 1935, 82 y.o.   MRN: [id number]   Fred [name] is a 82 y.o. male who is admitted for CIR with left-sided weakness secondary to a right internal capsule infarct  Past Medical History:  Diagnosis Date  . Atrial fibrillation ([place])    Post operative  . CAD (coronary artery disease)    s/p cabg  . CHF (congestive heart failure) ([place])   . Diverticulosis   . DJD (degenerative joint disease)   . DM (diabetes mellitus) ([place])   . History of anemia   . History of stroke   . Hyperlipidemia   . Hypertension   . 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 Peripheral vascular disease ([place])   . Polyneuropathy, diabetic ([place])   . Seizure disorder, complex partial ([place])   . Syncope and collapse      Subjective: No new complaints. No new problems.    Objective: Vital signs in last 24 hours: Temp:  [97.6 F (36.4 C)-98.4 F (36.9 C)] 98.4 F (36.9 C) (06/15 0609) Pulse Rate:  [66-90] 77 (06/15 0810) Resp:  [18-19] 18 (06/15 0609) BP: (110-123)/(62-64) 123/63 (06/15 0810) SpO2:  [96 %-98 %] 96 % (06/15 0810) Weight:  [180 lb 8.9 oz (81.9 kg)] 180 lb 8.9 oz (81.9 kg) (06/15 0609) Weight change: -9 lb 8.7 oz (-4.329 kg) Last BM Date: 08/23/17  Intake/Output from previous day: 06/14 0701 - 06/15 0700 In: 360 [P.O.:360] Out: -  Last cbgs: CBG (last 3)  Recent Labs    08/25/17 1143 08/25/17 1902 08/26/17 0620  GLUCAP 143* 167* 103*    Lab Results  Component Value Date   HGBA1C 6.0 (H) 08/10/2017    Patient Vitals for the past 24 hrs:  BP Temp Temp src Pulse Resp SpO2 Weight  08/26/17 0810 123/63 - - 77 - 96 % -  08/26/17 0609 110/64 98.4 F (36.9 C) Oral 72 18 97 % 180 lb 8.9 oz (81.9 kg)  08/25/17 2332 123/62 97.6 F (36.4 C) Oral 66 19 98 % -  08/25/17 1700 120/64 98.4 F (36.9 C) Oral 90 - - -    Physical Exam General: No apparent distress   HEENT: Moist Lungs: Normal effort. Lungs clear to auscultation,  no crackles or wheezes. Cardiovascular: Regular rate and rhythm, no edema Abdomen: S/NT/ND; BS(+) Musculoskeletal:  unchanged Neurological: No new neurological deficits with dense left hemiparesis  Wounds: N/A    Skin: clear  Aging changes Mental state: Alert, oriented, cooperative    Lab Results: BMET    Component Value Date/Time   NA 142 08/14/2017 0645   K 4.9 08/14/2017 0645   CL 108 08/14/2017 0645   CO2 22 08/14/2017 0645   GLUCOSE 168 (H) 08/14/2017 0645   BUN 27 (H) 08/14/2017 0645   CREATININE 1.12 08/25/2017 0718   CREATININE 1.18 11/09/2016 1014   CALCIUM 9.4 08/14/2017 0645   GFRNONAA >60 08/25/2017 0718   GFRNONAA 58 (L) 11/09/2016 1014   GFRAA >60 08/25/2017 0718   GFRAA 67 11/09/2016 1014   CBC    Component Value Date/Time   WBC 9.4 08/14/2017 0645   RBC 4.27 08/14/2017 0645   HGB 13.8 08/14/2017 0645   HCT 41.8 08/14/2017 0645   PLT 211 08/14/2017 0645   MCV 97.9 08/14/2017 0645   MCH 32.3 08/14/2017 0645   MCHC 33.0 08/14/2017 0645   RDW 12.9 08/14/2017 0645   LYMPHSABS 2.5 08/14/2017 0645   MONOABS  1.3 (H) 08/14/2017 0645   EOSABS 0.1 08/14/2017 0645   BASOSABS 0.1 08/14/2017 0645   Medications: I have reviewed the patient's current medications.  Assessment/Plan:  Functional deficits secondary to a right posterior limb internal capsule infarct with dense left hemiparesis.  Continue CIR.  Continue DAPT until 08/31/2017 when Plavix will be discontinued Coronary artery disease.  Status post CABG Diabetes mellitus.  Tight glycemic control.  Lantus and glimepiride discontinued Essential hypertension.  No change in therapy blood pressure well controlled Dyslipidemia continue statin therapy History of seizure disorder stable.  Continue Depakote     Length of stay, days: 15  Marletta Lor , MD 08/26/2017, 10:38 AM

## 2017-08-26 NOTE — Plan of Care (Signed)
  Problem: RH KNOWLEDGE DEFICIT Goal: RH STG INCREASE KNOWLEDGE OF DIABETES Description Pt will be able to explain how to monitor and manage diabetes/diet/medications using notes/resources independently  Outcome: Not Progressing  Continue to educate on diabetes

## 2017-08-27 LAB — GLUCOSE, CAPILLARY
GLUCOSE-CAPILLARY: 106 mg/dL — AB (ref 65–99)
GLUCOSE-CAPILLARY: 151 mg/dL — AB (ref 65–99)
GLUCOSE-CAPILLARY: 129 mg/dL — AB (ref 65–99)
GLUCOSE-CAPILLARY: 134 mg/dL — AB (ref 65–99)

## 2017-08-27 NOTE — Progress Notes (Signed)
Social Work Patient ID: Fred [name], male   DOB: February 13, 1936, 82 y.o.   MRN: [id number]     [name], [name]  Social Worker    Patient Care Conference  Signed  Date of Service:  08/24/2017 10:52 AM          Signed           Show:Clear all [x] Manual[x] Template[] Copied  Added by: [x] Kennedie Pardoe, Gerline Legacy, LCSW   [] Hover for details   Inpatient RehabilitationTeam Conference and Plan of Care Update Date: 08/23/2017   Time: 11:20 AM      Patient Name: Fred [name]      Medical Record Number: [id number]  Date of Birth: 1935/10/02 Sex: Male         Room/Bed: 4W14C/4W14C-01 Payor Info: Payor: MEDICARE / Plan: MEDICARE PART A AND B / Product Type: *No Product type* /     Admitting Diagnosis: CVA  Admit Date/Time:  08/11/2017  5:19 PM Admission Comments: No comment available    Primary Diagnosis:  <principal problem not specified> Principal Problem: <principal problem not specified>       Patient Active Problem List    Diagnosis Date Noted  . Diabetes mellitus type 2 in obese ([place])    . Small vessel disease ([place]) 08/11/2017  . Left hemiparesis ([place])    . Benign essential hypertension with delivery    . CVA (cerebral vascular accident) ([place]) 08/10/2017  . 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 of localized onset, not intractable, without status epilepticus ([place]) 06/20/2017  . Primary osteoarthritis of both knees 05/11/2017  . Stroke (cerebrum) ([place]) 03/21/2017  . Type 2 diabetes mellitus with complication, with long-term current use of insulin ([place]) 03/27/2015  . Atherosclerosis of native coronary artery of native heart without angina pectoris 03/27/2015  . Palpitations 11/04/2014  . S/P CABG (coronary artery bypass graft) 11/04/2014  . Atrial fibrillation, unspecified    . History of stroke 09/04/2014  . Obesity (BMI 30-39.9) 11/29/2013  . History of gout 05/16/2013  . Congestive heart failure ([place]) 04/20/2009    . DYSPNEA 02/11/2009  . CARDIOVASCULAR FUNCTION STUDY, ABNORMAL 01/29/2009  . NECK PAIN 08/03/2007  . ANEMIA / OTHER 05/16/2007  . Coronary atherosclerosis 05/16/2007  . PAROXYSMAL ATRIAL FIBRILLATION 05/16/2007  . 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 Hyperlipidemia 03/30/2007  . DEGENERATIVE JOINT DISEASE 03/30/2007  . PAROTID LESION, UNSPECIFIED 09/29/2006  . Partial epilepsy with impairment of consciousness ([place]) 09/29/2006  . Diabetic polyneuropathy ([place]) 09/29/2006  . Essential hypertension 09/29/2006  . PERIPHERAL VASCULAR DISEASE 09/29/2006  . DIVERTICULOSIS 09/29/2006      Expected Discharge Date: Expected Discharge Date: 09/08/17   Team Members Present: Physician leading conference: Dr. Alysia Penna Social Worker Present: Alfonse Alpers, LCSW Nurse Present: Other (comment)(Denise Earnie Larsson, RN) PT Present: Michaelene Song, PT OT Present: Cherylynn Ridges, OT SLP Present: Weston Anna, SLP PPS Coordinator present : Daiva Nakayama, RN, CRRN       Current Status/Progress Goal Weekly Team Focus  Medical     Good diabetic control actually on the low side, hypertensive control is good, intake is fair  Maintain medical stability, reduce fall risk.  Manage diabetic medications to compensate for poor appetite   Bowel/Bladder     continent of bowel and bladder, LBM 08/20/17, has senna scheduled  continued continence  continue to assist as needed   Swallow/Nutrition/ Hydration     Regular texture with thin liquids, intermittent supervision  Mod I  Tolerance of diet upgrade  ADL's     Max A transfers, Max A overall BADLs, improved carryover of hemi techniques.  Min A  L NMR, NMES, transfer training, sit<>stands, decreased pushing, modified bathing/dressing   Mobility     max assist transfers and gait at rail 30', min-mod assit sit<>stands, supervision w/c mobillity  Min assist, gait short distances  LLE NMR, all functional mobility   Communication                Safety/Cognition/ Behavioral Observations   Supervison-Mod I  Supervision-Mod I  complex problem solving, recall   Pain     no c/o pain, has tylenol prn, has not used it  pain scale <2/10  continue to assess and treat as needed   Skin     no skin break down noted  no new break down  assess q shift     Rehab Goals Patient on target to meet rehab goals: Yes Rehab Goals Revised: none *See Care Plan and progress notes for long and short-term goals.      Barriers to Discharge   Current Status/Progress Possible Resolutions Date Resolved   Physician     Medical stability  Severe hemiparesis  Progressing towards goals  Continue rehabilitation program, see above      Nursing                 PT  Inaccessible home environment;Home environment access/layout  2 steps to enter home              OT                 SLP            SW              Discharge Planning/Teaching Needs:  Pt plans to return to his home with his wife.  Family members plans to pull together to be with pt/wife 24/7, with pt needing min A.  Family education to occur closer to d/c.   Team Discussion:  Pt's blood sugar controlled, a little on low side.  Blood pressure is good.  Not seeing a lot of extremity improvement.  Pt is continent of bowel and bladder with assistance and nursing staff is encouraging pt to eat.  Pt has a little more tone per OT, but still needs max A; with stedy he is min to mod A; mod to max A with ADLs, but has improved carryover with hemi technique - min A goals.  Pt is max overall with PT.  Gait at 30' with w/c follow, but needed total A to advance L leg. ST is trialing tray of regular textures and if that goes well, will d/c from [place].  Revisions to Treatment Plan:  none    Continued Need for Acute Rehabilitation Level of Care: The patient requires daily medical management by a physician with specialized training in physical medicine and rehabilitation for the following conditions: Daily direction  of a multidisciplinary physical rehabilitation program to ensure safe treatment while eliciting the highest outcome that is of practical value to the patient.: Yes Daily medical management of patient stability for increased activity during participation in an intensive rehabilitation regime.: Yes Daily analysis of laboratory values and/or radiology reports with any subsequent need for medication adjustment of medical intervention for : Neurological problems;Diabetes problems;Blood pressure problems   Rejina Odle, Silvestre Mesi 08/27/2017, 10:52 AM

## 2017-08-27 NOTE — Progress Notes (Signed)
Social Work Patient ID: Fred [name], male   DOB: [birth year], 82 y.o.   MRN: [id number]   CSW met with pt, pt's youngest dtr, and wife to update them on team conference discussion.  Pt is pleased with progress, but is glad to have more time until 09-08-17 to improve more.  Family still plans to be with pt, but dtr asked about insurance coverage for aide in the home.  CSW explained that Medicare does not pay for that, but that with pt being service connected with the [facility], family could look into any in home services for which pt may be eligible.  CSW gave pt's family 1-800 number to call to see.  Explained that CSW will arrange skilled [facility] through his Medicare and order DME.  CSW will continue to follow and assist as needed.

## 2017-08-27 NOTE — Progress Notes (Signed)
Patient ID: Fred [name], male   DOB: October 11, 1935, 82 y.o.   MRN: [id number]   Fred [name] is a 82 y.o. male admitted for CIR with functional deficits including left-sided weakness secondary to a right posterior limb internal capsule infarct  Past Medical History:  Diagnosis Date  . Atrial fibrillation ([place])    Post operative  . CAD (coronary artery disease)    s/p cabg  . CHF (congestive heart failure) ([place])   . Diverticulosis   . DJD (degenerative joint disease)   . DM (diabetes mellitus) ([place])   . History of anemia   . History of stroke   . Hyperlipidemia   . Hypertension   . 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 Peripheral vascular disease ([place])   . Polyneuropathy, diabetic ([place])   . Seizure disorder, complex partial ([place])   . Syncope and collapse       Subjective: No new complaints. No new problems. Slept well and had a very good night  Objective: Vital signs in last 24 hours: Temp:  [97.6 F (36.4 C)-97.9 F (36.6 C)] 97.8 F (36.6 C) (06/16 0541) Pulse Rate:  [68-75] 68 (06/16 0750) Resp:  [18-20] 18 (06/16 0541) BP: (105-130)/(60-69) 105/68 (06/16 0750) SpO2:  [96 %-100 %] 96 % (06/16 0541) Weight:  [190 lb 4.1 oz (86.3 kg)] 190 lb 4.1 oz (86.3 kg) (06/16 0541) Weight change: 9 lb 11.2 oz (4.4 kg) Last BM Date: 08/25/17  Intake/Output from previous day: 06/15 0701 - 06/16 0700 In: 540 [P.O.:540] Out: 700 [Urine:700] Last cbgs: CBG (last 3)  Recent Labs    08/26/17 1731 08/26/17 2120 08/27/17 0637  GLUCAP 217* 150* 106*   Patient Vitals for the past 24 hrs:  BP Temp Temp src Pulse Resp SpO2 Weight  08/27/17 0750 105/68 - - 68 - - -  08/27/17 0541 125/60 97.8 F (36.6 C) - 72 18 96 % 190 lb 4.1 oz (86.3 kg)  08/26/17 2118 123/66 97.9 F (36.6 C) Oral 75 20 100 % -  08/26/17 1431 130/69 97.6 F (36.4 C) Oral 75 18 98 % -     Physical Exam General: No apparent distress   HEENT: not dry Lungs: Normal effort. Lungs clear  to auscultation, no crackles or wheezes. Cardiovascular: Regular rate and rhythm, no edema Abdomen: S/NT/ND; BS(+) Musculoskeletal:  unchanged Neurological: No new neurological deficits with persistent dense left hemiparesis Wounds: N/A    Extremities.  No edema Mental state: Alert, oriented, cooperative    Lab Results: BMET    Component Value Date/Time   NA 142 08/14/2017 0645   K 4.9 08/14/2017 0645   CL 108 08/14/2017 0645   CO2 22 08/14/2017 0645   GLUCOSE 168 (H) 08/14/2017 0645   BUN 27 (H) 08/14/2017 0645   CREATININE 1.12 08/25/2017 0718   CREATININE 1.18 11/09/2016 1014   CALCIUM 9.4 08/14/2017 0645   GFRNONAA >60 08/25/2017 0718   GFRNONAA 58 (L) 11/09/2016 1014   GFRAA >60 08/25/2017 0718   GFRAA 67 11/09/2016 1014   CBC    Component Value Date/Time   WBC 9.4 08/14/2017 0645   RBC 4.27 08/14/2017 0645   HGB 13.8 08/14/2017 0645   HCT 41.8 08/14/2017 0645   PLT 211 08/14/2017 0645   MCV 97.9 08/14/2017 0645   MCH 32.3 08/14/2017 0645   MCHC 33.0 08/14/2017 0645   RDW 12.9 08/14/2017 0645   LYMPHSABS 2.5 08/14/2017 0645   MONOABS 1.3 (H) 08/14/2017 0645  EOSABS 0.1 08/14/2017 0645   BASOSABS 0.1 08/14/2017 0645    Medications: I have reviewed the patient's current medications.  Assessment/Plan:  Functional deficits following a right posterior limb internal capsule infarct.  Continue DAPT through 08/31/2017 and discontinue Plavix at that time Coronary artery disease.  Status post CABG stable Diabetes mellitus.  Blood sugars remain nicely controlled Essential hypertension.  Blood pressure low normal this a.m. we will continue to observe on present regimen     Length of stay, days: 16  Fred [name] , MD 08/27/2017, 9:58 AM

## 2017-08-27 NOTE — Patient Care Conference (Signed)
Inpatient RehabilitationTeam Conference and Plan of Care Update Date: 08/23/2017   Time: 11:20 AM    Patient Name: Fred [name]      Medical Record Number: [id number]  Date of Birth: Aug 01, 1935 Sex: Male         Room/Bed: 4W14C/4W14C-01 Payor Info: Payor: MEDICARE / Plan: MEDICARE PART A AND B / Product Type: *No Product type* /    Admitting Diagnosis: CVA  Admit Date/Time:  08/11/2017  5:19 PM Admission Comments: No comment available   Primary Diagnosis:  <principal problem not specified> Principal Problem: <principal problem not specified>  Patient Active Problem List   Diagnosis Date Noted  . Diabetes mellitus type 2 in obese ([place])   . Small vessel disease ([place]) 08/11/2017  . Left hemiparesis ([place])   . Benign essential hypertension with delivery   . CVA (cerebral vascular accident) ([place]) 08/10/2017  . 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 Primary osteoarthritis of both knees 05/11/2017  . Stroke (cerebrum) ([place]) 03/21/2017  . Type 2 diabetes mellitus with complication, with long-term current use of insulin ([place]) 03/27/2015  . Atherosclerosis of native coronary artery of native heart without angina pectoris 03/27/2015  . Palpitations 11/04/2014  . S/P CABG (coronary artery bypass graft) 11/04/2014  . Atrial fibrillation, unspecified   . History of stroke 09/04/2014  . Obesity (BMI 30-39.9) 11/29/2013  . History of gout 05/16/2013  . Congestive heart failure ([place]) 04/20/2009  . DYSPNEA 02/11/2009  . CARDIOVASCULAR FUNCTION STUDY, ABNORMAL 01/29/2009  . NECK PAIN 08/03/2007  . ANEMIA / OTHER 05/16/2007  . Coronary atherosclerosis 05/16/2007  . PAROXYSMAL ATRIAL FIBRILLATION 05/16/2007  . 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 Hyperlipidemia 03/30/2007  . DEGENERATIVE JOINT DISEASE 03/30/2007  . PAROTID LESION, UNSPECIFIED 09/29/2006  . Partial  epilepsy with impairment of consciousness ([place]) 09/29/2006  . Diabetic polyneuropathy ([place]) 09/29/2006  . Essential hypertension 09/29/2006  . PERIPHERAL VASCULAR DISEASE 09/29/2006  . DIVERTICULOSIS 09/29/2006    Expected Discharge Date: Expected Discharge Date: 09/08/17  Team Members Present: Physician leading conference: Dr. Alysia Penna Social Worker Present: Alfonse Alpers, LCSW Nurse Present: Other (comment)(Denise Earnie Larsson, RN) PT Present: Michaelene Song, PT OT Present: Cherylynn Ridges, OT SLP Present: Weston Anna, SLP PPS Coordinator present : Daiva Nakayama, RN, CRRN     Current Status/Progress Goal Weekly Team Focus  Medical   Good diabetic control actually on the low side, hypertensive control is good, intake is fair  Maintain medical stability, reduce fall risk.  Manage diabetic medications to compensate for poor appetite   Bowel/Bladder   continent of bowel and bladder, LBM 08/20/17, has senna scheduled  continued continence  continue to assist as needed   Swallow/Nutrition/ Hydration   Regular texture with thin liquids, intermittent supervision  Mod I  Tolerance of diet upgrade   ADL's   Max A transfers, Max A overall BADLs, improved carryover of hemi techniques.  Min A  L NMR, NMES, transfer training, sit<>stands, decreased pushing, modified bathing/dressing   Mobility   max assist transfers and gait at rail 30', min-mod assit sit<>stands, supervision w/c mobillity  Min assist, gait short distances  LLE NMR, all functional mobility   Communication             Safety/Cognition/ Behavioral Observations  Supervison-Mod I  Supervision-Mod I  complex problem solving, recall   Pain   no c/o pain, has tylenol prn, has not used it  pain scale <  2/10  continue to assess and treat as needed   Skin   no skin break down noted  no new break down  assess q shift    Rehab Goals Patient on target to meet rehab goals: Yes Rehab Goals Revised: none *See Care Plan and  progress notes for long and short-term goals.     Barriers to Discharge  Current Status/Progress Possible Resolutions Date Resolved   Physician    Medical stability  Severe hemiparesis  Progressing towards goals  Continue rehabilitation program, see above      Nursing                  PT  Inaccessible home environment;Home environment access/layout  2 steps to enter home              OT                  SLP                SW                Discharge Planning/Teaching Needs:  Pt plans to return to his home with his wife.  Family members plans to pull together to be with pt/wife 24/7, with pt needing min A.  Family education to occur closer to d/c.   Team Discussion:  Pt's blood sugar controlled, a little on low side.  Blood pressure is good.  Not seeing a lot of extremity improvement.  Pt is continent of bowel and bladder with assistance and nursing staff is encouraging pt to eat.  Pt has a little more tone per OT, but still needs max A; with stedy he is min to mod A; mod to max A with ADLs, but has improved carryover with hemi technique - min A goals.  Pt is max overall with PT.  Gait at 50' with w/c follow, but needed total A to advance L leg. ST is trialing tray of regular textures and if that goes well, will d/c from [place].  Revisions to Treatment Plan:  none    Continued Need for Acute Rehabilitation Level of Care: The patient requires daily medical management by a physician with specialized training in physical medicine and rehabilitation for the following conditions: Daily direction of a multidisciplinary physical rehabilitation program to ensure safe treatment while eliciting the highest outcome that is of practical value to the patient.: Yes Daily medical management of patient stability for increased activity during participation in an intensive rehabilitation regime.: Yes Daily analysis of laboratory values and/or radiology reports with any subsequent need for medication  adjustment of medical intervention for : Neurological problems;Diabetes problems;Blood pressure problems  [name], Silvestre Mesi 08/27/2017, 10:52 AM

## 2017-08-28 ENCOUNTER — Inpatient Hospital Stay (HOSPITAL_COMMUNITY): Payer: Medicare Other | Admitting: Occupational Therapy

## 2017-08-28 ENCOUNTER — Inpatient Hospital Stay (HOSPITAL_COMMUNITY): Payer: Medicare Other

## 2017-08-28 LAB — GLUCOSE, CAPILLARY
Glucose-Capillary: 105 mg/dL — ABNORMAL HIGH (ref 65–99)
Glucose-Capillary: 153 mg/dL — ABNORMAL HIGH (ref 65–99)
Glucose-Capillary: 123 mg/dL — ABNORMAL HIGH (ref 65–99)
Glucose-Capillary: 154 mg/dL — ABNORMAL HIGH (ref 65–99)

## 2017-08-28 NOTE — Progress Notes (Signed)
[facility] PHYSICAL MEDICINE & REHABILITATION     PROGRESS NOTE  Subjective/Complaints:   No new issues over the weekend according to the patient.  He does not note any movement in his left arm.  He does feel like he can move his leg a little bit better.   ROS:No CP, SOB, bowel or bladder issues Objective: Vital Signs: Blood pressure 116/60, pulse 75, temperature 98.6 F (37 C), temperature source Oral, resp. rate 18, weight 86.2 kg (190 lb 0.6 oz), SpO2 96 %. No results found. No results for input(s): WBC, HGB, HCT, PLT in the last 72 hours. No results for input(s): NA, K, CL, GLUCOSE, BUN, CREATININE, CALCIUM in the last 72 hours.  Invalid input(s): CO CBG (last 3)  Recent Labs    08/27/17 2106 08/28/17 0632 08/28/17 1131  GLUCAP 134* 105* 153*    Wt Readings from Last 3 Encounters:  08/28/17 86.2 kg (190 lb 0.6 oz)  08/10/17 84.4 kg (186 lb)  07/18/17 87.1 kg (192 lb)    Physical Exam:  BP 116/60 (BP Location: Right Arm)   Pulse 75   Temp 98.6 F (37 C) (Oral)   Resp 18   Wt 86.2 kg (190 lb 0.6 oz)   SpO2 96%   BMI 33.66 kg/m  Constitutional: No distress . Vital signs reviewed. HEENT: EOMI, oral membranes moist Neck: supple Cardiovascular: RRR without murmur. No JVD    Respiratory: CTA Bilaterally without wheezes or rales. Normal effort    GI: BS +, non-tender, non-distended  Musculoskeletal: He exhibits no edema or tenderness. Neurological: He is alert and oriented 3 Follows full commands.  Fair awareness of deficits.  Motor: LUE 0/5 remains LLE 1/5 hip add, 1/5 distally at the ankle dorsiflexors 0 at the ankle plantar flexors no resting tone Skin: Skin is warm and dry.  Psychiatric: He has a normal mood and affect. His behavior is normal.   Assessment/Plan: 1. 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 Physiatrist is providing close team supervision and 24 hour management of active medical problems listed below. Physiatrist and rehab team continue to assess barriers to discharge/monitor patient progress toward functional and medical goals.  Function:  Bathing Bathing position   Position: Wheelchair/chair at sink  Bathing parts Body parts bathed by patient: Chest, Abdomen, Right upper leg, Left upper leg, Front perineal area, Left arm Body parts bathed by helper: Right arm, Buttocks, Right lower leg, Left lower leg  Bathing assist Assist Level: Touching or steadying assistance(Pt > 75%)      Upper Body Dressing/Undressing Upper body dressing   What is the patient wearing?: Pull over shirt/dress     Pull over shirt/dress - Perfomed by patient: Thread/unthread left sleeve, Thread/unthread right sleeve, Put head through opening Pull over shirt/dress - Perfomed by helper: Pull shirt over trunk        Upper body assist Assist Level: Touching or steadying assistance(Pt > 75%)      Lower Body Dressing/Undressing Lower body dressing   What is the patient wearing?: Pants, Socks, Shoes   Underwear - Performed by helper: Thread/unthread right underwear leg, Thread/unthread left underwear leg, Pull underwear up/down Pants- Performed by patient: Thread/unthread right pants leg Pants- Performed by helper: Thread/unthread left pants leg, Pull pants up/down   Non-skid slipper socks- Performed by helper: Don/doff right sock, Don/doff left sock   Socks - Performed by helper: Don/doff  right sock, Don/doff left sock   Shoes - Performed by helper: Don/doff right shoe, Don/doff left shoe          Lower body assist Assist for lower body dressing: Touching or steadying assistance (Pt > 75%)      Toileting Toileting Toileting activity did not occur: No continent bowel/bladder event   Toileting steps completed by helper: Adjust clothing prior to toileting, Performs perineal hygiene,  Adjust clothing after toileting    Toileting assist Assist level: Touching or steadying assistance (Pt.75%)   Transfers Chair/bed transfer   Chair/bed transfer method: Stand pivot Chair/bed transfer assist level: Moderate assist (Pt 50 - 74%/lift or lower) Chair/bed transfer assistive device: Other(hemiwalker) Mechanical lift: Ecologist     Max distance: 15 ft Assist level: 2 helpers(pt max assist, +2 for w/c follow)   Wheelchair   Type: Manual Max wheelchair distance: 150' Assist Level: Supervision or verbal cues  Cognition Comprehension Comprehension assist level: Follows basic conversation/direction with extra time/assistive device  Expression Expression assist level: Expresses basic needs/ideas: With extra time/assistive device  Social Interaction Social Interaction assist level: Interacts appropriately with others with medication or extra time (anti-anxiety, antidepressant).  Problem Solving Problem solving assist level: Solves basic problems with no assist  Memory Memory assist level: More than reasonable amount of time    Medical Problem List and Plan:  1. 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 Aspirin and Plavix x3 weeks then aspirin alone will plan to D/C Plavix~6/20                           2. DVT Prophylaxis/Anticoagulation: Subcutaneous Lovenox  3. Pain Management: Tylenol as needed  4. Mood: Provide emotional support  5. Neuropsych: This patient is capable of making decisions on his own behalf.  6. Skin/Wound Care: Routine skin checks  7. Fluids/Electrolytes/Nutrition: Routine in and outs   BMP within acceptable range on 6/3 8. CAD with CABG. Continue aspirin and Plavix. No chest pain or shortness of breath  9. Diabetes mellitus. Hemoglobin A1c 6.0. SSI. Check blood sugars before meals and at bedtime Home meds  Glucophage 1000 mg twice daily, Amaryl 4 mg daily,  Lantus insulin 10 units nightly, Tradjenta 5 mg daily  CBG (last 3)  Recent Labs    08/27/17 2106 08/28/17 0632 08/28/17 1131  GLUCAP 134* 105* 153*   , cont Tradjenta 5mg ,metformin 500mg  BID 10. Dysphagia. Dysphasia #2 nectar liquids. Follow-up speech therapy  11. Hypertension. Cozaar 25 mg daily, Toprol 50 mg daily  Vitals:   08/27/17 2012 08/28/17 0420  BP: 124/68 116/60  Pulse: 70 75  Resp: 16 18  Temp: 98 F (36.7 C) 98.6 F (37 C)  SpO2: 99% 96%  Controlled 6/17 12. Diastolic congestive heart failure. Monitor for any signs of fluid overload. Patient was taking Lasix 40 mg daily if needed for fluid retention prior to admission.   No SOB   Daily weights ordered--some fluctuation Filed Weights   08/26/17 0609 08/27/17 0541 08/28/17 0420  Weight: 81.9 kg (180 lb 8.9 oz) 86.3 kg (190 lb 4.1 oz) 86.2 kg (190 lb 0.6 oz)   13. History of seizure disorder. Depakote 500 mg daily.  14. Hyperlipidemia. Lipitor  15. History of alcohol use. Monitor for withdrawal  16.  Elevated Creat need to monitor given pt on Metformin 17.  Constipation-improved, reduced senna to 1po BID,Continent of stool continue  Bisacodyl supp prn   LOS (Days) 17 A FACE TO FACE EVALUATION WAS PERFORMED  Charlett Blake 08/28/2017 12:40 PM

## 2017-08-28 NOTE — Progress Notes (Signed)
Physical Therapy Session Note  Patient Details  Name: Fred [name] MRN: [id number] Date of Birth: [birth year]  Today's Date: 08/28/2017 PT Individual Time: 3557-3220 and 1600-1700 PT Individual Time Calculation (min): 58 min and 60 min   Short Term Goals: Week 2:  PT Short Term Goal 1 (Week 2): Pt will transfer bed<>chair w/ mod assist PT Short Term Goal 2 (Week 2): Pt will demonstrate trace L quad activation during functional mobility 25% of the time PT Short Term Goal 3 (Week 2): Pt will ambulate 15' w/ mod assist  PT Short Term Goal 4 (Week 2): Pt will maintain dynamic sitting balance w/ min assist   Skilled Therapeutic Interventions/Progress Updates:    Session 1:  Pt seated in w/c upon PT arrival, agreeable to therapy tx and denies pain. Pt transported to the gym in w/c. Pt performed squat pivot transfer from w/c>mat with mod assist, verbal cues for techniques and manual facilitation for weightshifting. Pt performed sit<>stands this session with UE support on hemi walker with min-mod assist. In standing pt worked on L LE stance control performing terminal knee extension x 10 with tactile cues. Trial of L AFO this session with education on proper footwear. In standing with hemiwalker pt worked on pre-gait stepping forward/back in place with each LE, emphasis on L LE stance control. Pt performed blocked practice of stand pivot transfers with hemi-walker from mat<>w/c x 3 each direction with mod assist, verbal cues for techniques and manual facilitation for weightshifting. Pt ambulated x 15 ft with hemiwalker and max assist, +2 for w/c follow for safety, use of L AFO with shoe cover, pt able to activate quads during stance with tactile cues, activated L hip flexors during swing but is unable to advance LE. Pt transported back to room and left seated in w/c with needs in reach, QRB in place.   Session 2: Pt seated in w/c upon PT arrival, agreeable to therapy tx and denies pain. Pt propelled  w/c from room>gym with supervision using R hemi technique. Pt performed stand pivot transfer from w/c>mat with hemiwalker and mod assit, verbal cues for techniques and tactile cues for L knee extension. Pt transferred from sitting>supine with min assist. In supine pt performed 2 x 10 bridges with B LEs for neuro re-ed and strengthening. Pt transferred from supine>L sidelying with min assist. In sidelying position pt performed gravity eliminated exercises with use of powder board for L LE neuro re-ed including: active knee extension 2 x 10, active hip extension 2 x 10, active assisted hip flexion x 10, isometric quadriceps contraction 2 x 10 with verbal/tactile cues for techniques/muscle isolation. Pt transferred from L sidelying to sitting edge of mat with supervision. Pt performed sit<>stands from edge of mat with min-mod assist and use of hemi walker. In standing pt attempts to activate L hip flexors however unable to advance L LE for pre-gait tasks. Pt worked on L stance control while stepping in place with R LE and use of hemiwalker, tactile cues for quad activation. Pt performed stand pivot transfer back to w/c with hemi walker and mod assist, transported back to room and left seated in w/c with needs in reach.    Therapy Documentation Precautions:  Precautions Precautions: Fall Precaution Comments: L sided weakness Restrictions Weight Bearing Restrictions: No   See Function Navigator for Current Functional Status.   Therapy/Group: Individual Therapy  Netta Corrigan, PT, DPT 08/28/2017, 7:49 AM

## 2017-08-28 NOTE — Progress Notes (Addendum)
Occupational Therapy Weekly Progress Note  Patient Details  Name: Fred [name] MRN: [id number] Date of Birth: 1935/04/02  Beginning of progress report period: August 12, 2017 End of progress report period: August 28, 2017  Today's Date: 08/28/2017  Session 1 OT Individual Time: 0705-0800 OT Individual Time Calculation (min): 55 min  Session 2 OT Individual Time: 1300-1330 OT Individual Time Calculation (min): 30 min    Patient has met 4 of 4 short term goals.  Pt has made steady progress towards OT goals this week. He has demonstrated improved sit<>stand, but continues to need mod/max A for any dynamic task within BADLs 2/2 posterior and lateral LOB to the L. Pt has also been progressing within bathing/dressing tasks and has done well using long-handled AE such as reacher and LH sponge to increase independence with LB ADLs. Pt has developed more flexor tone in R UE but has not demonstrated any voluntary motor return. Continue current plan.  Patient continues to demonstrate the following deficits: muscle weakness, abnormal tone, unbalanced muscle activation, ataxia, decreased coordination and decreased motor planning and decreased sitting balance, decreased standing balance, decreased postural control, hemiplegia and decreased balance strategies and therefore will continue to benefit from skilled OT intervention to enhance overall performance with BADL and Reduce care partner burden.  Patient progressing toward long term goals..  Continue plan of care.  OT Short Term Goals Week 2:  OT Short Term Goal 1 (Week 2): Pt will recall hemi dressing techniques with min questioning cues OT Short Term Goal 1 - Progress (Week 2): Met OT Short Term Goal 2 (Week 2): Pt will complete 1/4 LB dressing steps  OT Short Term Goal 2 - Progress (Week 2): Met OT Short Term Goal 3 (Week 2): Pt will complete shower transfer using grab bars with max A OT Short Term Goal 3 - Progress (Week 2): Met OT Short Term  Goal 4 (Week 2): Pt will complete stand at sink for up to 1 minute with mod A  OT Short Term Goal 4 - Progress (Week 2): Met Week 3:  OT Short Term Goal 1 (Week 3): Pt will maintain standing balance with no more than Mod A while pulling pants up over hips OT Short Term Goal 2 (Week 3): Pt will complete shower transfer with mod A, OT Short Term Goal 3 (Week 3): Pt will demonstrate proficiency in self ROM to L UE  Skilled Therapeutic Interventions/Progress Updates:  Session 1   Pt greeted semi-reclined in bed and agreeable to OT treatment session. Stedy used to transfer pt in/out of shower and on/off commode with min A sit<>stand. Bathing completed with hand over hand A to L UE for NMR. Provided pt with LH sponge for increased access to LB bathing. Multiple sit<>stands with mod A and able to maintain standing balance with UE support with min A overall, however, when OT has pt remove R UE from sink to assist with pulling up pants or washing buttocks, he needs mod/max A + L knee block to maintain upright. Pt with good recall of hemi-dressing techniques and was able to thread LUE into shirt sleeve. Pt set-up for breakfast and left seated in wc with safety belt on and needs met.  Session 2 Pt greeted seated in wc and agreeable to OT treatment session focused on L NMR and NMES. Therapeutic massage and gentle ROM to pectorals, deltoids and hand 2/2 flexor synergy tone. Applied 1:1 NMES to CH1 supraspinatus and middle deltoid to help approximate shoulder joint,  and Ch 2 wrist extensors.  Ratio 1:1 Rate 35 pps Waveform- Asymmetric Ramp 1.0 Pulse 300 CH1 Intensity- 18 Duration -  15  CH2 Intensity- 25  Duration -  15   Pt returned to room at end of session and left seated in wc with safety belt on and spouse present.   Therapy Documentation Precautions:  Precautions Precautions: Fall Precaution Comments: L sided weakness Restrictions Weight Bearing Restrictions: No Pain:  none/denies  pain ADL: ADL ADL Comments: see functional navigator  See Function Navigator for Current Functional Status.   Therapy/Group: Individual Therapy  Valma Cava 08/28/2017, 1:35 PM

## 2017-08-29 ENCOUNTER — Inpatient Hospital Stay (HOSPITAL_COMMUNITY): Payer: Medicare Other

## 2017-08-29 ENCOUNTER — Inpatient Hospital Stay (HOSPITAL_COMMUNITY): Payer: Medicare Other | Admitting: Occupational Therapy

## 2017-08-29 ENCOUNTER — Inpatient Hospital Stay (HOSPITAL_COMMUNITY): Payer: Medicare Other | Admitting: Physical Therapy

## 2017-08-29 LAB — GLUCOSE, CAPILLARY
GLUCOSE-CAPILLARY: 189 mg/dL — AB (ref 65–99)
GLUCOSE-CAPILLARY: 169 mg/dL — AB (ref 65–99)
Glucose-Capillary: 115 mg/dL — ABNORMAL HIGH (ref 65–99)
Glucose-Capillary: 132 mg/dL — ABNORMAL HIGH (ref 65–99)

## 2017-08-29 MED ORDER — FUROSEMIDE 20 MG PO TABS
10.0000 mg | ORAL_TABLET | Freq: Every day | ORAL | Status: DC
  Administered 2017-08-29 – 2017-09-01 (×4): 10 mg via ORAL
  Filled 2017-08-29 (×4): qty 1

## 2017-08-29 NOTE — Progress Notes (Signed)
Physical Therapy Session Note  Patient Details  Name: [facility] MRN: [id number] Date of Birth: [birth year]  Today's Date: 08/29/2017 PT Individual Time: 1601-1700 PT Individual Time Calculation (min): 59 min   Short Term Goals: Week 3:  PT Short Term Goal 1 (Week 3): =LTGs due to ELOS  Skilled Therapeutic Interventions/Progress Updates:    Pt supine in bed upon PT arrival, agreeable to therapy tx and denies pain. Therapist donned shoes and L AFO total assist for time management. Pt transferred from supine>sitting EOB with min assist. Pt performed stand pivot transfer with max assist and verbal cues for techniques. Pt propelled w/c to the gym with supervision using R hemi technique. Pt performed blocked practice of stand pivot transfers in each direction x 3 with hemi walker from w/c<>mat with verbal cues for techniques and manual facilitation for anterior weightshift. Pt worked on 2 x 10 sit<>stands from the mat this session, emphasis on symmetric WB. Pt worked on static and dynamic standing balance while maintaining midline, min-mod assist. Pt performed mini squats in standing 2 x 10 for neuro re-ed and L quad activation. In standing pt worked on pre-gait stepping forward/backward with R LE with focus on L LE stance control and lateral weightshifting, x 3 trials with manual facilitation for quad activation and weightshifting. Pt propelled w/c back to room and left seated in w/c with QRB in place and wife present.   Therapy Documentation Precautions:  Precautions Precautions: Fall Precaution Comments: L sided weakness Restrictions Weight Bearing Restrictions: No   See Function Navigator for Current Functional Status.   Therapy/Group: Individual Therapy  Netta Corrigan, PT, DPT 08/29/2017, 4:19 PM

## 2017-08-29 NOTE — Progress Notes (Signed)
Occupational Therapy Session Note  Patient Details  Name: Fred [name] MRN: [id number] Date of Birth: 1935-11-20  Today's Date: 08/29/2017 OT Individual Time: 8828-0034 and 1100-1155 OT Individual Time Calculation (min): 75 min and 55 min   Short Term Goals: Week 3:  OT Short Term Goal 1 (Week 3): Pt will maintain standing balance with no more than Mod A while pulling pants up over hips OT Short Term Goal 2 (Week 3): Pt will complete shower transfer with mod A, OT Short Term Goal 3 (Week 3): Pt will demonstrate proficiency in self ROM to L UE  Skilled Therapeutic Interventions/Progress Updates:    Visit 1:  No c/o pain Pt seen for BADL training with a focus on trunk control, adaptive techniques.  Pt received in bed stating that he needed to use the restroom.  To facilitate trunk control, pt cued to flex knees (Assisted with L) and rolled legs to EOB with R arm supporting L.  Cues to fully roll onto R side and look towards floor. Pt then sat up with guiding A.  Initially some posterior leaning but pt was able to self correct with cues.  Used stedy to transfer to elevated toilet seat.  Pt stood in stedy with min A as he used R hand to partially pull pants down.  After having a BM., pt worked on wt shifting toward his L with CGA as he cleansed himself.  Pt then transferred to shower bench.  Using a long sponge, pt washed his back, feet, and upper part of R arm.  He used wash cloth on R thigh to wash lower R forearm. Pt followed directions extremely well.  Used stedy to transfer back to w/c.  W/c placed to side of bed so he could use R hand on bed rail for sit to stand with min A. Once standing, pt could hold static balance with min-mod A but when he attempted to use R hand to pull pants up he then needed max a as he was pushing too far to his L.     Pt was able to don a shirt with only A needed to start over L fingers,  He placed L arm, head and then R arm.  This allowed him to pull shirt over  trunk more easily.  In w/c, worked on up right posture and scapular adduction exercises with PROM and tapping to facilitate tone to LUE. Pt positioned with lap tray, belt, and set up with breakfast tray.  Call light in reach.  Visit 2: No c/o pain  Pt taken to gym to transfer to mat with mod- max A to facilitate forward lean to shift hips up for smooth squat pivot. On mat, pt worked on sitting upright with LUE NMR with PROM and then a/arom with gravity eliminated table top slides.  Trace movement noted in shoulder pushing arm forward and approximately 20-30 degrees of elbow flexion. NMES to wrist extensors for 15 min with hand over hand guiding of grasp/ release exercises with cones.  Pt tolerated an intensity of 40.   B hand grasp with support to L on dowel bar to push bar forward and back. Pt transferred back to w/c and returned to room with QRB on, and lunch tray set up for him.  All needs met.  Therapy Documentation Precautions:  Precautions Precautions: Fall Precaution Comments: L sided weakness Restrictions Weight Bearing Restrictions: No    Pain: Pain Assessment Pain Score: 0-No pain ADL: ADL ADL Comments: see functional  navigator    See Function Navigator for Current Functional Status.   Therapy/Group: Individual Therapy  [place] 08/29/2017, 12:05 PM

## 2017-08-29 NOTE — Progress Notes (Signed)
[facility]     PROGRESS NOTE  Subjective/Complaints:      ROS:No CP, SOB, bowel or bladder issues Objective: Vital Signs: Blood pressure 102/69, pulse 83, temperature 98 F (36.7 C), temperature source Oral, resp. rate 16, weight 86.3 kg (190 lb 4.1 oz), SpO2 97 %. No results found. No results for input(s): WBC, HGB, HCT, PLT in the last 72 hours. No results for input(s): NA, K, CL, GLUCOSE, BUN, CREATININE, CALCIUM in the last 72 hours.  Invalid input(s): CO CBG (last 3)  Recent Labs    08/28/17 1701 08/28/17 2115 08/29/17 0636  GLUCAP 123* 154* 115*    Wt Readings from Last 3 Encounters:  08/29/17 86.3 kg (190 lb 4.1 oz)  08/10/17 84.4 kg (186 lb)  07/18/17 87.1 kg (192 lb)    Physical Exam:  BP 102/69 (BP Location: Right Arm)   Pulse 83   Temp 98 F (36.7 C) (Oral)   Resp 16   Wt 86.3 kg (190 lb 4.1 oz)   SpO2 97%   BMI 33.70 kg/m  Constitutional: No distress . Vital signs reviewed. HEENT: EOMI, oral membranes moist Neck: supple Cardiovascular: RRR without murmur. No JVD    Respiratory: CTA Bilaterally without wheezes or rales. Normal effort    GI: BS +, non-tender, non-distended  Musculoskeletal: He exhibits no edema or tenderness. Neurological: He is alert and oriented 3 Follows full commands.  Fair awareness of deficits.  Motor: LUE 0/5 remains LLE 1/5 hip add, 1/5 distally at the ankle dorsiflexors 0 at the ankle plantar flexors no resting tone Skin: Skin is warm and dry.  Psychiatric: He has a normal mood and affect. His behavior is normal.  ext 1+ edema BLE, L>R Assessment/Plan: 1. 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 Physiatrist is providing close team supervision and 24 hour management of active medical problems listed below. Physiatrist and rehab team  continue to assess barriers to discharge/monitor patient progress toward functional and medical goals.  Function:  Bathing Bathing position   Position: Wheelchair/chair at sink  Bathing parts Body parts bathed by patient: Chest, Abdomen, Right upper leg, Left upper leg, Front perineal area, Left arm Body parts bathed by helper: Right arm, Buttocks, Right lower leg, Left lower leg  Bathing assist Assist Level: Touching or steadying assistance(Pt > 75%)      Upper Body Dressing/Undressing Upper body dressing   What is the patient wearing?: Pull over shirt/dress     Pull over shirt/dress - Perfomed by patient: Thread/unthread left sleeve, Thread/unthread right sleeve, Put head through opening Pull over shirt/dress - Perfomed by helper: Pull shirt over trunk        Upper body assist Assist Level: Touching or steadying assistance(Pt > 75%)      Lower Body Dressing/Undressing Lower body dressing   What is the patient wearing?: Pants, Socks, Shoes   Underwear - Performed by helper: Thread/unthread right underwear leg, Thread/unthread left underwear leg, Pull underwear up/down Pants- Performed by patient: Thread/unthread right pants leg Pants- Performed by helper: Thread/unthread left pants leg, Pull pants up/down   Non-skid slipper socks- Performed by helper: Don/doff right sock, Don/doff left sock   Socks - Performed by helper: Don/doff right sock, Don/doff left sock   Shoes - Performed by helper: Don/doff right shoe, Don/doff left shoe          Lower body assist  Assist for lower body dressing: Touching or steadying assistance (Pt > 75%)      Toileting Toileting Toileting activity did not occur: No continent bowel/bladder event   Toileting steps completed by helper: Adjust clothing prior to toileting, Performs perineal hygiene, Adjust clothing after toileting    Toileting assist Assist level: Touching or steadying assistance (Pt.75%)   Transfers Chair/bed transfer    Chair/bed transfer method: Stand pivot Chair/bed transfer assist level: Moderate assist (Pt 50 - 74%/lift or lower) Chair/bed transfer assistive device: Other(hemiwalker) Mechanical lift: Ecologist     Max distance: 15 ft Assist level: 2 helpers(pt max assist, +2 for w/c follow)   Wheelchair   Type: Manual Max wheelchair distance: 150' Assist Level: Supervision or verbal cues  Cognition Comprehension Comprehension assist level: Follows basic conversation/direction with extra time/assistive device  Expression Expression assist level: Expresses basic needs/ideas: With extra time/assistive device  Social Interaction Social Interaction assist level: Interacts appropriately with others with medication or extra time (anti-anxiety, antidepressant).  Problem Solving Problem solving assist level: Solves basic problems with no assist  Memory Memory assist level: More than reasonable amount of time    Medical Problem List and Plan:  1. 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 Aspirin and Plavix x3 weeks then aspirin alone will plan to D/C Plavix~6/20       Team conf in am                    2. DVT Prophylaxis/Anticoagulation: Subcutaneous Lovenox  3. Pain Management: Tylenol as needed  4. Mood: Provide emotional support  5. Neuropsych: This patient is capable of making decisions on his own behalf.  6. Skin/Wound Care: Routine skin checks  7. Fluids/Electrolytes/Nutrition: Routine in and outs   Most meals 100%, I 630ml on 6/18 8. CAD with CABG. Continue aspirin and Plavix. No chest pain or shortness of breath  9. Diabetes mellitus. Hemoglobin A1c 6.0. SSI. Check blood sugars before meals and at bedtime Home meds  Glucophage 1000 mg twice daily, Amaryl 4 mg daily, Lantus insulin 10 units nightly, Tradjenta 5 mg daily  CBG (last 3)  Recent Labs    08/28/17 1701 08/28/17 2115 08/29/17 0636   GLUCAP 123* 154* 115*  Controlled 6/18 Tradjenta 5mg ,metformin 500mg  BID 10. Dysphagia. Dysphasia #2 nectar liquids. Follow-up speech therapy  11. Hypertension. Cozaar 25 mg daily, Toprol 50 mg daily  Vitals:   08/28/17 2123 08/29/17 0442  BP: 120/68 102/69  Pulse: 70 83  Resp: 16 16  Temp: 97.6 F (36.4 C) 98 F (36.7 C)  SpO2: 100% 97%  Controlled 6/18 12. Diastolic congestive heart failure. Monitor for any signs of fluid overload. Patient was taking Lasix 40 mg daily if needed for fluid retention prior to admission.   No SOB   Daily weights ordered--some fluctuation Filed Weights   08/27/17 0541 08/28/17 0420 08/29/17 0442  Weight: 86.3 kg (190 lb 4.1 oz) 86.2 kg (190 lb 0.6 oz) 86.3 kg (190 lb 4.1 oz)   13. History of seizure disorder. Depakote 500 mg daily.  14. Hyperlipidemia. Lipitor  15. History of alcohol use. Monitor for withdrawal  16.  Elevated Creat need to monitor given pt on Metformin 17.  Constipation-improved, reduced senna to 1po BID,Continent of stool continue Bisacodyl supp prn   LOS (Days) 18 A FACE TO FACE EVALUATION WAS PERFORMED  Charlett Blake 08/29/2017 7:40 AM

## 2017-08-29 NOTE — Progress Notes (Signed)
Physical Therapy Weekly Progress Note  Patient Details  Name: Fred [name] MRN: [id number] Date of Birth: 16-Feb-1936  Beginning of progress report period: August 19, 2017 End of progress report period: August 29, 2017  Today's Date: 08/29/2017 PT Individual Time: 1000-1100 PT Individual Time Calculation (min): 60 min   Patient has met 3 of 4 short term goals. Pt is making steady progress towards LTGs and performing most OOB mobility at the mod-max assist level. He continues to require max assist for LLE management during gait, however demonstrates improved L quad activation w/ tactile, verbal, and manual cues. He also demonstrates improved postural control during functional mobility requiring less cues for neutral upright, R weight bearing, and anterior weight shifting. His family remains very supportive and encouraging of pt's recovery.   Patient continues to demonstrate the following deficits muscle weakness, decreased cardiorespiratoy endurance, impaired timing and sequencing, unbalanced muscle activation, decreased coordination and decreased motor planning, decreased midline orientation, decreased problem solving, decreased safety awareness, decreased memory and delayed processing and decreased sitting balance, decreased standing balance, decreased postural control, hemiplegia and decreased balance strategies and therefore will continue to benefit from skilled PT intervention to increase functional independence with mobility.  Patient progressing toward long term goals..  Continue plan of care. Will continue to work towards min assist goals at this time.   PT Short Term Goals Week 2:  PT Short Term Goal 1 (Week 2): Pt will transfer bed<>chair w/ mod assist PT Short Term Goal 1 - Progress (Week 2): Met PT Short Term Goal 2 (Week 2): Pt will demonstrate trace L quad activation during functional mobility 25% of the time PT Short Term Goal 2 - Progress (Week 2): Met PT Short Term Goal 3 (Week  2): Pt will ambulate 15' w/ mod assist  PT Short Term Goal 3 - Progress (Week 2): Progressing toward goal PT Short Term Goal 4 (Week 2): Pt will maintain dynamic sitting balance w/ min assist  PT Short Term Goal 4 - Progress (Week 2): Met Week 3:  PT Short Term Goal 1 (Week 3): =LTGs due to ELOS  Skilled Therapeutic Interventions/Progress Updates:   Pt in w/c and agreeable to therapy, denies pain. Session focused on NMR including L quad activation, midline orientation in stance, and gait. Practiced sit<>stands to hemiwalker w/ min assist and max assist for midline orientation in stance w/ verbal and tactile cues for posture and WB on R. Transitioned to working on sit<>stands at rail in hallway and gait/pre-gait activities. Ambulated 30' w/ max assist for LLE management including step placement and forward/lateral weight shifting during L weight acceptance. Mod-max tactile cues for L quad activation. Ambulated another 10' w/ max assist w/ LAFO, however decreased assistance required for LLE management. Performed lateral weight shifting w/ emphasis on R WB and L forward and backward stepping w/ emphasis on R weight shifting and L hip flexor activation. Returned to room and ended session in w/c, call bell within reach and all needs met.   Therapy Documentation Precautions:  Precautions Precautions: Fall Precaution Comments: L sided weakness Restrictions Weight Bearing Restrictions: No Pain: Pain Assessment Pain Score: 0-No pain  See Function Navigator for Current Functional Status.  Therapy/Group: Individual Therapy  Octavis Sheeler K Arnette 08/29/2017, 11:55 AM

## 2017-08-30 ENCOUNTER — Inpatient Hospital Stay (HOSPITAL_COMMUNITY): Payer: Medicare Other | Admitting: Occupational Therapy

## 2017-08-30 ENCOUNTER — Inpatient Hospital Stay (HOSPITAL_COMMUNITY): Payer: Medicare Other

## 2017-08-30 LAB — GLUCOSE, CAPILLARY
GLUCOSE-CAPILLARY: 127 mg/dL — AB (ref 65–99)
Glucose-Capillary: 120 mg/dL — ABNORMAL HIGH (ref 65–99)
Glucose-Capillary: 210 mg/dL — ABNORMAL HIGH (ref 65–99)
Glucose-Capillary: 146 mg/dL — ABNORMAL HIGH (ref 65–99)

## 2017-08-30 NOTE — Plan of Care (Signed)
  Problem: Consults Goal: RH STROKE PATIENT EDUCATION Description See Patient Education module for education specifics  Outcome: Progressing   Problem: RH BOWEL ELIMINATION Goal: RH STG MANAGE BOWEL W/MEDICATION W/ASSISTANCE Description STG Manage Bowel with Medication with  Mod I Assistance.  Outcome: Progressing   Problem: RH BLADDER ELIMINATION Goal: RH STG MANAGE BLADDER WITH ASSISTANCE Description STG Manage Bladder With  Min Assistance  Outcome: Progressing   Problem: RH SAFETY Goal: RH STG ADHERE TO SAFETY PRECAUTIONS W/ASSISTANCE/DEVICE Description STG Adhere to Safety Precautions With  Mod cues/ supervision Assistance/Device.   Outcome: Progressing   Problem: RH KNOWLEDGE DEFICIT Goal: RH STG INCREASE KNOWLEDGE OF DIABETES Description Pt will be able to explain how to monitor and manage diabetes/diet/medications using notes/resources independently  Outcome: Progressing Goal: RH STG INCREASE KNOWLEDGE OF HYPERTENSION Description Pt will be able to demo understanding of HH/CMM diet and medications to manage HTN and state what to do to manage HTN and when to take medications  Outcome: Progressing   Problem: RH KNOWLEDGE DEFICIT Goal: RH STG INCREASE KNOWLEDGE OF DIABETES Description Pt will be able to explain how to monitor and manage diabetes/diet/medications using notes/resources independently  Outcome: Progressing Goal: RH STG INCREASE KNOWLEDGE OF HYPERTENSION Description Pt will be able to demo understanding of HH/CMM diet and medications to manage HTN and state what to do to manage HTN and when to take medications  Outcome: Progressing

## 2017-08-30 NOTE — Progress Notes (Signed)
Physical Therapy Session Note  Patient Details  Name: Fred [name] MRN: [id number] Date of Birth: [birth year]  Today's Date: 08/30/2017 PT Individual Time: 0803-0900 and 1545-1700 PT Individual Time Calculation (min): 57 min and 75 min   Short Term Goals: Week 3:  PT Short Term Goal 1 (Week 3): =LTGs due to ELOS  Skilled Therapeutic Interventions/Progress Updates:    Session 1: Pt supine in bed upon PT arrival, agreeable to therapy tx and denies pain. Pt transferred from supine>sitting EOB with supervision using bedrails. Pt seated EOB therapist donned shoes and L AFO for time management. Pt performed stand pivot transfer from bed>w/c using bedrail and armrest with mod assist, verbal cues for techniques. Pt propelled to gym with supervision using R hemi technique. Pt performed stand pivot transfer from w/c>mat with hemi walker and mod assist. Pt worked on sit<>stand from mat throughout session with hemiwalker and min-mod assist, increased L quad activation this session. In standing pt worked on L LE stance control in order to perform stepping forward/backward with R LE and to perform toe taps on 2 inch step with R LE, x 2 trials, min-mod assist. Pt performed stand pivot back to w/c mod assist with hemiwalker. Pt ambulated x 15 ft with R handrail, L AFO and mod assist, improved L knee control during stance, continues to require total assist to advance L LE with swing. Pt ambulated x 15 ft with hemiwalker, L AFO, max assist, decreased ability to maintain midline during ambulation with hemiwalker requiring verbal/tactile cues to prevent R lateral lean/pushing. Pt transported back to room and left seated with needs in reach, QRB in place.   Session 2: Pt seated in w/c upon PT arrival, agreeable to therapy tx and denies pain. Therapist donned shoes and L AFO total assist for time management. Pt propelled w/c to the gym with supervision. Pt performed stand pivot transfer from w/c>mat with hemi walker and  mod assist. Trial of RW with L hand orthosis this session for pre-gait and ambulation. Pt performed sit<>stands with RW and min assist, performs pre-gait with mod assist and use of RW. Pt attempted to ambulate with RW however unable to perform R lateral weightshift in order to clear L LE. Pt ambulated x 15 ft with hemiwalker and max assist, pt performs improved R lateral weigthshift, still requiring total assist to advance L LE during swing. Pt performed stand pivot transfer to mat with hemiwalker and mod assist. Pt transferred from sitting>supine with min assist. In supine pt performed 2 x 10 bridges with B LEs for neuro re-ed and strengthening. Pt transferred from supine>L sidelying with min assist. In sidelying position pt performed gravity eliminated exercises with use of powder board for L LE neuro re-ed including: active knee extension 2 x 10, active hip extension 2 x 10, active assisted hip flexion x 10, isometric quadriceps contraction 2 x 10 with verbal/tactile cues for techniques/muscle isolation. Pt transferred from L sidelying to sitting edge of mat with supervision. Pt performed stand pivot back to w/c towards the L with min assist, verbal cues for techniques. Pt transported back to room and left seated in care of family.      Therapy Documentation Precautions:  Precautions Precautions: Fall Precaution Comments: L sided weakness Restrictions Weight Bearing Restrictions: No   See Function Navigator for Current Functional Status.   Therapy/Group: Individual Therapy  Netta Corrigan, PT, DPT 08/30/2017, 7:45 AM

## 2017-08-30 NOTE — Progress Notes (Signed)
Orthopedic Tech Progress Note Patient Details:  Fred [name] [birth year] [id number]  Patient ID: Fred [name], male   DOB: 01-08-1936, 82 y.o.   MRN: [id number]   Hildred Priest Called in hanger brace order; spoke with [facility]

## 2017-08-30 NOTE — Progress Notes (Signed)
[place] PHYSICAL MEDICINE & REHABILITATION     PROGRESS NOTE  Subjective/Complaints:   No issues overnite, In PT, working on left quad activation Does well with trial AFO   ROS:No CP, SOB, bowel or bladder issues Objective: Vital Signs: Blood pressure (!) 125/54, pulse 73, temperature 98 F (36.7 C), temperature source Oral, resp. rate 18, height 5\' 3"  (1.6 m), weight 86.2 kg (190 lb 0.6 oz), SpO2 98 %. No results found. No results for input(s): WBC, HGB, HCT, PLT in the last 72 hours. No results for input(s): NA, K, CL, GLUCOSE, BUN, CREATININE, CALCIUM in the last 72 hours.  Invalid input(s): CO CBG (last 3)  Recent Labs    08/29/17 1703 08/29/17 2110 08/30/17 0654  GLUCAP 132* 169* 120*    Wt Readings from Last 3 Encounters:  08/30/17 86.2 kg (190 lb 0.6 oz)  08/10/17 84.4 kg (186 lb)  07/18/17 87.1 kg (192 lb)    Physical Exam:  BP (!) 125/54 (BP Location: Right Arm)   Pulse 73   Temp 98 F (36.7 C) (Oral)   Resp 18   Ht 5\' 3"  (1.6 m)   Wt 86.2 kg (190 lb 0.6 oz)   SpO2 98%   BMI 33.66 kg/m  Constitutional: No distress . Vital signs reviewed. HEENT: EOMI, oral membranes moist Neck: supple Cardiovascular: RRR without murmur. No JVD    Respiratory: CTA Bilaterally without wheezes or rales. Normal effort    GI: BS +, non-tender, non-distended  Musculoskeletal: He exhibits no edema or tenderness. Neurological: He is alert and oriented 3 Follows full commands.  Fair awareness of deficits.  Motor: LUE 0/5 remains LLE 1/5 hip add, 1/5 distally at the ankle dorsiflexors 0 at the ankle plantar flexors no resting tone Skin: Skin is warm and dry.  Psychiatric: He has a normal mood and affect. His behavior is normal.  ext 1+ edema BLE, L>R Assessment/Plan: 1. 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 Physiatrist  is providing close team supervision and 24 hour management of active medical problems listed below. Physiatrist and rehab team continue to assess barriers to discharge/monitor patient progress toward functional and medical goals.  Function:  Bathing Bathing position   Position: Shower  Bathing parts Body parts bathed by patient: Chest, Abdomen, Right upper leg, Left upper leg, Front perineal area, Left arm, Right arm, Right lower leg, Left lower leg, Back(using long bath sponge) Body parts bathed by helper: Buttocks  Bathing assist Assist Level: Touching or steadying assistance(Pt > 75%)      Upper Body Dressing/Undressing Upper body dressing   What is the patient wearing?: Pull over shirt/dress     Pull over shirt/dress - Perfomed by patient: Thread/unthread left sleeve, Thread/unthread right sleeve, Put head through opening, Pull shirt over trunk Pull over shirt/dress - Perfomed by helper: Pull shirt over trunk        Upper body assist Assist Level: Touching or steadying assistance(Pt > 75%)      Lower Body Dressing/Undressing Lower body dressing   What is the patient wearing?: Pants, Socks, Shoes   Underwear - Performed by helper: Thread/unthread right underwear leg, Thread/unthread left underwear leg, Pull underwear up/down Pants- Performed by patient: Thread/unthread right pants leg Pants- Performed by helper: Thread/unthread left pants leg, Pull pants up/down(pt helps with pulling left pants over leg 50% , over hips 25%)   Non-skid slipper socks- Performed  by helper: Don/doff right sock, Don/doff left sock   Socks - Performed by helper: Don/doff right sock, Don/doff left sock   Shoes - Performed by helper: Don/doff right shoe, Don/doff left shoe          Lower body assist Assist for lower body dressing: Touching or steadying assistance (Pt > 75%)      Toileting Toileting Toileting activity did not occur: No continent bowel/bladder event Toileting steps completed  by patient: Performs perineal hygiene Toileting steps completed by helper: Adjust clothing after toileting, Adjust clothing prior to toileting(pt helped to pull pants down)    Toileting assist Assist level: Touching or steadying assistance (Pt.75%)   Transfers Chair/bed transfer   Chair/bed transfer method: Stand pivot Chair/bed transfer assist level: Moderate assist (Pt 50 - 74%/lift or lower) Chair/bed transfer assistive device: Other(hemiwalker) Mechanical lift: Ecologist     Max distance: 30' Assist level: Maximal assist (Pt 25 - 49%)   Wheelchair   Type: Manual Max wheelchair distance: 150' Assist Level: Supervision or verbal cues  Cognition Comprehension Comprehension assist level: Follows basic conversation/direction with extra time/assistive device  Expression Expression assist level: Expresses basic needs/ideas: With extra time/assistive device  Social Interaction Social Interaction assist level: Interacts appropriately with others with medication or extra time (anti-anxiety, antidepressant).  Problem Solving Problem solving assist level: Solves basic problems with no assist  Memory Memory assist level: More than reasonable amount of time    Medical Problem List and Plan:  1. 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 Aspirin and Plavix x3 weeks then aspirin alone will plan to D/C Plavix~6/20       Team conf in am                    2. DVT Prophylaxis/Anticoagulation: Subcutaneous Lovenox  3. Pain Management: Tylenol as needed  4. Mood: Provide emotional support  5. Neuropsych: This patient is capable of making decisions on his own behalf.  6. Skin/Wound Care: Routine skin checks  7. Fluids/Electrolytes/Nutrition: Routine in and outs   Most meals 100%, I 666ml on 6/18 8. CAD with CABG. Continue aspirin and Plavix. No chest pain or shortness of breath  9. Diabetes  mellitus. Hemoglobin A1c 6.0. SSI. Check blood sugars before meals and at bedtime Home meds  Glucophage 1000 mg twice daily, Amaryl 4 mg daily, Lantus insulin 10 units nightly, Tradjenta 5 mg daily  CBG (last 3)  Recent Labs    08/29/17 1703 08/29/17 2110 08/30/17 0654  GLUCAP 132* 169* 120*  Controlled 6/19 Tradjenta 5mg ,metformin 500mg  BID 10. Dysphagia. Dysphasia #2 nectar liquids. Follow-up speech therapy  11. Hypertension. Cozaar 25 mg daily, Toprol 50 mg daily  Vitals:   08/29/17 2110 08/30/17 0439  BP: 136/62 (!) 125/54  Pulse: 74 73  Resp: 18 18  Temp: 98 F (36.7 C) 98 F (36.7 C)  SpO2: 99% 98%  Controlled 6/19 12. Diastolic congestive heart failure. Monitor for any signs of fluid overload. Patient was taking Lasix 40 mg daily if needed for fluid retention prior to admission.   No SOB, restarted low dose Lasix yesterday   Daily weights ordered--some fluctuation Filed Weights   08/28/17 0420 08/29/17 0442 08/30/17 0439  Weight: 86.2 kg (190 lb 0.6 oz) 86.3 kg (190 lb 4.1 oz) 86.2 kg (190 lb 0.6 oz)   13. History of seizure disorder. Depakote 500 mg daily.  14. Hyperlipidemia. Lipitor  15.  History of alcohol use. Monitor for withdrawal  16.  Elevated Creat need to monitor given pt on Metformin 17.  Constipation-improved, reduced senna to 1po BID,Continent of stool continue Bisacodyl supp prn   LOS (Days) 19 A FACE TO FACE EVALUATION WAS PERFORMED  Charlett Blake 08/30/2017 8:50 AM

## 2017-08-30 NOTE — Progress Notes (Signed)
Occupational Therapy Session Note  Patient Details  Name: Fred [name] MRN: [id number] Date of Birth: 02-18-1936  Today's Date: 08/30/2017 OT Individual Time: 1000-1100 OT Individual Time Calculation (min): 60 min   Short Term Goals: Week 3:  OT Short Term Goal 1 (Week 3): Pt will maintain standing balance with no more than Mod A while pulling pants up over hips OT Short Term Goal 2 (Week 3): Pt will complete shower transfer with mod A, OT Short Term Goal 3 (Week 3): Pt will demonstrate proficiency in self ROM to L UE  Skilled Therapeutic Interventions/Progress Updates:    Pt greeted seated in wc and agreeable to OT. Pt declined BADLs and stated he did not need to use the bathroom. Wc propulsion from room to therapy gym with supervision and increased time. Pt needed 3 trials to power up into standing and mod/max A to facilitate pivot. Pt brought into gravity eliminated sidelying position for L UE NMR. Utilized table for support and hand skate. OT facilitated movement at elbow/shoulder/scap with trace activation felt in scap elevation and depression. Applied 1:1 NMES to CH1 supraspinatus and middle deltoid to help approximate shoulder joint, and Ch 2 wrist extensors.  Ratio 1:1 Rate 35 pps Waveform- Asymmetric Ramp 1.0 Pulse 300 CH1 Intensity- 18  Duration -  15  CH2 Intensity- 24 Duration -  15  Slideboard used to transfer back to wc with mod A. Pt returned to room and left seated in wc with safety belt, and L lap tray, needs met.   Therapy Documentation Precautions:  Precautions Precautions: Fall Precaution Comments: L sided weakness Restrictions Weight Bearing Restrictions: No Pain:   ADL: ADL ADL Comments: see functional navigator  See Function Navigator for Current Functional Status.  Therapy/Group: Individual Therapy  Valma Cava 08/30/2017, 3:55 PM

## 2017-08-31 ENCOUNTER — Inpatient Hospital Stay (HOSPITAL_COMMUNITY): Payer: Medicare Other | Admitting: Physical Therapy

## 2017-08-31 ENCOUNTER — Inpatient Hospital Stay (HOSPITAL_COMMUNITY): Payer: Medicare Other | Admitting: Occupational Therapy

## 2017-08-31 LAB — GLUCOSE, CAPILLARY
GLUCOSE-CAPILLARY: 199 mg/dL — AB (ref 65–99)
GLUCOSE-CAPILLARY: 123 mg/dL — AB (ref 65–99)
GLUCOSE-CAPILLARY: 134 mg/dL — AB (ref 65–99)
Glucose-Capillary: 110 mg/dL — ABNORMAL HIGH (ref 65–99)

## 2017-08-31 LAB — BASIC METABOLIC PANEL
Anion gap: 9 (ref 5–15)
BUN: 15 mg/dL (ref 6–20)
CHLORIDE: 108 mmol/L (ref 101–111)
CO2: 26 mmol/L (ref 22–32)
CREATININE: 1.11 mg/dL (ref 0.61–1.24)
Calcium: 9.6 mg/dL (ref 8.9–10.3)
GFR calc non Af Amer: >60 mL/min (ref >60)
Glucose, Bld: 163 mg/dL — ABNORMAL HIGH (ref 65–99)
POTASSIUM: 4.2 mmol/L (ref 3.5–5.1)
Sodium: 143 mmol/L (ref 135–145)

## 2017-08-31 NOTE — Progress Notes (Signed)
Physical Therapy Session Note  Patient Details  Name: Fred [name] MRN: [id number] Date of Birth: November 25, 1935  Today's Date: 08/31/2017 PT Individual Time: 0800-0910 AND 1330-1430 PT Individual Time Calculation (min): 70 min AND 60 min  Short Term Goals: Week 3:  PT Short Term Goal 1 (Week 3): =LTGs due to ELOS  Skilled Therapeutic Interventions/Progress Updates:   Session 1:  Pt in supine and agreeable to therapy, denies pain. Transferred to EOB and to w/c via stand pivot w/ mod assist. Pt self-propelled w/c to/from therapy gym w/ supervision for practice w/ self-propulsion. Session focused on LLE NMR, midline orientation, pre-gait, and gait tasks. Attempted sit<>stands to hemi-walker w/ mirror feedback and visual/manual cues on ground for R foot placement to decreased lateropulsion. However, pt requiring max assist to overcome lateropulsion this date, unable to effectively work on tasks so transitioned to rail in hallway w/ increased success. Pt able to maintain midline w/ min assist at rail. Performed alternating L TKEs w manual cues, lateral weight shifts, and bilateral forward and backward stepping. Raised RLE on 2" step w/ L forward and backward stepping for improved foot clearance to work on L hip flexion activation. Ambulated at rail, 82', w/ max assist overall for lateral weight shifting and total assist for LLE foot placement. Pt continues to require manual assist for L swing limb advancement, but demonstrates improved initiation of movement. LAFO worn during entire session. Pt additionally c/o increased "wooziness" this session, combined w/ worse lateraopulsion and assist needed for gait, vitals checked and WNL. MD also made aware. Returned to room and ended session in w/c, in care of family and all needs met.  Session 2:  Pt in w/c and agreeable to therapy, denies pain. Pt notes feeling better this afternoon, thinks he "had a slow start today". Total assist w/c transport to/from  therapy gym for time management. Worked on [employer] and gait this session. Performed mini-squats at rail in hallway w/ min manual assist for L quad activation. Ambulated at rail w/ mod assist, 30'. Mod assist only needed for midline orientation and facilitation of L hip flexion and step placement, no manual assistance needed for L quad control. Ortho rep present assessing use of LAFO for gait, plans to add toe cap to L shoe and foot lift for R foot to assist w/ L foot clearance. Worked on gravity eliminated L hip flexion and knee extension exercises in sidelying remainder of session. Total assist to engage hip flexors and bring LE through movement pattern. Performed 50+ reps in total, trace muscle activation in ~50% of attempts. Returned to room and ended session in w/c, call bell within reach and all needs met.   Therapy Documentation Precautions:  Precautions Precautions: Fall Precaution Comments: L sided weakness Restrictions Weight Bearing Restrictions: No  See Function Navigator for Current Functional Status.   Therapy/Group: Individual Therapy  Fred [name] 08/31/2017, 7:08 PM

## 2017-08-31 NOTE — Progress Notes (Signed)
Social Work Patient ID: Fred [name], male   DOB: [birth year], 82 y.o.   MRN: [id number]     Odes Lolli, [name]  Social Worker    Patient Care Conference  Signed  Date of Service:  08/31/2017  8:02 PM          Signed          Show:Clear all [x] Manual[x] Template[] Copied  Added by: [x] Laydon Martis, Gerline Legacy, LCSW   [] Hover for details   Inpatient RehabilitationTeam Conference and Plan of Care Update Date: 08/30/2017   Time: 11:05 AM      Patient Name: Fred [name]      Medical Record Number: [id number]  Date of Birth: 1935/05/20 Sex: Male         Room/Bed: 4W14C/4W14C-01 Payor Info: Payor: MEDICARE / Plan: MEDICARE PART A AND B / Product Type: *No Product type* /     Admitting Diagnosis: CVA  Admit Date/Time:  08/11/2017  5:19 PM Admission Comments: No comment available    Primary Diagnosis:  <principal problem not specified> Principal Problem: <principal problem not specified>       Patient Active Problem List    Diagnosis Date Noted  . Diabetes mellitus type 2 in obese ([place])    . Small vessel disease ([place]) 08/11/2017  . Left hemiparesis ([place])    . Benign essential hypertension with delivery    . CVA (cerebral vascular accident) ([place]) 08/10/2017  . 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 of localized onset, not intractable, without status epilepticus ([place]) 06/20/2017  . Primary osteoarthritis of both knees 05/11/2017  . Stroke (cerebrum) ([place]) 03/21/2017  . Type 2 diabetes mellitus with complication, with long-term current use of insulin ([place]) 03/27/2015  . Atherosclerosis of native coronary artery of native heart without angina pectoris 03/27/2015  . Palpitations 11/04/2014  . S/P CABG (coronary artery bypass graft) 11/04/2014  . Atrial fibrillation, unspecified    . History of stroke 09/04/2014  . Obesity (BMI 30-39.9) 11/29/2013  . History of gout 05/16/2013  . Congestive heart failure ([place]) 04/20/2009  .  DYSPNEA 02/11/2009  . CARDIOVASCULAR FUNCTION STUDY, ABNORMAL 01/29/2009  . NECK PAIN 08/03/2007  . ANEMIA / OTHER 05/16/2007  . Coronary atherosclerosis 05/16/2007  . PAROXYSMAL ATRIAL FIBRILLATION 05/16/2007  . Subcortical infarction ([place]) 05/16/2007  . Hyperlipidemia 03/30/2007  . DEGENERATIVE JOINT DISEASE 03/30/2007  . PAROTID LESION, UNSPECIFIED 09/29/2006  . Partial epilepsy with impairment of consciousness ([place]) 09/29/2006  . Diabetic polyneuropathy ([place]) 09/29/2006  . Essential hypertension 09/29/2006  . PERIPHERAL VASCULAR DISEASE 09/29/2006  . DIVERTICULOSIS 09/29/2006      Expected Discharge Date: Expected Discharge Date: 09/08/17   Team Members Present: Physician leading conference: Dr. Alysia Penna Social Worker Present: Alfonse Alpers, LCSW Nurse Present: Dorthula Nettles, RN PT Present: Michaelene Song, PT OT Present: Cherylynn Ridges, OT SLP Present: Weston Anna, SLP PPS Coordinator present : Daiva Nakayama, RN, CRRN       Current Status/Progress Goal Weekly Team Focus  Medical     Diabetic control improving,   maintain med stability, reduce fall risk  management of diabetic    Bowel/Bladder     continent of b/b, LBM 6/17, uses urinal with assist  maintain b/b with min A  monitor b/b q shift and prn   Swallow/Nutrition/ Hydration               ADL's     max A squat pivot transfers, min A sit to stand, mod A static stand balance,  min A of 10% with donning shirt, mod A with pants, total socks/ shoes, mod A toileting, min A bathing with AE  Min A  L NMR, NMES, transfer training, sit >< stand, ADL training   Mobility     mod-max assist transfers and gait 30'  Min assist, gait short distance  all functional mobility, discharge planning, L NMR   Communication               Safety/Cognition/ Behavioral Observations             Pain     no c/o of pain  pain </= 2  monitor pain q shift and prn   Skin     not skin breakdown, bruising to abdomen from lovenox  injections  maintain skin with mod I A  monitor q shfit and prn     Rehab Goals Patient on target to meet rehab goals: Yes Rehab Goals Revised: none *See Care Plan and progress notes for long and short-term goals.      Barriers to Discharge   Current Status/Progress Possible Resolutions Date Resolved   Physician     Medical stability  Severe hemiparesis, little improvment  Progressing   Cont rehab, family training      Nursing                 PT  Inaccessible home environment;Home environment access/layout  2 steps to enter home  family discussing ramp, need to begin pursuing this seriously           OT                 SLP            SW              Discharge Planning/Teaching Needs:  Pt plans to return to his home with his wife.  Family members plans to pull together to be with pt/wife 24/7, with pt needing min A.  Family education to be scheduled next Tuesday and Thursday with 4 different family members.   Team Discussion:  Pt is doing pretty well, but is not getting much return on his left side.  Does not have any medical issues.  Pt is continent and eating fairly.  OT doing estim to arm and weightbearing.  Has some tone in arm.  Pt is mod to max A; min A standing, but pushing still.  Pt is working hard and using adaptive equipment.  Pt is min A bed mobility; mod A with hemi walker; gait at rail is mod A, but pt needs more assistance with walker.  Ortho consult done.  Revisions to Treatment Plan:  ST d/c'd pt from services this past week.    Continued Need for Acute Rehabilitation Level of Care: The patient requires daily medical management by a physician with specialized training in physical medicine and rehabilitation for the following conditions: Daily direction of a multidisciplinary physical rehabilitation program to ensure safe treatment while eliciting the highest outcome that is of practical value to the patient.: Yes Daily medical management of patient stability for  increased activity during participation in an intensive rehabilitation regime.: Yes Daily analysis of laboratory values and/or radiology reports with any subsequent need for medication adjustment of medical intervention for : Post surgical problems;Other   Diasha Castleman, Silvestre Mesi 08/31/2017, 8:03 PM

## 2017-08-31 NOTE — Progress Notes (Signed)
Occupational Therapy Session Note  Patient Details  Name: Fred [name] MRN: [id number] Date of Birth: 04/20/1935  Today's Date: 08/31/2017 OT Individual Time: 1002-1100 OT Individual Time Calculation (min): 58 min   Short Term Goals: Week 3:  OT Short Term Goal 1 (Week 3): Pt will maintain standing balance with no more than Mod A while pulling pants up over hips OT Short Term Goal 2 (Week 3): Pt will complete shower transfer with mod A, OT Short Term Goal 3 (Week 3): Pt will demonstrate proficiency in self ROM to L UE  Skilled Therapeutic Interventions/Progress Updates:    Pt greeted seated on toilet on stedy. Pt with successful BM and voided bladder. Sit<>stand in [place] with min A, then had pt practice reaching behind to wash buttocks and complete peri-care with mod/max A for dynamic balance 2/2 lateral lean to the L. Stedy then used to transfer pt into shower. Bathing completed with OT providing hand over hand A to L UE for neuro re-ed within bathing tasks. LH sponge used for washing LEs and back. Dressing completed wc level at the sink. Pt needed verbal cues and increased time to don shirt today 2/2 forgetting hemi techniques. Reacher used to assist with threading pant legs with pt needing assist for LLE. Multiple sit<>stands with Mod A and OT facilitating weight shifting and balance strategies. L UE NMR seated in wc with OT providing joint input to wrist and elbow. OT brought L UE into supination, then pt able to activate forearm pronation! Pt also with thumb flex/ext today. Pt left seated in wc at end of session with safety belt on and needs met.  Therapy Documentation Precautions:  Precautions Precautions: Fall Precaution Comments: L sided weakness Restrictions Weight Bearing Restrictions: No Pain: Pain Assessment Pain Scale: 0-10 Pain Score: 0-No pain ADL: ADL ADL Comments: see functional navigator  See Function Navigator for Current Functional Status.   Therapy/Group:  Individual Therapy  Valma Cava 08/31/2017, 11:03 AM

## 2017-08-31 NOTE — Patient Care Conference (Signed)
Inpatient RehabilitationTeam Conference and Plan of Care Update Date: 08/30/2017   Time: 11:05 AM    Patient Name: Fred [name]      Medical Record Number: [id number]  Date of Birth: [birth year] Sex: Male         Room/Bed: 4W14C/4W14C-01 Payor Info: Payor: MEDICARE / Plan: MEDICARE PART A AND B / Product Type: *No Product type* /    Admitting Diagnosis: CVA  Admit Date/Time:  08/11/2017  5:19 PM Admission Comments: No comment available   Primary Diagnosis:  <principal problem not specified> Principal Problem: <principal problem not specified>  Patient Active Problem List   Diagnosis Date Noted  . Diabetes mellitus type 2 in obese ([place])   . Small vessel disease ([place]) 08/11/2017  . Left hemiparesis ([place])   . Benign essential hypertension with delivery   . CVA (cerebral vascular accident) ([place]) 08/10/2017  . 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 of localized onset, not intractable, without status epilepticus ([place]) 06/20/2017  . Primary osteoarthritis of both knees 05/11/2017  . Stroke (cerebrum) ([place]) 03/21/2017  . Type 2 diabetes mellitus with complication, with long-term current use of insulin ([place]) 03/27/2015  . Atherosclerosis of native coronary artery of native heart without angina pectoris 03/27/2015  . Palpitations 11/04/2014  . S/P CABG (coronary artery bypass graft) 11/04/2014  . Atrial fibrillation, unspecified   . History of stroke 09/04/2014  . Obesity (BMI 30-39.9) 11/29/2013  . History of gout 05/16/2013  . Congestive heart failure ([place]) 04/20/2009  . DYSPNEA 02/11/2009  . CARDIOVASCULAR FUNCTION STUDY, ABNORMAL 01/29/2009  . NECK PAIN 08/03/2007  . ANEMIA / OTHER 05/16/2007  . Coronary atherosclerosis 05/16/2007  . PAROXYSMAL ATRIAL FIBRILLATION 05/16/2007  . Subcortical infarction ([place]) 05/16/2007  . Hyperlipidemia 03/30/2007  . DEGENERATIVE JOINT DISEASE 03/30/2007  . PAROTID LESION, UNSPECIFIED 09/29/2006  . Partial  epilepsy with impairment of consciousness ([place]) 09/29/2006  . Diabetic polyneuropathy ([place]) 09/29/2006  . Essential hypertension 09/29/2006  . PERIPHERAL VASCULAR DISEASE 09/29/2006  . DIVERTICULOSIS 09/29/2006    Expected Discharge Date: Expected Discharge Date: 09/08/17  Team Members Present: Physician leading conference: Dr. Alysia Penna Social Worker Present: Alfonse Alpers, LCSW Nurse Present: Dorthula Nettles, RN PT Present: Michaelene Song, PT OT Present: Cherylynn Ridges, OT SLP Present: Weston Anna, SLP PPS Coordinator present : Daiva Nakayama, RN, CRRN     Current Status/Progress Goal Weekly Team Focus  Medical   Diabetic control improving,   maintain med stability, reduce fall risk  management of diabetic    Bowel/Bladder   continent of b/b, LBM 6/17, uses urinal with assist  maintain b/b with min A  monitor b/b q shift and prn   Swallow/Nutrition/ Hydration             ADL's   max A squat pivot transfers, min A sit to stand, mod A static stand balance, min A of 10% with donning shirt, mod A with pants, total socks/ shoes, mod A toileting, min A bathing with AE  Min A  L NMR, NMES, transfer training, sit >< stand, ADL training   Mobility   mod-max assist transfers and gait 30'  Min assist, gait short distance  all functional mobility, discharge planning, L NMR   Communication             Safety/Cognition/ Behavioral Observations            Pain   no c/o of pain  pain </= 2  monitor pain q shift and prn  Skin   not skin breakdown, bruising to abdomen from lovenox injections  maintain skin with mod I A  monitor q shfit and prn    Rehab Goals Patient on target to meet rehab goals: Yes Rehab Goals Revised: none *See Care Plan and progress notes for long and short-term goals.     Barriers to Discharge  Current Status/Progress Possible Resolutions Date Resolved   Physician    Medical stability  Severe hemiparesis, little improvment  Progressing   Cont  rehab, family training      Nursing                  PT  Inaccessible home environment;Home environment access/layout  2 steps to enter home  family discussing ramp, need to begin pursuing this seriously           OT                  SLP                SW                Discharge Planning/Teaching Needs:  Pt plans to return to his home with his wife.  Family members plans to pull together to be with pt/wife 24/7, with pt needing min A.  Family education to be scheduled next Tuesday and Thursday with 4 different family members.   Team Discussion:  Pt is doing pretty well, but is not getting much return on his left side.  Does not have any medical issues.  Pt is continent and eating fairly.  OT doing estim to arm and weightbearing.  Has some tone in arm.  Pt is mod to max A; min A standing, but pushing still.  Pt is working hard and using adaptive equipment.  Pt is min A bed mobility; mod A with hemi walker; gait at rail is mod A, but pt needs more assistance with walker.  Ortho consult done.  Revisions to Treatment Plan:  ST d/c'd pt from services this past week.    Continued Need for Acute Rehabilitation Level of Care: The patient requires daily medical management by a physician with specialized training in physical medicine and rehabilitation for the following conditions: Daily direction of a multidisciplinary physical rehabilitation program to ensure safe treatment while eliciting the highest outcome that is of practical value to the patient.: Yes Daily medical management of patient stability for increased activity during participation in an intensive rehabilitation regime.: Yes Daily analysis of laboratory values and/or radiology reports with any subsequent need for medication adjustment of medical intervention for : Post surgical problems;Other  Leilani Cespedes, Silvestre Mesi 08/31/2017, 8:03 PM

## 2017-08-31 NOTE — Progress Notes (Signed)
[place] PHYSICAL MEDICINE & REHABILITATION     PROGRESS NOTE  Subjective/Complaints:   No new issues overnight Some slurring of speech noted this morning but this seems to have improved after breakfast  ROS:No CP, SOB, bowel or bladder issues Objective: Vital Signs: Blood pressure (!) 109/44, pulse 67, temperature (!) 97.5 F (36.4 C), temperature source Oral, resp. rate 18, height 5\' 3"  (1.6 m), weight 86.2 kg (190 lb), SpO2 100 %. No results found. No results for input(s): WBC, HGB, HCT, PLT in the last 72 hours. Recent Labs    08/31/17 0935  NA 143  K 4.2  CL 108  GLUCOSE 163*  BUN 15  CREATININE 1.11  CALCIUM 9.6   CBG (last 3)  Recent Labs    08/30/17 2057 08/31/17 0650 08/31/17 1116  GLUCAP 146* 110* 199*    Wt Readings from Last 3 Encounters:  08/31/17 86.2 kg (190 lb)  08/10/17 84.4 kg (186 lb)  07/18/17 87.1 kg (192 lb)    Physical Exam:  BP (!) 109/44 (BP Location: Right Arm)   Pulse 67   Temp (!) 97.5 F (36.4 C) (Oral)   Resp 18   Ht 5\' 3"  (1.6 m)   Wt 86.2 kg (190 lb)   SpO2 100%   BMI 33.66 kg/m  Constitutional: No distress . Vital signs reviewed. HEENT: EOMI, oral membranes moist Neck: supple Cardiovascular: RRR without murmur. No JVD    Respiratory: CTA Bilaterally without wheezes or rales. Normal effort    GI: BS +, non-tender, non-distended  Musculoskeletal: He exhibits no edema or tenderness. Neurological: He is alert and oriented 3 Follows full commands.  Fair awareness of deficits.  Motor: LUE 0/5 remains LLE 1/5 hip add, 1/5 distally at the ankle dorsiflexors 0 at the ankle plantar flexors no resting tone Skin: Skin is warm and dry.  Psychiatric: He has a normal mood and affect. His behavior is normal.  ext 1+ edema BLE, L>R Assessment/Plan: 1. 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 Physiatrist is providing close team supervision and 24 hour management of active medical problems listed below. Physiatrist and rehab team continue to assess barriers to discharge/monitor patient progress toward functional and medical goals.  Function:  Bathing Bathing position   Position: Shower  Bathing parts Body parts bathed by patient: Chest, Abdomen, Right upper leg, Left upper leg, Front perineal area, Left arm, Right arm, Right lower leg, Left lower leg, Back(using long bath sponge) Body parts bathed by helper: Buttocks  Bathing assist Assist Level: Touching or steadying assistance(Pt > 75%)      Upper Body Dressing/Undressing Upper body dressing   What is the patient wearing?: Pull over shirt/dress     Pull over shirt/dress - Perfomed by patient: Thread/unthread left sleeve, Thread/unthread right sleeve, Put head through opening, Pull shirt over trunk Pull over shirt/dress - Perfomed by helper: Pull shirt over trunk        Upper body assist Assist Level: Touching or steadying assistance(Pt > 75%)      Lower Body Dressing/Undressing Lower body dressing   What is the patient wearing?: Pants, Socks, Shoes   Underwear - Performed by helper: Thread/unthread right underwear leg, Thread/unthread left underwear leg, Pull underwear up/down Pants- Performed by patient: Thread/unthread right pants leg Pants- Performed by helper: Thread/unthread left pants leg, Pull pants up/down(pt helps with pulling left pants over leg 50% ,  over hips 25%)   Non-skid slipper socks- Performed by helper: Don/doff right sock, Don/doff left sock   Socks - Performed by helper: Don/doff right sock, Don/doff left sock   Shoes - Performed by helper: Don/doff right shoe, Don/doff left shoe          Lower body assist Assist for lower body dressing: Touching or steadying assistance (Pt > 75%)      Toileting Toileting Toileting activity did not occur: No continent bowel/bladder  event Toileting steps completed by patient: Performs perineal hygiene Toileting steps completed by helper: Adjust clothing prior to toileting, Adjust clothing after toileting    Toileting assist Assist level: Touching or steadying assistance (Pt.75%)   Transfers Chair/bed transfer   Chair/bed transfer method: Stand pivot Chair/bed transfer assist level: Moderate assist (Pt 50 - 74%/lift or lower) Chair/bed transfer assistive device: Other(hemiwalker) Mechanical lift: Ecologist     Max distance: 15 ft Assist level: Maximal assist (Pt 25 - 49%)   Wheelchair   Type: Manual Max wheelchair distance: 150' Assist Level: Supervision or verbal cues  Cognition Comprehension Comprehension assist level: Follows basic conversation/direction with extra time/assistive device  Expression Expression assist level: Expresses basic needs/ideas: With extra time/assistive device  Social Interaction Social Interaction assist level: Interacts appropriately with others with medication or extra time (anti-anxiety, antidepressant).  Problem Solving Problem solving assist level: Solves basic problems with no assist  Memory Memory assist level: More than reasonable amount of time    Medical Problem List and Plan:  1. 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 Aspirin and Plavix x3 weeks then aspirin alone will plan to D/C Plavix today 6/20       Team conf in am                    2. DVT Prophylaxis/Anticoagulation: Subcutaneous Lovenox  3. Pain Management: Tylenol as needed  4. Mood: Provide emotional support  5. Neuropsych: This patient is capable of making decisions on his own behalf.  6. Skin/Wound Care: Routine skin checks  7. Fluids/Electrolytes/Nutrition: Routine in and outs   Most meals 100%, I 614ml on 6/18 8. CAD with CABG. Continue aspirin and Plavix. No chest pain or shortness of breath  9.  Diabetes mellitus. Hemoglobin A1c 6.0. SSI. Check blood sugars before meals and at bedtime Home meds  Glucophage 1000 mg twice daily, Amaryl 4 mg daily, Lantus insulin 10 units nightly, Tradjenta 5 mg daily  CBG (last 3)  Recent Labs    08/30/17 2057 08/31/17 0650 08/31/17 1116  GLUCAP 146* 110* 199*  Controlled 6/20 Tradjenta 5mg ,metformin 500mg  BID 10. Dysphagia. Dysphasia #2 nectar liquids. Follow-up speech therapy  11. Hypertension. Cozaar 25 mg daily, Toprol 50 mg daily  Vitals:   08/31/17 1450 08/31/17 1451  BP:  (!) 109/44  Pulse:  67  Resp:  18  Temp: (!) 97.5 F (36.4 C)   SpO2:  100%  Controlled 6/20 12. Diastolic congestive heart failure. Monitor for any signs of fluid overload. Patient was taking Lasix 40 mg daily if needed for fluid retention prior to admission.   No SOB, restarted low dose Lasix yesterday, potassium normal on 08/31/2017    Filed Weights   08/29/17 0442 08/30/17 0439 08/31/17 0650  Weight: 86.3 kg (190 lb 4.1 oz) 86.2 kg (190 lb 0.6 oz) 86.2 kg (190 lb)  Weights are stable 13. History of seizure disorder. Depakote 500 mg daily.  14. Hyperlipidemia. Lipitor  15. History of alcohol use. Monitor for withdrawal  16.  Elevated Creat need to monitor given pt on Metformin 17.  Constipation-improved, reduced senna to 1po BID,Continent of stool continue Bisacodyl supp prn   LOS (Days) 20 A FACE TO FACE EVALUATION WAS PERFORMED  Charlett Blake 08/31/2017 4:02 PM

## 2017-08-31 NOTE — Progress Notes (Signed)
Social Work Patient ID: Fred [name], male   DOB: 23-Oct-1935, 82 y.o.   MRN: [id number]   CSW met with pt and his wife to update them on team conference discussion and later with his granddtr.  They are prepared for him to come home on 09-08-17 and will have someone with pt 24/7.  Family plans to come in next week for education.  Pt is pleased with progress and was even able to move his thumb and shoulder some while CSW was talking with him.  CSW will continue to follow and will assist with [facility] and DME arrangements.

## 2017-08-31 NOTE — Plan of Care (Signed)
  Problem: Consults Goal: RH STROKE PATIENT EDUCATION Description See Patient Education module for education specifics  Outcome: Progressing   Problem: RH BOWEL ELIMINATION Goal: RH STG MANAGE BOWEL W/MEDICATION W/ASSISTANCE Description STG Manage Bowel with Medication with  Mod I Assistance.  Outcome: Progressing   Problem: RH BLADDER ELIMINATION Goal: RH STG MANAGE BLADDER WITH ASSISTANCE Description STG Manage Bladder With  Min Assistance  Outcome: Progressing   Problem: RH SAFETY Goal: RH STG ADHERE TO SAFETY PRECAUTIONS W/ASSISTANCE/DEVICE Description STG Adhere to Safety Precautions With  Mod cues/ supervision Assistance/Device.   Outcome: Progressing   Problem: RH KNOWLEDGE DEFICIT Goal: RH STG INCREASE KNOWLEDGE OF DIABETES Description Pt will be able to explain how to monitor and manage diabetes/diet/medications using notes/resources independently  Outcome: Progressing Goal: RH STG INCREASE KNOWLEDGE OF HYPERTENSION Description Pt will be able to demo understanding of HH/CMM diet and medications to manage HTN and state what to do to manage HTN and when to take medications  Outcome: Progressing

## 2017-09-01 ENCOUNTER — Inpatient Hospital Stay (HOSPITAL_COMMUNITY): Payer: Medicare Other | Admitting: Physical Therapy

## 2017-09-01 ENCOUNTER — Inpatient Hospital Stay (HOSPITAL_COMMUNITY): Payer: Medicare Other | Admitting: Occupational Therapy

## 2017-09-01 LAB — CREATININE, SERUM
CREATININE: 1.02 mg/dL (ref 0.61–1.24)
GFR calc Af Amer: >60 mL/min (ref >60)
GFR calc non Af Amer: >60 mL/min (ref >60)

## 2017-09-01 LAB — GLUCOSE, CAPILLARY
GLUCOSE-CAPILLARY: 170 mg/dL — AB (ref 65–99)
GLUCOSE-CAPILLARY: 128 mg/dL — AB (ref 65–99)
Glucose-Capillary: 104 mg/dL — ABNORMAL HIGH (ref 65–99)
Glucose-Capillary: 133 mg/dL — ABNORMAL HIGH (ref 65–99)

## 2017-09-01 MED ORDER — FUROSEMIDE 20 MG PO TABS
20.0000 mg | ORAL_TABLET | Freq: Every day | ORAL | Status: DC
  Administered 2017-09-02 – 2017-09-13 (×12): 20 mg via ORAL
  Filled 2017-09-01 (×12): qty 1

## 2017-09-01 NOTE — Progress Notes (Signed)
[place] PHYSICAL MEDICINE & REHABILITATION     PROGRESS NOTE  Subjective/Complaints:   No issues overnite Moving Left side a little more  ROS:No CP, SOB, bowel or bladder issues Objective: Vital Signs: Blood pressure 125/71, pulse 72, temperature 98.5 F (36.9 C), temperature source Oral, resp. rate 19, height 5\' 3"  (1.6 m), weight 86.8 kg (191 lb 5.8 oz), SpO2 99 %. No results found. No results for input(s): WBC, HGB, HCT, PLT in the last 72 hours. Recent Labs    08/31/17 0935 09/01/17 0635  NA 143  --   K 4.2  --   CL 108  --   GLUCOSE 163*  --   BUN 15  --   CREATININE 1.11 1.02  CALCIUM 9.6  --    CBG (last 3)  Recent Labs    08/31/17 1636 08/31/17 2143 09/01/17 0704  GLUCAP 123* 134* 104*    Wt Readings from Last 3 Encounters:  09/01/17 86.8 kg (191 lb 5.8 oz)  08/10/17 84.4 kg (186 lb)  07/18/17 87.1 kg (192 lb)    Physical Exam:  BP 125/71 (BP Location: Left Arm)   Pulse 72   Temp 98.5 F (36.9 C) (Oral)   Resp 19   Ht 5\' 3"  (1.6 m)   Wt 86.8 kg (191 lb 5.8 oz)   SpO2 99%   BMI 33.90 kg/m  Constitutional: No distress . Vital signs reviewed. HEENT: EOMI, oral membranes moist Neck: supple Cardiovascular: RRR without murmur. No JVD    Respiratory: CTA Bilaterally without wheezes or rales. Normal effort    GI: BS +, non-tender, non-distended  Musculoskeletal: He exhibits no edema or tenderness. Neurological: He is alert and oriented 3 Follows full commands.  Fair awareness of deficits.  Motor: LUE 1/5 finger flex/ext elbow flex ext LLE 2-/5 hip add, 2/5 distally at the ankle dorsiflexors 2- at the ankle plantar flexors no resting tone, 2- hip/knee ext Skin: Skin is warm and dry.  Psychiatric: He has a normal mood and affect. His behavior is normal.  ext 1+ edema BLE, L>R Assessment/Plan: 1. 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 Physiatrist is providing close team supervision and 24 hour management of active medical problems listed below. Physiatrist and rehab team continue to assess barriers to discharge/monitor patient progress toward functional and medical goals.  Function:  Bathing Bathing position   Position: Shower  Bathing parts Body parts bathed by patient: Chest, Abdomen, Right upper leg, Left upper leg, Front perineal area, Left arm, Right arm, Right lower leg, Left lower leg, Back(using long bath sponge) Body parts bathed by helper: Buttocks  Bathing assist Assist Level: Touching or steadying assistance(Pt > 75%)      Upper Body Dressing/Undressing Upper body dressing   What is the patient wearing?: Pull over shirt/dress     Pull over shirt/dress - Perfomed by patient: Thread/unthread left sleeve, Thread/unthread right sleeve, Put head through opening, Pull shirt over trunk Pull over shirt/dress - Perfomed by helper: Pull shirt over trunk        Upper body assist Assist Level: Touching or steadying assistance(Pt > 75%)      Lower Body Dressing/Undressing Lower body dressing   What is the patient wearing?: Pants, Socks, Shoes   Underwear - Performed by helper: Thread/unthread right underwear leg, Thread/unthread left underwear leg, Pull underwear up/down Pants- Performed by patient: Thread/unthread right pants leg  Pants- Performed by helper: Thread/unthread left pants leg, Pull pants up/down(pt helps with pulling left pants over leg 50% , over hips 25%)   Non-skid slipper socks- Performed by helper: Don/doff right sock, Don/doff left sock   Socks - Performed by helper: Don/doff right sock, Don/doff left sock   Shoes - Performed by helper: Don/doff right shoe, Don/doff left shoe          Lower body assist Assist for lower body dressing: Touching or steadying assistance (Pt > 75%)      Toileting Toileting Toileting activity did not occur: No  continent bowel/bladder event Toileting steps completed by patient: Performs perineal hygiene Toileting steps completed by helper: Adjust clothing prior to toileting, Adjust clothing after toileting    Toileting assist Assist level: Touching or steadying assistance (Pt.75%)   Transfers Chair/bed transfer   Chair/bed transfer method: Stand pivot Chair/bed transfer assist level: Moderate assist (Pt 50 - 74%/lift or lower) Chair/bed transfer assistive device: Armrests Mechanical lift: Stedy   Locomotion Ambulation     Max distance: 30' Assist level: Moderate assist (Pt 50 - 74%)   Wheelchair   Type: Manual Max wheelchair distance: 150' Assist Level: Supervision or verbal cues  Cognition Comprehension Comprehension assist level: Follows basic conversation/direction with extra time/assistive device  Expression Expression assist level: Expresses basic needs/ideas: With extra time/assistive device  Social Interaction Social Interaction assist level: Interacts appropriately with others with medication or extra time (anti-anxiety, antidepressant).  Problem Solving Problem solving assist level: Solves basic problems with no assist  Memory Memory assist level: More than reasonable amount of time    Medical Problem List and Plan:  1. 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 Aspirin and Plavix x3 weeks then aspirin alone off  Plavix  6/20       Team conf in am                    2. DVT Prophylaxis/Anticoagulation: Subcutaneous Lovenox  3. Pain Management: Tylenol as needed  4. Mood: Provide emotional support  5. Neuropsych: This patient is capable of making decisions on his own behalf.  6. Skin/Wound Care: Routine skin checks  7. Fluids/Electrolytes/Nutrition: Routine in and outs   Most meals 100%, I 732ml on 6/20 8. CAD with CABG. Continue aspirin and Plavix. No chest pain or shortness of breath  9. Diabetes  mellitus. Hemoglobin A1c 6.0. SSI. Check blood sugars before meals and at bedtime Home meds  Glucophage 1000 mg twice daily, Amaryl 4 mg daily, Lantus insulin 10 units nightly, Tradjenta 5 mg daily  CBG (last 3)  Recent Labs    08/31/17 1636 08/31/17 2143 09/01/17 0704  GLUCAP 123* 134* 104*  Controlled 6/21 Tradjenta 5mg ,metformin 500mg  BID 10. Dysphagia. Dysphasia #2 nectar liquids. Follow-up speech therapy  11. Hypertension. Cozaar 25 mg daily, Toprol 50 mg daily  Vitals:   08/31/17 2144 09/01/17 0608  BP: 129/65 125/71  Pulse: 72 72  Resp: 17 19  Temp: 98.6 F (37 C) 98.5 F (36.9 C)  SpO2: 98% 99%  Controlled 6/20 12. Diastolic congestive heart failure. Monitor for any signs of fluid overload. Patient was taking Lasix 40 mg daily if needed for fluid retention prior to admission.   No SOB, restarted low dose Lasix 10mg  still with 1+ edema will increase to 20mg , potassium normal on 09/01/2017    Filed Weights   08/30/17 0439 08/31/17 0650 09/01/17 0608  Weight: 86.2 kg (190  lb 0.6 oz) 86.2 kg (190 lb) 86.8 kg (191 lb 5.8 oz)  Weights are stable 13. History of seizure disorder. Depakote 500 mg daily.  14. Hyperlipidemia. Lipitor  15. History of alcohol use. Monitor for withdrawal  16.  Elevated Creat need to monitor given pt on Metformin 17.  Constipation-improved, reduced senna to 1po BID,Continent of stool continue Bisacodyl supp prn   LOS (Days) 21 A FACE TO FACE EVALUATION WAS PERFORMED  Charlett Blake 09/01/2017 8:06 AM

## 2017-09-01 NOTE — Progress Notes (Addendum)
Occupational Therapy Session Note  Patient Details  Name: Fred [name] MRN: [id number] Date of Birth: 06/24/1935  Today's Date: 09/01/2017  Session 1 OT Individual Time: 1100-1200 OT Individual Time Calculation (min): 60 min    Session 2 OT Individual Time: 1304-1345 OT Individual Time Calculation (min): 41 min    Short Term Goals: Week 3:  OT Short Term Goal 1 (Week 3): Pt will maintain standing balance with no more than Mod A while pulling pants up over hips OT Short Term Goal 2 (Week 3): Pt will complete shower transfer with mod A, OT Short Term Goal 3 (Week 3): Pt will demonstrate proficiency in self ROM to L UE  Skilled Therapeutic Interventions/Progress Updates:  Session 1   Pt greeted seated in wc and agreeable to OT. Pt declined bathing/dressing today and stated he had already been to the bathroom. Wc propulsion to therapy gym with increased time. Tried slideboard transfer from wc to therapy mat on R side with max A and facilitation for head/hips relationship. Pt brought into gravity eliminated sidelying position. OT provided joint input to elbow and wrist to facilitate shoulder flex/ext and elbow flex/ext.  Utilized maxislide mat under L UE to decrease friction with NMR. Pt able to slightly activate trace biceps. Applied e-stim to biceps to facilitate elbow flexion. Next applied 1:1 NMES to CH1 supraspinatus and middle deltoid to help approximate shoulder joint, and Ch 2 wrist extensors.  Ratio 1:1 Rate 35 pps Waveform- Asymmetric Ramp 1.0 Pulse 300 CH1 Intensity- 18 Duration -  15  CH2 Intensity- 25 Duration -  15  Pt completed squat-pivot to return to wc on R side with Mod A. Pt returned to room and left seated in wc with safety belt on and needs met.   Session 2 Pt greeted seated in wc at the gym handoff from PT. Sit<>stand at raised table with mod A to power up. Weight bearing towel pushes in standing with facilitation for shoulder flex/ext. Returned to  sitting and continued neuro re-ed with cup stacking activity. Pt needed assistance to extend fingers, then was able to grasp cup and slide 1/4-1/2 across table towards stack of cups. OT then provided hand over hand to lift R UE and stack cups. Pt returned to room at end of session and left seated in wc with safety belt on and family present.   Therapy Documentation Precautions:  Precautions Precautions: Fall Precaution Comments: L sided weakness Restrictions Weight Bearing Restrictions: No Pain:  none/denies pain ADL: ADL ADL Comments: see functional navigator  See Function Navigator for Current Functional Status.   Therapy/Group: Individual Therapy   S  09/01/2017, 1:50 PM  

## 2017-09-01 NOTE — Progress Notes (Signed)
Physical Therapy Session Note  Patient Details  Name: Fred [name] MRN: [id number] Date of Birth: 05/17/1935  Today's Date: 09/01/2017 PT Individual Time: 0830-0900 AND 1235-1300 AND 1500-1541 PT Individual Time Calculation (min): 30 min AND 25 min AND 41 min  Short Term Goals: Week 3:  PT Short Term Goal 1 (Week 3): =LTGs due to ELOS  Skilled Therapeutic Interventions/Progress Updates:   Session 1:  Pt in w/c and agreeable to therapy, denies pain. Session focused on NMR w/ functional tasks and in standing. Transferred to EOB w/ supervision and to w/c via stand pivot w/ min-mod assist. Total assist to don shoes and LAFO. Performed multiple transfers to/from mat in therapy gym w/ min-mod assist as well, noticeably improved. Performed sit<>stands to hemiwalker from mat w/ min assist required to maintain midline, much improved from yesterday's session when pt require max assist. Worked on pregait w/ R and L forward and backward stepping w/ manual cues for L quad activation and L hip flexion. Intermittent attempts at standing balance w/o UE support for 2-3 sec at a time w/ min guard. Returned to room and ended session in w/c and in care of family, all needs met.   Session 2:  Pt in w/c and agreeable to therapy, denies pain. Session focused on gait training in gym. Ambulated 25' x2 bouts w/ mod-max assist using hemiwalker. Assist required to facilitate lateral weight shifting, verbal cues for gait pattern, and total assist for LLE placement. Improved L swing limb advancement w/ addition of surgical bootie to help slide. Max assist stand pivot turns to sit in chair at end of each gait bout. Ended session in care of daughter, awaiting OT session while seated in w/c in gym.   Session 2:  Pt in w/c and agreeable to therapy, denies pain. Pt self-propelled w/c to/from day room w/ supervision. Worked on LLE NMR and muscle activation this session. Performed NuStep 15 min @ level 2 w/ intermittent bouts of  LE only to increase LLE use. Min manual and verbal cues for neutral LLE alignment and occasional tactile cues for L quad activation. Returned to room and ended session in w/c, call bell within reach and all needs met. Discussed home set-up w/ daughter and granddaughter, both confirm plans to be present for therapy sessions next week for education.   Therapy Documentation Precautions:  Precautions Precautions: Fall Precaution Comments: L sided weakness Restrictions Weight Bearing Restrictions: No Vital Signs: Therapy Vitals Temp: 98.5 F (36.9 C) Temp Source: Oral Pulse Rate: 72 Resp: 19 BP: 125/71 Patient Position (if appropriate): Lying Oxygen Therapy SpO2: 99 % O2 Device: Room Air Pain: Pain Assessment Pain Scale: 0-10 Pain Score: 0-No pain  See Function Navigator for Current Functional Status.   Therapy/Group: Individual Therapy   K Arnette 09/01/2017, 9:33 AM  

## 2017-09-02 ENCOUNTER — Inpatient Hospital Stay (HOSPITAL_COMMUNITY): Payer: Medicare Other | Admitting: Occupational Therapy

## 2017-09-02 LAB — GLUCOSE, CAPILLARY
GLUCOSE-CAPILLARY: 123 mg/dL — AB (ref 65–99)
Glucose-Capillary: 113 mg/dL — ABNORMAL HIGH (ref 65–99)
Glucose-Capillary: 219 mg/dL — ABNORMAL HIGH (ref 65–99)
Glucose-Capillary: 139 mg/dL — ABNORMAL HIGH (ref 65–99)

## 2017-09-02 LAB — BASIC METABOLIC PANEL
Anion gap: 8 (ref 5–15)
BUN: 19 mg/dL (ref 6–20)
CALCIUM: 9.1 mg/dL (ref 8.9–10.3)
CO2: 26 mmol/L (ref 22–32)
CREATININE: 1 mg/dL (ref 0.61–1.24)
Chloride: 108 mmol/L (ref 101–111)
GFR calc Af Amer: >60 mL/min (ref >60)
Glucose, Bld: 127 mg/dL — ABNORMAL HIGH (ref 65–99)
Potassium: 4.3 mmol/L (ref 3.5–5.1)
Sodium: 142 mmol/L (ref 135–145)

## 2017-09-02 MED ORDER — DICLOFENAC SODIUM 1 % TD GEL
2.0000 g | Freq: Four times a day (QID) | TRANSDERMAL | Status: DC
  Administered 2017-09-02 – 2017-09-12 (×37): 2 g via TOPICAL
  Filled 2017-09-02: qty 100

## 2017-09-02 NOTE — Progress Notes (Signed)
[facility]     PROGRESS NOTE  Subjective/Complaints:   Patient is hungry this morning.  He had therapy earlier. No complaints today Review of systems [place] for chest pain shortness of breath nausea vomiting diarrhea constipation  ROS:No CP, SOB, bowel or bladder issues Objective: Vital Signs: Blood pressure (!) 125/59, pulse 72, temperature 98.2 F (36.8 C), temperature source Oral, resp. rate 17, height 5\' 3"  (1.6 m), weight 83.3 kg (183 lb 10.3 oz), SpO2 97 %. No results found. No results for input(s): WBC, HGB, HCT, PLT in the last 72 hours. Recent Labs    08/31/17 0935 09/01/17 0635 09/02/17 0719  NA 143  --  142  K 4.2  --  4.3  CL 108  --  108  GLUCOSE 163*  --  127*  BUN 15  --  19  CREATININE 1.11 1.02 1.00  CALCIUM 9.6  --  9.1   CBG (last 3)  Recent Labs    09/01/17 2122 09/02/17 0639 09/02/17 1216  GLUCAP 128* 113* 219*    Wt Readings from Last 3 Encounters:  09/02/17 83.3 kg (183 lb 10.3 oz)  08/10/17 84.4 kg (186 lb)  07/18/17 87.1 kg (192 lb)    Physical Exam:  BP (!) 125/59 (BP Location: Right Arm)   Pulse 72   Temp 98.2 F (36.8 C) (Oral)   Resp 17   Ht 5\' 3"  (1.6 m)   Wt 83.3 kg (183 lb 10.3 oz)   SpO2 97%   BMI 32.53 kg/m  Constitutional: No distress . Vital signs reviewed. HEENT: EOMI, oral membranes moist Neck: supple Cardiovascular: RRR without murmur. No JVD    Respiratory: CTA Bilaterally without wheezes or rales. Normal effort    GI: BS +, non-tender, non-distended  Musculoskeletal: He exhibits no edema or tenderness. Neurological: He is alert and oriented 3 Follows full commands.  Fair awareness of deficits.  Motor: LUE 1/5 finger flex/ext elbow flex ext LLE 2-/5 hip add, 2/5 distally at the ankle dorsiflexors 2- at the ankle plantar flexors no resting tone, 2- hip/knee ext Skin: Skin is warm and dry.  Psychiatric: He has a normal mood and affect. His behavior is normal.  ext 1+ edema  BLE, L>R Assessment/Plan: 1. 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 Physiatrist is providing close team supervision and 24 hour management of active medical problems listed below. Physiatrist and rehab team continue to assess barriers to discharge/monitor patient progress toward functional and medical goals.  Function:  Bathing Bathing position   Position: Shower  Bathing parts Body parts bathed by patient: Chest, Abdomen, Right upper leg, Left upper leg, Front perineal area, Left arm, Right arm, Right lower leg, Left lower leg, Back(using long bath sponge) Body parts bathed by helper: Buttocks  Bathing assist Assist Level: Touching or steadying assistance(Pt > 75%)      Upper Body Dressing/Undressing Upper body dressing   What is the patient wearing?: Pull over shirt/dress     Pull over shirt/dress - Perfomed by patient: Thread/unthread left sleeve, Thread/unthread right sleeve, Put head through opening, Pull shirt over trunk Pull over shirt/dress - Perfomed by helper: Pull shirt over trunk        Upper body assist Assist Level: Touching or steadying assistance(Pt > 75%)      Lower Body Dressing/Undressing Lower body dressing   What is the patient wearing?:  Pants, Socks, Shoes   Underwear - Performed by helper: Thread/unthread right underwear leg, Thread/unthread left underwear leg, Pull underwear up/down Pants- Performed by patient: Thread/unthread right pants leg Pants- Performed by helper: Thread/unthread left pants leg, Pull pants up/down(pt helps with pulling left pants over leg 50% , over hips 25%)   Non-skid slipper socks- Performed by helper: Don/doff right sock, Don/doff left sock   Socks - Performed by helper: Don/doff right sock, Don/doff left sock   Shoes - Performed by helper: Don/doff right shoe, Don/doff left  shoe          Lower body assist Assist for lower body dressing: Touching or steadying assistance (Pt > 75%)      Toileting Toileting Toileting activity did not occur: No continent bowel/bladder event Toileting steps completed by patient: Adjust clothing prior to toileting, Adjust clothing after toileting Toileting steps completed by helper: Adjust clothing prior to toileting, Performs perineal hygiene, Adjust clothing after toileting Toileting Assistive Devices: Other (comment)(stedy)  Toileting assist Assist level: Touching or steadying assistance (Pt.75%)   Transfers Chair/bed transfer   Chair/bed transfer method: Stand pivot Chair/bed transfer assist level: Moderate assist (Pt 50 - 74%/lift or lower) Chair/bed transfer assistive device: Armrests Mechanical lift: Stedy   Locomotion Ambulation     Max distance: 25' Assist level: Maximal assist (Pt 25 - 49%)   Wheelchair   Type: Manual Max wheelchair distance: 150' Assist Level: Supervision or verbal cues  Cognition Comprehension Comprehension assist level: Follows basic conversation/direction with extra time/assistive device  Expression Expression assist level: Expresses basic needs/ideas: With no assist  Social Interaction Social Interaction assist level: Interacts appropriately 90% of the time - Needs monitoring or encouragement for participation or interaction.  Problem Solving Problem solving assist level: Solves basic problems with no assist  Memory Memory assist level: More than reasonable amount of time    Medical Problem List and Plan:  1. 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 Aspirin and Plavix x3 weeks then aspirin alone off  Plavix  6/20       Team conf in am                    2. DVT Prophylaxis/Anticoagulation: Subcutaneous Lovenox  3. Pain Management: Tylenol as needed  4. Mood: Provide emotional support  5. Neuropsych: This  patient is capable of making decisions on his own behalf.  6. Skin/Wound Care: Routine skin checks  7. Fluids/Electrolytes/Nutrition: Routine in and outs   Most meals 100%, I 773ml on 6/20 8. CAD with CABG. Continue aspirin and Plavix. No chest pain or shortness of breath  9. Diabetes mellitus. Hemoglobin A1c 6.0. SSI. Check blood sugars before meals and at bedtime Home meds  Glucophage 1000 mg twice daily, Amaryl 4 mg daily, Lantus insulin 10 units nightly, Tradjenta 5 mg daily  CBG (last 3)  Recent Labs    09/01/17 2122 09/02/17 0639 09/02/17 1216  GLUCAP 128* 113* 219*  Controlled 6/22 Tradjenta 5mg ,metformin 500mg  BID, the lunchtime CBG was during meal 10. Dysphagia. Dysphasia #2 nectar liquids. Follow-up speech therapy  11. Hypertension. Cozaar 25 mg daily, Toprol 50 mg daily  Vitals:   09/01/17 2011 09/02/17 0551  BP: (!) 146/68 (!) 125/59  Pulse: 69 72  Resp: 18 17  Temp: 97.8 F (36.6 C) 98.2 F (36.8 C)  SpO2: 100% 97%  Controlled 6/22 12. Diastolic congestive heart failure. Monitor for any signs of fluid overload. Patient was  taking Lasix 40 mg daily if needed for fluid retention prior to admission.   No SOB, restarted low dose Lasix 10mg  still with 1+ edema will increase to 20mg , potassium normal on 09/01/2017, would recheck before discharge    Filed Weights   08/31/17 0650 09/01/17 0608 09/02/17 0551  Weight: 86.2 kg (190 lb) 86.8 kg (191 lb 5.8 oz) 83.3 kg (183 lb 10.3 oz)  Weights are stable 13. History of seizure disorder. Depakote 500 mg daily.  14. Hyperlipidemia. Lipitor  15. History of alcohol use. Monitor for withdrawal  16.  Elevated Creat resolved, remains normal on Metformin 17.  Constipation-improved, reduced senna to 1po BID,Continent of stool continue Bisacodyl supp prn   LOS (Days) 22 A FACE TO FACE EVALUATION WAS PERFORMED  Charlett Blake 09/02/2017 1:35 PM

## 2017-09-02 NOTE — Progress Notes (Signed)
Occupational Therapy Session Note  Patient Details  Name: CASY BRUNETTO MRN: [id number] Date of Birth: 1936-02-25  Today's Date: 09/02/2017 OT Group Time: 1101-1201 OT Group Time Calculation (min): 60 min   Skilled Therapeutic Interventions/Progress Updates:    Pt participated in therapeutic w/c level dance group with focus on UE/LE strengthening, activity tolerance, and social participation for carryover during self care tasks. Pt was guided through various dance-based exercises involving UB/LB and trunk. Exercises were modified to incorporate AAROM of L UE/LE including towel and heel slides. His spouse and dtr were present and actively involved with pt and other group members throughout session. Pt cried cheerfully x2. At end of session dtr escorted him back to room.   Therapy Documentation Precautions:  Precautions Precautions: Fall Precaution Comments: L sided weakness Restrictions Weight Bearing Restrictions: No ADL: ADL ADL Comments: see functional navigator    See Function Navigator for Current Functional Status.   Therapy/Group: Group Therapy  Tupac Jeffus A Jermika Olden 09/02/2017, 12:32 PM

## 2017-09-03 ENCOUNTER — Inpatient Hospital Stay (HOSPITAL_COMMUNITY): Payer: Medicare Other | Admitting: Occupational Therapy

## 2017-09-03 LAB — GLUCOSE, CAPILLARY
GLUCOSE-CAPILLARY: 96 mg/dL (ref 65–99)
GLUCOSE-CAPILLARY: 131 mg/dL — AB (ref 65–99)
Glucose-Capillary: 165 mg/dL — ABNORMAL HIGH (ref 65–99)
Glucose-Capillary: 129 mg/dL — ABNORMAL HIGH (ref 65–99)

## 2017-09-03 NOTE — Progress Notes (Signed)
Occupational Therapy Session Note  Patient Details  Name: Fred [name] MRN: [id number] Date of Birth: 11-Dec-1935  Today's Date: 09/03/2017 OT Individual Time: 1005-1102 OT Individual Time Calculation (min): 57 min   Short Term Goals: Week 3:  OT Short Term Goal 1 (Week 3): Pt will maintain standing balance with no more than Mod A while pulling pants up over hips OT Short Term Goal 2 (Week 3): Pt will complete shower transfer with mod A, OT Short Term Goal 3 (Week 3): Pt will demonstrate proficiency in self ROM to L UE  Skilled Therapeutic Interventions/Progress Updates:   Pt greeted supine in bed, agreeable to bathing/dressing w/c level at sink. Tx focus on Lt NMR, functional transfers, and balance. Squat pivot<w/c completed with Max A to Lt and Lt side supported. Educated pt on hemi techniques for bathing/dressing tasks. Pt limited with R LE mobility secondary to increased pain. Very difficult for him to come into full standing at sink because of this. Pushed strongly towards Lt when OT removed pajama bottoms (in squat-stand). Sit<stand in [place] with Max A for elevating clean pants over hips. He required tactile/verbal cues for improving midline orientation due to pushing tendencies. Also had him attend to visual feedback from mirror. He required Max A overall during session, however actively assisted during each step of ADL task. At end of tx he remained in w/c with all needs within reach and half lap tray.   Therapy Documentation Precautions:  Precautions Precautions: Fall Precaution Comments: L sided weakness Restrictions Weight Bearing Restrictions: No Pain: Rt knee pain. Pt reported he was premedicated at start of tx. Provided pt with ice pack at end of tx. RN made aware.  Pain Assessment Pain Scale: 0-10 Pain Score: 0-No pain ADL: ADL ADL Comments: see functional navigator     See Function Navigator for Current Functional Status.   Therapy/Group: Individual  Therapy  Delories Mauri A Kashon Kraynak 09/03/2017, 12:43 PM

## 2017-09-04 ENCOUNTER — Inpatient Hospital Stay (HOSPITAL_COMMUNITY): Payer: Medicare Other | Admitting: Occupational Therapy

## 2017-09-04 ENCOUNTER — Inpatient Hospital Stay (HOSPITAL_COMMUNITY): Payer: Medicare Other

## 2017-09-04 ENCOUNTER — Encounter (HOSPITAL_COMMUNITY): Payer: Medicare Other | Admitting: Psychology

## 2017-09-04 DIAGNOSIS — I69354 Hemiplegia and hemiparesis following cerebral infarction affecting left non-dominant side: Secondary | ICD-10-CM

## 2017-09-04 DIAGNOSIS — M1711 Unilateral primary osteoarthritis, right knee: Secondary | ICD-10-CM

## 2017-09-04 DIAGNOSIS — G8110 Spastic hemiplegia affecting unspecified side: Secondary | ICD-10-CM

## 2017-09-04 LAB — GLUCOSE, CAPILLARY
GLUCOSE-CAPILLARY: 127 mg/dL — AB (ref 65–99)
Glucose-Capillary: 171 mg/dL — ABNORMAL HIGH (ref 65–99)
Glucose-Capillary: 182 mg/dL — ABNORMAL HIGH (ref 65–99)
Glucose-Capillary: 128 mg/dL — ABNORMAL HIGH (ref 65–99)

## 2017-09-04 LAB — URIC ACID: Uric Acid, Serum: 8.8 mg/dL — ABNORMAL HIGH (ref 4.4–7.6)

## 2017-09-04 NOTE — Progress Notes (Signed)
Complained of right knee pain, declined ice, heat or PRN tylenol. "I just need a knee replacement."Fred [name] A

## 2017-09-04 NOTE — Progress Notes (Signed)
Physical Therapy Session Note  Patient Details  Name: Fred [name] MRN: [id number] Date of Birth: 1936/01/10  Today's Date: 09/04/2017 PT Individual Time: 0802-0900 and 1415-1500 PT Individual Time Calculation (min): 58 min and 45 min   Short Term Goals: Week 3:  PT Short Term Goal 1 (Week 3): =LTGs due to ELOS  Skilled Therapeutic Interventions/Progress Updates:    Session 1: Pt supine in bed upon PT arrival, agreeable to therapy tx however reporting increased R knee pain 10/10 with swelling. Pt with tenderness to palpation suprapatellar region, medial and lateral joint lines, and infrapatellar region. Pt unable to tolerate R knee flexion/extension passive ROM secondary to increased pain. Therapist applied ice to R knee and educated family Sports coach) on findings. Pt worked on L NMR in supine this session as pt unable to get OOB secondary to pain. Pt performed L LE NMR exercises: 2 x 10 SAQ, 2 x 10 active assisted hip abduction/adduction and hip flexion, 2 x 10 hip extension against manual resistance. Therapist educated family on these exercises to perform at home and in hospital for HEP. Therapist also performed manual L UE and L LE stretches including: biceps stretch, finger extension stretch, heel cord stretch and hamstring stretch. Educated family on performing this stretches at home up to 3x per day for 1 min hold for HEP. Therapist educated pt and family to continue icing R knee throughout the day for pain relief, leaving ice on for about 20-30 minutes at a time. Pt reports some pain relief by the end of the session after icing R knee, however pt still unable to tolerate R knee PROM.   Session 2:  Pt supine in bed with OT upon PT arrival, agreeable to therapy tx and reports improved R knee pain since this AM. Pt transferred to sitting EOB with mod assist and increased time, stand pivot transfer from bed>w/c towards the R with max assist +2. Pt's R knee with decreased pain  however continues to limit pt with all functional mobility this session. Pt received hand splints and PRAFO, educated family on use of these while in the hospital and at home. Pt transported to the gym. Pt attempted to performed sit<>stand at R handrail however unable to perform despite max assist. Pt performed slideboard transfer in each direction from w/c<>mat with max assist. Pt transported back to room and left seated with ice applied to R knee, family present.   Therapy Documentation Precautions:  Precautions Precautions: Fall Precaution Comments: L sided weakness Restrictions Weight Bearing Restrictions: No   See Function Navigator for Current Functional Status.   Therapy/Group: Individual Therapy  Netta Corrigan, PT, DPT 09/04/2017, 7:42 AM

## 2017-09-04 NOTE — Progress Notes (Signed)
+/-   sleep last night, patient without specific complaint of.  Requires assistance with urinal. Low grade temp this AM. Fred [name] A

## 2017-09-04 NOTE — Plan of Care (Signed)
  Problem: Consults Goal: RH STROKE PATIENT EDUCATION Description See Patient Education module for education specifics  Outcome: Progressing   Problem: RH BOWEL ELIMINATION Goal: RH STG MANAGE BOWEL W/MEDICATION W/ASSISTANCE Description STG Manage Bowel with Medication with  Mod I Assistance.  Outcome: Progressing   Problem: RH BLADDER ELIMINATION Goal: RH STG MANAGE BLADDER WITH ASSISTANCE Description STG Manage Bladder With  Min Assistance  Outcome: Progressing   Problem: RH SAFETY Goal: RH STG ADHERE TO SAFETY PRECAUTIONS W/ASSISTANCE/DEVICE Description STG Adhere to Safety Precautions With  Mod cues/ supervision Assistance/Device.   Outcome: Progressing   Problem: RH KNOWLEDGE DEFICIT Goal: RH STG INCREASE KNOWLEDGE OF DIABETES Description Pt will be able to explain how to monitor and manage diabetes/diet/medications using notes/resources independently  Outcome: Progressing Goal: RH STG INCREASE KNOWLEDGE OF HYPERTENSION Description Pt will be able to demo understanding of HH/CMM diet and medications to manage HTN and state what to do to manage HTN and when to take medications  Outcome: Progressing

## 2017-09-04 NOTE — Progress Notes (Signed)
Orthopedic Tech Progress Note Patient Details:  Fred [name] 1936/02/12 [id number]  Patient ID: Fred [name], male   DOB: 10/12/1935, 82 y.o.   MRN: [id number]   Fred [name] [employer]anger for left resting hand splint, cock up splint, Prafo boot and AFO. 09/04/2017, 1:08 PM

## 2017-09-04 NOTE — Progress Notes (Addendum)
[facility]YS[facility]ICINE & REHABILITATION     PROGRESS NOTE  Subjective/Complaints:  Patient seen lying in bed this morning.he states he slept well overnight. He states he had a good weekend.  ROS: Denies CP, SOB, nausea, vomiting, diarrhea  Objective: Vital Signs: Blood pressure 124/64, pulse 80, temperature 99.5 F (37.5 C), temperature source Oral, resp. rate 18, height 5\' 3"  (1.6 m), weight 81.7 kg (180 lb 1.9 oz), SpO2 95 %. No results found. No results for input(s): WBC, HGB, HCT, PLT in the last 72 hours. Recent Labs    09/02/17 0719  NA 142  K 4.3  CL 108  GLUCOSE 127*  BUN 19  CREATININE 1.00  CALCIUM 9.1   CBG (last 3)  Recent Labs    09/03/17 1643 09/03/17 2118 09/04/17 0651  GLUCAP 165* 129* 127*    Wt Readings from Last 3 Encounters:  09/04/17 81.7 kg (180 lb 1.9 oz)  08/10/17 84.4 kg (186 lb)  07/18/17 87.1 kg (192 lb)    Physical Exam:  BP 124/64 (BP Location: Right Arm)   Pulse 80   Temp 99.5 F (37.5 C) (Oral)   Resp 18   Ht 5\' 3"  (1.6 m)   Wt 81.7 kg (180 lb 1.9 oz)   SpO2 95%   BMI 31.91 kg/m  Constitutional: No distress . Vital signs reviewed. HENT: Normocephalic.  Atraumatic. Eyes: EOMI. No discharge. Cardiovascular: RRR. No JVD. Respiratory: CTA Bilaterally. Normal effort. GI: BS +. Non-distended. Musc: Right knee with edema and tenderness Neurological: He is alert and oriented 3 Follows full commands.  Fair awareness of deficits.  Motor: LUE: shoulder abduction, elbow flexion/extension 1+/5, hand grip 2+/5 LLE: hip flexion 2+/5, distally trace Increased tone in left upper extremity. Skin: Skin is warm and dry.  Psychiatric: He has a normal mood and affect. His behavior is normal.   Assessment/Plan: 1. 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 Physiatrist is providing close  team supervision and 24 hour management of active medical problems listed below. Physiatrist and rehab team continue to assess barriers to discharge/monitor patient progress toward functional and medical goals.  Function:  Bathing Bathing position   Position: Wheelchair/chair at sink  Bathing parts Body parts bathed by patient: Chest, Abdomen, Right upper leg, Left upper leg, Front perineal area Body parts bathed by helper: Right lower leg, Left lower leg, Back, Right arm, Left arm  Bathing assist Assist Level: Touching or steadying assistance(Pt > 75%)      Upper Body Dressing/Undressing Upper body dressing   What is the patient wearing?: Pull over shirt/dress     Pull over shirt/dress - Perfomed by patient: Thread/unthread left sleeve, Thread/unthread right sleeve, Put head through opening, Pull shirt over trunk Pull over shirt/dress - Perfomed by helper: Pull shirt over trunk        Upper body assist Assist Level: Supervision or verbal cues      Lower Body Dressing/Undressing Lower body dressing   What is the patient wearing?: Pants, Socks, Shoes   Underwear - Performed by helper: Thread/unthread right underwear leg, Thread/unthread left underwear leg, Pull underwear up/down Pants- Performed by patient: Thread/unthread right pants leg Pants- Performed by helper: Thread/unthread left pants leg, Pull pants up/down, Thread/unthread right pants leg   Non-skid slipper socks- Performed by helper: Don/doff right sock, Don/doff left sock   Socks - Performed by helper: Don/doff right sock, Don/doff  left sock   Shoes - Performed by helper: Don/doff right shoe, Don/doff left shoe, Fasten right, Fasten left          Lower body assist Assist for lower body dressing: Touching or steadying assistance (Pt > 75%)      Toileting Toileting Toileting activity did not occur: No continent bowel/bladder event Toileting steps completed by patient: Adjust clothing prior to toileting, Adjust  clothing after toileting Toileting steps completed by helper: Adjust clothing prior to toileting, Performs perineal hygiene, Adjust clothing after toileting Toileting Assistive Devices: Other (comment)(stedy)  Toileting assist Assist level: Touching or steadying assistance (Pt.75%)   Transfers Chair/bed transfer   Chair/bed transfer method: Stand pivot Chair/bed transfer assist level: Moderate assist (Pt 50 - 74%/lift or lower) Chair/bed transfer assistive device: Armrests Mechanical lift: Stedy   Locomotion Ambulation     Max distance: 25' Assist level: Maximal assist (Pt 25 - 49%)   Wheelchair   Type: Manual Max wheelchair distance: 150' Assist Level: Supervision or verbal cues  Cognition Comprehension Comprehension assist level: Follows basic conversation/direction with extra time/assistive device  Expression Expression assist level: Expresses basic needs/ideas: With no assist  Social Interaction Social Interaction assist level: Interacts appropriately 90% of the time - Needs monitoring or encouragement for participation or interaction.  Problem Solving Problem solving assist level: Solves basic problems with no assist  Memory Memory assist level: More than reasonable amount of time    Medical Problem List and Plan:  1. 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 Aspirin and Plavix x3 weeks then aspirin alone off Plavix  6/20  Cont CIR  WHO/PRAFO ordered  Notes reviewed, images reviewed, labs reviewed 2. DVT Prophylaxis/Anticoagulation: Subcutaneous Lovenox  3. Pain Management: Tylenol as needed  4. Mood: Provide emotional support  5. Neuropsych: This patient is capable of making decisions on his own behalf.  6. Skin/Wound Care: Routine skin checks  7. Fluids/Electrolytes/Nutrition: Routine in and outs  8. CAD with CABG. Continue aspirin and Plavix. No chest pain or shortness of breath  9.  Diabetes mellitus. Hemoglobin A1c 6.0. SSI. Check blood sugars before meals and at bedtime   Home meds Glucophage 1000 mg twice daily, Amaryl 4 mg daily, Lantus insulin 10 units nightly, Tradjenta 5 mg daily  CBG (last 3)  Recent Labs    09/03/17 1643 09/03/17 2118 09/04/17 0651  GLUCAP 165* 129* 127*   Tradjenta 5mg , Metformin 500mg  BID, the lunchtime CBG was during meal  Slightly labile on 6/24 10. Dysphagia. Dysphasia #2 nectar liquids. Follow-up speech therapy  11. Hypertension. Cozaar 25 mg daily, Toprol 50 mg daily  Vitals:   09/03/17 2007 09/04/17 0515  BP: 119/66 124/64  Pulse: 79 80  Resp: 16 18  Temp: 98.3 F (36.8 C) 99.5 F (37.5 C)  SpO2: 98% 95%   Controlled 6/24 12. Diastolic congestive heart failure. Monitor for any signs of fluid overload. Patient was taking Lasix 40 mg daily if needed for fluid retention prior to admission.   No SOB, restarted low dose Lasix 10mg , increased to 20mg     Filed Weights   09/03/17 0420 09/04/17 0515 09/04/17 0600  Weight: 82.3 kg (181 lb 7 oz) 70.6 kg (155 lb 10.3 oz) 81.7 kg (180 lb 1.9 oz)   ?Reliability 13. History of seizure disorder. Depakote 500 mg daily.  14. Hyperlipidemia. Lipitor  15. History of alcohol use. Monitor for withdrawal  16.  Elevated Creat resolved, remains normal on Metformin  17.  Constipation-improved, reduced senna to 1po BID, continent of stool continue Bisacodyl supp prn 18. Right knee OA  X-ray is reviewed 04/2017 showing OA   Voltaren gel ordered    LOS (Days) 24 A FACE TO FACE EVALUATION WAS PERFORMED  Tyra Michelle Lorie Phenix 09/04/2017 11:20 AM

## 2017-09-04 NOTE — Progress Notes (Signed)
Pt c/o right knee pain. Voltaren gel and tylenol as ordered. Therapist states pt unable to complete ROM and bear weight d/t pain. PA aware.Claude Manges, LPN

## 2017-09-04 NOTE — Progress Notes (Signed)
Occupational Therapy Session Note  Patient Details  Name: Fred [name] MRN: [id number] Date of Birth: Nov 01, 1935  Today's Date: 09/04/2017  Session 1 OT Individual Time: 1100-1200 OT Individual Time Calculation (min): 60 min   Session 2 OT Individual Time: 1345-1415 OT Individual Time Calculation (min): 30 min   Short Term Goals: Week 3:  OT Short Term Goal 1 (Week 3): Pt will maintain standing balance with no more than Mod A while pulling pants up over hips OT Short Term Goal 2 (Week 3): Pt will complete shower transfer with mod A, OT Short Term Goal 3 (Week 3): Pt will demonstrate proficiency in self ROM to L UE  Skilled Therapeutic Interventions/Progress Updates:  Session 1   Pt greeted semi-reclined in bed with daughter and granddaughter present. Pt reports improved knee pain compared to earlier after ice. Pt tolerated OT assisting with advancing R and LLE to EOB. Pt sat EOB with close supervision and intermittent min A to maintain midline. Pt completed 10 partial sit<>stands in stedy with bed raised and max A for partial stand. Pt unable to fully extend R hip/knee 2/2 pain. Activity did however force weight bearing through LLE and L UE. When seated to rest, facilitated L UE NMR with horizontal abduction/adduction across [name] handle. Pt request to change clothes seated EOB and required total A for LB dressing today with stedy used for sit<>stand with +2 helped advance pants over hips. Pt then able to recall hemi-dressing techniques and complete 3/4 steps. Worked on scooting along EOB with mod A for head/hips relationship. Brought pt into sidelying in bed and educated family on L UE NMR in gravity eliminated sidelying position. Family demonstrated understanding. Pt left semi-reclined in bed with needs met and bed alarm on.   Session 2 Pt greeted returning from the bathroom via [name] with nursing staff. Per nursing, pt required max A +2 to stand in [place] 2/2 R knee pain. Assisted with  returning pt to supine for rest. Applied 1:1 NMES to CH1 supraspinatus and middle deltoid to help approximate shoulder joint, and Ch 2 wrist extensors.  Ratio 1:1 Rate 35 pps Waveform- Asymmetric Ramp 1.0 Pulse 300  CH1 Intensity- 15  Duration -  18  CH2  Intensity- 25 Duration -  15  Therapy Documentation Precautions:  Precautions Precautions: Fall Precaution Comments: L sided weakness Restrictions Weight Bearing Restrictions: No Pain: Pain Assessment Pain Scale: 0-10 Pain Score: 4  Pain Location: Knee Pain Orientation: Right Pain Descriptors / Indicators: Aching Pain Onset: On-going Pain Intervention(s): Repositioned ADL: ADL ADL Comments: see functional navigator   Other Treatments:    See Function Navigator for Current Functional Status.  Therapy/Group: Individual Therapy  Valma Cava 09/04/2017, 3:07 PM

## 2017-09-04 NOTE — Consult Note (Signed)
Neuropsychological Consultation   Patient:   Fred [name]   DOB:   12/11/1935  MR Number:  [id number]  Location:  [facility] [id number] [place] [address] Dept: [place]: [phone]           Date of Service:   09/04/2017  Start Time:   10 AM End Time:   11 AM  Provider/Observer:  Ilean Skill, Psy.D.       Clinical Neuropsychologist       Billing Code/Service: [phone] 4 Units  Chief Complaint:    Fred [name] is an 82 year old right-handed male who has a history of alcohol use, hypertension, seizures maintained on Depakote as well as a history of prior CVA.  The recent stroke occurred in January 2019 he did receive TPA at that time.  The patient had recovered mostly to baseline prior to the most recent stroke.  The patient also has CAD with CABG maintained on aspirin, transient atrial fibrillation and diastolic congestive heart failure.  The patient presented on 08/09/2017 with left-sided weakness and facial droop as well as slurred speech.  MRI showed subcentimeter acute versus early subacute infarction within the right posterior limb of the internal capsule.  The patient has had ongoing motor deficits/hemiparesis on the left side of his body.  Speech has continued to improve over course of CIR and is returning to baseline.  Patient with continued left sided motor deficits but is now able to have movement in both left hand and left leg.  Still limited.  Patient and family report end of spontaneous or sudden crying.     Reason for Service:  The patient was referred for follow-up neuropsychological consult due to further assess adjustment and coping with up coming discharged planned with return home.  Below is the HPI for the current admission.   HPI: Fred [name] is an 82 year old right-handed male with history of alcohol use, hypertension, seizure maintained on Depakote as  well as history of CVA with little residual January 2019 and did receive TPA. CAD with CABG maintained on aspirin, transient atrial fibrillation, diastolic congestive heart failure. Per chart review patient lives with spouse. Independent and active. Two-level home with bedroom on Main level and 2 steps to entry. Excellent local family support. Presented 08/09/2017 with left-sided weakness and facial droop as well as slurred speech. Cranial CT scan negative. CT angiogram of head and neck showed mild less than 50% proximal right ICA stenosis and moderate to severe 70% proximal left ICA stenosis. MRI showed subcentimeter acute versus early subacute infarction within the right posterior limb of internal capsule. Echocardiogram with ejection fraction of 84% grade 1 diastolic dysfunction. Currently maintained on aspirin and Plavix for CVA prophylaxis x3 weeks then aspirin alone. Subcutaneous Lovenox for DVT prophylaxis. Dysphasia #2 nectar thick liquid diet. Physical and occupational therapy evaluations completed with recommendations of physical medicine rehab consult. Patient was admitted for a comprehensive rehab program.  Current Status:  The patient continues to have improving but significant left sided motor deficits.  Speech has improved and slurring of words was not noted today to any significant degree.  No crying was noted today and family reports that sudden spontaneous crying had stopped.     Behavioral Observation: Fred [name]  presents as a 82 y.o.-year-old Right Caucasian Male who appeared his stated age. his dress was Appropriate and he was Well Groomed and his manners were Appropriate to the situation.  his participation was indicative of Appropriate behaviors.  There were any physical disabilities noted.  he displayed an appropriate level of cooperation and motivation.     Interactions:    Active Appropriate  Attention:   abnormal and attention span appeared shorter than expected for  age  Memory:   within normal limits; recent and remote memory intact  Visuo-spatial:  within normal limits  Speech (Volume):  low  Speech:   slurred; normal  Thought Process:  Coherent and Relevant  Though Content:  WNL; not suicidal and not homicidal  Orientation:   person, place, time/date and situation  Judgment:   Good  Planning:   Good  Affect:    Appropriate  Mood:    Euthymic  Insight:   Good  Medical History:   Past Medical History:  Diagnosis Date  . Atrial fibrillation ([place])    Post operative  . CAD (coronary artery disease)    s/p cabg  . CHF (congestive heart failure) ([place])   . Diverticulosis   . DJD (degenerative joint disease)   . DM (diabetes mellitus) ([place])   . History of anemia   . History of stroke   . Hyperlipidemia   . Hypertension   . 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 Peripheral vascular disease ([place])   . Polyneuropathy, diabetic ([place])   . Seizure disorder, complex partial ([place])   . Syncope and collapse    Psychiatric History:  No prior history of depression or anxiety disorders or other significant psychiatric illness.  Family Med/Psych History:  Family History  Problem Relation Age of Onset  . Epilepsy Father   . Stroke Mother   . Lung cancer Unknown     Risk of Suicide/Violence: low the patient denies any suicidal or homicidal ideation.  Impression/DX:  Fred [name] is an 82 year old right-handed male who has a history of alcohol use, hypertension, seizures maintained on Depakote as well as a history of prior CVA.  The recent stroke occurred in January 2019 he did receive TPA at that time.  The patient had recovered mostly to baseline prior to the most recent stroke.  The patient also has CAD with CABG maintained on aspirin, transient atrial fibrillation and diastolic congestive heart failure.  The patient presented on 08/09/2017 with left-sided weakness and facial droop as well as slurred speech.  MRI showed  subcentimeter acute versus early subacute infarction within the right posterior limb of the internal capsule.  The patient has had ongoing motor deficits/hemiparesis on the left side of his body.  Speech has continued to improve over course of CIR and is returning to baseline.  Patient with continued left sided motor deficits but is now able to have movement in both left hand and left leg.  Still limited.  Patient and family report end of spontaneous or sudden crying.   The patient continues to have significant left-sided motor deficits affecting both the left arm and left leg.  The patient also has some motor deficits on the left side of his face affecting affected pronunciation and speech.  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  This would likely be due to lesioning in the white matter tracts connecting frontal lobe and medulla brain regions.  The patient continues to have improving but significant left sided motor deficits.  Speech has improved and slurring of words was not noted today to any  significant degree.  No crying was noted today and family reports that sudden spontaneous crying had stopped.            Electronically Signed   _______________________ Ilean Skill, Psy.D.

## 2017-09-05 ENCOUNTER — Inpatient Hospital Stay (HOSPITAL_COMMUNITY): Payer: Medicare Other

## 2017-09-05 ENCOUNTER — Inpatient Hospital Stay (HOSPITAL_COMMUNITY): Payer: Medicare Other | Admitting: Physical Therapy

## 2017-09-05 ENCOUNTER — Inpatient Hospital Stay (HOSPITAL_COMMUNITY): Payer: Medicare Other | Admitting: Occupational Therapy

## 2017-09-05 DIAGNOSIS — M10061 Idiopathic gout, right knee: Secondary | ICD-10-CM

## 2017-09-05 LAB — GLUCOSE, CAPILLARY
GLUCOSE-CAPILLARY: 123 mg/dL — AB (ref 70–99)
GLUCOSE-CAPILLARY: 145 mg/dL — AB (ref 70–99)
GLUCOSE-CAPILLARY: 122 mg/dL — AB (ref 70–99)
Glucose-Capillary: 160 mg/dL — ABNORMAL HIGH (ref 70–99)

## 2017-09-05 MED ORDER — COLCHICINE 0.6 MG PO TABS
0.6000 mg | ORAL_TABLET | Freq: Two times a day (BID) | ORAL | Status: AC
  Administered 2017-09-05 – 2017-09-07 (×6): 0.6 mg via ORAL
  Filled 2017-09-05 (×7): qty 1

## 2017-09-05 MED ORDER — ALLOPURINOL 100 MG PO TABS
300.0000 mg | ORAL_TABLET | Freq: Every day | ORAL | Status: DC
  Administered 2017-09-05 – 2017-09-13 (×9): 300 mg via ORAL
  Filled 2017-09-05 (×9): qty 3

## 2017-09-05 NOTE — Progress Notes (Signed)
Physical Therapy Session Note  Patient Details  Name: Fred [name] MRN: [id number] Date of Birth: May 15, 1935  Today's Date: 09/05/2017 PT Individual Time: 1415-1510 PT Individual Time Calculation (min): 55 min   Short Term Goals: Week 3:  PT Short Term Goal 1 (Week 3): =LTGs due to ELOS  Skilled Therapeutic Interventions/Progress Updates:    Pt seated in w/c upon PT arrival, agreeable to therapy tx and reports R knee pain 4/10 this session. Pt's family present for education/training. Pt transported from room>gym in w/c. Pt performed sit<>stand at the rail with max assist from w/c height,verbal cues for techniques and manual facilitation for upright posture. Pt with increased L lateral lean and pushing, requiring mod-max assist for balance. Therapist educating family on current set backs and decline in function since R knee gout flare up. Pt, family and therapist discussing d/c planning and the need to possibly stay longer for pt to get back to min assist level. Pt performed stand pivot transfer from w/c>mat with max assist using hemiwalker, verbal cues for techniques. Pt performed sit<>stands from elevated mat height (21 inches) with mod assist using hemiwalker on R side for UE support, pt with improved ability to stand from elevated mat. Therapist educating family on modifications for home including measuring seat heights, therapist explained that a higher surface with bed easier to stand up from. Therapist also recommended lowering bed height as it would be too high for the pt to transfer onto. Pt worked on static standing balance with use of hemiwalker for R UE support, min assist. Pt performed transfer from mat>w/c towards the R with assist from granddaughter, therapist providing education on techniques, guarding and body mechanics. Pt transported back to room and left in care of family with ice applied to R knee for pain relief.   Therapy Documentation Precautions:  Precautions Precautions:  Fall Precaution Comments: L sided weakness Restrictions Weight Bearing Restrictions: No   See Function Navigator for Current Functional Status.   Therapy/Group: Individual Therapy  Netta Corrigan, PT, DPT 09/05/2017, 12:51 PM

## 2017-09-05 NOTE — Progress Notes (Signed)
Physical Therapy Session Note  Patient Details  Name: Fred [name] MRN: [id number] Date of Birth: Sep 21, 1935  Today's Date: 09/05/2017 PT Individual Time: 1000-1055 AND 1300-1330 PT Individual Time Calculation (min): 55 min AND 30 min  Short Term Goals: Week 3:  PT Short Term Goal 1 (Week 3): =LTGs due to ELOS  Skilled Therapeutic Interventions/Progress Updates:   Session 1:  Pt in w/c and agreeable to therapy, reports R knee pain 2/10 at rest. Practiced multiple slide board transfers w/ longer board to/from mat and to/from bed, both level transfers. Min assist overall and pt's daughter, Butch Penny, also performed 1 min assist transfer. Pt able to boost through RLE w/ only mild increase in pain and demonstrates improved carryover of technique. Continued education on using slideboard when pt's gout is flared up at home preventing safe stand pivot transfers. Performed 1 stand pivot transfer to NuStep w/ mod-max assist 2/2 increase in pain in WB, however pain only increased to 5/10. Performed NuStep within pain tolerable R knee ROM to work on LE strengthening and reciprocal movement pattern that was non-WB. Performed NuStep 10 min @ level 1 w/ min verbal and tactile cues for neutral LLE alignment. Returned to room and ended session in w/c and in care of family, all needs met. Ice pack applied to R knee for pain relief/management.   Session 2:  Pt in w/c and agreeable to therapy, pain remains 2/10 in R knee. Session focused on practicing car transfer w/ family observing and performing some hands-on training. Transferred w/c to/from car w/ mod-max assist x2 reps, x1 slide baord and x1 stand pivot. Pt's granddaughter providing assist w/ trunk for safety and to get feel of how pt moves during transfer. Pt limited in functional mobility this date 2/2 R knee pain, expect pain to continue to subside prior to d/c and will re-attempt car transfer w/ family more hands-on. Continued education on slide board vs  stand pivot based on pt's R knee pain. Returned to room and ended session in w/c and in care of family, all needs met.   Therapy Documentation Precautions:  Precautions Precautions: Fall Precaution Comments: L sided weakness Restrictions Weight Bearing Restrictions: No  See Function Navigator for Current Functional Status.   Therapy/Group: Individual Therapy  Noya Santarelli K Arnette 09/05/2017, 11:52 AM

## 2017-09-05 NOTE — Progress Notes (Signed)
Uric acid returned elevated 8.8.  Patient on Zyloprim prior to admission and will resume.  We will also add colchicine for now for suspect acute gout flareup to right knee.

## 2017-09-05 NOTE — Plan of Care (Signed)
  Problem: Consults Goal: RH STROKE PATIENT EDUCATION Description See Patient Education module for education specifics  Outcome: Progressing   Problem: RH BOWEL ELIMINATION Goal: RH STG MANAGE BOWEL W/MEDICATION W/ASSISTANCE Description STG Manage Bowel with Medication with  Mod I Assistance.  Outcome: Progressing   Problem: RH BLADDER ELIMINATION Goal: RH STG MANAGE BLADDER WITH ASSISTANCE Description STG Manage Bladder With  Min Assistance  Outcome: Progressing   Problem: RH SAFETY Goal: RH STG ADHERE TO SAFETY PRECAUTIONS W/ASSISTANCE/DEVICE Description STG Adhere to Safety Precautions With  Mod cues/ supervision Assistance/Device.   Outcome: Progressing   Problem: RH KNOWLEDGE DEFICIT Goal: RH STG INCREASE KNOWLEDGE OF DIABETES Description Pt will be able to explain how to monitor and manage diabetes/diet/medications using notes/resources independently  Outcome: Progressing Goal: RH STG INCREASE KNOWLEDGE OF HYPERTENSION Description Pt will be able to demo understanding of HH/CMM diet and medications to manage HTN and state what to do to manage HTN and when to take medications  Outcome: Progressing

## 2017-09-05 NOTE — Progress Notes (Signed)
Occupational Therapy Weekly Progress Note  Patient Details  Name: Fred [name] MRN: [id number] Date of Birth: 1935/11/06  Beginning of progress report period: August 12, 2017 End of progress report period: September 05, 2017  Today's Date: 09/05/2017 OT Individual Time: 0805-0900 OT Individual Time Calculation (min): 55 min   Patient has met 1 of 3 short term goals.  Pt initially making slow but steady progress with OT, however the past few days, progress had slowed secondary to medical issues from gout flare up in R knee. Trace R UE scap/shoulder present, with some finger flex within flexor synergy pattern. He still needs max A for LB dressing, but min A UB dressing. Transfers have regressed to a max/total A stand-pivot 2/2 difficulty weight bearing through painful R knee. Hopeful pt's gout flare up will improve quickly and pt will continue to progress again. Will likely need to extend pt's LOS for these reasons.    Patient continues to demonstrate the following deficits: muscle weakness and muscle joint tightness, abnormal tone, unbalanced muscle activation, decreased coordination and decreased motor planning and decreased sitting balance, decreased standing balance, decreased postural control, hemiplegia and decreased balance strategies and therefore will continue to benefit from skilled OT intervention to enhance overall performance with BADL and Reduce care partner burden.  Patient progressing toward long term goals..  Continue plan of care.  OT Short Term Goals Week 3:  OT Short Term Goal 1 (Week 3): Pt will maintain standing balance with no more than Mod A while pulling pants up over hips OT Short Term Goal 1 - Progress (Week 3): Progressing toward goal OT Short Term Goal 2 (Week 3): Pt will complete shower transfer with mod A, OT Short Term Goal 2 - Progress (Week 3): Progressing toward goal OT Short Term Goal 3 (Week 3): Pt will demonstrate proficiency in self ROM to L UE OT Short Term  Goal 3 - Progress (Week 3): Met Week 4:  OT Short Term Goal 1 (Week 4): STG=LTG 2/2 ELOS  Skilled Therapeutic Interventions/Progress Updates:    Pt greeted semi-reclined in bed with daughter and grand-daughter present. Pt reports some improved knee pain today, but still has difficulty weight bearing. Pt came to sitting EOB and worked on donning shoes and AFO. Educated pt's family on how to assist with AFO and what to look for with positioning. Pt able to don R shoe with reacher and shoe horn, but needed assist with L and AFO. Attempted stand-pivot or squat-pivot to wc on R side, but pt unable to power up on painful R knee. Utilized slideboard to transfer to wc on R side with pt needing mod/max A with bobath method across even surface. Pt declined bathing/dressing today. Pt brought to dayroom and utilized standing frame for 3 sit<>stands with max A to lift into standing. OT then facilitated weight bearing through L UE and facilitated hip extension. Pt had difficulty fully extending R knee 2/2 pain, but tolerated standing for 3-4 minute bouts x3 sets. Pt returned to room at end of session and left seated in wc with safety belt on and family present.   Therapy Documentation Precautions:  Precautions Precautions: Fall Precaution Comments: L sided weakness Restrictions Weight Bearing Restrictions: No Pain: Pain Assessment Pain Scale: 0-10 Pain Score: 2  Pain Type: Acute pain Pain Location: Knee Pain Orientation: Right Pain Descriptors / Indicators: Aching Pain Onset: With Activity Pain Intervention(s): Repositioned ADL: ADL ADL Comments: see functional navigator  See Function Navigator for Current Functional Status.  Therapy/Group: Individual Therapy  Valma Cava 09/05/2017, 12:37 PM

## 2017-09-05 NOTE — Progress Notes (Signed)
[facility] PHYSICAL MEDICINE & REHABILITATION     PROGRESS NOTE  Subjective/Complaints:  Patient seen lying in bed this morning. Family at bedside. He notes that his right knee feels better. Also discussed with daughter gout treatment.  ROS: denies CP, SOB, nausea, vomiting, diarrhea  Objective: Vital Signs: Blood pressure 127/62, pulse 76, temperature 98.3 F (36.8 C), temperature source Oral, resp. rate 18, height 5\' 3"  (1.6 m), weight 78.9 kg (174 lb), SpO2 97 %. No results found. No results for input(s): WBC, HGB, HCT, PLT in the last 72 hours. No results for input(s): NA, K, CL, GLUCOSE, BUN, CREATININE, CALCIUM in the last 72 hours.  Invalid input(s): CO CBG (last 3)  Recent Labs    09/04/17 1653 09/04/17 2127 09/05/17 0626  GLUCAP 182* 128* 123*    Wt Readings from Last 3 Encounters:  09/05/17 78.9 kg (174 lb)  08/10/17 84.4 kg (186 lb)  07/18/17 87.1 kg (192 lb)    Physical Exam:  BP 127/62 (BP Location: Right Arm)   Pulse 76   Temp 98.3 F (36.8 C) (Oral)   Resp 18   Ht 5\' 3"  (1.6 m)   Wt 78.9 kg (174 lb)   SpO2 97%   BMI 30.82 kg/m  Constitutional: No distress . Vital signs reviewed. HENT: Normocephalic.  Atraumatic. Eyes: EOMI. No discharge. Cardiovascular: RRR. No JVD. Respiratory: CTA Bilaterally. Normal effort. GI: BS +. Non-distended. Musc: Right knee with edema and tenderness, improving Neurological: He is alert and oriented Follows full commands.  Fair awareness of deficits.  Motor: LUE: shoulder abduction, elbow flexion/extension 1/5, hand grip 2+/5 LLE: hip flexion 2+/5, distally trace (stable) Skin: Skin is warm and dry.  Psychiatric: He has a normal mood and affect. His behavior is normal.   Assessment/Plan: 1. 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 Physiatrist is providing close team  supervision and 24 hour management of active medical problems listed below. Physiatrist and rehab team continue to assess barriers to discharge/monitor patient progress toward functional and medical goals.  Function:  Bathing Bathing position   Position: Wheelchair/chair at sink  Bathing parts Body parts bathed by patient: Chest, Abdomen, Right upper leg, Left upper leg, Front perineal area Body parts bathed by helper: Right lower leg, Left lower leg, Back, Right arm, Left arm  Bathing assist Assist Level: Touching or steadying assistance(Pt > 75%)      Upper Body Dressing/Undressing Upper body dressing   What is the patient wearing?: Pull over shirt/dress     Pull over shirt/dress - Perfomed by patient: Thread/unthread left sleeve, Thread/unthread right sleeve, Put head through opening, Pull shirt over trunk Pull over shirt/dress - Perfomed by helper: Pull shirt over trunk        Upper body assist Assist Level: Supervision or verbal cues      Lower Body Dressing/Undressing Lower body dressing   What is the patient wearing?: Pants, Socks, Shoes   Underwear - Performed by helper: Thread/unthread right underwear leg, Thread/unthread left underwear leg, Pull underwear up/down Pants- Performed by patient: Thread/unthread right pants leg Pants- Performed by helper: Thread/unthread left pants leg, Pull pants up/down, Thread/unthread right pants leg   Non-skid slipper socks- Performed by helper: Don/doff right sock, Don/doff left sock   Socks - Performed by helper: Don/doff right sock, Don/doff left sock   Shoes - Performed by helper: Don/doff right shoe, Don/doff left shoe, Fasten  right, Fasten left          Lower body assist Assist for lower body dressing: Touching or steadying assistance (Pt > 75%)      Toileting Toileting Toileting activity did not occur: No continent bowel/bladder event Toileting steps completed by patient: Adjust clothing prior to toileting, Adjust  clothing after toileting Toileting steps completed by helper: Adjust clothing prior to toileting, Performs perineal hygiene, Adjust clothing after toileting Toileting Assistive Devices: Grab bar or rail  Toileting assist Assist level: Two helpers   Transfers Chair/bed transfer   Chair/bed transfer [place]: Stand pivot Chair/bed transfer assist level: Moderate assist (Pt 50 - 74%/lift or lower) Chair/bed transfer assistive device: Armrests Mechanical lift: Stedy   Locomotion Ambulation     Max distance: 25' Assist level: Maximal assist (Pt 25 - 49%)   Wheelchair   Type: Manual Max wheelchair distance: 150' Assist Level: Supervision or verbal cues  Cognition Comprehension Comprehension assist level: Follows basic conversation/direction with extra time/assistive device  Expression Expression assist level: Expresses basic needs/ideas: With no assist  Social Interaction Social Interaction assist level: Interacts appropriately 90% of the time - Needs monitoring or encouragement for participation or interaction.  Problem Solving Problem solving assist level: Solves basic problems with no assist  Memory Memory assist level: More than reasonable amount of time    Medical Problem List and Plan:  1. 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 Aspirin and Plavix x3 weeks then aspirin alone off Plavix  6/20  Cont CIR  WHO/PRAFO  2. DVT Prophylaxis/Anticoagulation: Subcutaneous Lovenox  3. Pain Management: Tylenol as needed  4. Mood: Provide emotional support  5. Neuropsych: This patient is capable of making decisions on his own behalf.  6. Skin/Wound Care: Routine skin checks  7. Fluids/Electrolytes/Nutrition: Routine in and outs  8. CAD with CABG. Continue aspirin and Plavix. No chest pain or shortness of breath  9. Diabetes mellitus. Hemoglobin A1c 6.0. SSI. Check blood sugars before meals and at bedtime    Home meds Glucophage 1000 mg twice daily, Amaryl 4 mg daily, Lantus insulin 10 units nightly, Tradjenta 5 mg daily  CBG (last 3)  Recent Labs    09/04/17 1653 09/04/17 2127 09/05/17 0626  GLUCAP 182* 128* 123*   Tradjenta 5mg , Metformin 500mg  BID  Slightly labile on 6/25 10. Dysphagia. Dysphasia #2 nectar liquids. Follow-up speech therapy  11. Hypertension. Cozaar 25 mg daily, Toprol 50 mg daily  Vitals:   09/04/17 2011 09/05/17 0630  BP: 128/62 127/62  Pulse: 80 76  Resp: 18 18  Temp: 98.2 F (36.8 C) 98.3 F (36.8 C)  SpO2: 97% 97%   Controlled 6/25 12. Diastolic congestive heart failure. Monitor for any signs of fluid overload. Patient was taking Lasix 40 mg daily if needed for fluid retention prior to admission.   No SOB, restarted low dose Lasix 10mg , increased to 20mg     Filed Weights   09/04/17 0515 09/04/17 0600 09/05/17 0630  Weight: 70.6 kg (155 lb 10.3 oz) 81.7 kg (180 lb 1.9 oz) 78.9 kg (174 lb)   ?Reliability 13. History of seizure disorder. Depakote 500 mg daily.  14. Hyperlipidemia. Lipitor  15. History of alcohol use. Monitor for withdrawal  16.  Elevated Creat resolved, remains normal on Metformin 17.  Constipation-improved, reduced senna to 1po BID, continent of stool continue Bisacodyl supp prn 18. Knee OA pain  Multifactorial - OA + gout  X-ray is reviewed 04/2017 showing OA  History of gout with elevated uric acid level.  Colchicine started on 6/25  Voltaren gel ordered    LOS (Days) 25 A FACE TO FACE EVALUATION WAS PERFORMED  Donnesha Karg Lorie Phenix 09/05/2017 9:07 AM

## 2017-09-06 ENCOUNTER — Inpatient Hospital Stay (HOSPITAL_COMMUNITY): Payer: Medicare Other

## 2017-09-06 ENCOUNTER — Inpatient Hospital Stay (HOSPITAL_COMMUNITY): Payer: Medicare Other | Admitting: Occupational Therapy

## 2017-09-06 ENCOUNTER — Inpatient Hospital Stay (HOSPITAL_COMMUNITY): Payer: Medicare Other | Admitting: Physical Therapy

## 2017-09-06 ENCOUNTER — Encounter (HOSPITAL_COMMUNITY): Payer: Self-pay | Admitting: *Deleted

## 2017-09-06 DIAGNOSIS — I5032 Chronic diastolic (congestive) heart failure: Secondary | ICD-10-CM

## 2017-09-06 LAB — GLUCOSE, CAPILLARY
GLUCOSE-CAPILLARY: 133 mg/dL — AB (ref 70–99)
GLUCOSE-CAPILLARY: 185 mg/dL — AB (ref 70–99)
Glucose-Capillary: 134 mg/dL — ABNORMAL HIGH (ref 70–99)
Glucose-Capillary: 123 mg/dL — ABNORMAL HIGH (ref 70–99)

## 2017-09-06 NOTE — Progress Notes (Signed)
Social Work Patient ID: Fred [name], male   DOB: [birth year], 82 y.o.   MRN: [id number]  Met with pt, wife and daughter to discuss team conference progress toward his goals and the new target discharge 7/3. All are in agreement that he needs to stay the few extra days due to his gout flare up. Family is participating in his care and learning. Will order equipment and follow up closer to discharge date.

## 2017-09-06 NOTE — Progress Notes (Signed)
Physical Therapy Session Note  Patient Details  Name: Fred [name] MRN: [id number] Date of Birth: [birth year]  Today's Date: 09/06/2017 PT Individual Time: 1300-1330 PT Individual Time Calculation (min): 30 min   Short Term Goals: Week 3:  PT Short Term Goal 1 (Week 3): =LTGs due to ELOS  Skilled Therapeutic Interventions/Progress Updates:   Pt received sitting in WC and agreeable to PT. PT instructed pt in WC mobility x 126f with supervision assist with min cues for improved control in turns through door and around corner. Sit<>stand in parallel bars with min-mod assist from PT to stabilize the LUE on parallel bars and block the L knee standing balance with BUE support with min-supervision assist while performed lateral weight shifting R and L with verbal cues to weight shift beyond midline. Gait training in parallel bars 2 ft forward/backward +542fforward /backward and mod assist to stabilize the LUE and progress to the LLE to take step through with L and stabilize the LLE. [facility] returned to [facility] and all needs met.         Therapy Documentation Precautions:  Precautions Precautions: Fall Precaution Comments: L sided weakness Restrictions Weight Bearing Restrictions: No   Pain:   denies   See Function Navigator for Current Functional Status.   Therapy/Group: Individual Therapy  AuLorie Phenix/26/2019, 1:33 PM

## 2017-09-06 NOTE — Progress Notes (Addendum)
Occupational Therapy Session Note  Patient Details  Name: Fred [name] MRN: [id number] Date of Birth: [birth year]  Today's Date: 09/06/2017  Session 1 OT Individual Time: 1003-1100 OT Individual Time Calculation (min): 57 min   Session 2 OT Individual Time: 3016-0109 OT Individual Time Calculation (min): 30 min   Short Term Goals: Week 4:  OT Short Term Goal 1 (Week 4): STG=LTG 2/2 ELOS  Skilled Therapeutic Interventions/Progress Updates:  Session 1   Pt greeted seated in wc with daughter present. Utilized stedy to transfer pt into shower 2/2 continued R knee pain. Pt was able to stand easier today with min/mod A with and without Stedy. Bathing completed with min/set-up A with use of LH sponge to wash lower body. Leaning method used to wash buttocks with OT assist. RUE NMR with hand over hand A while bathing. UB and LB dressing completed wc at the sink. Pt able to actively extend L knee today in preparation to thread pant leg using reacher! Sit<>stand w/o Stedy with max A to lift and lower. Incorporated R grasp within grooming tasks and pt able to activate grip around deodorant today. Pt left seated in wc at end of session with daughter present and needs met.   Session 2 Pt greeted sitting in wc and agreeable to OT treatment session focused on L UE NMR and NEMS. Applied 1:1 NMES to CH1 supraspinatus and middle deltoid to help approximate shoulder joint, and Ch 2 wrist extensors.  Ratio 1:1 Rate 35 pps Waveform- Asymmetric Ramp 1.0 Pulse 300 CH1 Intensity- 20  Duration -  15  CH2 Intensity- 25 Duration -  15  Pt denies pain. Facilitated elbow flex/ext and shoulder flex/ext with OT providing joint input to bring pt through full ROM. Pt returned to room and left seated in wc with chair alarm on and needs met.    Therapy Documentation Precautions:  Precautions Precautions: Fall Precaution Comments: L sided weakness Restrictions Weight Bearing Restrictions: No Pain:  none/denies pain ADL: ADL ADL Comments: see functional navigator See Function Navigator for Current Functional Status.   Therapy/Group: Individual Therapy  Valma Cava 09/06/2017, 3:45 PM

## 2017-09-06 NOTE — Patient Care Conference (Signed)
Inpatient RehabilitationTeam Conference and Plan of Care Update Date: 09/06/2017   Time: 11:00 AM    Patient Name: Fred [name]      Medical Record Number: [id number]  Date of Birth: 1935-11-09 Sex: Male         Room/Bed: 4W14C/4W14C-01 Payor Info: Payor: MEDICARE / Plan: MEDICARE PART A AND B / Product Type: *No Product type* /    Admitting Diagnosis: CVA  Admit Date/Time:  08/11/2017  5:19 PM Admission Comments: No comment available   Primary Diagnosis:  <principal problem not specified> Principal Problem: <principal problem not specified>  Patient Active Problem List   Diagnosis Date Noted  . Chronic diastolic (congestive) heart failure ([place])   . Acute idiopathic gout of right knee   . Primary osteoarthritis of right knee   . Spastic hemiparesis ([place])   . Diabetes mellitus type 2 in obese ([place])   . Small vessel disease ([place]) 08/11/2017  . Left hemiparesis ([place])   . Benign essential hypertension with delivery   . CVA (cerebral vascular accident) ([place]) 08/10/2017  . 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 of localized onset, not intractable, without status epilepticus ([place]) 06/20/2017  . Primary osteoarthritis of both knees 05/11/2017  . Stroke (cerebrum) ([place]) 03/21/2017  . Type 2 diabetes mellitus with complication, with long-term current use of insulin ([place]) 03/27/2015  . Atherosclerosis of native coronary artery of native heart without angina pectoris 03/27/2015  . Palpitations 11/04/2014  . S/P CABG (coronary artery bypass graft) 11/04/2014  . Atrial fibrillation, unspecified   . History of stroke 09/04/2014  . Obesity (BMI 30-39.9) 11/29/2013  . History of gout 05/16/2013  . Congestive heart failure ([place]) 04/20/2009  . DYSPNEA 02/11/2009  . CARDIOVASCULAR FUNCTION STUDY, ABNORMAL 01/29/2009  . NECK PAIN 08/03/2007  . ANEMIA / OTHER 05/16/2007  . Coronary atherosclerosis 05/16/2007  . PAROXYSMAL ATRIAL FIBRILLATION 05/16/2007   . 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 Hyperlipidemia 03/30/2007  . DEGENERATIVE JOINT DISEASE 03/30/2007  . PAROTID LESION, UNSPECIFIED 09/29/2006  . Partial epilepsy with impairment of consciousness ([place]) 09/29/2006  . Diabetic polyneuropathy ([place]) 09/29/2006  . Essential hypertension 09/29/2006  . PERIPHERAL VASCULAR DISEASE 09/29/2006  . DIVERTICULOSIS 09/29/2006    Expected Discharge Date: Expected Discharge Date: 09/13/17  Team Members Present: Physician leading conference: Dr. 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 Aspirin and Plavix x3 weeks then aspirin alone off Plavix  6/20  Improve mobility, BP, gout  See above   Bowel/Bladder   patient continent of B & b; LBM 09/04/17  maintain B&b WIN MIN ASSIST  continue to remain continent of B&B and use urinal   Swallow/Nutrition/ Hydration             ADL's   Limited by R knee pain, max A partial stand, Max A squat-pivot, Max A LB dressing, min A UB  Min A  L NMR, NMES, pt/family education, dc planning, modified bathing/dressing,    Mobility   min-mod assist transfers and gait w/ hemiwalker  min assist, gait short distance  family education and d/c planning, L NMR, managing gout flare up in R knee   Communication  Safety/Cognition/ Behavioral Observations            Pain   c/o of rt knee pain  pain <2 or 3  Monitor pain q shift and provide prn pain medications and cremes as ordered   Skin   no skin breakdown  remain free of skin breakdown  monitor shift q shift      *See Care Plan and progress notes for long and  short-term goals.     Barriers to Discharge  Current Status/Progress Possible Resolutions Date Resolved   Physician    Medical stability     See above  Therapies, optimize BP meds, follow weights, gout meds      Nursing                  PT  Inaccessible home environment;Home environment access/layout(gout flare up in R knee)  2 steps to enter home  family building ramp this week, gout flare up limiting mobility but teaching caregivers slide board transfers for at home too           OT                  SLP                SW                Discharge Planning/Teaching Needs:  Family is here daily and attend therapies with pt, on-going family education. Have lost some days due to gout flare and knee isues as a result.      Team Discussion:  Gout flare caused his to lose three days in therapies with his knee issues. Family here going through on-going education and learning her care. MD adjusting BP meds and CHF is stable watching labs. Getting back to min assist level in therapies. Ramp to be built this week.  Revisions to Treatment Plan:  DC 7/3 extended    Continued Need for Acute Rehabilitation Level of Care: The patient requires daily medical management by a physician with specialized training in physical medicine and rehabilitation for the following conditions: Daily direction of a multidisciplinary physical rehabilitation program to ensure safe treatment while eliciting the highest outcome that is of practical value to the patient.: Yes Daily medical management of patient stability for increased activity during participation in an intensive rehabilitation regime.: Yes Daily analysis of laboratory values and/or radiology reports with any subsequent need for medication adjustment of medical intervention for : Other;Neurological problems;Blood pressure problems;Cardiac problems  Avril Busser, Gardiner Rhyme 09/06/2017, 1:59 PM

## 2017-09-06 NOTE — Plan of Care (Signed)
  Problem: Consults Goal: RH STROKE PATIENT EDUCATION Description See Patient Education module for education specifics  Outcome: Progressing   Problem: RH BOWEL ELIMINATION Goal: RH STG MANAGE BOWEL W/MEDICATION W/ASSISTANCE Description STG Manage Bowel with Medication with  Mod I Assistance.  Outcome: Progressing   Problem: RH BLADDER ELIMINATION Goal: RH STG MANAGE BLADDER WITH ASSISTANCE Description STG Manage Bladder With  Min Assistance  Outcome: Progressing   Problem: RH SAFETY Goal: RH STG ADHERE TO SAFETY PRECAUTIONS W/ASSISTANCE/DEVICE Description STG Adhere to Safety Precautions With  Mod cues/ supervision Assistance/Device.   Outcome: Progressing   Problem: RH KNOWLEDGE DEFICIT Goal: RH STG INCREASE KNOWLEDGE OF DIABETES Description Pt will be able to explain how to monitor and manage diabetes/diet/medications using notes/resources independently  Outcome: Progressing Goal: RH STG INCREASE KNOWLEDGE OF HYPERTENSION Description Pt will be able to demo understanding of HH/CMM diet and medications to manage HTN and state what to do to manage HTN and when to take medications  Outcome: Progressing

## 2017-09-06 NOTE — Progress Notes (Signed)
Social Work   Manvi Guilliams, Eliezer Champagne  Social Worker  Physical Medicine and Rehabilitation  Patient Care Conference  Signed  Date of Service:  09/06/2017  1:58 PM          Signed          Show:Clear all [x] Manual[x] Template[] Copied  Added by: [x] Rosette Bellavance, Gardiner Rhyme, LCSW   [] Hover for details   Inpatient RehabilitationTeam Conference and Plan of Care Update Date: 09/06/2017   Time: 11:00 AM      Patient Name: Fred [name]      Medical Record Number: [id number]  Date of Birth: 01/15/1936 Sex: Male         Room/Bed: 4W14C/4W14C-01 Payor Info: Payor: MEDICARE / Plan: MEDICARE PART A AND B / Product Type: *No Product type* /     Admitting Diagnosis: CVA  Admit Date/Time:  08/11/2017  5:19 PM Admission Comments: No comment available    Primary Diagnosis:  <principal problem not specified> Principal Problem: <principal problem not specified>       Patient Active Problem List    Diagnosis Date Noted  . Chronic diastolic (congestive) heart failure ([place])    . Acute idiopathic gout of right knee    . Primary osteoarthritis of right knee    . Spastic hemiparesis ([place])    . Diabetes mellitus type 2 in obese ([place])    . Small vessel disease ([place]) 08/11/2017  . Left hemiparesis ([place])    . Benign essential hypertension with delivery    . CVA (cerebral vascular accident) ([place]) 08/10/2017  . 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 Primary osteoarthritis of both knees 05/11/2017  . Stroke (cerebrum) ([place]) 03/21/2017  . Type 2 diabetes mellitus with complication, with long-term current use of insulin ([place]) 03/27/2015  . Atherosclerosis of native coronary artery of native heart without angina pectoris 03/27/2015  . Palpitations 11/04/2014  . S/P CABG (coronary artery bypass graft) 11/04/2014  . Atrial fibrillation, unspecified    . History of stroke  09/04/2014  . Obesity (BMI 30-39.9) 11/29/2013  . History of gout 05/16/2013  . Congestive heart failure ([place]) 04/20/2009  . DYSPNEA 02/11/2009  . CARDIOVASCULAR FUNCTION STUDY, ABNORMAL 01/29/2009  . NECK PAIN 08/03/2007  . ANEMIA / OTHER 05/16/2007  . Coronary atherosclerosis 05/16/2007  . PAROXYSMAL ATRIAL FIBRILLATION 05/16/2007  . Subcortical infarction ([place]) 05/16/2007  . Hyperlipidemia 03/30/2007  . DEGENERATIVE JOINT DISEASE 03/30/2007  . PAROTID LESION, UNSPECIFIED 09/29/2006  . Partial epilepsy with impairment of consciousness ([place]) 09/29/2006  . Diabetic polyneuropathy ([place]) 09/29/2006  . Essential hypertension 09/29/2006  . PERIPHERAL VASCULAR DISEASE 09/29/2006  . DIVERTICULOSIS 09/29/2006      Expected Discharge Date: Expected Discharge Date: 09/13/17   Team Members Present: Physician leading conference: Dr. 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 Aspirin and Plavix x3 weeks then aspirin alone off Plavix  6/20  Improve mobility, BP, gout  See above   Bowel/Bladder     patient continent of B & b; LBM 09/04/17  maintain B&b WIN MIN ASSIST  continue to remain continent  of B&B and use urinal   Swallow/Nutrition/ Hydration               ADL's     Limited by R knee pain, max A partial stand, Max A squat-pivot, Max A LB dressing, min A UB  Min A  L NMR, NMES, pt/family education, dc planning, modified bathing/dressing,    Mobility     min-mod assist transfers and gait w/ hemiwalker  min assist, gait short distance  family education and d/c planning, L NMR, managing gout flare up  in R knee   Communication               Safety/Cognition/ Behavioral Observations             Pain     c/o of rt knee pain  pain <2 or 3  Monitor pain q shift and provide prn pain medications and cremes as ordered   Skin     no skin breakdown  remain free of skin breakdown  monitor shift q shift     *See Care Plan and progress notes for long and short-term goals.      Barriers to Discharge   Current Status/Progress Possible Resolutions Date Resolved   Physician     Medical stability     See above  Therapies, optimize BP meds, follow weights, gout meds      Nursing                 PT  Inaccessible home environment;Home environment access/layout(gout flare up in R knee)  2 steps to enter home  family building ramp this week, gout flare up limiting mobility but teaching caregivers slide board transfers for at home too           OT                 SLP            SW              Discharge Planning/Teaching Needs:  Family is here daily and attend therapies with pt, on-going family education. Have lost some days due to gout flare and knee isues as a result.      Team Discussion:  Gout flare caused his to lose three days in therapies with his knee issues. Family here going through on-going education and learning her care. MD adjusting BP meds and CHF is stable watching labs. Getting back to min assist level in therapies. Ramp to be built this week.  Revisions to Treatment Plan:  DC 7/3 extended    Continued Need for Acute Rehabilitation Level of Care: The patient requires daily medical management by a physician with specialized training in physical medicine and rehabilitation for the following conditions: Daily direction of a multidisciplinary physical rehabilitation program to ensure safe treatment while eliciting the highest outcome that is of practical value to the patient.: Yes Daily medical management of patient stability for increased activity during participation in an  intensive rehabilitation regime.: Yes Daily analysis of laboratory values and/or radiology reports with any subsequent need for medication adjustment of medical intervention for : Other;Neurological problems;Blood pressure problems;Cardiac problems   Elease Hashimoto 09/06/2017, 1:59 PM                 Patient ID: [name], male   DOB: 1935-11-19, 82 y.o.   MRN: [id number]

## 2017-09-06 NOTE — Progress Notes (Signed)
Physical Therapy Session Note  Patient Details  Name: JAMAURI KRUZEL MRN: [id number] Date of Birth: 1935/08/12  Today's Date: 09/06/2017 PT Individual Time: 0915-1000 and 1616-1700 PT Individual Time Calculation (min): 45 min and 44 min   Short Term Goals: Week 3:  PT Short Term Goal 1 (Week 3): =LTGs due to ELOS  Skilled Therapeutic Interventions/Progress Updates:   Session 1:  Pt supine in bed upon PT arrival, agreeable to therapy tx and reports pain 4/10 in R knee. Pt transferred from supine>sitting EOB with min assist, verbal cues for techniques. Pt performed sit<>stand from EOB with max assist, increased pushing to the L and posterior. Stand pivot to the w/c towards the R with max assist, verbal cues for techniques. Pt's daughter present for family training, therapist educated throughout session however held off on hands on training due to amount of physical assist needed at this point. Pt transported to the gym in w/c. Pt performed stand pivot with hemiwalker towards the mat with mod assist, therapist educating pt and family on techniques and set up. Pt performed sit<>stands from mat this session with mod assist and hemiwalker, working on maintaining balance, finding midline and minimizing pushing. Pt transferred back to w/c with mod assist and hemiwalker, stand pivot. Pt transported back to room and left seated in w/c in care of family.  Session 2: Pt seated in w/c upon PT arrival, agreeable to therapy tx and denies pain. Pt transported to the gym. Pt performed stand pivot transfer with mod assist to the R, verbal cues for techniques. Pt performed sit<>stands from edge of mat this session with min-mod assist and use of R hemiwalker. In standing pt worked on pre-gait with hemi walker, stepping forward/back with R LE for focus on L stance control, x 2 trials. Pt transferred from sitting>supine with min assist for L LE management. Pt performed bridging with B LEs in order to scoot hips.  Transferred to R sidelying with min assist and verbal cues for techniques. Pt worked on gravity eliminated L LE AAROM for neuro re-ed including hip flexion/extension and knee flexion/extension. Pt only able to activate hip flexors inconsistently. Pt transferred to sitting with min assist and transferred back to w/c, stand pivot mod assist. Pt transported back to room and left seated with needs in reach.     Therapy Documentation Precautions:  Precautions Precautions: Fall Precaution Comments: L sided weakness Restrictions Weight Bearing Restrictions: No   See Function Navigator for Current Functional Status.   Therapy/Group: Individual Therapy  Netta Corrigan, PT, DPT 09/06/2017, 7:46 AM

## 2017-09-06 NOTE — Progress Notes (Signed)
[facility] PHYSICAL MEDICINE & REHABILITATION     PROGRESS NOTE  Subjective/Complaints:  Pt seen lying in bed this AM.  He slept well overnight.  His knee continues to improve.   ROS: Denies CP, SOB, nausea, vomiting, diarrhea  Objective: Vital Signs: Blood pressure (!) 123/57, pulse 76, temperature 98.6 F (37 C), resp. rate 18, height 5\' 3"  (1.6 m), weight 79.4 kg (175 lb), SpO2 97 %. No results found. No results for input(s): WBC, HGB, HCT, PLT in the last 72 hours. No results for input(s): NA, K, CL, GLUCOSE, BUN, CREATININE, CALCIUM in the last 72 hours.  Invalid input(s): CO CBG (last 3)  Recent Labs    09/05/17 1658 09/05/17 2114 09/06/17 0646  GLUCAP 145* 122* 133*    Wt Readings from Last 3 Encounters:  09/06/17 79.4 kg (175 lb)  08/10/17 84.4 kg (186 lb)  07/18/17 87.1 kg (192 lb)    Physical Exam:  BP (!) 123/57 (BP Location: Right Arm)   Pulse 76   Temp 98.6 F (37 C)   Resp 18   Ht 5\' 3"  (1.6 m)   Wt 79.4 kg (175 lb)   SpO2 97%   BMI 31.00 kg/m  Constitutional: No distress . Vital signs reviewed. HENT: Normocephalic.  Atraumatic. Eyes: EOMI. No discharge. Cardiovascular: RRR. No JVD. Respiratory: CTA Bilaterally. Normal effort. GI: BS +. Non-distended. Musc: Right knee with edema and tenderness, improving Neurological: He is alert and oriented Follows full commands.  Fair awareness of deficits.  Motor: LUE: shoulder abduction, elbow flexion/extension 1/5, hand grip 2+/5 (unchanged) LLE: hip flexion 2/5, distally trace (unchanged) Skin: Skin is warm and dry.  Psychiatric: He has a normal mood and affect. His behavior is normal.   Assessment/Plan: 1. 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 Physiatrist is providing close team supervision and 24 hour management of active medical problems listed  below. Physiatrist and rehab team continue to assess barriers to discharge/monitor patient progress toward functional and medical goals.  Function:  Bathing Bathing position   Position: Shower  Bathing parts Body parts bathed by patient: Chest, Abdomen, Right upper leg, Left upper leg, Front perineal area, Left arm, Right lower leg Body parts bathed by helper: Buttocks, Left arm  Bathing assist Assist Level: Touching or steadying assistance(Pt > 75%), Assistive device Assistive Device Comment: LH sponge    Upper Body Dressing/Undressing Upper body dressing   What is the patient wearing?: Pull over shirt/dress     Pull over shirt/dress - Perfomed by patient: Thread/unthread left sleeve, Thread/unthread right sleeve, Put head through opening, Pull shirt over trunk Pull over shirt/dress - Perfomed by helper: Pull shirt over trunk        Upper body assist Assist Level: Supervision or verbal cues, Set up   Set up : To obtain clothing/put away  Lower Body Dressing/Undressing Lower body dressing   What is the patient wearing?: Pants, Socks, Shoes   Underwear - Performed by helper: Thread/unthread right underwear leg, Thread/unthread left underwear leg, Pull underwear up/down Pants- Performed by patient: Thread/unthread right pants leg, Pull pants up/down Pants- Performed by helper: Thread/unthread left pants leg   Non-skid slipper socks- Performed by helper: Don/doff right sock, Don/doff left sock   Socks - Performed by helper: Don/doff right sock, Don/doff left sock Shoes - Performed by patient: Don/doff right shoe, Fasten right Shoes - Performed by helper: Don/doff left  shoe, Fasten left          Lower body assist Assist for lower body dressing: Touching or steadying assistance (Pt > 75%)      Toileting Toileting Toileting activity did not occur: No continent bowel/bladder event Toileting steps completed by patient: Adjust clothing prior to toileting, Adjust clothing after  toileting Toileting steps completed by helper: Adjust clothing prior to toileting, Performs perineal hygiene, Adjust clothing after toileting Toileting Assistive Devices: Grab bar or rail  Toileting assist Assist level: Two helpers   Transfers Chair/bed transfer   Chair/bed transfer method: Stand pivot Chair/bed transfer assist level: Maximal assist (Pt 25 - 49%/lift and lower) Chair/bed transfer assistive device: Other(hemiwalker) Mechanical lift: Stedy   Locomotion Ambulation     Max distance: 25' Assist level: Maximal assist (Pt 25 - 49%)   Wheelchair   Type: Manual Max wheelchair distance: 150' Assist Level: Supervision or verbal cues  Cognition Comprehension Comprehension assist level: Follows basic conversation/direction with extra time/assistive device  Expression Expression assist level: Expresses basic needs/ideas: With no assist  Social Interaction Social Interaction assist level: Interacts appropriately 90% of the time - Needs monitoring or encouragement for participation or interaction.  Problem Solving Problem solving assist level: Solves basic problems with no assist  Memory Memory assist level: Complete Independence: No helper    Medical Problem List and Plan:  1. 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 Aspirin and Plavix x3 weeks then aspirin alone off Plavix  6/20  Cont CIR  WHO/PRAFO  2. DVT Prophylaxis/Anticoagulation: Subcutaneous Lovenox  3. Pain Management: Tylenol as needed  4. Mood: Provide emotional support  5. Neuropsych: This patient is capable of making decisions on his own behalf.  6. Skin/Wound Care: Routine skin checks  7. Fluids/Electrolytes/Nutrition: Routine in and outs  8. CAD with CABG. Continue aspirin and Plavix. No chest pain or shortness of breath  9. Diabetes mellitus. Hemoglobin A1c 6.0. SSI. Check blood sugars before meals and at bedtime   Home meds  Glucophage 1000 mg twice daily, Amaryl 4 mg daily, Lantus insulin 10 units nightly, Tradjenta 5 mg daily  CBG (last 3)  Recent Labs    09/05/17 1658 09/05/17 2114 09/06/17 0646  GLUCAP 145* 122* 133*   Tradjenta 5mg , Metformin 500mg  BID  Labile on 6/26 10. Dysphagia. Dysphasia #2 nectar liquids. Follow-up speech therapy  11. Hypertension. Cozaar 25 mg daily, Toprol 50 mg daily  Vitals:   09/05/17 2002 09/06/17 0432  BP: 138/69 (!) 123/57  Pulse: 81 76  Resp: 20 18  Temp: 98.7 F (37.1 C) 98.6 F (37 C)  SpO2: 98% 97%   Controlled 6/26 12. Diastolic congestive heart failure. Monitor for any signs of fluid overload. Patient was taking Lasix 40 mg daily if needed for fluid retention prior to admission.   No SOB, restarted low dose Lasix 10mg , increased to 20mg     Filed Weights   09/04/17 0600 09/05/17 0630 09/06/17 0432  Weight: 81.7 kg (180 lb 1.9 oz) 78.9 kg (174 lb) 79.4 kg (175 lb)   ?Reliability on 6/26 13. History of seizure disorder. Depakote 500 mg daily.  14. Hyperlipidemia. Lipitor  15. History of alcohol use. Monitor for withdrawal  16.  Elevated Creat resolved, remains normal on Metformin 17.  Constipation-improved, reduced senna to 1po BID, continent of stool continue Bisacodyl supp prn 18. Knee OA pain  Multifactorial - OA + gout  X-ray is reviewed 04/2017 showing OA  History  of gout with elevated uric acid level.  Colchicine started on 6/25  Voltaren gel  Improving   LOS (Days) 26 A FACE TO FACE EVALUATION WAS PERFORMED  Ankit Lorie Phenix 09/06/2017 11:31 AM

## 2017-09-07 ENCOUNTER — Inpatient Hospital Stay (HOSPITAL_COMMUNITY): Payer: Medicare Other | Admitting: Physical Therapy

## 2017-09-07 ENCOUNTER — Inpatient Hospital Stay (HOSPITAL_COMMUNITY): Payer: Medicare Other | Admitting: Occupational Therapy

## 2017-09-07 LAB — GLUCOSE, CAPILLARY
GLUCOSE-CAPILLARY: 127 mg/dL — AB (ref 70–99)
GLUCOSE-CAPILLARY: 136 mg/dL — AB (ref 70–99)
GLUCOSE-CAPILLARY: 119 mg/dL — AB (ref 70–99)
Glucose-Capillary: 176 mg/dL — ABNORMAL HIGH (ref 70–99)

## 2017-09-07 NOTE — Progress Notes (Signed)
Physical Therapy Weekly Progress Note  Patient Details  Name: Fred [name] MRN: [id number] Date of Birth: 03-May-1935  Beginning of progress report period: August 29, 2017 End of progress report period: September 07, 2017  Today's Date: 09/07/2017 PT Individual Time: 0800-0857 AND 1130-1155 AND 1600-1700 PT Individual Time Calculation (min): 57 min AND 25 min AND 60 min  Patient has met 2 of 7 long term goals. Pt continues to make progress towards LTGs. He has been limited over the last 3-4 days 2/2 R knee pain w/ gout flare up, although this is now managed and not limiting his functional mobility. Pt's stay extended as these days were dedicated family education days and pt was not able to meaningfully participate. He has now returned to pre-gout functional status and benefiting from increased stay to continue family education in preparation for d/c home. He continues to require mod assist overall for functional mobility at the w/c level including transfers and short distance gait w/ hemiwalker.   Patient continues to demonstrate the following deficits muscle weakness, decreased cardiorespiratoy endurance, unbalanced muscle activation, decreased coordination and decreased motor planning and decreased standing balance, decreased postural control, hemiplegia and decreased balance strategies and therefore will continue to benefit from skil led PT intervention to increase functional independence with mobility.  Patient progressing toward long term goals..  Continue plan of care.  PT Short Term Goals Week 3:  PT Short Term Goal 1 (Week 3): =LTGs due to ELOS Week 4:  PT Short Term Goal 1 (Week 4): =LTGs due to ELOS  Skilled Therapeutic Interventions/Progress Updates:   Session 1:  Pt in supine and agreeable to therapy, pain in R knee 1-2/10 at rest and in standing this session. Transferred to EOB and to w/c via stand pivot w/ mod assist. Pt self-propelled w/c to therapy gym for warm-up. Session focused  on NMR, postural control, facilitating anterior pelvic tilt, and decreasing lateropulsion. Worked on R lateral weight shifting, during RUE reaching tasks, while straddling bench to facilitate anterior pelvic tilt and anterior weight shifting. RUE reaching tasks required R trunk elongation and trunk rotation to both sides. Tactile and verbal cues for postural control. Improved static stance w/ hemiwalker after reaching activity, able to maintain static stance w/ close supervision w/ midline control as well. Required min-mod assist for static stance prior to activity. Additionally performed sit<>stands w/ RUE reaching to target during transition to facilitate increased anterior weight shift, mod assist to facilitate boosting and L hip and knee extension once in stance. Returned to room and ended session in w/c, call bell within reach and all needs met. Set-up to eat breakfast.   Session 2:  Pt in w/c and agreeable to therapy, denies pain. Session focused on gait and pre-gait tasks. Total assist w/c transport to/from therapy gym. Performed alternating forward and backward stepping w/ bilateral LEs w/ emphasis on R weight shift to clear LLE. Max-total assist for L step placement w/ tactile and verbal cues for increased L hip flexor activation. Ambulated 30' at rail w/ min-mod assist overall for upright balance/postural control and facilitating lateral weight shifting, max-total assist again for L step placement. Returned to room and ended session in recliner, call bell within reach and all needs met.   Session 3: Pt in w/c and agreeable to therapy, denies pain. Session focused on L hip flexor activation for improved L swing limb advancement. Ambulated 20' w/ lite gait to unweight body for improved L foot clearance, however pt w/ increased difficulty maintaining postural  control, making take non-functional. Worked on lateral weight shifts while standing at rail w/ max assist to manually facilitate L hip flexion and  step placement while stepping forward and backward. Also performed same task while standing on 2" step under R foot. Transitioned to L sidelying to work on hip flexion in gravity eliminated position. Very trace muscle activation w/ verbal cues for hip flexion, increased hip flexor activation w/ visual target to hit w/ knee. Returned to room and ended session in w/c and in care of family, all needs met.   Therapy Documentation Precautions:  Precautions Precautions: Fall Precaution Comments: L sided weakness Restrictions Weight Bearing Restrictions: No Vital Signs:   See Function Navigator for Current Functional Status.  Therapy/Group: Individual Therapy  Edelmiro Innocent K Arnette 09/07/2017, 12:03 PM

## 2017-09-07 NOTE — Plan of Care (Signed)
  Problem: Consults Goal: RH STROKE PATIENT EDUCATION Description See Patient Education module for education specifics  Outcome: Progressing   Problem: RH BOWEL ELIMINATION Goal: RH STG MANAGE BOWEL W/MEDICATION W/ASSISTANCE Description STG Manage Bowel with Medication with  Mod I Assistance.  Outcome: Progressing Flowsheets (Taken 09/07/2017 1329) STG: Pt will manage bowels with medication with assistance: 6-Modified independent   Problem: RH BLADDER ELIMINATION Goal: RH STG MANAGE BLADDER WITH ASSISTANCE Description STG Manage Bladder With  Min Assistance  Outcome: Progressing Flowsheets (Taken 09/07/2017 1329) STG: Pt will manage bladder with assistance: 5-Supervision/set up   Problem: RH SAFETY Goal: RH STG ADHERE TO SAFETY PRECAUTIONS W/ASSISTANCE/DEVICE Description STG Adhere to Safety Precautions With  Mod cues/ supervision Assistance/Device.   Outcome: Progressing Flowsheets (Taken 09/07/2017 1329) STG:Pt will adhere to safety precautions with assistance/device: 3-Moderate assistance   Problem: RH KNOWLEDGE DEFICIT Goal: RH STG INCREASE KNOWLEDGE OF DIABETES Description Pt will be able to explain how to monitor and manage diabetes/diet/medications using notes/resources independently  Outcome: Progressing Goal: RH STG INCREASE KNOWLEDGE OF HYPERTENSION Description Pt will be able to demo understanding of HH/CMM diet and medications to manage HTN and state what to do to manage HTN and when to take medications  Outcome: Progressing

## 2017-09-07 NOTE — Progress Notes (Signed)
[place] PHYSICAL MEDICINE & REHABILITATION     PROGRESS NOTE  Subjective/Complaints:  Patient seen lying in bed this morning. He states he slept well overnight.  ROS: denies CP, SOB, nausea, vomiting, diarrhea  Objective: Vital Signs: Blood pressure (!) 127/58, pulse 80, temperature 97.8 F (36.6 C), temperature source Oral, resp. rate 18, height 5\' 3"  (1.6 m), weight 78.9 kg (174 lb), SpO2 98 %. No results found. No results for input(s): WBC, HGB, HCT, PLT in the last 72 hours. No results for input(s): NA, K, CL, GLUCOSE, BUN, CREATININE, CALCIUM in the last 72 hours.  Invalid input(s): CO CBG (last 3)  Recent Labs    09/06/17 1723 09/06/17 2134 09/07/17 0640  GLUCAP 134* 123* 127*    Wt Readings from Last 3 Encounters:  09/07/17 78.9 kg (174 lb)  08/10/17 84.4 kg (186 lb)  07/18/17 87.1 kg (192 lb)    Physical Exam:  BP (!) 127/58 (BP Location: Right Arm)   Pulse 80   Temp 97.8 F (36.6 C) (Oral)   Resp 18   Ht 5\' 3"  (1.6 m)   Wt 78.9 kg (174 lb)   SpO2 98%   BMI 30.82 kg/m  Constitutional: No distress . Vital signs reviewed. HENT: Normocephalic.  Atraumatic. Eyes: EOMI. No discharge. Cardiovascular: RRR. No JVD. Respiratory: CTA Bilaterally. Normal effort. GI: BS +. Non-distended. Musc: Right knee with edema and tenderness, improving Neurological: He is alert and oriented Follows full commands.  Fair awareness of deficits.  Motor: LUE: shoulder abduction, elbow flexion/extension 1/5, hand grip 2+/5 (stable). Increase in tone LLE: hip flexion 2/5, distally trace (stable) Skin: Skin is warm and dry.  Psychiatric: He has a normal mood and affect. His behavior is normal.   Assessment/Plan: 1. 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 Physiatrist is providing close team supervision and 24 hour management of  active medical problems listed below. Physiatrist and rehab team continue to assess barriers to discharge/monitor patient progress toward functional and medical goals.  Function:  Bathing Bathing position   Position: Shower  Bathing parts Body parts bathed by patient: Chest, Abdomen, Right upper leg, Left upper leg, Front perineal area, Left arm, Right lower leg Body parts bathed by helper: Buttocks, Left arm  Bathing assist Assist Level: Touching or steadying assistance(Pt > 75%), Assistive device Assistive Device Comment: LH sponge    Upper Body Dressing/Undressing Upper body dressing   What is the patient wearing?: Pull over shirt/dress     Pull over shirt/dress - Perfomed by patient: Thread/unthread left sleeve, Thread/unthread right sleeve, Put head through opening, Pull shirt over trunk Pull over shirt/dress - Perfomed by helper: Pull shirt over trunk        Upper body assist Assist Level: Supervision or verbal cues, Set up   Set up : To obtain clothing/put away  Lower Body Dressing/Undressing Lower body dressing   What is the patient wearing?: Pants, Socks, Shoes   Underwear - Performed by helper: Thread/unthread right underwear leg, Thread/unthread left underwear leg, Pull underwear up/down Pants- Performed by patient: Thread/unthread right pants leg, Pull pants up/down Pants- Performed by helper: Thread/unthread left pants leg   Non-skid slipper socks- Performed by helper: Don/doff right sock, Don/doff left sock   Socks - Performed by helper: Don/doff right sock, Don/doff left sock Shoes - Performed by patient: Don/doff right shoe, Fasten right Shoes - Performed by helper: Don/doff  left shoe, Fasten left          Lower body assist Assist for lower body dressing: Touching or steadying assistance (Pt > 75%)      Toileting Toileting Toileting activity did not occur: No continent bowel/bladder event Toileting steps completed by patient: Adjust clothing prior to  toileting, Adjust clothing after toileting Toileting steps completed by helper: Performs perineal hygiene Toileting Assistive Devices: Grab bar or rail  Toileting assist Assist level: Touching or steadying assistance (Pt.75%)   Transfers Chair/bed transfer   Chair/bed transfer method: Stand pivot Chair/bed transfer assist level: Maximal assist (Pt 25 - 49%/lift and lower) Chair/bed transfer assistive device: Other(hemiwalker) Mechanical lift: Stedy   Locomotion Ambulation     Max distance: 25' Assist level: Maximal assist (Pt 25 - 49%)   Wheelchair   Type: Manual Max wheelchair distance: 150' Assist Level: Supervision or verbal cues  Cognition Comprehension Comprehension assist level: Follows basic conversation/direction with extra time/assistive device  Expression Expression assist level: Expresses basic needs/ideas: With no assist  Social Interaction Social Interaction assist level: Interacts appropriately 90% of the time - Needs monitoring or encouragement for participation or interaction.  Problem Solving Problem solving assist level: Solves basic problems with no assist  Memory Memory assist level: Complete Independence: No helper    Medical Problem List and Plan:  1. 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 Aspirin and Plavix x3 weeks then aspirin alone off Plavix  6/20  Cont CIR  WHO/PRAFO  2. DVT Prophylaxis/Anticoagulation: Subcutaneous Lovenox  3. Pain Management: Tylenol as needed  4. Mood: Provide emotional support  5. Neuropsych: This patient is capable of making decisions on his own behalf.  6. Skin/Wound Care: Routine skin checks  7. Fluids/Electrolytes/Nutrition: Routine in and outs   Labs ordered for tomorrow 8. CAD with CABG. Continue aspirin and Plavix. No chest pain or shortness of breath  9. Diabetes mellitus. Hemoglobin A1c 6.0. SSI. Check blood sugars before meals and at  bedtime   Home meds Glucophage 1000 mg twice daily, Amaryl 4 mg daily, Lantus insulin 10 units nightly, Tradjenta 5 mg daily  CBG (last 3)  Recent Labs    09/06/17 1723 09/06/17 2134 09/07/17 0640  GLUCAP 134* 123* 127*   Tradjenta 5mg , Metformin 500mg  BID  Slightly labile on 6/27 10. Dysphagia. Dysphasia #2 nectar liquids. Follow-up speech therapy  11. Hypertension. Cozaar 25 mg daily, Toprol 50 mg daily  Vitals:   09/06/17 2011 09/07/17 0547  BP: (!) 124/56 (!) 127/58  Pulse: 72 80  Resp: 17 18  Temp: 98.7 F (37.1 C) 97.8 F (36.6 C)  SpO2: 99% 98%   Controlled 6/27 12. Diastolic congestive heart failure. Monitor for any signs of fluid overload. Patient was taking Lasix 40 mg daily if needed for fluid retention prior to admission.   No SOB, restarted low dose Lasix 10mg , increased to 20mg     Filed Weights   09/05/17 0630 09/06/17 0432 09/07/17 0547  Weight: 78.9 kg (174 lb) 79.4 kg (175 lb) 78.9 kg (174 lb)   Stable on 6/27 13. History of seizure disorder. Depakote 500 mg daily.  14. Hyperlipidemia. Lipitor  15. History of alcohol use. Monitor for withdrawal  16.  Elevated Creat resolved, remains normal on Metformin 17.  Constipation-improved, reduced senna to 1po BID, continent of stool continue Bisacodyl supp prn 18. Knee OA pain  Multifactorial - OA + gout  X-ray is reviewed 04/2017 showing OA  History  of gout with elevated uric acid level.  Colchicine started on 6/25  Voltaren gel  Improving   LOS (Days) 27 A FACE TO FACE EVALUATION WAS PERFORMED  Mayda Shippee Lorie Phenix 09/07/2017 9:17 AM

## 2017-09-07 NOTE — Progress Notes (Signed)
Occupational Therapy Session Note  Patient Details  Name: Fred [name] MRN: [id number] Date of Birth: [birth year]  Today's Date: 09/07/2017 OT Individual Time: 1445-1540 OT Individual Time Calculation (min): 55 min     Skilled Therapeutic Interventions/Progress Updates:    Upon entering the room, pt seated in wheelchair awaiting therapist arrival. Pt utilized hemiplegic technique to propel wheelchair 75' to day room with supervision and increased time. Pt standing from wheelchair with mod lifting assistance and L knee blocked. Pt standing at table top with L UE placed into weight bearing position and pt reaching laterally to the R for pegs, returning to midline, and placing into board. Pt utilized mirror for visual feedback as well and mod multimodal cues while in standing regarding weight shift and posture. Pt standing for 4 minutes and then 8.5 minutes respectively. Pt taking seated rest break between standing in which OT provided education regarding secondary stroke risk and risk factors. Pt verbalized understanding. Pt also demonstrated self ROM for L UE digits, wrist, and elbow with min verbal cues for technique. Pt propelled self back to room and remained seated in wheelchair with family present in the room. Call bell and all needed items within reach.   Therapy Documentation Precautions:  Precautions Precautions: Fall Precaution Comments: L sided weakness Restrictions Weight Bearing Restrictions: No   ADL: ADL ADL Comments: see functional navigator  See Function Navigator for Current Functional Status.   Therapy/Group: Individual Therapy    Gypsy Decant 09/07/2017, 3:47 PM

## 2017-09-08 ENCOUNTER — Inpatient Hospital Stay (HOSPITAL_COMMUNITY): Payer: Medicare Other | Admitting: Physical Therapy

## 2017-09-08 ENCOUNTER — Inpatient Hospital Stay (HOSPITAL_COMMUNITY): Payer: Medicare Other | Admitting: Occupational Therapy

## 2017-09-08 DIAGNOSIS — D62 Acute posthemorrhagic anemia: Secondary | ICD-10-CM

## 2017-09-08 LAB — CBC WITH DIFFERENTIAL/PLATELET
ABS IMMATURE GRANULOCYTES: 0 10*3/uL (ref 0.0–0.1)
BASOS ABS: 0.1 10*3/uL (ref 0.0–0.1)
BASOS PCT: 1 %
EOS ABS: 0.2 10*3/uL (ref 0.0–0.7)
Eosinophils Relative: 5 %
HCT: 39 % (ref 39.0–52.0)
Hemoglobin: 12.8 g/dL — ABNORMAL LOW (ref 13.0–17.0)
IMMATURE GRANULOCYTES: 0 %
Lymphocytes Relative: 43 %
Lymphs Abs: 2.2 10*3/uL (ref 0.7–4.0)
MCH: 31.4 pg (ref 26.0–34.0)
MCHC: 32.8 g/dL (ref 30.0–36.0)
MCV: 95.6 fL (ref 78.0–100.0)
MONOS PCT: 11 %
Monocytes Absolute: 0.6 10*3/uL (ref 0.1–1.0)
NEUTROS ABS: 2 10*3/uL (ref 1.7–7.7)
NEUTROS PCT: 40 %
PLATELETS: 233 10*3/uL (ref 150–400)
RBC: 4.08 MIL/uL — AB (ref 4.22–5.81)
RDW: 12.2 % (ref 11.5–15.5)
WBC: 5 10*3/uL (ref 4.0–10.5)

## 2017-09-08 LAB — BASIC METABOLIC PANEL
Anion gap: 10 (ref 5–15)
BUN: 29 mg/dL — ABNORMAL HIGH (ref 8–23)
CALCIUM: 9 mg/dL (ref 8.9–10.3)
CO2: 26 mmol/L (ref 22–32)
CREATININE: 1.13 mg/dL (ref 0.61–1.24)
Chloride: 105 mmol/L (ref 98–111)
GFR, EST NON AFRICAN AMERICAN: 59 mL/min — AB (ref >60)
Glucose, Bld: 125 mg/dL — ABNORMAL HIGH (ref 70–99)
Potassium: 3.7 mmol/L (ref 3.5–5.1)
SODIUM: 141 mmol/L (ref 135–145)

## 2017-09-08 LAB — GLUCOSE, CAPILLARY
GLUCOSE-CAPILLARY: 125 mg/dL — AB (ref 70–99)
GLUCOSE-CAPILLARY: 178 mg/dL — AB (ref 70–99)
GLUCOSE-CAPILLARY: 141 mg/dL — AB (ref 70–99)
GLUCOSE-CAPILLARY: 131 mg/dL — AB (ref 70–99)

## 2017-09-08 NOTE — Plan of Care (Signed)
Goals downgraded to mod A overall due to slow pt progress. Have d/c tub/shower transfer goal as not recommending at this time for safety concerns. See POC for goal details. Ziya Coonrod, OTR/L

## 2017-09-08 NOTE — Progress Notes (Signed)
Physical Therapy Session Note  Patient Details  Name: Fred [name] MRN: [id number] Date of Birth: 1935-03-26  Today's Date: 09/08/2017 PT Individual Time: 1191-4782 AND 1200-1215 AND 9562-1308 PT Individual Time Calculation (min): 83 min AND 15 min AND 60 min  Short Term Goals: Week 4:  PT Short Term Goal 1 (Week 4): =LTGs due to ELOS  Skilled Therapeutic Interventions/Progress Updates:   Session 1:  Pt in supine and agreeable to therapy, pain 2/10 in R knee this session and it did not limit any mobility. Session focused on NMR including NuStep and R weight shifting for improved L foot clearance during gait. Transferred to w/c w/ min assist via stand pivot to R. Performed NuStep 10 min @ level 3 for reciprocal movement pattern and focused activation of L hip musculature. Verbal, visual, and tactile cues throughout. Worked on R weight shifting while standing w/ hemiwalker support on 3" step w/ LLE hanging off. Mirror visual cues and max manual facilitation of R WB and midline orientation. Transitioned to standing perpendicular to mat table raised to height of hemiwalker to allow open R hand WB to facilitate less lateropulsion to L side w/ more success. Practiced lateral weight shifts w/ target of R hip to mat table as well as RUE reaching tasks. Attempted to kick ball w/ LLE, however breakdown of R weight shifting occurred when pt attempted to do this. Increased L hip activation this session compared to previous sessions, remains trace. Continued w/ working on static standing w/ R hip touching table in multiple 2-3 min bouts w/ min guard for safety and verbal and tactile cues for posture at L glut musculature and at upper chest. Mirror visual feedback as well. Returned to room and ended session in w/c, call bell within reach and all needs met. Set-up to eat breakfast.   Session 2:  Daughter present and therapist provided skilled education regarding d/c plans including providing home measurement  sheet. Daughter reporting that family plans to have either ramp built or temporary ramp in place upon d/c. Discussed home set-up and important measurements needed including doorways, bathroom, bed level height, etc. Educated on importance of ordering w/c that fits dimensions and that pt will need to sponge bathe if he cannot get a w/c around in his bathroom. Daughter verbalized understanding, and in agreement. She states the family may renovate bathroom for improved w/c maneuvering. Daughter agreeable to return home measurement sheet. Pt in care of daughter at end of session, all needs met.   Session 3:  Pt in w/c and agreeable to therapy, denies pain. Session focused on blocked practice of functional transfers, L NMR and pre-gait tasks. Performed multiple stand pivot transfers in both directions w/ mod fading to min assist. Hemiwalker used for support when transferring towards L hemi side. Additionally practiced car transfer using same technique. Verbal and manual cues for weight shifting and step placement. Took AFO and shoes off for decreased weight on foot for stepping and improved L foot clearance to carryover to gait. High/low table placed for RUE support to facilitate decreased lateropulsion w/ open hand placement. L pushing tendencies much improved with this technique and pt was able to kick beach ball >30 times w/ LLE. Min manual and verbal cues to kick, visual target increased activation as well. Returned to room and ended session in w/c and in care of family, all needs met.   Therapy Documentation Precautions:  Precautions Precautions: Fall Precaution Comments: L sided weakness Restrictions Weight Bearing Restrictions: No Vital Signs:  Therapy Vitals Temp: 99 F (37.2 C) Temp Source: Oral Pulse Rate: 75 Resp: 18 BP: 123/67 Patient Position (if appropriate): Lying Oxygen Therapy SpO2: 97 % O2 Device: Room Air  See Function Navigator for Current Functional Status.   Therapy/Group:  Individual Therapy  Paulene Tayag K Arnette 09/08/2017, 9:26 AM

## 2017-09-08 NOTE — Progress Notes (Signed)
Occupational Therapy Session Note  Patient Details  Name: Fred [name] MRN: [id number] Date of Birth: 03-08-1936  Today's Date: 09/08/2017 OT Individual Time: 5397-6734 OT Individual Time Calculation (min): 60 min    Short Term Goals: Week 4:  OT Short Term Goal 1 (Week 4): STG=LTG 2/2 ELOS  Skilled Therapeutic Interventions/Progress Updates:    Pt seen for OT session focusing on neuro re-ed with use of e-stim and functional transfers. Pt sitting up in w/c upon arrival, agreeable to tx session and denying pain. Pt desiring to work on L UE.  Pt taken to therapy gym total A in w/c for time and energy conservation. Completed L UE e-stim, see details below.   1:1 NMES applied to dorsal aspect of L forearm for wrist/ hand for wrist and hand extension.  Ratio 1:3 Rate 35 pps Waveform- Asymmetric Ramp 1.0 Pulse 300 Intensity- 29 Duration -   13 minutes  Pt able to achieve full extension at wrist and all digits with e-stim, limited carry over following e-stim however.   Pt then completed functional transfers w/c > tub transfer bench. Pt able to direct therapist with set-up, however, states that he does not need physical assist from caregiver- demonstrates poor safety awareness and poor awareness of deficits. Pt required mod A to stand at hemi walker from w/c and max A for stand/squat pivot to tub bench. Attempted to slide in on tub bench, having to slide to L in simulate home set-up. Partial way through transfer therapist deemed to unsafe to cont due to pt's poor sitting balance and ability to complete reciprocal hip movement. Recommend pt sponge bathe at d/c at this time due to safety concerns with transfer, pt voiced understanding. Returned to w/c via squat pivot with min A transferring to strong R side. Then completed w/c>toilet transfer, set-up  For stand pivot as pt believes he will be able to do at d/c. Mod A to stand from w/c, Pt's l knee buckling and pt falling forward into wall.  Required total A to return safely to w/c. Pt reports fatigue and LE weakness as reasons transfers not successful this session. Reviewed need for safe transfer methods for various fatigue levels he will experience throughout the day at d/c. Recommending squat pivot to drop arm BSC as safest method. Will practice at future sessions. Pt left seated in w/c at end of session, all needs in reach with QRB donned.   Therapy Documentation Precautions:  Precautions Precautions: Fall Precaution Comments: L sided weakness Restrictions Weight Bearing Restrictions: No Pain:   No/denies pain ADL: ADL ADL Comments: see functional navigator  See Function Navigator for Current Functional Status.   Therapy/Group: Individual Therapy  Graiden Henes L 09/08/2017, 7:07 AM

## 2017-09-08 NOTE — Progress Notes (Signed)
[place] PHYSICAL MEDICINE & REHABILITATION     PROGRESS NOTE  Subjective/Complaints:  Patient seen lying in bed this morning. He states he slept well overnight. He states he is getting better every day.  ROS: denies CP, SOB, nausea, vomiting, diarrhea  Objective: Vital Signs: Blood pressure 123/67, pulse 75, temperature 99 F (37.2 C), temperature source Oral, resp. rate 18, height 5\' 3"  (1.6 m), weight 81.2 kg (179 lb 0.2 oz), SpO2 97 %. No results found. Recent Labs    09/08/17 0501  WBC 5.0  HGB 12.8*  HCT 39.0  PLT 233   Recent Labs    09/08/17 0501  NA 141  K 3.7  CL 105  GLUCOSE 125*  BUN 29*  CREATININE 1.13  CALCIUM 9.0   CBG (last 3)  Recent Labs    09/07/17 2157 09/08/17 0635 09/08/17 1148  GLUCAP 119* 125* 178*    Wt Readings from Last 3 Encounters:  09/08/17 81.2 kg (179 lb 0.2 oz)  08/10/17 84.4 kg (186 lb)  07/18/17 87.1 kg (192 lb)    Physical Exam:  BP 123/67 (BP Location: Left Arm)   Pulse 75   Temp 99 F (37.2 C) (Oral)   Resp 18   Ht 5\' 3"  (1.6 m)   Wt 81.2 kg (179 lb 0.2 oz)   SpO2 97%   BMI 31.71 kg/m  Constitutional: No distress . Vital signs reviewed. HENT: Normocephalic.  Atraumatic. Eyes: EOMI. No discharge. Cardiovascular: RRR. No JVD. Respiratory: CTA Bilaterally. Normal effort. GI: BS +. Non-distended. Musc: Right knee with edema and tenderness, continues to improve Neurological: He is alert and oriented Follows full commands.  Fair awareness of deficits.  Motor: LUE: shoulder abduction, elbow flexion/extension 1/5, hand grip 2+/5 (stable). Increase in tone LLE: hip flexion 2/5, distally trace (unchanged) Skin: Skin is warm and dry.  Psychiatric: He has a normal mood and affect. His behavior is normal.   Assessment/Plan: 1. 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 Physiatrist is providing close team supervision and 24 hour management of active medical problems listed below. Physiatrist and rehab team continue to assess barriers to discharge/monitor patient progress toward functional and medical goals.  Function:  Bathing Bathing position   Position: Shower  Bathing parts Body parts bathed by patient: Chest, Abdomen, Right upper leg, Left upper leg, Front perineal area, Left arm, Right lower leg Body parts bathed by helper: Buttocks, Left arm  Bathing assist Assist Level: Touching or steadying assistance(Pt > 75%), Assistive device Assistive Device Comment: LH sponge    Upper Body Dressing/Undressing Upper body dressing   What is the patient wearing?: Pull over shirt/dress     Pull over shirt/dress - Perfomed by patient: Thread/unthread left sleeve, Thread/unthread right sleeve, Put head through opening, Pull shirt over trunk Pull over shirt/dress - Perfomed by helper: Pull shirt over trunk        Upper body assist Assist Level: Supervision or verbal cues, Set up   Set up : To obtain clothing/put away  Lower Body Dressing/Undressing Lower body dressing   What is the patient wearing?: Pants, Socks, Shoes   Underwear - Performed by helper: Thread/unthread right underwear leg, Thread/unthread left underwear leg, Pull underwear up/down Pants- Performed by patient: Thread/unthread right pants leg, Pull pants up/down Pants- Performed by helper: Thread/unthread left pants leg   Non-skid slipper socks- Performed by helper: Don/doff right sock,  Don/doff left sock   Socks - Performed by helper: Don/doff right sock, Don/doff left sock Shoes - Performed by patient: Don/doff right shoe, Fasten right Shoes - Performed by helper: Don/doff left shoe, Fasten left          Lower body assist Assist for lower body dressing: Touching or steadying assistance (Pt > 75%)      Toileting Toileting Toileting activity did not occur: No continent  bowel/bladder event Toileting steps completed by patient: Adjust clothing prior to toileting, Adjust clothing after toileting Toileting steps completed by helper: Performs perineal hygiene Toileting Assistive Devices: Grab bar or rail  Toileting assist Assist level: Touching or steadying assistance (Pt.75%)   Transfers Chair/bed transfer   Chair/bed transfer method: Stand pivot Chair/bed transfer assist level: Touching or steadying assistance (Pt > 75%) Chair/bed transfer assistive device: Armrests Mechanical lift: Stedy   Locomotion Ambulation     Max distance: 30' Assist level: Moderate assist (Pt 50 - 74%)   Wheelchair   Type: Manual Max wheelchair distance: 150' Assist Level: Supervision or verbal cues  Cognition Comprehension Comprehension assist level: Follows basic conversation/direction with extra time/assistive device  Expression Expression assist level: Expresses basic needs/ideas: With no assist  Social Interaction Social Interaction assist level: Interacts appropriately 90% of the time - Needs monitoring or encouragement for participation or interaction.  Problem Solving Problem solving assist level: Solves basic problems with no assist  Memory Memory assist level: Complete Independence: No helper    Medical Problem List and Plan:  1. 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 Aspirin and Plavix x3 weeks then aspirin alone off Plavix  6/20  Cont CIR  WHO/PRAFO  2. DVT Prophylaxis/Anticoagulation: Subcutaneous Lovenox  3. Pain Management: Tylenol as needed  4. Mood: Provide emotional support  5. Neuropsych: This patient is capable of making decisions on his own behalf.  6. Skin/Wound Care: Routine skin checks  7. Fluids/Electrolytes/Nutrition: Routine in and outs   BMP within acceptable range on 6/28, however BUN/Cr creatinine trending up  Encourage fluids 8. CAD with CABG. Continue  aspirin and Plavix. No chest pain or shortness of breath  9. Diabetes mellitus. Hemoglobin A1c 6.0. SSI. Check blood sugars before meals and at bedtime   Home meds Glucophage 1000 mg twice daily, Amaryl 4 mg daily, Lantus insulin 10 units nightly, Tradjenta 5 mg daily  CBG (last 3)  Recent Labs    09/07/17 2157 09/08/17 0635 09/08/17 1148  GLUCAP 119* 125* 178*   Tradjenta 5mg , Metformin 500mg  BID  Labile on 6/28 10. Dysphagia. Dysphasia #2 nectar liquids. Follow-up speech therapy  11. Hypertension. Cozaar 25 mg daily, Toprol 50 mg daily  Vitals:   09/07/17 1935 09/08/17 0528  BP: (!) 126/57 123/67  Pulse: 71 75  Resp: 18 18  Temp: 98.5 F (36.9 C) 99 F (37.2 C)  SpO2: 96% 97%   Relatively controlled on 6/28 12. Diastolic congestive heart failure. Monitor for any signs of fluid overload. Patient was taking Lasix 40 mg daily if needed for fluid retention prior to admission.   No SOB, restarted low dose Lasix 10mg , increased to 20mg     Filed Weights   09/06/17 0432 09/07/17 0547 09/08/17 0528  Weight: 79.4 kg (175 lb) 78.9 kg (174 lb) 81.2 kg (179 lb 0.2 oz)   ? Reliability 13. History of seizure disorder. Depakote 500 mg daily.  14. Hyperlipidemia. Lipitor  15. History of alcohol use. Monitor for withdrawal  16.  Elevated Creat resolved, remains normal on Metformin 17.  Constipation-improved, reduced senna to 1po BID, continent of stool continue Bisacodyl supp prn 18. Knee OA pain  Multifactorial - OA + gout  X-ray is reviewed 04/2017 showing OA  History of gout with elevated uric acid level.  Colchicine started on 6/25  Voltaren gel  Improving 19. Acute blood loss anemia  Hemoglobin 12.8 on 6/28  Continue to monitor   LOS (Days) 28 A FACE TO FACE EVALUATION WAS PERFORMED  Ankit Lorie Phenix 09/08/2017 12:55 PM

## 2017-09-09 ENCOUNTER — Inpatient Hospital Stay (HOSPITAL_COMMUNITY): Payer: Medicare Other | Admitting: Physical Therapy

## 2017-09-09 ENCOUNTER — Inpatient Hospital Stay (HOSPITAL_COMMUNITY): Payer: Medicare Other | Admitting: Occupational Therapy

## 2017-09-09 LAB — GLUCOSE, CAPILLARY
GLUCOSE-CAPILLARY: 114 mg/dL — AB (ref 70–99)
GLUCOSE-CAPILLARY: 152 mg/dL — AB (ref 70–99)
Glucose-Capillary: 173 mg/dL — ABNORMAL HIGH (ref 70–99)
Glucose-Capillary: 126 mg/dL — ABNORMAL HIGH (ref 70–99)

## 2017-09-09 NOTE — Progress Notes (Signed)
Asked pt if wanted to get cleaned up.  Fred [name] that has already been cleaned.  He reports that bathes every day.  Informed that communication would be brought back to nursing is bath not completed during day.  Said no that he feels he is very well taken care of.  Will continue to monitor.

## 2017-09-09 NOTE — Progress Notes (Signed)
Daughter upset that got call from grand daughter. Reports that pt not getting bathed. Informed that baths are offered daily, shower is safety issue. Informed that pt may not have bath first thing in morning that may happen later in day.  PT aware and will communicate with OT.

## 2017-09-09 NOTE — Progress Notes (Signed)
[facility] PHYSICAL MEDICINE & REHABILITATION     PROGRESS NOTE  Subjective/Complaints:  Patient seen lying in bed this morning. He states he slept well overnight. He denies complaints.  ROS: denies CP, SOB, nausea, vomiting, diarrhea  Objective: Vital Signs: Blood pressure 127/72, pulse 77, temperature 98.4 F (36.9 C), temperature source Oral, resp. rate 18, height 5\' 3"  (1.6 m), weight 80.2 kg (176 lb 12.9 oz), SpO2 98 %. No results found. Recent Labs    09/08/17 0501  WBC 5.0  HGB 12.8*  HCT 39.0  PLT 233   Recent Labs    09/08/17 0501  NA 141  K 3.7  CL 105  GLUCOSE 125*  BUN 29*  CREATININE 1.13  CALCIUM 9.0   CBG (last 3)  Recent Labs    09/08/17 1651 09/08/17 2120 09/09/17 0638  GLUCAP 141* 131* 114*    Wt Readings from Last 3 Encounters:  09/09/17 80.2 kg (176 lb 12.9 oz)  08/10/17 84.4 kg (186 lb)  07/18/17 87.1 kg (192 lb)    Physical Exam:  BP 127/72 (BP Location: Left Arm)   Pulse 77   Temp 98.4 F (36.9 C) (Oral)   Resp 18   Ht 5\' 3"  (1.6 m)   Wt 80.2 kg (176 lb 12.9 oz)   SpO2 98%   BMI 31.32 kg/m  Constitutional: No distress . Vital signs reviewed. HENT: Normocephalic.  Atraumatic. Eyes: EOMI. No discharge. Cardiovascular: RRR. No JVD. Respiratory: CTA Bilaterally. Normal effort. GI: BS +. Non-distended. Musc: Right knee with edema and tenderness resolved Neurological: He is alert and oriented Follows full commands.  Fair awareness of deficits.  Motor: LUE: shoulder abduction, elbow flexion/extension 3/5, hand grip 2+/5 . Increase in tone LLE: hip flexion 2/5, distally trace (unchanged) Skin: Skin is warm and dry.  Psychiatric: He has a normal mood and affect. His behavior is normal.   Assessment/Plan: 1. 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 Physiatrist is providing close team  supervision and 24 hour management of active medical problems listed below. Physiatrist and rehab team continue to assess barriers to discharge/monitor patient progress toward functional and medical goals.  Function:  Bathing Bathing position   Position: Shower  Bathing parts Body parts bathed by patient: Chest, Abdomen, Right upper leg, Left upper leg, Front perineal area, Left arm, Right lower leg Body parts bathed by helper: Buttocks, Left arm  Bathing assist Assist Level: Touching or steadying assistance(Pt > 75%), Assistive device Assistive Device Comment: LH sponge    Upper Body Dressing/Undressing Upper body dressing   What is the patient wearing?: Pull over shirt/dress     Pull over shirt/dress - Perfomed by patient: Thread/unthread left sleeve, Thread/unthread right sleeve, Put head through opening, Pull shirt over trunk Pull over shirt/dress - Perfomed by helper: Pull shirt over trunk        Upper body assist Assist Level: Supervision or verbal cues, Set up   Set up : To obtain clothing/put away  Lower Body Dressing/Undressing Lower body dressing   What is the patient wearing?: Pants, Socks, Shoes   Underwear - Performed by helper: Thread/unthread right underwear leg, Thread/unthread left underwear leg, Pull underwear up/down Pants- Performed by patient: Thread/unthread right pants leg, Pull pants up/down Pants- Performed by helper: Thread/unthread left pants leg   Non-skid slipper socks- Performed by helper: Don/doff right sock, Don/doff left sock   Socks -  Performed by helper: Don/doff right sock, Don/doff left sock Shoes - Performed by patient: Don/doff right shoe, Fasten right Shoes - Performed by helper: Don/doff left shoe, Fasten left          Lower body assist Assist for lower body dressing: Touching or steadying assistance (Pt > 75%)      Toileting Toileting Toileting activity did not occur: No continent bowel/bladder event Toileting steps completed  by patient: Adjust clothing prior to toileting, Adjust clothing after toileting Toileting steps completed by helper: Performs perineal hygiene, Adjust clothing prior to toileting, Adjust clothing after toileting Toileting Assistive Devices: Grab bar or rail  Toileting assist Assist level: Two helpers   Transfers Chair/bed transfer   Chair/bed transfer [place]: Stand pivot Chair/bed transfer assist level: Touching or steadying assistance (Pt > 75%) Chair/bed transfer assistive device: Armrests, Other(hemiwalker) Mechanical lift: Stedy   Locomotion Ambulation     Max distance: 30' Assist level: Moderate assist (Pt 50 - 74%)   Wheelchair   Type: Manual Max wheelchair distance: 150' Assist Level: Supervision or verbal cues  Cognition Comprehension Comprehension assist level: Follows basic conversation/direction with extra time/assistive device  Expression Expression assist level: Expresses basic needs/ideas: With no assist  Social Interaction Social Interaction assist level: Interacts appropriately 90% of the time - Needs monitoring or encouragement for participation or interaction.  Problem Solving Problem solving assist level: Solves basic problems with no assist  Memory Memory assist level: Complete Independence: No helper    Medical Problem List and Plan:  1. 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 Aspirin and Plavix x3 weeks then aspirin alone off Plavix  6/20  Cont CIR  WHO/PRAFO  2. DVT Prophylaxis/Anticoagulation: Subcutaneous Lovenox  3. Pain Management: Tylenol as needed  4. Mood: Provide emotional support  5. Neuropsych: This patient is capable of making decisions on his own behalf.  6. Skin/Wound Care: Routine skin checks  7. Fluids/Electrolytes/Nutrition: Routine in and outs   BMP within acceptable range on 6/28, however BUN/Cr creatinine trending up  Encourage fluids 8. CAD with CABG.  Continue aspirin and Plavix. No chest pain or shortness of breath  9. Diabetes mellitus. Hemoglobin A1c 6.0. SSI. Check blood sugars before meals and at bedtime   Home meds Glucophage 1000 mg twice daily, Amaryl 4 mg daily, Lantus insulin 10 units nightly, Tradjenta 5 mg daily  CBG (last 3)  Recent Labs    09/08/17 1651 09/08/17 2120 09/09/17 0638  GLUCAP 141* 131* 114*   Tradjenta 5mg , Metformin 500mg  BID  Labile on 6/29, but overall controlled 10. Dysphagia. Dysphasia #2 nectar liquids. Follow-up speech therapy  11. Hypertension. Cozaar 25 mg daily, Toprol 50 mg daily  Vitals:   09/08/17 1433 09/09/17 0633  BP: (!) 95/53 127/72  Pulse: 79 77  Resp: 18 18  Temp: 99.6 F (37.6 C) 98.4 F (36.9 C)  SpO2: 93% 98%   Labile on 6/29 12. Diastolic congestive heart failure. Monitor for any signs of fluid overload. Patient was taking Lasix 40 mg daily if needed for fluid retention prior to admission.   No SOB, restarted low dose Lasix 10mg , increased to 20mg     Filed Weights   09/07/17 0547 09/08/17 0528 09/09/17 0633  Weight: 78.9 kg (174 lb) 81.2 kg (179 lb 0.2 oz) 80.2 kg (176 lb 12.9 oz)   ? Reliability 13. History of seizure disorder. Depakote 500 mg daily.  14. Hyperlipidemia. Lipitor  15. History of alcohol use. Monitor  for withdrawal  16.  Elevated Creat resolved, remains normal on Metformin 17.  Constipation-improved, reduced senna to 1po BID, continent of stool continue Bisacodyl supp prn 18. Knee OA pain: Resolved  Multifactorial - OA + gout  X-ray is reviewed 04/2017 showing OA  History of gout with elevated uric acid level.  Colchicine started on 6/25  Voltaren gel 19. Acute blood loss anemia  Hemoglobin 12.8 on 6/28  Continue to monitor   LOS (Days) 29 A FACE TO FACE EVALUATION WAS PERFORMED  Ankit Lorie Phenix 09/09/2017 7:38 AM

## 2017-09-09 NOTE — Progress Notes (Signed)
Occupational Therapy Session Note  Patient Details  Name: Fred [name] MRN: [id number] Date of Birth: March 02, 1936  Today's Date: 09/09/2017 OT Group Time: 1100-1200 OT Group Time Calculation (min): 60 min   Skilled Therapeutic Interventions/Progress Updates:    Pt participated in therapeutic w/c level dance group with focus on UE/LE strengthening, activity tolerance, and social participation for carryover during self care tasks. Pt was guided through various dance-based exercises involving UB/LB and trunk. Adaptations were made for incorporating AAROM L UE/LE. Spouse was present, providing sensory input to Lt hand and gently moving limb in IR/ER with min vcs for joint protection. He required manual cues for initiation/sustained engagement from therapist and family. Often smiling during session. At end of tx he was escorted back to room with family.    Therapy Documentation Precautions:  Precautions Precautions: Fall Precaution Comments: L sided weakness Restrictions Weight Bearing Restrictions: No ADL: ADL ADL Comments: see functional navigator     See Function Navigator for Current Functional Status.   Therapy/Group: Group Therapy  Fred [name] A Nikky Duba 09/09/2017, 12:30 PM

## 2017-09-09 NOTE — Progress Notes (Addendum)
Physical Therapy Session Note  Patient Details  Name: Fred [name] MRN: [id number] Date of Birth: 02/22/1936  Today's Date: 09/09/2017 PT Individual Time: 1828-8337 PT Individual Time Calculation (min): 45 min   Short Term Goals: Week 4:  PT Short Term Goal 1 (Week 4): =LTGs due to ELOS  Skilled Therapeutic Interventions/Progress Updates:   Pt in w/c and agreeable to therapy, denies pain. Granddaughter Nurse, adult) present throughout session. Total assist w/c transport to/from day room for time management. Session focused on gait and transfers. Ambulated at rail w/o LAFO and lighter shoes for improved foot clearance and to specifically work on activating L hip flexion during L swing limb advancement. Ambulated 30' and 15'x2 w/ min-mod assist. Verbal and manual cues for lateral weight shifting, pt able to initiate L hip flexion for pre-swing but required max-total assist for step placement and to extend L knee out for weight acceptance. Ambulated 20' w/ hemiwalker w/ same assist as described above. Much improved ability to laterally weight shift w/ hemiwalker this session, decreased lateropulsion allowing for improved hemiwalker management. Provided continued education on stand pivot transfers w/ granddaughter. Pt performed multiple in each direction w/ min assist. No hemiwalker assist when going to R and hemiwalker assist when transferring to L. She returned demonstration of technique safely. Returned to room and assisted granddaughter in setting pt up at sink for w/c level bathing. Total assist to manage LE garments and min assist to remove shirt. Ended session sitting at w/c at sink, in care of granddaughter. Granddaughter agreeable to call for assistance to stand w/ pt when finished, NT made aware as well. All needs met.   Therapy Documentation Precautions:  Precautions Precautions: Fall Precaution Comments: L sided weakness Restrictions Weight Bearing Restrictions: No Pain: Pain  Assessment Pain Score: 0-No pain  See Function Navigator for Current Functional Status.   Therapy/Group: Individual Therapy  [name] 09/09/2017, 12:07 PM

## 2017-09-10 ENCOUNTER — Inpatient Hospital Stay (HOSPITAL_COMMUNITY): Payer: Medicare Other

## 2017-09-10 LAB — GLUCOSE, CAPILLARY
Glucose-Capillary: 109 mg/dL — ABNORMAL HIGH (ref 70–99)
Glucose-Capillary: 195 mg/dL — ABNORMAL HIGH (ref 70–99)
Glucose-Capillary: 142 mg/dL — ABNORMAL HIGH (ref 70–99)
Glucose-Capillary: 131 mg/dL — ABNORMAL HIGH (ref 70–99)

## 2017-09-10 NOTE — Progress Notes (Signed)
Physical Therapy Session Note  Patient Details  Name: Fred [name] MRN: [id number] Date of Birth: 1935/09/17  Today's Date: 09/10/2017 PT Individual Time: 1540-1635 PT Individual Time Calculation (min): 55 min   Short Term Goals: Week 3:  PT Short Term Goal 1 (Week 3): =LTGs due to ELOS  Skilled Therapeutic Interventions/Progress Updates:    Pt seated in w/c upon PT arrival, agreeable to therapy tx and denies pain. Pt propelled w/c from room>gym with supervision x 150 ft. Pt performed stand pivot transfer from w/c>mat with min assist towards the R, verbal cues for techniques. Pt performed sit<>stand from edge of mat with min assist and use of hemi walker, in standing pt worked on L LE stance control while stepping with R LE. Pt unable to advance L LE to step without assist. Pt performed blocked practice of stand pivot transfers with hemiwalker and min assist x 3 in each direction, verbal cues for techniques and manual facilitation for set up. Pt worked on dynamic standing balance without UE support to toss horse shoes, x 2 trials, verbal and tactile cues to minimize/correct L lateral lean. Pt worked on L LE quadriceps activation in order to kick ball back and forth while seated edge of mat x 10 kicks. Pt worked on Office manager, emphasis on L lateral weightshift in order to [employer] R LE to initiate step, therapist providing assist to advance R LE, x 10 steps in place. Pt performed stand pivot back to w/c with min assist and transported back to room, left seated with needs in reach and wife present.   Therapy Documentation Precautions:  Precautions Precautions: Fall Precaution Comments: L sided weakness Restrictions Weight Bearing Restrictions: No   See Function Navigator for Current Functional Status.   Therapy/Group: Individual Therapy  Netta Corrigan, PT, DPT 09/10/2017, 7:55 AM

## 2017-09-10 NOTE — Progress Notes (Signed)
PHYSICAL MEDICINE & REHABILITATION     PROGRESS NOTE  Subjective/Complaints:  Patient seen lying in bed this morning. He states he slept well overnight. He is in good spirits.  ROS: Denies CP, SOB, nausea, vomiting, diarrhea  Objective: Vital Signs: Blood pressure (!) 143/67, pulse 75, temperature 97.8 F (36.6 C), temperature source Oral, resp. rate 18, height 5\' 3"  (1.6 m), weight 80.9 kg (178 lb 5.6 oz), SpO2 98 %. No results found. Recent Labs    09/08/17 0501  WBC 5.0  HGB 12.8*  HCT 39.0  PLT 233   Recent Labs    09/08/17 0501  NA 141  K 3.7  CL 105  GLUCOSE 125*  BUN 29*  CREATININE 1.13  CALCIUM 9.0   CBG (last 3)  Recent Labs    09/09/17 1641 09/09/17 2138 09/10/17 0623  GLUCAP 173* 126* 109*    Wt Readings from Last 3 Encounters:  09/10/17 80.9 kg (178 lb 5.6 oz)  08/10/17 84.4 kg (186 lb)  07/18/17 87.1 kg (192 lb)    Physical Exam:  BP (!) 143/67 (BP Location: Right Arm)   Pulse 75   Temp 97.8 F (36.6 C) (Oral)   Resp 18   Ht 5\' 3"  (1.6 m)   Wt 80.9 kg (178 lb 5.6 oz)   SpO2 98%   BMI 31.59 kg/m  Constitutional: No distress . Vital signs reviewed. HENT: Normocephalic.  Atraumatic. Eyes: EOMI. No discharge. Cardiovascular: RRR. No JVD. Respiratory: CTA Bilaterally. Normal effort. GI: BS +. Non-distended. Musc: Right knee with edema and tenderness resolved Neurological: He is alert and oriented Follows full commands.  Fair awareness of deficits.  Motor: LUE: shoulder abduction, elbow flexion/extension 3/5, hand grip 2+/5 . Increase in tone LLE: hip flexion 2/5, distally trace (stable) Skin: Skin is warm and dry.  Psychiatric: He has a normal mood and affect. His behavior is normal.   Assessment/Plan: 1. 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 Physiatrist is providing close  team supervision and 24 hour management of active medical problems listed below. Physiatrist and rehab team continue to assess barriers to discharge/monitor patient progress toward functional and medical goals.  Function:  Bathing Bathing position   Position: Shower  Bathing parts Body parts bathed by patient: Chest, Abdomen, Right upper leg, Left upper leg, Front perineal area, Left arm, Right lower leg Body parts bathed by helper: Buttocks, Left arm  Bathing assist Assist Level: Touching or steadying assistance(Pt > 75%), Assistive device Assistive Device Comment: LH sponge    Upper Body Dressing/Undressing Upper body dressing   What is the patient wearing?: Pull over shirt/dress     Pull over shirt/dress - Perfomed by patient: Thread/unthread left sleeve, Thread/unthread right sleeve, Put head through opening, Pull shirt over trunk Pull over shirt/dress - Perfomed by helper: Pull shirt over trunk        Upper body assist Assist Level: Supervision or verbal cues, Set up   Set up : To obtain clothing/put away  Lower Body Dressing/Undressing Lower body dressing   What is the patient wearing?: Pants, Socks, Shoes   Underwear - Performed by helper: Thread/unthread right underwear leg, Thread/unthread left underwear leg, Pull underwear up/down Pants- Performed by patient: Thread/unthread right pants leg, Pull pants up/down Pants- Performed by helper: Thread/unthread left pants leg   Non-skid slipper socks- Performed by helper: Don/doff right sock, Don/doff left sock  Socks - Performed by helper: Don/doff right sock, Don/doff left sock Shoes - Performed by patient: Don/doff right shoe, Fasten right Shoes - Performed by helper: Don/doff left shoe, Fasten left          Lower body assist Assist for lower body dressing: Touching or steadying assistance (Pt > 75%)      Toileting Toileting Toileting activity did not occur: No continent bowel/bladder event Toileting steps  completed by patient: Adjust clothing prior to toileting, Adjust clothing after toileting Toileting steps completed by helper: Adjust clothing prior to toileting, Performs perineal hygiene, Adjust clothing after toileting Toileting Assistive Devices: Grab bar or rail  Toileting assist Assist level: Two helpers   Transfers Chair/bed transfer   Chair/bed transfer [place]: Stand pivot Chair/bed transfer assist level: Touching or steadying assistance (Pt > 75%) Chair/bed transfer assistive device: Armrests, Other(hemiwalker) Mechanical lift: Stedy   Locomotion Ambulation     Max distance: 30' Assist level: Moderate assist (Pt 50 - 74%)   Wheelchair   Type: Manual Max wheelchair distance: 150' Assist Level: Supervision or verbal cues  Cognition Comprehension Comprehension assist level: Understands basic 90% of the time/cues < 10% of the time  Expression Expression assist level: Expresses basic 90% of the time/requires cueing < 10% of the time.  Social Interaction Social Interaction assist level: Interacts appropriately 90% of the time - Needs monitoring or encouragement for participation or interaction.  Problem Solving Problem solving assist level: Solves basic 90% of the time/requires cueing < 10% of the time  Memory Memory assist level: Recognizes or recalls 90% of the time/requires cueing < 10% of the time    Medical Problem List and Plan:  1. 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 Aspirin and Plavix x3 weeks then aspirin alone off Plavix  6/20  Cont CIR  WHO/PRAFO  2. DVT Prophylaxis/Anticoagulation: Subcutaneous Lovenox  3. Pain Management: Tylenol as needed  4. Mood: Provide emotional support  5. Neuropsych: This patient is capable of making decisions on his own behalf.  6. Skin/Wound Care: Routine skin checks  7. Fluids/Electrolytes/Nutrition: Routine in and outs   BMP within acceptable range  on 6/28, however BUN/Cr creatinine trending up  Encourage fluids  Labs ordered for tomorrow 8. CAD with CABG. Continue aspirin and Plavix. No chest pain or shortness of breath  9. Diabetes mellitus. Hemoglobin A1c 6.0. SSI. Check blood sugars before meals and at bedtime   Home meds Glucophage 1000 mg twice daily, Amaryl 4 mg daily, Lantus insulin 10 units nightly, Tradjenta 5 mg daily  CBG (last 3)  Recent Labs    09/09/17 1641 09/09/17 2138 09/10/17 0623  GLUCAP 173* 126* 109*   Tradjenta 5mg , Metformin 500mg  BID  Slightly labile, but overall controlled on 6/30 10. Dysphagia. Dysphasia #2 nectar liquids. Follow-up speech therapy  11. Hypertension. Cozaar 25 mg daily, Toprol 50 mg daily  Vitals:   09/09/17 1942 09/10/17 0441  BP: (!) 152/66 (!) 143/67  Pulse: 75 75  Resp: 16 18  Temp: 97.9 F (36.6 C) 97.8 F (36.6 C)  SpO2: 100% 98%   Labile on 6/30 12. Diastolic congestive heart failure. Monitor for any signs of fluid overload. Patient was taking Lasix 40 mg daily if needed for fluid retention prior to admission.   No SOB, restarted low dose Lasix 10mg , increased to 20mg     Filed Weights   09/08/17 0528 09/09/17 0633 09/10/17 0439  Weight: 81.2 kg (179 lb 0.2 oz)  80.2 kg (176 lb 12.9 oz) 80.9 kg (178 lb 5.6 oz)   ? Reliability 13. History of seizure disorder. Depakote 500 mg daily.  14. Hyperlipidemia. Lipitor  15. History of alcohol use. Monitor for withdrawal  16.  Elevated Creat resolved, remains normal on Metformin 17.  Constipation-improved, reduced senna to 1po BID, continent of stool continue Bisacodyl supp prn 18. Knee OA pain: Resolved  Multifactorial - OA + gout  X-ray is reviewed 04/2017 showing OA  History of gout with elevated uric acid level.  Colchicine started on 6/25  Voltaren gel 19. Acute blood loss anemia  Hemoglobin 12.8 on 6/28  Continue to monitor   LOS (Days) 30 A FACE TO FACE EVALUATION WAS PERFORMED  Candy Ziegler Lorie Phenix 09/10/2017 7:42 AM

## 2017-09-11 ENCOUNTER — Inpatient Hospital Stay (HOSPITAL_COMMUNITY): Payer: Medicare Other

## 2017-09-11 ENCOUNTER — Inpatient Hospital Stay (HOSPITAL_COMMUNITY): Payer: Medicare Other | Admitting: Occupational Therapy

## 2017-09-11 LAB — GLUCOSE, CAPILLARY
GLUCOSE-CAPILLARY: 152 mg/dL — AB (ref 70–99)
Glucose-Capillary: 118 mg/dL — ABNORMAL HIGH (ref 70–99)
Glucose-Capillary: 128 mg/dL — ABNORMAL HIGH (ref 70–99)
Glucose-Capillary: 179 mg/dL — ABNORMAL HIGH (ref 70–99)

## 2017-09-11 LAB — BASIC METABOLIC PANEL
Anion gap: 6 (ref 5–15)
BUN: 20 mg/dL (ref 8–23)
CALCIUM: 9.1 mg/dL (ref 8.9–10.3)
CO2: 29 mmol/L (ref 22–32)
Chloride: 106 mmol/L (ref 98–111)
Creatinine, Ser: 1.02 mg/dL (ref 0.61–1.24)
GFR calc Af Amer: >60 mL/min (ref >60)
GLUCOSE: 123 mg/dL — AB (ref 70–99)
Potassium: 3.6 mmol/L (ref 3.5–5.1)
Sodium: 141 mmol/L (ref 135–145)

## 2017-09-11 NOTE — Progress Notes (Signed)
[facility] PHYSICAL [facility]ICINE & REHABILITATION     PROGRESS NOTE  Subjective/Complaints:   No issues overnite, discussed BP and CBGs and D/C date, f/u therapy  ROS: Denies CP, SOB, nausea, vomiting, diarrhea  Objective: Vital Signs: Blood pressure 132/69, pulse 74, temperature 97.8 F (36.6 C), resp. rate 18, height 5\' 3"  (1.6 m), weight 80.7 kg (177 lb 14.6 oz), SpO2 98 %. No results found. No results for input(s): WBC, HGB, HCT, PLT in the last 72 hours. Recent Labs    09/11/17 0624  NA 141  K 3.6  CL 106  GLUCOSE 123*  BUN 20  CREATININE 1.02  CALCIUM 9.1   CBG (last 3)  Recent Labs    09/10/17 1640 09/10/17 2131 09/11/17 0637  GLUCAP 142* 131* 118*    Wt Readings from Last 3 Encounters:  09/11/17 80.7 kg (177 lb 14.6 oz)  08/10/17 84.4 kg (186 lb)  07/18/17 87.1 kg (192 lb)    Physical Exam:  BP 132/69 (BP Location: Right Arm)   Pulse 74   Temp 97.8 F (36.6 C)   Resp 18   Ht 5\' 3"  (1.6 m)   Wt 80.7 kg (177 lb 14.6 oz)   SpO2 98%   BMI 31.52 kg/m  Constitutional: No distress . Vital signs reviewed. HENT: Normocephalic.  Atraumatic. Eyes: EOMI. No discharge. Cardiovascular: RRR. No JVD. Respiratory: CTA Bilaterally. Normal effort. GI: BS +. Non-distended. Musc: Right knee with edema and tenderness resolved Neurological: He is alert and oriented Follows full commands.  Fair awareness of deficits.  Motor: LUE: shoulder abduction, elbow flexion/extension 3/5, hand grip 2+/5 . Increase in tone LLE: hip flexion 2/5, distally trace (stable) Skin: Skin is warm and dry.  Psychiatric: He has a normal mood and affect. His behavior is normal.   Assessment/Plan: 1. 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 Physiatrist is providing close team supervision and 24 hour management of active medical problems listed  below. Physiatrist and rehab team continue to assess barriers to discharge/monitor patient progress toward functional and medical goals.  Function:  Bathing Bathing position   Position: Shower  Bathing parts Body parts bathed by patient: Chest, Abdomen, Right upper leg, Left upper leg, Front perineal area, Left arm, Right lower leg Body parts bathed by helper: Buttocks, Left arm  Bathing assist Assist Level: Touching or steadying assistance(Pt > 75%), Assistive device Assistive Device Comment: LH sponge    Upper Body Dressing/Undressing Upper body dressing   What is the patient wearing?: Pull over shirt/dress     Pull over shirt/dress - Perfomed by patient: Thread/unthread left sleeve, Thread/unthread right sleeve, Put head through opening, Pull shirt over trunk Pull over shirt/dress - Perfomed by helper: Pull shirt over trunk        Upper body assist Assist Level: Supervision or verbal cues, Set up   Set up : To obtain clothing/put away  Lower Body Dressing/Undressing Lower body dressing   What is the patient wearing?: Pants, Socks, Shoes   Underwear - Performed by helper: Thread/unthread right underwear leg, Thread/unthread left underwear leg, Pull underwear up/down Pants- Performed by patient: Thread/unthread right pants leg, Pull pants up/down Pants- Performed by helper: Thread/unthread left pants leg   Non-skid slipper socks- Performed by helper: Don/doff right sock, Don/doff left sock   Socks - Performed by helper: Don/doff right sock, Don/doff left sock Shoes - Performed by patient: Don/doff  right shoe, Fasten right Shoes - Performed by helper: Don/doff left shoe, Fasten left          Lower body assist Assist for lower body dressing: Touching or steadying assistance (Pt > 75%)      Toileting Toileting Toileting activity did not occur: No continent bowel/bladder event Toileting steps completed by patient: Adjust clothing prior to toileting, Performs perineal  hygiene, Adjust clothing after toileting Toileting steps completed by helper: Adjust clothing prior to toileting, Performs perineal hygiene, Adjust clothing after toileting Toileting Assistive Devices: Grab bar or rail  Toileting assist Assist level: Two helpers   Transfers Chair/bed transfer   Chair/bed transfer [place]: Stand pivot Chair/bed transfer assist level: Touching or steadying assistance (Pt > 75%) Chair/bed transfer assistive device: Armrests, Other(hemiwalker) Mechanical lift: Stedy   Locomotion Ambulation     Max distance: 30' Assist level: Moderate assist (Pt 50 - 74%)   Wheelchair   Type: Manual Max wheelchair distance: 150' Assist Level: Supervision or verbal cues  Cognition Comprehension Comprehension assist level: Understands basic 90% of the time/cues < 10% of the time  Expression Expression assist level: Expresses basic 90% of the time/requires cueing < 10% of the time.  Social Interaction Social Interaction assist level: Interacts appropriately 90% of the time - Needs monitoring or encouragement for participation or interaction.  Problem Solving Problem solving assist level: Solves basic 90% of the time/requires cueing < 10% of the time  Memory Memory assist level: Recognizes or recalls 90% of the time/requires cueing < 10% of the time    Medical Problem List and Plan:  1. 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 Aspirin and Plavix x3 weeks then aspirin alone off Plavix  6/20  Cont CIR  WHO/PRAFO  2. DVT Prophylaxis/Anticoagulation: Subcutaneous Lovenox  3. Pain Management: Tylenol as needed  4. Mood: Provide emotional support  5. Neuropsych: This patient is capable of making decisions on his own behalf.  6. Skin/Wound Care: Routine skin checks  7. Fluids/Electrolytes/Nutrition: Routine in and outs   BMP within acceptable range on 6/28, however BUN/Cr creatinine trending  up  Encourage fluids  Labs ordered for tomorrow 8. CAD with CABG. Continue aspirin and Plavix. No chest pain or shortness of breath  9. Diabetes mellitus. Hemoglobin A1c 6.0. SSI. Check blood sugars before meals and at bedtime   Home meds Glucophage 1000 mg twice daily, Amaryl 4 mg daily, Lantus insulin 10 units nightly, Tradjenta 5 mg daily  CBG (last 3)  Recent Labs    09/10/17 1640 09/10/17 2131 09/11/17 0637  GLUCAP 142* 131* 118*   Tradjenta 5mg , Metformin 500mg  BID  controlled on 7/1 10. Dysphagia. Dysphasia #2 nectar liquids. Follow-up speech therapy  11. Hypertension. Cozaar 25 mg daily, Toprol 50 mg daily  Vitals:   09/10/17 2038 09/11/17 0535  BP: 138/63 132/69  Pulse: 75 74  Resp: 16 18  Temp: 98.6 F (37 C) 97.8 F (36.6 C)  SpO2: 98% 98%   Controlled 7/1 12. Diastolic congestive heart failure. Monitor for any signs of fluid overload. Patient was taking Lasix 40 mg daily if needed for fluid retention prior to admission.   No SOB, restarted low dose Lasix 10mg , increased to 20mg     Filed Weights   09/09/17 0633 09/10/17 0439 09/11/17 0535  Weight: 80.2 kg (176 lb 12.9 oz) 80.9 kg (178 lb 5.6 oz) 80.7 kg (177 lb 14.6 oz)   wts stable 13. History of seizure disorder.  Depakote 500 mg daily.  14. Hyperlipidemia. Lipitor  15. History of alcohol use. Monitor for withdrawal  16.  Elevated Creat resolved, remains normal on Metformin 17.  Constipation-improved, reduced senna to 1po BID, continent of stool continue Bisacodyl supp prn 18. Knee OA pain: Resolved  Multifactorial - OA + gout  X-ray is reviewed 04/2017 showing OA  History of gout with elevated uric acid level.  Colchicine started on 6/25  Voltaren gel 19. Anemia resolved  Hemoglobin 12.8 on 6/28  Continue to monitor   LOS (Days) 31 A FACE TO FACE EVALUATION WAS PERFORMED  Charlett Blake 09/11/2017 7:24 AM

## 2017-09-11 NOTE — Progress Notes (Addendum)
Occupational Therapy Session Note  Patient Details  Name: Fred [name] MRN: [id number] Date of Birth: May 25, 1935  Today's Date: 09/11/2017 OT Individual Time: 1000-1100 OT Individual Time Calculation (min): 60 min    Short Term Goals: Week 4:  OT Short Term Goal 1 (Week 4): STG=LTG 2/2 ELOS  Skilled Therapeutic Interventions/Progress Updates:    Pt seen for OT session focusing on caregiver training. Pt sitting up in w/c upon arrival with daughter, son-in-law, wife, and grandson present. Pt greeable to tx session and hans on family training. Discussed at length, d/c planning, pt's CLOF, DME, home set-up ,continuum of care, activity progression, etc. Obtined drop arm BSC and completed squat pivot transfers w/c<> BSC.  Therapist provided mod A for transfer requiring several attempts to make it completely to location due to far distance btwn surfaces. Pt's daughter assisted with sit<>stand with hemi walker in simulation of clothing management. Plan for pt to stand with hemi waker while caregivers provide total A for toileting. Completed x4 sit>stand from [facility], multimodal cuing provided by therapist to pt and caregiver, caregiver providing min A for standing balance. Therapist assisted with mod A squat pivot transfer back to w/c. Family members provided education on technique for reciprocal scooting and able to assist py back into w/c for proper positioning in chair. Pt left seated in w/c at end of session with hand off to PT.   Therapy Documentation Precautions:  Precautions Precautions: Fall Precaution Comments: L sided weakness Restrictions Weight Bearing Restrictions: No Pain:   No/denies pain ADL: ADL ADL Comments: see functional navigator  See Function Navigator for Current Functional Status.   Therapy/Group: Individual Therapy  Helaine Yackel L 09/11/2017, 7:11 AM

## 2017-09-11 NOTE — Progress Notes (Signed)
Physical Therapy Session Note  Patient Details  Name: Fred [name] MRN: [id number] Date of Birth: 1935/07/08  Today's Date: 09/11/2017 PT Individual Time: 1100-1158, 1400-1510 PT Individual Time Calculation (min): 58 min and 70 min   Short Term Goals: Week 3:  PT Short Term Goal 1 (Week 3): =LTGs due to ELOS  Skilled Therapeutic Interventions/Progress Updates:    Session 1: Pt seated in w/c upon PT arrival, agreeable to therapy tx and denies pain. Pt's family present for education this session. Pt transported from room>gym in w/c. Therapist educated pt and family on transfer techniques from w/c<>bed with the mat set at 23 inches to simulate bed at home. Pt transfers to the R to get onto the bed, without hemiwalker and mod assist with therapist, therapist educating family on appropriate cues to provide. Pt performed stand pivot transfer back to w/c towards the L with use of hemiwalker and min assist, therapist educating family on appropriate cues to provide. Pt performed stand pivot transfer in each direction w/c<>bed with daughter x 1, grandson x 1 and son in law x 1, therapist providing education throughout. Pt transferred from w/c>bed with min assist from PT, performed bed mobility on mat with min assist with therapist educating on techniques. Pt transferred back to w/c with min assist and transported back to room, left in care of family with needs in reach.   Session 2: Pt seated in w/c upon PT arrival, agreeable to therapy tx and denies pain. Pt's family present for education this session (youngest daughter). Pt transported from room>gym in w/c. Therapist educated pt and family on transfer techniques from w/c<>bed. Pt performed stand pivot from w/c>mat towards the R with min assist and from mat>w/c towards the L with hemiwalker and min assist, therapist educating family on appropriate cues to provide. Pt performed stand pivot transfer in each direction w/c<>mat with daughter x 2, therapist  providing education throughout. Pt transferred from sitting>sidelying on R with min assist. In sidelying pt performed L LE exercises with powder board for gravity eliminated neuro re-ed AAROM including hip flexion and knee extension. Therapist educated pt's daughter on performing these exercises at home as part of HEP. Therapist also educated family on performing hamstring stretch and hip flexor stretches everyday up to 3x per day. Pt transferred to sitting with min assist and back to w/c stand pivot min assist. Pt ambulated 3 x 15 ft with hemiwalker and max assist for postural control, able to advance L LE during swing with therapist assisting for foot placement and manual facilitation for knee extension during L LE stance. Pt performed x 2 stand pivot transfers from w/c<>mat with assist from daughter for continued family education. Pt transported back to room and left seated with needs in reach.     Therapy Documentation Precautions:  Precautions Precautions: Fall Precaution Comments: L sided weakness Restrictions Weight Bearing Restrictions: No   See Function Navigator for Current Functional Status.   Therapy/Group: Individual Therapy  Netta Corrigan, PT, DPT 09/11/2017, 7:41 AM

## 2017-09-12 ENCOUNTER — Inpatient Hospital Stay (HOSPITAL_COMMUNITY): Payer: Medicare Other | Admitting: Occupational Therapy

## 2017-09-12 ENCOUNTER — Inpatient Hospital Stay (HOSPITAL_COMMUNITY): Payer: Medicare Other | Admitting: Physical Therapy

## 2017-09-12 ENCOUNTER — Inpatient Hospital Stay (HOSPITAL_COMMUNITY): Payer: Medicare Other

## 2017-09-12 LAB — GLUCOSE, CAPILLARY
GLUCOSE-CAPILLARY: 132 mg/dL — AB (ref 70–99)
Glucose-Capillary: 114 mg/dL — ABNORMAL HIGH (ref 70–99)
Glucose-Capillary: 199 mg/dL — ABNORMAL HIGH (ref 70–99)

## 2017-09-12 NOTE — Discharge Summary (Signed)
Discharge summary job [id number]

## 2017-09-12 NOTE — Progress Notes (Signed)
Occupational Therapy Discharge Summary  Patient Details  Name: Fred [name] MRN: [id number] Date of Birth: 09/16/1935   Patient has met 4 of 7 long term goals due to improved activity tolerance, improved balance, postural control, ability to compensate for deficits, improved awareness and improved coordination.  Patient to discharge at overall Mod Assist level.  Patient's children and wife will provide needed assist at d/c. Hands on family education was on-going throughout rehab admission and family voices and demonstrates willing and ableness to provide all needed assist at d/c. Recommend standing only for ADL tasks and no ambulation without skilled therapist assist. Pt to use hemi walker for static standing balance only. Recommend bathing to take place at w/c level only due to complexity of tub/shower transfer and pt's decreased sitting balance.  Reasons goals not met: Pt requires max- total A for LB dressing and toileting tasks due to poor standing balance and requires use of R UE for static standing balance using hemi walker  Recommendation:  Patient will benefit from ongoing skilled OT services in home health setting to continue to advance functional skills in the area of BADL and Reduce care partner burden.  Equipment: Drop arm BSC  Reasons for discharge: treatment goals met and discharge from hospital  Patient/family agrees with progress made and goals achieved: Yes  OT Discharge Precautions/Restrictions  Precautions Precautions: Fall Precaution Comments: L hemi Restrictions Weight Bearing Restrictions: No ADL ADL ADL Comments: see functional navigator Vision Baseline Vision/History: Wears glasses Wears Glasses: At all times Patient Visual Report: No change from baseline Vision Assessment?: No apparent visual deficits Eye Alignment: Within Functional Limits Perception  Perception: Within Functional Limits Praxis Praxis: Intact Cognition Overall Cognitive Status:  Within Functional Limits for tasks assessed Arousal/Alertness: Awake/alert Orientation Level: Oriented X4 Memory: Appears intact Awareness: Appears intact Problem Solving: Appears intact Safety/Judgment: Impaired Comments: Decreased awareness of deficits Sensation Sensation Light Touch: Impaired Detail Light Touch Impaired Details: Impaired RLE;Impaired LLE Proprioception: Impaired Detail Proprioception Impaired Details: Impaired LUE;Impaired LLE Coordination Gross Motor Movements are Fluid and Coordinated: No Fine Motor Movements are Fluid and Coordinated: No Coordination and Movement Description: L UE/LE hemiparesis and increased tone limiting movement Motor  Motor Motor: Hemiplegia;Abnormal tone Motor - Skilled Clinical Observations: L hemiparesis with increased tone in the UE and the LE  Motor - Discharge Observations: L hemiparesis with increased tone in the UE and the LE  Mobility  Bed Mobility Bed Mobility: Rolling Right;Rolling Left;Supine to Sit;Sit to Supine Rolling Right: Supervision/verbal cueing Rolling Left: Supervision/Verbal cueing Supine to Sit: Supervision/Verbal cueing;Contact Guard/Touching assist Sit to Supine: Supervision/Verbal cueing;Contact Guard/Touching assist Transfers Sit to Stand: Minimal Assistance - Patient > 75% Sit to Stand Details: Verbal cues for technique;Verbal cues for precautions/safety;Manual facilitation for placement;Manual facilitation for weight bearing  Trunk/Postural Assessment  Cervical Assessment Cervical Assessment: Exceptions to WFL(Forward head) Thoracic Assessment Thoracic Assessment: Exceptions to WFL(Kyphotic) Lumbar Assessment Lumbar Assessment: Exceptions to WFL(Posterior pelvic) Postural Control Postural Control: Deficits on evaluation Trunk Control: L lateral lean/pushing  Balance Balance Balance Assessed: Yes Static Sitting Balance Static Sitting - Balance Support: Feet supported;Right upper extremity  supported;Left upper extremity supported Static Sitting - Level of Assistance: 5: Stand by assistance;4: Min assist Dynamic Sitting Balance Dynamic Sitting - Balance Support: Feet supported;During functional activity Dynamic Sitting - Level of Assistance: 5: Stand by assistance Sitting balance - Comments: L posterior LOB during dynamic sitting EOB Static Standing Balance Static Standing - Balance Support: During functional activity;Right upper extremity supported Static Standing - Level of  Assistance: 4: Min assist;3: Mod assist Dynamic Standing Balance Dynamic Standing - Balance Support: Right upper extremity supported;During functional activity Dynamic Standing - Level of Assistance: 3: Mod assist;4: Min assist Extremity/Trunk Assessment RUE Assessment RUE Assessment: Within Functional Limits LUE Assessment LUE Assessment: Exceptions to [facility] General Strength Comments: LUE: shoulder abduction, elbow flexion/extension 3/5, hand grip 2+/5 . Increase in tone LUE Body System: Neuro   See Function Navigator for Current Functional Status.  Fred [name] L 09/12/2017, 4:07 PM

## 2017-09-12 NOTE — Progress Notes (Signed)
[place] PHYSICAL MEDICINE & REHABILITATION     PROGRESS NOTE  Subjective/Complaints:     ROS: Denies CP, SOB, nausea, vomiting, diarrhea  Objective: Vital Signs: Blood pressure 112/63, pulse 67, temperature 97.6 F (36.4 C), resp. rate 16, height 5\' 3"  (1.6 m), weight 80.7 kg (177 lb 14.6 oz), SpO2 95 %. No results found. No results for input(s): WBC, HGB, HCT, PLT in the last 72 hours. Recent Labs    09/11/17 0624  NA 141  K 3.6  CL 106  GLUCOSE 123*  BUN 20  CREATININE 1.02  CALCIUM 9.1   CBG (last 3)  Recent Labs    09/11/17 1631 09/11/17 2134 09/12/17 0703  GLUCAP 152* 179* 114*    Wt Readings from Last 3 Encounters:  09/11/17 80.7 kg (177 lb 14.6 oz)  08/10/17 84.4 kg (186 lb)  07/18/17 87.1 kg (192 lb)    Physical Exam:  BP 112/63 (BP Location: Right Arm)   Pulse 67   Temp 97.6 F (36.4 C)   Resp 16   Ht 5\' 3"  (1.6 m)   Wt 80.7 kg (177 lb 14.6 oz)   SpO2 95%   BMI 31.52 kg/m  Constitutional: No distress . Vital signs reviewed. HENT: Normocephalic.  Atraumatic. Eyes: EOMI. No discharge. Cardiovascular: RRR. No JVD. Respiratory: CTA Bilaterally. Normal effort. GI: BS +. Non-distended. Musc: Right knee with edema and tenderness resolved Neurological: He is alert and oriented Follows full commands.  Fair awareness of deficits.  Motor: LUE: shoulder abduction, elbow flexion/extension 3/5, hand grip 2+/5 . Increase in tone LLE: hip flexion 2/5, distally trace (stable) Skin: Skin is warm and dry.  Psychiatric: He has a normal mood and affect. His behavior is normal.   Assessment/Plan: 1. 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 Physiatrist is providing close team supervision and 24 hour management of active medical problems listed below. Physiatrist and rehab team continue to assess barriers to  discharge/monitor patient progress toward functional and medical goals.  Function:  Bathing Bathing position   Position: Shower  Bathing parts Body parts bathed by patient: Chest, Abdomen, Right upper leg, Left upper leg, Front perineal area, Left arm, Right lower leg Body parts bathed by helper: Buttocks, Left arm  Bathing assist Assist Level: Touching or steadying assistance(Pt > 75%), Assistive device Assistive Device Comment: LH sponge    Upper Body Dressing/Undressing Upper body dressing   What is the patient wearing?: Pull over shirt/dress     Pull over shirt/dress - Perfomed by patient: Thread/unthread left sleeve, Thread/unthread right sleeve, Put head through opening, Pull shirt over trunk Pull over shirt/dress - Perfomed by helper: Pull shirt over trunk        Upper body assist Assist Level: Supervision or verbal cues, Set up   Set up : To obtain clothing/put away  Lower Body Dressing/Undressing Lower body dressing   What is the patient wearing?: Pants, Socks, Shoes   Underwear - Performed by helper: Thread/unthread right underwear leg, Thread/unthread left underwear leg, Pull underwear up/down Pants- Performed by patient: Thread/unthread right pants leg, Pull pants up/down Pants- Performed by helper: Thread/unthread left pants leg   Non-skid slipper socks- Performed by helper: Don/doff right sock, Don/doff left sock   Socks - Performed by helper: Don/doff right sock, Don/doff left sock Shoes - Performed by patient: Don/doff right shoe, Fasten right Shoes - Performed by helper: Don/doff left  shoe, Fasten left          Lower body assist Assist for lower body dressing: Touching or steadying assistance (Pt > 75%)      Toileting Toileting Toileting activity did not occur: No continent bowel/bladder event Toileting steps completed by patient: Adjust clothing prior to toileting, Performs perineal hygiene, Adjust clothing after toileting Toileting steps completed by  helper: Adjust clothing prior to toileting, Performs perineal hygiene, Adjust clothing after toileting Toileting Assistive Devices: Grab bar or rail  Toileting assist Assist level: Two helpers   Transfers Chair/bed transfer   Chair/bed transfer method: Stand pivot Chair/bed transfer assist level: Moderate assist (Pt 50 - 74%/lift or lower) Chair/bed transfer assistive device: Armrests, Other(hemiwalker) Mechanical lift: Stedy   Locomotion Ambulation     Max distance: 30' Assist level: Moderate assist (Pt 50 - 74%)   Wheelchair   Type: Manual Max wheelchair distance: 150' Assist Level: Supervision or verbal cues  Cognition Comprehension Comprehension assist level: Understands basic 90% of the time/cues < 10% of the time  Expression Expression assist level: Expresses basic 90% of the time/requires cueing < 10% of the time.  Social Interaction Social Interaction assist level: Interacts appropriately 90% of the time - Needs monitoring or encouragement for participation or interaction.  Problem Solving Problem solving assist level: Solves basic 90% of the time/requires cueing < 10% of the time  Memory Memory assist level: Recognizes or recalls 90% of the time/requires cueing < 10% of the time    Medical Problem List and Plan:  1. 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 Aspirin and Plavix x3 weeks then aspirin alone off Plavix  6/20  Cont CIR  WHO/PRAFO  2. DVT Prophylaxis/Anticoagulation: Subcutaneous Lovenox  3. Pain Management: Tylenol as needed  4. Mood: Provide emotional support  5. Neuropsych: This patient is capable of making decisions on his own behalf.  6. Skin/Wound Care: Routine skin checks  7. Fluids/Electrolytes/Nutrition: Routine in and outs   BMP normal /improved 7/1 Encourage fluids  8. CAD with CABG. Continue aspirin and Plavix. No chest pain or shortness of breath  9. Diabetes  mellitus. Hemoglobin A1c 6.0. SSI. Check blood sugars before meals and at bedtime   Home meds Glucophage 1000 mg twice daily, Amaryl 4 mg daily, Lantus insulin 10 units nightly, Tradjenta 5 mg daily  CBG (last 3)  Recent Labs    09/11/17 1631 09/11/17 2134 09/12/17 0703  GLUCAP 152* 179* 114*   Tradjenta 5mg , Metformin 500mg  BID  controlled on 7/2 10. Dysphagia. Dysphasia #2 nectar liquids. Follow-up speech therapy  11. Hypertension. Cozaar 25 mg daily, Toprol 50 mg daily  Vitals:   09/11/17 2048 09/12/17 0431  BP: 134/74 112/63  Pulse: 75 67  Resp: 18 16  Temp: 98.9 F (37.2 C) 97.6 F (36.4 C)  SpO2: 98% 95%   Controlled 7/2 12. Diastolic congestive heart failure. Monitor for any signs of fluid overload. Patient was taking Lasix 40 mg daily if needed for fluid retention prior to admission.   No SOB, restarted low dose Lasix 10mg , increased to 20mg  - no LE swelling   Filed Weights   09/09/17 0633 09/10/17 0439 09/11/17 0535  Weight: 80.2 kg (176 lb 12.9 oz) 80.9 kg (178 lb 5.6 oz) 80.7 kg (177 lb 14.6 oz)   wts stable 13. History of seizure disorder. Depakote 500 mg daily.  14. Hyperlipidemia. Lipitor  15. History of alcohol use. Monitor for withdrawal  16.  Elevated Creat resolved, remains normal on Metformin 17.  Constipation-improved, reduced senna to 1po BID, continent of stool continue Bisacodyl supp prn 18. Knee OA pain: Resolved  Multifactorial - OA + gout  X-ray is reviewed 04/2017 showing OA  History of gout with elevated uric acid level.  Colchicine started on 6/25  Voltaren gel 19. Anemia resolved  Hemoglobin 12.8 on 6/28  Continue to monitor   LOS (Days) 32 A FACE TO FACE EVALUATION WAS PERFORMED  Charlett Blake 09/12/2017 7:31 AM

## 2017-09-12 NOTE — Progress Notes (Signed)
Occupational Therapy Session Note  Patient Details  Name: Fred [name] MRN: [id number] Date of Birth: 11-03-1935  Today's Date: 09/12/2017 OT Individual Time: 0881-1031 OT Individual Time Calculation (min): 75 min    Short Term Goals: Week 4:  OT Short Term Goal 1 (Week 4): STG=LTG 2/2 ELOS  Skilled Therapeutic Interventions/Progress Updates:    PT seen for OT ADL bathing/dressing session and for family education. Pt in supine upon arrival with daughter and son-law-present for family education session. Pt denying pain this morning and voicing excitement for planned d/c tomorrow.  Pt transferred to sitting EOB from flat bed in simulation of home environemnt with min A and VCs for dequencing/technique. Pt's caregivers provided assist for squat pivot transfer EOB<> w/c, cuing from therapist for technique for pt and caregiver. Completed x3, pt's caregivers ultimatly preferred assisting from behind for transfer. Bathing/dressing completed from w/c level at sink as pt will do at d/c. Pt's family provided assist and cuing from therapist for ways to incorporate L UE into functional tasks.  Pt stood with use of hemi walker multiple times throughout session, requiring min-mod A for sit>stand and mod-max A for standing balance, pt not able to bear weight through L LE. Pt's family assisted with sit>stand and standing balance. Pt left seated in w/c at end of session, family present. Pt's family voices willing and ableness to provide needed assist at d/c, and pt's fluctuating levels of assist.   Therapy Documentation Precautions:  Precautions Precautions: Fall Precaution Comments: L sided weakness Restrictions Weight Bearing Restrictions: No Pain:   No/denies pain ADL: ADL ADL Comments: see functional navigator  See Function Navigator for Current Functional Status.   Therapy/Group: Individual Therapy  [name] 09/12/2017, 6:49 AM

## 2017-09-12 NOTE — Progress Notes (Signed)
Physical Therapy Discharge Summary  Patient Details  Name: Fred [name] MRN: [id number] Date of Birth: [birth year]  Today's Date: 09/12/2017 PT Individual Time: 1100-1200, 1300-1410 PT Individual Time Calculation (min): 60 min and 70 min    Patient has met 6 of 8 long term goals due to improved activity tolerance, improved balance, improved postural control, increased strength and increased awareness.  Patient to discharge at a wheelchair level [place].   Patient's care partner is independent to provide the necessary physical assistance at discharge. Pt's family (including 2 of his daughters, son-in-law, grandson and granddaughter) has been present for multiple therapy sessions to participate in family training and education. Family has performed and educated on techniques for car transfer with slideboard and w/c<>bed squat pivot transfers with hemiwalker/armrests.   Reasons goals not met: Pt did not meet ambulation goals and will only be ambulating at home with home health therapy and not with family. Pt will be discharging at a wheelchair level, transfers only with the family.   Recommendation:  Patient will benefit from ongoing skilled PT services in home health setting to continue to advance safe functional mobility, address ongoing impairments in balance, postural control, L neuro re-ed, and minimize fall risk.  Equipment: hemiwalker and w/c  Reasons for discharge: treatment goals met  Patient/family agrees with progress made and goals achieved: Yes   PT Treatment Interventions: Session 1: Pt seated in w/c upon PT arrival, agreeable to therapy tx and denies pain. Pt transported outside to perform real car transfer with family. Pt performed stand pivot with hemiwalker into car, mod assist from therapist. Pt performed squat pivot out of the car reaching for w/c armrest with R UE and min assist. Pt performed transfer with assist from family member, therapist providing education on  techniques. Pt with LOB to the L going into the car requiring max assist to correct. Therapist educating pt and family that using the slideboard to get into the car will be the safest option. Pt performed slideboard transfer into the car with mod assist and verbal cues for techniques. Pt performed transfer out of the car min assist. Pt transported back back to the gym. Pt performed w/c<>bed transfer with assist from son in law, able to perform transfer without cues from therapist. Pt transported back to room. Therapist provided HEP handout and also wrote down specifics for transfers at home. Pt left seated in w/c with needs in reach and family present.   Session 2: Pt seated in w/c upon PT arrival, agreeable to therapy tx and denies pain. Pt's son in law present for continued family training with car transfers. Pt transported to ortho gym in w/c. Pt performed x 1 car transfer with therapist min assist using slideboard and x 2 car transfers with son in law using slideboard, therapist providing education on set up and techniques throughout. Pt transported to gym in w/c. Pt worked on blocked practice of both slideboard transfers and squat pivot transfers from w/c<>mat. Pt performed x 2 slideboard transfers from w/c<>mat with min assist in each direction, verbal cues for techniques and weightshifting. Pt performed x3 squat pivot transfers from w/c>mat towards the L and mat>w/c towards the R using arm rests and mod fading to min assist, verbal cues for techniques. Pt performed sit<>stand from mat with supervision and use of hemiwalker, in standing pt performed pre-gait tasks with mod assist for postural control, verbal and tactile cues to correct L lateral lean/pushing. Pt ambulated x 25 ft at the rail this  session with mod assist, verbal cues for techniques and manual facilitation for R lateral weightshifting to clear L foot. Pt propelled w/c back to room x150 ft with supervision. Pt left seated in w/c with family  present, needs in reach.   PT Discharge Precautions/Restrictions Precautions Precautions: Fall Precaution Comments: L sided weakness Restrictions Weight Bearing Restrictions: No Pain Pain Assessment Pain Scale: 0-10 Pain Score: 0-No pain Cognition Overall Cognitive Status: Within Functional Limits for tasks assessed Arousal/Alertness: Awake/alert Orientation Level: Oriented X4 Memory: Appears intact Awareness: Appears intact Problem Solving: Appears intact Safety/Judgment: Appears intact Sensation Sensation Light Touch: Impaired Detail Light Touch Impaired Details: Impaired RLE;Impaired LLE Proprioception: Impaired Detail Proprioception Impaired Details: Impaired LUE;Impaired LLE Coordination Gross Motor Movements are Fluid and Coordinated: No Fine Motor Movements are Fluid and Coordinated: No Coordination and Movement Description: L UE/LE hemiparesis and increased tone limiting movement Motor  Motor Motor: Hemiplegia;Abnormal tone Motor - Skilled Clinical Observations: L hemiparesis with increased tone in the UE and the LE   Mobility Bed Mobility Bed Mobility: Rolling Right;Rolling Left;Supine to Sit;Sit to Supine Rolling Right: Supervision/verbal cueing Rolling Left: Supervision/Verbal cueing Supine to Sit: Supervision/Verbal cueing;Contact Guard/Touching assist Sit to Supine: Supervision/Verbal cueing;Contact Guard/Touching assist Transfers Transfers: Sit to Stand;Squat Pivot Transfers;Stand Pivot Transfers Sit to Stand: Minimal Assistance - Patient > 75% Sit to Stand Details: Verbal cues for technique;Verbal cues for precautions/safety;Manual facilitation for placement;Manual facilitation for weight bearing Stand Pivot Transfers: Minimal Assistance - Patient > 75%;Moderate Assistance - Patient 50 - 74% Squat Pivot Transfers: Moderate Assistance - Patient 50-74%;Minimal Assistance - Patient > 75% Squat Pivot Transfer Details: Verbal cues for technique;Manual  facilitation for weight bearing;Manual facilitation for weight shifting Locomotion  Gait Ambulation: Yes Gait Assistance: Maximal Assistance - Patient 25-49%;Moderate Assistance - Patient 50-74% Gait Distance (Feet): 30 Feet Assistive device: Hemi-walker Gait Assistance Details: Manual facilitation for placement;Manual facilitation for weight shifting  Trunk/Postural Assessment  Cervical Assessment Cervical Assessment: Exceptions to WFL(forward head) Thoracic Assessment Thoracic Assessment: Exceptions to WFL(kyphosis) Lumbar Assessment Lumbar Assessment: Exceptions to WFL(posteior pelvic tilt) Postural Control Postural Control: Deficits on evaluation Trunk Control: L lateral lean/pushing  Balance Static Sitting Balance Static Sitting - Level of Assistance: 5: Stand by assistance Dynamic Sitting Balance Dynamic Sitting - Level of Assistance: 5: Stand by assistance Static Standing Balance Static Standing - Level of Assistance: 4: Min assist Dynamic Standing Balance Dynamic Standing - Level of Assistance: 3: Mod assist;4: Min assist Extremity Assessment  RLE Assessment RLE Assessment: Within Functional Limits LLE Assessment LLE Assessment: Exceptions to [facility] LLE Strength Left Hip Flexion: 2-/5 Left Hip Extension: 3-/5 Left Knee Flexion: 1/5 Left Knee Extension: 2+/5 Left Ankle Dorsiflexion: 0/5 Left Ankle Plantar Flexion: 0/5 LLE Tone LLE Tone: Hypertonic;Modified Ashworth Body Part - Modified Ashworth Scale: Hamstrings Hamstrings - Modified Ashworth Scale for Grading Hypertonia LLE: Slight increase in muscle tone, manifested by a catch, followed by minimal resistance throughout the remainder (less than half) of the ROM   See Function Navigator for Current Functional Status.  Netta Corrigan, PT, DPT 09/12/2017, 12:22 PM

## 2017-09-12 NOTE — Discharge Summary (Signed)
NAME: Fred [name], Fred [name] MEDICAL RECORD ZY:[id number] ACCOUNT [id number] DATE OF BIRTH:[birth year] FACILITY: [facility] LOCATION: [facility] PHYSICIAN:ANDREW Letta Pate, MD  DISCHARGE SUMMARY  DATE OF DISCHARGE:  09/13/2017  DISCHARGE DIAGNOSES: 1.  Right posterior limb internal capsule infarction; subcutaneous Lovenox for DVT prophylaxis.  2.  Coronary artery disease with coronary artery bypass grafting  3.  Diabetes mellitus with peripheral neuropathy. 4.  Dysphagia.   5. Hypertension.   6. Diastolic congestive heart failure.  7. History of seizure disorder. 8.  Hyperlipidemia.   9.  History of alcohol use.   10. Constipation. 11. Anemia.  HOSPITAL COURSE:  This is an 82 year old right-handed male with history of alcohol use, hypertension, seizure disorder as well as CVA, CAD with CABG, diastolic congestive heart failure.  Lives with spouse, independent prior to admission.  Presented  08/09/2017 with left-sided weakness, facial droop, slurred speech.  Cranial CT scan negative.  CT angiogram of head and neck showed mild less than 50% proximal right ICA stenosis and moderate to severe 70% proximal left ICA stenosis.  MRI showed  subcentimeter acute versus early subacute infarction within the right posterior limb of internal capsule.  Echocardiogram with ejection fraction of 10%, grade I diastolic dysfunction.  Maintained on aspirin and Plavix x3 weeks, then aspirin alone.   Subcutaneous Lovenox for DVT prophylaxis.  Dysphagia 2, nectar thick liquid diet.  The patient was admitted for a comprehensive rehabilitation program.  PAST MEDICAL HISTORY:  See discharge diagnoses.  SOCIAL HISTORY:  Lives with spouse, independent prior to admission.  FUNCTIONAL STATUS:  Upon admission to rehabilitation services was max assist supine to sit, max assist sit to supine, max total assist with activities of daily living.  PHYSICAL EXAMINATION: VITAL SIGNS:  Blood pressure 136/78, pulse 99, temperature 97,  respirations 18. GENERAL:  Alert male in no acute distress, oriented to person, place and time.  Fair awareness of deficits.  HEENT:  EOMs intact. NECK:  Supple, nontender.  No JVD. CARDIOVASCULAR:  Rate controlled. ABDOMEN:  Soft, nontender.  Good bowel sounds. LUNGS:  Clear to auscultation without wheeze.  REHABILITATION HOSPITAL COURSE:  The patient was admitted to inpatient rehabilitation services.  Therapies initiated on a 3-hour daily basis, consisting of physical therapy, occupational therapy, speech therapy and rehabilitation nursing.  The following  issues were addressed during patient's rehabilitation stay:    Pertaining to the patient's right posterior limb internal capsule infarction, remained stable, maintained on aspirin and Plavix therapy.  Simplified to aspirin therapy at the recommendations of Neurology Services.  Subcutaneous Lovenox for DVT  prophylaxis.  His diet has been advanced to regular consistency.  Blood sugars overall controlled.  Hemoglobin A1c of 6.0.  He remained on Tradjenta as well as Glucophage.  Blood pressure is controlled, monitored remaining on Lasix, Cozaar and Toprol.   He exhibited no other signs of fluid overload.  No chest pain or shortness of breath.    The patient with documented history of prior seizures, continued on Depakote.  No seizure activity.    He did have some osteoarthritis of his knees.    History of gout, noted elevated uric acid level placed on colchicine with good results as well as Voltaren L.    The patient received weekly collaborative interdisciplinary team conferences to discuss estimated length of stay, family teaching, any barriers to discharge.  The patient wheelchair to bed with mat setup for simulated in bed to home transfers.  Transfers  to the right side to get out of bed.  Moderate assist  with therapies to assist in family.  Performed stand pivot transfers in each direction.  Wheelchair to bed with daughter, grandson and  daughter-in-law.  Ongoing education.  Transferred wheelchair to  bed minimal assist.  Therapist educated patient and family on transfer techniques from wheelchair to bed.  Perform stand pivot wheelchair to bed towards the right with minimal assistance.  Ambulates 15 feet x3 hemiwalker and max assist.  Activities of  daily living and homemaking squat pivot transfers, wheelchair to bedside commode needing min mod assist.  Again, an ongoing family education.  The patient was able to demonstrate his needs.  He was discharged to home.  DISCHARGE MEDICATIONS:  Include allopurinol 300 mg p.o. daily, aspirin 325 mg p.o. daily, Lipitor 80 mg p.o. daily, Voltaren 1 percent 4 times daily to affected area.  Depakote 500 mg p.o. daily, Lasix 20 mg p.o. daily.   Tradjenta 5 mg p.o. daily, Zetia 10 mg daily, Cozaar 25 mg p.o. daily, Glucophage 500 mg p.o. b.i.d., Toprol 50 mg p.o. daily, multivitamin daily,  Niaspan 500 mg p.o. at bedtime, Senokot-S 1 tablet b.i.d., Tylenol as needed.    His diet was a diabetic diet.  The patient would follow up with Dr. Alysia Penna at the outpatient rehab center as directed.  Dr. Antony Contras, call for appointment.  Follow up PCP, Dr. Ward Chatters patient's medical management.  SPECIAL INSTRUCTIONS:  No driving, no alcohol.  AN/NUANCE D:09/12/2017 T:09/12/2017 JOB:[id number]

## 2017-09-13 LAB — GLUCOSE, CAPILLARY: Glucose-Capillary: 169 mg/dL — ABNORMAL HIGH (ref 70–99)

## 2017-09-13 MED ORDER — FUROSEMIDE 20 MG PO TABS: 20.0000 mg | ORAL_TABLET | Freq: Every day | ORAL | 1 refills | Status: DC

## 2017-09-13 MED ORDER — ACETAMINOPHEN 325 MG PO TABS: 650.0000 mg | ORAL_TABLET | ORAL | Status: AC | PRN

## 2017-09-13 MED ORDER — LINAGLIPTIN 5 MG PO TABS: 5.0000 mg | ORAL_TABLET | Freq: Every day | ORAL | 5 refills | Status: DC

## 2017-09-13 MED ORDER — DICLOFENAC SODIUM 1 % TD GEL: 2.0000 g | Freq: Four times a day (QID) | TRANSDERMAL | 1 refills | Status: DC

## 2017-09-13 MED ORDER — METOPROLOL SUCCINATE ER 50 MG PO TB24: 50.0000 mg | ORAL_TABLET | Freq: Every day | ORAL | 1 refills | Status: DC

## 2017-09-13 MED ORDER — EZETIMIBE 10 MG PO TABS: 10.0000 mg | ORAL_TABLET | Freq: Every day | ORAL | 3 refills | Status: DC

## 2017-09-13 MED ORDER — METFORMIN HCL 500 MG PO TABS: 500.0000 mg | ORAL_TABLET | Freq: Two times a day (BID) | ORAL | 1 refills | Status: DC

## 2017-09-13 MED ORDER — ALLOPURINOL 300 MG PO TABS: 300.0000 mg | ORAL_TABLET | Freq: Every day | ORAL | 1 refills | Status: DC

## 2017-09-13 MED ORDER — ASPIRIN 325 MG PO TABS: 325.0000 mg | ORAL_TABLET | Freq: Every day | ORAL | Status: DC

## 2017-09-13 MED ORDER — DIVALPROEX SODIUM ER 500 MG PO TB24: 500.0000 mg | ORAL_TABLET | Freq: Every day | ORAL | 11 refills | Status: DC

## 2017-09-13 MED ORDER — ATORVASTATIN CALCIUM 80 MG PO TABS: 80.0000 mg | ORAL_TABLET | Freq: Every day | ORAL | 1 refills | Status: DC

## 2017-09-13 MED ORDER — NIACIN ER (ANTIHYPERLIPIDEMIC) 500 MG PO TBCR: 500.0000 mg | EXTENDED_RELEASE_TABLET | Freq: Every day | ORAL | 3 refills | Status: DC

## 2017-09-13 MED ORDER — LOSARTAN POTASSIUM 25 MG PO TABS: 25.0000 mg | ORAL_TABLET | Freq: Every day | ORAL | 1 refills | Status: DC

## 2017-09-13 MED ORDER — SENNOSIDES-DOCUSATE SODIUM 8.6-50 MG PO TABS: 1.0000 | ORAL_TABLET | Freq: Two times a day (BID) | ORAL | Status: DC

## 2017-09-13 NOTE — Progress Notes (Signed)
Pt discharged home with personal belongings. Discharge instructions provided to pt and family by Linna Hoff, PA.the patient and family verbalized understanding.

## 2017-09-13 NOTE — Progress Notes (Signed)
Social Work Discharge Note  The overall goal for the admission was met for:   Discharge location: Yes - home with family  Length of Stay: Yes - 32 days  Discharge activity level: Yes - min to mod A wheelchair level  Home/community participation: Yes  Services provided included: MD, RD, PT, OT, SLP, RN, Pharmacy, Neuropsych and SW  Financial Services: Medicare, Private Insurance: Theme park [employer] and Other: [employer] for Life  Follow-up services arranged: Home Health: PT/OT/RN, DME: 18"x16" lightweight wheelchair with basic cushion; hemi-walker; bedside commode; 30" transfer board and Patient/Family request agency HH: [facility], DME: [facility]  Comments (or additional information): Family to take turns being with pt 24/7.  They have received family education and feel prepared to care for pt at home.  Pt is ready to go home.  Patient/Family verbalized understanding of follow-up arrangements: Yes  Individual responsible for coordination of the follow-up plan: pt with his family  Confirmed correct DME delivered: Trey Sailors 09/13/2017    Can Lucci, Silvestre Mesi

## 2017-09-13 NOTE — Discharge Instructions (Signed)
Inpatient Rehab Discharge Instructions  [name] Discharge date and time: No discharge date for patient encounter.   Activities/Precautions/ Functional Status: Activity: activity as tolerated Diet: Diabetic diet Wound Care: none needed Functional status:  ___ No restrictions     ___ Walk up steps independently ___ 24/7 supervision/assistance   ___ Walk up steps with assistance ___ Intermittent supervision/assistance  ___ Bathe/dress independently ___ Walk with walker     _x__ Bathe/dress with assistance ___ Walk Independently    ___ Shower independently ___ Walk with assistance    ___ Shower with assistance ___ No alcohol     ___ Return to work/school ________  COMMUNITY REFERRALS UPON DISCHARGE:   Home Health:   PT     OT     RN    Agency:  [facility] Phone:  [phone] Medical Equipment/Items Ordered:  18"x16" lightweight wheelchair with basic cushion; bedside commode; hemi-walker; 30" transfer board  Agency/Supplier:  [facility]          Phone:  [phone]  GENERAL COMMUNITY RESOURCES FOR PATIENT/FAMILY: Support Groups:  [facility] Stroke Support Group                              Meets the second Thursday of each month from 3-4 PM (except June, July, and August)                              In the dayroom of [place] at [facility]                              For more information, call [phone]  Special Instructions:  No driving or alcohol   Follow-up with primary care provider in regards to ongoing management of diabetes  STROKE/TIA DISCHARGE INSTRUCTIONS SMOKING Cigarette smoking nearly doubles your risk of having a stroke & is the single most alterable risk factor  If you smoke or have smoked in the last 12 months, you are advised to quit smoking for your health.  Most of the excess cardiovascular risk related to smoking disappears within a year of stopping.  Ask you doctor about  anti-smoking medications  [place] Quit Line: 1-800-QUIT NOW  Free Smoking Cessation Classes [phone]  CHOLESTEROL Know your levels; limit fat & cholesterol in your diet  Lipid Panel     Component Value Date/Time   CHOL 195 08/10/2017 0334   TRIG 125 08/10/2017 0334   HDL 40 (L) 08/10/2017 0334   CHOLHDL 4.9 08/10/2017 0334   VLDL 25 08/10/2017 0334   LDLCALC 130 (H) 08/10/2017 0334      Many patients benefit from treatment even if their cholesterol is at goal.  Goal: Total Cholesterol (CHOL) less than 160  Goal:  Triglycerides (TRIG) less than 150  Goal:  HDL greater than 40  Goal:  LDL (LDLCALC) less than 100   BLOOD PRESSURE American Stroke Association blood pressure target is less that 120/80 mm/Hg  Your discharge blood pressure is:  BP: 129/66  Monitor your blood pressure  Limit your salt and alcohol intake  Many individuals will require more than one medication for high blood pressure  DIABETES (A1c is a blood sugar average for last 3 months) Goal HGBA1c is under 7% (HBGA1c is blood sugar average for last 3  months)  Diabetes:    Lab Results  Component Value Date   HGBA1C 6.0 (H) 08/10/2017     Your HGBA1c can be lowered with medications, healthy diet, and exercise.  Check your blood sugar as directed by your physician  Call your physician if you experience unexplained or low blood sugars.  PHYSICAL ACTIVITY/REHABILITATION Goal is 30 minutes at least 4 days per week  Activity: Increase activity slowly, Therapies: Physical Therapy: Home Health Return to work:   Activity decreases your risk of heart attack and stroke and makes your heart stronger.  It helps control your weight and blood pressure; helps you relax and can improve your mood.  Participate in a regular exercise program.  Talk with your doctor about the best form of exercise for you (dancing, walking, swimming, cycling).  DIET/WEIGHT Goal is to maintain a healthy weight  Your discharge diet is:    Diet Order           DIET DYS 2 Room service appropriate? Yes; Fluid consistency: Nectar Thick  Diet effective now          liquids Your height is:    Your current weight is: Weight: 83.3 kg (183 lb 10.3 oz) Your Body Mass Index (BMI) is:  BMI (Calculated): 32.54  Following the type of diet specifically designed for you will help prevent another stroke.  Your goal weight range is:    Your goal Body Mass Index (BMI) is 19-24.  Healthy food habits can help reduce 3 risk factors for stroke:  High cholesterol, hypertension, and excess weight.  RESOURCES Stroke/Support Group:  Call [phone]   STROKE EDUCATION PROVIDED/REVIEWED AND GIVEN TO PATIENT Stroke warning signs and symptoms How to activate emergency medical system (call 911). Medications prescribed at discharge. Need for follow-up after discharge. Personal risk factors for stroke. Pneumonia vaccine given:  Flu vaccine given:  My questions have been answered, the writing is legible, and I understand these instructions.  I will adhere to these goals & educational materials that have been provided to me after my discharge from the hospital.      My questions have been answered and I understand these instructions. I will adhere to these goals and the provided educational materials after my discharge from the hospital.  Patient/Caregiver Signature _______________________________ Date __________  Clinician Signature _______________________________________ Date __________  Please bring this form and your medication list with you to all your follow-up doctor's appointments.

## 2017-09-13 NOTE — Progress Notes (Signed)
[name] PHYSICAL MEDICINE & REHABILITATION     PROGRESS NOTE  Subjective/Complaints:   Ready to go home  ROS: Denies CP, SOB, nausea, vomiting, diarrhea  Objective: Vital Signs: Blood pressure 117/68, pulse 73, temperature 99.2 F (37.3 C), temperature source Oral, resp. rate 18, height 5\' 3"  (1.6 m), weight 80.6 kg (177 lb 11.2 oz), SpO2 96 %. No results found. No results for input(s): WBC, HGB, HCT, PLT in the last 72 hours. Recent Labs    09/11/17 0624  NA 141  K 3.6  CL 106  GLUCOSE 123*  BUN 20  CREATININE 1.02  CALCIUM 9.1   CBG (last 3)  Recent Labs    09/12/17 1627 09/12/17 2115 09/13/17 0505  GLUCAP 199* 132* 169*    Wt Readings from Last 3 Encounters:  09/13/17 80.6 kg (177 lb 11.2 oz)  08/10/17 84.4 kg (186 lb)  07/18/17 87.1 kg (192 lb)    Physical Exam:  BP 117/68 (BP Location: Right Arm)   Pulse 73   Temp 99.2 F (37.3 C) (Oral)   Resp 18   Ht 5\' 3"  (1.6 m)   Wt 80.6 kg (177 lb 11.2 oz)   SpO2 96%   BMI 31.48 kg/m  Constitutional: No distress . Vital signs reviewed. HENT: Normocephalic.  Atraumatic. Eyes: EOMI. No discharge. Cardiovascular: RRR. No JVD. Respiratory: CTA Bilaterally. Normal effort. GI: BS +. Non-distended. Musc: Right knee with edema and tenderness resolved Neurological: He is alert and oriented Follows full commands.  Fair awareness of deficits.  Motor: LUE: shoulder abduction, elbow flexion/extension 3/5, hand grip 2+/5 . Increase in tone LLE: hip flexion 2/5, distally trace (stable) Skin: Skin is warm and dry.  Psychiatric: He has a normal mood and affect. His behavior is normal.   Assessment/Plan: 1. 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 Stable for D/C today F/u PCP in 3-4 weeks F/u PM&R 2 weeks See D/C summary See D/C instructions  Function:  Bathing Bathing position   Position: Wheelchair/chair at sink  Bathing parts Body parts bathed by patient: Chest,  Abdomen, Right upper leg, Left upper leg, Front perineal area, Left arm Body parts bathed by helper: Buttocks, Left arm, Back, Right lower leg, Left lower leg  Bathing assist Assist Level: Touching or steadying assistance(Pt > 75%), Assistive device Assistive Device Comment: LH sponge    Upper Body Dressing/Undressing Upper body dressing   What is the patient wearing?: Pull over shirt/dress     Pull over shirt/dress - Perfomed by patient: Thread/unthread left sleeve, Thread/unthread right sleeve, Put head through opening, Pull shirt over trunk Pull over shirt/dress - Perfomed by helper: Pull shirt over trunk        Upper body assist Assist Level: Supervision or verbal cues   Set up : To obtain clothing/put away  Lower Body Dressing/Undressing Lower body dressing   What is the patient wearing?: Pants, Socks, Shoes   Underwear - Performed by helper: Thread/unthread right underwear leg, Thread/unthread left underwear leg, Pull underwear up/down Pants- Performed by patient: Thread/unthread right pants leg Pants- Performed by helper: Thread/unthread left pants leg, Pull pants up/down   Non-skid slipper socks- Performed by helper: Don/doff right sock, Don/doff left sock   Socks - Performed by helper: Don/doff right sock, Don/doff left sock Shoes - Performed by patient: Fasten right, Fasten left(Shoe button) Shoes - Performed by helper: Don/doff right shoe, Don/doff left shoe          Lower body assist Assist for  lower body dressing: Touching or steadying assistance (Pt > 75%)      Toileting Toileting Toileting activity did not occur: No continent bowel/bladder event Toileting steps completed by patient: Adjust clothing prior to toileting, Performs perineal hygiene, Adjust clothing after toileting Toileting steps completed by helper: Adjust clothing prior to toileting, Performs perineal hygiene, Adjust clothing after toileting Toileting Assistive Devices: Grab bar or rail   Toileting assist Assist level: Two helpers   Transfers Chair/bed transfer   Chair/bed transfer [place]: Squat pivot, Lateral scoot Chair/bed transfer assist level: Touching or steadying assistance (Pt > 75%) Chair/bed transfer assistive device: Armrests, Sliding board(hemiwalker) Mechanical lift: Stedy   Locomotion Ambulation     Max distance: 25 ft Assist level: Moderate assist (Pt 50 - 74%)   Wheelchair   Type: Manual Max wheelchair distance: 150' Assist Level: Supervision or verbal cues  Cognition Comprehension Comprehension assist level: Understands basic 90% of the time/cues < 10% of the time  Expression Expression assist level: Expresses basic 90% of the time/requires cueing < 10% of the time.  Social Interaction Social Interaction assist level: Interacts appropriately 90% of the time - Needs monitoring or encouragement for participation or interaction.  Problem Solving Problem solving assist level: Solves basic 90% of the time/requires cueing < 10% of the time  Memory Memory assist level: Recognizes or recalls 75 - 89% of the time/requires cueing 10 - 24% of the time    Medical Problem List and Plan:  1. 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 Aspirin and Plavix x3 weeks then aspirin alone off Plavix  6/20  Cont CIR, PT, OT, SLP WHO/PRAFO  2. DVT Prophylaxis/Anticoagulation: Subcutaneous Lovenox  3. Pain Management: Tylenol as needed  4. Mood: Provide emotional support  5. Neuropsych: This patient is capable of making decisions on his own behalf.  6. Skin/Wound Care: Routine skin checks  7. Fluids/Electrolytes/Nutrition: Routine in and outs   BMP normal /improved 7/1 Encourage fluids  8. CAD with CABG. Continue aspirin and Plavix. No chest pain or shortness of breath  9. Diabetes mellitus. Hemoglobin A1c 6.0. SSI. Check blood sugars before meals and at bedtime   Home meds Glucophage 1000  mg twice daily, Amaryl 4 mg daily, Lantus insulin 10 units nightly, Tradjenta 5 mg daily  CBG (last 3)  Recent Labs    09/12/17 1627 09/12/17 2115 09/13/17 0505  GLUCAP 199* 132* 169*   Tradjenta 5mg , Metformin 500mg  BID  controlled on 7/3 10. Dysphagia. Dysphasia #2 nectar liquids. Follow-up speech therapy  11. Hypertension. Cozaar 25 mg daily, Toprol 50 mg daily  Vitals:   09/12/17 1953 09/13/17 0414  BP: 136/70 117/68  Pulse: 74 73  Resp: 17 18  Temp: 99.2 F (37.3 C) 99.2 F (37.3 C)  SpO2: 99% 96%   Controlled 7/3 12. Diastolic congestive heart failure. Monitor for any signs of fluid overload. Patient was taking Lasix 40 mg daily if needed for fluid retention prior to admission.   No SOB, restarted low dose Lasix 10mg , increased to 20mg  - no LE swelling   Filed Weights   09/10/17 0439 09/11/17 0535 09/13/17 0414  Weight: 80.9 kg (178 lb 5.6 oz) 80.7 kg (177 lb 14.6 oz) 80.6 kg (177 lb 11.2 oz)   wts stable 13. History of seizure disorder. Depakote 500 mg daily.  14. Hyperlipidemia. Lipitor  15. History of alcohol use. Monitor for withdrawal  16.  Elevated Creat resolved, remains normal on Metformin 17.  Constipation-improved, reduced senna to 1po BID, continent of stool continue Bisacodyl supp prn 18. Knee OA pain: Resolved  Multifactorial - OA + gout  X-ray is reviewed 04/2017 showing OA  History of gout with elevated uric acid level.  Colchicine started on 6/25  Voltaren gel    LOS (Days) 33 A FACE TO FACE EVALUATION WAS PERFORMED  Charlett Blake 09/13/2017 7:53 AM

## 2017-09-14 DIAGNOSIS — I69354 Hemiplegia and hemiparesis following cerebral infarction affecting left non-dominant side: Secondary | ICD-10-CM | POA: Diagnosis not present

## 2017-09-14 DIAGNOSIS — I110 Hypertensive heart disease with heart failure: Secondary | ICD-10-CM | POA: Diagnosis not present

## 2017-09-14 DIAGNOSIS — Z794 Long term (current) use of insulin: Secondary | ICD-10-CM | POA: Diagnosis not present

## 2017-09-14 DIAGNOSIS — E1142 Type 2 diabetes mellitus with diabetic polyneuropathy: Secondary | ICD-10-CM | POA: Diagnosis not present

## 2017-09-14 DIAGNOSIS — Z7982 Long term (current) use of aspirin: Secondary | ICD-10-CM | POA: Diagnosis not present

## 2017-09-14 DIAGNOSIS — I2510 Atherosclerotic heart disease of native coronary artery without angina pectoris: Secondary | ICD-10-CM | POA: Diagnosis not present

## 2017-09-14 DIAGNOSIS — G40009 Localization-related (focal) (partial) idiopathic epilepsy and epileptic syndromes with seizures of localized onset, not intractable, without status epilepticus: Secondary | ICD-10-CM | POA: Diagnosis not present

## 2017-09-14 DIAGNOSIS — I480 Paroxysmal atrial fibrillation: Secondary | ICD-10-CM | POA: Diagnosis not present

## 2017-09-14 DIAGNOSIS — D649 Anemia, unspecified: Secondary | ICD-10-CM | POA: Diagnosis not present

## 2017-09-14 DIAGNOSIS — Z951 Presence of aortocoronary bypass graft: Secondary | ICD-10-CM | POA: Diagnosis not present

## 2017-09-14 DIAGNOSIS — M170 Bilateral primary osteoarthritis of knee: Secondary | ICD-10-CM | POA: Diagnosis not present

## 2017-09-14 DIAGNOSIS — Z87891 Personal history of nicotine dependence: Secondary | ICD-10-CM | POA: Diagnosis not present

## 2017-09-14 DIAGNOSIS — E785 Hyperlipidemia, unspecified: Secondary | ICD-10-CM | POA: Diagnosis not present

## 2017-09-14 DIAGNOSIS — I5032 Chronic diastolic (congestive) heart failure: Secondary | ICD-10-CM | POA: Diagnosis not present

## 2017-09-14 DIAGNOSIS — M10061 Idiopathic gout, right knee: Secondary | ICD-10-CM | POA: Diagnosis not present

## 2017-09-14 DIAGNOSIS — I739 Peripheral vascular disease, unspecified: Secondary | ICD-10-CM | POA: Diagnosis not present

## 2017-09-14 DIAGNOSIS — R1310 Dysphagia, unspecified: Secondary | ICD-10-CM | POA: Diagnosis not present

## 2017-09-14 DIAGNOSIS — I69322 Dysarthria following cerebral infarction: Secondary | ICD-10-CM | POA: Diagnosis not present

## 2017-09-15 ENCOUNTER — Telehealth: Payer: Self-pay

## 2017-09-15 NOTE — Telephone Encounter (Signed)
Transitional Care call-patient    1. Are you/is patient experiencing any problems since coming home?No Are there any questions regarding any aspect of care?No 2. Are there any questions regarding medications administration/dosing?Yes Are meds being taken as prescribed?Yes Patient should review meds with caller to confirm 3. Have there been any falls?No 4. Has Home Health been to the house and/or have they contacted you?Yes If not, have you tried to contact them? Can we help you contact them? 5. Are bowels and bladder emptying properly?Yes Are there any unexpected incontinence issues? If applicable, is patient following bowel/bladder programs? 6. Any fevers, problems with breathing, unexpected pain?No 7. Are there any skin problems or new areas of breakdown?No 8. Has the patient/family member arranged specialty MD follow up (ie cardiology/neurology/renal/surgical/etc)?Yes  Can we help arrange? 9. Does the patient need any other services or support that we can help arrange?No 10. Are caregivers following through as expected in assisting the patient?Yes 11. Has the patient quit smoking, drinking alcohol, or using drugs as recommended?Yes  Appointment time, Tuesday 09/26/17 arrive at 2:00 for 2:30 with Dr. Letta Pate [phone]

## 2017-09-15 NOTE — Telephone Encounter (Signed)
Malachy Mood, RN/ADV[facility] called asking if Dr. Naaman Plummer is signing orders for pt. Called Malachy Mood back to let her know that Dr. Letta Pate will be the one signing off on the orders for pt.

## 2017-09-18 DIAGNOSIS — I110 Hypertensive heart disease with heart failure: Secondary | ICD-10-CM | POA: Diagnosis not present

## 2017-09-18 DIAGNOSIS — E1142 Type 2 diabetes mellitus with diabetic polyneuropathy: Secondary | ICD-10-CM | POA: Diagnosis not present

## 2017-09-18 DIAGNOSIS — I2510 Atherosclerotic heart disease of native coronary artery without angina pectoris: Secondary | ICD-10-CM | POA: Diagnosis not present

## 2017-09-18 DIAGNOSIS — M10061 Idiopathic gout, right knee: Secondary | ICD-10-CM | POA: Diagnosis not present

## 2017-09-18 DIAGNOSIS — I69322 Dysarthria following cerebral infarction: Secondary | ICD-10-CM | POA: Diagnosis not present

## 2017-09-18 DIAGNOSIS — I69354 Hemiplegia and hemiparesis following cerebral infarction affecting left non-dominant side: Secondary | ICD-10-CM | POA: Diagnosis not present

## 2017-09-19 DIAGNOSIS — I2510 Atherosclerotic heart disease of native coronary artery without angina pectoris: Secondary | ICD-10-CM | POA: Diagnosis not present

## 2017-09-19 DIAGNOSIS — E1142 Type 2 diabetes mellitus with diabetic polyneuropathy: Secondary | ICD-10-CM | POA: Diagnosis not present

## 2017-09-19 DIAGNOSIS — I110 Hypertensive heart disease with heart failure: Secondary | ICD-10-CM | POA: Diagnosis not present

## 2017-09-19 DIAGNOSIS — I69322 Dysarthria following cerebral infarction: Secondary | ICD-10-CM | POA: Diagnosis not present

## 2017-09-19 DIAGNOSIS — M10061 Idiopathic gout, right knee: Secondary | ICD-10-CM | POA: Diagnosis not present

## 2017-09-19 DIAGNOSIS — I69354 Hemiplegia and hemiparesis following cerebral infarction affecting left non-dominant side: Secondary | ICD-10-CM | POA: Diagnosis not present

## 2017-09-21 DIAGNOSIS — I2510 Atherosclerotic heart disease of native coronary artery without angina pectoris: Secondary | ICD-10-CM | POA: Diagnosis not present

## 2017-09-21 DIAGNOSIS — I69354 Hemiplegia and hemiparesis following cerebral infarction affecting left non-dominant side: Secondary | ICD-10-CM | POA: Diagnosis not present

## 2017-09-21 DIAGNOSIS — M10061 Idiopathic gout, right knee: Secondary | ICD-10-CM | POA: Diagnosis not present

## 2017-09-21 DIAGNOSIS — E1142 Type 2 diabetes mellitus with diabetic polyneuropathy: Secondary | ICD-10-CM | POA: Diagnosis not present

## 2017-09-21 DIAGNOSIS — I110 Hypertensive heart disease with heart failure: Secondary | ICD-10-CM | POA: Diagnosis not present

## 2017-09-21 DIAGNOSIS — I69322 Dysarthria following cerebral infarction: Secondary | ICD-10-CM | POA: Diagnosis not present

## 2017-09-22 DIAGNOSIS — I69322 Dysarthria following cerebral infarction: Secondary | ICD-10-CM | POA: Diagnosis not present

## 2017-09-22 DIAGNOSIS — I110 Hypertensive heart disease with heart failure: Secondary | ICD-10-CM | POA: Diagnosis not present

## 2017-09-22 DIAGNOSIS — I2510 Atherosclerotic heart disease of native coronary artery without angina pectoris: Secondary | ICD-10-CM | POA: Diagnosis not present

## 2017-09-22 DIAGNOSIS — E1142 Type 2 diabetes mellitus with diabetic polyneuropathy: Secondary | ICD-10-CM | POA: Diagnosis not present

## 2017-09-22 DIAGNOSIS — M10061 Idiopathic gout, right knee: Secondary | ICD-10-CM | POA: Diagnosis not present

## 2017-09-22 DIAGNOSIS — I69354 Hemiplegia and hemiparesis following cerebral infarction affecting left non-dominant side: Secondary | ICD-10-CM | POA: Diagnosis not present

## 2017-09-25 ENCOUNTER — Encounter: Payer: Self-pay | Admitting: Family Medicine

## 2017-09-25 ENCOUNTER — Ambulatory Visit (INDEPENDENT_AMBULATORY_CARE_PROVIDER_SITE_OTHER): Payer: Medicare Other | Admitting: Family Medicine

## 2017-09-25 VITALS — BP 127/59 | HR 82 | Ht 63.0 in

## 2017-09-25 DIAGNOSIS — Z794 Long term (current) use of insulin: Secondary | ICD-10-CM

## 2017-09-25 DIAGNOSIS — I10 Essential (primary) hypertension: Secondary | ICD-10-CM

## 2017-09-25 DIAGNOSIS — Z8673 Personal history of transient ischemic attack (TIA), and cerebral infarction without residual deficits: Secondary | ICD-10-CM | POA: Diagnosis not present

## 2017-09-25 DIAGNOSIS — E118 Type 2 diabetes mellitus with unspecified complications: Secondary | ICD-10-CM

## 2017-09-25 DIAGNOSIS — I639 Cerebral infarction, unspecified: Secondary | ICD-10-CM | POA: Diagnosis not present

## 2017-09-25 DIAGNOSIS — I69339 Monoplegia of upper limb following cerebral infarction affecting unspecified side: Secondary | ICD-10-CM | POA: Diagnosis not present

## 2017-09-25 MED ORDER — AMBULATORY NON FORMULARY MEDICATION: 0 refills | Status: DC

## 2017-09-25 NOTE — Patient Instructions (Signed)
Increase your metformin to 1000mg  twice a day.

## 2017-09-25 NOTE — Progress Notes (Signed)
Subjective:    CC: Hospital F/U  HPI:  82 year old male is here today for hospital follow-up.  Reviewed the notes from admission to [facility] long.  He was admitted to the hospital on May 29 for cerebral infarction and then discharged to a rehab facility.  Unfortunately he experienced a right posterior limb internal capsule infarction.  He also has a history of coronary artery disease as well as diabetes.  Echocardiogram showed a Eiad to EF of 60% with grade 1 diastolic dysfunction.  He was started on aspirin and Plavix for 3 weeks and then switch to aspirin alone.  He also had residual dysphasia and was placed on nectar thick liquid diet.  He was able to gradually advance his diet to regular consistency.  He is getting a little bit of motion back to his fingers in his left hand and can just barely move his wrist but is unable to move the arm completely.  In fact he describes it as "dead weight".  Also his left leg is very weak.  He is able to flex his ankle but is unable to lift the leg or knee and if he tries to stand he can only bear weight on it for about 4 5 seconds and then it gives out.  He is currently in a wheelchair and is now getting home physical therapy and Occupational Therapy.  They are checking his blood pressure and he reports that these have been great.  He is been eating about twice a day and has not had any hypoglycemic events.  He and his family are interested in a motorized wheelchair.  Reports that his blood sugars have been a little bit more elevated than usual.  They have been running close to 200 since being home.  He decreased his metformin down to 500 while he was in the hospital but he was also on a thick liquid diet at that time and is now eating a normal diet.  Hypertension- Pt denies chest pain, SOB, dizziness, or heart palpitations.  Taking meds as directed w/o problems.  Denies medication side effects.      Past medical history, Surgical history, Family history not  pertinant except as noted below, Social history, Allergies, and medications have been entered into the medical record, reviewed, and corrections made.   Review of Systems: No fevers, chills, night sweats, weight loss, chest pain, or shortness of breath.   Objective:    General: Well Developed, well nourished, and in no acute distress.  Neuro: Alert and oriented x3, extra-ocular muscles intact, sensation grossly intact.  HEENT: Normocephalic, atraumatic  Skin: Warm and dry, no rashes. Cardiac: Regular rate and rhythm, no murmurs rubs or gallops, no lower extremity edema.  Respiratory: Clear to auscultation bilaterally. Not using accessory muscles, speaking in full sentences.   Impression and Recommendations:    Left lower leg weakness -significant weakness and paralysis of the left lower leg that he is able to flex his ankle.  I do think he would be a candidate for a motorized wheelchair and encouraged the family to contact 1 of the mobility companies and we can certainly do an assessment and fill out paperwork to see if he would qualify under current Medicare guidelines.  Left arm weakness/paralysis -continue home PT and OT.  He is getting some motion of the fingers whereas previously he was getting done so hopefully he will continue to make some progress.  Diabetes-increase metformin to thousand milligrams twice a day.  His daughter who is  here with him today says that they have plenty at home.  HTN - Well controlled. Continue current regimen. Follow up in  3 months.

## 2017-09-26 ENCOUNTER — Ambulatory Visit (HOSPITAL_BASED_OUTPATIENT_CLINIC_OR_DEPARTMENT_OTHER): Payer: Medicare Other | Admitting: Physical Medicine & Rehabilitation

## 2017-09-26 ENCOUNTER — Encounter: Payer: Medicare Other | Attending: Physical Medicine & Rehabilitation

## 2017-09-26 ENCOUNTER — Encounter: Payer: Self-pay | Admitting: Physical Medicine & Rehabilitation

## 2017-09-26 ENCOUNTER — Other Ambulatory Visit: Payer: Self-pay

## 2017-09-26 VITALS — BP 124/70 | HR 75 | Ht 63.0 in | Wt 184.0 lb

## 2017-09-26 DIAGNOSIS — E785 Hyperlipidemia, unspecified: Secondary | ICD-10-CM | POA: Diagnosis not present

## 2017-09-26 DIAGNOSIS — I110 Hypertensive heart disease with heart failure: Secondary | ICD-10-CM | POA: Insufficient documentation

## 2017-09-26 DIAGNOSIS — I69354 Hemiplegia and hemiparesis following cerebral infarction affecting left non-dominant side: Secondary | ICD-10-CM

## 2017-09-26 DIAGNOSIS — Z87891 Personal history of nicotine dependence: Secondary | ICD-10-CM | POA: Insufficient documentation

## 2017-09-26 DIAGNOSIS — I5032 Chronic diastolic (congestive) heart failure: Secondary | ICD-10-CM | POA: Diagnosis not present

## 2017-09-26 DIAGNOSIS — I6523 Occlusion and stenosis of bilateral carotid arteries: Secondary | ICD-10-CM | POA: Diagnosis not present

## 2017-09-26 DIAGNOSIS — Z951 Presence of aortocoronary bypass graft: Secondary | ICD-10-CM | POA: Insufficient documentation

## 2017-09-26 DIAGNOSIS — G40909 Epilepsy, unspecified, not intractable, without status epilepticus: Secondary | ICD-10-CM | POA: Insufficient documentation

## 2017-09-26 DIAGNOSIS — E1151 Type 2 diabetes mellitus with diabetic peripheral angiopathy without gangrene: Secondary | ICD-10-CM | POA: Diagnosis not present

## 2017-09-26 DIAGNOSIS — Z7982 Long term (current) use of aspirin: Secondary | ICD-10-CM | POA: Diagnosis not present

## 2017-09-26 DIAGNOSIS — Z79899 Other long term (current) drug therapy: Secondary | ICD-10-CM | POA: Diagnosis not present

## 2017-09-26 DIAGNOSIS — M10061 Idiopathic gout, right knee: Secondary | ICD-10-CM | POA: Diagnosis not present

## 2017-09-26 DIAGNOSIS — I69322 Dysarthria following cerebral infarction: Secondary | ICD-10-CM | POA: Diagnosis not present

## 2017-09-26 DIAGNOSIS — I2510 Atherosclerotic heart disease of native coronary artery without angina pectoris: Secondary | ICD-10-CM | POA: Diagnosis not present

## 2017-09-26 DIAGNOSIS — E1142 Type 2 diabetes mellitus with diabetic polyneuropathy: Secondary | ICD-10-CM | POA: Diagnosis not present

## 2017-09-26 DIAGNOSIS — Z794 Long term (current) use of insulin: Secondary | ICD-10-CM | POA: Diagnosis not present

## 2017-09-26 NOTE — Patient Instructions (Signed)

## 2017-09-26 NOTE — Progress Notes (Signed)
Subjective:    Patient ID: Fred [name], male    DOB: [birth year], 82 y.o.   MRN: [id number] 82 year old right-handed male with history of alcohol use, hypertension, seizure disorder as well as CVA, CAD with CABG, diastolic congestive heart failure.  Lives with spouse, independent prior to admission.  Presented  08/09/2017 with left-sided weakness, facial droop, slurred speech.  Cranial CT scan negative.  CT angiogram of head and neck showed mild less than 50% proximal right ICA stenosis and moderate to severe 70% proximal left ICA stenosis.  MRI showed  subcentimeter acute versus early subacute infarction within the right posterior limb of internal capsule.  Echocardiogram with ejection fraction of 63%, grade I diastolic dysfunction.  Maintained on aspirin and Plavix x3 weeks, then aspirin alone.     HPI D/c from CIR 7/3, HHPT, OT, RN Slid off of  Commode once and once getting out of bed.  Denies injury with this Dressing and bathing with assist Hasn't tried ambulating with PT through [facility] yet. Requiring min to mod assist for transfers upon discharge from rehab. Required max assist for ambulation 15 feet upon discharge from rehab  Seen by primary care last visit Pain Inventory Average Pain 2 Pain Right Now 0 My pain is no pain  In the last 24 hours, has pain interfered with the following? General activity 0 Relation with others 0 Enjoyment of life 4 What TIME of day is your pain at its worst? no pain Sleep (in general) Fair  Pain is worse with: walking Pain improves with: no pain Relief from Meds: no pain  Mobility walk with assistance how many minutes can you walk? none do you drive?  no use a wheelchair  Function retired  Neuro/Psych trouble walking  Prior Studies Any changes since last visit?  no  Physicians involved in your care Any changes since last visit?  no   Family History  Problem Relation Age of Onset  . Epilepsy Father   . Stroke Mother   .  Lung cancer Unknown    Social History   Socioeconomic History  . Marital status: Married    Spouse name: Jim Lundin  . Number of children: 3  . Years of education: Not on file  . Highest education level: Not on file  Occupational History  . 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 Financial resource strain: Not on file  . Food insecurity:    Worry: Not on file    Inability: Not on file  . Transportation needs:    Medical: Not on file    Non-medical: Not on file  Tobacco Use  . Smoking status: Former Smoker    Packs/day: 1.00    Years: 22.00    Pack years: 22.00    Types: Cigarettes    Last attempt to quit: 03/14/1977    Years since quitting: 40.5  . Smokeless tobacco: Never Used  Substance and Sexual Activity  . Alcohol use: Yes  . Drug use: No  . Sexual activity: Never  Lifestyle  . Physical activity:    Days per week: Not on file    Minutes per session: Not on file  . Stress: Not on file  Relationships  . Social connections:    Talks on phone: Not on file    Gets together: Not on file    Attends religious service: Not on file    Active member of club or  organization: Not on file    Attends meetings of clubs or organizations: Not on file    Relationship status: Not on file  Other Topics Concern  . Not on file  Social History Narrative   3 caffeinated drinks per day. Mostly coffee. He walks 15 minutes 3 days per week. Quit smoking in 1978.      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    Past Surgical History:  Procedure Laterality Date  . CATARACT EXTRACTION W/ INTRAOCULAR LENS  IMPLANT, BILATERAL  06/2013   Dr. [name]  . CORONARY ARTERY BYPASS GRAFT  02/13/2004   4 vesel  . removal of sebaceous cyst from neck  08/2007   Dr. [name]  . TONSILLECTOMY     Past Medical History:  Diagnosis Date  .  Atrial fibrillation ([place])    Post operative  . CAD (coronary artery disease)    s/p cabg  . CHF (congestive heart failure) ([place])   . Diverticulosis   . DJD (degenerative joint disease)   . DM (diabetes mellitus) ([place])   . History of anemia   . History of stroke   . Hyperlipidemia   . Hypertension   . 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 Peripheral vascular disease ([place])   . Polyneuropathy, diabetic ([place])   . Seizure disorder, complex partial ([place])   . Syncope and collapse    BP 124/70   Pulse 75   Ht 5\' 3"  (1.6 m)   Wt 184 lb (83.5 kg)   SpO2 96%   BMI 32.59 kg/m   Opioid Risk Score:   Fall Risk Score:  `1  Depression screen PHQ 2/9  Depression screen [facility] 2/9 09/26/2017 09/25/2017 02/08/2017 05/26/2016 05/05/2016 12/22/2015 12/18/2014  Decreased Interest 0 1 0 0 0 0 0  Down, Depressed, Hopeless 2 1 0 0 0 0 0  PHQ - 2 Score 2 2 0 0 0 0 0  Altered sleeping 2 0 - - - - -  Tired, decreased energy 2 2 - - - - -  Change in appetite 2 3 - - - - -  Feeling bad or failure about yourself  0 0 - - - - -  Trouble concentrating 0 0 - - - - -  Moving slowly or fidgety/restless 0 0 - - - - -  Suicidal thoughts 0 0 - - - - -  PHQ-9 Score 8 7 - - - - -  Difficult doing work/chores Somewhat difficult Somewhat difficult - - - - -  Some recent data might be hidden   Review of Systems  Constitutional: Negative.   HENT: Negative.   Eyes: Negative.   Respiratory: Negative.   Cardiovascular: Negative.   Gastrointestinal: Negative.   Endocrine: Negative.   Genitourinary: Negative.   Musculoskeletal: Negative.   Skin: Negative.   Allergic/Immunologic: Negative.   Neurological: Negative.   Hematological: Negative.   Psychiatric/Behavioral: Negative.   All other systems reviewed and are negative.      Objective:   Physical Exam  Constitutional: He appears well-developed and well-nourished.  HENT:  Head: Normocephalic and atraumatic.  Lungs are clear Heart  regular rate and rhythm no rubs murmurs or extra sounds Abdomen positive bowel sounds soft nontender palpation Left upper extremity has trace wrist extension 0 finger extension to minus finger flexion to minus elbow flexion trace elbow extension 0  at the deltoid Left lower extremity to minus hip knee extensor synergy.  To minus isolated quad 0 at the ankle plantar flexors and dorsiflexors. Sensation intact to light touch upper and lower limbs bilaterally.         Assessment & Plan:  1.  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  He is regained some wrist extension and isolated quad since discharge from the hospital.  Overall functional status he is not making rapid progress.  He will continue with home health and follow-up with me in 1 month.  Will consider home health transition to outpatient at that time.  2.  Secondary stroke Prophylaxis continue aspirin for small vessel disease follow-up with neurology  3.  Diabetes, follow-up with primary care, metformin dose adjusted.

## 2017-09-27 ENCOUNTER — Other Ambulatory Visit: Payer: Self-pay

## 2017-09-27 DIAGNOSIS — M10061 Idiopathic gout, right knee: Secondary | ICD-10-CM | POA: Diagnosis not present

## 2017-09-27 DIAGNOSIS — E1142 Type 2 diabetes mellitus with diabetic polyneuropathy: Secondary | ICD-10-CM | POA: Diagnosis not present

## 2017-09-27 DIAGNOSIS — I2510 Atherosclerotic heart disease of native coronary artery without angina pectoris: Secondary | ICD-10-CM | POA: Diagnosis not present

## 2017-09-27 DIAGNOSIS — I69322 Dysarthria following cerebral infarction: Secondary | ICD-10-CM | POA: Diagnosis not present

## 2017-09-27 DIAGNOSIS — I69354 Hemiplegia and hemiparesis following cerebral infarction affecting left non-dominant side: Secondary | ICD-10-CM | POA: Diagnosis not present

## 2017-09-27 DIAGNOSIS — I110 Hypertensive heart disease with heart failure: Secondary | ICD-10-CM | POA: Diagnosis not present

## 2017-09-27 NOTE — Patient Outreach (Signed)
[facility]) Care Management  09/27/2017  Fred [name] Sep 23, 1935 [id number]   EMMI- Stroke RED ON EMMI ALERT Day # 13 Date: 09/27/17 Red Alert Reason: Feeling worse overall? Yes.   Outreach attempt # 1 spoke with spouse who is DPR. She is able to verify HIPAA.  Discussed with her reason for call.  She reports that patient has done well since being home and does not complain about anything.  Patient recently saw physician and metformin was increased.  Wife reports that his sugars were up and now they are coming back down.   Discussed with wife [facility] services and how we could support patient.  She declines services right now but agrees to receive letter and brochure.     Plan: RN CM will send letter and brochure and close case.    Jone Baseman, RN, MSN [facility] Care Management Care Management Coordinator Direct Line [phone] Toll Free: [phone]  Fax: [phone]

## 2017-09-28 DIAGNOSIS — M10061 Idiopathic gout, right knee: Secondary | ICD-10-CM | POA: Diagnosis not present

## 2017-09-28 DIAGNOSIS — I2510 Atherosclerotic heart disease of native coronary artery without angina pectoris: Secondary | ICD-10-CM | POA: Diagnosis not present

## 2017-09-28 DIAGNOSIS — I69322 Dysarthria following cerebral infarction: Secondary | ICD-10-CM | POA: Diagnosis not present

## 2017-09-28 DIAGNOSIS — I69354 Hemiplegia and hemiparesis following cerebral infarction affecting left non-dominant side: Secondary | ICD-10-CM | POA: Diagnosis not present

## 2017-09-28 DIAGNOSIS — I110 Hypertensive heart disease with heart failure: Secondary | ICD-10-CM | POA: Diagnosis not present

## 2017-09-28 DIAGNOSIS — E1142 Type 2 diabetes mellitus with diabetic polyneuropathy: Secondary | ICD-10-CM | POA: Diagnosis not present

## 2017-09-29 DIAGNOSIS — I69354 Hemiplegia and hemiparesis following cerebral infarction affecting left non-dominant side: Secondary | ICD-10-CM | POA: Diagnosis not present

## 2017-09-29 DIAGNOSIS — I110 Hypertensive heart disease with heart failure: Secondary | ICD-10-CM | POA: Diagnosis not present

## 2017-09-29 DIAGNOSIS — I739 Peripheral vascular disease, unspecified: Secondary | ICD-10-CM | POA: Diagnosis not present

## 2017-09-29 DIAGNOSIS — I2510 Atherosclerotic heart disease of native coronary artery without angina pectoris: Secondary | ICD-10-CM | POA: Diagnosis not present

## 2017-09-29 DIAGNOSIS — M170 Bilateral primary osteoarthritis of knee: Secondary | ICD-10-CM | POA: Diagnosis not present

## 2017-09-29 DIAGNOSIS — Z951 Presence of aortocoronary bypass graft: Secondary | ICD-10-CM

## 2017-09-29 DIAGNOSIS — E785 Hyperlipidemia, unspecified: Secondary | ICD-10-CM

## 2017-09-29 DIAGNOSIS — G40009 Localization-related (focal) (partial) idiopathic epilepsy and epileptic syndromes with seizures of localized onset, not intractable, without status epilepticus: Secondary | ICD-10-CM | POA: Diagnosis not present

## 2017-09-29 DIAGNOSIS — I5032 Chronic diastolic (congestive) heart failure: Secondary | ICD-10-CM | POA: Diagnosis not present

## 2017-09-29 DIAGNOSIS — Z794 Long term (current) use of insulin: Secondary | ICD-10-CM

## 2017-09-29 DIAGNOSIS — D649 Anemia, unspecified: Secondary | ICD-10-CM | POA: Diagnosis not present

## 2017-09-29 DIAGNOSIS — I480 Paroxysmal atrial fibrillation: Secondary | ICD-10-CM

## 2017-09-29 DIAGNOSIS — E1142 Type 2 diabetes mellitus with diabetic polyneuropathy: Secondary | ICD-10-CM | POA: Diagnosis not present

## 2017-09-29 DIAGNOSIS — I69322 Dysarthria following cerebral infarction: Secondary | ICD-10-CM | POA: Diagnosis not present

## 2017-09-29 DIAGNOSIS — R1310 Dysphagia, unspecified: Secondary | ICD-10-CM | POA: Diagnosis not present

## 2017-09-29 DIAGNOSIS — M10061 Idiopathic gout, right knee: Secondary | ICD-10-CM | POA: Diagnosis not present

## 2017-10-02 ENCOUNTER — Telehealth: Payer: Self-pay

## 2017-10-02 DIAGNOSIS — I69322 Dysarthria following cerebral infarction: Secondary | ICD-10-CM | POA: Diagnosis not present

## 2017-10-02 DIAGNOSIS — E1142 Type 2 diabetes mellitus with diabetic polyneuropathy: Secondary | ICD-10-CM | POA: Diagnosis not present

## 2017-10-02 DIAGNOSIS — I69354 Hemiplegia and hemiparesis following cerebral infarction affecting left non-dominant side: Secondary | ICD-10-CM | POA: Diagnosis not present

## 2017-10-02 DIAGNOSIS — I2510 Atherosclerotic heart disease of native coronary artery without angina pectoris: Secondary | ICD-10-CM | POA: Diagnosis not present

## 2017-10-02 DIAGNOSIS — I110 Hypertensive heart disease with heart failure: Secondary | ICD-10-CM | POA: Diagnosis not present

## 2017-10-02 DIAGNOSIS — M10061 Idiopathic gout, right knee: Secondary | ICD-10-CM | POA: Diagnosis not present

## 2017-10-02 DIAGNOSIS — R262 Difficulty in walking, not elsewhere classified: Secondary | ICD-10-CM

## 2017-10-02 MED ORDER — AMBULATORY NON FORMULARY MEDICATION: 0 refills | Status: DC

## 2017-10-02 NOTE — Telephone Encounter (Signed)
Rx printed placed on Angela's desk.

## 2017-10-02 NOTE — Telephone Encounter (Signed)
Johnny's daughter called for an order for a Motorized wheel chair. Order needs to be faxed to [place] [phone]

## 2017-10-03 NOTE — Telephone Encounter (Signed)
Faxed to [facility].

## 2017-10-03 NOTE — Telephone Encounter (Signed)
Patient's daughter advised.  

## 2017-10-04 ENCOUNTER — Telehealth: Payer: Self-pay | Admitting: Family Medicine

## 2017-10-04 DIAGNOSIS — I69354 Hemiplegia and hemiparesis following cerebral infarction affecting left non-dominant side: Secondary | ICD-10-CM | POA: Diagnosis not present

## 2017-10-04 DIAGNOSIS — I2510 Atherosclerotic heart disease of native coronary artery without angina pectoris: Secondary | ICD-10-CM | POA: Diagnosis not present

## 2017-10-04 DIAGNOSIS — M10061 Idiopathic gout, right knee: Secondary | ICD-10-CM | POA: Diagnosis not present

## 2017-10-04 DIAGNOSIS — I69322 Dysarthria following cerebral infarction: Secondary | ICD-10-CM | POA: Diagnosis not present

## 2017-10-04 DIAGNOSIS — I110 Hypertensive heart disease with heart failure: Secondary | ICD-10-CM | POA: Diagnosis not present

## 2017-10-04 DIAGNOSIS — E1142 Type 2 diabetes mellitus with diabetic polyneuropathy: Secondary | ICD-10-CM | POA: Diagnosis not present

## 2017-10-04 NOTE — Telephone Encounter (Signed)
Spoke w/[place] she transferred me to [place]  in [facility] give VO for PT/OT she informed me that this were given by Caren Griffins? She took the VO for PT/OT for him again.Elouise Munroe, CMA   Cam[place]a asked that the order for the wheelchair be re faxed. I resent this. Confirmation received.Audelia Hives [place]

## 2017-10-04 NOTE — Telephone Encounter (Signed)
Magda Paganini from  [place] called in regards to [facility].  He would like to start outpatient therapy. This is for physical and occupational therapy. If Dr.Metheney agrees to this, can the orders be faxed to [place]. The fax number is [phone]. Thank you

## 2017-10-05 ENCOUNTER — Telehealth: Payer: Self-pay | Admitting: Family Medicine

## 2017-10-05 NOTE — Telephone Encounter (Signed)
Morey Hummingbird from [place] called to state that she is needing a verbal order from Dr.Metheney's nurse. This is for home health aide. Her number is [phone]

## 2017-10-05 NOTE — Telephone Encounter (Signed)
Called [place], verbal given for home health aid to assist with bathing, ADLs 2x week for 4 weeks.

## 2017-10-05 NOTE — Telephone Encounter (Signed)
Agree with documentation as above.   Corinda Ammon, MD  

## 2017-10-06 DIAGNOSIS — I2510 Atherosclerotic heart disease of native coronary artery without angina pectoris: Secondary | ICD-10-CM | POA: Diagnosis not present

## 2017-10-06 DIAGNOSIS — M10061 Idiopathic gout, right knee: Secondary | ICD-10-CM | POA: Diagnosis not present

## 2017-10-06 DIAGNOSIS — I69354 Hemiplegia and hemiparesis following cerebral infarction affecting left non-dominant side: Secondary | ICD-10-CM | POA: Diagnosis not present

## 2017-10-06 DIAGNOSIS — I110 Hypertensive heart disease with heart failure: Secondary | ICD-10-CM | POA: Diagnosis not present

## 2017-10-06 DIAGNOSIS — I69322 Dysarthria following cerebral infarction: Secondary | ICD-10-CM | POA: Diagnosis not present

## 2017-10-06 DIAGNOSIS — E1142 Type 2 diabetes mellitus with diabetic polyneuropathy: Secondary | ICD-10-CM | POA: Diagnosis not present

## 2017-10-09 ENCOUNTER — Telehealth: Payer: Self-pay

## 2017-10-09 DIAGNOSIS — I110 Hypertensive heart disease with heart failure: Secondary | ICD-10-CM | POA: Diagnosis not present

## 2017-10-09 DIAGNOSIS — I69354 Hemiplegia and hemiparesis following cerebral infarction affecting left non-dominant side: Secondary | ICD-10-CM | POA: Diagnosis not present

## 2017-10-09 DIAGNOSIS — E1142 Type 2 diabetes mellitus with diabetic polyneuropathy: Secondary | ICD-10-CM | POA: Diagnosis not present

## 2017-10-09 DIAGNOSIS — M10061 Idiopathic gout, right knee: Secondary | ICD-10-CM | POA: Diagnosis not present

## 2017-10-09 DIAGNOSIS — I69322 Dysarthria following cerebral infarction: Secondary | ICD-10-CM | POA: Diagnosis not present

## 2017-10-09 DIAGNOSIS — I2510 Atherosclerotic heart disease of native coronary artery without angina pectoris: Secondary | ICD-10-CM | POA: Diagnosis not present

## 2017-10-09 NOTE — Telephone Encounter (Signed)
Pt's daughter called requesting update on wheel chair.  I called and spoke with Shanon Brow at [facility] and he advised me that they have received RX for wheel chair and that the referral team is working on getting this approved with insurance. They state this is very legthy process and that it could take up to a couple months.  Called and advised pt of update- no further needs at this time.

## 2017-10-10 DIAGNOSIS — E1142 Type 2 diabetes mellitus with diabetic polyneuropathy: Secondary | ICD-10-CM | POA: Diagnosis not present

## 2017-10-10 DIAGNOSIS — I110 Hypertensive heart disease with heart failure: Secondary | ICD-10-CM | POA: Diagnosis not present

## 2017-10-10 DIAGNOSIS — I69322 Dysarthria following cerebral infarction: Secondary | ICD-10-CM | POA: Diagnosis not present

## 2017-10-10 DIAGNOSIS — I2510 Atherosclerotic heart disease of native coronary artery without angina pectoris: Secondary | ICD-10-CM | POA: Diagnosis not present

## 2017-10-10 DIAGNOSIS — I69354 Hemiplegia and hemiparesis following cerebral infarction affecting left non-dominant side: Secondary | ICD-10-CM | POA: Diagnosis not present

## 2017-10-10 DIAGNOSIS — M10061 Idiopathic gout, right knee: Secondary | ICD-10-CM | POA: Diagnosis not present

## 2017-10-12 DIAGNOSIS — I69354 Hemiplegia and hemiparesis following cerebral infarction affecting left non-dominant side: Secondary | ICD-10-CM | POA: Diagnosis not present

## 2017-10-12 DIAGNOSIS — E1142 Type 2 diabetes mellitus with diabetic polyneuropathy: Secondary | ICD-10-CM | POA: Diagnosis not present

## 2017-10-12 DIAGNOSIS — I110 Hypertensive heart disease with heart failure: Secondary | ICD-10-CM | POA: Diagnosis not present

## 2017-10-12 DIAGNOSIS — I69322 Dysarthria following cerebral infarction: Secondary | ICD-10-CM | POA: Diagnosis not present

## 2017-10-12 DIAGNOSIS — I2510 Atherosclerotic heart disease of native coronary artery without angina pectoris: Secondary | ICD-10-CM | POA: Diagnosis not present

## 2017-10-12 DIAGNOSIS — M10061 Idiopathic gout, right knee: Secondary | ICD-10-CM | POA: Diagnosis not present

## 2017-10-13 DIAGNOSIS — M10061 Idiopathic gout, right knee: Secondary | ICD-10-CM | POA: Diagnosis not present

## 2017-10-13 DIAGNOSIS — I2510 Atherosclerotic heart disease of native coronary artery without angina pectoris: Secondary | ICD-10-CM | POA: Diagnosis not present

## 2017-10-13 DIAGNOSIS — E1142 Type 2 diabetes mellitus with diabetic polyneuropathy: Secondary | ICD-10-CM | POA: Diagnosis not present

## 2017-10-13 DIAGNOSIS — I69354 Hemiplegia and hemiparesis following cerebral infarction affecting left non-dominant side: Secondary | ICD-10-CM | POA: Diagnosis not present

## 2017-10-13 DIAGNOSIS — I110 Hypertensive heart disease with heart failure: Secondary | ICD-10-CM | POA: Diagnosis not present

## 2017-10-13 DIAGNOSIS — I69322 Dysarthria following cerebral infarction: Secondary | ICD-10-CM | POA: Diagnosis not present

## 2017-10-16 DIAGNOSIS — M10061 Idiopathic gout, right knee: Secondary | ICD-10-CM | POA: Diagnosis not present

## 2017-10-16 DIAGNOSIS — I69354 Hemiplegia and hemiparesis following cerebral infarction affecting left non-dominant side: Secondary | ICD-10-CM | POA: Diagnosis not present

## 2017-10-16 DIAGNOSIS — I2510 Atherosclerotic heart disease of native coronary artery without angina pectoris: Secondary | ICD-10-CM | POA: Diagnosis not present

## 2017-10-16 DIAGNOSIS — E1142 Type 2 diabetes mellitus with diabetic polyneuropathy: Secondary | ICD-10-CM | POA: Diagnosis not present

## 2017-10-16 DIAGNOSIS — I69322 Dysarthria following cerebral infarction: Secondary | ICD-10-CM | POA: Diagnosis not present

## 2017-10-16 DIAGNOSIS — I110 Hypertensive heart disease with heart failure: Secondary | ICD-10-CM | POA: Diagnosis not present

## 2017-10-17 DIAGNOSIS — I110 Hypertensive heart disease with heart failure: Secondary | ICD-10-CM | POA: Diagnosis not present

## 2017-10-17 DIAGNOSIS — I69322 Dysarthria following cerebral infarction: Secondary | ICD-10-CM | POA: Diagnosis not present

## 2017-10-17 DIAGNOSIS — E1142 Type 2 diabetes mellitus with diabetic polyneuropathy: Secondary | ICD-10-CM | POA: Diagnosis not present

## 2017-10-17 DIAGNOSIS — I69354 Hemiplegia and hemiparesis following cerebral infarction affecting left non-dominant side: Secondary | ICD-10-CM | POA: Diagnosis not present

## 2017-10-17 DIAGNOSIS — M10061 Idiopathic gout, right knee: Secondary | ICD-10-CM | POA: Diagnosis not present

## 2017-10-17 DIAGNOSIS — I2510 Atherosclerotic heart disease of native coronary artery without angina pectoris: Secondary | ICD-10-CM | POA: Diagnosis not present

## 2017-10-18 DIAGNOSIS — M10061 Idiopathic gout, right knee: Secondary | ICD-10-CM | POA: Diagnosis not present

## 2017-10-18 DIAGNOSIS — I69354 Hemiplegia and hemiparesis following cerebral infarction affecting left non-dominant side: Secondary | ICD-10-CM | POA: Diagnosis not present

## 2017-10-18 DIAGNOSIS — I69322 Dysarthria following cerebral infarction: Secondary | ICD-10-CM | POA: Diagnosis not present

## 2017-10-18 DIAGNOSIS — I110 Hypertensive heart disease with heart failure: Secondary | ICD-10-CM | POA: Diagnosis not present

## 2017-10-18 DIAGNOSIS — E1142 Type 2 diabetes mellitus with diabetic polyneuropathy: Secondary | ICD-10-CM | POA: Diagnosis not present

## 2017-10-18 DIAGNOSIS — I2510 Atherosclerotic heart disease of native coronary artery without angina pectoris: Secondary | ICD-10-CM | POA: Diagnosis not present

## 2017-10-19 DIAGNOSIS — M10061 Idiopathic gout, right knee: Secondary | ICD-10-CM | POA: Diagnosis not present

## 2017-10-19 DIAGNOSIS — I69322 Dysarthria following cerebral infarction: Secondary | ICD-10-CM | POA: Diagnosis not present

## 2017-10-19 DIAGNOSIS — I69354 Hemiplegia and hemiparesis following cerebral infarction affecting left non-dominant side: Secondary | ICD-10-CM | POA: Diagnosis not present

## 2017-10-19 DIAGNOSIS — E1142 Type 2 diabetes mellitus with diabetic polyneuropathy: Secondary | ICD-10-CM | POA: Diagnosis not present

## 2017-10-19 DIAGNOSIS — I110 Hypertensive heart disease with heart failure: Secondary | ICD-10-CM | POA: Diagnosis not present

## 2017-10-19 DIAGNOSIS — I2510 Atherosclerotic heart disease of native coronary artery without angina pectoris: Secondary | ICD-10-CM | POA: Diagnosis not present

## 2017-10-23 DIAGNOSIS — I69354 Hemiplegia and hemiparesis following cerebral infarction affecting left non-dominant side: Secondary | ICD-10-CM | POA: Diagnosis not present

## 2017-10-23 DIAGNOSIS — M10061 Idiopathic gout, right knee: Secondary | ICD-10-CM | POA: Diagnosis not present

## 2017-10-23 DIAGNOSIS — E1142 Type 2 diabetes mellitus with diabetic polyneuropathy: Secondary | ICD-10-CM | POA: Diagnosis not present

## 2017-10-23 DIAGNOSIS — I2510 Atherosclerotic heart disease of native coronary artery without angina pectoris: Secondary | ICD-10-CM | POA: Diagnosis not present

## 2017-10-23 DIAGNOSIS — I110 Hypertensive heart disease with heart failure: Secondary | ICD-10-CM | POA: Diagnosis not present

## 2017-10-23 DIAGNOSIS — I69322 Dysarthria following cerebral infarction: Secondary | ICD-10-CM | POA: Diagnosis not present

## 2017-10-24 DIAGNOSIS — M10061 Idiopathic gout, right knee: Secondary | ICD-10-CM | POA: Diagnosis not present

## 2017-10-24 DIAGNOSIS — I2510 Atherosclerotic heart disease of native coronary artery without angina pectoris: Secondary | ICD-10-CM | POA: Diagnosis not present

## 2017-10-24 DIAGNOSIS — I110 Hypertensive heart disease with heart failure: Secondary | ICD-10-CM | POA: Diagnosis not present

## 2017-10-24 DIAGNOSIS — I69322 Dysarthria following cerebral infarction: Secondary | ICD-10-CM | POA: Diagnosis not present

## 2017-10-24 DIAGNOSIS — I69354 Hemiplegia and hemiparesis following cerebral infarction affecting left non-dominant side: Secondary | ICD-10-CM | POA: Diagnosis not present

## 2017-10-24 DIAGNOSIS — E1142 Type 2 diabetes mellitus with diabetic polyneuropathy: Secondary | ICD-10-CM | POA: Diagnosis not present

## 2017-10-26 ENCOUNTER — Ambulatory Visit (HOSPITAL_BASED_OUTPATIENT_CLINIC_OR_DEPARTMENT_OTHER): Payer: Medicare Other | Admitting: Physical Medicine & Rehabilitation

## 2017-10-26 ENCOUNTER — Encounter: Payer: Self-pay | Admitting: Physical Medicine & Rehabilitation

## 2017-10-26 ENCOUNTER — Encounter: Payer: Medicare Other | Attending: Physical Medicine & Rehabilitation

## 2017-10-26 VITALS — BP 105/56 | HR 68 | Resp 14 | Ht 63.0 in | Wt 184.0 lb

## 2017-10-26 DIAGNOSIS — I69354 Hemiplegia and hemiparesis following cerebral infarction affecting left non-dominant side: Secondary | ICD-10-CM | POA: Insufficient documentation

## 2017-10-26 DIAGNOSIS — I110 Hypertensive heart disease with heart failure: Secondary | ICD-10-CM | POA: Insufficient documentation

## 2017-10-26 DIAGNOSIS — I639 Cerebral infarction, unspecified: Secondary | ICD-10-CM

## 2017-10-26 DIAGNOSIS — E1142 Type 2 diabetes mellitus with diabetic polyneuropathy: Secondary | ICD-10-CM | POA: Diagnosis not present

## 2017-10-26 DIAGNOSIS — M10061 Idiopathic gout, right knee: Secondary | ICD-10-CM | POA: Diagnosis not present

## 2017-10-26 DIAGNOSIS — Z951 Presence of aortocoronary bypass graft: Secondary | ICD-10-CM | POA: Diagnosis not present

## 2017-10-26 DIAGNOSIS — E1151 Type 2 diabetes mellitus with diabetic peripheral angiopathy without gangrene: Secondary | ICD-10-CM | POA: Insufficient documentation

## 2017-10-26 DIAGNOSIS — I2510 Atherosclerotic heart disease of native coronary artery without angina pectoris: Secondary | ICD-10-CM | POA: Insufficient documentation

## 2017-10-26 DIAGNOSIS — I5032 Chronic diastolic (congestive) heart failure: Secondary | ICD-10-CM | POA: Insufficient documentation

## 2017-10-26 DIAGNOSIS — I69322 Dysarthria following cerebral infarction: Secondary | ICD-10-CM | POA: Diagnosis not present

## 2017-10-26 DIAGNOSIS — Z79899 Other long term (current) drug therapy: Secondary | ICD-10-CM | POA: Diagnosis not present

## 2017-10-26 DIAGNOSIS — Z794 Long term (current) use of insulin: Secondary | ICD-10-CM | POA: Insufficient documentation

## 2017-10-26 DIAGNOSIS — G40909 Epilepsy, unspecified, not intractable, without status epilepticus: Secondary | ICD-10-CM | POA: Insufficient documentation

## 2017-10-26 DIAGNOSIS — Z87891 Personal history of nicotine dependence: Secondary | ICD-10-CM | POA: Insufficient documentation

## 2017-10-26 DIAGNOSIS — Z7982 Long term (current) use of aspirin: Secondary | ICD-10-CM | POA: Insufficient documentation

## 2017-10-26 DIAGNOSIS — I6523 Occlusion and stenosis of bilateral carotid arteries: Secondary | ICD-10-CM | POA: Diagnosis not present

## 2017-10-26 DIAGNOSIS — E785 Hyperlipidemia, unspecified: Secondary | ICD-10-CM | POA: Insufficient documentation

## 2017-10-26 NOTE — Progress Notes (Signed)
Subjective:    Patient ID: Fred [name], male    DOB: 03/16/1935, 82 y.o.   MRN: [id number]  HPI 82 year old male with history of alcohol use, hypertension seizure disorder as well as coronary artery disease status post coronary artery bypass grafting and diastolic CHF who presented on 08/09/2017 with left-sided weakness facial droop and slurred speech.  CT was negative CT angiogram showed less than 50% possible right ICA stenosis there was a severe 70% proximal left ICA stenosis.  MRI showed a subcentimeter infarction at the right posterior limb of internal capsule.  Echocardiogram showed grade 1 diastolic dysfunction but with normal systolic function.  He was maintained on aspirin Plavix and now he is on aspirin alone.  Walking with HHPT Moving LUE better Patient had a fall early on after he was discharged home this was more of a slide out of bed and toilet without injury Pain Inventory Average Pain 0 Pain Right Now 0 My pain is no pain  In the last 24 hours, has pain interfered with the following? General activity 0 Relation with others 0 Enjoyment of life 0 What TIME of day is your pain at its worst? no pain Sleep (in general) Good  Pain is worse with: no pain Pain improves with: no pain Relief from Meds: 0  Mobility use a wheelchair needs help with transfers  Function retired I need assistance with the following:  dressing, bathing, toileting, meal prep and household duties  Neuro/Psych trouble walking  Prior Studies Any changes since last visit?  no  Physicians involved in your care Any changes since last visit?  no   Family History  Problem Relation Age of Onset  . Epilepsy Father   . Stroke Mother   . Lung cancer Unknown    Social History   Socioeconomic History  . Marital status: Married    Spouse name: Tamarion Haymond  . Number of children: 3  . Years of education: Not on file  . Highest education level: Not on file  Occupational History  .  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 Financial resource strain: Not on file  . Food insecurity:    Worry: Not on file    Inability: Not on file  . Transportation needs:    Medical: Not on file    Non-medical: Not on file  Tobacco Use  . Smoking status: Former Smoker    Packs/day: 1.00    Years: 22.00    Pack years: 22.00    Types: Cigarettes    Last attempt to quit: 03/14/1977    Years since quitting: 40.6  . Smokeless tobacco: Never Used  Substance and Sexual Activity  . Alcohol use: Yes  . Drug use: No  . Sexual activity: Never  Lifestyle  . Physical activity:    Days per week: Not on file    Minutes per session: Not on file  . Stress: Not on file  Relationships  . Social connections:    Talks on phone: Not on file    Gets together: Not on file    Attends religious service: Not on file    Active member of club or organization: Not on file    Attends meetings of clubs or organizations: Not on file    Relationship status: Not on file  Other Topics Concern  . Not on file  Social History Narrative   3 caffeinated drinks per day. Mostly  coffee. He walks 15 minutes 3 days per week. Quit smoking in 1978.      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    Past Surgical History:  Procedure Laterality Date  . CATARACT EXTRACTION W/ INTRAOCULAR LENS  IMPLANT, BILATERAL  06/2013   Dr. [name]  . CORONARY ARTERY BYPASS GRAFT  02/13/2004   4 vesel  . removal of sebaceous cyst from neck  08/2007   Dr. [name]  . TONSILLECTOMY     Past Medical History:  Diagnosis Date  . Atrial fibrillation ([place])    Post operative  . CAD (coronary artery disease)    s/p cabg  . CHF (congestive heart failure) ([place])   . Diverticulosis   . DJD (degenerative joint disease)   . DM (diabetes mellitus) ([place])   . History of anemia   . History  of stroke   . Hyperlipidemia   . Hypertension   . 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 Peripheral vascular disease ([place])   . Polyneuropathy, diabetic ([place])   . Seizure disorder, complex partial ([place])   . Syncope and collapse    BP (!) 105/56 (BP Location: Right Arm, Patient Position: Sitting, Cuff Size: Normal)   Pulse 68   Resp 14   Ht 5\' 3"  (1.6 m)   Wt 184 lb (83.5 kg)   SpO2 96%   BMI 32.59 kg/m   Opioid Risk Score:   Fall Risk Score:  `1  Depression screen PHQ 2/9  Depression screen [facility] 2/9 09/26/2017 09/25/2017 02/08/2017 05/26/2016 05/05/2016 12/22/2015 12/18/2014  Decreased Interest 0 1 0 0 0 0 0  Down, Depressed, Hopeless 2 1 0 0 0 0 0  PHQ - 2 Score 2 2 0 0 0 0 0  Altered sleeping 2 0 - - - - -  Tired, decreased energy 2 2 - - - - -  Change in appetite 2 3 - - - - -  Feeling bad or failure about yourself  0 0 - - - - -  Trouble concentrating 0 0 - - - - -  Moving slowly or fidgety/restless 0 0 - - - - -  Suicidal thoughts 0 0 - - - - -  PHQ-9 Score 8 7 - - - - -  Difficult doing work/chores Somewhat difficult Somewhat difficult - - - - -  Some recent data might be hidden    Review of Systems  Constitutional: Negative.   HENT: Negative.   Eyes: Negative.   Respiratory: Negative.   Cardiovascular: Negative.   Gastrointestinal: Negative.   Endocrine: Negative.   Genitourinary: Negative.   Musculoskeletal: Positive for gait problem.  Skin: Negative.   Allergic/Immunologic: Negative.   Hematological: Negative.   Psychiatric/Behavioral: Negative.        Objective:   Physical Exam  Constitutional: He is oriented to person, place, and time. He appears well-developed and well-nourished.  HENT:  Head: Normocephalic and atraumatic.  Neurological: He is alert and oriented to person, place, and time.  Skin: Skin is warm and dry.  Nursing note and vitals reviewed.   Increased Left thumb flexion/ext 3/5 Left elow flex 3- Finger and wrist  flex /ext 2-  2- Left hip add/abd  Standing with 1 person assist he leans heavily toward left side.      Assessment & Plan:  1.  Right internal capsule infarct  with chronic left hemiparesis he is getting some recovery of the left upper extremity as well as left lower extremity however he still does not bear weight very well on the left side. I will see him back in ~2 months Family would like to get PT OT closer to home in the [facility] area.  We discussed neuro rehab twice a week with PT and OT.  They will look into this as well.  Transportation may be an issue.  They will call me if they need outpatient therapy orders. He is continue to make some improvement with his strength and function

## 2017-10-26 NOTE — Patient Instructions (Addendum)
Neuro Rehab

## 2017-10-30 DIAGNOSIS — E1142 Type 2 diabetes mellitus with diabetic polyneuropathy: Secondary | ICD-10-CM | POA: Diagnosis not present

## 2017-10-30 DIAGNOSIS — I69354 Hemiplegia and hemiparesis following cerebral infarction affecting left non-dominant side: Secondary | ICD-10-CM | POA: Diagnosis not present

## 2017-10-30 DIAGNOSIS — I69322 Dysarthria following cerebral infarction: Secondary | ICD-10-CM | POA: Diagnosis not present

## 2017-10-30 DIAGNOSIS — M10061 Idiopathic gout, right knee: Secondary | ICD-10-CM | POA: Diagnosis not present

## 2017-10-30 DIAGNOSIS — I2510 Atherosclerotic heart disease of native coronary artery without angina pectoris: Secondary | ICD-10-CM | POA: Diagnosis not present

## 2017-10-30 DIAGNOSIS — I110 Hypertensive heart disease with heart failure: Secondary | ICD-10-CM | POA: Diagnosis not present

## 2017-10-31 DIAGNOSIS — I110 Hypertensive heart disease with heart failure: Secondary | ICD-10-CM | POA: Diagnosis not present

## 2017-10-31 DIAGNOSIS — M10061 Idiopathic gout, right knee: Secondary | ICD-10-CM | POA: Diagnosis not present

## 2017-10-31 DIAGNOSIS — I2510 Atherosclerotic heart disease of native coronary artery without angina pectoris: Secondary | ICD-10-CM | POA: Diagnosis not present

## 2017-10-31 DIAGNOSIS — I69322 Dysarthria following cerebral infarction: Secondary | ICD-10-CM | POA: Diagnosis not present

## 2017-10-31 DIAGNOSIS — E1142 Type 2 diabetes mellitus with diabetic polyneuropathy: Secondary | ICD-10-CM | POA: Diagnosis not present

## 2017-10-31 DIAGNOSIS — I69354 Hemiplegia and hemiparesis following cerebral infarction affecting left non-dominant side: Secondary | ICD-10-CM | POA: Diagnosis not present

## 2017-11-01 DIAGNOSIS — I2510 Atherosclerotic heart disease of native coronary artery without angina pectoris: Secondary | ICD-10-CM | POA: Diagnosis not present

## 2017-11-01 DIAGNOSIS — I110 Hypertensive heart disease with heart failure: Secondary | ICD-10-CM | POA: Diagnosis not present

## 2017-11-01 DIAGNOSIS — I69322 Dysarthria following cerebral infarction: Secondary | ICD-10-CM | POA: Diagnosis not present

## 2017-11-01 DIAGNOSIS — E1142 Type 2 diabetes mellitus with diabetic polyneuropathy: Secondary | ICD-10-CM | POA: Diagnosis not present

## 2017-11-01 DIAGNOSIS — M10061 Idiopathic gout, right knee: Secondary | ICD-10-CM | POA: Diagnosis not present

## 2017-11-01 DIAGNOSIS — I69354 Hemiplegia and hemiparesis following cerebral infarction affecting left non-dominant side: Secondary | ICD-10-CM | POA: Diagnosis not present

## 2017-11-02 DIAGNOSIS — I69322 Dysarthria following cerebral infarction: Secondary | ICD-10-CM | POA: Diagnosis not present

## 2017-11-02 DIAGNOSIS — E1142 Type 2 diabetes mellitus with diabetic polyneuropathy: Secondary | ICD-10-CM | POA: Diagnosis not present

## 2017-11-02 DIAGNOSIS — I69354 Hemiplegia and hemiparesis following cerebral infarction affecting left non-dominant side: Secondary | ICD-10-CM | POA: Diagnosis not present

## 2017-11-02 DIAGNOSIS — M10061 Idiopathic gout, right knee: Secondary | ICD-10-CM | POA: Diagnosis not present

## 2017-11-02 DIAGNOSIS — I110 Hypertensive heart disease with heart failure: Secondary | ICD-10-CM | POA: Diagnosis not present

## 2017-11-02 DIAGNOSIS — I2510 Atherosclerotic heart disease of native coronary artery without angina pectoris: Secondary | ICD-10-CM | POA: Diagnosis not present

## 2017-11-07 DIAGNOSIS — I2510 Atherosclerotic heart disease of native coronary artery without angina pectoris: Secondary | ICD-10-CM | POA: Diagnosis not present

## 2017-11-07 DIAGNOSIS — E1142 Type 2 diabetes mellitus with diabetic polyneuropathy: Secondary | ICD-10-CM | POA: Diagnosis not present

## 2017-11-07 DIAGNOSIS — I69354 Hemiplegia and hemiparesis following cerebral infarction affecting left non-dominant side: Secondary | ICD-10-CM | POA: Diagnosis not present

## 2017-11-07 DIAGNOSIS — I110 Hypertensive heart disease with heart failure: Secondary | ICD-10-CM | POA: Diagnosis not present

## 2017-11-07 DIAGNOSIS — M10061 Idiopathic gout, right knee: Secondary | ICD-10-CM | POA: Diagnosis not present

## 2017-11-07 DIAGNOSIS — I69322 Dysarthria following cerebral infarction: Secondary | ICD-10-CM | POA: Diagnosis not present

## 2017-11-09 DIAGNOSIS — I2510 Atherosclerotic heart disease of native coronary artery without angina pectoris: Secondary | ICD-10-CM | POA: Diagnosis not present

## 2017-11-09 DIAGNOSIS — M10061 Idiopathic gout, right knee: Secondary | ICD-10-CM | POA: Diagnosis not present

## 2017-11-09 DIAGNOSIS — I69322 Dysarthria following cerebral infarction: Secondary | ICD-10-CM | POA: Diagnosis not present

## 2017-11-09 DIAGNOSIS — I110 Hypertensive heart disease with heart failure: Secondary | ICD-10-CM | POA: Diagnosis not present

## 2017-11-09 DIAGNOSIS — E1142 Type 2 diabetes mellitus with diabetic polyneuropathy: Secondary | ICD-10-CM | POA: Diagnosis not present

## 2017-11-09 DIAGNOSIS — I69354 Hemiplegia and hemiparesis following cerebral infarction affecting left non-dominant side: Secondary | ICD-10-CM | POA: Diagnosis not present

## 2017-11-14 DIAGNOSIS — I69354 Hemiplegia and hemiparesis following cerebral infarction affecting left non-dominant side: Secondary | ICD-10-CM | POA: Diagnosis not present

## 2017-11-14 DIAGNOSIS — R2689 Other abnormalities of gait and mobility: Secondary | ICD-10-CM | POA: Diagnosis not present

## 2017-11-21 ENCOUNTER — Telehealth: Payer: Self-pay | Admitting: *Deleted

## 2017-11-21 DIAGNOSIS — I69354 Hemiplegia and hemiparesis following cerebral infarction affecting left non-dominant side: Secondary | ICD-10-CM

## 2017-11-21 NOTE — Telephone Encounter (Signed)
Patients family left a message stating they were instructed to call Dr. Letta Pate when home physical therapy was completed.  They were told referral would be placed outpatient physical therapy.  They are hoping for the [place] area as 1st choice.

## 2017-11-23 NOTE — Telephone Encounter (Signed)
Need PT and OT, referral to neuro rehab

## 2017-11-27 ENCOUNTER — Ambulatory Visit: Payer: Medicare Other | Admitting: Family Medicine

## 2017-12-05 ENCOUNTER — Encounter: Payer: Self-pay | Admitting: Family Medicine

## 2017-12-05 ENCOUNTER — Ambulatory Visit (HOSPITAL_BASED_OUTPATIENT_CLINIC_OR_DEPARTMENT_OTHER)
Admission: RE | Admit: 2017-12-05 | Discharge: 2017-12-05 | Disposition: A | Discharge status: Home / Self Care | Payer: Medicare Other | Source: Ambulatory Visit | Attending: Family Medicine | Admitting: Family Medicine

## 2017-12-05 ENCOUNTER — Ambulatory Visit (INDEPENDENT_AMBULATORY_CARE_PROVIDER_SITE_OTHER): Payer: Medicare Other | Admitting: Family Medicine

## 2017-12-05 VITALS — BP 106/50 | HR 73

## 2017-12-05 DIAGNOSIS — I69339 Monoplegia of upper limb following cerebral infarction affecting unspecified side: Secondary | ICD-10-CM | POA: Diagnosis not present

## 2017-12-05 DIAGNOSIS — Z23 Encounter for immunization: Secondary | ICD-10-CM

## 2017-12-05 DIAGNOSIS — I10 Essential (primary) hypertension: Secondary | ICD-10-CM | POA: Diagnosis not present

## 2017-12-05 DIAGNOSIS — R29898 Other symptoms and signs involving the musculoskeletal system: Secondary | ICD-10-CM | POA: Diagnosis not present

## 2017-12-05 DIAGNOSIS — Z794 Long term (current) use of insulin: Secondary | ICD-10-CM | POA: Diagnosis not present

## 2017-12-05 DIAGNOSIS — E118 Type 2 diabetes mellitus with unspecified complications: Secondary | ICD-10-CM | POA: Diagnosis not present

## 2017-12-05 DIAGNOSIS — M7989 Other specified soft tissue disorders: Secondary | ICD-10-CM | POA: Diagnosis not present

## 2017-12-05 DIAGNOSIS — R600 Localized edema: Secondary | ICD-10-CM | POA: Diagnosis not present

## 2017-12-05 DIAGNOSIS — I639 Cerebral infarction, unspecified: Secondary | ICD-10-CM | POA: Diagnosis not present

## 2017-12-05 LAB — POCT GLYCOSYLATED HEMOGLOBIN (HGB A1C): HEMOGLOBIN A1C: 6.7 % — AB (ref 4.0–5.6)

## 2017-12-05 NOTE — Progress Notes (Signed)
Subjective:    CC: DM  HPI:  Diabetes - no hypoglycemic events. No wounds or sores that are not healing well. No increased thirst or urination. Checking glucose at home. Taking medications as prescribed without any side effects.  Hypertension- Pt denies chest pain, SOB, dizziness, or heart palpitations.  Taking meds as directed w/o problems.  Denies medication side effects.    Is also here to follow-up after his stroke.  He still has some persistent weakness of his left arm and hand as well as his left leg.  He was doing some home physical therapy but says a therapist that was coming out really was not working very aggressively with him so they are actually going to start outpatient physical therapy.  They have a family member that is going to be able to drive him.  He has has persistent left ankle edema since hospitalization.  He does try to elevate it during the day but says it does not really seem to make a big difference in the swelling.  He denies having any pain in that left lower leg or ankle.  Past medical history, Surgical history, Family history not pertinant except as noted below, Social history, Allergies, and medications have been entered into the medical record, reviewed, and corrections made.   Review of Systems: No fevers, chills, night sweats, weight loss, chest pain, or shortness of breath.   Objective:    General: Well Developed, well nourished, and in no acute distress.  Neuro: Alert and oriented x3, extra-ocular muscles intact, sensation grossly intact.  HEENT: Normocephalic, atraumatic  Skin: Warm and dry, no rashes. Cardiac: Regular rate and rhythm, no murmurs rubs or gallops, no lower extremity edema.  Respiratory: Clear to auscultation bilaterally. Not using accessory muscles, speaking in full sentences. Ext: There is trace pitting edema over the left lower leg and 1+ around the ankle.  The skin feels much cooler to touch on the left compared to his right lower leg.   No significant discomfort or pain with pressure or squeezing.   Impression and Recommendations:    DM - Well controlled. Continue current regimen. Follow up in  66month.  We will continue current regimen.  His A1c did jump up significantly in a short period time but he also has not been nearly as active since his stroke and that is probably the biggest difference but right now his A1c is still under 7 so I did not make any medication changes today.  Just continue to monitor diet. 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  He is going to start more formal physical therapy.  He can walk with assistance and with wearing a brace.  Is not been walking independently.  HTN - Well controlled. Continue current regimen. Follow up in  4 months.    Left ankle edema-I am concerned that the skin and foot is very cold to touch.  I would like to get a Doppler just to rule out DVT since he has not been able to move that left lower leg since his stroke and he is not on any anticoagulation currently.

## 2017-12-12 ENCOUNTER — Ambulatory Visit: Payer: Medicare Other | Admitting: Occupational Therapy

## 2017-12-12 ENCOUNTER — Ambulatory Visit: Payer: Medicare Other | Attending: Physical Medicine & Rehabilitation

## 2017-12-12 DIAGNOSIS — M25532 Pain in left wrist: Secondary | ICD-10-CM | POA: Insufficient documentation

## 2017-12-12 DIAGNOSIS — M25512 Pain in left shoulder: Secondary | ICD-10-CM | POA: Insufficient documentation

## 2017-12-12 DIAGNOSIS — G8929 Other chronic pain: Secondary | ICD-10-CM | POA: Insufficient documentation

## 2017-12-12 DIAGNOSIS — M6281 Muscle weakness (generalized): Secondary | ICD-10-CM | POA: Insufficient documentation

## 2017-12-12 DIAGNOSIS — R2681 Unsteadiness on feet: Secondary | ICD-10-CM | POA: Insufficient documentation

## 2017-12-12 DIAGNOSIS — R293 Abnormal posture: Secondary | ICD-10-CM | POA: Insufficient documentation

## 2017-12-12 DIAGNOSIS — M25632 Stiffness of left wrist, not elsewhere classified: Secondary | ICD-10-CM | POA: Insufficient documentation

## 2017-12-12 DIAGNOSIS — R2689 Other abnormalities of gait and mobility: Secondary | ICD-10-CM | POA: Insufficient documentation

## 2017-12-12 DIAGNOSIS — I69354 Hemiplegia and hemiparesis following cerebral infarction affecting left non-dominant side: Secondary | ICD-10-CM | POA: Insufficient documentation

## 2017-12-12 DIAGNOSIS — M25612 Stiffness of left shoulder, not elsewhere classified: Secondary | ICD-10-CM | POA: Insufficient documentation

## 2017-12-13 ENCOUNTER — Ambulatory Visit (INDEPENDENT_AMBULATORY_CARE_PROVIDER_SITE_OTHER): Payer: Medicare Other | Admitting: Neurology

## 2017-12-13 ENCOUNTER — Encounter: Payer: Self-pay | Admitting: Neurology

## 2017-12-13 ENCOUNTER — Other Ambulatory Visit: Payer: Self-pay

## 2017-12-13 VITALS — BP 110/62 | HR 73 | Ht 63.0 in | Wt 172.0 lb

## 2017-12-13 DIAGNOSIS — I639 Cerebral infarction, unspecified: Secondary | ICD-10-CM

## 2017-12-13 DIAGNOSIS — G40009 Localization-related (focal) (partial) idiopathic epilepsy and epileptic syndromes with seizures of localized onset, not intractable, without status epilepticus: Secondary | ICD-10-CM | POA: Diagnosis not present

## 2017-12-13 MED ORDER — DIVALPROEX SODIUM ER 500 MG PO TB24: 500.0000 mg | ORAL_TABLET | Freq: Every day | ORAL | 3 refills | Status: DC

## 2017-12-13 MED ORDER — CLOPIDOGREL BISULFATE 75 MG PO TABS: 75.0000 mg | ORAL_TABLET | Freq: Every day | ORAL | 3 refills | Status: DC

## 2017-12-13 NOTE — Progress Notes (Signed)
NEUROLOGY FOLLOW UP OFFICE NOTE  Fred [name] [id number] [birth year]  HISTORY OF PRESENT ILLNESS: I had the pleasure of seeing Fred [name] in follow-up in the neurology clinic on 12/13/2017.  The patient was last seen 6 months ago for recurrent episodes of aphasia and confusion. He is again accompanied by his daughter who helps supplement the history today. With his most recent episode in January 2019, he was aphasic with right-sided neglect, he was given IV-tPA with resolution of symptoms within 15-20 minutes. MRI brain no acute changes, EEG showed mild diffuse slowing. He was discharged home on Depakote ER 500mg  qhs. Since his last visit, his daughter denies any further confusion episodes or gibberish speech, however he had another stroke last 08/09/17. Records were reviewed. He started having left-sided weakness and was brought to the ER, initially with 4+/5 strength. NIH of 1. Over the course of his stay, he had progressive worsening left hemiparesis and slurred speech. MRI brain showed right posterior limb of internal capsule stroke. 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 CTA head and neck showed <50 right ICA stenosis, 70% left ICA stenosis, LDL 130, HbA1c 6.0, normal echocardiogram. He had been taking aspirin 325mg  daily prior to this stroke, he was discharged home on Plavix and aspirin to take for 3 weeks, then has been back to aspirin alone. There has been some improvement in left sided weakness, he can now push with his left leg and slightly lift up his left arm. No numbness/tingling. He manages his own medications. He will be starting outpatient PT/OT tomorrow and looks forward to it. He denies any headaches, dizziness, vision changes. No falls. Family reported increased alcohol intake prior to the stroke due to his wife's recent diagnosis of dementia.   History on Initial Assessment 06/07/2017: This is an 82 year  old right-handed man with a history of hypertension, diabetes, CAD s/p CABG, post-operative atrial fibrillation, PVD, prior stroke with subsequent seizures, presenting to establish care. He had previously been seeing neurologist Dr. Erlinda Hong, records reviewed. He had a stroke in June 2016 with left-sided weakness and confusion. He did not receive IV-TPA due to rapidly resolving deficits. His MRI brain showed an acute small infarct in the right posterior frontal lobe and remote small thalamic infarcts. Stroke workup was unremarkable except for elevated HbA1c of 7.3 and EF 45-40%. Due to questionable seizure, he was discharged home on Depakote ER 500mg  daily. There is note of a confusional episode when he had a CABG done in 2015 with post-procedure atrial fibrillation, he had an EEG, results unavailable. On his follow-up with Dr. Erlinda Hong in August 2016, they asked about continuing Depakote for seizure and it was discontinued. He was off Depakote on follow-up in November 2017. On his last visit with Dr. Erlinda Hong in July 2018, it was noted that he was put back on Depakote by his PCP for seizure prevention in May 2018 (refills were sent and a referral to Neurology was done at that time). On 03/21/2017 he had an episode of garbled speech and confusion. They could not make out what he was saying, it was all gibberish. He was getting frustrated because they could not understand him. He was holding a can of soda in his hand and clenched on it so hard the soda spilled. He started making words in 3-4 minutes. When EMS arrived, speech was still slurred, he was still confused and agreed to go to the hospital. He was  brought to [facility] where he was noted to be aphasic with probable right-sided neglect and homonymous hemianopia. Family reports he said he was 82 years old and did not know the president. Head CT and CTA were negative, due to concern for stroke he was given IV-TPA within the 4-1/2 hour window. His daughter reports that within 15-20  minutes after TPA, they saw a dramatic change. His MRI brain did not show any infarct. It was noted he had a stroke-like episode treated with IV-TPA with full recovery, possible complex partial seizure. He had an EEG which was mildly abnormal due to mild background slowing. It is unclear when Depakote was stopped, but family reports it was restarted at that time (Depakote level on admission was <10). He was discharged home on Depakote ER 500mg  qhs and has overall been doing better.  He does not recall the hospitalization in January 2019 and denies having any difficulties. His daughter also reports an episode in 2017 where he came out of the car looking straight ahead, stammering, mumbling but not saying real words. Then he just came out of it and was fine. They brought him to the hospital (no records on Epic) and was told it was probably a TIA and seizure. His daughter denies any other staring/unresponsive episodes. He denies any olfactory/gustatory hallucinations, deja vu, rising epigastric sensation, focal numbness/tingling/weakness, myoclonic jerks. He denies any headaches, dizziness, diplopia, dysarthria/dysphagia, neck/back pain, bowel/bladder dysfunction. Sleep is good. He has some knee pain and leg cramps.   Epilepsy Risk Factors:  His father had seizures. Prior stroke. Otherwise he had a normal birth and early development.  There is no history of febrile convulsions, CNS infections such as meningitis/encephalitis, significant traumatic brain injury, neurosurgical procedures.  PAST MEDICAL HISTORY: Past Medical History:  Diagnosis Date  . Atrial fibrillation ([place])    Post operative  . CAD (coronary artery disease)    s/p cabg  . CHF (congestive heart failure) ([place])   . Diverticulosis   . DJD (degenerative joint disease)   . DM (diabetes mellitus) ([place])   . History of anemia   . History of stroke   . Hyperlipidemia   . Hypertension   . 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 Peripheral vascular disease ([place])   . Polyneuropathy, diabetic ([place])   . Seizure disorder, complex partial ([place])   . Syncope and collapse     MEDICATIONS: Current Outpatient Medications on File Prior to Visit  Medication Sig Dispense Refill  . acetaminophen (TYLENOL) 325 MG tablet Take 2 tablets (650 mg total) by mouth every 4 (four) hours as needed for mild pain (or temp > 37.5 C (99.5 F)).    Marland Kitchen allopurinol (ZYLOPRIM) 300 MG tablet Take 1 tablet (300 mg total) by mouth daily. 30 tablet 1  . AMBULATORY NON FORMULARY MEDICATION Medication Name: left lateral arm support/stabilizer (L) 1 each 0  . AMBULATORY NON FORMULARY MEDICATION Motorized wheelchair/ Fax to  [facility] [phone] 1 each 0  . aspirin 325 MG tablet Take 1 tablet (325 mg total) by mouth daily.    Marland Kitchen atorvastatin (LIPITOR) 80 MG tablet Take 1 tablet (80 mg total) by mouth daily. 30 tablet 1  . Coenzyme Q10 (COQ10) 30 MG CAPS Take 1 tablet by mouth daily.      . diclofenac sodium (VOLTAREN) 1 % GEL Apply 2 g topically 4 (four) times daily. 1 Tube 1  . divalproex (DEPAKOTE ER) 500 MG 24 hr tablet Take 1 tablet (  500 mg total) by mouth daily. 30 tablet 11  . ezetimibe (ZETIA) 10 MG tablet Take 1 tablet (10 mg total) by mouth daily. 90 tablet 3  . furosemide (LASIX) 20 MG tablet Take 1 tablet (20 mg total) by mouth daily. 30 tablet 1  . Insulin Glargine (LANTUS SOLOSTAR) 100 UNIT/ML Solostar Pen Inject 10-20 Units into the skin daily at 10 pm. (Patient taking differently: Inject 10 Units into the skin daily at 10 pm. ) 15 mL 11  . ipratropium (ATROVENT) 0.03 % nasal spray USE 2 SPRAYS IN EACH NOSTRIL EVERY 12 HOURS 30 mL 3  . linagliptin (TRADJENTA) 5 MG TABS tablet Take 1 tablet (5 mg total) by mouth daily. insuranc would not cover januvia 30 tablet 5  . losartan (COZAAR) 25 MG tablet Take 1 tablet (25 mg total) by mouth daily. 90 tablet 1  . metFORMIN (GLUCOPHAGE) 500 MG tablet Take 1 tablet (500 mg total) by mouth 2  (two) times daily with a meal. (Patient taking differently: Take 1,000 mg by mouth 2 (two) times daily with a meal. ) 60 tablet 1  . metoprolol succinate (TOPROL-XL) 50 MG 24 hr tablet Take 1 tablet (50 mg total) by mouth daily. Take with or immediately following a meal. 90 tablet 1  . Multiple Vitamin (MULTIVITAMIN WITH MINERALS) TABS tablet Take 1 tablet by mouth daily.    . niacin (NIASPAN) 500 MG CR tablet Take 1 tablet (500 mg total) by mouth at bedtime. 90 tablet 3  . senna-docusate (SENOKOT-S) 8.6-50 MG tablet Take 1 tablet by mouth 2 (two) times daily.     No current facility-administered medications on file prior to visit.     ALLERGIES: Allergies  Allergen Reactions  . Lisinopril Cough    FAMILY HISTORY: Family History  Problem Relation Age of Onset  . Epilepsy Father   . Stroke Mother   . Lung cancer Unknown     SOCIAL HISTORY: Social History   Socioeconomic History  . Marital status: Married    Spouse name: Thurmon Mizell  . Number of children: 3  . Years of education: Not on file  . Highest education level: Not on file  Occupational History  . 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 Financial resource strain: Not on file  . Food insecurity:    Worry: Not on file    Inability: Not on file  . Transportation needs:    Medical: Not on file    Non-medical: Not on file  Tobacco Use  . Smoking status: Former Smoker    Packs/day: 1.00    Years: 22.00    Pack years: 22.00    Types: Cigarettes    Last attempt to quit: 03/14/1977    Years since quitting: 40.7  . Smokeless tobacco: Never Used  Substance and Sexual Activity  . Alcohol use: Yes  . Drug use: No  . Sexual activity: Never  Lifestyle  . Physical activity:    Days per week: Not on file    Minutes per session: Not on file  . Stress: Not on file  Relationships  . Social connections:    Talks on phone: Not on file    Gets together: Not  on file    Attends religious service: Not on file    Active member of club or organization: Not on file    Attends meetings of clubs or organizations: Not on file  Relationship status: Not on file  . Intimate partner violence:    Fear of current or ex partner: Not on file    Emotionally abused: Not on file    Physically abused: Not on file    Forced sexual activity: Not on file  Other Topics Concern  . Not on file  Social History Narrative   3 caffeinated drinks per day. Mostly coffee. He walks 15 minutes 3 days per week. Quit smoking in 1978.      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     REVIEW OF SYSTEMS: Constitutional: No fevers, chills, or sweats, no generalized fatigue, change in appetite Eyes: No visual changes, double vision, eye pain Ear, nose and throat: No hearing loss, ear pain, nasal congestion, sore throat Cardiovascular: No chest pain, palpitations Respiratory:  No shortness of breath at rest or with exertion, wheezes GastrointestinaI: No nausea, vomiting, diarrhea, abdominal pain, fecal incontinence Genitourinary:  No dysuria, urinary retention or frequency Musculoskeletal:  No neck pain, back pain Integumentary: No rash, pruritus, skin lesions Neurological: as above Psychiatric: No depression, insomnia, anxiety Endocrine: No palpitations, fatigue, diaphoresis, mood swings, change in appetite, change in weight, increased thirst Hematologic/Lymphatic:  No anemia, purpura, petechiae. Allergic/Immunologic: no itchy/runny eyes, nasal congestion, recent allergic reactions, rashes  PHYSICAL EXAM: Vitals:   12/13/17 1120  BP: 110/62  Pulse: 73  SpO2: 96%   General: No acute distress, sitting on wheelchair Head:  Normocephalic/atraumatic Neck: supple, no paraspinal tenderness, full range of motion Heart:  Regular rate and rhythm Lungs:  Clear to auscultation  bilaterally Back: No paraspinal tenderness Skin/Extremities: No rash, no edema Neurological Exam: alert and oriented to person, place, and time. No aphasia or dysarthria. Fund of knowledge is appropriate.  Recent and remote memory are intact.  Attention and concentration are normal.    Able to name objects and repeat phrases. Cranial nerves: Pupils equal, round, reactive to light.  Extraocular movements intact with no nystagmus. Visual fields full. Facial sensation intact. No facial asymmetry. Tongue, uvula, palate midline.  Motor: Increased tone on left UE. 5/5 on right UE and LE, 3/5 left UE, 2/5 distal left UE, 2/5 left LE. Sensation to light touch, temperature and vibration intact.  No extinction to double simultaneous stimulation.  Deep tendon reflexes brisk +3 on left UE, +2 on left LE and right side. Finger to nose testing intact on right. Gait not tested, sitting on wheelchair, needs 2-person assist.  IMPRESSION: This is a 82 yo RH man with a history of hypertension, diabetes, CAD s/p CABG, post-operative atrial fibrillation, PVD, prior stroke, and seizures. He has had recurrent episodes of confusion with aphasia and possible right-sided weakness. He is on Depakote ER 500mg  qhs with no further similar episodes since January 2019. At that time he had received TPA with complete resolution of symptoms and negative MRI, ?seizure. Since then, he has had a right internal capsule stroke secondary to severe small vessel disease. He was taking aspirin prior to the stroke, we discussed switching to Plavix 75mg  daily. Continue control of vascular risk factors. We discussed continuation of Depakote for now. Continue with PT/OT as planned. They know to go to the ER immediately for any sudden change in symptoms. Follow-up in 6 months, he knows to call for any changes.   Thank you for allowing me to participate in his care.  Please do not hesitate to  call for any questions or concerns.  The duration of this  appointment visit was 30 minutes of face-to-face time with the patient.  Greater than 50% of this time was spent in counseling, explanation of diagnosis, planning of further management, and coordination of care.   Ellouise Newer, M.D.   CC: Dr. Madilyn Fireman

## 2017-12-13 NOTE — Patient Instructions (Signed)
1. Switch from aspirin to Plavix 75mg  daily 2. Continue with PT/OT as planned 3. Continue Depakote ER 500mg  daily 4. For any sudden change in symptoms, go to ER immediately 5. Follow-up in 6 months or so, call for any changes  Seizure Precautions: 1. If medication has been prescribed for you to prevent seizures, take it exactly as directed.  Do not stop taking the medicine without talking to your doctor first, even if you have not had a seizure in a long time.   2. Avoid activities in which a seizure would cause danger to yourself or to others.  Don't operate dangerous machinery, swim alone, or climb in high or dangerous places, such as on ladders, roofs, or girders.  Do not drive unless your doctor says you may.  3. If you have any warning that you may have a seizure, lay down in a safe place where you can't hurt yourself.    4.  No driving for 6 months from last seizure, as per [facility].   Please refer to the following link on the [facility] website for more information: [email or web address]   5.  Maintain good sleep hygiene. Avoid alcohol.  6.  Contact your doctor if you have any problems that may be related to the medicine you are taking.  7.  Call 911 and bring the patient back to the ED if:        A.  The seizure lasts longer than 5 minutes.       B.  The patient doesn't awaken shortly after the seizure  C.  The patient has new problems such as difficulty seeing, speaking or moving  D.  The patient was injured during the seizure  E.  The patient has a temperature over 102 F (39C)  F.  The patient vomited and now is having trouble breathing

## 2017-12-14 ENCOUNTER — Encounter: Payer: Self-pay | Admitting: Occupational Therapy

## 2017-12-14 ENCOUNTER — Ambulatory Visit: Payer: Medicare Other

## 2017-12-14 ENCOUNTER — Ambulatory Visit: Payer: Medicare Other | Admitting: Occupational Therapy

## 2017-12-14 ENCOUNTER — Other Ambulatory Visit: Payer: Self-pay

## 2017-12-14 VITALS — BP 130/61 | HR 73

## 2017-12-14 DIAGNOSIS — M25512 Pain in left shoulder: Secondary | ICD-10-CM | POA: Diagnosis not present

## 2017-12-14 DIAGNOSIS — M25532 Pain in left wrist: Secondary | ICD-10-CM

## 2017-12-14 DIAGNOSIS — M25632 Stiffness of left wrist, not elsewhere classified: Secondary | ICD-10-CM | POA: Diagnosis not present

## 2017-12-14 DIAGNOSIS — M6281 Muscle weakness (generalized): Secondary | ICD-10-CM

## 2017-12-14 DIAGNOSIS — I69354 Hemiplegia and hemiparesis following cerebral infarction affecting left non-dominant side: Secondary | ICD-10-CM

## 2017-12-14 DIAGNOSIS — R2681 Unsteadiness on feet: Secondary | ICD-10-CM

## 2017-12-14 DIAGNOSIS — R293 Abnormal posture: Secondary | ICD-10-CM

## 2017-12-14 DIAGNOSIS — G8929 Other chronic pain: Secondary | ICD-10-CM

## 2017-12-14 DIAGNOSIS — R2689 Other abnormalities of gait and mobility: Secondary | ICD-10-CM | POA: Diagnosis not present

## 2017-12-14 DIAGNOSIS — M25612 Stiffness of left shoulder, not elsewhere classified: Secondary | ICD-10-CM

## 2017-12-14 NOTE — Therapy (Signed)
[facility] [address] [place], [place], [address] Phone: [phone]   Fax:  [phone]  Physical Therapy Evaluation  Patient Details  Name: Fred [name] MRN: [id number] Date of Birth: 1935/05/06 Referring Provider (PT): Dr. Letta Pate   Encounter Date: 12/14/2017  PT End of Session - 12/14/17 1059    Visit Number  1    Number of Visits  17    Date for PT Re-Evaluation  02/12/18    Authorization Type  Medicare, UHC, Tricare (no PTAs). Progress note every 10th visit.     PT Start Time  0932    PT Stop Time  1015    PT Time Calculation (min)  43 min    Equipment Utilized During Treatment  Gait belt    Activity Tolerance  Patient tolerated treatment well    Behavior During Therapy  WFL for tasks assessed/performed       Past Medical History:  Diagnosis Date  . Atrial fibrillation ([place])    Post operative  . CAD (coronary artery disease)    s/p cabg  . CHF (congestive heart failure) ([place])   . Diverticulosis   . DJD (degenerative joint disease)   . DM (diabetes mellitus) ([place])   . History of anemia   . History of stroke   . Hyperlipidemia   . Hypertension   . 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 Peripheral vascular disease ([place])   . Polyneuropathy, diabetic ([place])   . Seizure disorder, complex partial ([place])   . Syncope and collapse     Past Surgical History:  Procedure Laterality Date  . CATARACT EXTRACTION W/ INTRAOCULAR LENS  IMPLANT, BILATERAL  06/2013   Dr. [name]  . CORONARY ARTERY BYPASS GRAFT  02/13/2004   4 vesel  . removal of sebaceous cyst from neck  08/2007   Dr. [name]  . TONSILLECTOMY      Vitals:   12/14/17 0938  BP: 130/61  Pulse: 73     Subjective Assessment - 12/14/17 0944    Subjective  Pt reported he had HHPT after 09/12/17 d/c from CIR s/p second CVA on 08/09/17. He had HHPT/OT upon d/c from CIR but felt like they didn't do much for him. Pt uses  manual w/c and electric scooter for mobility and is unable to amb. long distances. Pt reported he has a difficulty time controlling LLE and that LUE is weak. Pt has a knee brace and L AFO and will bring to next appt. Pt denied falls in the last 6 months but has slid off the sofa when sitting on sofa (prior to hospital admission).     Patient is accompained by:  Family member   SIL: Vernon   Pertinent History  DM with polyneuropathy, hx of cataracts, hx of CVA in 03/2017 and again 07/2017, OA in R knee, DJD, HTN, HLD, coronary atherosclerosis, a-fib, CHF, PVD, parotid lesion, diverticulitis, partial epilepsy, anemia    Patient Stated Goals  "I want to get back to walking so I can get outside."     Currently in Pain?  No/denies         [facility] PT Assessment - 12/14/17 0949      Assessment   Medical Diagnosis  Hemiparesis affecting left side as late effect of cerebrovascular accident  ([name])    Referring Provider (PT)  Dr. Letta Pate    Onset Date/Surgical Date  08/09/17    Hand Dominance  Right  Prior Therapy  CIR and HHPT/OT      Precautions   Precautions  Fall      Restrictions   Weight Bearing Restrictions  No      Balance Screen   Has the patient fallen in the past 6 months  No    Has the patient had a decrease in activity level because of a fear of falling?   Yes    Is the patient reluctant to leave their home because of a fear of falling?   Yes      [place]  Private residence    Living Arrangements  Spouse/significant other    Available Help at Discharge  Family;Available 24 hours/day   family lives next door   Type of [place] entrance    Home Layout  Two level;Able to live on main level with bedroom/bathroom    Alternate Level Stairs-Number of Steps  pt has a chair lift to go upstairs, but doesn't use it    Management consultant - manual;Electric scooter;Tub bench;Bedside commode;Grab bars - toilet;Other (comment)    bed raises and lowers, hemiwalker     Prior Function   Level of Independence  Independent    Vocation  Retired    Leisure  I am outdoor guy- I like to fish on my [place]. I had a deep sea fishing trip scheduled when I got sick      Cognition   Overall Cognitive Status  Within Functional Limits for tasks assessed      Sensation   Light Touch  Appears Intact    Additional Comments  Pt denied N/T, despite neuropathy dx.      Coordination   Gross Motor Movements are Fluid and Coordinated  No    Fine Motor Movements are Fluid and Coordinated  No      Posture/Postural Control   Posture/Postural Control  Postural limitations    Postural Limitations  Rounded Shoulders;Forward head;Weight shift right;Flexed trunk      Tone   Assessment Location  Left Lower Extremity      ROM / Strength   AROM / PROM / Strength  AROM;Strength      AROM   Overall AROM   Deficits    Overall AROM Comments  LLE: lacking approx. 5 degrees (active but can obtain neutral with PROM) of L knee ext 2/2 hamstring tightness. Limited knee flex. and ankle DF 2/2 weakness.       Strength   Overall Strength  Deficits    Overall Strength Comments  RLE: WFL (4/5-5/5). LLE: hip flex: 2-/5, knee ext: 3+/5, knee flex: 1/5, ankle DF: 2/5. Seated hip abd/add: 2/5.      Transfers   Transfers  Sit to Stand;Stand to Sit    Sit to Stand  4: Min guard;With upper extremity assist   to/from w/c   Stand to Sit  4: Min guard;With upper extremity assist   to/from w/c     Ambulation/Gait   Ambulation/Gait  Yes    Ambulation/Gait Assistance  4: Min assist;Other (comment)   +2 for safety   Ambulation/Gait Assistance Details  Pt amb. with +2 assist to ensure safety, as pt and pt's family reported L knee buckles and pt "goes down" without warning. Cues to improve posture, sequencing with hemiwalker, improving narrow BOS. Pt donned L AFO during amb. Pt noted to experience decr. L hip flexion with L knee flexed during  stance.  Pt's L shoe has the "grip shaved off" per pt but pt continues to have difficulty advancing LLE 2/2 weakness, even with L AFO donned.     Ambulation Distance (Feet)  15 Feet    Assistive device  Hemi-walker    Gait Pattern  Step-to pattern;Decreased stride length;Decreased stance time - left;Decreased step length - right;Decreased dorsiflexion - left;Decreased hip/knee flexion - left;Decreased weight shift to left;Left flexed knee in stance;Decreased trunk rotation;Narrow base of support    Ambulation Surface  Level;Indoor    Gait velocity  Unable to obtain as pt required seated rest break after 15', but speed was slow.       Balance   Balance Assessed  Yes      Static Standing Balance   Static Standing - Balance Support  Right upper extremity supported    Static Standing - Level of Assistance  Other (comment)   min guard   Static Standing - Comment/# of Minutes  Pt able to stand with feet apart and RUE supported for approx. 10-15 sec. prior to requiring seated rest break 2/2 LLE fatigue.      LLE Tone   LLE Tone  Modified Ashworth      LLE Tone   Modified Ashworth Scale for Grading Hypertonia LLE  No increase in muscle tone                Objective measurements completed on examination: See above findings.              PT Education - 12/14/17 1058    Education Details  PT discussed exam results, POC, frequency, and duration.     Person(s) Educated  Patient;Child(ren)   Son-in-law: Lynnae Sandhoff   Methods  Explanation    Comprehension  Verbalized understanding       PT Short Term Goals - 12/14/17 1228      PT SHORT TERM GOAL #1   Title  Pt will be IND in HEP to improve the deficits listed above. TARGET DATE FOR ALL STGS: 01/11/18    Status  New      PT SHORT TERM GOAL #2   Title  Pt will be able to standing without UE support and feet apart for 2 minutes, with S, to perform ADLs safely.     Status  New      PT SHORT TERM GOAL #3   Title  Perform TUG, gait  speed, and BERG-write STGs/LTGs, once tolerated.     Status  New      PT SHORT TERM GOAL #4   Title  Pt will amb. 100' with LRAD, over even terrain, at MOD I level to improve functional mobility.     Status  New        PT Long Term Goals - 12/14/17 1229      PT LONG TERM GOAL #1   Title  Pt will verbalize understanding of CVA risk factors and s/s of CVA to reduce risk of additional CVA. TARGET DATE FOR ALL LTGS: 02/08/18    Status  New      PT LONG TERM GOAL #2   Title  Pt will amb. 200' outdoors, with LRAD, with S, in order to walk in his yard safely.     Status  New      PT LONG TERM GOAL #3   Title  Trial manual w/c outdoors and write goal as indicated.     Status  New  Plan - 12/14/17 1221    Clinical Impression Statement  Pt is a pleasant 82y/o male presenting to OPPT neuro s/p 07/2017 CVA resulting in L hemiparesis. Pt's PMH is significant for the following: DM with polyneuropathy, hx of cataracts, hx of CVA in 03/2017 and again 07/2017, OA in R knee, DJD, HTN, HLD, coronary atherosclerosis, a-fib, CHF, PVD, parotid lesion, diverticulitis, partial epilepsy, anemia. The following impairments were noted upon exam: gait deviations, L sided weakness, impaired coordination, impaired balance, decr. AROM, impaired flexibility, decr. endurance, postural dysfunction. PT unable to perform formal balance testing or gait speed 2/2 pt unable to remain standing (even with RUE support) for prolonged periods of time 2/2 fatigue but will test once indicated. Pt would benefit from skilled PT to improve safety during functional mobility.     History and Personal Factors relevant to plan of care:  Pt lives alone with wife, who was recenty diagnosed with dementia. However, family does live next door. Pt very active prior to first CVA in 03/2017. Retired.    Clinical Presentation  Evolving    Clinical Presentation due to:  DM with polyneuropathy, hx of cataracts, hx of CVA in 03/2017 and  again 07/2017, OA in R knee, DJD, HTN, HLD, coronary atherosclerosis, a-fib, CHF, PVD, parotid lesion, diverticulitis, partial epilepsy, anemia    Clinical Decision Making  Moderate    Rehab Potential  Good    Clinical Impairments Affecting Rehab Potential  see above.     PT Frequency  2x / week    PT Duration  8 weeks    PT Treatment/Interventions  ADLs/Self Care Home Management;Biofeedback;Canalith Repostioning;Electrical Stimulation;Therapeutic activities;Therapeutic exercise;Manual techniques;Vestibular;Wheelchair mobility training;Functional mobility training;Orthotic Fit/Training;Stair training;Gait training;Patient/family education;DME Instruction;Neuromuscular re-education;Balance training   caution with e-stim 2/2 hx of epilepsy. Neuropathy also noted in chart but pt's sensation intact during eval.   PT Next Visit Plan  Establish HEP (strengthening, flexibility, balance). Complete TUG and BERG and gait speed once appropriate    Consulted and Agree with Plan of Care  Patient;Family member/caregiver    Family Member Consulted  son-in-law (SIL): Vernon       Patient will benefit from skilled therapeutic intervention in order to improve the following deficits and impairments:  Abnormal gait, Decreased endurance, Decreased knowledge of use of DME, Decreased strength, Impaired UE functional use, Decreased balance, Decreased mobility, Decreased range of motion, Decreased coordination, Impaired flexibility, Postural dysfunction(LUE spasticty but no LLE tone noted.)  Visit Diagnosis: Hemiplegia and hemiparesis following cerebral infarction affecting left non-dominant side (HCC) - Plan: PT plan of care cert/re-cert  Other abnormalities of gait and mobility - Plan: PT plan of care cert/re-cert  Unsteadiness on feet - Plan: PT plan of care cert/re-cert     Problem List Patient Active Problem List   Diagnosis Date Noted  . 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 Acute blood loss anemia   . Chronic diastolic (congestive) heart failure ([place])   . Acute idiopathic gout of right knee   . Primary osteoarthritis of right knee   . Hemiparesis affecting left side as late effect of cerebrovascular accident (CVA) ([place])   . Diabetes mellitus type 2 in obese ([place])   . Small vessel disease ([place]) 08/11/2017  . Left hemiparesis ([place])   . Benign essential hypertension with delivery   . CVA (cerebral vascular accident) ([place]) 08/10/2017  . 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 of localized onset, not intractable, without status epilepticus ([place]) 06/20/2017  . Primary  osteoarthritis of both knees 05/11/2017  . Stroke (cerebrum) ([place]) 03/21/2017  . Type 2 diabetes mellitus with complication, with long-term current use of insulin ([place]) 03/27/2015  . Atherosclerosis of native coronary artery of native heart without angina pectoris 03/27/2015  . Palpitations 11/04/2014  . S/P CABG (coronary artery bypass graft) 11/04/2014  . Atrial fibrillation, unspecified   . History of stroke 09/04/2014  . Obesity (BMI 30-39.9) 11/29/2013  . History of gout 05/16/2013  . Congestive heart failure ([place]) 04/20/2009  . DYSPNEA 02/11/2009  . CARDIOVASCULAR FUNCTION STUDY, ABNORMAL 01/29/2009  . NECK PAIN 08/03/2007  . ANEMIA / OTHER 05/16/2007  . Coronary atherosclerosis 05/16/2007  . PAROXYSMAL ATRIAL FIBRILLATION 05/16/2007  . Subcortical infarction ([place]) 05/16/2007  . Hyperlipidemia 03/30/2007  . DEGENERATIVE JOINT DISEASE 03/30/2007  . PAROTID LESION, UNSPECIFIED 09/29/2006  . Partial epilepsy with impairment of consciousness ([place]) 09/29/2006  . Diabetic polyneuropathy ([place]) 09/29/2006  . Essential hypertension 09/29/2006  . PERIPHERAL VASCULAR DISEASE 09/29/2006  . DIVERTICULOSIS 09/29/2006    Eleina Jergens L 12/14/2017, 12:32 PM  [facility] [address]  [place], [place], [address] Phone: [phone]   Fax:  [phone]  Name: Fred [name] MRN: [id number] Date of Birth: 21-Nov-1935  Geoffry Paradise, PT,DPT 12/14/17 12:32 PM Phone: [phone] Fax: [phone]

## 2017-12-14 NOTE — Therapy (Signed)
[facility] [address] [place], [place], [address] Phone: [phone]   Fax:  [phone]  Occupational Therapy Evaluation  Patient Details  Name: Fred [name] MRN: [id number] Date of Birth: 1935/07/26 Referring Provider (OT): Dr. Alysia Penna   Encounter Date: 12/14/2017  OT End of Session - 12/14/17 1003    Visit Number  1    Number of Visits  24    Date for OT Re-Evaluation  03/08/18    Authorization Type  Medicare, T[employer], U[employer] - pt will need PN every 10th visit    Authorization Time Period  90 days    Authorization - Visit Number  1    Authorization - Number of Visits  10    OT Start Time  0844    OT Stop Time  0930    OT Time Calculation (min)  46 min    Activity Tolerance  Patient tolerated treatment well       Past Medical History:  Diagnosis Date  . Atrial fibrillation ([place])    Post operative  . CAD (coronary artery disease)    s/p cabg  . CHF (congestive heart failure) ([place])   . Diverticulosis   . DJD (degenerative joint disease)   . DM (diabetes mellitus) ([place])   . History of anemia   . History of stroke   . Hyperlipidemia   . Hypertension   . 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 Peripheral vascular disease ([place])   . Polyneuropathy, diabetic ([place])   . Seizure disorder, complex partial ([place])   . Syncope and collapse     Past Surgical History:  Procedure Laterality Date  . CATARACT EXTRACTION W/ INTRAOCULAR LENS  IMPLANT, BILATERAL  06/2013   Dr. [name]  . CORONARY ARTERY BYPASS GRAFT  02/13/2004   4 vesel  . removal of sebaceous cyst from neck  08/2007   Dr. [name]  . TONSILLECTOMY      There were no vitals filed for this visit.  Subjective Assessment - 12/14/17 0847    Subjective   I have arthritis in my  R knee so once in awhile I get a flair up.      Patient is accompained by:  Family member   son in law   Pertinent History  [name] 08/09/2017.  DIscharged home on 09/22/2017 after inpt rehab stay. Pt had HH PT and OT. PMH:      Patient Stated Goals  I want to get walking again.  I am an outdoor guy    Currently in Pain?  No/denies        [facility] OT Assessment - 12/14/17 0001      Assessment   Medical Diagnosis  [name]    Referring Provider (OT)  Dr. Alysia Penna    Onset Date/Surgical Date  08/09/17    Hand Dominance  Right    Prior Therapy  inpt rehab PT, OT and ST. HHPT and OT      Precautions   Precautions  Fall      Restrictions   Weight Bearing Restrictions  No      Balance Screen   Has the patient fallen in the past 6 months  Yes   Slid off the couch a few times-pt has PT eval today.     Home  Environment   Family/patient expects to be discharged to:  Private residence    Living Arrangements  Spouse/significant other    Available  Help at Discharge  Available 24 hours/day   wife, dtr and grand dtr all help   Type of [place]  Two level    Bathroom Shower/Tub  Technical sales engineer    Additional Comments  Pt now lives on first level (bed and bathroom)  Pt has transfer tub bench, pt also 3 in 1 over toilet and has grab bar near toilet.        Prior Function   Level of Independence  Independent    Vocation  Retired    Leisure  I am outdoor guy- I like to fish on my pontoon boat. I had a deep sea fishing trip scheduled when I got sick      ADL   Eating/Feeding  Minimal assistance   to cut food   Grooming  Independent    Upper Body Bathing  Minimal assistance    Lower Body Bathing  Moderate assistance    Upper Body Dressing  --   mod I   Lower Body Dressing  Maximal assistance    Toilet Transfer  Min guard   uses grab bar   Toileting - Clothing Manipulation  Moderate assistance    Toileting -  Hygiene  Moderate assistance    Tub/Shower Transfer  Moderate assistance   steadying assist and helps with LLE     IADL   Shopping  Needs to be accompanied on any  shopping trip    Light Housekeeping  --   pt only did outside work    Meal Prep  Able to complete simple cold meal and snack prep   pt enjoyed cooking prior to [place] on family or friends for transportation    Medication Management  Takes responsibility if medication is prepared in advance in seperate dosage    Financial Management  Requires assistance      Mobility   Mobility Status  Needs assist      Written Expression   Dominant Hand  Right      Vision - History   Baseline Vision  Bifocals    Additional Comments  Pt denies prior visual changes.  Pt states close vision is not as clear (reading)       Vision Assessment   Eye Alignment  Within Functional Limits    Ocular Range of Motion  Within Functional Limits      Activity Tolerance   Activity Tolerance  Tolerate 30+ min activity without fatigue      Cognition   Overall Cognitive Status  Within Functional Limits for tasks assessed    Cognition Comments  Pt does not appear to have any cognitive deficits at this time - family and pt deny any issues - will continue to assess via functional activity      Posture/Postural Control   Posture/Postural Control  Postural limitations    Postural Limitations  Rounded Shoulders;Forward head;Weight shift right;Flexed trunk      Sensation   Light Touch  Appears Intact    Hot/Cold  Appears Intact    Proprioception  Appears Intact      Coordination   Gross Motor Movements are Fluid and Coordinated  No    Fine Motor Movements are Fluid and Coordinated  No    Other  Unable at this time to complete any standardized testing      Edema   Edema  No edema in L hand -pt  repots he has it in his L foot      Tone   Assessment Location  Left Upper Extremity      ROM / Strength   AROM / PROM / Strength  AROM;PROM;Strength   LUE     AROM   Overall AROM   Deficits    Overall AROM Comments  LUE:  Shoulder flexion 15*, abduction to 30*, eblow flexion to 90* , IR  WFL's, beginning supination, partial finger flexion /extension. No active wrist movement.      PROM   Overall PROM   Deficits    Overall PROM Comments  LUE:  shoulder flexion to approximately 105* with pain 4/10, abduction to 75* wtih pain 6/10, ER 10*, Wrist flexion WFL's, extension to 30* (pt has pain 4/10 with any wrist movement), full PROM of fingers for extension and 90* for composite flexion, elbow WFL's for flexion/extension.        Strength   Overall Strength  Deficits;Unable to assess    Overall Strength Comments  Unable to do MMT due to altered tone. Pt has flexion synergy initially however once elbow is fully extended has extensor tone in LUE.        Hand Function   Right Hand Gross Grasp  Functional    Left Hand Gross Grasp  Impaired    Left Hand Grip (lbs)  1 pound      LUE Tone   LUE Tone  Hypertonic;Modified [place]worth      LUE Tone   Modified Ashworth Scale for Grading Hypertonia LUE  Considerable increase in muschle tone, passive movement difficult                        OT Short Term Goals - 12/14/17 0943      OT SHORT TERM GOAL #1   Title  Pt and family will be mod I with basic home exercise/home activities program for LUE, balance and functional mobility - 01/25/2018    Status  New      OT SHORT TERM GOAL #2   Title  Pt will be supervision with UB bathing    Status  New      OT SHORT TERM GOAL #3   Title  Pt will be min a for LB bathing    Status  New      OT SHORT TERM GOAL #4   Title  Pt will be close supervision for toilet transfers    Status  New      OT SHORT TERM GOAL #5   Title  Pt will be min a for tub bench transfers (assist with LLE)    Status  New      Additional Short Term Goals   Additional Short Term Goals  Yes      OT SHORT TERM GOAL #6   Title  Pt will tolerate shoulder flexion to 110* with no pain in supine in prep for HEP and low functional reach    Status  New      OT SHORT TERM GOAL #7   Title  Pt will  demonstrate shoulder flexion to at least 30* in prep for low reach    Status  New      OT SHORT TERM GOAL #8   Title  Pt will demonstrate ability to hold light objects in L hand while manipulating with R hand (i.e opening containers) with min a    Status  New  OT SHORT TERM GOAL  #9   TITLE  Pt will be mod a for LB dressing    Status  New      OT SHORT TERM GOAL  #10   TITLE  Pt/family will be mod I with splint wear and care, prn    Status  New        OT Long Term Goals - 12/14/17 0947      OT LONG TERM GOAL #1   Title  Pt and family will be mod I with upgraded HEP/home activities program - 03/08/2018    Status  New      OT LONG TERM GOAL #2   Title  Pt will be min a for LB dressing    Status  New      OT LONG TERM GOAL #3   Title  Pt will be mod I with toilet transfers    Status  New      OT LONG TERM GOAL #4   Title  Pt will be mod I with clothing manipulation with toileting activities    Status  New      OT LONG TERM GOAL #5   Title  Pt will be min a for simple familiar hot meal prep    Status  New      Long Term Additional Goals   Additional Long Term Goals  Yes      OT LONG TERM GOAL #6   Title  Pt will demonstate ability to use LUE as stabilzer at least 25% of the time during basic self care and fishing activities.    Status  New      OT LONG TERM GOAL #7   Title  Pt will be mod I with casting simple fishing pole    Status  New      OT LONG TERM GOAL #8   Title  Pt will demonstate adequate postural alignment and control to complete simple activities in standing without UE support.     Status  New            Plan - 12/14/17 0955    Clinical Impression Statement  Pt is an 82 year old male s/p [name] on 7/35/3299 with complicated medical course. Pt was discharged home on 09/12/2017 after inpt rehab stay. Pt then had HHPT and OT.  Pt presents today with the following deficits that impact his independence in life roles:  L spastic non dominant  hemiplegia, abnormal posture, pain in LUE (shoulder and wrist), deceased AROM/PROM LUE, decreased balance, decreased pt and family education. Pt is very motivated to become as independent as possible.      Occupational Profile and client history currently impacting functional performance  PMH: husband, father, grand father, retiree.  Pt loves the outdoors and fishing.  PMH: CAD, S/P CABG, DM, HTN, HLD, AFIB, CHF, seizure, PVD, obesity, ETHO abuse, TIA in 03/2017.      Occupational performance deficits (Please refer to evaluation for details):  ADL's;IADL's;Leisure;Social Participation;Rest and Sleep    Rehab Potential  Good   for specific goals   Current Impairments/barriers affecting progress:  medical history, severity of motor deficits    OT Frequency  2x / week    OT Duration  12 weeks    OT Treatment/Interventions  Self-care/ADL training;Aquatic Therapy;Electrical Stimulation;Moist Heat;Fluidtherapy;Ultrasound;Therapeutic exercise;Neuromuscular education;Energy conservation;DME and/or AE instruction;Passive range of motion;Manual Therapy;Functional Mobility Training;Splinting;Therapeutic activities;Patient/family education;Balance training    Plan  assess splint if pt brings it, manual therapy to  address LUE shouler girdle alignment, NMR for LUE, balance, functional mobility, ADL's,     Clinical Decision Making  Multiple treatment options, significant modification of task necessary    Consulted and Agree with Plan of Care  Patient;Family member/caregiver    Family Member Consulted  son in law       Patient will benefit from skilled therapeutic intervention in order to improve the following deficits and impairments:  Abnormal gait, Decreased balance, Decreased range of motion, Decreased mobility, Decreased knowledge of use of DME, Decreased strength, Difficulty walking, Impaired UE functional use, Impaired tone, Impaired flexibility, Pain  Visit Diagnosis: Spastic hemiplegia of left nondominant  side as late effect of cerebral infarction [facility]) - Plan: Ot plan of care cert/re-cert  Muscle weakness (generalized) - Plan: Ot plan of care cert/re-cert  Abnormal posture - Plan: Ot plan of care cert/re-cert  Unsteadiness on feet - Plan: Ot plan of care cert/re-cert  Chronic left shoulder pain - Plan: Ot plan of care cert/re-cert  Pain in left wrist - Plan: Ot plan of care cert/re-cert  Stiffness of left shoulder, not elsewhere classified - Plan: Ot plan of care cert/re-cert  Stiffness of left wrist, not elsewhere classified - Plan: Ot plan of care cert/re-cert    Problem List Patient Active Problem List   Diagnosis Date Noted  . 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 Acute blood loss anemia   . Chronic diastolic (congestive) heart failure ([place])   . Acute idiopathic gout of right knee   . Primary osteoarthritis of right knee   . Hemiparesis affecting left side as late effect of cerebrovascular accident (CVA) ([place])   . Diabetes mellitus type 2 in obese ([place])   . Small vessel disease ([place]) 08/11/2017  . Left hemiparesis ([place])   . Benign essential hypertension with delivery   . CVA (cerebral vascular accident) ([place]) 08/10/2017  . 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 of localized onset, not intractable, without status epilepticus ([place]) 06/20/2017  . Primary osteoarthritis of both knees 05/11/2017  . Stroke (cerebrum) ([place]) 03/21/2017  . Type 2 diabetes mellitus with complication, with long-term current use of insulin ([place]) 03/27/2015  . Atherosclerosis of native coronary artery of native heart without angina pectoris 03/27/2015  . Palpitations 11/04/2014  . S/P CABG (coronary artery bypass graft) 11/04/2014  . Atrial fibrillation, unspecified   . History of stroke 09/04/2014  . Obesity (BMI 30-39.9) 11/29/2013  . History of gout 05/16/2013  . Congestive heart failure ([place]) 04/20/2009  . DYSPNEA  02/11/2009  . CARDIOVASCULAR FUNCTION STUDY, ABNORMAL 01/29/2009  . NECK PAIN 08/03/2007  . ANEMIA / OTHER 05/16/2007  . Coronary atherosclerosis 05/16/2007  . PAROXYSMAL ATRIAL FIBRILLATION 05/16/2007  . Subcortical infarction ([place]) 05/16/2007  . Hyperlipidemia 03/30/2007  . DEGENERATIVE JOINT DISEASE 03/30/2007  . PAROTID LESION, UNSPECIFIED 09/29/2006  . Partial epilepsy with impairment of consciousness ([place]) 09/29/2006  . Diabetic polyneuropathy ([place]) 09/29/2006  . Essential hypertension 09/29/2006  . PERIPHERAL VASCULAR DISEASE 09/29/2006  . DIVERTICULOSIS 09/29/2006    Quay Burow, OTR/L 12/14/2017, 10:08 AM  [facility] [address] [place], [place], [address] Phone: [phone]   Fax:  [phone]  Name: Fred [name] MRN: [id number] Date of Birth: [birth year]

## 2017-12-18 ENCOUNTER — Ambulatory Visit: Payer: Medicare Other | Admitting: Occupational Therapy

## 2017-12-18 ENCOUNTER — Encounter: Payer: Self-pay | Admitting: Occupational Therapy

## 2017-12-18 ENCOUNTER — Other Ambulatory Visit: Payer: Self-pay | Admitting: Family Medicine

## 2017-12-18 DIAGNOSIS — R293 Abnormal posture: Secondary | ICD-10-CM

## 2017-12-18 DIAGNOSIS — I69354 Hemiplegia and hemiparesis following cerebral infarction affecting left non-dominant side: Secondary | ICD-10-CM | POA: Diagnosis not present

## 2017-12-18 DIAGNOSIS — M25532 Pain in left wrist: Secondary | ICD-10-CM

## 2017-12-18 DIAGNOSIS — M6281 Muscle weakness (generalized): Secondary | ICD-10-CM

## 2017-12-18 DIAGNOSIS — M25512 Pain in left shoulder: Secondary | ICD-10-CM

## 2017-12-18 DIAGNOSIS — M25612 Stiffness of left shoulder, not elsewhere classified: Secondary | ICD-10-CM

## 2017-12-18 DIAGNOSIS — R2681 Unsteadiness on feet: Secondary | ICD-10-CM

## 2017-12-18 DIAGNOSIS — G8929 Other chronic pain: Secondary | ICD-10-CM | POA: Diagnosis not present

## 2017-12-18 DIAGNOSIS — M25632 Stiffness of left wrist, not elsewhere classified: Secondary | ICD-10-CM

## 2017-12-18 NOTE — Therapy (Signed)
[facility] [address] [place], [place], [address] Phone: [phone]   Fax:  [phone]  Occupational Therapy Treatment  Patient Details  Name: Fred [name] MRN: [id number] Date of Birth: 05-Apr-1935 Referring Provider (OT): Dr. Alysia Penna   Encounter Date: 12/18/2017  OT End of Session - 12/18/17 1637    Visit Number  2    Number of Visits  24    Date for OT Re-Evaluation  03/08/18    Authorization Type  Medicare, Tricare, UHC - pt will need PN every 10th visit    Authorization Time Period  90 days    Authorization - Visit Number  2    Authorization - Number of Visits  10    OT Start Time  1403    OT Stop Time  1448    OT Time Calculation (min)  45 min    Activity Tolerance  Patient tolerated treatment well       Past Medical History:  Diagnosis Date  . Atrial fibrillation ([place])    Post operative  . CAD (coronary artery disease)    s/p cabg  . CHF (congestive heart failure) ([place])   . Diverticulosis   . DJD (degenerative joint disease)   . DM (diabetes mellitus) ([place])   . History of anemia   . History of stroke   . Hyperlipidemia   . Hypertension   . 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 Peripheral vascular disease ([place])   . Polyneuropathy, diabetic ([place])   . Seizure disorder, complex partial ([place])   . Syncope and collapse     Past Surgical History:  Procedure Laterality Date  . CATARACT EXTRACTION W/ INTRAOCULAR LENS  IMPLANT, BILATERAL  06/2013   Dr. [name]  . CORONARY ARTERY BYPASS GRAFT  02/13/2004   4 vesel  . removal of sebaceous cyst from neck  08/2007   Dr. [name]  . TONSILLECTOMY      There were no vitals filed for this visit.  Subjective Assessment - 12/18/17 1409    Subjective   I like these goals.     Patient is accompained by:  Family member   oldest dtr and wife   Pertinent History  [name] 08/09/2017. DIscharged home on 09/22/2017 after inpt  rehab stay. Pt had HH PT and OT. PMH:      Patient Stated Goals  I want to get walking again.  I am an outdoor guy    Currently in Pain?  No/denies                   OT Treatments/Exercises (OP) - 12/18/17 0001      ADLs   UB Dressing  Addressed strategies for donning and doffing shirt. Pt able to doff with cues only.  Pt able to don with set up of shirt on lap and min assist to stabilize LUE while donning shirt over arm. Pt needed cues however was then able to don shirt without further assist.  Wife attempting to help several times and needed mod cues to wait and allow pt to be as independent as possible. Discussed how dressing himself will help with L neglect, trunk control, sitting balance and functional use of L hand.      Bathing  Addressed strategies for bathing.  With strategies, pt only needs min a to wash bottom leaning side to side as well as min a to wash feet.  Dtr to obtain LH sponge.  and pt able to return demonstrate stratigies.     ADL Comments  Reviewed goals and POC - pt in agreement and copy provided for pt and family      Neurological Re-education Exercises   Other Exercises 1  Initiated HEP to address forwrad weight shift , balance, trunk control and range of motion pain free for LUE. Famiily and pt able to return demonstrate.       Splinting   Splinting  Pt has prefabbed, off the shelf resting hand splint.  Pt reports he only wears it "sometimes" because his wrist and fingers hurt when he does.  Adjusted splint temporarily and will fabricate custom resting hand splint next session. Pt reported that splint was much more comfortable.              OT Education - 12/18/17 1636    Education Details  OT POC and goals    Person(s) Educated  Patient;Spouse;Child(ren)    Methods  Explanation;Handout    Comprehension  Verbalized understanding       OT Short Term Goals - 12/18/17 1530      OT SHORT TERM GOAL #1   Title  Pt and family will be mod I with  basic home exercise/home activities program for LUE, balance and functional mobility - 01/25/2018    Status  On-going      OT SHORT TERM GOAL #2   Title  Pt will be supervision with UB bathing    Status  On-going      OT SHORT TERM GOAL #3   Title  Pt will be min a for LB bathing    Status  On-going      OT SHORT TERM GOAL #4   Title  Pt will be close supervision for toilet transfers    Status  On-going      OT SHORT TERM GOAL #5   Title  Pt will be min a for tub bench transfers (assist with LLE)    Status  On-going      OT SHORT TERM GOAL #6   Title  Pt will tolerate shoulder flexion to 110* with no pain in supine in prep for HEP and low functional reach    Status  On-going      OT SHORT TERM GOAL #7   Title  Pt will demonstrate shoulder flexion to at least 30* in prep for low reach    Status  On-going      OT SHORT TERM GOAL #8   Title  Pt will demonstrate ability to hold light objects in L hand while manipulating with R hand (i.e opening containers) with min a    Status  On-going      OT SHORT TERM GOAL  #9   TITLE  Pt will be mod a for LB dressing    Status  On-going      OT SHORT TERM GOAL  #10   TITLE  Pt/family will be mod I with splint wear and care, prn    Status  On-going        OT Long Term Goals - 12/18/17 1633      OT LONG TERM GOAL #1   Title  Pt and family will be mod I with upgraded HEP/home activities program - 03/08/2018    Status  New      OT LONG TERM GOAL #2   Title  Pt will be min a for LB dressing    Status  New  OT LONG TERM GOAL #3   Title  Pt will be mod I with toilet transfers    Status  New      OT LONG TERM GOAL #4   Title  Pt will be mod I with clothing manipulation with toileting activities    Status  New      OT LONG TERM GOAL #5   Title  Pt will be min a for simple familiar hot meal prep    Status  New      OT LONG TERM GOAL #6   Title  Pt will demonstate ability to use LUE as stabilzer at least 25% of the time  during basic self care and fishing activities.    Status  New      OT LONG TERM GOAL #7   Title  Pt will be mod I with casting simple fishing pole    Status  New      OT LONG TERM GOAL #8   Title  Pt will demonstate adequate postural alignment and control to complete simple activities in standing without UE support.     Status  New            Plan - 12/18/17 1633    Clinical Impression Statement  Pt in agreement with goals and OT POC. Pt progressing toward goals.     Occupational Profile and client history currently impacting functional performance  PMH: husband, father, grand father, retiree.  Pt loves the outdoors and fishing.  PMH: CAD, S/P CABG, DM, HTN, HLD, AFIB, CHF, seizure, PVD, obesity, ETHO abuse, TIA in 03/2017.      Occupational performance deficits (Please refer to evaluation for details):  ADL's;IADL's;Leisure;Social Participation;Rest and Sleep    Current Impairments/barriers affecting progress:  medical history, severity of motor deficits    OT Frequency  2x / week    OT Duration  12 weeks    OT Treatment/Interventions  Self-care/ADL training;Aquatic Therapy;Electrical Stimulation;Moist Heat;Fluidtherapy;Ultrasound;Therapeutic exercise;Neuromuscular education;Energy conservation;DME and/or AE instruction;Passive range of motion;Manual Therapy;Functional Mobility Training;Splinting;Therapeutic activities;Patient/family education;Balance training    Plan  assess splint if pt brings it, manual therapy to address LUE shouler girdle alignment, NMR for LUE, balance, functional mobility, ADL's,     Consulted and Agree with Plan of Care  Patient;Family member/caregiver    Family Member Consulted  wife, dtr, grandson       Patient will benefit from skilled therapeutic intervention in order to improve the following deficits and impairments:  Abnormal gait, Decreased balance, Decreased range of motion, Decreased mobility, Decreased knowledge of use of DME, Decreased strength,  Difficulty walking, Impaired UE functional use, Impaired tone, Impaired flexibility, Pain  Visit Diagnosis: Hemiplegia and hemiparesis following cerebral infarction affecting left non-dominant side (HCC)  Unsteadiness on feet  Spastic hemiplegia of left nondominant side as late effect of cerebral infarction (HCC)  Muscle weakness (generalized)  Abnormal posture  Chronic left shoulder pain  Pain in left wrist  Stiffness of left shoulder, not elsewhere classified  Stiffness of left wrist, not elsewhere classified    Problem List Patient Active Problem List   Diagnosis Date Noted  . 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 Acute blood loss anemia   . Chronic diastolic (congestive) heart failure ([place])   . Acute idiopathic gout of right knee   . Primary osteoarthritis of right knee   . Hemiparesis affecting left side as late effect of cerebrovascular accident (CVA) ([place])   . Diabetes mellitus type 2 in obese (  [place])   . Small vessel disease ([place]) 08/11/2017  . Left hemiparesis ([place])   . Benign essential hypertension with delivery   . CVA (cerebral vascular accident) ([place]) 08/10/2017  . 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 of localized onset, not intractable, without status epilepticus ([place]) 06/20/2017  . Primary osteoarthritis of both knees 05/11/2017  . Stroke (cerebrum) ([place]) 03/21/2017  . Type 2 diabetes mellitus with complication, with long-term current use of insulin ([place]) 03/27/2015  . Atherosclerosis of native coronary artery of native heart without angina pectoris 03/27/2015  . Palpitations 11/04/2014  . S/P CABG (coronary artery bypass graft) 11/04/2014  . Atrial fibrillation, unspecified   . History of stroke 09/04/2014  . Obesity (BMI 30-39.9) 11/29/2013  . History of gout 05/16/2013  . Congestive heart failure ([place]) 04/20/2009  . DYSPNEA 02/11/2009  . CARDIOVASCULAR FUNCTION STUDY,  ABNORMAL 01/29/2009  . NECK PAIN 08/03/2007  . ANEMIA / OTHER 05/16/2007  . Coronary atherosclerosis 05/16/2007  . PAROXYSMAL ATRIAL FIBRILLATION 05/16/2007  . Subcortical infarction ([place]) 05/16/2007  . Hyperlipidemia 03/30/2007  . DEGENERATIVE JOINT DISEASE 03/30/2007  . PAROTID LESION, UNSPECIFIED 09/29/2006  . Partial epilepsy with impairment of consciousness ([place]) 09/29/2006  . Diabetic polyneuropathy ([place]) 09/29/2006  . Essential hypertension 09/29/2006  . PERIPHERAL VASCULAR DISEASE 09/29/2006  . DIVERTICULOSIS 09/29/2006    Quay Burow, OTR/L 12/18/2017, 4:38 PM  [facility] [address] [place], [place], [address] Phone: [phone]   Fax:  [phone]  Name: Fred [name] MRN: [id number] Date of Birth: 1935-10-10

## 2017-12-19 ENCOUNTER — Ambulatory Visit (HOSPITAL_BASED_OUTPATIENT_CLINIC_OR_DEPARTMENT_OTHER): Payer: Medicare Other | Admitting: Physical Medicine & Rehabilitation

## 2017-12-19 ENCOUNTER — Other Ambulatory Visit: Payer: Self-pay

## 2017-12-19 ENCOUNTER — Other Ambulatory Visit: Payer: Self-pay | Admitting: *Deleted

## 2017-12-19 ENCOUNTER — Other Ambulatory Visit: Payer: Self-pay | Admitting: Family Medicine

## 2017-12-19 ENCOUNTER — Encounter: Payer: Medicare Other | Attending: Physical Medicine & Rehabilitation

## 2017-12-19 ENCOUNTER — Encounter: Payer: Self-pay | Admitting: Physical Medicine & Rehabilitation

## 2017-12-19 VITALS — BP 111/67 | HR 72

## 2017-12-19 DIAGNOSIS — I110 Hypertensive heart disease with heart failure: Secondary | ICD-10-CM | POA: Diagnosis not present

## 2017-12-19 DIAGNOSIS — I2510 Atherosclerotic heart disease of native coronary artery without angina pectoris: Secondary | ICD-10-CM

## 2017-12-19 DIAGNOSIS — I5032 Chronic diastolic (congestive) heart failure: Secondary | ICD-10-CM | POA: Insufficient documentation

## 2017-12-19 DIAGNOSIS — E785 Hyperlipidemia, unspecified: Secondary | ICD-10-CM | POA: Diagnosis not present

## 2017-12-19 DIAGNOSIS — Z951 Presence of aortocoronary bypass graft: Secondary | ICD-10-CM | POA: Insufficient documentation

## 2017-12-19 DIAGNOSIS — E1151 Type 2 diabetes mellitus with diabetic peripheral angiopathy without gangrene: Secondary | ICD-10-CM | POA: Insufficient documentation

## 2017-12-19 DIAGNOSIS — G40909 Epilepsy, unspecified, not intractable, without status epilepticus: Secondary | ICD-10-CM | POA: Insufficient documentation

## 2017-12-19 DIAGNOSIS — Z87891 Personal history of nicotine dependence: Secondary | ICD-10-CM | POA: Insufficient documentation

## 2017-12-19 DIAGNOSIS — I639 Cerebral infarction, unspecified: Secondary | ICD-10-CM | POA: Diagnosis not present

## 2017-12-19 DIAGNOSIS — I69354 Hemiplegia and hemiparesis following cerebral infarction affecting left non-dominant side: Secondary | ICD-10-CM | POA: Diagnosis not present

## 2017-12-19 DIAGNOSIS — Z794 Long term (current) use of insulin: Secondary | ICD-10-CM | POA: Insufficient documentation

## 2017-12-19 DIAGNOSIS — E1142 Type 2 diabetes mellitus with diabetic polyneuropathy: Secondary | ICD-10-CM | POA: Insufficient documentation

## 2017-12-19 DIAGNOSIS — I6523 Occlusion and stenosis of bilateral carotid arteries: Secondary | ICD-10-CM | POA: Diagnosis not present

## 2017-12-19 DIAGNOSIS — Z79899 Other long term (current) drug therapy: Secondary | ICD-10-CM | POA: Insufficient documentation

## 2017-12-19 DIAGNOSIS — Z7982 Long term (current) use of aspirin: Secondary | ICD-10-CM | POA: Diagnosis not present

## 2017-12-19 NOTE — Progress Notes (Signed)
Subjective:    Patient ID: Fred [name], male    DOB: Apr 25, 1935, 82 y.o.   MRN: [id number] 82 year old right-handed male with history of alcohol use, hypertension, seizure disorder as well as CVA, CAD with CABG, diastolic congestive heart failure.  Lives with spouse, independent prior to admission.  Presented  08/09/2017 with left-sided weakness, facial droop, slurred speech.  Cranial CT scan negative.  CT angiogram of head and neck showed mild less than 50% proximal right ICA stenosis and moderate to severe 70% proximal left ICA stenosis.  MRI showed  subcentimeter acute versus early subacute infarction within the right posterior limb of internal capsule.  Echocardiogram with ejection fraction of 98%, grade I diastolic dysfunction.  Maintained on aspirin and Plavix x3 weeks, then aspirin alone.  HPI Patient has completed home health PT OT and is now attending outpatient therapy at [facility] neuro rehab. He is having some shoulder pain with overhead stretching of the shoulder but otherwise at rest does not bother him. He is getting increased tightness of the left wrist and fingers.  His fingers are straight when he takes off his wrist hand orthosis in the morning but then during the day become more curled up.  He has some pain associated with this. No further falls Wife drove him here today.  Family was somewhat concerned about wife's driving abilities since she has not driven in 2 years and therefore they followed her. Pain Inventory Average Pain 0 Pain Right Now 0 My pain is no pain  In the last 24 hours, has pain interfered with the following? General activity no pain Relation with others 0 Enjoyment of life 0 What TIME of day is your pain at its worst? no pain Sleep (in general) Good  Pain is worse with: no pain Pain improves with: no pain Relief from Meds: no meds  Mobility use a wheelchair  Function retired  Neuro/Psych No problems in this area  Prior Studies Any  changes since last visit?  no  Physicians involved in your care Any changes since last visit?  no   Family History  Problem Relation Age of Onset  . Epilepsy Father   . Stroke Mother   . Lung cancer Unknown    Social History   Socioeconomic History  . Marital status: Married    Spouse name: Abshir Paolini  . Number of children: 3  . Years of education: Not on file  . Highest education level: Not on file  Occupational History  . 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 Financial resource strain: Not on file  . Food insecurity:    Worry: Not on file    Inability: Not on file  . Transportation needs:    Medical: Not on file    Non-medical: Not on file  Tobacco Use  . Smoking status: Former Smoker    Packs/day: 1.00    Years: 22.00    Pack years: 22.00    Types: Cigarettes    Last attempt to quit: 03/14/1977    Years since quitting: 40.7  . Smokeless tobacco: Never Used  Substance and Sexual Activity  . Alcohol use: Yes    Comment: Pt has not had alcohol since 03/2017 CVA  . Drug use: No  . Sexual activity: Never  Lifestyle  . Physical activity:    Days per week: Not on file    Minutes per session: Not on file  .  Stress: Not on file  Relationships  . Social connections:    Talks on phone: Not on file    Gets together: Not on file    Attends religious service: Not on file    Active member of club or organization: Not on file    Attends meetings of clubs or organizations: Not on file    Relationship status: Not on file  Other Topics Concern  . Not on file  Social History Narrative   3 caffeinated drinks per day. Mostly coffee. He walks 15 minutes 3 days per week. Quit smoking in 1978.      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    Past Surgical History:  Procedure Laterality Date  . CATARACT EXTRACTION W/  INTRAOCULAR LENS  IMPLANT, BILATERAL  06/2013   Dr. [name]  . CORONARY ARTERY BYPASS GRAFT  02/13/2004   4 vesel  . removal of sebaceous cyst from neck  08/2007   Dr. [name]  . TONSILLECTOMY     Past Medical History:  Diagnosis Date  . Atrial fibrillation ([place])    Post operative  . CAD (coronary artery disease)    s/p cabg  . CHF (congestive heart failure) ([place])   . Diverticulosis   . DJD (degenerative joint disease)   . DM (diabetes mellitus) ([place])   . History of anemia   . History of stroke   . Hyperlipidemia   . Hypertension   . 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 Peripheral vascular disease ([place])   . Polyneuropathy, diabetic ([place])   . Seizure disorder, complex partial ([place])   . Syncope and collapse    There were no vitals taken for this visit.  Opioid Risk Score:   Fall Risk Score:  `1  Depression screen PHQ 2/9  Depression screen [facility] 2/9 12/19/2017 09/26/2017 09/25/2017 02/08/2017 05/26/2016 05/05/2016 12/22/2015  Decreased Interest 0 0 1 0 0 0 0  Down, Depressed, Hopeless 0 2 1 0 0 0 0  PHQ - 2 Score 0 2 2 0 0 0 0  Altered sleeping - 2 0 - - - -  Tired, decreased energy - 2 2 - - - -  Change in appetite - 2 3 - - - -  Feeling bad or failure about yourself  - 0 0 - - - -  Trouble concentrating - 0 0 - - - -  Moving slowly or fidgety/restless - 0 0 - - - -  Suicidal thoughts - 0 0 - - - -  PHQ-9 Score - 8 7 - - - -  Difficult doing work/chores - Somewhat difficult Somewhat difficult - - - -  Some recent data might be hidden    Review of Systems     Objective:   Physical Exam Motor strength is 2- at the left deltoid biceps finger flexors 0 at the finger extensors and wrist extensors on the left 3- at the left hip flexors 3 at the knee extensors and trace ankle dorsiflexor, 0 knee flexors Right side is 5/5 in the deltoid bicep tricep grip hip flexor knee extensor ankle dorsiflexor. Sensation reported as equal to light touch bilateral  upper and lower limb Speech is without dysarthria Visual fields appear intact  Gait not tested he does not have his hemiwalker with him today. Tone MAS 3 at the left shoulder internal rotators MAS 3  left elbow flexors MAS 3 left wrist flexors MAS 2/3 left finger flexors digits 2 through 5    Assessment & Plan:  #1.  Right posterior limb internal capsule infarct.  We discussed that the most likely etiology is thrombosis due to chronic hypertension Overall has had some improvement with his left hemiparesis but is starting to develop increasing spasticity primarily affecting the left pectoralis, elbow flexors and wrist and finger flexors. We discussed treatment options.  Given the focal nature of his spasticity would recommend botulinum toxin injection to the following muscles  Botox  Left FDS 25 Left FDP 25 Left Pec 75 L FCR 25 L biceps 25 Left Brachiorad 25

## 2017-12-19 NOTE — Telephone Encounter (Signed)
Refills, never prescribed by Dr Madilyn Fireman.

## 2017-12-19 NOTE — Patient Instructions (Signed)
OnabotulinumtoxinA injection (Medical Use) What is this medicine? ONABOTULINUMTOXINA (o na BOTT you lye num tox in eh) is a neuro-muscular blocker. This medicine is used to treat crossed eyes, eyelid spasms, severe neck muscle spasms, ankle and toe muscle spasms, and elbow, wrist, and finger muscle spasms. It is also used to treat excessive underarm sweating, to prevent chronic migraine headaches, and to treat loss of bladder control due to neurologic conditions such as multiple sclerosis or spinal cord injury. This medicine may be used for other purposes; ask your health care provider or pharmacist if you have questions. COMMON BRAND NAME(S): Botox What should I tell my health care provider before I take this medicine? They need to know if you have any of these conditions: -breathing problems -cerebral palsy spasms -difficulty urinating -heart problems -history of surgery where this medicine is going to be used -infection at the site where this medicine is going to be used -myasthenia gravis or other neurologic disease -nerve or muscle disease -surgery plans -take medicines that treat or prevent blood clots -thyroid problems -an unusual or allergic reaction to botulinum toxin, albumin, other medicines, foods, dyes, or preservatives -pregnant or trying to get pregnant -breast-feeding How should I use this medicine? This medicine is for injection into a muscle. It is given by a health care professional in a hospital or clinic setting. Talk to your pediatrician regarding the use of this medicine in children. While this drug may be prescribed for children as young as 12 years old for selected conditions, precautions do apply. Overdosage: If you think you have taken too much of this medicine contact a poison control center or emergency room at once. NOTE: This medicine is only for you. Do not share this medicine with others. What if I miss a dose? This does not apply. What may interact with  this medicine? -aminoglycoside antibiotics like gentamicin, neomycin, tobramycin -muscle relaxants -other botulinum toxin injections This list may not describe all possible interactions. Give your health care provider a list of all the medicines, herbs, non-prescription drugs, or dietary supplements you use. Also tell them if you smoke, drink alcohol, or use illegal drugs. Some items may interact with your medicine. What should I watch for while using this medicine? Visit your doctor for regular check ups. This medicine will cause weakness in the muscle where it is injected. Tell your doctor if you feel unusually weak in other muscles. Get medical help right away if you have problems with breathing, swallowing, or talking. This medicine might make your eyelids droop or make you see blurry or double. If you have weak muscles or trouble seeing do not drive a car, use machinery, or do other dangerous activities. This medicine contains albumin from human blood. It may be possible to pass an infection in this medicine, but no cases have been reported. Talk to your doctor about the risks and benefits of this medicine. If your activities have been limited by your condition, go back to your regular routine slowly after treatment with this medicine. What side effects may I notice from receiving this medicine? Side effects that you should report to your doctor or health care professional as soon as possible: -allergic reactions like skin rash, itching or hives, swelling of the face, lips, or tongue -breathing problems -changes in vision -chest pain or tightness -eye irritation, pain -fast, irregular heartbeat -infection -numbness -speech problems -swallowing problems -unusual weakness Side effects that usually do not require medical attention (report to your doctor or health care   professional if they continue or are bothersome): -bruising or pain at site where injected -drooping eyelid -dry eyes or  mouth -headache -muscles aches, pains -sensitivity to light -tearing This list may not describe all possible side effects. Call your doctor for medical advice about side effects. You may report side effects to FDA at 1-800-FDA-1088. Where should I keep my medicine? This drug is given in a hospital or clinic and will not be stored at home. NOTE: This sheet is a summary. It may not cover all possible information. If you have questions about this medicine, talk to your doctor, pharmacist, or health care provider.  2018 Elsevier/Gold Standard (2014-04-08 15:43:53)  

## 2017-12-20 ENCOUNTER — Encounter: Payer: Self-pay | Admitting: Physical Therapy

## 2017-12-20 ENCOUNTER — Ambulatory Visit: Payer: Medicare Other | Admitting: Physical Therapy

## 2017-12-20 VITALS — BP 141/50 | HR 72

## 2017-12-20 DIAGNOSIS — R2689 Other abnormalities of gait and mobility: Secondary | ICD-10-CM

## 2017-12-20 DIAGNOSIS — I69354 Hemiplegia and hemiparesis following cerebral infarction affecting left non-dominant side: Secondary | ICD-10-CM

## 2017-12-20 DIAGNOSIS — R2681 Unsteadiness on feet: Secondary | ICD-10-CM

## 2017-12-20 DIAGNOSIS — G8929 Other chronic pain: Secondary | ICD-10-CM | POA: Diagnosis not present

## 2017-12-20 DIAGNOSIS — M6281 Muscle weakness (generalized): Secondary | ICD-10-CM

## 2017-12-20 DIAGNOSIS — R293 Abnormal posture: Secondary | ICD-10-CM

## 2017-12-20 DIAGNOSIS — M25512 Pain in left shoulder: Secondary | ICD-10-CM | POA: Diagnosis not present

## 2017-12-20 NOTE — Therapy (Signed)
[facility] [address] [place], [place], [address] Phone: [phone]   Fax:  [phone]  Physical Therapy Treatment  Patient Details  Name: Fred [name] MRN: [id number] Date of Birth: Dec 04, 1935 Referring Provider (PT): Dr. Letta Pate   Encounter Date: 12/20/2017  PT End of Session - 12/20/17 1705    Visit Number  2    Number of Visits  17    Date for PT Re-Evaluation  02/12/18    Authorization Type  Medicare, UHC, Tricare (no PTAs). Progress note every 10th visit.     PT Start Time  1315    PT Stop Time  1400    PT Time Calculation (min)  45 min    Equipment Utilized During Treatment  Gait belt    Activity Tolerance  Patient tolerated treatment well;Patient limited by fatigue    Behavior During Therapy  WFL for tasks assessed/performed       Past Medical History:  Diagnosis Date  . Atrial fibrillation ([place])    Post operative  . CAD (coronary artery disease)    s/p cabg  . CHF (congestive heart failure) ([place])   . Diverticulosis   . DJD (degenerative joint disease)   . DM (diabetes mellitus) ([place])   . History of anemia   . History of stroke   . Hyperlipidemia   . Hypertension   . 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 Peripheral vascular disease ([place])   . Polyneuropathy, diabetic ([place])   . Seizure disorder, complex partial ([place])   . Syncope and collapse     Past Surgical History:  Procedure Laterality Date  . CATARACT EXTRACTION W/ INTRAOCULAR LENS  IMPLANT, BILATERAL  06/2013   Dr. [name]  . CORONARY ARTERY BYPASS GRAFT  02/13/2004   4 vesel  . removal of sebaceous cyst from neck  08/2007   Dr. [name]  . TONSILLECTOMY      Vitals:   12/20/17 1323  BP: (!) 141/50  Pulse: 72    Subjective Assessment - 12/20/17 1403    Subjective  Wife stated he is walking at home with hemi-walker  20' x 4 bouts a day; he said he feels his leg is improving but not his arm    Pertinent History  DM with polyneuropathy, hx of cataracts, hx of CVA in 03/2017 and again 07/2017, OA in R knee, DJD, HTN, HLD, coronary atherosclerosis, a-fib, CHF, PVD, parotid lesion, diverticulitis, partial epilepsy, anemia    Patient Stated Goals  "I want to get back to walking so I can get outside."     Currently in Pain?  No/denies         There ex  Standing in parallel bars;  1 Standing with both hands engaged on bars;  X 5 min;  Max effort;  2. Stepping forward back with left stance x 10 x2  3. Stepping forward back with right stance x 10  X 2 --manual assist for correct mvt pattern- left swing 4. Seated w/o back support; right hand holding left/interlace fingers; reaching overhead-core ex's 2 x 10 ( note had him using the hand throughout)               [facility] Adult PT Treatment/Exercise - 12/20/17 0001      Transfers   Transfers  Stand to Sit    Sit to Stand  4: Min guard    Stand to Sit  4: Min guard    Comments  multi bouts in parallell bars with balance ex's       Ambulation/Gait   Ambulation/Gait  Yes    Ambulation/Gait Assistance  3: Mod assist    Ambulation/Gait Assistance Details  at end of session we attempted to do TUG; he only made about 5 ' and nearly fell he was too fatigued to advance the left leg effectively     Assistive device  Hemi-walker    Gait Pattern  Step-to pattern    Ambulation Surface  Level;Indoor               PT Short Term Goals - 12/14/17 1228      PT SHORT TERM GOAL #1   Title  Pt will be IND in HEP to improve the deficits listed above. TARGET DATE FOR ALL STGS: 01/11/18    Status  New      PT SHORT TERM GOAL #2   Title  Pt will be able to standing without UE support and feet apart for 2 minutes, with S, to perform ADLs safely.     Status  New      PT SHORT TERM GOAL #3   Title  Perform TUG, gait speed, and BERG-write STGs/LTGs, once tolerated.     Status  New      PT SHORT TERM GOAL #4   Title  Pt will amb.  100' with LRAD, over even terrain, at MOD I level to improve functional mobility.     Status  New        PT Long Term Goals - 12/14/17 1229      PT LONG TERM GOAL #1   Title  Pt will verbalize understanding of CVA risk factors and s/s of CVA to reduce risk of additional CVA. TARGET DATE FOR ALL LTGS: 02/08/18    Status  New      PT LONG TERM GOAL #2   Title  Pt will amb. 200' outdoors, with LRAD, with S, in order to walk in his yard safely.     Status  New      PT LONG TERM GOAL #3   Title  Trial manual w/c outdoors and write goal as indicated.     Status  New            Plan - 12/20/17 1354    Clinical Impression Statement  Pt fatigues quickly; poor left UE and LE control; needs to maximize his weight bearing time at home each day; has some ex's from HHPT but as we talked, he revealed he does a lot of sitting throught the day; encouraged him to get up every half hour and try to engage the left UE as well ; He tolerated the ex's well;  High fall risk    Rehab Potential  Good    Clinical Impairments Affecting Rehab Potential  see above.     PT Frequency  2x / week    PT Duration  8 weeks    PT Treatment/Interventions  ADLs/Self Care Home Management;Biofeedback;Canalith Repostioning;Electrical Stimulation;Therapeutic activities;Therapeutic exercise;Manual techniques;Vestibular;Wheelchair mobility training;Functional mobility training;Orthotic Fit/Training;Stair training;Gait training;Patient/family education;DME Instruction;Neuromuscular re-education;Balance training    PT Next Visit Plan  Cont to work on standing balnace and gait quality; attempted TUG today at end of session he was too tired to walk effectively > 5'     Consulted and Agree with Plan of Care  Patient;Family member/caregiver    Family Member Consulted  wife       Patient will benefit from skilled  therapeutic intervention in order to improve the following deficits and impairments:  Abnormal gait, Decreased  endurance, Decreased knowledge of use of DME, Decreased strength, Impaired UE functional use, Decreased balance, Decreased mobility, Decreased range of motion, Decreased coordination, Impaired flexibility, Postural dysfunction  Visit Diagnosis: Hemiplegia and hemiparesis following cerebral infarction affecting left non-dominant side (HCC)  Unsteadiness on feet  Spastic hemiplegia of left nondominant side as late effect of cerebral infarction (HCC)  Muscle weakness (generalized)  Abnormal posture  Other abnormalities of gait and mobility     Problem List Patient Active Problem List   Diagnosis Date Noted  . 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 Acute blood loss anemia   . Chronic diastolic (congestive) heart failure ([place])   . Acute idiopathic gout of right knee   . Primary osteoarthritis of right knee   . Hemiparesis affecting left side as late effect of cerebrovascular accident (CVA) ([place])   . Diabetes mellitus type 2 in obese ([place])   . Small vessel disease ([place]) 08/11/2017  . Left hemiparesis ([place])   . Benign essential hypertension with delivery   . CVA (cerebral vascular accident) ([place]) 08/10/2017  . 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 of localized onset, not intractable, without status epilepticus ([place]) 06/20/2017  . Primary osteoarthritis of both knees 05/11/2017  . Stroke (cerebrum) ([place]) 03/21/2017  . Type 2 diabetes mellitus with complication, with long-term current use of insulin ([place]) 03/27/2015  . Atherosclerosis of native coronary artery of native heart without angina pectoris 03/27/2015  . Palpitations 11/04/2014  . S/P CABG (coronary artery bypass graft) 11/04/2014  . Atrial fibrillation, unspecified   . History of stroke 09/04/2014  . Obesity (BMI 30-39.9) 11/29/2013  . History of gout 05/16/2013  . Congestive heart failure ([place]) 04/20/2009  . DYSPNEA 02/11/2009  .  CARDIOVASCULAR FUNCTION STUDY, ABNORMAL 01/29/2009  . NECK PAIN 08/03/2007  . ANEMIA / OTHER 05/16/2007  . Coronary atherosclerosis 05/16/2007  . PAROXYSMAL ATRIAL FIBRILLATION 05/16/2007  . Subcortical infarction ([place]) 05/16/2007  . Hyperlipidemia 03/30/2007  . DEGENERATIVE JOINT DISEASE 03/30/2007  . PAROTID LESION, UNSPECIFIED 09/29/2006  . Partial epilepsy with impairment of consciousness ([place]) 09/29/2006  . Diabetic polyneuropathy ([place]) 09/29/2006  . Essential hypertension 09/29/2006  . PERIPHERAL VASCULAR DISEASE 09/29/2006  . DIVERTICULOSIS 09/29/2006    Rosaura Carpenter D PT DPT  12/20/2017, 5:08 PM  [facility] [address] [place], [place], [address] Phone: [phone]   Fax:  [phone]  Name: Fred [name] MRN: [id number] Date of Birth: 1936-03-02

## 2017-12-21 ENCOUNTER — Ambulatory Visit: Payer: Medicare Other | Admitting: Physical Therapy

## 2017-12-21 ENCOUNTER — Ambulatory Visit: Payer: Medicare Other | Admitting: Occupational Therapy

## 2017-12-25 ENCOUNTER — Ambulatory Visit: Payer: Medicare Other | Admitting: Physical Medicine & Rehabilitation

## 2017-12-26 ENCOUNTER — Ambulatory Visit: Payer: Medicare Other | Admitting: Occupational Therapy

## 2017-12-26 ENCOUNTER — Encounter: Payer: Self-pay | Admitting: Occupational Therapy

## 2017-12-26 DIAGNOSIS — I69354 Hemiplegia and hemiparesis following cerebral infarction affecting left non-dominant side: Secondary | ICD-10-CM | POA: Diagnosis not present

## 2017-12-26 DIAGNOSIS — R293 Abnormal posture: Secondary | ICD-10-CM | POA: Diagnosis not present

## 2017-12-26 DIAGNOSIS — M6281 Muscle weakness (generalized): Secondary | ICD-10-CM

## 2017-12-26 DIAGNOSIS — M25632 Stiffness of left wrist, not elsewhere classified: Secondary | ICD-10-CM

## 2017-12-26 DIAGNOSIS — M25512 Pain in left shoulder: Secondary | ICD-10-CM

## 2017-12-26 DIAGNOSIS — R2681 Unsteadiness on feet: Secondary | ICD-10-CM

## 2017-12-26 DIAGNOSIS — M25532 Pain in left wrist: Secondary | ICD-10-CM

## 2017-12-26 DIAGNOSIS — M25612 Stiffness of left shoulder, not elsewhere classified: Secondary | ICD-10-CM

## 2017-12-26 DIAGNOSIS — G8929 Other chronic pain: Secondary | ICD-10-CM

## 2017-12-26 NOTE — Therapy (Signed)
[facility] [address] [place], [place], [address] Phone: [phone]   Fax:  [phone]  Occupational Therapy Treatment  Patient Details  Name: Fred [name] MRN: [id number] Date of Birth: Dec 10, 1935 Referring Provider (OT): Dr. Alysia Penna   Encounter Date: 12/26/2017  OT End of Session - 12/26/17 1717    Visit Number  3    Number of Visits  24    Date for OT Re-Evaluation  03/08/18    Authorization Time Period  90 days    Authorization - Visit Number  3    Authorization - Number of Visits  10    OT Start Time  9767    OT Stop Time  1533    OT Time Calculation (min)  38 min    Activity Tolerance  Patient tolerated treatment well    Behavior During Therapy  [facility] for tasks assessed/performed       Past Medical History:  Diagnosis Date  . Atrial fibrillation ([place])    Post operative  . CAD (coronary artery disease)    s/p cabg  . CHF (congestive heart failure) ([place])   . Diverticulosis   . DJD (degenerative joint disease)   . DM (diabetes mellitus) ([place])   . History of anemia   . History of stroke   . Hyperlipidemia   . Hypertension   . 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 Peripheral vascular disease ([place])   . Polyneuropathy, diabetic ([place])   . Seizure disorder, complex partial ([place])   . Syncope and collapse     Past Surgical History:  Procedure Laterality Date  . CATARACT EXTRACTION W/ INTRAOCULAR LENS  IMPLANT, BILATERAL  06/2013   Dr. [name]  . CORONARY ARTERY BYPASS GRAFT  02/13/2004   4 vesel  . removal of sebaceous cyst from neck  08/2007   Dr. [name]  . TONSILLECTOMY      There were no vitals filed for this visit.  Subjective Assessment - 12/26/17 1459    Subjective   I want to walk, and use my left side    Patient is accompained by:  Family member    Pertinent History  [name] 08/09/2017. DIscharged home on 09/22/2017 after inpt rehab stay. Pt had HH PT  and OT. PMH:      Patient Stated Goals  I want to get walking again.  I am an outdoor guy    Currently in Pain?  No/denies                   OT Treatments/Exercises (OP) - 12/26/17 0001      Neurological Re-education Exercises   Other Exercises 1  Neuromuscular reeducation to address midline orientation and alignment for functional sit to stand and stand to sit.  Patient pushing strongly to his weaker left side with standing activities.  Patient with strong posterior bias in standing, although with target in front, able to weight shift toward target.  Patient stands with flexed knee but with overt cueing able to stand on extended left leg.  Patient with report of wrist discomfort today.  Patient with increased tension in wrist and digits, and may benefit from custom orthosis.  Patient and wife forgot to bring night splint - will bring to next session.  Patient repsonded well to trastiona nd realignment in wrist before attempting placing left hand onto surface.  Encouraged more symmetrical sit to stand position by pushing from thigh versus pushing with  right hand on mat.  Patient abel to sit on physioball to feel lower trunk initiated movement laterally, anterior posterior shifts.               OT Education - 12/26/17 1717    Education Details  discussed that patient may benefit from custom WHO    Person(s) Educated  Patient;Spouse    Methods  Explanation    Comprehension  Verbalized understanding;Need further instruction       OT Short Term Goals - 12/18/17 1530      OT SHORT TERM GOAL #1   Title  Pt and family will be mod I with basic home exercise/home activities program for LUE, balance and functional mobility - 01/25/2018    Status  On-going      OT SHORT TERM GOAL #2   Title  Pt will be supervision with UB bathing    Status  On-going      OT SHORT TERM GOAL #3   Title  Pt will be min a for LB bathing    Status  On-going      OT SHORT TERM GOAL #4   Title  Pt  will be close supervision for toilet transfers    Status  On-going      OT SHORT TERM GOAL #5   Title  Pt will be min a for tub bench transfers (assist with LLE)    Status  On-going      OT SHORT TERM GOAL #6   Title  Pt will tolerate shoulder flexion to 110* with no pain in supine in prep for HEP and low functional reach    Status  On-going      OT SHORT TERM GOAL #7   Title  Pt will demonstrate shoulder flexion to at least 30* in prep for low reach    Status  On-going      OT SHORT TERM GOAL #8   Title  Pt will demonstrate ability to hold light objects in L hand while manipulating with R hand (i.e opening containers) with min a    Status  On-going      OT SHORT TERM GOAL  #9   TITLE  Pt will be mod a for LB dressing    Status  On-going      OT SHORT TERM GOAL  #10   TITLE  Pt/family will be mod I with splint wear and care, prn    Status  On-going        OT Long Term Goals - 12/18/17 1633      OT LONG TERM GOAL #1   Title  Pt and family will be mod I with upgraded HEP/home activities program - 03/08/2018    Status  New      OT LONG TERM GOAL #2   Title  Pt will be min a for LB dressing    Status  New      OT LONG TERM GOAL #3   Title  Pt will be mod I with toilet transfers    Status  New      OT LONG TERM GOAL #4   Title  Pt will be mod I with clothing manipulation with toileting activities    Status  New      OT LONG TERM GOAL #5   Title  Pt will be min a for simple familiar hot meal prep    Status  New      OT LONG TERM GOAL #6  Title  Pt will demonstate ability to use LUE as stabilzer at least 25% of the time during basic self care and fishing activities.    Status  New      OT LONG TERM GOAL #7   Title  Pt will be mod I with casting simple fishing pole    Status  New      OT LONG TERM GOAL #8   Title  Pt will demonstate adequate postural alignment and control to complete simple activities in standing without UE support.     Status  New             Plan - 12/26/17 1718    Clinical Impression Statement  Patient with significant sensory perceptual neuromuscular deficits, but very willing to work, and is eager for improved functional use of left side    Occupational Profile and client history currently impacting functional performance  PMH: husband, father, grand father, retiree.  Pt loves the outdoors and fishing.  PMH: CAD, S/P CABG, DM, HTN, HLD, AFIB, CHF, seizure, PVD, obesity, ETHO abuse, TIA in 03/2017.      Occupational performance deficits (Please refer to evaluation for details):  ADL's;IADL's;Leisure;Social Participation;Rest and Sleep    Rehab Potential  Good    Current Impairments/barriers affecting progress:  medical history, severity of motor deficits    OT Frequency  2x / week    OT Duration  12 weeks    OT Treatment/Interventions  Self-care/ADL training;Aquatic Therapy;Electrical Stimulation;Moist Heat;Fluidtherapy;Ultrasound;Therapeutic exercise;Neuromuscular education;Energy conservation;DME and/or AE instruction;Passive range of motion;Manual Therapy;Functional Mobility Training;Splinting;Therapeutic activities;Patient/family education;Balance training    Plan  assess splint if pt brings it, manual therapy to address LUE shouler girdle alignment, NMR for LUE, balance, functional mobility, ADL's,     Clinical Decision Making  Multiple treatment options, significant modification of task necessary    Consulted and Agree with Plan of Care  Patient;Family member/caregiver    Family Member Consulted  wife       Patient will benefit from skilled therapeutic intervention in order to improve the following deficits and impairments:  Abnormal gait, Decreased balance, Decreased range of motion, Decreased mobility, Decreased knowledge of use of DME, Decreased strength, Difficulty walking, Impaired UE functional use, Impaired tone, Impaired flexibility, Pain  Visit Diagnosis: Hemiplegia and hemiparesis following cerebral  infarction affecting left non-dominant side (HCC)  Unsteadiness on feet  Spastic hemiplegia of left nondominant side as late effect of cerebral infarction (HCC)  Muscle weakness (generalized)  Abnormal posture  Chronic left shoulder pain  Pain in left wrist  Stiffness of left shoulder, not elsewhere classified  Stiffness of left wrist, not elsewhere classified    Problem List Patient Active Problem List   Diagnosis Date Noted  . 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 Acute blood loss anemia   . Chronic diastolic (congestive) heart failure ([place])   . Acute idiopathic gout of right knee   . Primary osteoarthritis of right knee   . Hemiparesis affecting left side as late effect of cerebrovascular accident (CVA) ([place])   . Diabetes mellitus type 2 in obese ([place])   . Small vessel disease ([place]) 08/11/2017  . Left hemiparesis ([place])   . Benign essential hypertension with delivery   . CVA (cerebral vascular accident) ([place]) 08/10/2017  . 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 of localized onset, not intractable, without status epilepticus ([place]) 06/20/2017  . Primary osteoarthritis of both knees 05/11/2017  . Stroke (cerebrum) ([place]) 03/21/2017  . Type  2 diabetes mellitus with complication, with long-term current use of insulin ([place]) 03/27/2015  . Atherosclerosis of native coronary artery of native heart without angina pectoris 03/27/2015  . Palpitations 11/04/2014  . S/P CABG (coronary artery bypass graft) 11/04/2014  . Atrial fibrillation, unspecified   . History of stroke 09/04/2014  . Obesity (BMI 30-39.9) 11/29/2013  . History of gout 05/16/2013  . Congestive heart failure ([place]) 04/20/2009  . DYSPNEA 02/11/2009  . CARDIOVASCULAR FUNCTION STUDY, ABNORMAL 01/29/2009  . NECK PAIN 08/03/2007  . ANEMIA / OTHER 05/16/2007  . Coronary atherosclerosis 05/16/2007  . PAROXYSMAL ATRIAL FIBRILLATION  05/16/2007  . Subcortical infarction ([place]) 05/16/2007  . Hyperlipidemia 03/30/2007  . DEGENERATIVE JOINT DISEASE 03/30/2007  . PAROTID LESION, UNSPECIFIED 09/29/2006  . Partial epilepsy with impairment of consciousness ([place]) 09/29/2006  . Diabetic polyneuropathy ([place]) 09/29/2006  . Essential hypertension 09/29/2006  . PERIPHERAL VASCULAR DISEASE 09/29/2006  . DIVERTICULOSIS 09/29/2006    Mariah Milling, OTR/L 12/26/2017, 5:20 PM  [facility] [address] [place], [place], [address] Phone: [phone]   Fax:  [phone]  Name: CHRISHAUN SASSO MRN: [id number] Date of Birth: 03/16/1935

## 2017-12-27 ENCOUNTER — Encounter: Payer: Self-pay | Admitting: Physical Therapy

## 2017-12-27 ENCOUNTER — Ambulatory Visit: Payer: Medicare Other | Admitting: Physical Therapy

## 2017-12-27 DIAGNOSIS — R2689 Other abnormalities of gait and mobility: Secondary | ICD-10-CM

## 2017-12-27 DIAGNOSIS — M6281 Muscle weakness (generalized): Secondary | ICD-10-CM | POA: Diagnosis not present

## 2017-12-27 DIAGNOSIS — G8929 Other chronic pain: Secondary | ICD-10-CM | POA: Diagnosis not present

## 2017-12-27 DIAGNOSIS — R293 Abnormal posture: Secondary | ICD-10-CM

## 2017-12-27 DIAGNOSIS — R2681 Unsteadiness on feet: Secondary | ICD-10-CM | POA: Diagnosis not present

## 2017-12-27 DIAGNOSIS — I69354 Hemiplegia and hemiparesis following cerebral infarction affecting left non-dominant side: Secondary | ICD-10-CM | POA: Diagnosis not present

## 2017-12-27 DIAGNOSIS — M25512 Pain in left shoulder: Secondary | ICD-10-CM | POA: Diagnosis not present

## 2017-12-27 NOTE — Therapy (Signed)
[facility] [address] [place], [place], [address] Phone: [phone]   Fax:  [phone]  Physical Therapy Treatment  Patient Details  Name: Fred [name] MRN: [id number] Date of Birth: June 23, 1935 Referring Provider (PT): Dr. Letta Pate   Encounter Date: 12/27/2017  PT End of Session - 12/27/17 1436    Visit Number  3    Number of Visits  17    Date for PT Re-Evaluation  02/12/18    Authorization Type  Medicare, UHC, Tricare (no PTAs). Progress note every 10th visit.     PT Start Time  1410    PT Stop Time  1455    PT Time Calculation (min)  45 min    Equipment Utilized During Treatment  Gait belt    Activity Tolerance  Patient tolerated treatment well;Patient limited by fatigue    Behavior During Therapy  WFL for tasks assessed/performed       Past Medical History:  Diagnosis Date  . Atrial fibrillation ([place])    Post operative  . CAD (coronary artery disease)    s/p cabg  . CHF (congestive heart failure) ([place])   . Diverticulosis   . DJD (degenerative joint disease)   . DM (diabetes mellitus) ([place])   . History of anemia   . History of stroke   . Hyperlipidemia   . Hypertension   . 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 Peripheral vascular disease ([place])   . Polyneuropathy, diabetic ([place])   . Seizure disorder, complex partial ([place])   . Syncope and collapse     Past Surgical History:  Procedure Laterality Date  . CATARACT EXTRACTION W/ INTRAOCULAR LENS  IMPLANT, BILATERAL  06/2013   Dr. [name]  . CORONARY ARTERY BYPASS GRAFT  02/13/2004   4 vesel  . removal of sebaceous cyst from neck  08/2007   Dr. [name]  . TONSILLECTOMY      There were no vitals filed for this visit.  Subjective Assessment - 12/27/17 1413    Subjective  Pt stated no fall; has AFO today; he's done a little bit of ex's at home;    Patient is accompained by:  Family member    Pertinent History  DM  with polyneuropathy, hx of cataracts, hx of CVA in 03/2017 and again 07/2017, OA in R knee, DJD, HTN, HLD, coronary atherosclerosis, a-fib, CHF, PVD, parotid lesion, diverticulitis, partial epilepsy, anemia    Patient Stated Goals  get stronger; able to walk and transfer better    Currently in Pain?  No/denies                       [facility] Adult PT Treatment/Exercise - 12/27/17 0001      Transfers   Transfers  Sit to Stand    Sit to Stand  4: Min guard    Stand to Sit  4: Min guard    Number of Reps  10 reps    Transfer Cueing  proper weight shift and descent control       Ambulation/Gait   Ambulation/Gait  Yes    Ambulation/Gait Assistance  3: Mod assist    Ambulation Distance (Feet)  20 Feet    Assistive device  Parallel bars    Gait Pattern  Step-to pattern      Exercises   Exercises  Knee/Hip      Knee/Hip Exercises: Aerobic   Nustep  sci fit 10 min level 1  Gait training with hemi-walker  Made 1/2 way around gym;  57.5 feet;  Step to pattern; gait belt and left hand held assist ; (grandson followed with w/c)      PT Short Term Goals - 12/14/17 1228      PT SHORT TERM GOAL #1   Title  Pt will be IND in HEP to improve the deficits listed above. TARGET DATE FOR ALL STGS: 01/11/18    Status  New      PT SHORT TERM GOAL #2   Title  Pt will be able to standing without UE support and feet apart for 2 minutes, with S, to perform ADLs safely.     Status  New      PT SHORT TERM GOAL #3   Title  Perform TUG, gait speed, and BERG-write STGs/LTGs, once tolerated.     Status  New      PT SHORT TERM GOAL #4   Title  Pt will amb. 100' with LRAD, over even terrain, at MOD I level to improve functional mobility.     Status  New        PT Long Term Goals - 12/14/17 1229      PT LONG TERM GOAL #1   Title  Pt will verbalize understanding of CVA risk factors and s/s of CVA to reduce risk of additional CVA. TARGET DATE FOR ALL LTGS: 02/08/18    Status   New      PT LONG TERM GOAL #2   Title  Pt will amb. 200' outdoors, with LRAD, with S, in order to walk in his yard safely.     Status  New      PT LONG TERM GOAL #3   Title  Trial manual w/c outdoors and write goal as indicated.     Status  New            Plan - 12/27/17 1459    Clinical Impression Statement  Pt came in AFO for the left foot; much better control;  cont to engage the left UE with transfers and he was able to do the sci-fit and keep a steady grip throughout the whole 10 min w/o assist;  he was able to walk much better after the sci-fit; helped him loosen up and get some reciiprocal mvt prior to gait;      Rehab Potential  Good    Clinical Impairments Affecting Rehab Potential  left side weakness; poor left side motor control    PT Frequency  2x / week    PT Duration  8 weeks    PT Treatment/Interventions  ADLs/Self Care Home Management;Biofeedback;Canalith Repostioning;Electrical Stimulation;Therapeutic activities;Therapeutic exercise;Manual techniques;Vestibular;Wheelchair mobility training;Functional mobility training;Orthotic Fit/Training;Stair training;Gait training;Patient/family education;DME Instruction;Neuromuscular re-education;Balance training    PT Next Visit Plan  cont to work on transfers ;make sure he is getting weight shift correct;  scifit was good tool for recipriocal mvt and UE involvement; he wants to try to get all the way around the gym with hemiwalker; made 1/2 way today    Consulted and Agree with Plan of Care  Patient;Family member/caregiver    Family Member Consulted  wife       Patient will benefit from skilled therapeutic intervention in order to improve the following deficits and impairments:  Abnormal gait, Decreased endurance, Decreased knowledge of use of DME, Decreased strength, Impaired UE functional use, Decreased balance, Decreased mobility, Decreased range of motion, Decreased coordination, Impaired flexibility, Postural  dysfunction  Visit Diagnosis: Hemiplegia and  hemiparesis following cerebral infarction affecting left non-dominant side (HCC)  Unsteadiness on feet  Spastic hemiplegia of left nondominant side as late effect of cerebral infarction (HCC)  Muscle weakness (generalized)  Abnormal posture  Other abnormalities of gait and mobility     Problem List Patient Active Problem List   Diagnosis Date Noted  . 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 Acute blood loss anemia   . Chronic diastolic (congestive) heart failure ([place])   . Acute idiopathic gout of right knee   . Primary osteoarthritis of right knee   . Hemiparesis affecting left side as late effect of cerebrovascular accident (CVA) ([place])   . Diabetes mellitus type 2 in obese ([place])   . Small vessel disease ([place]) 08/11/2017  . Left hemiparesis ([place])   . Benign essential hypertension with delivery   . CVA (cerebral vascular accident) ([place]) 08/10/2017  . 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 of localized onset, not intractable, without status epilepticus ([place]) 06/20/2017  . Primary osteoarthritis of both knees 05/11/2017  . Stroke (cerebrum) ([place]) 03/21/2017  . Type 2 diabetes mellitus with complication, with long-term current use of insulin ([place]) 03/27/2015  . Atherosclerosis of native coronary artery of native heart without angina pectoris 03/27/2015  . Palpitations 11/04/2014  . S/P CABG (coronary artery bypass graft) 11/04/2014  . Atrial fibrillation, unspecified   . History of stroke 09/04/2014  . Obesity (BMI 30-39.9) 11/29/2013  . History of gout 05/16/2013  . Congestive heart failure ([place]) 04/20/2009  . DYSPNEA 02/11/2009  . CARDIOVASCULAR FUNCTION STUDY, ABNORMAL 01/29/2009  . NECK PAIN 08/03/2007  . ANEMIA / OTHER 05/16/2007  . Coronary atherosclerosis 05/16/2007  . PAROXYSMAL ATRIAL FIBRILLATION 05/16/2007  . Subcortical infarction ([place])  05/16/2007  . Hyperlipidemia 03/30/2007  . DEGENERATIVE JOINT DISEASE 03/30/2007  . PAROTID LESION, UNSPECIFIED 09/29/2006  . Partial epilepsy with impairment of consciousness ([place]) 09/29/2006  . Diabetic polyneuropathy ([place]) 09/29/2006  . Essential hypertension 09/29/2006  . PERIPHERAL VASCULAR DISEASE 09/29/2006  . DIVERTICULOSIS 09/29/2006    Rosaura Carpenter D PT DPT  12/27/2017, 3:06 PM  [facility] [address] [place], [place], [address] Phone: [phone]   Fax:  [phone]  Name: Fred [name] MRN: [id number] Date of Birth: April 01, 1935

## 2017-12-28 ENCOUNTER — Encounter: Payer: Self-pay | Admitting: Occupational Therapy

## 2017-12-28 ENCOUNTER — Ambulatory Visit: Payer: Medicare Other | Admitting: Occupational Therapy

## 2017-12-28 DIAGNOSIS — M25612 Stiffness of left shoulder, not elsewhere classified: Secondary | ICD-10-CM

## 2017-12-28 DIAGNOSIS — R293 Abnormal posture: Secondary | ICD-10-CM | POA: Diagnosis not present

## 2017-12-28 DIAGNOSIS — I69354 Hemiplegia and hemiparesis following cerebral infarction affecting left non-dominant side: Secondary | ICD-10-CM

## 2017-12-28 DIAGNOSIS — M25532 Pain in left wrist: Secondary | ICD-10-CM

## 2017-12-28 DIAGNOSIS — G8929 Other chronic pain: Secondary | ICD-10-CM

## 2017-12-28 DIAGNOSIS — M25512 Pain in left shoulder: Secondary | ICD-10-CM

## 2017-12-28 DIAGNOSIS — M6281 Muscle weakness (generalized): Secondary | ICD-10-CM

## 2017-12-28 DIAGNOSIS — R2681 Unsteadiness on feet: Secondary | ICD-10-CM | POA: Diagnosis not present

## 2017-12-28 DIAGNOSIS — M25632 Stiffness of left wrist, not elsewhere classified: Secondary | ICD-10-CM

## 2017-12-28 NOTE — Therapy (Signed)
[facility] [address] [place], [place], [address] Phone: [phone]   Fax:  [phone]  Occupational Therapy Treatment  Patient Details  Name: Fred [name] MRN: [id number] Date of Birth: [birth year] Referring Provider (OT): Dr. Alysia Penna   Encounter Date: 12/28/2017  OT End of Session - 12/28/17 1659    Visit Number  4    Number of Visits  24    Date for OT Re-Evaluation  03/08/18    Authorization Type  Medicare, Tricare, UHC - pt will need PN every 10th visit    Authorization Time Period  90 days    Authorization - Visit Number  4    Authorization - Number of Visits  10    OT Start Time  1531    OT Stop Time  1615    OT Time Calculation (min)  44 min    Activity Tolerance  Patient tolerated treatment well       Past Medical History:  Diagnosis Date  . Atrial fibrillation ([place])    Post operative  . CAD (coronary artery disease)    s/p cabg  . CHF (congestive heart failure) ([place])   . Diverticulosis   . DJD (degenerative joint disease)   . DM (diabetes mellitus) ([place])   . History of anemia   . History of stroke   . Hyperlipidemia   . Hypertension   . 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 Peripheral vascular disease ([place])   . Polyneuropathy, diabetic ([place])   . Seizure disorder, complex partial ([place])   . Syncope and collapse     Past Surgical History:  Procedure Laterality Date  . CATARACT EXTRACTION W/ INTRAOCULAR LENS  IMPLANT, BILATERAL  06/2013   Dr. [name]  . CORONARY ARTERY BYPASS GRAFT  02/13/2004   4 vesel  . removal of sebaceous cyst from neck  08/2007   Dr. [name]  . TONSILLECTOMY      There were no vitals filed for this visit.  Subjective Assessment - 12/28/17 1534    Subjective   Is that my leg working?    Patient is accompained by:  Family member   wife and grandson   Pertinent History  [name] 08/09/2017. DIscharged home on 09/22/2017 after inpt  rehab stay. Pt had HH PT and OT. PMH:      Patient Stated Goals  I want to get walking again.  I am an outdoor guy    Currently in Pain?  No/denies                   OT Treatments/Exercises (OP) - 12/28/17 0001      Neurological Re-education Exercises   Other Exercises 1  Neuro re ed to address trunk control in sitting with incorporation of RUE in weigth bearing - pt reports that he is scheduled to have botox in 2 weeks.  Progressed to forward weight shift in sitting with active extension of trunk moving into sit to stand without UE use.  Addressed static standing to find midline as well as lateral weight shifting in standing. Progressed to stepping with RLE while stabiizing with LLE initially with mod facilitation however with repetition pt able to complete with just vc's and min facilitation to stabilize LLE. Introduced walker to incorporate LUE into weight bearing and activate proximal shoulder postural control - pt able to weight bear through both UE's with walker (using hand orthosis) for stepping forward and backward (mod a  for RLE).  Also addressed stand to sit without UE support and in midline as well as transfers to both sides.  Min a for squat pivot transfers.                OT Short Term Goals - 12/18/17 1530      OT SHORT TERM GOAL #1   Title  Pt and family will be mod I with basic home exercise/home activities program for LUE, balance and functional mobility - 01/25/2018    Status  On-going      OT SHORT TERM GOAL #2   Title  Pt will be supervision with UB bathing    Status  On-going      OT SHORT TERM GOAL #3   Title  Pt will be min a for LB bathing    Status  On-going      OT SHORT TERM GOAL #4   Title  Pt will be close supervision for toilet transfers    Status  On-going      OT SHORT TERM GOAL #5   Title  Pt will be min a for tub bench transfers (assist with LLE)    Status  On-going      OT SHORT TERM GOAL #6   Title  Pt will tolerate shoulder  flexion to 110* with no pain in supine in prep for HEP and low functional reach    Status  On-going      OT SHORT TERM GOAL #7   Title  Pt will demonstrate shoulder flexion to at least 30* in prep for low reach    Status  On-going      OT SHORT TERM GOAL #8   Title  Pt will demonstrate ability to hold light objects in L hand while manipulating with R hand (i.e opening containers) with min a    Status  On-going      OT SHORT TERM GOAL  #9   TITLE  Pt will be mod a for LB dressing    Status  On-going      OT SHORT TERM GOAL  #10   TITLE  Pt/family will be mod I with splint wear and care, prn    Status  On-going        OT Long Term Goals - 12/18/17 1633      OT LONG TERM GOAL #1   Title  Pt and family will be mod I with upgraded HEP/home activities program - 03/08/2018    Status  New      OT LONG TERM GOAL #2   Title  Pt will be min a for LB dressing    Status  New      OT LONG TERM GOAL #3   Title  Pt will be mod I with toilet transfers    Status  New      OT LONG TERM GOAL #4   Title  Pt will be mod I with clothing manipulation with toileting activities    Status  New      OT LONG TERM GOAL #5   Title  Pt will be min a for simple familiar hot meal prep    Status  New      OT LONG TERM GOAL #6   Title  Pt will demonstate ability to use LUE as stabilzer at least 25% of the time during basic self care and fishing activities.    Status  New      OT LONG TERM  GOAL #7   Title  Pt will be mod I with casting simple fishing pole    Status  New      OT LONG TERM GOAL #8   Title  Pt will demonstate adequate postural alignment and control to complete simple activities in standing without UE support.     Status  New            Plan - 12/28/17 1657    Clinical Impression Statement  Pt progressing toward goals.  Pt with improving sense of midline as well as beginning improvement in postural alignment and control.     Occupational Profile and client history currently  impacting functional performance  PMH: husband, father, grand father, retiree.  Pt loves the outdoors and fishing.  PMH: CAD, S/P CABG, DM, HTN, HLD, AFIB, CHF, seizure, PVD, obesity, ETHO abuse, TIA in 03/2017.      Occupational performance deficits (Please refer to evaluation for details):  ADL's;IADL's;Leisure;Social Participation;Rest and Sleep    Rehab Potential  Good    Current Impairments/barriers affecting progress:  medical history, severity of motor deficits    OT Frequency  2x / week    OT Duration  12 weeks    OT Treatment/Interventions  Self-care/ADL training;Aquatic Therapy;Electrical Stimulation;Moist Heat;Fluidtherapy;Ultrasound;Therapeutic exercise;Neuromuscular education;Energy conservation;DME and/or AE instruction;Passive range of motion;Manual Therapy;Functional Mobility Training;Splinting;Therapeutic activities;Patient/family education;Balance training    Plan  manual therapy to address LUE shouler girdle alignment, NMR for LUE, balance, functional mobility, ADL's,     Consulted and Agree with Plan of Care  Patient    Family Member Consulted  wife       Patient will benefit from skilled therapeutic intervention in order to improve the following deficits and impairments:  Abnormal gait, Decreased balance, Decreased range of motion, Decreased mobility, Decreased knowledge of use of DME, Decreased strength, Difficulty walking, Impaired UE functional use, Impaired tone, Impaired flexibility, Pain  Visit Diagnosis: Unsteadiness on feet  Spastic hemiplegia of left nondominant side as late effect of cerebral infarction (HCC)  Muscle weakness (generalized)  Abnormal posture  Chronic left shoulder pain  Pain in left wrist  Stiffness of left shoulder, not elsewhere classified  Stiffness of left wrist, not elsewhere classified    Problem List Patient Active Problem List   Diagnosis Date Noted  . 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 Acute blood loss anemia   . Chronic diastolic (congestive) heart failure ([place])   . Acute idiopathic gout of right knee   . Primary osteoarthritis of right knee   . Hemiparesis affecting left side as late effect of cerebrovascular accident (CVA) ([place])   . Diabetes mellitus type 2 in obese ([place])   . Small vessel disease ([place]) 08/11/2017  . Left hemiparesis ([place])   . Benign essential hypertension with delivery   . CVA (cerebral vascular accident) ([place]) 08/10/2017  . 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 of localized onset, not intractable, without status epilepticus ([place]) 06/20/2017  . Primary osteoarthritis of both knees 05/11/2017  . Stroke (cerebrum) ([place]) 03/21/2017  . Type 2 diabetes mellitus with complication, with long-term current use of insulin ([place]) 03/27/2015  . Atherosclerosis of native coronary artery of native heart without angina pectoris 03/27/2015  . Palpitations 11/04/2014  . S/P CABG (coronary artery bypass graft) 11/04/2014  . Atrial fibrillation, unspecified   . History of stroke 09/04/2014  . Obesity (BMI 30-39.9) 11/29/2013  . History of gout 05/16/2013  . Congestive heart failure ([place]) 04/20/2009  .  DYSPNEA 02/11/2009  . CARDIOVASCULAR FUNCTION STUDY, ABNORMAL 01/29/2009  . NECK PAIN 08/03/2007  . ANEMIA / OTHER 05/16/2007  . Coronary atherosclerosis 05/16/2007  . PAROXYSMAL ATRIAL FIBRILLATION 05/16/2007  . Subcortical infarction ([place]) 05/16/2007  . Hyperlipidemia 03/30/2007  . DEGENERATIVE JOINT DISEASE 03/30/2007  . PAROTID LESION, UNSPECIFIED 09/29/2006  . Partial epilepsy with impairment of consciousness ([place]) 09/29/2006  . Diabetic polyneuropathy ([place]) 09/29/2006  . Essential hypertension 09/29/2006  . PERIPHERAL VASCULAR DISEASE 09/29/2006  . DIVERTICULOSIS 09/29/2006    Quay Burow, OTR/L 12/28/2017, 5:00 PM  [facility] [address] [place], [place], [address] Phone: [phone]   Fax:  [phone]  Name: OWYNN MOSQUEDA MRN: [id number] Date of Birth: August 17, 1935

## 2018-01-02 ENCOUNTER — Ambulatory Visit: Payer: Medicare Other | Admitting: Occupational Therapy

## 2018-01-02 ENCOUNTER — Ambulatory Visit: Payer: Medicare Other

## 2018-01-02 ENCOUNTER — Encounter: Payer: Self-pay | Admitting: Occupational Therapy

## 2018-01-02 DIAGNOSIS — M25612 Stiffness of left shoulder, not elsewhere classified: Secondary | ICD-10-CM

## 2018-01-02 DIAGNOSIS — M25512 Pain in left shoulder: Secondary | ICD-10-CM

## 2018-01-02 DIAGNOSIS — G8929 Other chronic pain: Secondary | ICD-10-CM

## 2018-01-02 DIAGNOSIS — M6281 Muscle weakness (generalized): Secondary | ICD-10-CM

## 2018-01-02 DIAGNOSIS — I69354 Hemiplegia and hemiparesis following cerebral infarction affecting left non-dominant side: Secondary | ICD-10-CM

## 2018-01-02 DIAGNOSIS — R2681 Unsteadiness on feet: Secondary | ICD-10-CM | POA: Diagnosis not present

## 2018-01-02 DIAGNOSIS — M25632 Stiffness of left wrist, not elsewhere classified: Secondary | ICD-10-CM

## 2018-01-02 DIAGNOSIS — M25532 Pain in left wrist: Secondary | ICD-10-CM

## 2018-01-02 DIAGNOSIS — R2689 Other abnormalities of gait and mobility: Secondary | ICD-10-CM

## 2018-01-02 DIAGNOSIS — R293 Abnormal posture: Secondary | ICD-10-CM

## 2018-01-02 NOTE — Therapy (Signed)
[facility] [address] [place], [place], [address] Phone: [phone]   Fax:  [phone]  Physical Therapy Treatment  Patient Details  Name: Fred [name] MRN: [id number] Date of Birth: 20-Nov-1935 Referring Provider (PT): Dr. Letta Pate   Encounter Date: 01/02/2018  PT End of Session - 01/02/18 0934    Visit Number  4    Number of Visits  17    Date for PT Re-Evaluation  02/12/18    Authorization Type  Medicare, UHC, Tricare (no PTAs). Progress note every 10th visit.     PT Start Time  [phone]    PT Stop Time  0930    PT Time Calculation (min)  40 min    Equipment Utilized During Treatment  --   min A to min guard    Activity Tolerance  Patient tolerated treatment well;Patient limited by fatigue    Behavior During Therapy  WFL for tasks assessed/performed       Past Medical History:  Diagnosis Date  . Atrial fibrillation ([place])    Post operative  . CAD (coronary artery disease)    s/p cabg  . CHF (congestive heart failure) ([place])   . Diverticulosis   . DJD (degenerative joint disease)   . DM (diabetes mellitus) ([place])   . History of anemia   . History of stroke   . Hyperlipidemia   . Hypertension   . 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 Peripheral vascular disease ([place])   . Polyneuropathy, diabetic ([place])   . Seizure disorder, complex partial ([place])   . Syncope and collapse     Past Surgical History:  Procedure Laterality Date  . CATARACT EXTRACTION W/ INTRAOCULAR LENS  IMPLANT, BILATERAL  06/2013   Dr. [name]  . CORONARY ARTERY BYPASS GRAFT  02/13/2004   4 vesel  . removal of sebaceous cyst from neck  08/2007   Dr. [name]  . TONSILLECTOMY      There were no vitals filed for this visit.  Subjective Assessment - 01/02/18 0853    Subjective  Pt denied falls or changes since last visit. Pt has L AFO donned upon entering gym.     Patient is accompained by:  Family member    [place]: SIL for part of the session   Pertinent History  DM with polyneuropathy, hx of cataracts, hx of CVA in 03/2017 and again 07/2017, OA in R knee, DJD, HTN, HLD, coronary atherosclerosis, a-fib, CHF, PVD, parotid lesion, diverticulitis, partial epilepsy, anemia    Patient Stated Goals  get stronger; able to walk and transfer better    Currently in Pain?  No/denies          Therex and Neuro re-ed: Access Code: [email or web address]  URL: [email or web address]  Date: 01/02/2018  Prepared by: Geoffry Paradise   Exercises  Standing Weight Shift Side to Side - 10 reps - 1 sets - 1-2 hold - 1x daily - 7x weekly  Sit to Stand with Counter Support - 10 reps - 1 sets - 1x daily - 7x weekly  Seated Knee Extension AROM - 5 reps - 2 sets - 1x daily - 5x weekly (therex) Seated Heel Slide - 5 reps - 2 sets - 1x daily - 5x weekly  (therex) Seated Hamstring Stretch with Chair - 3 reps - 1 sets - 30-45 hold - 1x daily - 7x weekly (therex)  PT spent extensive time providing demo and verbal/tacile cues for proper STS txf technique and  weight shifting. Pt progressed from min A to min guard during STS txfs and was able to self correct L knee flexion in stance towards end of session. Cues to scoot towards edge of chair, improve anterior weight shifting and equal weight through BLE. Cues and demo demo for all other balance and therex activities required. Lynnae Sandhoff( son-in-law) present to ensure proper technique at home.                        PT Education - 01/02/18 0934    Education Details  PT provided pt with HEP.    Person(s) Educated  Patient;Child(ren)   son-in-law (SIL): Lynnae Sandhoff   Methods  Explanation;Demonstration;Tactile cues;Verbal cues;Handout    Comprehension  Verbalized understanding;Returned demonstration;Need further instruction       PT Short Term Goals - 12/14/17 1228      PT SHORT TERM GOAL #1   Title  Pt will be IND in HEP to improve the deficits listed above.  TARGET DATE FOR ALL STGS: 01/11/18    Status  New      PT SHORT TERM GOAL #2   Title  Pt will be able to standing without UE support and feet apart for 2 minutes, with S, to perform ADLs safely.     Status  New      PT SHORT TERM GOAL #3   Title  Perform TUG, gait speed, and BERG-write STGs/LTGs, once tolerated.     Status  New      PT SHORT TERM GOAL #4   Title  Pt will amb. 100' with LRAD, over even terrain, at MOD I level to improve functional mobility.     Status  New        PT Long Term Goals - 12/14/17 1229      PT LONG TERM GOAL #1   Title  Pt will verbalize understanding of CVA risk factors and s/s of CVA to reduce risk of additional CVA. TARGET DATE FOR ALL LTGS: 02/08/18    Status  New      PT LONG TERM GOAL #2   Title  Pt will amb. 200' outdoors, with LRAD, with S, in order to walk in his yard safely.     Status  New      PT LONG TERM GOAL #3   Title  Trial manual w/c outdoors and write goal as indicated.     Status  New            Plan - 01/02/18 0935    Clinical Impression Statement  Pt demonstrated progress towards end of session, as he was able to progress from min A during STS txfs to min guard for safety. Pt required frequent seated rest breaks 2/2 fatigue. PT established HEP to improve strength and balance today. Continue with POC.     Rehab Potential  Good    Clinical Impairments Affecting Rehab Potential  left side weakness; poor left side motor control    PT Frequency  2x / week    PT Duration  8 weeks    PT Treatment/Interventions  ADLs/Self Care Home Management;Biofeedback;Canalith Repostioning;Electrical Stimulation;Therapeutic activities;Therapeutic exercise;Manual techniques;Vestibular;Wheelchair mobility training;Functional mobility training;Orthotic Fit/Training;Stair training;Gait training;Patient/family education;DME Instruction;Neuromuscular re-education;Balance training    PT Next Visit Plan  cont to work on transfers ;make sure he is  getting weight shift correct;  scifit was good tool for recipriocal mvt and UE involvement; he wants to try to get all the way around the gym  with hemiwalker (RW with h/o?)    Consulted and Agree with Plan of Care  Patient;Family member/caregiver    Family Member Consulted  wife       Patient will benefit from skilled therapeutic intervention in order to improve the following deficits and impairments:  Abnormal gait, Decreased endurance, Decreased knowledge of use of DME, Decreased strength, Impaired UE functional use, Decreased balance, Decreased mobility, Decreased range of motion, Decreased coordination, Impaired flexibility, Postural dysfunction  Visit Diagnosis: Hemiplegia and hemiparesis following cerebral infarction affecting left non-dominant side (HCC)  Other abnormalities of gait and mobility  Unsteadiness on feet     Problem List Patient Active Problem List   Diagnosis Date Noted  . 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 Acute blood loss anemia   . Chronic diastolic (congestive) heart failure ([place])   . Acute idiopathic gout of right knee   . Primary osteoarthritis of right knee   . Hemiparesis affecting left side as late effect of cerebrovascular accident (CVA) ([place])   . Diabetes mellitus type 2 in obese ([place])   . Small vessel disease ([place]) 08/11/2017  . Left hemiparesis ([place])   . Benign essential hypertension with delivery   . CVA (cerebral vascular accident) ([place]) 08/10/2017  . 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 of localized onset, not intractable, without status epilepticus ([place]) 06/20/2017  . Primary osteoarthritis of both knees 05/11/2017  . Stroke (cerebrum) ([place]) 03/21/2017  . Type 2 diabetes mellitus with complication, with long-term current use of insulin ([place]) 03/27/2015  . Atherosclerosis of native coronary artery of native heart without angina pectoris 03/27/2015  .  Palpitations 11/04/2014  . S/P CABG (coronary artery bypass graft) 11/04/2014  . Atrial fibrillation, unspecified   . History of stroke 09/04/2014  . Obesity (BMI 30-39.9) 11/29/2013  . History of gout 05/16/2013  . Congestive heart failure ([place]) 04/20/2009  . DYSPNEA 02/11/2009  . CARDIOVASCULAR FUNCTION STUDY, ABNORMAL 01/29/2009  . NECK PAIN 08/03/2007  . ANEMIA / OTHER 05/16/2007  . Coronary atherosclerosis 05/16/2007  . PAROXYSMAL ATRIAL FIBRILLATION 05/16/2007  . Subcortical infarction ([place]) 05/16/2007  . Hyperlipidemia 03/30/2007  . DEGENERATIVE JOINT DISEASE 03/30/2007  . PAROTID LESION, UNSPECIFIED 09/29/2006  . Partial epilepsy with impairment of consciousness ([place]) 09/29/2006  . Diabetic polyneuropathy ([place]) 09/29/2006  . Essential hypertension 09/29/2006  . PERIPHERAL VASCULAR DISEASE 09/29/2006  . DIVERTICULOSIS 09/29/2006    Latoyna Hird L 01/02/2018, 9:37 AM  [facility] [address] [place], [place], [address] Phone: [phone]   Fax:  [phone]  Name: ZACKARI RUANE MRN: [id number] Date of Birth: [birth year]  Geoffry Paradise, PT,DPT 01/02/18 9:39 AM Phone: [phone] Fax: [phone]

## 2018-01-02 NOTE — Therapy (Signed)
[facility] [address] [place], [place], [address] Phone: [phone]   Fax:  [phone]  Occupational Therapy Treatment  Patient Details  Name: Fred [name] MRN: [id number] Date of Birth: [birth year] Referring Provider (OT): Dr. Alysia Penna   Encounter Date: 01/02/2018  OT End of Session - 01/02/18 1222    Visit Number  5    Number of Visits  24    Date for OT Re-Evaluation  03/08/18    Authorization Type  Medicare, Tricare, UHC - pt will need PN every 10th visit    Authorization Time Period  90 days    Authorization - Visit Number  5    Authorization - Number of Visits  10    OT Start Time  0932    OT Stop Time  1015    OT Time Calculation (min)  43 min    Activity Tolerance  Patient tolerated treatment well       Past Medical History:  Diagnosis Date  . Atrial fibrillation ([place])    Post operative  . CAD (coronary artery disease)    s/p cabg  . CHF (congestive heart failure) ([place])   . Diverticulosis   . DJD (degenerative joint disease)   . DM (diabetes mellitus) ([place])   . History of anemia   . History of stroke   . Hyperlipidemia   . Hypertension   . 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 Peripheral vascular disease ([place])   . Polyneuropathy, diabetic ([place])   . Seizure disorder, complex partial ([place])   . Syncope and collapse     Past Surgical History:  Procedure Laterality Date  . CATARACT EXTRACTION W/ INTRAOCULAR LENS  IMPLANT, BILATERAL  06/2013   Dr. [name]  . CORONARY ARTERY BYPASS GRAFT  02/13/2004   4 vesel  . removal of sebaceous cyst from neck  08/2007   Dr. [name]  . TONSILLECTOMY      There were no vitals filed for this visit.  Subjective Assessment - 01/02/18 0936    Subjective   My legs are weaker than they used to be (before the stroke)    Patient is accompained by:  Family member   son   Pertinent History  [name] 08/09/2017. DIscharged home  on 09/22/2017 after inpt rehab stay. Pt had HH PT and OT. PMH:      Patient Stated Goals  I want to get walking again.  I am an outdoor guy    Currently in Pain?  No/denies                   OT Treatments/Exercises (OP) - 01/02/18 0001      ADLs   LB Dressing  Addressed LB dressing with pt - pt able to don and doff pants with min a to stablize standing balance and approximate midline in standing.  Pt able to doff shoes as well.  Pt curently dressing in bed.  Discussed with pt and son the importance of havng the pt dress in an upright seated (sit to stand) position in order to improve independence, address sitting balance, trunk control, sit to stand, standing balance and stand to sit.  Requested that wife attend next session in order to train wife in safe strategies to allow pt to be more independent.  Pt with improved sit to stand and standing balance.  Pt "pushes" onto L side however is able to find midline when not over assisted.  Functional Mobility  Addressed sit to stand, stand to sit and squat pivot transfers (min a and vc's).  Also addressed bed mobility (sitting to supine, rolling to R, and supine to sitting) - after instruction and practice pt able to complete with intermittent min a only.  Pt and son to practice at home.        Neurological Re-education Exercises   Other Exercises 1  Neuro re ed to address active trunk rotation and thoracic extension in supine initially and then in sitting.  Also addressed forward bending in sitting to work toward pt being able to reach his feet for ADL actvities.               OT Education - 01/02/18 1221    Education Details  bed mobility, began LB dressing education    Person(s) Educated  Patient;Child(ren)    Methods  Explanation;Demonstration    Comprehension  Verbalized understanding;Need further instruction   wife to attend next session for training      OT Short Term Goals - 12/18/17 1530      OT SHORT TERM GOAL #1    Title  Pt and family will be mod I with basic home exercise/home activities program for LUE, balance and functional mobility - 01/25/2018    Status  On-going      OT SHORT TERM GOAL #2   Title  Pt will be supervision with UB bathing    Status  On-going      OT SHORT TERM GOAL #3   Title  Pt will be min a for LB bathing    Status  On-going      OT SHORT TERM GOAL #4   Title  Pt will be close supervision for toilet transfers    Status  On-going      OT SHORT TERM GOAL #5   Title  Pt will be min a for tub bench transfers (assist with LLE)    Status  On-going      OT SHORT TERM GOAL #6   Title  Pt will tolerate shoulder flexion to 110* with no pain in supine in prep for HEP and low functional reach    Status  On-going      OT SHORT TERM GOAL #7   Title  Pt will demonstrate shoulder flexion to at least 30* in prep for low reach    Status  On-going      OT SHORT TERM GOAL #8   Title  Pt will demonstrate ability to hold light objects in L hand while manipulating with R hand (i.e opening containers) with min a    Status  On-going      OT SHORT TERM GOAL  #9   TITLE  Pt will be mod a for LB dressing    Status  On-going      OT SHORT TERM GOAL  #10   TITLE  Pt/family will be mod I with splint wear and care, prn    Status  On-going        OT Long Term Goals - 12/18/17 1633      OT LONG TERM GOAL #1   Title  Pt and family will be mod I with upgraded HEP/home activities program - 03/08/2018    Status  New      OT LONG TERM GOAL #2   Title  Pt will be min a for LB dressing    Status  New      OT LONG  TERM GOAL #3   Title  Pt will be mod I with toilet transfers    Status  New      OT LONG TERM GOAL #4   Title  Pt will be mod I with clothing manipulation with toileting activities    Status  New      OT LONG TERM GOAL #5   Title  Pt will be min a for simple familiar hot meal prep    Status  New      OT LONG TERM GOAL #6   Title  Pt will demonstate ability to use LUE  as stabilzer at least 25% of the time during basic self care and fishing activities.    Status  New      OT LONG TERM GOAL #7   Title  Pt will be mod I with casting simple fishing pole    Status  New      OT LONG TERM GOAL #8   Title  Pt will demonstate adequate postural alignment and control to complete simple activities in standing without UE support.     Status  New            Plan - 01/02/18 1222    Clinical Impression Statement  Pt progressing toward goals. Pt with improving functional mobility and basic ADL's    Occupational Profile and client history currently impacting functional performance  PMH: husband, father, grand father, retiree.  Pt loves the outdoors and fishing.  PMH: CAD, S/P CABG, DM, HTN, HLD, AFIB, CHF, seizure, PVD, obesity, ETHO abuse, TIA in 03/2017.      Occupational performance deficits (Please refer to evaluation for details):  ADL's;IADL's;Leisure;Social Participation;Rest and Sleep    Rehab Potential  Good    Current Impairments/barriers affecting progress:  medical history, severity of motor deficits    OT Frequency  2x / week    OT Duration  12 weeks    OT Treatment/Interventions  Self-care/ADL training;Aquatic Therapy;Electrical Stimulation;Moist Heat;Fluidtherapy;Ultrasound;Therapeutic exercise;Neuromuscular education;Energy conservation;DME and/or AE instruction;Passive range of motion;Manual Therapy;Functional Mobility Training;Splinting;Therapeutic activities;Patient/family education;Balance training    Plan  manual therapy to address LUE shouler girdle alignment, NMR for LUE, balance, functional mobility, ADL's,     Consulted and Agree with Plan of Care  Patient    Family Member Consulted  son       Patient will benefit from skilled therapeutic intervention in order to improve the following deficits and impairments:  Abnormal gait, Decreased balance, Decreased range of motion, Decreased mobility, Decreased knowledge of use of DME, Decreased  strength, Difficulty walking, Impaired UE functional use, Impaired tone, Impaired flexibility, Pain  Visit Diagnosis: Unsteadiness on feet  Spastic hemiplegia of left nondominant side as late effect of cerebral infarction (HCC)  Muscle weakness (generalized)  Abnormal posture  Chronic left shoulder pain  Pain in left wrist  Stiffness of left shoulder, not elsewhere classified  Stiffness of left wrist, not elsewhere classified    Problem List Patient Active Problem List   Diagnosis Date Noted  . 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 Acute blood loss anemia   . Chronic diastolic (congestive) heart failure ([place])   . Acute idiopathic gout of right knee   . Primary osteoarthritis of right knee   . Hemiparesis affecting left side as late effect of cerebrovascular accident (CVA) ([place])   . Diabetes mellitus type 2 in obese ([place])   . Small vessel disease ([place]) 08/11/2017  . Left hemiparesis ([place])   .  Benign essential hypertension with delivery   . CVA (cerebral vascular accident) ([place]) 08/10/2017  . 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 of localized onset, not intractable, without status epilepticus ([place]) 06/20/2017  . Primary osteoarthritis of both knees 05/11/2017  . Stroke (cerebrum) ([place]) 03/21/2017  . Type 2 diabetes mellitus with complication, with long-term current use of insulin ([place]) 03/27/2015  . Atherosclerosis of native coronary artery of native heart without angina pectoris 03/27/2015  . Palpitations 11/04/2014  . S/P CABG (coronary artery bypass graft) 11/04/2014  . Atrial fibrillation, unspecified   . History of stroke 09/04/2014  . Obesity (BMI 30-39.9) 11/29/2013  . History of gout 05/16/2013  . Congestive heart failure ([place]) 04/20/2009  . DYSPNEA 02/11/2009  . CARDIOVASCULAR FUNCTION STUDY, ABNORMAL 01/29/2009  . NECK PAIN 08/03/2007  . ANEMIA / OTHER 05/16/2007  . Coronary  atherosclerosis 05/16/2007  . PAROXYSMAL ATRIAL FIBRILLATION 05/16/2007  . Subcortical infarction ([place]) 05/16/2007  . Hyperlipidemia 03/30/2007  . DEGENERATIVE JOINT DISEASE 03/30/2007  . PAROTID LESION, UNSPECIFIED 09/29/2006  . Partial epilepsy with impairment of consciousness ([place]) 09/29/2006  . Diabetic polyneuropathy ([place]) 09/29/2006  . Essential hypertension 09/29/2006  . PERIPHERAL VASCULAR DISEASE 09/29/2006  . DIVERTICULOSIS 09/29/2006    Quay Burow, OTR/L 01/02/2018, 12:23 PM  [facility] [address] [place], [place], [address] Phone: [phone]   Fax:  [phone]  Name: KENZIE FLAKES MRN: [id number] Date of Birth: [birth year]

## 2018-01-02 NOTE — Patient Instructions (Signed)
Access Code: [email or web address]  URL: [email or web address]  Date: 01/02/2018  Prepared by: Geoffry Paradise   Exercises  Standing Weight Shift Side to Side - 10 reps - 1 sets - 1-2 hold - 1x daily - 7x weekly  Sit to Stand with Counter Support - 10 reps - 1 sets - 1x daily - 7x weekly  Seated Knee Extension AROM - 5 reps - 2 sets - 1x daily - 5x weekly  Seated Heel Slide - 5 reps - 2 sets - 1x daily - 5x weekly  Seated Hamstring Stretch with Chair - 3 reps - 1 sets - 30-45 hold - 1x daily - 7x weekly

## 2018-01-04 ENCOUNTER — Ambulatory Visit: Payer: Medicare Other | Admitting: Occupational Therapy

## 2018-01-04 ENCOUNTER — Encounter: Payer: Self-pay | Admitting: Occupational Therapy

## 2018-01-04 ENCOUNTER — Ambulatory Visit: Payer: Medicare Other

## 2018-01-04 DIAGNOSIS — I69354 Hemiplegia and hemiparesis following cerebral infarction affecting left non-dominant side: Secondary | ICD-10-CM | POA: Diagnosis not present

## 2018-01-04 DIAGNOSIS — R2681 Unsteadiness on feet: Secondary | ICD-10-CM

## 2018-01-04 DIAGNOSIS — M25532 Pain in left wrist: Secondary | ICD-10-CM

## 2018-01-04 DIAGNOSIS — G8929 Other chronic pain: Secondary | ICD-10-CM | POA: Diagnosis not present

## 2018-01-04 DIAGNOSIS — R293 Abnormal posture: Secondary | ICD-10-CM | POA: Diagnosis not present

## 2018-01-04 DIAGNOSIS — R2689 Other abnormalities of gait and mobility: Secondary | ICD-10-CM

## 2018-01-04 DIAGNOSIS — M25512 Pain in left shoulder: Secondary | ICD-10-CM | POA: Diagnosis not present

## 2018-01-04 DIAGNOSIS — M6281 Muscle weakness (generalized): Secondary | ICD-10-CM

## 2018-01-04 DIAGNOSIS — M25632 Stiffness of left wrist, not elsewhere classified: Secondary | ICD-10-CM

## 2018-01-04 DIAGNOSIS — M25612 Stiffness of left shoulder, not elsewhere classified: Secondary | ICD-10-CM

## 2018-01-04 NOTE — Therapy (Signed)
[facility] [address] [place], [place], [address] Phone: [phone]   Fax:  [phone]  Occupational Therapy Treatment  Patient Details  Name: Fred [name] MRN: [id number] Date of Birth: September 13, 1935 Referring Provider (OT): Dr. Alysia Penna   Encounter Date: 01/04/2018  OT End of Session - 01/04/18 0948    Visit Number  6    Number of Visits  24    Date for OT Re-Evaluation  03/08/18    Authorization Type  Medicare, Tricare, UHC - pt will need PN every 10th visit    Authorization Time Period  90 days    Authorization - Visit Number  6    Authorization - Number of Visits  10    OT Start Time  0846    OT Stop Time  0930    OT Time Calculation (min)  44 min    Activity Tolerance  Patient tolerated treatment well       Past Medical History:  Diagnosis Date  . Atrial fibrillation ([place])    Post operative  . CAD (coronary artery disease)    s/p cabg  . CHF (congestive heart failure) ([place])   . Diverticulosis   . DJD (degenerative joint disease)   . DM (diabetes mellitus) ([place])   . History of anemia   . History of stroke   . Hyperlipidemia   . Hypertension   . 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 Peripheral vascular disease ([place])   . Polyneuropathy, diabetic ([place])   . Seizure disorder, complex partial ([place])   . Syncope and collapse     Past Surgical History:  Procedure Laterality Date  . CATARACT EXTRACTION W/ INTRAOCULAR LENS  IMPLANT, BILATERAL  06/2013   Dr. [name]  . CORONARY ARTERY BYPASS GRAFT  02/13/2004   4 vesel  . removal of sebaceous cyst from neck  08/2007   Dr. [name]  . TONSILLECTOMY      There were no vitals filed for this visit.  Subjective Assessment - 01/04/18 0853    Subjective   I like to do this for myself    Patient is accompained by:  Family member   wife and dtr   Pertinent History  [name] 08/09/2017. DIscharged home on 09/22/2017 after inpt  rehab stay. Pt had HH PT and OT. PMH:      Patient Stated Goals  I want to get walking again.  I am an outdoor guy    Currently in Pain?  No/denies                   OT Treatments/Exercises (OP) - 01/04/18 0001      ADLs   LB Dressing  Addressed LB dressing with pt, dttr and wife.  Instructed pt, family and dtr in safe process for LD dressing, sit to stand, standing balance, and stand to sit.  Also educated on "pusher syndrome."  Had wife practice several times - wife will need more practice for carry over. Dtr to work with pt and wife at home to practice several sit to stands and stands to sit.  Will reassess next session.  Strategies given to pt, wife and dtr in writing. Dtr expresed that car trasnfers are difficult - PT made aware.               OT Education - 01/04/18 0946    Education Details  LB dressing, sit to stand, stand to sit, pusher syndrome  Person(s) Educated  Patient;Spouse;Child(ren)    Methods  Explanation;Demonstration;Verbal cues;Handout    Comprehension  Verbalized understanding;Returned demonstration;Need further instruction;Verbal cues required   dtr to work with wife and pt at home to practice - will address again next session      OT Short Term Goals - 12/18/17 1530      OT SHORT TERM GOAL #1   Title  Pt and family will be mod I with basic home exercise/home activities program for LUE, balance and functional mobility - 01/25/2018    Status  On-going      OT SHORT TERM GOAL #2   Title  Pt will be supervision with UB bathing    Status  On-going      OT SHORT TERM GOAL #3   Title  Pt will be min a for LB bathing    Status  On-going      OT SHORT TERM GOAL #4   Title  Pt will be close supervision for toilet transfers    Status  On-going      OT SHORT TERM GOAL #5   Title  Pt will be min a for tub bench transfers (assist with LLE)    Status  On-going      OT SHORT TERM GOAL #6   Title  Pt will tolerate shoulder flexion to 110* with  no pain in supine in prep for HEP and low functional reach    Status  On-going      OT SHORT TERM GOAL #7   Title  Pt will demonstrate shoulder flexion to at least 30* in prep for low reach    Status  On-going      OT SHORT TERM GOAL #8   Title  Pt will demonstrate ability to hold light objects in L hand while manipulating with R hand (i.e opening containers) with min a    Status  On-going      OT SHORT TERM GOAL  #9   TITLE  Pt will be mod a for LB dressing    Status  On-going      OT SHORT TERM GOAL  #10   TITLE  Pt/family will be mod I with splint wear and care, prn    Status  On-going        OT Long Term Goals - 12/18/17 1633      OT LONG TERM GOAL #1   Title  Pt and family will be mod I with upgraded HEP/home activities program - 03/08/2018    Status  New      OT LONG TERM GOAL #2   Title  Pt will be min a for LB dressing    Status  New      OT LONG TERM GOAL #3   Title  Pt will be mod I with toilet transfers    Status  New      OT LONG TERM GOAL #4   Title  Pt will be mod I with clothing manipulation with toileting activities    Status  New      OT LONG TERM GOAL #5   Title  Pt will be min a for simple familiar hot meal prep    Status  New      OT LONG TERM GOAL #6   Title  Pt will demonstate ability to use LUE as stabilzer at least 25% of the time during basic self care and fishing activities.    Status  New  OT LONG TERM GOAL #7   Title  Pt will be mod I with casting simple fishing pole    Status  New      OT LONG TERM GOAL #8   Title  Pt will demonstate adequate postural alignment and control to complete simple activities in standing without UE support.     Status  New            Plan - 01/04/18 0947    Clinical Impression Statement  Pt making excellent progress toward goals.  Pt with improving sense of midline, basic functional mobility and postural alignment    Occupational Profile and client history currently impacting functional  performance  PMH: husband, father, grand father, retiree.  Pt loves the outdoors and fishing.  PMH: CAD, S/P CABG, DM, HTN, HLD, AFIB, CHF, seizure, PVD, obesity, ETHO abuse, TIA in 03/2017.      Occupational performance deficits (Please refer to evaluation for details):  ADL's;IADL's;Leisure;Social Participation;Rest and Sleep    Rehab Potential  Good    Current Impairments/barriers affecting progress:  medical history, severity of motor deficits    OT Frequency  2x / week    OT Duration  12 weeks    OT Treatment/Interventions  Self-care/ADL training;Aquatic Therapy;Electrical Stimulation;Moist Heat;Fluidtherapy;Ultrasound;Therapeutic exercise;Neuromuscular education;Energy conservation;DME and/or AE instruction;Passive range of motion;Manual Therapy;Functional Mobility Training;Splinting;Therapeutic activities;Patient/family education;Balance training    Plan  check LB dressing and address futher education PRN, manual therapy to address LUE shouler girdle alignment, NMR for LUE, balance, functional mobility, ADL's,     Consulted and Agree with Plan of Care  Patient    Family Member Consulted  wife, dtr       Patient will benefit from skilled therapeutic intervention in order to improve the following deficits and impairments:  Abnormal gait, Decreased balance, Decreased range of motion, Decreased mobility, Decreased knowledge of use of DME, Decreased strength, Difficulty walking, Impaired UE functional use, Impaired tone, Impaired flexibility, Pain  Visit Diagnosis: Hemiplegia and hemiparesis following cerebral infarction affecting left non-dominant side (HCC)  Unsteadiness on feet  Spastic hemiplegia of left nondominant side as late effect of cerebral infarction (HCC)  Muscle weakness (generalized)  Abnormal posture  Chronic left shoulder pain  Pain in left wrist  Stiffness of left shoulder, not elsewhere classified  Stiffness of left wrist, not elsewhere classified    Problem  List Patient Active Problem List   Diagnosis Date Noted  . 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 Acute blood loss anemia   . Chronic diastolic (congestive) heart failure ([place])   . Acute idiopathic gout of right knee   . Primary osteoarthritis of right knee   . Hemiparesis affecting left side as late effect of cerebrovascular accident (CVA) ([place])   . Diabetes mellitus type 2 in obese ([place])   . Small vessel disease ([place]) 08/11/2017  . Left hemiparesis ([place])   . Benign essential hypertension with delivery   . CVA (cerebral vascular accident) ([place]) 08/10/2017  . 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 of localized onset, not intractable, without status epilepticus ([place]) 06/20/2017  . Primary osteoarthritis of both knees 05/11/2017  . Stroke (cerebrum) ([place]) 03/21/2017  . Type 2 diabetes mellitus with complication, with long-term current use of insulin ([place]) 03/27/2015  . Atherosclerosis of native coronary artery of native heart without angina pectoris 03/27/2015  . Palpitations 11/04/2014  . S/P CABG (coronary artery bypass graft) 11/04/2014  . Atrial fibrillation, unspecified   . History of stroke  09/04/2014  . Obesity (BMI 30-39.9) 11/29/2013  . History of gout 05/16/2013  . Congestive heart failure ([place]) 04/20/2009  . DYSPNEA 02/11/2009  . CARDIOVASCULAR FUNCTION STUDY, ABNORMAL 01/29/2009  . NECK PAIN 08/03/2007  . ANEMIA / OTHER 05/16/2007  . Coronary atherosclerosis 05/16/2007  . PAROXYSMAL ATRIAL FIBRILLATION 05/16/2007  . Subcortical infarction ([place]) 05/16/2007  . Hyperlipidemia 03/30/2007  . DEGENERATIVE JOINT DISEASE 03/30/2007  . PAROTID LESION, UNSPECIFIED 09/29/2006  . Partial epilepsy with impairment of consciousness ([place]) 09/29/2006  . Diabetic polyneuropathy ([place]) 09/29/2006  . Essential hypertension 09/29/2006  . PERIPHERAL VASCULAR DISEASE 09/29/2006  . DIVERTICULOSIS 09/29/2006     Quay Burow, OTR/L 01/04/2018, 9:51 AM  [facility] [address] [place], [place], [address] Phone: [phone]   Fax:  [phone]  Name: NATAN HARTOG MRN: [id number] Date of Birth: 10/21/1935

## 2018-01-04 NOTE — Therapy (Signed)
[facility] [address] [place], [place], [address] Phone: [phone]   Fax:  [phone]  Physical Therapy Treatment  Patient Details  Name: Fred [name] MRN: [id number] Date of Birth: 1935/05/27 Referring Provider (PT): Dr. Letta Pate   Encounter Date: 01/04/2018  PT End of Session - 01/04/18 1040    Visit Number  5    Number of Visits  17    Date for PT Re-Evaluation  02/12/18    Authorization Type  Medicare, UHC, Tricare (no PTAs). Progress note every 10th visit.     PT Start Time  [phone]    PT Stop Time  1017    PT Time Calculation (min)  39 min    Equipment Utilized During Treatment  --   min guard to min A prn   Activity Tolerance  Patient tolerated treatment well    Behavior During Therapy  WFL for tasks assessed/performed       Past Medical History:  Diagnosis Date  . Atrial fibrillation ([place])    Post operative  . CAD (coronary artery disease)    s/p cabg  . CHF (congestive heart failure) ([place])   . Diverticulosis   . DJD (degenerative joint disease)   . DM (diabetes mellitus) ([place])   . History of anemia   . History of stroke   . Hyperlipidemia   . Hypertension   . 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 Peripheral vascular disease ([place])   . Polyneuropathy, diabetic ([place])   . Seizure disorder, complex partial ([place])   . Syncope and collapse     Past Surgical History:  Procedure Laterality Date  . CATARACT EXTRACTION W/ INTRAOCULAR LENS  IMPLANT, BILATERAL  06/2013   Dr. [name]  . CORONARY ARTERY BYPASS GRAFT  02/13/2004   4 vesel  . removal of sebaceous cyst from neck  08/2007   Dr. [name]  . TONSILLECTOMY      There were no vitals filed for this visit.  Subjective Assessment - 01/04/18 0940    Subjective  Pt denied falls or changes since last visit. Pt has L AFO donned upon entering gym. OT and PT spoke prior to appt and OT said car txfs are challenging. Pt's  dtr reported at end of session that pt's wife has dementia.     Patient is accompained by:  Family member   dtr and wife   Pertinent History  DM with polyneuropathy, hx of cataracts, hx of CVA in 03/2017 and again 07/2017, OA in R knee, DJD, HTN, HLD, coronary atherosclerosis, a-fib, CHF, PVD, parotid lesion, diverticulitis, partial epilepsy, anemia    Patient Stated Goals  get stronger; able to walk and transfer better    Currently in Pain?  No/denies                       [facility] Adult PT Treatment/Exercise - 01/04/18 1028      Transfers   Transfers  Sit to [facility]    Sit to Stand  4: Min guard;4: Min assist;With upper extremity assist    Sit to Stand Details  Tactile cues for weight shifting;Verbal cues for sequencing;Verbal cues for technique;Visual cues for safe use of DME/AE;Verbal cues for precautions/safety    Sit to Stand Details (indicate cue type and reason)  Pt able to verbalize set up for STS txf (ant. weight shifting and foot placement) however he required cues for hand placement and improved  trunk flexion.     Stand to Sit  4: Min guard;With upper extremity assist    Stand Pivot Transfers  4: Min assist;4: Min guard    Stand Pivot Transfer Details (indicate cue type and reason)  Pt performed stand pivot txfs to/from w/c to car x3 reps with extensive cues (verbal, tactile, demo, and manual facilitation) to improve upright posture (engage glutes, and L quads), weight shifting in lateral directions-especially for R weight shifting as pt pushes to the L side.     Number of Reps  Other reps (comment)   3 reps   Transfer Cueing  See above.    Comments  Txfs: NMR based on engaging musculature and improved balance/weight shifting. PT had pt utilize RW with L h/o for all txfs, per discussion with OT. PT also demonstrated how pt can utilize RLE to hook LLE and txf both LEs into/out of car safety with incr. IND.           Balance Exercises - 01/04/18  1037      Balance Exercises: Standing   Other Standing Exercises  Pt performed lateral weight shifting from R to L in order to improve weight shifting to encourage stepping vs. pivot during stand pivot txfs. Performed with min guard to min A for safety. Cues for technique and to keep glutes and L quads engage. x10 reps/direction.        PT Education - 01/04/18 1036    Education Details  PT provided pt, pt's dtr, and pt's wife with demo and cues for proper weight shifting and car txf technique. Reiterated the importance of performing HEP at home.     Person(s) Educated  Patient;Spouse;Child(ren)    Methods  Explanation;Demonstration;Tactile cues;Verbal cues    Comprehension  Verbalized understanding;Returned demonstration;Need further instruction       PT Short Term Goals - 12/14/17 1228      PT SHORT TERM GOAL #1   Title  Pt will be IND in HEP to improve the deficits listed above. TARGET DATE FOR ALL STGS: 01/11/18    Status  New      PT SHORT TERM GOAL #2   Title  Pt will be able to standing without UE support and feet apart for 2 minutes, with S, to perform ADLs safely.     Status  New      PT SHORT TERM GOAL #3   Title  Perform TUG, gait speed, and BERG-write STGs/LTGs, once tolerated.     Status  New      PT SHORT TERM GOAL #4   Title  Pt will amb. 100' with LRAD, over even terrain, at MOD I level to improve functional mobility.     Status  New        PT Long Term Goals - 12/14/17 1229      PT LONG TERM GOAL #1   Title  Pt will verbalize understanding of CVA risk factors and s/s of CVA to reduce risk of additional CVA. TARGET DATE FOR ALL LTGS: 02/08/18    Status  New      PT LONG TERM GOAL #2   Title  Pt will amb. 200' outdoors, with LRAD, with S, in order to walk in his yard safely.     Status  New      PT LONG TERM GOAL #3   Title  Trial manual w/c outdoors and write goal as indicated.     Status  New  Plan - 01/04/18 1041    Clinical  Impression Statement  Today's skilled session focused on car txf safety, incr. pt's IND with txfs, and pt family education on HEP and txfs. Pt noted to demonstrate pushing towards L side (weaker side) and required extensive cues and demo to weight shift towards R side in order to step with L foot during stand pivot txfs safely. Pt demonstrated improved safety during car txfs with use of RW and L h/o placed on RW. However, pt continues to require extensive cues to improve lateral weight shifting, trunk flexion during stand to sit, and hand placement. Pt would continue to benefit from skilled PT to improve safety during functional mobility.     Rehab Potential  Good    Clinical Impairments Affecting Rehab Potential  left side weakness; poor left side motor control    PT Frequency  2x / week    PT Duration  8 weeks    PT Treatment/Interventions  ADLs/Self Care Home Management;Biofeedback;Canalith Repostioning;Electrical Stimulation;Therapeutic activities;Therapeutic exercise;Manual techniques;Vestibular;Wheelchair mobility training;Functional mobility training;Orthotic Fit/Training;Stair training;Gait training;Patient/family education;DME Instruction;Neuromuscular re-education;Balance training    PT Next Visit Plan  Check STGs. CVA ed handout? cont to work on transfers (STS and stand pivot with RW) ;make sure he is getting weight shift correct;  scifit was good tool for recipriocal mvt and UE involvement; he wants to try to get all the way around the gym with (RW with L h/o)    Consulted and Agree with Plan of Care  Patient;Family member/caregiver    Family Member Consulted  wife       Patient will benefit from skilled therapeutic intervention in order to improve the following deficits and impairments:  Abnormal gait, Decreased endurance, Decreased knowledge of use of DME, Decreased strength, Impaired UE functional use, Decreased balance, Decreased mobility, Decreased range of motion, Decreased  coordination, Impaired flexibility, Postural dysfunction  Visit Diagnosis: Unsteadiness on feet  Other abnormalities of gait and mobility  Muscle weakness (generalized)  Hemiplegia and hemiparesis following cerebral infarction affecting left non-dominant side [facility])     Problem List Patient Active Problem List   Diagnosis Date Noted  . 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 Acute blood loss anemia   . Chronic diastolic (congestive) heart failure ([place])   . Acute idiopathic gout of right knee   . Primary osteoarthritis of right knee   . Hemiparesis affecting left side as late effect of cerebrovascular accident (CVA) ([place])   . Diabetes mellitus type 2 in obese ([place])   . Small vessel disease ([place]) 08/11/2017  . Left hemiparesis ([place])   . Benign essential hypertension with delivery   . CVA (cerebral vascular accident) ([place]) 08/10/2017  . 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 of localized onset, not intractable, without status epilepticus ([place]) 06/20/2017  . Primary osteoarthritis of both knees 05/11/2017  . Stroke (cerebrum) ([place]) 03/21/2017  . Type 2 diabetes mellitus with complication, with long-term current use of insulin ([place]) 03/27/2015  . Atherosclerosis of native coronary artery of native heart without angina pectoris 03/27/2015  . Palpitations 11/04/2014  . S/P CABG (coronary artery bypass graft) 11/04/2014  . Atrial fibrillation, unspecified   . History of stroke 09/04/2014  . Obesity (BMI 30-39.9) 11/29/2013  . History of gout 05/16/2013  . Congestive heart failure ([place]) 04/20/2009  . DYSPNEA 02/11/2009  . CARDIOVASCULAR FUNCTION STUDY, ABNORMAL 01/29/2009  . NECK PAIN 08/03/2007  . ANEMIA / OTHER 05/16/2007  . Coronary atherosclerosis 05/16/2007  .  PAROXYSMAL ATRIAL FIBRILLATION 05/16/2007  . Subcortical infarction ([place]) 05/16/2007  . Hyperlipidemia 03/30/2007  . DEGENERATIVE JOINT  DISEASE 03/30/2007  . PAROTID LESION, UNSPECIFIED 09/29/2006  . Partial epilepsy with impairment of consciousness ([place]) 09/29/2006  . Diabetic polyneuropathy ([place]) 09/29/2006  . Essential hypertension 09/29/2006  . PERIPHERAL VASCULAR DISEASE 09/29/2006  . DIVERTICULOSIS 09/29/2006    Jersie Beel L 01/04/2018, 10:51 AM  [facility] [address] [place], [place], [address] Phone: [phone]   Fax:  [phone]  Name: Fred [name] MRN: [id number] Date of Birth: 07-23-1935  Geoffry Paradise, PT,DPT 01/04/18 10:52 AM Phone: [phone] Fax: [phone]

## 2018-01-04 NOTE — Patient Instructions (Signed)
Lower body dressing:  He can take his shoes and brace off (he needs help to put them on). Work at [employer]. Make sure he has room to lean forward.  1. Scoot forward in chair 2. Get your feet where they need to be 3. Lean forward and push with arm on wheelchair  4. Cue him to push with his legs (Stand up tall) 5. You will support him under his left arm and with a hand on his back.  DO NOT LIFT OR PULL ON HIM. 6. Once standing he is responsible for his balance - YOU will scoot his pants down 7. Sit down and let him take them of his feet.  CUE HIM TO REACH BACK FOR ARM REST AND TO REACH FOR THE SEAT WITH HIS BOTTOM  To put pants on: 1. You will need to dress the pants over his left foot first. 2. Cue him to put the pants on the floor and step into his pants with his right foot. T 3. Lean forward and pull pants up to thighs 4. You will use the same process as above to stand to pull UP pants.  5. He is responsible for balance and YOU are responsible for pulling up his pants. 6. You will need to help with socks and shoes at this point.

## 2018-01-09 ENCOUNTER — Encounter: Payer: Self-pay | Admitting: Physical Therapy

## 2018-01-09 ENCOUNTER — Encounter: Payer: Self-pay | Admitting: Occupational Therapy

## 2018-01-09 ENCOUNTER — Ambulatory Visit: Payer: Medicare Other | Admitting: Physical Therapy

## 2018-01-09 ENCOUNTER — Ambulatory Visit: Payer: Medicare Other | Admitting: Occupational Therapy

## 2018-01-09 DIAGNOSIS — R2681 Unsteadiness on feet: Secondary | ICD-10-CM | POA: Diagnosis not present

## 2018-01-09 DIAGNOSIS — M25532 Pain in left wrist: Secondary | ICD-10-CM

## 2018-01-09 DIAGNOSIS — R2689 Other abnormalities of gait and mobility: Secondary | ICD-10-CM

## 2018-01-09 DIAGNOSIS — M25512 Pain in left shoulder: Secondary | ICD-10-CM

## 2018-01-09 DIAGNOSIS — R293 Abnormal posture: Secondary | ICD-10-CM

## 2018-01-09 DIAGNOSIS — I69354 Hemiplegia and hemiparesis following cerebral infarction affecting left non-dominant side: Secondary | ICD-10-CM

## 2018-01-09 DIAGNOSIS — G8929 Other chronic pain: Secondary | ICD-10-CM

## 2018-01-09 DIAGNOSIS — M25632 Stiffness of left wrist, not elsewhere classified: Secondary | ICD-10-CM

## 2018-01-09 DIAGNOSIS — M25612 Stiffness of left shoulder, not elsewhere classified: Secondary | ICD-10-CM

## 2018-01-09 DIAGNOSIS — M6281 Muscle weakness (generalized): Secondary | ICD-10-CM | POA: Diagnosis not present

## 2018-01-09 NOTE — Therapy (Signed)
[facility] [address] [place], [place], [address] Phone: [phone]   Fax:  [phone]  Physical Therapy Treatment  Patient Details  Name: Fred [name] MRN: [id number] Date of Birth: 02-17-1936 Referring Provider (PT): Dr. Letta Pate   Encounter Date: 01/09/2018  PT End of Session - 01/09/18 2048    Visit Number  6    Number of Visits  17    Date for PT Re-Evaluation  02/12/18    Authorization Type  Medicare, UHC, Tricare (no PTAs). Progress note every 10th visit.     PT Start Time  0932    PT Stop Time  1016    PT Time Calculation (min)  44 min    Equipment Utilized During Treatment  Gait belt       Past Medical History:  Diagnosis Date  . Atrial fibrillation ([place])    Post operative  . CAD (coronary artery disease)    s/p cabg  . CHF (congestive heart failure) ([place])   . Diverticulosis   . DJD (degenerative joint disease)   . DM (diabetes mellitus) ([place])   . History of anemia   . History of stroke   . Hyperlipidemia   . Hypertension   . 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 Peripheral vascular disease ([place])   . Polyneuropathy, diabetic ([place])   . Seizure disorder, complex partial ([place])   . Syncope and collapse     Past Surgical History:  Procedure Laterality Date  . CATARACT EXTRACTION W/ INTRAOCULAR LENS  IMPLANT, BILATERAL  06/2013   Dr. [name]  . CORONARY ARTERY BYPASS GRAFT  02/13/2004   4 vesel  . removal of sebaceous cyst from neck  08/2007   Dr. [name]  . TONSILLECTOMY      There were no vitals filed for this visit.  Subjective Assessment - 01/09/18 2039    Subjective  Pt reports he is not walking very much at home (because he does not have anyone to help him):  states car transfers are going OK "as long as somebody helps me"    Pertinent History  DM with polyneuropathy, hx of cataracts, hx of CVA in 03/2017 and again 07/2017, OA in R knee, DJD, HTN, HLD,  coronary atherosclerosis, a-fib, CHF, PVD, parotid lesion, diverticulitis, partial epilepsy, anemia    Patient Stated Goals  get stronger; able to walk and transfer better                 Pt transferred from his scooter to mat toward left side with mod to max assist (difficulty maintaining balance and moving LLE)       [facility] Adult PT Treatment/Exercise - 01/09/18 1008      Transfers   Transfers  Sit to Stand;Stand to Sit    Sit to Stand  4: Min guard    Sit to Stand Details  Tactile cues for placement;Verbal cues for technique;Verbal cues for sequencing;Verbal cues for safe use of DME/AE    Stand to Sit  4: Min assist    Comments  LUE used from mat       Ambulation/Gait   Ambulation/Gait  Yes    Ambulation/Gait Assistance  3: Mod assist    Ambulation/Gait Assistance Details  tactile and verbal cues given for Lt knee extension prior to RLE swing through     Ambulation Distance (Feet)  25 Feet   20', 30', 40'   Assistive device  Hemi-walker  Gait Pattern  Step-to pattern;Decreased weight shift to left;Poor foot clearance - left    Ambulation Surface  Level;Indoor    Gait Comments  initially tried using RW with hand orthosis for LUE but pt unable to use this device due to increased flexor tone resulting in Lt hand not holding RW for grip; therefore, hemiwalker was used      NeuroRe-ed:  Standing with use of RW for RUE support with mod assist - lateral weight shift to increase weight shift   Onto LLE  Standing at steps with RUE support on hand rail - mod assist given for balance with tactile cue on Lt knee for extension to prevent  Buckling - tap ups onto 1st step with RLE for increased weight bearing on LLE; 10 reps          PT Short Term Goals - 12/14/17 1228      PT SHORT TERM GOAL #1   Title  Pt will be IND in HEP to improve the deficits listed above. TARGET DATE FOR ALL STGS: 01/11/18    Status  New      PT SHORT TERM GOAL #2   Title  Pt will be able  to standing without UE support and feet apart for 2 minutes, with S, to perform ADLs safely.     Status  New      PT SHORT TERM GOAL #3   Title  Perform TUG, gait speed, and BERG-write STGs/LTGs, once tolerated.     Status  New      PT SHORT TERM GOAL #4   Title  Pt will amb. 100' with LRAD, over even terrain, at MOD I level to improve functional mobility.     Status  New        PT Long Term Goals - 12/14/17 1229      PT LONG TERM GOAL #1   Title  Pt will verbalize understanding of CVA risk factors and s/s of CVA to reduce risk of additional CVA. TARGET DATE FOR ALL LTGS: 02/08/18    Status  New      PT LONG TERM GOAL #2   Title  Pt will amb. 200' outdoors, with LRAD, with S, in order to walk in his yard safely.     Status  New      PT LONG TERM GOAL #3   Title  Trial manual w/c outdoors and write goal as indicated.     Status  New            Plan - 01/09/18 2048    Clinical Impression Statement  Pt has difficulty with ambulation due to increased tone in LLE and Lt quad weakness with pt requiring tactile and verbal cues to extend Lt knee prior to stepping with RLE.  Pt continues to have difficulty with RLE swing through and clearance even though leather toe cap is on Lt shoe.   Pt fatigues quickly but was able to increase ambulation distance to 40' at end of session prior to need for seated rest period.      Rehab Potential  Good    Clinical Impairments Affecting Rehab Potential  left side weakness; poor left side motor control    PT Frequency  2x / week    PT Duration  8 weeks    PT Treatment/Interventions  ADLs/Self Care Home Management;Biofeedback;Canalith Repostioning;Electrical Stimulation;Therapeutic activities;Therapeutic exercise;Manual techniques;Vestibular;Wheelchair mobility training;Functional mobility training;Orthotic Fit/Training;Stair training;Gait training;Patient/family education;DME Instruction;Neuromuscular re-education;Balance training    PT Next Visit  Plan  Check STGs (DUE 01-11-18): CVA ed handout? cont to work on transfers (STS and stand pivot with RW) ;make sure he is getting weight shift correct;  scifit was good tool for recipriocal mvt and UE involvement; he wants to try to get all the way around the gym with (RW with L h/o)    Consulted and Agree with Plan of Care  Patient;Family member/caregiver    Family Member Consulted  son       Patient will benefit from skilled therapeutic intervention in order to improve the following deficits and impairments:  Abnormal gait, Decreased endurance, Decreased knowledge of use of DME, Decreased strength, Impaired UE functional use, Decreased balance, Decreased mobility, Decreased range of motion, Decreased coordination, Impaired flexibility, Postural dysfunction  Visit Diagnosis: Spastic hemiplegia of left nondominant side as late effect of cerebral infarction [facility])  Other abnormalities of gait and mobility     Problem List Patient Active Problem List   Diagnosis Date Noted  . 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 Acute blood loss anemia   . Chronic diastolic (congestive) heart failure ([place])   . Acute idiopathic gout of right knee   . Primary osteoarthritis of right knee   . Hemiparesis affecting left side as late effect of cerebrovascular accident (CVA) ([place])   . Diabetes mellitus type 2 in obese ([place])   . Small vessel disease ([place]) 08/11/2017  . Left hemiparesis ([place])   . Benign essential hypertension with delivery   . CVA (cerebral vascular accident) ([place]) 08/10/2017  . 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 of localized onset, not intractable, without status epilepticus ([place]) 06/20/2017  . Primary osteoarthritis of both knees 05/11/2017  . Stroke (cerebrum) ([place]) 03/21/2017  . Type 2 diabetes mellitus with complication, with long-term current use of insulin ([place]) 03/27/2015  . Atherosclerosis of native  coronary artery of native heart without angina pectoris 03/27/2015  . Palpitations 11/04/2014  . S/P CABG (coronary artery bypass graft) 11/04/2014  . Atrial fibrillation, unspecified   . History of stroke 09/04/2014  . Obesity (BMI 30-39.9) 11/29/2013  . History of gout 05/16/2013  . Congestive heart failure ([place]) 04/20/2009  . DYSPNEA 02/11/2009  . CARDIOVASCULAR FUNCTION STUDY, ABNORMAL 01/29/2009  . NECK PAIN 08/03/2007  . ANEMIA / OTHER 05/16/2007  . Coronary atherosclerosis 05/16/2007  . PAROXYSMAL ATRIAL FIBRILLATION 05/16/2007  . Subcortical infarction ([place]) 05/16/2007  . Hyperlipidemia 03/30/2007  . DEGENERATIVE JOINT DISEASE 03/30/2007  . PAROTID LESION, UNSPECIFIED 09/29/2006  . Partial epilepsy with impairment of consciousness ([place]) 09/29/2006  . Diabetic polyneuropathy ([place]) 09/29/2006  . Essential hypertension 09/29/2006  . PERIPHERAL VASCULAR DISEASE 09/29/2006  . DIVERTICULOSIS 09/29/2006    Alda Lea, PT 01/09/2018, 8:55 PM  [facility] [address] [place], [place], [address] Phone: [phone]   Fax:  [phone]  Name: REAKWON BARREN MRN: [id number] Date of Birth: 1935-10-11

## 2018-01-09 NOTE — Therapy (Signed)
[facility] [address] [place], [place], [address] Phone: [phone]   Fax:  [phone]  Occupational Therapy Treatment  Patient Details  Name: Fred [name] MRN: [id number] Date of Birth: 1935-10-28 Referring Provider (OT): Dr. Alysia Penna   Encounter Date: 01/09/2018  OT End of Session - 01/09/18 1247    Visit Number  7    Number of Visits  24    Date for OT Re-Evaluation  03/08/18    Authorization Type  Medicare, Tricare, UHC - pt will need PN every 10th visit    Authorization Time Period  90 days    Authorization - Visit Number  7    Authorization - Number of Visits  10    OT Start Time  1016    OT Stop Time  1100    OT Time Calculation (min)  44 min    Activity Tolerance  Patient tolerated treatment well       Past Medical History:  Diagnosis Date  . Atrial fibrillation ([place])    Post operative  . CAD (coronary artery disease)    s/p cabg  . CHF (congestive heart failure) ([place])   . Diverticulosis   . DJD (degenerative joint disease)   . DM (diabetes mellitus) ([place])   . History of anemia   . History of stroke   . Hyperlipidemia   . Hypertension   . 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 Peripheral vascular disease ([place])   . Polyneuropathy, diabetic ([place])   . Seizure disorder, complex partial ([place])   . Syncope and collapse     Past Surgical History:  Procedure Laterality Date  . CATARACT EXTRACTION W/ INTRAOCULAR LENS  IMPLANT, BILATERAL  06/2013   Dr. [name]  . CORONARY ARTERY BYPASS GRAFT  02/13/2004   4 vesel  . removal of sebaceous cyst from neck  08/2007   Dr. [name]  . TONSILLECTOMY      There were no vitals filed for this visit.  Subjective Assessment - 01/09/18 1022    Subjective   Its scary to stand but I like it    Patient is accompained by:  Family member   son   Pertinent History  [name] 08/09/2017. DIscharged home on 09/22/2017 after inpt  rehab stay. Pt had HH PT and OT. PMH:      Patient Stated Goals  I want to get walking again.  I am an outdoor guy    Currently in Pain?  Yes    Pain Score  3     Pain Location  Knee    Pain Orientation  Right    Pain Descriptors / Indicators  Sharp   arthritis   Pain Type  Chronic pain    Pain Onset  More than a month ago    Pain Frequency  Intermittent    Aggravating Factors   going from sitting to standing and standing to sitting.      Pain Relieving Factors  avoiding the above, meds                   OT Treatments/Exercises (OP) - 01/09/18 0001      ADLs   ADL Comments  Discussed possibiliy of using aquatic therapy as part of POC - pt has pool in backyard and is very interested in this.  Educated pt and son on process and will look to schedule pt as soon as possible (currently waiting list).  Neurological Re-education Exercises   Other Exercises 1  Neuro re ed initially to focus on trunk alignment and reducing tone in RUE in preparation for use of RUE with walker with hand orthosis.  Addressed via weight bearing, body on arm progressing to arm on body in low planes with emphasis on extension patterns. Progressed to sit to stand without UE's and then standing using RW (with hand orthosis) for static standing and then lateral weight shifting. Pt able to progress to stepping with RLE to faciltate activation of RUE,trunk and RLE in standing.  Also addressed sit to stand and stand to sit.                OT Short Term Goals - 12/18/17 1530      OT SHORT TERM GOAL #1   Title  Pt and family will be mod I with basic home exercise/home activities program for LUE, balance and functional mobility - 01/25/2018    Status  On-going      OT SHORT TERM GOAL #2   Title  Pt will be supervision with UB bathing    Status  On-going      OT SHORT TERM GOAL #3   Title  Pt will be min a for LB bathing    Status  On-going      OT SHORT TERM GOAL #4   Title  Pt will be close  supervision for toilet transfers    Status  On-going      OT SHORT TERM GOAL #5   Title  Pt will be min a for tub bench transfers (assist with LLE)    Status  On-going      OT SHORT TERM GOAL #6   Title  Pt will tolerate shoulder flexion to 110* with no pain in supine in prep for HEP and low functional reach    Status  On-going      OT SHORT TERM GOAL #7   Title  Pt will demonstrate shoulder flexion to at least 30* in prep for low reach    Status  On-going      OT SHORT TERM GOAL #8   Title  Pt will demonstrate ability to hold light objects in L hand while manipulating with R hand (i.e opening containers) with min a    Status  On-going      OT SHORT TERM GOAL  #9   TITLE  Pt will be mod a for LB dressing    Status  On-going      OT SHORT TERM GOAL  #10   TITLE  Pt/family will be mod I with splint wear and care, prn    Status  On-going        OT Long Term Goals - 12/18/17 1633      OT LONG TERM GOAL #1   Title  Pt and family will be mod I with upgraded HEP/home activities program - 03/08/2018    Status  New      OT LONG TERM GOAL #2   Title  Pt will be min a for LB dressing    Status  New      OT LONG TERM GOAL #3   Title  Pt will be mod I with toilet transfers    Status  New      OT LONG TERM GOAL #4   Title  Pt will be mod I with clothing manipulation with toileting activities    Status  New  OT LONG TERM GOAL #5   Title  Pt will be min a for simple familiar hot meal prep    Status  New      OT LONG TERM GOAL #6   Title  Pt will demonstate ability to use LUE as stabilzer at least 25% of the time during basic self care and fishing activities.    Status  New      OT LONG TERM GOAL #7   Title  Pt will be mod I with casting simple fishing pole    Status  New      OT LONG TERM GOAL #8   Title  Pt will demonstate adequate postural alignment and control to complete simple activities in standing without UE support.     Status  New            Plan -  01/09/18 1246    Clinical Impression Statement  Pt continues to make good progress toward goals. Pt interested in pursuing aquatic therapy.     Occupational Profile and client history currently impacting functional performance  PMH: husband, father, grand father, retiree.  Pt loves the outdoors and fishing.  PMH: CAD, S/P CABG, DM, HTN, HLD, AFIB, CHF, seizure, PVD, obesity, ETHO abuse, TIA in 03/2017.      Occupational performance deficits (Please refer to evaluation for details):  ADL's;IADL's;Leisure;Social Participation;Rest and Sleep    Rehab Potential  Good    Current Impairments/barriers affecting progress:  medical history, severity of motor deficits    OT Frequency  2x / week    OT Duration  12 weeks    OT Treatment/Interventions  Self-care/ADL training;Aquatic Therapy;Electrical Stimulation;Moist Heat;Fluidtherapy;Ultrasound;Therapeutic exercise;Neuromuscular education;Energy conservation;DME and/or AE instruction;Passive range of motion;Manual Therapy;Functional Mobility Training;Splinting;Therapeutic activities;Patient/family education;Balance training    Plan  check LB dressing and address futher education PRN, manual therapy to address LUE shouler girdle alignment, NMR for LUE, balance, functional mobility, ADL's,     Consulted and Agree with Plan of Care  Patient    Family Member Consulted  son       Patient will benefit from skilled therapeutic intervention in order to improve the following deficits and impairments:  Abnormal gait, Decreased balance, Decreased range of motion, Decreased mobility, Decreased knowledge of use of DME, Decreased strength, Difficulty walking, Impaired UE functional use, Impaired tone, Impaired flexibility, Pain  Visit Diagnosis: Unsteadiness on feet  Muscle weakness (generalized)  Spastic hemiplegia of left nondominant side as late effect of cerebral infarction ([facility])  Abnormal posture  Chronic left shoulder pain  Pain in left wrist  Stiffness  of left shoulder, not elsewhere classified  Stiffness of left wrist, not elsewhere classified    Problem List Patient Active Problem List   Diagnosis Date Noted  . 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 Acute blood loss anemia   . Chronic diastolic (congestive) heart failure ([place])   . Acute idiopathic gout of right knee   . Primary osteoarthritis of right knee   . Hemiparesis affecting left side as late effect of cerebrovascular accident (CVA) ([place])   . Diabetes mellitus type 2 in obese ([place])   . Small vessel disease ([place]) 08/11/2017  . Left hemiparesis ([place])   . Benign essential hypertension with delivery   . CVA (cerebral vascular accident) ([place]) 08/10/2017  . 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 of localized onset, not intractable, without status epilepticus ([place]) 06/20/2017  . Primary osteoarthritis of both knees 05/11/2017  . Stroke (cerebrum) (  [place]) 03/21/2017  . Type 2 diabetes mellitus with complication, with long-term current use of insulin ([place]) 03/27/2015  . Atherosclerosis of native coronary artery of native heart without angina pectoris 03/27/2015  . Palpitations 11/04/2014  . S/P CABG (coronary artery bypass graft) 11/04/2014  . Atrial fibrillation, unspecified   . History of stroke 09/04/2014  . Obesity (BMI 30-39.9) 11/29/2013  . History of gout 05/16/2013  . Congestive heart failure ([place]) 04/20/2009  . DYSPNEA 02/11/2009  . CARDIOVASCULAR FUNCTION STUDY, ABNORMAL 01/29/2009  . NECK PAIN 08/03/2007  . ANEMIA / OTHER 05/16/2007  . Coronary atherosclerosis 05/16/2007  . PAROXYSMAL ATRIAL FIBRILLATION 05/16/2007  . Subcortical infarction ([place]) 05/16/2007  . Hyperlipidemia 03/30/2007  . DEGENERATIVE JOINT DISEASE 03/30/2007  . PAROTID LESION, UNSPECIFIED 09/29/2006  . Partial epilepsy with impairment of consciousness ([place]) 09/29/2006  . Diabetic polyneuropathy ([place]) 09/29/2006  .  Essential hypertension 09/29/2006  . PERIPHERAL VASCULAR DISEASE 09/29/2006  . DIVERTICULOSIS 09/29/2006    Quay Burow, OTR/L 01/09/2018, 12:49 PM  [facility] [address] [place], [place], [address] Phone: [phone]   Fax:  [phone]  Name: Fred [name] MRN: [id number] Date of Birth: 1935-12-29

## 2018-01-11 ENCOUNTER — Encounter: Payer: Self-pay | Admitting: Family Medicine

## 2018-01-11 ENCOUNTER — Ambulatory Visit: Payer: Medicare Other

## 2018-01-11 ENCOUNTER — Encounter: Payer: Self-pay | Admitting: Occupational Therapy

## 2018-01-11 ENCOUNTER — Ambulatory Visit: Payer: Medicare Other | Admitting: Occupational Therapy

## 2018-01-11 ENCOUNTER — Ambulatory Visit (INDEPENDENT_AMBULATORY_CARE_PROVIDER_SITE_OTHER): Payer: Medicare Other | Admitting: Family Medicine

## 2018-01-11 VITALS — BP 117/86 | HR 78 | Ht 63.0 in | Wt 163.0 lb

## 2018-01-11 DIAGNOSIS — I69354 Hemiplegia and hemiparesis following cerebral infarction affecting left non-dominant side: Secondary | ICD-10-CM

## 2018-01-11 DIAGNOSIS — R2681 Unsteadiness on feet: Secondary | ICD-10-CM

## 2018-01-11 DIAGNOSIS — M6281 Muscle weakness (generalized): Secondary | ICD-10-CM | POA: Diagnosis not present

## 2018-01-11 DIAGNOSIS — M25612 Stiffness of left shoulder, not elsewhere classified: Secondary | ICD-10-CM

## 2018-01-11 DIAGNOSIS — M25512 Pain in left shoulder: Secondary | ICD-10-CM

## 2018-01-11 DIAGNOSIS — G8929 Other chronic pain: Secondary | ICD-10-CM | POA: Diagnosis not present

## 2018-01-11 DIAGNOSIS — Z8673 Personal history of transient ischemic attack (TIA), and cerebral infarction without residual deficits: Secondary | ICD-10-CM | POA: Diagnosis not present

## 2018-01-11 DIAGNOSIS — M25632 Stiffness of left wrist, not elsewhere classified: Secondary | ICD-10-CM

## 2018-01-11 DIAGNOSIS — R293 Abnormal posture: Secondary | ICD-10-CM

## 2018-01-11 DIAGNOSIS — M25532 Pain in left wrist: Secondary | ICD-10-CM

## 2018-01-11 DIAGNOSIS — I639 Cerebral infarction, unspecified: Secondary | ICD-10-CM | POA: Diagnosis not present

## 2018-01-11 MED ORDER — DICLOFENAC SODIUM 1 % TD GEL: 2.0000 g | Freq: Four times a day (QID) | TRANSDERMAL | 12 refills | Status: DC

## 2018-01-11 NOTE — Therapy (Signed)
[facility] [address] [place], [place], [address] Phone: [phone]   Fax:  [phone]  Occupational Therapy Treatment  Patient Details  Name: Fred [name] MRN: [id number] Date of Birth: [birth year] Referring Provider (OT): Dr. Alysia Penna   Encounter Date: 01/11/2018  OT End of Session - 01/11/18 1228    Visit Number  8    Number of Visits  24    Date for OT Re-Evaluation  03/08/18    Authorization Type  Medicare, Tricare, [employer] - pt will need PN every 10th visit    Authorization Time Period  90 days    Authorization - Visit Number  8    Authorization - Number of Visits  10    OT Start Time  0846    OT Stop Time  0930    OT Time Calculation (min)  44 min    Activity Tolerance  Patient tolerated treatment well       Past Medical History:  Diagnosis Date  . Atrial fibrillation ([place])    Post operative  . CAD (coronary artery disease)    s/p cabg  . CHF (congestive heart failure) ([place])   . Diverticulosis   . DJD (degenerative joint disease)   . DM (diabetes mellitus) ([place])   . History of anemia   . History of stroke   . Hyperlipidemia   . Hypertension   . 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 Peripheral vascular disease ([place])   . Polyneuropathy, diabetic ([place])   . Seizure disorder, complex partial ([place])   . Syncope and collapse     Past Surgical History:  Procedure Laterality Date  . CATARACT EXTRACTION W/ INTRAOCULAR LENS  IMPLANT, BILATERAL  06/2013   Dr. [name]  . CORONARY ARTERY BYPASS GRAFT  02/13/2004   4 vesel  . removal of sebaceous cyst from neck  08/2007   Dr. [name]  . TONSILLECTOMY      There were no vitals filed for this visit.  Subjective Assessment - 01/11/18 0855    Subjective   Knee but not right now - it was better this morning.    Patient is accompained by:  Family member   dtr and wife   Pertinent History  [name] 08/09/2017. DIscharged home  on 09/22/2017 after inpt rehab stay. Pt had HH PT and OT. PMH:      Patient Stated Goals  I want to get walking again.  I am an outdoor guy    Currently in Pain?  Yes                   OT Treatments/Exercises (OP) - 01/11/18 0001      Neurological Re-education Exercises   Other Exercises 1  Neuro re ed to address prep of LUE for use with walker via weight bearing and activation of body on arm folllowed by arm on body.  Pt with improved tone mgmt.  Pt's dtr and wife report that pt is now standing at home for LB dressing with wife's assistance. Discussed having family work on sit to stand, static standing, finding midline and stand to sit - all at sink in kitchen with wheelchair behind pt for safety. Dtr and wife verbalized understanding.  Also discussed apraxia, "pusher syndrome" and perecptual deficits.  Dtr and wife verbalized understanding and will continue to reinforce. Addressed sit to stand in midline with increased demand for RLE and trunk as well as standing with RW  for static standing with cues and facilitation for postural alignment and control. Progressed to stepping with RLE for forced use of LLE, LUE and trunk and cues to relax RUE.  Pt benefits from mirror for feedback. Pt beginning to be able to sense when he is falling or pushing to the left.                OT Short Term Goals - 12/18/17 1530      OT SHORT TERM GOAL #1   Title  Pt and family will be mod I with basic home exercise/home activities program for LUE, balance and functional mobility - 01/25/2018    Status  On-going      OT SHORT TERM GOAL #2   Title  Pt will be supervision with UB bathing    Status  On-going      OT SHORT TERM GOAL #3   Title  Pt will be min a for LB bathing    Status  On-going      OT SHORT TERM GOAL #4   Title  Pt will be close supervision for toilet transfers    Status  On-going      OT SHORT TERM GOAL #5   Title  Pt will be min a for tub bench transfers (assist with  LLE)    Status  On-going      OT SHORT TERM GOAL #6   Title  Pt will tolerate shoulder flexion to 110* with no pain in supine in prep for HEP and low functional reach    Status  On-going      OT SHORT TERM GOAL #7   Title  Pt will demonstrate shoulder flexion to at least 30* in prep for low reach    Status  On-going      OT SHORT TERM GOAL #8   Title  Pt will demonstrate ability to hold light objects in L hand while manipulating with R hand (i.e opening containers) with min a    Status  On-going      OT SHORT TERM GOAL  #9   TITLE  Pt will be mod a for LB dressing    Status  On-going      OT SHORT TERM GOAL  #10   TITLE  Pt/family will be mod I with splint wear and care, prn    Status  On-going        OT Long Term Goals - 12/18/17 1633      OT LONG TERM GOAL #1   Title  Pt and family will be mod I with upgraded HEP/home activities program - 03/08/2018    Status  New      OT LONG TERM GOAL #2   Title  Pt will be min a for LB dressing    Status  New      OT LONG TERM GOAL #3   Title  Pt will be mod I with toilet transfers    Status  New      OT LONG TERM GOAL #4   Title  Pt will be mod I with clothing manipulation with toileting activities    Status  New      OT LONG TERM GOAL #5   Title  Pt will be min a for simple familiar hot meal prep    Status  New      OT LONG TERM GOAL #6   Title  Pt will demonstate ability to use LUE as stabilzer at least  25% of the time during basic self care and fishing activities.    Status  New      OT LONG TERM GOAL #7   Title  Pt will be mod I with casting simple fishing pole    Status  New      OT LONG TERM GOAL #8   Title  Pt will demonstate adequate postural alignment and control to complete simple activities in standing without UE support.     Status  New            Plan - 01/11/18 1227    Clinical Impression Statement  Pt with slow progression toward goals. Pt with improving tolerance for LUE involvement and  movement.     Occupational Profile and client history currently impacting functional performance  PMH: husband, father, grand father, retiree.  Pt loves the outdoors and fishing.  PMH: CAD, S/P CABG, DM, HTN, HLD, AFIB, CHF, seizure, PVD, obesity, ETHO abuse, TIA in 03/2017.      Occupational performance deficits (Please refer to evaluation for details):  ADL's;IADL's;Leisure;Social Participation;Rest and Sleep    Rehab Potential  Good    Current Impairments/barriers affecting progress:  medical history, severity of motor deficits    OT Frequency  2x / week    OT Duration  12 weeks    OT Treatment/Interventions  Self-care/ADL training;Aquatic Therapy;Electrical Stimulation;Moist Heat;Fluidtherapy;Ultrasound;Therapeutic exercise;Neuromuscular education;Energy conservation;DME and/or AE instruction;Passive range of motion;Manual Therapy;Functional Mobility Training;Splinting;Therapeutic activities;Patient/family education;Balance training    Plan  check LB dressing and address futher education PRN, manual therapy to address LUE shouler girdle alignment, NMR for LUE, balance, functional mobility, ADL's,     Consulted and Agree with Plan of Care  Patient    Family Member Consulted  dtr and wife       Patient will benefit from skilled therapeutic intervention in order to improve the following deficits and impairments:  Abnormal gait, Decreased balance, Decreased range of motion, Decreased mobility, Decreased knowledge of use of DME, Decreased strength, Difficulty walking, Impaired UE functional use, Impaired tone, Impaired flexibility, Pain  Visit Diagnosis: Spastic hemiplegia of left nondominant side as late effect of cerebral infarction (HCC)  Unsteadiness on feet  Muscle weakness (generalized)  Abnormal posture  Chronic left shoulder pain  Pain in left wrist  Stiffness of left shoulder, not elsewhere classified  Stiffness of left wrist, not elsewhere classified    Problem  List Patient Active Problem List   Diagnosis Date Noted  . 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 Acute blood loss anemia   . Chronic diastolic (congestive) heart failure ([place])   . Acute idiopathic gout of right knee   . Primary osteoarthritis of right knee   . Hemiparesis affecting left side as late effect of cerebrovascular accident (CVA) ([place])   . Diabetes mellitus type 2 in obese ([place])   . Small vessel disease ([place]) 08/11/2017  . Left hemiparesis ([place])   . Benign essential hypertension with delivery   . CVA (cerebral vascular accident) ([place]) 08/10/2017  . 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 of localized onset, not intractable, without status epilepticus ([place]) 06/20/2017  . Primary osteoarthritis of both knees 05/11/2017  . Stroke (cerebrum) ([place]) 03/21/2017  . Type 2 diabetes mellitus with complication, with long-term current use of insulin ([place]) 03/27/2015  . Atherosclerosis of native coronary artery of native heart without angina pectoris 03/27/2015  . Palpitations 11/04/2014  . S/P CABG (coronary artery bypass graft) 11/04/2014  . Atrial  fibrillation, unspecified   . History of stroke 09/04/2014  . Obesity (BMI 30-39.9) 11/29/2013  . History of gout 05/16/2013  . Congestive heart failure ([place]) 04/20/2009  . DYSPNEA 02/11/2009  . CARDIOVASCULAR FUNCTION STUDY, ABNORMAL 01/29/2009  . NECK PAIN 08/03/2007  . ANEMIA / OTHER 05/16/2007  . Coronary atherosclerosis 05/16/2007  . PAROXYSMAL ATRIAL FIBRILLATION 05/16/2007  . Subcortical infarction ([place]) 05/16/2007  . Hyperlipidemia 03/30/2007  . DEGENERATIVE JOINT DISEASE 03/30/2007  . PAROTID LESION, UNSPECIFIED 09/29/2006  . Partial epilepsy with impairment of consciousness ([place]) 09/29/2006  . Diabetic polyneuropathy ([place]) 09/29/2006  . Essential hypertension 09/29/2006  . PERIPHERAL VASCULAR DISEASE 09/29/2006  . DIVERTICULOSIS 09/29/2006     Quay Burow , OTR/L 01/11/2018, 12:29 PM  [facility] [address] [place], [place], [address] Phone: [phone]   Fax:  [phone]  Name: JONN CHAIKIN MRN: [id number] Date of Birth: 05/08/1935

## 2018-01-11 NOTE — Progress Notes (Signed)
Subjective:    Patient ID: Fred [name], male    DOB: 18-Dec-1935, 82 y.o.   MRN: [id number]  HPI  56 male s/p stroke is here mobility assessment for a power wheelchair.  He unfortunately has residual effects from the stroke on Aug 09, 2017.  MRI confirmed an infarct in the right posterior limb of the internal capsule.  There was no associated hemorrhage or mass..  He has hemiparesis affecting the left side of his body. He is unable to walk without any assistance.  He is unable to toilet or even transition from chair to bed etc. without assistance.  He is unable to go to the kitchen to prepare his own food.  He has had already had a full physical therapy evaluation on November 14, 2017.  He has significant weakness in his left arm and is actually starting to get some mild contractures which he is getting physical therapy for.  He also now has a brace on his left lower leg to keep him from having foot drop.  Also has severe arthritis in his right knee with a lot of cracking popping and swelling.   Review of Systems  BP 117/86   Pulse 78   Ht 5\' 3"  (1.6 m)   Wt 163 lb (73.9 kg)   SpO2 97%   BMI 28.87 kg/m     Allergies  Allergen Reactions  . Lisinopril Cough    Past Medical History:  Diagnosis Date  . Atrial fibrillation ([place])    Post operative  . CAD (coronary artery disease)    s/p cabg  . CHF (congestive heart failure) ([place])   . Diverticulosis   . DJD (degenerative joint disease)   . DM (diabetes mellitus) ([place])   . History of anemia   . History of stroke   . Hyperlipidemia   . Hypertension   . 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 Peripheral vascular disease ([place])   . Polyneuropathy, diabetic ([place])   . Seizure disorder, complex partial ([place])   . Syncope and collapse     Past Surgical History:  Procedure Laterality Date  . CATARACT EXTRACTION W/ INTRAOCULAR LENS  IMPLANT, BILATERAL  06/2013   Dr. [name]  . CORONARY ARTERY BYPASS GRAFT   02/13/2004   4 vesel  . removal of sebaceous cyst from neck  08/2007   Dr. [name]  . TONSILLECTOMY      Social History   Socioeconomic History  . Marital status: Married    Spouse name: [name]  . Number of children: 3  . Years of education: Not on file  . Highest education level: Not on file  Occupational History  . 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 Financial resource strain: Not on file  . Food insecurity:    Worry: Not on file    Inability: Not on file  . Transportation needs:    Medical: Not on file    Non-medical: Not on file  Tobacco Use  . Smoking status: Former Smoker    Packs/day: 1.00    Years: 22.00    Pack years: 22.00    Types: Cigarettes    Last attempt to quit: 03/14/1977    Years since quitting: 40.8  . Smokeless tobacco: Never Used  Substance and Sexual Activity  . Alcohol use: Yes    Comment: Pt has not had alcohol since 03/2017  CVA  . Drug use: No  . Sexual activity: Never  Lifestyle  . Physical activity:    Days per week: Not on file    Minutes per session: Not on file  . Stress: Not on file  Relationships  . Social connections:    Talks on phone: Not on file    Gets together: Not on file    Attends religious service: Not on file    Active member of club or organization: Not on file    Attends meetings of clubs or organizations: Not on file    Relationship status: Not on file  . Intimate partner violence:    Fear of current or ex partner: Not on file    Emotionally abused: Not on file    Physically abused: Not on file    Forced sexual activity: Not on file  Other Topics Concern  . Not on file  Social History Narrative   3 caffeinated drinks per day. Mostly coffee. He walks 15 minutes 3 days per week. Quit smoking in 1978.      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     Family  History  Problem Relation Age of Onset  . Epilepsy Father   . Stroke Mother   . Lung cancer Unknown     Outpatient Encounter Medications as of 01/11/2018  Medication Sig  . acetaminophen (TYLENOL) 325 MG tablet Take 2 tablets (650 mg total) by mouth every 4 (four) hours as needed for mild pain (or temp > 37.5 C (99.5 F)).  Marland Kitchen allopurinol (ZYLOPRIM) 300 MG tablet TAKE 1 TABLET BY MOUTH DAILY  . atorvastatin (LIPITOR) 80 MG tablet TAKE 1 TABLET BY MOUTH DAILY  . clopidogrel (PLAVIX) 75 MG tablet Take 1 tablet (75 mg total) by mouth daily.  . Coenzyme Q10 (COQ10) 30 MG CAPS Take 1 tablet by mouth daily.    . diclofenac sodium (VOLTAREN) 1 % GEL Apply 2 g topically 4 (four) times daily.  . divalproex (DEPAKOTE ER) 500 MG 24 hr tablet Take 1 tablet (500 mg total) by mouth daily.  Marland Kitchen ezetimibe (ZETIA) 10 MG tablet Take 1 tablet (10 mg total) by mouth daily.  . furosemide (LASIX) 20 MG tablet TAKE 1 TABLET BY MOUTH DAILY (Patient taking differently: Pt taking M,W,F)  . Insulin Glargine (LANTUS SOLOSTAR) 100 UNIT/ML Solostar Pen Inject 10-20 Units into the skin daily at 10 pm. (Patient taking differently: Inject 10 Units into the skin daily at 10 pm. )  . linagliptin (TRADJENTA) 5 MG TABS tablet Take 1 tablet (5 mg total) by mouth daily. insuranc would not cover januvia  . losartan (COZAAR) 25 MG tablet Take 1 tablet (25 mg total) by mouth daily.  . metFORMIN (GLUCOPHAGE) 500 MG tablet TAKE 1 TABLET BY MOUTH TWICE DAILY WITH A MEAL  . metoprolol succinate (TOPROL-XL) 50 MG 24 hr tablet Take 1 tablet (50 mg total) by mouth daily. Take with or immediately following a meal.  . niacin (NIASPAN) 500 MG CR tablet Take 1 tablet (500 mg total) by mouth at bedtime.  . senna-docusate (SENOKOT-S) 8.6-50 MG tablet Take 1 tablet by mouth 2 (two) times daily. (Patient taking differently: Take 1 tablet by mouth as needed. )  . [DISCONTINUED] diclofenac sodium (VOLTAREN) 1 % GEL Apply 2 g topically 4 (four) times  daily.  . [DISCONTINUED] AMBULATORY NON FORMULARY MEDICATION Medication Name: left  lateral arm support/stabilizer (L)  . [DISCONTINUED] AMBULATORY NON FORMULARY MEDICATION Motorized wheelchair/ Fax to  [facility] [phone]  . [DISCONTINUED] ipratropium (ATROVENT) 0.03 % nasal spray USE 2 SPRAYS IN EACH NOSTRIL EVERY 12 HOURS  . [DISCONTINUED] Multiple Vitamin (MULTIVITAMIN WITH MINERALS) TABS tablet Take 1 tablet by mouth daily.   No facility-administered encounter medications on file as of 01/11/2018.          Objective:   Physical Exam  Constitutional: He is oriented to person, place, and time. He appears well-developed and well-nourished.  HENT:  Head: Normocephalic and atraumatic.  Right Ear: External ear normal.  Left Ear: External ear normal.  Nose: Nose normal.  Eyes: Conjunctivae are normal.  Neck: Normal range of motion.  Pulmonary/Chest: Effort normal.  Musculoskeletal: Normal range of motion.  Focal spine with normal flexion extension and rotation right and left.  Right shoulder with extension to about 175 degrees.  Some mild weakness with flexion extension against resistance.  Left elbow and wrist and fingers with normal range of motion.  Right hip, knee and ankle with normal range of motion.  Left shoulder with about 5 degrees of movement.  Is able to slightly curl his left hand but then is unable to extend it back.  Left hip knee and ankle without any significant range of motion he does have a brace on his left lower leg today.  Neurological: He is alert and oriented to person, place, and time. He has normal reflexes.  Skin: Skin is warm and dry.  Psychiatric: He has a normal mood and affect. His behavior is normal. Judgment and thought content normal.          Assessment & Plan:  Hemiparesis affecting the left side of his body as a late effect of cerebrovascular accident-I think he would strongly benefit from a power wheelchair.  No other assistive devices  such as a scooter walker or cane would be adequate as he is unable to stand without any assistance and he is unable to even transition without assistance.  Having a power wheelchair will allow him the ability to go from room to room, get food and cook in his kitchen and get to the bathroom to toilet.  I concur with the PT evaluation recommendations for power wheelchair.  He does have the mental capacity to operate a power wheelchair safely and willingness to use it in the home.  Time spent 40 min, rater than 50% of the time spent face-to-face discussing hemiparesis and evaluating for a power wheelchair.

## 2018-01-11 NOTE — Therapy (Signed)
[facility] [address] [place], [place], [address] Phone: [phone]   Fax:  [phone]  Physical Therapy Treatment  Patient Details  Name: Fred [name] MRN: [id number] Date of Birth: 07/23/1935 Referring Provider (PT): Dr. Letta Pate   Encounter Date: 01/11/2018  PT End of Session - 01/11/18 1255    Visit Number  7    Number of Visits  17    Date for PT Re-Evaluation  02/12/18    Authorization Type  Medicare, UHC, Tricare (no PTAs). Progress note every 10th visit.     PT Start Time  [phone]    PT Stop Time  1015    PT Time Calculation (min)  42 min    Equipment Utilized During Treatment  --   min guard to min A prn   Activity Tolerance  Patient tolerated treatment well    Behavior During Therapy  WFL for tasks assessed/performed       Past Medical History:  Diagnosis Date  . Atrial fibrillation ([place])    Post operative  . CAD (coronary artery disease)    s/p cabg  . CHF (congestive heart failure) ([place])   . Diverticulosis   . DJD (degenerative joint disease)   . DM (diabetes mellitus) ([place])   . History of anemia   . History of stroke   . Hyperlipidemia   . Hypertension   . 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 Peripheral vascular disease ([place])   . Polyneuropathy, diabetic ([place])   . Seizure disorder, complex partial ([place])   . Syncope and collapse     Past Surgical History:  Procedure Laterality Date  . CATARACT EXTRACTION W/ INTRAOCULAR LENS  IMPLANT, BILATERAL  06/2013   Dr. [name]  . CORONARY ARTERY BYPASS GRAFT  02/13/2004   4 vesel  . removal of sebaceous cyst from neck  08/2007   Dr. [name]  . TONSILLECTOMY      There were no vitals filed for this visit.  Subjective Assessment - 01/11/18 0935    Subjective  Pt denied falls or changes since last visit.     Patient is accompained by:  Family member   wife and grand dtr   Pertinent History  DM with  polyneuropathy, hx of cataracts, hx of CVA in 03/2017 and again 07/2017, OA in R knee, DJD, HTN, HLD, coronary atherosclerosis, a-fib, CHF, PVD, parotid lesion, diverticulitis, partial epilepsy, anemia    Patient Stated Goals  get stronger; able to walk and transfer better    Currently in Pain?  No/denies         [facility] PT Assessment - 01/11/18 0937      Balance   Balance Assessed  Yes      Static Standing Balance   Static Standing - Balance Support  No upper extremity supported    Static Standing - Level of Assistance  4: Min assist;Other (comment)   min guard to S   Static Standing - Comment/# of Minutes  Feet apart with intermittent RUE, and then no UE support for 5-10 sec. x5 reps.          Neuro re-ed and therex: Elza Rafter, PT10/22/2019 9:34 AM Signed Access Code: [email or web address]  URL: [email or web address]  Date: 01/02/2018  Prepared by: Geoffry Paradise   Exercises   Standing Weight Shift Side to Side - 10 reps - 1 sets - 1-2 hold - 1x daily - 7x weekly   Sit to Stand with Counter  Support - 10 reps - 1 sets - 1x daily - 7x weekly   Seated Knee Extension AROM - 5 reps - 2 sets - 1x daily - 5x weekly  (therex)  Seated Heel Slide - 5 reps - 2 sets - 1x daily - 5x weekly (therex)  Seated Hamstring Stretch with Chair - 3 reps - 1 sets - 30-45 hold - 1x daily - 7x weekly (therex)            [facility] Adult PT Treatment/Exercise - 01/11/18 1248      Transfers   Transfers  Sit to Stand;Stand to Sit    Sit to Stand  4: Min guard;4: Min assist    Sit to Stand Details (indicate cue type and reason)  Cues to use rocking momentum and RUE to perform STS txfs. Performed x3 reps and then after each seated rest break. Cues to improve ant. weight shifting.     Stand to Sit  4: Min guard;4: Min assist    Stand to Sit Details  Cues to improve eccentric control and to use RUE for support when reaching back to perform stand to sit txf.     Comments  See above  for details.           Balance Exercises - 01/11/18 1241      Balance Exercises: Standing   Standing Eyes Opened  Wide (BOA);Solid surface;5 reps    Other Standing Exercises  Pt performed with intermittent 1 UE support, with mirror in front of pt for feedback. Min guard to min A to maintain balance, with cues to improve weight shifting to midline, extend L knee, decr. R shoulder. Pt also performed balance HEP, please see pt instructions for details.         PT Education - 01/11/18 1252    Education Details  PT spent extensive time reviewing HEP with pt and pt's granddaughter (Mindy), as she will go to pt's house daily to assist with HEP. PT discussed the importance of pt taking rest breaks to ensure proper techinque and quality of movement. PT also educated pt's family on how to access HEP on [place] and re-printed HEP as pt reported he had not been performing often due to lack of assistance available at home. Pt's wife has dementia per pt's dtr.     Person(s) Educated  Patient;Spouse;Child(ren)    Methods  Explanation;Demonstration;Tactile cues;Verbal cues;Handout    Comprehension  Returned demonstration;Verbalized understanding;Need further instruction       PT Short Term Goals - 01/11/18 1258      PT SHORT TERM GOAL #1   Title  Pt will be IND in HEP to improve the deficits listed above. TARGET DATE FOR ALL STGS: 01/11/18    Status  Partially Met      PT SHORT TERM GOAL #2   Title  Pt will be able to standing without UE support and feet apart for 2 minutes, with S, to perform ADLs safely.     Status  Partially Met      PT SHORT TERM GOAL #3   Title  Perform TUG, gait speed, and BERG-write STGs/LTGs, once tolerated.     Status  New      PT SHORT TERM GOAL #4   Title  Pt will amb. 100' with LRAD, over even terrain, at MOD I level to improve functional mobility.     Status  New        PT Long Term Goals - 12/14/17 1229  PT LONG TERM GOAL #1   Title  Pt will  verbalize understanding of CVA risk factors and s/s of CVA to reduce risk of additional CVA. TARGET DATE FOR ALL LTGS: 02/08/18    Status  New      PT LONG TERM GOAL #2   Title  Pt will amb. 200' outdoors, with LRAD, with S, in order to walk in his yard safely.     Status  New      PT LONG TERM GOAL #3   Title  Trial manual w/c outdoors and write goal as indicated.     Status  New            Plan - 01/11/18 1256    Clinical Impression Statement  Pt demonstrated progress as he partially met STGs 1 and 2. PT will finish assessing remaining STGs next session. Today's skilled session focused on reviewing pt's HEP to ensure proper technique and to teach pt's granddaughter how to assist pt at home with HEP. Pt demonstrated progress, as he was able to improve L knee ext during stance, weight shifting to midline and perform static standing balance for 5-10 sec. without UE support. Pt would continue to benefit from skilled PT to improve safety during functional mobility.     Rehab Potential  Good    Clinical Impairments Affecting Rehab Potential  left side weakness; poor left side motor control    PT Frequency  2x / week    PT Duration  8 weeks    PT Treatment/Interventions  ADLs/Self Care Home Management;Biofeedback;Canalith Repostioning;Electrical Stimulation;Therapeutic activities;Therapeutic exercise;Manual techniques;Vestibular;Wheelchair mobility training;Functional mobility training;Orthotic Fit/Training;Stair training;Gait training;Patient/family education;DME Instruction;Neuromuscular re-education;Balance training    PT Next Visit Plan  Check remaining STGs (3 and 4): CVA ed handout? cont to work on transfers (STS and stand pivot with RW) ;make sure he is getting weight shift correct;  scifit was good tool for recipriocal mvt and UE involvement; he wants to try to get all the way around the gym with (RW with L h/o)    Consulted and Agree with Plan of Care  Patient;Family member/caregiver     Family Member Consulted  son       Patient will benefit from skilled therapeutic intervention in order to improve the following deficits and impairments:  Abnormal gait, Decreased endurance, Decreased knowledge of use of DME, Decreased strength, Impaired UE functional use, Decreased balance, Decreased mobility, Decreased range of motion, Decreased coordination, Impaired flexibility, Postural dysfunction  Visit Diagnosis: Unsteadiness on feet  Muscle weakness (generalized)  Abnormal posture  Hemiplegia and hemiparesis following cerebral infarction affecting left non-dominant side [facility])     Problem List Patient Active Problem List   Diagnosis Date Noted  . 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 Acute blood loss anemia   . Chronic diastolic (congestive) heart failure ([place])   . Acute idiopathic gout of right knee   . Primary osteoarthritis of right knee   . Hemiparesis affecting left side as late effect of cerebrovascular accident (CVA) ([place])   . Diabetes mellitus type 2 in obese ([place])   . Small vessel disease ([place]) 08/11/2017  . Left hemiparesis ([place])   . Benign essential hypertension with delivery   . CVA (cerebral vascular accident) ([place]) 08/10/2017  . 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 of localized onset, not intractable, without status epilepticus ([place]) 06/20/2017  . Primary osteoarthritis of both knees 05/11/2017  . Stroke (cerebrum) ([place]) 03/21/2017  . Type 2  diabetes mellitus with complication, with long-term current use of insulin ([place]) 03/27/2015  . Atherosclerosis of native coronary artery of native heart without angina pectoris 03/27/2015  . Palpitations 11/04/2014  . S/P CABG (coronary artery bypass graft) 11/04/2014  . Atrial fibrillation, unspecified   . History of stroke 09/04/2014  . Obesity (BMI 30-39.9) 11/29/2013  . History of gout 05/16/2013  . Congestive heart failure  ([place]) 04/20/2009  . DYSPNEA 02/11/2009  . CARDIOVASCULAR FUNCTION STUDY, ABNORMAL 01/29/2009  . NECK PAIN 08/03/2007  . ANEMIA / OTHER 05/16/2007  . Coronary atherosclerosis 05/16/2007  . PAROXYSMAL ATRIAL FIBRILLATION 05/16/2007  . Subcortical infarction ([place]) 05/16/2007  . Hyperlipidemia 03/30/2007  . DEGENERATIVE JOINT DISEASE 03/30/2007  . PAROTID LESION, UNSPECIFIED 09/29/2006  . Partial epilepsy with impairment of consciousness ([place]) 09/29/2006  . Diabetic polyneuropathy ([place]) 09/29/2006  . Essential hypertension 09/29/2006  . PERIPHERAL VASCULAR DISEASE 09/29/2006  . DIVERTICULOSIS 09/29/2006    Salvadore Valvano L 01/11/2018, 12:59 PM  [facility] [address] [place], [place], [address] Phone: [phone]   Fax:  [phone]  Name: KERRI ASCHE MRN: [id number] Date of Birth: [birth year]  Geoffry Paradise, PT,DPT 01/11/18 1:00 PM Phone: [phone] Fax: [phone]

## 2018-01-11 NOTE — Patient Instructions (Signed)
Elza Rafter, PT10/22/2019 9:34 AM Signed Access Code: [email or web address]  URL: [email or web address]  Date: 01/02/2018  Prepared by: Geoffry Paradise   Exercises   Standing Weight Shift Side to Side - 10 reps - 1 sets - 1-2 hold - 1x daily - 7x weekly   Sit to Stand with Counter Support - 10 reps - 1 sets - 1x daily - 7x weekly   Seated Knee Extension AROM - 5 reps - 2 sets - 1x daily - 5x weekly   Seated Heel Slide - 5 reps - 2 sets - 1x daily - 5x weekly   Seated Hamstring Stretch with Chair - 3 reps - 1 sets - 30-45 hold - 1x daily - 7x weekly

## 2018-01-12 ENCOUNTER — Ambulatory Visit: Payer: Medicare Other | Admitting: Physical Medicine & Rehabilitation

## 2018-01-12 ENCOUNTER — Encounter: Payer: Self-pay | Admitting: Physical Medicine & Rehabilitation

## 2018-01-12 ENCOUNTER — Encounter: Payer: Medicare Other | Attending: Physical Medicine & Rehabilitation

## 2018-01-12 VITALS — BP 119/66 | HR 70 | Ht 63.0 in | Wt 163.0 lb

## 2018-01-12 DIAGNOSIS — I110 Hypertensive heart disease with heart failure: Secondary | ICD-10-CM | POA: Diagnosis not present

## 2018-01-12 DIAGNOSIS — I2510 Atherosclerotic heart disease of native coronary artery without angina pectoris: Secondary | ICD-10-CM | POA: Insufficient documentation

## 2018-01-12 DIAGNOSIS — Z951 Presence of aortocoronary bypass graft: Secondary | ICD-10-CM | POA: Diagnosis not present

## 2018-01-12 DIAGNOSIS — Z79899 Other long term (current) drug therapy: Secondary | ICD-10-CM | POA: Insufficient documentation

## 2018-01-12 DIAGNOSIS — E1142 Type 2 diabetes mellitus with diabetic polyneuropathy: Secondary | ICD-10-CM | POA: Diagnosis not present

## 2018-01-12 DIAGNOSIS — G40909 Epilepsy, unspecified, not intractable, without status epilepticus: Secondary | ICD-10-CM | POA: Diagnosis not present

## 2018-01-12 DIAGNOSIS — I69354 Hemiplegia and hemiparesis following cerebral infarction affecting left non-dominant side: Secondary | ICD-10-CM | POA: Diagnosis not present

## 2018-01-12 DIAGNOSIS — Z87891 Personal history of nicotine dependence: Secondary | ICD-10-CM | POA: Diagnosis not present

## 2018-01-12 DIAGNOSIS — Z794 Long term (current) use of insulin: Secondary | ICD-10-CM | POA: Diagnosis not present

## 2018-01-12 DIAGNOSIS — E1151 Type 2 diabetes mellitus with diabetic peripheral angiopathy without gangrene: Secondary | ICD-10-CM | POA: Diagnosis not present

## 2018-01-12 DIAGNOSIS — Z7982 Long term (current) use of aspirin: Secondary | ICD-10-CM | POA: Insufficient documentation

## 2018-01-12 DIAGNOSIS — I6523 Occlusion and stenosis of bilateral carotid arteries: Secondary | ICD-10-CM | POA: Insufficient documentation

## 2018-01-12 DIAGNOSIS — E785 Hyperlipidemia, unspecified: Secondary | ICD-10-CM | POA: Diagnosis not present

## 2018-01-12 DIAGNOSIS — I5032 Chronic diastolic (congestive) heart failure: Secondary | ICD-10-CM | POA: Diagnosis not present

## 2018-01-12 NOTE — Patient Instructions (Signed)

## 2018-01-12 NOTE — Progress Notes (Signed)
Botox Injection for spasticity using needle EMG guidance  Dilution: 50 Units/ml Indication: Severe spasticity which interferes with ADL,mobility and/or  hygiene and is unresponsive to medication management and other conservative care Informed consent was obtained after describing risks and benefits of the procedure with the patient. This includes bleeding, bruising, infection, excessive weakness, or medication side effects. A REMS form is on file and signed. Needle: 27g 1" needle electrode Number of units per muscle Left FDS 25 Left FDP 25 Left Pec 75 L FCR 25 L biceps 25 Left Brachiorad 25 All injections were done after obtaining appropriate EMG activity and after negative drawback for blood. The patient tolerated the procedure well. Post procedure instructions were given. A followup appointment was made.

## 2018-01-15 ENCOUNTER — Other Ambulatory Visit: Payer: Self-pay | Admitting: Family Medicine

## 2018-01-16 ENCOUNTER — Ambulatory Visit: Payer: Medicare Other | Attending: Physical Medicine & Rehabilitation | Admitting: Occupational Therapy

## 2018-01-16 ENCOUNTER — Encounter: Payer: Self-pay | Admitting: Occupational Therapy

## 2018-01-16 ENCOUNTER — Ambulatory Visit: Payer: Medicare Other | Admitting: Rehabilitation

## 2018-01-16 ENCOUNTER — Encounter: Payer: Self-pay | Admitting: Rehabilitation

## 2018-01-16 DIAGNOSIS — I69354 Hemiplegia and hemiparesis following cerebral infarction affecting left non-dominant side: Secondary | ICD-10-CM | POA: Insufficient documentation

## 2018-01-16 DIAGNOSIS — M25512 Pain in left shoulder: Secondary | ICD-10-CM | POA: Diagnosis not present

## 2018-01-16 DIAGNOSIS — R293 Abnormal posture: Secondary | ICD-10-CM | POA: Insufficient documentation

## 2018-01-16 DIAGNOSIS — M25632 Stiffness of left wrist, not elsewhere classified: Secondary | ICD-10-CM | POA: Insufficient documentation

## 2018-01-16 DIAGNOSIS — G8929 Other chronic pain: Secondary | ICD-10-CM | POA: Diagnosis not present

## 2018-01-16 DIAGNOSIS — R2689 Other abnormalities of gait and mobility: Secondary | ICD-10-CM

## 2018-01-16 DIAGNOSIS — M6281 Muscle weakness (generalized): Secondary | ICD-10-CM | POA: Diagnosis not present

## 2018-01-16 DIAGNOSIS — R2681 Unsteadiness on feet: Secondary | ICD-10-CM | POA: Diagnosis not present

## 2018-01-16 DIAGNOSIS — M25532 Pain in left wrist: Secondary | ICD-10-CM | POA: Diagnosis not present

## 2018-01-16 DIAGNOSIS — M25612 Stiffness of left shoulder, not elsewhere classified: Secondary | ICD-10-CM | POA: Diagnosis not present

## 2018-01-16 NOTE — Therapy (Signed)
[facility] [address] [place], [place], [address] Phone: [phone]   Fax:  [phone]  Physical Therapy Treatment  Patient Details  Name: Fred [name] MRN: [id number] Date of Birth: 1935/08/05 Referring Provider (PT): Dr. Letta Pate   Encounter Date: 01/16/2018  PT End of Session - 01/16/18 1439    Visit Number  8    Number of Visits  17    Date for PT Re-Evaluation  02/12/18    Authorization Type  Medicare, UHC, Tricare (no PTAs). Progress note every 10th visit.     PT Start Time  0932    PT Stop Time  1015    PT Time Calculation (min)  43 min    Equipment Utilized During Treatment  --   min guard to min A prn   Activity Tolerance  Patient tolerated treatment well    Behavior During Therapy  WFL for tasks assessed/performed       Past Medical History:  Diagnosis Date  . Atrial fibrillation ([place])    Post operative  . CAD (coronary artery disease)    s/p cabg  . CHF (congestive heart failure) ([place])   . Diverticulosis   . DJD (degenerative joint disease)   . DM (diabetes mellitus) ([place])   . History of anemia   . History of stroke   . Hyperlipidemia   . Hypertension   . 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 Peripheral vascular disease ([place])   . Polyneuropathy, diabetic ([place])   . Seizure disorder, complex partial ([place])   . Syncope and collapse     Past Surgical History:  Procedure Laterality Date  . CATARACT EXTRACTION W/ INTRAOCULAR LENS  IMPLANT, BILATERAL  06/2013   Dr. [name]  . CORONARY ARTERY BYPASS GRAFT  02/13/2004   4 vesel  . removal of sebaceous cyst from neck  08/2007   Dr. [name]  . TONSILLECTOMY      There were no vitals filed for this visit.  Subjective Assessment - 01/16/18 0937    Subjective  Pt reports no changes since last session. Does have knee pain during session and reports he has knee brace but did not bring.  Advised to bring to future  sessions.     Pertinent History  DM with polyneuropathy, hx of cataracts, hx of CVA in 03/2017 and again 07/2017, OA in R knee, DJD, HTN, HLD, coronary atherosclerosis, a-fib, CHF, PVD, parotid lesion, diverticulitis, partial epilepsy, anemia    Patient Stated Goals  get stronger; able to walk and transfer better    Currently in Pain?  No/denies                       [facility] Adult PT Treatment/Exercise - 01/16/18 0001      Transfers   Transfers  Sit to Stand;Stand to Sit;Stand Pivot Transfers    Sit to Stand  4: Min guard;4: Min assist    Sit to Stand Details  Tactile cues for placement;Verbal cues for technique;Verbal cues for sequencing;Verbal cues for safe use of DME/AE;Manual facilitation for weight shifting    Sit to Stand Details (indicate cue type and reason)  Pt needing min cues to scoot forward before transfer, but does well with L foot placement.  Provided verbal cues from w/c for forward trunk lean and weight shift, but did provide manual facilitation during session from mat for improved forward weight shift.      Stand to Sit  4: Min guard;4: Min assist    Stand to Sit Details (indicate cue type and reason)  Verbal cues for sequencing;Verbal cues for technique;Manual facilitation for weight shifting    Stand to Sit Details  Cues for increased forward trunk lean to improve controlled descent to mat.     Stand Pivot Transfers  4: Min assist    Stand Pivot Transfer Details (indicate cue type and reason)  Provided verbal and light tactile cues for adequate weight shifting during transfer.  Attempted to provide less assist in order to decrease pusher tendencies.  Pt did better with weight shift, however had difficulty with advancing LLE into abd.       Ambulation/Gait   Ambulation/Gait  Yes    Ambulation/Gait Assistance  4: Min assist;3: Mod assist    Ambulation/Gait Assistance Details  Had pt ambulate short distance with RW, AFO and L hand orthosis during session towards  mirror for improved visual feedback on posture and weight shift.  Pt requires assist to manage RW as he tends to push too far forward, verbal and tactile facilitation for upright posture and improved R lateral weight shift during L swing phase.  Note that PT has to assist with clearing LLE however he is able to demo L knee extension to then advance RLE.  Also note during gait that he seems to have to vault over LLE and immediately has to flex LLE to land RLE.  Question whether there may be leg length descrepancy.  Discussed with OT who is to assess during their session.      Ambulation Distance (Feet)  25 Feet    Assistive device  Rolling walker   L AFO, L hand orthosis   Gait Pattern  Step-to pattern;Decreased weight shift to left;Poor foot clearance - left    Ambulation Surface  Level;Indoor      Neuro Re-ed    Neuro Re-ed Details   Worked in standing initially to gather whether pt could self correct weight shift to the L with improved R lateral weight shift.  He is able to correct somewhat, however needs constant cues and assist to maintain, therfore provided pt with mirror for improved visual feedback on posture and alignment and had pt reaching to the R to retrieve clothes pins and placing at midline to low stool as to work in mid range and improve activation of LLE.  Pt demos great ability to shift to the R with improved activation in LLE both with R lateral weight shift and with semi squat to stand position.  Performed x 10 reps each direction.  Pt tolerated well.  In efforts to relax LUE and improve LUE/LE weight bearing prior to gait, had pt place L hand on PTs thigh to promote WB while PT assisted with maintaining L elbow extension, L wrist extension (as able to tolerate) and proper positioning of L shoulder.  Had pt perform forward and L lateral weight shift to increase WB through LUE.  Pt tolerated well, therefore progressed to trying to have pt "push" through LUE and LLE to lift buttocks from  mat.  PT provided assist at LUE as mentioned but also at L LE to assist with elevation from mat, however pt unable to fully elevate but was able to initiate movement during task.                PT Short Term Goals - 01/11/18 1258      PT SHORT TERM GOAL #1   Title  Pt  will be IND in HEP to improve the deficits listed above. TARGET DATE FOR ALL STGS: 01/11/18    Status  Partially Met      PT SHORT TERM GOAL #2   Title  Pt will be able to standing without UE support and feet apart for 2 minutes, with S, to perform ADLs safely.     Status  Partially Met      PT SHORT TERM GOAL #3   Title  Perform TUG, gait speed, and BERG-write STGs/LTGs, once tolerated.     Status  New      PT SHORT TERM GOAL #4   Title  Pt will amb. 100' with LRAD, over even terrain, at MOD I level to improve functional mobility.     Status  New        PT Long Term Goals - 12/14/17 1229      PT LONG TERM GOAL #1   Title  Pt will verbalize understanding of CVA risk factors and s/s of CVA to reduce risk of additional CVA. TARGET DATE FOR ALL LTGS: 02/08/18    Status  New      PT LONG TERM GOAL #2   Title  Pt will amb. 200' outdoors, with LRAD, with S, in order to walk in his yard safely.     Status  New      PT LONG TERM GOAL #3   Title  Trial manual w/c outdoors and write goal as indicated.     Status  New            Plan - 01/16/18 1440    Clinical Impression Statement  Skilled session focused on decreasing pusher tendencies, improving postural control and alignment with increased LUE/LE activation along with gait with RW and L hand orthosis.  Pt tolerated well, however feel that there may be a leg length descrepancy with LLE longer than RLE, will continue to assess and address as needed.     Rehab Potential  Good    Clinical Impairments Affecting Rehab Potential  left side weakness; poor left side motor control    PT Frequency  2x / week    PT Duration  8 weeks    PT Treatment/Interventions   ADLs/Self Care Home Management;Biofeedback;Canalith Repostioning;Electrical Stimulation;Therapeutic activities;Therapeutic exercise;Manual techniques;Vestibular;Wheelchair mobility training;Functional mobility training;Orthotic Fit/Training;Stair training;Gait training;Patient/family education;DME Instruction;Neuromuscular re-education;Balance training    PT Next Visit Plan  Check remaining STGs (3 and 4): CVA ed handout? look at leg length again and add lift as able, cont to work on transfers (STS and stand pivot with RW) ;make sure he is getting weight shift correct;  scifit was good tool for recipriocal mvt and UE involvement; he wants to try to get all the way around the gym with (RW with L h/o)    Consulted and Agree with Plan of Care  Patient;Family member/caregiver    Family Member Consulted  son       Patient will benefit from skilled therapeutic intervention in order to improve the following deficits and impairments:  Abnormal gait, Decreased endurance, Decreased knowledge of use of DME, Decreased strength, Impaired UE functional use, Decreased balance, Decreased mobility, Decreased range of motion, Decreased coordination, Impaired flexibility, Postural dysfunction  Visit Diagnosis: Unsteadiness on feet  Muscle weakness (generalized)  Abnormal posture  Spastic hemiplegia of left nondominant side as late effect of cerebral infarction ([place])  Other abnormalities of gait and mobility     Problem List Patient Active Problem List   Diagnosis Date  Noted  . 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 Acute blood loss anemia   . Chronic diastolic (congestive) heart failure ([place])   . Acute idiopathic gout of right knee   . Primary osteoarthritis of right knee   . Hemiparesis affecting left side as late effect of cerebrovascular accident ([place])   . Diabetes mellitus type 2 in obese ([place])   . Small vessel disease ([place]) 08/11/2017  . Left hemiparesis  ([place])   . Benign essential hypertension with delivery   . CVA (cerebral vascular accident) ([place]) 08/10/2017  . 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 of localized onset, not intractable, without status epilepticus ([place]) 06/20/2017  . Primary osteoarthritis of both knees 05/11/2017  . Stroke (cerebrum) ([place]) 03/21/2017  . Type 2 diabetes mellitus with complication, with long-term current use of insulin ([place]) 03/27/2015  . Atherosclerosis of native coronary artery of native heart without angina pectoris 03/27/2015  . Palpitations 11/04/2014  . S/P CABG (coronary artery bypass graft) 11/04/2014  . Atrial fibrillation, unspecified   . History of stroke 09/04/2014  . Obesity (BMI 30-39.9) 11/29/2013  . History of gout 05/16/2013  . Congestive heart failure ([place]) 04/20/2009  . DYSPNEA 02/11/2009  . CARDIOVASCULAR FUNCTION STUDY, ABNORMAL 01/29/2009  . NECK PAIN 08/03/2007  . ANEMIA / OTHER 05/16/2007  . Coronary atherosclerosis 05/16/2007  . PAROXYSMAL ATRIAL FIBRILLATION 05/16/2007  . Subcortical infarction ([place]) 05/16/2007  . Hyperlipidemia 03/30/2007  . DEGENERATIVE JOINT DISEASE 03/30/2007  . PAROTID LESION, UNSPECIFIED 09/29/2006  . Partial epilepsy with impairment of consciousness ([place]) 09/29/2006  . Diabetic polyneuropathy ([place]) 09/29/2006  . Essential hypertension 09/29/2006  . PERIPHERAL VASCULAR DISEASE 09/29/2006  . DIVERTICULOSIS 09/29/2006   Cameron Sprang, PT, MPT [facility] [address] [place], [place], [address] Phone: [phone]   Fax:  [phone] 01/16/18, 2:47 PM  Name: Fred [name] MRN: [id number] Date of Birth: Jan 11, 1936

## 2018-01-16 NOTE — Therapy (Signed)
[facility] [address] [place], [place], [address] Phone: [phone]   Fax:  [phone]  Occupational Therapy Treatment  Patient Details  Name: Fred [name] MRN: [id number] Date of Birth: 03-08-1936 Referring Provider (OT): Dr. Alysia Penna   Encounter Date: 01/16/2018  OT End of Session - 01/16/18 1312    Visit Number  9    Number of Visits  24    Date for OT Re-Evaluation  03/08/18    Authorization Type  Medicare, Tricare, UHC - pt will need PN every 10th visit    Authorization Time Period  90 days    Authorization - Visit Number  9    Authorization - Number of Visits  10    OT Start Time  1018    OT Stop Time  1100    OT Time Calculation (min)  42 min       Past Medical History:  Diagnosis Date  . Atrial fibrillation ([place])    Post operative  . CAD (coronary artery disease)    s/p cabg  . CHF (congestive heart failure) ([place])   . Diverticulosis   . DJD (degenerative joint disease)   . DM (diabetes mellitus) ([place])   . History of anemia   . History of stroke   . Hyperlipidemia   . Hypertension   . 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 Peripheral vascular disease ([place])   . Polyneuropathy, diabetic ([place])   . Seizure disorder, complex partial ([place])   . Syncope and collapse     Past Surgical History:  Procedure Laterality Date  . CATARACT EXTRACTION W/ INTRAOCULAR LENS  IMPLANT, BILATERAL  06/2013   Dr. [name]  . CORONARY ARTERY BYPASS GRAFT  02/13/2004   4 vesel  . removal of sebaceous cyst from neck  08/2007   Dr. [name]  . TONSILLECTOMY      There were no vitals filed for this visit.  Subjective Assessment - 01/16/18 1022    Subjective   I will bring the brace for my right knee next time.     Patient is accompained by:  Family member   wife and son in law   Pertinent History  [name] 08/09/2017. DIscharged home on 09/22/2017 after inpt rehab stay. Pt had HH PT  and OT. PMH:      Patient Stated Goals  I want to get walking again.  I am an outdoor guy                   OT Treatments/Exercises (OP) - 01/16/18 0001      Neurological Re-education Exercises   Other Exercises 1  Neuro re ed to address squat pivot transfers to both sides with emphasis on midlne , leaning forward and pushing with LE's for transfer.  Also addressed LUE neuro re ed in sidelying first with emphasis on scapular pro and retraction and then with mid reach in closed chain on moveable surface - initially pt was not able to initiate any movement with LUE however with repeition and opportunity pt eventually able to recruit to assist with movement.  Pt had botox injection to LUE last week and states "it is already looser."  Also addressed bridging and bed mobility.                OT Short Term Goals - 12/18/17 1530      OT SHORT TERM GOAL #1   Title  Pt and  family will be mod I with basic home exercise/home activities program for LUE, balance and functional mobility - 01/25/2018    Status  On-going      OT SHORT TERM GOAL #2   Title  Pt will be supervision with UB bathing    Status  On-going      OT SHORT TERM GOAL #3   Title  Pt will be min a for LB bathing    Status  On-going      OT SHORT TERM GOAL #4   Title  Pt will be close supervision for toilet transfers    Status  On-going      OT SHORT TERM GOAL #5   Title  Pt will be min a for tub bench transfers (assist with LLE)    Status  On-going      OT SHORT TERM GOAL #6   Title  Pt will tolerate shoulder flexion to 110* with no pain in supine in prep for HEP and low functional reach    Status  On-going      OT SHORT TERM GOAL #7   Title  Pt will demonstrate shoulder flexion to at least 30* in prep for low reach    Status  On-going      OT SHORT TERM GOAL #8   Title  Pt will demonstrate ability to hold light objects in L hand while manipulating with R hand (i.e opening containers) with min a     Status  On-going      OT SHORT TERM GOAL  #9   TITLE  Pt will be mod a for LB dressing    Status  On-going      OT SHORT TERM GOAL  #10   TITLE  Pt/family will be mod I with splint wear and care, prn    Status  On-going        OT Long Term Goals - 12/18/17 1633      OT LONG TERM GOAL #1   Title  Pt and family will be mod I with upgraded HEP/home activities program - 03/08/2018    Status  New      OT LONG TERM GOAL #2   Title  Pt will be min a for LB dressing    Status  New      OT LONG TERM GOAL #3   Title  Pt will be mod I with toilet transfers    Status  New      OT LONG TERM GOAL #4   Title  Pt will be mod I with clothing manipulation with toileting activities    Status  New      OT LONG TERM GOAL #5   Title  Pt will be min a for simple familiar hot meal prep    Status  New      OT LONG TERM GOAL #6   Title  Pt will demonstate ability to use LUE as stabilzer at least 25% of the time during basic self care and fishing activities.    Status  New      OT LONG TERM GOAL #7   Title  Pt will be mod I with casting simple fishing pole    Status  New      OT LONG TERM GOAL #8   Title  Pt will demonstate adequate postural alignment and control to complete simple activities in standing without UE support.     Status  New  Plan - 01/16/18 1312    Clinical Impression Statement  Pt continues to make slow but steady progress in basic mobility and balance.     Occupational Profile and client history currently impacting functional performance  PMH: husband, father, grand father, retiree.  Pt loves the outdoors and fishing.  PMH: CAD, S/P CABG, DM, HTN, HLD, AFIB, CHF, seizure, PVD, obesity, ETHO abuse, TIA in 03/2017.      Occupational performance deficits (Please refer to evaluation for details):  ADL's;IADL's;Leisure;Social Participation;Rest and Sleep    Rehab Potential  Good    Current Impairments/barriers affecting progress:  medical history, severity of  motor deficits    OT Frequency  2x / week    OT Duration  12 weeks    OT Treatment/Interventions  Self-care/ADL training;Aquatic Therapy;Electrical Stimulation;Moist Heat;Fluidtherapy;Ultrasound;Therapeutic exercise;Neuromuscular education;Energy conservation;DME and/or AE instruction;Passive range of motion;Manual Therapy;Functional Mobility Training;Splinting;Therapeutic activities;Patient/family education;Balance training    Plan  check LB dressing and address futher education PRN, manual therapy to address LUE shouler girdle alignment, NMR for LUE, balance, functional mobility, ADL's,     Consulted and Agree with Plan of Care  Patient;Family member/caregiver    Family Member Consulted  wife and son in law       Patient will benefit from skilled therapeutic intervention in order to improve the following deficits and impairments:  Abnormal gait, Decreased balance, Decreased range of motion, Decreased mobility, Decreased knowledge of use of DME, Decreased strength, Difficulty walking, Impaired UE functional use, Impaired tone, Impaired flexibility, Pain  Visit Diagnosis: Unsteadiness on feet  Muscle weakness (generalized)  Abnormal posture  Spastic hemiplegia of left nondominant side as late effect of cerebral infarction (HCC)  Chronic left shoulder pain  Pain in left wrist  Stiffness of left shoulder, not elsewhere classified  Stiffness of left wrist, not elsewhere classified    Problem List Patient Active Problem List   Diagnosis Date Noted  . 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 Acute blood loss anemia   . Chronic diastolic (congestive) heart failure ([place])   . Acute idiopathic gout of right knee   . Primary osteoarthritis of right knee   . Hemiparesis affecting left side as late effect of cerebrovascular accident ([place])   . Diabetes mellitus type 2 in obese ([place])   . Small vessel disease ([place]) 08/11/2017  . Left hemiparesis ([place])    . Benign essential hypertension with delivery   . CVA (cerebral vascular accident) ([place]) 08/10/2017  . 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 of localized onset, not intractable, without status epilepticus ([name]) 06/20/2017  . Primary osteoarthritis of both knees 05/11/2017  . Stroke (cerebrum) ([place]) 03/21/2017  . Type 2 diabetes mellitus with complication, with long-term current use of insulin ([place]) 03/27/2015  . Atherosclerosis of native coronary artery of native heart without angina pectoris 03/27/2015  . Palpitations 11/04/2014  . S/P CABG (coronary artery bypass graft) 11/04/2014  . Atrial fibrillation, unspecified   . History of stroke 09/04/2014  . Obesity (BMI 30-39.9) 11/29/2013  . History of gout 05/16/2013  . Congestive heart failure ([place]) 04/20/2009  . DYSPNEA 02/11/2009  . CARDIOVASCULAR FUNCTION STUDY, ABNORMAL 01/29/2009  . NECK PAIN 08/03/2007  . ANEMIA / OTHER 05/16/2007  . Coronary atherosclerosis 05/16/2007  . PAROXYSMAL ATRIAL FIBRILLATION 05/16/2007  . Subcortical infarction ([place]) 05/16/2007  . Hyperlipidemia 03/30/2007  . DEGENERATIVE JOINT DISEASE 03/30/2007  . PAROTID LESION, UNSPECIFIED 09/29/2006  . Partial epilepsy with impairment of consciousness ([place]) 09/29/2006  . Diabetic  polyneuropathy ([place]) 09/29/2006  . Essential hypertension 09/29/2006  . PERIPHERAL VASCULAR DISEASE 09/29/2006  . DIVERTICULOSIS 09/29/2006    Quay Burow, OTR/L 01/16/2018, 1:14 PM  [facility] [address] [place], [place], [address] Phone: [phone]   Fax:  [phone]  Name: Fred [name] MRN: [id number] Date of Birth: 09/15/1935

## 2018-01-18 ENCOUNTER — Encounter: Payer: Self-pay | Admitting: Rehabilitation

## 2018-01-18 ENCOUNTER — Ambulatory Visit: Payer: Medicare Other | Admitting: Rehabilitation

## 2018-01-18 ENCOUNTER — Encounter: Payer: Self-pay | Admitting: Occupational Therapy

## 2018-01-18 ENCOUNTER — Ambulatory Visit: Payer: Medicare Other | Admitting: Occupational Therapy

## 2018-01-18 DIAGNOSIS — M6281 Muscle weakness (generalized): Secondary | ICD-10-CM | POA: Diagnosis not present

## 2018-01-18 DIAGNOSIS — R2689 Other abnormalities of gait and mobility: Secondary | ICD-10-CM

## 2018-01-18 DIAGNOSIS — I69354 Hemiplegia and hemiparesis following cerebral infarction affecting left non-dominant side: Secondary | ICD-10-CM

## 2018-01-18 DIAGNOSIS — G8929 Other chronic pain: Secondary | ICD-10-CM | POA: Diagnosis not present

## 2018-01-18 DIAGNOSIS — M25612 Stiffness of left shoulder, not elsewhere classified: Secondary | ICD-10-CM

## 2018-01-18 DIAGNOSIS — M25512 Pain in left shoulder: Secondary | ICD-10-CM

## 2018-01-18 DIAGNOSIS — R2681 Unsteadiness on feet: Secondary | ICD-10-CM

## 2018-01-18 DIAGNOSIS — R293 Abnormal posture: Secondary | ICD-10-CM | POA: Diagnosis not present

## 2018-01-18 DIAGNOSIS — M25632 Stiffness of left wrist, not elsewhere classified: Secondary | ICD-10-CM

## 2018-01-18 DIAGNOSIS — M25532 Pain in left wrist: Secondary | ICD-10-CM

## 2018-01-18 NOTE — Therapy (Signed)
[facility] [address] [place], [place], [address] Phone: [phone]   Fax:  [phone]  Physical Therapy Treatment  Patient Details  Name: SLATE DEBROUX MRN: [id number] Date of Birth: [birth year] Referring Provider (PT): Dr. Letta Pate   Encounter Date: 01/18/2018  PT End of Session - 01/18/18 1222    Visit Number  9    Number of Visits  17    Date for PT Re-Evaluation  02/12/18    Authorization Type  Medicare, UHC, Tricare (no PTAs). Progress note every 10th visit.     PT Start Time  0932    PT Stop Time  1016    PT Time Calculation (min)  44 min    Equipment Utilized During Treatment  --   min guard to min A prn   Activity Tolerance  Patient tolerated treatment well    Behavior During Therapy  WFL for tasks assessed/performed       Past Medical History:  Diagnosis Date  . Atrial fibrillation ([place])    Post operative  . CAD (coronary artery disease)    s/p cabg  . CHF (congestive heart failure) ([place])   . Diverticulosis   . DJD (degenerative joint disease)   . DM (diabetes mellitus) ([place])   . History of anemia   . History of stroke   . Hyperlipidemia   . Hypertension   . 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 Peripheral vascular disease ([place])   . Polyneuropathy, diabetic ([place])   . Seizure disorder, complex partial ([place])   . Syncope and collapse     Past Surgical History:  Procedure Laterality Date  . CATARACT EXTRACTION W/ INTRAOCULAR LENS  IMPLANT, BILATERAL  06/2013   Dr. [name]  . CORONARY ARTERY BYPASS GRAFT  02/13/2004   4 vesel  . removal of sebaceous cyst from neck  08/2007   Dr. [name]  . TONSILLECTOMY      There were no vitals filed for this visit.  Subjective Assessment - 01/18/18 0936    Subjective  Pt continues to have R knee pain when walking and when sitting.  Did have R knee brace on today, but reports he doesn't feel like it helps a lot.     Pertinent History  DM with polyneuropathy, hx of cataracts, hx of CVA in 03/2017 and again 07/2017, OA in R knee, DJD, HTN, HLD, coronary atherosclerosis, a-fib, CHF, PVD, parotid lesion, diverticulitis, partial epilepsy, anemia    Patient Stated Goals  get stronger; able to walk and transfer better    Currently in Pain?  Yes    Pain Score  7     Pain Location  Knee    Pain Orientation  Right    Pain Descriptors / Indicators  Aching    Pain Onset  More than a month ago    Pain Frequency  Intermittent    Aggravating Factors   when sitting, walking    Pain Relieving Factors  resting              Gait training:  Due to OT noting 3/4" leg length discrepancy at last session, PT donned 43m shoe lift on R shoe (already has small shoe insert added to R shoe) and added blue simulated toe cap for even more assist for clearance of LLE.  Pt ambulated x 30' with min A with VERY light tactile cues at trunk to maintain upright posture throughout, especially when advancing LLE and also  tactile cues at L pelvis with forward weight shift over LLE in stance phase of gait.  Pt noted to have marked improvement in ability to clear LLE, just note that he tends to land in ER, which PT had to assist to correct, however feel that this is most likely due to hip weakness.  Pt also reports that gait felt better with added shoe lift.  Will pursue this with [facility] as they did his AFO in hospital.    NMR:  Attempted to have pt ambulate without AD, but facing PT (with LUE on PTs forearm and RUE on PTs shoulder) to decrease R shoulder elevation and allow more active trunk mobility during gait.  However, pt only able to take a few steps and required mod to max A to prevent LOB to the L.  Therefore, had pt only work on R lateral weight shift with LLE advancement and retro stepping which he was able to do very well, again with assist for proper foot placement.  Note that he continues to need heavy cues for R lateral weight  shift, therefore transitioned to having pt stand beside wall (on R side) with entire forearm against wall and cues to keep this placement while then continuing to step and retro step LLE.  Pt did well in this position with cues for lifting chest for more upright posture.    TA:  Ended session with 2 functional transfers R and L with pt standing to transfer to the L and needing mod A, esp following first step due to increased L lateral weight shift.  During second rep, performed squat pivot transfer which he was able to do with min A with light facilitation for increased forward weight shift and trunk lean.                   PT Education - 01/18/18 1221    Education Details  Education regarding contacting sports med MD that previously did his knee injections about getting another injection in R knee (has been 6 months).  Also educated on having Chris from [facility] come to a future session to assess leg length.     Person(s) Educated  Patient;Spouse;Child(ren)    Methods  Explanation;Demonstration    Comprehension  Verbalized understanding       PT Short Term Goals - 01/11/18 1258      PT SHORT TERM GOAL #1   Title  Pt will be IND in HEP to improve the deficits listed above. TARGET DATE FOR ALL STGS: 01/11/18    Status  Partially Met      PT SHORT TERM GOAL #2   Title  Pt will be able to standing without UE support and feet apart for 2 minutes, with S, to perform ADLs safely.     Status  Partially Met      PT SHORT TERM GOAL #3   Title  Perform TUG, gait speed, and BERG-write STGs/LTGs, once tolerated.     Status  New      PT SHORT TERM GOAL #4   Title  Pt will amb. 100' with LRAD, over even terrain, at MOD I level to improve functional mobility.     Status  New        PT Long Term Goals - 12/14/17 1229      PT LONG TERM GOAL #1   Title  Pt will verbalize understanding of CVA risk factors and s/s of CVA to reduce risk of additional CVA. TARGET  DATE FOR ALL LTGS:  02/08/18    Status  New      PT LONG TERM GOAL #2   Title  Pt will amb. 200' outdoors, with LRAD, with S, in order to walk in his yard safely.     Status  New      PT LONG TERM GOAL #3   Title  Trial manual w/c outdoors and write goal as indicated.     Status  New            Plan - 01/18/18 1222    Clinical Impression Statement  Skilled session focused on gait with use of RW, L hand orthosis, L AFO (with added blue simulated toe cap) and 21m lift added to R shoe to address quality of gait.  Also continue to work on standing weight shift to the R in order to move LLE more freely during gait.  Pt tolerated well.      Rehab Potential  Good    Clinical Impairments Affecting Rehab Potential  left side weakness; poor left side motor control    PT Frequency  2x / week    PT Duration  8 weeks    PT Treatment/Interventions  ADLs/Self Care Home Management;Biofeedback;Canalith Repostioning;Electrical Stimulation;Therapeutic activities;Therapeutic exercise;Manual techniques;Vestibular;Wheelchair mobility training;Functional mobility training;Orthotic Fit/Training;Stair training;Gait training;Patient/family education;DME Instruction;Neuromuscular re-education;Balance training    PT Next Visit Plan  Check remaining STGs (3 and 4): CVA ed handout? used 91mshoe lift on R, cont to work on transfers (STS and stand pivot with RW) ;make sure he is getting weight shift correct;  scifit was good tool for recipriocal mvt and UE involvement; he wants to try to get all the way around the gym with (RW with L h/o)    Recommended Other Services  EmRaquel Sarnao work on co[employer]ensions consultanto have ChRossmoyneome to an appt    Consulted and Agree with Plan of Care  Patient;Family member/caregiver    Family Member Consulted  son       Patient will benefit from skilled therapeutic intervention in order to improve the following deficits and impairments:  Abnormal gait, Decreased endurance, Decreased knowledge of use of DME,  Decreased strength, Impaired UE functional use, Decreased balance, Decreased mobility, Decreased range of motion, Decreased coordination, Impaired flexibility, Postural dysfunction  Visit Diagnosis: Other abnormalities of gait and mobility  Hemiplegia and hemiparesis following cerebral infarction affecting left non-dominant side (HCC)  Unsteadiness on feet     Problem List Patient Active Problem List   Diagnosis Date Noted  . 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 Acute blood loss anemia   . Chronic diastolic (congestive) heart failure ([facility]  . Acute idiopathic gout of right knee   . Primary osteoarthritis of right knee   . Hemiparesis affecting left side as late effect of cerebrovascular accident ([facility]  . Diabetes mellitus type 2 in obese ([facility]CH[place]  . Small vessel disease (HCChebanse05/31/2019  . Left hemiparesis ([facility]  . Benign essential hypertension with delivery   . CVA (cerebral vascular accident) (HCDe[facility]05/30/2019  . 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 of localized onset, not intractable, without status epilepticus (HCKim[facility]ly04/11/2017  . Primary osteoarthritis of both knees 05/11/2017  . Stroke (cerebrum) (HCPerry01/10/2017  . Type 2 diabetes mellitus with complication, with long-term current use of insulin (HCHurdsfield01/13/2017  . Atherosclerosis of native coronary artery of native heart without angina pectoris 03/27/2015  . Palpitations 11/04/2014  .  S/P CABG (coronary artery bypass graft) 11/04/2014  . Atrial fibrillation, unspecified   . History of stroke 09/04/2014  . Obesity (BMI 30-39.9) 11/29/2013  . History of gout 05/16/2013  . Congestive heart failure ([place]) 04/20/2009  . DYSPNEA 02/11/2009  . CARDIOVASCULAR FUNCTION STUDY, ABNORMAL 01/29/2009  . NECK PAIN 08/03/2007  . ANEMIA / OTHER 05/16/2007  . Coronary atherosclerosis 05/16/2007  . PAROXYSMAL ATRIAL FIBRILLATION 05/16/2007  .  Subcortical infarction ([place]) 05/16/2007  . Hyperlipidemia 03/30/2007  . DEGENERATIVE JOINT DISEASE 03/30/2007  . PAROTID LESION, UNSPECIFIED 09/29/2006  . Partial epilepsy with impairment of consciousness ([place]) 09/29/2006  . Diabetic polyneuropathy ([place]) 09/29/2006  . Essential hypertension 09/29/2006  . PERIPHERAL VASCULAR DISEASE 09/29/2006  . DIVERTICULOSIS 09/29/2006    Cameron Sprang, PT, MPT [facility] [address] [place], [place], [address] Phone: [phone]   Fax:  [phone] 01/18/18, 12:34 PM  Name: KRISHNA DANCEL MRN: [id number] Date of Birth: March 04, 1936

## 2018-01-18 NOTE — Therapy (Signed)
[facility] [address] [place], [place], [address] Phone: [phone]   Fax:  [phone]  Occupational Therapy Treatment  Patient Details  Name: Fred [name] MRN: [id number] Date of Birth: 01-28-1936 Referring Provider (OT): Dr. Alysia Penna   Encounter Date: 01/18/2018  OT End of Session - 01/18/18 1204    Visit Number  10    Number of Visits  24    Date for OT Re-Evaluation  03/08/18    Authorization Type  Medicare, Tricare, UHC - pt will need PN every 10th visit    Authorization Time Period  90 days    Authorization - Visit Number  10    Authorization - Number of Visits  20    OT Start Time  0845    OT Stop Time  0929    OT Time Calculation (min)  44 min    Activity Tolerance  Patient tolerated treatment well       Past Medical History:  Diagnosis Date  . Atrial fibrillation ([place])    Post operative  . CAD (coronary artery disease)    s/p cabg  . CHF (congestive heart failure) ([place])   . Diverticulosis   . DJD (degenerative joint disease)   . DM (diabetes mellitus) ([place])   . History of anemia   . History of stroke   . Hyperlipidemia   . Hypertension   . 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 Peripheral vascular disease ([place])   . Polyneuropathy, diabetic ([place])   . Seizure disorder, complex partial ([place])   . Syncope and collapse     Past Surgical History:  Procedure Laterality Date  . CATARACT EXTRACTION W/ INTRAOCULAR LENS  IMPLANT, BILATERAL  06/2013   Dr. [name]  . CORONARY ARTERY BYPASS GRAFT  02/13/2004   4 vesel  . removal of sebaceous cyst from neck  08/2007   Dr. [name]  . TONSILLECTOMY      There were no vitals filed for this visit.  Subjective Assessment - 01/18/18 0849    Subjective   I am looking forward to the pool    Patient is accompained by:  Family member   wife, son in law   Pertinent History  [name] 08/09/2017. DIscharged home on 09/22/2017  after inpt rehab stay. Pt had HH PT and OT. PMH:      Patient Stated Goals  I want to get walking again.  I am an outdoor guy    Currently in Pain?  No/denies                   OT Treatments/Exercises (OP) - 01/18/18 0001      Neurological Re-education Exercises   Other Exercises 1  Neuro re ed to address squat pivot transfers to the left with emphasis on increasing active use of LLE and finding midline with transfer.  Pt needed only light min a and vc's.  Also addressed active weight bearing with LUE progressing to body and arm movement togther  for forward lean in prep for standing as well as arm on body closed chain for beginning pattern of low reach.  Addressed sit to stand - pt able to complete after prep with light min a.  Addressed postural alignment and control for static standing as well as lateral weight shifting, stepping and then functional ambulation using RW and min a to assist with advancing the LLE.  Max cues for alignment and postural control .  Addressed controlled stand to sit as well.  Discussed pool and pt in agreement - will begin next week.                OT Short Term Goals - 01/18/18 1158      OT SHORT TERM GOAL #1   Title  Pt and family will be mod I with basic home exercise/home activities program for LUE, balance and functional mobility - 01/25/2018    Status  On-going      OT SHORT TERM GOAL #2   Title  Pt will be supervision with UB bathing    Status  On-going      OT SHORT TERM GOAL #3   Title  Pt will be min a for LB bathing    Status  On-going      OT SHORT TERM GOAL #4   Title  Pt will be close supervision for toilet transfers    Status  On-going      OT SHORT TERM GOAL #5   Title  Pt will be min a for tub bench transfers (assist with LLE)    Status  On-going      OT SHORT TERM GOAL #6   Title  Pt will tolerate shoulder flexion to 110* with no pain in supine in prep for HEP and low functional reach    Status  On-going      OT  SHORT TERM GOAL #7   Title  Pt will demonstrate shoulder flexion to at least 30* in prep for low reach    Status  On-going      OT SHORT TERM GOAL #8   Title  Pt will demonstrate ability to hold light objects in L hand while manipulating with R hand (i.e opening containers) with min a    Status  On-going      OT SHORT TERM GOAL  #9   TITLE  Pt will be mod a for LB dressing    Status  On-going      OT SHORT TERM GOAL  #10   TITLE  Pt/family will be mod I with splint wear and care, prn    Status  On-going        OT Long Term Goals - 01/18/18 1159      OT LONG TERM GOAL #1   Title  Pt and family will be mod I with upgraded HEP/home activities program - 03/08/2018    Status  New      OT LONG TERM GOAL #2   Title  Pt will be min a for LB dressing    Status  New      OT LONG TERM GOAL #3   Title  Pt will be mod I with toilet transfers    Status  New      OT LONG TERM GOAL #4   Title  Pt will be mod I with clothing manipulation with toileting activities    Status  New      OT LONG TERM GOAL #5   Title  Pt will be min a for simple familiar hot meal prep    Status  New      OT LONG TERM GOAL #6   Title  Pt will demonstate ability to use LUE as stabilzer at least 25% of the time during basic self care and fishing activities.    Status  New      OT LONG TERM GOAL #7   Title  Pt will be  mod I with casting simple fishing pole    Status  New      OT LONG TERM GOAL #8   Title  Pt will demonstate adequate postural alignment and control to complete simple activities in standing without UE support.     Status  New            Plan - 01/18/18 1204    Clinical Impression Statement  Pt progressing toward goals. Pt with improving mobility and transfers.     Occupational Profile and client history currently impacting functional performance  PMH: husband, father, grand father, retiree.  Pt loves the outdoors and fishing.  PMH: CAD, S/P CABG, DM, HTN, HLD, AFIB, CHF, seizure, PVD,  obesity, ETHO abuse, TIA in 03/2017.      Occupational performance deficits (Please refer to evaluation for details):  ADL's;IADL's;Leisure;Social Participation;Rest and Sleep    Rehab Potential  Good    Current Impairments/barriers affecting progress:  medical history, severity of motor deficits    OT Frequency  2x / week    OT Duration  12 weeks    OT Treatment/Interventions  Self-care/ADL training;Aquatic Therapy;Electrical Stimulation;Moist Heat;Fluidtherapy;Ultrasound;Therapeutic exercise;Neuromuscular education;Energy conservation;DME and/or AE instruction;Passive range of motion;Manual Therapy;Functional Mobility Training;Splinting;Therapeutic activities;Patient/family education;Balance training    Plan  check LB dressing and address futher education PRN, manual therapy to address LUE shouler girdle alignment, NMR for LUE, balance, functional mobility, ADL's,     Consulted and Agree with Plan of Care  Patient;Family member/caregiver    Family Member Consulted  wife and son in law       Patient will benefit from skilled therapeutic intervention in order to improve the following deficits and impairments:  Abnormal gait, Decreased balance, Decreased range of motion, Decreased mobility, Decreased knowledge of use of DME, Decreased strength, Difficulty walking, Impaired UE functional use, Impaired tone, Impaired flexibility, Pain  Visit Diagnosis: Unsteadiness on feet  Muscle weakness (generalized)  Abnormal posture  Spastic hemiplegia of left nondominant side as late effect of cerebral infarction (HCC)  Chronic left shoulder pain  Pain in left wrist  Stiffness of left shoulder, not elsewhere classified  Stiffness of left wrist, not elsewhere classified    Problem List Patient Active Problem List   Diagnosis Date Noted  . 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 Acute blood loss anemia   . Chronic diastolic (congestive) heart failure  ([place])   . Acute idiopathic gout of right knee   . Primary osteoarthritis of right knee   . Hemiparesis affecting left side as late effect of cerebrovascular accident ([place])   . Diabetes mellitus type 2 in obese ([place])   . Small vessel disease ([place]) 08/11/2017  . Left hemiparesis ([place])   . Benign essential hypertension with delivery   . CVA (cerebral vascular accident) ([place]) 08/10/2017  . 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 of localized onset, not intractable, without status epilepticus ([place]) 06/20/2017  . Primary osteoarthritis of both knees 05/11/2017  . Stroke (cerebrum) ([place]) 03/21/2017  . Type 2 diabetes mellitus with complication, with long-term current use of insulin ([place]) 03/27/2015  . Atherosclerosis of native coronary artery of native heart without angina pectoris 03/27/2015  . Palpitations 11/04/2014  . S/P CABG (coronary artery bypass graft) 11/04/2014  . Atrial fibrillation, unspecified   . History of stroke 09/04/2014  . Obesity (BMI 30-39.9) 11/29/2013  . History of gout 05/16/2013  . Congestive heart failure ([place]) 04/20/2009  . DYSPNEA 02/11/2009  . CARDIOVASCULAR FUNCTION STUDY,  ABNORMAL 01/29/2009  . NECK PAIN 08/03/2007  . ANEMIA / OTHER 05/16/2007  . Coronary atherosclerosis 05/16/2007  . PAROXYSMAL ATRIAL FIBRILLATION 05/16/2007  . Subcortical infarction ([place]) 05/16/2007  . Hyperlipidemia 03/30/2007  . DEGENERATIVE JOINT DISEASE 03/30/2007  . PAROTID LESION, UNSPECIFIED 09/29/2006  . Partial epilepsy with impairment of consciousness ([place]) 09/29/2006  . Diabetic polyneuropathy ([place]) 09/29/2006  . Essential hypertension 09/29/2006  . PERIPHERAL VASCULAR DISEASE 09/29/2006  . DIVERTICULOSIS 09/29/2006   Occupational Therapy Progress Note  Dates of Reporting Period: 12/14/2017 to 01/18/2018  Objective Reports of Subjective Statement: see above  Objective Measurements: see above  Goal Update: see above  Plan: see  above  Reason Skilled Services are Required: see above  Quay Burow, OTR/L 01/18/2018, 12:07 PM  [facility] [address] [place], [place], [address] Phone: [phone]   Fax:  [phone]  Name: Fred [name] MRN: [id number] Date of Birth: [birth year]

## 2018-01-23 ENCOUNTER — Ambulatory Visit: Payer: Medicare Other | Admitting: Occupational Therapy

## 2018-01-23 ENCOUNTER — Ambulatory Visit: Payer: Medicare Other

## 2018-01-23 ENCOUNTER — Encounter: Payer: Self-pay | Admitting: Occupational Therapy

## 2018-01-23 ENCOUNTER — Encounter: Payer: Medicare Other | Admitting: Occupational Therapy

## 2018-01-23 DIAGNOSIS — M25512 Pain in left shoulder: Secondary | ICD-10-CM | POA: Diagnosis not present

## 2018-01-23 DIAGNOSIS — I69354 Hemiplegia and hemiparesis following cerebral infarction affecting left non-dominant side: Secondary | ICD-10-CM | POA: Diagnosis not present

## 2018-01-23 DIAGNOSIS — M6281 Muscle weakness (generalized): Secondary | ICD-10-CM

## 2018-01-23 DIAGNOSIS — M25612 Stiffness of left shoulder, not elsewhere classified: Secondary | ICD-10-CM

## 2018-01-23 DIAGNOSIS — R293 Abnormal posture: Secondary | ICD-10-CM | POA: Diagnosis not present

## 2018-01-23 DIAGNOSIS — M25532 Pain in left wrist: Secondary | ICD-10-CM

## 2018-01-23 DIAGNOSIS — R2681 Unsteadiness on feet: Secondary | ICD-10-CM | POA: Diagnosis not present

## 2018-01-23 DIAGNOSIS — M25632 Stiffness of left wrist, not elsewhere classified: Secondary | ICD-10-CM

## 2018-01-23 DIAGNOSIS — R2689 Other abnormalities of gait and mobility: Secondary | ICD-10-CM

## 2018-01-23 DIAGNOSIS — G8929 Other chronic pain: Secondary | ICD-10-CM

## 2018-01-23 NOTE — Therapy (Addendum)
[facility] [address] [place], [place], [address] Phone: [phone]   Fax:  [phone]  Physical Therapy Treatment  Patient Details  Name: Fred [name] MRN: [id number] Date of Birth: 06/25/1935 Referring Provider (PT): Dr. Letta Pate   Encounter Date: 01/23/2018  PT End of Session - 01/23/18 0956    Visit Number  10    Number of Visits  17    Date for PT Re-Evaluation  02/12/18    Authorization Type  Medicare, UHC, Tricare (no PTAs). Progress note every 10th visit.     PT Start Time  [phone]    PT Stop Time  0932    PT Time Calculation (min)  45 min    Equipment Utilized During Treatment  --   mod A -S prn   Activity Tolerance  Patient tolerated treatment well    Behavior During Therapy  WFL for tasks assessed/performed       Past Medical History:  Diagnosis Date  . Atrial fibrillation ([place])    Post operative  . CAD (coronary artery disease)    s/p cabg  . CHF (congestive heart failure) ([place])   . Diverticulosis   . DJD (degenerative joint disease)   . DM (diabetes mellitus) ([place])   . History of anemia   . History of stroke   . Hyperlipidemia   . Hypertension   . 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 Peripheral vascular disease ([place])   . Polyneuropathy, diabetic ([place])   . Seizure disorder, complex partial ([place])   . Syncope and collapse     Past Surgical History:  Procedure Laterality Date  . CATARACT EXTRACTION W/ INTRAOCULAR LENS  IMPLANT, BILATERAL  06/2013   Dr. [name]  . CORONARY ARTERY BYPASS GRAFT  02/13/2004   4 vesel  . removal of sebaceous cyst from neck  08/2007   Dr. [name]  . TONSILLECTOMY      There were no vitals filed for this visit.  Subjective Assessment - 01/23/18 0851    Subjective  Pt denied falls or changes since last visit. Pt stated he has an appt. on Thursday, 01/25/18 with the orthopedic doctor for R knee pain.     Patient is accompained  by:  Family member   Lynnae Sandhoff and wife   Pertinent History  DM with polyneuropathy, hx of cataracts, hx of CVA in 03/2017 and again 07/2017, OA in R knee, DJD, HTN, HLD, coronary atherosclerosis, a-fib, CHF, PVD, parotid lesion, diverticulitis, partial epilepsy, anemia    Patient Stated Goals  get stronger; able to walk and transfer better    Currently in Pain?  Yes    Pain Score  8    0/10 at rest and 8/10 with standing/walking   Pain Location  Knee    Pain Orientation  Right    Pain Descriptors / Indicators  Aching    Pain Type  Chronic pain    Pain Onset  More than a month ago    Pain Frequency  Intermittent    Aggravating Factors   standing, walking,     Pain Relieving Factors  resting, and "some shot in the knee helps"                       [facility] Adult PT Treatment/Exercise - 01/23/18 0900      Ambulation/Gait   Ambulation/Gait  Yes    Ambulation/Gait Assistance  4: Min assist;3: Mod assist  Ambulation/Gait Assistance Details  Pt amb. with RW and L h/o, LLE had ace bandaged donned to improve L foot dorsiflexion and eversion, with simulated shoe cap donned. Pt continues to require tactile and verbal cues to improve weight shifting onto RLE, improve L knee ext in stance, and for sequencing with RW. With w/c follow.    Ambulation Distance (Feet)  10 Feet    Assistive device  Rolling walker   with L h/o   Gait Pattern  Step-to pattern;Decreased weight shift to left;Poor foot clearance - left    Ambulation Surface  Indoor;Level      Standardized Balance Assessment   Standardized Balance Assessment  Berg Balance Test;Timed Up and Go Test      Berg Balance Test   Sit to Stand  Able to stand using hands after several tries    Standing Unsupported  Needs several tries to stand 30 seconds unsupported    Sitting with Back Unsupported but Feet Supported on Floor or Stool  Able to sit safely and securely 2 minutes    Stand to Sit  Uses backs of legs against chair to control  descent    Transfers  Needs one person to assist    Standing Unsupported with Eyes Closed  Able to stand 10 seconds with supervision    Standing Ubsupported with Feet Together  Needs help to attain position and unable to hold for 15 seconds    From Standing, Reach Forward with Outstretched Arm  Loses balance while trying/requires external support    From Standing Position, Pick up Object from Floor  Unable to try/needs assist to keep balance    From Standing Position, Turn to Look Behind Over each Shoulder  Needs supervision when turning    Turn 360 Degrees  Needs assistance while turning    Standing Unsupported, Alternately Place Feet on Step/Stool  Needs assistance to keep from falling or unable to try    Standing Unsupported, One Foot in Front  Needs help to step but can hold 15 seconds    Standing on One Leg  Unable to try or needs assist to prevent fall    Total Score  15      Timed Up and Go Test   TUG  --            Self Care: PT Education - 01/23/18 0955    Education Details  PT reviewed CVA ed handout with pt.  PT discussed the importance of performing HEP at home, as pt reported he's only performing part of HEP 2/2 lack of assistance.     Person(s) Educated  Patient;Spouse;Child(ren)    Methods  Explanation    Comprehension  Verbalized understanding       PT Short Term Goals - 01/23/18 0959      PT SHORT TERM GOAL #1   Title  Pt will be IND in HEP to improve the deficits listed above. TARGET DATE FOR ALL STGS: 01/11/18    Status  Partially Met      PT SHORT TERM GOAL #2   Title  Pt will be able to standing without UE support and feet apart for 2 minutes, with S, to perform ADLs safely.     Status  Partially Met      PT SHORT TERM GOAL #3   Title  Perform TUG, gait speed, and BERG-write STGs/LTGs, once tolerated.     Status  Partially Met      PT SHORT TERM GOAL #4  Title  Pt will amb. 100' with LRAD, over even terrain, at MOD I level to improve functional  mobility.     Status  Not Met        PT Long Term Goals - 01/23/18 0959      PT LONG TERM GOAL #1   Title  Pt will verbalize understanding of CVA risk factors and s/s of CVA to reduce risk of additional CVA. TARGET DATE FOR ALL LTGS: 02/08/18    Status  New      PT LONG TERM GOAL #2   Title  Pt will amb. 200' outdoors, with LRAD, with S, in order to walk in his yard safely.     Status  New      PT LONG TERM GOAL #3   Title  Trial manual w/c outdoors and write goal as indicated.     Status  New      PT LONG TERM GOAL #4   Title  Pt will improve BERG score to >/=20/56 to decr. falls risk.     Status  New            Plan - 01/23/18 0956    Clinical Impression Statement  PT had pt attempt BERG today (portion of STG 3) and his score indicates pt is at a high risk for falls. However, pt demonstrated progress as he was unable to attempt STS txfs without assist during eval and was able to perform dynamic standing balance portions of the BERG. Pt required frequent seated rest breaks during BERG.  Pt unable to perform TUG today as he continues to require assist during gait. STG 3 partially met and STG 4 not met. PT attempted amb. without R shoe lift but pt noted to experience incr. difficulty clearing LLE during swing phase of gait. PT will send progress note to MD. Continue with POC.     Rehab Potential  Good    Clinical Impairments Affecting Rehab Potential  left side weakness; poor left side motor control    PT Frequency  2x / week    PT Duration  8 weeks    PT Treatment/Interventions  ADLs/Self Care Home Management;Biofeedback;Canalith Repostioning;Electrical Stimulation;Therapeutic activities;Therapeutic exercise;Manual techniques;Vestibular;Wheelchair mobility training;Functional mobility training;Orthotic Fit/Training;Stair training;Gait training;Patient/family education;DME Instruction;Neuromuscular re-education;Balance training    PT Next Visit Plan   used 96m shoe lift on R,  cont to work on transfers (STS and stand pivot with RW) ;make sure he is getting weight shift correct;  scifit was good tool for recipriocal mvt and UE involvement; he wants to try to get all the way around the gym with (RW with L h/o)    Consulted and Agree with Plan of Care  Patient;Family member/caregiver    Family Member Consulted  son       Patient will benefit from skilled therapeutic intervention in order to improve the following deficits and impairments:  Abnormal gait, Decreased endurance, Decreased knowledge of use of DME, Decreased strength, Impaired UE functional use, Decreased balance, Decreased mobility, Decreased range of motion, Decreased coordination, Impaired flexibility, Postural dysfunction  Visit Diagnosis: Other abnormalities of gait and mobility  Hemiplegia and hemiparesis following cerebral infarction affecting left non-dominant side (HCC)  Unsteadiness on feet  Muscle weakness (generalized)     Problem List Patient Active Problem List   Diagnosis Date Noted  . 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 Acute blood loss anemia   . Chronic diastolic (congestive) heart failure ([place]   . Acute  idiopathic gout of right knee   . Primary osteoarthritis of right knee   . Hemiparesis affecting left side as late effect of cerebrovascular accident ([place])   . Diabetes mellitus type 2 in obese ([place])   . Small vessel disease ([place]) 08/11/2017  . Left hemiparesis ([place])   . Benign essential hypertension with delivery   . CVA (cerebral vascular accident) ([place]) 08/10/2017  . 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 of localized onset, not intractable, without status epilepticus ([place]) 06/20/2017  . Primary osteoarthritis of both knees 05/11/2017  . Stroke (cerebrum) ([place]) 03/21/2017  . Type 2 diabetes mellitus with complication, with long-term current use of insulin ([place]) 03/27/2015  . Atherosclerosis  of native coronary artery of native heart without angina pectoris 03/27/2015  . Palpitations 11/04/2014  . S/P CABG (coronary artery bypass graft) 11/04/2014  . Atrial fibrillation, unspecified   . History of stroke 09/04/2014  . Obesity (BMI 30-39.9) 11/29/2013  . History of gout 05/16/2013  . Congestive heart failure ([place]) 04/20/2009  . DYSPNEA 02/11/2009  . CARDIOVASCULAR FUNCTION STUDY, ABNORMAL 01/29/2009  . NECK PAIN 08/03/2007  . ANEMIA / OTHER 05/16/2007  . Coronary atherosclerosis 05/16/2007  . PAROXYSMAL ATRIAL FIBRILLATION 05/16/2007  . Subcortical infarction ([place]) 05/16/2007  . Hyperlipidemia 03/30/2007  . DEGENERATIVE JOINT DISEASE 03/30/2007  . PAROTID LESION, UNSPECIFIED 09/29/2006  . Partial epilepsy with impairment of consciousness ([place]) 09/29/2006  . Diabetic polyneuropathy ([place]) 09/29/2006  . Essential hypertension 09/29/2006  . PERIPHERAL VASCULAR DISEASE 09/29/2006  . DIVERTICULOSIS 09/29/2006    , L 01/23/2018, 10:01 AM  [facility] [address] [place], [place], [address] Phone: [phone]   Fax:  [phone]  Name: Fred [name] MRN: [id number] Date of Birth: 07/12/1935  Progress Note Reporting Period 12/14/17 to 01/23/18  See note below for Objective Data and Assessment of Progress/Goals.     Geoffry Paradise, PT,DPT 01/23/18 10:02 AM Phone: [phone] Fax: [phone]

## 2018-01-23 NOTE — Therapy (Signed)
[facility] [address] [place], [place], [address] Phone: [phone]   Fax:  [phone]  Occupational Therapy Treatment  Patient Details  Name: Fred [name] MRN: [id number] Date of Birth: 11/20/1935 Referring Provider (OT): Dr. Alysia Penna   Encounter Date: 01/23/2018  OT End of Session - 01/23/18 1851    Visit Number  11    Number of Visits  24    Date for OT Re-Evaluation  03/08/18    Authorization Type  Medicare, [employer], [employer] - pt will need PN every 10th visit    Authorization Time Period  90 days    Authorization - Visit Number  11    Authorization - Number of Visits  20    OT Start Time  1325    OT Stop Time  1415    OT Time Calculation (min)  50 min    Activity Tolerance  Patient tolerated treatment well       Past Medical History:  Diagnosis Date  . Atrial fibrillation ([place])    Post operative  . CAD (coronary artery disease)    s/p cabg  . CHF (congestive heart failure) ([place])   . Diverticulosis   . DJD (degenerative joint disease)   . DM (diabetes mellitus) ([place])   . History of anemia   . History of stroke   . Hyperlipidemia   . Hypertension   . 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 Peripheral vascular disease ([place])   . Polyneuropathy, diabetic ([place])   . Seizure disorder, complex partial ([place])   . Syncope and collapse     Past Surgical History:  Procedure Laterality Date  . CATARACT EXTRACTION W/ INTRAOCULAR LENS  IMPLANT, BILATERAL  06/2013   Dr. [name]  . CORONARY ARTERY BYPASS GRAFT  02/13/2004   4 vesel  . removal of sebaceous cyst from neck  08/2007   Dr. [name]  . TONSILLECTOMY      There were no vitals filed for this visit.  Subjective Assessment - 01/23/18 1849    Subjective   I like the water I want to come back here    Patient is accompained by:  Family member   son in law and wife   Pertinent History  [name] 08/09/2017. DIscharged home  on 09/22/2017 after inpt rehab stay. Pt had HH PT and OT. PMH:      Patient Stated Goals  I want to get walking again.  I am an outdoor guy    Currently in Pain?  No/denies          Patient seen for aquatic therapy today.  Treatment took place in water 2.5-4 feet deep depending upon activity.  Pt entered the pool via water wheelchair and ramp.  Today was pt's first aquatic therapy session.  Pt required 2 person assist initially to move from ramp across to side of pool due to significant perceptual deficits regarding body in space, finding midline and "pusher syndrome."  Treatment initially focused on assisting pt in finding midline while standing facing the pool with RUE support. Pt needs ongoing max cues to decrease shoulder hike and pattern of extension in RUE (pushing onto L hemiplegic side).  With repetition and experience of the water as well as facilitation of LLE for foot on the pool floor and hip and knee extension pt was eventually able to stand in midline with equal weight on both LLE's.  Pt with significant deficits in postural alignment -  when in midline and equal weight for LE's pt rotates trunk to the left and posterior while turning head to the right and hiking and pushing with RUE.  Addressed upper trunk initiated rotation in standing with RUE support progressing to no UE support with moderate facilitation.  Progressed to lateral weight shifting with cues for pt to shift weight from hips.  Also focused on functional ambulation walking forward and backward along pool wall with RUE for support. Focused on activation of LLE, full weight shift onto LLE before stepping with RLE.  Pt requires mod facilitation, max cues and step by step instruction.  Pt also with posterior bias with forward walking. Pt requires min facilitation and mod cues for backward walking.  Pt then required min a x2 for functional ambulation in open water back to ramp where he exited by water wheelchair.                       OT Short Term Goals - 01/18/18 1158      OT SHORT TERM GOAL #1   Title  Pt and family will be mod I with basic home exercise/home activities program for LUE, balance and functional mobility - 01/25/2018    Status  On-going      OT SHORT TERM GOAL #2   Title  Pt will be supervision with UB bathing    Status  On-going      OT SHORT TERM GOAL #3   Title  Pt will be min a for LB bathing    Status  On-going      OT SHORT TERM GOAL #4   Title  Pt will be close supervision for toilet transfers    Status  On-going      OT SHORT TERM GOAL #5   Title  Pt will be min a for tub bench transfers (assist with LLE)    Status  On-going      OT SHORT TERM GOAL #6   Title  Pt will tolerate shoulder flexion to 110* with no pain in supine in prep for HEP and low functional reach    Status  On-going      OT SHORT TERM GOAL #7   Title  Pt will demonstrate shoulder flexion to at least 30* in prep for low reach    Status  On-going      OT SHORT TERM GOAL #8   Title  Pt will demonstrate ability to hold light objects in L hand while manipulating with R hand (i.e opening containers) with min a    Status  On-going      OT SHORT TERM GOAL  #9   TITLE  Pt will be mod a for LB dressing    Status  On-going      OT SHORT TERM GOAL  #10   TITLE  Pt/family will be mod I with splint wear and care, prn    Status  On-going        OT Long Term Goals - 01/18/18 1159      OT LONG TERM GOAL #1   Title  Pt and family will be mod I with upgraded HEP/home activities program - 03/08/2018    Status  New      OT LONG TERM GOAL #2   Title  Pt will be min a for LB dressing    Status  New      OT LONG TERM GOAL #3   Title  Pt will  be mod I with toilet transfers    Status  New      OT LONG TERM GOAL #4   Title  Pt will be mod I with clothing manipulation with toileting activities    Status  New      OT LONG TERM GOAL #5   Title  Pt will be min a for simple familiar  hot meal prep    Status  New      OT LONG TERM GOAL #6   Title  Pt will demonstate ability to use LUE as stabilzer at least 25% of the time during basic self care and fishing activities.    Status  New      OT LONG TERM GOAL #7   Title  Pt will be mod I with casting simple fishing pole    Status  New      OT LONG TERM GOAL #8   Title  Pt will demonstate adequate postural alignment and control to complete simple activities in standing without UE support.     Status  New            Plan - 01/23/18 1850    Clinical Impression Statement  Pt seen for first time for aquatic therapy.  Pt progressing toward goals.     Occupational Profile and client history currently impacting functional performance  PMH: husband, father, grand father, retiree.  Pt loves the outdoors and fishing.  PMH: CAD, S/P CABG, DM, HTN, HLD, AFIB, CHF, seizure, PVD, obesity, ETHO abuse, TIA in 03/2017.      Occupational performance deficits (Please refer to evaluation for details):  ADL's;IADL's;Leisure;Social Participation;Rest and Sleep    Rehab Potential  Good    Current Impairments/barriers affecting progress:  medical history, severity of motor deficits    OT Frequency  2x / week    OT Duration  12 weeks    OT Treatment/Interventions  Self-care/ADL training;Aquatic Therapy;Electrical Stimulation;Moist Heat;Fluidtherapy;Ultrasound;Therapeutic exercise;Neuromuscular education;Energy conservation;DME and/or AE instruction;Passive range of motion;Manual Therapy;Functional Mobility Training;Splinting;Therapeutic activities;Patient/family education;Balance training    Plan  check LB dressing and address futher education PRN, manual therapy to address LUE shouler girdle alignment, NMR for LUE, balance, functional mobility, ADL's,     Consulted and Agree with Plan of Care  Patient;Family member/caregiver    Family Member Consulted  wife and son in law       Patient will benefit from skilled therapeutic intervention in  order to improve the following deficits and impairments:  Abnormal gait, Decreased balance, Decreased range of motion, Decreased mobility, Decreased knowledge of use of DME, Decreased strength, Difficulty walking, Impaired UE functional use, Impaired tone, Impaired flexibility, Pain  Visit Diagnosis: Unsteadiness on feet  Muscle weakness (generalized)  Abnormal posture  Spastic hemiplegia of left nondominant side as late effect of cerebral infarction (H[facility])  Chronic left shoulder pain  Pain in left wrist  Stiffness of left shoulder, not elsewhere classified  Stiffness of left wrist, not elsewhere classified    Problem List Patient Active Problem List   Diagnosis Date Noted  . 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 Acute blood loss anemia   . Chronic diastolic (congestive) heart failure ([facility])   . Acute idiopathic gout of right knee   . Primary osteoarthritis of right knee   . Hemiparesis affecting left side as late effect of cerebrovascular accident ([place])   . Diabetes mellitus type 2 in obese ([place])   . Small vessel disease ([place]) 08/11/2017  . Left  hemiparesis ([place])   . Benign essential hypertension with delivery   . CVA (cerebral vascular accident) ([place]) 08/10/2017  . 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 of localized onset, not intractable, without status epilepticus ([place]) 06/20/2017  . Primary osteoarthritis of both knees 05/11/2017  . Stroke (cerebrum) ([place]) 03/21/2017  . Type 2 diabetes mellitus with complication, with long-term current use of insulin ([place]) 03/27/2015  . Atherosclerosis of native coronary artery of native heart without angina pectoris 03/27/2015  . Palpitations 11/04/2014  . S/P CABG (coronary artery bypass graft) 11/04/2014  . Atrial fibrillation, unspecified   . History of stroke 09/04/2014  . Obesity (BMI 30-39.9) 11/29/2013  . History of gout 05/16/2013  . Congestive  heart failure ([place]) 04/20/2009  . DYSPNEA 02/11/2009  . CARDIOVASCULAR FUNCTION STUDY, ABNORMAL 01/29/2009  . NECK PAIN 08/03/2007  . ANEMIA / OTHER 05/16/2007  . Coronary atherosclerosis 05/16/2007  . PAROXYSMAL ATRIAL FIBRILLATION 05/16/2007  . Subcortical infarction ([place]) 05/16/2007  . Hyperlipidemia 03/30/2007  . DEGENERATIVE JOINT DISEASE 03/30/2007  . PAROTID LESION, UNSPECIFIED 09/29/2006  . Partial epilepsy with impairment of consciousness ([place]) 09/29/2006  . Diabetic polyneuropathy ([place]) 09/29/2006  . Essential hypertension 09/29/2006  . PERIPHERAL VASCULAR DISEASE 09/29/2006  . DIVERTICULOSIS 09/29/2006    Quay Burow, OTR/L 01/23/2018, 6:53 PM  [facility] [address] [place], [place], [address] Phone: [phone]   Fax:  [phone]  Name: Fred [name] MRN: [id number] Date of Birth: June 05, 1935

## 2018-01-23 NOTE — Patient Instructions (Signed)
Ischemic Stroke °An ischemic stroke is the sudden death of brain tissue. Blood carries oxygen to all areas of the body. This type of stroke happens when your blood does not flow to your brain like normal. Your brain cannot get the oxygen it needs. This is an emergency. It must be treated right away. °Symptoms of a stroke usually happen all of a sudden. You may notice them when you wake up. They can include: °· Weakness or loss of feeling in your face, arm, or leg. This often happens on one side of the body. °· Trouble walking. °· Trouble moving your arms or legs. °· Loss of balance or coordination. °· Feeling confused. °· Trouble talking or understanding what people are saying. °· Slurred speech. °· Trouble seeing. °· Seeing two of one object (double vision). °· Feeling dizzy. °· Feeling sick to your stomach (nauseous) and throwing up (vomiting). °· A very bad headache for no reason. ° °Get help as soon as any of these problems start. This is important. Some treatments work better if they are given right away. These include: °· Aspirin. °· Medicines to control blood pressure. °· A shot (injection) of medicine to break up the blood clot. °· Treatments given in the blood vessel (artery) to take out the clot or break it up. ° °Other treatments may include: °· Oxygen. °· Fluids given through an IV tube. °· Medicines to thin out your blood. °· Procedures to help your blood flow better. ° °What increases the risk? °Certain things may make you more likely to have a stroke. Some of these are things that you can change, such as: °· Being very overweight (obesity). °· Smoking. °· Taking birth control pills. °· Not being active. °· Drinking too much alcohol. °· Using drugs. ° °Other risk factors include: °· High blood pressure. °· High cholesterol. °· Diabetes. °· Heart disease. °· Being African American, Native American, Hispanic, or [place] Native. °· Being over age 60. °· Family history of stroke. °· Having had blood clots,  stroke, or warning stroke (transient ischemic attack, TIA) in the past. °· Sickle cell disease. °· Being a woman with a history of high blood pressure in pregnancy (preeclampsia). °· Migraine headache. °· Sleep apnea. °· Having an irregular heartbeat (atrial fibrillation). °· Long-term (chronic) diseases that cause soreness and swelling (inflammation). °· Disorders that affect how your blood clots. ° °Follow these instructions at home: °Medicines °· Take over-the-counter and prescription medicines only as told by your doctor. °· If you were told to take aspirin or another medicine to thin your blood, take it exactly as told by your doctor. °? Taking too much of the medicine can cause bleeding. °? If you do not take enough, it may not work as well. °· Know the side effects of your medicines. If you are taking a blood thinner, make sure you: °? Hold pressure over any cuts for longer than usual. °? Tell your dentist and other doctors that you take this medicine. °? Avoid activities that may cause damage or injury to your body. °Eating and drinking °· Follow instructions from your doctor about what you cannot eat or drink. °· Eat healthy foods. °· If you have trouble with swallowing, do these things to avoid choking: °? Take small bites when eating. °? Eat foods that are soft or pureed. °Safety °· Follow instructions from your health care team about physical activity. °· Use a walker or cane as told by your doctor. °· Keep your home safe so   you do not fall. This may include: °? Having experts look at your home to make sure it is safe. °? Putting grab bars in the bedroom and bathroom. °? Using raised toilets. °? Putting a seat in the shower. °General instructions °· Do not use any tobacco products. °? Examples of these are cigarettes, chewing tobacco, and e-cigarettes. °? If you need help quitting, ask your doctor. °· Limit how much alcohol you drink. This means no more than 1 drink a day for nonpregnant women and 2  drinks a day for men. One drink equals 12 oz of beer, 5 oz of wine, or 1½ oz of hard liquor. °· If you need help to stop using drugs or alcohol, ask your doctor to refer you to a program or specialist. °· Stay active. Exercise as told by your doctor. °· Keep all follow-up visits as told by your doctor. This is important. °Get help right away if: °· You suddenly: °? Have weakness or loss of feeling in your face, arm, or leg. °? Feel confused. °? Have trouble talking or understanding what people are saying. °? Have trouble seeing. °? Have trouble walking. °? Have trouble moving your arms or legs. °? Feel dizzy. °? Lose your balance or coordination. °? Have a very bad headache and you do not know why. °· You pass out (lose consciousness) or almost pass out. °· You have jerky movements that you cannot control (seizure). °These symptoms may be an emergency. Do not wait to see if the symptoms will go away. Get medical help right away. Call your local emergency services (911 in the [place] Do not drive yourself to the hospital. °This information is not intended to replace advice given to you by your health care provider. Make sure you discuss any questions you have with your health care provider. °Document Released: 02/17/2011 Document Revised: 08/11/2015 Document Reviewed: 05/27/2015 °Elsevier Interactive Patient Education © 2018 [employer] ° °

## 2018-01-25 ENCOUNTER — Encounter: Payer: Self-pay | Admitting: Sports Medicine

## 2018-01-25 ENCOUNTER — Ambulatory Visit (INDEPENDENT_AMBULATORY_CARE_PROVIDER_SITE_OTHER): Payer: Medicare Other | Admitting: Sports Medicine

## 2018-01-25 ENCOUNTER — Ambulatory Visit: Payer: Medicare Other | Admitting: Occupational Therapy

## 2018-01-25 ENCOUNTER — Encounter: Payer: Self-pay | Admitting: Occupational Therapy

## 2018-01-25 ENCOUNTER — Ambulatory Visit: Payer: Medicare Other | Admitting: Rehabilitation

## 2018-01-25 ENCOUNTER — Encounter: Payer: Self-pay | Admitting: Rehabilitation

## 2018-01-25 DIAGNOSIS — G8929 Other chronic pain: Secondary | ICD-10-CM

## 2018-01-25 DIAGNOSIS — M6281 Muscle weakness (generalized): Secondary | ICD-10-CM

## 2018-01-25 DIAGNOSIS — M25612 Stiffness of left shoulder, not elsewhere classified: Secondary | ICD-10-CM

## 2018-01-25 DIAGNOSIS — R2681 Unsteadiness on feet: Secondary | ICD-10-CM | POA: Diagnosis not present

## 2018-01-25 DIAGNOSIS — I639 Cerebral infarction, unspecified: Secondary | ICD-10-CM | POA: Diagnosis not present

## 2018-01-25 DIAGNOSIS — M25532 Pain in left wrist: Secondary | ICD-10-CM

## 2018-01-25 DIAGNOSIS — M25512 Pain in left shoulder: Secondary | ICD-10-CM | POA: Diagnosis not present

## 2018-01-25 DIAGNOSIS — M25632 Stiffness of left wrist, not elsewhere classified: Secondary | ICD-10-CM

## 2018-01-25 DIAGNOSIS — I69354 Hemiplegia and hemiparesis following cerebral infarction affecting left non-dominant side: Secondary | ICD-10-CM

## 2018-01-25 DIAGNOSIS — M170 Bilateral primary osteoarthritis of knee: Secondary | ICD-10-CM | POA: Diagnosis not present

## 2018-01-25 DIAGNOSIS — R2689 Other abnormalities of gait and mobility: Secondary | ICD-10-CM

## 2018-01-25 DIAGNOSIS — R293 Abnormal posture: Secondary | ICD-10-CM

## 2018-01-25 NOTE — Assessment & Plan Note (Signed)
Did well with an Orthovisc series into both knees approximately 7 months ago, with a recurrence of pain now, right knee, swelling. Suspected gout with high uric acid levels in the blood in the hospital. Aspiration and injection, crystal analysis to see if this is truly gout or just osteoarthritis. He already has stroke PT and aquatic therapy set up at the [facility] physical therapy location. Return in 1 month.

## 2018-01-25 NOTE — Therapy (Signed)
[facility] [address] [place], [place], [address] Phone: [phone]   Fax:  [phone]  Occupational Therapy Treatment  Patient Details  Name: Fred [name] MRN: [id number] Date of Birth: 30-Jul-1935 Referring Provider (OT): Dr. Alysia Penna   Encounter Date: 01/25/2018  OT End of Session - 01/25/18 1209    Visit Number  12    Number of Visits  24    Authorization Type  Medicare, [employer], [place] - pt will need PN every 10th visit    Authorization Time Period  90 days    Authorization - Visit Number  12    Authorization - Number of Visits  20    OT Start Time  0932    OT Stop Time  0177    OT Time Calculation (min)  43 min    Activity Tolerance  Patient tolerated treatment well       Past Medical History:  Diagnosis Date  . Atrial fibrillation ([place])    Post operative  . CAD (coronary artery disease)    s/p cabg  . CHF (congestive heart failure) ([place])   . Diverticulosis   . DJD (degenerative joint disease)   . DM (diabetes mellitus) ([place])   . History of anemia   . History of stroke   . Hyperlipidemia   . Hypertension   . 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 Peripheral vascular disease ([place])   . Polyneuropathy, diabetic ([place])   . Seizure disorder, complex partial ([place])   . Syncope and collapse     Past Surgical History:  Procedure Laterality Date  . CATARACT EXTRACTION W/ INTRAOCULAR LENS  IMPLANT, BILATERAL  06/2013   Dr. [name]  . CORONARY ARTERY BYPASS GRAFT  02/13/2004   4 vesel  . removal of sebaceous cyst from neck  08/2007   Dr. [name]  . TONSILLECTOMY      There were no vitals filed for this visit.  Subjective Assessment - 01/25/18 0941    Subjective   My thumb hurts sometimes when it gets bumped.    Patient is accompained by:  Family member   wife and dtr   Pertinent History  [name] 08/09/2017. DIscharged home on 09/22/2017 after inpt rehab stay. Pt had  HH PT and OT. PMH:      Patient Stated Goals  I want to get walking again.  I am an outdoor guy    Currently in Pain?  No/denies    Pain Score  9     Pain Location  Finger (Comment which one)   thumb   Pain Orientation  Left    Pain Descriptors / Indicators  Sharp    Pain Type  Chronic pain    Pain Onset  More than a month ago    Pain Frequency  Intermittent    Aggravating Factors   when thumb gets bumped or pulled - when I put my jacket on                   OT Treatments/Exercises (OP) - 01/25/18 0001      Neurological Re-education Exercises   Other Exercises 1  Neuro re ed to address LUE for decreasing spasticity, increasing proximal stability and stable mobility with body and arm together progressing to body on arm all in weight bearing as prep for using RW with hand orthosis.  Pt has active isloated movement in LUE however very difficult for pt to activate due  to sensory loss, spasticity and weakness.  Addreseed sit to stand, static and dynamic standing balance with emphasis on postural alignmen and control. Pt with inconsistent ability to find midline in static standing as well as to initiate weight shift to the right.  Addressed upper trunk rotation to R as pt postures with trunk facing to the left in static standing.  Also addressed active controlled stand to sit as welll as squat pivot transfers with emphasis on increasing activity in LLE and trunk.       Splinting   Splinting  Pt reports thumb pain - suspect that thumb is getting caught and pulled back with dressing, bed mobility,etc and is now inflamed.  Pt issued thumb spica splint to wear at home to protect thunmb and allow imflammation and overstretching to improve.  Pt and family know that pt should wear most of the day over the next 2 weeks with breaks for skin with LUE elevated on half lap tray when splint is not on.                OT Short Term Goals - 01/25/18 1208      OT SHORT TERM GOAL #1   Title   Pt and family will be mod I with basic home exercise/home activities program for LUE, balance and functional mobility - 01/25/2018    Status  On-going      OT SHORT TERM GOAL #2   Title  Pt will be supervision with UB bathing    Status  Achieved   pt is able to do however wife continues to assist     OT [place] #3   Title  Pt will be min a for LB bathing    Status  On-going      OT SHORT TERM GOAL #4   Title  Pt will be close supervision for toilet transfers    Status  On-going      OT SHORT TERM GOAL #5   Title  Pt will be min a for tub bench transfers (assist with LLE)    Status  On-going      OT SHORT TERM GOAL #6   Title  Pt will tolerate shoulder flexion to 110* with no pain in supine in prep for HEP and low functional reach    Status  On-going      OT SHORT TERM GOAL #7   Title  Pt will demonstrate shoulder flexion to at least 30* in prep for low reach    Status  On-going      OT SHORT TERM GOAL #8   Title  Pt will demonstrate ability to hold light objects in L hand while manipulating with R hand (i.e opening containers) with min a    Status  On-going      OT SHORT TERM GOAL  #9   TITLE  Pt will be mod a for LB dressing    Status  On-going      OT SHORT TERM GOAL  #10   TITLE  Pt/family will be mod I with splint wear and care, prn    Status  On-going        OT Long Term Goals - 01/25/18 1208      OT LONG TERM GOAL #1   Title  Pt and family will be mod I with upgraded HEP/home activities program - 03/08/2018    Status  New      OT LONG TERM GOAL #2   Title  Pt will be min a for LB dressing    Status  New      OT LONG TERM GOAL #3   Title  Pt will be mod I with toilet transfers    Status  New      OT LONG TERM GOAL #4   Title  Pt will be mod I with clothing manipulation with toileting activities    Status  New      OT LONG TERM GOAL #5   Title  Pt will be min a for simple familiar hot meal prep    Status  New      OT LONG TERM GOAL #6    Title  Pt will demonstate ability to use LUE as stabilzer at least 25% of the time during basic self care and fishing activities.    Status  New      OT LONG TERM GOAL #7   Title  Pt will be mod I with casting simple fishing pole    Status  New      OT LONG TERM GOAL #8   Title  Pt will demonstate adequate postural alignment and control to complete simple activities in standing without UE support.     Status  New            Plan - 01/25/18 1209    Clinical Impression Statement  Pt progressing toward goals. Will reassess self care next session as well as check STG's.    Occupational Profile and client history currently impacting functional performance  PMH: husband, father, grand father, retiree.  Pt loves the outdoors and fishing.  PMH: CAD, S/P CABG, DM, HTN, HLD, AFIB, CHF, seizure, PVD, obesity, ETHO abuse, TIA in 03/2017.      Occupational performance deficits (Please refer to evaluation for details):  ADL's;IADL's;Leisure;Social Participation;Rest and Sleep    Rehab Potential  Good    Current Impairments/barriers affecting progress:  medical history, severity of motor deficits    OT Frequency  2x / week    OT Duration  12 weeks    OT Treatment/Interventions  Self-care/ADL training;Aquatic Therapy;Electrical Stimulation;Moist Heat;Fluidtherapy;Ultrasound;Therapeutic exercise;Neuromuscular education;Energy conservation;DME and/or AE instruction;Passive range of motion;Manual Therapy;Functional Mobility Training;Splinting;Therapeutic activities;Patient/family education;Balance training    Plan  check STG's, check LB dressing and address futher education PRN, manual therapy to address LUE shouler girdle alignment, NMR for LUE, balance, functional mobility, ADL's,     Consulted and Agree with Plan of Care  Patient;Family member/caregiver    Family Member Consulted  wife and dtr       Patient will benefit from skilled therapeutic intervention in order to improve the following deficits  and impairments:  Abnormal gait, Decreased balance, Decreased range of motion, Decreased mobility, Decreased knowledge of use of DME, Decreased strength, Difficulty walking, Impaired UE functional use, Impaired tone, Impaired flexibility, Pain  Visit Diagnosis: Unsteadiness on feet  Muscle weakness (generalized)  Abnormal posture  Spastic hemiplegia of left nondominant side as late effect of cerebral infarction (HCC)  Chronic left shoulder pain  Pain in left wrist  Stiffness of left shoulder, not elsewhere classified  Stiffness of left wrist, not elsewhere classified    Problem List Patient Active Problem List   Diagnosis Date Noted  . 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 Acute blood loss anemia   . Chronic diastolic (congestive) heart failure ([place])   . Acute idiopathic gout of right knee   . Primary osteoarthritis of right knee   . Hemiparesis  affecting left side as late effect of cerebrovascular accident ([place])   . Diabetes mellitus type 2 in obese ([place])   . Small vessel disease ([place]) 08/11/2017  . Left hemiparesis ([place])   . Benign essential hypertension with delivery   . CVA (cerebral vascular accident) ([place]) 08/10/2017  . 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 of localized onset, not intractable, without status epilepticus ([place]) 06/20/2017  . Primary osteoarthritis of both knees 05/11/2017  . Stroke (cerebrum) ([place]) 03/21/2017  . Type 2 diabetes mellitus with complication, with long-term current use of insulin ([place]) 03/27/2015  . Atherosclerosis of native coronary artery of native heart without angina pectoris 03/27/2015  . Palpitations 11/04/2014  . S/P CABG (coronary artery bypass graft) 11/04/2014  . Atrial fibrillation, unspecified   . History of stroke 09/04/2014  . Obesity (BMI 30-39.9) 11/29/2013  . History of gout 05/16/2013  . Congestive heart failure ([place]) 04/20/2009  .  DYSPNEA 02/11/2009  . CARDIOVASCULAR FUNCTION STUDY, ABNORMAL 01/29/2009  . NECK PAIN 08/03/2007  . ANEMIA / OTHER 05/16/2007  . Coronary atherosclerosis 05/16/2007  . PAROXYSMAL ATRIAL FIBRILLATION 05/16/2007  . Subcortical infarction ([place]) 05/16/2007  . Hyperlipidemia 03/30/2007  . DEGENERATIVE JOINT DISEASE 03/30/2007  . PAROTID LESION, UNSPECIFIED 09/29/2006  . Partial epilepsy with impairment of consciousness ([place]) 09/29/2006  . Diabetic polyneuropathy ([place]) 09/29/2006  . Essential hypertension 09/29/2006  . PERIPHERAL VASCULAR DISEASE 09/29/2006  . DIVERTICULOSIS 09/29/2006    Quay Burow 01/25/2018, 12:11 PM  [facility] [address] [place], [place], [address] Phone: [phone]   Fax:  [phone]  Name: Fred [name] MRN: [id number] Date of Birth: 07-14-1935

## 2018-01-25 NOTE — Progress Notes (Signed)
Subjective:    CC: Right knee pain  HPI: Fred [name] is a pleasant 82 year old male with knee osteoarthritis, we did a series of Orthovisc approximately 7 months ago with good relief, unfortunately he sustained a stroke with right-sided spastic hemiplegia.  He is working well in physical therapy, but is having a recurrence of pain in his right knee.  It was noted in the hospital that his uric acid levels were high in the blood.  He is here for further evaluation and definitive treatment, pain is moderate, persistent, localized in the suprapatellar recess with significant swelling.  No mechanical symptoms, no trauma.  I reviewed the past medical history, family history, social history, surgical history, and allergies today and no changes were needed.  Please see the problem list section below in epic for further details.  Past Medical History: Past Medical History:  Diagnosis Date  . Atrial fibrillation ([place])    Post operative  . CAD (coronary artery disease)    s/p cabg  . CHF (congestive heart failure) ([place])   . Diverticulosis   . DJD (degenerative joint disease)   . DM (diabetes mellitus) ([place])   . History of anemia   . History of stroke   . Hyperlipidemia   . Hypertension   . 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 Peripheral vascular disease ([place])   . Polyneuropathy, diabetic ([place])   . Seizure disorder, complex partial ([place])   . Syncope and collapse    Past Surgical History: Past Surgical History:  Procedure Laterality Date  . CATARACT EXTRACTION W/ INTRAOCULAR LENS  IMPLANT, BILATERAL  06/2013   Dr. [name]  . CORONARY ARTERY BYPASS GRAFT  02/13/2004   4 vesel  . removal of sebaceous cyst from neck  08/2007   Dr. [name]  . TONSILLECTOMY     Social History: Social History   Socioeconomic History  . Marital status: Married    Spouse name: Liahm Grivas  . Number of children: 3  . Years of education: Not on file  . Highest education level: Not on  file  Occupational History  . 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 Financial resource strain: Not on file  . Food insecurity:    Worry: Not on file    Inability: Not on file  . Transportation needs:    Medical: Not on file    Non-medical: Not on file  Tobacco Use  . Smoking status: Former Smoker    Packs/day: 1.00    Years: 22.00    Pack years: 22.00    Types: Cigarettes    Last attempt to quit: 03/14/1977    Years since quitting: 40.8  . Smokeless tobacco: Never Used  Substance and Sexual Activity  . Alcohol use: Yes    Comment: Pt has not had alcohol since 03/2017 CVA  . Drug use: No  . Sexual activity: Never  Lifestyle  . Physical activity:    Days per week: Not on file    Minutes per session: Not on file  . Stress: Not on file  Relationships  . Social connections:    Talks on phone: Not on file    Gets together: Not on file    Attends religious service: Not on file    Active member of club or organization: Not on file    Attends meetings of clubs or organizations: Not on file    Relationship status: Not  on file  Other Topics Concern  . Not on file  Social History Narrative   3 caffeinated drinks per day. Mostly coffee. He walks 15 minutes 3 days per week. Quit smoking in 1978.      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    Family History: Family History  Problem Relation Age of Onset  . Epilepsy Father   . Stroke Mother   . Lung cancer Unknown    Allergies: Allergies  Allergen Reactions  . Lisinopril Cough   Medications: See med rec.  Review of Systems: No fevers, chills, night sweats, weight loss, chest pain, or shortness of breath.   Objective:    General: Well Developed, well nourished, and in no acute distress.  Neuro: Alert and oriented x3, extra-ocular muscles intact, sensation grossly intact.    HEENT: Normocephalic, atraumatic, pupils equal round reactive to light, neck supple, no masses, no lymphadenopathy, thyroid nonpalpable.  Skin: Warm and dry, no rashes. Cardiac: Regular rate and rhythm, no murmurs rubs or gallops, no lower extremity edema.  Respiratory: Clear to auscultation bilaterally. Not using accessory muscles, speaking in full sentences. Right knee: Visibly swollen with a palpable fluid wave and effusion ROM normal in flexion and extension and lower leg rotation. Ligaments with solid consistent endpoints including ACL, PCL, LCL, MCL. Negative Mcmurray's and provocative meniscal tests. Non painful patellar compression. Patellar and quadriceps tendons unremarkable. Hamstring and quadriceps strength is normal.  Procedure: Real-time Ultrasound Guided aspiration/injection of right knee Device: GE Logiq E  Verbal informed consent obtained.  Time-out conducted.  Noted no overlying erythema, induration, or other signs of local infection.  Skin prepped in a sterile fashion.  Local anesthesia: Topical Ethyl chloride.  With sterile technique and under real time ultrasound guidance: Using an 18-gauge needle advanced into the suprapatellar recess and aspirated 30 mL of slightly cloudy, yellowish clear fluid.  Syringe switched and 1 cc kenalog 40, 2 cc lidocaine, 2 cc bupivacaine injected easily Completed without difficulty  Pain immediately resolved suggesting accurate placement of the medication.  Advised to call if fevers/chills, erythema, induration, drainage, or persistent bleeding.  Images permanently stored and available for review in the ultrasound unit.  Impression: Technically successful ultrasound guided injection.  Impression and Recommendations:    Primary osteoarthritis of both knees Did well with an Orthovisc series into both knees approximately 7 months ago, with a recurrence of pain now, right knee, swelling. Suspected gout with high uric acid levels in the  blood in the hospital. Aspiration and injection, crystal analysis to see if this is truly gout or just osteoarthritis. He already has stroke PT and aquatic therapy set up at the [facility] physical therapy location. Return in 1 month. ___________________________________________ [name]. Dianah Field, M.D., ABFM., CAQSM. Primary Care and Sports Medicine [facility]  Adjunct Professor of [employer]

## 2018-01-25 NOTE — Therapy (Signed)
[facility] [address] [place], [place], [address] Phone: [phone]   Fax:  [phone]  Physical Therapy Treatment  Patient Details  Name: Fred [name] MRN: [id number] Date of Birth: [birth year] Referring Provider (PT): Dr. Letta Pate   Encounter Date: 01/25/2018  PT End of Session - 01/25/18 1456    Visit Number  11    Number of Visits  17    Date for PT Re-Evaluation  02/12/18    Authorization Type  Medicare, UHC, Tricare (no PTAs). Progress note every 10th visit.     PT Start Time  1017    PT Stop Time  1104    PT Time Calculation (min)  47 min    Equipment Utilized During Treatment  --   mod A -S prn   Activity Tolerance  Patient tolerated treatment well    Behavior During Therapy  WFL for tasks assessed/performed       Past Medical History:  Diagnosis Date  . Atrial fibrillation ([place])    Post operative  . CAD (coronary artery disease)    s/p cabg  . CHF (congestive heart failure) ([place])   . Diverticulosis   . DJD (degenerative joint disease)   . DM (diabetes mellitus) ([place])   . History of anemia   . History of stroke   . Hyperlipidemia   . Hypertension   . 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 Peripheral vascular disease ([place])   . Polyneuropathy, diabetic ([place])   . Seizure disorder, complex partial ([place])   . Syncope and collapse     Past Surgical History:  Procedure Laterality Date  . CATARACT EXTRACTION W/ INTRAOCULAR LENS  IMPLANT, BILATERAL  06/2013   Dr. [name]  . CORONARY ARTERY BYPASS GRAFT  02/13/2004   4 vesel  . removal of sebaceous cyst from neck  08/2007   Dr. [name]  . TONSILLECTOMY      There were no vitals filed for this visit.  Subjective Assessment - 01/25/18 1209    Subjective  Pt reports forgetting to wear brace/lace up shoes today.  Gets injection in knee today following session.     Pertinent History  DM with polyneuropathy, hx of  cataracts, hx of CVA in 03/2017 and again 07/2017, OA in R knee, DJD, HTN, HLD, coronary atherosclerosis, a-fib, CHF, PVD, parotid lesion, diverticulitis, partial epilepsy, anemia    Patient Stated Goals  get stronger; able to walk and transfer better    Currently in Pain?  No/denies                       [facility] Adult PT Treatment/Exercise - 01/25/18 0001      Transfers   Transfers  Sit to Stand;Stand to Sit    Sit to Stand  5: Supervision;4: Min assist    Sit to Stand Details  Tactile cues for placement;Verbal cues for technique;Verbal cues for sequencing;Verbal cues for safe use of DME/AE;Manual facilitation for weight shifting    Sit to Stand Details (indicate cue type and reason)  Pt able to stand at sink at S level however note that he tends to grab and pull himself up with sink, therefore provided cues for decreasing UE support (can still be there) but use as little as possible.  Pt able to do with light facilitation for forward weight shift.     Stand to Sit  4: Min guard    Stand to Sit Details (  indicate cue type and reason)  Verbal cues for sequencing;Verbal cues for technique;Manual facilitation for weight shifting    Stand to Sit Details  cues for "sitting to the R" for more midline posture      Ambulation/Gait   Ambulation/Gait  Yes    Ambulation/Gait Assistance  4: Min assist;3: Mod assist    Ambulation/Gait Assistance Details  Utilized 60m shoe lift on RLE, used L blue rocker as pt forgot his AFO and used simulated toe cap on L shoe during gait with RW and L hand orthosis.  Pt continues to require increased cues initially however with increased stepping, does better with R lateral weight shift.  PT needing to assist with forward advancement of LLE (pt is able to initiate).      Ambulation Distance (Feet)  15 Feet    Assistive device  Rolling walker   L hand orthosis   Gait Pattern  Step-to pattern;Decreased weight shift to left;Poor foot clearance - left     Ambulation Surface  Level;Indoor      Therapeutic Activites    Therapeutic Activities  Other Therapeutic Activities    Other Therapeutic Activities  With daughter present during session to provide education, PT went over standing at sink (lining up with middle of sink to ensure equal WB).  Pt initially performed without lift on R side and note that he did not have AFO donned, therefore had decreased L knee control and decreased ability to maintain upright posture.  Performed lateral weight shifting, but again, note pt had marked difficulty with L knee activation and difficulty with weight shift due to leg length difference.  Therefore PT added 951mshoe lift to R shoe and performed standing again.  Pt did much better with standing balance, ability to perform weight shifts and also worked on having him slide R hand down counter top for R lateral weight shift.  Provided hands on cues to daughter as well as verbal cues for assisting pt from behind in order to assist at pelvis as he tends to lean pelvis forward and arch back posteriorly rather than engaging core.  Pt and daughter verbalized understanding.               PT Education - 01/25/18 1455    Education Details  Provided education on standing at sink at home with daughter    Person(s) Educated  Patient;Child(ren)    Methods  Explanation;Demonstration;Tactile cues;Verbal cues    Comprehension  Verbalized understanding;Returned demonstration;Verbal cues required;Tactile cues required       PT Short Term Goals - 01/23/18 0959      PT SHORT TERM GOAL #1   Title  Pt will be IND in HEP to improve the deficits listed above. TARGET DATE FOR ALL STGS: 01/11/18    Status  Partially Met      PT SHORT TERM GOAL #2   Title  Pt will be able to standing without UE support and feet apart for 2 minutes, with S, to perform ADLs safely.     Status  Partially Met      PT SHORT TERM GOAL #3   Title  Perform TUG, gait speed, and BERG-write STGs/LTGs,  once tolerated.     Status  Partially Met      PT SHORT TERM GOAL #4   Title  Pt will amb. 100' with LRAD, over even terrain, at MOD I level to improve functional mobility.     Status  Not Met  PT Long Term Goals - 01/23/18 0959      PT LONG TERM GOAL #1   Title  Pt will verbalize understanding of CVA risk factors and s/s of CVA to reduce risk of additional CVA. TARGET DATE FOR ALL LTGS: 02/08/18    Status  New      PT LONG TERM GOAL #2   Title  Pt will amb. 200' outdoors, with LRAD, with S, in order to walk in his yard safely.     Status  New      PT LONG TERM GOAL #3   Title  Trial manual w/c outdoors and write goal as indicated.     Status  New      PT LONG TERM GOAL #4   Title  Pt will improve BERG score to >/=20/56 to decr. falls risk.     Status  New            Plan - 01/25/18 1456    Clinical Impression Statement  Skilled session focused on providing education and demonstration of standing exercises at the sink to be done with family other than wife (he is worried that he will fall and/or hurt her).  Also continue to work on gait with RW and L hand orthosis with addition of R shoe lift.  Chris from [facility] to be at next visit to assess further.     Rehab Potential  Good    Clinical Impairments Affecting Rehab Potential  left side weakness; poor left side motor control    PT Frequency  2x / week    PT Duration  8 weeks    PT Treatment/Interventions  ADLs/Self Care Home Management;Biofeedback;Canalith Repostioning;Electrical Stimulation;Therapeutic activities;Therapeutic exercise;Manual techniques;Vestibular;Wheelchair mobility training;Functional mobility training;Orthotic Fit/Training;Stair training;Gait training;Patient/family education;DME Instruction;Neuromuscular re-education;Balance training    PT Next Visit Plan   used 49m shoe lift on R, cont to work on transfers (STS and stand pivot with RW) ;make sure he is getting weight shift correct;  scifit was good  tool for recipriocal mvt and UE involvement; he wants to try to get all the way around the gym with (RW with L h/o)    Recommended Other Services  CGerald Stabsto be present at 11/19 session with Audra.     Consulted and Agree with Plan of Care  Patient;Family member/caregiver    Family Member Consulted  son       Patient will benefit from skilled therapeutic intervention in order to improve the following deficits and impairments:  Abnormal gait, Decreased endurance, Decreased knowledge of use of DME, Decreased strength, Impaired UE functional use, Decreased balance, Decreased mobility, Decreased range of motion, Decreased coordination, Impaired flexibility, Postural dysfunction  Visit Diagnosis: Unsteadiness on feet  Muscle weakness (generalized)  Other abnormalities of gait and mobility  Hemiplegia and hemiparesis following cerebral infarction affecting left non-dominant side ([facility]     Problem List Patient Active Problem List   Diagnosis Date Noted  . 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 Acute blood loss anemia   . Chronic diastolic (congestive) heart failure ([place]   . Acute idiopathic gout of right knee   . Primary osteoarthritis of right knee   . Hemiparesis affecting left side as late effect of cerebrovascular accident ([place]   . Diabetes mellitus type 2 in obese ([place]   . Small vessel disease ([place] 08/11/2017  . Left hemiparesis ([place]   . Benign essential hypertension with delivery   . CVA (cerebral vascular accident) ([place] 08/10/2017  .  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 of localized onset, not intractable, without status epilepticus ([place]) 06/20/2017  . Primary osteoarthritis of both knees 05/11/2017  . Stroke (cerebrum) ([place]) 03/21/2017  . Type 2 diabetes mellitus with complication, with long-term current use of insulin ([place]) 03/27/2015  . Atherosclerosis of native coronary artery of native  heart without angina pectoris 03/27/2015  . Palpitations 11/04/2014  . S/P CABG (coronary artery bypass graft) 11/04/2014  . Atrial fibrillation, unspecified   . History of stroke 09/04/2014  . Obesity (BMI 30-39.9) 11/29/2013  . History of gout 05/16/2013  . Congestive heart failure ([place]) 04/20/2009  . DYSPNEA 02/11/2009  . CARDIOVASCULAR FUNCTION STUDY, ABNORMAL 01/29/2009  . NECK PAIN 08/03/2007  . ANEMIA / OTHER 05/16/2007  . Coronary atherosclerosis 05/16/2007  . PAROXYSMAL ATRIAL FIBRILLATION 05/16/2007  . Subcortical infarction ([place]) 05/16/2007  . Hyperlipidemia 03/30/2007  . DEGENERATIVE JOINT DISEASE 03/30/2007  . PAROTID LESION, UNSPECIFIED 09/29/2006  . Partial epilepsy with impairment of consciousness ([place]) 09/29/2006  . Diabetic polyneuropathy ([place]) 09/29/2006  . Essential hypertension 09/29/2006  . PERIPHERAL VASCULAR DISEASE 09/29/2006  . DIVERTICULOSIS 09/29/2006    Cameron Sprang, PT, MPT [facility] [address] [place], [place], [address] Phone: [phone]   Fax:  [phone] 01/25/18, 3:08 PM  Name: Fred [name] MRN: [id number] Date of Birth: [birth year]

## 2018-01-26 LAB — SYNOVIAL FLUID, CRYSTAL

## 2018-01-30 ENCOUNTER — Ambulatory Visit: Payer: Medicare Other | Admitting: Physical Therapy

## 2018-01-30 ENCOUNTER — Ambulatory Visit: Payer: Medicare Other | Admitting: Occupational Therapy

## 2018-01-30 ENCOUNTER — Encounter: Payer: Self-pay | Admitting: Occupational Therapy

## 2018-01-30 ENCOUNTER — Encounter: Payer: Medicare Other | Admitting: Occupational Therapy

## 2018-01-30 DIAGNOSIS — R293 Abnormal posture: Secondary | ICD-10-CM | POA: Diagnosis not present

## 2018-01-30 DIAGNOSIS — M25612 Stiffness of left shoulder, not elsewhere classified: Secondary | ICD-10-CM

## 2018-01-30 DIAGNOSIS — R2689 Other abnormalities of gait and mobility: Secondary | ICD-10-CM

## 2018-01-30 DIAGNOSIS — M25512 Pain in left shoulder: Secondary | ICD-10-CM

## 2018-01-30 DIAGNOSIS — G8929 Other chronic pain: Secondary | ICD-10-CM

## 2018-01-30 DIAGNOSIS — M25532 Pain in left wrist: Secondary | ICD-10-CM

## 2018-01-30 DIAGNOSIS — R2681 Unsteadiness on feet: Secondary | ICD-10-CM

## 2018-01-30 DIAGNOSIS — I69354 Hemiplegia and hemiparesis following cerebral infarction affecting left non-dominant side: Secondary | ICD-10-CM

## 2018-01-30 DIAGNOSIS — M25632 Stiffness of left wrist, not elsewhere classified: Secondary | ICD-10-CM

## 2018-01-30 DIAGNOSIS — M6281 Muscle weakness (generalized): Secondary | ICD-10-CM

## 2018-01-30 NOTE — Therapy (Signed)
[facility] [address] [place], [place], [address] Phone: [phone]   Fax:  [phone]  Physical Therapy Treatment  Patient Details  Name: Fred [name] MRN: [id number] Date of Birth: 07/16/1935 Referring Provider (PT): Dr. Letta Pate   Encounter Date: 01/30/2018  PT End of Session - 01/30/18 1308    Visit Number  12    Number of Visits  17    Date for PT Re-Evaluation  02/12/18    Authorization Type  Medicare, UHC, Tricare (no PTAs). Progress note every 10th visit.     PT Start Time  1153    PT Stop Time  1235    PT Time Calculation (min)  42 min    Equipment Utilized During Treatment  --   mod A -S prn   Activity Tolerance  Patient tolerated treatment well    Behavior During Therapy  WFL for tasks assessed/performed       Past Medical History:  Diagnosis Date  . Atrial fibrillation ([place])    Post operative  . CAD (coronary artery disease)    s/p cabg  . CHF (congestive heart failure) ([place])   . Diverticulosis   . DJD (degenerative joint disease)   . DM (diabetes mellitus) ([place])   . History of anemia   . History of stroke   . Hyperlipidemia   . Hypertension   . 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 Peripheral vascular disease ([place])   . Polyneuropathy, diabetic ([place])   . Seizure disorder, complex partial ([place])   . Syncope and collapse     Past Surgical History:  Procedure Laterality Date  . CATARACT EXTRACTION W/ INTRAOCULAR LENS  IMPLANT, BILATERAL  06/2013   Dr. [name]  . CORONARY ARTERY BYPASS GRAFT  02/13/2004   4 vesel  . removal of sebaceous cyst from neck  08/2007   Dr. [name]  . TONSILLECTOMY      There were no vitals filed for this visit.  Subjective Assessment - 01/30/18 1256    Subjective  Had injection last week, doesn't notice a big difference in R knee.  Didn't wear lace up shoes and brace again today.  Wife has a hard time helping him put his lace  up shoes on so they do slide on.    Pertinent History  DM with polyneuropathy, hx of cataracts, hx of CVA in 03/2017 and again 07/2017, OA in R knee, DJD, HTN, HLD, coronary atherosclerosis, a-fib, CHF, PVD, parotid lesion, diverticulitis, partial epilepsy, anemia    Patient Stated Goals  get stronger; able to walk and transfer better    Currently in Pain?  No/denies                       [facility] Adult PT Treatment/Exercise - 01/30/18 1257      Bed Mobility   Bed Mobility  Supine to Sit;Sit to Supine    Supine to Sit  Moderate Assistance - Patient 50-74%    Sit to Supine  Moderate Assistance - Patient 50-74%    Sit to Supine - Details (indicate cue type and reason)  Lying down to L side and then rolling to L side > sit with max verbal cues for sequencing and assistance to move LUE to safe position to avoid impingement      Transfers   Transfers  Sit to Stand;Stand to Sit    Sit to Stand  4: Min guard  Stand to Sit  4: Min guard    Stand Pivot Transfers  3: Mod assist    Stand Pivot Transfer Details (indicate cue type and reason)  without AFO.  Continued verbal cues for sequencing, for full weight shift to R and assistance required to advance LLE      Therapeutic Activites    Therapeutic Activities  Other Therapeutic Activities    Other Therapeutic Activities  With orthotist present performed supine assessment of true leg length and then transitioned to standing to assess tolerance to 1 inch lift on R side.  Pt reported feeling that RLE was too long with 1 inch lift; transitioned to 3/4 inch which pt reported increased comfort.  Decided to add 3/4 inch lift to outside of R shoe and then have 1/4 insert to allow pt to work into full inch lift gradually.  Pt and daughter agreeable.      Neuro Re-ed    Neuro Re-ed Details   In standing to facilitate increased weight shift to R.  Therapist provided support on L side and cued pt to reach up, forwards and to the R for daughter's hand  (as target) and maintain R weight shift while performing 1-2 reps kicking block in front of L foot as visual target for advancing LLE.  Performed 3 sets with 2 seated rest breaks due to fatigue.             PT Education - 01/30/18 1307    Education Details  shoe lift, address of [facility] to drop R shoe off at    [employer]) Educated  Patient;Child(ren)    Methods  Explanation    Comprehension  Verbalized understanding       PT Short Term Goals - 01/23/18 0959      PT SHORT TERM GOAL #1   Title  Pt will be IND in HEP to improve the deficits listed above. TARGET DATE FOR ALL STGS: 01/11/18    Status  Partially Met      PT SHORT TERM GOAL #2   Title  Pt will be able to standing without UE support and feet apart for 2 minutes, with S, to perform ADLs safely.     Status  Partially Met      PT SHORT TERM GOAL #3   Title  Perform TUG, gait speed, and BERG-write STGs/LTGs, once tolerated.     Status  Partially Met      PT SHORT TERM GOAL #4   Title  Pt will amb. 100' with LRAD, over even terrain, at MOD I level to improve functional mobility.     Status  Not Met        PT Long Term Goals - 01/23/18 0959      PT LONG TERM GOAL #1   Title  Pt will verbalize understanding of CVA risk factors and s/s of CVA to reduce risk of additional CVA. TARGET DATE FOR ALL LTGS: 02/08/18    Status  New      PT LONG TERM GOAL #2   Title  Pt will amb. 200' outdoors, with LRAD, with S, in order to walk in his yard safely.     Status  New      PT LONG TERM GOAL #3   Title  Trial manual w/c outdoors and write goal as indicated.     Status  New      PT LONG TERM GOAL #4   Title  Pt will improve BERG score to >/=  20/56 to decr. falls risk.     Status  New            Plan - 01/30/18 1308    Clinical Impression Statement  Treatment session continued to focus on stand pivot transfers with RW, bed mobility and assessment of leg length discrepency and options for R shoe lift in  conjuction with orthotist.  Daughter to drop off R shoe to [facility].  Pt will be able to continue to use L shoe with AFO in therapy.  Continued NMR focusing on active R weight shift and activation of LLE hip flexion<>extension for advancement.  Will continue to address in order to progress towards LTG.     Rehab Potential  Good    Clinical Impairments Affecting Rehab Potential  left side weakness; poor left side motor control    PT Frequency  2x / week    PT Duration  8 weeks    PT Treatment/Interventions  ADLs/Self Care Home Management;Biofeedback;Canalith Repostioning;Electrical Stimulation;Therapeutic activities;Therapeutic exercise;Manual techniques;Vestibular;Wheelchair mobility training;Functional mobility training;Orthotic Fit/Training;Stair training;Gait training;Patient/family education;DME Instruction;Neuromuscular re-education;Balance training    PT Next Visit Plan  NMR: focus on active weight shift to R, use visual target to stimulate L foot advancement (kick block, bean bag, etc).  cont to work on transfers (STS and stand pivot with RW) ;make sure he is getting weight shift correct;  scifit was good tool for recipriocal mvt and UE involvement; he wants to try to get all the way around the gym with (RW with L h/o)    Recommended Other Services  He is getting a 3/4 lift added to R shoe; daughter to take shoe to [facility]    Consulted and Agree with Plan of Care  Patient;Family member/caregiver    Family Member Consulted  daughter       Patient will benefit from skilled therapeutic intervention in order to improve the following deficits and impairments:  Abnormal gait, Decreased endurance, Decreased knowledge of use of DME, Decreased strength, Impaired UE functional use, Decreased balance, Decreased mobility, Decreased range of motion, Decreased coordination, Impaired flexibility, Postural dysfunction  Visit Diagnosis: Muscle weakness (generalized)  Other abnormalities of gait and  mobility  Unsteadiness on feet     Problem List Patient Active Problem List   Diagnosis Date Noted  . 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 Acute blood loss anemia   . Chronic diastolic (congestive) heart failure ([place])   . Hemiparesis affecting left side as late effect of cerebrovascular accident ([place])   . Diabetes mellitus type 2 in obese ([place])   . Small vessel disease ([place]) 08/11/2017  . Left hemiparesis ([place])   . Benign essential hypertension with delivery   . CVA (cerebral vascular accident) ([place]) 08/10/2017  . 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 of localized onset, not intractable, without status epilepticus ([place]) 06/20/2017  . Primary osteoarthritis of both knees 05/11/2017  . Stroke (cerebrum) ([place]) 03/21/2017  . Type 2 diabetes mellitus with complication, with long-term current use of insulin ([place]) 03/27/2015  . Atherosclerosis of native coronary artery of native heart without angina pectoris 03/27/2015  . Palpitations 11/04/2014  . S/P CABG (coronary artery bypass graft) 11/04/2014  . Atrial fibrillation, unspecified   . History of stroke 09/04/2014  . Obesity (BMI 30-39.9) 11/29/2013  . History of gout 05/16/2013  . Congestive heart failure ([place]) 04/20/2009  . DYSPNEA 02/11/2009  . CARDIOVASCULAR FUNCTION STUDY, ABNORMAL 01/29/2009  . NECK PAIN 08/03/2007  . ANEMIA /  OTHER 05/16/2007  . Coronary atherosclerosis 05/16/2007  . PAROXYSMAL ATRIAL FIBRILLATION 05/16/2007  . Subcortical infarction ([place]) 05/16/2007  . Hyperlipidemia 03/30/2007  . DEGENERATIVE JOINT DISEASE 03/30/2007  . PAROTID LESION, UNSPECIFIED 09/29/2006  . Partial epilepsy with impairment of consciousness ([place]) 09/29/2006  . Diabetic polyneuropathy ([place]) 09/29/2006  . Essential hypertension 09/29/2006  . PERIPHERAL VASCULAR DISEASE 09/29/2006  . DIVERTICULOSIS 09/29/2006    Rico Junker, PT, DPT 01/30/18     1:14 PM    [facility] [address] [place], [place], [address] Phone: [phone]   Fax:  [phone]  Name: Fred [name] MRN: [id number] Date of Birth: 15-Apr-1935

## 2018-01-30 NOTE — Therapy (Signed)
[facility] [address] [place], [place], [address] Phone: [phone]   Fax:  [phone]  Occupational Therapy Treatment  Patient Details  Name: Fred [name] MRN: [id number] Date of Birth: [birth year] Referring Provider (OT): Dr. Alysia Penna   Encounter Date: 01/30/2018  OT End of Session - 01/30/18 1717    Visit Number  13    Number of Visits  24    Date for OT Re-Evaluation  03/08/18    Authorization Type  Medicare, Tricare, UHC - pt will need PN every 10th visit    Authorization Time Period  90 days    Authorization - Visit Number  65    Authorization - Number of Visits  20    OT Start Time  1325    OT Stop Time  1415    OT Time Calculation (min)  50 min    Activity Tolerance  Patient tolerated treatment well       Past Medical History:  Diagnosis Date  . Atrial fibrillation ([place])    Post operative  . CAD (coronary artery disease)    s/p cabg  . CHF (congestive heart failure) ([place])   . Diverticulosis   . DJD (degenerative joint disease)   . DM (diabetes mellitus) ([place])   . History of anemia   . History of stroke   . Hyperlipidemia   . Hypertension   . 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 Peripheral vascular disease ([place])   . Polyneuropathy, diabetic ([place])   . Seizure disorder, complex partial ([place])   . Syncope and collapse     Past Surgical History:  Procedure Laterality Date  . CATARACT EXTRACTION W/ INTRAOCULAR LENS  IMPLANT, BILATERAL  06/2013   Dr. [name]  . CORONARY ARTERY BYPASS GRAFT  02/13/2004   4 vesel  . removal of sebaceous cyst from neck  08/2007   Dr. [name]  . TONSILLECTOMY      There were no vitals filed for this visit.  Subjective Assessment - 01/30/18 1714    Subjective   I can move bettter in this water    Patient is accompained by:  Family member   dtr   Pertinent History  [name] 08/09/2017. DIscharged home on 09/22/2017 after inpt  rehab stay. Pt had HH PT and OT. PMH:      Patient Stated Goals  I want to get walking again.  I am an outdoor guy    Currently in Pain?  No/denies       Patient seen for aquatic therapy today.  Treatment took place in water 2.5-4 feet deep depending upon activity.  Pt entered the pool via ramp and water wheelchair.  Treatment focused on improving pt's ability to find midline in standing as well as to maintain midline with head turns and upper trunk rotation.  In static standing pt "pushes" on to weak L side, has difficulty knowing when LLE is active and rotates upper trunk to the left while hiking R shoulder and pushing with RUE.  Treatment first focused on pt's ability to know where in space he was - pt is able to state "I know I am on my left but it feels like when I move the right I am going to fall over."  Pt is able to actively weight shift to R however has difficulty maintaining midline or R shift in preparation for functional ambulation.  Also addressed postural alignment with back against pool wall  and using dumb bells for flotation support for UE's (therapist assisted with LUE) while actively rotation upper trunk on lower trunk to the right initially and then rotating and adding R weight shift through hips.  Addressed lateral weight shifts from midline to the right and returning just to midline.  Addressed functional ambulation forward and backward with RUE support and cues for pt to relax R shoulder and just use RUE as steadying assist.  Addressed RLE strengthening for knee flexion/extension as well as hip extension for RLE.  Pt exited pool via ramp and water wheelchair.                       OT Short Term Goals - 01/25/18 1208      OT SHORT TERM GOAL #1   Title  Pt and family will be mod I with basic home exercise/home activities program for LUE, balance and functional mobility - 01/25/2018    Status  On-going      OT SHORT TERM GOAL #2   Title  Pt will be supervision  with UB bathing    Status  Achieved   pt is able to do however wife continues to assist     OT SHORT TERM GOAL #3   Title  Pt will be min a for LB bathing    Status  On-going      OT SHORT TERM GOAL #4   Title  Pt will be close supervision for toilet transfers    Status  On-going      OT SHORT TERM GOAL #5   Title  Pt will be min a for tub bench transfers (assist with LLE)    Status  On-going      OT SHORT TERM GOAL #6   Title  Pt will tolerate shoulder flexion to 110* with no pain in supine in prep for HEP and low functional reach    Status  On-going      OT SHORT TERM GOAL #7   Title  Pt will demonstrate shoulder flexion to at least 30* in prep for low reach    Status  On-going      OT SHORT TERM GOAL #8   Title  Pt will demonstrate ability to hold light objects in L hand while manipulating with R hand (i.e opening containers) with min a    Status  On-going      OT SHORT TERM GOAL  #9   TITLE  Pt will be mod a for LB dressing    Status  On-going      OT SHORT TERM GOAL  #10   TITLE  Pt/family will be mod I with splint wear and care, prn    Status  On-going        OT Long Term Goals - 01/25/18 1208      OT LONG TERM GOAL #1   Title  Pt and family will be mod I with upgraded HEP/home activities program - 03/08/2018    Status  New      OT LONG TERM GOAL #2   Title  Pt will be min a for LB dressing    Status  New      OT LONG TERM GOAL #3   Title  Pt will be mod I with toilet transfers    Status  New      OT LONG TERM GOAL #4   Title  Pt will be mod I with clothing manipulation with toileting  activities    Status  New      OT LONG TERM GOAL #5   Title  Pt will be min a for simple familiar hot meal prep    Status  New      OT LONG TERM GOAL #6   Title  Pt will demonstate ability to use LUE as stabilzer at least 25% of the time during basic self care and fishing activities.    Status  New      OT LONG TERM GOAL #7   Title  Pt will be mod I with casting  simple fishing pole    Status  New      OT LONG TERM GOAL #8   Title  Pt will demonstate adequate postural alignment and control to complete simple activities in standing without UE support.     Status  New            Plan - 01/30/18 1715    Clinical Impression Statement  Pt with slow progress toward goals. Pt with inconsistent performance due to sensory, perceptual deficits    Occupational Profile and client history currently impacting functional performance  PMH: husband, father, grand father, retiree.  Pt loves the outdoors and fishing.  PMH: CAD, S/P CABG, DM, HTN, HLD, AFIB, CHF, seizure, PVD, obesity, ETHO abuse, TIA in 03/2017.      Occupational performance deficits (Please refer to evaluation for details):  ADL's;IADL's;Leisure;Social Participation;Rest and Sleep    Rehab Potential  Good    Current Impairments/barriers affecting progress:  medical history, severity of motor deficits, limited family suppor for carryover as pt's wife has cognitive decline as well    OT Frequency  2x / week    OT Duration  12 weeks    Plan  check STG's, check LB dressing and address futher education PRN, manual therapy to address LUE shouler girdle alignment, NMR for LUE, balance, functional mobility, ADL's,     Consulted and Agree with Plan of Care  Patient;Family member/caregiver    Family Member Consulted  wife and dtr       Patient will benefit from skilled therapeutic intervention in order to improve the following deficits and impairments:  Abnormal gait, Decreased balance, Decreased range of motion, Decreased mobility, Decreased knowledge of use of DME, Decreased strength, Difficulty walking, Impaired UE functional use, Impaired tone, Impaired flexibility, Pain  Visit Diagnosis: Muscle weakness (generalized)  Unsteadiness on feet  Abnormal posture  Spastic hemiplegia of left nondominant side as late effect of cerebral infarction (HCC)  Chronic left shoulder pain  Pain in left  wrist  Stiffness of left shoulder, not elsewhere classified  Stiffness of left wrist, not elsewhere classified    Problem List Patient Active Problem List   Diagnosis Date Noted  . 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 Acute blood loss anemia   . Chronic diastolic (congestive) heart failure ([place])   . Hemiparesis affecting left side as late effect of cerebrovascular accident ([place])   . Diabetes mellitus type 2 in obese ([place])   . Small vessel disease ([place]) 08/11/2017  . Left hemiparesis ([place])   . Benign essential hypertension with delivery   . CVA (cerebral vascular accident) ([place]) 08/10/2017  . 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 of localized onset, not intractable, without status epilepticus ([place]) 06/20/2017  . Primary osteoarthritis of both knees 05/11/2017  . Stroke (cerebrum) ([place]) 03/21/2017  . Type 2 diabetes mellitus with complication, with long-term current use  of insulin ([place]) 03/27/2015  . Atherosclerosis of native coronary artery of native heart without angina pectoris 03/27/2015  . Palpitations 11/04/2014  . S/P CABG (coronary artery bypass graft) 11/04/2014  . Atrial fibrillation, unspecified   . History of stroke 09/04/2014  . Obesity (BMI 30-39.9) 11/29/2013  . History of gout 05/16/2013  . Congestive heart failure ([place]) 04/20/2009  . DYSPNEA 02/11/2009  . CARDIOVASCULAR FUNCTION STUDY, ABNORMAL 01/29/2009  . NECK PAIN 08/03/2007  . ANEMIA / OTHER 05/16/2007  . Coronary atherosclerosis 05/16/2007  . PAROXYSMAL ATRIAL FIBRILLATION 05/16/2007  . Subcortical infarction ([place]) 05/16/2007  . Hyperlipidemia 03/30/2007  . DEGENERATIVE JOINT DISEASE 03/30/2007  . PAROTID LESION, UNSPECIFIED 09/29/2006  . Partial epilepsy with impairment of consciousness ([place]) 09/29/2006  . Diabetic polyneuropathy ([place]) 09/29/2006  . Essential hypertension 09/29/2006  . PERIPHERAL VASCULAR DISEASE  09/29/2006  . DIVERTICULOSIS 09/29/2006    Quay Burow, OTR/L 01/30/2018, 5:19 PM  [facility] [address] [place], [place], [address] Phone: [phone]   Fax:  [phone]  Name: Fred [name] MRN: [id number] Date of Birth: 1935-04-25

## 2018-02-01 ENCOUNTER — Encounter: Payer: Self-pay | Admitting: Occupational Therapy

## 2018-02-01 ENCOUNTER — Encounter: Payer: Self-pay | Admitting: Rehabilitation

## 2018-02-01 ENCOUNTER — Ambulatory Visit: Payer: Medicare Other | Admitting: Occupational Therapy

## 2018-02-01 ENCOUNTER — Ambulatory Visit: Payer: Medicare Other | Admitting: Rehabilitation

## 2018-02-01 DIAGNOSIS — M25532 Pain in left wrist: Secondary | ICD-10-CM

## 2018-02-01 DIAGNOSIS — M25512 Pain in left shoulder: Secondary | ICD-10-CM

## 2018-02-01 DIAGNOSIS — M25612 Stiffness of left shoulder, not elsewhere classified: Secondary | ICD-10-CM

## 2018-02-01 DIAGNOSIS — G8929 Other chronic pain: Secondary | ICD-10-CM | POA: Diagnosis not present

## 2018-02-01 DIAGNOSIS — I69354 Hemiplegia and hemiparesis following cerebral infarction affecting left non-dominant side: Secondary | ICD-10-CM

## 2018-02-01 DIAGNOSIS — R293 Abnormal posture: Secondary | ICD-10-CM | POA: Diagnosis not present

## 2018-02-01 DIAGNOSIS — M25632 Stiffness of left wrist, not elsewhere classified: Secondary | ICD-10-CM

## 2018-02-01 DIAGNOSIS — M6281 Muscle weakness (generalized): Secondary | ICD-10-CM

## 2018-02-01 DIAGNOSIS — R2681 Unsteadiness on feet: Secondary | ICD-10-CM | POA: Diagnosis not present

## 2018-02-01 DIAGNOSIS — R2689 Other abnormalities of gait and mobility: Secondary | ICD-10-CM

## 2018-02-01 NOTE — Therapy (Signed)
[facility] [address] [place], [place], [address] Phone: [phone]   Fax:  [phone]  Physical Therapy Treatment  Patient Details  Name: Fred [name] MRN: [id number] Date of Birth: 11/28/1935 Referring Provider (PT): Dr. Letta Pate   Encounter Date: 02/01/2018  PT End of Session - 02/01/18 1234    Visit Number  13    Number of Visits  17    Date for PT Re-Evaluation  02/12/18    Authorization Type  Medicare, UHC, Tricare (no PTAs). Progress note every 10th visit.     PT Start Time  1103    PT Stop Time  1145    PT Time Calculation (min)  42 min    Equipment Utilized During Treatment  --   mod A -S prn   Activity Tolerance  Patient tolerated treatment well    Behavior During Therapy  WFL for tasks assessed/performed       Past Medical History:  Diagnosis Date  . Atrial fibrillation ([place])    Post operative  . CAD (coronary artery disease)    s/p cabg  . CHF (congestive heart failure) ([place])   . Diverticulosis   . DJD (degenerative joint disease)   . DM (diabetes mellitus) ([place])   . History of anemia   . History of stroke   . Hyperlipidemia   . Hypertension   . 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 Peripheral vascular disease ([place])   . Polyneuropathy, diabetic ([place])   . Seizure disorder, complex partial ([place])   . Syncope and collapse     Past Surgical History:  Procedure Laterality Date  . CATARACT EXTRACTION W/ INTRAOCULAR LENS  IMPLANT, BILATERAL  06/2013   Dr. [name]  . CORONARY ARTERY BYPASS GRAFT  02/13/2004   4 vesel  . removal of sebaceous cyst from neck  08/2007   Dr. [name]  . TONSILLECTOMY      There were no vitals filed for this visit.  Subjective Assessment - 02/01/18 1025    Subjective  Pt reports injection has helped knee a lot.  Shoe is getting modified.     Pertinent History  DM with polyneuropathy, hx of cataracts, hx of CVA in 03/2017 and again  07/2017, OA in R knee, DJD, HTN, HLD, coronary atherosclerosis, a-fib, CHF, PVD, parotid lesion, diverticulitis, partial epilepsy, anemia    Patient Stated Goals  get stronger; able to walk and transfer better    Currently in Pain?  No/denies                      Gait Training:  Assisted pt don L shoe and AFO during session as he arrived in B slip on shoes.  Initially added 30m lift to R shoe, however upon first bout of gait x 5-7' note that there was still marked difficulty clearance LLE even with adequate R lateral weight shift, therefore added 15 mm shoe lift to R shoe and continued with gait.  Note MARKED improvement today in pts ability to shift to the R and clear LLE on his own.  Provided light tactile cues at R hip for improved R lateral weight shift and also light support at rib cage to facilitate upright posture and forward lateral weight shift over LLE.  Pt needs continuous cues for LLE activation during L stance, but is able to activate very well once cued.  Pt ambulated x 7', x 30', and x 24' during session.  Note that during last bout of gait, PT able to utilize mirror at back of gym for visual feedback on posture.  Pt did well but needed cues to look up at mirror during gait. Pt did c/o R knee pain following gait, however doesn't seem as painful as it was before knee injection.           PT Education - 02/01/18 1233    Education Details  working on standing at home with use of simulated shoe lift as able    [employer]) Educated  Patient;Child(ren)    Methods  Explanation    Comprehension  Verbalized understanding       PT Short Term Goals - 01/23/18 0959      PT SHORT TERM GOAL #1   Title  Pt will be IND in HEP to improve the deficits listed above. TARGET DATE FOR ALL STGS: 01/11/18    Status  Partially Met      PT SHORT TERM GOAL #2   Title  Pt will be able to standing without UE support and feet apart for 2 minutes, with S, to perform ADLs safely.      Status  Partially Met      PT SHORT TERM GOAL #3   Title  Perform TUG, gait speed, and BERG-write STGs/LTGs, once tolerated.     Status  Partially Met      PT SHORT TERM GOAL #4   Title  Pt will amb. 100' with LRAD, over even terrain, at MOD I level to improve functional mobility.     Status  Not Met        PT Long Term Goals - 01/23/18 0959      PT LONG TERM GOAL #1   Title  Pt will verbalize understanding of CVA risk factors and s/s of CVA to reduce risk of additional CVA. TARGET DATE FOR ALL LTGS: 02/08/18    Status  New      PT LONG TERM GOAL #2   Title  Pt will amb. 200' outdoors, with LRAD, with S, in order to walk in his yard safely.     Status  New      PT LONG TERM GOAL #3   Title  Trial manual w/c outdoors and write goal as indicated.     Status  New      PT LONG TERM GOAL #4   Title  Pt will improve BERG score to >/=20/56 to decr. falls risk.     Status  New            Plan - 02/01/18 1234    Clinical Impression Statement  Skilled session focused on gait training with use of RW, L hand orthosis, L AFO, simulated shoe lift on R side (52m) in order to continue to address postural control and perceptual deficits.  Pt did much better today increasing R lateral weight shift for L foot clearance.      Rehab Potential  Good    Clinical Impairments Affecting Rehab Potential  left side weakness; poor left side motor control    PT Frequency  2x / week    PT Duration  8 weeks    PT Treatment/Interventions  ADLs/Self Care Home Management;Biofeedback;Canalith Repostioning;Electrical Stimulation;Therapeutic activities;Therapeutic exercise;Manual techniques;Vestibular;Wheelchair mobility training;Functional mobility training;Orthotic Fit/Training;Stair training;Gait training;Patient/family education;DME Instruction;Neuromuscular re-education;Balance training    PT Next Visit Plan  (Goals due 11/28)(If he doesn't  have his lift yet, use 15 mm lift on RLE) NMR: focus on active  weight shift to R, use visual target to stimulate L foot advancement (kick block, bean bag, etc).  cont to work on transfers (STS and stand pivot with RW) ;make sure he is getting weight shift correct;  scifit was good tool for recipriocal mvt and UE involvement; he wants to try to get all the way around the gym with (RW with L h/o)    Consulted and Agree with Plan of Care  Patient;Family member/caregiver    Family Member Consulted  daughter       Patient will benefit from skilled therapeutic intervention in order to improve the following deficits and impairments:  Abnormal gait, Decreased endurance, Decreased knowledge of use of DME, Decreased strength, Impaired UE functional use, Decreased balance, Decreased mobility, Decreased range of motion, Decreased coordination, Impaired flexibility, Postural dysfunction  Visit Diagnosis: Muscle weakness (generalized)  Hemiplegia and hemiparesis following cerebral infarction affecting left non-dominant side (HCC)  Unsteadiness on feet  Other abnormalities of gait and mobility     Problem List Patient Active Problem List   Diagnosis Date Noted  . 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 Acute blood loss anemia   . Chronic diastolic (congestive) heart failure ([place])   . Hemiparesis affecting left side as late effect of cerebrovascular accident ([place])   . Diabetes mellitus type 2 in obese ([place])   . Small vessel disease ([place]) 08/11/2017  . Left hemiparesis ([place])   . Benign essential hypertension with delivery   . CVA (cerebral vascular accident) ([place]) 08/10/2017  . 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 of localized onset, not intractable, without status epilepticus ([place]) 06/20/2017  . Primary osteoarthritis of both knees 05/11/2017  . Stroke (cerebrum) ([place]) 03/21/2017  . Type 2 diabetes mellitus with complication, with long-term current use of insulin ([place])  03/27/2015  . Atherosclerosis of native coronary artery of native heart without angina pectoris 03/27/2015  . Palpitations 11/04/2014  . S/P CABG (coronary artery bypass graft) 11/04/2014  . Atrial fibrillation, unspecified   . History of stroke 09/04/2014  . Obesity (BMI 30-39.9) 11/29/2013  . History of gout 05/16/2013  . Congestive heart failure ([place]) 04/20/2009  . DYSPNEA 02/11/2009  . CARDIOVASCULAR FUNCTION STUDY, ABNORMAL 01/29/2009  . NECK PAIN 08/03/2007  . ANEMIA / OTHER 05/16/2007  . Coronary atherosclerosis 05/16/2007  . PAROXYSMAL ATRIAL FIBRILLATION 05/16/2007  . Subcortical infarction ([place]) 05/16/2007  . Hyperlipidemia 03/30/2007  . DEGENERATIVE JOINT DISEASE 03/30/2007  . PAROTID LESION, UNSPECIFIED 09/29/2006  . Partial epilepsy with impairment of consciousness ([place]) 09/29/2006  . Diabetic polyneuropathy ([place]) 09/29/2006  . Essential hypertension 09/29/2006  . PERIPHERAL VASCULAR DISEASE 09/29/2006  . DIVERTICULOSIS 09/29/2006    Cameron Sprang, PT, MPT [facility] [address] [place], [place], [address] Phone: [phone]   Fax:  [phone] 02/01/18, 12:44 PM  Name: Fred [name] MRN: [id number] Date of Birth: May 20, 1935

## 2018-02-01 NOTE — Therapy (Signed)
[facility] [address] [place], [place], [address] Phone: [phone]   Fax:  [phone]  Occupational Therapy Treatment  Patient Details  Name: Fred [name] MRN: [id number] Date of Birth: 31-May-1935 Referring Provider (OT): Dr. Alysia Penna   Encounter Date: 02/01/2018  OT End of Session - 02/01/18 1204    Visit Number  14    Number of Visits  24    Date for OT Re-Evaluation  03/08/18    Authorization Type  Medicare, Tricare, [employer] - pt will need PN every 10th visit    Authorization Time Period  90 days    Authorization - Visit Number  58    Authorization - Number of Visits  20    OT Start Time  1102    OT Stop Time  1145    OT Time Calculation (min)  43 min    Activity Tolerance  Patient tolerated treatment well       Past Medical History:  Diagnosis Date  . Atrial fibrillation ([place])    Post operative  . CAD (coronary artery disease)    s/p cabg  . CHF (congestive heart failure) ([place])   . Diverticulosis   . DJD (degenerative joint disease)   . DM (diabetes mellitus) ([place])   . History of anemia   . History of stroke   . Hyperlipidemia   . Hypertension   . 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 Peripheral vascular disease ([place])   . Polyneuropathy, diabetic ([place])   . Seizure disorder, complex partial ([place])   . Syncope and collapse     Past Surgical History:  Procedure Laterality Date  . CATARACT EXTRACTION W/ INTRAOCULAR LENS  IMPLANT, BILATERAL  06/2013   Dr. [name]  . CORONARY ARTERY BYPASS GRAFT  02/13/2004   4 vesel  . removal of sebaceous cyst from neck  08/2007   Dr. [name]  . TONSILLECTOMY      There were no vitals filed for this visit.  Subjective Assessment - 02/01/18 1109    Patient is accompained by:  Family member  (Pended)    wife and son in law   Pertinent History  [name] 08/09/2017. DIscharged home on 09/22/2017 after inpt rehab stay. Pt had HH PT  and OT. PMH:    (Pended)     Patient Stated Goals  I want to get walking again.  I am an outdoor guy  (Pended)     Currently in Pain?  Yes  (Pended)     Pain Score  5   (Pended)     Pain Location  Knee  (Pended)     Pain Orientation  Right  (Pended)     Pain Descriptors / Indicators  Sharp  (Pended)     Pain Type  Chronic pain  (Pended)     Pain Onset  More than a month ago  (Pended)     Pain Frequency  Intermittent  (Pended)     Aggravating Factors   walking, standing up   (Pended)                    OT Treatments/Exercises (OP) - 02/01/18 0001      ADLs   ADL Comments  Assessed STG's - see goal section for updates.        Neurological Re-education Exercises   Other Exercises 1  Neuro re ed to address sit to stand, static and dynamic standing balance  and functiona ambulation with RW and L hand orthosis - emphasis on postural alignment and control .  Pt benefits from use of mirror for feedback. Also addressed LUE isolated movement for elbow flexion/extension - pt has active movement however needs max cues to fully engage activity due to L neglect and poor sensation.                 OT Short Term Goals - 02/01/18 1157      OT SHORT TERM GOAL #1   Title  Pt and family will be mod I with basic home exercise/home activities program for LUE, balance and functional mobility - 01/25/2018    Status  Achieved      OT SHORT TERM GOAL #2   Title  Pt will be supervision with UB bathing    Status  Achieved   pt is able to do however wife continues to assist     OT SHORT TERM GOAL #3   Title  Pt will be min a for LB bathing    Status  Achieved      OT SHORT TERM GOAL #4   Title  Pt will be close supervision for toilet transfers    Status  Achieved      OT SHORT TERM GOAL #5   Title  Pt will be min a for tub bench transfers (assist with LLE)    Status  Achieved      OT SHORT TERM GOAL #6   Title  Pt will tolerate shoulder flexion to 110* with no pain in supine in  prep for HEP and low functional reach    Status  Achieved      OT SHORT TERM GOAL #7   Title  Pt will demonstrate shoulder flexion to at least 30* in prep for low reach    Status  On-going      OT SHORT TERM GOAL #8   Title  Pt will demonstrate ability to hold light objects in L hand while manipulating with R hand (i.e opening containers) with min a    Status  On-going      OT SHORT TERM GOAL  #9   TITLE  Pt will be mod a for LB dressing    Status  Achieved      OT SHORT TERM GOAL  #10   TITLE  Pt/family will be mod I with splint wear and care, prn    Status  Achieved        OT Long Term Goals - 02/01/18 1157      OT LONG TERM GOAL #1   Title  Pt and family will be mod I with upgraded HEP/home activities program - 03/08/2018    Status  On-going      OT LONG TERM GOAL #2   Title  Pt will be min a for LB dressing    Status  On-going      OT LONG TERM GOAL #3   Title  Pt will be mod I with toilet transfers    Status  On-going      OT LONG TERM GOAL #4   Title  Pt will be mod I with clothing manipulation with toileting activities    Status  On-going      OT LONG TERM GOAL #5   Title  Pt will be min a for simple familiar hot meal prep    Status  On-going      OT LONG TERM GOAL #6  Title  Pt will demonstate ability to use LUE as stabilzer at least 25% of the time during basic self care and fishing activities.    Status  On-going      OT LONG TERM GOAL #7   Title  Pt will be mod I with casting simple fishing pole    Status  On-going      OT LONG TERM GOAL #8   Title  Pt will demonstate adequate postural alignment and control to complete simple activities in standing without UE support.     Status  On-going            Plan - 02/01/18 1203    Clinical Impression Statement  Pt progressing toward goals - pt has met all but 2 STG's.  Pt with improving sitting balance as well as transfers and postural alignment.     Occupational Profile and client history  currently impacting functional performance  PMH: husband, father, grand father, retiree.  Pt loves the outdoors and fishing.  PMH: CAD, S/P CABG, DM, HTN, HLD, AFIB, CHF, seizure, PVD, obesity, ETHO abuse, TIA in 03/2017.      Occupational performance deficits (Please refer to evaluation for details):  ADL's;IADL's;Leisure;Social Participation;Rest and Sleep    Rehab Potential  Good    Current Impairments/barriers affecting progress:  medical history, severity of motor deficits, limited family suppor for carryover as pt's wife has cognitive decline as well    OT Frequency  2x / week    OT Duration  12 weeks    OT Treatment/Interventions  Self-care/ADL training;Aquatic Therapy;Electrical Stimulation;Moist Heat;Fluidtherapy;Ultrasound;Therapeutic exercise;Neuromuscular education;Energy conservation;DME and/or AE instruction;Passive range of motion;Manual Therapy;Functional Mobility Training;Splinting;Therapeutic activities;Patient/family education;Balance training    Plan  address LB dressing and address futher education PRN, manual therapy to address LUE shouler girdle alignment, NMR for LUE, balance, functional mobility, ADL's,     Consulted and Agree with Plan of Care  Patient;Family member/caregiver    Family Member Consulted  wife and son in law       Patient will benefit from skilled therapeutic intervention in order to improve the following deficits and impairments:  Abnormal gait, Decreased balance, Decreased range of motion, Decreased mobility, Decreased knowledge of use of DME, Decreased strength, Difficulty walking, Impaired UE functional use, Impaired tone, Impaired flexibility, Pain  Visit Diagnosis: Muscle weakness (generalized)  Unsteadiness on feet  Abnormal posture  Spastic hemiplegia of left nondominant side as late effect of cerebral infarction (HCC)  Chronic left shoulder pain  Pain in left wrist  Stiffness of left shoulder, not elsewhere classified  Stiffness of left  wrist, not elsewhere classified    Problem List Patient Active Problem List   Diagnosis Date Noted  . 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 Acute blood loss anemia   . Chronic diastolic (congestive) heart failure ([place])   . Hemiparesis affecting left side as late effect of cerebrovascular accident ([place])   . Diabetes mellitus type 2 in obese ([place])   . Small vessel disease ([place]) 08/11/2017  . Left hemiparesis ([place])   . Benign essential hypertension with delivery   . CVA (cerebral vascular accident) ([place]) 08/10/2017  . 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 of localized onset, not intractable, without status epilepticus ([place]) 06/20/2017  . Primary osteoarthritis of both knees 05/11/2017  . Stroke (cerebrum) ([place]) 03/21/2017  . Type 2 diabetes mellitus with complication, with long-term current use of insulin ([place]) 03/27/2015  . Atherosclerosis of native coronary artery of native  heart without angina pectoris 03/27/2015  . Palpitations 11/04/2014  . S/P CABG (coronary artery bypass graft) 11/04/2014  . Atrial fibrillation, unspecified   . History of stroke 09/04/2014  . Obesity (BMI 30-39.9) 11/29/2013  . History of gout 05/16/2013  . Congestive heart failure ([place]) 04/20/2009  . DYSPNEA 02/11/2009  . CARDIOVASCULAR FUNCTION STUDY, ABNORMAL 01/29/2009  . NECK PAIN 08/03/2007  . ANEMIA / OTHER 05/16/2007  . Coronary atherosclerosis 05/16/2007  . PAROXYSMAL ATRIAL FIBRILLATION 05/16/2007  . Subcortical infarction ([place]) 05/16/2007  . Hyperlipidemia 03/30/2007  . DEGENERATIVE JOINT DISEASE 03/30/2007  . PAROTID LESION, UNSPECIFIED 09/29/2006  . Partial epilepsy with impairment of consciousness ([place]) 09/29/2006  . Diabetic polyneuropathy ([place]) 09/29/2006  . Essential hypertension 09/29/2006  . PERIPHERAL VASCULAR DISEASE 09/29/2006  . DIVERTICULOSIS 09/29/2006    Quay Burow,  OTR/L 02/01/2018, 12:05 PM  [facility] [address] [place], [place], [address] Phone: [phone]   Fax:  [phone]  Name: Fred [name] MRN: [id number] Date of Birth: April 26, 1935

## 2018-02-05 ENCOUNTER — Ambulatory Visit: Payer: Medicare Other | Admitting: Occupational Therapy

## 2018-02-05 ENCOUNTER — Ambulatory Visit: Payer: Medicare Other | Admitting: Rehabilitative and Restorative Service Providers"

## 2018-02-06 ENCOUNTER — Encounter: Payer: Self-pay | Admitting: Physical Therapy

## 2018-02-06 ENCOUNTER — Ambulatory Visit: Payer: Medicare Other | Admitting: Physical Therapy

## 2018-02-06 ENCOUNTER — Ambulatory Visit: Payer: Medicare Other | Admitting: Occupational Therapy

## 2018-02-06 DIAGNOSIS — I69354 Hemiplegia and hemiparesis following cerebral infarction affecting left non-dominant side: Secondary | ICD-10-CM

## 2018-02-06 DIAGNOSIS — M6281 Muscle weakness (generalized): Secondary | ICD-10-CM | POA: Diagnosis not present

## 2018-02-06 DIAGNOSIS — R2681 Unsteadiness on feet: Secondary | ICD-10-CM

## 2018-02-06 DIAGNOSIS — M25512 Pain in left shoulder: Secondary | ICD-10-CM | POA: Diagnosis not present

## 2018-02-06 DIAGNOSIS — G8929 Other chronic pain: Secondary | ICD-10-CM | POA: Diagnosis not present

## 2018-02-06 DIAGNOSIS — R2689 Other abnormalities of gait and mobility: Secondary | ICD-10-CM

## 2018-02-06 DIAGNOSIS — R293 Abnormal posture: Secondary | ICD-10-CM | POA: Diagnosis not present

## 2018-02-06 NOTE — Therapy (Signed)
[facility] [address] [place], [place], [address] Phone: [phone]   Fax:  [phone]  Occupational Therapy Treatment  Patient Details  Name: Fred [name] MRN: [id number] Date of Birth: [birth year] Referring Provider (OT): Dr. Alysia Penna   Encounter Date: 02/06/2018  OT End of Session - 02/06/18 1236    Visit Number  15    Number of Visits  24    Date for OT Re-Evaluation  03/08/18    Authorization Type  Medicare, Tricare, UHC - pt will need PN every 10th visit    Authorization Time Period  90 days    Authorization - Visit Number  15    Authorization - Number of Visits  20    OT Start Time  0930    OT Stop Time  1015    OT Time Calculation (min)  45 min    Activity Tolerance  Patient tolerated treatment well    Behavior During Therapy  [facility] for tasks assessed/performed       Past Medical History:  Diagnosis Date  . Atrial fibrillation ([place])    Post operative  . CAD (coronary artery disease)    s/p cabg  . CHF (congestive heart failure) ([place])   . Diverticulosis   . DJD (degenerative joint disease)   . DM (diabetes mellitus) ([place])   . History of anemia   . History of stroke   . Hyperlipidemia   . Hypertension   . 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 Peripheral vascular disease ([place])   . Polyneuropathy, diabetic ([place])   . Seizure disorder, complex partial ([place])   . Syncope and collapse     Past Surgical History:  Procedure Laterality Date  . CATARACT EXTRACTION W/ INTRAOCULAR LENS  IMPLANT, BILATERAL  06/2013   Dr. [name]  . CORONARY ARTERY BYPASS GRAFT  02/13/2004   4 vesel  . removal of sebaceous cyst from neck  08/2007   Dr. [name]  . TONSILLECTOMY      There were no vitals filed for this visit.  Subjective Assessment - 02/06/18 1230    Subjective   I usually just get my wife to put on my shoes and socks    Patient is accompained by:  Family member    son?   Pertinent History  [name] 08/09/2017. DIscharged home on 09/22/2017 after inpt rehab stay. Pt had HH PT and OT. PMH:      Patient Stated Goals  I want to get walking again.  I am an outdoor guy    Currently in Pain?  No/denies                   OT Treatments/Exercises (OP) - 02/06/18 0001      ADLs   LB Dressing  Pt doffed Rt shoe and sock I'ly, but required stool to don Rt sock. Pt doffed Lt shoe and sock w/ difficulty and extra time and min assist. Pt donned Lt sock w/ min assist, and Lt shoe w/ mod assist using stool to help prn. Pt did best with stool    ADL Comments  Pt simulated pulling up pants in standing but unable to fully don over hips d/t standing balance. Pt required assist to get fully over buttocks. Pt doffed over feet seated I'ly               OT Short Term Goals - 02/01/18 1157      OT  SHORT TERM GOAL #1   Title  Pt and family will be mod I with basic home exercise/home activities program for LUE, balance and functional mobility - 01/25/2018    Status  Achieved      OT SHORT TERM GOAL #2   Title  Pt will be supervision with UB bathing    Status  Achieved   pt is able to do however wife continues to assist     OT SHORT TERM GOAL #3   Title  Pt will be min a for LB bathing    Status  Achieved      OT SHORT TERM GOAL #4   Title  Pt will be close supervision for toilet transfers    Status  Achieved      OT SHORT TERM GOAL #5   Title  Pt will be min a for tub bench transfers (assist with LLE)    Status  Achieved      OT SHORT TERM GOAL #6   Title  Pt will tolerate shoulder flexion to 110* with no pain in supine in prep for HEP and low functional reach    Status  Achieved      OT SHORT TERM GOAL #7   Title  Pt will demonstrate shoulder flexion to at least 30* in prep for low reach    Status  On-going      OT SHORT TERM GOAL #8   Title  Pt will demonstrate ability to hold light objects in L hand while manipulating with R hand (i.e  opening containers) with min a    Status  On-going      OT SHORT TERM GOAL  #9   TITLE  Pt will be mod a for LB dressing    Status  Achieved      OT SHORT TERM GOAL  #10   TITLE  Pt/family will be mod I with splint wear and care, prn    Status  Achieved        OT Long Term Goals - 02/01/18 1157      OT LONG TERM GOAL #1   Title  Pt and family will be mod I with upgraded HEP/home activities program - 03/08/2018    Status  On-going      OT LONG TERM GOAL #2   Title  Pt will be min a for LB dressing    Status  On-going      OT LONG TERM GOAL #3   Title  Pt will be mod I with toilet transfers    Status  On-going      OT LONG TERM GOAL #4   Title  Pt will be mod I with clothing manipulation with toileting activities    Status  On-going      OT LONG TERM GOAL #5   Title  Pt will be min a for simple familiar hot meal prep    Status  On-going      OT LONG TERM GOAL #6   Title  Pt will demonstate ability to use LUE as stabilzer at least 25% of the time during basic self care and fishing activities.    Status  On-going      OT LONG TERM GOAL #7   Title  Pt will be mod I with casting simple fishing pole    Status  On-going      OT LONG TERM GOAL #8   Title  Pt will demonstate adequate postural alignment and control to  complete simple activities in standing without UE support.     Status  On-going            Plan - 02/06/18 1237    Clinical Impression Statement  Pt progressing with LE dressing seated using stool. Pt still needs assist to don Lt shoe and sock.     Occupational Profile and client history currently impacting functional performance  PMH: husband, father, grand father, retiree.  Pt loves the outdoors and fishing.  PMH: CAD, S/P CABG, DM, HTN, HLD, AFIB, CHF, seizure, PVD, obesity, ETHO abuse, TIA in 03/2017.      Occupational performance deficits (Please refer to evaluation for details):  ADL's;IADL's;Leisure;Social Participation;Rest and Sleep    Rehab  Potential  Good    Current Impairments/barriers affecting progress:  medical history, severity of motor deficits, limited family suppor for carryover as pt's wife has cognitive decline as well    OT Frequency  2x / week    OT Duration  12 weeks    OT Treatment/Interventions  Self-care/ADL training;Aquatic Therapy;Electrical Stimulation;Moist Heat;Fluidtherapy;Ultrasound;Therapeutic exercise;Neuromuscular education;Energy conservation;DME and/or AE instruction;Passive range of motion;Manual Therapy;Functional Mobility Training;Splinting;Therapeutic activities;Patient/family education;Balance training    Plan  manual therapy to address LUE shouler girdle alignment, NMR for LUE, balance, functional mobility, ADL's,     Consulted and Agree with Plan of Care  Patient;Family member/caregiver       Patient will benefit from skilled therapeutic intervention in order to improve the following deficits and impairments:  Abnormal gait, Decreased balance, Decreased range of motion, Decreased mobility, Decreased knowledge of use of DME, Decreased strength, Difficulty walking, Impaired UE functional use, Impaired tone, Impaired flexibility, Pain  Visit Diagnosis: Hemiplegia and hemiparesis following cerebral infarction affecting left non-dominant side (HCC)  Unsteadiness on feet    Problem List Patient Active Problem List   Diagnosis Date Noted  . 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 Acute blood loss anemia   . Chronic diastolic (congestive) heart failure ([place])   . Hemiparesis affecting left side as late effect of cerebrovascular accident ([place])   . Diabetes mellitus type 2 in obese ([place])   . Small vessel disease ([place]) 08/11/2017  . Left hemiparesis ([place])   . Benign essential hypertension with delivery   . CVA (cerebral vascular accident) ([place]) 08/10/2017  . 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 of localized onset, not  intractable, without status epilepticus ([place]) 06/20/2017  . Primary osteoarthritis of both knees 05/11/2017  . Stroke (cerebrum) ([place]) 03/21/2017  . Type 2 diabetes mellitus with complication, with long-term current use of insulin ([place]) 03/27/2015  . Atherosclerosis of native coronary artery of native heart without angina pectoris 03/27/2015  . Palpitations 11/04/2014  . S/P CABG (coronary artery bypass graft) 11/04/2014  . Atrial fibrillation, unspecified   . History of stroke 09/04/2014  . Obesity (BMI 30-39.9) 11/29/2013  . History of gout 05/16/2013  . Congestive heart failure ([place]) 04/20/2009  . DYSPNEA 02/11/2009  . CARDIOVASCULAR FUNCTION STUDY, ABNORMAL 01/29/2009  . NECK PAIN 08/03/2007  . ANEMIA / OTHER 05/16/2007  . Coronary atherosclerosis 05/16/2007  . PAROXYSMAL ATRIAL FIBRILLATION 05/16/2007  . Subcortical infarction ([place]) 05/16/2007  . Hyperlipidemia 03/30/2007  . DEGENERATIVE JOINT DISEASE 03/30/2007  . PAROTID LESION, UNSPECIFIED 09/29/2006  . Partial epilepsy with impairment of consciousness ([place]) 09/29/2006  . Diabetic polyneuropathy ([place]) 09/29/2006  . Essential hypertension 09/29/2006  . PERIPHERAL VASCULAR DISEASE 09/29/2006  . DIVERTICULOSIS 09/29/2006    Carey Bullocks, OTR/L 02/06/2018, 12:38 PM  [facility] [address] [place], [place], [address] Phone: [phone]   Fax:  [phone]  Name: Fred [name] MRN: [id number] Date of Birth: 11-18-1935

## 2018-02-06 NOTE — Therapy (Signed)
[facility] [address] [place], [place], [address] Phone: [phone]   Fax:  [phone]  Physical Therapy Treatment  Patient Details  Name: Fred [name] MRN: [id number] Date of Birth: [birth year] Referring Provider (PT): Dr. Letta Pate   Encounter Date: 02/06/2018  PT End of Session - 02/06/18 1257    Visit Number  14    Number of Visits  17    Date for PT Re-Evaluation  02/12/18    Authorization Type  Medicare, UHC, Tricare (no PTAs). Progress note every 10th visit.     PT Start Time  1015    PT Stop Time  1100    PT Time Calculation (min)  45 min    Equipment Utilized During Treatment  --   mod A -S prn   Activity Tolerance  Patient tolerated treatment well    Behavior During Therapy  WFL for tasks assessed/performed       Past Medical History:  Diagnosis Date  . Atrial fibrillation ([place])    Post operative  . CAD (coronary artery disease)    s/p cabg  . CHF (congestive heart failure) ([place])   . Diverticulosis   . DJD (degenerative joint disease)   . DM (diabetes mellitus) ([place])   . History of anemia   . History of stroke   . Hyperlipidemia   . Hypertension   . 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 Peripheral vascular disease ([place])   . Polyneuropathy, diabetic ([place])   . Seizure disorder, complex partial ([place])   . Syncope and collapse     Past Surgical History:  Procedure Laterality Date  . CATARACT EXTRACTION W/ INTRAOCULAR LENS  IMPLANT, BILATERAL  06/2013   Dr. [name]  . CORONARY ARTERY BYPASS GRAFT  02/13/2004   4 vesel  . removal of sebaceous cyst from neck  08/2007   Dr. [name]  . TONSILLECTOMY      There were no vitals filed for this visit.  Subjective Assessment - 02/06/18 1024    Subjective  Knee is still feeling better, goes back in a month to have his knee checked.  Still wearing Evenup on R shoe to improve leg length.  Motorized wheelchair that was  ordered for him comes at 2pm.  Has a lift on the back of his truck and will bring the power w/c next week.  Has been using his wife's power scooter.    Pertinent History  DM with polyneuropathy, hx of cataracts, hx of CVA in 03/2017 and again 07/2017, OA in R knee, DJD, HTN, HLD, coronary atherosclerosis, a-fib, CHF, PVD, parotid lesion, diverticulitis, partial epilepsy, anemia    Patient Stated Goals  get stronger; able to walk and transfer better    Currently in Pain?  No/denies         [facility] PT Assessment - 02/06/18 1035      Assessment   Medical Diagnosis  Hemiparesis affecting left side as late effect of cerebrovascular accident  ([name])    Referring Provider (PT)  Dr. Letta Pate    Onset Date/Surgical Date  08/09/17    Hand Dominance  Right      Prior Function   Level of Independence  Independent      Transfers   Transfers  Sit to Stand;Stand to Sit;Stand Pivot Transfers    Sit to Stand  4: Min assist;3: Mod assist    Sit to Stand Details (indicate cue type and reason)  cues for  full anterior lean and activation through LLE    Stand to Sit  4: Min assist    Stand Pivot Transfers  3: Mod assist    Stand Pivot Transfer Details (indicate cue type and reason)  with AFO; requires LUE support and assistance to weight shift to R and advance LLE      Ambulation/Gait   Ambulation/Gait  Yes    Ambulation/Gait Assistance  3: Mod assist    Ambulation/Gait Assistance Details  with RW with hand orthosis, AFO and Evenup on R foot for leg length discrepency.  Pt required therapist assistance on L side and cues to initiate weight shift to R, assistance to place LLE in neutral rotation and cues to activate LLE extension in stance.  Pt able to advance LLE each time    Ambulation Distance (Feet)  40 Feet    Assistive device  Rolling walker    Gait Pattern  Step-to pattern;Step-through pattern;Decreased step length - left;Decreased step length - right;Decreased stance time - left;Decreased stride  length;Decreased hip/knee flexion - left;Decreased weight shift to right;Left flexed knee in stance;Poor foot clearance - left    Ambulation Surface  Level;Indoor      Standardized Balance Assessment   Standardized Balance Assessment  Berg Balance Test      Berg Balance Test   Sit to Stand  Needs minimal aid to stand or to stabilize    Standing Unsupported  Unable to stand 30 seconds unassisted    Sitting with Back Unsupported but Feet Supported on Floor or Stool  Able to sit safely and securely 2 minutes    Stand to Sit  Controls descent by using hands    Transfers  Needs one person to assist    Standing Unsupported with Eyes Closed  Needs help to keep from falling    Standing Ubsupported with Feet Together  Needs help to attain position and unable to hold for 15 seconds    From Standing, Reach Forward with Outstretched Arm  Loses balance while trying/requires external support    From Standing Position, Pick up Object from Floor  Unable to try/needs assist to keep balance    From Standing Position, Turn to Look Behind Over each Shoulder  Needs assist to keep from losing balance and falling    Turn 360 Degrees  Needs assistance while turning    Standing Unsupported, Alternately Place Feet on Step/Stool  Needs assistance to keep from falling or unable to try    Standing Unsupported, One Foot in Front  Loses balance while stepping or standing    Standing on One Leg  Unable to try or needs assist to prevent fall    Total Score  9    Berg comment:  9/56                           PT Education - 02/06/18 1255    Education Details  Progress towards goals, areas to continue to work towards, pt specific goals, advised pt to bring power w/c to therapy for practice/training, discussed why progress has been slow: lack of safe way to practice/carry over at home, pusher's syndrome    Person(s) Educated  Patient;Child(ren)    Methods  Explanation    Comprehension  Verbalized  understanding       PT Short Term Goals - 01/23/18 0959      PT SHORT TERM GOAL #1   Title  Pt will be IND in HEP  to improve the deficits listed above. TARGET DATE FOR ALL STGS: 01/11/18    Status  Partially Met      PT SHORT TERM GOAL #2   Title  Pt will be able to standing without UE support and feet apart for 2 minutes, with S, to perform ADLs safely.     Status  Partially Met      PT SHORT TERM GOAL #3   Title  Perform TUG, gait speed, and BERG-write STGs/LTGs, once tolerated.     Status  Partially Met      PT SHORT TERM GOAL #4   Title  Pt will amb. 100' with LRAD, over even terrain, at MOD I level to improve functional mobility.     Status  Not Met        PT Long Term Goals - 02/06/18 1029      PT LONG TERM GOAL #1   Title  Pt will verbalize understanding of CVA risk factors and s/s of CVA to reduce risk of additional CVA. TARGET DATE FOR ALL LTGS: 02/08/18    Status  On-going      PT LONG TERM GOAL #2   Title  Pt will amb. 200' outdoors, with LRAD, with S, in order to walk in his yard safely.     Status  Not Met      PT LONG TERM GOAL #3   Title  Trial manual w/c outdoors and write goal as indicated.     Baseline  deferred - pt is receiving power w/c this afternoon for indoor/outdoor use    Status  Deferred      PT LONG TERM GOAL #4   Title  Pt will improve BERG score to >/=20/56 to decr. falls risk.     Status  Not Met         PT Short Term Goals - 02/06/18 1305      PT SHORT TERM GOAL #1   Title  Pt will perform standing and strengthening HEP with supervision of family    Time  4    Period  Weeks    Status  Revised    Target Date  03/14/18      PT SHORT TERM GOAL #2   Title  Pt will perform stand pivot transfers with AFO, shoe lift and RW with min A to L and R    Time  4    Period  Weeks    Status  Revised    Target Date  03/14/18      PT SHORT TERM GOAL #3   Title  Pt will improve BERG balance test by 8 points     Baseline  9/56    Time  4     Period  Weeks    Status  Revised    Target Date  03/14/18      PT SHORT TERM GOAL #4   Title  Pt will ambulate x 115' with RW, AFO and shoe lift with consistent Min A    Baseline  40' with RW and mod A, AFO    Time  4    Period  Weeks    Status  Revised    Target Date  03/14/18      PT SHORT TERM GOAL #5   Title  Pt will demonstrate safe power w/c mobility over indoor and outdoor surfaces >500' MOD I and negotiate ramped surfaces and around obstacles safely    Time  4  Period  Weeks    Status  New    Target Date  03/14/18      PT Long Term Goals - 02/06/18 1321      PT LONG TERM GOAL #1   Title  Pt will demonstrate ability to perform final HEP and walking program with supervision of family    Time  8    Period  Weeks    Status  New    Target Date  04/13/18      PT LONG TERM GOAL #2   Title  Pt will ambulate x 200' over indoor surfaces with LRAD, AFO and shoe lift with supervision    Time  8    Period  Weeks    Status  Revised    Target Date  04/13/18      PT LONG TERM GOAL #3   Title  Pt will perform stand pivot transfers with LRAD, AFO and shoe lift with supervision to L and R    Time  8    Period  Weeks    Status  New    Target Date  04/13/18      PT LONG TERM GOAL #4   Title  Pt will improve BERG score to >/=20/56 to decr. falls risk.     Time  8    Period  Weeks    Status  New    Target Date  04/13/17         Plan - 02/06/18 1257    Clinical Impression Statement  Treatment session focused on assessment of progress towards goals and discussion of continuation of therapy and patient's goals.  Pt is receiving power w/c today but his goal is to be able to walk in his home.  Pt is making slow progress and did not meet LTG; pt remains at high falls risk, requires min-mod A to stand and transfers safely due to pushing to L side and is unable to practice standing and ambulation at home unless his children are present, wife is unable to assist.  Pt does  demonstrate improvements in LLE activation and ability to ambulate each session.  Will benefit from continued PT services to address impairments, decrease burden of care, maximize functional mobility independence and decrease falls risk.    Rehab Potential  Good    Clinical Impairments Affecting Rehab Potential  left side weakness; poor left side motor control    PT Frequency  2x / week    PT Duration  8 weeks    PT Treatment/Interventions  ADLs/Self Care Home Management;Biofeedback;Canalith Repostioning;Electrical Stimulation;Therapeutic activities;Therapeutic exercise;Manual techniques;Vestibular;Wheelchair mobility training;Functional mobility training;Orthotic Fit/Training;Stair training;Gait training;Patient/family education;DME Instruction;Neuromuscular re-education;Balance training    PT Next Visit Plan  Supposed to bring in new power w/c - assess safety and transfers with power w/c.  NMR: focus on active weight shift to R, use visual target to stimulate L foot advancement (kick block, bean bag, etc).  cont to work on transfers (STS and stand pivot with RW) ;make sure he is getting weight shift correct;  scifit was good tool for recipriocal mvt and UE involvement; he wants to try to get all the way around the gym with (RW with L h/o)    Consulted and Agree with Plan of Care  Patient;Family member/caregiver    Family Member Consulted  daughter       Patient will benefit from skilled therapeutic intervention in order to improve the following deficits and impairments:  Abnormal gait, Decreased endurance, Decreased knowledge of  use of DME, Decreased strength, Impaired UE functional use, Decreased balance, Decreased mobility, Decreased range of motion, Decreased coordination, Impaired flexibility, Postural dysfunction  Visit Diagnosis: Muscle weakness (generalized)  Unsteadiness on feet  Spastic hemiplegia of left nondominant side as late effect of cerebral infarction [facility])  Other  abnormalities of gait and mobility     Problem List Patient Active Problem List   Diagnosis Date Noted  . 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 Acute blood loss anemia   . Chronic diastolic (congestive) heart failure ([place])   . Hemiparesis affecting left side as late effect of cerebrovascular accident ([place])   . Diabetes mellitus type 2 in obese ([place])   . Small vessel disease ([place]) 08/11/2017  . Left hemiparesis ([place])   . Benign essential hypertension with delivery   . CVA (cerebral vascular accident) ([place]) 08/10/2017  . 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 of localized onset, not intractable, without status epilepticus ([place]) 06/20/2017  . Primary osteoarthritis of both knees 05/11/2017  . Stroke (cerebrum) ([place]) 03/21/2017  . Type 2 diabetes mellitus with complication, with long-term current use of insulin ([place]) 03/27/2015  . Atherosclerosis of native coronary artery of native heart without angina pectoris 03/27/2015  . Palpitations 11/04/2014  . S/P CABG (coronary artery bypass graft) 11/04/2014  . Atrial fibrillation, unspecified   . History of stroke 09/04/2014  . Obesity (BMI 30-39.9) 11/29/2013  . History of gout 05/16/2013  . Congestive heart failure ([place]) 04/20/2009  . DYSPNEA 02/11/2009  . CARDIOVASCULAR FUNCTION STUDY, ABNORMAL 01/29/2009  . NECK PAIN 08/03/2007  . ANEMIA / OTHER 05/16/2007  . Coronary atherosclerosis 05/16/2007  . PAROXYSMAL ATRIAL FIBRILLATION 05/16/2007  . Subcortical infarction ([place]) 05/16/2007  . Hyperlipidemia 03/30/2007  . DEGENERATIVE JOINT DISEASE 03/30/2007  . PAROTID LESION, UNSPECIFIED 09/29/2006  . Partial epilepsy with impairment of consciousness ([place]) 09/29/2006  . Diabetic polyneuropathy ([place]) 09/29/2006  . Essential hypertension 09/29/2006  . PERIPHERAL VASCULAR DISEASE 09/29/2006  . DIVERTICULOSIS 09/29/2006   Rico Junker, PT, DPT 02/06/18     1:04 PM    [facility] [address] [place], [place], [address] Phone: [phone]   Fax:  [phone]  Name: Fred [name] MRN: [id number] Date of Birth: 1935/05/23

## 2018-02-12 ENCOUNTER — Encounter: Payer: Medicare Other | Admitting: Occupational Therapy

## 2018-02-12 ENCOUNTER — Ambulatory Visit: Payer: Medicare Other | Admitting: Rehabilitation

## 2018-02-13 ENCOUNTER — Encounter: Payer: Medicare Other | Admitting: Occupational Therapy

## 2018-02-13 ENCOUNTER — Encounter: Payer: Self-pay | Admitting: Rehabilitation

## 2018-02-13 ENCOUNTER — Encounter: Payer: Self-pay | Admitting: Occupational Therapy

## 2018-02-13 ENCOUNTER — Ambulatory Visit: Payer: Medicare Other | Admitting: Occupational Therapy

## 2018-02-13 ENCOUNTER — Ambulatory Visit: Payer: Medicare Other | Admitting: Rehabilitation

## 2018-02-13 ENCOUNTER — Ambulatory Visit: Payer: Medicare Other | Attending: Physical Medicine & Rehabilitation | Admitting: Rehabilitation

## 2018-02-13 DIAGNOSIS — R293 Abnormal posture: Secondary | ICD-10-CM | POA: Insufficient documentation

## 2018-02-13 DIAGNOSIS — G8929 Other chronic pain: Secondary | ICD-10-CM | POA: Diagnosis not present

## 2018-02-13 DIAGNOSIS — I69354 Hemiplegia and hemiparesis following cerebral infarction affecting left non-dominant side: Secondary | ICD-10-CM

## 2018-02-13 DIAGNOSIS — M6281 Muscle weakness (generalized): Secondary | ICD-10-CM | POA: Insufficient documentation

## 2018-02-13 DIAGNOSIS — M25512 Pain in left shoulder: Secondary | ICD-10-CM | POA: Diagnosis not present

## 2018-02-13 DIAGNOSIS — R2689 Other abnormalities of gait and mobility: Secondary | ICD-10-CM | POA: Insufficient documentation

## 2018-02-13 DIAGNOSIS — M25612 Stiffness of left shoulder, not elsewhere classified: Secondary | ICD-10-CM | POA: Diagnosis not present

## 2018-02-13 DIAGNOSIS — R2681 Unsteadiness on feet: Secondary | ICD-10-CM

## 2018-02-13 DIAGNOSIS — M25532 Pain in left wrist: Secondary | ICD-10-CM

## 2018-02-13 DIAGNOSIS — M25632 Stiffness of left wrist, not elsewhere classified: Secondary | ICD-10-CM

## 2018-02-13 NOTE — Therapy (Signed)
[facility] [address] [place], [place], [address] Phone: [phone]   Fax:  [phone]  Physical Therapy Treatment  Patient Details  Name: Fred [name] MRN: [id number] Date of Birth: 01-17-1936 Referring Provider (PT): Dr. Letta Pate   Encounter Date: 02/13/2018  PT End of Session - 02/13/18 1258    Visit Number  15    Number of Visits  30    Date for PT Re-Evaluation  04/13/18    Authorization Type  Medicare, UHC, Tricare (no PTAs). Progress note every 10th visit.     PT Start Time  1153   PT was late from previous session   PT Stop Time  1232    PT Time Calculation (min)  39 min    Equipment Utilized During Treatment  --   mod A -S prn   Activity Tolerance  Patient tolerated treatment well    Behavior During Therapy  WFL for tasks assessed/performed       Past Medical History:  Diagnosis Date  . Atrial fibrillation ([place])    Post operative  . CAD (coronary artery disease)    s/p cabg  . CHF (congestive heart failure) ([place])   . Diverticulosis   . DJD (degenerative joint disease)   . DM (diabetes mellitus) ([place])   . History of anemia   . History of stroke   . Hyperlipidemia   . Hypertension   . 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 Peripheral vascular disease ([place])   . Polyneuropathy, diabetic ([place])   . Seizure disorder, complex partial ([place])   . Syncope and collapse     Past Surgical History:  Procedure Laterality Date  . CATARACT EXTRACTION W/ INTRAOCULAR LENS  IMPLANT, BILATERAL  06/2013   Dr. [name]  . CORONARY ARTERY BYPASS GRAFT  02/13/2004   4 vesel  . removal of sebaceous cyst from neck  08/2007   Dr. [name]  . TONSILLECTOMY      There were no vitals filed for this visit.  Subjective Assessment - 02/13/18 1257    Subjective  Pt reports no changes, knee feeling okay today.     Pertinent History  DM with polyneuropathy, hx of cataracts, hx of CVA in  03/2017 and again 07/2017, OA in R knee, DJD, HTN, HLD, coronary atherosclerosis, a-fib, CHF, PVD, parotid lesion, diverticulitis, partial epilepsy, anemia    Patient Stated Goals  get stronger; able to walk and transfer better    Currently in Pain?  No/denies            NMR:  Performed several tasks in tall kneeling with and without support on Kaye bench for improved LLE activation and weight bearing, improved postural control and to decrease pusher tendencies.  While in tall kneeling, had pt work on active hip extension while maintaining upright trunk (decreasing L trunk rotation) without UE support (had pt lift RUE during task).  Progressed to performing mini squats in tall kneeling x 10 reps with support initially on bench, however moved to having pt place RUE on PTs shoulder for decreased "pulling" and to allow improved LE activation.  Pt did very well with cues.  Again, attempt to keep tactile facilitation to a minimum in order to allow pt to self correct.  Then had pt work on improved L lateral weight shift/L proximal hip stability moving RLE to the side and back x 10 reps (5 with UE support and 5 without support).  While in resting  position on B elbows had pt work on lateral weight shifting x 10 reps with cues to decrease head motion.  Ended with pt getting out of tall kneeling going to R hip.  Pt needs Max A (+2) to do but feel that this is great transitional movement for him to do.    Gait:  Ended session with gait with RW, L hand orthosis, L AFO, and R shoe lift.  Performed 2 bouts x 13' and then x 20' at min A level.  Pt needs continued cues for improved R lateral weight shift, but still needs assist to fully clear LLE during gait.  Provided light tactile cues at R hip for R weight shift and tapping to L thigh to improve L LE activation when in stance phase of gait.                     PT Education - 02/13/18 1258    Education Details  purpose of tall kneeling    Person(s)  Educated  Patient;Child(ren)    Methods  Explanation    Comprehension  Verbalized understanding       PT Short Term Goals - 02/06/18 1305      PT SHORT TERM GOAL #1   Title  Pt will perform standing and strengthening HEP with supervision of family    Time  4    Period  Weeks    Status  Revised    Target Date  03/14/18      PT SHORT TERM GOAL #2   Title  Pt will perform stand pivot transfers with AFO, shoe lift and RW with min A to L and R    Time  4    Period  Weeks    Status  Revised    Target Date  03/14/18      PT SHORT TERM GOAL #3   Title  Pt will improve BERG balance test by 8 points     Baseline  9/56    Time  4    Period  Weeks    Status  Revised    Target Date  03/14/18      PT SHORT TERM GOAL #4   Title  Pt will ambulate x 115' with RW, AFO and shoe lift with consistent Min A    Baseline  40' with RW and mod A, AFO    Time  4    Period  Weeks    Status  Revised    Target Date  03/14/18      PT SHORT TERM GOAL #5   Title  Pt will demonstrate safe power w/c mobility over indoor and outdoor surfaces >500' MOD I and negotiate ramped surfaces and around obstacles safely    Time  4    Period  Weeks    Status  New    Target Date  03/14/18        PT Long Term Goals - 02/06/18 1321      PT LONG TERM GOAL #1   Title  Pt will demonstrate ability to perform final HEP and walking program with supervision of family    Time  8    Period  Weeks    Status  New    Target Date  04/13/18      PT LONG TERM GOAL #2   Title  Pt will ambulate x 200' over indoor surfaces with LRAD, AFO and shoe lift with supervision    Time  8  Period  Weeks    Status  Revised    Target Date  04/13/18      PT LONG TERM GOAL #3   Title  Pt will perform stand pivot transfers with LRAD, AFO and shoe lift with supervision to L and R    Time  8    Period  Weeks    Status  New    Target Date  04/13/18      PT LONG TERM GOAL #4   Title  Pt will improve BERG score to >/=20/56 to  decr. falls risk.     Time  8    Period  Weeks    Status  New    Target Date  04/13/17            Plan - 02/13/18 1259    Clinical Impression Statement  Skilled session continues to focus on NMR for improved postural control, decreasing pusher tendencies and improving LLE activation.  Pt tolerated tall kneeling tasks very well today without an increase in knee pain.      Rehab Potential  Good    Clinical Impairments Affecting Rehab Potential  left side weakness; poor left side motor control    PT Frequency  2x / week    PT Duration  8 weeks    PT Treatment/Interventions  ADLs/Self Care Home Management;Biofeedback;Canalith Repostioning;Electrical Stimulation;Therapeutic activities;Therapeutic exercise;Manual techniques;Vestibular;Wheelchair mobility training;Functional mobility training;Orthotic Fit/Training;Stair training;Gait training;Patient/family education;DME Instruction;Neuromuscular re-education;Balance training    PT Next Visit Plan  Supposed to bring in new power w/c - assess safety and transfers with power w/c.  Letta Moynahan I forgot to ask him about the w/c) NMR: focus on active weight shift to R, use visual target to stimulate L foot advancement (kick block, bean bag, etc).  cont to work on transfers (STS and stand pivot with RW) ;make sure he is getting weight shift correct;  scifit was good tool for recipriocal mvt and UE involvement; he wants to try to get all the way around the gym with (RW with L h/o)    Consulted and Agree with Plan of Care  Patient;Family member/caregiver    Family Member Consulted  daughter       Patient will benefit from skilled therapeutic intervention in order to improve the following deficits and impairments:  Abnormal gait, Decreased endurance, Decreased knowledge of use of DME, Decreased strength, Impaired UE functional use, Decreased balance, Decreased mobility, Decreased range of motion, Decreased coordination, Impaired flexibility, Postural  dysfunction  Visit Diagnosis: Unsteadiness on feet  Muscle weakness (generalized)  Spastic hemiplegia of left nondominant side as late effect of cerebral infarction ([facility])  Other abnormalities of gait and mobility     Problem List Patient Active Problem List   Diagnosis Date Noted  . 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 Acute blood loss anemia   . Chronic diastolic (congestive) heart failure ([place])   . Hemiparesis affecting left side as late effect of cerebrovascular accident ([place])   . Diabetes mellitus type 2 in obese ([place])   . Small vessel disease ([place]) 08/11/2017  . Left hemiparesis ([place])   . Benign essential hypertension with delivery   . CVA (cerebral vascular accident) ([place]) 08/10/2017  . 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 of localized onset, not intractable, without status epilepticus ([place]) 06/20/2017  . Primary osteoarthritis of both knees 05/11/2017  . Stroke (cerebrum) ([place]) 03/21/2017  . Type 2 diabetes mellitus with complication, with long-term current use of insulin (  [place]) 03/27/2015  . Atherosclerosis of native coronary artery of native heart without angina pectoris 03/27/2015  . Palpitations 11/04/2014  . S/P CABG (coronary artery bypass graft) 11/04/2014  . Atrial fibrillation, unspecified   . History of stroke 09/04/2014  . Obesity (BMI 30-39.9) 11/29/2013  . History of gout 05/16/2013  . Congestive heart failure ([place]) 04/20/2009  . DYSPNEA 02/11/2009  . CARDIOVASCULAR FUNCTION STUDY, ABNORMAL 01/29/2009  . NECK PAIN 08/03/2007  . ANEMIA / OTHER 05/16/2007  . Coronary atherosclerosis 05/16/2007  . PAROXYSMAL ATRIAL FIBRILLATION 05/16/2007  . Subcortical infarction ([place]) 05/16/2007  . Hyperlipidemia 03/30/2007  . DEGENERATIVE JOINT DISEASE 03/30/2007  . PAROTID LESION, UNSPECIFIED 09/29/2006  . Partial epilepsy with impairment of consciousness ([place]) 09/29/2006  .  Diabetic polyneuropathy ([place]) 09/29/2006  . Essential hypertension 09/29/2006  . PERIPHERAL VASCULAR DISEASE 09/29/2006  . DIVERTICULOSIS 09/29/2006    Cameron Sprang, PT, MPT [facility] [address] [place], [place], [address] Phone: [phone]   Fax:  [phone] 02/13/18, 2:36 PM  Name: Fred [name] MRN: [id number] Date of Birth: 09-Feb-1936

## 2018-02-13 NOTE — Therapy (Signed)
[facility] [address] [place], [place], [address] Phone: [phone]   Fax:  [phone]  Occupational Therapy Treatment  Patient Details  Name: Fred [name] MRN: [id number] Date of Birth: [birth year] Referring Provider (OT): Dr. Alysia Penna   Encounter Date: 02/13/2018  OT End of Session - 02/13/18 1855    Visit Number  16    Number of Visits  24    Date for OT Re-Evaluation  03/08/18    Authorization Type  Medicare, Tricare, UHC - pt will need PN every 10th visit    Authorization Time Period  90 days    Authorization - Visit Number  58    Authorization - Number of Visits  20    OT Start Time  1325    OT Stop Time  1415    OT Time Calculation (min)  50 min    Activity Tolerance  Patient tolerated treatment well       Past Medical History:  Diagnosis Date  . Atrial fibrillation ([place])    Post operative  . CAD (coronary artery disease)    s/p cabg  . CHF (congestive heart failure) ([place])   . Diverticulosis   . DJD (degenerative joint disease)   . DM (diabetes mellitus) ([place])   . History of anemia   . History of stroke   . Hyperlipidemia   . Hypertension   . 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 Peripheral vascular disease ([place])   . Polyneuropathy, diabetic ([place])   . Seizure disorder, complex partial ([place])   . Syncope and collapse     Past Surgical History:  Procedure Laterality Date  . CATARACT EXTRACTION W/ INTRAOCULAR LENS  IMPLANT, BILATERAL  06/2013   Dr. [name]  . CORONARY ARTERY BYPASS GRAFT  02/13/2004   4 vesel  . removal of sebaceous cyst from neck  08/2007   Dr. [name]  . TONSILLECTOMY      There were no vitals filed for this visit.  Subjective Assessment - 02/13/18 1852    Subjective   I feel like I am going to fall when I move to the right.     Patient is accompained by:  Family member   son in law   Pertinent History  [name] 08/09/2017. DIscharged  home on 09/22/2017 after inpt rehab stay. Pt had HH PT and OT. PMH:      Patient Stated Goals  I want to get walking again.  I am an outdoor guy    Currently in Pain?  No/denies       Patient seen for aquatic therapy today.  Treatment took place in water 2.5-4 feet deep depending upon activity.  Pt entered the pool via ramp and water wheelchair.  Treatment focused on postural alignment and control in static standing first with RUE support and then with no UE support. Pt needs min -mod a for LLE control as well as cues and min facilitation for alignment (pt postures with trunk rotated L and R shoulder hike with L hip retracted due to perceptual and sensory impairments). Facilitated pt using active trunk control vs RUE for realignment of trunk.  Also addressed upper trunk rotation on lower trunk (upper trunk initiated) in standing without UE support and min a.  Progressed to dynamic weight shifting (lateral as well as A/P weight shifts) - pt is now aware that he has L and posterior bias however does not initiate any automatic postural reactions  even with "planned fall."  Pt can describe bias and with assistance can attempt to realign COG over BOS.  Due to buoyancy and hydrostatic pressure, pt is given more time to initiate reactions with "fall"  Also addressed functional ambulation with RUE support on pool wall for forward and backward ambulation. Pt needs min -mod assist.  Addressed isolated LLE exercises for knee flexion and extension with knee at 90* on therapist's knee with pt in standing to facilitate more weight shift to RLE.  Pt exited pool via ramp and water wheelchair.                       OT Short Term Goals - 02/13/18 1853      OT SHORT TERM GOAL #1   Title  Pt and family will be mod I with basic home exercise/home activities program for LUE, balance and functional mobility - 01/25/2018    Status  Achieved      OT SHORT TERM GOAL #2   Title  Pt will be supervision with UB  bathing    Status  Achieved   pt is able to do however wife continues to assist     OT SHORT TERM GOAL #3   Title  Pt will be min a for LB bathing    Status  Achieved      OT SHORT TERM GOAL #4   Title  Pt will be close supervision for toilet transfers    Status  Achieved      OT SHORT TERM GOAL #5   Title  Pt will be min a for tub bench transfers (assist with LLE)    Status  Achieved      OT SHORT TERM GOAL #6   Title  Pt will tolerate shoulder flexion to 110* with no pain in supine in prep for HEP and low functional reach    Status  Achieved      OT SHORT TERM GOAL #7   Title  Pt will demonstrate shoulder flexion to at least 30* in prep for low reach    Status  On-going      OT SHORT TERM GOAL #8   Title  Pt will demonstrate ability to hold light objects in L hand while manipulating with R hand (i.e opening containers) with min a    Status  On-going      OT SHORT TERM GOAL  #9   TITLE  Pt will be mod a for LB dressing    Status  Achieved      OT SHORT TERM GOAL  #10   TITLE  Pt/family will be mod I with splint wear and care, prn    Status  Achieved        OT Long Term Goals - 02/13/18 1853      OT LONG TERM GOAL #1   Title  Pt and family will be mod I with upgraded HEP/home activities program - 03/08/2018    Status  On-going      OT LONG TERM GOAL #2   Title  Pt will be min a for LB dressing    Status  On-going      OT LONG TERM GOAL #3   Title  Pt will be mod I with toilet transfers    Status  On-going      OT LONG TERM GOAL #4   Title  Pt will be mod I with clothing manipulation with toileting activities  Status  On-going      OT LONG TERM GOAL #5   Title  Pt will be min a for simple familiar hot meal prep    Status  On-going      OT LONG TERM GOAL #6   Title  Pt will demonstate ability to use LUE as stabilzer at least 25% of the time during basic self care and fishing activities.    Status  On-going      OT LONG TERM GOAL #7   Title  Pt will be  mod I with casting simple fishing pole    Status  On-going      OT LONG TERM GOAL #8   Title  Pt will demonstate adequate postural alignment and control to complete simple activities in standing without UE support.     Status  On-going            Plan - 02/13/18 1854    Clinical Impression Statement  Pt with slow progress toward goals.  Pt with inconsistent ability for balance and functional mobility.     Occupational Profile and client history currently impacting functional performance  PMH: husband, father, grand father, retiree.  Pt loves the outdoors and fishing.  PMH: CAD, S/P CABG, DM, HTN, HLD, AFIB, CHF, seizure, PVD, obesity, ETHO abuse, TIA in 03/2017.      Occupational performance deficits (Please refer to evaluation for details):  ADL's;IADL's;Leisure;Social Participation;Rest and Sleep    Rehab Potential  Good    Current Impairments/barriers affecting progress:  medical history, severity of motor deficits, limited family suppor for carryover as pt's wife has cognitive decline as well    OT Frequency  2x / week    OT Duration  12 weeks    OT Treatment/Interventions  Self-care/ADL training;Aquatic Therapy;Electrical Stimulation;Moist Heat;Fluidtherapy;Ultrasound;Therapeutic exercise;Neuromuscular education;Energy conservation;DME and/or AE instruction;Passive range of motion;Manual Therapy;Functional Mobility Training;Splinting;Therapeutic activities;Patient/family education;Balance training    Plan  manual therapy to address LUE shouler girdle alignment, NMR for LUE, balance, functional mobility, ADL's, aquatic therapy     Consulted and Agree with Plan of Care  Patient;Family member/caregiver    Family Member Consulted  son in law       Patient will benefit from skilled therapeutic intervention in order to improve the following deficits and impairments:  Abnormal gait, Decreased balance, Decreased range of motion, Decreased mobility, Decreased knowledge of use of DME,  Decreased strength, Difficulty walking, Impaired UE functional use, Impaired tone, Impaired flexibility, Pain  Visit Diagnosis: Unsteadiness on feet  Muscle weakness (generalized)  Spastic hemiplegia of left nondominant side as late effect of cerebral infarction (HCC)  Hemiplegia and hemiparesis following cerebral infarction affecting left non-dominant side (HCC)  Abnormal posture  Chronic left shoulder pain  Pain in left wrist  Stiffness of left shoulder, not elsewhere classified  Stiffness of left wrist, not elsewhere classified    Problem List Patient Active Problem List   Diagnosis Date Noted  . 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 Acute blood loss anemia   . Chronic diastolic (congestive) heart failure ([place])   . Hemiparesis affecting left side as late effect of cerebrovascular accident ([place])   . Diabetes mellitus type 2 in obese ([place])   . Small vessel disease ([place]) 08/11/2017  . Left hemiparesis ([place])   . Benign essential hypertension with delivery   . CVA (cerebral vascular accident) ([place]) 08/10/2017  . 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 of localized onset, not intractable, without status epilepticus (  [place]) 06/20/2017  . Primary osteoarthritis of both knees 05/11/2017  . Stroke (cerebrum) ([place]) 03/21/2017  . Type 2 diabetes mellitus with complication, with long-term current use of insulin ([place]) 03/27/2015  . Atherosclerosis of native coronary artery of native heart without angina pectoris 03/27/2015  . Palpitations 11/04/2014  . S/P CABG (coronary artery bypass graft) 11/04/2014  . Atrial fibrillation, unspecified   . History of stroke 09/04/2014  . Obesity (BMI 30-39.9) 11/29/2013  . History of gout 05/16/2013  . Congestive heart failure ([place]) 04/20/2009  . DYSPNEA 02/11/2009  . CARDIOVASCULAR FUNCTION STUDY, ABNORMAL 01/29/2009  . NECK PAIN 08/03/2007  . ANEMIA / OTHER  05/16/2007  . Coronary atherosclerosis 05/16/2007  . PAROXYSMAL ATRIAL FIBRILLATION 05/16/2007  . Subcortical infarction ([place]) 05/16/2007  . Hyperlipidemia 03/30/2007  . DEGENERATIVE JOINT DISEASE 03/30/2007  . PAROTID LESION, UNSPECIFIED 09/29/2006  . Partial epilepsy with impairment of consciousness ([place]) 09/29/2006  . Diabetic polyneuropathy ([place]) 09/29/2006  . Essential hypertension 09/29/2006  . PERIPHERAL VASCULAR DISEASE 09/29/2006  . DIVERTICULOSIS 09/29/2006    Quay Burow, OTR/L 02/13/2018, 6:57 PM  [facility] [address] [place], [place], [address] Phone: [phone]   Fax:  [phone]  Name: PARISH DUBOSE MRN: [id number] Date of Birth: Aug 25, 1935

## 2018-02-19 ENCOUNTER — Ambulatory Visit (INDEPENDENT_AMBULATORY_CARE_PROVIDER_SITE_OTHER): Payer: Medicare Other | Admitting: Sports Medicine

## 2018-02-19 ENCOUNTER — Encounter: Payer: Self-pay | Admitting: Sports Medicine

## 2018-02-19 DIAGNOSIS — M170 Bilateral primary osteoarthritis of knee: Secondary | ICD-10-CM | POA: Diagnosis not present

## 2018-02-19 DIAGNOSIS — I639 Cerebral infarction, unspecified: Secondary | ICD-10-CM | POA: Diagnosis not present

## 2018-02-19 NOTE — Assessment & Plan Note (Signed)
Did well after Orthovisc series into both knees approximately 8 months ago. He did have a recurrence of swelling and pain at the last visit, we aspirated fluid and injected, all symptoms are resolved now, crystal analysis was negative. He had a stroke and stroke PT and aquatic therapy have already commenced at the [facility] location. Return to see me on an as-needed basis.

## 2018-02-19 NOTE — Progress Notes (Signed)
Subjective:    CC: Follow-up  HPI: Charlotte Crumb returns, we injected his right knee at the last visit, he returns pain-free.  I reviewed the past medical history, family history, social history, surgical history, and allergies today and no changes were needed.  Please see the problem list section below in epic for further details.  Past Medical History: Past Medical History:  Diagnosis Date  . Atrial fibrillation ([place])    Post operative  . CAD (coronary artery disease)    s/p cabg  . CHF (congestive heart failure) ([place])   . Diverticulosis   . DJD (degenerative joint disease)   . DM (diabetes mellitus) ([place])   . History of anemia   . History of stroke   . Hyperlipidemia   . Hypertension   . 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 Peripheral vascular disease ([place])   . Polyneuropathy, diabetic ([place])   . Seizure disorder, complex partial ([place])   . Syncope and collapse    Past Surgical History: Past Surgical History:  Procedure Laterality Date  . CATARACT EXTRACTION W/ INTRAOCULAR LENS  IMPLANT, BILATERAL  06/2013   Dr. [name]  . CORONARY ARTERY BYPASS GRAFT  02/13/2004   4 vesel  . removal of sebaceous cyst from neck  08/2007   Dr. [name]  . TONSILLECTOMY     Social History: Social History   Socioeconomic History  . Marital status: Married    Spouse name: Kengo Sturges  . Number of children: 3  . Years of education: Not on file  . Highest education level: Not on file  Occupational History  . 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 Financial resource strain: Not on file  . Food insecurity:    Worry: Not on file    Inability: Not on file  . Transportation needs:    Medical: Not on file    Non-medical: Not on file  Tobacco Use  . Smoking status: Former Smoker    Packs/day: 1.00    Years: 22.00    Pack years: 22.00    Types: Cigarettes    Last attempt to quit: 03/14/1977    Years  since quitting: 40.9  . Smokeless tobacco: Never Used  Substance and Sexual Activity  . Alcohol use: Yes    Comment: Pt has not had alcohol since 03/2017 CVA  . Drug use: No  . Sexual activity: Never  Lifestyle  . Physical activity:    Days per week: Not on file    Minutes per session: Not on file  . Stress: Not on file  Relationships  . Social connections:    Talks on phone: Not on file    Gets together: Not on file    Attends religious service: Not on file    Active member of club or organization: Not on file    Attends meetings of clubs or organizations: Not on file    Relationship status: Not on file  Other Topics Concern  . Not on file  Social History Narrative   3 caffeinated drinks per day. Mostly coffee. He walks 15 minutes 3 days per week. Quit smoking in 1978.      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    Family History: Family History  Problem Relation Age of Onset  . Epilepsy  Father   . Stroke Mother   . Lung cancer Unknown    Allergies: Allergies  Allergen Reactions  . Lisinopril Cough   Medications: See med rec.  Review of Systems: No fevers, chills, night sweats, weight loss, chest pain, or shortness of breath.   Objective:    General: Well Developed, well nourished, and in no acute distress.  Neuro: Alert and oriented x3, extra-ocular muscles intact, sensation grossly intact.  HEENT: Normocephalic, atraumatic, pupils equal round reactive to light, neck supple, no masses, no lymphadenopathy, thyroid nonpalpable.  Skin: Warm and dry, no rashes. Cardiac: Regular rate and rhythm, no murmurs rubs or gallops, no lower extremity edema.  Respiratory: Clear to auscultation bilaterally. Not using accessory muscles, speaking in full sentences. Right knee: Normal to inspection with no erythema or effusion or obvious bony abnormalities. Palpation normal with no warmth or  joint line tenderness or patellar tenderness or condyle tenderness. ROM normal in flexion and extension and lower leg rotation. Ligaments with solid consistent endpoints including ACL, PCL, LCL, MCL. Negative Mcmurray's and provocative meniscal tests. Non painful patellar compression. Patellar and quadriceps tendons unremarkable. Hamstring and quadriceps strength is normal.  Impression and Recommendations:    Primary osteoarthritis of both knees Did well after Orthovisc series into both knees approximately 8 months ago. He did have a recurrence of swelling and pain at the last visit, we aspirated fluid and injected, all symptoms are resolved now, crystal analysis was negative. He had a stroke and stroke PT and aquatic therapy have already commenced at the treat treat physical therapy location. Return to see me on an as-needed basis. ___________________________________________ [name]. Dianah Field, M.D., ABFM., CAQSM. Primary Care and Sports Medicine [facility]  Adjunct Professor of [employer]

## 2018-02-20 ENCOUNTER — Ambulatory Visit: Payer: Medicare Other | Admitting: Occupational Therapy

## 2018-02-20 ENCOUNTER — Ambulatory Visit: Payer: Medicare Other | Admitting: Rehabilitation

## 2018-02-20 ENCOUNTER — Encounter: Payer: Self-pay | Admitting: Rehabilitation

## 2018-02-20 ENCOUNTER — Encounter: Payer: Self-pay | Admitting: Occupational Therapy

## 2018-02-20 DIAGNOSIS — M25512 Pain in left shoulder: Secondary | ICD-10-CM

## 2018-02-20 DIAGNOSIS — R2681 Unsteadiness on feet: Secondary | ICD-10-CM

## 2018-02-20 DIAGNOSIS — I69354 Hemiplegia and hemiparesis following cerebral infarction affecting left non-dominant side: Secondary | ICD-10-CM

## 2018-02-20 DIAGNOSIS — M25632 Stiffness of left wrist, not elsewhere classified: Secondary | ICD-10-CM

## 2018-02-20 DIAGNOSIS — R293 Abnormal posture: Secondary | ICD-10-CM

## 2018-02-20 DIAGNOSIS — M25612 Stiffness of left shoulder, not elsewhere classified: Secondary | ICD-10-CM

## 2018-02-20 DIAGNOSIS — R2689 Other abnormalities of gait and mobility: Secondary | ICD-10-CM

## 2018-02-20 DIAGNOSIS — G8929 Other chronic pain: Secondary | ICD-10-CM

## 2018-02-20 DIAGNOSIS — M6281 Muscle weakness (generalized): Secondary | ICD-10-CM | POA: Diagnosis not present

## 2018-02-20 DIAGNOSIS — M25532 Pain in left wrist: Secondary | ICD-10-CM

## 2018-02-20 NOTE — Therapy (Signed)
[facility] [address] [place], [place], [address] Phone: [phone]   Fax:  [phone]  Physical Therapy Treatment  Patient Details  Name: Fred [name] MRN: [id number] Date of Birth: August 24, 1935 Referring Provider (PT): Dr. Letta Pate   Encounter Date: 02/20/2018  PT End of Session - 02/20/18 1521    Visit Number  16    Number of Visits  30    Date for PT Re-Evaluation  04/13/18    Authorization Type  Medicare, UHC, Tricare (no PTAs). Progress note every 10th visit.     PT Start Time  1147    PT Stop Time  1230    PT Time Calculation (min)  43 min    Equipment Utilized During Treatment  --   mod A -S prn   Activity Tolerance  Patient tolerated treatment well    Behavior During Therapy  WFL for tasks assessed/performed       Past Medical History:  Diagnosis Date  . Atrial fibrillation ([place])    Post operative  . CAD (coronary artery disease)    s/p cabg  . CHF (congestive heart failure) ([place])   . Diverticulosis   . DJD (degenerative joint disease)   . DM (diabetes mellitus) ([place])   . History of anemia   . History of stroke   . Hyperlipidemia   . Hypertension   . 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 Peripheral vascular disease ([place])   . Polyneuropathy, diabetic ([place])   . Seizure disorder, complex partial ([place])   . Syncope and collapse     Past Surgical History:  Procedure Laterality Date  . CATARACT EXTRACTION W/ INTRAOCULAR LENS  IMPLANT, BILATERAL  06/2013   Dr. [name]  . CORONARY ARTERY BYPASS GRAFT  02/13/2004   4 vesel  . removal of sebaceous cyst from neck  08/2007   Dr. [name]  . TONSILLECTOMY      There were no vitals filed for this visit.  Subjective Assessment - 02/20/18 1518    Subjective  Pt reports R knee has been feeling well.  They also report that they have been walking at home with hemi walker and with son. Note that he does not have shoe with AFO  nor does he have lift for R shoe.     Pertinent History  DM with polyneuropathy, hx of cataracts, hx of CVA in 03/2017 and again 07/2017, OA in R knee, DJD, HTN, HLD, coronary atherosclerosis, a-fib, CHF, PVD, parotid lesion, diverticulitis, partial epilepsy, anemia    Patient Stated Goals  get stronger; able to walk and transfer better    Currently in Pain?  No/denies               NMR:  In // bars with RUE support, addressed sit<>stand transfers with emphasis on forward weight shift and trunk lean.  While standing worked on lateral weight shifts, esp to the R to decrease pusher tendencies and also to the L for improved LLE activation.  Progressed to performing R lateral weight shift to advance LLE with forward weight shift onto LLE with assist needed to advance LLE fully due to not wearing AFO during session.  Pt demo's improvement within session with ease of forward advancement of LLE.  Also had pt work on advancing/retro stepping RLE for improved sustained activation of LLE.  Progressed to having pt place LLE on 2" step to power up through LLE x 10 reps then x 10 reps  to 4" step.  Also had pt try to step RLE up to step and elevate to maintain R lateral weight shift, however this would cause L foot to rotate and not allow for safe return of RLE, therefore just had pt step RLE to step and down to improve LLE activation.  Pt requires intermittent tapping and blocking at L knee to prevent buckle but demo's improvement throughout session.  Transitioned to mat for last part of session to continue to work on sit<>stand, which he was able to do with min/guard to S assist during session and reaching R UE to wall, sustaining R lateral weight shift x 20 secs x 2 reps, at S level.                  PT Education - 02/20/18 1521    Education Details  bringing AFO/shoe and shoe lift to all sessions, also bringing water shoes and lift to aquatic appts     Person(s) Educated   Patient;Spouse;Child(ren)    Methods  Explanation    Comprehension  Verbalized understanding       PT Short Term Goals - 02/06/18 1305      PT SHORT TERM GOAL #1   Title  Pt will perform standing and strengthening HEP with supervision of family    Time  4    Period  Weeks    Status  Revised    Target Date  03/14/18      PT SHORT TERM GOAL #2   Title  Pt will perform stand pivot transfers with AFO, shoe lift and RW with min A to L and R    Time  4    Period  Weeks    Status  Revised    Target Date  03/14/18      PT SHORT TERM GOAL #3   Title  Pt will improve BERG balance test by 8 points     Baseline  9/56    Time  4    Period  Weeks    Status  Revised    Target Date  03/14/18      PT SHORT TERM GOAL #4   Title  Pt will ambulate x 115' with RW, AFO and shoe lift with consistent Min A    Baseline  40' with RW and mod A, AFO    Time  4    Period  Weeks    Status  Revised    Target Date  03/14/18      PT SHORT TERM GOAL #5   Title  Pt will demonstrate safe power w/c mobility over indoor and outdoor surfaces >500' MOD I and negotiate ramped surfaces and around obstacles safely    Time  4    Period  Weeks    Status  New    Target Date  03/14/18        PT Long Term Goals - 02/06/18 1321      PT LONG TERM GOAL #1   Title  Pt will demonstrate ability to perform final HEP and walking program with supervision of family    Time  8    Period  Weeks    Status  New    Target Date  04/13/18      PT LONG TERM GOAL #2   Title  Pt will ambulate x 200' over indoor surfaces with LRAD, AFO and shoe lift with supervision    Time  8    Period  Weeks  Status  Revised    Target Date  04/13/18      PT LONG TERM GOAL #3   Title  Pt will perform stand pivot transfers with LRAD, AFO and shoe lift with supervision to L and R    Time  8    Period  Weeks    Status  New    Target Date  04/13/18      PT LONG TERM GOAL #4   Title  Pt will improve BERG score to >/=20/56 to decr.  falls risk.     Time  8    Period  Weeks    Status  New    Target Date  04/13/17            Plan - 02/20/18 1522    Clinical Impression Statement  Skilled session focused on improving lateral weight shifts both to the R to decrease pusher tendencies and also to the L for improved LLE activation.  Also worked on using LLE to power up onto 2" then 4" step.  Pt does demonstrate marked improvement with ability to shift R today and increase LLE activation.  Also note that he did bring in power w/c today.  It is custom rehab seating and pt doing well negotiating in environments.     Rehab Potential  Good    Clinical Impairments Affecting Rehab Potential  left side weakness; poor left side motor control    PT Frequency  2x / week    PT Duration  8 weeks    PT Treatment/Interventions  ADLs/Self Care Home Management;Biofeedback;Canalith Repostioning;Electrical Stimulation;Therapeutic activities;Therapeutic exercise;Manual techniques;Vestibular;Wheelchair mobility training;Functional mobility training;Orthotic Fit/Training;Stair training;Gait training;Patient/family education;DME Instruction;Neuromuscular re-education;Balance training    PT Next Visit Plan  Continue to educate on need to wear AFO/shoe lift at all times.  Note that they are walking at home with hemi walker still-should be just see what that looks like or train son on RW/HO?  NMR: focus on active weight shift to R, use visual target to stimulate L foot advancement (kick block, bean bag, etc).  cont to work on transfers (STS and stand pivot with RW) ;make sure he is getting weight shift correct;  scifit was good tool for recipriocal mvt and UE involvement; he wants to try to get all the way around the gym with (RW with L h/o)    Consulted and Agree with Plan of Care  Patient;Family member/caregiver    Family Member Consulted  daughter       Patient will benefit from skilled therapeutic intervention in order to improve the following  deficits and impairments:  Abnormal gait, Decreased endurance, Decreased knowledge of use of DME, Decreased strength, Impaired UE functional use, Decreased balance, Decreased mobility, Decreased range of motion, Decreased coordination, Impaired flexibility, Postural dysfunction  Visit Diagnosis: Unsteadiness on feet  Muscle weakness (generalized)  Hemiplegia and hemiparesis following cerebral infarction affecting left non-dominant side (HCC)  Abnormal posture  Other abnormalities of gait and mobility     Problem List Patient Active Problem List   Diagnosis Date Noted  . 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 Acute blood loss anemia   . Chronic diastolic (congestive) heart failure ([place])   . Hemiparesis affecting left side as late effect of cerebrovascular accident ([place])   . Diabetes mellitus type 2 in obese ([place])   . Small vessel disease ([place]) 08/11/2017  . Left hemiparesis ([place])   . Benign essential hypertension with delivery   . CVA (cerebral  vascular accident) ([place]) 08/10/2017  . 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 of localized onset, not intractable, without status epilepticus ([place]) 06/20/2017  . Primary osteoarthritis of both knees 05/11/2017  . Stroke (cerebrum) ([place]) 03/21/2017  . Type 2 diabetes mellitus with complication, with long-term current use of insulin ([place]) 03/27/2015  . Atherosclerosis of native coronary artery of native heart without angina pectoris 03/27/2015  . Palpitations 11/04/2014  . S/P CABG (coronary artery bypass graft) 11/04/2014  . Atrial fibrillation, unspecified   . History of stroke 09/04/2014  . Obesity (BMI 30-39.9) 11/29/2013  . History of gout 05/16/2013  . Congestive heart failure ([place]) 04/20/2009  . DYSPNEA 02/11/2009  . CARDIOVASCULAR FUNCTION STUDY, ABNORMAL 01/29/2009  . NECK PAIN 08/03/2007  . ANEMIA / OTHER 05/16/2007  . Coronary atherosclerosis  05/16/2007  . PAROXYSMAL ATRIAL FIBRILLATION 05/16/2007  . Subcortical infarction ([place]) 05/16/2007  . Hyperlipidemia 03/30/2007  . DEGENERATIVE JOINT DISEASE 03/30/2007  . PAROTID LESION, UNSPECIFIED 09/29/2006  . Partial epilepsy with impairment of consciousness ([place]) 09/29/2006  . Diabetic polyneuropathy ([place]) 09/29/2006  . Essential hypertension 09/29/2006  . PERIPHERAL VASCULAR DISEASE 09/29/2006  . DIVERTICULOSIS 09/29/2006    Cameron Sprang, PT, MPT [facility] [address] [place], [place], [address] Phone: [phone]   Fax:  [phone] 02/20/18, 3:36 PM  Name: Fred [name] MRN: [id number] Date of Birth: 1935/12/03

## 2018-02-20 NOTE — Therapy (Signed)
[facility] [address] [place], [place], [address] Phone: [phone]   Fax:  [phone]  Occupational Therapy Treatment  Patient Details  Name: Fred [name] MRN: [id number] Date of Birth: 1935/08/08 Referring Provider (OT): Dr. Alysia Penna   Encounter Date: 02/20/2018  OT End of Session - 02/20/18 1708    Visit Number  17    Number of Visits  24    Date for OT Re-Evaluation  03/08/18    Authorization Type  Medicare, Tricare, UHC - pt will need PN every 10th visit    Authorization Time Period  90 days    Authorization - Visit Number  65    Authorization - Number of Visits  20    OT Start Time  1325    OT Stop Time  1415    OT Time Calculation (min)  50 min    Activity Tolerance  Patient tolerated treatment well       Past Medical History:  Diagnosis Date  . Atrial fibrillation ([place])    Post operative  . CAD (coronary artery disease)    s/p cabg  . CHF (congestive heart failure) ([place])   . Diverticulosis   . DJD (degenerative joint disease)   . DM (diabetes mellitus) ([place])   . History of anemia   . History of stroke   . Hyperlipidemia   . Hypertension   . 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 Peripheral vascular disease ([place])   . Polyneuropathy, diabetic ([place])   . Seizure disorder, complex partial ([place])   . Syncope and collapse     Past Surgical History:  Procedure Laterality Date  . CATARACT EXTRACTION W/ INTRAOCULAR LENS  IMPLANT, BILATERAL  06/2013   Dr. [name]  . CORONARY ARTERY BYPASS GRAFT  02/13/2004   4 vesel  . removal of sebaceous cyst from neck  08/2007   Dr. [name]  . TONSILLECTOMY      There were no vitals filed for this visit.  Subjective Assessment - 02/20/18 1704    Subjective   I can move better in the water now    Patient is accompained by:  Family member   wife and son in law   Pertinent History  [name] 08/09/2017. DIscharged home on  09/22/2017 after inpt rehab stay. Pt had HH PT and OT. PMH:      Patient Stated Goals  I want to get walking again.  I am an outdoor guy    Currently in Pain?  No/denies       Patient seen for aquatic therapy today.  Treatment took place in water 2.5-4 feet deep depending upon activity.  Pt entered the pool via ramp. Pt entered part way in water wheelchair and then as able to ambulate remainder of ramp with build up on RLE to accomodate leg length discrepancy and use of hand rail.  Pt required min a and mod cues for LLE placement, finding midline, postural alignment and control.  Treatment focused on static standing balance with RUE support on pool wall with emphasis on alignment and activation of RLE and trunk. Pt continues to rotate L hip posteriorly and rotate upper trunk to Left while pushing with RUE due to perceptual difficulties in finding midline ( as well as sensory deficits).  Progressed to active upper  trunk rotation to the right while engaging LLE with RUE on floating dumb bell - pt needed min facilitation for LLE activation and for  full upper trunk rotation. Postural alignment improved with activation. Addressed weight shifts from midline to R with one UE support - initially required significant cueing and facilitation however with repetition pt able to complete with mod cueing and lighter facilitation allowing pt to progress to activity with back against pool wall and RUE on dumb bell.  Addressed functional ambulation with RUE support on pool wall and mod facilitation for LLE placement and to full weight shift onto LLE before stepping with RLE - addressed forward and backward ambulation.  Also addressed LLE active knee flexion and extension using small noodle under L foot - pt required to control noodle up and down while standing on RLE with light UE support (initially mod assist however able with repetition to progress to min a).  Pt exited the pool via side stepping up ramp part way using  railing and min facilitation and completing ramp in water wheelchair.                       OT Short Term Goals - 02/20/18 1705      OT SHORT TERM GOAL #1   Title  Pt and family will be mod I with basic home exercise/home activities program for LUE, balance and functional mobility - 01/25/2018    Status  Achieved      OT SHORT TERM GOAL #2   Title  Pt will be supervision with UB bathing    Status  Achieved   pt is able to do however wife continues to assist     OT SHORT TERM GOAL #3   Title  Pt will be min a for LB bathing    Status  Achieved      OT SHORT TERM GOAL #4   Title  Pt will be close supervision for toilet transfers    Status  Achieved      OT SHORT TERM GOAL #5   Title  Pt will be min a for tub bench transfers (assist with LLE)    Status  Achieved      OT SHORT TERM GOAL #6   Title  Pt will tolerate shoulder flexion to 110* with no pain in supine in prep for HEP and low functional reach    Status  Achieved      OT SHORT TERM GOAL #7   Title  Pt will demonstrate shoulder flexion to at least 30* in prep for low reach    Status  On-going      OT SHORT TERM GOAL #8   Title  Pt will demonstrate ability to hold light objects in L hand while manipulating with R hand (i.e opening containers) with min a    Status  On-going      OT SHORT TERM GOAL  #9   TITLE  Pt will be mod a for LB dressing    Status  Achieved      OT SHORT TERM GOAL  #10   TITLE  Pt/family will be mod I with splint wear and care, prn    Status  Achieved        OT Long Term Goals - 02/20/18 1706      OT LONG TERM GOAL #1   Title  Pt and family will be mod I with upgraded HEP/home activities program - 03/08/2018    Status  On-going      OT LONG TERM GOAL #2   Title  Pt will be min a for LB dressing  Status  On-going      OT LONG TERM GOAL #3   Title  Pt will be mod I with toilet transfers    Status  On-going      OT LONG TERM GOAL #4   Title  Pt will be mod I  with clothing manipulation with toileting activities    Status  On-going      OT LONG TERM GOAL #5   Title  Pt will be min a for simple familiar hot meal prep    Status  On-going      OT LONG TERM GOAL #6   Title  Pt will demonstate ability to use LUE as stabilzer at least 25% of the time during basic self care and fishing activities.    Status  On-going      OT LONG TERM GOAL #7   Title  Pt will be mod I with casting simple fishing pole    Status  On-going      OT LONG TERM GOAL #8   Title  Pt will demonstate adequate postural alignment and control to complete simple activities in standing without UE support.     Status  On-going            Plan - 02/20/18 1706    Clinical Impression Statement  Pt continues with slow progress toward goals. Pt with improved mobility and balance with use of built up shoe on R to accomodate leg length discrepancy    Occupational Profile and client history currently impacting functional performance  PMH: husband, father, grand father, retiree.  Pt loves the outdoors and fishing.  PMH: CAD, S/P CABG, DM, HTN, HLD, AFIB, CHF, seizure, PVD, obesity, ETHO abuse, TIA in 03/2017.      Occupational performance deficits (Please refer to evaluation for details):  ADL's;IADL's;Leisure;Social Participation;Rest and Sleep    Rehab Potential  Good    Current Impairments/barriers affecting progress:  medical history, severity of motor deficits, limited family suppor for carryover as pt's wife has cognitive decline as well    OT Frequency  2x / week    OT Duration  12 weeks    OT Treatment/Interventions  Self-care/ADL training;Aquatic Therapy;Electrical Stimulation;Moist Heat;Fluidtherapy;Ultrasound;Therapeutic exercise;Neuromuscular education;Energy conservation;DME and/or AE instruction;Passive range of motion;Manual Therapy;Functional Mobility Training;Splinting;Therapeutic activities;Patient/family education;Balance training    Plan  manual therapy to address LUE  shouler girdle alignment, NMR for LUE, balance, functional mobility, ADL's, aquatic therapy     Consulted and Agree with Plan of Care  Patient;Family member/caregiver    Family Member Consulted  son in law and wife       Patient will benefit from skilled therapeutic intervention in order to improve the following deficits and impairments:  Abnormal gait, Decreased balance, Decreased range of motion, Decreased mobility, Decreased knowledge of use of DME, Decreased strength, Difficulty walking, Impaired UE functional use, Impaired tone, Impaired flexibility, Pain  Visit Diagnosis: Unsteadiness on feet  Muscle weakness (generalized)  Abnormal posture  Spastic hemiplegia of left nondominant side as late effect of cerebral infarction (HCC)  Chronic left shoulder pain  Pain in left wrist  Stiffness of left shoulder, not elsewhere classified  Stiffness of left wrist, not elsewhere classified    Problem List Patient Active Problem List   Diagnosis Date Noted  . 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 Acute blood loss anemia   . Chronic diastolic (congestive) heart failure ([place])   . Hemiparesis affecting left side as late effect of cerebrovascular accident ([place])   .  Diabetes mellitus type 2 in obese ([place])   . Small vessel disease ([place]) 08/11/2017  . Left hemiparesis ([place])   . Benign essential hypertension with delivery   . CVA (cerebral vascular accident) ([place]) 08/10/2017  . 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 of localized onset, not intractable, without status epilepticus ([place]) 06/20/2017  . Primary osteoarthritis of both knees 05/11/2017  . Stroke (cerebrum) ([place]) 03/21/2017  . Type 2 diabetes mellitus with complication, with long-term current use of insulin ([place]) 03/27/2015  . Atherosclerosis of native coronary artery of native heart without angina pectoris 03/27/2015  . Palpitations 11/04/2014  .  S/P CABG (coronary artery bypass graft) 11/04/2014  . Atrial fibrillation, unspecified   . History of stroke 09/04/2014  . Obesity (BMI 30-39.9) 11/29/2013  . History of gout 05/16/2013  . Congestive heart failure ([place]) 04/20/2009  . DYSPNEA 02/11/2009  . CARDIOVASCULAR FUNCTION STUDY, ABNORMAL 01/29/2009  . NECK PAIN 08/03/2007  . ANEMIA / OTHER 05/16/2007  . Coronary atherosclerosis 05/16/2007  . PAROXYSMAL ATRIAL FIBRILLATION 05/16/2007  . Subcortical infarction ([place]) 05/16/2007  . Hyperlipidemia 03/30/2007  . DEGENERATIVE JOINT DISEASE 03/30/2007  . PAROTID LESION, UNSPECIFIED 09/29/2006  . Partial epilepsy with impairment of consciousness ([place]) 09/29/2006  . Diabetic polyneuropathy ([place]) 09/29/2006  . Essential hypertension 09/29/2006  . PERIPHERAL VASCULAR DISEASE 09/29/2006  . DIVERTICULOSIS 09/29/2006    Quay Burow, OTR/L 02/20/2018, 5:09 PM  [facility] [address] [place], [place], [address] Phone: [phone]   Fax:  [phone]  Name: Fred [name] MRN: [id number] Date of Birth: 02-19-1936

## 2018-02-22 ENCOUNTER — Encounter: Payer: Self-pay | Admitting: Physical Therapy

## 2018-02-22 ENCOUNTER — Ambulatory Visit: Payer: Medicare Other | Admitting: Physical Therapy

## 2018-02-22 ENCOUNTER — Encounter: Payer: Self-pay | Admitting: Occupational Therapy

## 2018-02-22 ENCOUNTER — Ambulatory Visit: Payer: Medicare Other | Admitting: Occupational Therapy

## 2018-02-22 DIAGNOSIS — I69354 Hemiplegia and hemiparesis following cerebral infarction affecting left non-dominant side: Secondary | ICD-10-CM

## 2018-02-22 DIAGNOSIS — R2681 Unsteadiness on feet: Secondary | ICD-10-CM

## 2018-02-22 DIAGNOSIS — M25512 Pain in left shoulder: Secondary | ICD-10-CM

## 2018-02-22 DIAGNOSIS — R2689 Other abnormalities of gait and mobility: Secondary | ICD-10-CM

## 2018-02-22 DIAGNOSIS — R293 Abnormal posture: Secondary | ICD-10-CM | POA: Diagnosis not present

## 2018-02-22 DIAGNOSIS — M6281 Muscle weakness (generalized): Secondary | ICD-10-CM | POA: Diagnosis not present

## 2018-02-22 DIAGNOSIS — M25632 Stiffness of left wrist, not elsewhere classified: Secondary | ICD-10-CM

## 2018-02-22 DIAGNOSIS — M25612 Stiffness of left shoulder, not elsewhere classified: Secondary | ICD-10-CM

## 2018-02-22 DIAGNOSIS — G8929 Other chronic pain: Secondary | ICD-10-CM

## 2018-02-22 DIAGNOSIS — M25532 Pain in left wrist: Secondary | ICD-10-CM

## 2018-02-22 NOTE — Therapy (Signed)
[facility] [address] [place], [place], [address] Phone: [phone]   Fax:  [phone]  Occupational Therapy Treatment  Patient Details  Name: Fred [name] MRN: [id number] Date of Birth: Oct 15, 1935 Referring Provider (OT): Dr. Alysia Penna   Encounter Date: 02/22/2018  OT End of Session - 02/22/18 1127    Visit Number  18    Number of Visits  24    Date for OT Re-Evaluation  03/08/18    Authorization Type  Medicare, Tricare, UHC - pt will need PN every 10th visit    Authorization Time Period  90 days    Authorization - Visit Number  40    Authorization - Number of Visits  20    OT Start Time  0932    OT Stop Time  1015    OT Time Calculation (min)  43 min    Activity Tolerance  Patient tolerated treatment well       Past Medical History:  Diagnosis Date  . Atrial fibrillation ([place])    Post operative  . CAD (coronary artery disease)    s/p cabg  . CHF (congestive heart failure) ([place])   . Diverticulosis   . DJD (degenerative joint disease)   . DM (diabetes mellitus) ([place])   . History of anemia   . History of stroke   . Hyperlipidemia   . Hypertension   . 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 Peripheral vascular disease ([place])   . Polyneuropathy, diabetic ([place])   . Seizure disorder, complex partial ([place])   . Syncope and collapse     Past Surgical History:  Procedure Laterality Date  . CATARACT EXTRACTION W/ INTRAOCULAR LENS  IMPLANT, BILATERAL  06/2013   Dr. [name]  . CORONARY ARTERY BYPASS GRAFT  02/13/2004   4 vesel  . removal of sebaceous cyst from neck  08/2007   Dr. [name]  . TONSILLECTOMY      There were no vitals filed for this visit.  Subjective Assessment - 02/22/18 0936    Subjective   I got my shoe back and its built up.      Patient is accompained by:  Family member    son in law   Pertinent History  [name] 08/09/2017. DIscharged home on 09/22/2017  after inpt rehab stay. Pt had HH PT and OT. PMH:      Patient Stated Goals  I want to get walking again.  I am an outdoor guy    Currently in Pain?  No/denies                   OT Treatments/Exercises (OP) - 02/22/18 0001      ADLs   LB Dressing  Addressed LB dressing for donning and doffing pants - pt is able to doff R shoe.  Pt with improving sit to stand and standing balance during dressing.  Pt reports he is still not donning pants - "I get help with that."  Again discussed with pt and son in law importance for patient to start to move his body during functional tasks.  Pt able to stand and maintain balance with clos superivsion while helper scoots pants up and down. Pt able to take pants off and put pants on over feet with cues and close supevision for forward leaning.  Reinforced that someone needs to be providing close supervision when pt is leaning forward. Pt and son in law both verbalized understanding.  Also discussed that pt should dress in standard wheelchair in order to allow for feet to touch the floor and have supportive surface.       Neurological Re-education Exercises   Other Exercises 1  Neuro re education to address functional ambulation with R hand orthosis and RW.  Pt needs step by step cueing and min a for placement of L foot as well as ligth min a. Pt is easily distracted and requires cues to attend to task. Pt able to follow cues for improved postural alignment and control however has great difficulty independently making adjustments.               OT Education - 02/22/18 1125    Education Details  LB dressing, sit to stand, stand to sit, importance of pt participation    Person(s) Educated  Patient;Child(ren)    Methods  Explanation;Demonstration    Comprehension  Verbalized understanding;Returned demonstration       OT Short Term Goals - 02/22/18 1126      OT SHORT TERM GOAL #1   Title  Pt and family will be mod I with basic home exercise/home  activities program for LUE, balance and functional mobility - 01/25/2018    Status  Achieved      OT SHORT TERM GOAL #2   Title  Pt will be supervision with UB bathing    Status  Achieved   pt is able to do however wife continues to assist     OT SHORT TERM GOAL #3   Title  Pt will be min a for LB bathing    Status  Achieved      OT SHORT TERM GOAL #4   Title  Pt will be close supervision for toilet transfers    Status  Achieved      OT SHORT TERM GOAL #5   Title  Pt will be min a for tub bench transfers (assist with LLE)    Status  Achieved      OT SHORT TERM GOAL #6   Title  Pt will tolerate shoulder flexion to 110* with no pain in supine in prep for HEP and low functional reach    Status  Achieved      OT SHORT TERM GOAL #7   Title  Pt will demonstrate shoulder flexion to at least 30* in prep for low reach    Status  On-going      OT SHORT TERM GOAL #8   Title  Pt will demonstrate ability to hold light objects in L hand while manipulating with R hand (i.e opening containers) with min a    Status  On-going      OT SHORT TERM GOAL  #9   TITLE  Pt will be mod a for LB dressing    Status  Achieved      OT SHORT TERM GOAL  #10   TITLE  Pt/family will be mod I with splint wear and care, prn    Status  Achieved        OT Long Term Goals - 02/22/18 1126      OT LONG TERM GOAL #1   Title  Pt and family will be mod I with upgraded HEP/home activities program - 03/08/2018    Status  On-going      OT LONG TERM GOAL #2   Title  Pt will be min a for LB dressing    Status  On-going      OT LONG  TERM GOAL #3   Title  Pt will be mod I with toilet transfers    Status  On-going      OT LONG TERM GOAL #4   Title  Pt will be mod I with clothing manipulation with toileting activities    Status  On-going      OT LONG TERM GOAL #5   Title  Pt will be min a for simple familiar hot meal prep    Status  On-going      OT LONG TERM GOAL #6   Title  Pt will demonstate ability  to use LUE as stabilzer at least 25% of the time during basic self care and fishing activities.    Status  On-going      OT LONG TERM GOAL #7   Title  Pt will be mod I with casting simple fishing pole    Status  On-going      OT LONG TERM GOAL #8   Title  Pt will demonstate adequate postural alignment and control to complete simple activities in standing without UE support.     Status  On-going            Plan - 02/22/18 1126    Clinical Impression Statement  Pt with slow progress toward goals. Pt is improving in ability to be more independent with basic self care however has poor carry over at home.     Occupational Profile and client history currently impacting functional performance  PMH: husband, father, grand father, retiree.  Pt loves the outdoors and fishing.  PMH: CAD, S/P CABG, DM, HTN, HLD, AFIB, CHF, seizure, PVD, obesity, ETHO abuse, TIA in 03/2017.      Occupational performance deficits (Please refer to evaluation for details):  ADL's;IADL's;Leisure;Social Participation;Rest and Sleep    Rehab Potential  Good    Current Impairments/barriers affecting progress:  medical history, severity of motor deficits, limited family suppor for carryover as pt's wife has cognitive decline as well    OT Frequency  2x / week    OT Duration  12 weeks    OT Treatment/Interventions  Self-care/ADL training;Aquatic Therapy;Electrical Stimulation;Moist Heat;Fluidtherapy;Ultrasound;Therapeutic exercise;Neuromuscular education;Energy conservation;DME and/or AE instruction;Passive range of motion;Manual Therapy;Functional Mobility Training;Splinting;Therapeutic activities;Patient/family education;Balance training    Plan  check LB dressing, manual therapy to address LUE shouler girdle alignment, NMR for LUE, balance, functional mobility, ADL's, aquatic therapy     Consulted and Agree with Plan of Care  Patient;Family member/caregiver    Family Member Consulted  son in law        Patient will  benefit from skilled therapeutic intervention in order to improve the following deficits and impairments:  Abnormal gait, Decreased balance, Decreased range of motion, Decreased mobility, Decreased knowledge of use of DME, Decreased strength, Difficulty walking, Impaired UE functional use, Impaired tone, Impaired flexibility, Pain  Visit Diagnosis: Unsteadiness on feet  Muscle weakness (generalized)  Abnormal posture  Spastic hemiplegia of left nondominant side as late effect of cerebral infarction (HCC)  Chronic left shoulder pain  Pain in left wrist  Stiffness of left shoulder, not elsewhere classified  Stiffness of left wrist, not elsewhere classified    Problem List Patient Active Problem List   Diagnosis Date Noted  . 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 Acute blood loss anemia   . Chronic diastolic (congestive) heart failure ([place])   . Hemiparesis affecting left side as late effect of cerebrovascular accident ([place])   . Diabetes mellitus type 2  in obese ([place])   . Small vessel disease ([place]) 08/11/2017  . Left hemiparesis ([place])   . Benign essential hypertension with delivery   . CVA (cerebral vascular accident) ([place]) 08/10/2017  . 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 of localized onset, not intractable, without status epilepticus ([place]) 06/20/2017  . Primary osteoarthritis of both knees 05/11/2017  . Stroke (cerebrum) ([place]) 03/21/2017  . Type 2 diabetes mellitus with complication, with long-term current use of insulin ([place]) 03/27/2015  . Atherosclerosis of native coronary artery of native heart without angina pectoris 03/27/2015  . Palpitations 11/04/2014  . S/P CABG (coronary artery bypass graft) 11/04/2014  . Atrial fibrillation, unspecified   . History of stroke 09/04/2014  . Obesity (BMI 30-39.9) 11/29/2013  . History of gout 05/16/2013  . Congestive heart failure ([place]) 04/20/2009  .  DYSPNEA 02/11/2009  . CARDIOVASCULAR FUNCTION STUDY, ABNORMAL 01/29/2009  . NECK PAIN 08/03/2007  . ANEMIA / OTHER 05/16/2007  . Coronary atherosclerosis 05/16/2007  . PAROXYSMAL ATRIAL FIBRILLATION 05/16/2007  . Subcortical infarction ([place]) 05/16/2007  . Hyperlipidemia 03/30/2007  . DEGENERATIVE JOINT DISEASE 03/30/2007  . PAROTID LESION, UNSPECIFIED 09/29/2006  . Partial epilepsy with impairment of consciousness ([place]) 09/29/2006  . Diabetic polyneuropathy ([place]) 09/29/2006  . Essential hypertension 09/29/2006  . PERIPHERAL VASCULAR DISEASE 09/29/2006  . DIVERTICULOSIS 09/29/2006    Quay Burow, OTR/L 02/22/2018, 11:28 AM  [facility] [address] [place], [place], [address] Phone: [phone]   Fax:  [phone]  Name: Fred [name] MRN: [id number] Date of Birth: 1935/09/08

## 2018-02-22 NOTE — Therapy (Signed)
[facility] [address] [place], [place], [address] Phone: [phone]   Fax:  [phone]  Physical Therapy Treatment  Patient Details  Name: Fred [name] MRN: [id number] Date of Birth: 1935-11-20 Referring Provider (PT): Dr. Letta Pate   Encounter Date: 02/22/2018  PT End of Session - 02/22/18 1159    Visit Number  17    Number of Visits  30    Date for PT Re-Evaluation  04/13/18    Authorization Type  Medicare, UHC, Tricare (no PTAs). Progress note every 10th visit.     PT Start Time  1019    PT Stop Time  1105    PT Time Calculation (min)  46 min    Equipment Utilized During Treatment  --   mod A -S prn   Activity Tolerance  Patient tolerated treatment well    Behavior During Therapy  WFL for tasks assessed/performed       Past Medical History:  Diagnosis Date  . Atrial fibrillation ([place])    Post operative  . CAD (coronary artery disease)    s/p cabg  . CHF (congestive heart failure) ([place])   . Diverticulosis   . DJD (degenerative joint disease)   . DM (diabetes mellitus) ([place])   . History of anemia   . History of stroke   . Hyperlipidemia   . Hypertension   . 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 Peripheral vascular disease ([place])   . Polyneuropathy, diabetic ([place])   . Seizure disorder, complex partial ([place])   . Syncope and collapse     Past Surgical History:  Procedure Laterality Date  . CATARACT EXTRACTION W/ INTRAOCULAR LENS  IMPLANT, BILATERAL  06/2013   Dr. [name]  . CORONARY ARTERY BYPASS GRAFT  02/13/2004   4 vesel  . removal of sebaceous cyst from neck  08/2007   Dr. [name]  . TONSILLECTOMY      There were no vitals filed for this visit.  Subjective Assessment - 02/22/18 1023    Subjective  R knee is 100% better.  Loves his power w/c but is still walking at home.  Has lift on R shoe now, wearing L AFO.  Is looking into the [place] assisting with payment for  conversion van. Pt would like to have a RW at home to walk with.    Pertinent History  DM with polyneuropathy, hx of cataracts, hx of CVA in 03/2017 and again 07/2017, OA in R knee, DJD, HTN, HLD, coronary atherosclerosis, a-fib, CHF, PVD, parotid lesion, diverticulitis, partial epilepsy, anemia    Patient Stated Goals  get stronger; able to walk and transfer better    Currently in Pain?  No/denies                       [facility] Adult PT Treatment/Exercise - 02/22/18 1028      Transfers   Transfers  Sit to Stand;Stand to Sit    Sit to Stand  4: Min assist    Sit to Stand Details (indicate cue type and reason)  cues for full anterior weight shift and for utilization of LLE to stand    Stand to Sit  4: Min assist      Ambulation/Gait   Ambulation/Gait  Yes    Ambulation/Gait Assistance  3: Mod assist    Ambulation/Gait Assistance Details  performed assessment of gait with hemi walker x 75' x 2 and then with RW with  hand orthosis x 2 to determine which will be safest and most efficient way to ambulate at home with son in law.  Therapist performed gait with pt with hemi walker and one lap with RW; second lap with RW pt and son in law performed together.  With hemi walker pt performs more step to sequence and required min-mod A to maintain weight shift over RLE and assistance to keep LLE elevated while he initiates hip flexion with knee extension - demonstrated slower gait speed but demonstrated decreased pushing and improved control of forward momentum.  With RW pt demonstrated increased lateropulsion to L, required increased assistance to advance LLE and assistance to guide RW and control forward momentum.  Son in law also felt it required more effort to assist and did not feel as safe.  Determined that pt will continue to ambulate at home with hemi walker for now and will continue gait training with RW with therapy only    Ambulation Distance (Feet)  75 Feet   x 4   Assistive device   Rolling walker;Hemi-walker    Gait Pattern  Step-to pattern;Step-through pattern;Decreased step length - left;Decreased step length - right;Decreased stride length;Decreased hip/knee flexion - left;Decreased dorsiflexion - left;Decreased weight shift to right;Lateral trunk lean to left;Poor foot clearance - left    Ambulation Surface  Level;Indoor             PT Education - 02/22/18 1156    Education Details  keep using hemi walker at home with family, RW with therapy for now    Person(s) Educated  Patient;Child(ren)    Methods  Explanation;Demonstration    Comprehension  Verbalized understanding;Returned demonstration       PT Short Term Goals - 02/06/18 1305      PT SHORT TERM GOAL #1   Title  Pt will perform standing and strengthening HEP with supervision of family    Time  4    Period  Weeks    Status  Revised    Target Date  03/14/18      PT SHORT TERM GOAL #2   Title  Pt will perform stand pivot transfers with AFO, shoe lift and RW with min A to L and R    Time  4    Period  Weeks    Status  Revised    Target Date  03/14/18      PT SHORT TERM GOAL #3   Title  Pt will improve BERG balance test by 8 points     Baseline  9/56    Time  4    Period  Weeks    Status  Revised    Target Date  03/14/18      PT SHORT TERM GOAL #4   Title  Pt will ambulate x 115' with RW, AFO and shoe lift with consistent Min A    Baseline  40' with RW and mod A, AFO    Time  4    Period  Weeks    Status  Revised    Target Date  03/14/18      PT SHORT TERM GOAL #5   Title  Pt will demonstrate safe power w/c mobility over indoor and outdoor surfaces >500' MOD I and negotiate ramped surfaces and around obstacles safely    Time  4    Period  Weeks    Status  New    Target Date  03/14/18        PT Long Term  Goals - 02/06/18 1321      PT LONG TERM GOAL #1   Title  Pt will demonstrate ability to perform final HEP and walking program with supervision of family    Time  8     Period  Weeks    Status  New    Target Date  04/13/18      PT LONG TERM GOAL #2   Title  Pt will ambulate x 200' over indoor surfaces with LRAD, AFO and shoe lift with supervision    Time  8    Period  Weeks    Status  Revised    Target Date  04/13/18      PT LONG TERM GOAL #3   Title  Pt will perform stand pivot transfers with LRAD, AFO and shoe lift with supervision to L and R    Time  8    Period  Weeks    Status  New    Target Date  04/13/18      PT LONG TERM GOAL #4   Title  Pt will improve BERG score to >/=20/56 to decr. falls risk.     Time  8    Period  Weeks    Status  New    Target Date  04/13/17            Plan - 02/22/18 1200    Clinical Impression Statement  Treatment session focused on assessment of gait with hemi walker and then RW to determine best option for home with family.  Pt to continue to utilize hemi walker at home for now and will continue gait with RW with therapy only.  Will continue to progress towards LTG.    Rehab Potential  Good    Clinical Impairments Affecting Rehab Potential  left side weakness; poor left side motor control    PT Frequency  2x / week    PT Duration  8 weeks    PT Treatment/Interventions  ADLs/Self Care Home Management;Biofeedback;[place] Repostioning;Electrical Stimulation;Therapeutic activities;Therapeutic exercise;Manual techniques;Vestibular;Wheelchair mobility training;Functional mobility training;Orthotic Fit/Training;Stair training;Gait training;Patient/family education;DME Instruction;Neuromuscular re-education;Balance training    PT Next Visit Plan  Continue to educate on need to wear AFO/shoe lift at all times.  NMR: focus on active weight shift to R and activation of LLE during swing phase and sit <> stand.  cont to work on transfers (STS and stand pivot with RW) ;make sure he is getting weight shift correct;  scifit was good tool for recipriocal mvt and UE involvement; he wants to try to get all the way around the  gym with (RW with L h/o)    Consulted and Agree with Plan of Care  Patient;Family member/caregiver    Family Member Consulted  daughter       Patient will benefit from skilled therapeutic intervention in order to improve the following deficits and impairments:  Abnormal gait, Decreased endurance, Decreased knowledge of use of DME, Decreased strength, Impaired UE functional use, Decreased balance, Decreased mobility, Decreased range of motion, Decreased coordination, Impaired flexibility, Postural dysfunction  Visit Diagnosis: Unsteadiness on feet  Other abnormalities of gait and mobility  Hemiplegia and hemiparesis following cerebral infarction affecting left non-dominant side [facility])     Problem List Patient Active Problem List   Diagnosis Date Noted  . 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 Acute blood loss anemia   . Chronic diastolic (congestive) heart failure ([place])   . Hemiparesis affecting left side as late effect of  cerebrovascular accident [facility])   . Diabetes mellitus type 2 in obese ([place])   . Small vessel disease ([place]) 08/11/2017  . Left hemiparesis ([place])   . Benign essential hypertension with delivery   . CVA (cerebral vascular accident) ([place]) 08/10/2017  . 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 of localized onset, not intractable, without status epilepticus ([place]) 06/20/2017  . Primary osteoarthritis of both knees 05/11/2017  . Stroke (cerebrum) ([employer]) 03/21/2017  . Type 2 diabetes mellitus with complication, with long-term current use of insulin ([place]) 03/27/2015  . Atherosclerosis of native coronary artery of native heart without angina pectoris 03/27/2015  . Palpitations 11/04/2014  . S/P CABG (coronary artery bypass graft) 11/04/2014  . Atrial fibrillation, unspecified   . History of stroke 09/04/2014  . Obesity (BMI 30-39.9) 11/29/2013  . History of gout 05/16/2013  . Congestive heart  failure ([place]) 04/20/2009  . DYSPNEA 02/11/2009  . CARDIOVASCULAR FUNCTION STUDY, ABNORMAL 01/29/2009  . NECK PAIN 08/03/2007  . ANEMIA / OTHER 05/16/2007  . Coronary atherosclerosis 05/16/2007  . PAROXYSMAL ATRIAL FIBRILLATION 05/16/2007  . Subcortical infarction ([place]) 05/16/2007  . Hyperlipidemia 03/30/2007  . DEGENERATIVE JOINT DISEASE 03/30/2007  . PAROTID LESION, UNSPECIFIED 09/29/2006  . Partial epilepsy with impairment of consciousness ([place]) 09/29/2006  . Diabetic polyneuropathy ([place]) 09/29/2006  . Essential hypertension 09/29/2006  . PERIPHERAL VASCULAR DISEASE 09/29/2006  . DIVERTICULOSIS 09/29/2006    Rico Junker, PT, DPT 02/22/18    12:08 PM    [facility] [address] [place], [place], [address] Phone: [phone]   Fax:  [phone]  Name: Fred [name] MRN: [id number] Date of Birth: 1935/07/15

## 2018-02-23 ENCOUNTER — Encounter: Payer: Medicare Other | Attending: Physical Medicine & Rehabilitation

## 2018-02-23 ENCOUNTER — Other Ambulatory Visit: Payer: Self-pay

## 2018-02-23 ENCOUNTER — Ambulatory Visit (HOSPITAL_BASED_OUTPATIENT_CLINIC_OR_DEPARTMENT_OTHER): Payer: Medicare Other | Admitting: Physical Medicine & Rehabilitation

## 2018-02-23 ENCOUNTER — Encounter: Payer: Self-pay | Admitting: Physical Medicine & Rehabilitation

## 2018-02-23 VITALS — BP 127/71 | HR 66 | Ht 63.0 in | Wt 163.0 lb

## 2018-02-23 DIAGNOSIS — E785 Hyperlipidemia, unspecified: Secondary | ICD-10-CM | POA: Insufficient documentation

## 2018-02-23 DIAGNOSIS — Z7982 Long term (current) use of aspirin: Secondary | ICD-10-CM | POA: Diagnosis not present

## 2018-02-23 DIAGNOSIS — G40909 Epilepsy, unspecified, not intractable, without status epilepticus: Secondary | ICD-10-CM | POA: Insufficient documentation

## 2018-02-23 DIAGNOSIS — I6523 Occlusion and stenosis of bilateral carotid arteries: Secondary | ICD-10-CM | POA: Insufficient documentation

## 2018-02-23 DIAGNOSIS — Z951 Presence of aortocoronary bypass graft: Secondary | ICD-10-CM | POA: Diagnosis not present

## 2018-02-23 DIAGNOSIS — Z79899 Other long term (current) drug therapy: Secondary | ICD-10-CM | POA: Insufficient documentation

## 2018-02-23 DIAGNOSIS — E1151 Type 2 diabetes mellitus with diabetic peripheral angiopathy without gangrene: Secondary | ICD-10-CM | POA: Diagnosis not present

## 2018-02-23 DIAGNOSIS — I69354 Hemiplegia and hemiparesis following cerebral infarction affecting left non-dominant side: Secondary | ICD-10-CM | POA: Insufficient documentation

## 2018-02-23 DIAGNOSIS — I5032 Chronic diastolic (congestive) heart failure: Secondary | ICD-10-CM | POA: Insufficient documentation

## 2018-02-23 DIAGNOSIS — I110 Hypertensive heart disease with heart failure: Secondary | ICD-10-CM | POA: Diagnosis not present

## 2018-02-23 DIAGNOSIS — Z794 Long term (current) use of insulin: Secondary | ICD-10-CM | POA: Insufficient documentation

## 2018-02-23 DIAGNOSIS — I2510 Atherosclerotic heart disease of native coronary artery without angina pectoris: Secondary | ICD-10-CM | POA: Diagnosis not present

## 2018-02-23 DIAGNOSIS — Z87891 Personal history of nicotine dependence: Secondary | ICD-10-CM | POA: Insufficient documentation

## 2018-02-23 DIAGNOSIS — E1142 Type 2 diabetes mellitus with diabetic polyneuropathy: Secondary | ICD-10-CM | POA: Diagnosis not present

## 2018-02-23 DIAGNOSIS — I639 Cerebral infarction, unspecified: Secondary | ICD-10-CM | POA: Diagnosis not present

## 2018-02-23 NOTE — Patient Instructions (Signed)
Will repeat same dose of botox next visit, it should start wearing off in ~63month

## 2018-02-23 NOTE — Progress Notes (Signed)
Subjective:    Patient ID: Fred [name], male    DOB: August 13, 1935, 82 y.o.   MRN: [id number] 82 year old right-handed male with history of alcohol use, hypertension, seizure disorder as well as CVA, CAD with CABG, diastolic congestive heart failure.  Lives with spouse, independent prior to admission.  Presented  08/09/2017 with left-sided weakness, facial droop, slurred speech.  Cranial CT scan negative.  CT angiogram of head and neck showed mild less than 50% proximal right ICA stenosis and moderate to severe 70% proximal left ICA stenosis.  MRI showed  subcentimeter acute versus early subacute infarction within the right posterior limb of internal capsule.  Echocardiogram with ejection fraction of 40%, grade I diastolic dysfunction.  Maintained on aspirin and Plavix x3 weeks, then aspirin alone.   HPI Patient feels like his tone has improved after botulinum toxin injection performed on 01/12/2018 on the following muscle groups Left FDS 25 Left FDP 25 Left Pec 75 L FCR 25 L biceps 25 Left Brachiorad 25  Pain Inventory Average Pain 3 Pain Right Now 0 My pain is no pain  In the last 24 hours, has pain interfered with the following? General activity 0 Relation with others 0 Enjoyment of life 0 What TIME of day is your pain at its worst? no pain Sleep (in general) NA  Pain is worse with: no pain Pain improves with: no pain Relief from Meds: no pain  Mobility use a wheelchair needs help with transfers  Function retired  Neuro/Psych No problems in this area  Prior Studies Any changes since last visit?  no  Physicians involved in your care Any changes since last visit?  no   Family History  Problem Relation Age of Onset  . Epilepsy Father   . Stroke Mother   . Lung cancer Unknown    Social History   Socioeconomic History  . Marital status: Married    Spouse name: Jeremaine Maraj  . Number of children: 3  . Years of education: Not on file  . Highest education  level: Not on file  Occupational History  . 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 Financial resource strain: Not on file  . Food insecurity:    Worry: Not on file    Inability: Not on file  . Transportation needs:    Medical: Not on file    Non-medical: Not on file  Tobacco Use  . Smoking status: Former Smoker    Packs/day: 1.00    Years: 22.00    Pack years: 22.00    Types: Cigarettes    Last attempt to quit: 03/14/1977    Years since quitting: 40.9  . Smokeless tobacco: Never Used  Substance and Sexual Activity  . Alcohol use: Yes    Comment: Pt has not had alcohol since 03/2017 CVA  . Drug use: No  . Sexual activity: Never  Lifestyle  . Physical activity:    Days per week: Not on file    Minutes per session: Not on file  . Stress: Not on file  Relationships  . Social connections:    Talks on phone: Not on file    Gets together: Not on file    Attends religious service: Not on file    Active member of club or organization: Not on file    Attends meetings of clubs or organizations: Not on file    Relationship status: Not on file  Other Topics Concern  . Not on file  Social History Narrative   3 caffeinated drinks per day. Mostly coffee. He walks 15 minutes 3 days per week. Quit smoking in 1978.      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    Past Surgical History:  Procedure Laterality Date  . CATARACT EXTRACTION W/ INTRAOCULAR LENS  IMPLANT, BILATERAL  06/2013   Dr. [name]  . CORONARY ARTERY BYPASS GRAFT  02/13/2004   4 vesel  . removal of sebaceous cyst from neck  08/2007   Dr. [name]  . TONSILLECTOMY     Past Medical History:  Diagnosis Date  . Atrial fibrillation ([place])    Post operative  . CAD (coronary artery disease)    s/p cabg  . CHF (congestive heart failure) ([place])   . Diverticulosis   . DJD  (degenerative joint disease)   . DM (diabetes mellitus) ([place])   . History of anemia   . History of stroke   . Hyperlipidemia   . Hypertension   . 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 Peripheral vascular disease ([place])   . Polyneuropathy, diabetic ([place])   . Seizure disorder, complex partial ([place])   . Syncope and collapse    BP 127/71   Pulse 66   Ht _0  (1.6 m) Comment: last reported  Wt 163 lb (73.9 kg) Comment: lst reported  SpO2 95%   BMI 28.87 kg/m   Opioid Risk Score:   Fall Risk Score:  `1  Depression screen PHQ 2/9  Depression screen [facility] 2/9 12/19/2017 09/26/2017 09/25/2017 02/08/2017 05/26/2016 05/05/2016 12/22/2015  Decreased Interest 0 0 1 0 0 0 0  Down, Depressed, Hopeless 0 2 1 0 0 0 0  PHQ - 2 Score 0 2 2 0 0 0 0  Altered sleeping - 2 0 - - - -  Tired, decreased energy - 2 2 - - - -  Change in appetite - 2 3 - - - -  Feeling bad or failure about yourself  - 0 0 - - - -  Trouble concentrating - 0 0 - - - -  Moving slowly or fidgety/restless - 0 0 - - - -  Suicidal thoughts - 0 0 - - - -  PHQ-9 Score - 8 7 - - - -  Difficult doing work/chores - Somewhat difficult Somewhat difficult - - - -  Some recent data might be hidden   Review of Systems  Constitutional: Negative.   HENT: Negative.   Eyes: Negative.   Respiratory: Negative.   Cardiovascular: Negative.   Gastrointestinal: Negative.   Endocrine: Negative.   Genitourinary: Negative.   Musculoskeletal: Positive for gait problem.  Skin: Negative.   Allergic/Immunologic: Negative.   Neurological: Positive for weakness.  Hematological: Bruises/bleeds easily.       Plavix  Psychiatric/Behavioral: Negative.   All other systems reviewed and are negative.      Objective:   Physical Exam Vitals signs and nursing note reviewed.  Constitutional:      Appearance: Normal appearance.  HENT:     Head: Normocephalic and atraumatic.  Eyes:     Extraocular Movements: Extraocular movements  intact.     Pupils: Pupils are equal, round, and reactive to light.  Neck:     Musculoskeletal: Normal range of motion.  Skin:    General: Skin is  warm and dry.  Neurological:     General: No focal deficit present.     Mental Status: He is alert and oriented to person, place, and time.  Psychiatric:        Mood and Affect: Mood normal.        Behavior: Behavior normal.   Motor strength is 5/5 right deltoid bicep tricep grip hip flexor knee extensor ankle dorsiflexor Trace left deltoid 2- bicep and tricep trace thumb flexion 0 finger extension trace finger flexion Tone MAS 1 at the elbow flexors 1 at the wrist flexor and 1 at the finger flexors 2 at the thumb flexors Motor strength in the left lower extremity is 2- hip knee extensor synergy 0 at the ankle      Assessment & Plan:  #1.  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

## 2018-02-27 ENCOUNTER — Ambulatory Visit: Payer: Medicare Other | Admitting: Rehabilitation

## 2018-02-27 ENCOUNTER — Encounter: Payer: Self-pay | Admitting: Occupational Therapy

## 2018-02-27 ENCOUNTER — Ambulatory Visit: Payer: Medicare Other | Admitting: Occupational Therapy

## 2018-02-27 DIAGNOSIS — M6281 Muscle weakness (generalized): Secondary | ICD-10-CM

## 2018-02-27 DIAGNOSIS — R2681 Unsteadiness on feet: Secondary | ICD-10-CM

## 2018-02-27 DIAGNOSIS — M25612 Stiffness of left shoulder, not elsewhere classified: Secondary | ICD-10-CM

## 2018-02-27 DIAGNOSIS — I69354 Hemiplegia and hemiparesis following cerebral infarction affecting left non-dominant side: Secondary | ICD-10-CM | POA: Diagnosis not present

## 2018-02-27 DIAGNOSIS — M25532 Pain in left wrist: Secondary | ICD-10-CM

## 2018-02-27 DIAGNOSIS — R2689 Other abnormalities of gait and mobility: Secondary | ICD-10-CM | POA: Diagnosis not present

## 2018-02-27 DIAGNOSIS — R293 Abnormal posture: Secondary | ICD-10-CM

## 2018-02-27 DIAGNOSIS — G8929 Other chronic pain: Secondary | ICD-10-CM

## 2018-02-27 DIAGNOSIS — M25512 Pain in left shoulder: Secondary | ICD-10-CM | POA: Diagnosis not present

## 2018-02-27 DIAGNOSIS — M25632 Stiffness of left wrist, not elsewhere classified: Secondary | ICD-10-CM

## 2018-02-27 NOTE — Therapy (Signed)
[facility] [address] [place], [place], [address] Phone: [phone]   Fax:  [phone]  Occupational Therapy Treatment  Patient Details  Name: Fred [name] MRN: [id number] Date of Birth: 1935/10/08 Referring Provider (OT): Dr. Alysia Penna   Encounter Date: 02/27/2018  OT End of Session - 02/27/18 2048    Visit Number  19    Number of Visits  24    Date for OT Re-Evaluation  03/08/18    Authorization Type  Medicare, Tricare, UHC - pt will need PN every 10th visit    Authorization Time Period  90 days    Authorization - Visit Number  66    Authorization - Number of Visits  20    OT Start Time  1335   pt arrived late   OT Stop Time  1425    OT Time Calculation (min)  50 min    Activity Tolerance  Patient tolerated treatment well       Past Medical History:  Diagnosis Date  . Atrial fibrillation ([place])    Post operative  . CAD (coronary artery disease)    s/p cabg  . CHF (congestive heart failure) ([place])   . Diverticulosis   . DJD (degenerative joint disease)   . DM (diabetes mellitus) ([place])   . History of anemia   . History of stroke   . Hyperlipidemia   . Hypertension   . 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 Peripheral vascular disease ([place])   . Polyneuropathy, diabetic ([place])   . Seizure disorder, complex partial ([place])   . Syncope and collapse     Past Surgical History:  Procedure Laterality Date  . CATARACT EXTRACTION W/ INTRAOCULAR LENS  IMPLANT, BILATERAL  06/2013   Dr. [name]  . CORONARY ARTERY BYPASS GRAFT  02/13/2004   4 vesel  . removal of sebaceous cyst from neck  08/2007   Dr. [name]  . TONSILLECTOMY      There were no vitals filed for this visit.  Subjective Assessment - 02/27/18 2046    Subjective   I can tell I am falling to the left and back    Patient is accompained by:  Family member   wife and grandson   Pertinent History  [name]  08/09/2017. DIscharged home on 09/22/2017 after inpt rehab stay. Pt had HH PT and OT. PMH:      Patient Stated Goals  I want to get walking again.  I am an outdoor guy    Currently in Pain?  No/denies       Patient seen for aquatic therapy today.  Treatment took place in water 2.5-4 feet deep depending upon activity.  Pt entered the pool via water wheelchair on ramp part way. Then pt ambulated with min a using hand rail on R and build up shoe to address leg length discrepancy.  Pt needed step by step cuing in order to activate LLE , trunk and to transition weight forward over LLE.  Treatment focused on finding and maintaining midline first with light RUE support and then progressing to no UE support.  Drag and resistance in water allows pt to received feedback about where his COG is over his BOS (pt is biased posteriorly and to the left).  Pt now able to identify however does not exhibit any balance reactions to correct balance. With max cues pt is beginning to be able to shift weight to midline with decreased reliance  on pulling with RUE.  Pt was able to maintain midline today for brief periods without any UE support.  Also addressed LLE and trunk activation via using bottom step and using LLE to step on step with mod a and max cues.  Pt relies heavily on RUE for step up.  Addressed functional ambulation with water walker today- pt's bias much more evident when pt has less UE support. Pt requires mod assist for this activity with max cues to shift weight R and forward.  Pt with increased activity with LLE for stepping today in all activities.  Also addressed functional ambulation with pt using pool wall with RUE for support - pt only needing min a for forward ambulation and mod a for backward ambulation - max cues.  Addressed upper trunk rotation to the R to improve postural alignment and control. Pt exited pool via ramp completing side stepping using hand rail for partial ramp with min a and max cues.  Pt  assisted out of the remainder of the pool via water wheelchair. Pt utilized side stepping on land to transfer from water wheelchair to regular wheelchair with min a.                        OT Short Term Goals - 02/22/18 1126      OT SHORT TERM GOAL #1   Title  Pt and family will be mod I with basic home exercise/home activities program for LUE, balance and functional mobility - 01/25/2018    Status  Achieved      OT SHORT TERM GOAL #2   Title  Pt will be supervision with UB bathing    Status  Achieved   pt is able to do however wife continues to assist     OT SHORT TERM GOAL #3   Title  Pt will be min a for LB bathing    Status  Achieved      OT SHORT TERM GOAL #4   Title  Pt will be close supervision for toilet transfers    Status  Achieved      OT SHORT TERM GOAL #5   Title  Pt will be min a for tub bench transfers (assist with LLE)    Status  Achieved      OT SHORT TERM GOAL #6   Title  Pt will tolerate shoulder flexion to 110* with no pain in supine in prep for HEP and low functional reach    Status  Achieved      OT SHORT TERM GOAL #7   Title  Pt will demonstrate shoulder flexion to at least 30* in prep for low reach    Status  On-going      OT SHORT TERM GOAL #8   Title  Pt will demonstrate ability to hold light objects in L hand while manipulating with R hand (i.e opening containers) with min a    Status  On-going      OT SHORT TERM GOAL  #9   TITLE  Pt will be mod a for LB dressing    Status  Achieved      OT SHORT TERM GOAL  #10   TITLE  Pt/family will be mod I with splint wear and care, prn    Status  Achieved        OT Long Term Goals - 02/22/18 1126      OT LONG TERM GOAL #1   Title  Pt  and family will be mod I with upgraded HEP/home activities program - 03/08/2018    Status  On-going      OT LONG TERM GOAL #2   Title  Pt will be min a for LB dressing    Status  On-going      OT LONG TERM GOAL #3   Title  Pt will be mod I with  toilet transfers    Status  On-going      OT LONG TERM GOAL #4   Title  Pt will be mod I with clothing manipulation with toileting activities    Status  On-going      OT LONG TERM GOAL #5   Title  Pt will be min a for simple familiar hot meal prep    Status  On-going      OT LONG TERM GOAL #6   Title  Pt will demonstate ability to use LUE as stabilzer at least 25% of the time during basic self care and fishing activities.    Status  On-going      OT LONG TERM GOAL #7   Title  Pt will be mod I with casting simple fishing pole    Status  On-going      OT LONG TERM GOAL #8   Title  Pt will demonstate adequate postural alignment and control to complete simple activities in standing without UE support.     Status  On-going            Plan - 02/27/18 2047    Clinical Impression Statement  Pt continues with slow progress toward goals.  Pt with improving awareness about position in space.     Occupational Profile and client history currently impacting functional performance  PMH: husband, father, grand father, retiree.  Pt loves the outdoors and fishing.  PMH: CAD, S/P CABG, DM, HTN, HLD, AFIB, CHF, seizure, PVD, obesity, ETHO abuse, TIA in 03/2017.      Occupational performance deficits (Please refer to evaluation for details):  ADL's;IADL's;Leisure;Social Participation;Rest and Sleep    Rehab Potential  Good    Current Impairments/barriers affecting progress:  medical history, severity of motor deficits, limited family suppor for carryover as pt's wife has cognitive decline as well    OT Frequency  2x / week    OT Duration  12 weeks    OT Treatment/Interventions  Self-care/ADL training;Aquatic Therapy;Electrical Stimulation;Moist Heat;Fluidtherapy;Ultrasound;Therapeutic exercise;Neuromuscular education;Energy conservation;DME and/or AE instruction;Passive range of motion;Manual Therapy;Functional Mobility Training;Splinting;Therapeutic activities;Patient/family education;Balance  training    Plan  check LB dressing, manual therapy to address LUE shouler girdle alignment, NMR for LUE, balance, functional mobility, ADL's, aquatic therapy     Consulted and Agree with Plan of Care  Patient;Family member/caregiver    Family Member Consulted  grandson and wife       Patient will benefit from skilled therapeutic intervention in order to improve the following deficits and impairments:  Abnormal gait, Decreased balance, Decreased range of motion, Decreased mobility, Decreased knowledge of use of DME, Decreased strength, Difficulty walking, Impaired UE functional use, Impaired tone, Impaired flexibility, Pain  Visit Diagnosis: Unsteadiness on feet  Muscle weakness (generalized)  Abnormal posture  Spastic hemiplegia of left nondominant side as late effect of cerebral infarction (HCC)  Chronic left shoulder pain  Pain in left wrist  Stiffness of left wrist, not elsewhere classified  Stiffness of left shoulder, not elsewhere classified    Problem List Patient Active Problem List   Diagnosis Date Noted  . 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 Acute blood loss anemia   . Chronic diastolic (congestive) heart failure ([place])   . Hemiparesis affecting left side as late effect of cerebrovascular accident ([place])   . Diabetes mellitus type 2 in obese ([place])   . Small vessel disease ([place]) 08/11/2017  . Left hemiparesis ([place])   . Benign essential hypertension with delivery   . CVA (cerebral vascular accident) ([place]) 08/10/2017  . 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 Primary osteoarthritis of both knees 05/11/2017  . Stroke (cerebrum) ([place]) 03/21/2017  . Type 2 diabetes mellitus with complication, with long-term current use of insulin ([place]) 03/27/2015  . Atherosclerosis of native coronary artery of native heart  without angina pectoris 03/27/2015  . Palpitations 11/04/2014  . S/P CABG (coronary artery bypass graft) 11/04/2014  . Atrial fibrillation, unspecified   . History of stroke 09/04/2014  . Obesity (BMI 30-39.9) 11/29/2013  . History of gout 05/16/2013  . Congestive heart failure ([place]) 04/20/2009  . DYSPNEA 02/11/2009  . CARDIOVASCULAR FUNCTION STUDY, ABNORMAL 01/29/2009  . NECK PAIN 08/03/2007  . ANEMIA / OTHER 05/16/2007  . Coronary atherosclerosis 05/16/2007  . PAROXYSMAL ATRIAL FIBRILLATION 05/16/2007  . Subcortical infarction ([place]) 05/16/2007  . Hyperlipidemia 03/30/2007  . DEGENERATIVE JOINT DISEASE 03/30/2007  . PAROTID LESION, UNSPECIFIED 09/29/2006  . Partial epilepsy with impairment of consciousness ([place]) 09/29/2006  . Diabetic polyneuropathy ([place]) 09/29/2006  . Essential hypertension 09/29/2006  . PERIPHERAL VASCULAR DISEASE 09/29/2006  . DIVERTICULOSIS 09/29/2006    Quay Burow, OTR/L 02/27/2018, 8:50 PM  [facility] [address] [place], [place], [address] Phone: [phone]   Fax:  [phone]  Name: Fred [name] MRN: [id number] Date of Birth: 01/17/1936

## 2018-02-28 ENCOUNTER — Encounter: Payer: Self-pay | Admitting: Rehabilitation

## 2018-02-28 NOTE — Therapy (Signed)
[facility] [address] [place], [place], [address] Phone: [phone]   Fax:  [phone]  Physical Therapy Treatment  Patient Details  Name: Fred [name] MRN: [id number] Date of Birth: 09/11/1935 Referring Provider (PT): Dr. Letta Pate   Encounter Date: 02/27/2018  PT End of Session - 02/28/18 0859    Visit Number  18    Number of Visits  30    Date for PT Re-Evaluation  04/13/18    Authorization Type  Medicare, UHC, Tricare (no PTAs). Progress note every 10th visit.     PT Start Time  1153    PT Stop Time  1235    PT Time Calculation (min)  42 min    Equipment Utilized During Treatment  --   mod A -S prn   Activity Tolerance  Patient tolerated treatment well    Behavior During Therapy  WFL for tasks assessed/performed       Past Medical History:  Diagnosis Date  . Atrial fibrillation ([place])    Post operative  . CAD (coronary artery disease)    s/p cabg  . CHF (congestive heart failure) ([place])   . Diverticulosis   . DJD (degenerative joint disease)   . DM (diabetes mellitus) ([place])   . History of anemia   . History of stroke   . Hyperlipidemia   . Hypertension   . 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 Peripheral vascular disease ([place])   . Polyneuropathy, diabetic ([place])   . Seizure disorder, complex partial ([place])   . Syncope and collapse     Past Surgical History:  Procedure Laterality Date  . CATARACT EXTRACTION W/ INTRAOCULAR LENS  IMPLANT, BILATERAL  06/2013   Dr. [name]  . CORONARY ARTERY BYPASS GRAFT  02/13/2004   4 vesel  . removal of sebaceous cyst from neck  08/2007   Dr. [name]  . TONSILLECTOMY      There were no vitals filed for this visit.  Subjective Assessment - 02/28/18 0851    Subjective  Pt reporting he got new Lucianne Lei and that regular w/c will not harness down to [place], therefore has to use power w/c in this van.      Patient is accompained by:  Family  member    Pertinent History  DM with polyneuropathy, hx of cataracts, hx of CVA in 03/2017 and again 07/2017, OA in R knee, DJD, HTN, HLD, coronary atherosclerosis, a-fib, CHF, PVD, parotid lesion, diverticulitis, partial epilepsy, anemia    Patient Stated Goals  get stronger; able to walk and transfer better    Currently in Pain?  No/denies                       [facility] Adult PT Treatment/Exercise - 02/27/18 1155      Transfers   Transfers  Sit to Stand;Stand to Sit    Sit to Stand  4: Min guard    Sit to Stand Details  Tactile cues for placement;Verbal cues for technique;Verbal cues for sequencing;Verbal cues for safe use of DME/AE;Manual facilitation for weight shifting    Sit to Stand Details (indicate cue type and reason)  Min facilitation and cues for forward weight shift.     Stand to Sit  4: Min assist    Stand to Sit Details (indicate cue type and reason)  Verbal cues for sequencing;Verbal cues for technique;Manual facilitation for weight shifting    Stand to Sit Details  Cues for forward trunk lean for controlled descent.       Ambulation/Gait   Ambulation/Gait  Yes    Ambulation/Gait Assistance  4: Min assist;3: Mod assist    Ambulation/Gait Assistance Details  Began gait duirng session with RW, L hand orthosis, L AFO and R shoe lift in order to progress towards this device at home.  Pt requires assist to advance LLE at all times despite having toe cap.  Following approx 10', had pt sit and donned surgical shoe cover which assisted greatly with L foot clearance (disucssed how he made need to go see Gerald Stabs at [facility] to increase size of current toe cap to cover more of foot/shoe).  Performed two short bouts of approx 10' with RW with min/mod A (for clearing LLE/proper placement) with light tactile and verbal cues for improving R lateral weight shift and forward weight shift onto LLE during L stance. Grandson present during session asking about doing this at home.  Discussed  that he is not quite ready to ambulate safely with RW with family at home, but to keep working on with hemi walker.  PT then got hemi walker and demonstrated how to assist.  PT then demonstrated on grandson (grandson as pt) where to place hands, what to feel for and how to cue pt during gait.  Grandson did well and seems to really want to push for family to come and do this with him daily.  Pt and grandson ambulated approx 8' before increased fatigue.      Ambulation Distance (Feet)  10 Feet   x 3, 8' x 2    Assistive device  Rolling walker;Hemi-walker   L hand orthosis, L AFO, R shoe lit    Gait Pattern  Step-to pattern;Step-through pattern;Decreased step length - left;Decreased step length - right;Decreased stride length;Decreased hip/knee flexion - left;Decreased dorsiflexion - left;Decreased weight shift to right;Lateral trunk lean to left;Poor foot clearance - left    Ambulation Surface  Level;Indoor             PT Education - 02/28/18 [id number]    Education Details  see gait section on education to grandson regarding gait with hemi walker at home    Person(s) Educated  Patient;Child(ren)    Methods  Explanation;Demonstration;Tactile cues;Verbal cues    Comprehension  Verbalized understanding;Returned demonstration;Tactile cues required;Need further instruction;Verbal cues required       PT Short Term Goals - 02/06/18 1305      PT SHORT TERM GOAL #1   Title  Pt will perform standing and strengthening HEP with supervision of family    Time  4    Period  Weeks    Status  Revised    Target Date  03/14/18      PT SHORT TERM GOAL #2   Title  Pt will perform stand pivot transfers with AFO, shoe lift and RW with min A to L and R    Time  4    Period  Weeks    Status  Revised    Target Date  03/14/18      PT SHORT TERM GOAL #3   Title  Pt will improve BERG balance test by 8 points     Baseline  9/56    Time  4    Period  Weeks    Status  Revised    Target Date  03/14/18       PT SHORT TERM GOAL #4   Title  Pt will ambulate x  42' with RW, AFO and shoe lift with consistent Min A    Baseline  40' with RW and mod A, AFO    Time  4    Period  Weeks    Status  Revised    Target Date  03/14/18      PT SHORT TERM GOAL #5   Title  Pt will demonstrate safe power w/c mobility over indoor and outdoor surfaces >500' MOD I and negotiate ramped surfaces and around obstacles safely    Time  4    Period  Weeks    Status  New    Target Date  03/14/18        PT Long Term Goals - 02/06/18 1321      PT LONG TERM GOAL #1   Title  Pt will demonstrate ability to perform final HEP and walking program with supervision of family    Time  8    Period  Weeks    Status  New    Target Date  04/13/18      PT LONG TERM GOAL #2   Title  Pt will ambulate x 200' over indoor surfaces with LRAD, AFO and shoe lift with supervision    Time  8    Period  Weeks    Status  Revised    Target Date  04/13/18      PT LONG TERM GOAL #3   Title  Pt will perform stand pivot transfers with LRAD, AFO and shoe lift with supervision to L and R    Time  8    Period  Weeks    Status  New    Target Date  04/13/18      PT LONG TERM GOAL #4   Title  Pt will improve BERG score to >/=20/56 to decr. falls risk.     Time  8    Period  Weeks    Status  New    Target Date  04/13/17            Plan - 02/28/18 0859    Clinical Impression Statement  Skilled session focused on discussion of aquatic OT moving to Mondays in Jan, adding PT/OT appts for Jan, reason for not wanting pt in power w/c all the time and gait training with RW initially but then with hemi walker so that grandson may assist at home.  Grandson reports that he had no idea this is what needed to be happening.  He seems to be motivated to get family involved in helping with this at home.  Also provided pt/grandson with print out of HEP as pt can at least stand at home with S from wife at sink with chair behind him.      Rehab  Potential  Good    Clinical Impairments Affecting Rehab Potential  left side weakness; poor left side motor control    PT Frequency  2x / week    PT Duration  8 weeks    PT Treatment/Interventions  ADLs/Self Care Home Management;Biofeedback;Canalith Repostioning;Electrical Stimulation;Therapeutic activities;Therapeutic exercise;Manual techniques;Vestibular;Wheelchair mobility training;Functional mobility training;Orthotic Fit/Training;Stair training;Gait training;Patient/family education;DME Instruction;Neuromuscular re-education;Balance training    PT Next Visit Plan  Continue gait training with family as they are at therapy with hemi walker and with RW as able.   NMR: focus on active weight shift to R and activation of LLE during swing phase and sit <> stand.  cont to work on transfers (STS and stand pivot with RW) ;make sure he  is getting weight shift correct;  scifit was good tool for recipriocal mvt and UE involvement; he wants to try to get all the way around the gym with (RW with L h/o)    Consulted and Agree with Plan of Care  Patient;Family member/caregiver    Family Member Consulted  daughter       Patient will benefit from skilled therapeutic intervention in order to improve the following deficits and impairments:  Abnormal gait, Decreased endurance, Decreased knowledge of use of DME, Decreased strength, Impaired UE functional use, Decreased balance, Decreased mobility, Decreased range of motion, Decreased coordination, Impaired flexibility, Postural dysfunction  Visit Diagnosis: Unsteadiness on feet  Muscle weakness (generalized)  Abnormal posture  Other abnormalities of gait and mobility  Hemiplegia and hemiparesis following cerebral infarction affecting left non-dominant side [facility])     Problem List Patient Active Problem List   Diagnosis Date Noted  . 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 Acute blood loss anemia   . Chronic  diastolic (congestive) heart failure ([place])   . Hemiparesis affecting left side as late effect of cerebrovascular accident ([place])   . Diabetes mellitus type 2 in obese ([place])   . Small vessel disease ([place]) 08/11/2017  . Left hemiparesis ([place])   . Benign essential hypertension with delivery   . CVA (cerebral vascular accident) ([place]) 08/10/2017  . 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 of localized onset, not intractable, without status epilepticus ([place]) 06/20/2017  . Primary osteoarthritis of both knees 05/11/2017  . Stroke (cerebrum) ([place]) 03/21/2017  . Type 2 diabetes mellitus with complication, with long-term current use of insulin ([place]) 03/27/2015  . Atherosclerosis of native coronary artery of native heart without angina pectoris 03/27/2015  . Palpitations 11/04/2014  . S/P CABG (coronary artery bypass graft) 11/04/2014  . Atrial fibrillation, unspecified   . History of stroke 09/04/2014  . Obesity (BMI 30-39.9) 11/29/2013  . History of gout 05/16/2013  . Congestive heart failure ([place]) 04/20/2009  . DYSPNEA 02/11/2009  . CARDIOVASCULAR FUNCTION STUDY, ABNORMAL 01/29/2009  . NECK PAIN 08/03/2007  . ANEMIA / OTHER 05/16/2007  . Coronary atherosclerosis 05/16/2007  . PAROXYSMAL ATRIAL FIBRILLATION 05/16/2007  . Subcortical infarction ([place]) 05/16/2007  . Hyperlipidemia 03/30/2007  . DEGENERATIVE JOINT DISEASE 03/30/2007  . PAROTID LESION, UNSPECIFIED 09/29/2006  . Partial epilepsy with impairment of consciousness ([place]) 09/29/2006  . Diabetic polyneuropathy ([place]) 09/29/2006  . Essential hypertension 09/29/2006  . PERIPHERAL VASCULAR DISEASE 09/29/2006  . DIVERTICULOSIS 09/29/2006   Cameron Sprang, PT, MPT [facility] [address] [place], [place], [address] Phone: [phone]   Fax:  [phone] 02/28/18, 9:03 AM  Name: Fred [name] MRN: [id number] Date of Birth: January 16, 1936

## 2018-03-01 ENCOUNTER — Ambulatory Visit: Payer: Medicare Other | Admitting: Physical Therapy

## 2018-03-01 ENCOUNTER — Encounter: Payer: Self-pay | Admitting: Occupational Therapy

## 2018-03-01 ENCOUNTER — Ambulatory Visit: Payer: Medicare Other | Admitting: Occupational Therapy

## 2018-03-01 ENCOUNTER — Encounter: Payer: Self-pay | Admitting: Physical Therapy

## 2018-03-01 DIAGNOSIS — G8929 Other chronic pain: Secondary | ICD-10-CM

## 2018-03-01 DIAGNOSIS — M6281 Muscle weakness (generalized): Secondary | ICD-10-CM

## 2018-03-01 DIAGNOSIS — M25512 Pain in left shoulder: Secondary | ICD-10-CM | POA: Diagnosis not present

## 2018-03-01 DIAGNOSIS — R2689 Other abnormalities of gait and mobility: Secondary | ICD-10-CM | POA: Diagnosis not present

## 2018-03-01 DIAGNOSIS — R293 Abnormal posture: Secondary | ICD-10-CM | POA: Diagnosis not present

## 2018-03-01 DIAGNOSIS — M25632 Stiffness of left wrist, not elsewhere classified: Secondary | ICD-10-CM

## 2018-03-01 DIAGNOSIS — M25532 Pain in left wrist: Secondary | ICD-10-CM

## 2018-03-01 DIAGNOSIS — R2681 Unsteadiness on feet: Secondary | ICD-10-CM | POA: Diagnosis not present

## 2018-03-01 DIAGNOSIS — M25612 Stiffness of left shoulder, not elsewhere classified: Secondary | ICD-10-CM

## 2018-03-01 DIAGNOSIS — I69354 Hemiplegia and hemiparesis following cerebral infarction affecting left non-dominant side: Secondary | ICD-10-CM

## 2018-03-01 NOTE — Therapy (Signed)
[facility] [address] [place], [place], [address] Phone: [phone]   Fax:  [phone]  Occupational Therapy Treatment  Patient Details  Name: Fred [name] MRN: [id number] Date of Birth: 10/29/1935 Referring Provider (OT): Dr. Alysia Penna   Encounter Date: 03/01/2018  OT End of Session - 03/01/18 1155    Visit Number  20    Number of Visits  24    Date for OT Re-Evaluation  05/24/18    Authorization Type  Medicare, Tricare, UHC - pt will need PN every 10th visit    Authorization Time Period  90 days    Authorization - Visit Number  48    Authorization - Number of Visits  30    OT Start Time  1102    OT Stop Time  1145    OT Time Calculation (min)  43 min    Activity Tolerance  Patient tolerated treatment well       Past Medical History:  Diagnosis Date  . Atrial fibrillation ([place])    Post operative  . CAD (coronary artery disease)    s/p cabg  . CHF (congestive heart failure) ([place])   . Diverticulosis   . DJD (degenerative joint disease)   . DM (diabetes mellitus) ([place])   . History of anemia   . History of stroke   . Hyperlipidemia   . Hypertension   . 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 Peripheral vascular disease ([place])   . Polyneuropathy, diabetic ([place])   . Seizure disorder, complex partial ([place])   . Syncope and collapse     Past Surgical History:  Procedure Laterality Date  . CATARACT EXTRACTION W/ INTRAOCULAR LENS  IMPLANT, BILATERAL  06/2013   Dr. [name]  . CORONARY ARTERY BYPASS GRAFT  02/13/2004   4 vesel  . removal of sebaceous cyst from neck  08/2007   Dr. [name]  . TONSILLECTOMY      There were no vitals filed for this visit.  Subjective Assessment - 03/01/18 1118    Subjective   I can tell I am falling backward.    Patient is accompained by:  Family member   son in law   Pertinent History  [name] 08/09/2017. DIscharged home on 09/22/2017 after  inpt rehab stay. Pt had HH PT and OT. PMH:      Patient Stated Goals  I want to get walking again.  I am an outdoor guy    Currently in Pain?  No/denies                   OT Treatments/Exercises (OP) - 03/01/18 0001      Neurological Re-education Exercises   Other Exercises 1  Neuro re ed to address functional mobililty including sit to stand, functional ambulation with RW, static standing balance with emphasis on alignment and postural control and weight shiftingt to the right with only light UE support. Also addresed controlled stand to sit.  In supine, addressed upgrading HEP for LUE - see pt instruction section for details.  After instruction and practice pt able to return demonstrate and son in law able to verbalize.  Addressed stand step transfer from mat to wheelchair - pt needs min a with mod cues.              OT Education - 03/01/18 1151    Education Details  upgraded HEP for LUE    Person(s) Educated  Patient;Other (comment)  son in law   Methods  Explanation    Comprehension  Verbalized understanding;Returned demonstration       OT Short Term Goals - 03/01/18 1152      OT SHORT TERM GOAL #1   Title  Pt and family will be mod I with basic home exercise/home activities program for LUE, balance and functional mobility - 01/25/2018    Status  Achieved      OT SHORT TERM GOAL #2   Title  Pt will be supervision with UB bathing    Status  Achieved   pt is able to do however wife continues to assist     OT SHORT TERM GOAL #3   Title  Pt will be min a for LB bathing    Status  Achieved      OT SHORT TERM GOAL #4   Title  Pt will be close supervision for toilet transfers    Status  Achieved      OT SHORT TERM GOAL #5   Title  Pt will be min a for tub bench transfers (assist with LLE)    Status  Achieved      OT SHORT TERM GOAL #6   Title  Pt will tolerate shoulder flexion to 110* with no pain in supine in prep for HEP and low functional reach     Status  Achieved      OT SHORT TERM GOAL #7   Title  Pt will demonstrate shoulder flexion to at least 30* in prep for low reach - 04/12/2018    Status  On-going      OT SHORT TERM GOAL #8   Title  Pt will demonstrate ability to hold light objects in L hand while manipulating with R hand (i.e opening containers) with min a    Status  On-going      OT SHORT TERM GOAL  #9   TITLE  Pt will be mod a for LB dressing    Status  Achieved      OT SHORT TERM GOAL  #10   TITLE  Pt/family will be mod I with splint wear and care, prn    Status  Achieved        OT Long Term Goals - 03/01/18 1153      OT LONG TERM GOAL #1   Title  Pt and family will be mod I with upgraded HEP/home activities program - 05/24/2018    Status  On-going      OT LONG TERM GOAL #2   Title  Pt will be min a for LB dressing    Status  On-going      OT LONG TERM GOAL #3   Title  Pt will be mod I with toilet transfers    Status  On-going      OT LONG TERM GOAL #4   Title  Pt will be mod I with clothing manipulation with toileting activities    Status  On-going      OT LONG TERM GOAL #5   Title  Pt will be min a for simple familiar hot meal prep    Status  On-going      OT LONG TERM GOAL #6   Title  Pt will demonstate ability to use LUE as stabilzer at least 25% of the time during basic self care and fishing activities.    Status  On-going      OT LONG TERM GOAL #7   Title  Pt will  be mod I with casting simple fishing pole    Status  On-going      OT LONG TERM GOAL #8   Title  Pt will demonstate adequate postural alignment and control to complete simple activities in standing without UE support.     Status  On-going            Plan - 03/01/18 1154    Clinical Impression Statement  Pt with very slow progress toward goals. Pt will benefit from continued therapy to progress toward goals - will renew today. Pt in agreement.     Occupational Profile and client history currently impacting functional  performance  PMH: husband, father, grand father, retiree.  Pt loves the outdoors and fishing.  PMH: CAD, S/P CABG, DM, HTN, HLD, AFIB, CHF, seizure, PVD, obesity, ETHO abuse, TIA in 03/2017.      Occupational performance deficits (Please refer to evaluation for details):  ADL's;IADL's;Leisure;Social Participation;Rest and Sleep    Rehab Potential  Good    Current Impairments/barriers affecting progress:  medical history, severity of motor deficits, limited family suppor for carryover as pt's wife has cognitive decline as well    OT Frequency  2x / week    OT Duration  12 weeks    OT Treatment/Interventions  Self-care/ADL training;Aquatic Therapy;Electrical Stimulation;Moist Heat;Fluidtherapy;Ultrasound;Therapeutic exercise;Neuromuscular education;Energy conservation;DME and/or AE instruction;Passive range of motion;Manual Therapy;Functional Mobility Training;Splinting;Therapeutic activities;Patient/family education;Balance training    Plan   manual therapy to address LUE shouler girdle alignment, NMR for LUE, balance, functional mobility, ADL's, aquatic therapy     Consulted and Agree with Plan of Care  Patient;Family member/caregiver    Family Member Consulted  son in law       Patient will benefit from skilled therapeutic intervention in order to improve the following deficits and impairments:  Abnormal gait, Decreased balance, Decreased range of motion, Decreased mobility, Decreased knowledge of use of DME, Decreased strength, Difficulty walking, Impaired UE functional use, Impaired tone, Impaired flexibility, Pain  Visit Diagnosis: Unsteadiness on feet  Muscle weakness (generalized)  Abnormal posture  Spastic hemiplegia of left nondominant side as late effect of cerebral infarction (HCC)  Chronic left shoulder pain  Pain in left wrist  Stiffness of left wrist, not elsewhere classified  Stiffness of left shoulder, not elsewhere classified    Problem List Patient Active Problem  List   Diagnosis Date Noted  . 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 Acute blood loss anemia   . Chronic diastolic (congestive) heart failure ([place])   . Hemiparesis affecting left side as late effect of cerebrovascular accident ([place])   . Diabetes mellitus type 2 in obese ([place])   . Small vessel disease ([place]) 08/11/2017  . Left hemiparesis ([place])   . Benign essential hypertension with delivery   . CVA (cerebral vascular accident) ([place]) 08/10/2017  . 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 of localized onset, not intractable, without status epilepticus ([place]) 06/20/2017  . Primary osteoarthritis of both knees 05/11/2017  . Stroke (cerebrum) ([place]) 03/21/2017  . Type 2 diabetes mellitus with complication, with long-term current use of insulin ([place]) 03/27/2015  . Atherosclerosis of native coronary artery of native heart without angina pectoris 03/27/2015  . Palpitations 11/04/2014  . S/P CABG (coronary artery bypass graft) 11/04/2014  . Atrial fibrillation, unspecified   . History of stroke 09/04/2014  . Obesity (BMI 30-39.9) 11/29/2013  . History of gout 05/16/2013  . Congestive heart failure ([place]) 04/20/2009  . DYSPNEA 02/11/2009  .  CARDIOVASCULAR FUNCTION STUDY, ABNORMAL 01/29/2009  . NECK PAIN 08/03/2007  . ANEMIA / OTHER 05/16/2007  . Coronary atherosclerosis 05/16/2007  . PAROXYSMAL ATRIAL FIBRILLATION 05/16/2007  . Subcortical infarction ([place]) 05/16/2007  . Hyperlipidemia 03/30/2007  . DEGENERATIVE JOINT DISEASE 03/30/2007  . PAROTID LESION, UNSPECIFIED 09/29/2006  . Partial epilepsy with impairment of consciousness ([place]) 09/29/2006  . Diabetic polyneuropathy ([place]) 09/29/2006  . Essential hypertension 09/29/2006  . PERIPHERAL VASCULAR DISEASE 09/29/2006  . DIVERTICULOSIS 09/29/2006   Occupational Therapy Progress Note  Dates of Reporting Period: 01/23/2018 to 03/01/2018  Objective Reports of  Subjective Statement: see above  Objective Measurements: see above  Goal Update: see above  Plan: see above  Reason Skilled Services are Required: see above  Quay Burow, OTR/L 03/01/2018, 11:58 AM  [facility] [address] [place], [place], [address] Phone: [phone]   Fax:  [phone]  Name: Fred [name] MRN: [id number] Date of Birth: 06-Jan-1936

## 2018-03-01 NOTE — Therapy (Signed)
[facility] [address] [place], [place], [address] Phone: [phone]   Fax:  [phone]  Physical Therapy Treatment  Patient Details  Name: Fred [name] MRN: [id number] Date of Birth: 03/14/1936 Referring Provider (PT): Dr. Letta Pate   Encounter Date: 03/01/2018  PT End of Session - 03/01/18 1308    Visit Number  19    Number of Visits  30    Date for PT Re-Evaluation  04/13/18    Authorization Type  Medicare, UHC, Tricare (no PTAs). Progress note every 10th visit.     PT Start Time  1022    PT Stop Time  1105    PT Time Calculation (min)  43 min    Equipment Utilized During Treatment  --   mod A -S prn   Activity Tolerance  Patient tolerated treatment well    Behavior During Therapy  WFL for tasks assessed/performed       Past Medical History:  Diagnosis Date  . Atrial fibrillation ([place])    Post operative  . CAD (coronary artery disease)    s/p cabg  . CHF (congestive heart failure) ([place])   . Diverticulosis   . DJD (degenerative joint disease)   . DM (diabetes mellitus) ([place])   . History of anemia   . History of stroke   . Hyperlipidemia   . Hypertension   . 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 Peripheral vascular disease ([place])   . Polyneuropathy, diabetic ([place])   . Seizure disorder, complex partial ([place])   . Syncope and collapse     Past Surgical History:  Procedure Laterality Date  . CATARACT EXTRACTION W/ INTRAOCULAR LENS  IMPLANT, BILATERAL  06/2013   Dr. [name]  . CORONARY ARTERY BYPASS GRAFT  02/13/2004   4 vesel  . removal of sebaceous cyst from neck  08/2007   Dr. [name]  . TONSILLECTOMY      There were no vitals filed for this visit.  Subjective Assessment - 03/01/18 1025    Subjective  Walked last night with hemi walker, did very well per son in law.    Patient is accompained by:  Family member    Pertinent History  DM with polyneuropathy, hx of  cataracts, hx of CVA in 03/2017 and again 07/2017, OA in R knee, DJD, HTN, HLD, coronary atherosclerosis, a-fib, CHF, PVD, parotid lesion, diverticulitis, partial epilepsy, anemia    Patient Stated Goals  get stronger; able to walk and transfer better    Currently in Pain?  No/denies                       [facility] Adult PT Treatment/Exercise - 03/01/18 1300      Transfers   Transfers  Sit to Stand;Stand to [employer] Transfers    Sit to Stand  4: Min guard    Sit to Stand Details (indicate cue type and reason)  with RW with hand orthosis    Stand to Sit  4: Min assist    Stand to Sit Details  with RW with hand orthosis    Stand Pivot Transfers  4: Min assist;3: Mod assist    Stand Pivot Transfer Details (indicate cue type and reason)  with RW with hand orthosis, assistance to advance and place LLE      Ambulation/Gait   Ambulation/Gait  Yes    Ambulation/Gait Assistance  4: Min assist;3: Mod assist    Ambulation/Gait Assistance  Details  Gait with RW with hand orthosis with PT providing verbal and tactile cues for full weight shift to RLE, upright trunk in order to advance LLE.  Pt demonstrating improved initiation of L swing but continues to require assistance of PT to place LLE before advancing COG over LLE    Ambulation Distance (Feet)  50 Feet    Assistive device  Rolling walker    Gait Pattern  Step-through pattern;Decreased step length - right;Decreased stance time - left;Decreased stride length;Decreased hip/knee flexion - left;Decreased dorsiflexion - left;Decreased weight shift to right;Lateral trunk lean to left;Narrow base of support;Poor foot clearance - left    Ambulation Surface  Level;Indoor      Neuro Re-ed    Neuro Re-ed Details   With OT assistance - performed stepping without use of AD to allow pt to self monitor and self correct balance.  OT on L side, PT on R side having pt place RUE around therapist to minimize pushing through RUE.  Increased time spent  having pt attend to direction of LOB (posterior and L) and initiating weight shift forwards and to the R during R stance.  OT assisted with placement of LLE and support of LLE during L stance and advancement of RLE.  Only able to take 4-5 steps forwards with +2 therapist assistance      Knee/Hip Exercises: Aerobic   Nustep  Level 4 resistance with bilat UE and LE (RUE in hand orthosis to maintain grip but therapist providing support at wrist, elbow and scapula) x 6 minutes, 2 minutes with LE only at level 4 resistance               PT Short Term Goals - 02/06/18 1305      PT SHORT TERM GOAL #1   Title  Pt will perform standing and strengthening HEP with supervision of family    Time  4    Period  Weeks    Status  Revised    Target Date  03/14/18      PT SHORT TERM GOAL #2   Title  Pt will perform stand pivot transfers with AFO, shoe lift and RW with min A to L and R    Time  4    Period  Weeks    Status  Revised    Target Date  03/14/18      PT SHORT TERM GOAL #3   Title  Pt will improve BERG balance test by 8 points     Baseline  9/56    Time  4    Period  Weeks    Status  Revised    Target Date  03/14/18      PT SHORT TERM GOAL #4   Title  Pt will ambulate x 115' with RW, AFO and shoe lift with consistent Min A    Baseline  40' with RW and mod A, AFO    Time  4    Period  Weeks    Status  Revised    Target Date  03/14/18      PT SHORT TERM GOAL #5   Title  Pt will demonstrate safe power w/c mobility over indoor and outdoor surfaces >500' MOD I and negotiate ramped surfaces and around obstacles safely    Time  4    Period  Weeks    Status  New    Target Date  03/14/18        PT Long Term Goals - 02/06/18  [place] #1   Title  Pt will demonstrate ability to perform final HEP and walking program with supervision of family    Time  8    Period  Weeks    Status  New    Target Date  04/13/18      PT LONG TERM GOAL #2   Title  Pt will  ambulate x 200' over indoor surfaces with LRAD, AFO and shoe lift with supervision    Time  8    Period  Weeks    Status  Revised    Target Date  04/13/18      PT LONG TERM GOAL #3   Title  Pt will perform stand pivot transfers with LRAD, AFO and shoe lift with supervision to L and R    Time  8    Period  Weeks    Status  New    Target Date  04/13/18      PT LONG TERM GOAL #4   Title  Pt will improve BERG score to >/=20/56 to decr. falls risk.     Time  8    Period  Weeks    Status  New    Target Date  04/13/17            Plan - 03/01/18 1605    Clinical Impression Statement  Pt demonstrated improvements in use of LLE and symmetrical sit > stand with decreased assistance from therapist today.  Continued to focus on use of Nustep for reciprocal movement, endurance, UE/LE ROM and strengthening with increased resistance today.  Continued gait training with RW with hand orthosis with pt demonstrating improved initiation of L swing phase but continued to be limited in R lateral weight shift due to pushing through RUE.  Performed NMR with OT for standing balance, weight shifting, motor planning and stepping training without use of AD.  Will continue to address in order to progress towards LTG.    Rehab Potential  Good    Clinical Impairments Affecting Rehab Potential  left side weakness; poor left side motor control    PT Frequency  2x / week    PT Duration  8 weeks    PT Treatment/Interventions  ADLs/Self Care Home Management;Biofeedback;Canalith Repostioning;Electrical Stimulation;Therapeutic activities;Therapeutic exercise;Manual techniques;Vestibular;Wheelchair mobility training;Functional mobility training;Orthotic Fit/Training;Stair training;Gait training;Patient/family education;DME Instruction;Neuromuscular re-education;Balance training    PT Next Visit Plan  NMR at counter active reaching and weight shifting forwards and to the R, add in rotation to R.  For gait: focus on active  weight shift to R and activation of LLE during swing phase and sit <> stand.  cont to work on transfers (STS and stand pivot with RW) ;make sure he is getting weight shift correct;  scifit was good tool for recipriocal mvt and UE involvement; he wants to try to get all the way around the gym with (RW with L h/o)    Consulted and Agree with Plan of Care  Patient;Family member/caregiver    Family Member Consulted  daughter       Patient will benefit from skilled therapeutic intervention in order to improve the following deficits and impairments:  Abnormal gait, Decreased endurance, Decreased knowledge of use of DME, Decreased strength, Impaired UE functional use, Decreased balance, Decreased mobility, Decreased range of motion, Decreased coordination, Impaired flexibility, Postural dysfunction  Visit Diagnosis: Hemiplegia and hemiparesis following cerebral infarction affecting left non-dominant side (HCC)  Unsteadiness on feet  Other abnormalities  of gait and mobility     Problem List Patient Active Problem List   Diagnosis Date Noted  . 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 Acute blood loss anemia   . Chronic diastolic (congestive) heart failure ([place])   . Hemiparesis affecting left side as late effect of cerebrovascular accident ([place])   . Diabetes mellitus type 2 in obese ([place])   . Small vessel disease ([place]) 08/11/2017  . Left hemiparesis ([place])   . Benign essential hypertension with delivery   . CVA (cerebral vascular accident) ([place]) 08/10/2017  . 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 of localized onset, not intractable, without status epilepticus ([place]) 06/20/2017  . Primary osteoarthritis of both knees 05/11/2017  . Stroke (cerebrum) ([place]) 03/21/2017  . Type 2 diabetes mellitus with complication, with long-term current use of insulin ([place]) 03/27/2015  . Atherosclerosis of native coronary artery of  native heart without angina pectoris 03/27/2015  . Palpitations 11/04/2014  . S/P CABG (coronary artery bypass graft) 11/04/2014  . Atrial fibrillation, unspecified   . History of stroke 09/04/2014  . Obesity (BMI 30-39.9) 11/29/2013  . History of gout 05/16/2013  . Congestive heart failure ([place]) 04/20/2009  . DYSPNEA 02/11/2009  . CARDIOVASCULAR FUNCTION STUDY, ABNORMAL 01/29/2009  . NECK PAIN 08/03/2007  . ANEMIA / OTHER 05/16/2007  . Coronary atherosclerosis 05/16/2007  . PAROXYSMAL ATRIAL FIBRILLATION 05/16/2007  . Subcortical infarction ([place]) 05/16/2007  . Hyperlipidemia 03/30/2007  . DEGENERATIVE JOINT DISEASE 03/30/2007  . PAROTID LESION, UNSPECIFIED 09/29/2006  . Partial epilepsy with impairment of consciousness ([place]) 09/29/2006  . Diabetic polyneuropathy ([place]) 09/29/2006  . Essential hypertension 09/29/2006  . PERIPHERAL VASCULAR DISEASE 09/29/2006  . DIVERTICULOSIS 09/29/2006   Rico Junker, PT, DPT 03/01/18    4:10 PM    [facility] [address] [place], [place], [address] Phone: [phone]   Fax:  [phone]  Name: Fred [name] MRN: [id number] Date of Birth: [birth year]

## 2018-03-01 NOTE — Patient Instructions (Signed)
Access Code: [email or web address]  URL: [email or web address]  Date: 03/01/2018  Prepared by: Forde Radon   Exercises  Supine Shoulder Flexion PROM to 90 Degrees - 10 reps - 1 sets - seconds hold - 2x daily - 7x weekly  Supine Shoulder Flexion AAROM with Hands Clasped - 10 reps - 1 sets - seconds hold - 2x daily - 7x weekly

## 2018-03-02 ENCOUNTER — Ambulatory Visit (INDEPENDENT_AMBULATORY_CARE_PROVIDER_SITE_OTHER): Payer: Medicare Other | Admitting: Family Medicine

## 2018-03-02 ENCOUNTER — Encounter: Payer: Self-pay | Admitting: Family Medicine

## 2018-03-02 VITALS — BP 134/58 | HR 60 | Ht 63.0 in | Wt 163.0 lb

## 2018-03-02 DIAGNOSIS — Z794 Long term (current) use of insulin: Secondary | ICD-10-CM | POA: Diagnosis not present

## 2018-03-02 DIAGNOSIS — E118 Type 2 diabetes mellitus with unspecified complications: Secondary | ICD-10-CM

## 2018-03-02 DIAGNOSIS — I639 Cerebral infarction, unspecified: Secondary | ICD-10-CM | POA: Diagnosis not present

## 2018-03-02 DIAGNOSIS — Z8673 Personal history of transient ischemic attack (TIA), and cerebral infarction without residual deficits: Secondary | ICD-10-CM | POA: Diagnosis not present

## 2018-03-02 LAB — POCT GLYCOSYLATED HEMOGLOBIN (HGB A1C): Hemoglobin A1C: 7.4 % — AB (ref 4.0–5.6)

## 2018-03-02 NOTE — Patient Instructions (Signed)
Increase Lantus 12 units daily

## 2018-03-02 NOTE — Progress Notes (Signed)
Subjective:    CC: DM   HPI:  Diabetes - no hypoglycemic events. No wounds or sores that are not healing well. No increased thirst or urination. Checking glucose at home. Taking medications as prescribed without any side effects. Taking cookies.   Hx of stroke with hemiparesis on the left side.  Will going to physical therapy and has sessions into January.  He is still not able to use that left arm he is barely able to move his fingers in his left hand.  He also has significant weakness in that left leg and hip.  Is gotten some chronic swelling in that left ankle and just a trace amount of swelling in the left hand.  He is now using electric wheelchair.  He wants to know when he can drive.  Past medical history, Surgical history, Family history not pertinant except as noted below, Social history, Allergies, and medications have been entered into the medical record, reviewed, and corrections made.   Review of Systems: No fevers, chills, night sweats, weight loss, chest pain, or shortness of breath.   Objective:    General: Well Developed, well nourished, and in no acute distress.  Neuro: Alert and oriented x3, extra-ocular muscles intact, sensation grossly intact.  HEENT: Normocephalic, atraumatic  Skin: Warm and dry, no rashes. Cardiac: Regular rate and rhythm, no murmurs rubs or gallops, no lower extremity edema.  Respiratory: Clear to auscultation bilaterally. Not using accessory muscles, speaking in full sentences.   Impression and Recommendations:    DM - Uncontrolled.  A1C up today at 7.4.  Discussed cutting back on cookies and increase Lantus to 12 units.   He says he has had a couple of low blood sugars run doing be really careful about how much we push his Lantus.  I would suspect the increase in his A1c is probably from being much less mobile since his stroke.  Hx of stroke with hemoparesis on the left side.  10 you with physical therapy.  If he is not able to use that left arm  and left foot then he may have to have a special driving evaluation for him to be able to continue to drive.

## 2018-03-08 ENCOUNTER — Ambulatory Visit: Payer: Medicare Other | Admitting: Rehabilitation

## 2018-03-08 ENCOUNTER — Encounter: Payer: Self-pay | Admitting: Rehabilitation

## 2018-03-08 DIAGNOSIS — M6281 Muscle weakness (generalized): Secondary | ICD-10-CM | POA: Diagnosis not present

## 2018-03-08 DIAGNOSIS — R2689 Other abnormalities of gait and mobility: Secondary | ICD-10-CM | POA: Diagnosis not present

## 2018-03-08 DIAGNOSIS — R293 Abnormal posture: Secondary | ICD-10-CM | POA: Diagnosis not present

## 2018-03-08 DIAGNOSIS — I69354 Hemiplegia and hemiparesis following cerebral infarction affecting left non-dominant side: Secondary | ICD-10-CM | POA: Diagnosis not present

## 2018-03-08 DIAGNOSIS — R2681 Unsteadiness on feet: Secondary | ICD-10-CM

## 2018-03-08 DIAGNOSIS — M25512 Pain in left shoulder: Secondary | ICD-10-CM | POA: Diagnosis not present

## 2018-03-08 NOTE — Therapy (Addendum)
[facility] [address] [place], [place], [address] Phone: [phone]   Fax:  [phone]  Physical Therapy Treatment and Progress Note  Patient Details  Name: Fred [name] MRN: [id number] Date of Birth: [birth year] Referring Provider (PT): Dr. Letta Pate   Encounter Date: 03/08/2018  PT End of Session - 03/08/18 1245    Visit Number  20    Number of Visits  30    Date for PT Re-Evaluation  04/13/18    Authorization Type  Medicare, UHC, Tricare (no PTAs). Progress note every 10th visit.     PT Start Time  1105    PT Stop Time  1150    PT Time Calculation (min)  45 min    Equipment Utilized During Treatment  --   mod A -S prn   Activity Tolerance  Patient tolerated treatment well    Behavior During Therapy  WFL for tasks assessed/performed       Past Medical History:  Diagnosis Date  . Atrial fibrillation ([place])    Post operative  . CAD (coronary artery disease)    s/p cabg  . CHF (congestive heart failure) ([place])   . Diverticulosis   . DJD (degenerative joint disease)   . DM (diabetes mellitus) ([place])   . History of anemia   . History of stroke   . Hyperlipidemia   . Hypertension   . 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 Peripheral vascular disease ([place])   . Polyneuropathy, diabetic ([place])   . Seizure disorder, complex partial ([place])   . Syncope and collapse     Past Surgical History:  Procedure Laterality Date  . CATARACT EXTRACTION W/ INTRAOCULAR LENS  IMPLANT, BILATERAL  06/2013   Dr. [name]  . CORONARY ARTERY BYPASS GRAFT  02/13/2004   4 vesel  . removal of sebaceous cyst from neck  08/2007   Dr. [name]  . TONSILLECTOMY      There were no vitals filed for this visit.  Subjective Assessment - 03/08/18 1243    Subjective  Pt/family reports he did 4 laps in kitchen yesterday with hemi walker.     Patient is accompained by:  Family member    Pertinent History  DM with  polyneuropathy, hx of cataracts, hx of CVA in 03/2017 and again 07/2017, OA in R knee, DJD, HTN, HLD, coronary atherosclerosis, a-fib, CHF, PVD, parotid lesion, diverticulitis, partial epilepsy, anemia    Patient Stated Goals  get stronger; able to walk and transfer better    Currently in Pain?  No/denies               Gait/Self Care:  PT ambulated with pt with hemi walker (L AFO) and R shoe lift so that granddaughter could observe how to assist and cues to provide for safe ambulation at home.  Pt ambulated x 45' at min/guard to min A level with great clearance of LLE today (used shoe cover for improved clearance) with light tactile cues for posture and L lateral weight shift during L stance phase of gait.  Granddaughter reports they are ambulating at home with him in this manner and she feels good with it (she did show me video of him walking at home with hemi walker).  PT then got RW with L hand orthosis and ambulated another 20' with min A.  Note that pt needs more assist to clear LLE however did better overall with balance and postural control.  PT did  provide cues for decreased trunk rotation and forward/lateral movement when stepping/advancing over stance limb.  PT then had granddaughter assist with gait in this manner.  Provided cues for how to assist, esp with LLE and how to cue for improved R lateral weight shift.  Provided visual cue to "aim hip for red tap on walker."  Granddaughter did very well however did recommend only her or grandson assist in this manner.    PT did provide granddaughter with card for [employer] to call and make an appt to get toe cap extended and note that granddaughter ordered RW during session on [employer].                  PT Education - 03/08/18 1243    Education Details  Education for only granddaughter/grandson to assist with gait with RW and hand orthosis at home.     Person(s) Educated  Patient;Child(ren)    Methods  Explanation;Demonstration     Comprehension  Verbalized understanding;Returned demonstration       PT Short Term Goals - 02/06/18 1305      PT SHORT TERM GOAL #1   Title  Pt will perform standing and strengthening HEP with supervision of family    Time  4    Period  Weeks    Status  Revised    Target Date  03/14/18      PT SHORT TERM GOAL #2   Title  Pt will perform stand pivot transfers with AFO, shoe lift and RW with min A to L and R    Time  4    Period  Weeks    Status  Revised    Target Date  03/14/18      PT SHORT TERM GOAL #3   Title  Pt will improve BERG balance test by 8 points     Baseline  9/56    Time  4    Period  Weeks    Status  Revised    Target Date  03/14/18      PT SHORT TERM GOAL #4   Title  Pt will ambulate x 115' with RW, AFO and shoe lift with consistent Min A    Baseline  40' with RW and mod A, AFO    Time  4    Period  Weeks    Status  Revised    Target Date  03/14/18      PT SHORT TERM GOAL #5   Title  Pt will demonstrate safe power w/c mobility over indoor and outdoor surfaces >500' MOD I and negotiate ramped surfaces and around obstacles safely    Time  4    Period  Weeks    Status  New    Target Date  03/14/18        PT Long Term Goals - 02/06/18 1321      PT LONG TERM GOAL #1   Title  Pt will demonstrate ability to perform final HEP and walking program with supervision of family    Time  8    Period  Weeks    Status  New    Target Date  04/13/18      PT LONG TERM GOAL #2   Title  Pt will ambulate x 200' over indoor surfaces with LRAD, AFO and shoe lift with supervision    Time  8    Period  Weeks    Status  Revised    Target Date  04/13/18  PT LONG TERM GOAL #3   Title  Pt will perform stand pivot transfers with LRAD, AFO and shoe lift with supervision to L and R    Time  8    Period  Weeks    Status  New    Target Date  04/13/18      PT LONG TERM GOAL #4   Title  Pt will improve BERG score to >/=20/56 to decr. falls risk.     Time  8     Period  Weeks    Status  New    Target Date  04/13/17         Progress Note Reporting Period 02/03/18 to 03/08/18  See note below for Objective Data and Assessment of Progress/Goals.         Plan - 03/08/18 1245    Clinical Impression Statement  Skilled session focused on gait initially with hemi walker as pts granddaughter was present therefore wanted to provide hands on education for her.  He did VERY well today with hemi walker and only needed very light assist to place L foot but could clear independently (added shoe cover for increased clearance).  Also ambulated with RW and L hand orthosis.  Pt demos marked improvement with this as well therefore granddaughter ordered RW during session and PT provided with hand orthosis and had her ambulate with him during session.  Educated that only granddaughter/grandson are to assist pt with walking with RW at this time.  Both verbalized understanding.     Rehab Potential  Good    Clinical Impairments Affecting Rehab Potential  left side weakness; poor left side motor control    PT Frequency  2x / week    PT Duration  8 weeks    PT Treatment/Interventions  ADLs/Self Care Home Management;Biofeedback;Canalith Repostioning;Electrical Stimulation;Therapeutic activities;Therapeutic exercise;Manual techniques;Vestibular;Wheelchair mobility training;Functional mobility training;Orthotic Fit/Training;Stair training;Gait training;Patient/family education;DME Instruction;Neuromuscular re-education;Balance training    PT Next Visit Plan  Letta Moynahan they got a RW so I gave them hand orthosis-Karen probably needs to charge for this? but I only wanted the grandkids to assist with this) NMR at counter active reaching and weight shifting forwards and to the R, add in rotation to R.  For gait: focus on active weight shift to R and activation of LLE during swing phase and sit <> stand.  cont to work on transfers (STS and stand pivot with RW) ;make sure he is getting  weight shift correct;  scifit was good tool for recipriocal mvt and UE involvement; he wants to try to get all the way around the gym with (RW with L h/o)       Patient will benefit from skilled therapeutic intervention in order to improve the following deficits and impairments:  Abnormal gait, Decreased endurance, Decreased knowledge of use of DME, Decreased strength, Impaired UE functional use, Decreased balance, Decreased mobility, Decreased range of motion, Decreased coordination, Impaired flexibility, Postural dysfunction  Visit Diagnosis: Unsteadiness on feet  Muscle weakness (generalized)  Spastic hemiplegia of left nondominant side as late effect of cerebral infarction ([place])  Other abnormalities of gait and mobility     Problem List Patient Active Problem List   Diagnosis Date Noted  . 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 Acute blood loss anemia   . Chronic diastolic (congestive) heart failure ([place])   . Hemiparesis affecting left side as late effect of cerebrovascular accident ([place])   . Diabetes mellitus type 2 in obese ([place])   .  Small vessel disease ([place]) 08/11/2017  . Left hemiparesis ([place])   . Benign essential hypertension with delivery   . CVA (cerebral vascular accident) ([place]) 08/10/2017  . 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 of localized onset, not intractable, without status epilepticus ([place]) 06/20/2017  . Primary osteoarthritis of both knees 05/11/2017  . Stroke (cerebrum) ([place]) 03/21/2017  . Type 2 diabetes mellitus with complication, with long-term current use of insulin ([place]) 03/27/2015  . Atherosclerosis of native coronary artery of native heart without angina pectoris 03/27/2015  . Palpitations 11/04/2014  . S/P CABG (coronary artery bypass graft) 11/04/2014  . Atrial fibrillation, unspecified   . History of stroke 09/04/2014  . Obesity (BMI 30-39.9) 11/29/2013  . History  of gout 05/16/2013  . Congestive heart failure ([place]) 04/20/2009  . DYSPNEA 02/11/2009  . CARDIOVASCULAR FUNCTION STUDY, ABNORMAL 01/29/2009  . NECK PAIN 08/03/2007  . ANEMIA / OTHER 05/16/2007  . Coronary atherosclerosis 05/16/2007  . PAROXYSMAL ATRIAL FIBRILLATION 05/16/2007  . Subcortical infarction ([place]) 05/16/2007  . Hyperlipidemia 03/30/2007  . DEGENERATIVE JOINT DISEASE 03/30/2007  . PAROTID LESION, UNSPECIFIED 09/29/2006  . Partial epilepsy with impairment of consciousness ([place]) 09/29/2006  . Diabetic polyneuropathy ([place]) 09/29/2006  . Essential hypertension 09/29/2006  . PERIPHERAL VASCULAR DISEASE 09/29/2006  . DIVERTICULOSIS 09/29/2006    Cameron Sprang, PT, MPT [facility] [address] [place], [place], [address] Phone: [phone]   Fax:  [phone] 03/08/18, 1:01 PM  Name: Fred [name] MRN: [id number] Date of Birth: [birth year]

## 2018-03-13 ENCOUNTER — Encounter: Payer: Self-pay | Admitting: Rehabilitation

## 2018-03-13 ENCOUNTER — Encounter: Payer: Medicare Other | Admitting: Occupational Therapy

## 2018-03-13 ENCOUNTER — Ambulatory Visit: Payer: Medicare Other | Admitting: Rehabilitation

## 2018-03-13 ENCOUNTER — Ambulatory Visit: Payer: Medicare Other | Admitting: Occupational Therapy

## 2018-03-13 DIAGNOSIS — R2681 Unsteadiness on feet: Secondary | ICD-10-CM

## 2018-03-13 DIAGNOSIS — R293 Abnormal posture: Secondary | ICD-10-CM | POA: Diagnosis not present

## 2018-03-13 DIAGNOSIS — M6281 Muscle weakness (generalized): Secondary | ICD-10-CM

## 2018-03-13 DIAGNOSIS — I69354 Hemiplegia and hemiparesis following cerebral infarction affecting left non-dominant side: Secondary | ICD-10-CM

## 2018-03-13 DIAGNOSIS — M25612 Stiffness of left shoulder, not elsewhere classified: Secondary | ICD-10-CM

## 2018-03-13 DIAGNOSIS — R2689 Other abnormalities of gait and mobility: Secondary | ICD-10-CM | POA: Diagnosis not present

## 2018-03-13 DIAGNOSIS — M25512 Pain in left shoulder: Secondary | ICD-10-CM

## 2018-03-13 DIAGNOSIS — G8929 Other chronic pain: Secondary | ICD-10-CM

## 2018-03-13 DIAGNOSIS — M25632 Stiffness of left wrist, not elsewhere classified: Secondary | ICD-10-CM

## 2018-03-13 NOTE — Therapy (Signed)
[facility] [address] [place], [place], [address] Phone: [phone]   Fax:  [phone]  Physical Therapy Treatment  Patient Details  Name: Fred [name] MRN: [id number] Date of Birth: 07/14/1935 Referring Provider (PT): Dr. Letta Pate   Encounter Date: 03/13/2018  PT End of Session - 03/13/18 2030    Visit Number  21    Number of Visits  30    Date for PT Re-Evaluation  04/13/18    Authorization Type  Medicare, UHC, Tricare (no PTAs). Progress note every 10th visit.     PT Start Time  1149    PT Stop Time  1232    PT Time Calculation (min)  43 min    Equipment Utilized During Treatment  --   mod A -S prn   Activity Tolerance  Patient tolerated treatment well    Behavior During Therapy  WFL for tasks assessed/performed       Past Medical History:  Diagnosis Date  . Atrial fibrillation ([place])    Post operative  . CAD (coronary artery disease)    s/p cabg  . CHF (congestive heart failure) ([place])   . Diverticulosis   . DJD (degenerative joint disease)   . DM (diabetes mellitus) ([place])   . History of anemia   . History of stroke   . Hyperlipidemia   . Hypertension   . 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 Peripheral vascular disease ([place])   . Polyneuropathy, diabetic ([place])   . Seizure disorder, complex partial ([place])   . Syncope and collapse     Past Surgical History:  Procedure Laterality Date  . CATARACT EXTRACTION W/ INTRAOCULAR LENS  IMPLANT, BILATERAL  06/2013   Dr. [name]  . CORONARY ARTERY BYPASS GRAFT  02/13/2004   4 vesel  . removal of sebaceous cyst from neck  08/2007   Dr. [name]  . TONSILLECTOMY      There were no vitals filed for this visit.  Subjective Assessment - 03/13/18 2029    Subjective  Pt present with older daughter (and other family) wanting to see him walk with walker.     Patient is accompained by:  Family member    Pertinent History  DM with  polyneuropathy, hx of cataracts, hx of CVA in 03/2017 and again 07/2017, OA in R knee, DJD, HTN, HLD, coronary atherosclerosis, a-fib, CHF, PVD, parotid lesion, diverticulitis, partial epilepsy, anemia    Patient Stated Goals  get stronger; able to walk and transfer better    Currently in Pain?  No/denies   Does have R knee pain with gait            Gait and Self Care:  Older daughter present during session and wanting to see him ambulate with RW.  Pt ambulated with PT with RW and L hand orthosis x 25' at min A level.  PT trying to decrease verbal cues to pt in order to have pt initiate weight shifts and corrections more independently.  Provided cues to daughter on how PT was providing assist, more to feel for weight shift than "helping" him as he tends to increase pushing when given too much tactile assist.  Very min cues to pt to maintain R lateral weight shift throughout entire L swing phase of gait as he tends to to better with initiating R weight shift, but doesn't maintain, making it difficult to assist LLE with forward advancement.  Continue to place shoe cover over  front half of shoe to decrease friction.  Continue to provide education to pt and family that they will need to call [employer] and set up appt to extend toe cap.  PT then had pt and daughter ambulate with RW.  Pt initially did very well for approx 8-10' but then had significant LOB to the L, needing PT to assist to stabilize.  Educated that due to pts significant perceptual deficits, he will tend to push harder to the L if given too much physical assist.  Pt did do well providing feedback during gait, but they will need more practice in future sessions.  Also demonstrated standing at sink using RUE to "wipe" counter top, shifting to the R, increasing activation in LLE to assist with weight shift.  Had pt return demo with cues for increased LLE activation and more sustained R lateral weight shift.  Also had pt reaching into cabinet with RUE  for increased right lateral weight shift and R hip protraction.  Again, cues for improved posture and LLE activation.  Daughter continued to ask about donning L knee immobilizer during gait.  PT educated them on why they should NOT use knee immobilizer any more.  Pt and family verbalized understanding.                     PT Education - 03/13/18 2030    Education Details  For older daughter (and pts son in law) to only use hemi walker at this time.     Person(s) Educated  Patient;Child(ren)    Methods  Explanation    Comprehension  Verbalized understanding       PT Short Term Goals - 02/06/18 1305      PT SHORT TERM GOAL #1   Title  Pt will perform standing and strengthening HEP with supervision of family    Time  4    Period  Weeks    Status  Revised    Target Date  03/14/18      PT SHORT TERM GOAL #2   Title  Pt will perform stand pivot transfers with AFO, shoe lift and RW with min A to L and R    Time  4    Period  Weeks    Status  Revised    Target Date  03/14/18      PT SHORT TERM GOAL #3   Title  Pt will improve BERG balance test by 8 points     Baseline  9/56    Time  4    Period  Weeks    Status  Revised    Target Date  03/14/18      PT SHORT TERM GOAL #4   Title  Pt will ambulate x 115' with RW, AFO and shoe lift with consistent Min A    Baseline  40' with RW and mod A, AFO    Time  4    Period  Weeks    Status  Revised    Target Date  03/14/18      PT SHORT TERM GOAL #5   Title  Pt will demonstrate safe power w/c mobility over indoor and outdoor surfaces >500' MOD I and negotiate ramped surfaces and around obstacles safely    Time  4    Period  Weeks    Status  New    Target Date  03/14/18        PT Long Term Goals - 02/06/18 1321  PT LONG TERM GOAL #1   Title  Pt will demonstrate ability to perform final HEP and walking program with supervision of family    Time  8    Period  Weeks    Status  New    Target Date  04/13/18       PT LONG TERM GOAL #2   Title  Pt will ambulate x 200' over indoor surfaces with LRAD, AFO and shoe lift with supervision    Time  8    Period  Weeks    Status  Revised    Target Date  04/13/18      PT LONG TERM GOAL #3   Title  Pt will perform stand pivot transfers with LRAD, AFO and shoe lift with supervision to L and R    Time  8    Period  Weeks    Status  New    Target Date  04/13/18      PT LONG TERM GOAL #4   Title  Pt will improve BERG score to >/=20/56 to decr. falls risk.     Time  8    Period  Weeks    Status  New    Target Date  04/13/17            Plan - 03/13/18 2030    Clinical Impression Statement  Skilled session focused on gait with use of RW and L hand orthosis.  Pt reports he did get RW, but has not practiced with grandkids at home yet.  Older daughter present during session wanting to see how he has progressed with RW.  Had her ambulate with him with RW following PT and pt had significant LOB, therefore recommend he only use RW with two grandchildren.  Pt and family verbalized understanding.     Rehab Potential  Good    Clinical Impairments Affecting Rehab Potential  left side weakness; poor left side motor control    PT Frequency  2x / week    PT Duration  8 weeks    PT Treatment/Interventions  ADLs/Self Care Home Management;Biofeedback;Canalith Repostioning;Electrical Stimulation;Therapeutic activities;Therapeutic exercise;Manual techniques;Vestibular;Wheelchair mobility training;Functional mobility training;Orthotic Fit/Training;Stair training;Gait training;Patient/family education;DME Instruction;Neuromuscular re-education;Balance training    PT Next Visit Plan  Letta Moynahan they got a RW so I gave them hand orthosis but I only wanted the grandkids to assist with this) NMR at counter active reaching and weight shifting forwards and to the R, add in rotation to R.  For gait: focus on active weight shift to R and activation of LLE during swing phase and sit <>  stand.  cont to work on transfers (STS and stand pivot with RW) ;make sure he is getting weight shift correct;  scifit was good tool for recipriocal mvt and UE involvement; he wants to try to get all the way around the gym with (RW with L h/o)    Recommended Other Services  Raquel Sarna has educated many family members and provided phone number to call [employer] and have toe cap extended        Patient will benefit from skilled therapeutic intervention in order to improve the following deficits and impairments:  Abnormal gait, Decreased endurance, Decreased knowledge of use of DME, Decreased strength, Impaired UE functional use, Decreased balance, Decreased mobility, Decreased range of motion, Decreased coordination, Impaired flexibility, Postural dysfunction  Visit Diagnosis: Unsteadiness on feet  Muscle weakness (generalized)  Other abnormalities of gait and mobility  Hemiplegia and hemiparesis following cerebral infarction affecting left non-dominant  side [facility])     Problem List Patient Active Problem List   Diagnosis Date Noted  . 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 Acute blood loss anemia   . Chronic diastolic (congestive) heart failure ([place])   . Hemiparesis affecting left side as late effect of cerebrovascular accident ([employer])   . Diabetes mellitus type 2 in obese ([place])   . Small vessel disease ([place]) 08/11/2017  . Left hemiparesis ([place])   . Benign essential hypertension with delivery   . CVA (cerebral vascular accident) ([place]) 08/10/2017  . 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 of localized onset, not intractable, without status epilepticus ([place]) 06/20/2017  . Primary osteoarthritis of both knees 05/11/2017  . Stroke (cerebrum) ([place]) 03/21/2017  . Type 2 diabetes mellitus with complication, with long-term current use of insulin ([place]) 03/27/2015  . Atherosclerosis of native coronary artery of native heart  without angina pectoris 03/27/2015  . Palpitations 11/04/2014  . S/P CABG (coronary artery bypass graft) 11/04/2014  . Atrial fibrillation, unspecified   . History of stroke 09/04/2014  . Obesity (BMI 30-39.9) 11/29/2013  . History of gout 05/16/2013  . Congestive heart failure ([place]) 04/20/2009  . DYSPNEA 02/11/2009  . CARDIOVASCULAR FUNCTION STUDY, ABNORMAL 01/29/2009  . NECK PAIN 08/03/2007  . ANEMIA / OTHER 05/16/2007  . Coronary atherosclerosis 05/16/2007  . PAROXYSMAL ATRIAL FIBRILLATION 05/16/2007  . Subcortical infarction ([place]) 05/16/2007  . Hyperlipidemia 03/30/2007  . DEGENERATIVE JOINT DISEASE 03/30/2007  . PAROTID LESION, UNSPECIFIED 09/29/2006  . Partial epilepsy with impairment of consciousness ([place]) 09/29/2006  . Diabetic polyneuropathy ([place]) 09/29/2006  . Essential hypertension 09/29/2006  . PERIPHERAL VASCULAR DISEASE 09/29/2006  . DIVERTICULOSIS 09/29/2006    Cameron Sprang, PT, MPT [facility] [address] [place], [place], [address] Phone: [phone]   Fax:  [phone] 03/13/18, 8:41 PM  Name: Fred [name] MRN: [id number] Date of Birth: 07-Oct-1935

## 2018-03-13 NOTE — Therapy (Signed)
[facility] [address] [place], [place], [address] Phone: [phone]   Fax:  [phone]  Occupational Therapy Treatment  Patient Details  Name: Fred [name] MRN: [id number] Date of Birth: 1935/04/13 Referring Provider (OT): Dr. Alysia Penna   Encounter Date: 03/13/2018  OT End of Session - 03/13/18 1418    Visit Number  21    Number of Visits  57   pt renewed last visit for 12 weeks   Date for OT Re-Evaluation  05/24/18    Authorization Type  Medicare, Tricare, UHC - pt will need PN every 10th visit    Authorization Time Period  90 days    Authorization - Visit Number  21    Authorization - Number of Visits  30    OT Start Time  1106    OT Stop Time  1145    OT Time Calculation (min)  39 min    Activity Tolerance  Patient tolerated treatment well    Behavior During Therapy  [facility] for tasks assessed/performed       Past Medical History:  Diagnosis Date  . Atrial fibrillation ([place])    Post operative  . CAD (coronary artery disease)    s/p cabg  . CHF (congestive heart failure) ([place])   . Diverticulosis   . DJD (degenerative joint disease)   . DM (diabetes mellitus) ([place])   . History of anemia   . History of stroke   . Hyperlipidemia   . Hypertension   . 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 Peripheral vascular disease ([place])   . Polyneuropathy, diabetic ([place])   . Seizure disorder, complex partial ([place])   . Syncope and collapse     Past Surgical History:  Procedure Laterality Date  . CATARACT EXTRACTION W/ INTRAOCULAR LENS  IMPLANT, BILATERAL  06/2013   Dr. [name]  . CORONARY ARTERY BYPASS GRAFT  02/13/2004   4 vesel  . removal of sebaceous cyst from neck  08/2007   Dr. [name]  . TONSILLECTOMY      There were no vitals filed for this visit.  Subjective Assessment - 03/13/18 1418    Pertinent History  [name] 08/09/2017. DIscharged home on 09/22/2017 after inpt rehab  stay. Pt had HH PT and OT. PMH:      Patient Stated Goals  I want to get walking again.  I am an outdoor guy    Currently in Pain?  No/denies            Treatment: Pt transferred scoot pivot from electric w/c to mat with min A. Supine gentle mobilization of left scapula while pt performed self ROM. Gentle passive stretch wrist  and finger extension, AA/ROM elbow flexion/ extension. Gentle self stretch reach for the floor, min facilitation for balance. Seated weightbearing through tilted stool with body on arm movements, mod verbal cues, facilitation.                 OT Short Term Goals - 03/01/18 1152      OT SHORT TERM GOAL #1   Title  Pt and family will be mod I with basic home exercise/home activities program for LUE, balance and functional mobility - 01/25/2018    Status  Achieved      OT SHORT TERM GOAL #2   Title  Pt will be supervision with UB bathing    Status  Achieved   pt is able to do however wife continues  to assist     OT SHORT TERM GOAL #3   Title  Pt will be min a for LB bathing    Status  Achieved      OT SHORT TERM GOAL #4   Title  Pt will be close supervision for toilet transfers    Status  Achieved      OT SHORT TERM GOAL #5   Title  Pt will be min a for tub bench transfers (assist with LLE)    Status  Achieved      OT SHORT TERM GOAL #6   Title  Pt will tolerate shoulder flexion to 110* with no pain in supine in prep for HEP and low functional reach    Status  Achieved      OT SHORT TERM GOAL #7   Title  Pt will demonstrate shoulder flexion to at least 30* in prep for low reach - 04/12/2018    Status  On-going      OT SHORT TERM GOAL #8   Title  Pt will demonstrate ability to hold light objects in L hand while manipulating with R hand (i.e opening containers) with min a    Status  On-going      OT SHORT TERM GOAL  #9   TITLE  Pt will be mod a for LB dressing    Status  Achieved      OT SHORT TERM GOAL  #10   TITLE  Pt/family  will be mod I with splint wear and care, prn    Status  Achieved        OT Long Term Goals - 03/01/18 1153      OT LONG TERM GOAL #1   Title  Pt and family will be mod I with upgraded HEP/home activities program - 05/24/2018    Status  On-going      OT LONG TERM GOAL #2   Title  Pt will be min a for LB dressing    Status  On-going      OT LONG TERM GOAL #3   Title  Pt will be mod I with toilet transfers    Status  On-going      OT LONG TERM GOAL #4   Title  Pt will be mod I with clothing manipulation with toileting activities    Status  On-going      OT LONG TERM GOAL #5   Title  Pt will be min a for simple familiar hot meal prep    Status  On-going      OT LONG TERM GOAL #6   Title  Pt will demonstate ability to use LUE as stabilzer at least 25% of the time during basic self care and fishing activities.    Status  On-going      OT LONG TERM GOAL #7   Title  Pt will be mod I with casting simple fishing pole    Status  On-going      OT LONG TERM GOAL #8   Title  Pt will demonstate adequate postural alignment and control to complete simple activities in standing without UE support.     Status  On-going            Plan - 03/13/18 1419    Clinical Impression Statement  Pt is progressing slowly towards goals.     Occupational Profile and client history currently impacting functional performance  PMH: husband, father, grand father, retiree.  Pt loves the outdoors and fishing.  PMH: CAD, S/P CABG, DM, HTN, HLD, AFIB, CHF, seizure, PVD, obesity, ETHO abuse, TIA in 03/2017.      Occupational performance deficits (Please refer to evaluation for details):  ADL's;IADL's;Leisure;Social Participation;Rest and Sleep    Rehab Potential  Good    Current Impairments/barriers affecting progress:  medical history, severity of motor deficits, limited family suppor for carryover as pt's wife has cognitive decline as well    OT Frequency  2x / week    OT Duration  12 weeks    Plan    manual therapy to address LUE shouler girdle alignment, NMR for LUE, balance, functional mobility, ADL's, aquatic therapy     Consulted and Agree with Plan of Care  Patient;Family member/caregiver    Family Member Consulted  wife, pt's dtr and son in law left to run errand       Patient will benefit from skilled therapeutic intervention in order to improve the following deficits and impairments:     Visit Diagnosis: Muscle weakness (generalized)  Spastic hemiplegia of left nondominant side as late effect of cerebral infarction (HCC)  Abnormal posture  Chronic left shoulder pain  Unsteadiness on feet  Stiffness of left wrist, not elsewhere classified  Stiffness of left shoulder, not elsewhere classified  Hemiplegia and hemiparesis following cerebral infarction affecting left non-dominant side [facility])    Problem List Patient Active Problem List   Diagnosis Date Noted  . 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 Acute blood loss anemia   . Chronic diastolic (congestive) heart failure ([place])   . Hemiparesis affecting left side as late effect of cerebrovascular accident ([place])   . Diabetes mellitus type 2 in obese ([place])   . Small vessel disease ([place]) 08/11/2017  . Left hemiparesis ([place])   . Benign essential hypertension with delivery   . CVA (cerebral vascular accident) ([place]) 08/10/2017  . 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 of localized onset, not intractable, without status epilepticus ([place]) 06/20/2017  . Primary osteoarthritis of both knees 05/11/2017  . Stroke (cerebrum) ([place]) 03/21/2017  . Type 2 diabetes mellitus with complication, with long-term current use of insulin ([place]) 03/27/2015  . Atherosclerosis of native coronary artery of native heart without angina pectoris 03/27/2015  . Palpitations 11/04/2014  . S/P CABG (coronary artery bypass graft) 11/04/2014  . Atrial fibrillation, unspecified    . History of stroke 09/04/2014  . Obesity (BMI 30-39.9) 11/29/2013  . History of gout 05/16/2013  . Congestive heart failure ([place]) 04/20/2009  . DYSPNEA 02/11/2009  . CARDIOVASCULAR FUNCTION STUDY, ABNORMAL 01/29/2009  . NECK PAIN 08/03/2007  . ANEMIA / OTHER 05/16/2007  . Coronary atherosclerosis 05/16/2007  . PAROXYSMAL ATRIAL FIBRILLATION 05/16/2007  . Subcortical infarction ([place]) 05/16/2007  . Hyperlipidemia 03/30/2007  . DEGENERATIVE JOINT DISEASE 03/30/2007  . PAROTID LESION, UNSPECIFIED 09/29/2006  . Partial epilepsy with impairment of consciousness ([place]) 09/29/2006  . Diabetic polyneuropathy ([place]) 09/29/2006  . Essential hypertension 09/29/2006  . PERIPHERAL VASCULAR DISEASE 09/29/2006  . DIVERTICULOSIS 09/29/2006    RINE,KATHRYN 03/13/2018, 2:26 PM  [facility] [address] [place], [place], [address] Phone: [phone]   Fax:  [phone]  Name: KREIG PARSON MRN: [id number] Date of Birth: 08/15/1935

## 2018-03-19 ENCOUNTER — Other Ambulatory Visit: Payer: Self-pay | Admitting: Family Medicine

## 2018-03-19 ENCOUNTER — Ambulatory Visit: Payer: Medicare Other | Admitting: Occupational Therapy

## 2018-03-19 ENCOUNTER — Encounter: Payer: Self-pay | Admitting: Physical Therapy

## 2018-03-19 ENCOUNTER — Encounter: Payer: Self-pay | Admitting: Family Medicine

## 2018-03-19 ENCOUNTER — Ambulatory Visit (INDEPENDENT_AMBULATORY_CARE_PROVIDER_SITE_OTHER): Payer: Medicare Other

## 2018-03-19 ENCOUNTER — Telehealth: Payer: Self-pay | Admitting: Physical Therapy

## 2018-03-19 ENCOUNTER — Telehealth: Payer: Self-pay

## 2018-03-19 ENCOUNTER — Ambulatory Visit: Payer: Medicare Other | Admitting: Physical Therapy

## 2018-03-19 ENCOUNTER — Ambulatory Visit (INDEPENDENT_AMBULATORY_CARE_PROVIDER_SITE_OTHER): Payer: Medicare Other | Admitting: Family Medicine

## 2018-03-19 VITALS — BP 120/70 | HR 94 | Temp 99.0°F

## 2018-03-19 VITALS — BP 96/53 | HR 69 | Temp 99.0°F

## 2018-03-19 DIAGNOSIS — R3915 Urgency of urination: Secondary | ICD-10-CM

## 2018-03-19 DIAGNOSIS — R509 Fever, unspecified: Secondary | ICD-10-CM

## 2018-03-19 DIAGNOSIS — M6281 Muscle weakness (generalized): Secondary | ICD-10-CM | POA: Insufficient documentation

## 2018-03-19 DIAGNOSIS — R05 Cough: Secondary | ICD-10-CM | POA: Diagnosis not present

## 2018-03-19 DIAGNOSIS — R059 Cough, unspecified: Secondary | ICD-10-CM

## 2018-03-19 DIAGNOSIS — J069 Acute upper respiratory infection, unspecified: Secondary | ICD-10-CM | POA: Diagnosis not present

## 2018-03-19 DIAGNOSIS — R350 Frequency of micturition: Secondary | ICD-10-CM

## 2018-03-19 DIAGNOSIS — J984 Other disorders of lung: Secondary | ICD-10-CM | POA: Diagnosis not present

## 2018-03-19 LAB — POCT INFLUENZA A/B
INFLUENZA A, POC: NEGATIVE
Influenza B, POC: NEGATIVE

## 2018-03-19 NOTE — Therapy (Signed)
[facility] [address] [place], [place], [address] Phone: [phone]   Fax:  [phone]  Physical Therapy Encounter - [place], No Charge  Patient Details  Name: Fred [name] MRN: [id number] Date of Birth: 1935-11-24 Referring Provider (PT): Dr. Letta Pate   Encounter Date: 03/19/2018    Past Medical History:  Diagnosis Date  . Atrial fibrillation ([place])    Post operative  . CAD (coronary artery disease)    s/p cabg  . CHF (congestive heart failure) ([place])   . Diverticulosis   . DJD (degenerative joint disease)   . DM (diabetes mellitus) ([place])   . History of anemia   . History of stroke   . Hyperlipidemia   . Hypertension   . 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 Peripheral vascular disease ([place])   . Polyneuropathy, diabetic ([place])   . Seizure disorder, complex partial ([place])   . Syncope and collapse     Past Surgical History:  Procedure Laterality Date  . CATARACT EXTRACTION W/ INTRAOCULAR LENS  IMPLANT, BILATERAL  06/2013   Dr. [name]  . CORONARY ARTERY BYPASS GRAFT  02/13/2004   4 vesel  . removal of sebaceous cyst from neck  08/2007   Dr. [name]  . TONSILLECTOMY      Vitals:   03/19/18 0833  BP: 120/70  Pulse: 94  Temp: 99 F (37.2 C)  TempSrc: Oral  SpO2: 94%    Subjective Assessment - 03/19/18 0811    Subjective  Pt slid out of bed this morning; reports he had on socks without grip and was trying to stand on tile floor.  Son in law had to help him get up.  No injury.  Pt reports power w/c has malfunctioned, using manual w/c this morning.  Walked with hemi walker ~50' at home.    Patient is accompained by:  Family member    Pertinent History  DM with polyneuropathy, hx of cataracts, hx of CVA in 03/2017 and again 07/2017, OA in R knee, DJD, HTN, HLD, coronary atherosclerosis, a-fib, CHF, PVD, parotid lesion, diverticulitis, partial epilepsy, anemia    Patient Stated  Goals  get stronger; able to walk and transfer better    Currently in Pain?  No/denies                               PT Education - 03/19/18 0834    Education Details  Cancel therapy for today; pt to follow up with PCP    Person(s) Educated  Patient;Child(ren)    Methods  Explanation    Comprehension  Verbalized understanding       PT Short Term Goals - 02/06/18 1305      PT SHORT TERM GOAL #1   Title  Pt will perform standing and strengthening HEP with supervision of family    Time  4    Period  Weeks    Status  Revised    Target Date  03/14/18      PT SHORT TERM GOAL #2   Title  Pt will perform stand pivot transfers with AFO, shoe lift and RW with min A to L and R    Time  4    Period  Weeks    Status  Revised    Target Date  03/14/18      PT SHORT TERM GOAL #3   Title  Pt will improve BERG  balance test by 8 points     Baseline  9/56    Time  4    Period  Weeks    Status  Revised    Target Date  03/14/18      PT SHORT TERM GOAL #4   Title  Pt will ambulate x 115' with RW, AFO and shoe lift with consistent Min A    Baseline  40' with RW and mod A, AFO    Time  4    Period  Weeks    Status  Revised    Target Date  03/14/18      PT SHORT TERM GOAL #5   Title  Pt will demonstrate safe power w/c mobility over indoor and outdoor surfaces >500' MOD I and negotiate ramped surfaces and around obstacles safely    Time  4    Period  Weeks    Status  New    Target Date  03/14/18        PT Long Term Goals - 02/06/18 1321      PT LONG TERM GOAL #1   Title  Pt will demonstrate ability to perform final HEP and walking program with supervision of family    Time  8    Period  Weeks    Status  New    Target Date  04/13/18      PT LONG TERM GOAL #2   Title  Pt will ambulate x 200' over indoor surfaces with LRAD, AFO and shoe lift with supervision    Time  8    Period  Weeks    Status  Revised    Target Date  04/13/18      PT LONG TERM GOAL  #3   Title  Pt will perform stand pivot transfers with LRAD, AFO and shoe lift with supervision to L and R    Time  8    Period  Weeks    Status  New    Target Date  04/13/18      PT LONG TERM GOAL #4   Title  Pt will improve BERG score to >/=20/56 to decr. falls risk.     Time  8    Period  Weeks    Status  New    Target Date  04/13/17            Plan - 03/19/18 0834    Clinical Impression Statement  Attempted to perform transfer from w/c > mat through stand pivot with RW and hand orthosis.  Pt unable to initiate L hip flexion to remove foot from foot rest and when pivoting pt demonstrated increased lateropulsion to L and inability to weight shift to R or advance LLE.  Performed assessment of vitals.  Also discussed other symptoms.  Son in law reports pt has been coughing this morning.  Pt also reports increase in urine output but denies burning or pain with urination.  Session cancelled for today and recommended pt make appointment with PCP to be assessed for infection.    Rehab Potential  Good    Clinical Impairments Affecting Rehab Potential  left side weakness; poor left side motor control    PT Frequency  2x / week    PT Duration  8 weeks    PT Treatment/Interventions  ADLs/Self Care Home Management;Biofeedback;Canalith Repostioning;Electrical Stimulation;Therapeutic activities;Therapeutic exercise;Manual techniques;Vestibular;Wheelchair mobility training;Functional mobility training;Orthotic Fit/Training;Stair training;Gait training;Patient/family education;DME Instruction;Neuromuscular re-education;Balance training    PT Next Visit Plan  CHECK STG!  Letta Moynahan  they got a RW so I gave them hand orthosis but I only wanted the grandkids to assist with this) NMR at counter active reaching and weight shifting forwards and to the R, add in rotation to R.  For gait: focus on active weight shift to R and activation of LLE during swing phase and sit <> stand.  cont to work on transfers (STS  and stand pivot with RW) ;make sure he is getting weight shift correct;  scifit was good tool for recipriocal mvt and UE involvement; he wants to try to get all the way around the gym with (RW with L h/o)       Patient will benefit from skilled therapeutic intervention in order to improve the following deficits and impairments:  Abnormal gait, Decreased endurance, Decreased knowledge of use of DME, Decreased strength, Impaired UE functional use, Decreased balance, Decreased mobility, Decreased range of motion, Decreased coordination, Impaired flexibility, Postural dysfunction  Visit Diagnosis: Muscle weakness (generalized)     Problem List Patient Active Problem List   Diagnosis Date Noted  . 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 Acute blood loss anemia   . Chronic diastolic (congestive) heart failure ([place])   . Hemiparesis affecting left side as late effect of cerebrovascular accident ([place])   . Diabetes mellitus type 2 in obese ([place])   . Small vessel disease ([place]) 08/11/2017  . Left hemiparesis ([place])   . Benign essential hypertension with delivery   . CVA (cerebral vascular accident) ([place]) 08/10/2017  . 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 of localized onset, not intractable, without status epilepticus ([place]) 06/20/2017  . Primary osteoarthritis of both knees 05/11/2017  . Stroke (cerebrum) ([place]) 03/21/2017  . Type 2 diabetes mellitus with complication, with long-term current use of insulin ([place]) 03/27/2015  . Atherosclerosis of native coronary artery of native heart without angina pectoris 03/27/2015  . Palpitations 11/04/2014  . S/P CABG (coronary artery bypass graft) 11/04/2014  . Atrial fibrillation, unspecified   . History of stroke 09/04/2014  . Obesity (BMI 30-39.9) 11/29/2013  . History of gout 05/16/2013  . Congestive heart failure ([place]) 04/20/2009  . DYSPNEA 02/11/2009  . CARDIOVASCULAR  FUNCTION STUDY, ABNORMAL 01/29/2009  . NECK PAIN 08/03/2007  . ANEMIA / OTHER 05/16/2007  . Coronary atherosclerosis 05/16/2007  . PAROXYSMAL ATRIAL FIBRILLATION 05/16/2007  . Subcortical infarction ([place]) 05/16/2007  . Hyperlipidemia 03/30/2007  . DEGENERATIVE JOINT DISEASE 03/30/2007  . PAROTID LESION, UNSPECIFIED 09/29/2006  . Partial epilepsy with impairment of consciousness ([place]) 09/29/2006  . Diabetic polyneuropathy ([place]) 09/29/2006  . Essential hypertension 09/29/2006  . PERIPHERAL VASCULAR DISEASE 09/29/2006  . DIVERTICULOSIS 09/29/2006   Rico Junker, PT, DPT 03/19/18    8:37 AM    [facility] [address] [place], [place], [address] Phone: [phone]   Fax:  [phone]  Name: TIRRELL BUCHBERGER MRN: [id number] Date of Birth: 11-22-1935

## 2018-03-19 NOTE — Telephone Encounter (Signed)
Contacted patient's PCP by phone to alert PCP of patient's fall, functional decline and fever today.  Primary care office to contact pt and set up appointment to see patient today.  Rico Junker, PT, DPT 03/19/18    8:43 AM

## 2018-03-19 NOTE — Progress Notes (Signed)
Acute Office Visit  Subjective:    Patient ID: Fred [name], male    DOB: 11/29/1935, 83 y.o.   MRN: [id number]  Chief Complaint  Patient presents with  . Fever    HPI Patient is in today for an low-grade fever.  He says yesterday he started coughing mostly dry, no significant sputum production.  When he was at PT they took his temperature and told him that he had a low-grade temperature but he does not remember the exact number.  He denies any sore throat or ear pain.  No GI symptoms such as nausea vomiting or diarrhea.  He has not been taking any cough or cold medication.  He has been exposed to the flu over the holidays from a family member who came to visit. He is coughing but says is feeling some better.     Past Medical History:  Diagnosis Date  . Atrial fibrillation ([place])    Post operative  . CAD (coronary artery disease)    s/p cabg  . CHF (congestive heart failure) ([place])   . Diverticulosis   . DJD (degenerative joint disease)   . DM (diabetes mellitus) ([place])   . History of anemia   . History of stroke   . Hyperlipidemia   . Hypertension   . 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 Peripheral vascular disease ([place])   . Polyneuropathy, diabetic ([place])   . Seizure disorder, complex partial ([place])   . Syncope and collapse     Past Surgical History:  Procedure Laterality Date  . CATARACT EXTRACTION W/ INTRAOCULAR LENS  IMPLANT, BILATERAL  06/2013   Dr. [name]  . CORONARY ARTERY BYPASS GRAFT  02/13/2004   4 vesel  . removal of sebaceous cyst from neck  08/2007   Dr. [name]  . TONSILLECTOMY      Family History  Problem Relation Age of Onset  . Epilepsy Father   . Stroke Mother   . Lung cancer Unknown     Social History   Socioeconomic History  . Marital status: Married    Spouse name: Daaiel Starlin  . Number of children: 3  . Years of education: Not on file  . Highest education level: Not on file  Occupational History  .  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 Financial resource strain: Not on file  . Food insecurity:    Worry: Not on file    Inability: Not on file  . Transportation needs:    Medical: Not on file    Non-medical: Not on file  Tobacco Use  . Smoking status: Former Smoker    Packs/day: 1.00    Years: 22.00    Pack years: 22.00    Types: Cigarettes    Last attempt to quit: 03/14/1977    Years since quitting: 41.0  . Smokeless tobacco: Never Used  Substance and Sexual Activity  . Alcohol use: Yes    Comment: Pt has not had alcohol since 03/2017 CVA  . Drug use: No  . Sexual activity: Never  Lifestyle  . Physical activity:    Days per week: Not on file    Minutes per session: Not on file  . Stress: Not on file  Relationships  . Social connections:    Talks on phone: Not on file    Gets together: Not on file    Attends religious service: Not on  file    Active member of club or organization: Not on file    Attends meetings of clubs or organizations: Not on file    Relationship status: Not on file  . Intimate partner violence:    Fear of current or ex partner: Not on file    Emotionally abused: Not on file    Physically abused: Not on file    Forced sexual activity: Not on file  Other Topics Concern  . Not on file  Social History Narrative   3 caffeinated drinks per day. Mostly coffee. He walks 15 minutes 3 days per week. Quit smoking in 1978.      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     Outpatient Medications Prior to Visit  Medication Sig Dispense Refill  . acetaminophen (TYLENOL) 325 MG tablet Take 2 tablets (650 mg total) by mouth every 4 (four) hours as needed for mild pain (or temp > 37.5 C (99.5 F)).    Marland Kitchen allopurinol (ZYLOPRIM) 300 MG tablet TAKE 1 TABLET BY MOUTH DAILY 90 tablet 1  . atorvastatin (LIPITOR) 80 MG tablet  TAKE 1 TABLET BY MOUTH DAILY 90 tablet 3  . clopidogrel (PLAVIX) 75 MG tablet Take 1 tablet (75 mg total) by mouth daily. 90 tablet 3  . Coenzyme Q10 (COQ10) 30 MG CAPS Take 1 tablet by mouth daily.      . diclofenac sodium (VOLTAREN) 1 % GEL Apply 2 g topically 4 (four) times daily. 400 g 12  . divalproex (DEPAKOTE ER) 500 MG 24 hr tablet Take 1 tablet (500 mg total) by mouth daily. 90 tablet 3  . ezetimibe (ZETIA) 10 MG tablet Take 1 tablet (10 mg total) by mouth daily. 90 tablet 3  . furosemide (LASIX) 20 MG tablet TAKE 1 TABLET BY MOUTH DAILY (Patient taking differently: Pt taking M,W,F) 90 tablet 1  . Insulin Glargine (LANTUS SOLOSTAR) 100 UNIT/ML Solostar Pen Inject 10-20 Units into the skin daily at 10 pm. (Patient taking differently: Inject 10 Units into the skin daily at 10 pm. ) 15 mL 11  . linagliptin (TRADJENTA) 5 MG TABS tablet Take 1 tablet (5 mg total) by mouth daily. insuranc would not cover januvia 30 tablet 5  . losartan (COZAAR) 25 MG tablet Take 1 tablet (25 mg total) by mouth daily. 90 tablet 1  . metFORMIN (GLUCOPHAGE) 500 MG tablet TAKE 1 TABLET BY MOUTH TWICE DAILY WITH A MEAL 180 tablet 1  . metoprolol succinate (TOPROL-XL) 50 MG 24 hr tablet Take 1 tablet (50 mg total) by mouth daily. Take with or immediately following a meal. 90 tablet 1  . niacin (NIASPAN) 500 MG CR tablet Take 1 tablet (500 mg total) by mouth at bedtime. 90 tablet 3  . NOVOFINE 32G X 6 MM MISC USE TO INJECT INSULIN DAILY 100 each 0  . senna-docusate (SENOKOT-S) 8.6-50 MG tablet Take 1 tablet by mouth 2 (two) times daily. (Patient taking differently: Take 1 tablet by mouth as needed. )     No facility-administered medications prior to visit.     Allergies  Allergen Reactions  . Lisinopril Cough    ROS     Objective:    Physical Exam  Constitutional: He is oriented to person, place, and time. He appears well-developed and well-nourished.  HENT:  Head: Normocephalic and atraumatic.  Right  Ear: External ear normal.  Left  Ear: External ear normal.  Nose: Nose normal.  Mouth/Throat: Oropharynx is clear and moist.  TMs and canals are clear.   Eyes: Pupils are equal, round, and reactive to light. Conjunctivae and EOM are normal.  Neck: Neck supple. No thyromegaly present.  Cardiovascular: Normal rate and normal heart sounds.  Pulmonary/Chest: Effort normal and breath sounds normal.  Some crackles in the left lower lung and dec BS in the left upper lobe.    Lymphadenopathy:    He has no cervical adenopathy.  Neurological: He is alert and oriented to person, place, and time.  Skin: Skin is warm and dry.  Psychiatric: He has a normal mood and affect.    BP (!) 96/53   Pulse 69   Temp 99 F (37.2 C)   SpO2 97%  Wt Readings from Last 3 Encounters:  03/02/18 163 lb (73.9 kg)  02/23/18 163 lb (73.9 kg)  01/12/18 163 lb (73.9 kg)    Health Maintenance Due  Topic Date Due  . OPHTHALMOLOGY EXAM  11/10/2017    There are no preventive care reminders to display for this patient.   Lab Results  Component Value Date   TSH 2.804 08/10/2017   Lab Results  Component Value Date   WBC 5.0 09/08/2017   HGB 12.8 (L) 09/08/2017   HCT 39.0 09/08/2017   MCV 95.6 09/08/2017   PLT 233 09/08/2017   Lab Results  Component Value Date   NA 141 09/11/2017   K 3.6 09/11/2017   CO2 29 09/11/2017   GLUCOSE 123 (H) 09/11/2017   BUN 20 09/11/2017   CREATININE 1.02 09/11/2017   BILITOT 1.0 08/14/2017   ALKPHOS 39 08/14/2017   AST 15 08/14/2017   ALT 17 08/14/2017   PROT 6.6 08/14/2017   ALBUMIN 3.2 (L) 08/14/2017   CALCIUM 9.1 09/11/2017   ANIONGAP 6 09/11/2017   GFR 84.99 05/16/2013   Lab Results  Component Value Date   CHOL 195 08/10/2017   Lab Results  Component Value Date   HDL 40 (L) 08/10/2017   Lab Results  Component Value Date   LDLCALC 130 (H) 08/10/2017   Lab Results  Component Value Date   TRIG 125 08/10/2017   Lab Results  Component Value Date    CHOLHDL 4.9 08/10/2017   Lab Results  Component Value Date   HGBA1C 7.4 (A) 03/02/2018       Assessment & Plan:   Problem List Items Addressed This Visit    None    Visit Diagnoses    Fever, unspecified fever cause    -  Primary   Relevant Orders   POCT Influenza A/B (Completed)   DG Chest 2 View   Cough       Relevant Orders   DG Chest 2 View   Viral upper respiratory tract infection         URI  with cough and fever-he did swab negative for the flu even though he did have a positive exposure in his home.  Because of hearing some crackles in that left lower lung I would feel more comfortable getting a chest x-ray especially since his stroke he has been less mobile and more sedentary which certainly would put him at risk of pneumonia.  Home Health called and said he was also having some urinary frequency but he didn't mention that during the office visit today's periods of chest x-ray is normal we will try to see if we can get a urine sample.  No orders of the defined types were placed in this encounter.    Beatrice Lecher, MD

## 2018-03-19 NOTE — Progress Notes (Signed)
Per recent results note:  "That nurse had called reporting that he had some increased urinary frequency which we were not aware of. We are happy to check a urine if he is able to come by sometime later today or tomorrow to have that rechecked."  Urinalysis and Culture ordered

## 2018-03-19 NOTE — Telephone Encounter (Signed)
Fred [name], physical therapist, called with update.  This morning, pt reported falling out of bed and also some left leg weakness. Per Fred [name], walking and functional status has declines.   Vitals were normal for pt, except a low grade fever. Pt has had a cough and also increased urination with urgency and frequency.   Forwarding note to PCP as [name], PT also sent note and pt also being seen today in office.

## 2018-03-20 ENCOUNTER — Ambulatory Visit: Payer: Medicare Other

## 2018-03-20 DIAGNOSIS — R350 Frequency of micturition: Secondary | ICD-10-CM | POA: Diagnosis not present

## 2018-03-20 DIAGNOSIS — R3915 Urgency of urination: Secondary | ICD-10-CM | POA: Diagnosis not present

## 2018-03-21 ENCOUNTER — Telehealth: Payer: Self-pay | Admitting: Physical Therapy

## 2018-03-21 ENCOUNTER — Ambulatory Visit: Payer: Medicare Other | Admitting: Physical Therapy

## 2018-03-21 LAB — URINALYSIS
Bilirubin Urine: NEGATIVE
GLUCOSE, UA: NEGATIVE
Hgb urine dipstick: NEGATIVE
Nitrite: NEGATIVE
Protein, ur: NEGATIVE
Specific Gravity, Urine: 1.018 (ref 1.001–1.03)
pH: <=5 (ref 5.0–8.0)

## 2018-03-21 LAB — URINE CULTURE
MICRO NUMBER:: 22571
SPECIMEN QUALITY:: ADEQUATE

## 2018-03-21 NOTE — Telephone Encounter (Signed)
Contacted patient by phone regarding missed appointment this morning.  Pt states he is still running a low grade fever and went for urine testing yesterday but has not heard back the results of that test.  Pt stated he thought that the doctor didn't want him to come to therapy today.  This was not indicated in the physician's appointment notes but treatment session cancelled today due to pt continuing to run a fever.  Reminded pt of his appointment on Monday and to contact clinic if he is still ill.  Pt verbalized understanding.   Rico Junker, PT, DPT 03/21/18    9:05 AM

## 2018-03-22 ENCOUNTER — Telehealth: Payer: Self-pay

## 2018-03-22 MED ORDER — BENZONATATE 200 MG PO CAPS: 200.0000 mg | ORAL_CAPSULE | Freq: Three times a day (TID) | ORAL | 0 refills | Status: DC | PRN

## 2018-03-22 NOTE — Telephone Encounter (Signed)
Script sent for tessalon perles

## 2018-03-22 NOTE — Telephone Encounter (Signed)
Fred [name] is still coughing and Butch Penny, his daughter, would like cough medication sent in.

## 2018-03-22 NOTE — Telephone Encounter (Signed)
Patient's daughter advised.  

## 2018-03-26 ENCOUNTER — Encounter: Payer: Medicare Other | Admitting: Occupational Therapy

## 2018-03-26 ENCOUNTER — Ambulatory Visit: Payer: Medicare Other | Attending: Physical Medicine & Rehabilitation | Admitting: Physical Therapy

## 2018-03-28 ENCOUNTER — Encounter: Payer: Self-pay | Admitting: Family Medicine

## 2018-03-28 ENCOUNTER — Ambulatory Visit (INDEPENDENT_AMBULATORY_CARE_PROVIDER_SITE_OTHER): Payer: Medicare Other | Admitting: Family Medicine

## 2018-03-28 VITALS — BP 105/59 | HR 99 | Temp 97.9°F | Ht 63.0 in | Wt 166.0 lb

## 2018-03-28 DIAGNOSIS — R82998 Other abnormal findings in urine: Secondary | ICD-10-CM

## 2018-03-28 DIAGNOSIS — I739 Peripheral vascular disease, unspecified: Secondary | ICD-10-CM | POA: Diagnosis not present

## 2018-03-28 DIAGNOSIS — R531 Weakness: Secondary | ICD-10-CM

## 2018-03-28 DIAGNOSIS — I952 Hypotension due to drugs: Secondary | ICD-10-CM | POA: Diagnosis not present

## 2018-03-28 NOTE — Patient Instructions (Addendum)
Cut your lipitor in half.   Ok to cut the losartan in half.  Ok to use the lasix as needed, or every 3 days.   OK to cut the allopurinol in half.

## 2018-03-28 NOTE — Progress Notes (Signed)
Acute Office Visit  Subjective:    Patient ID: Fred [name], male    DOB: March 26, 1935, 83 y.o.   MRN: [id number]  Chief Complaint  Patient presents with  . discolored urine  . Fatigue    HPI Patient is in today for dark urine x 3 weeks.  Says looks like dark tea.  He actually came in about 8 days ago with a cough and upper respiratory symptoms he had been exposed to the flu.  He says he is still continued with a cough though it feels like it has been gradually getting better in fact he says he was able to sleep well last night without the cough waking him up.  But he also reports that he has had dark urine for about 3 weeks.Marland Kitchen  He says he has been drinking plenty of fluid but per his family they feel like he is actually been fluid restricting because he has been urinating frequently and needs assistance and so has been backing off on his fluids.  He also has become significantly weak.  He was getting to the point where he was able to stand up at the kitchen counter without assistance and now cannot do so at all.  He also had some slight motion in his left arm and left shoulder and no longer can move it after his stroke.  It is a most like he is had a significant setback.  He denies any shortness of breath or fevers or chills except for when he came in a week ago with the cough.  His daughter who is here with him today reports that he is been a little bit more combative and irritable during the daytime.  He is also been holding his Lasix and just taking it more as needed.  Past Medical History:  Diagnosis Date  . Atrial fibrillation ([place])    Post operative  . CAD (coronary artery disease)    s/p cabg  . CHF (congestive heart failure) ([place])   . Diverticulosis   . DJD (degenerative joint disease)   . DM (diabetes mellitus) ([place])   . History of anemia   . History of stroke   . Hyperlipidemia   . Hypertension   . 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 Peripheral  vascular disease ([place])   . Polyneuropathy, diabetic ([place])   . Seizure disorder, complex partial ([place])   . Syncope and collapse     Past Surgical History:  Procedure Laterality Date  . CATARACT EXTRACTION W/ INTRAOCULAR LENS  IMPLANT, BILATERAL  06/2013   Dr. [name]  . CORONARY ARTERY BYPASS GRAFT  02/13/2004   4 vesel  . removal of sebaceous cyst from neck  08/2007   Dr. [name]  . TONSILLECTOMY      Family History  Problem Relation Age of Onset  . Epilepsy Father   . Stroke Mother   . Lung cancer Unknown     Social History   Socioeconomic History  . Marital status: Married    Spouse name: Arlin Sass  . Number of children: 3  . Years of education: Not on file  . Highest education level: Not on file  Occupational History  . 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 Financial resource strain: Not on file  . Food insecurity:    Worry: Not on file    Inability: Not on file  .  Transportation needs:    Medical: Not on file    Non-medical: Not on file  Tobacco Use  . Smoking status: Former Smoker    Packs/day: 1.00    Years: 22.00    Pack years: 22.00    Types: Cigarettes    Last attempt to quit: 03/14/1977    Years since quitting: 41.0  . Smokeless tobacco: Never Used  Substance and Sexual Activity  . Alcohol use: Yes    Comment: Pt has not had alcohol since 03/2017 CVA  . Drug use: No  . Sexual activity: Never  Lifestyle  . Physical activity:    Days per week: Not on file    Minutes per session: Not on file  . Stress: Not on file  Relationships  . Social connections:    Talks on phone: Not on file    Gets together: Not on file    Attends religious service: Not on file    Active member of club or organization: Not on file    Attends meetings of clubs or organizations: Not on file    Relationship status: Not on file  . Intimate partner violence:    Fear of current or ex partner: Not on  file    Emotionally abused: Not on file    Physically abused: Not on file    Forced sexual activity: Not on file  Other Topics Concern  . Not on file  Social History Narrative   3 caffeinated drinks per day. Mostly coffee. He walks 15 minutes 3 days per week. Quit smoking in 1978.      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     Outpatient Medications Prior to Visit  Medication Sig Dispense Refill  . acetaminophen (TYLENOL) 325 MG tablet Take 2 tablets (650 mg total) by mouth every 4 (four) hours as needed for mild pain (or temp > 37.5 C (99.5 F)).    Marland Kitchen allopurinol (ZYLOPRIM) 300 MG tablet TAKE 1 TABLET BY MOUTH DAILY 90 tablet 1  . atorvastatin (LIPITOR) 80 MG tablet TAKE 1 TABLET BY MOUTH DAILY 90 tablet 3  . clopidogrel (PLAVIX) 75 MG tablet Take 1 tablet (75 mg total) by mouth daily. 90 tablet 3  . Coenzyme Q10 (COQ10) 30 MG CAPS Take 1 tablet by mouth daily.      . diclofenac sodium (VOLTAREN) 1 % GEL Apply 2 g topically 4 (four) times daily. 400 g 12  . divalproex (DEPAKOTE ER) 500 MG 24 hr tablet Take 1 tablet (500 mg total) by mouth daily. 90 tablet 3  . ezetimibe (ZETIA) 10 MG tablet Take 1 tablet (10 mg total) by mouth daily. 90 tablet 3  . furosemide (LASIX) 20 MG tablet TAKE 1 TABLET BY MOUTH DAILY (Patient taking differently: Pt taking M,W,F) 90 tablet 1  . Insulin Glargine (LANTUS SOLOSTAR) 100 UNIT/ML Solostar Pen Inject 10-20 Units into the skin daily at 10 pm. (Patient taking differently: Inject 10 Units into the skin daily at 10 pm. ) 15 mL 11  . linagliptin (TRADJENTA) 5 MG TABS tablet Take 1 tablet (5 mg total) by mouth daily. insuranc would not cover januvia 30 tablet 5  . losartan (COZAAR) 25 MG tablet Take 1 tablet (25 mg total) by mouth daily. 90 tablet 1  . metFORMIN (GLUCOPHAGE) 500 MG tablet TAKE 1 TABLET BY MOUTH TWICE DAILY WITH A MEAL 180 tablet 1  .  metoprolol succinate  (TOPROL-XL) 50 MG 24 hr tablet Take 1 tablet (50 mg total) by mouth daily. Take with or immediately following a meal. 90 tablet 1  . niacin (NIASPAN) 500 MG CR tablet Take 1 tablet (500 mg total) by mouth at bedtime. 90 tablet 3  . NOVOFINE 32G X 6 MM MISC USE TO INJECT INSULIN DAILY 100 each 0  . senna-docusate (SENOKOT-S) 8.6-50 MG tablet Take 1 tablet by mouth 2 (two) times daily. (Patient taking differently: Take 1 tablet by mouth as needed. )    . benzonatate (TESSALON) 200 MG capsule Take 1 capsule (200 mg total) by mouth 3 (three) times daily as needed for cough. 30 capsule 0   No facility-administered medications prior to visit.     Allergies  Allergen Reactions  . Lisinopril Cough    ROS     Objective:    Physical Exam  Constitutional: He is oriented to person, place, and time. He appears well-developed and well-nourished.  HENT:  Head: Normocephalic and atraumatic.  Cardiovascular: Normal rate, regular rhythm and normal heart sounds.  Pulmonary/Chest: Effort normal and breath sounds normal.  Neurological: He is alert and oriented to person, place, and time.  He is unable to raise his left arm like he was previously.  Unable to clench the fingers together.  He is unable to stand without assistance.  Skin: Skin is warm and dry.  Psychiatric: He has a normal mood and affect. His behavior is normal.    BP (!) 105/59   Pulse 99   Temp 97.9 F (36.6 C)   Ht 5\' 3"  (1.6 m)   Wt 166 lb (75.3 kg)   SpO2 98%   BMI 29.41 kg/m  Wt Readings from Last 3 Encounters:  03/28/18 166 lb (75.3 kg)  03/02/18 163 lb (73.9 kg)  02/23/18 163 lb (73.9 kg)    There are no preventive care reminders to display for this patient.  There are no preventive care reminders to display for this patient.   Lab Results  Component Value Date   TSH 2.804 08/10/2017   Lab Results  Component Value Date   WBC 5.0 09/08/2017   HGB 12.8 (L) 09/08/2017   HCT 39.0 09/08/2017   MCV 95.6  09/08/2017   PLT 233 09/08/2017   Lab Results  Component Value Date   NA 141 09/11/2017   K 3.6 09/11/2017   CO2 29 09/11/2017   GLUCOSE 123 (H) 09/11/2017   BUN 20 09/11/2017   CREATININE 1.02 09/11/2017   BILITOT 1.0 08/14/2017   ALKPHOS 39 08/14/2017   AST 15 08/14/2017   ALT 17 08/14/2017   PROT 6.6 08/14/2017   ALBUMIN 3.2 (L) 08/14/2017   CALCIUM 9.1 09/11/2017   ANIONGAP 6 09/11/2017   GFR 84.99 05/16/2013   Lab Results  Component Value Date   CHOL 195 08/10/2017   Lab Results  Component Value Date   HDL 40 (L) 08/10/2017   Lab Results  Component Value Date   LDLCALC 130 (H) 08/10/2017   Lab Results  Component Value Date   TRIG 125 08/10/2017   Lab Results  Component Value Date   CHOLHDL 4.9 08/10/2017   Lab Results  Component Value Date   HGBA1C 7.4 (A) 03/02/2018       Assessment & Plan:   Problem List Items Addressed This Visit      Cardiovascular and Mediastinum   PERIPHERAL VASCULAR DISEASE   Relevant Orders   COMPLETE METABOLIC PANEL WITH GFR  CK (Creatine Kinase)   CBC   TSH   Sedimentation rate   B12    Other Visit Diagnoses    Dark urine    -  Primary   Relevant Orders   COMPLETE METABOLIC PANEL WITH GFR   CK (Creatine Kinase)   CBC   TSH   Sedimentation rate   B12   Weakness       Relevant Orders   COMPLETE METABOLIC PANEL WITH GFR   CK (Creatine Kinase)   CBC   TSH   Sedimentation rate   B12   Hypotension due to drugs         Hypotension-we will have him decrease his losartan to half a tab.  Okay to just use the Lasix more as needed which is really what is already been doing.  Encouraged him to hydrate more.  No sign of volume overload currently.  Muscle weakness/neurolysed weakness-okay to cut Lipitor in half and see if he notices any improvement over the weekend.  Will check for elevated CK levels.  Also check thyroid and check for anemia.  Dark Urine-he actually was having some dark urine when he came in about  a week ago for the upper respiratory symptoms.  At that time urinalysis was essentially unremarkable.  But we will repeat that today looking mostly for proteinuria and blood loss.  Also check renal function through the serum.   Beatrice Lecher, MD

## 2018-03-29 ENCOUNTER — Ambulatory Visit: Payer: Medicare Other | Admitting: Physical Therapy

## 2018-03-29 LAB — CK: Total CK: 31 U/L — ABNORMAL LOW (ref 44–196)

## 2018-03-29 LAB — COMPLETE METABOLIC PANEL WITH GFR
AG RATIO: 1.1 (calc) (ref 1.0–2.5)
ALT: 10 U/L (ref 9–46)
AST: 11 U/L (ref 10–35)
Albumin: 3.4 g/dL — ABNORMAL LOW (ref 3.6–5.1)
Alkaline phosphatase (APISO): 61 U/L (ref 40–115)
BILIRUBIN TOTAL: 0.4 mg/dL (ref 0.2–1.2)
BUN/Creatinine Ratio: 12 (calc) (ref 6–22)
BUN: 14 mg/dL (ref 7–25)
CO2: 31 mmol/L (ref 20–32)
Calcium: 9 mg/dL (ref 8.6–10.3)
Chloride: 99 mmol/L (ref 98–110)
Creat: 1.19 mg/dL — ABNORMAL HIGH (ref 0.70–1.11)
GFR, Est African American: 66 mL/min/{1.73_m2} (ref >=60)
GFR, Est Non African American: 57 mL/min/{1.73_m2} — ABNORMAL LOW (ref >=60)
Globulin: 3 g/dL (calc) (ref 1.9–3.7)
Glucose, Bld: 204 mg/dL — ABNORMAL HIGH (ref 65–99)
Potassium: 3.3 mmol/L — ABNORMAL LOW (ref 3.5–5.3)
Sodium: 142 mmol/L (ref 135–146)
Total Protein: 6.4 g/dL (ref 6.1–8.1)

## 2018-03-29 LAB — CBC
HCT: 39.1 % (ref 38.5–50.0)
HEMOGLOBIN: 13.4 g/dL (ref 13.2–17.1)
MCH: 32.5 pg (ref 27.0–33.0)
MCHC: 34.3 g/dL (ref 32.0–36.0)
MCV: 94.9 fL (ref 80.0–100.0)
MPV: 9.6 fL (ref 7.5–12.5)
Platelets: 410 10*3/uL — ABNORMAL HIGH (ref 140–400)
RBC: 4.12 10*6/uL — ABNORMAL LOW (ref 4.20–5.80)
RDW: 12.9 % (ref 11.0–15.0)
WBC: 15.7 10*3/uL — ABNORMAL HIGH (ref 3.8–10.8)

## 2018-03-29 LAB — VITAMIN B12: Vitamin B-12: 543 pg/mL (ref 200–1100)

## 2018-03-29 LAB — TSH: TSH: 0.99 mIU/L (ref 0.40–4.50)

## 2018-03-29 LAB — SEDIMENTATION RATE: Sed Rate: 28 mm/h — ABNORMAL HIGH (ref 0–20)

## 2018-03-30 ENCOUNTER — Telehealth: Payer: Self-pay | Admitting: *Deleted

## 2018-03-30 ENCOUNTER — Encounter: Payer: Self-pay | Admitting: *Deleted

## 2018-03-30 LAB — POCT URINALYSIS DIPSTICK
Bilirubin, UA: NEGATIVE
Blood, UA: NEGATIVE
Glucose, UA: POSITIVE — AB
Ketones, UA: NEGATIVE
Leukocytes, UA: NEGATIVE
Nitrite, UA: NEGATIVE
PH UA: 5.5 (ref 5.0–8.0)
Protein, UA: NEGATIVE
Spec Grav, UA: 1.015 (ref 1.010–1.025)
UROBILINOGEN UA: 0.2 U/dL

## 2018-03-30 NOTE — Telephone Encounter (Signed)
Spoke w/Donna and informed her that her father's urine sample could not be tested and that he would need to come back in and give another sample. I apologized for the inconvenience that this is causing her and her family and also informed her that I had left a vm on Mindy's phone informing her of this also. She stated that she would try to work on getting someone to get him here. I told her that we would be closed for lunch from 12-1 pm. I also advised her that I did not call him to let him know what had happened because the last time we called to advise him of results he did not recall the conversation.Elouise Munroe, [place]

## 2018-03-30 NOTE — Telephone Encounter (Signed)
lvm for pt's granddaughter informing her that we would need to recollect his urine. Asked her to rtn call.Maryruth Eve, [place]

## 2018-03-30 NOTE — Addendum Note (Signed)
Addended by: Teddy Spike on: 03/30/2018 02:45 PM   Modules accepted: Orders

## 2018-04-02 ENCOUNTER — Encounter: Payer: Medicare Other | Admitting: Occupational Therapy

## 2018-04-02 ENCOUNTER — Ambulatory Visit: Payer: Medicare Other | Admitting: Physical Therapy

## 2018-04-02 ENCOUNTER — Ambulatory Visit: Payer: Medicare Other | Admitting: Occupational Therapy

## 2018-04-02 ENCOUNTER — Telehealth: Payer: Self-pay

## 2018-04-02 NOTE — Telephone Encounter (Signed)
Patient's daughter advised.  

## 2018-04-02 NOTE — Telephone Encounter (Signed)
Fred [name] called and states they did his medications over the weekend and noticed his morning and evening tablets from last week was doubled. So he was taking 2 of all his medications for 5 days.

## 2018-04-02 NOTE — Telephone Encounter (Signed)
Explained the recent bump in his kidney function and his potassium being off.  He is actually supposed to come back this week to have a BMP checked so that we can make sure that it looks better.  Hopefully he is able to come in this week and have that done or if home health is coming out they may be able to draw there and send it to [place].  This is why I cut several meds in half when I saw him.

## 2018-04-05 ENCOUNTER — Ambulatory Visit: Payer: Medicare Other | Admitting: Physical Therapy

## 2018-04-05 ENCOUNTER — Encounter: Payer: Self-pay | Admitting: Neurology

## 2018-04-05 ENCOUNTER — Ambulatory Visit (INDEPENDENT_AMBULATORY_CARE_PROVIDER_SITE_OTHER): Payer: Medicare Other | Admitting: Neurology

## 2018-04-05 VITALS — BP 112/80 | HR 86 | Resp 16

## 2018-04-05 DIAGNOSIS — I639 Cerebral infarction, unspecified: Secondary | ICD-10-CM

## 2018-04-05 DIAGNOSIS — R29898 Other symptoms and signs involving the musculoskeletal system: Secondary | ICD-10-CM

## 2018-04-05 NOTE — Patient Instructions (Addendum)
1. Schedule MRI brain without contrast. We have sent a referral to [facility] for your MRI and they will call you directly to schedule your appt. They are located at [address]. If you need to contact them directly please call [phone]. 2. Continue all your medications 3. Follow-up as scheduled in May, call for any changes

## 2018-04-05 NOTE — Progress Notes (Addendum)
NEUROLOGY FOLLOW UP OFFICE NOTE  Fred [name] [id number] February 23, 1943  HISTORY OF PRESENT ILLNESS: I had the pleasure of seeing Fred [name] in follow-up in the neurology clinic on 04/05/2018. He presents for an earlier visit due to family reporting "he has gone downhill."  The patient was last seen 3 months ago for recurrent episodes of aphasia and confusion. He is again accompanied by his daughter who helps supplement the history today. It was noted that his medications were doubled up, but his daughter reports that it was not the patient to doubled up on his medications, but his granddaughter who briefly managed his medications did not realize there were 2 bottles of the same medication. His daughter is now back to managing his pillbox. He is pretty good with taking his medication from the pillbox. Their main concern today is a change in strength in his left hand. 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 He had left-sided weakness but could grip with his left hand. He reports that over the past month, he cannot grip anymore. He received Botox for left UE spasticity last 01/12/18. He reports his left leg weakness is unchanged. There is no pain, numbness or tingling. Right side is unaffected. His daughter notes that his face seemed a little droopy and he slurred his words a little this past week. Family has not noticed any episodes of confusion or gibberish speech since January 2019, he is taking Depakote ER 500mg  qhs with no side effects. He denies any headaches, dizziness, dysphagia, no falls.   History on Initial Assessment 06/07/2017: This is an 83 year old right-handed man with a history of hypertension, diabetes, CAD s/p CABG, post-operative atrial fibrillation, PVD, prior stroke with subsequent seizures, presenting to establish care. He had previously been seeing neurologist Dr. Erlinda Hong, records reviewed. He had  a stroke in June 2016 with left-sided weakness and confusion. He did not receive IV-TPA due to rapidly resolving deficits. His MRI brain showed an acute small infarct in the right posterior frontal lobe and remote small thalamic infarcts. Stroke workup was unremarkable except for elevated HbA1c of 7.3 and EF 45-40%. Due to questionable seizure, he was discharged home on Depakote ER 500mg  daily. There is note of a confusional episode when he had a CABG done in 2015 with post-procedure atrial fibrillation, he had an EEG, results unavailable. On his follow-up with Dr. Erlinda Hong in August 2016, they asked about continuing Depakote for seizure and it was discontinued. He was off Depakote on follow-up in November 2017. On his last visit with Dr. Erlinda Hong in July 2018, it was noted that he was put back on Depakote by his PCP for seizure prevention in May 2018 (refills were sent and a referral to Neurology was done at that time). On 03/21/2017 he had an episode of garbled speech and confusion. They could not make out what he was saying, it was all gibberish. He was getting frustrated because they could not understand him. He was holding a can of soda in his hand and clenched on it so hard the soda spilled. He started making words in 3-4 minutes. When EMS arrived, speech was still slurred, he was still confused and agreed to go to the hospital. He was brought to [facility] where he was noted to be aphasic with probable right-sided neglect and homonymous hemianopia. Family reports he said he was 83 years old and did not know the  president. Head CT and CTA were negative, due to concern for stroke he was given IV-TPA within the 4-1/2 hour window. His daughter reports that within 15-20 minutes after TPA, they saw a dramatic change. His MRI brain did not show any infarct. It was noted he had a stroke-like episode treated with IV-TPA with full recovery, possible complex partial seizure. He had an EEG which was mildly abnormal due to mild background  slowing. It is unclear when Depakote was stopped, but family reports it was restarted at that time (Depakote level on admission was <10). He was discharged home on Depakote ER 500mg  qhs and has overall been doing better.  He does not recall the hospitalization in January 2019 and denies having any difficulties. His daughter also reports an episode in 2017 where he came out of the car looking straight ahead, stammering, mumbling but not saying real words. Then he just came out of it and was fine. They brought him to the hospital (no records on Epic) and was told it was probably a TIA and seizure. His daughter denies any other staring/unresponsive episodes. He denies any olfactory/gustatory hallucinations, deja vu, rising epigastric sensation, focal numbness/tingling/weakness, myoclonic jerks. He denies any headaches, dizziness, diplopia, dysarthria/dysphagia, neck/back pain, bowel/bladder dysfunction. Sleep is good. He has some knee pain and leg cramps.   Update 12/13/2017: Since his last visit, his daughter denies any further confusion episodes or gibberish speech, however he had another stroke last 08/09/17. Records were reviewed. He started having left-sided weakness and was brought to the ER, initially with 4+/5 strength. NIH of 1. Over the course of his stay, he had progressive worsening left hemiparesis and slurred speech. MRI brain showed right posterior limb of internal capsule stroke. 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 CTA head and neck showed <50 right ICA stenosis, 70% left ICA stenosis, LDL 130, HbA1c 6.0, normal echocardiogram. He had been taking aspirin 325mg  daily prior to this stroke, he was discharged home on Plavix and aspirin to take for 3 weeks, then has been back to aspirin alone. There has been some improvement in left sided weakness, he can now push with his left leg and slightly lift up his left arm. No  numbness/tingling. He manages his own medications. He will be starting outpatient PT/OT tomorrow and looks forward to it.   Epilepsy Risk Factors:  His father had seizures. Prior stroke. Otherwise he had a normal birth and early development.  There is no history of febrile convulsions, CNS infections such as meningitis/encephalitis, significant traumatic brain injury, neurosurgical procedures.  PAST MEDICAL HISTORY: Past Medical History:  Diagnosis Date  . Atrial fibrillation ([place])    Post operative  . CAD (coronary artery disease)    s/p cabg  . CHF (congestive heart failure) ([place])   . Diverticulosis   . DJD (degenerative joint disease)   . DM (diabetes mellitus) ([place])   . History of anemia   . History of stroke   . Hyperlipidemia   . Hypertension   . 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 Peripheral vascular disease ([place])   . Polyneuropathy, diabetic ([place])   . Seizure disorder, complex partial ([place])   . Syncope and collapse     MEDICATIONS: Current Outpatient Medications on File Prior to Visit  Medication Sig Dispense Refill  . acetaminophen (TYLENOL) 325 MG tablet Take 2 tablets (650 mg total) by mouth every 4 (four) hours as needed for  mild pain (or temp > 37.5 C (99.5 F)).    Marland Kitchen allopurinol (ZYLOPRIM) 300 MG tablet TAKE 1 TABLET BY MOUTH DAILY 90 tablet 1  . atorvastatin (LIPITOR) 80 MG tablet TAKE 1 TABLET BY MOUTH DAILY 90 tablet 3  . clopidogrel (PLAVIX) 75 MG tablet Take 1 tablet (75 mg total) by mouth daily. 90 tablet 3  . Coenzyme Q10 (COQ10) 30 MG CAPS Take 1 tablet by mouth daily.      . diclofenac sodium (VOLTAREN) 1 % GEL Apply 2 g topically 4 (four) times daily. 400 g 12  . divalproex (DEPAKOTE ER) 500 MG 24 hr tablet Take 1 tablet (500 mg total) by mouth daily. 90 tablet 3  . ezetimibe (ZETIA) 10 MG tablet Take 1 tablet (10 mg total) by mouth daily. 90 tablet 3  . furosemide (LASIX) 20 MG tablet TAKE 1 TABLET BY MOUTH DAILY (Patient taking  differently: Pt taking M,W,F) 90 tablet 1  . Insulin Glargine (LANTUS SOLOSTAR) 100 UNIT/ML Solostar Pen Inject 10-20 Units into the skin daily at 10 pm. (Patient taking differently: Inject 10 Units into the skin daily at 10 pm. ) 15 mL 11  . linagliptin (TRADJENTA) 5 MG TABS tablet Take 1 tablet (5 mg total) by mouth daily. insuranc would not cover januvia 30 tablet 5  . losartan (COZAAR) 25 MG tablet Take 1 tablet (25 mg total) by mouth daily. 90 tablet 1  . metFORMIN (GLUCOPHAGE) 500 MG tablet TAKE 1 TABLET BY MOUTH TWICE DAILY WITH A MEAL 180 tablet 1  . metoprolol succinate (TOPROL-XL) 50 MG 24 hr tablet Take 1 tablet (50 mg total) by mouth daily. Take with or immediately following a meal. 90 tablet 1  . niacin (NIASPAN) 500 MG CR tablet Take 1 tablet (500 mg total) by mouth at bedtime. 90 tablet 3  . NOVOFINE 32G X 6 MM MISC USE TO INJECT INSULIN DAILY 100 each 0  . senna-docusate (SENOKOT-S) 8.6-50 MG tablet Take 1 tablet by mouth 2 (two) times daily. (Patient taking differently: Take 1 tablet by mouth as needed. )     No current facility-administered medications on file prior to visit.     ALLERGIES: Allergies  Allergen Reactions  . Lisinopril Cough    FAMILY HISTORY: Family History  Problem Relation Age of Onset  . Epilepsy Father   . Stroke Mother   . Lung cancer Other     SOCIAL HISTORY: Social History   Socioeconomic History  . Marital status: Married    Spouse name: Hades Mathew  . Number of children: 3  . Years of education: Not on file  . Highest education level: Not on file  Occupational History  . 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 Financial resource strain: Not on file  . Food insecurity:    Worry: Not on file    Inability: Not on file  . Transportation needs:    Medical: Not on file    Non-medical: Not on file  Tobacco Use  . Smoking status: Former Smoker    Packs/day: 1.00     Years: 22.00    Pack years: 22.00    Types: Cigarettes    Last attempt to quit: 03/14/1977    Years since quitting: 41.0  . Smokeless tobacco: Never Used  Substance and Sexual Activity  . Alcohol use: Yes    Comment: Pt has not had alcohol since 03/2017 CVA  .  Drug use: No  . Sexual activity: Never  Lifestyle  . Physical activity:    Days per week: Not on file    Minutes per session: Not on file  . Stress: Not on file  Relationships  . Social connections:    Talks on phone: Not on file    Gets together: Not on file    Attends religious service: Not on file    Active member of club or organization: Not on file    Attends meetings of clubs or organizations: Not on file    Relationship status: Not on file  . Intimate partner violence:    Fear of current or ex partner: Not on file    Emotionally abused: Not on file    Physically abused: Not on file    Forced sexual activity: Not on file  Other Topics Concern  . Not on file  Social History Narrative   3 caffeinated drinks per day. Mostly coffee. He walks 15 minutes 3 days per week. Quit smoking in 1978.      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     REVIEW OF SYSTEMS: Constitutional: No fevers, chills, or sweats, no generalized fatigue, change in appetite Eyes: No visual changes, double vision, eye pain Ear, nose and throat: No hearing loss, ear pain, nasal congestion, sore throat Cardiovascular: No chest pain, palpitations Respiratory:  No shortness of breath at rest or with exertion, wheezes GastrointestinaI: No nausea, vomiting, diarrhea, abdominal pain, fecal incontinence Genitourinary:  No dysuria, urinary retention or frequency Musculoskeletal:  No neck pain, back pain Integumentary: No rash, pruritus, skin lesions Neurological: as above Psychiatric: No depression, insomnia, anxiety Endocrine: No palpitations, fatigue, diaphoresis,  mood swings, change in appetite, change in weight, increased thirst Hematologic/Lymphatic:  No anemia, purpura, petechiae. Allergic/Immunologic: no itchy/runny eyes, nasal congestion, recent allergic reactions, rashes  PHYSICAL EXAM: Vitals:   04/05/18 1400  BP: 112/80  Pulse: 86  Resp: 16  SpO2: 96%   General: No acute distress, sitting on wheelchair Head:  Normocephalic/atraumatic Neck: supple, no paraspinal tenderness, full range of motion Heart:  Regular rate and rhythm Lungs:  Clear to auscultation bilaterally Back: No paraspinal tenderness Skin/Extremities: No rash, no edema Neurological Exam: alert and oriented to person, place, and time. No aphasia or dysarthria. Fund of knowledge is appropriate.  Recent and remote memory are intact.  Attention and concentration are normal.    Able to name objects and repeat phrases.  Montreal Cognitive Assessment  04/05/2018  Visuospatial/ Executive (0/5) 3  Naming (0/3) 3  Attention: Read list of digits (0/2) 2  Attention: Read list of letters (0/1) 0  Attention: Serial 7 subtraction starting at 100 (0/3) 1  Language: Repeat phrase (0/2) 1  Language : Fluency (0/1) 0  Abstraction (0/2) 0  Delayed Recall (0/5) 0  Orientation (0/6) 4  Total 14  Adjusted Score (based on education) 15   Cranial nerves: Pupils equal, round, reactive to light.  Extraocular movements intact with no nystagmus. Visual fields full. Facial sensation intact. No facial asymmetry. Tongue, uvula, palate midline.  Motor: Increased tone on left UE. 5/5 on right UE and LE, 3/5 proximal left UE, 0/5 distal left UE, 2/5 left LE. Sensation to light touch, temperature and vibration intact.  No extinction to double simultaneous stimulation.  Deep tendon reflexes brisk +3 on left UE, +2 on left LE and right  side. Finger to nose testing intact on right. Gait not tested, sitting on wheelchair, needs 2-person assist.  IMPRESSION: This is a 83 yo RH man with a history of  hypertension, diabetes, CAD s/p CABG, post-operative atrial fibrillation, PVD, seizures, and recurrent strokes. No further episodes of confusion with aphasia since January 2019 on Depakote ER 500mg  qhs. He presents for an earlier visit due to worsening left hand weakness over the past month. He had left-sided weakness from his subcortical stroke in May 2019, but now reports inability to move his hand, which is new. We discussed likely extension of stroke, there was severe small vessel disease. Repeat MRI brain without contrast will be ordered to further evaluate his symptoms. We discussed keeping SBP>120-130 to avoid hypoperfusion, continue daily Plavix and statin. Continue with PT/OT. They know to go to the ER immediately for any sudden change in symptoms. There was initial concern for memory due to report of double taking medication, MOCA score today 15/30, continue to monitor. Apparently family made the medication mistake, not the patient. Follow-up as scheduled in May 2020, they know to call for any changes.   Thank you for allowing me to participate in his care.  Please do not hesitate to call for any questions or concerns.  The duration of this appointment visit was 30 minutes of face-to-face time with the patient.  Greater than 50% of this time was spent in counseling, explanation of diagnosis, planning of further management, and coordination of care.   Ellouise Newer, M.D.   CC: Dr. Madilyn Fireman

## 2018-04-06 ENCOUNTER — Encounter: Payer: Medicare Other | Attending: Physical Medicine & Rehabilitation

## 2018-04-06 ENCOUNTER — Ambulatory Visit: Payer: Medicare Other | Admitting: Physical Medicine & Rehabilitation

## 2018-04-06 DIAGNOSIS — I2510 Atherosclerotic heart disease of native coronary artery without angina pectoris: Secondary | ICD-10-CM | POA: Insufficient documentation

## 2018-04-06 DIAGNOSIS — E1142 Type 2 diabetes mellitus with diabetic polyneuropathy: Secondary | ICD-10-CM | POA: Insufficient documentation

## 2018-04-06 DIAGNOSIS — Z951 Presence of aortocoronary bypass graft: Secondary | ICD-10-CM | POA: Insufficient documentation

## 2018-04-06 DIAGNOSIS — Z7982 Long term (current) use of aspirin: Secondary | ICD-10-CM | POA: Insufficient documentation

## 2018-04-06 DIAGNOSIS — I5032 Chronic diastolic (congestive) heart failure: Secondary | ICD-10-CM | POA: Insufficient documentation

## 2018-04-06 DIAGNOSIS — Z79899 Other long term (current) drug therapy: Secondary | ICD-10-CM | POA: Insufficient documentation

## 2018-04-06 DIAGNOSIS — I110 Hypertensive heart disease with heart failure: Secondary | ICD-10-CM | POA: Insufficient documentation

## 2018-04-06 DIAGNOSIS — E785 Hyperlipidemia, unspecified: Secondary | ICD-10-CM | POA: Insufficient documentation

## 2018-04-06 DIAGNOSIS — G40909 Epilepsy, unspecified, not intractable, without status epilepticus: Secondary | ICD-10-CM | POA: Insufficient documentation

## 2018-04-06 DIAGNOSIS — I6523 Occlusion and stenosis of bilateral carotid arteries: Secondary | ICD-10-CM | POA: Insufficient documentation

## 2018-04-06 DIAGNOSIS — Z794 Long term (current) use of insulin: Secondary | ICD-10-CM | POA: Insufficient documentation

## 2018-04-06 DIAGNOSIS — Z87891 Personal history of nicotine dependence: Secondary | ICD-10-CM | POA: Insufficient documentation

## 2018-04-06 DIAGNOSIS — I69354 Hemiplegia and hemiparesis following cerebral infarction affecting left non-dominant side: Secondary | ICD-10-CM | POA: Insufficient documentation

## 2018-04-06 DIAGNOSIS — E1151 Type 2 diabetes mellitus with diabetic peripheral angiopathy without gangrene: Secondary | ICD-10-CM | POA: Insufficient documentation

## 2018-04-09 ENCOUNTER — Encounter: Payer: Medicare Other | Admitting: Occupational Therapy

## 2018-04-09 ENCOUNTER — Ambulatory Visit: Payer: Medicare Other | Admitting: Rehabilitation

## 2018-04-09 ENCOUNTER — Ambulatory Visit: Payer: Medicare Other | Admitting: Occupational Therapy

## 2018-04-10 ENCOUNTER — Other Ambulatory Visit: Payer: Self-pay

## 2018-04-10 ENCOUNTER — Ambulatory Visit (HOSPITAL_BASED_OUTPATIENT_CLINIC_OR_DEPARTMENT_OTHER): Payer: Medicare Other | Admitting: Physical Medicine & Rehabilitation

## 2018-04-10 ENCOUNTER — Encounter: Payer: Self-pay | Admitting: Physical Medicine & Rehabilitation

## 2018-04-10 VITALS — BP 129/72 | HR 66

## 2018-04-10 DIAGNOSIS — E785 Hyperlipidemia, unspecified: Secondary | ICD-10-CM | POA: Diagnosis not present

## 2018-04-10 DIAGNOSIS — I5032 Chronic diastolic (congestive) heart failure: Secondary | ICD-10-CM | POA: Diagnosis not present

## 2018-04-10 DIAGNOSIS — I6523 Occlusion and stenosis of bilateral carotid arteries: Secondary | ICD-10-CM | POA: Diagnosis not present

## 2018-04-10 DIAGNOSIS — I2510 Atherosclerotic heart disease of native coronary artery without angina pectoris: Secondary | ICD-10-CM | POA: Diagnosis not present

## 2018-04-10 DIAGNOSIS — G8110 Spastic hemiplegia affecting unspecified side: Secondary | ICD-10-CM | POA: Diagnosis not present

## 2018-04-10 DIAGNOSIS — Z794 Long term (current) use of insulin: Secondary | ICD-10-CM | POA: Diagnosis not present

## 2018-04-10 DIAGNOSIS — E1151 Type 2 diabetes mellitus with diabetic peripheral angiopathy without gangrene: Secondary | ICD-10-CM | POA: Diagnosis not present

## 2018-04-10 DIAGNOSIS — Z79899 Other long term (current) drug therapy: Secondary | ICD-10-CM | POA: Diagnosis not present

## 2018-04-10 DIAGNOSIS — Z7982 Long term (current) use of aspirin: Secondary | ICD-10-CM | POA: Diagnosis not present

## 2018-04-10 DIAGNOSIS — I110 Hypertensive heart disease with heart failure: Secondary | ICD-10-CM | POA: Diagnosis not present

## 2018-04-10 DIAGNOSIS — Z87891 Personal history of nicotine dependence: Secondary | ICD-10-CM | POA: Diagnosis not present

## 2018-04-10 DIAGNOSIS — G40909 Epilepsy, unspecified, not intractable, without status epilepticus: Secondary | ICD-10-CM | POA: Diagnosis not present

## 2018-04-10 DIAGNOSIS — E1142 Type 2 diabetes mellitus with diabetic polyneuropathy: Secondary | ICD-10-CM | POA: Diagnosis not present

## 2018-04-10 DIAGNOSIS — I69354 Hemiplegia and hemiparesis following cerebral infarction affecting left non-dominant side: Secondary | ICD-10-CM | POA: Diagnosis not present

## 2018-04-10 DIAGNOSIS — Z951 Presence of aortocoronary bypass graft: Secondary | ICD-10-CM | POA: Diagnosis not present

## 2018-04-10 NOTE — Progress Notes (Signed)
Botox Injection for spasticity using needle EMG guidance  Dilution: 50 Units/ml Indication: Severe spasticity which interferes with ADL,mobility and/or  hygiene and is unresponsive to medication management and other conservative care Informed consent was obtained after describing risks and benefits of the procedure with the patient. This includes bleeding, bruising, infection, excessive weakness, or medication side effects. A REMS form is on file and signed. Needle: 25g 2" needle electrode Number of units per muscle Left FDS 25 2+ Left FDP 25 1+ Left Pec 75 1+ L FCR 25 2+ L biceps 25 2+ Left Brachiorad 25 2+ All injections were done after obtaining appropriate EMG activity and after negative drawback for blood. Based on EMG activity may be able to reduce dose to pec and FDP and overall dose to 100U but will need to follow up clinically The patient tolerated the procedure well. Post procedure instructions were given. A followup appointment was made.  PMR f/u 6 wk

## 2018-04-11 ENCOUNTER — Encounter: Payer: Self-pay | Admitting: Neurology

## 2018-04-12 ENCOUNTER — Other Ambulatory Visit: Payer: Self-pay

## 2018-04-12 ENCOUNTER — Ambulatory Visit: Payer: Medicare Other | Admitting: Physical Therapy

## 2018-04-12 DIAGNOSIS — Z794 Long term (current) use of insulin: Principal | ICD-10-CM

## 2018-04-12 DIAGNOSIS — E118 Type 2 diabetes mellitus with unspecified complications: Secondary | ICD-10-CM

## 2018-04-12 MED ORDER — BLOOD GLUCOSE METER KIT: PACK | 0 refills | Status: DC

## 2018-04-12 MED ORDER — INSULIN GLARGINE 100 UNIT/ML SOLOSTAR PEN: 10.0000 [IU] | PEN_INJECTOR | Freq: Every day | SUBCUTANEOUS | 11 refills | Status: DC

## 2018-04-12 MED ORDER — BLOOD GLUCOSE METER KIT: PACK | 0 refills | Status: AC

## 2018-04-13 ENCOUNTER — Other Ambulatory Visit: Payer: Self-pay | Admitting: *Deleted

## 2018-04-13 ENCOUNTER — Other Ambulatory Visit: Payer: Self-pay | Admitting: Family Medicine

## 2018-04-13 DIAGNOSIS — Z794 Long term (current) use of insulin: Principal | ICD-10-CM

## 2018-04-13 DIAGNOSIS — E118 Type 2 diabetes mellitus with unspecified complications: Secondary | ICD-10-CM

## 2018-04-13 MED ORDER — INSULIN PEN NEEDLE 32G X 6 MM MISC: 99 refills | Status: DC

## 2018-04-13 MED ORDER — INSULIN DEGLUDEC 100 UNIT/ML ~~LOC~~ SOLN: 10.0000 [IU] | Freq: Every day | SUBCUTANEOUS | 11 refills | Status: DC

## 2018-04-13 NOTE — Telephone Encounter (Signed)
Pt's plan does not cover lantus. Alternative include Basaglar or [place]. Advised by pcp to switch to [place].Maryruth Eve, [place]ahoma Crocker, CMA

## 2018-04-16 ENCOUNTER — Telehealth: Payer: Self-pay | Admitting: Neurology

## 2018-04-16 ENCOUNTER — Other Ambulatory Visit: Payer: Self-pay | Admitting: Family Medicine

## 2018-04-16 DIAGNOSIS — R29898 Other symptoms and signs involving the musculoskeletal system: Secondary | ICD-10-CM

## 2018-04-16 MED ORDER — LOSARTAN POTASSIUM 25 MG PO TABS: 25.0000 mg | ORAL_TABLET | Freq: Every day | ORAL | 1 refills | Status: DC

## 2018-04-16 MED ORDER — METOPROLOL SUCCINATE ER 50 MG PO TB24: 50.0000 mg | ORAL_TABLET | Freq: Every day | ORAL | 1 refills | Status: DC

## 2018-04-16 NOTE — Telephone Encounter (Signed)
Pls order occupation therapy for left hand weakness, thanks

## 2018-04-16 NOTE — Addendum Note (Signed)
Addended by: Lenny Pastel on: 04/16/2018 12:38 PM   Modules accepted: Orders

## 2018-04-16 NOTE — Telephone Encounter (Signed)
Fred [name] needs refills. Never prescribed by Dr Madilyn Fireman.

## 2018-04-16 NOTE — Telephone Encounter (Signed)
Daughter was calling in about some orders for in home rehab for this patient. She is needing some orders. Please send this or call her back at [phone]. Thanks!

## 2018-04-16 NOTE — Telephone Encounter (Signed)
Order placed

## 2018-04-16 NOTE — Telephone Encounter (Signed)
pls advise

## 2018-04-18 ENCOUNTER — Ambulatory Visit (INDEPENDENT_AMBULATORY_CARE_PROVIDER_SITE_OTHER): Payer: Medicare Other | Admitting: Family Medicine

## 2018-04-18 ENCOUNTER — Encounter: Payer: Self-pay | Admitting: Family Medicine

## 2018-04-18 VITALS — BP 125/50 | HR 62

## 2018-04-18 DIAGNOSIS — E876 Hypokalemia: Secondary | ICD-10-CM | POA: Diagnosis not present

## 2018-04-18 DIAGNOSIS — R82998 Other abnormal findings in urine: Secondary | ICD-10-CM | POA: Diagnosis not present

## 2018-04-18 DIAGNOSIS — I952 Hypotension due to drugs: Secondary | ICD-10-CM | POA: Diagnosis not present

## 2018-04-18 DIAGNOSIS — R531 Weakness: Secondary | ICD-10-CM

## 2018-04-18 LAB — POCT URINALYSIS DIPSTICK
Bilirubin, UA: NEGATIVE
Blood, UA: NEGATIVE
Glucose, UA: NEGATIVE
Ketones, UA: NEGATIVE
Leukocytes, UA: NEGATIVE
NITRITE UA: NEGATIVE
PROTEIN UA: NEGATIVE
Spec Grav, UA: 1.02 (ref 1.010–1.025)
Urobilinogen, UA: 1 E.U./dL
pH, UA: 5.5 (ref 5.0–8.0)

## 2018-04-18 NOTE — Progress Notes (Signed)
Subjective:    CC: low BP.    HPI:  Hypotension -3-week follow-up for hypertension.  We cut his losartan tab in half and encouraged him to just use his Lasix more as needed for swelling.  Also encouraged him to hydrate more. He is feeling much better than he was.    Myalgias/muscle weakness-we discussed cutting his Lipitor in half to see if this helps with his muscle weakness that he had been experiencing. CK was normal.  He is feeling some better abut still no change or improvement of weakness on the right side of his body.  He is schedule for MRI with Neurology later this week to make sure hasn't had nother stroke.    Gout-We also cut his allopurinol in half as well.  Hypokalemia- due to recheck labs.    Past medical history, Surgical history, Family history not pertinant except as noted below, Social history, Allergies, and medications have been entered into the medical record, reviewed, and corrections made.   Review of Systems: No fevers, chills, night sweats, weight loss, chest pain, or shortness of breath.   Objective:    General: Well Developed, well nourished, and in no acute distress.  Neuro: Alert and oriented x3, extra-ocular muscles intact, sensation grossly intact.  HEENT: Normocephalic, atraumatic  Skin: Warm and dry, no rashes. Cardiac: Regular rate and rhythm, no murmurs rubs or gallops, no lower extremity edema.  Respiratory: Clear to auscultation bilaterally. Not using accessory muscles, speaking in full sentences.   Impression and Recommendations:   DM - last A1C above goal. Will monitor.   Lab Results  Component Value Date   HGBA1C 7.4 (A) 03/02/2018    Hypokalemia - due to recheck potassium.    Hypotension due to drugs.  -  Keep cutting your losartan in half.   BP is better today.    Weakness - Ok to restart whole tab of lipitor so no big change in weakness on decreased dose.    Dark urine - repeat UA is normal.

## 2018-04-18 NOTE — Patient Instructions (Addendum)
Ok to increase your atorvastatin to whole tab daily.  Keep cutting your losartan in half.

## 2018-04-19 ENCOUNTER — Ambulatory Visit: Payer: Medicare Other | Admitting: Neurology

## 2018-04-23 ENCOUNTER — Ambulatory Visit
Admission: RE | Admit: 2018-04-23 | Discharge: 2018-04-23 | Disposition: A | Discharge status: Home / Self Care | Payer: Medicare Other | Source: Ambulatory Visit | Attending: Neurology | Admitting: Neurology

## 2018-04-23 DIAGNOSIS — R531 Weakness: Secondary | ICD-10-CM | POA: Diagnosis not present

## 2018-04-23 DIAGNOSIS — I639 Cerebral infarction, unspecified: Secondary | ICD-10-CM | POA: Diagnosis not present

## 2018-04-23 DIAGNOSIS — R29898 Other symptoms and signs involving the musculoskeletal system: Secondary | ICD-10-CM

## 2018-04-24 ENCOUNTER — Encounter: Payer: Self-pay | Admitting: Rehabilitation

## 2018-04-24 NOTE — Therapy (Signed)
[facility] [address] [place], [place], [address] Phone: [phone]   Fax:  [phone]  Patient Details  Name: Fred [name] MRN: [id number] Date of Birth: 12-20-1935 Referring Provider:  No ref. provider found  Encounter Date: 04/24/2018   PHYSICAL THERAPY DISCHARGE SUMMARY  Visits from Start of Care: 21  Current functional level related to goals / functional outcomes: Unsure as pt had a reported decline in status, family felt he had had another CVA.  PT then spoke with daughter who was to speak with pt regarding continuing at OP vs HHPT.  Have not heard from them, but see in chart orders for [place].  Will D/C at this time and will need new order when he is to return to OP.    Remaining deficits: PT Long Term Goals - 02/06/18 1321      PT LONG TERM GOAL #1   Title  Pt will demonstrate ability to perform final HEP and walking program with supervision of family    Time  8    Period  Weeks    Status  New    Target Date  04/13/18      PT LONG TERM GOAL #2   Title  Pt will ambulate x 200' over indoor surfaces with LRAD, AFO and shoe lift with supervision    Time  8    Period  Weeks    Status  Revised    Target Date  04/13/18      PT LONG TERM GOAL #3   Title  Pt will perform stand pivot transfers with LRAD, AFO and shoe lift with supervision to L and R    Time  8    Period  Weeks    Status  New    Target Date  04/13/18      PT LONG TERM GOAL #4   Title  Pt will improve BERG score to >/=20/56 to decr. falls risk.     Time  8    Period  Weeks    Status  New    Target Date  04/13/17         Education / Equipment: HEP   Plan: Patient agrees to discharge.  Patient goals were not met. Patient is being discharged due to a change in medical status.  ?????    Cameron Sprang, PT, MPT [facility] [address] [place], [place], [address] Phone: [phone]   Fax:   [phone] 04/24/18, 9:21 AM

## 2018-04-25 ENCOUNTER — Telehealth: Payer: Self-pay

## 2018-04-25 ENCOUNTER — Telehealth: Payer: Self-pay | Admitting: Neurology

## 2018-04-25 NOTE — Telephone Encounter (Signed)
Fred [name]'s daughter called and reports the neurologist wants Dr Madilyn Fireman to order in home PT for patient. The neurologist thinks patient may have had another mild stroke. Please advise.

## 2018-04-25 NOTE — Telephone Encounter (Signed)
Patient's daughter called and is needing to see about a him having  PT Outpatient and she is wanting Home Health in [place]. Please Call. Thanks

## 2018-04-26 NOTE — Telephone Encounter (Signed)
pls advise.    Also - pls see see phone encounter with Shaune Pascal, [place].

## 2018-04-26 NOTE — Telephone Encounter (Signed)
No worries.  I don't usually read other phone encounter either, but I just happened to see that your description was "home health" right after I received a call from daughter asking our office for referral as well.  Just wanted to get everyone on the same page.

## 2018-04-26 NOTE — Telephone Encounter (Signed)
I apologize I didn't read the result note on the MyChart. Patient's daughter called and reported someone from your office wanted Dr Madilyn Fireman to order [facility] and OT for patient. She was concerned he would receive more benefit from [facility].   Thank you for letting us know. I am so glad you reached out to [place]. I was just going by what the daughter was saying.

## 2018-04-26 NOTE — Telephone Encounter (Signed)
OK.  Is PT going to go out?

## 2018-04-26 NOTE — Telephone Encounter (Signed)
I am not sure why pt's daughter believes he may have had another stroke.  I sent message via [employer] on 04/23/2018 advising that pt's MRI showed no evidence of new stroke.  Dr. Delice Lesch did not ask daughter to have Dr. Madilyn Fireman to place orders for PT - if Dr. Delice Lesch wishes for order to be placed, I can do that.

## 2018-04-27 ENCOUNTER — Other Ambulatory Visit: Payer: Self-pay

## 2018-04-27 DIAGNOSIS — R29898 Other symptoms and signs involving the musculoskeletal system: Secondary | ICD-10-CM

## 2018-04-27 NOTE — Telephone Encounter (Signed)
Spoke with pt's daughter, Butch Penny, who states that there was about a week, right around the time pt had his MRI done, where he accidentally took double his medications.  (83 y/o granddaughter did his medication box, but she put ALL medications in AM/PM - thinking that box was for 14days, NOT 7 days with AM/PM)  Also during that time pt began drinking heavy liquor again.  States that pt and his son-in-law would sneak around and drink during the day.  Butch Penny asks that referral be placed for [facility] PT.  Referral placed in system - community message sent to [employer].

## 2018-04-27 NOTE — Telephone Encounter (Signed)
Looks like daughter has not seen MyChart message, pls call to let her know there was no new stroke on the MRI. At this point we proceed with PT and OT, please clarify if they wanted both for Home PT and OT (note says PT outpatient and home health in home therapy, he can only have one or the other). Thanks!

## 2018-05-02 ENCOUNTER — Telehealth: Payer: Self-pay | Admitting: Neurology

## 2018-05-02 DIAGNOSIS — E119 Type 2 diabetes mellitus without complications: Secondary | ICD-10-CM | POA: Diagnosis not present

## 2018-05-02 DIAGNOSIS — I10 Essential (primary) hypertension: Secondary | ICD-10-CM | POA: Diagnosis not present

## 2018-05-02 DIAGNOSIS — I69354 Hemiplegia and hemiparesis following cerebral infarction affecting left non-dominant side: Secondary | ICD-10-CM | POA: Diagnosis not present

## 2018-05-02 DIAGNOSIS — M109 Gout, unspecified: Secondary | ICD-10-CM | POA: Diagnosis not present

## 2018-05-02 DIAGNOSIS — T464X5D Adverse effect of angiotensin-converting-enzyme inhibitors, subsequent encounter: Secondary | ICD-10-CM | POA: Diagnosis not present

## 2018-05-02 DIAGNOSIS — I952 Hypotension due to drugs: Secondary | ICD-10-CM | POA: Diagnosis not present

## 2018-05-02 DIAGNOSIS — Z794 Long term (current) use of insulin: Secondary | ICD-10-CM | POA: Diagnosis not present

## 2018-05-02 DIAGNOSIS — Z7902 Long term (current) use of antithrombotics/antiplatelets: Secondary | ICD-10-CM | POA: Diagnosis not present

## 2018-05-02 NOTE — Telephone Encounter (Signed)
Pete left vm from [facility] that he wants to continue PT on this patient as well as add OT. Please call him back at [phone]. Thanks!

## 2018-05-04 NOTE — Telephone Encounter (Signed)
Yes he is receiving Home Health PT.

## 2018-05-04 NOTE — Telephone Encounter (Signed)
Returned call to [place].  Verbal given to continue PT as well as to add OT.

## 2018-05-07 DIAGNOSIS — E119 Type 2 diabetes mellitus without complications: Secondary | ICD-10-CM | POA: Diagnosis not present

## 2018-05-07 DIAGNOSIS — Z794 Long term (current) use of insulin: Secondary | ICD-10-CM | POA: Diagnosis not present

## 2018-05-07 DIAGNOSIS — M109 Gout, unspecified: Secondary | ICD-10-CM | POA: Diagnosis not present

## 2018-05-07 DIAGNOSIS — I952 Hypotension due to drugs: Secondary | ICD-10-CM | POA: Diagnosis not present

## 2018-05-07 DIAGNOSIS — I69354 Hemiplegia and hemiparesis following cerebral infarction affecting left non-dominant side: Secondary | ICD-10-CM | POA: Diagnosis not present

## 2018-05-07 DIAGNOSIS — T464X5D Adverse effect of angiotensin-converting-enzyme inhibitors, subsequent encounter: Secondary | ICD-10-CM | POA: Diagnosis not present

## 2018-05-08 DIAGNOSIS — I952 Hypotension due to drugs: Secondary | ICD-10-CM | POA: Diagnosis not present

## 2018-05-08 DIAGNOSIS — E119 Type 2 diabetes mellitus without complications: Secondary | ICD-10-CM | POA: Diagnosis not present

## 2018-05-08 DIAGNOSIS — T464X5D Adverse effect of angiotensin-converting-enzyme inhibitors, subsequent encounter: Secondary | ICD-10-CM | POA: Diagnosis not present

## 2018-05-08 DIAGNOSIS — Z794 Long term (current) use of insulin: Secondary | ICD-10-CM | POA: Diagnosis not present

## 2018-05-08 DIAGNOSIS — I69354 Hemiplegia and hemiparesis following cerebral infarction affecting left non-dominant side: Secondary | ICD-10-CM | POA: Diagnosis not present

## 2018-05-08 DIAGNOSIS — M109 Gout, unspecified: Secondary | ICD-10-CM | POA: Diagnosis not present

## 2018-05-10 DIAGNOSIS — Z794 Long term (current) use of insulin: Secondary | ICD-10-CM | POA: Diagnosis not present

## 2018-05-10 DIAGNOSIS — E119 Type 2 diabetes mellitus without complications: Secondary | ICD-10-CM | POA: Diagnosis not present

## 2018-05-10 DIAGNOSIS — I952 Hypotension due to drugs: Secondary | ICD-10-CM | POA: Diagnosis not present

## 2018-05-10 DIAGNOSIS — T464X5D Adverse effect of angiotensin-converting-enzyme inhibitors, subsequent encounter: Secondary | ICD-10-CM | POA: Diagnosis not present

## 2018-05-10 DIAGNOSIS — I69354 Hemiplegia and hemiparesis following cerebral infarction affecting left non-dominant side: Secondary | ICD-10-CM | POA: Diagnosis not present

## 2018-05-10 DIAGNOSIS — M109 Gout, unspecified: Secondary | ICD-10-CM | POA: Diagnosis not present

## 2018-05-11 DIAGNOSIS — M109 Gout, unspecified: Secondary | ICD-10-CM | POA: Diagnosis not present

## 2018-05-11 DIAGNOSIS — I69354 Hemiplegia and hemiparesis following cerebral infarction affecting left non-dominant side: Secondary | ICD-10-CM | POA: Diagnosis not present

## 2018-05-11 DIAGNOSIS — Z794 Long term (current) use of insulin: Secondary | ICD-10-CM | POA: Diagnosis not present

## 2018-05-11 DIAGNOSIS — T464X5D Adverse effect of angiotensin-converting-enzyme inhibitors, subsequent encounter: Secondary | ICD-10-CM | POA: Diagnosis not present

## 2018-05-11 DIAGNOSIS — I952 Hypotension due to drugs: Secondary | ICD-10-CM | POA: Diagnosis not present

## 2018-05-11 DIAGNOSIS — E119 Type 2 diabetes mellitus without complications: Secondary | ICD-10-CM | POA: Diagnosis not present

## 2018-05-15 DIAGNOSIS — I952 Hypotension due to drugs: Secondary | ICD-10-CM | POA: Diagnosis not present

## 2018-05-15 DIAGNOSIS — M109 Gout, unspecified: Secondary | ICD-10-CM | POA: Diagnosis not present

## 2018-05-15 DIAGNOSIS — T464X5D Adverse effect of angiotensin-converting-enzyme inhibitors, subsequent encounter: Secondary | ICD-10-CM | POA: Diagnosis not present

## 2018-05-15 DIAGNOSIS — Z794 Long term (current) use of insulin: Secondary | ICD-10-CM | POA: Diagnosis not present

## 2018-05-15 DIAGNOSIS — I69354 Hemiplegia and hemiparesis following cerebral infarction affecting left non-dominant side: Secondary | ICD-10-CM | POA: Diagnosis not present

## 2018-05-15 DIAGNOSIS — E119 Type 2 diabetes mellitus without complications: Secondary | ICD-10-CM | POA: Diagnosis not present

## 2018-05-16 DIAGNOSIS — M109 Gout, unspecified: Secondary | ICD-10-CM | POA: Diagnosis not present

## 2018-05-16 DIAGNOSIS — I69354 Hemiplegia and hemiparesis following cerebral infarction affecting left non-dominant side: Secondary | ICD-10-CM | POA: Diagnosis not present

## 2018-05-16 DIAGNOSIS — E119 Type 2 diabetes mellitus without complications: Secondary | ICD-10-CM | POA: Diagnosis not present

## 2018-05-16 DIAGNOSIS — T464X5D Adverse effect of angiotensin-converting-enzyme inhibitors, subsequent encounter: Secondary | ICD-10-CM | POA: Diagnosis not present

## 2018-05-16 DIAGNOSIS — Z794 Long term (current) use of insulin: Secondary | ICD-10-CM | POA: Diagnosis not present

## 2018-05-16 DIAGNOSIS — I952 Hypotension due to drugs: Secondary | ICD-10-CM | POA: Diagnosis not present

## 2018-05-17 DIAGNOSIS — I69354 Hemiplegia and hemiparesis following cerebral infarction affecting left non-dominant side: Secondary | ICD-10-CM | POA: Diagnosis not present

## 2018-05-17 DIAGNOSIS — M109 Gout, unspecified: Secondary | ICD-10-CM | POA: Diagnosis not present

## 2018-05-17 DIAGNOSIS — E119 Type 2 diabetes mellitus without complications: Secondary | ICD-10-CM | POA: Diagnosis not present

## 2018-05-17 DIAGNOSIS — I952 Hypotension due to drugs: Secondary | ICD-10-CM | POA: Diagnosis not present

## 2018-05-17 DIAGNOSIS — Z794 Long term (current) use of insulin: Secondary | ICD-10-CM | POA: Diagnosis not present

## 2018-05-17 DIAGNOSIS — T464X5D Adverse effect of angiotensin-converting-enzyme inhibitors, subsequent encounter: Secondary | ICD-10-CM | POA: Diagnosis not present

## 2018-05-18 DIAGNOSIS — Z794 Long term (current) use of insulin: Secondary | ICD-10-CM | POA: Diagnosis not present

## 2018-05-18 DIAGNOSIS — E119 Type 2 diabetes mellitus without complications: Secondary | ICD-10-CM | POA: Diagnosis not present

## 2018-05-18 DIAGNOSIS — I952 Hypotension due to drugs: Secondary | ICD-10-CM | POA: Diagnosis not present

## 2018-05-18 DIAGNOSIS — T464X5D Adverse effect of angiotensin-converting-enzyme inhibitors, subsequent encounter: Secondary | ICD-10-CM | POA: Diagnosis not present

## 2018-05-18 DIAGNOSIS — I69354 Hemiplegia and hemiparesis following cerebral infarction affecting left non-dominant side: Secondary | ICD-10-CM | POA: Diagnosis not present

## 2018-05-18 DIAGNOSIS — M109 Gout, unspecified: Secondary | ICD-10-CM | POA: Diagnosis not present

## 2018-05-21 DIAGNOSIS — I69354 Hemiplegia and hemiparesis following cerebral infarction affecting left non-dominant side: Secondary | ICD-10-CM | POA: Diagnosis not present

## 2018-05-21 DIAGNOSIS — T464X5D Adverse effect of angiotensin-converting-enzyme inhibitors, subsequent encounter: Secondary | ICD-10-CM | POA: Diagnosis not present

## 2018-05-21 DIAGNOSIS — M109 Gout, unspecified: Secondary | ICD-10-CM | POA: Diagnosis not present

## 2018-05-21 DIAGNOSIS — I952 Hypotension due to drugs: Secondary | ICD-10-CM | POA: Diagnosis not present

## 2018-05-21 DIAGNOSIS — Z794 Long term (current) use of insulin: Secondary | ICD-10-CM | POA: Diagnosis not present

## 2018-05-21 DIAGNOSIS — E119 Type 2 diabetes mellitus without complications: Secondary | ICD-10-CM | POA: Diagnosis not present

## 2018-05-22 DIAGNOSIS — I69354 Hemiplegia and hemiparesis following cerebral infarction affecting left non-dominant side: Secondary | ICD-10-CM | POA: Diagnosis not present

## 2018-05-22 DIAGNOSIS — Z794 Long term (current) use of insulin: Secondary | ICD-10-CM | POA: Diagnosis not present

## 2018-05-22 DIAGNOSIS — M109 Gout, unspecified: Secondary | ICD-10-CM | POA: Diagnosis not present

## 2018-05-22 DIAGNOSIS — I952 Hypotension due to drugs: Secondary | ICD-10-CM | POA: Diagnosis not present

## 2018-05-22 DIAGNOSIS — E119 Type 2 diabetes mellitus without complications: Secondary | ICD-10-CM | POA: Diagnosis not present

## 2018-05-22 DIAGNOSIS — T464X5D Adverse effect of angiotensin-converting-enzyme inhibitors, subsequent encounter: Secondary | ICD-10-CM | POA: Diagnosis not present

## 2018-05-24 ENCOUNTER — Encounter: Payer: Medicare Other | Attending: Physical Medicine & Rehabilitation

## 2018-05-24 ENCOUNTER — Ambulatory Visit: Payer: Self-pay | Admitting: Physical Medicine & Rehabilitation

## 2018-05-24 DIAGNOSIS — I69354 Hemiplegia and hemiparesis following cerebral infarction affecting left non-dominant side: Secondary | ICD-10-CM | POA: Insufficient documentation

## 2018-05-24 DIAGNOSIS — I110 Hypertensive heart disease with heart failure: Secondary | ICD-10-CM | POA: Insufficient documentation

## 2018-05-24 DIAGNOSIS — G40909 Epilepsy, unspecified, not intractable, without status epilepticus: Secondary | ICD-10-CM | POA: Insufficient documentation

## 2018-05-24 DIAGNOSIS — Z87891 Personal history of nicotine dependence: Secondary | ICD-10-CM | POA: Insufficient documentation

## 2018-05-24 DIAGNOSIS — Z79899 Other long term (current) drug therapy: Secondary | ICD-10-CM | POA: Insufficient documentation

## 2018-05-24 DIAGNOSIS — Z951 Presence of aortocoronary bypass graft: Secondary | ICD-10-CM | POA: Insufficient documentation

## 2018-05-24 DIAGNOSIS — I6523 Occlusion and stenosis of bilateral carotid arteries: Secondary | ICD-10-CM | POA: Insufficient documentation

## 2018-05-24 DIAGNOSIS — I5032 Chronic diastolic (congestive) heart failure: Secondary | ICD-10-CM | POA: Insufficient documentation

## 2018-05-24 DIAGNOSIS — E1151 Type 2 diabetes mellitus with diabetic peripheral angiopathy without gangrene: Secondary | ICD-10-CM | POA: Insufficient documentation

## 2018-05-24 DIAGNOSIS — E785 Hyperlipidemia, unspecified: Secondary | ICD-10-CM | POA: Insufficient documentation

## 2018-05-24 DIAGNOSIS — I2510 Atherosclerotic heart disease of native coronary artery without angina pectoris: Secondary | ICD-10-CM | POA: Insufficient documentation

## 2018-05-24 DIAGNOSIS — E1142 Type 2 diabetes mellitus with diabetic polyneuropathy: Secondary | ICD-10-CM | POA: Insufficient documentation

## 2018-05-24 DIAGNOSIS — Z794 Long term (current) use of insulin: Secondary | ICD-10-CM | POA: Insufficient documentation

## 2018-05-24 DIAGNOSIS — Z7982 Long term (current) use of aspirin: Secondary | ICD-10-CM | POA: Insufficient documentation

## 2018-05-29 DIAGNOSIS — M109 Gout, unspecified: Secondary | ICD-10-CM | POA: Diagnosis not present

## 2018-05-29 DIAGNOSIS — Z794 Long term (current) use of insulin: Secondary | ICD-10-CM | POA: Diagnosis not present

## 2018-05-29 DIAGNOSIS — E119 Type 2 diabetes mellitus without complications: Secondary | ICD-10-CM | POA: Diagnosis not present

## 2018-05-29 DIAGNOSIS — I69354 Hemiplegia and hemiparesis following cerebral infarction affecting left non-dominant side: Secondary | ICD-10-CM | POA: Diagnosis not present

## 2018-05-29 DIAGNOSIS — I952 Hypotension due to drugs: Secondary | ICD-10-CM | POA: Diagnosis not present

## 2018-05-29 DIAGNOSIS — T464X5D Adverse effect of angiotensin-converting-enzyme inhibitors, subsequent encounter: Secondary | ICD-10-CM | POA: Diagnosis not present

## 2018-06-01 ENCOUNTER — Encounter: Payer: Self-pay | Admitting: Family Medicine

## 2018-06-01 ENCOUNTER — Ambulatory Visit (INDEPENDENT_AMBULATORY_CARE_PROVIDER_SITE_OTHER): Payer: Medicare Other | Admitting: Family Medicine

## 2018-06-01 VITALS — BP 127/62 | Wt 165.0 lb

## 2018-06-01 DIAGNOSIS — I10 Essential (primary) hypertension: Secondary | ICD-10-CM | POA: Diagnosis not present

## 2018-06-01 DIAGNOSIS — I509 Heart failure, unspecified: Secondary | ICD-10-CM | POA: Diagnosis not present

## 2018-06-01 DIAGNOSIS — Z8673 Personal history of transient ischemic attack (TIA), and cerebral infarction without residual deficits: Secondary | ICD-10-CM

## 2018-06-01 DIAGNOSIS — I5032 Chronic diastolic (congestive) heart failure: Secondary | ICD-10-CM

## 2018-06-01 DIAGNOSIS — Z794 Long term (current) use of insulin: Secondary | ICD-10-CM | POA: Diagnosis not present

## 2018-06-01 DIAGNOSIS — R82998 Other abnormal findings in urine: Secondary | ICD-10-CM | POA: Diagnosis not present

## 2018-06-01 DIAGNOSIS — E118 Type 2 diabetes mellitus with unspecified complications: Secondary | ICD-10-CM

## 2018-06-01 NOTE — Assessment & Plan Note (Signed)
Well controlled. Continue current regimen. Follow up in  3-4 months.  

## 2018-06-01 NOTE — Assessment & Plan Note (Signed)
Weight is stable on home scale.  Continue with dosing 3 days/week of furosemide.  Labs are up-to-date.

## 2018-06-01 NOTE — Assessment & Plan Note (Signed)
Well controlled. Continue current regimen. Follow up in  3-4 months. Home sugars have been well controlled.

## 2018-06-01 NOTE — Progress Notes (Signed)
Established Patient Office Visit  Subjective:  Patient ID: Fred [name], male    DOB: 12-Jan-1936  Age: 83 y.o. MRN: [id number]  CC: No chief complaint on file.   HPI Fred [name] presents for f/u several medical problems. Visit was performed telephonically. Pt consented to telephones visit.  Patient was at home Provider in the office at [facility].    F/u heart failure.  Denies any chest pain or shortness of breath.  No change or increase in swelling.  Taking Lasix 3 days per week.  Does like this is working well to control any swelling.  Weighing himself daily.   Diabetes - no hypoglycemic events. No wounds or sores that are not healing well. No increased thirst or urination. Checking glucose at home. Taking medications as prescribed without any side effects. CBG: 80.    Hyperlipidemia - back up to the whole tab.    [place] urine is better.     Past Medical History:  Diagnosis Date  . Atrial fibrillation ([place])    Post operative  . CAD (coronary artery disease)    s/p cabg  . CHF (congestive heart failure) ([place])   . Diverticulosis   . DJD (degenerative joint disease)   . DM (diabetes mellitus) ([place])   . History of anemia   . History of stroke   . Hyperlipidemia   . Hypertension   . 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 Peripheral vascular disease ([place])   . Polyneuropathy, diabetic ([place])   . Seizure disorder, complex partial ([place])   . Syncope and collapse     Past Surgical History:  Procedure Laterality Date  . CATARACT EXTRACTION W/ INTRAOCULAR LENS  IMPLANT, BILATERAL  06/2013   Dr. [name]  . CORONARY ARTERY BYPASS GRAFT  02/13/2004   4 vesel  . removal of sebaceous cyst from neck  08/2007   Dr. [name]  . TONSILLECTOMY      Family History  Problem Relation Age of Onset  . Epilepsy Father   . Stroke Mother   . Lung cancer Other     Social History   Socioeconomic History  . Marital status: Married    Spouse name:  Laterrance Nauta  . Number of children: 3  . Years of education: Not on file  . Highest education level: Not on file  Occupational History  . 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 Financial resource strain: Not on file  . Food insecurity:    Worry: Not on file    Inability: Not on file  . Transportation needs:    Medical: Not on file    Non-medical: Not on file  Tobacco Use  . Smoking status: Former Smoker    Packs/day: 1.00    Years: 22.00    Pack years: 22.00    Types: Cigarettes    Last attempt to quit: 03/14/1977    Years since quitting: 41.2  . Smokeless tobacco: Never Used  Substance and Sexual Activity  . Alcohol use: Yes    Comment: Pt has not had alcohol since 03/2017 CVA  . Drug use: No  . Sexual activity: Never  Lifestyle  . Physical activity:    Days per week: Not on file    Minutes per session: Not on file  . Stress: Not on file  Relationships  . Social connections:    Talks on phone: Not on file  Gets together: Not on file    Attends religious service: Not on file    Active member of club or organization: Not on file    Attends meetings of clubs or organizations: Not on file    Relationship status: Not on file  . Intimate partner violence:    Fear of current or ex partner: Not on file    Emotionally abused: Not on file    Physically abused: Not on file    Forced sexual activity: Not on file  Other Topics Concern  . Not on file  Social History Narrative   3 caffeinated drinks per day. Mostly coffee. He walks 15 minutes 3 days per week. Quit smoking in 1978.      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     Outpatient Medications Prior to Visit  Medication Sig Dispense Refill  . acetaminophen (TYLENOL) 325 MG tablet Take 2 tablets (650 mg total) by mouth every 4 (four) hours as needed for mild pain (or  temp > 37.5 C (99.5 F)).    Marland Kitchen allopurinol (ZYLOPRIM) 300 MG tablet TAKE 1 TABLET BY MOUTH DAILY 90 tablet 1  . atorvastatin (LIPITOR) 80 MG tablet TAKE 1 TABLET BY MOUTH DAILY 90 tablet 3  . blood glucose meter kit and supplies Check blood sugar 3 times daily. 1 each 0  . clopidogrel (PLAVIX) 75 MG tablet Take 1 tablet (75 mg total) by mouth daily. 90 tablet 3  . Coenzyme Q10 (COQ10) 30 MG CAPS Take 1 tablet by mouth daily.      . diclofenac sodium (VOLTAREN) 1 % GEL Apply 2 g topically 4 (four) times daily. 400 g 12  . divalproex (DEPAKOTE ER) 500 MG 24 hr tablet Take 1 tablet (500 mg total) by mouth daily. 90 tablet 3  . ezetimibe (ZETIA) 10 MG tablet Take 1 tablet (10 mg total) by mouth daily. 90 tablet 3  . furosemide (LASIX) 20 MG tablet TAKE 1 TABLET BY MOUTH DAILY (Patient taking differently: Pt taking M,W,F) 90 tablet 1  . Insulin Degludec (TRESIBA) 100 UNIT/ML SOLN Inject 10-20 Units into the skin daily. AT 10 PM 15 mL 11  . Insulin Pen Needle (NOVOFINE) 32G X 6 MM MISC Inject insulin daily 100 each prn  . Lancets (ONETOUCH DELICA PLUS [id number]) MISC     . losartan (COZAAR) 25 MG tablet Take 1 tablet (25 mg total) by mouth daily. 90 tablet 1  . metFORMIN (GLUCOPHAGE) 500 MG tablet TAKE 1 TABLET BY MOUTH TWICE DAILY WITH A MEAL 180 tablet 1  . metoprolol succinate (TOPROL-XL) 50 MG 24 hr tablet Take 1 tablet (50 mg total) by mouth daily. Take with or immediately following a meal. 90 tablet 1  . niacin (NIASPAN) 500 MG CR tablet Take 1 tablet (500 mg total) by mouth at bedtime. 90 tablet 3  . ONE TOUCH ULTRA TEST test strip     . senna-docusate (SENOKOT-S) 8.6-50 MG tablet Take 1 tablet by mouth 2 (two) times daily. (Patient taking differently: Take 1 tablet by mouth as needed. )    . TRADJENTA 5 MG TABS tablet TAKE 1 TABLET BY MOUTH DAILY 90 tablet 1   No facility-administered medications prior to visit.     Allergies  Allergen Reactions  . Lisinopril Cough    ROS Review of  Systems    Objective:    Physical Exam  BP 127/62   Wt 165 lb (74.8 kg) Comment: taken at home  BMI 29.23 kg/m  Wt Readings from Last 3 Encounters:  06/01/18 165 lb (74.8 kg)  03/28/18 166 lb (75.3 kg)  03/02/18 163 lb (73.9 kg)   No physical exam performed today as telephonic call.  Health Maintenance Due  Topic Date Due  . FOOT EXAM  05/11/2018    There are no preventive care reminders to display for this patient.  Lab Results  Component Value Date   TSH 0.99 03/28/2018   Lab Results  Component Value Date   WBC 15.7 (H) 03/28/2018   HGB 13.4 03/28/2018   HCT 39.1 03/28/2018   MCV 94.9 03/28/2018   PLT 410 (H) 03/28/2018   Lab Results  Component Value Date   NA 142 03/28/2018   K 3.3 (L) 03/28/2018   CO2 31 03/28/2018   GLUCOSE 204 (H) 03/28/2018   BUN 14 03/28/2018   CREATININE 1.19 (H) 03/28/2018   BILITOT 0.4 03/28/2018   ALKPHOS 39 08/14/2017   AST 11 03/28/2018   ALT 10 03/28/2018   PROT 6.4 03/28/2018   ALBUMIN 3.2 (L) 08/14/2017   CALCIUM 9.0 03/28/2018   ANIONGAP 6 09/11/2017   GFR 84.99 05/16/2013   Lab Results  Component Value Date   CHOL 195 08/10/2017   Lab Results  Component Value Date   HDL 40 (L) 08/10/2017   Lab Results  Component Value Date   LDLCALC 130 (H) 08/10/2017   Lab Results  Component Value Date   TRIG 125 08/10/2017   Lab Results  Component Value Date   CHOLHDL 4.9 08/10/2017   Lab Results  Component Value Date   HGBA1C 7.4 (A) 03/02/2018      Assessment & Plan:   Problem List Items Addressed This Visit      Cardiovascular and Mediastinum   Essential hypertension    Well controlled. Continue current regimen. Follow up in  3-4 months.        Chronic diastolic (congestive) heart failure (HCC)   CHF, chronic (HCC)    Weight is stable on home scale.  Continue with dosing 3 days/week of furosemide.  Labs are up-to-date.        Endocrine   Type 2 diabetes mellitus with complication, with long-term  current use of insulin ([place]) - Primary    Well controlled. Continue current regimen. Follow up in  3-4 months. Home sugars have been well controlled.         Other Visit Diagnoses    Dark urine       History of CVA (cerebrovascular accident)         Dark urine - has resolved.    No orders of the defined types were placed in this encounter.    Time spent on the phone 20 minutes.   Follow-up: No follow-ups on file.    Beatrice Lecher, MD

## 2018-06-15 ENCOUNTER — Other Ambulatory Visit: Payer: Self-pay | Admitting: Family Medicine

## 2018-07-30 ENCOUNTER — Other Ambulatory Visit: Payer: Self-pay | Admitting: Family Medicine

## 2018-07-30 DIAGNOSIS — Z794 Long term (current) use of insulin: Secondary | ICD-10-CM

## 2018-07-30 DIAGNOSIS — E118 Type 2 diabetes mellitus with unspecified complications: Secondary | ICD-10-CM

## 2018-07-30 MED ORDER — INSULIN DEGLUDEC 100 UNIT/ML ~~LOC~~ SOLN: 10.0000 [IU] | Freq: Every day | SUBCUTANEOUS | 11 refills | Status: DC

## 2018-07-30 MED ORDER — LINAGLIPTIN 5 MG PO TABS: 5.0000 mg | ORAL_TABLET | Freq: Every day | ORAL | 1 refills | Status: DC

## 2018-07-30 MED ORDER — ALLOPURINOL 300 MG PO TABS: 300.0000 mg | ORAL_TABLET | Freq: Every day | ORAL | 1 refills | Status: DC

## 2018-07-30 MED ORDER — METFORMIN HCL 500 MG PO TABS: ORAL_TABLET | ORAL | 1 refills | Status: DC

## 2018-07-30 MED ORDER — LOSARTAN POTASSIUM 25 MG PO TABS: 25.0000 mg | ORAL_TABLET | Freq: Every day | ORAL | 1 refills | Status: DC

## 2018-07-30 MED ORDER — FUROSEMIDE 20 MG PO TABS: 20.0000 mg | ORAL_TABLET | Freq: Every day | ORAL | 1 refills | Status: DC

## 2018-07-30 MED ORDER — METOPROLOL SUCCINATE ER 50 MG PO TB24: 50.0000 mg | ORAL_TABLET | Freq: Every day | ORAL | 1 refills | Status: DC

## 2018-07-30 MED ORDER — ATORVASTATIN CALCIUM 80 MG PO TABS: 80.0000 mg | ORAL_TABLET | Freq: Every day | ORAL | 3 refills | Status: DC

## 2018-08-01 ENCOUNTER — Other Ambulatory Visit: Payer: Self-pay

## 2018-08-01 ENCOUNTER — Telehealth (INDEPENDENT_AMBULATORY_CARE_PROVIDER_SITE_OTHER): Payer: Medicare Other | Admitting: Neurology

## 2018-08-01 ENCOUNTER — Telehealth: Payer: Self-pay | Admitting: Family Medicine

## 2018-08-01 VITALS — BP 126/42 | Temp 97.7°F

## 2018-08-01 DIAGNOSIS — Z8673 Personal history of transient ischemic attack (TIA), and cerebral infarction without residual deficits: Secondary | ICD-10-CM

## 2018-08-01 DIAGNOSIS — I639 Cerebral infarction, unspecified: Secondary | ICD-10-CM

## 2018-08-01 DIAGNOSIS — G40009 Localization-related (focal) (partial) idiopathic epilepsy and epileptic syndromes with seizures of localized onset, not intractable, without status epilepticus: Secondary | ICD-10-CM

## 2018-08-01 MED ORDER — DIVALPROEX SODIUM ER 500 MG PO TB24: 500.0000 mg | ORAL_TABLET | Freq: Every day | ORAL | 3 refills | Status: DC

## 2018-08-01 NOTE — Telephone Encounter (Signed)
Pt's daughter, Butch Penny, called to request home health aid for nighttime assistance. He is unable to preform ADL's and his wife suffers from [place]. Also requesting services for wife, so schedule with same company.

## 2018-08-01 NOTE — Progress Notes (Signed)
Virtual Visit via Video Note The purpose of this virtual visit is to provide medical care while limiting exposure to the novel coronavirus.    Consent was obtained for video visit:  Yes.   Answered questions that patient had about telehealth interaction:  Yes.   I discussed the limitations, risks, security and privacy concerns of performing an evaluation and management service by telemedicine. I also discussed with the patient that there may be a patient responsible charge related to this service. The patient expressed understanding and agreed to proceed.  Pt location: Home Physician Location: office Name of referring provider:  [name], * I connected with Fred [name] at patients initiation/request on 08/01/2018 at 11:00 AM EDT by video enabled telemedicine application and verified that I am speaking with the correct person using two identifiers. Pt MRN:  [id number] Pt DOB:  12-10-1935 Video Participants:  Fred [name];  [name] (daughter)   History of Present Illness:  The patient was last seen in January 2020. He has a history of recurrent strokes with subsequent seizures. On his last visit, family reported worsening left hand weakness. He had a stroke in May 2019 with residual left-sided weakness but still able to grip with his left hand. In December, he started noticing a change in left hand strength. I personally reviewed repeat MRI brain done 04/2018 which did not show any acute infarct, there was a small chronic infarct in the right posterior limb of the internal capsule with interval Wallerian degeneration extending into the right cerebral peduncle and hemi pons, advanced chronic microvascular disease, moderate diffuse volume loss. He did home OT and PT with no improvement in symptoms, unable to use left hand. He has not had Botox since 01/2018. He continues to do HEP. He denies any neck pain. They deny any episodes of confusion/gibberish speech since January  2019, no side effects on Depakote ER 540m qhs. No episodes of staring/unresponsiveness, myoclonic jerks.  History on Initial Assessment 06/07/2017: This is an 83year old right-handed man with a history of hypertension, diabetes, CAD s/p CABG, post-operative atrial fibrillation, PVD, prior stroke with subsequent seizures, presenting to establish care. He had previously been seeing neurologist Dr. XErlinda Hong records reviewed. He had a stroke in June 2016 with left-sided weakness and confusion. He did not receive IV-TPA due to rapidly resolving deficits. His MRI brain showed an acute small infarct in the right posterior frontal lobe and remote small thalamic infarcts. Stroke workup was unremarkable except for elevated HbA1c of 7.3 and EF 45-40%. Due to questionable seizure, he was discharged home on Depakote ER 5065mdaily. There is note of a confusional episode when he had a CABG done in 2015 with post-procedure atrial fibrillation, he had an EEG, results unavailable. On his follow-up with Dr. XuErlinda Hongn August 2016, they asked about continuing Depakote for seizure and it was discontinued. He was off Depakote on follow-up in November 2017. On his last visit with Dr. XuErlinda Hongn July 2018, it was noted that he was put back on Depakote by his PCP for seizure prevention in May 2018 (refills were sent and a referral to Neurology was done at that time). On 03/21/2017 he had an episode of garbled speech and confusion. They could not make out what he was saying, it was all gibberish. He was getting frustrated because they could not understand him. He was holding a can of soda in his hand and clenched on it so hard the soda spilled. He started making  words in 3-4 minutes. When EMS arrived, speech was still slurred, he was still confused and agreed to go to the hospital. He was brought to [facility] where he was noted to be aphasic with probable right-sided neglect and homonymous hemianopia. Family reports he said he was 83 years old and did not  know the president. Head CT and CTA were negative, due to concern for stroke he was given IV-TPA within the 4-1/2 hour window. His daughter reports that within 15-20 minutes after TPA, they saw a dramatic change. His MRI brain did not show any infarct. It was noted he had a stroke-like episode treated with IV-TPA with full recovery, possible complex partial seizure. He had an EEG which was mildly abnormal due to mild background slowing. It is unclear when Depakote was stopped, but family reports it was restarted at that time (Depakote level on admission was <10). He was discharged home on Depakote ER '500mg'$  qhs and has overall been doing better.  He does not recall the hospitalization in January 2019 and denies having any difficulties. His daughter also reports an episode in 2017 where he came out of the car looking straight ahead, stammering, mumbling but not saying real words. Then he just came out of it and was fine. They brought him to the hospital (no records on Epic) and was told it was probably a TIA and seizure. His daughter denies any other staring/unresponsive episodes. He denies any olfactory/gustatory hallucinations, deja vu, rising epigastric sensation, focal numbness/tingling/weakness, myoclonic jerks. He denies any headaches, dizziness, diplopia, dysarthria/dysphagia, neck/back pain, bowel/bladder dysfunction. Sleep is good. He has some knee pain and leg cramps.   Update 12/13/2017: Since his last visit, his daughter denies any further confusion episodes or gibberish speech, however he had another stroke last 08/09/17. Records were reviewed. He started having left-sided weakness and was brought to the ER, initially with 4+/5 strength. NIH of 1. Over the course of his stay, he had progressive worsening left hemiparesis and slurred speech. MRI brain showed right posterior limb of internal capsule stroke. 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 CTA head and neck showed <50 right ICA stenosis, 70% left ICA stenosis, LDL 130, HbA1c 6.0, normal echocardiogram. He had been taking aspirin '325mg'$  daily prior to this stroke, he was discharged home on Plavix and aspirin to take for 3 weeks, then has been back to aspirin alone. There has been some improvement in left sided weakness, he can now push with his left leg and slightly lift up his left arm. No numbness/tingling. He manages his own medications. He will be starting outpatient PT/OT tomorrow and looks forward to it.   Epilepsy Risk Factors:  His father had seizures. Prior stroke. Otherwise he had a normal birth and early development.  There is no history of febrile convulsions, CNS infections such as meningitis/encephalitis, significant traumatic brain injury, neurosurgical procedures.     Current Outpatient Medications on File Prior to Visit  Medication Sig Dispense Refill   acetaminophen (TYLENOL) 325 MG tablet Take 2 tablets (650 mg total) by mouth every 4 (four) hours as needed for mild pain (or temp > 37.5 C (99.5 F)).     allopurinol (ZYLOPRIM) 300 MG tablet Take 1 tablet (300 mg total) by mouth daily. 90 tablet 1   atorvastatin (LIPITOR) 80 MG tablet Take 1 tablet (80 mg total) by mouth daily. 90 tablet 3   blood glucose meter kit and supplies Check blood  sugar 3 times daily. 1 each 0   clopidogrel (PLAVIX) 75 MG tablet Take 1 tablet (75 mg total) by mouth daily. 90 tablet 3   Coenzyme Q10 (COQ10) 30 MG CAPS Take 1 tablet by mouth daily.       diclofenac sodium (VOLTAREN) 1 % GEL Apply 2 g topically 4 (four) times daily. 400 g 12   ezetimibe (ZETIA) 10 MG tablet Take 1 tablet (10 mg total) by mouth daily. 90 tablet 3   furosemide (LASIX) 20 MG tablet Take 1 tablet (20 mg total) by mouth daily. (Patient taking differently: Take 20 mg by mouth as needed. ) 90 tablet 1   Insulin Degludec (TRESIBA) 100 UNIT/ML SOLN Inject 10-20 Units into the skin daily. AT 10 PM  15 mL 11   Insulin Pen Needle (NOVOFINE) 32G X 6 MM MISC Inject insulin daily 100 each prn   Lancets (ONETOUCH DELICA PLUS [id number]) MISC      linagliptin (TRADJENTA) 5 MG TABS tablet Take 1 tablet (5 mg total) by mouth daily. 90 tablet 1   losartan (COZAAR) 25 MG tablet Take 1 tablet (25 mg total) by mouth daily. 90 tablet 1   metFORMIN (GLUCOPHAGE) 500 MG tablet TAKE 1 TABLET BY MOUTH TWICE DAILY WITH A MEAL 180 tablet 1   metoprolol succinate (TOPROL-XL) 50 MG 24 hr tablet Take 1 tablet (50 mg total) by mouth daily. Take with or immediately following a meal. 90 tablet 1   niacin (NIASPAN) 500 MG CR tablet Take 1 tablet (500 mg total) by mouth at bedtime. 90 tablet 3   ONE TOUCH ULTRA TEST test strip      senna-docusate (SENOKOT-S) 8.6-50 MG tablet Take 1 tablet by mouth 2 (two) times daily. (Patient taking differently: Take 1 tablet by mouth as needed. )     No current facility-administered medications on file prior to visit.      Observations/Objective:   Vitals:   08/01/18 1122  BP: (!) 126/42  Temp: 97.7 F (36.5 C)   GEN:  The patient appears stated age and is in NAD, sitting on wheelchair.  Neurological examination: Extraocular movements intact with no nystagmus. No facial asymmetry. Motor: at least antigravity on right UE and LE, he uses right hand to lift up left arm. Minimal movement of left leg.   IMPRESSION: This is an 83 yo RH man with a history of hypertension, diabetes, CAD s/p CABG, post-operative atrial fibrillation, PVD, seizures, and recurrent strokes. No further episodes of confusion with aphasia since January 2019 on Depakote ER 544m qhs, refills sent. He had a stroke in May 2019 with residual left-sided weakness, however his left hand became weaker around December 2019. Repeat MRI brain done February 2020 did not show any acute infarct, however there is a small chronic infarct in the right posterior limb of the internal capsule with interval Wallerian  degeneration extending into the right cerebral peduncle and hemi pons. It appears there has been extension of his stroke, he has severe small vessel disease. Continue Plavix, control of vascular risk factors, and avoidance of hypotension/hypoperfusion. Continue with HEP. They know to go to the ER immediately for any sudden change in symptoms. Follow-up in 6 months, they know to call for any changes.  Follow Up Instructions:   -I discussed the assessment and treatment plan with the patient. The patient was provided an opportunity to ask questions and all were answered. The patient agreed with the plan and demonstrated an understanding of the instructions.  The patient was advised to call back or seek an in-person evaluation if the symptoms worsen or if the condition fails to improve as anticipated.    Cameron Sprang, MD

## 2018-08-01 NOTE — Telephone Encounter (Signed)
rx signed

## 2018-08-07 ENCOUNTER — Ambulatory Visit: Payer: Medicare Other | Admitting: Neurology

## 2018-08-07 ENCOUNTER — Encounter

## 2018-08-10 DIAGNOSIS — E1142 Type 2 diabetes mellitus with diabetic polyneuropathy: Secondary | ICD-10-CM | POA: Diagnosis not present

## 2018-08-10 DIAGNOSIS — Z7902 Long term (current) use of antithrombotics/antiplatelets: Secondary | ICD-10-CM | POA: Diagnosis not present

## 2018-08-10 DIAGNOSIS — I2510 Atherosclerotic heart disease of native coronary artery without angina pectoris: Secondary | ICD-10-CM | POA: Diagnosis not present

## 2018-08-10 DIAGNOSIS — I69398 Other sequelae of cerebral infarction: Secondary | ICD-10-CM | POA: Diagnosis not present

## 2018-08-10 DIAGNOSIS — Z87891 Personal history of nicotine dependence: Secondary | ICD-10-CM | POA: Diagnosis not present

## 2018-08-10 DIAGNOSIS — I480 Paroxysmal atrial fibrillation: Secondary | ICD-10-CM | POA: Diagnosis not present

## 2018-08-10 DIAGNOSIS — I110 Hypertensive heart disease with heart failure: Secondary | ICD-10-CM | POA: Diagnosis not present

## 2018-08-10 DIAGNOSIS — M170 Bilateral primary osteoarthritis of knee: Secondary | ICD-10-CM | POA: Diagnosis not present

## 2018-08-10 DIAGNOSIS — I5032 Chronic diastolic (congestive) heart failure: Secondary | ICD-10-CM | POA: Diagnosis not present

## 2018-08-10 DIAGNOSIS — Z794 Long term (current) use of insulin: Secondary | ICD-10-CM | POA: Diagnosis not present

## 2018-08-10 DIAGNOSIS — G40009 Localization-related (focal) (partial) idiopathic epilepsy and epileptic syndromes with seizures of localized onset, not intractable, without status epilepticus: Secondary | ICD-10-CM | POA: Diagnosis not present

## 2018-08-10 DIAGNOSIS — E785 Hyperlipidemia, unspecified: Secondary | ICD-10-CM | POA: Diagnosis not present

## 2018-08-10 DIAGNOSIS — E1151 Type 2 diabetes mellitus with diabetic peripheral angiopathy without gangrene: Secondary | ICD-10-CM | POA: Diagnosis not present

## 2018-08-10 DIAGNOSIS — I69354 Hemiplegia and hemiparesis following cerebral infarction affecting left non-dominant side: Secondary | ICD-10-CM | POA: Diagnosis not present

## 2018-08-10 DIAGNOSIS — Z951 Presence of aortocoronary bypass graft: Secondary | ICD-10-CM | POA: Diagnosis not present

## 2018-08-10 DIAGNOSIS — K5790 Diverticulosis of intestine, part unspecified, without perforation or abscess without bleeding: Secondary | ICD-10-CM | POA: Diagnosis not present

## 2018-08-13 DIAGNOSIS — I110 Hypertensive heart disease with heart failure: Secondary | ICD-10-CM | POA: Diagnosis not present

## 2018-08-13 DIAGNOSIS — E1142 Type 2 diabetes mellitus with diabetic polyneuropathy: Secondary | ICD-10-CM | POA: Diagnosis not present

## 2018-08-13 DIAGNOSIS — I5032 Chronic diastolic (congestive) heart failure: Secondary | ICD-10-CM | POA: Diagnosis not present

## 2018-08-13 DIAGNOSIS — I69354 Hemiplegia and hemiparesis following cerebral infarction affecting left non-dominant side: Secondary | ICD-10-CM | POA: Diagnosis not present

## 2018-08-13 DIAGNOSIS — I2510 Atherosclerotic heart disease of native coronary artery without angina pectoris: Secondary | ICD-10-CM | POA: Diagnosis not present

## 2018-08-13 DIAGNOSIS — I480 Paroxysmal atrial fibrillation: Secondary | ICD-10-CM | POA: Diagnosis not present

## 2018-08-14 DIAGNOSIS — I5032 Chronic diastolic (congestive) heart failure: Secondary | ICD-10-CM | POA: Diagnosis not present

## 2018-08-14 DIAGNOSIS — I110 Hypertensive heart disease with heart failure: Secondary | ICD-10-CM | POA: Diagnosis not present

## 2018-08-14 DIAGNOSIS — I69354 Hemiplegia and hemiparesis following cerebral infarction affecting left non-dominant side: Secondary | ICD-10-CM | POA: Diagnosis not present

## 2018-08-14 DIAGNOSIS — I480 Paroxysmal atrial fibrillation: Secondary | ICD-10-CM | POA: Diagnosis not present

## 2018-08-14 DIAGNOSIS — E1142 Type 2 diabetes mellitus with diabetic polyneuropathy: Secondary | ICD-10-CM | POA: Diagnosis not present

## 2018-08-14 DIAGNOSIS — I2510 Atherosclerotic heart disease of native coronary artery without angina pectoris: Secondary | ICD-10-CM | POA: Diagnosis not present

## 2018-08-16 DIAGNOSIS — I69354 Hemiplegia and hemiparesis following cerebral infarction affecting left non-dominant side: Secondary | ICD-10-CM | POA: Diagnosis not present

## 2018-08-16 DIAGNOSIS — I480 Paroxysmal atrial fibrillation: Secondary | ICD-10-CM | POA: Diagnosis not present

## 2018-08-16 DIAGNOSIS — I2510 Atherosclerotic heart disease of native coronary artery without angina pectoris: Secondary | ICD-10-CM | POA: Diagnosis not present

## 2018-08-16 DIAGNOSIS — I110 Hypertensive heart disease with heart failure: Secondary | ICD-10-CM | POA: Diagnosis not present

## 2018-08-16 DIAGNOSIS — I5032 Chronic diastolic (congestive) heart failure: Secondary | ICD-10-CM | POA: Diagnosis not present

## 2018-08-16 DIAGNOSIS — E1142 Type 2 diabetes mellitus with diabetic polyneuropathy: Secondary | ICD-10-CM | POA: Diagnosis not present

## 2018-08-17 DIAGNOSIS — E1142 Type 2 diabetes mellitus with diabetic polyneuropathy: Secondary | ICD-10-CM | POA: Diagnosis not present

## 2018-08-17 DIAGNOSIS — I110 Hypertensive heart disease with heart failure: Secondary | ICD-10-CM | POA: Diagnosis not present

## 2018-08-17 DIAGNOSIS — I480 Paroxysmal atrial fibrillation: Secondary | ICD-10-CM | POA: Diagnosis not present

## 2018-08-17 DIAGNOSIS — I5032 Chronic diastolic (congestive) heart failure: Secondary | ICD-10-CM | POA: Diagnosis not present

## 2018-08-17 DIAGNOSIS — I2510 Atherosclerotic heart disease of native coronary artery without angina pectoris: Secondary | ICD-10-CM | POA: Diagnosis not present

## 2018-08-17 DIAGNOSIS — I69354 Hemiplegia and hemiparesis following cerebral infarction affecting left non-dominant side: Secondary | ICD-10-CM | POA: Diagnosis not present

## 2018-08-20 DIAGNOSIS — I2510 Atherosclerotic heart disease of native coronary artery without angina pectoris: Secondary | ICD-10-CM | POA: Diagnosis not present

## 2018-08-20 DIAGNOSIS — E1142 Type 2 diabetes mellitus with diabetic polyneuropathy: Secondary | ICD-10-CM | POA: Diagnosis not present

## 2018-08-20 DIAGNOSIS — I69354 Hemiplegia and hemiparesis following cerebral infarction affecting left non-dominant side: Secondary | ICD-10-CM | POA: Diagnosis not present

## 2018-08-20 DIAGNOSIS — I110 Hypertensive heart disease with heart failure: Secondary | ICD-10-CM | POA: Diagnosis not present

## 2018-08-20 DIAGNOSIS — I480 Paroxysmal atrial fibrillation: Secondary | ICD-10-CM | POA: Diagnosis not present

## 2018-08-20 DIAGNOSIS — I5032 Chronic diastolic (congestive) heart failure: Secondary | ICD-10-CM | POA: Diagnosis not present

## 2018-08-22 DIAGNOSIS — I110 Hypertensive heart disease with heart failure: Secondary | ICD-10-CM | POA: Diagnosis not present

## 2018-08-22 DIAGNOSIS — E1142 Type 2 diabetes mellitus with diabetic polyneuropathy: Secondary | ICD-10-CM | POA: Diagnosis not present

## 2018-08-22 DIAGNOSIS — I480 Paroxysmal atrial fibrillation: Secondary | ICD-10-CM | POA: Diagnosis not present

## 2018-08-22 DIAGNOSIS — I2510 Atherosclerotic heart disease of native coronary artery without angina pectoris: Secondary | ICD-10-CM | POA: Diagnosis not present

## 2018-08-22 DIAGNOSIS — I69354 Hemiplegia and hemiparesis following cerebral infarction affecting left non-dominant side: Secondary | ICD-10-CM | POA: Diagnosis not present

## 2018-08-22 DIAGNOSIS — I5032 Chronic diastolic (congestive) heart failure: Secondary | ICD-10-CM | POA: Diagnosis not present

## 2018-08-23 DIAGNOSIS — I110 Hypertensive heart disease with heart failure: Secondary | ICD-10-CM | POA: Diagnosis not present

## 2018-08-23 DIAGNOSIS — I69354 Hemiplegia and hemiparesis following cerebral infarction affecting left non-dominant side: Secondary | ICD-10-CM | POA: Diagnosis not present

## 2018-08-23 DIAGNOSIS — I480 Paroxysmal atrial fibrillation: Secondary | ICD-10-CM | POA: Diagnosis not present

## 2018-08-23 DIAGNOSIS — E1142 Type 2 diabetes mellitus with diabetic polyneuropathy: Secondary | ICD-10-CM | POA: Diagnosis not present

## 2018-08-23 DIAGNOSIS — I2510 Atherosclerotic heart disease of native coronary artery without angina pectoris: Secondary | ICD-10-CM | POA: Diagnosis not present

## 2018-08-23 DIAGNOSIS — I5032 Chronic diastolic (congestive) heart failure: Secondary | ICD-10-CM | POA: Diagnosis not present

## 2018-08-24 DIAGNOSIS — I5032 Chronic diastolic (congestive) heart failure: Secondary | ICD-10-CM | POA: Diagnosis not present

## 2018-08-24 DIAGNOSIS — I480 Paroxysmal atrial fibrillation: Secondary | ICD-10-CM | POA: Diagnosis not present

## 2018-08-24 DIAGNOSIS — I110 Hypertensive heart disease with heart failure: Secondary | ICD-10-CM | POA: Diagnosis not present

## 2018-08-24 DIAGNOSIS — E1142 Type 2 diabetes mellitus with diabetic polyneuropathy: Secondary | ICD-10-CM | POA: Diagnosis not present

## 2018-08-24 DIAGNOSIS — I69354 Hemiplegia and hemiparesis following cerebral infarction affecting left non-dominant side: Secondary | ICD-10-CM | POA: Diagnosis not present

## 2018-08-24 DIAGNOSIS — I2510 Atherosclerotic heart disease of native coronary artery without angina pectoris: Secondary | ICD-10-CM | POA: Diagnosis not present

## 2018-08-27 DIAGNOSIS — E1142 Type 2 diabetes mellitus with diabetic polyneuropathy: Secondary | ICD-10-CM | POA: Diagnosis not present

## 2018-08-27 DIAGNOSIS — I2510 Atherosclerotic heart disease of native coronary artery without angina pectoris: Secondary | ICD-10-CM | POA: Diagnosis not present

## 2018-08-27 DIAGNOSIS — I110 Hypertensive heart disease with heart failure: Secondary | ICD-10-CM | POA: Diagnosis not present

## 2018-08-27 DIAGNOSIS — I5032 Chronic diastolic (congestive) heart failure: Secondary | ICD-10-CM | POA: Diagnosis not present

## 2018-08-27 DIAGNOSIS — I480 Paroxysmal atrial fibrillation: Secondary | ICD-10-CM | POA: Diagnosis not present

## 2018-08-27 DIAGNOSIS — I69354 Hemiplegia and hemiparesis following cerebral infarction affecting left non-dominant side: Secondary | ICD-10-CM | POA: Diagnosis not present

## 2018-08-29 DIAGNOSIS — I69354 Hemiplegia and hemiparesis following cerebral infarction affecting left non-dominant side: Secondary | ICD-10-CM | POA: Diagnosis not present

## 2018-08-29 DIAGNOSIS — I110 Hypertensive heart disease with heart failure: Secondary | ICD-10-CM | POA: Diagnosis not present

## 2018-08-29 DIAGNOSIS — E1142 Type 2 diabetes mellitus with diabetic polyneuropathy: Secondary | ICD-10-CM | POA: Diagnosis not present

## 2018-08-29 DIAGNOSIS — I2510 Atherosclerotic heart disease of native coronary artery without angina pectoris: Secondary | ICD-10-CM | POA: Diagnosis not present

## 2018-08-29 DIAGNOSIS — I480 Paroxysmal atrial fibrillation: Secondary | ICD-10-CM | POA: Diagnosis not present

## 2018-08-29 DIAGNOSIS — I5032 Chronic diastolic (congestive) heart failure: Secondary | ICD-10-CM | POA: Diagnosis not present

## 2018-09-03 DIAGNOSIS — I480 Paroxysmal atrial fibrillation: Secondary | ICD-10-CM | POA: Diagnosis not present

## 2018-09-03 DIAGNOSIS — I2510 Atherosclerotic heart disease of native coronary artery without angina pectoris: Secondary | ICD-10-CM | POA: Diagnosis not present

## 2018-09-03 DIAGNOSIS — E1142 Type 2 diabetes mellitus with diabetic polyneuropathy: Secondary | ICD-10-CM | POA: Diagnosis not present

## 2018-09-03 DIAGNOSIS — I110 Hypertensive heart disease with heart failure: Secondary | ICD-10-CM | POA: Diagnosis not present

## 2018-09-03 DIAGNOSIS — I69354 Hemiplegia and hemiparesis following cerebral infarction affecting left non-dominant side: Secondary | ICD-10-CM | POA: Diagnosis not present

## 2018-09-03 DIAGNOSIS — I5032 Chronic diastolic (congestive) heart failure: Secondary | ICD-10-CM | POA: Diagnosis not present

## 2018-09-09 DIAGNOSIS — E1151 Type 2 diabetes mellitus with diabetic peripheral angiopathy without gangrene: Secondary | ICD-10-CM | POA: Diagnosis not present

## 2018-09-09 DIAGNOSIS — E1142 Type 2 diabetes mellitus with diabetic polyneuropathy: Secondary | ICD-10-CM | POA: Diagnosis not present

## 2018-09-09 DIAGNOSIS — I480 Paroxysmal atrial fibrillation: Secondary | ICD-10-CM | POA: Diagnosis not present

## 2018-09-09 DIAGNOSIS — Z951 Presence of aortocoronary bypass graft: Secondary | ICD-10-CM | POA: Diagnosis not present

## 2018-09-09 DIAGNOSIS — G40009 Localization-related (focal) (partial) idiopathic epilepsy and epileptic syndromes with seizures of localized onset, not intractable, without status epilepticus: Secondary | ICD-10-CM | POA: Diagnosis not present

## 2018-09-09 DIAGNOSIS — I69398 Other sequelae of cerebral infarction: Secondary | ICD-10-CM | POA: Diagnosis not present

## 2018-09-09 DIAGNOSIS — I2510 Atherosclerotic heart disease of native coronary artery without angina pectoris: Secondary | ICD-10-CM | POA: Diagnosis not present

## 2018-09-09 DIAGNOSIS — Z794 Long term (current) use of insulin: Secondary | ICD-10-CM | POA: Diagnosis not present

## 2018-09-09 DIAGNOSIS — E785 Hyperlipidemia, unspecified: Secondary | ICD-10-CM | POA: Diagnosis not present

## 2018-09-09 DIAGNOSIS — K5790 Diverticulosis of intestine, part unspecified, without perforation or abscess without bleeding: Secondary | ICD-10-CM | POA: Diagnosis not present

## 2018-09-09 DIAGNOSIS — I5032 Chronic diastolic (congestive) heart failure: Secondary | ICD-10-CM | POA: Diagnosis not present

## 2018-09-09 DIAGNOSIS — Z87891 Personal history of nicotine dependence: Secondary | ICD-10-CM | POA: Diagnosis not present

## 2018-09-09 DIAGNOSIS — Z7902 Long term (current) use of antithrombotics/antiplatelets: Secondary | ICD-10-CM | POA: Diagnosis not present

## 2018-09-09 DIAGNOSIS — I110 Hypertensive heart disease with heart failure: Secondary | ICD-10-CM | POA: Diagnosis not present

## 2018-09-09 DIAGNOSIS — I69354 Hemiplegia and hemiparesis following cerebral infarction affecting left non-dominant side: Secondary | ICD-10-CM | POA: Diagnosis not present

## 2018-09-09 DIAGNOSIS — M170 Bilateral primary osteoarthritis of knee: Secondary | ICD-10-CM | POA: Diagnosis not present

## 2018-09-10 DIAGNOSIS — I5032 Chronic diastolic (congestive) heart failure: Secondary | ICD-10-CM | POA: Diagnosis not present

## 2018-09-10 DIAGNOSIS — E1142 Type 2 diabetes mellitus with diabetic polyneuropathy: Secondary | ICD-10-CM | POA: Diagnosis not present

## 2018-09-10 DIAGNOSIS — I480 Paroxysmal atrial fibrillation: Secondary | ICD-10-CM | POA: Diagnosis not present

## 2018-09-10 DIAGNOSIS — I110 Hypertensive heart disease with heart failure: Secondary | ICD-10-CM | POA: Diagnosis not present

## 2018-09-10 DIAGNOSIS — I69354 Hemiplegia and hemiparesis following cerebral infarction affecting left non-dominant side: Secondary | ICD-10-CM | POA: Diagnosis not present

## 2018-09-10 DIAGNOSIS — I2510 Atherosclerotic heart disease of native coronary artery without angina pectoris: Secondary | ICD-10-CM | POA: Diagnosis not present

## 2018-09-12 DIAGNOSIS — I69354 Hemiplegia and hemiparesis following cerebral infarction affecting left non-dominant side: Secondary | ICD-10-CM | POA: Diagnosis not present

## 2018-09-12 DIAGNOSIS — I110 Hypertensive heart disease with heart failure: Secondary | ICD-10-CM | POA: Diagnosis not present

## 2018-09-12 DIAGNOSIS — E1142 Type 2 diabetes mellitus with diabetic polyneuropathy: Secondary | ICD-10-CM | POA: Diagnosis not present

## 2018-09-12 DIAGNOSIS — I5032 Chronic diastolic (congestive) heart failure: Secondary | ICD-10-CM | POA: Diagnosis not present

## 2018-09-12 DIAGNOSIS — I480 Paroxysmal atrial fibrillation: Secondary | ICD-10-CM | POA: Diagnosis not present

## 2018-09-12 DIAGNOSIS — I2510 Atherosclerotic heart disease of native coronary artery without angina pectoris: Secondary | ICD-10-CM | POA: Diagnosis not present

## 2018-09-17 DIAGNOSIS — I2510 Atherosclerotic heart disease of native coronary artery without angina pectoris: Secondary | ICD-10-CM | POA: Diagnosis not present

## 2018-09-17 DIAGNOSIS — I69354 Hemiplegia and hemiparesis following cerebral infarction affecting left non-dominant side: Secondary | ICD-10-CM | POA: Diagnosis not present

## 2018-09-17 DIAGNOSIS — I5032 Chronic diastolic (congestive) heart failure: Secondary | ICD-10-CM | POA: Diagnosis not present

## 2018-09-17 DIAGNOSIS — I480 Paroxysmal atrial fibrillation: Secondary | ICD-10-CM | POA: Diagnosis not present

## 2018-09-17 DIAGNOSIS — I110 Hypertensive heart disease with heart failure: Secondary | ICD-10-CM | POA: Diagnosis not present

## 2018-09-17 DIAGNOSIS — E1142 Type 2 diabetes mellitus with diabetic polyneuropathy: Secondary | ICD-10-CM | POA: Diagnosis not present

## 2018-09-18 DIAGNOSIS — I5032 Chronic diastolic (congestive) heart failure: Secondary | ICD-10-CM | POA: Diagnosis not present

## 2018-09-18 DIAGNOSIS — I110 Hypertensive heart disease with heart failure: Secondary | ICD-10-CM | POA: Diagnosis not present

## 2018-09-18 DIAGNOSIS — E1142 Type 2 diabetes mellitus with diabetic polyneuropathy: Secondary | ICD-10-CM | POA: Diagnosis not present

## 2018-09-18 DIAGNOSIS — I2510 Atherosclerotic heart disease of native coronary artery without angina pectoris: Secondary | ICD-10-CM | POA: Diagnosis not present

## 2018-09-18 DIAGNOSIS — I69354 Hemiplegia and hemiparesis following cerebral infarction affecting left non-dominant side: Secondary | ICD-10-CM | POA: Diagnosis not present

## 2018-09-18 DIAGNOSIS — I480 Paroxysmal atrial fibrillation: Secondary | ICD-10-CM | POA: Diagnosis not present

## 2018-09-19 DIAGNOSIS — E1142 Type 2 diabetes mellitus with diabetic polyneuropathy: Secondary | ICD-10-CM | POA: Diagnosis not present

## 2018-09-19 DIAGNOSIS — I5032 Chronic diastolic (congestive) heart failure: Secondary | ICD-10-CM | POA: Diagnosis not present

## 2018-09-19 DIAGNOSIS — I480 Paroxysmal atrial fibrillation: Secondary | ICD-10-CM | POA: Diagnosis not present

## 2018-09-19 DIAGNOSIS — I110 Hypertensive heart disease with heart failure: Secondary | ICD-10-CM | POA: Diagnosis not present

## 2018-09-19 DIAGNOSIS — I69354 Hemiplegia and hemiparesis following cerebral infarction affecting left non-dominant side: Secondary | ICD-10-CM | POA: Diagnosis not present

## 2018-09-19 DIAGNOSIS — I2510 Atherosclerotic heart disease of native coronary artery without angina pectoris: Secondary | ICD-10-CM | POA: Diagnosis not present

## 2018-09-24 DIAGNOSIS — I5032 Chronic diastolic (congestive) heart failure: Secondary | ICD-10-CM | POA: Diagnosis not present

## 2018-09-24 DIAGNOSIS — I480 Paroxysmal atrial fibrillation: Secondary | ICD-10-CM | POA: Diagnosis not present

## 2018-09-24 DIAGNOSIS — E1142 Type 2 diabetes mellitus with diabetic polyneuropathy: Secondary | ICD-10-CM | POA: Diagnosis not present

## 2018-09-24 DIAGNOSIS — I2510 Atherosclerotic heart disease of native coronary artery without angina pectoris: Secondary | ICD-10-CM | POA: Diagnosis not present

## 2018-09-24 DIAGNOSIS — I110 Hypertensive heart disease with heart failure: Secondary | ICD-10-CM | POA: Diagnosis not present

## 2018-09-24 DIAGNOSIS — I69354 Hemiplegia and hemiparesis following cerebral infarction affecting left non-dominant side: Secondary | ICD-10-CM | POA: Diagnosis not present

## 2018-10-01 DIAGNOSIS — I5032 Chronic diastolic (congestive) heart failure: Secondary | ICD-10-CM | POA: Diagnosis not present

## 2018-10-01 DIAGNOSIS — I2510 Atherosclerotic heart disease of native coronary artery without angina pectoris: Secondary | ICD-10-CM | POA: Diagnosis not present

## 2018-10-01 DIAGNOSIS — I110 Hypertensive heart disease with heart failure: Secondary | ICD-10-CM | POA: Diagnosis not present

## 2018-10-01 DIAGNOSIS — E1142 Type 2 diabetes mellitus with diabetic polyneuropathy: Secondary | ICD-10-CM | POA: Diagnosis not present

## 2018-10-01 DIAGNOSIS — I69354 Hemiplegia and hemiparesis following cerebral infarction affecting left non-dominant side: Secondary | ICD-10-CM | POA: Diagnosis not present

## 2018-10-01 DIAGNOSIS — I480 Paroxysmal atrial fibrillation: Secondary | ICD-10-CM | POA: Diagnosis not present

## 2018-10-07 DIAGNOSIS — I480 Paroxysmal atrial fibrillation: Secondary | ICD-10-CM | POA: Diagnosis not present

## 2018-10-07 DIAGNOSIS — I69354 Hemiplegia and hemiparesis following cerebral infarction affecting left non-dominant side: Secondary | ICD-10-CM | POA: Diagnosis not present

## 2018-10-07 DIAGNOSIS — I5032 Chronic diastolic (congestive) heart failure: Secondary | ICD-10-CM | POA: Diagnosis not present

## 2018-10-07 DIAGNOSIS — E1142 Type 2 diabetes mellitus with diabetic polyneuropathy: Secondary | ICD-10-CM | POA: Diagnosis not present

## 2018-10-07 DIAGNOSIS — I2510 Atherosclerotic heart disease of native coronary artery without angina pectoris: Secondary | ICD-10-CM | POA: Diagnosis not present

## 2018-10-07 DIAGNOSIS — I110 Hypertensive heart disease with heart failure: Secondary | ICD-10-CM | POA: Diagnosis not present

## 2018-10-08 DIAGNOSIS — I5032 Chronic diastolic (congestive) heart failure: Secondary | ICD-10-CM | POA: Diagnosis not present

## 2018-10-08 DIAGNOSIS — I69354 Hemiplegia and hemiparesis following cerebral infarction affecting left non-dominant side: Secondary | ICD-10-CM | POA: Diagnosis not present

## 2018-10-08 DIAGNOSIS — I110 Hypertensive heart disease with heart failure: Secondary | ICD-10-CM | POA: Diagnosis not present

## 2018-10-08 DIAGNOSIS — E1142 Type 2 diabetes mellitus with diabetic polyneuropathy: Secondary | ICD-10-CM | POA: Diagnosis not present

## 2018-10-08 DIAGNOSIS — I480 Paroxysmal atrial fibrillation: Secondary | ICD-10-CM | POA: Diagnosis not present

## 2018-10-08 DIAGNOSIS — I2510 Atherosclerotic heart disease of native coronary artery without angina pectoris: Secondary | ICD-10-CM | POA: Diagnosis not present

## 2018-10-09 DIAGNOSIS — Z794 Long term (current) use of insulin: Secondary | ICD-10-CM | POA: Diagnosis not present

## 2018-10-09 DIAGNOSIS — G40009 Localization-related (focal) (partial) idiopathic epilepsy and epileptic syndromes with seizures of localized onset, not intractable, without status epilepticus: Secondary | ICD-10-CM | POA: Diagnosis not present

## 2018-10-09 DIAGNOSIS — I5032 Chronic diastolic (congestive) heart failure: Secondary | ICD-10-CM | POA: Diagnosis not present

## 2018-10-09 DIAGNOSIS — E1142 Type 2 diabetes mellitus with diabetic polyneuropathy: Secondary | ICD-10-CM | POA: Diagnosis not present

## 2018-10-09 DIAGNOSIS — Z87891 Personal history of nicotine dependence: Secondary | ICD-10-CM | POA: Diagnosis not present

## 2018-10-09 DIAGNOSIS — E785 Hyperlipidemia, unspecified: Secondary | ICD-10-CM | POA: Diagnosis not present

## 2018-10-09 DIAGNOSIS — I69398 Other sequelae of cerebral infarction: Secondary | ICD-10-CM | POA: Diagnosis not present

## 2018-10-09 DIAGNOSIS — I2510 Atherosclerotic heart disease of native coronary artery without angina pectoris: Secondary | ICD-10-CM | POA: Diagnosis not present

## 2018-10-09 DIAGNOSIS — Z951 Presence of aortocoronary bypass graft: Secondary | ICD-10-CM | POA: Diagnosis not present

## 2018-10-09 DIAGNOSIS — I480 Paroxysmal atrial fibrillation: Secondary | ICD-10-CM | POA: Diagnosis not present

## 2018-10-09 DIAGNOSIS — K5790 Diverticulosis of intestine, part unspecified, without perforation or abscess without bleeding: Secondary | ICD-10-CM | POA: Diagnosis not present

## 2018-10-09 DIAGNOSIS — I69354 Hemiplegia and hemiparesis following cerebral infarction affecting left non-dominant side: Secondary | ICD-10-CM | POA: Diagnosis not present

## 2018-10-09 DIAGNOSIS — M170 Bilateral primary osteoarthritis of knee: Secondary | ICD-10-CM | POA: Diagnosis not present

## 2018-10-09 DIAGNOSIS — E1151 Type 2 diabetes mellitus with diabetic peripheral angiopathy without gangrene: Secondary | ICD-10-CM | POA: Diagnosis not present

## 2018-10-09 DIAGNOSIS — I110 Hypertensive heart disease with heart failure: Secondary | ICD-10-CM | POA: Diagnosis not present

## 2018-10-09 DIAGNOSIS — Z7902 Long term (current) use of antithrombotics/antiplatelets: Secondary | ICD-10-CM | POA: Diagnosis not present

## 2018-10-10 DIAGNOSIS — I480 Paroxysmal atrial fibrillation: Secondary | ICD-10-CM | POA: Diagnosis not present

## 2018-10-10 DIAGNOSIS — I69354 Hemiplegia and hemiparesis following cerebral infarction affecting left non-dominant side: Secondary | ICD-10-CM | POA: Diagnosis not present

## 2018-10-10 DIAGNOSIS — I5032 Chronic diastolic (congestive) heart failure: Secondary | ICD-10-CM | POA: Diagnosis not present

## 2018-10-10 DIAGNOSIS — I2510 Atherosclerotic heart disease of native coronary artery without angina pectoris: Secondary | ICD-10-CM | POA: Diagnosis not present

## 2018-10-10 DIAGNOSIS — E1142 Type 2 diabetes mellitus with diabetic polyneuropathy: Secondary | ICD-10-CM | POA: Diagnosis not present

## 2018-10-10 DIAGNOSIS — I110 Hypertensive heart disease with heart failure: Secondary | ICD-10-CM | POA: Diagnosis not present

## 2018-10-12 DIAGNOSIS — E1142 Type 2 diabetes mellitus with diabetic polyneuropathy: Secondary | ICD-10-CM | POA: Diagnosis not present

## 2018-10-12 DIAGNOSIS — I5032 Chronic diastolic (congestive) heart failure: Secondary | ICD-10-CM | POA: Diagnosis not present

## 2018-10-12 DIAGNOSIS — I69354 Hemiplegia and hemiparesis following cerebral infarction affecting left non-dominant side: Secondary | ICD-10-CM | POA: Diagnosis not present

## 2018-10-12 DIAGNOSIS — I480 Paroxysmal atrial fibrillation: Secondary | ICD-10-CM | POA: Diagnosis not present

## 2018-10-12 DIAGNOSIS — I110 Hypertensive heart disease with heart failure: Secondary | ICD-10-CM | POA: Diagnosis not present

## 2018-10-12 DIAGNOSIS — I2510 Atherosclerotic heart disease of native coronary artery without angina pectoris: Secondary | ICD-10-CM | POA: Diagnosis not present

## 2018-10-15 DIAGNOSIS — I69354 Hemiplegia and hemiparesis following cerebral infarction affecting left non-dominant side: Secondary | ICD-10-CM | POA: Diagnosis not present

## 2018-10-15 DIAGNOSIS — I5032 Chronic diastolic (congestive) heart failure: Secondary | ICD-10-CM | POA: Diagnosis not present

## 2018-10-15 DIAGNOSIS — I2510 Atherosclerotic heart disease of native coronary artery without angina pectoris: Secondary | ICD-10-CM | POA: Diagnosis not present

## 2018-10-15 DIAGNOSIS — I110 Hypertensive heart disease with heart failure: Secondary | ICD-10-CM | POA: Diagnosis not present

## 2018-10-15 DIAGNOSIS — I480 Paroxysmal atrial fibrillation: Secondary | ICD-10-CM | POA: Diagnosis not present

## 2018-10-15 DIAGNOSIS — E1142 Type 2 diabetes mellitus with diabetic polyneuropathy: Secondary | ICD-10-CM | POA: Diagnosis not present

## 2018-10-22 DIAGNOSIS — E1142 Type 2 diabetes mellitus with diabetic polyneuropathy: Secondary | ICD-10-CM | POA: Diagnosis not present

## 2018-10-22 DIAGNOSIS — I69354 Hemiplegia and hemiparesis following cerebral infarction affecting left non-dominant side: Secondary | ICD-10-CM | POA: Diagnosis not present

## 2018-10-22 DIAGNOSIS — I110 Hypertensive heart disease with heart failure: Secondary | ICD-10-CM | POA: Diagnosis not present

## 2018-10-22 DIAGNOSIS — I5032 Chronic diastolic (congestive) heart failure: Secondary | ICD-10-CM | POA: Diagnosis not present

## 2018-10-22 DIAGNOSIS — I2510 Atherosclerotic heart disease of native coronary artery without angina pectoris: Secondary | ICD-10-CM | POA: Diagnosis not present

## 2018-10-22 DIAGNOSIS — I480 Paroxysmal atrial fibrillation: Secondary | ICD-10-CM | POA: Diagnosis not present

## 2018-10-23 ENCOUNTER — Other Ambulatory Visit: Payer: Self-pay

## 2018-10-23 DIAGNOSIS — Z794 Long term (current) use of insulin: Secondary | ICD-10-CM

## 2018-10-23 DIAGNOSIS — E118 Type 2 diabetes mellitus with unspecified complications: Secondary | ICD-10-CM

## 2018-10-23 MED ORDER — TRESIBA 100 UNIT/ML ~~LOC~~ SOLN: 10.0000 [IU] | Freq: Every day | SUBCUTANEOUS | 11 refills | Status: DC

## 2018-10-25 ENCOUNTER — Telehealth: Payer: Self-pay | Admitting: Family Medicine

## 2018-10-25 DIAGNOSIS — I69354 Hemiplegia and hemiparesis following cerebral infarction affecting left non-dominant side: Secondary | ICD-10-CM | POA: Diagnosis not present

## 2018-10-25 NOTE — Telephone Encounter (Signed)
Received fax from [employer] that [place] requires a PA. Information has been sent to the insurance company. Awaiting determination.

## 2018-10-25 NOTE — Telephone Encounter (Signed)
Please call his grandaugher Fred [name] and ask her to call his insurance to see what is covered.  Please also update his emergency contact.  His wife is listed but she has dementia.  Please also let them know that flu shots are available

## 2018-10-25 NOTE — Telephone Encounter (Signed)
I received a notification that Fred [name] is not covered under the patient plan. Its excluded. Do you have any other recommendations? It did not give me alternatives. Please advise.

## 2018-10-26 DIAGNOSIS — I69354 Hemiplegia and hemiparesis following cerebral infarction affecting left non-dominant side: Secondary | ICD-10-CM | POA: Diagnosis not present

## 2018-10-26 DIAGNOSIS — I2510 Atherosclerotic heart disease of native coronary artery without angina pectoris: Secondary | ICD-10-CM | POA: Diagnosis not present

## 2018-10-26 DIAGNOSIS — I480 Paroxysmal atrial fibrillation: Secondary | ICD-10-CM | POA: Diagnosis not present

## 2018-10-26 DIAGNOSIS — I5032 Chronic diastolic (congestive) heart failure: Secondary | ICD-10-CM | POA: Diagnosis not present

## 2018-10-26 DIAGNOSIS — E1142 Type 2 diabetes mellitus with diabetic polyneuropathy: Secondary | ICD-10-CM | POA: Diagnosis not present

## 2018-10-26 DIAGNOSIS — I110 Hypertensive heart disease with heart failure: Secondary | ICD-10-CM | POA: Diagnosis not present

## 2018-10-26 MED ORDER — TOUJEO SOLOSTAR 300 UNIT/ML ~~LOC~~ SOPN: 10.0000 [IU] | PEN_INJECTOR | Freq: Every day | SUBCUTANEOUS | 5 refills | Status: DC

## 2018-10-26 NOTE — Telephone Encounter (Signed)
Will send over [place].

## 2018-10-29 DIAGNOSIS — E1142 Type 2 diabetes mellitus with diabetic polyneuropathy: Secondary | ICD-10-CM | POA: Diagnosis not present

## 2018-10-29 DIAGNOSIS — I69354 Hemiplegia and hemiparesis following cerebral infarction affecting left non-dominant side: Secondary | ICD-10-CM | POA: Diagnosis not present

## 2018-10-29 DIAGNOSIS — I480 Paroxysmal atrial fibrillation: Secondary | ICD-10-CM | POA: Diagnosis not present

## 2018-10-29 DIAGNOSIS — I2510 Atherosclerotic heart disease of native coronary artery without angina pectoris: Secondary | ICD-10-CM | POA: Diagnosis not present

## 2018-10-29 DIAGNOSIS — I110 Hypertensive heart disease with heart failure: Secondary | ICD-10-CM | POA: Diagnosis not present

## 2018-10-29 DIAGNOSIS — I5032 Chronic diastolic (congestive) heart failure: Secondary | ICD-10-CM | POA: Diagnosis not present

## 2018-10-31 DIAGNOSIS — I110 Hypertensive heart disease with heart failure: Secondary | ICD-10-CM | POA: Diagnosis not present

## 2018-10-31 DIAGNOSIS — I5032 Chronic diastolic (congestive) heart failure: Secondary | ICD-10-CM | POA: Diagnosis not present

## 2018-10-31 DIAGNOSIS — I480 Paroxysmal atrial fibrillation: Secondary | ICD-10-CM | POA: Diagnosis not present

## 2018-10-31 DIAGNOSIS — I2510 Atherosclerotic heart disease of native coronary artery without angina pectoris: Secondary | ICD-10-CM | POA: Diagnosis not present

## 2018-10-31 DIAGNOSIS — I69354 Hemiplegia and hemiparesis following cerebral infarction affecting left non-dominant side: Secondary | ICD-10-CM | POA: Diagnosis not present

## 2018-10-31 DIAGNOSIS — E1142 Type 2 diabetes mellitus with diabetic polyneuropathy: Secondary | ICD-10-CM | POA: Diagnosis not present

## 2018-11-05 DIAGNOSIS — I69354 Hemiplegia and hemiparesis following cerebral infarction affecting left non-dominant side: Secondary | ICD-10-CM | POA: Diagnosis not present

## 2018-11-05 DIAGNOSIS — E1142 Type 2 diabetes mellitus with diabetic polyneuropathy: Secondary | ICD-10-CM | POA: Diagnosis not present

## 2018-11-05 DIAGNOSIS — I480 Paroxysmal atrial fibrillation: Secondary | ICD-10-CM | POA: Diagnosis not present

## 2018-11-05 DIAGNOSIS — I5032 Chronic diastolic (congestive) heart failure: Secondary | ICD-10-CM | POA: Diagnosis not present

## 2018-11-05 DIAGNOSIS — I110 Hypertensive heart disease with heart failure: Secondary | ICD-10-CM | POA: Diagnosis not present

## 2018-11-05 DIAGNOSIS — I2510 Atherosclerotic heart disease of native coronary artery without angina pectoris: Secondary | ICD-10-CM | POA: Diagnosis not present

## 2018-11-08 DIAGNOSIS — Z87891 Personal history of nicotine dependence: Secondary | ICD-10-CM | POA: Diagnosis not present

## 2018-11-08 DIAGNOSIS — Z794 Long term (current) use of insulin: Secondary | ICD-10-CM | POA: Diagnosis not present

## 2018-11-08 DIAGNOSIS — I2510 Atherosclerotic heart disease of native coronary artery without angina pectoris: Secondary | ICD-10-CM | POA: Diagnosis not present

## 2018-11-08 DIAGNOSIS — M170 Bilateral primary osteoarthritis of knee: Secondary | ICD-10-CM | POA: Diagnosis not present

## 2018-11-08 DIAGNOSIS — K5790 Diverticulosis of intestine, part unspecified, without perforation or abscess without bleeding: Secondary | ICD-10-CM | POA: Diagnosis not present

## 2018-11-08 DIAGNOSIS — E1151 Type 2 diabetes mellitus with diabetic peripheral angiopathy without gangrene: Secondary | ICD-10-CM | POA: Diagnosis not present

## 2018-11-08 DIAGNOSIS — Z951 Presence of aortocoronary bypass graft: Secondary | ICD-10-CM | POA: Diagnosis not present

## 2018-11-08 DIAGNOSIS — G40009 Localization-related (focal) (partial) idiopathic epilepsy and epileptic syndromes with seizures of localized onset, not intractable, without status epilepticus: Secondary | ICD-10-CM | POA: Diagnosis not present

## 2018-11-08 DIAGNOSIS — I480 Paroxysmal atrial fibrillation: Secondary | ICD-10-CM | POA: Diagnosis not present

## 2018-11-08 DIAGNOSIS — Z7902 Long term (current) use of antithrombotics/antiplatelets: Secondary | ICD-10-CM | POA: Diagnosis not present

## 2018-11-08 DIAGNOSIS — I69398 Other sequelae of cerebral infarction: Secondary | ICD-10-CM | POA: Diagnosis not present

## 2018-11-08 DIAGNOSIS — I5032 Chronic diastolic (congestive) heart failure: Secondary | ICD-10-CM | POA: Diagnosis not present

## 2018-11-08 DIAGNOSIS — I69354 Hemiplegia and hemiparesis following cerebral infarction affecting left non-dominant side: Secondary | ICD-10-CM | POA: Diagnosis not present

## 2018-11-08 DIAGNOSIS — E785 Hyperlipidemia, unspecified: Secondary | ICD-10-CM | POA: Diagnosis not present

## 2018-11-08 DIAGNOSIS — E1142 Type 2 diabetes mellitus with diabetic polyneuropathy: Secondary | ICD-10-CM | POA: Diagnosis not present

## 2018-11-08 DIAGNOSIS — I110 Hypertensive heart disease with heart failure: Secondary | ICD-10-CM | POA: Diagnosis not present

## 2018-11-09 ENCOUNTER — Telehealth: Payer: Self-pay | Admitting: Family Medicine

## 2018-11-09 DIAGNOSIS — I69354 Hemiplegia and hemiparesis following cerebral infarction affecting left non-dominant side: Secondary | ICD-10-CM | POA: Diagnosis not present

## 2018-11-09 DIAGNOSIS — I5032 Chronic diastolic (congestive) heart failure: Secondary | ICD-10-CM | POA: Diagnosis not present

## 2018-11-09 DIAGNOSIS — I2510 Atherosclerotic heart disease of native coronary artery without angina pectoris: Secondary | ICD-10-CM | POA: Diagnosis not present

## 2018-11-09 DIAGNOSIS — I480 Paroxysmal atrial fibrillation: Secondary | ICD-10-CM | POA: Diagnosis not present

## 2018-11-09 DIAGNOSIS — E1142 Type 2 diabetes mellitus with diabetic polyneuropathy: Secondary | ICD-10-CM | POA: Diagnosis not present

## 2018-11-09 DIAGNOSIS — E118 Type 2 diabetes mellitus with unspecified complications: Secondary | ICD-10-CM

## 2018-11-09 DIAGNOSIS — Z794 Long term (current) use of insulin: Secondary | ICD-10-CM

## 2018-11-09 DIAGNOSIS — I110 Hypertensive heart disease with heart failure: Secondary | ICD-10-CM | POA: Diagnosis not present

## 2018-11-09 NOTE — Telephone Encounter (Signed)
Patients daughter called and left a message that her dad would like a freestyle libre sent to [employer]. He is getting tired of sticking his finger and the daughter thinks this will be easier for him. Please advise.

## 2018-11-10 NOTE — Telephone Encounter (Signed)
[place] for scrip but insurance may not cover.

## 2018-11-12 ENCOUNTER — Other Ambulatory Visit: Payer: Self-pay | Admitting: Family Medicine

## 2018-11-12 ENCOUNTER — Encounter: Payer: Self-pay | Admitting: Family Medicine

## 2018-11-12 ENCOUNTER — Ambulatory Visit: Payer: Medicare Other | Admitting: Sports Medicine

## 2018-11-12 ENCOUNTER — Other Ambulatory Visit: Payer: Self-pay

## 2018-11-12 ENCOUNTER — Ambulatory Visit (INDEPENDENT_AMBULATORY_CARE_PROVIDER_SITE_OTHER): Payer: Medicare Other | Admitting: Family Medicine

## 2018-11-12 VITALS — BP 138/62 | HR 81 | Temp 98.9°F

## 2018-11-12 DIAGNOSIS — Z794 Long term (current) use of insulin: Secondary | ICD-10-CM

## 2018-11-12 DIAGNOSIS — I10 Essential (primary) hypertension: Secondary | ICD-10-CM | POA: Diagnosis not present

## 2018-11-12 DIAGNOSIS — Z23 Encounter for immunization: Secondary | ICD-10-CM | POA: Diagnosis not present

## 2018-11-12 DIAGNOSIS — I69354 Hemiplegia and hemiparesis following cerebral infarction affecting left non-dominant side: Secondary | ICD-10-CM

## 2018-11-12 DIAGNOSIS — H26492 Other secondary cataract, left eye: Secondary | ICD-10-CM | POA: Diagnosis not present

## 2018-11-12 DIAGNOSIS — M170 Bilateral primary osteoarthritis of knee: Secondary | ICD-10-CM

## 2018-11-12 DIAGNOSIS — E118 Type 2 diabetes mellitus with unspecified complications: Secondary | ICD-10-CM

## 2018-11-12 DIAGNOSIS — H02831 Dermatochalasis of right upper eyelid: Secondary | ICD-10-CM | POA: Diagnosis not present

## 2018-11-12 DIAGNOSIS — I5032 Chronic diastolic (congestive) heart failure: Secondary | ICD-10-CM | POA: Diagnosis not present

## 2018-11-12 DIAGNOSIS — R600 Localized edema: Secondary | ICD-10-CM

## 2018-11-12 DIAGNOSIS — H527 Unspecified disorder of refraction: Secondary | ICD-10-CM | POA: Diagnosis not present

## 2018-11-12 DIAGNOSIS — E113292 Type 2 diabetes mellitus with mild nonproliferative diabetic retinopathy without macular edema, left eye: Secondary | ICD-10-CM | POA: Diagnosis not present

## 2018-11-12 DIAGNOSIS — H02834 Dermatochalasis of left upper eyelid: Secondary | ICD-10-CM | POA: Diagnosis not present

## 2018-11-12 LAB — POCT GLYCOSYLATED HEMOGLOBIN (HGB A1C): Hemoglobin A1C: 7.1 % — AB (ref 4.0–5.6)

## 2018-11-12 LAB — HM DIABETES EYE EXAM

## 2018-11-12 MED ORDER — FREESTYLE LIBRE 14 DAY READER DEVI: 1.0000 | Freq: Two times a day (BID) | 99 refills | Status: DC

## 2018-11-12 MED ORDER — FREESTYLE LIBRE 2 SENSOR SYSTM MISC: 1.0000 | Freq: Two times a day (BID) | 99 refills | Status: DC

## 2018-11-12 NOTE — Telephone Encounter (Signed)
Daughter advised.

## 2018-11-12 NOTE — Assessment & Plan Note (Addendum)
Hemoglobin A1c looks great at 7.1 today.  Well controlled. Continue current regimen. Follow up in  6 mo

## 2018-11-12 NOTE — Assessment & Plan Note (Signed)
Contracture of the left hand and chronic edema of the left lower extremity.  I did not see anything quite worrisome with the swelling today but just encouraged him to keep an eye on it and try to keep his leg elevated for at least 20 minutes 3 times a day.

## 2018-11-12 NOTE — Progress Notes (Signed)
Established Patient Office Visit  Subjective:  Patient ID: Fred [name], male    DOB: 11/11/1935  Age: 83 y.o. MRN: [id number]  CC:  Chief Complaint  Patient presents with  . Hypertension  . Diabetes    HPI Fred [name] presents for   Hypertension- Pt denies chest pain, SOB, dizziness, or heart palpitations.  Taking meds as directed w/o problems.  Denies medication side effects.    Diabetes - no hypoglycemic events. No wounds or sores that are not healing well. No increased thirst or urination. Checking glucose at home. Taking medications as prescribed without any side effects.  He is using 14 units of Toujeo.   He also complains of leg swelling.  He often gets a little bit of swelling in both legs but the left is more persistently swollen.  Though he has had it worked up before because that the side he is more weak after his stroke he does tend to collect more fluid.  He just wants to make sure it is not getting worse.    Past Medical History:  Diagnosis Date  . Atrial fibrillation ([place])    Post operative  . CAD (coronary artery disease)    s/p cabg  . CHF (congestive heart failure) ([place])   . Diverticulosis   . DJD (degenerative joint disease)   . DM (diabetes mellitus) ([place])   . History of anemia   . History of stroke   . Hyperlipidemia   . Hypertension   . 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 Peripheral vascular disease ([place])   . Polyneuropathy, diabetic ([place])   . Seizure disorder, complex partial ([place])   . Syncope and collapse     Past Surgical History:  Procedure Laterality Date  . CATARACT EXTRACTION W/ INTRAOCULAR LENS  IMPLANT, BILATERAL  06/2013   Dr. [name]  . CORONARY ARTERY BYPASS GRAFT  02/13/2004   4 vesel  . removal of sebaceous cyst from neck  08/2007   Dr. [name]  . TONSILLECTOMY      Family History  Problem Relation Age of Onset  . Epilepsy Father   . Stroke Mother   . Lung cancer Other     Social  History   Socioeconomic History  . Marital status: Married    Spouse name: Whitt Auletta  . Number of children: 3  . Years of education: Not on file  . Highest education level: Not on file  Occupational History  . 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 Financial resource strain: Not on file  . Food insecurity    Worry: Not on file    Inability: Not on file  . Transportation needs    Medical: Not on file    Non-medical: Not on file  Tobacco Use  . Smoking status: Former Smoker    Packs/day: 1.00    Years: 22.00    Pack years: 22.00    Types: Cigarettes    Quit date: 03/14/1977    Years since quitting: 41.6  . Smokeless tobacco: Never Used  Substance and Sexual Activity  . Alcohol use: Not Currently    Comment: Pt has not had alcohol since 03/2017 CVA  . Drug use: No  . Sexual activity: Never  Lifestyle  . Physical activity    Days per week: Not on file    Minutes per session: Not on file  . Stress: Not  on file  Relationships  . Social Herbalist on phone: Not on file    Gets together: Not on file    Attends religious service: Not on file    Active member of club or organization: Not on file    Attends meetings of clubs or organizations: Not on file    Relationship status: Not on file  . Intimate partner violence    Fear of current or ex partner: Not on file    Emotionally abused: Not on file    Physically abused: Not on file    Forced sexual activity: Not on file  Other Topics Concern  . Not on file  Social History Narrative   3 caffeinated drinks per day. Mostly coffee. He walks 15 minutes 3 days per week. Quit smoking in 1978.      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     Outpatient Medications Prior to Visit  Medication Sig Dispense Refill  . acetaminophen (TYLENOL) 325 MG tablet Take 2 tablets  (650 mg total) by mouth every 4 (four) hours as needed for mild pain (or temp > 37.5 C (99.5 F)).    Marland Kitchen allopurinol (ZYLOPRIM) 300 MG tablet Take 1 tablet (300 mg total) by mouth daily. 90 tablet 1  . atorvastatin (LIPITOR) 80 MG tablet Take 1 tablet (80 mg total) by mouth daily. 90 tablet 3  . blood glucose meter kit and supplies Check blood sugar 3 times daily. 1 each 0  . clopidogrel (PLAVIX) 75 MG tablet Take 1 tablet (75 mg total) by mouth daily. 90 tablet 3  . Coenzyme Q10 (COQ10) 30 MG CAPS Take 1 tablet by mouth daily.      . diclofenac sodium (VOLTAREN) 1 % GEL Apply 2 g topically 4 (four) times daily. 400 g 12  . divalproex (DEPAKOTE ER) 500 MG 24 hr tablet Take 1 tablet (500 mg total) by mouth daily. 90 tablet 3  . ezetimibe (ZETIA) 10 MG tablet Take 1 tablet (10 mg total) by mouth daily. 90 tablet 3  . furosemide (LASIX) 20 MG tablet Take 1 tablet (20 mg total) by mouth daily. 90 tablet 1  . Insulin Glargine, 1 Unit Dial, (TOUJEO SOLOSTAR) 300 UNIT/ML SOPN Inject 10-20 Units into the skin at bedtime. 2 pen 5  . Insulin Pen Needle (NOVOFINE) 32G X 6 MM MISC Inject insulin daily 100 each prn  . Lancets (ONETOUCH DELICA PLUS [id number]) MISC     . linagliptin (TRADJENTA) 5 MG TABS tablet Take 1 tablet (5 mg total) by mouth daily. 90 tablet 1  . losartan (COZAAR) 25 MG tablet Take 1 tablet (25 mg total) by mouth daily. 90 tablet 1  . metFORMIN (GLUCOPHAGE) 500 MG tablet TAKE 1 TABLET BY MOUTH TWICE DAILY WITH A MEAL 180 tablet 1  . metoprolol succinate (TOPROL-XL) 50 MG 24 hr tablet Take 1 tablet (50 mg total) by mouth daily. Take with or immediately following a meal. 90 tablet 1  . niacin (NIASPAN) 500 MG CR tablet Take 1 tablet (500 mg total) by mouth at bedtime. 90 tablet 3  . ONE TOUCH ULTRA TEST test strip     . senna-docusate (SENOKOT-S) 8.6-50 MG tablet Take 1 tablet by mouth 2 (two) times daily. (Patient taking differently: Take 1 tablet by mouth as needed. )    . Continuous Blood  Gluc Receiver (FREESTYLE  LIBRE 14 DAY READER) DEVI 1 each by Does not apply route 2 (two) times daily. Dx DM E11.8 1 each prn  . Continuous Blood Gluc Sensor (FREESTYLE LIBRE 2 SENSOR SYSTM) MISC 1 each by Does not apply route 2 (two) times daily. Dx Dm E11.8 1 each prn   No facility-administered medications prior to visit.     Allergies  Allergen Reactions  . Lisinopril Cough    ROS Review of Systems    Objective:    Physical Exam  Constitutional: He is oriented to person, place, and time. He appears well-developed and well-nourished.  HENT:  Head: Normocephalic and atraumatic.  Cardiovascular: Normal rate, regular rhythm and normal heart sounds.  Pulmonary/Chest: Effort normal and breath sounds normal.  Musculoskeletal:     Comments: Trace pitting edema in the right lower leg, 1+ pitting edema in the left lower leg from the knee down.  Neurological: He is alert and oriented to person, place, and time.  Skin: Skin is warm and dry.  Psychiatric: He has a normal mood and affect. His behavior is normal.    BP 138/62   Pulse 81   Temp 98.9 F (37.2 C)   SpO2 100%  Wt Readings from Last 3 Encounters:  07/31/18 161 lb (73 kg)  06/01/18 165 lb (74.8 kg)  03/28/18 166 lb (75.3 kg)     Health Maintenance Due  Topic Date Due  . OPHTHALMOLOGY EXAM  11/10/2017  . FOOT EXAM  05/11/2018  . HEMOGLOBIN A1C  09/01/2018  . INFLUENZA VACCINE  10/13/2018    There are no preventive care reminders to display for this patient.  Lab Results  Component Value Date   TSH 0.99 03/28/2018   Lab Results  Component Value Date   WBC 15.7 (H) 03/28/2018   HGB 13.4 03/28/2018   HCT 39.1 03/28/2018   MCV 94.9 03/28/2018   PLT 410 (H) 03/28/2018   Lab Results  Component Value Date   NA 142 03/28/2018   K 3.3 (L) 03/28/2018   CO2 31 03/28/2018   GLUCOSE 204 (H) 03/28/2018   BUN 14 03/28/2018   CREATININE 1.19 (H) 03/28/2018   BILITOT 0.4 03/28/2018   ALKPHOS 39 08/14/2017   AST  11 03/28/2018   ALT 10 03/28/2018   PROT 6.4 03/28/2018   ALBUMIN 3.2 (L) 08/14/2017   CALCIUM 9.0 03/28/2018   ANIONGAP 6 09/11/2017   GFR 84.99 05/16/2013   Lab Results  Component Value Date   CHOL 195 08/10/2017   Lab Results  Component Value Date   HDL 40 (L) 08/10/2017   Lab Results  Component Value Date   LDLCALC 130 (H) 08/10/2017   Lab Results  Component Value Date   TRIG 125 08/10/2017   Lab Results  Component Value Date   CHOLHDL 4.9 08/10/2017   Lab Results  Component Value Date   HGBA1C 7.1 (A) 11/12/2018      Assessment & Plan:   Problem List Items Addressed This Visit      Cardiovascular and Mediastinum   Essential hypertension    Well controlled. Continue current regimen. Follow up in  6 months.       Relevant Orders   COMPLETE METABOLIC PANEL WITH GFR   Lipid panel   CBC   TSH   Chronic diastolic (congestive) heart failure ([place])    Does have some trace edema in the right ankle and 1+ on the left.  Will monitor carefully over the next week and try to elevate  legs throughout the day        Endocrine   Type 2 diabetes mellitus with complication, with long-term current use of insulin ([facility]) - Primary    Hemoglobin A1c looks great at 7.1 today.  Well controlled. Continue current regimen. Follow up in  6 mo      Relevant Orders   POCT glycosylated hemoglobin (Hb A1C) (Completed)   COMPLETE METABOLIC PANEL WITH GFR   Lipid panel   CBC   TSH     Nervous and Auditory   Hemiparesis affecting left side as late effect of cerebrovascular accident ([place])    Contracture of the left hand and chronic edema of the left lower extremity.  I did not see anything quite worrisome with the swelling today but just encouraged him to keep an eye on it and try to keep his leg elevated for at least 20 minutes 3 times a day.      Relevant Orders   COMPLETE METABOLIC PANEL WITH GFR   Lipid panel   CBC   TSH    Other Visit Diagnoses    Need for immunization  against influenza       Relevant Orders   Flu Vaccine QUAD High Dose(Fluad) (Completed)   Lower extremity edema       Relevant Orders   COMPLETE METABOLIC PANEL WITH GFR   Lipid panel   CBC   TSH      Lower extremity edema-worse on the left.  He has been sitting in his electric wheelchair more over the last couple weeks and they have been doing some repairs on his bathroom and he says he wants to keep an eye on them.  No orders of the defined types were placed in this encounter.   Follow-up: Return in about 6 months (around 05/12/2019) for Diabetes follow-up, Hypertension.    Beatrice Lecher, MD

## 2018-11-12 NOTE — Progress Notes (Signed)
Subjective:    CC: Bilateral knee pain  HPI: Fred [name] is a very pleasant 83 year old male, he has bilateral knee osteoarthritis, he is having recurrence of pain, medial joint lines, bilateral, right worse than left, no mechanical symptoms, no trauma, this does make it very difficult for him to bear weight, he is for the most part wheelchair-bound now.  He desires repeat interventional treatment today.  I reviewed the past medical history, family history, social history, surgical history, and allergies today and no changes were needed.  Please see the problem list section below in epic for further details.  Past Medical History: Past Medical History:  Diagnosis Date  . Atrial fibrillation ([place])    Post operative  . CAD (coronary artery disease)    s/p cabg  . CHF (congestive heart failure) ([place])   . Diverticulosis   . DJD (degenerative joint disease)   . DM (diabetes mellitus) ([place])   . History of anemia   . History of stroke   . Hyperlipidemia   . Hypertension   . 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 Peripheral vascular disease ([place])   . Polyneuropathy, diabetic ([place])   . Seizure disorder, complex partial ([place])   . Syncope and collapse    Past Surgical History: Past Surgical History:  Procedure Laterality Date  . CATARACT EXTRACTION W/ INTRAOCULAR LENS  IMPLANT, BILATERAL  06/2013   Dr. [name]  . CORONARY ARTERY BYPASS GRAFT  02/13/2004   4 vesel  . removal of sebaceous cyst from neck  08/2007   Dr. [name]  . TONSILLECTOMY     Social History: Social History   Socioeconomic History  . Marital status: Married    Spouse name: Orlando Muston  . Number of children: 3  . Years of education: Not on file  . Highest education level: Not on file  Occupational History  . 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 Financial resource strain: Not on file  . Food insecurity    Worry: Not on  file    Inability: Not on file  . Transportation needs    Medical: Not on file    Non-medical: Not on file  Tobacco Use  . Smoking status: Former Smoker    Packs/day: 1.00    Years: 22.00    Pack years: 22.00    Types: Cigarettes    Quit date: 03/14/1977    Years since quitting: 41.6  . Smokeless tobacco: Never Used  Substance and Sexual Activity  . Alcohol use: Not Currently    Comment: Pt has not had alcohol since 03/2017 CVA  . Drug use: No  . Sexual activity: Never  Lifestyle  . Physical activity    Days per week: Not on file    Minutes per session: Not on file  . Stress: Not on file  Relationships  . Social Herbalist on phone: Not on file    Gets together: Not on file    Attends religious service: Not on file    Active member of club or organization: Not on file    Attends meetings of clubs or organizations: Not on file    Relationship status: Not on file  Other Topics Concern  . Not on file  Social History Narrative   3 caffeinated drinks per day. Mostly coffee. He walks 15 minutes 3 days per week. Quit smoking in 1978.  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    Family History: Family History  Problem Relation Age of Onset  . Epilepsy Father   . Stroke Mother   . Lung cancer Other    Allergies: Allergies  Allergen Reactions  . Lisinopril Cough   Medications: See med rec.  Review of Systems: No fevers, chills, night sweats, weight loss, chest pain, or shortness of breath.   Objective:    General: Well Developed, well nourished, and in no acute distress.  Neuro: Alert and oriented x3, extra-ocular muscles intact, sensation grossly intact.  HEENT: Normocephalic, atraumatic, pupils equal round reactive to light, neck supple, no masses, no lymphadenopathy, thyroid nonpalpable.  Skin: Warm and dry, no rashes. Cardiac: Regular rate and rhythm, no murmurs rubs  or gallops, no lower extremity edema.  Respiratory: Clear to auscultation bilaterally. Not using accessory muscles, speaking in full sentences. Bilateral knees: Tender to palpation at the medial joint line bilaterally, there is a bit of swelling in the right knee ROM normal in flexion and extension and lower leg rotation. Ligaments with solid consistent endpoints including ACL, PCL, LCL, MCL. Negative Mcmurray's and provocative meniscal tests. Non painful patellar compression. Patellar and quadriceps tendons unremarkable. Hamstring and quadriceps strength is normal. Wheelchair-bound  Procedure: Real-time Ultrasound Guided injection of the left knee Device: GE Logiq E  Verbal informed consent obtained.  Time-out conducted.  Noted no overlying erythema, induration, or other signs of local infection.  Skin prepped in a sterile fashion.  Local anesthesia: Topical Ethyl chloride.  With sterile technique and under real time ultrasound guidance:  1 cc Kenalog 40, 2 cc lidocaine, 2 cc bupivacaine injected into the suprapatellar recess Completed without difficulty  Pain immediately resolved suggesting accurate placement of the medication.  Advised to call if fevers/chills, erythema, induration, drainage, or persistent bleeding.  Images permanently stored and available for review in the ultrasound unit.  Impression: Technically successful ultrasound guided injection.  Procedure: Real-time Ultrasound Guided injection of the right knee Device: GE Logiq E  Verbal informed consent obtained.  Time-out conducted.  Noted no overlying erythema, induration, or other signs of local infection.  Skin prepped in a sterile fashion.  Local anesthesia: Topical Ethyl chloride.  With sterile technique and under real time ultrasound guidance:  1 cc Kenalog 40, 2 cc lidocaine, 2 cc bupivacaine injected into the suprapatellar recess Completed without difficulty  Pain immediately resolved suggesting accurate  placement of the medication.  Advised to call if fevers/chills, erythema, induration, drainage, or persistent bleeding.  Images permanently stored and available for review in the ultrasound unit.  Impression: Technically successful ultrasound guided injection.  Impression and Recommendations:    Primary osteoarthritis of both knees Negative crystal analysis. We last injected his knees in November 2019. He did well until now. Both knees were injected again today. Ultimately we could restart aquatic physical therapy as well as restart Orthovisc if needed.   ___________________________________________ [name]. Dianah Field, M.D., ABFM., CAQSM. Primary Care and Sports Medicine [facility]  Adjunct Professor of [employer]

## 2018-11-12 NOTE — Assessment & Plan Note (Signed)
Well controlled. Continue current regimen. Follow up in  6 months.  

## 2018-11-12 NOTE — Assessment & Plan Note (Signed)
Does have some trace edema in the right ankle and 1+ on the left.  Will monitor carefully over the next week and try to elevate legs throughout the day

## 2018-11-12 NOTE — Assessment & Plan Note (Signed)
Negative crystal analysis. We last injected his knees in November 2019. He did well until now. Both knees were injected again today. Ultimately we could restart aquatic physical therapy as well as restart Orthovisc if needed.

## 2018-11-14 ENCOUNTER — Other Ambulatory Visit: Payer: Self-pay

## 2018-11-14 DIAGNOSIS — I5032 Chronic diastolic (congestive) heart failure: Secondary | ICD-10-CM | POA: Diagnosis not present

## 2018-11-14 DIAGNOSIS — I2510 Atherosclerotic heart disease of native coronary artery without angina pectoris: Secondary | ICD-10-CM | POA: Diagnosis not present

## 2018-11-14 DIAGNOSIS — E1142 Type 2 diabetes mellitus with diabetic polyneuropathy: Secondary | ICD-10-CM | POA: Diagnosis not present

## 2018-11-14 DIAGNOSIS — I480 Paroxysmal atrial fibrillation: Secondary | ICD-10-CM | POA: Diagnosis not present

## 2018-11-14 DIAGNOSIS — I110 Hypertensive heart disease with heart failure: Secondary | ICD-10-CM | POA: Diagnosis not present

## 2018-11-14 DIAGNOSIS — I69354 Hemiplegia and hemiparesis following cerebral infarction affecting left non-dominant side: Secondary | ICD-10-CM | POA: Diagnosis not present

## 2018-11-14 MED ORDER — ATORVASTATIN CALCIUM 80 MG PO TABS: 80.0000 mg | ORAL_TABLET | Freq: Every day | ORAL | 3 refills | Status: DC

## 2018-11-15 ENCOUNTER — Encounter: Payer: Self-pay | Admitting: Family Medicine

## 2018-11-20 ENCOUNTER — Other Ambulatory Visit: Payer: Self-pay | Admitting: Family Medicine

## 2018-11-21 DIAGNOSIS — E1142 Type 2 diabetes mellitus with diabetic polyneuropathy: Secondary | ICD-10-CM | POA: Diagnosis not present

## 2018-11-21 DIAGNOSIS — I110 Hypertensive heart disease with heart failure: Secondary | ICD-10-CM | POA: Diagnosis not present

## 2018-11-21 DIAGNOSIS — I480 Paroxysmal atrial fibrillation: Secondary | ICD-10-CM | POA: Diagnosis not present

## 2018-11-21 DIAGNOSIS — I5032 Chronic diastolic (congestive) heart failure: Secondary | ICD-10-CM | POA: Diagnosis not present

## 2018-11-21 DIAGNOSIS — I2510 Atherosclerotic heart disease of native coronary artery without angina pectoris: Secondary | ICD-10-CM | POA: Diagnosis not present

## 2018-11-21 DIAGNOSIS — I69354 Hemiplegia and hemiparesis following cerebral infarction affecting left non-dominant side: Secondary | ICD-10-CM | POA: Diagnosis not present

## 2018-11-22 DIAGNOSIS — I2510 Atherosclerotic heart disease of native coronary artery without angina pectoris: Secondary | ICD-10-CM | POA: Diagnosis not present

## 2018-11-22 DIAGNOSIS — I69354 Hemiplegia and hemiparesis following cerebral infarction affecting left non-dominant side: Secondary | ICD-10-CM | POA: Diagnosis not present

## 2018-11-22 DIAGNOSIS — I5032 Chronic diastolic (congestive) heart failure: Secondary | ICD-10-CM | POA: Diagnosis not present

## 2018-11-22 DIAGNOSIS — I480 Paroxysmal atrial fibrillation: Secondary | ICD-10-CM | POA: Diagnosis not present

## 2018-11-22 DIAGNOSIS — E1142 Type 2 diabetes mellitus with diabetic polyneuropathy: Secondary | ICD-10-CM | POA: Diagnosis not present

## 2018-11-22 DIAGNOSIS — I110 Hypertensive heart disease with heart failure: Secondary | ICD-10-CM | POA: Diagnosis not present

## 2018-11-23 ENCOUNTER — Telehealth: Payer: Self-pay

## 2018-11-23 DIAGNOSIS — I110 Hypertensive heart disease with heart failure: Secondary | ICD-10-CM | POA: Diagnosis not present

## 2018-11-23 DIAGNOSIS — I5032 Chronic diastolic (congestive) heart failure: Secondary | ICD-10-CM | POA: Diagnosis not present

## 2018-11-23 DIAGNOSIS — I2510 Atherosclerotic heart disease of native coronary artery without angina pectoris: Secondary | ICD-10-CM | POA: Diagnosis not present

## 2018-11-23 DIAGNOSIS — I480 Paroxysmal atrial fibrillation: Secondary | ICD-10-CM | POA: Diagnosis not present

## 2018-11-23 DIAGNOSIS — E1142 Type 2 diabetes mellitus with diabetic polyneuropathy: Secondary | ICD-10-CM | POA: Diagnosis not present

## 2018-11-23 DIAGNOSIS — I69354 Hemiplegia and hemiparesis following cerebral infarction affecting left non-dominant side: Secondary | ICD-10-CM | POA: Diagnosis not present

## 2018-11-23 MED ORDER — EZETIMIBE 10 MG PO TABS: 10.0000 mg | ORAL_TABLET | Freq: Every day | ORAL | 3 refills | Status: DC

## 2018-11-23 MED ORDER — NIACIN ER (ANTIHYPERLIPIDEMIC) 500 MG PO TBCR: 500.0000 mg | EXTENDED_RELEASE_TABLET | Freq: Every day | ORAL | 3 refills | Status: DC

## 2018-11-23 NOTE — Telephone Encounter (Signed)
Donna advised.

## 2018-11-23 NOTE — Telephone Encounter (Signed)
Fred [name] is requesting a refill on Zetia and Niacin. Not prescribed last by Dr Madilyn Fireman. Please advise.

## 2018-11-23 NOTE — Telephone Encounter (Signed)
No problem, done. 

## 2018-11-24 ENCOUNTER — Emergency Department (HOSPITAL_COMMUNITY): Payer: Medicare Other

## 2018-11-24 ENCOUNTER — Encounter (HOSPITAL_COMMUNITY): Payer: Self-pay | Admitting: Emergency Medicine

## 2018-11-24 ENCOUNTER — Inpatient Hospital Stay (HOSPITAL_COMMUNITY)
Admission: EM | Admit: 2018-11-24 | Discharge: 2018-11-27 | DRG: 872 | Disposition: A | Discharge status: Home Health | Payer: Medicare Other | Attending: Internal Medicine | Admitting: Internal Medicine

## 2018-11-24 ENCOUNTER — Other Ambulatory Visit: Payer: Self-pay

## 2018-11-24 DIAGNOSIS — Z820 Family history of epilepsy and other diseases of the nervous system: Secondary | ICD-10-CM | POA: Diagnosis not present

## 2018-11-24 DIAGNOSIS — K5790 Diverticulosis of intestine, part unspecified, without perforation or abscess without bleeding: Secondary | ICD-10-CM | POA: Diagnosis present

## 2018-11-24 DIAGNOSIS — I10 Essential (primary) hypertension: Secondary | ICD-10-CM | POA: Diagnosis not present

## 2018-11-24 DIAGNOSIS — I639 Cerebral infarction, unspecified: Secondary | ICD-10-CM | POA: Diagnosis present

## 2018-11-24 DIAGNOSIS — E1142 Type 2 diabetes mellitus with diabetic polyneuropathy: Secondary | ICD-10-CM | POA: Diagnosis present

## 2018-11-24 DIAGNOSIS — I480 Paroxysmal atrial fibrillation: Secondary | ICD-10-CM | POA: Diagnosis present

## 2018-11-24 DIAGNOSIS — R4182 Altered mental status, unspecified: Secondary | ICD-10-CM | POA: Diagnosis not present

## 2018-11-24 DIAGNOSIS — N390 Urinary tract infection, site not specified: Secondary | ICD-10-CM | POA: Diagnosis present

## 2018-11-24 DIAGNOSIS — R531 Weakness: Secondary | ICD-10-CM | POA: Diagnosis not present

## 2018-11-24 DIAGNOSIS — Z823 Family history of stroke: Secondary | ICD-10-CM | POA: Diagnosis not present

## 2018-11-24 DIAGNOSIS — E785 Hyperlipidemia, unspecified: Secondary | ICD-10-CM | POA: Diagnosis present

## 2018-11-24 DIAGNOSIS — I4892 Unspecified atrial flutter: Secondary | ICD-10-CM | POA: Diagnosis present

## 2018-11-24 DIAGNOSIS — Z951 Presence of aortocoronary bypass graft: Secondary | ICD-10-CM

## 2018-11-24 DIAGNOSIS — Z20828 Contact with and (suspected) exposure to other viral communicable diseases: Secondary | ICD-10-CM | POA: Diagnosis present

## 2018-11-24 DIAGNOSIS — I110 Hypertensive heart disease with heart failure: Secondary | ICD-10-CM | POA: Diagnosis present

## 2018-11-24 DIAGNOSIS — Z794 Long term (current) use of insulin: Secondary | ICD-10-CM

## 2018-11-24 DIAGNOSIS — Y92019 Unspecified place in single-family (private) house as the place of occurrence of the external cause: Secondary | ICD-10-CM

## 2018-11-24 DIAGNOSIS — A419 Sepsis, unspecified organism: Principal | ICD-10-CM

## 2018-11-24 DIAGNOSIS — Z888 Allergy status to other drugs, medicaments and biological substances status: Secondary | ICD-10-CM | POA: Diagnosis not present

## 2018-11-24 DIAGNOSIS — S80212A Abrasion, left knee, initial encounter: Secondary | ICD-10-CM | POA: Diagnosis present

## 2018-11-24 DIAGNOSIS — I959 Hypotension, unspecified: Secondary | ICD-10-CM | POA: Diagnosis not present

## 2018-11-24 DIAGNOSIS — I5032 Chronic diastolic (congestive) heart failure: Secondary | ICD-10-CM | POA: Diagnosis present

## 2018-11-24 DIAGNOSIS — I69354 Hemiplegia and hemiparesis following cerebral infarction affecting left non-dominant side: Secondary | ICD-10-CM | POA: Diagnosis not present

## 2018-11-24 DIAGNOSIS — E1159 Type 2 diabetes mellitus with other circulatory complications: Secondary | ICD-10-CM | POA: Diagnosis present

## 2018-11-24 DIAGNOSIS — I152 Hypertension secondary to endocrine disorders: Secondary | ICD-10-CM | POA: Diagnosis present

## 2018-11-24 DIAGNOSIS — E1151 Type 2 diabetes mellitus with diabetic peripheral angiopathy without gangrene: Secondary | ICD-10-CM | POA: Diagnosis present

## 2018-11-24 DIAGNOSIS — E119 Type 2 diabetes mellitus without complications: Secondary | ICD-10-CM | POA: Diagnosis not present

## 2018-11-24 DIAGNOSIS — M170 Bilateral primary osteoarthritis of knee: Secondary | ICD-10-CM | POA: Diagnosis present

## 2018-11-24 DIAGNOSIS — I6300 Cerebral infarction due to thrombosis of unspecified precerebral artery: Secondary | ICD-10-CM | POA: Diagnosis not present

## 2018-11-24 DIAGNOSIS — W050XXA Fall from non-moving wheelchair, initial encounter: Secondary | ICD-10-CM | POA: Diagnosis present

## 2018-11-24 DIAGNOSIS — R29810 Facial weakness: Secondary | ICD-10-CM | POA: Diagnosis not present

## 2018-11-24 DIAGNOSIS — I2510 Atherosclerotic heart disease of native coronary artery without angina pectoris: Secondary | ICD-10-CM | POA: Diagnosis present

## 2018-11-24 DIAGNOSIS — E118 Type 2 diabetes mellitus with unspecified complications: Secondary | ICD-10-CM | POA: Diagnosis not present

## 2018-11-24 DIAGNOSIS — M1990 Unspecified osteoarthritis, unspecified site: Secondary | ICD-10-CM | POA: Diagnosis present

## 2018-11-24 DIAGNOSIS — Z7902 Long term (current) use of antithrombotics/antiplatelets: Secondary | ICD-10-CM

## 2018-11-24 DIAGNOSIS — G40209 Localization-related (focal) (partial) symptomatic epilepsy and epileptic syndromes with complex partial seizures, not intractable, without status epilepticus: Secondary | ICD-10-CM | POA: Diagnosis present

## 2018-11-24 DIAGNOSIS — R0902 Hypoxemia: Secondary | ICD-10-CM | POA: Diagnosis not present

## 2018-11-24 DIAGNOSIS — Z87891 Personal history of nicotine dependence: Secondary | ICD-10-CM | POA: Diagnosis not present

## 2018-11-24 DIAGNOSIS — J9811 Atelectasis: Secondary | ICD-10-CM | POA: Diagnosis not present

## 2018-11-24 DIAGNOSIS — M109 Gout, unspecified: Secondary | ICD-10-CM | POA: Diagnosis present

## 2018-11-24 LAB — CBC WITH DIFFERENTIAL/PLATELET
Abs Immature Granulocytes: 0.17 10*3/uL — ABNORMAL HIGH (ref 0.00–0.07)
Basophils Absolute: 0.1 10*3/uL (ref 0.0–0.1)
Basophils Relative: 0 %
Eosinophils Absolute: 0 10*3/uL (ref 0.0–0.5)
Eosinophils Relative: 0 %
HCT: 40.5 % (ref 39.0–52.0)
Hemoglobin: 13.8 g/dL (ref 13.0–17.0)
Immature Granulocytes: 1 %
Lymphocytes Relative: 6 %
Lymphs Abs: 1.3 10*3/uL (ref 0.7–4.0)
MCH: 32.7 pg (ref 26.0–34.0)
MCHC: 34.1 g/dL (ref 30.0–36.0)
MCV: 96 fL (ref 80.0–100.0)
Monocytes Absolute: 1.9 10*3/uL — ABNORMAL HIGH (ref 0.1–1.0)
Monocytes Relative: 8 %
Neutro Abs: 19.2 10*3/uL — ABNORMAL HIGH (ref 1.7–7.7)
Neutrophils Relative %: 85 %
Platelets: 201 10*3/uL (ref 150–400)
RBC: 4.22 MIL/uL (ref 4.22–5.81)
RDW: 13 % (ref 11.5–15.5)
WBC: 22.6 10*3/uL — ABNORMAL HIGH (ref 4.0–10.5)
nRBC: 0 % (ref 0.0–0.2)

## 2018-11-24 LAB — COMPREHENSIVE METABOLIC PANEL
ALT: 18 U/L (ref 0–44)
AST: 15 U/L (ref 15–41)
Albumin: 3.3 g/dL — ABNORMAL LOW (ref 3.5–5.0)
Alkaline Phosphatase: 60 U/L (ref 38–126)
Anion gap: 11 (ref 5–15)
BUN: 19 mg/dL (ref 8–23)
CO2: 23 mmol/L (ref 22–32)
Calcium: 8.9 mg/dL (ref 8.9–10.3)
Chloride: 103 mmol/L (ref 98–111)
Creatinine, Ser: 1.31 mg/dL — ABNORMAL HIGH (ref 0.61–1.24)
GFR calc Af Amer: 59 mL/min — ABNORMAL LOW (ref >60)
GFR calc non Af Amer: 51 mL/min — ABNORMAL LOW (ref >60)
Glucose, Bld: 250 mg/dL — ABNORMAL HIGH (ref 70–99)
Potassium: 4.5 mmol/L (ref 3.5–5.1)
Sodium: 137 mmol/L (ref 135–145)
Total Bilirubin: 1.2 mg/dL (ref 0.3–1.2)
Total Protein: 6.4 g/dL — ABNORMAL LOW (ref 6.5–8.1)

## 2018-11-24 LAB — LACTIC ACID, PLASMA
Lactic Acid, Venous: 1.6 mmol/L (ref 0.5–1.9)
Lactic Acid, Venous: 2.9 mmol/L (ref 0.5–1.9)

## 2018-11-24 LAB — URINALYSIS, ROUTINE W REFLEX MICROSCOPIC
Bilirubin Urine: NEGATIVE
Glucose, UA: 150 mg/dL — AB
Ketones, ur: 5 mg/dL — AB
Nitrite: NEGATIVE
Protein, ur: 30 mg/dL — AB
Specific Gravity, Urine: 1.016 (ref 1.005–1.030)
pH: 5 (ref 5.0–8.0)

## 2018-11-24 LAB — GLUCOSE, CAPILLARY: Glucose-Capillary: 211 mg/dL — ABNORMAL HIGH (ref 70–99)

## 2018-11-24 LAB — SARS CORONAVIRUS 2 BY RT PCR (HOSPITAL ORDER, PERFORMED IN ~~LOC~~ HOSPITAL LAB): SARS Coronavirus 2: NEGATIVE

## 2018-11-24 LAB — CBG MONITORING, ED: Glucose-Capillary: 228 mg/dL — ABNORMAL HIGH (ref 70–99)

## 2018-11-24 MED ORDER — SENNOSIDES-DOCUSATE SODIUM 8.6-50 MG PO TABS: 1.0000 | ORAL_TABLET | Freq: Every day | ORAL | Status: DC | PRN

## 2018-11-24 MED ORDER — COQ10 100 MG PO CAPS: 300.0000 mg | ORAL_CAPSULE | Freq: Every day | ORAL | Status: DC

## 2018-11-24 MED ORDER — INSULIN ASPART 100 UNIT/ML ~~LOC~~ SOLN
0.0000 [IU] | Freq: Every day | SUBCUTANEOUS | Status: DC
  Administered 2018-11-24 – 2018-11-26 (×3): 2 [IU] via SUBCUTANEOUS

## 2018-11-24 MED ORDER — DIVALPROEX SODIUM ER 500 MG PO TB24
500.0000 mg | ORAL_TABLET | Freq: Every day | ORAL | Status: DC
  Administered 2018-11-25 – 2018-11-27 (×3): 500 mg via ORAL
  Filled 2018-11-24 (×3): qty 1

## 2018-11-24 MED ORDER — ATORVASTATIN CALCIUM 80 MG PO TABS
80.0000 mg | ORAL_TABLET | Freq: Every day | ORAL | Status: DC
  Administered 2018-11-25 – 2018-11-27 (×3): 80 mg via ORAL
  Filled 2018-11-24 (×3): qty 1

## 2018-11-24 MED ORDER — SODIUM CHLORIDE 0.9 % IV SOLN
1.0000 g | Freq: Once | INTRAVENOUS | Status: AC
  Administered 2018-11-24: 1 g via INTRAVENOUS
  Filled 2018-11-24: qty 10

## 2018-11-24 MED ORDER — ACETAMINOPHEN 325 MG PO TABS
650.0000 mg | ORAL_TABLET | ORAL | Status: DC | PRN
  Administered 2018-11-24: 650 mg via ORAL
  Filled 2018-11-24: qty 2

## 2018-11-24 MED ORDER — EZETIMIBE 10 MG PO TABS
10.0000 mg | ORAL_TABLET | Freq: Every day | ORAL | Status: DC
  Administered 2018-11-25 – 2018-11-27 (×3): 10 mg via ORAL
  Filled 2018-11-24 (×3): qty 1

## 2018-11-24 MED ORDER — SODIUM CHLORIDE 0.9 % IV BOLUS
500.0000 mL | Freq: Once | INTRAVENOUS | Status: AC
  Administered 2018-11-24: 500 mL via INTRAVENOUS

## 2018-11-24 MED ORDER — SODIUM CHLORIDE 0.9 % IV SOLN
1.0000 g | INTRAVENOUS | Status: DC
  Administered 2018-11-25 – 2018-11-26 (×2): 1 g via INTRAVENOUS
  Filled 2018-11-24 (×2): qty 1
  Filled 2018-11-24: qty 10

## 2018-11-24 MED ORDER — ALLOPURINOL 300 MG PO TABS
300.0000 mg | ORAL_TABLET | Freq: Every day | ORAL | Status: DC
  Administered 2018-11-25 – 2018-11-27 (×3): 300 mg via ORAL
  Filled 2018-11-24 (×3): qty 1

## 2018-11-24 MED ORDER — ENOXAPARIN SODIUM 40 MG/0.4ML ~~LOC~~ SOLN
40.0000 mg | Freq: Every day | SUBCUTANEOUS | Status: DC
  Administered 2018-11-25 – 2018-11-27 (×3): 40 mg via SUBCUTANEOUS
  Filled 2018-11-24 (×3): qty 0.4

## 2018-11-24 MED ORDER — SODIUM CHLORIDE 0.9 % IV SOLN
INTRAVENOUS | Status: AC
  Administered 2018-11-24: 23:00:00 via INTRAVENOUS

## 2018-11-24 MED ORDER — NIACIN ER (ANTIHYPERLIPIDEMIC) 500 MG PO TBCR
500.0000 mg | EXTENDED_RELEASE_TABLET | Freq: Every day | ORAL | Status: DC
  Administered 2018-11-24 – 2018-11-26 (×3): 500 mg via ORAL
  Filled 2018-11-24 (×6): qty 1

## 2018-11-24 MED ORDER — INSULIN ASPART 100 UNIT/ML ~~LOC~~ SOLN
0.0000 [IU] | Freq: Three times a day (TID) | SUBCUTANEOUS | Status: DC
  Administered 2018-11-25 (×2): 2 [IU] via SUBCUTANEOUS
  Administered 2018-11-25: 3 [IU] via SUBCUTANEOUS
  Administered 2018-11-26: 2 [IU] via SUBCUTANEOUS
  Administered 2018-11-26 (×2): 5 [IU] via SUBCUTANEOUS
  Administered 2018-11-27: 2 [IU] via SUBCUTANEOUS

## 2018-11-24 MED ORDER — CLOPIDOGREL BISULFATE 75 MG PO TABS
75.0000 mg | ORAL_TABLET | Freq: Every day | ORAL | Status: DC
  Administered 2018-11-25 – 2018-11-27 (×3): 75 mg via ORAL
  Filled 2018-11-24 (×3): qty 1

## 2018-11-24 NOTE — Progress Notes (Signed)
New Admission Note:  Arrival Method: Via stretcher from ED Mental Orientation: Alert and Oriented x2 Telemetry: CCMD verified Assessment: Completed Skin: Refer to flowsheet IV: Left Hand & Right AC Pain: 0/10 Safety Measures: Safety Fall Prevention Plan discussed with patient. Admission: Completed 5 [place] Orientation: Patient has been orientated to the room, unit and the staff. Family: Daughter at the bedside   Orders have been reviewed and are being implemented. Will continue to monitor the patient. Call light has been placed within reach and bed alarm has been activated.   Vassie Moselle, RN  Phone Number: [phone]

## 2018-11-24 NOTE — ED Notes (Signed)
Condom catheter placed x3, wife informs [place] that he continues to pull it off.

## 2018-11-24 NOTE — ED Notes (Signed)
Condom cath placed.

## 2018-11-24 NOTE — H&P (Signed)
History and Physical    Fred [name]:[id number] DOB: 06-23-1935 DOA: 11/24/2018  PCP: [name], MD Patient coming from: Home  Chief Complaint: Generalized weakness  HPI: Fred [name] is a 83 y.o. male with medical history significant of postop A. 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  EMS was originally called out for lifting assist.  Upon arrival family they noted patient was weaker than normal.  Daughter at bedside states patient was very weak this morning and fell at home.  He did not injure his head.  He scraped his left knee against the wheelchair.  He is not complaining of pain in that knee.  He has not been having any fevers or chills.  No cough or shortness of breath.  Patient denies chest pain.  Denies nausea, vomiting, abdominal pain, or dysuria.  He has no other complaints.  ED Course: Slightly tachycardic and tachypneic.  SARS-CoV-2 test negative.  Temperature 101.6 F.  White count 22.6 with left shift.  Lactic acid 2.9.  Blood glucose 250.  Creatinine 1.3, was 1.1 in January 2020.  UA with moderate amount of leukocytes, 21-50 WBCs, and many bacteria on microscopic examination.  Urine culture pending.  Blood culture x2 pending.  Chest x-ray not suggestive of pneumonia.  Head CT negative for acute finding. Patient received ceftriaxone and a 500 cc normal saline bolus in the ED.  Review of Systems:  All systems reviewed and apart from history of presenting illness, are negative.  Past Medical History:  Diagnosis Date  . Atrial fibrillation ([place])    Post operative  . CAD (coronary artery disease)    s/p cabg  . CHF (congestive heart failure) ([place])   . Diverticulosis   . DJD (degenerative joint  disease)   . DM (diabetes mellitus) ([place])   . History of anemia   . History of stroke   . Hyperlipidemia   . Hypertension   . 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 Peripheral vascular disease ([place])   . Polyneuropathy, diabetic ([place])   . Seizure disorder, complex partial ([place])   . Syncope and collapse     Past Surgical History:  Procedure Laterality Date  . CATARACT EXTRACTION W/ INTRAOCULAR LENS  IMPLANT, BILATERAL  06/2013   Dr. [name]  . CORONARY ARTERY BYPASS GRAFT  02/13/2004   4 vesel  . removal of sebaceous cyst from neck  08/2007   Dr. [name]  . TONSILLECTOMY       reports that he quit smoking about 41 years ago. His smoking use included cigarettes. He has a 22.00 pack-year smoking history. He has never used smokeless tobacco. He reports previous alcohol use. He reports that he does not use drugs.  Allergies  Allergen Reactions  . Lisinopril Cough    Family History  Problem Relation Age of Onset  . Epilepsy Father   . Stroke Mother   . Lung cancer Other     Prior to Admission medications   Medication Sig Start Date End Date Taking? Authorizing Provider  acetaminophen (TYLENOL) 325 MG tablet Take 2 tablets (650 mg total) by mouth every 4 (four) hours as needed for mild pain (or temp > 37.5 C (99.5 F)). 09/13/17  Yes Angiulli, Lavon Paganini, PA-C  allopurinol (ZYLOPRIM) 300 MG tablet Take 1  tablet (300 mg total) by mouth daily. 07/30/18  Yes [name], MD  atorvastatin (LIPITOR) 80 MG tablet Take 1 tablet (80 mg total) by mouth daily. 11/14/18  Yes [name], MD  clopidogrel (PLAVIX) 75 MG tablet Take 1 tablet (75 mg total) by mouth daily. 12/13/17  Yes Cameron Sprang, MD  Coenzyme Q10 (COQ10) 100 MG CAPS Take 300 mg by mouth daily.    Yes [provider]  divalproex (DEPAKOTE ER) 500 MG 24 hr tablet Take 1 tablet (500 mg total) by mouth daily. 08/01/18  Yes Cameron Sprang, MD  ezetimibe (ZETIA) 10 MG tablet Take 1  tablet (10 mg total) by mouth daily. 11/23/18  Yes Silverio Decamp, MD  furosemide (LASIX) 20 MG tablet Take 1 tablet (20 mg total) by mouth daily. Patient taking differently: Take 20 mg by mouth 3 (three) times a week.  07/30/18  Yes [name], MD  Insulin Glargine, 1 Unit Dial, (TOUJEO SOLOSTAR) 300 UNIT/ML SOPN Inject 10-20 Units into the skin at bedtime. 10/26/18  Yes [name], MD  losartan (COZAAR) 25 MG tablet TAKE 1 TABLET BY MOUTH DAILY Patient taking differently: Take 25 mg by mouth daily.  11/12/18  Yes [name], MD  metFORMIN (GLUCOPHAGE) 500 MG tablet TAKE 1 TABLET BY MOUTH TWICE DAILY WITH A MEAL Patient taking differently: Take 500 mg by mouth 2 (two) times daily with a meal.  07/30/18  Yes [name], MD  metoprolol succinate (TOPROL-XL) 50 MG 24 hr tablet TAKE 1 TABLET BY MOUTH DAILY IMMEDIATELY FOLLOWING A MEAL Patient taking differently: Take 50 mg by mouth daily.  11/12/18  Yes [name], MD  niacin (NIASPAN) 500 MG CR tablet Take 1 tablet (500 mg total) by mouth at bedtime. 11/23/18  Yes Silverio Decamp, MD  senna-docusate (SENOKOT-S) 8.6-50 MG tablet Take 1 tablet by mouth 2 (two) times daily. Patient taking differently: Take 1 tablet by mouth as needed for mild constipation.  09/13/17  Yes Angiulli, Daniel J, PA-C  TRADJENTA 5 MG TABS tablet TAKE 1 TABLET BY MOUTH DAILY Patient taking differently: Take 5 mg by mouth daily.  11/21/18  Yes [name], MD  blood glucose meter kit and supplies Check blood sugar 3 times daily. 04/12/18   [name], MD  Insulin Pen Needle (NOVOFINE) 32G X 6 MM MISC Inject insulin daily 04/13/18   [name], MD  Lancets (ONETOUCH DELICA PLUS [id number]) [place]  04/12/18   [provider]  ONE TOUCH ULTRA TEST test strip  04/12/18   [provider]    Physical Exam: Vitals:   11/24/18 2030 11/24/18 2115 11/24/18 2221 11/24/18 2311  BP:  125/64 (!) 175/90 127/71 127/61  Pulse: (!) 101 (!) 107 (!) 108 (!) 105  Resp: 18 (!) 26 (!) 25 18  Temp:    (!) 101.6 F (38.7 C)  TempSrc:    Oral  SpO2: 95% 94% 95% 95%  Weight:      Height:        Physical Exam  Constitutional: He is oriented to person, place, and time. He appears well-developed and well-nourished. No distress.  HENT:  Head: Normocephalic.  Dry mucous membranes  Eyes: Right eye exhibits no discharge. Left eye exhibits no discharge.  Neck: Neck supple.  Cardiovascular: Normal rate, regular rhythm and intact distal pulses.  Pulmonary/Chest: Effort normal. No respiratory distress. He has no wheezes. He has no rales.  Abdominal: Soft.  Bowel sounds are normal. He exhibits no distension. There is no abdominal tenderness. There is no guarding.  Musculoskeletal:        General: No edema.     Comments: Left knee abrasion, no active bleeding.  Knee is not swollen and no tenderness.  Normal range of motion.  Neurological: He is alert and oriented to person, place, and time.  Skin: Skin is warm and dry. He is not diaphoretic.     Labs on Admission: I have personally reviewed following labs and imaging studies  CBC: Recent Labs  Lab 11/24/18 1409  WBC 22.6*  NEUTROABS 19.2*  HGB 13.8  HCT 40.5  MCV 96.0  PLT 035   Basic Metabolic Panel: Recent Labs  Lab 11/24/18 1409  NA 137  K 4.5  CL 103  CO2 23  GLUCOSE 250*  BUN 19  CREATININE 1.31*  CALCIUM 8.9   GFR: Estimated Creatinine Clearance: 40.1 mL/min (A) (by C-G formula based on SCr of 1.31 mg/dL (H)). Liver Function Tests: Recent Labs  Lab 11/24/18 1409  AST 15  ALT 18  ALKPHOS 60  BILITOT 1.2  PROT 6.4*  ALBUMIN 3.3*   No results for input(s): LIPASE, AMYLASE in the last 168 hours. No results for input(s): AMMONIA in the last 168 hours. Coagulation Profile: No results for input(s): INR, PROTIME in the last 168 hours. Cardiac Enzymes: No results for input(s): CKTOTAL, CKMB, CKMBINDEX,  TROPONINI in the last 168 hours. BNP (last 3 results) No results for input(s): PROBNP in the last 8760 hours. HbA1C: No results for input(s): HGBA1C in the last 72 hours. CBG: Recent Labs  Lab 11/24/18 1410 11/24/18 2310  GLUCAP 228* 211*   Lipid Profile: No results for input(s): CHOL, HDL, LDLCALC, TRIG, CHOLHDL, LDLDIRECT in the last 72 hours. Thyroid Function Tests: No results for input(s): TSH, T4TOTAL, FREET4, T3FREE, THYROIDAB in the last 72 hours. Anemia Panel: No results for input(s): VITAMINB12, FOLATE, FERRITIN, TIBC, IRON, RETICCTPCT in the last 72 hours. Urine analysis:    Component Value Date/Time   COLORURINE YELLOW 11/24/2018 1645   APPEARANCEUR HAZY (A) 11/24/2018 1645   LABSPEC 1.016 11/24/2018 1645   PHURINE 5.0 11/24/2018 1645   GLUCOSEU 150 (A) 11/24/2018 1645   GLUCOSEU NEGATIVE 05/16/2013 1009   HGBUR SMALL (A) 11/24/2018 1645   BILIRUBINUR NEGATIVE 11/24/2018 1645   BILIRUBINUR Negative 04/18/2018 1020   KETONESUR 5 (A) 11/24/2018 1645   PROTEINUR 30 (A) 11/24/2018 1645   UROBILINOGEN 1.0 04/18/2018 1020   UROBILINOGEN 0.2 05/16/2013 1009   NITRITE NEGATIVE 11/24/2018 1645   LEUKOCYTESUR MODERATE (A) 11/24/2018 1645    Radiological Exams on Admission: Dg Chest 2 View  Result Date: 11/24/2018 CLINICAL DATA:  Weakness. EXAM: CHEST - 2 VIEW COMPARISON:  Chest radiograph 03/19/2018 FINDINGS: Monitoring leads overlie the patient. Stable cardiac and mediastinal contours status post median sternotomy. Low lung volumes. Bibasilar atelectasis. Lingular scarring. No pleural effusion or pneumothorax. Thoracic spine degenerative changes. IMPRESSION: Low lung volumes with basilar atelectasis. Electronically Signed   By: Lovey Newcomer M.D.   On: 11/24/2018 14:54   Ct Head Wo Contrast  Result Date: 11/24/2018 CLINICAL DATA:  Altered mental status. EXAM: CT HEAD WITHOUT CONTRAST TECHNIQUE: Contiguous axial images were obtained from the base of the skull through the  vertex without intravenous contrast. COMPARISON:  08/09/2017 FINDINGS: Brain: Ventricles and cisterns are within normal. CSF spaces are within normal. There is moderate chronic ischemic microvascular disease unchanged. Old lacunar infarct posterior limb right internal capsule. No  mass, mass effect, shift of midline structures or acute hemorrhage. No acute infarction. Vascular: No hyperdense vessel or unexpected calcification. Skull: Normal. Negative for fracture or focal lesion. Sinuses/Orbits: No acute finding. Other: None. IMPRESSION: No acute findings. Moderate chronic ischemic microvascular disease. Small old right-sided lacunar infarct. Electronically Signed   By: Marin Olp M.D.   On: 11/24/2018 14:51    EKG: Independently reviewed.  Possible atrial flutter, heart rate 88.  Assessment/Plan Principal Problem:   UTI (urinary tract infection) Active Problems:   Essential hypertension   Type 2 diabetes mellitus with complication, with long-term current use of insulin (HCC)   Stroke (cerebrum) (HCC)   Sepsis ([place])   Sepsis secondary to UTI Slightly tachycardic and tachypneic.  Temperature 101.6 F white count 22.6 with left shift.  Lactic acid 2.9. UA with moderate amount of leukocytes, 21-50 WBCs, and many bacteria on microscopic examination. -IV fluid -Ceftriaxone -Urine culture pending -Blood culture x2 pending -Continue to trend lactate -Continue to monitor white count -Tylenol PRN  ?Atrial flutter EKG with possible atrial flutter, heart rate 88.  Per cardiology documentation patient has a prior history of postop A. fib and is currently not on anticoagulation. -Repeat EKG  Physical deconditioning -PT evaluation  Insulin-dependent diabetes mellitus Blood glucose 250. -Check A1c.  Sliding scale insulin sensitive with bedtime coverage and CBG checks.  Hypertension -We will hold off giving antihypertensives at this time in the setting of sepsis  CVA with residual hemiparesis  affecting left side -Continue home Plavix  Seizure disorder -Continue home Depakote  Hyperlipidemia -Continue home Lipitor, Zetia  Gout -Continue allopurinol  DVT prophylaxis: Lovenox Code Status: Patient wishes to be full code. Family Communication: Daughter at bedside. Disposition Plan: Discharge after clinical improvement. Consults called: None Admission status: It is my clinical opinion that admission to INPATIENT is reasonable and necessary in this 83 y.o. male . presenting with sepsis secondary to UTI.  Anticipate he will need IV antibiotic for 24 to 48 hours.  Will need PT evaluation for generalized weakness and possible SNF placement.  Given the aforementioned, the predictability of an adverse outcome is felt to be significant. I expect that the patient will require at least 2 midnights in the hospital to treat this condition.   The medical decision making on this patient was of high complexity and the patient is at high risk for clinical deterioration, therefore this is a level 3 visit.  Shela Leff MD [facility] Pager [phone]  If 7PM-7AM, please contact night-coverage [email or web address] Password [facility]  11/24/2018, 11:55 PM

## 2018-11-24 NOTE — Progress Notes (Signed)

## 2018-11-24 NOTE — ED Triage Notes (Signed)
Pt BIB [facility] from home. Per EMS originally called out for lifting assist. Upon arrival of family, they noted pt weaker than normal. Pt has history of prior stroke and TIA with left sided deficits. Pt generally weak. Pt also incontinent of urine which is new for him. Pt last known normal 2200 yesterday.

## 2018-11-24 NOTE — ED Notes (Signed)
Patient transported to CT 

## 2018-11-24 NOTE — ED Provider Notes (Signed)
[facility] EMERGENCY DEPARTMENT Provider Note   CSN: [id number] Arrival date & time: 11/24/18  1332     History   Chief Complaint Chief Complaint  Patient presents with   Weakness    HPI Fred [name] is a 83 y.o. male.     Pt presents to the ED today with generalized weakness.  The pt has a hx of stroke and is nonambulatory.  The pt's family said he was weaker than nl, but pt said he feels ok.       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    Peripheral vascular disease (HCC)    Polyneuropathy, diabetic ([place])    Seizure disorder, complex partial ([place])    Syncope and collapse     Patient Active Problem List   Diagnosis Date Noted   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[place]) 09/25/2017   Chronic diastolic (congestive) heart failure (HCC)    Hemiparesis affecting left side as late effect of cerebrovascular accident ([place])    Diabetes mellitus type 2 in obese ([place])    Small vessel disease ([place]) 08/11/2017   Left hemiparesis (HCC)    CVA (cerebral vascular accident) ([place]) 08/10/2017   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 of localized onset, not intractable, without status epilepticus ([place]) 06/20/2017   Primary osteoarthritis of both knees 05/11/2017   Stroke (cerebrum) ([place]) 03/21/2017   Type 2 diabetes mellitus with complication, with long-term current use of insulin ([place]) 03/27/2015   Atherosclerosis of native coronary artery of native heart without angina pectoris 03/27/2015   Palpitations 11/04/2014   S/P  CABG (coronary artery bypass graft) 11/04/2014   Atrial fibrillation, unspecified    History of stroke 09/04/2014   Obesity (BMI 30-39.9) 11/29/2013   History of gout 05/16/2013   CHF, chronic (HCC) 04/20/2009   CARDIOVASCULAR FUNCTION STUDY, ABNORMAL 01/29/2009   NECK PAIN 08/03/2007   ANEMIA / OTHER 05/16/2007   Coronary atherosclerosis 05/16/2007   PAROXYSMAL ATRIAL FIBRILLATION 05/16/2007   Subcortical infarction ([place]) 05/16/2007   Hyperlipidemia 03/30/2007   DEGENERATIVE JOINT DISEASE 03/30/2007   PAROTID LESION, UNSPECIFIED 09/29/2006   Partial epilepsy with impairment of consciousness ([place]) 09/29/2006   Diabetic polyneuropathy ([place]) 09/29/2006   Essential hypertension 09/29/2006   PERIPHERAL VASCULAR DISEASE 09/29/2006   DIVERTICULOSIS 09/29/2006    Past Surgical History:  Procedure Laterality Date   CATARACT EXTRACTION W/ INTRAOCULAR LENS  IMPLANT, BILATERAL  06/2013   Dr. [name]   CORONARY ARTERY BYPASS GRAFT  02/13/2004   4 vesel   removal of sebaceous cyst from neck  08/2007   Dr. [name]   TONSILLECTOMY          Home Medications    Prior to Admission medications   Medication Sig Start Date End Date Taking? Authorizing Provider  acetaminophen (TYLENOL) 325 MG tablet Take 2 tablets (650 mg total) by mouth every 4 (four) hours as needed for mild pain (or temp > 37.5 C (99.5 F)). 09/13/17   Angiulli, Lavon Paganini, PA-C  allopurinol (ZYLOPRIM) 300 MG tablet  Take 1 tablet (300 mg total) by mouth daily. 07/30/18   [name], MD  atorvastatin (LIPITOR) 80 MG tablet Take 1 tablet (80 mg total) by mouth daily. 11/14/18   [name], MD  blood glucose meter kit and supplies Check blood sugar 3 times daily. 04/12/18   [name], MD  clopidogrel (PLAVIX) 75 MG tablet Take 1 tablet (75 mg total) by mouth daily. 12/13/17   Cameron Sprang, MD  Coenzyme Q10 (COQ10) 30 MG CAPS Take 1 tablet by mouth daily.      [provider]  diclofenac sodium (VOLTAREN) 1 % GEL Apply 2 g topically 4 (four) times daily. 01/11/18   [name], MD  divalproex (DEPAKOTE ER) 500 MG 24 hr tablet Take 1 tablet (500 mg total) by mouth daily. 08/01/18   Cameron Sprang, MD  ezetimibe (ZETIA) 10 MG tablet Take 1 tablet (10 mg total) by mouth daily. 11/23/18   Silverio Decamp, MD  furosemide (LASIX) 20 MG tablet Take 1 tablet (20 mg total) by mouth daily. 07/30/18   [name], MD  Insulin Glargine, 1 Unit Dial, (TOUJEO SOLOSTAR) 300 UNIT/ML SOPN Inject 10-20 Units into the skin at bedtime. 10/26/18   [name], MD  Insulin Pen Needle (NOVOFINE) 32G X 6 MM MISC Inject insulin daily 04/13/18   [name], MD  Lancets (ONETOUCH DELICA PLUS [id number]) [place]  04/12/18   [provider]  losartan (COZAAR) 25 MG tablet TAKE 1 TABLET BY MOUTH DAILY 11/12/18   [name], MD  metFORMIN (GLUCOPHAGE) 500 MG tablet TAKE 1 TABLET BY MOUTH TWICE DAILY WITH A MEAL 07/30/18   [name], MD  metoprolol succinate (TOPROL-XL) 50 MG 24 hr tablet TAKE 1 TABLET BY MOUTH DAILY IMMEDIATELY FOLLOWING A MEAL 11/12/18   [name], MD  niacin (NIASPAN) 500 MG CR tablet Take 1 tablet (500 mg total) by mouth at bedtime. 11/23/18   Silverio Decamp, MD  ONE TOUCH ULTRA TEST test strip  04/12/18   [provider]  senna-docusate (SENOKOT-S) 8.6-50 MG tablet Take 1 tablet by mouth 2 (two) times daily. Patient taking differently: Take 1 tablet by mouth as needed.  09/13/17   Angiulli, Lavon Paganini, PA-C  TRADJENTA 5 MG TABS tablet TAKE 1 TABLET BY MOUTH DAILY 11/21/18   [name], MD    Family History Family History  Problem Relation Age of Onset   Epilepsy Father    Stroke Mother    Lung cancer Other     Social History Social History   Tobacco Use   Smoking status: Former Smoker    Packs/day: 1.00    Years: 22.00    Pack years: 22.00     Types: Cigarettes    Quit date: 03/14/1977    Years since quitting: 41.7   Smokeless tobacco: Never Used  Substance Use Topics   Alcohol use: Not Currently    Comment: Pt has not had alcohol since 03/2017 CVA   Drug use: No     Allergies   Lisinopril   Review of Systems Review of Systems  Neurological: Positive for weakness.  All other systems reviewed and are negative.    Physical Exam Updated Vital Signs BP 118/71 (BP Location: Right Arm)    Temp 99.9 F (37.7 C) (Oral)    Resp 16    Ht '5\' 3"'$  (1.6 m)    Wt 74.8 kg    SpO2  96%    BMI 29.23 kg/m   Physical Exam Vitals signs and nursing note reviewed.  Constitutional:      Appearance: Normal appearance.  HENT:     Head: Normocephalic and atraumatic.     Right Ear: External ear normal.     Left Ear: External ear normal.     Nose: Nose normal.     Mouth/Throat:     Mouth: Mucous membranes are moist.     Pharynx: Oropharynx is clear.  Eyes:     Extraocular Movements: Extraocular movements intact.     Conjunctiva/sclera: Conjunctivae normal.     Pupils: Pupils are equal, round, and reactive to light.  Neck:     Musculoskeletal: Normal range of motion and neck supple.  Cardiovascular:     Rate and Rhythm: Normal rate and regular rhythm.     Pulses: Normal pulses.  Pulmonary:     Effort: Pulmonary effort is normal.     Breath sounds: Normal breath sounds.  Abdominal:     General: Abdomen is flat. Bowel sounds are normal.  Musculoskeletal: Normal range of motion.  Skin:    General: Skin is warm.     Capillary Refill: Capillary refill takes less than 2 seconds.  Neurological:     Mental Status: He is alert and oriented to person, place, and time.     Comments: Left sided weakness (chronic)  Psychiatric:        Mood and Affect: Mood normal.        Behavior: Behavior normal.      ED Treatments / Results  Labs (all labs ordered are listed, but only abnormal results are displayed) Labs Reviewed  CBC WITH  DIFFERENTIAL/PLATELET - Abnormal; Notable for the following components:      Result Value   WBC 22.6 (*)    Neutro Abs 19.2 (*)    Monocytes Absolute 1.9 (*)    Abs Immature Granulocytes 0.17 (*)    All other components within normal limits  COMPREHENSIVE METABOLIC PANEL - Abnormal; Notable for the following components:   Glucose, Bld 250 (*)    Creatinine, Ser 1.31 (*)    Total Protein 6.4 (*)    Albumin 3.3 (*)    GFR calc non Af Amer 51 (*)    GFR calc Af Amer 59 (*)    All other components within normal limits  VALPROIC ACID LEVEL - Abnormal; Notable for the following components:   Valproic Acid Lvl <10 (*)    All other components within normal limits  CBG MONITORING, ED - Abnormal; Notable for the following components:   Glucose-Capillary 228 (*)    All other components within normal limits  URINE CULTURE  CULTURE, BLOOD (ROUTINE X 2)  CULTURE, BLOOD (ROUTINE X 2)  SARS CORONAVIRUS 2 (HOSPITAL ORDER, [place])  URINALYSIS, ROUTINE W REFLEX MICROSCOPIC  LACTIC ACID, PLASMA  LACTIC ACID, PLASMA    EKG EKG Interpretation  Date/Time:  Saturday November 24 2018 13:57:24 EDT Ventricular Rate:  88 PR Interval:    QRS Duration: 102 QT Interval:  359 QTC Calculation: 435 R Axis:   -28 Text Interpretation:  Borderline left axis deviation Borderline low voltage, extremity leads Abnormal R-wave progression, late transition Confirmed by Isla Pence [phone]) on 11/24/2018 2:44:25 PM   Radiology Dg Chest 2 View  Result Date: 11/24/2018 CLINICAL DATA:  Weakness. EXAM: CHEST - 2 VIEW COMPARISON:  Chest radiograph 03/19/2018 FINDINGS: Monitoring leads overlie the patient. Stable cardiac and mediastinal  contours status post median sternotomy. Low lung volumes. Bibasilar atelectasis. Lingular scarring. No pleural effusion or pneumothorax. Thoracic spine degenerative changes. IMPRESSION: Low lung volumes with basilar atelectasis. Electronically Signed    By: Lovey Newcomer M.D.   On: 11/24/2018 14:54   Ct Head Wo Contrast  Result Date: 11/24/2018 CLINICAL DATA:  Altered mental status. EXAM: CT HEAD WITHOUT CONTRAST TECHNIQUE: Contiguous axial images were obtained from the base of the skull through the vertex without intravenous contrast. COMPARISON:  08/09/2017 FINDINGS: Brain: Ventricles and cisterns are within normal. CSF spaces are within normal. There is moderate chronic ischemic microvascular disease unchanged. Old lacunar infarct posterior limb right internal capsule. No mass, mass effect, shift of midline structures or acute hemorrhage. No acute infarction. Vascular: No hyperdense vessel or unexpected calcification. Skull: Normal. Negative for fracture or focal lesion. Sinuses/Orbits: No acute finding. Other: None. IMPRESSION: No acute findings. Moderate chronic ischemic microvascular disease. Small old right-sided lacunar infarct. Electronically Signed   By: Marin Olp M.D.   On: 11/24/2018 14:51    Procedures Procedures (including critical care time)  Medications Ordered in ED Medications  sodium chloride 0.9 % bolus 500 mL (500 mLs Intravenous New Bag/Given 11/24/18 1413)     Initial Impression / Assessment and Plan / ED Course  I have reviewed the triage vital signs and the nursing notes.  Pertinent labs & imaging results that were available during my care of the patient were reviewed by me and considered in my medical decision making (see chart for details).       WBC is elevated.  UA is pending at shift change.  Pt's daughter said she felt like there was a worsening left sided facial droop.  If urine nl, may consider a MRI.  Pt signed out to Dr. Wilson Singer at shift change.  Final Clinical Impressions(s) / ED Diagnoses   Final diagnoses:  Weakness    ED Discharge Orders    None       Isla Pence, MD 11/24/18 1527

## 2018-11-24 NOTE — ED Notes (Signed)
CBG is 228. 

## 2018-11-25 DIAGNOSIS — E119 Type 2 diabetes mellitus without complications: Secondary | ICD-10-CM

## 2018-11-25 DIAGNOSIS — A419 Sepsis, unspecified organism: Principal | ICD-10-CM

## 2018-11-25 DIAGNOSIS — E118 Type 2 diabetes mellitus with unspecified complications: Secondary | ICD-10-CM

## 2018-11-25 DIAGNOSIS — I6300 Cerebral infarction due to thrombosis of unspecified precerebral artery: Secondary | ICD-10-CM

## 2018-11-25 DIAGNOSIS — Z794 Long term (current) use of insulin: Secondary | ICD-10-CM

## 2018-11-25 LAB — CBC
HCT: 35.4 % — ABNORMAL LOW (ref 39.0–52.0)
Hemoglobin: 12.1 g/dL — ABNORMAL LOW (ref 13.0–17.0)
MCH: 32.7 pg (ref 26.0–34.0)
MCHC: 34.2 g/dL (ref 30.0–36.0)
MCV: 95.7 fL (ref 80.0–100.0)
Platelets: 180 10*3/uL (ref 150–400)
RBC: 3.7 MIL/uL — ABNORMAL LOW (ref 4.22–5.81)
RDW: 13.2 % (ref 11.5–15.5)
WBC: 22.9 10*3/uL — ABNORMAL HIGH (ref 4.0–10.5)
nRBC: 0 % (ref 0.0–0.2)

## 2018-11-25 LAB — BASIC METABOLIC PANEL
Anion gap: 8 (ref 5–15)
BUN: 18 mg/dL (ref 8–23)
CO2: 23 mmol/L (ref 22–32)
Calcium: 8.5 mg/dL — ABNORMAL LOW (ref 8.9–10.3)
Chloride: 107 mmol/L (ref 98–111)
Creatinine, Ser: 1.1 mg/dL (ref 0.61–1.24)
GFR calc Af Amer: >60 mL/min (ref >60)
GFR calc non Af Amer: >60 mL/min (ref >60)
Glucose, Bld: 188 mg/dL — ABNORMAL HIGH (ref 70–99)
Potassium: 3.9 mmol/L (ref 3.5–5.1)
Sodium: 138 mmol/L (ref 135–145)

## 2018-11-25 LAB — LACTIC ACID, PLASMA: Lactic Acid, Venous: 1.6 mmol/L (ref 0.5–1.9)

## 2018-11-25 LAB — GLUCOSE, CAPILLARY
Glucose-Capillary: 186 mg/dL — ABNORMAL HIGH (ref 70–99)
Glucose-Capillary: 212 mg/dL — ABNORMAL HIGH (ref 70–99)
Glucose-Capillary: 184 mg/dL — ABNORMAL HIGH (ref 70–99)
Glucose-Capillary: 230 mg/dL — ABNORMAL HIGH (ref 70–99)

## 2018-11-25 LAB — VALPROIC ACID LEVEL: Valproic Acid Lvl: <10 ug/mL — ABNORMAL LOW (ref 50.0–100.0)

## 2018-11-25 NOTE — Evaluation (Signed)
Physical Therapy Evaluation Patient Details Name: Fred [name] MRN: [id number] DOB: 02-03-1936 Today's Date: 11/25/2018   History of Present Illness  Fred [name] is a 83 y.o. male with medical history significant of postop A. 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  Clinical Impression   Pt admitted with above diagnosis. Comes form home, where he was dependent on power chair for mobility; Tells me his wife was able to assist with transfers, and has lots of family assist with ADLs; was working with [facility] services prior to admission; Presents to PT with generalized weakness (in setting of L sided weakness from previous CVA), decr functional mobility, needing 2 person assist to safely trnasfers, which is a functional decline from baseline; Pt currently with functional limitations due to the deficits listed below (see PT Problem List). Pt will benefit from skilled PT to increase their independence and safety with mobility to allow discharge to the venue listed below.       Follow Up Recommendations Home health PT;Supervision/Assistance - 24 hour;Other (comment)(I believe he is/was active with [facility], but I lack detail; it is worth considering [facility] for rehab-oriented [place])    Equipment Recommendations  Other (comment)(Needs power chair repaired)    Recommendations for Other Services       Precautions / Restrictions Precautions Precautions: Fall      Mobility  Bed Mobility Overal bed mobility: Needs Assistance Bed Mobility: Supine to Sit     Supine to sit: Mod assist     General bed mobility comments: Assist to move LLE towards EOB; noted good use of RLE to half-bridge hips to EOB; Heavy mod  handheld assist to pull to sit  Transfers Overall transfer level: Needs assistance Equipment used: 2 person hand held assist(and bil support at gait belt) Transfers: Stand Pivot Transfers   Stand pivot transfers: Mod assist;+2 safety/equipment       General transfer comment: Mod assist of 2 to power up and then pivot to chair placed on pt's stronger R side  Ambulation/Gait                Stairs            Wheelchair Mobility    Modified Rankin (Stroke Patients Only)       Balance Overall balance assessment: Needs assistance   Sitting balance-Leahy Scale: Fair(approaching good)       Standing balance-Leahy Scale: Poor                               Pertinent Vitals/Pain Pain Assessment: No/denies pain    Home Living Family/patient expects to be discharged to:: Private residence Living Arrangements: Spouse/significant other;Children Available Help at Discharge: Family;Available 24 hours/day Type of Home: House Home Access: Ramped entrance     Home Layout: Two level;Able to live on main level with bedroom/bathroom Home Equipment: Cane - quad;Tub bench;Wheelchair - power Additional Comments: Power chair needs repair    Prior Function Level of Independence: Needs assistance   Gait / Transfers Assistance Needed: Hasn't walked since CVA; assist for transfers; Power chair fo rmobility  ADL's / Homemaking Assistance Needed: Assist; grandson comes over and helps with evening ADLs and helps him into bed (daily)  Hand Dominance   Dominant Hand: Right    Extremity/Trunk Assessment   Upper Extremity Assessment Upper Extremity Assessment: LUE deficits/detail(L hemiparesis) LUE Deficits / Details: Noted some voluntary active shoulder flexion, elbow flex/ext, gross grip closing and opening; slow moving; noted incr tone with passive motion    Lower Extremity Assessment Lower Extremity Assessment: LLE deficits/detail;RLE  deficits/detail RLE Deficits / Details: Grossly 4+/5 throughout  LLE Deficits / Details: Minimal to no voluntary movement noted       Communication      Cognition Arousal/Alertness: Awake/alert Behavior During Therapy: WFL for tasks assessed/performed(emotionally labile) Overall Cognitive Status: Within Functional Limits for tasks assessed(for simple mobility tasks)                                        General Comments      Exercises     Assessment/Plan    PT Assessment Patient needs continued PT services  PT Problem List Decreased strength;Decreased range of motion;Decreased activity tolerance;Decreased balance;Decreased mobility;Decreased coordination;Decreased cognition;Decreased knowledge of use of DME;Decreased safety awareness;Decreased knowledge of precautions;Impaired tone       PT Treatment Interventions DME instruction;Gait training;Functional mobility training;Therapeutic activities;Therapeutic exercise;Balance training;Neuromuscular re-education;Cognitive remediation;Patient/family education;Wheelchair mobility training    PT Goals (Current goals can be found in the Care Plan section)  Acute Rehab PT Goals Patient Stated Goal: did not specifically state, but agreeable to getting OOB PT Goal Formulation: With patient Time For Goal Achievement: 12/09/18 Potential to Achieve Goals: Good    Frequency Min 3X/week   Barriers to discharge        Co-evaluation               AM-PAC PT "6 Clicks" Mobility  Outcome Measure Help needed turning from your back to your side while in a flat bed without using bedrails?: A Little Help needed moving from lying on your back to sitting on the side of a flat bed without using bedrails?: A Lot Help needed moving to and from a bed to a chair (including a wheelchair)?: A Lot Help needed standing up from a chair using your arms (e.g., wheelchair or bedside chair)?: A Lot Help needed to walk in hospital  room?: Total Help needed climbing 3-5 steps with a railing? : Total 6 Click Score: 11    End of Session Equipment Utilized During Treatment: Gait belt Activity Tolerance: Patient tolerated treatment well Patient left: in chair;with call bell/phone within reach;with family/visitor present;with chair alarm set Nurse Communication: Mobility status PT Visit Diagnosis: Other abnormalities of gait and mobility (R26.89);Hemiplegia and hemiparesis;Muscle weakness (generalized) (M62.81);Repeated falls (R29.6);History of falling (Z91.81) Hemiplegia - Right/Left: Left Hemiplegia - dominant/non-dominant: Non-dominant Hemiplegia - caused by: Cerebral infarction    Time: [id number] PT Time Calculation (min) (ACUTE ONLY): 20 min   Charges:   PT Evaluation $PT Eval Moderate Complexity: 1 Mod          Roney [place], [place]  Acute Rehabilitation Services Pager [phone] Office [phone]   [place] 11/25/2018, 10:55 AM

## 2018-11-25 NOTE — Progress Notes (Signed)
Pt temp-101.6. b/p-126/61, pulse-105, O2-955 RA. Mews score-3. Notified on call provider. Tylenol given. RN will continue to monitor.

## 2018-11-25 NOTE — Progress Notes (Signed)
PROGRESS NOTE    Fred [name]  [id number]  DOB: October 25, 1935  DOA: 11/24/2018 PCP: Fred Marry, MD  Brief Narrative:  83 y.o. male with medical history significant of postop A. 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  EMS was originally called for lifting assist.  Upon their arrival family reported that patient was weaker than normal. He scraped his left knee against the wheelchair but did not injure his head.  ED Course: Slightly tachycardic and tachypneic.  SARS-CoV-2 test negative.  Temperature 101.6 F.  White count 22.6 with left shift.  Lactic acid 2.9.  Blood glucose 250.  Creatinine 1.3, was 1.1 in January 2020.  UA with moderate amount of leukocytes, 21-50 WBCs, and many bacteria on microscopic examination. Chest x-ray not suggestive of pneumonia.  Head CT negative for acute finding. Patient received ceftriaxone and a 500 cc normal saline bolus in the ED and now admitted to hospitalist service with IV antibiotics for urosepsis/metabolic encephalopathy.Urine cultures and blood cultures sent.  Subjective:  Patient sitting comfortably in bedside chair. Daughter bedside. Patient reports feeling much better today. Denies any fall at home. Apparently felt weak in his legs and slid down to the floor, scratched his leg in the process. Has dense left hemiplegia at baseline and uses scooter   Objective: Vitals:   11/24/18 2221 11/24/18 2311 11/25/18 0127 11/25/18 0424  BP: 127/71 127/61 (!) 98/54 (!) 124/56  Pulse: (!) 108 (!) 105 87 100  Resp: (!) 25 18 18 18   Temp:  (!) 101.6 F (38.7 C) 98.4 F (36.9 C) 99.1 F (37.3 C)  TempSrc:  Oral Oral Oral  SpO2: 95% 95% 92% 95%  Weight:      Height:         Intake/Output Summary (Last 24 hours) at 11/25/2018 0858 Last data filed at 11/25/2018 0601 Gross per 24 hour  Intake 1751.86 ml  Output -  Net 1751.86 ml   Filed Weights   11/24/18 1342  Weight: 74.8 kg    Physical Examination:  General exam: Appears calm and comfortable  Respiratory system: Clear to auscultation. Respiratory effort normal. Cardiovascular system: S1 & S2 heard, RRR. No JVD, murmurs. No pedal edema. Gastrointestinal system: Abdomen is nondistended, soft and nontender. No organomegaly or masses felt. Normal bowel sounds heard. Central nervous system: Alert and oriented.Dense left hemiplegia on left side Extremities: Symmetric 5 x 5 power. Skin: superficial small abrasions along left knee/anterior aspect of lower leg Psychiatry: Judgement and insight appear normal. Mood & affect appropriate.     Data Reviewed: I have personally reviewed following labs and imaging studies  CBC: Recent Labs  Lab 11/24/18 1409 11/25/18 0158  WBC 22.6* 22.9*  NEUTROABS 19.2*  --   HGB 13.8 12.1*  HCT 40.5 35.4*  MCV 96.0 95.7  PLT 201 [id number]   Basic Metabolic Panel: Recent Labs  Lab 11/24/18 1409 11/25/18 0158  NA 137 138  K 4.5 3.9  CL 103 107  CO2 23 23  GLUCOSE 250* 188*  BUN 19 18  CREATININE 1.31* 1.10  CALCIUM 8.9 8.5*   GFR: Estimated Creatinine Clearance: 47.8 mL/min (by C-G formula based on SCr of 1.1 mg/dL). Liver Function Tests: Recent Labs  Lab 11/24/18 1409  AST 15  ALT 18  ALKPHOS 60  BILITOT 1.2  PROT 6.4*  ALBUMIN 3.3*   No results for input(s): LIPASE, AMYLASE in the last 168 hours. No results for input(s): AMMONIA in the last 168 hours. Coagulation Profile: No results for input(s): INR, PROTIME in the last 168 hours. Cardiac Enzymes: No results for input(s): CKTOTAL, CKMB, CKMBINDEX, TROPONINI in the last 168 hours. BNP (last 3 results) No results for input(s): PROBNP in the last 8760 hours. HbA1C: No results for input(s): HGBA1C in  the last 72 hours. CBG: Recent Labs  Lab 11/24/18 1410 11/24/18 2310 11/25/18 0714  GLUCAP 228* 211* 186*   Lipid Profile: No results for input(s): CHOL, HDL, LDLCALC, TRIG, CHOLHDL, LDLDIRECT in the last 72 hours. Thyroid Function Tests: No results for input(s): TSH, T4TOTAL, FREET4, T3FREE, THYROIDAB in the last 72 hours. Anemia Panel: No results for input(s): VITAMINB12, FOLATE, FERRITIN, TIBC, IRON, RETICCTPCT in the last 72 hours. Sepsis Labs: Recent Labs  Lab 11/24/18 1552 11/24/18 1645 11/25/18 0158  LATICACIDVEN 1.6 2.9* 1.6    Recent Results (from the past 240 hour(s))  SARS Coronavirus 2 [facility] order, Performed in [facility] hospital lab) Nasopharyngeal Nasopharyngeal Swab     Status: None   Collection Time: 11/24/18  2:47 PM   Specimen: Nasopharyngeal Swab  Result Value Ref Range Status   SARS Coronavirus 2 NEGATIVE NEGATIVE Final    Comment: (NOTE) If result is NEGATIVE SARS-CoV-2 target nucleic acids are NOT DETECTED. The SARS-CoV-2 RNA is generally detectable in upper and lower  respiratory specimens during the acute phase of infection. The lowest  concentration of SARS-CoV-2 viral copies this assay can detect is 250  copies / mL. A negative result does not preclude SARS-CoV-2 infection  and should not be used as the sole basis for treatment or other  patient management decisions.  A negative result may occur with  improper specimen collection / handling, submission of specimen other  than nasopharyngeal swab, presence of viral mutation(s) within the  areas targeted by this assay, and inadequate number of viral copies  (<250 copies / mL). A negative result must be combined with clinical  observations, patient history, and epidemiological information. If result is POSITIVE SARS-CoV-2 target nucleic acids are DETECTED. The SARS-CoV-2 RNA is generally detectable in upper and lower  respiratory specimens dur ing the acute phase of infection.  Positive   results are indicative of active infection with SARS-CoV-2.  Clinical  correlation with patient history and other diagnostic information is  necessary to determine patient infection status.  Positive results do  not rule out bacterial infection or co-infection with other viruses. If result is PRESUMPTIVE POSTIVE SARS-CoV-2 nucleic acids MAY BE PRESENT.   A presumptive positive result was obtained on the submitted specimen  and confirmed on repeat testing.  While 2019 novel coronavirus  (SARS-CoV-2) nucleic acids may be present in the submitted sample  additional confirmatory testing may be necessary for epidemiological  and / or clinical management purposes  to differentiate between  SARS-CoV-2 and other Sarbecovirus currently known to infect humans.  If clinically indicated additional testing with an alternate test  methodology [phone]) is advised. The SARS-CoV-2 RNA is generally  detectable in upper and lower respiratory sp ecimens during the acute  phase of infection. The expected result is Negative. Fact Sheet for Patients:  StrictlyIdeas.no Fact Sheet for Healthcare Providers: BankingDealers.co.za This test is not yet approved or cleared by the [place] FDA and has been authorized for detection and/or diagnosis of SARS-CoV-2 by FDA under an Emergency Use Authorization (EUA).  This EUA will  remain in effect (meaning this test can be used) for the duration of the COVID-19 declaration under Section 564(b)(1) of the Act, 21 U.S.C. section 360bbb-3(b)(1), unless the authorization is terminated or revoked sooner. Performed at [facility], [place] [address], [place], [place] [address]   Culture, blood (routine x 2)     Status: None (Preliminary result)   Collection Time: 11/24/18  3:52 PM   Specimen: BLOOD RIGHT HAND  Result Value Ref Range Status   Specimen Description BLOOD RIGHT HAND  Final   Special Requests   Final     BOTTLES DRAWN AEROBIC AND ANAEROBIC Blood Culture adequate volume   Culture   Final    NO GROWTH < 24 HOURS Performed at [facility], [place] [address], [place], [place] [address]    Report Status PENDING  Incomplete  Culture, blood (routine x 2)     Status: None (Preliminary result)   Collection Time: 11/24/18  6:07 PM   Specimen: BLOOD RIGHT WRIST  Result Value Ref Range Status   Specimen Description BLOOD RIGHT WRIST  Final   Special Requests   Final    BOTTLES DRAWN AEROBIC AND ANAEROBIC Blood Culture results may not be optimal due to an inadequate volume of blood received in culture bottles   Culture   Final    NO GROWTH < 24 HOURS Performed at [facility], [place] [address] [place], [place] [address]    Report Status PENDING  Incomplete      Radiology Studies: Dg Chest 2 View  Result Date: 11/24/2018 CLINICAL DATA:  Weakness. EXAM: CHEST - 2 VIEW COMPARISON:  Chest radiograph 03/19/2018 FINDINGS: Monitoring leads overlie the patient. Stable cardiac and mediastinal contours status post median sternotomy. Low lung volumes. Bibasilar atelectasis. Lingular scarring. No pleural effusion or pneumothorax. Thoracic spine degenerative changes. IMPRESSION: Low lung volumes with basilar atelectasis. Electronically Signed   By: Lovey Newcomer M.D.   On: 11/24/2018 14:54   Ct Head Wo Contrast  Result Date: 11/24/2018 CLINICAL DATA:  Altered mental status. EXAM: CT HEAD WITHOUT CONTRAST TECHNIQUE: Contiguous axial images were obtained from the base of the skull through the vertex without intravenous contrast. COMPARISON:  08/09/2017 FINDINGS: Brain: Ventricles and cisterns are within normal. CSF spaces are within normal. There is moderate chronic ischemic microvascular disease unchanged. Old lacunar infarct posterior limb right internal capsule. No mass, mass effect, shift of midline structures or acute hemorrhage. No acute infarction. Vascular: No hyperdense vessel or unexpected  calcification. Skull: Normal. Negative for fracture or focal lesion. Sinuses/Orbits: No acute finding. Other: None. IMPRESSION: No acute findings. Moderate chronic ischemic microvascular disease. Small old right-sided lacunar infarct. Electronically Signed   By: Marin Olp M.D.   On: 11/24/2018 14:51        Scheduled Meds: . allopurinol  300 mg Oral Daily  . atorvastatin  80 mg Oral Daily  . clopidogrel  75 mg Oral Daily  . divalproex  500 mg Oral Daily  . enoxaparin (LOVENOX) injection  40 mg Subcutaneous Daily  . ezetimibe  10 mg Oral Daily  . insulin aspart  0-5 Units Subcutaneous QHS  . insulin aspart  0-9 Units Subcutaneous TID WC  . niacin  500 mg Oral QHS   Continuous Infusions: . sodium chloride 100 mL/hr at 11/24/18 2329  . cefTRIAXone (ROCEPHIN)  IV      Assessment & Plan:    1. UTI with sepsis POA: patient tachycardic/tachypneic/febrile with leucocytosis, elevated creatinine and elevated  lactate meeting sepsis criterion on admission.Continue IV fluid, IV Ceftriaxone. Persistent leucocytosis on labs today,improved fevers and lactate levels. Urine culture and Blood culture x2 pending results.  2. ?Paroxysmal A.flutter: EKG yesterday afternoon indicative of A.flutter vs artifact with HR at 88 but repeat EKG in the evening was clearly sinus tach @HR  101. Per cardiology documentation patient has a prior history of postop A. fib and is currently not on anticoagulation. Monitor on tele, monitor electrolytes  3. Insulin-dependent diabetes mellitus:Sliding scale insulin sensitive with bedtime coverage and CBG checks. HgbA1C 7.1 in August  4. Hypertension: Home meds held in concern for #1. Resume when BP starts to pick up. SBP 120s except recorded once at 98/50.   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 PT eval      LOS: 1 day    Time spent:     [place] Shi, MD [facility] Pager [phone]  If 7PM-7AM, please contact night-coverage [email or web address] Password [facility] 11/25/2018, 8:58 AM

## 2018-11-26 DIAGNOSIS — I10 Essential (primary) hypertension: Secondary | ICD-10-CM

## 2018-11-26 LAB — GLUCOSE, CAPILLARY
Glucose-Capillary: 196 mg/dL — ABNORMAL HIGH (ref 70–99)
Glucose-Capillary: 281 mg/dL — ABNORMAL HIGH (ref 70–99)
Glucose-Capillary: 262 mg/dL — ABNORMAL HIGH (ref 70–99)
Glucose-Capillary: 217 mg/dL — ABNORMAL HIGH (ref 70–99)

## 2018-11-26 LAB — URINE CULTURE: Culture: >=100000 — AB

## 2018-11-26 LAB — HEMOGLOBIN A1C
Hgb A1c MFr Bld: 8.3 % — ABNORMAL HIGH (ref 4.8–5.6)
Mean Plasma Glucose: 192 mg/dL

## 2018-11-26 MED ORDER — SODIUM CHLORIDE 0.9 % IV SOLN
INTRAVENOUS | Status: DC
  Administered 2018-11-26 – 2018-11-27 (×2): via INTRAVENOUS

## 2018-11-26 NOTE — Progress Notes (Signed)
PROGRESS NOTE    Fred [name]  [id number] DOB: Jan 18, 1936 DOA: 11/24/2018 PCP: [name], MD   Brief Narrative:  83 y.o. male with medical history significant of postop A. 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    Upon admission he was noted to be septic secondary to urinary tract infection.  CT of the head was negative.   Assessment & Plan:   Principal Problem:   UTI (urinary tract infection) Active Problems:   Essential hypertension   Type 2 diabetes mellitus with complication, with long-term current use of insulin (HCC)   Stroke (cerebrum) (HCC)   Sepsis (HCC)  Sepsis secondary to urinary tract infection due to Klebsiella - Antibiotic sensitivity pending.  Urine culture growing Klebsiella -Continue IV Rocephin and IV fluid -Supportive care.  History of atrial fibrillation/flutter, paroxysmal - Not on anticoagulation.  Follow-up outpatient.  Generalized deconditioning -PT-will need home health  Diabetes mellitus type 2, insulin dependent - Insulin sliding scale and Accu-Chek -Hemoglobin A1c  Previous history of CVA with residual left-sided weakness -On home Plavix  Hyperlipidemia -Continue Lipitor and Zetia  History of seizure -On Depakote  Gout -Allopurinol  Essential hypertension -Holding off on antihypertensive  DVT prophylaxis: Lovenox Code Status: Full code Family Communication: None at bedside Disposition Plan: Maintain hospital stay until his weakness is better, hydration status is improved and in the meantime await culture sensitivity data.  Consultants:   None  Procedures:   None  Antimicrobials:   Rocephin day 2   Subjective: Overall tells me he feels  generalized weakness and has dry mouth.  No other new complaints.  Remains afebrile overnight.  Review of Systems Otherwise negative except as per HPI, including: General: Denies fever, chills, night sweats or unintended weight loss. Resp: Denies cough, wheezing, shortness of breath. Cardiac: Denies chest pain, palpitations, orthopnea, paroxysmal nocturnal dyspnea. GI: Denies abdominal pain, nausea, vomiting, diarrhea or constipation GU: Denies dysuria, frequency, hesitancy or incontinence MS: Denies muscle aches, joint pain or swelling Neuro: Denies headache, neurologic deficits (focal weakness, numbness, tingling), abnormal gait Psych: Denies anxiety, depression, SI/HI/AVH Skin: Denies new rashes or lesions ID: Denies sick contacts, exotic exposures, travel  Objective: Vitals:   11/25/18 0951 11/25/18 2101 11/26/18 0527 11/26/18 0824  BP: (!) 100/54 129/73 121/64 (!) 118/58  Pulse: 96 (!) 107 97 (!) 106  Resp: 18 19 18 18   Temp: 98.1 F (36.7 C)  98.6 F (37 C) 98.6 F (37 C)  TempSrc: Oral  Oral Oral  SpO2: 96% 94% 96% 95%  Weight:      Height:        Intake/Output Summary (Last 24 hours) at 11/26/2018 1226 Last data filed at 11/26/2018 1224 Gross per 24 hour  Intake 1240 ml  Output 100 ml  Net 1140 ml   Filed Weights   11/24/18 1342  Weight: 74.8 kg    Examination:  General exam: Appears calm and comfortable, dry mouth Respiratory system: Clear to auscultation. Respiratory effort normal. Cardiovascular system: S1 & S2 heard, RRR. No JVD, murmurs, rubs, gallops or clicks. No pedal edema. Gastrointestinal system: Abdomen is nondistended, soft and nontender. No organomegaly or masses felt. Normal bowel sounds heard. Central nervous system: Alert and oriented. No focal neurological deficits. Extremities: Symmetric 4+ x 5 power. Skin: No rashes, lesions or ulcers Psychiatry: Judgement  and insight appear normal. Mood & affect appropriate.     Data Reviewed:    CBC: Recent Labs  Lab 11/24/18 1409 11/25/18 0158  WBC 22.6* 22.9*  NEUTROABS 19.2*  --   HGB 13.8 12.1*  HCT 40.5 35.4*  MCV 96.0 95.7  PLT 201 [id number]   Basic Metabolic Panel: Recent Labs  Lab 11/24/18 1409 11/25/18 0158  NA 137 138  K 4.5 3.9  CL 103 107  CO2 23 23  GLUCOSE 250* 188*  BUN 19 18  CREATININE 1.31* 1.10  CALCIUM 8.9 8.5*   GFR: Estimated Creatinine Clearance: 47.8 mL/min (by C-G formula based on SCr of 1.1 mg/dL). Liver Function Tests: Recent Labs  Lab 11/24/18 1409  AST 15  ALT 18  ALKPHOS 60  BILITOT 1.2  PROT 6.4*  ALBUMIN 3.3*   No results for input(s): LIPASE, AMYLASE in the last 168 hours. No results for input(s): AMMONIA in the last 168 hours. Coagulation Profile: No results for input(s): INR, PROTIME in the last 168 hours. Cardiac Enzymes: No results for input(s): CKTOTAL, CKMB, CKMBINDEX, TROPONINI in the last 168 hours. BNP (last 3 results) No results for input(s): PROBNP in the last 8760 hours. HbA1C: Recent Labs    11/24/18 1409  HGBA1C 8.3*   CBG: Recent Labs  Lab 11/25/18 1117 11/25/18 1631 11/25/18 2100 11/26/18 0712 11/26/18 1144  GLUCAP 212* 184* 230* 196* 281*   Lipid Profile: No results for input(s): CHOL, HDL, LDLCALC, TRIG, CHOLHDL, LDLDIRECT in the last 72 hours. Thyroid Function Tests: No results for input(s): TSH, T4TOTAL, FREET4, T3FREE, THYROIDAB in the last 72 hours. Anemia Panel: No results for input(s): VITAMINB12, FOLATE, FERRITIN, TIBC, IRON, RETICCTPCT in the last 72 hours. Sepsis Labs: Recent Labs  Lab 11/24/18 1552 11/24/18 1645 11/25/18 0158  LATICACIDVEN 1.6 2.9* 1.6    Recent Results (from the past 240 hour(s))  SARS Coronavirus 2 [facility] order, Performed in [facility] hospital lab) Nasopharyngeal Nasopharyngeal Swab     Status: None   Collection Time: 11/24/18  2:47 PM   Specimen: Nasopharyngeal Swab  Result Value Ref Range Status   SARS Coronavirus 2 NEGATIVE NEGATIVE Final     Comment: (NOTE) If result is NEGATIVE SARS-CoV-2 target nucleic acids are NOT DETECTED. The SARS-CoV-2 RNA is generally detectable in upper and lower  respiratory specimens during the acute phase of infection. The lowest  concentration of SARS-CoV-2 viral copies this assay can detect is 250  copies / mL. A negative result does not preclude SARS-CoV-2 infection  and should not be used as the sole basis for treatment or other  patient management decisions.  A negative result may occur with  improper specimen collection / handling, submission of specimen other  than nasopharyngeal swab, presence of viral mutation(s) within the  areas targeted by this assay, and inadequate number of viral copies  (<250 copies / mL). A negative result must be combined with clinical  observations, patient history, and epidemiological information. If result is POSITIVE SARS-CoV-2 target nucleic acids are DETECTED. The SARS-CoV-2 RNA is generally detectable in upper and lower  respiratory specimens dur ing the acute phase of infection.  Positive  results are indicative of active infection with SARS-CoV-2.  Clinical  correlation with patient history and other diagnostic information is  necessary to determine patient infection status.  Positive results do  not rule out bacterial infection or co-infection with other viruses. If result is PRESUMPTIVE POSTIVE SARS-CoV-2 nucleic acids MAY BE PRESENT.   A presumptive positive result  was obtained on the submitted specimen  and confirmed on repeat testing.  While 2019 novel coronavirus  (SARS-CoV-2) nucleic acids may be present in the submitted sample  additional confirmatory testing may be necessary for epidemiological  and / or clinical management purposes  to differentiate between  SARS-CoV-2 and other Sarbecovirus currently known to infect humans.  If clinically indicated additional testing with an alternate test  methodology [phone]) is advised. The SARS-CoV-2  RNA is generally  detectable in upper and lower respiratory sp ecimens during the acute  phase of infection. The expected result is Negative. Fact Sheet for Patients:  StrictlyIdeas.no Fact Sheet for Healthcare Providers: BankingDealers.co.za This test is not yet approved or cleared by the [place] FDA and has been authorized for detection and/or diagnosis of SARS-CoV-2 by FDA under an Emergency Use Authorization (EUA).  This EUA will remain in effect (meaning this test can be used) for the duration of the COVID-19 declaration under Section 564(b)(1) of the Act, 21 U.S.C. section 360bbb-3(b)(1), unless the authorization is terminated or revoked sooner. Performed at [facility], [place] [address], [place], [place] [address]   Urine culture     Status: Abnormal   Collection Time: 11/24/18  3:52 PM   Specimen: Urine, Random  Result Value Ref Range Status   Specimen Description URINE, RANDOM  Final   Special Requests   Final    SITE NOT SPECIFIED Performed at [facility], [place] [address] [place], [place] [address]    Culture >=100,000 COLONIES/mL KLEBSIELLA OXYTOCA (A)  Final   Report Status 11/26/2018 FINAL  Final   Organism ID, Bacteria KLEBSIELLA OXYTOCA (A)  Final      Susceptibility   Klebsiella oxytoca - MIC*    AMPICILLIN >=32 RESISTANT Resistant     CEFAZOLIN 8 SENSITIVE Sensitive     CEFTRIAXONE <=1 SENSITIVE Sensitive     CIPROFLOXACIN <=0.25 SENSITIVE Sensitive     GENTAMICIN <=1 SENSITIVE Sensitive     IMIPENEM <=0.25 SENSITIVE Sensitive     NITROFURANTOIN <=16 SENSITIVE Sensitive     TRIMETH/SULFA <=20 SENSITIVE Sensitive     AMPICILLIN/SULBACTAM 16 INTERMEDIATE Intermediate     PIP/TAZO <=4 SENSITIVE Sensitive     Extended ESBL NEGATIVE Sensitive     * >=100,000 COLONIES/mL KLEBSIELLA OXYTOCA  Culture, blood (routine x 2)     Status: None (Preliminary result)   Collection Time: 11/24/18  3:52 PM    Specimen: BLOOD RIGHT HAND  Result Value Ref Range Status   Specimen Description BLOOD RIGHT HAND  Final   Special Requests   Final    BOTTLES DRAWN AEROBIC AND ANAEROBIC Blood Culture adequate volume   Culture   Final    NO GROWTH 1 DAY Performed at [facility], [address] [place], [place] [address]    Report Status PENDING  Incomplete  Culture, blood (routine x 2)     Status: None (Preliminary result)   Collection Time: 11/24/18  6:07 PM   Specimen: BLOOD RIGHT WRIST  Result Value Ref Range Status   Specimen Description BLOOD RIGHT WRIST  Final   Special Requests   Final    BOTTLES DRAWN AEROBIC AND ANAEROBIC Blood Culture results may not be optimal due to an inadequate volume of blood received in culture bottles   Culture   Final    NO GROWTH 1 DAY Performed at [facility], [place] [address], [place], [place] [address]    Report Status PENDING  Incomplete         Radiology Studies: Dg Chest 2 View  Result Date: 11/24/2018 CLINICAL DATA:  Weakness. EXAM: CHEST - 2 VIEW COMPARISON:  Chest radiograph 03/19/2018 FINDINGS: Monitoring leads overlie the patient. Stable cardiac and mediastinal contours status post median sternotomy. Low lung volumes. Bibasilar atelectasis. Lingular scarring. No pleural effusion or pneumothorax. Thoracic spine degenerative changes. IMPRESSION: Low lung volumes with basilar atelectasis. Electronically Signed   By: Lovey Newcomer M.D.   On: 11/24/2018 14:54   Ct Head Wo Contrast  Result Date: 11/24/2018 CLINICAL DATA:  Altered mental status. EXAM: CT HEAD WITHOUT CONTRAST TECHNIQUE: Contiguous axial images were obtained from the base of the skull through the vertex without intravenous contrast. COMPARISON:  08/09/2017 FINDINGS: Brain: Ventricles and cisterns are within normal. CSF spaces are within normal. There is moderate chronic ischemic microvascular disease unchanged. Old lacunar infarct posterior limb right internal capsule. No mass,  mass effect, shift of midline structures or acute hemorrhage. No acute infarction. Vascular: No hyperdense vessel or unexpected calcification. Skull: Normal. Negative for fracture or focal lesion. Sinuses/Orbits: No acute finding. Other: None. IMPRESSION: No acute findings. Moderate chronic ischemic microvascular disease. Small old right-sided lacunar infarct. Electronically Signed   By: Marin Olp M.D.   On: 11/24/2018 14:51        Scheduled Meds: . allopurinol  300 mg Oral Daily  . atorvastatin  80 mg Oral Daily  . clopidogrel  75 mg Oral Daily  . divalproex  500 mg Oral Daily  . enoxaparin (LOVENOX) injection  40 mg Subcutaneous Daily  . ezetimibe  10 mg Oral Daily  . insulin aspart  0-5 Units Subcutaneous QHS  . insulin aspart  0-9 Units Subcutaneous TID WC  . niacin  500 mg Oral QHS   Continuous Infusions: . cefTRIAXone (ROCEPHIN)  IV 1 g (11/25/18 1706)     LOS: 2 days   Time spent= 35 mins    Kimberlynn Lumbra Arsenio Loader, MD [facility]  If 7PM-7AM, please contact night-coverage [email or web address] 11/26/2018, 12:26 PM

## 2018-11-26 NOTE — TOC Initial Note (Addendum)
Transition of Care [facility]) - Initial/Assessment Note    Patient Details  Name: Fred [name] MRN: [id number] Date of Birth: October 03, 1935  Transition of Care [facility]) CM/SW Contact:    Bartholomew Crews, RN Phone Number: [phone] 11/26/2018, 2:09 PM  Clinical Narrative:                 Spoke with daughter, [name], on the phone. CM also spoke with daughter, Butch Penny, at bedside with patient. PTA patient home with spouse who has dementia that has been worsening over the last couple months and is oxygen dependent d/t copd. Patient is paralyzed on left side and baseline is a stand pivot on right from bed to chair, and requires set up for meals.   Patient's home is well equipped with walk in shower, elevated toilet seat, electric wheelchair, manual wheelchair, scooter, hemi walker, and 3N1. Has a recliner chair that can put patient in a standing position, and a bed that can raise head of bed and foot of bed.  Discussed hoyer lift - referral sent to [employer] for delivery to home. Home address verified.   Debbie lives next door to patient and is available to assist.   Daughters have been in [facility] to Dr. Stann Mainland at [facility], but has not completed all necessary intake processes. Plan is to access home care services. Discussed [facility] - in agreement - referral placed - accepted.   Active with community PCP. Discussed maintaining relationship with community PCP even after [facility]. Daughters agreed.   Discussed [facility] referral as part of [facility] program.   Family is able to transport patient home with patient's manual wheelchair and their wheelchair van. CM following for transition of care needs.   Expected Discharge Plan: [place] Barriers to Discharge: Continued Medical Work up   Patient Goals and CMS Choice Patient states their goals for this hospitalization and ongoing recovery are:: to return  home CMS Medicare.gov Compare Post Acute Care list provided to:: Patient Choice offered to / list presented to : Patient, Adult Children  Expected Discharge Plan and Services Expected Discharge Plan: [place] In-house Referral: [facility] Discharge Planning Services: CM Consult Post Acute Care Choice: Durable Medical Equipment, Home Health Living arrangements for the past 2 months: Single Family Home                 DME Arranged: Other see comment(hoyer lift) DME Agency: [employer] Date DME Agency Contacted: 11/26/18 Time DME Agency Contacted: 25 Representative spoke with at DME Agency: Thedore Mins HH Arranged: RN, PT, OT, Nurse's Aide, Social Work [employer] Agency: [place] Date [place]: 11/26/18 Time [place]: 41 Representative spoke with at [place] Arrangements/Services Living arrangements for the past 2 months: [place] with:: Self, Spouse(daughter - [place] lives next door) Patient language and need for interpreter reviewed:: Yes Do you feel safe going back to the place where you live?: Yes      Need for Family Participation in Patient Care: Yes (Comment) Care giver support system in place?: Yes (comment) Current home services: DME, Home PT, Home OT Criminal Activity/Legal Involvement Pertinent to Current Situation/Hospitalization: No - Comment as needed  Activities of Daily Living Home Assistive Devices/Equipment: None ADL Screening (condition at time of admission) Patient's cognitive ability adequate to safely complete daily activities?: Yes Is the patient deaf or  have difficulty hearing?: No Does the patient have difficulty seeing, even when wearing glasses/contacts?: No Does the patient have difficulty concentrating, remembering, or making decisions?: No Patient able to express need for assistance with ADLs?: Yes Does the patient have difficulty dressing or bathing?: Yes Independently  performs ADLs?: No Communication: Independent Dressing (OT): Needs assistance Is this a change from baseline?: Pre-admission baseline Grooming: Needs assistance Is this a change from baseline?: Pre-admission baseline Feeding: Independent Bathing: Needs assistance Is this a change from baseline?: Pre-admission baseline Toileting: Needs assistance Is this a change from baseline?: Pre-admission baseline In/Out Bed: Needs assistance Is this a change from baseline?: Pre-admission baseline Walks in Home: Dependent Is this a change from baseline?: Pre-admission baseline Does the patient have difficulty walking or climbing stairs?: Yes Weakness of Legs: Both Weakness of Arms/Hands: Left  Permission Sought/Granted Permission sought to share information with : Family Supports Permission granted to share information with : Yes, Verbal Permission Granted  Share Information with NAME: Butch Penny and [place] granted to share info w Relationship: daughters     Emotional Assessment Appearance:: Appears stated age Attitude/Demeanor/Rapport: Engaged Affect (typically observed): Accepting Orientation: : Oriented to Self, Oriented to Place Alcohol / Substance Use: Not Applicable Psych Involvement: No (comment)  Admission diagnosis:  Weakness [R53.1] Patient Active Problem List   Diagnosis Date Noted  . UTI (urinary tract infection) 11/24/2018  . Sepsis ([place]) 11/24/2018  . 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 Chronic diastolic (congestive) heart failure ([place])   . Hemiparesis affecting left side as late effect of cerebrovascular accident ([place])   . Diabetes mellitus type 2 in obese ([place])   . Small vessel disease ([place]) 08/11/2017  . Left hemiparesis ([place])   . CVA (cerebral vascular accident) ([place]) 08/10/2017  . 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 Primary osteoarthritis of both knees 05/11/2017  . Stroke (cerebrum) ([place]) 03/21/2017  . Type 2 diabetes mellitus with complication, with long-term current use of insulin ([place]) 03/27/2015  . Atherosclerosis of native coronary artery of native heart without angina pectoris 03/27/2015  . Palpitations 11/04/2014  . S/P CABG (coronary artery bypass graft) 11/04/2014  . Atrial fibrillation, unspecified   . History of stroke 09/04/2014  . Obesity (BMI 30-39.9) 11/29/2013  . History of gout 05/16/2013  . CHF, chronic ([place]) 04/20/2009  . CARDIOVASCULAR FUNCTION STUDY, ABNORMAL 01/29/2009  . NECK PAIN 08/03/2007  . ANEMIA / OTHER 05/16/2007  . Coronary atherosclerosis 05/16/2007  . PAROXYSMAL ATRIAL FIBRILLATION 05/16/2007  . Subcortical infarction ([place]) 05/16/2007  . Hyperlipidemia 03/30/2007  . DEGENERATIVE JOINT DISEASE 03/30/2007  . PAROTID LESION, UNSPECIFIED 09/29/2006  . Partial epilepsy with impairment of consciousness ([place]) 09/29/2006  . Diabetic polyneuropathy ([place]) 09/29/2006  . Essential hypertension 09/29/2006  . PERIPHERAL VASCULAR DISEASE 09/29/2006  . DIVERTICULOSIS 09/29/2006   PCP:  [name], MD Pharmacy:   [facility] [place], [place] - [address] [place] [address] [place] [address] [place] [address] Phone: [phone] Fax: [phone]     Social Determinants of Health (SDOH) Interventions    Readmission Risk Interventions No flowsheet data found.

## 2018-11-26 NOTE — Consult Note (Signed)
   [facility] Inpatient Consult   11/26/2018  Fred [name] 1935/06/05 [id number]  Patient screened for  potential [facility] services. Patient is in the Medicare ACO.  Primary Care Provider is  [name], MD  Plan:  Updated inpatient [facility] patient is eligible for services.   For questions contact:   Natividad Brood, RN BSN [facility] Liaison  [phone] business mobile phone Toll free office [phone]  Fax number: [phone] [name]

## 2018-11-26 NOTE — Plan of Care (Signed)
  Problem: Clinical Measurements: Goal: Respiratory complications will improve Outcome: Progressing   

## 2018-11-26 NOTE — Progress Notes (Signed)
Physical Therapy Treatment Patient Details Name: Fred [name] MRN: [id number] DOB: 1936-03-02 Today's Date: 11/26/2018    History of Present Illness Fred [name] is a 83 y.o. male with medical history significant of postop A. 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    PT Comments    Continuing work on functional mobility and activity tolerance;  Very good participation; Notable L knee pain that was not present yesterday; Pt tells me he may have injured his L knee during the fall that landed him in the hospital; Appreciate Case Mgr's work on setting up home health services  Follow Up Recommendations  Home health PT;Supervision/Assistance - 24 hour;Other (comment)(Noted he was accepted into [facility])     Equipment Recommendations  Other (comment)(Needs power chair repaired)    Recommendations for Other Services       Precautions / Restrictions Precautions Precautions: Fall    Mobility  Bed Mobility Overal bed mobility: Needs Assistance Bed Mobility: Sit to Supine       Sit to supine: Mod assist   General bed mobility comments: Assist to help LEs back into the bed, then cues for centered positioning; uses RUE well to pull up to [facility]  Transfers Overall transfer level: Needs assistance Equipment used: 1 person hand held assist(bil support on gait belt) Transfers: Stand Pivot Transfers   Stand pivot transfers: Mod assist       General transfer comment: Mod assist to power up and then pivot back to bed on pt's stronger R side  Ambulation/Gait                 Stairs             Wheelchair Mobility    Modified Rankin (Stroke Patients Only)       Balance     Sitting  balance-Leahy Scale: Fair(approaching good)       Standing balance-Leahy Scale: Poor                              Cognition Arousal/Alertness: Awake/alert Behavior During Therapy: WFL for tasks assessed/performed(emotionally labile) Overall Cognitive Status: Within Functional Limits for tasks assessed(for simple mobility tasks)                                        Exercises      General Comments        Pertinent Vitals/Pain Pain Assessment: Faces Faces Pain Scale: Hurts little more Pain Location: L knee Pain Descriptors / Indicators: Grimacing;Guarding Pain Intervention(s): Monitored during session    Home Living                      Prior Function            PT Goals (current goals can now be found in the care plan section) Acute Rehab PT Goals Patient Stated Goal: did not specifically state, but agreeable to getting OOB PT Goal Formulation: With patient Time For Goal Achievement: 12/09/18 Potential to Achieve Goals: Good Progress towards PT goals: Progressing toward goals    Frequency    Min 3X/week      PT Plan Current  plan remains appropriate    Co-evaluation              AM-PAC PT "6 Clicks" Mobility   Outcome Measure  Help needed turning from your back to your side while in a flat bed without using bedrails?: A Little Help needed moving from lying on your back to sitting on the side of a flat bed without using bedrails?: A Lot Help needed moving to and from a bed to a chair (including a wheelchair)?: A Lot Help needed standing up from a chair using your arms (e.g., wheelchair or bedside chair)?: A Lot Help needed to walk in hospital room?: Total Help needed climbing 3-5 steps with a railing? : Total 6 Click Score: 11    End of Session Equipment Utilized During Treatment: Gait belt Activity Tolerance: Patient tolerated treatment well Patient left: in bed;with call bell/phone within reach;with  family/visitor present Nurse Communication: Mobility status PT Visit Diagnosis: Other abnormalities of gait and mobility (R26.89);Hemiplegia and hemiparesis;Muscle weakness (generalized) (M62.81);Repeated falls (R29.6);History of falling (Z91.81) Hemiplegia - Right/Left: Left Hemiplegia - dominant/non-dominant: Non-dominant Hemiplegia - caused by: Cerebral infarction     Time: 1510-1530 PT Time Calculation (min) (ACUTE ONLY): 20 min  Charges:  $Therapeutic Activity: 8-22 mins                     Roney Marion, PT  Acute Rehabilitation Services Pager [phone] Office [place] 11/26/2018, 4:27 PM

## 2018-11-27 LAB — GLUCOSE, CAPILLARY: Glucose-Capillary: 176 mg/dL — ABNORMAL HIGH (ref 70–99)

## 2018-11-27 LAB — CBC
HCT: 33.3 % — ABNORMAL LOW (ref 39.0–52.0)
Hemoglobin: 11.5 g/dL — ABNORMAL LOW (ref 13.0–17.0)
MCH: 32.5 pg (ref 26.0–34.0)
MCHC: 34.5 g/dL (ref 30.0–36.0)
MCV: 94.1 fL (ref 80.0–100.0)
Platelets: 176 10*3/uL (ref 150–400)
RBC: 3.54 MIL/uL — ABNORMAL LOW (ref 4.22–5.81)
RDW: 12.4 % (ref 11.5–15.5)
WBC: 4.9 10*3/uL (ref 4.0–10.5)
nRBC: 0 % (ref 0.0–0.2)

## 2018-11-27 MED ORDER — CEPHALEXIN 250 MG PO CAPS: 250.0000 mg | ORAL_CAPSULE | Freq: Four times a day (QID) | ORAL | 0 refills | Status: AC

## 2018-11-27 MED FILL — CEPHALEXIN 250 MG CAPSULE: 250 | 3 days supply | Qty: 12 | Fill #0

## 2018-11-27 NOTE — Discharge Summary (Signed)
Physician Discharge Summary  CLAUDY ABDALLAH RUE:[id number] DOB: 11-Sep-1935 DOA: 11/24/2018  PCP: [name], MD  Admit date: 11/24/2018 Discharge date: 11/27/2018  Admitted From: Home Disposition: Home  Recommendations for Outpatient Follow-up:  1. Follow up with PCP in 1-2 weeks 2. Please obtain BMP/CBC in one week your next doctors visit.  3. Keflex for 3 more days   Discharge Condition: Stable CODE STATUS: Full code Diet recommendation: Diabetic  Brief/Interim Summary: 83 y.o.malewith medical history significant ofpostop A. 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   Upon admission he was noted to be septic secondary to urinary tract infection.  CT of the head was negative.  Cultures were positive for Klebsiella which was pansensitive.  Transition to 3 more days of oral Keflex.  Stable for discharge with home health as recommended by PT.   Discharge Diagnoses:  Principal Problem:   UTI (urinary tract infection) Active Problems:   Essential hypertension   Type 2 diabetes mellitus with complication, with long-term current use of insulin (HCC)   Stroke (cerebrum) (HCC)   Sepsis (HCC)  Sepsis secondary to urinary tract infection due to Klebsiella -  Cultures positive for Klebsiella, sensitive to Rocephin.  Will transition to Keflex for 3 more days.  History of atrial fibrillation/flutter, paroxysmal - Not on anticoagulation.  Follow-up outpatient.  Generalized deconditioning -PT-will need home health  Diabetes mellitus type 2, insulin dependent -  Resume home meds -Hemoglobin A1c- 8.3  Previous history of CVA with residual left-sided weakness -On home Plavix  Hyperlipidemia -Continue Lipitor  and Zetia  History of seizure -On Depakote  Gout -Allopurinol  Essential hypertension -Resume home meds  Consultations:  None  Subjective: No complaints. Wants to go home.   Discharge Exam: Vitals:   11/27/18 0421 11/27/18 0931  BP: 132/74 128/72  Pulse: 100   Resp: 18 18  Temp: 98.2 F (36.8 C) 98.6 F (37 C)  SpO2: 95% 96%   Vitals:   11/26/18 2055 11/27/18 0419 11/27/18 0421 11/27/18 0931  BP: 119/69  132/74 128/72  Pulse: (!) 111 96 100   Resp: '17  18 18  '$ Temp: 99 F (37.2 C)  98.2 F (36.8 C) 98.6 F (37 C)  TempSrc: Oral  Oral Oral  SpO2: 97%  95% 96%  Weight: 74.9 kg     Height:        General: Pt is alert, awake, not in acute distress Cardiovascular: RRR, S1/S2 +, no rubs, no gallops Respiratory: CTA bilaterally, no wheezing, no rhonchi Abdominal: Soft, NT, ND, bowel sounds + Extremities: no edema, no cyanosis  Discharge Instructions   Allergies as of 11/27/2018      Reactions   Lisinopril Cough      Medication List    TAKE these medications   acetaminophen 325 MG tablet Commonly known as: TYLENOL Take 2 tablets (650 mg total) by mouth every 4 (four) hours as needed for mild pain (or temp > 37.5 C (99.5 F)).   allopurinol 300 MG tablet Commonly known as: ZYLOPRIM Take 1 tablet (300 mg total) by mouth daily.   atorvastatin 80 MG tablet Commonly known as: LIPITOR Take 1 tablet (80 mg total) by mouth daily.   blood glucose meter kit and supplies Check blood sugar 3 times daily.   cephALEXin 250 MG capsule Commonly known  as: Keflex Take 1 capsule (250 mg total) by mouth 4 (four) times daily for 3 days.   clopidogrel 75 MG tablet Commonly known as: PLAVIX Take 1 tablet (75 mg total) by mouth daily.   CoQ10 100 MG Caps Take 300 mg by mouth daily.   divalproex 500 MG 24 hr tablet Commonly known as: DEPAKOTE ER Take 1 tablet (500 mg total) by mouth daily.   ezetimibe 10 MG tablet Commonly known as: Zetia Take 1 tablet (10  mg total) by mouth daily.   furosemide 20 MG tablet Commonly known as: LASIX Take 1 tablet (20 mg total) by mouth daily. What changed: when to take this   Insulin Pen Needle 32G X 6 MM Misc Commonly known as: NovoFine Inject insulin daily   losartan 25 MG tablet Commonly known as: COZAAR TAKE 1 TABLET BY MOUTH DAILY   metFORMIN 500 MG tablet Commonly known as: GLUCOPHAGE TAKE 1 TABLET BY MOUTH TWICE DAILY WITH A MEAL What changed:   how much to take  how to take this  when to take this  additional instructions   metoprolol succinate 50 MG 24 hr tablet Commonly known as: TOPROL-XL TAKE 1 TABLET BY MOUTH DAILY IMMEDIATELY FOLLOWING A MEAL What changed: See the new instructions.   niacin 500 MG CR tablet Commonly known as: NIASPAN Take 1 tablet (500 mg total) by mouth at bedtime.   ONE TOUCH ULTRA TEST test strip Generic drug: glucose blood   OneTouch Delica Plus [id number] Misc   senna-docusate 8.6-50 MG tablet Commonly known as: Senokot-S Take 1 tablet by mouth 2 (two) times daily. What changed:   when to take this  reasons to take this   Toujeo SoloStar 300 UNIT/ML Sopn Generic drug: Insulin Glargine (1 Unit Dial) Inject 10-20 Units into the skin at bedtime.   Tradjenta 5 MG Tabs tablet Generic drug: linagliptin TAKE 1 TABLET BY MOUTH DAILY What changed: how much to take            Durable Medical Equipment  (From admission, onward)         Start     Ordered   11/26/18 1338  For home use only DME Other see comment  Once    Comments: Harrel Lemon lift  Question:  Length of Need  Answer:  Lifetime   11/26/18 1338         Follow-up Information    [facility] Follow up.   Specialty: [place] Why: a clinician will follow up to schedule home health visits Contact information: [address] [place] [address] [phone]        [facility] Follow up.   Why: hoyer lift to be delivered        [name], MD. Schedule an appointment as soon as possible for a visit in 1 week(s).   Specialty: Family Medicine Contact information: [address] [place] [address] [phone]          Allergies  Allergen Reactions  . Lisinopril Cough    You were cared for by a hospitalist during your hospital stay. If you have any questions about your discharge medications or the care you received while you were in the hospital after you are discharged, you can call the unit and asked to speak with the hospitalist on call if the hospitalist that took care of you is not available. Once you are discharged, your primary care physician will handle any  further medical issues. Please note that no refills for any discharge medications will be authorized once you are discharged, as it is imperative that you return to your primary care physician (or establish a relationship with a primary care physician if you do not have one) for your aftercare needs so that they can reassess your need for medications and monitor your lab values.   Procedures/Studies: Dg Chest 2 View  Result Date: 11/24/2018 CLINICAL DATA:  Weakness. EXAM: CHEST - 2 VIEW COMPARISON:  Chest radiograph 03/19/2018 FINDINGS: Monitoring leads overlie the patient. Stable cardiac and mediastinal contours status post median sternotomy. Low lung volumes. Bibasilar atelectasis. Lingular scarring. No pleural effusion or pneumothorax. Thoracic spine degenerative changes. IMPRESSION: Low lung volumes with basilar atelectasis. Electronically Signed   By: Lovey Newcomer M.D.   On: 11/24/2018 14:54   Ct Head Wo Contrast  Result Date: 11/24/2018 CLINICAL DATA:  Altered mental status. EXAM: CT HEAD WITHOUT CONTRAST TECHNIQUE: Contiguous axial images were obtained from the base of the skull through the vertex without intravenous contrast. COMPARISON:  08/09/2017 FINDINGS: Brain: Ventricles and cisterns are within normal. CSF  spaces are within normal. There is moderate chronic ischemic microvascular disease unchanged. Old lacunar infarct posterior limb right internal capsule. No mass, mass effect, shift of midline structures or acute hemorrhage. No acute infarction. Vascular: No hyperdense vessel or unexpected calcification. Skull: Normal. Negative for fracture or focal lesion. Sinuses/Orbits: No acute finding. Other: None. IMPRESSION: No acute findings. Moderate chronic ischemic microvascular disease. Small old right-sided lacunar infarct. Electronically Signed   By: Marin Olp M.D.   On: 11/24/2018 14:51      The results of significant diagnostics from this hospitalization (including imaging, microbiology, ancillary and laboratory) are listed below for reference.     Microbiology: Recent Results (from the past 240 hour(s))  SARS Coronavirus 2 [facility] order, Performed in [facility] hospital lab) Nasopharyngeal Nasopharyngeal Swab     Status: None   Collection Time: 11/24/18  2:47 PM   Specimen: Nasopharyngeal Swab  Result Value Ref Range Status   SARS Coronavirus 2 NEGATIVE NEGATIVE Final    Comment: (NOTE) If result is NEGATIVE SARS-CoV-2 target nucleic acids are NOT DETECTED. The SARS-CoV-2 RNA is generally detectable in upper and lower  respiratory specimens during the acute phase of infection. The lowest  concentration of SARS-CoV-2 viral copies this assay can detect is 250  copies / mL. A negative result does not preclude SARS-CoV-2 infection  and should not be used as the sole basis for treatment or other  patient management decisions.  A negative result may occur with  improper specimen collection / handling, submission of specimen other  than nasopharyngeal swab, presence of viral mutation(s) within the  areas targeted by this assay, and inadequate number of viral copies  (<250 copies / mL). A negative result must be combined with clinical  observations, patient history, and epidemiological  information. If result is POSITIVE SARS-CoV-2 target nucleic acids are DETECTED. The SARS-CoV-2 RNA is generally detectable in upper and lower  respiratory specimens dur ing the acute phase of infection.  Positive  results are indicative of active infection with SARS-CoV-2.  Clinical  correlation with patient history and other diagnostic information is  necessary to determine patient infection status.  Positive results do  not rule out bacterial infection or co-infection with other viruses. If result is PRESUMPTIVE POSTIVE SARS-CoV-2 nucleic acids MAY BE PRESENT.   A presumptive positive result was obtained on the submitted specimen  and confirmed  on repeat testing.  While 2019 novel coronavirus  (SARS-CoV-2) nucleic acids may be present in the submitted sample  additional confirmatory testing may be necessary for epidemiological  and / or clinical management purposes  to differentiate between  SARS-CoV-2 and other Sarbecovirus currently known to infect humans.  If clinically indicated additional testing with an alternate test  methodology [phone]) is advised. The SARS-CoV-2 RNA is generally  detectable in upper and lower respiratory sp ecimens during the acute  phase of infection. The expected result is Negative. Fact Sheet for Patients:  StrictlyIdeas.no Fact Sheet for Healthcare Providers: BankingDealers.co.za This test is not yet approved or cleared by the [place] FDA and has been authorized for detection and/or diagnosis of SARS-CoV-2 by FDA under an Emergency Use Authorization (EUA).  This EUA will remain in effect (meaning this test can be used) for the duration of the COVID-19 declaration under Section 564(b)(1) of the Act, 21 U.S.C. section 360bbb-3(b)(1), unless the authorization is terminated or revoked sooner. Performed at [facility], [place] [address], [place], [place] [address]   Urine culture     Status:  Abnormal   Collection Time: 11/24/18  3:52 PM   Specimen: Urine, Random  Result Value Ref Range Status   Specimen Description URINE, RANDOM  Final   Special Requests   Final    SITE NOT SPECIFIED Performed at [facility], [name] [address], [place], [place] [address]    Culture >=100,000 COLONIES/mL KLEBSIELLA OXYTOCA (A)  Final   Report Status 11/26/2018 FINAL  Final   Organism ID, Bacteria KLEBSIELLA OXYTOCA (A)  Final      Susceptibility   Klebsiella oxytoca - MIC*    AMPICILLIN >=32 RESISTANT Resistant     CEFAZOLIN 8 SENSITIVE Sensitive     CEFTRIAXONE <=1 SENSITIVE Sensitive     CIPROFLOXACIN <=0.25 SENSITIVE Sensitive     GENTAMICIN <=1 SENSITIVE Sensitive     IMIPENEM <=0.25 SENSITIVE Sensitive     NITROFURANTOIN <=16 SENSITIVE Sensitive     TRIMETH/SULFA <=20 SENSITIVE Sensitive     AMPICILLIN/SULBACTAM 16 INTERMEDIATE Intermediate     PIP/TAZO <=4 SENSITIVE Sensitive     Extended ESBL NEGATIVE Sensitive     * >=100,000 COLONIES/mL KLEBSIELLA OXYTOCA  Culture, blood (routine x 2)     Status: None (Preliminary result)   Collection Time: 11/24/18  3:52 PM   Specimen: BLOOD RIGHT HAND  Result Value Ref Range Status   Specimen Description BLOOD RIGHT HAND  Final   Special Requests   Final    BOTTLES DRAWN AEROBIC AND ANAEROBIC Blood Culture adequate volume   Culture   Final    NO GROWTH 3 DAYS Performed at [facility], [address], [place], [place] [address]    Report Status PENDING  Incomplete  Culture, blood (routine x 2)     Status: None (Preliminary result)   Collection Time: 11/24/18  6:07 PM   Specimen: BLOOD RIGHT WRIST  Result Value Ref Range Status   Specimen Description BLOOD RIGHT WRIST  Final   Special Requests   Final    BOTTLES DRAWN AEROBIC AND ANAEROBIC Blood Culture results may not be optimal due to an inadequate volume of blood received in culture bottles   Culture   Final    NO GROWTH 3 DAYS Performed at [facility],  [place] [address] [place], [place] [address]    Report Status PENDING  Incomplete     Labs: BNP (last 3  results) No results for input(s): BNP in the last 8760 hours. Basic Metabolic Panel: Recent Labs  Lab 11/24/18 1409 11/25/18 0158  NA 137 138  K 4.5 3.9  CL 103 107  CO2 23 23  GLUCOSE 250* 188*  BUN 19 18  CREATININE 1.31* 1.10  CALCIUM 8.9 8.5*   Liver Function Tests: Recent Labs  Lab 11/24/18 1409  AST 15  ALT 18  ALKPHOS 60  BILITOT 1.2  PROT 6.4*  ALBUMIN 3.3*   No results for input(s): LIPASE, AMYLASE in the last 168 hours. No results for input(s): AMMONIA in the last 168 hours. CBC: Recent Labs  Lab 11/24/18 1409 11/25/18 0158 11/27/18 0812  WBC 22.6* 22.9* 4.9  NEUTROABS 19.2*  --   --   HGB 13.8 12.1* 11.5*  HCT 40.5 35.4* 33.3*  MCV 96.0 95.7 94.1  PLT 201 180 176   Cardiac Enzymes: No results for input(s): CKTOTAL, CKMB, CKMBINDEX, TROPONINI in the last 168 hours. BNP: Invalid input(s): POCBNP CBG: Recent Labs  Lab 11/26/18 0712 11/26/18 1144 11/26/18 1625 11/26/18 2056 11/27/18 0657  GLUCAP 196* 281* 262* 217* 176*   D-Dimer No results for input(s): DDIMER in the last 72 hours. Hgb A1c Recent Labs    11/24/18 1409  HGBA1C 8.3*   Lipid Profile No results for input(s): CHOL, HDL, LDLCALC, TRIG, CHOLHDL, LDLDIRECT in the last 72 hours. Thyroid function studies No results for input(s): TSH, T4TOTAL, T3FREE, THYROIDAB in the last 72 hours.  Invalid input(s): FREET3 Anemia work up No results for input(s): VITAMINB12, FOLATE, FERRITIN, TIBC, IRON, RETICCTPCT in the last 72 hours. Urinalysis    Component Value Date/Time   COLORURINE YELLOW 11/24/2018 1645   APPEARANCEUR HAZY (A) 11/24/2018 1645   LABSPEC 1.016 11/24/2018 1645   PHURINE 5.0 11/24/2018 1645   GLUCOSEU 150 (A) 11/24/2018 1645   GLUCOSEU NEGATIVE 05/16/2013 1009   HGBUR SMALL (A) 11/24/2018 1645   BILIRUBINUR NEGATIVE 11/24/2018 1645   BILIRUBINUR Negative 04/18/2018  1020   KETONESUR 5 (A) 11/24/2018 1645   PROTEINUR 30 (A) 11/24/2018 1645   UROBILINOGEN 1.0 04/18/2018 1020   UROBILINOGEN 0.2 05/16/2013 1009   NITRITE NEGATIVE 11/24/2018 1645   LEUKOCYTESUR MODERATE (A) 11/24/2018 1645   Sepsis Labs Invalid input(s): PROCALCITONIN,  WBC,  LACTICIDVEN Microbiology Recent Results (from the past 240 hour(s))  SARS Coronavirus 2 [facility] order, Performed in [facility] hospital lab) Nasopharyngeal Nasopharyngeal Swab     Status: None   Collection Time: 11/24/18  2:47 PM   Specimen: Nasopharyngeal Swab  Result Value Ref Range Status   SARS Coronavirus 2 NEGATIVE NEGATIVE Final    Comment: (NOTE) If result is NEGATIVE SARS-CoV-2 target nucleic acids are NOT DETECTED. The SARS-CoV-2 RNA is generally detectable in upper and lower  respiratory specimens during the acute phase of infection. The lowest  concentration of SARS-CoV-2 viral copies this assay can detect is 250  copies / mL. A negative result does not preclude SARS-CoV-2 infection  and should not be used as the sole basis for treatment or other  patient management decisions.  A negative result may occur with  improper specimen collection / handling, submission of specimen other  than nasopharyngeal swab, presence of viral mutation(s) within the  areas targeted by this assay, and inadequate number of viral copies  (<250 copies / mL). A negative result must be combined with clinical  observations, patient history, and epidemiological information. If result is POSITIVE SARS-CoV-2 target nucleic acids are DETECTED. The SARS-CoV-2 RNA is generally  detectable in upper and lower  respiratory specimens dur ing the acute phase of infection.  Positive  results are indicative of active infection with SARS-CoV-2.  Clinical  correlation with patient history and other diagnostic information is  necessary to determine patient infection status.  Positive results do  not rule out bacterial infection or  co-infection with other viruses. If result is PRESUMPTIVE POSTIVE SARS-CoV-2 nucleic acids MAY BE PRESENT.   A presumptive positive result was obtained on the submitted specimen  and confirmed on repeat testing.  While 2019 novel coronavirus  (SARS-CoV-2) nucleic acids may be present in the submitted sample  additional confirmatory testing may be necessary for epidemiological  and / or clinical management purposes  to differentiate between  SARS-CoV-2 and other Sarbecovirus currently known to infect humans.  If clinically indicated additional testing with an alternate test  methodology [phone]) is advised. The SARS-CoV-2 RNA is generally  detectable in upper and lower respiratory sp ecimens during the acute  phase of infection. The expected result is Negative. Fact Sheet for Patients:  StrictlyIdeas.no Fact Sheet for Healthcare Providers: BankingDealers.co.za This test is not yet approved or cleared by the [place] FDA and has been authorized for detection and/or diagnosis of SARS-CoV-2 by FDA under an Emergency Use Authorization (EUA).  This EUA will remain in effect (meaning this test can be used) for the duration of the COVID-19 declaration under Section 564(b)(1) of the Act, 21 U.S.C. section 360bbb-3(b)(1), unless the authorization is terminated or revoked sooner. Performed at [facility], [place] [address], [place], [place] [address]   Urine culture     Status: Abnormal   Collection Time: 11/24/18  3:52 PM   Specimen: Urine, Random  Result Value Ref Range Status   Specimen Description URINE, RANDOM  Final   Special Requests   Final    SITE NOT SPECIFIED Performed at [facility], [place] [address], [place], [place] [address]    Culture >=100,000 COLONIES/mL KLEBSIELLA OXYTOCA (A)  Final   Report Status 11/26/2018 FINAL  Final   Organism ID, Bacteria KLEBSIELLA OXYTOCA (A)  Final      Susceptibility   Klebsiella  oxytoca - MIC*    AMPICILLIN >=32 RESISTANT Resistant     CEFAZOLIN 8 SENSITIVE Sensitive     CEFTRIAXONE <=1 SENSITIVE Sensitive     CIPROFLOXACIN <=0.25 SENSITIVE Sensitive     GENTAMICIN <=1 SENSITIVE Sensitive     IMIPENEM <=0.25 SENSITIVE Sensitive     NITROFURANTOIN <=16 SENSITIVE Sensitive     TRIMETH/SULFA <=20 SENSITIVE Sensitive     AMPICILLIN/SULBACTAM 16 INTERMEDIATE Intermediate     PIP/TAZO <=4 SENSITIVE Sensitive     Extended ESBL NEGATIVE Sensitive     * >=100,000 COLONIES/mL KLEBSIELLA OXYTOCA  Culture, blood (routine x 2)     Status: None (Preliminary result)   Collection Time: 11/24/18  3:52 PM   Specimen: BLOOD RIGHT HAND  Result Value Ref Range Status   Specimen Description BLOOD RIGHT HAND  Final   Special Requests   Final    BOTTLES DRAWN AEROBIC AND ANAEROBIC Blood Culture adequate volume   Culture   Final    NO GROWTH 3 DAYS Performed at [facility], [address], [place],  [address]    Report Status PENDING  Incomplete  Culture, blood (routine x 2)     Status: None (Preliminary result)   Collection Time: 11/24/18  6:07 PM   Specimen: BLOOD RIGHT WRIST  Result Value Ref  Range Status   Specimen Description BLOOD RIGHT WRIST  Final   Special Requests   Final    BOTTLES DRAWN AEROBIC AND ANAEROBIC Blood Culture results may not be optimal due to an inadequate volume of blood received in culture bottles   Culture   Final    NO GROWTH 3 DAYS Performed at [facility], [place] [address], [place], [place] [address]    Report Status PENDING  Incomplete     Time coordinating discharge:  I have spent 35 minutes face to face with the patient and on the ward discussing the patients care, assessment, plan and disposition with other care givers. >50% of the time was devoted counseling the patient about the risks and benefits of treatment/Discharge disposition and coordinating care.   SIGNED:   Damita Lack, MD  [facility] 11/27/2018, 11:14 AM   If 7PM-7AM, please contact night-coverage [email or web address]

## 2018-11-27 NOTE — Progress Notes (Signed)
DISCHARGE NOTE HOME Fred [name] to be discharged Home per MD order. Discussed prescriptions and follow up appointments with the patient. Prescriptions given to patient; medication list explained in detail. Patient verbalized understanding.  Skin clean, dry and intact without evidence of skin break down, no evidence of skin tears noted. IV catheter discontinued intact. Site without signs and symptoms of complications. Dressing and pressure applied. Pt denies pain at the site currently. No complaints noted.  Patient free of lines, and wounds. Per daughter's request for pt to be DC with condemn cath for ride home.   An After Visit Summary (AVS) was printed and given to the patient. Patient escorted via wheelchair, and discharged home via private auto.  Paulla Fore, RN

## 2018-11-28 DIAGNOSIS — A419 Sepsis, unspecified organism: Secondary | ICD-10-CM | POA: Diagnosis not present

## 2018-11-28 DIAGNOSIS — Z86018 Personal history of other benign neoplasm: Secondary | ICD-10-CM | POA: Diagnosis not present

## 2018-11-28 DIAGNOSIS — N390 Urinary tract infection, site not specified: Secondary | ICD-10-CM | POA: Diagnosis not present

## 2018-11-28 DIAGNOSIS — D649 Anemia, unspecified: Secondary | ICD-10-CM | POA: Diagnosis not present

## 2018-11-28 DIAGNOSIS — I4891 Unspecified atrial fibrillation: Secondary | ICD-10-CM | POA: Diagnosis not present

## 2018-11-28 DIAGNOSIS — K5730 Diverticulosis of large intestine without perforation or abscess without bleeding: Secondary | ICD-10-CM | POA: Diagnosis not present

## 2018-11-28 DIAGNOSIS — M542 Cervicalgia: Secondary | ICD-10-CM | POA: Diagnosis not present

## 2018-11-28 DIAGNOSIS — R000 Tachycardia, unspecified: Secondary | ICD-10-CM | POA: Diagnosis not present

## 2018-11-28 DIAGNOSIS — Z794 Long term (current) use of insulin: Secondary | ICD-10-CM | POA: Diagnosis not present

## 2018-11-28 DIAGNOSIS — M25462 Effusion, left knee: Secondary | ICD-10-CM | POA: Diagnosis not present

## 2018-11-28 DIAGNOSIS — I2510 Atherosclerotic heart disease of native coronary artery without angina pectoris: Secondary | ICD-10-CM | POA: Diagnosis not present

## 2018-11-28 DIAGNOSIS — I69354 Hemiplegia and hemiparesis following cerebral infarction affecting left non-dominant side: Secondary | ICD-10-CM | POA: Diagnosis not present

## 2018-11-28 DIAGNOSIS — I5032 Chronic diastolic (congestive) heart failure: Secondary | ICD-10-CM | POA: Diagnosis not present

## 2018-11-28 DIAGNOSIS — Z7902 Long term (current) use of antithrombotics/antiplatelets: Secondary | ICD-10-CM | POA: Diagnosis not present

## 2018-11-28 DIAGNOSIS — E785 Hyperlipidemia, unspecified: Secondary | ICD-10-CM | POA: Diagnosis not present

## 2018-11-28 DIAGNOSIS — M170 Bilateral primary osteoarthritis of knee: Secondary | ICD-10-CM | POA: Diagnosis not present

## 2018-11-28 DIAGNOSIS — E1142 Type 2 diabetes mellitus with diabetic polyneuropathy: Secondary | ICD-10-CM | POA: Diagnosis not present

## 2018-11-28 DIAGNOSIS — G40209 Localization-related (focal) (partial) symptomatic epilepsy and epileptic syndromes with complex partial seizures, not intractable, without status epilepticus: Secondary | ICD-10-CM | POA: Diagnosis not present

## 2018-11-28 DIAGNOSIS — Z9841 Cataract extraction status, right eye: Secondary | ICD-10-CM | POA: Diagnosis not present

## 2018-11-28 DIAGNOSIS — Z951 Presence of aortocoronary bypass graft: Secondary | ICD-10-CM | POA: Diagnosis not present

## 2018-11-28 DIAGNOSIS — J9811 Atelectasis: Secondary | ICD-10-CM | POA: Diagnosis not present

## 2018-11-28 DIAGNOSIS — E1151 Type 2 diabetes mellitus with diabetic peripheral angiopathy without gangrene: Secondary | ICD-10-CM | POA: Diagnosis not present

## 2018-11-28 DIAGNOSIS — E669 Obesity, unspecified: Secondary | ICD-10-CM | POA: Diagnosis not present

## 2018-11-28 DIAGNOSIS — Z9089 Acquired absence of other organs: Secondary | ICD-10-CM | POA: Diagnosis not present

## 2018-11-28 DIAGNOSIS — I110 Hypertensive heart disease with heart failure: Secondary | ICD-10-CM | POA: Diagnosis not present

## 2018-11-29 ENCOUNTER — Inpatient Hospital Stay: Payer: Medicare Other | Admitting: Family Medicine

## 2018-11-29 DIAGNOSIS — I69354 Hemiplegia and hemiparesis following cerebral infarction affecting left non-dominant side: Secondary | ICD-10-CM | POA: Diagnosis not present

## 2018-11-29 DIAGNOSIS — A419 Sepsis, unspecified organism: Secondary | ICD-10-CM | POA: Diagnosis not present

## 2018-11-29 DIAGNOSIS — E1151 Type 2 diabetes mellitus with diabetic peripheral angiopathy without gangrene: Secondary | ICD-10-CM | POA: Diagnosis not present

## 2018-11-29 DIAGNOSIS — N390 Urinary tract infection, site not specified: Secondary | ICD-10-CM | POA: Diagnosis not present

## 2018-11-29 DIAGNOSIS — I110 Hypertensive heart disease with heart failure: Secondary | ICD-10-CM | POA: Diagnosis not present

## 2018-11-29 DIAGNOSIS — I5032 Chronic diastolic (congestive) heart failure: Secondary | ICD-10-CM | POA: Diagnosis not present

## 2018-11-29 LAB — CULTURE, BLOOD (ROUTINE X 2)
Culture: NO GROWTH
Culture: NO GROWTH
Special Requests: ADEQUATE

## 2018-11-30 DIAGNOSIS — E1151 Type 2 diabetes mellitus with diabetic peripheral angiopathy without gangrene: Secondary | ICD-10-CM | POA: Diagnosis not present

## 2018-11-30 DIAGNOSIS — I110 Hypertensive heart disease with heart failure: Secondary | ICD-10-CM | POA: Diagnosis not present

## 2018-11-30 DIAGNOSIS — I5032 Chronic diastolic (congestive) heart failure: Secondary | ICD-10-CM | POA: Diagnosis not present

## 2018-11-30 DIAGNOSIS — A419 Sepsis, unspecified organism: Secondary | ICD-10-CM | POA: Diagnosis not present

## 2018-11-30 DIAGNOSIS — N390 Urinary tract infection, site not specified: Secondary | ICD-10-CM | POA: Diagnosis not present

## 2018-11-30 DIAGNOSIS — I69354 Hemiplegia and hemiparesis following cerebral infarction affecting left non-dominant side: Secondary | ICD-10-CM | POA: Diagnosis not present

## 2018-12-03 DIAGNOSIS — E1151 Type 2 diabetes mellitus with diabetic peripheral angiopathy without gangrene: Secondary | ICD-10-CM | POA: Diagnosis not present

## 2018-12-03 DIAGNOSIS — I110 Hypertensive heart disease with heart failure: Secondary | ICD-10-CM | POA: Diagnosis not present

## 2018-12-03 DIAGNOSIS — N390 Urinary tract infection, site not specified: Secondary | ICD-10-CM | POA: Diagnosis not present

## 2018-12-03 DIAGNOSIS — A419 Sepsis, unspecified organism: Secondary | ICD-10-CM | POA: Diagnosis not present

## 2018-12-03 DIAGNOSIS — I5032 Chronic diastolic (congestive) heart failure: Secondary | ICD-10-CM | POA: Diagnosis not present

## 2018-12-03 DIAGNOSIS — I69354 Hemiplegia and hemiparesis following cerebral infarction affecting left non-dominant side: Secondary | ICD-10-CM | POA: Diagnosis not present

## 2018-12-04 ENCOUNTER — Encounter: Payer: Self-pay | Admitting: Family Medicine

## 2018-12-04 ENCOUNTER — Other Ambulatory Visit: Payer: Self-pay

## 2018-12-04 ENCOUNTER — Ambulatory Visit (INDEPENDENT_AMBULATORY_CARE_PROVIDER_SITE_OTHER): Payer: Medicare Other | Admitting: Family Medicine

## 2018-12-04 VITALS — BP 123/55 | HR 77

## 2018-12-04 DIAGNOSIS — E1169 Type 2 diabetes mellitus with other specified complication: Secondary | ICD-10-CM | POA: Diagnosis not present

## 2018-12-04 DIAGNOSIS — E669 Obesity, unspecified: Secondary | ICD-10-CM | POA: Diagnosis not present

## 2018-12-04 DIAGNOSIS — A419 Sepsis, unspecified organism: Secondary | ICD-10-CM | POA: Diagnosis not present

## 2018-12-04 DIAGNOSIS — N390 Urinary tract infection, site not specified: Secondary | ICD-10-CM

## 2018-12-04 DIAGNOSIS — I2510 Atherosclerotic heart disease of native coronary artery without angina pectoris: Secondary | ICD-10-CM

## 2018-12-04 DIAGNOSIS — E118 Type 2 diabetes mellitus with unspecified complications: Secondary | ICD-10-CM

## 2018-12-04 DIAGNOSIS — Z794 Long term (current) use of insulin: Secondary | ICD-10-CM | POA: Diagnosis not present

## 2018-12-04 NOTE — Patient Instructions (Signed)
Increase insulin to 22 units daily.

## 2018-12-04 NOTE — Assessment & Plan Note (Signed)
A1c jumped up pretty significantly compared to last time.  We will have him increase his Toujeo to 22 units daily if he started to have any hypoglycemic events then please call me back continue to work on healthy dietary choices and staying as active as he is able to though he is wheelchair-bound.

## 2018-12-04 NOTE — Progress Notes (Signed)
Established Patient Office Visit  Subjective:  Patient ID: Fred [name], male    DOB: 1935/05/03  Age: 83 y.o. MRN: [id number]  CC:  Chief Complaint  Patient presents with  . Hospitalization Follow-up    HPI Fred [name] presents for hospital follow-up.  He was admitted on September 12 through September 15 at [facility].  He initially presented with fatigue and weakness.  He was diagnosed with urinary tract infection causing sepsis secondary to Klebsiella.  It was sensitive to Rocephin and then he was transitioned to Keflex for an additional 4 days post discharge.  Since then he has been getting home health daily.  He is also been getting some home physical therapy he is feeling better overall.  Though he does not feel completely back to himself has not had any fevers chills or urinary symptoms.  Though he never had any urinary symptoms initially.  He has not had any new symptoms.  He says he did complete all of the antibiotic.  His white blood cell count upon admission was 22,000 but was down to 4000 before he left.  His initial renal function was also mildly elevated at 1.3 but was down to a creatinine of 1.1 before he left.  He was negative for COVID.  While hospitalized they did do a hemoglobin A1c.  His A1c was slightly elevated at 8.3, up from 7.1   He says he is using Toujeo 20 units.    His son-in-law brought him in today.    Past Medical History:  Diagnosis Date  . Atrial fibrillation ([place])    Post operative  . CAD (coronary artery disease)    s/p cabg  . CHF (congestive heart failure) ([place])   . Diverticulosis   . DJD (degenerative joint disease)   . DM (diabetes mellitus) ([place])   . History of anemia   . History of stroke   . Hyperlipidemia   . Hypertension   . 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 Peripheral vascular disease ([place])   . Polyneuropathy, diabetic ([place])   . Seizure disorder, complex partial ([place])   . Syncope and collapse      Past Surgical History:  Procedure Laterality Date  . CATARACT EXTRACTION W/ INTRAOCULAR LENS  IMPLANT, BILATERAL  06/2013   Dr. [name]  . CORONARY ARTERY BYPASS GRAFT  02/13/2004   4 vesel  . removal of sebaceous cyst from neck  08/2007   Dr. [name]  . TONSILLECTOMY      Family History  Problem Relation Age of Onset  . Epilepsy Father   . Stroke Mother   . Lung cancer Other     Social History   Socioeconomic History  . Marital status: Married    Spouse name: Tori Cupps  . Number of children: 3  . Years of education: Not on file  . Highest education level: Not on file  Occupational History  . 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 Financial resource strain: Not on file  . Food insecurity    Worry: Not on file    Inability: Not on file  . Transportation needs    Medical: Not on file    Non-medical: Not on file  Tobacco Use  . Smoking status: Former Smoker    Packs/day: 1.00    Years: 22.00    Pack years: 22.00    Types: Cigarettes  Quit date: 03/14/1977    Years since quitting: 41.7  . Smokeless tobacco: Never Used  Substance and Sexual Activity  . Alcohol use: Not Currently    Comment: Pt has not had alcohol since 03/2017 CVA  . Drug use: No  . Sexual activity: Never  Lifestyle  . Physical activity    Days per week: Not on file    Minutes per session: Not on file  . Stress: Not on file  Relationships  . Social Herbalist on phone: Not on file    Gets together: Not on file    Attends religious service: Not on file    Active member of club or organization: Not on file    Attends meetings of clubs or organizations: Not on file    Relationship status: Not on file  . Intimate partner violence    Fear of current or ex partner: Not on file    Emotionally abused: Not on file    Physically abused: Not on file    Forced sexual activity: Not on file  Other Topics Concern   . Not on file  Social History Narrative   3 caffeinated drinks per day. Mostly coffee. He walks 15 minutes 3 days per week. Quit smoking in 1978.      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     Outpatient Medications Prior to Visit  Medication Sig Dispense Refill  . acetaminophen (TYLENOL) 325 MG tablet Take 2 tablets (650 mg total) by mouth every 4 (four) hours as needed for mild pain (or temp > 37.5 C (99.5 F)).    Marland Kitchen allopurinol (ZYLOPRIM) 300 MG tablet Take 1 tablet (300 mg total) by mouth daily. 90 tablet 1  . atorvastatin (LIPITOR) 80 MG tablet Take 1 tablet (80 mg total) by mouth daily. 90 tablet 3  . blood glucose meter kit and supplies Check blood sugar 3 times daily. 1 each 0  . clopidogrel (PLAVIX) 75 MG tablet Take 1 tablet (75 mg total) by mouth daily. 90 tablet 3  . Coenzyme Q10 (COQ10) 100 MG CAPS Take 300 mg by mouth daily.     . divalproex (DEPAKOTE ER) 500 MG 24 hr tablet Take 1 tablet (500 mg total) by mouth daily. 90 tablet 3  . ezetimibe (ZETIA) 10 MG tablet Take 1 tablet (10 mg total) by mouth daily. 90 tablet 3  . furosemide (LASIX) 20 MG tablet Take 1 tablet (20 mg total) by mouth daily. (Patient taking differently: Take 20 mg by mouth 3 (three) times a week. ) 90 tablet 1  . Insulin Glargine, 1 Unit Dial, (TOUJEO SOLOSTAR) 300 UNIT/ML SOPN Inject 10-20 Units into the skin at bedtime. 2 pen 5  . Insulin Pen Needle (NOVOFINE) 32G X 6 MM MISC Inject insulin daily 100 each prn  . Lancets (ONETOUCH DELICA PLUS [id number]) MISC     . losartan (COZAAR) 25 MG tablet TAKE 1 TABLET BY MOUTH DAILY (Patient taking differently: Take 25 mg by mouth daily. ) 90 tablet 1  . metFORMIN (GLUCOPHAGE) 500 MG tablet TAKE 1 TABLET BY MOUTH TWICE DAILY WITH A MEAL (Patient taking differently: Take 500 mg by mouth 2 (two) times daily with a meal. ) 180 tablet 1  . metoprolol succinate (TOPROL-XL) 50 MG 24  hr tablet TAKE 1 TABLET BY MOUTH DAILY IMMEDIATELY FOLLOWING A MEAL (Patient taking differently:  Take 50 mg by mouth daily. ) 90 tablet 1  . niacin (NIASPAN) 500 MG CR tablet Take 1 tablet (500 mg total) by mouth at bedtime. 90 tablet 3  . ONE TOUCH ULTRA TEST test strip     . senna-docusate (SENOKOT-S) 8.6-50 MG tablet Take 1 tablet by mouth 2 (two) times daily. (Patient taking differently: Take 1 tablet by mouth as needed for mild constipation. )    . TRADJENTA 5 MG TABS tablet TAKE 1 TABLET BY MOUTH DAILY (Patient taking differently: Take 5 mg by mouth daily. ) 90 tablet 1   No facility-administered medications prior to visit.     Allergies  Allergen Reactions  . Lisinopril Cough    ROS Review of Systems    Objective:    Physical Exam  Constitutional: He is oriented to person, place, and time. He appears well-developed and well-nourished.  Sitting in electric wheelchair.   HENT:  Head: Normocephalic and atraumatic.  Cardiovascular: Normal rate, regular rhythm and normal heart sounds.  Pulmonary/Chest: Effort normal and breath sounds normal.  Neurological: He is alert and oriented to person, place, and time.  Skin: Skin is warm and dry.  Psychiatric: He has a normal mood and affect. His behavior is normal.    BP (!) 123/55   Pulse 77   SpO2 97%  Wt Readings from Last 3 Encounters:  11/26/18 165 lb 2 oz (74.9 kg)  07/31/18 161 lb (73 kg)  06/01/18 165 lb (74.8 kg)     There are no preventive care reminders to display for this patient.  There are no preventive care reminders to display for this patient.  Lab Results  Component Value Date   TSH 0.99 03/28/2018   Lab Results  Component Value Date   WBC 4.9 11/27/2018   HGB 11.5 (L) 11/27/2018   HCT 33.3 (L) 11/27/2018   MCV 94.1 11/27/2018   PLT 176 11/27/2018   Lab Results  Component Value Date   NA 138 11/25/2018   K 3.9 11/25/2018   CO2 23 11/25/2018   GLUCOSE 188 (H) 11/25/2018   BUN 18 11/25/2018    CREATININE 1.10 11/25/2018   BILITOT 1.2 11/24/2018   ALKPHOS 60 11/24/2018   AST 15 11/24/2018   ALT 18 11/24/2018   PROT 6.4 (L) 11/24/2018   ALBUMIN 3.3 (L) 11/24/2018   CALCIUM 8.5 (L) 11/25/2018   ANIONGAP 8 11/25/2018   GFR 84.99 05/16/2013   Lab Results  Component Value Date   CHOL 195 08/10/2017   Lab Results  Component Value Date   HDL 40 (L) 08/10/2017   Lab Results  Component Value Date   LDLCALC 130 (H) 08/10/2017   Lab Results  Component Value Date   TRIG 125 08/10/2017   Lab Results  Component Value Date   CHOLHDL 4.9 08/10/2017   Lab Results  Component Value Date   HGBA1C 8.3 (H) 11/24/2018      Assessment & Plan:   Problem List Items Addressed This Visit      Endocrine   Type 2 diabetes mellitus with complication, with long-term current use of insulin ([place])    A1c jumped up pretty significantly compared to last time.  We will have him increase his Toujeo to 22 units daily if he started to have any hypoglycemic events then please call me back continue to work on healthy dietary choices and staying as active as he is able to though he is wheelchair-bound.      Diabetes mellitus type  2 in obese [facility])     Genitourinary   UTI (urinary tract infection)    Repeat urine culture to make sure that the infection has completely cleared.        Other   Sepsis ([place]) - Primary    Due to recheck BMP and CBC.  Is feeling much better.  Afebrile.      Relevant Orders   CBC   BASIC METABOLIC PANEL WITH GFR      No orders of the defined types were placed in this encounter.   Follow-up: No follow-ups on file.    Beatrice Lecher, MD

## 2018-12-04 NOTE — Assessment & Plan Note (Signed)
Due to recheck BMP and CBC.  Is feeling much better.  Afebrile.

## 2018-12-04 NOTE — Assessment & Plan Note (Signed)
Repeat urine culture to make sure that the infection has completely cleared.

## 2018-12-05 ENCOUNTER — Telehealth: Payer: Self-pay

## 2018-12-05 DIAGNOSIS — I110 Hypertensive heart disease with heart failure: Secondary | ICD-10-CM | POA: Diagnosis not present

## 2018-12-05 DIAGNOSIS — E1151 Type 2 diabetes mellitus with diabetic peripheral angiopathy without gangrene: Secondary | ICD-10-CM | POA: Diagnosis not present

## 2018-12-05 DIAGNOSIS — A419 Sepsis, unspecified organism: Secondary | ICD-10-CM | POA: Diagnosis not present

## 2018-12-05 DIAGNOSIS — I69354 Hemiplegia and hemiparesis following cerebral infarction affecting left non-dominant side: Secondary | ICD-10-CM | POA: Diagnosis not present

## 2018-12-05 DIAGNOSIS — N390 Urinary tract infection, site not specified: Secondary | ICD-10-CM | POA: Diagnosis not present

## 2018-12-05 DIAGNOSIS — I5032 Chronic diastolic (congestive) heart failure: Secondary | ICD-10-CM | POA: Diagnosis not present

## 2018-12-05 LAB — BASIC METABOLIC PANEL WITH GFR
BUN: 11 mg/dL (ref 7–25)
CO2: 27 mmol/L (ref 20–32)
Calcium: 9.1 mg/dL (ref 8.6–10.3)
Chloride: 106 mmol/L (ref 98–110)
Creat: 0.91 mg/dL (ref 0.70–1.11)
GFR, Est African American: 91 mL/min/{1.73_m2} (ref >=60)
GFR, Est Non African American: 79 mL/min/{1.73_m2} (ref >=60)
Glucose, Bld: 148 mg/dL — ABNORMAL HIGH (ref 65–99)
Potassium: 4.7 mmol/L (ref 3.5–5.3)
Sodium: 145 mmol/L (ref 135–146)

## 2018-12-05 LAB — CBC
HCT: 36.8 % — ABNORMAL LOW (ref 38.5–50.0)
Hemoglobin: 12.4 g/dL — ABNORMAL LOW (ref 13.2–17.1)
MCH: 31.7 pg (ref 27.0–33.0)
MCHC: 33.7 g/dL (ref 32.0–36.0)
MCV: 94.1 fL (ref 80.0–100.0)
MPV: 10.5 fL (ref 7.5–12.5)
Platelets: 427 10*3/uL — ABNORMAL HIGH (ref 140–400)
RBC: 3.91 10*6/uL — ABNORMAL LOW (ref 4.20–5.80)
RDW: 12.9 % (ref 11.0–15.0)
WBC: 8.1 10*3/uL (ref 3.8–10.8)

## 2018-12-05 NOTE — Addendum Note (Signed)
Addended by: Teddy Spike on: 12/05/2018 03:39 PM   Modules accepted: Orders

## 2018-12-05 NOTE — Telephone Encounter (Signed)
Fred [name] with Home Health called and left a message asking for verbal orders for OT.

## 2018-12-05 NOTE — Telephone Encounter (Signed)
OK for verbal order 

## 2018-12-05 NOTE — Telephone Encounter (Signed)
Verbal provided for 1x week OT to assist with ADLs.

## 2018-12-06 DIAGNOSIS — I110 Hypertensive heart disease with heart failure: Secondary | ICD-10-CM | POA: Diagnosis not present

## 2018-12-06 DIAGNOSIS — N390 Urinary tract infection, site not specified: Secondary | ICD-10-CM | POA: Diagnosis not present

## 2018-12-06 DIAGNOSIS — A419 Sepsis, unspecified organism: Secondary | ICD-10-CM | POA: Diagnosis not present

## 2018-12-06 DIAGNOSIS — E1151 Type 2 diabetes mellitus with diabetic peripheral angiopathy without gangrene: Secondary | ICD-10-CM | POA: Diagnosis not present

## 2018-12-06 DIAGNOSIS — I5032 Chronic diastolic (congestive) heart failure: Secondary | ICD-10-CM | POA: Diagnosis not present

## 2018-12-06 DIAGNOSIS — I69354 Hemiplegia and hemiparesis following cerebral infarction affecting left non-dominant side: Secondary | ICD-10-CM | POA: Diagnosis not present

## 2018-12-07 ENCOUNTER — Telehealth: Payer: Self-pay

## 2018-12-07 DIAGNOSIS — A419 Sepsis, unspecified organism: Secondary | ICD-10-CM | POA: Diagnosis not present

## 2018-12-07 DIAGNOSIS — N390 Urinary tract infection, site not specified: Secondary | ICD-10-CM | POA: Diagnosis not present

## 2018-12-07 DIAGNOSIS — I5032 Chronic diastolic (congestive) heart failure: Secondary | ICD-10-CM | POA: Diagnosis not present

## 2018-12-07 DIAGNOSIS — E1151 Type 2 diabetes mellitus with diabetic peripheral angiopathy without gangrene: Secondary | ICD-10-CM | POA: Diagnosis not present

## 2018-12-07 DIAGNOSIS — I69354 Hemiplegia and hemiparesis following cerebral infarction affecting left non-dominant side: Secondary | ICD-10-CM | POA: Diagnosis not present

## 2018-12-07 DIAGNOSIS — I110 Hypertensive heart disease with heart failure: Secondary | ICD-10-CM | POA: Diagnosis not present

## 2018-12-07 LAB — URINE CULTURE
MICRO NUMBER:: 914829
SPECIMEN QUALITY:: ADEQUATE

## 2018-12-07 MED ORDER — SULFAMETHOXAZOLE-TRIMETHOPRIM 800-160 MG PO TABS: 1.0000 | ORAL_TABLET | Freq: Two times a day (BID) | ORAL | 0 refills | Status: DC

## 2018-12-07 NOTE — Telephone Encounter (Signed)
Fred [name] called and states her dad's symptoms have returned. She reports strong odor in urine, blood in urine and painful voiding. Please advise.

## 2018-12-07 NOTE — Telephone Encounter (Signed)
OK, I will send over a new antibiotic.  I would normally prefer to get a sample and a culture for confirmation but since it is Friday evening I will go ahead and send over the antibiotic.

## 2018-12-07 NOTE — Telephone Encounter (Signed)
Pt's daughter advised.  

## 2018-12-08 ENCOUNTER — Emergency Department (HOSPITAL_COMMUNITY)
Admission: EM | Admit: 2018-12-08 | Discharge: 2018-12-08 | Disposition: A | Discharge status: Home / Self Care | Payer: Medicare Other | Attending: Emergency Medicine | Admitting: Emergency Medicine

## 2018-12-08 ENCOUNTER — Other Ambulatory Visit: Payer: Self-pay

## 2018-12-08 ENCOUNTER — Encounter (HOSPITAL_COMMUNITY): Payer: Self-pay

## 2018-12-08 DIAGNOSIS — I110 Hypertensive heart disease with heart failure: Secondary | ICD-10-CM | POA: Diagnosis not present

## 2018-12-08 DIAGNOSIS — I5032 Chronic diastolic (congestive) heart failure: Secondary | ICD-10-CM | POA: Insufficient documentation

## 2018-12-08 DIAGNOSIS — Z87891 Personal history of nicotine dependence: Secondary | ICD-10-CM | POA: Diagnosis not present

## 2018-12-08 DIAGNOSIS — Z79899 Other long term (current) drug therapy: Secondary | ICD-10-CM | POA: Diagnosis not present

## 2018-12-08 DIAGNOSIS — I959 Hypotension, unspecified: Secondary | ICD-10-CM | POA: Diagnosis not present

## 2018-12-08 DIAGNOSIS — N3001 Acute cystitis with hematuria: Secondary | ICD-10-CM | POA: Diagnosis not present

## 2018-12-08 DIAGNOSIS — I2510 Atherosclerotic heart disease of native coronary artery without angina pectoris: Secondary | ICD-10-CM | POA: Diagnosis not present

## 2018-12-08 DIAGNOSIS — Z7901 Long term (current) use of anticoagulants: Secondary | ICD-10-CM | POA: Insufficient documentation

## 2018-12-08 DIAGNOSIS — R300 Dysuria: Secondary | ICD-10-CM | POA: Insufficient documentation

## 2018-12-08 DIAGNOSIS — Z951 Presence of aortocoronary bypass graft: Secondary | ICD-10-CM | POA: Insufficient documentation

## 2018-12-08 DIAGNOSIS — R0902 Hypoxemia: Secondary | ICD-10-CM | POA: Diagnosis not present

## 2018-12-08 DIAGNOSIS — R319 Hematuria, unspecified: Secondary | ICD-10-CM | POA: Diagnosis not present

## 2018-12-08 LAB — CBC WITH DIFFERENTIAL/PLATELET
Abs Immature Granulocytes: 0.12 10*3/uL — ABNORMAL HIGH (ref 0.00–0.07)
Basophils Absolute: 0.1 10*3/uL (ref 0.0–0.1)
Basophils Relative: 1 %
Eosinophils Absolute: 0.1 10*3/uL (ref 0.0–0.5)
Eosinophils Relative: 1 %
HCT: 38.7 % — ABNORMAL LOW (ref 39.0–52.0)
Hemoglobin: 12.7 g/dL — ABNORMAL LOW (ref 13.0–17.0)
Immature Granulocytes: 1 %
Lymphocytes Relative: 16 %
Lymphs Abs: 1.8 10*3/uL (ref 0.7–4.0)
MCH: 32.1 pg (ref 26.0–34.0)
MCHC: 32.8 g/dL (ref 30.0–36.0)
MCV: 97.7 fL (ref 80.0–100.0)
Monocytes Absolute: 1 10*3/uL (ref 0.1–1.0)
Monocytes Relative: 9 %
Neutro Abs: 8.3 10*3/uL — ABNORMAL HIGH (ref 1.7–7.7)
Neutrophils Relative %: 72 %
Platelets: 381 10*3/uL (ref 150–400)
RBC: 3.96 MIL/uL — ABNORMAL LOW (ref 4.22–5.81)
RDW: 13.3 % (ref 11.5–15.5)
WBC: 11.4 10*3/uL — ABNORMAL HIGH (ref 4.0–10.5)
nRBC: 0 % (ref 0.0–0.2)

## 2018-12-08 LAB — URINALYSIS, ROUTINE W REFLEX MICROSCOPIC
Bilirubin Urine: NEGATIVE
Glucose, UA: NEGATIVE mg/dL
Ketones, ur: 20 mg/dL — AB
Nitrite: NEGATIVE
Protein, ur: 100 mg/dL — AB
RBC / HPF: >50 RBC/hpf — ABNORMAL HIGH (ref 0–5)
Specific Gravity, Urine: 1.016 (ref 1.005–1.030)
WBC, UA: >50 WBC/hpf — ABNORMAL HIGH (ref 0–5)
pH: 6 (ref 5.0–8.0)

## 2018-12-08 LAB — URINALYSIS, MICROSCOPIC (REFLEX)
Bacteria, UA: NONE SEEN
RBC / HPF: >50 RBC/hpf (ref 0–5)
Squamous Epithelial / HPF: NONE SEEN (ref 0–5)
WBC, UA: NONE SEEN WBC/hpf (ref 0–5)

## 2018-12-08 LAB — BASIC METABOLIC PANEL
Anion gap: 11 (ref 5–15)
BUN: 8 mg/dL (ref 8–23)
CO2: 26 mmol/L (ref 22–32)
Calcium: 9.1 mg/dL (ref 8.9–10.3)
Chloride: 104 mmol/L (ref 98–111)
Creatinine, Ser: 0.87 mg/dL (ref 0.61–1.24)
GFR calc Af Amer: >60 mL/min (ref >60)
GFR calc non Af Amer: >60 mL/min (ref >60)
Glucose, Bld: 131 mg/dL — ABNORMAL HIGH (ref 70–99)
Potassium: 4.2 mmol/L (ref 3.5–5.1)
Sodium: 141 mmol/L (ref 135–145)

## 2018-12-08 LAB — VALPROIC ACID LEVEL: Valproic Acid Lvl: 14 ug/mL — ABNORMAL LOW (ref 50.0–100.0)

## 2018-12-08 LAB — LACTIC ACID, PLASMA: Lactic Acid, Venous: 0.9 mmol/L (ref 0.5–1.9)

## 2018-12-08 MED ORDER — SODIUM CHLORIDE 0.9 % IV SOLN
1.0000 g | Freq: Once | INTRAVENOUS | Status: AC
  Administered 2018-12-08: 18:00:00 1 g via INTRAVENOUS
  Filled 2018-12-08: qty 10

## 2018-12-08 MED ORDER — SODIUM CHLORIDE 0.9 % IV BOLUS
500.0000 mL | Freq: Once | INTRAVENOUS | Status: AC
  Administered 2018-12-08: 16:00:00 500 mL via INTRAVENOUS

## 2018-12-08 NOTE — Discharge Instructions (Addendum)
Your Depakote level is low today (14), follow up with your doctor for further medication recommendations.  Take Bactrim as prescribed by your doctor. Follow up with your doctor for recheck. Home to rest, stay hydrated.   Return to the ER for inability to urinate, fevers, confusion, any worsening or concerning symptoms.

## 2018-12-08 NOTE — ED Notes (Signed)
Patint is being discharged with daughter who is alseo the caregiver. The patient and caregiver were able to teach back the education provided. The patient is stable at the time of departure and complains of no pain.

## 2018-12-08 NOTE — ED Provider Notes (Signed)
[facility] EMERGENCY DEPARTMENT Provider Note   CSN: [id number] Arrival date & time: 12/08/18  1150     History   Chief Complaint Chief Complaint  Patient presents with   Hematuria    HPI Fred [name] is a 83 y.o. male.     83 year old male with past medical history of diabetes, CHF, CVA with left-sided deficits, paroxysmal A. fib, seizure disorder on Depakote presents from home, daughter at bedside, for gross hematuria, dysuria, urinary incontinence.  Symptoms started yesterday with some blood in his urine, family called PCP who called in antibiotics however family was unable to pick up the antibiotics yesterday evening and patient developed increasing blood in the urine which prompted ER visit today.  Patient was admitted to the hospital September 12-15 for generalized weakness, found to have sepsis from urinary tract infection from Klebsiella which was sensitive to the Rocephin he was treated with, discharged home on Keflex which he completed at home.  Denies fevers, chills, abdominal pain, weakness, confusion, changes in appetite or stool output.  Patient is on Plavix.  No other complaints or concerns.     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[place]) 09/25/2017    Chronic diastolic (congestive) heart failure (HCC)    Hemiparesis affecting left side as late effect of cerebrovascular accident ([place])    Diabetes mellitus type 2 in obese ([place])    Small vessel disease ([place]) 08/11/2017   Left hemiparesis (HCC)    CVA (cerebral vascular accident) ([place]) 08/10/2017   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 of localized onset, not intractable, without status epilepticus ([place]) 06/20/2017   Primary osteoarthritis of both knees 05/11/2017   Stroke (cerebrum) ([place]) 03/21/2017   Type 2 diabetes mellitus with complication, with long-term current use of insulin ([place]) 03/27/2015   Atherosclerosis of native coronary artery of native heart without angina pectoris 03/27/2015   Palpitations 11/04/2014   S/P CABG (coronary artery bypass graft) 11/04/2014   Atrial fibrillation, unspecified    History of stroke 09/04/2014   Obesity (BMI 30-39.9) 11/29/2013   History of gout 05/16/2013   CHF, chronic ([place]) 04/20/2009   CARDIOVASCULAR FUNCTION STUDY, ABNORMAL 01/29/2009   NECK PAIN 08/03/2007   ANEMIA / OTHER 05/16/2007   Coronary atherosclerosis 05/16/2007   PAROXYSMAL ATRIAL FIBRILLATION 05/16/2007   Subcortical infarction ([place]) 05/16/2007   Hyperlipidemia 03/30/2007   DEGENERATIVE JOINT DISEASE 03/30/2007   PAROTID LESION, UNSPECIFIED 09/29/2006   Partial epilepsy with impairment of consciousness ([place]) 09/29/2006   Diabetic polyneuropathy ([place]) 09/29/2006   Essential hypertension 09/29/2006   PERIPHERAL VASCULAR DISEASE 09/29/2006   DIVERTICULOSIS 09/29/2006    Past Surgical History:  Procedure Laterality Date   CATARACT EXTRACTION W/  INTRAOCULAR LENS  IMPLANT, BILATERAL  06/2013   Dr. [name]   CORONARY ARTERY BYPASS GRAFT  02/13/2004   4 vesel   removal of sebaceous cyst from neck  08/2007   Dr. [name]   TONSILLECTOMY          Home Medications    Prior to Admission  medications   Medication Sig Start Date End Date Taking? Authorizing Provider  acetaminophen (TYLENOL) 325 MG tablet Take 2 tablets (650 mg total) by mouth every 4 (four) hours as needed for mild pain (or temp > 37.5 C (99.5 F)). 09/13/17   Angiulli, Lavon Paganini, PA-C  allopurinol (ZYLOPRIM) 300 MG tablet Take 1 tablet (300 mg total) by mouth daily. 07/30/18   [name], MD  atorvastatin (LIPITOR) 80 MG tablet Take 1 tablet (80 mg total) by mouth daily. 11/14/18   [name], MD  blood glucose meter kit and supplies Check blood sugar 3 times daily. 04/12/18   [name], MD  clopidogrel (PLAVIX) 75 MG tablet Take 1 tablet (75 mg total) by mouth daily. 12/13/17   Cameron Sprang, MD  Coenzyme Q10 (COQ10) 100 MG CAPS Take 300 mg by mouth daily.     [provider]  divalproex (DEPAKOTE ER) 500 MG 24 hr tablet Take 1 tablet (500 mg total) by mouth daily. 08/01/18   Cameron Sprang, MD  ezetimibe (ZETIA) 10 MG tablet Take 1 tablet (10 mg total) by mouth daily. 11/23/18   Silverio Decamp, MD  furosemide (LASIX) 20 MG tablet Take 1 tablet (20 mg total) by mouth daily. Patient taking differently: Take 20 mg by mouth 3 (three) times a week.  07/30/18   [name], MD  Insulin Glargine, 1 Unit Dial, (TOUJEO SOLOSTAR) 300 UNIT/ML SOPN Inject 10-20 Units into the skin at bedtime. 10/26/18   [name], MD  Insulin Pen Needle (NOVOFINE) 32G X 6 MM MISC Inject insulin daily 04/13/18   [name], MD  Lancets (ONETOUCH DELICA PLUS GNFAOZ30Q) [place]  04/12/18   [provider]  losartan (COZAAR) 25 MG tablet TAKE 1 TABLET BY MOUTH DAILY Patient taking differently: Take 25 mg by mouth daily.  11/12/18   [name], MD  metFORMIN (GLUCOPHAGE) 500 MG tablet TAKE 1 TABLET BY MOUTH TWICE DAILY WITH A MEAL Patient taking differently: Take 500 mg by mouth 2 (two) times daily with a meal.  07/30/18   [name], MD  metoprolol  succinate (TOPROL-XL) 50 MG 24 hr tablet TAKE 1 TABLET BY MOUTH DAILY IMMEDIATELY FOLLOWING A MEAL Patient taking differently: Take 50 mg by mouth daily.  11/12/18   [name], MD  niacin (NIASPAN) 500 MG CR tablet Take 1 tablet (500 mg total) by mouth at bedtime. 11/23/18   Silverio Decamp, MD  ONE TOUCH ULTRA TEST test strip  04/12/18   [provider]  senna-docusate (SENOKOT-S) 8.6-50 MG tablet Take 1 tablet by mouth 2 (two) times daily. Patient taking differently: Take 1 tablet by mouth as needed for mild constipation.  09/13/17   Angiulli, Lavon Paganini, PA-C  sulfamethoxazole-trimethoprim (BACTRIM DS) 800-160 MG tablet Take 1 tablet by mouth 2 (two) times daily. 12/07/18   [name], MD  TRADJENTA 5 MG TABS tablet TAKE 1 TABLET BY MOUTH DAILY Patient taking differently: Take 5 mg by mouth daily.  11/21/18   [name], MD    Family History Family History  Problem Relation Age of  Onset   Epilepsy Father    Stroke Mother    Lung cancer Other     Social History Social History   Tobacco Use   Smoking status: Former Smoker    Packs/day: 1.00    Years: 22.00    Pack years: 22.00    Types: Cigarettes    Quit date: 03/14/1977    Years since quitting: 41.7   Smokeless tobacco: Never Used  Substance Use Topics   Alcohol use: Not Currently    Comment: Pt has not had alcohol since 03/2017 CVA   Drug use: No     Allergies   Lisinopril   Review of Systems Review of Systems  Constitutional: Negative for appetite change and fever.  Respiratory: Negative for shortness of breath.   Cardiovascular: Negative for chest pain.  Gastrointestinal: Negative for abdominal pain, constipation, diarrhea, nausea and vomiting.  Genitourinary: Positive for dysuria and hematuria.  Skin: Negative for rash and wound.  Allergic/Immunologic: Negative for immunocompromised state.  Neurological: Negative for weakness.  Psychiatric/Behavioral: Negative for  confusion.  All other systems reviewed and are negative.    Physical Exam Updated Vital Signs BP 127/68    Pulse 77    Temp 98.6 F (37 C) (Oral)    Resp 16    Ht '5\' 3"'$  (1.6 m)    Wt 77.1 kg    SpO2 99%    BMI 30.11 kg/m   Physical Exam Vitals signs and nursing note reviewed.  Constitutional:      General: He is not in acute distress.    Appearance: He is well-developed. He is not diaphoretic.  HENT:     Head: Normocephalic and atraumatic.  Cardiovascular:     Rate and Rhythm: Normal rate and regular rhythm.     Pulses: Normal pulses.     Heart sounds: Normal heart sounds.  Pulmonary:     Effort: Pulmonary effort is normal.     Breath sounds: Normal breath sounds.  Abdominal:     General: There is no distension.     Palpations: Abdomen is soft.     Tenderness: There is no abdominal tenderness.  Genitourinary:    Comments: Condom cath in place, placed in the ER just prior to my evaluation, bag empty. Musculoskeletal:     Right lower leg: No edema.     Left lower leg: No edema.  Skin:    General: Skin is warm and dry.     Findings: No erythema or rash.  Neurological:     Mental Status: He is alert and oriented to person, place, and time.  Psychiatric:        Behavior: Behavior normal.      ED Treatments / Results  Labs (all labs ordered are listed, but only abnormal results are displayed) Labs Reviewed  URINALYSIS, ROUTINE W REFLEX MICROSCOPIC - Abnormal; Notable for the following components:      Result Value   Color, Urine RED (*)    APPearance CLOUDY (*)    Glucose, UA   (*)    Value: TEST NOT REPORTED DUE TO COLOR INTERFERENCE OF URINE PIGMENT   Hgb urine dipstick   (*)    Value: TEST NOT REPORTED DUE TO COLOR INTERFERENCE OF URINE PIGMENT   Bilirubin Urine   (*)    Value: TEST NOT REPORTED DUE TO COLOR INTERFERENCE OF URINE PIGMENT   Ketones, ur   (*)    Value: TEST NOT REPORTED DUE TO COLOR INTERFERENCE OF URINE PIGMENT  Protein, ur   (*)    Value:  TEST NOT REPORTED DUE TO COLOR INTERFERENCE OF URINE PIGMENT   Nitrite   (*)    Value: TEST NOT REPORTED DUE TO COLOR INTERFERENCE OF URINE PIGMENT   Leukocytes,Ua   (*)    Value: TEST NOT REPORTED DUE TO COLOR INTERFERENCE OF URINE PIGMENT   All other components within normal limits  URINE CULTURE  CULTURE, BLOOD (ROUTINE X 2)  CULTURE, BLOOD (ROUTINE X 2)  URINALYSIS, MICROSCOPIC (REFLEX)  BASIC METABOLIC PANEL  CBC WITH DIFFERENTIAL/PLATELET  LACTIC ACID, PLASMA  LACTIC ACID, PLASMA  VALPROIC ACID LEVEL    EKG None  Radiology No results found.  Procedures Procedures (including critical care time)  Medications Ordered in ED Medications  sodium chloride 0.9 % bolus 500 mL (has no administration in time range)     Initial Impression / Assessment and Plan / ED Course  I have reviewed the triage vital signs and the nursing notes.  Pertinent labs & imaging results that were available during my care of the patient were reviewed by me and considered in my medical decision making (see chart for details).  Clinical Course as of Dec 07 2316  Sat Dec 08, 2018  1508 Hgb urine dipstick(!): TEST NOT REPORTED DUE TO COLOR INTERFERENCE OF URINE PIGMENT [SA]  1509 Leukocytes,Ua(!): TEST NOT REPORTED DUE TO COLOR INTERFERENCE OF URINE PIGMENT [SA]  7642 83 year old male presents from home with daughter at bedside, recently admitted to the hospital September 12 through the 15th for sepsis with urinary tract infection, discharged home and completed course of Keflex and was doing well.  Patient noticed dysuria with blood in his urinal last night, today reported frank blood with urinary incontinence.  Denies feeling weak or dizzy, abdominal pain, fevers, inability to urinate.  On exam, patient is well-appearing, his abdomen is soft and nontender, his bladder does not feel distended.  Patient has a condom cath Foley in place, no output at time of exam.  Patient had about a teaspoon of blood in  his urine collection, per daughter, which was sent for urinalysis, unable to complete urogram however he did show red blood cells, no white blood cells.  Suspect this report was due to sample was a clot of blood rather than a sample of urine.  Patient was able to pass urine which was sent for repeat urinalysis, shows large hemoglobin, ketones, protein, moderate leukocytes with greater than 50 red blood cells and white blood cells with rare bacteria.  I have requested a culture on this urine sample, as patient was symptomatic with dysuria prior to the onset of his symptoms, plan is to treat with antibiotics.  Patient was given Rocephin while in the ER.  Review of last culture report, culture grew greater than 100,000 Klebsiella and was treated with Keflex.  Patient has a prescription for Bactrim at his pharmacy called in by his PCP yesterday, prior culture report shows sensitivity to Bactrim, plan is for patient to take this as prescribed and follow-up with his PCP.  Did mention subtherapeutic Depakote level and recommend follow-up with PCP for med management.  CBC with mild leukocytosis with white count of 11.4, BMP is unremarkable with normal renal function.   [LM]  1818 Follow-up with PCP, return to ER for any new or worsening symptoms including fever, confusion, inability to void or any other concerns.   [LM]    Clinical Course User Index [LM] Tacy Learn, PA-C [SA] Harvie Heck, MD  Final Clinical Impressions(s) / ED Diagnoses   Final diagnoses:  None    ED Discharge Orders    None       Roque Lias 12/08/18 2319    Blanchie Dessert, MD 12/09/18 [phone]

## 2018-12-08 NOTE — ED Notes (Signed)
Patient states it burns when he voids

## 2018-12-08 NOTE — ED Triage Notes (Signed)
Patient arrived by [facility] with complaints of hematuria x 3 days. Has hx of UTI-patient alert and oriented and in no distress

## 2018-12-10 ENCOUNTER — Other Ambulatory Visit: Payer: Self-pay

## 2018-12-10 ENCOUNTER — Other Ambulatory Visit: Payer: Self-pay | Admitting: Family Medicine

## 2018-12-10 DIAGNOSIS — N390 Urinary tract infection, site not specified: Secondary | ICD-10-CM | POA: Diagnosis not present

## 2018-12-10 DIAGNOSIS — I69354 Hemiplegia and hemiparesis following cerebral infarction affecting left non-dominant side: Secondary | ICD-10-CM | POA: Diagnosis not present

## 2018-12-10 DIAGNOSIS — E1151 Type 2 diabetes mellitus with diabetic peripheral angiopathy without gangrene: Secondary | ICD-10-CM | POA: Diagnosis not present

## 2018-12-10 DIAGNOSIS — A419 Sepsis, unspecified organism: Secondary | ICD-10-CM | POA: Diagnosis not present

## 2018-12-10 DIAGNOSIS — I110 Hypertensive heart disease with heart failure: Secondary | ICD-10-CM | POA: Diagnosis not present

## 2018-12-10 DIAGNOSIS — I5032 Chronic diastolic (congestive) heart failure: Secondary | ICD-10-CM | POA: Diagnosis not present

## 2018-12-10 MED ORDER — CLOPIDOGREL BISULFATE 75 MG PO TABS: 75.0000 mg | ORAL_TABLET | Freq: Every day | ORAL | 3 refills | Status: DC

## 2018-12-10 MED ORDER — DIVALPROEX SODIUM ER 500 MG PO TB24: 500.0000 mg | ORAL_TABLET | Freq: Every day | ORAL | 3 refills | Status: DC

## 2018-12-11 DIAGNOSIS — E1151 Type 2 diabetes mellitus with diabetic peripheral angiopathy without gangrene: Secondary | ICD-10-CM | POA: Diagnosis not present

## 2018-12-11 DIAGNOSIS — I110 Hypertensive heart disease with heart failure: Secondary | ICD-10-CM | POA: Diagnosis not present

## 2018-12-11 DIAGNOSIS — I69354 Hemiplegia and hemiparesis following cerebral infarction affecting left non-dominant side: Secondary | ICD-10-CM | POA: Diagnosis not present

## 2018-12-11 DIAGNOSIS — I5032 Chronic diastolic (congestive) heart failure: Secondary | ICD-10-CM | POA: Diagnosis not present

## 2018-12-11 DIAGNOSIS — N390 Urinary tract infection, site not specified: Secondary | ICD-10-CM | POA: Diagnosis not present

## 2018-12-11 DIAGNOSIS — A419 Sepsis, unspecified organism: Secondary | ICD-10-CM | POA: Diagnosis not present

## 2018-12-11 LAB — URINE CULTURE: Culture: >=100000 — AB

## 2018-12-13 LAB — CULTURE, BLOOD (ROUTINE X 2)
Culture: NO GROWTH
Culture: NO GROWTH

## 2018-12-14 DIAGNOSIS — I5032 Chronic diastolic (congestive) heart failure: Secondary | ICD-10-CM | POA: Diagnosis not present

## 2018-12-14 DIAGNOSIS — E1151 Type 2 diabetes mellitus with diabetic peripheral angiopathy without gangrene: Secondary | ICD-10-CM | POA: Diagnosis not present

## 2018-12-14 DIAGNOSIS — I69354 Hemiplegia and hemiparesis following cerebral infarction affecting left non-dominant side: Secondary | ICD-10-CM | POA: Diagnosis not present

## 2018-12-14 DIAGNOSIS — N390 Urinary tract infection, site not specified: Secondary | ICD-10-CM | POA: Diagnosis not present

## 2018-12-14 DIAGNOSIS — A419 Sepsis, unspecified organism: Secondary | ICD-10-CM | POA: Diagnosis not present

## 2018-12-14 DIAGNOSIS — I110 Hypertensive heart disease with heart failure: Secondary | ICD-10-CM | POA: Diagnosis not present

## 2018-12-18 DIAGNOSIS — E1151 Type 2 diabetes mellitus with diabetic peripheral angiopathy without gangrene: Secondary | ICD-10-CM | POA: Diagnosis not present

## 2018-12-18 DIAGNOSIS — I110 Hypertensive heart disease with heart failure: Secondary | ICD-10-CM | POA: Diagnosis not present

## 2018-12-18 DIAGNOSIS — I5032 Chronic diastolic (congestive) heart failure: Secondary | ICD-10-CM | POA: Diagnosis not present

## 2018-12-18 DIAGNOSIS — N390 Urinary tract infection, site not specified: Secondary | ICD-10-CM | POA: Diagnosis not present

## 2018-12-18 DIAGNOSIS — I69354 Hemiplegia and hemiparesis following cerebral infarction affecting left non-dominant side: Secondary | ICD-10-CM | POA: Diagnosis not present

## 2018-12-18 DIAGNOSIS — A419 Sepsis, unspecified organism: Secondary | ICD-10-CM | POA: Diagnosis not present

## 2018-12-20 DIAGNOSIS — I69354 Hemiplegia and hemiparesis following cerebral infarction affecting left non-dominant side: Secondary | ICD-10-CM | POA: Diagnosis not present

## 2018-12-20 DIAGNOSIS — A419 Sepsis, unspecified organism: Secondary | ICD-10-CM | POA: Diagnosis not present

## 2018-12-20 DIAGNOSIS — N390 Urinary tract infection, site not specified: Secondary | ICD-10-CM | POA: Diagnosis not present

## 2018-12-20 DIAGNOSIS — I110 Hypertensive heart disease with heart failure: Secondary | ICD-10-CM | POA: Diagnosis not present

## 2018-12-20 DIAGNOSIS — I5032 Chronic diastolic (congestive) heart failure: Secondary | ICD-10-CM | POA: Diagnosis not present

## 2018-12-20 DIAGNOSIS — E1151 Type 2 diabetes mellitus with diabetic peripheral angiopathy without gangrene: Secondary | ICD-10-CM | POA: Diagnosis not present

## 2018-12-25 DIAGNOSIS — I69354 Hemiplegia and hemiparesis following cerebral infarction affecting left non-dominant side: Secondary | ICD-10-CM | POA: Diagnosis not present

## 2018-12-25 DIAGNOSIS — I110 Hypertensive heart disease with heart failure: Secondary | ICD-10-CM | POA: Diagnosis not present

## 2018-12-25 DIAGNOSIS — A419 Sepsis, unspecified organism: Secondary | ICD-10-CM | POA: Diagnosis not present

## 2018-12-25 DIAGNOSIS — E1151 Type 2 diabetes mellitus with diabetic peripheral angiopathy without gangrene: Secondary | ICD-10-CM | POA: Diagnosis not present

## 2018-12-25 DIAGNOSIS — N390 Urinary tract infection, site not specified: Secondary | ICD-10-CM | POA: Diagnosis not present

## 2018-12-25 DIAGNOSIS — I5032 Chronic diastolic (congestive) heart failure: Secondary | ICD-10-CM | POA: Diagnosis not present

## 2018-12-26 ENCOUNTER — Telehealth: Payer: Self-pay

## 2018-12-26 NOTE — Telephone Encounter (Signed)
Yes, okay to give verbal.  Okay to send in the corresponding sensors.  If he would recommend something besides the 14-day just let us know.  Not sure if that is the typical that is prescribed or not.

## 2018-12-26 NOTE — Telephone Encounter (Signed)
Pharmacist Marjory Lies from [facility] called wanting clarification on RX that was sent 11/12/18.  States that an RX was sent for the 14 day freestyle reader was sent in, but the corresponding sensors were not sent.   Marjory Lies wants to make sure that it is OK that patient received the 14 day sensors. IF this is what was desired, please advise. I can call and give verbal to Marjory Lies and also give refills, since patient only received #1 and will need to change it in 14 days.

## 2018-12-26 NOTE — Telephone Encounter (Signed)
Pharmacist advised

## 2018-12-28 DIAGNOSIS — K5730 Diverticulosis of large intestine without perforation or abscess without bleeding: Secondary | ICD-10-CM | POA: Diagnosis not present

## 2018-12-28 DIAGNOSIS — Z9089 Acquired absence of other organs: Secondary | ICD-10-CM | POA: Diagnosis not present

## 2018-12-28 DIAGNOSIS — Z86018 Personal history of other benign neoplasm: Secondary | ICD-10-CM | POA: Diagnosis not present

## 2018-12-28 DIAGNOSIS — I2510 Atherosclerotic heart disease of native coronary artery without angina pectoris: Secondary | ICD-10-CM | POA: Diagnosis not present

## 2018-12-28 DIAGNOSIS — E785 Hyperlipidemia, unspecified: Secondary | ICD-10-CM | POA: Diagnosis not present

## 2018-12-28 DIAGNOSIS — R000 Tachycardia, unspecified: Secondary | ICD-10-CM | POA: Diagnosis not present

## 2018-12-28 DIAGNOSIS — I69354 Hemiplegia and hemiparesis following cerebral infarction affecting left non-dominant side: Secondary | ICD-10-CM | POA: Diagnosis not present

## 2018-12-28 DIAGNOSIS — E1142 Type 2 diabetes mellitus with diabetic polyneuropathy: Secondary | ICD-10-CM | POA: Diagnosis not present

## 2018-12-28 DIAGNOSIS — I4891 Unspecified atrial fibrillation: Secondary | ICD-10-CM | POA: Diagnosis not present

## 2018-12-28 DIAGNOSIS — I110 Hypertensive heart disease with heart failure: Secondary | ICD-10-CM | POA: Diagnosis not present

## 2018-12-28 DIAGNOSIS — Z794 Long term (current) use of insulin: Secondary | ICD-10-CM | POA: Diagnosis not present

## 2018-12-28 DIAGNOSIS — A419 Sepsis, unspecified organism: Secondary | ICD-10-CM | POA: Diagnosis not present

## 2018-12-28 DIAGNOSIS — Z951 Presence of aortocoronary bypass graft: Secondary | ICD-10-CM | POA: Diagnosis not present

## 2018-12-28 DIAGNOSIS — N390 Urinary tract infection, site not specified: Secondary | ICD-10-CM | POA: Diagnosis not present

## 2018-12-28 DIAGNOSIS — E669 Obesity, unspecified: Secondary | ICD-10-CM | POA: Diagnosis not present

## 2018-12-28 DIAGNOSIS — Z9841 Cataract extraction status, right eye: Secondary | ICD-10-CM | POA: Diagnosis not present

## 2018-12-28 DIAGNOSIS — Z7902 Long term (current) use of antithrombotics/antiplatelets: Secondary | ICD-10-CM | POA: Diagnosis not present

## 2018-12-28 DIAGNOSIS — E1151 Type 2 diabetes mellitus with diabetic peripheral angiopathy without gangrene: Secondary | ICD-10-CM | POA: Diagnosis not present

## 2018-12-28 DIAGNOSIS — I5032 Chronic diastolic (congestive) heart failure: Secondary | ICD-10-CM | POA: Diagnosis not present

## 2018-12-28 DIAGNOSIS — M170 Bilateral primary osteoarthritis of knee: Secondary | ICD-10-CM | POA: Diagnosis not present

## 2018-12-28 DIAGNOSIS — M542 Cervicalgia: Secondary | ICD-10-CM | POA: Diagnosis not present

## 2018-12-28 DIAGNOSIS — J9811 Atelectasis: Secondary | ICD-10-CM | POA: Diagnosis not present

## 2018-12-28 DIAGNOSIS — D649 Anemia, unspecified: Secondary | ICD-10-CM | POA: Diagnosis not present

## 2018-12-28 DIAGNOSIS — G40209 Localization-related (focal) (partial) symptomatic epilepsy and epileptic syndromes with complex partial seizures, not intractable, without status epilepticus: Secondary | ICD-10-CM | POA: Diagnosis not present

## 2018-12-28 DIAGNOSIS — M25462 Effusion, left knee: Secondary | ICD-10-CM | POA: Diagnosis not present

## 2019-01-03 DIAGNOSIS — N390 Urinary tract infection, site not specified: Secondary | ICD-10-CM | POA: Diagnosis not present

## 2019-01-03 DIAGNOSIS — E1151 Type 2 diabetes mellitus with diabetic peripheral angiopathy without gangrene: Secondary | ICD-10-CM | POA: Diagnosis not present

## 2019-01-03 DIAGNOSIS — A419 Sepsis, unspecified organism: Secondary | ICD-10-CM | POA: Diagnosis not present

## 2019-01-03 DIAGNOSIS — I5032 Chronic diastolic (congestive) heart failure: Secondary | ICD-10-CM | POA: Diagnosis not present

## 2019-01-03 DIAGNOSIS — I110 Hypertensive heart disease with heart failure: Secondary | ICD-10-CM | POA: Diagnosis not present

## 2019-01-03 DIAGNOSIS — I69354 Hemiplegia and hemiparesis following cerebral infarction affecting left non-dominant side: Secondary | ICD-10-CM | POA: Diagnosis not present

## 2019-01-05 DIAGNOSIS — I110 Hypertensive heart disease with heart failure: Secondary | ICD-10-CM | POA: Diagnosis not present

## 2019-01-05 DIAGNOSIS — I5032 Chronic diastolic (congestive) heart failure: Secondary | ICD-10-CM | POA: Diagnosis not present

## 2019-01-05 DIAGNOSIS — I69354 Hemiplegia and hemiparesis following cerebral infarction affecting left non-dominant side: Secondary | ICD-10-CM | POA: Diagnosis not present

## 2019-01-05 DIAGNOSIS — E1151 Type 2 diabetes mellitus with diabetic peripheral angiopathy without gangrene: Secondary | ICD-10-CM | POA: Diagnosis not present

## 2019-01-05 DIAGNOSIS — N390 Urinary tract infection, site not specified: Secondary | ICD-10-CM | POA: Diagnosis not present

## 2019-01-05 DIAGNOSIS — A419 Sepsis, unspecified organism: Secondary | ICD-10-CM | POA: Diagnosis not present

## 2019-01-06 ENCOUNTER — Other Ambulatory Visit: Payer: Self-pay | Admitting: Family Medicine

## 2019-01-07 DIAGNOSIS — I69354 Hemiplegia and hemiparesis following cerebral infarction affecting left non-dominant side: Secondary | ICD-10-CM | POA: Diagnosis not present

## 2019-01-07 DIAGNOSIS — I5032 Chronic diastolic (congestive) heart failure: Secondary | ICD-10-CM | POA: Diagnosis not present

## 2019-01-07 DIAGNOSIS — N390 Urinary tract infection, site not specified: Secondary | ICD-10-CM | POA: Diagnosis not present

## 2019-01-07 DIAGNOSIS — E1151 Type 2 diabetes mellitus with diabetic peripheral angiopathy without gangrene: Secondary | ICD-10-CM | POA: Diagnosis not present

## 2019-01-07 DIAGNOSIS — A419 Sepsis, unspecified organism: Secondary | ICD-10-CM | POA: Diagnosis not present

## 2019-01-07 DIAGNOSIS — I110 Hypertensive heart disease with heart failure: Secondary | ICD-10-CM | POA: Diagnosis not present

## 2019-01-18 DIAGNOSIS — E1151 Type 2 diabetes mellitus with diabetic peripheral angiopathy without gangrene: Secondary | ICD-10-CM | POA: Diagnosis not present

## 2019-01-18 DIAGNOSIS — I110 Hypertensive heart disease with heart failure: Secondary | ICD-10-CM | POA: Diagnosis not present

## 2019-01-18 DIAGNOSIS — N390 Urinary tract infection, site not specified: Secondary | ICD-10-CM | POA: Diagnosis not present

## 2019-01-18 DIAGNOSIS — A419 Sepsis, unspecified organism: Secondary | ICD-10-CM | POA: Diagnosis not present

## 2019-01-18 DIAGNOSIS — I69354 Hemiplegia and hemiparesis following cerebral infarction affecting left non-dominant side: Secondary | ICD-10-CM | POA: Diagnosis not present

## 2019-01-18 DIAGNOSIS — I5032 Chronic diastolic (congestive) heart failure: Secondary | ICD-10-CM | POA: Diagnosis not present

## 2019-01-25 DIAGNOSIS — I110 Hypertensive heart disease with heart failure: Secondary | ICD-10-CM | POA: Diagnosis not present

## 2019-01-25 DIAGNOSIS — I69354 Hemiplegia and hemiparesis following cerebral infarction affecting left non-dominant side: Secondary | ICD-10-CM | POA: Diagnosis not present

## 2019-01-25 DIAGNOSIS — E1151 Type 2 diabetes mellitus with diabetic peripheral angiopathy without gangrene: Secondary | ICD-10-CM | POA: Diagnosis not present

## 2019-01-25 DIAGNOSIS — I5032 Chronic diastolic (congestive) heart failure: Secondary | ICD-10-CM | POA: Diagnosis not present

## 2019-01-25 DIAGNOSIS — A419 Sepsis, unspecified organism: Secondary | ICD-10-CM | POA: Diagnosis not present

## 2019-01-25 DIAGNOSIS — N390 Urinary tract infection, site not specified: Secondary | ICD-10-CM | POA: Diagnosis not present

## 2019-02-18 ENCOUNTER — Other Ambulatory Visit: Payer: Self-pay | Admitting: *Deleted

## 2019-02-18 MED ORDER — METFORMIN HCL 500 MG PO TABS: ORAL_TABLET | ORAL | 1 refills | Status: DC

## 2019-03-05 ENCOUNTER — Ambulatory Visit: Payer: Medicare Other | Admitting: Family Medicine

## 2019-03-19 ENCOUNTER — Telehealth (INDEPENDENT_AMBULATORY_CARE_PROVIDER_SITE_OTHER): Payer: Medicare Other | Admitting: Neurology

## 2019-03-19 ENCOUNTER — Encounter: Payer: Self-pay | Admitting: Neurology

## 2019-03-19 ENCOUNTER — Other Ambulatory Visit: Payer: Self-pay

## 2019-03-19 VITALS — Ht 63.0 in | Wt 166.0 lb

## 2019-03-19 DIAGNOSIS — G40009 Localization-related (focal) (partial) idiopathic epilepsy and epileptic syndromes with seizures of localized onset, not intractable, without status epilepticus: Secondary | ICD-10-CM

## 2019-03-19 DIAGNOSIS — I639 Cerebral infarction, unspecified: Secondary | ICD-10-CM

## 2019-03-19 DIAGNOSIS — I10 Essential (primary) hypertension: Secondary | ICD-10-CM | POA: Diagnosis not present

## 2019-03-19 DIAGNOSIS — R569 Unspecified convulsions: Secondary | ICD-10-CM

## 2019-03-19 NOTE — Progress Notes (Signed)
Virtual Visit via Video Note The purpose of this virtual visit is to provide medical care while limiting exposure to the novel coronavirus.    Consent was obtained for video visit:  Yes.   Answered questions that patient had about telehealth interaction:  Yes.   I discussed the limitations, risks, security and privacy concerns of performing an evaluation and management service by telemedicine. I also discussed with the patient that there may be a patient responsible charge related to this service. The patient expressed understanding and agreed to proceed.  Pt location: Home Physician Location: office Name of referring provider:  [name], * I connected with Fred [name] at patients initiation/request on 03/19/2019 at  4:00 PM EST by video enabled telemedicine application and verified that I am speaking with the correct person using two identifiers. Pt MRN:  [id number] Pt DOB:  1935/04/16 Video Participants:  Fred [name];  [name] (daughter)   History of Present Illness:  The patient was seen as a virtual video visit on 03/19/2019. He was last seen 7 months ago in the neurology clinic for recurrent strokes with subsequent seizures. He and his daughter deny any seizures where he was having episodes of confusion/gibberish speech since January 2019. He is on Depakote ER '500mg'$  qhs without side effects. He has mild memory issues when he has a UTI, overall doing well. He manages his own medications. He does not drive. He has left hemiparesis and mostly uses a wheelchair for mobility. He denies any headaches, dizziness, vision changes, no falls. Sleep is great.    History on Initial Assessment 06/07/2017: This is an 84 year old right-handed man with a history of hypertension, diabetes, CAD s/p CABG, post-operative atrial fibrillation, PVD, prior stroke with subsequent seizures, presenting to establish care. He had previously been seeing neurologist Dr. Erlinda Hong, records reviewed. He  had a stroke in June 2016 with left-sided weakness and confusion. He did not receive IV-TPA due to rapidly resolving deficits. His MRI brain showed an acute small infarct in the right posterior frontal lobe and remote small thalamic infarcts. Stroke workup was unremarkable except for elevated HbA1c of 7.3 and EF 45-40%. Due to questionable seizure, he was discharged home on Depakote ER '500mg'$  daily. There is note of a confusional episode when he had a CABG done in 2015 with post-procedure atrial fibrillation, he had an EEG, results unavailable. On his follow-up with Dr. Erlinda Hong in August 2016, they asked about continuing Depakote for seizure and it was discontinued. He was off Depakote on follow-up in November 2017. On his last visit with Dr. Erlinda Hong in July 2018, it was noted that he was put back on Depakote by his PCP for seizure prevention in May 2018 (refills were sent and a referral to Neurology was done at that time). On 03/21/2017 he had an episode of garbled speech and confusion. They could not make out what he was saying, it was all gibberish. He was getting frustrated because they could not understand him. He was holding a can of soda in his hand and clenched on it so hard the soda spilled. He started making words in 3-4 minutes. When EMS arrived, speech was still slurred, he was still confused and agreed to go to the hospital. He was brought to [facility] where he was noted to be aphasic with probable right-sided neglect and homonymous hemianopia. Family reports he said he was 84 years old and did not know the president. Head CT and CTA were negative, due  to concern for stroke he was given IV-TPA within the 4-1/2 hour window. His daughter reports that within 15-20 minutes after TPA, they saw a dramatic change. His MRI brain did not show any infarct. It was noted he had a stroke-like episode treated with IV-TPA with full recovery, possible complex partial seizure. He had an EEG which was mildly abnormal due to mild  background slowing. It is unclear when Depakote was stopped, but family reports it was restarted at that time (Depakote level on admission was <10). He was discharged home on Depakote ER '500mg'$  qhs and has overall been doing better.  He does not recall the hospitalization in January 2019 and denies having any difficulties. His daughter also reports an episode in 2017 where he came out of the car looking straight ahead, stammering, mumbling but not saying real words. Then he just came out of it and was fine. They brought him to the hospital (no records on Epic) and was told it was probably a TIA and seizure. His daughter denies any other staring/unresponsive episodes. He denies any olfactory/gustatory hallucinations, deja vu, rising epigastric sensation, focal numbness/tingling/weakness, myoclonic jerks. He denies any headaches, dizziness, diplopia, dysarthria/dysphagia, neck/back pain, bowel/bladder dysfunction. Sleep is good. He has some knee pain and leg cramps.   Update 12/13/2017: Since his last visit, his daughter denies any further confusion episodes or gibberish speech, however he had another stroke last 08/09/17. Records were reviewed. He started having left-sided weakness and was brought to the ER, initially with 4+/5 strength. NIH of 1. Over the course of his stay, he had progressive worsening left hemiparesis and slurred speech. MRI brain showed right posterior limb of internal capsule stroke. 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 CTA head and neck showed <50 right ICA stenosis, 70% left ICA stenosis, LDL 130, HbA1c 6.0, normal echocardiogram. He had been taking aspirin '325mg'$  daily prior to this stroke, he was discharged home on Plavix and aspirin to take for 3 weeks, then has been back to aspirin alone. There has been some improvement in left sided weakness, he can now push with his left leg and slightly lift up his left arm.  No numbness/tingling. He manages his own medications. He will be starting outpatient PT/OT tomorrow and looks forward to it.   Epilepsy Risk Factors:  His father had seizures. Prior stroke. Otherwise he had a normal birth and early development.  There is no history of febrile convulsions, CNS infections such as meningitis/encephalitis, significant traumatic brain injury, neurosurgical procedures.     Current Outpatient Medications on File Prior to Visit  Medication Sig Dispense Refill  . acetaminophen (TYLENOL) 325 MG tablet Take 2 tablets (650 mg total) by mouth every 4 (four) hours as needed for mild pain (or temp > 37.5 C (99.5 F)).    Marland Kitchen allopurinol (ZYLOPRIM) 300 MG tablet TAKE 1 TABLET BY MOUTH DAILY 90 tablet 1  . atorvastatin (LIPITOR) 80 MG tablet TAKE 1 TABLET BY MOUTH DAILY 90 tablet 3  . blood glucose meter kit and supplies Check blood sugar 3 times daily. 1 each 0  . clopidogrel (PLAVIX) 75 MG tablet Take 1 tablet (75 mg total) by mouth daily. 90 tablet 3  . Coenzyme Q10 (COQ10) 100 MG CAPS Take 300 mg by mouth daily.     . divalproex (DEPAKOTE ER) 500 MG 24 hr tablet Take 1 tablet (500 mg total) by mouth daily. 90 tablet 3  . ezetimibe (ZETIA)  10 MG tablet Take 1 tablet (10 mg total) by mouth daily. 90 tablet 3  . furosemide (LASIX) 20 MG tablet Take 1 tablet (20 mg total) by mouth daily. (Patient taking differently: Take 20 mg by mouth 3 (three) times a week. ) 90 tablet 1  . Insulin Glargine, 1 Unit Dial, (TOUJEO SOLOSTAR) 300 UNIT/ML SOPN Inject 10-20 Units into the skin at bedtime. 2 pen 5  . Insulin Pen Needle (NOVOFINE) 32G X 6 MM MISC Inject insulin daily 100 each prn  . Lancets (ONETOUCH DELICA PLUS [id number]) MISC     . losartan (COZAAR) 25 MG tablet TAKE 1 TABLET BY MOUTH DAILY (Patient taking differently: Take 25 mg by mouth daily. ) 90 tablet 1  . metFORMIN (GLUCOPHAGE) 500 MG tablet TAKE 1 TABLET BY MOUTH TWICE DAILY WITH A MEAL 180 tablet 1  . metoprolol succinate  (TOPROL-XL) 50 MG 24 hr tablet TAKE 1 TABLET BY MOUTH DAILY IMMEDIATELY FOLLOWING A MEAL (Patient taking differently: Take 50 mg by mouth daily. ) 90 tablet 1  . niacin (NIASPAN) 500 MG CR tablet Take 1 tablet (500 mg total) by mouth at bedtime. 90 tablet 3  . ONE TOUCH ULTRA TEST test strip     . senna-docusate (SENOKOT-S) 8.6-50 MG tablet Take 1 tablet by mouth 2 (two) times daily. (Patient taking differently: Take 1 tablet by mouth as needed for mild constipation. )    . TRADJENTA 5 MG TABS tablet TAKE 1 TABLET BY MOUTH DAILY (Patient taking differently: Take 5 mg by mouth daily. ) 90 tablet 1   No current facility-administered medications on file prior to visit.     Observations/Objective:   Vitals:   03/19/19 1522  Weight: 166 lb (75.3 kg)  Height: '5\' 3"'$  (1.6 m)   GEN:  The patient appears stated age and is in NAD, sitting on wheelchair.   Neurological exam: Patient is awake, alert, oriented x 3. No aphasia or dysarthria. Intact fluency and comprehension. Remote and recent memory intact. Able to name and repeat. Cranial nerves: Extraocular movements intact with no nystagmus. No facial asymmetry. Motor: anti-gravity on right UE and LE, antigravity with weakness left UE and LE, able to lift index finger on left hand and left knee extension antigravity. No incoordination on finger to nose testing on right. Gait not tested   IMPRESSION: This is an 84 yo RH man with a history of hypertension, diabetes, CAD s/p CABG, post-operative atrial fibrillation, PVD, seizures, and recurrent strokes. He has been seizure-free since January 2019 on Depakote ER '500mg'$  qhs. He continues with home exercises for left-sided weakness, continue Plavix and control of vascular risk factors. They know to go to the ER immediately for any sudden change in symptoms. Follow-up in 6 months, they know to call for any changes.   Follow Up Instructions:   -I discussed the assessment and treatment plan with the patient.  The patient was provided an opportunity to ask questions and all were answered. The patient agreed with the plan and demonstrated an understanding of the instructions.   The patient was advised to call back or seek an in-person evaluation if the symptoms worsen or if the condition fails to improve as anticipated.    Cameron Sprang, MD

## 2019-03-20 ENCOUNTER — Ambulatory Visit: Payer: Medicare Other | Admitting: Neurology

## 2019-03-20 MED ORDER — CLOPIDOGREL BISULFATE 75 MG PO TABS: 75.0000 mg | ORAL_TABLET | Freq: Every day | ORAL | 3 refills | Status: DC

## 2019-03-20 MED ORDER — DIVALPROEX SODIUM ER 500 MG PO TB24: 500.0000 mg | ORAL_TABLET | Freq: Every day | ORAL | 3 refills | Status: DC

## 2019-03-26 ENCOUNTER — Encounter: Payer: Self-pay | Admitting: Family Medicine

## 2019-03-26 ENCOUNTER — Ambulatory Visit (INDEPENDENT_AMBULATORY_CARE_PROVIDER_SITE_OTHER): Payer: Medicare Other | Admitting: Family Medicine

## 2019-03-26 ENCOUNTER — Other Ambulatory Visit: Payer: Self-pay

## 2019-03-26 VITALS — BP 128/52 | HR 66 | Ht 63.0 in | Wt 161.0 lb

## 2019-03-26 DIAGNOSIS — I10 Essential (primary) hypertension: Secondary | ICD-10-CM | POA: Diagnosis not present

## 2019-03-26 DIAGNOSIS — Z794 Long term (current) use of insulin: Secondary | ICD-10-CM | POA: Diagnosis not present

## 2019-03-26 DIAGNOSIS — E118 Type 2 diabetes mellitus with unspecified complications: Secondary | ICD-10-CM | POA: Diagnosis not present

## 2019-03-26 DIAGNOSIS — I5032 Chronic diastolic (congestive) heart failure: Secondary | ICD-10-CM | POA: Diagnosis not present

## 2019-03-26 NOTE — Assessment & Plan Note (Addendum)
No recent chest pain ,shortness breath, or swelling.  No sign of volume overload on exam today.

## 2019-03-26 NOTE — Assessment & Plan Note (Signed)
A1C great on inc dose of insulin. He is doing well.  F/U in 4 months.

## 2019-03-26 NOTE — Assessment & Plan Note (Signed)
Well controlled. Continue current regimen. Follow up in  4 mo 

## 2019-03-26 NOTE — Progress Notes (Signed)
Established Patient Office Visit  Subjective:  Patient ID: Fred [name], male    DOB: 16-Nov-1935  Age: 84 y.o. MRN: [id number]  CC:  Chief Complaint  Patient presents with  . Diabetes  . Hypertension    HPI Fred [name] presents for   Diabetes - no hypoglycemic events. No wounds or sores that are not healing well. No increased thirst or urination. Checking glucose at home. Taking medications as prescribed without any side effects.  Hypertension- Pt denies chest pain, SOB, dizziness, or heart palpitations.  Taking meds as directed w/o problems.  Denies medication side effects.  No lower extremity swelling.  Follow-up heart failure-no recent chest pain, shortness of breath or lower extremity swelling.  Did have some questions today about the Covid vaccine.  Past Medical History:  Diagnosis Date  . Atrial fibrillation ([place])    Post operative  . CAD (coronary artery disease)    s/p cabg  . CHF (congestive heart failure) ([place])   . Diverticulosis   . DJD (degenerative joint disease)   . DM (diabetes mellitus) ([place])   . History of anemia   . History of stroke   . Hyperlipidemia   . Hypertension   . 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 Peripheral vascular disease ([place])   . Polyneuropathy, diabetic ([place])   . Seizure disorder, complex partial ([place])   . Syncope and collapse     Past Surgical History:  Procedure Laterality Date  . CATARACT EXTRACTION W/ INTRAOCULAR LENS  IMPLANT, BILATERAL  06/2013   Dr. [name]  . CORONARY ARTERY BYPASS GRAFT  02/13/2004   4 vesel  . removal of sebaceous cyst from neck  08/2007   Dr. [name]  . TONSILLECTOMY      Family History  Problem Relation Age of Onset  . Epilepsy Father   . Stroke Mother   . Lung cancer Other     Social History   Socioeconomic History  . Marital status: Married    Spouse name: [name]  . Number of children: 3  . Years of education: Not on file  . Highest  education level: Not on file  Occupational History  . 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 Smoking status: Former Smoker    Packs/day: 1.00    Years: 22.00    Pack years: 22.00    Types: Cigarettes    Quit date: 03/14/1977    Years since quitting: 42.0  . Smokeless tobacco: Never Used  Substance and Sexual Activity  . Alcohol use: Not Currently    Comment: Pt has not had alcohol since 03/2017 CVA  . Drug use: No  . Sexual activity: Never  Other Topics Concern  . Not on file  Social History Narrative   3 caffeinated drinks per day. Mostly coffee. He walks 15 minutes 3 days per week. Quit smoking in 1978.      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    Social Determinants of Health   Financial Resource Strain:   . Difficulty of Paying Living Expenses: Not on file  Food Insecurity:   . Worried About Charity fundraiser in the Last Year: Not on file  . Ran Out of Food in the Last Year: Not on file  Transportation Needs:   .  Lack of Transportation (Medical): Not on file  . Lack of Transportation (Non-Medical): Not on file  Physical Activity:   . Days of Exercise per Week: Not on file  . Minutes of Exercise per Session: Not on file  Stress:   . Feeling of Stress : Not on file  Social Connections:   . Frequency of Communication with Friends and Family: Not on file  . Frequency of Social Gatherings with Friends and Family: Not on file  . Attends Religious Services: Not on file  . Active Member of Clubs or Organizations: Not on file  . Attends Archivist Meetings: Not on file  . Marital Status: Not on file  Intimate Partner Violence:   . Fear of Current or Ex-Partner: Not on file  . Emotionally Abused: Not on file  . Physically Abused: Not on file  . Sexually Abused: Not on file    Outpatient Medications Prior to Visit   Medication Sig Dispense Refill  . acetaminophen (TYLENOL) 325 MG tablet Take 2 tablets (650 mg total) by mouth every 4 (four) hours as needed for mild pain (or temp > 37.5 C (99.5 F)).    Marland Kitchen allopurinol (ZYLOPRIM) 300 MG tablet TAKE 1 TABLET BY MOUTH DAILY 90 tablet 1  . atorvastatin (LIPITOR) 80 MG tablet TAKE 1 TABLET BY MOUTH DAILY 90 tablet 3  . blood glucose meter kit and supplies Check blood sugar 3 times daily. 1 each 0  . clopidogrel (PLAVIX) 75 MG tablet Take 1 tablet (75 mg total) by mouth daily. 90 tablet 3  . Coenzyme Q10 (COQ10) 100 MG CAPS Take 300 mg by mouth daily.     . divalproex (DEPAKOTE ER) 500 MG 24 hr tablet Take 1 tablet (500 mg total) by mouth daily. 90 tablet 3  . ezetimibe (ZETIA) 10 MG tablet Take 1 tablet (10 mg total) by mouth daily. 90 tablet 3  . furosemide (LASIX) 20 MG tablet Take 1 tablet (20 mg total) by mouth daily. (Patient taking differently: Take 20 mg by mouth 3 (three) times a week. ) 90 tablet 1  . Insulin Glargine, 1 Unit Dial, (TOUJEO SOLOSTAR) 300 UNIT/ML SOPN Inject 10-20 Units into the skin at bedtime. 2 pen 5  . Insulin Pen Needle (NOVOFINE) 32G X 6 MM MISC Inject insulin daily 100 each prn  . Lancets (ONETOUCH DELICA PLUS [id number]) MISC     . losartan (COZAAR) 25 MG tablet TAKE 1 TABLET BY MOUTH DAILY (Patient taking differently: Take 25 mg by mouth daily. ) 90 tablet 1  . metFORMIN (GLUCOPHAGE) 500 MG tablet TAKE 1 TABLET BY MOUTH TWICE DAILY WITH A MEAL 180 tablet 1  . metoprolol succinate (TOPROL-XL) 50 MG 24 hr tablet TAKE 1 TABLET BY MOUTH DAILY IMMEDIATELY FOLLOWING A MEAL (Patient taking differently: Take 50 mg by mouth daily. ) 90 tablet 1  . niacin (NIASPAN) 500 MG CR tablet Take 1 tablet (500 mg total) by mouth at bedtime. 90 tablet 3  . ONE TOUCH ULTRA TEST test strip     . senna-docusate (SENOKOT-S) 8.6-50 MG tablet Take 1 tablet by mouth 2 (two) times daily. (Patient taking differently: Take 1 tablet by mouth as needed for mild  constipation. )    . TRADJENTA 5 MG TABS tablet TAKE 1 TABLET BY MOUTH DAILY (Patient taking differently: Take 5 mg by mouth daily. ) 90 tablet 1   No facility-administered medications prior to visit.    Allergies  Allergen Reactions  . Lisinopril Cough  ROS Review of Systems    Objective:    Physical Exam  Constitutional: He is oriented to person, place, and time. He appears well-developed and well-nourished.  HENT:  Head: Normocephalic and atraumatic.  Cardiovascular: Normal rate, regular rhythm and normal heart sounds.  Pulmonary/Chest: Effort normal and breath sounds normal.  Neurological: He is alert and oriented to person, place, and time.  Skin: Skin is warm and dry.  Psychiatric: He has a normal mood and affect. His behavior is normal.    BP (!) 128/52   Pulse 66   Ht '5\' 3"'$  (1.6 m)   Wt 161 lb (73 kg)   SpO2 99%   BMI 28.52 kg/m  Wt Readings from Last 3 Encounters:  03/26/19 161 lb (73 kg)  03/19/19 166 lb (75.3 kg)  12/08/18 170 lb (77.1 kg)     There are no preventive care reminders to display for this patient.  There are no preventive care reminders to display for this patient.  Lab Results  Component Value Date   TSH 0.99 03/28/2018   Lab Results  Component Value Date   WBC 11.4 (H) 12/08/2018   HGB 12.7 (L) 12/08/2018   HCT 38.7 (L) 12/08/2018   MCV 97.7 12/08/2018   PLT 381 12/08/2018   Lab Results  Component Value Date   NA 141 12/08/2018   K 4.2 12/08/2018   CO2 26 12/08/2018   GLUCOSE 131 (H) 12/08/2018   BUN 8 12/08/2018   CREATININE 0.87 12/08/2018   BILITOT 1.2 11/24/2018   ALKPHOS 60 11/24/2018   AST 15 11/24/2018   ALT 18 11/24/2018   PROT 6.4 (L) 11/24/2018   ALBUMIN 3.3 (L) 11/24/2018   CALCIUM 9.1 12/08/2018   ANIONGAP 11 12/08/2018   GFR 84.99 05/16/2013   Lab Results  Component Value Date   CHOL 195 08/10/2017   Lab Results  Component Value Date   HDL 40 (L) 08/10/2017   Lab Results  Component Value Date    LDLCALC 130 (H) 08/10/2017   Lab Results  Component Value Date   TRIG 125 08/10/2017   Lab Results  Component Value Date   CHOLHDL 4.9 08/10/2017   Lab Results  Component Value Date   HGBA1C 6.4 (A) 03/27/2019      Assessment & Plan:   Problem List Items Addressed This Visit      Cardiovascular and Mediastinum   Essential hypertension    Well controlled. Continue current regimen. Follow up in  4 mo      Chronic diastolic (congestive) heart failure (HCC)    No recent chest pain ,shortness breath, or swelling.  No sign of volume overload on exam today.        Endocrine   Type 2 diabetes mellitus with complication, with long-term current use of insulin (HCC) - Primary    A1C great on inc dose of insulin. He is doing well.  F/U in 4 months.        Relevant Orders   POCT HgB A1C (Completed)     Answered questions about the Covid vaccine and where to get this.  Unfortunately we will not be providing it from our office.  No orders of the defined types were placed in this encounter.   Follow-up: Return in about 4 months (around 07/24/2019) for Diabetes follow-up.    Beatrice Lecher, MD

## 2019-03-27 LAB — POCT GLYCOSYLATED HEMOGLOBIN (HGB A1C): Hemoglobin A1C: 6.4 % — AB (ref 4.0–5.6)

## 2019-05-13 ENCOUNTER — Ambulatory Visit: Payer: Medicare Other | Admitting: Family Medicine

## 2019-05-25 ENCOUNTER — Other Ambulatory Visit: Payer: Self-pay | Admitting: Family Medicine

## 2019-07-25 ENCOUNTER — Ambulatory Visit: Payer: Medicare Other | Admitting: Family Medicine

## 2019-07-30 ENCOUNTER — Other Ambulatory Visit: Payer: Self-pay

## 2019-07-30 ENCOUNTER — Telehealth (INDEPENDENT_AMBULATORY_CARE_PROVIDER_SITE_OTHER): Payer: Medicare Other | Admitting: Family Medicine

## 2019-07-30 ENCOUNTER — Encounter: Payer: Self-pay | Admitting: Family Medicine

## 2019-07-30 DIAGNOSIS — H6123 Impacted cerumen, bilateral: Secondary | ICD-10-CM | POA: Diagnosis not present

## 2019-07-30 DIAGNOSIS — Z20828 Contact with and (suspected) exposure to other viral communicable diseases: Secondary | ICD-10-CM | POA: Diagnosis not present

## 2019-07-30 DIAGNOSIS — Z03818 Encounter for observation for suspected exposure to other biological agents ruled out: Secondary | ICD-10-CM | POA: Diagnosis not present

## 2019-07-30 DIAGNOSIS — R197 Diarrhea, unspecified: Secondary | ICD-10-CM

## 2019-07-30 DIAGNOSIS — Z20822 Contact with and (suspected) exposure to covid-19: Secondary | ICD-10-CM | POA: Diagnosis not present

## 2019-07-30 DIAGNOSIS — R112 Nausea with vomiting, unspecified: Secondary | ICD-10-CM

## 2019-07-30 DIAGNOSIS — J029 Acute pharyngitis, unspecified: Secondary | ICD-10-CM | POA: Diagnosis not present

## 2019-07-30 NOTE — Progress Notes (Signed)
Virtual Visit via Video Note  I connected with Fred [name] on 07/30/19 at  4:20 PM EDT by a video enabled telemedicine application and verified that I am speaking with the correct person using two identifiers.   I discussed the limitations of evaluation and management by telemedicine and the availability of in person appointments. The patient expressed understanding and agreed to proceed.  Subjective:    CC: vomited x 1 about 3 days ago. Marland Kitchen    HPI:  Vomited x 1 on Saturday.  Has been nauseated and dec appetite since then  Holding insulin the last couple of nights.  He also had diarrhea that day several times.  Denies any recent change or increase in reflux symptoms.  He has been a little bit nauseated since then.  He did take Imodium when he had the diarrhea and has not had a bowel movement in the last 2 days.  No CP or SOB.  No URI sxs or facial pressure or sinus congestion.  No HA.  No increase in lower extremity swelling he has some chronic left foot swelling.  Took him to be tested for strep throat and Covid.  He had rapid testing which was both negative.   Daughter Fred [name] reports that he is actually been having some episodes of diarrhea on and off before this occurred.  Past medical history, Surgical history, Family history not pertinant except as noted below, Social history, Allergies, and medications have been entered into the medical record, reviewed, and corrections made.   Review of Systems: No fevers, chills, night sweats, weight loss, chest pain, or shortness of breath.   Objective:    General: Speaking clearly in complete sentences without any shortness of breath.  Alert and oriented x3.  Normal judgment. No apparent acute distress.    Impression and Recommendations:    No problem-specific Assessment & Plan notes found for this encounter.  Vomiting with diarrhea-suspect that he had some type of acute episode of viral gastroenteritis though no other known sick  contacts he did tested negative for Covid and for strep throat today.  They report that his blood pressure was checked as well as his chest was examined and was told that it was normal.  Diarrhea-on and off-we will go ahead and hold metformin for the next couple of weeks.  His daughter will remove it from the pillbox.  Also told that he had cerumen impactions bilaterally so she did pick up some Debrox drops and will start those.    Time spent in encounter 22 minutes  I discussed the assessment and treatment plan with the patient. The patient was provided an opportunity to ask questions and all were answered. The patient agreed with the plan and demonstrated an understanding of the instructions.   The patient was advised to call back or seek an in-person evaluation if the symptoms worsen or if the condition fails to improve as anticipated.   Beatrice Lecher, MD

## 2019-07-30 NOTE — Progress Notes (Signed)
[name] stated that he

## 2019-07-31 ENCOUNTER — Telehealth: Payer: Medicare Other | Admitting: Family Medicine

## 2019-08-01 ENCOUNTER — Ambulatory Visit (INDEPENDENT_AMBULATORY_CARE_PROVIDER_SITE_OTHER): Payer: Medicare Other

## 2019-08-01 ENCOUNTER — Other Ambulatory Visit: Payer: Self-pay

## 2019-08-01 ENCOUNTER — Telehealth: Payer: Medicare Other | Admitting: Family Medicine

## 2019-08-01 ENCOUNTER — Ambulatory Visit (INDEPENDENT_AMBULATORY_CARE_PROVIDER_SITE_OTHER): Payer: Medicare Other | Admitting: Family Medicine

## 2019-08-01 ENCOUNTER — Encounter: Payer: Self-pay | Admitting: Family Medicine

## 2019-08-01 VITALS — BP 142/66 | HR 98

## 2019-08-01 DIAGNOSIS — R5383 Other fatigue: Secondary | ICD-10-CM | POA: Diagnosis not present

## 2019-08-01 DIAGNOSIS — E118 Type 2 diabetes mellitus with unspecified complications: Secondary | ICD-10-CM

## 2019-08-01 DIAGNOSIS — R152 Fecal urgency: Secondary | ICD-10-CM

## 2019-08-01 DIAGNOSIS — Z794 Long term (current) use of insulin: Secondary | ICD-10-CM | POA: Diagnosis not present

## 2019-08-01 DIAGNOSIS — R05 Cough: Secondary | ICD-10-CM

## 2019-08-01 DIAGNOSIS — N4883 Acquired buried penis: Secondary | ICD-10-CM | POA: Diagnosis not present

## 2019-08-01 DIAGNOSIS — R1312 Dysphagia, oropharyngeal phase: Secondary | ICD-10-CM

## 2019-08-01 DIAGNOSIS — R059 Cough, unspecified: Secondary | ICD-10-CM

## 2019-08-01 DIAGNOSIS — N3945 Continuous leakage: Secondary | ICD-10-CM | POA: Diagnosis not present

## 2019-08-01 LAB — POCT GLYCOSYLATED HEMOGLOBIN (HGB A1C): Hemoglobin A1C: 6.9 % — AB (ref 4.0–5.6)

## 2019-08-01 NOTE — Progress Notes (Addendum)
Established Patient Office Visit  Subjective:  Patient ID: Fred [name], male    DOB: 28-Jul-1935  Age: 84 y.o. MRN: [id number]  CC:  Chief Complaint  Patient presents with  . Follow-up    HPI ATA PECHA presents for follow-up.  Please see prior note for virtual visit.  His daughter brought him in today because yesterday he actually seemed a little worse he was really most lethargic they felt like at some point he was slurring his speech.  He has still not run a fever he did have some chills.  Again was tested for strep throat and Covid and was negative.  His daughter noticed that he has had some cough but it is not necessarily getting worse.  They have noticed though that he seems to be getting choked with his food it is happening almost every time he eats snouts been going on for about 2 months more consistently.  He also has come to concerns with stool urgency that has been going on for quite some time as well as not necessarily new. .  Urinary incontinence-he is currently using condom catheters.  Unfortunately it is not staying on at night it comes off easily and he is leaking all over the bed.  Past Medical History:  Diagnosis Date  . Atrial fibrillation ([place])    Post operative  . CAD (coronary artery disease)    s/p cabg  . CHF (congestive heart failure) ([place])   . Diverticulosis   . DJD (degenerative joint disease)   . DM (diabetes mellitus) ([place])   . History of anemia   . History of stroke   . Hyperlipidemia   . Hypertension   . 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 Peripheral vascular disease ([place])   . Polyneuropathy, diabetic ([place])   . Seizure disorder, complex partial ([place])   . Syncope and collapse     Past Surgical History:  Procedure Laterality Date  . CATARACT EXTRACTION W/ INTRAOCULAR LENS  IMPLANT, BILATERAL  06/2013   Dr. [name]  . CORONARY ARTERY BYPASS GRAFT  02/13/2004   4 vesel  . removal of sebaceous cyst from neck   08/2007   Dr. [name]  . TONSILLECTOMY      Family History  Problem Relation Age of Onset  . Epilepsy Father   . Stroke Mother   . Lung cancer Other     Social History   Socioeconomic History  . Marital status: Married    Spouse name: Wenceslaus Gist  . Number of children: 3  . Years of education: Not on file  . Highest education level: Not on file  Occupational History  . 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 Smoking status: Former Smoker    Packs/day: 1.00    Years: 22.00    Pack years: 22.00    Types: Cigarettes    Quit date: 03/14/1977    Years since quitting: 42.6  . Smokeless tobacco: Never Used  Vaping Use  . Vaping Use: Never used  Substance and Sexual Activity  . Alcohol use: Not Currently    Comment: Pt has not had alcohol since 03/2017 CVA  . Drug use: No  . Sexual activity: Never  Other Topics Concern  . Not on file  Social History Narrative   3 caffeinated drinks per day. Mostly coffee. He walks 15 minutes 3 days per week. 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    Social Determinants of Health   Financial Resource Strain:   . Difficulty of Paying Living Expenses:   Food Insecurity:   . Worried About Charity fundraiser in the Last Year:   . Arboriculturist in the Last Year:   Transportation Needs:   . Film/video editor (Medical):   Marland Kitchen Lack of Transportation (Non-Medical):   Physical Activity:   . Days of Exercise per Week:   . Minutes of Exercise per Session:   Stress:   . Feeling of Stress :   Social Connections:   . Frequency of Communication with Friends and Family:   . Frequency of Social Gatherings with Friends and Family:   . Attends Religious Services:   . Active Member of Clubs or Organizations:   . Attends Archivist Meetings:   Marland Kitchen Marital Status:    Intimate Partner Violence:   . Fear of Current or Ex-Partner:   . Emotionally Abused:   Marland Kitchen Physically Abused:   . Sexually Abused:     Outpatient Medications Prior to Visit  Medication Sig Dispense Refill  . acetaminophen (TYLENOL) 325 MG tablet Take 2 tablets (650 mg total) by mouth every 4 (four) hours as needed for mild pain (or temp > 37.5 C (99.5 F)).    Marland Kitchen atorvastatin (LIPITOR) 80 MG tablet TAKE 1 TABLET BY MOUTH DAILY 90 tablet 3  . blood glucose meter kit and supplies Check blood sugar 3 times daily. 1 each 0  . Cholecalciferol (VITAMIN D) 50 MCG (2000 UT) tablet Take 1 tablet by mouth daily.    . clopidogrel (PLAVIX) 75 MG tablet Take 1 tablet (75 mg total) by mouth daily. 90 tablet 3  . Coenzyme Q10 (COQ10) 100 MG CAPS Take 300 mg by mouth daily.     . divalproex (DEPAKOTE ER) 500 MG 24 hr tablet Take 1 tablet (500 mg total) by mouth daily. 90 tablet 3  . ezetimibe (ZETIA) 10 MG tablet Take 1 tablet (10 mg total) by mouth daily. 90 tablet 3  . Insulin Glargine, 1 Unit Dial, (TOUJEO SOLOSTAR) 300 UNIT/ML SOPN Inject 10-20 Units into the skin at bedtime. 2 pen 5  . Insulin Pen Needle (NOVOFINE) 32G X 6 MM MISC Inject insulin daily 100 each prn  . Lancets (ONETOUCH DELICA PLUS [id number]) MISC     . losartan (COZAAR) 25 MG tablet TAKE 1 TABLET BY MOUTH DAILY (Patient taking differently: Take 25 mg by mouth daily. ) 90 tablet 1  . Magnesium Oxide 420 MG TABS Take 1 tablet by mouth daily.    . metoprolol succinate (TOPROL-XL) 50 MG 24 hr tablet TAKE 1 TABLET BY MOUTH DAILY IMMEDIATELY FOLLOWING A MEAL 90 tablet 1  . niacin (NIASPAN) 500 MG CR tablet Take 1 tablet (500 mg total) by mouth at bedtime. 90 tablet 3  . ONE TOUCH ULTRA TEST test strip     . senna-docusate (SENOKOT-S) 8.6-50 MG tablet Take 1 tablet by mouth 2 (two) times daily. (Patient taking differently: Take 1 tablet by mouth as needed for mild constipation. )    . TRADJENTA 5 MG TABS tablet TAKE 1 TABLET BY MOUTH DAILY 90  tablet 1  . allopurinol (ZYLOPRIM) 300 MG tablet TAKE 1 TABLET BY MOUTH DAILY 90 tablet 1  . metFORMIN (  GLUCOPHAGE) 500 MG tablet TAKE 1 TABLET BY MOUTH TWICE DAILY WITH A MEAL 180 tablet 1   No facility-administered medications prior to visit.    Allergies  Allergen Reactions  . Lisinopril Cough    ROS Review of Systems    Objective:    Physical Exam  Constitutional: He is oriented to person, place, and time. He appears well-developed and well-nourished.  HENT:  Head: Normocephalic and atraumatic.  Cardiovascular: Normal rate, regular rhythm and normal heart sounds.  Pulmonary/Chest: Effort normal.  Crackles at the base of the left lung.  Neurological: He is alert and oriented to person, place, and time.  Skin: Skin is warm and dry.  Psychiatric: He has a normal mood and affect. His behavior is normal.    BP (!) 142/66   Pulse 98  Wt Readings from Last 3 Encounters:  03/26/19 161 lb (73 kg)  03/19/19 166 lb (75.3 kg)  12/08/18 170 lb (77.1 kg)     There are no preventive care reminders to display for this patient.  There are no preventive care reminders to display for this patient.  Lab Results  Component Value Date   TSH 0.99 03/28/2018   Lab Results  Component Value Date   WBC 7.7 08/01/2019   HGB 13.3 08/01/2019   HCT 39.6 08/01/2019   MCV 93.2 08/01/2019   PLT 259 08/01/2019   Lab Results  Component Value Date   NA 137 08/01/2019   K 3.3 (L) 08/01/2019   CO2 29 08/01/2019   GLUCOSE 175 (H) 08/01/2019   BUN 11 08/01/2019   CREATININE 0.88 08/01/2019   BILITOT 0.6 08/01/2019   ALKPHOS 60 11/24/2018   AST 11 08/01/2019   ALT 8 (L) 08/01/2019   PROT 6.5 08/01/2019   ALBUMIN 3.3 (L) 11/24/2018   CALCIUM 9.0 08/01/2019   ANIONGAP 11 12/08/2018   GFR 84.99 05/16/2013   Lab Results  Component Value Date   CHOL 195 08/10/2017   Lab Results  Component Value Date   HDL 40 (L) 08/10/2017   Lab Results  Component Value Date   LDLCALC 130 (H)  08/10/2017   Lab Results  Component Value Date   TRIG 125 08/10/2017   Lab Results  Component Value Date   CHOLHDL 4.9 08/10/2017   Lab Results  Component Value Date   HGBA1C 6.9 (A) 08/01/2019      Assessment & Plan:   Problem List Items Addressed This Visit      Respiratory   Oropharyngeal dysphagia   Relevant Orders   SLP modified barium swallow     Endocrine   Type 2 diabetes mellitus with complication, with long-term current use of insulin ([place]) - Primary    A1c looks great today.  Lab Results  Component Value Date   HGBA1C 6.9 (A) 08/01/2019         Relevant Orders   POCT glycosylated hemoglobin (Hb A1C) (Completed)   CBC with Differential/Platelet (Completed)   COMPLETE METABOLIC PANEL WITH GFR (Completed)     Other   Urinary incontinence with continuous leakage    At this point in time condom catheter not working because it comes off too easily especially at night because of size and position of the penis.  Recommend by [facility] including bed bag and penile clamp.  Order will be placed.      Fecal urgency    Other Visit Diagnoses    Cough       Relevant Orders   CBC  with Differential/Platelet (Completed)   COMPLETE METABOLIC PANEL WITH GFR (Completed)   DG Chest 2 View (Completed)   Lethargic       Relevant Orders   CBC with Differential/Platelet (Completed)   COMPLETE METABOLIC PANEL WITH GFR (Completed)   Acquired buried penis          Cough with lethargy-concerning for pneumonia.  We will get chest x-ray today since he does have some crackles at the left lung base.  Will call with results once available.  We will also go ahead and get a CBC to see if he has any elevated white count that might indicate [place] more systemic infection.  Oropharyngeal dysphagia-we will get him referred for a swallow study.  He did have swallowing problems after his stroke but again his daughter feels like she has noticed it is been more problematic over the last  2 months.  He was initially told he probably needed thickened liquids but has not been doing that.  Stool urgency-we discussed actually holding his Metformin for a couple of weeks.  Okay to continue using his insulin.  Blood sugars may run a little bit higher but I want to see if it could be aggravating or worsening his symptoms.  This is again been going on for months its not new but again we could still consider getting further work-up with a stool culture and C. difficile examination of stool as well.  No orders of the defined types were placed in this encounter.   Follow-up: Return if symptoms worsen or fail to improve.    Beatrice Lecher, MD

## 2019-08-02 ENCOUNTER — Telehealth: Payer: Self-pay | Admitting: Family Medicine

## 2019-08-02 DIAGNOSIS — R1312 Dysphagia, oropharyngeal phase: Secondary | ICD-10-CM | POA: Insufficient documentation

## 2019-08-02 DIAGNOSIS — R152 Fecal urgency: Secondary | ICD-10-CM | POA: Insufficient documentation

## 2019-08-02 LAB — COMPLETE METABOLIC PANEL WITH GFR
AG Ratio: 1.2 (calc) (ref 1.0–2.5)
ALT: 8 U/L — ABNORMAL LOW (ref 9–46)
AST: 11 U/L (ref 10–35)
Albumin: 3.5 g/dL — ABNORMAL LOW (ref 3.6–5.1)
Alkaline phosphatase (APISO): 58 U/L (ref 35–144)
BUN: 11 mg/dL (ref 7–25)
CO2: 29 mmol/L (ref 20–32)
Calcium: 9 mg/dL (ref 8.6–10.3)
Chloride: 99 mmol/L (ref 98–110)
Creat: 0.88 mg/dL (ref 0.70–1.11)
GFR, Est African American: 93 mL/min/{1.73_m2} (ref >=60)
GFR, Est Non African American: 80 mL/min/{1.73_m2} (ref >=60)
Globulin: 3 g/dL (calc) (ref 1.9–3.7)
Glucose, Bld: 175 mg/dL — ABNORMAL HIGH (ref 65–99)
Potassium: 3.3 mmol/L — ABNORMAL LOW (ref 3.5–5.3)
Sodium: 137 mmol/L (ref 135–146)
Total Bilirubin: 0.6 mg/dL (ref 0.2–1.2)
Total Protein: 6.5 g/dL (ref 6.1–8.1)

## 2019-08-02 LAB — CBC WITH DIFFERENTIAL/PLATELET
Absolute Monocytes: 647 cells/uL (ref 200–950)
Basophils Absolute: 31 cells/uL (ref 0–200)
Basophils Relative: 0.4 %
Eosinophils Absolute: 293 cells/uL (ref 15–500)
Eosinophils Relative: 3.8 %
HCT: 39.6 % (ref 38.5–50.0)
Hemoglobin: 13.3 g/dL (ref 13.2–17.1)
Lymphs Abs: 2195 cells/uL (ref 850–3900)
MCH: 31.3 pg (ref 27.0–33.0)
MCHC: 33.6 g/dL (ref 32.0–36.0)
MCV: 93.2 fL (ref 80.0–100.0)
MPV: 10.8 fL (ref 7.5–12.5)
Monocytes Relative: 8.4 %
Neutro Abs: 4535 cells/uL (ref 1500–7800)
Neutrophils Relative %: 58.9 %
Platelets: 259 10*3/uL (ref 140–400)
RBC: 4.25 10*6/uL (ref 4.20–5.80)
RDW: 13 % (ref 11.0–15.0)
Total Lymphocyte: 28.5 %
WBC: 7.7 10*3/uL (ref 3.8–10.8)

## 2019-08-02 MED ORDER — ESCITALOPRAM OXALATE 5 MG PO TABS: 5.0000 mg | ORAL_TABLET | Freq: Every day | ORAL | 2 refills | Status: DC

## 2019-08-02 NOTE — Assessment & Plan Note (Signed)
A1c looks great today.  Lab Results  Component Value Date   HGBA1C 6.9 (A) 08/01/2019

## 2019-08-02 NOTE — Telephone Encounter (Signed)
Pts daughter- Butch Penny  called and states he was just seen this week and said that Dr.Metheney was going to call in a new Diabetic med and Antidepressant med and it was not at the pharmacy. They also would like to get his test results and Mychart because they have lost access to Mychart

## 2019-08-02 NOTE — Telephone Encounter (Signed)
I called Butch Penny and she insist Dr Madilyn Fireman was going to send in a new DM medication and an anti-depressant. I did read the note from the visit and she just wanted to talk to Dr Madilyn Fireman or Kenney Houseman.

## 2019-08-02 NOTE — Telephone Encounter (Signed)
Sent over low-dose of Lexapro to take daily.  For now continue with insulin but just hold the Metformin for about 2 weeks.  I want to see if the diarrhea improves if it does not then we can just restart it.  If it does seem to be significantly better then we can try to replace the Metformin with a different type of oral diabetic medication.  If it during that 2 weeks the blood sugars are really going up and you are concerned then call us back sooner.

## 2019-08-05 NOTE — Telephone Encounter (Signed)
Spoke w/Donna and advised her that the anti-depressant was sent and that I would speak to Dr. Madilyn Fireman about diabetic medication.

## 2019-08-06 MED ORDER — EMPAGLIFLOZIN 10 MG PO TABS: 10.0000 mg | ORAL_TABLET | Freq: Every day | ORAL | 2 refills | Status: DC

## 2019-08-06 NOTE — Addendum Note (Signed)
Addended by: Beatrice Lecher D on: 08/06/2019 08:08 PM   Modules accepted: Orders

## 2019-08-06 NOTE — Telephone Encounter (Signed)
OK, new rx sent for Jardiance.  If the diarrhea is not better off of he metformin we can always consider going back to it

## 2019-08-06 NOTE — Telephone Encounter (Signed)
Pt's daughter would like to get him started on the additional oral diabetic medication. Will fwd to pcp.

## 2019-08-07 NOTE — Telephone Encounter (Signed)
Patient's daughter advised.  

## 2019-08-07 NOTE — Addendum Note (Signed)
Addended by: Beatrice Lecher D on: 08/07/2019 07:53 AM   Modules accepted: Orders

## 2019-08-08 ENCOUNTER — Other Ambulatory Visit (HOSPITAL_COMMUNITY): Payer: Self-pay | Admitting: Specialist

## 2019-08-08 DIAGNOSIS — R059 Cough, unspecified: Secondary | ICD-10-CM

## 2019-08-08 DIAGNOSIS — R1312 Dysphagia, oropharyngeal phase: Secondary | ICD-10-CM

## 2019-08-27 ENCOUNTER — Ambulatory Visit (HOSPITAL_COMMUNITY)
Admission: RE | Admit: 2019-08-27 | Discharge: 2019-08-27 | Disposition: A | Discharge status: Home / Self Care | Payer: Medicare Other | Source: Ambulatory Visit | Attending: Family Medicine | Admitting: Family Medicine

## 2019-08-27 ENCOUNTER — Encounter (HOSPITAL_COMMUNITY): Payer: Self-pay | Admitting: Speech Pathology

## 2019-08-27 ENCOUNTER — Other Ambulatory Visit: Payer: Self-pay

## 2019-08-27 ENCOUNTER — Ambulatory Visit (HOSPITAL_COMMUNITY): Payer: Medicare Other | Attending: Family Medicine | Admitting: Speech Pathology

## 2019-08-27 DIAGNOSIS — R1312 Dysphagia, oropharyngeal phase: Secondary | ICD-10-CM

## 2019-08-27 DIAGNOSIS — R1310 Dysphagia, unspecified: Secondary | ICD-10-CM | POA: Diagnosis not present

## 2019-08-27 DIAGNOSIS — R05 Cough: Secondary | ICD-10-CM | POA: Diagnosis not present

## 2019-08-27 DIAGNOSIS — R059 Cough, unspecified: Secondary | ICD-10-CM

## 2019-08-27 NOTE — Therapy (Signed)
[facility] [place], [place], [address] Phone: [phone]   Fax:  [phone]  Modified Barium Swallow  Patient Details  Name: Fred [name] MRN: [id number] Date of Birth: [birth year] No data recorded  Encounter Date: 08/27/2019   End of Session - 08/27/19 1816    Visit Number 1    Number of Visits 1    Authorization Type Medicare    SLP Start Time 1335    SLP Stop Time  1405    SLP Time Calculation (min) 30 min    Activity Tolerance Patient tolerated treatment well           Past Medical History:  Diagnosis Date  . Atrial fibrillation ([place])    Post operative  . CAD (coronary artery disease)    s/p cabg  . CHF (congestive heart failure) ([place])   . Diverticulosis   . DJD (degenerative joint disease)   . DM (diabetes mellitus) ([place])   . History of anemia   . History of stroke   . Hyperlipidemia   . Hypertension   . 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 Peripheral vascular disease ([place])   . Polyneuropathy, diabetic ([place])   . Seizure disorder, complex partial ([place])   . Syncope and collapse     Past Surgical History:  Procedure Laterality Date  . CATARACT EXTRACTION W/ INTRAOCULAR LENS  IMPLANT, BILATERAL  06/2013   Dr. [name]  . CORONARY ARTERY BYPASS GRAFT  02/13/2004   4 vesel  . removal of sebaceous cyst from neck  08/2007   Dr. [name]  . TONSILLECTOMY      There were no vitals filed for this visit.   Subjective Assessment - 08/27/19 1805    Subjective "I am doing alright."    Special Tests MBSS    Currently in Pain? No/denies               General - 08/27/19 1806      General Information   Date of Onset 08/07/19    HPI Kevan "Charlotte Crumb" Soltys is an 84 yo RH man with a history of hypertension, diabetes, CAD s/p CABG, post-operative atrial fibrillation, PVD, seizures, and recurrent strokes. His daughter noticed that he has had some cough but it is not necessarily  getting worse.  They have noticed though that he seems to be getting choked with his food it is happening almost every time he eats snouts been going on for about 2 months more consistently. He had an MBSS 08/15/2017 with a recommendation for D2 and thin liquids (pharyngeal swallow was normal) and he was discharged home from the hospital on a regular diet with thin liquids. He is referred for MBSS by his PCP, Dr. Beatrice Lecher due to the above concerns.    Type of Study MBS-Modified Barium Swallow Study    Previous Swallow Assessment 08/15/17 MBSS D2/thin    Diet Prior to this Study Regular;Thin liquids    Temperature Spikes Noted No    Respiratory Status Room air    History of Recent Intubation No    Behavior/Cognition Alert;Cooperative;Pleasant mood    Oral Cavity Assessment Within Functional Limits    Oral Care Completed by SLP No    Oral Cavity - Dentition Edentulous   Pt has dentures but doesn't wear to eat   Vision Functional for self feeding    Self-Feeding Abilities Able to feed self    Patient Positioning Upright in chair  Baseline Vocal Quality Normal    Volitional Cough Strong    Volitional Swallow Able to elicit    Anatomy Within functional limits    Pharyngeal Secretions Not observed secondary MBS              Oral Preparation/Oral Phase - 08/27/19 1807      Oral Preparation/Oral Phase   Oral Phase Impaired      Oral - Thin   Oral - Thin Teaspoon Within functional limits    Oral - Thin Cup Other (Comment);Oral residue   some oral holding   Oral - Thin Straw Other (Comment)   oral holding     Oral - Solids   Oral - Puree Within functional limits    Oral - Regular --   delayed oral transit   Oral - Pill Delayed A-P transit   able to propel on second swallow of liquid     Electrical stimulation - Oral Phase   Was Electrical Stimulation Used No            Pharyngeal Phase - 08/27/19 1811      Pharyngeal Phase   Pharyngeal Phase Impaired      Pharyngeal  - Thin   Pharyngeal- Thin Teaspoon Swallow initiation at vallecula;Reduced tongue base retraction;Pharyngeal residue - valleculae;Pharyngeal residue - pyriform;Lateral channel residue    Pharyngeal- Thin Cup Swallow initiation at vallecula;Reduced tongue base retraction;Pharyngeal residue - valleculae;Lateral channel residue;Pharyngeal residue - pyriform   cued dry swallow clears residue   Pharyngeal- Thin Straw Swallow initiation at vallecula;Reduced tongue base retraction;Pharyngeal residue - valleculae;Lateral channel residue;Pharyngeal residue - pyriform      Pharyngeal - Solids   Pharyngeal- Puree Within functional limits    Pharyngeal- Regular Delayed swallow initiation-vallecula;Pharyngeal residue - valleculae;Reduced tongue base retraction   min vallecular residue   Pharyngeal- Pill Within functional limits      Electrical Stimulation - Pharyngeal Phase   Was Electrical Stimulation Used No            Cricopharyngeal Phase - 08/27/19 1814      Cervical Esophageal Phase   Cervical Esophageal Phase Within functional limits             Plan - 08/27/19 1816    Clinical Impression Statement Pt presents with min oropharyngeal dysphagia with oral holding of bolus and decreased organization, swallow trigger at the level of the valleculae with liquids, min reduced tongue base retraction resulting in min base of tongue residue spilling to valleculae, lateral channels, and pyriforms which cleared with a cue to dry/repeat swallow. No penetration or aspiration observed. Recommend self regulated regular textures and thin liquids when sitting upright and swallow 2x for each sip. No further SLP services indicated at this time. Above discussed with Pt and his daughter.    Potential to Achieve Goals Good    Consulted and Agree with Plan of Care Patient;Family member/caregiver           Patient will benefit from skilled therapeutic intervention in order to improve the following deficits and  impairments:   Dysphagia, oropharyngeal phase     Recommendations/Treatment - 08/27/19 1815      Swallow Evaluation Recommendations   SLP Diet Recommendations Thin;Age appropriate regular    Liquid Administration via Cup;Straw    Medication Administration Whole meds with liquid    Supervision Patient able to self feed    Compensations Multiple dry swallows after each bite/sip    Postural Changes Seated upright at 90 degrees;Remain upright for at  least 30 minutes after feeds/meals            Prognosis - 08/27/19 1815      Prognosis   Prognosis for Safe Diet Advancement Good      Individuals Consulted   Consulted and Agree with Results and Recommendations Patient;Family member/caregiver    Family Member Consulted daughter    Report Sent to  Referring physician           Problem List Patient Active Problem List   Diagnosis Date Noted  . Oropharyngeal dysphagia 08/02/2019  . Fecal urgency 08/02/2019  . 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 Chronic diastolic (congestive) heart failure ([place])   . Hemiparesis affecting left side as late effect of cerebrovascular accident ([place])   . Diabetes mellitus type 2 in obese ([place])   . Small vessel disease ([place]) 08/11/2017  . Left hemiparesis ([place])   . History of CVA (cerebrovascular accident) 08/10/2017  . 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 of localized onset, not intractable, without status epilepticus ([place]) 06/20/2017  . Primary osteoarthritis of both knees 05/11/2017  . Stroke (cerebrum) ([place]) 03/21/2017  . Type 2 diabetes mellitus with complication, with long-term current use of insulin ([place]) 03/27/2015  . Atherosclerosis of native coronary artery of native heart without angina pectoris 03/27/2015  . Palpitations 11/04/2014  . S/P CABG (coronary artery bypass graft) 11/04/2014  . Atrial fibrillation, unspecified   . History of stroke 09/04/2014   . Obesity (BMI 30-39.9) 11/29/2013  . History of gout 05/16/2013  . CHF, chronic ([place]) 04/20/2009  . CARDIOVASCULAR FUNCTION STUDY, ABNORMAL 01/29/2009  . NECK PAIN 08/03/2007  . ANEMIA / OTHER 05/16/2007  . Coronary atherosclerosis 05/16/2007  . PAROXYSMAL ATRIAL FIBRILLATION 05/16/2007  . 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 Hyperlipidemia 03/30/2007  . DEGENERATIVE JOINT DISEASE 03/30/2007  . PAROTID LESION, UNSPECIFIED 09/29/2006  . Partial epilepsy with impairment of consciousness ([place]) 09/29/2006  . Diabetic polyneuropathy ([place]) 09/29/2006  . Essential hypertension 09/29/2006  . PERIPHERAL VASCULAR DISEASE 09/29/2006  . DIVERTICULOSIS 09/29/2006   Thank you,  Genene Churn, [place]  [facility] 08/27/2019, 6:19 PM  [facility] [address] [place], [place], [address] Phone: [phone]   Fax:  [phone]  Name: DORRELL MITCHELTREE MRN: [id number] Date of Birth: Feb 17, 1936

## 2019-09-20 ENCOUNTER — Telehealth: Payer: Self-pay

## 2019-09-20 NOTE — Telephone Encounter (Signed)
FYI - A rep from [employer] called stating that an additional form has been faxed. They are requesting for provider to complete the form so that they may send out the medical supplies to the patient.

## 2019-09-23 NOTE — Telephone Encounter (Signed)
I haven't received or seen these forms.

## 2019-09-27 ENCOUNTER — Telehealth: Payer: Self-pay

## 2019-09-27 NOTE — Telephone Encounter (Signed)
Kayla from [employer] called into office stating when the need for medical necessity was returned they faxed a form for [place] to fill out stating that the patient is not a candidate for condom catheter supported with  medical reasoning. This form was faxed to the office on 09/25/2019.

## 2019-09-30 NOTE — Telephone Encounter (Signed)
Tonya, please see previous previous messages. I believe patient needs a dme order

## 2019-09-30 NOTE — Telephone Encounter (Signed)
Crystal   I do not order these usually the CMA sends the order and information for DME.  Please route to the providers CMA due to I have not had anything to do with this order.   Thank you Jenny Reichmann

## 2019-10-04 ENCOUNTER — Other Ambulatory Visit: Payer: Self-pay | Admitting: Family Medicine

## 2019-10-04 NOTE — Telephone Encounter (Signed)
This was sent for him already.

## 2019-10-08 ENCOUNTER — Other Ambulatory Visit: Payer: Self-pay | Admitting: Family Medicine

## 2019-10-11 DIAGNOSIS — N3945 Continuous leakage: Secondary | ICD-10-CM | POA: Insufficient documentation

## 2019-10-11 NOTE — Assessment & Plan Note (Signed)
At this point in time condom catheter not working because it comes off too easily especially at night because of size and position of the penis.  Recommend by [facility] including bed bag and penile clamp.  Order will be placed.

## 2019-10-27 IMAGING — CT CT ANGIO NECK
2 of 7 series · 8 of 33 positions shown · IV contrast (iopamidol)
Comparison: Prior CT from earlier the same day.

CLINICAL DATA: Initial evaluation for acute aphasia.

EXAM:
CT ANGIOGRAPHY HEAD AND NECK
TECHNIQUE: Multidetector CT imaging of the head and neck was performed using
the standard protocol during bolus administration of intravenous
contrast. Multiplanar CT image reconstructions and MIPs were
obtained to evaluate the vascular anatomy. Carotid stenosis
measurements (when applicable) are obtained utilizing NASCET
criteria, using the distal internal carotid diameter as the
denominator.
CONTRAST:  50mL [id number] IOPAMIDOL ([id number]) INJECTION 76%

[Series 7: cta neck/head · axial · 0.45mm/px · z∈[-255,-151]mm · 2 of 158 slices shown]
[im 53/158  soft-tissue]
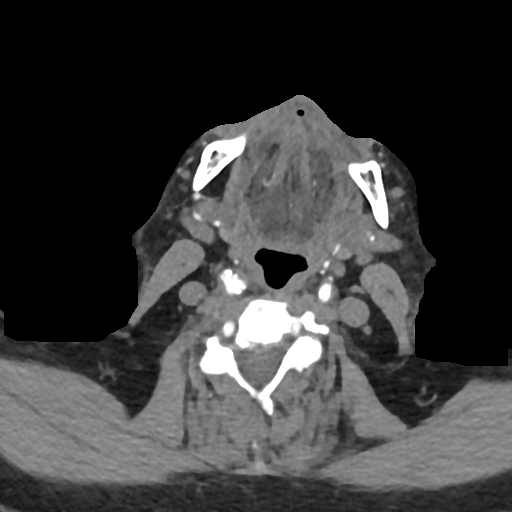
[im 105/158  soft-tissue]
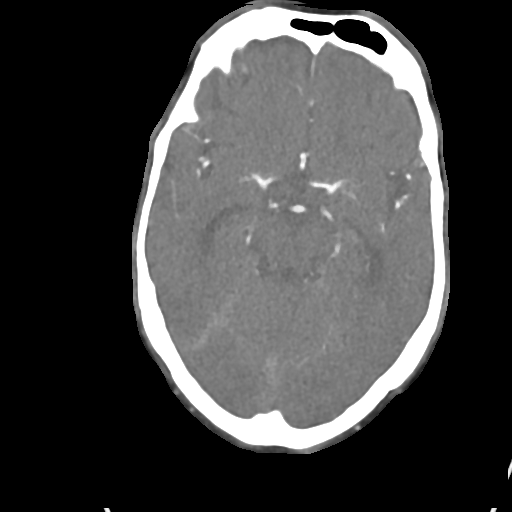

[Series 9: ax thins · axial · 0.39mm/px · z∈[-315,-94]mm · 6 of 311 slices shown]
[im 45/311  soft-tissue]
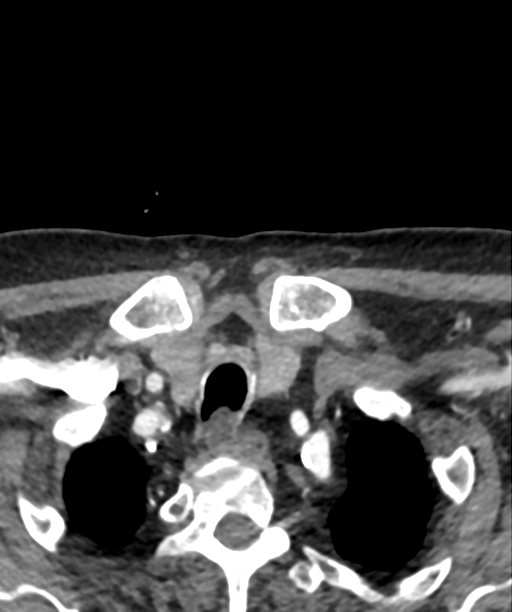
[im 89/311  bone]
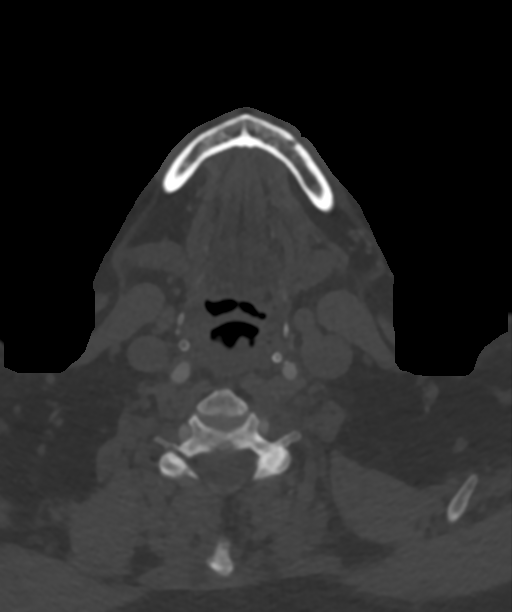
[im 133/311  soft-tissue]
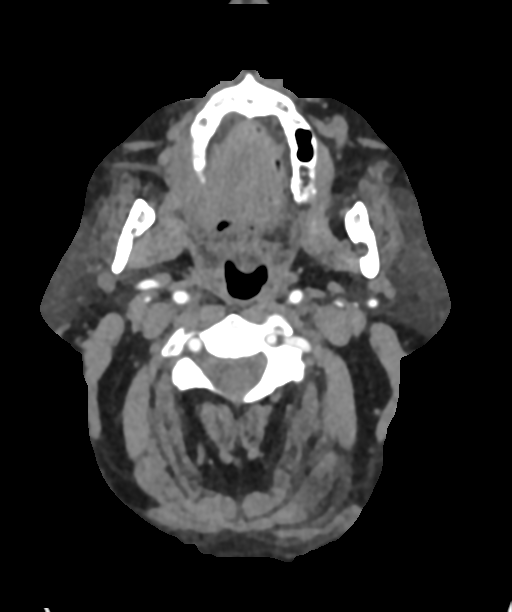
[im 178/311  bone]
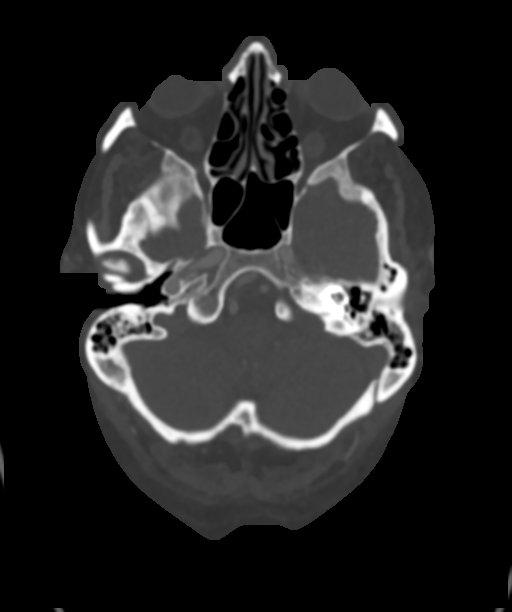
[im 222/311  soft-tissue]
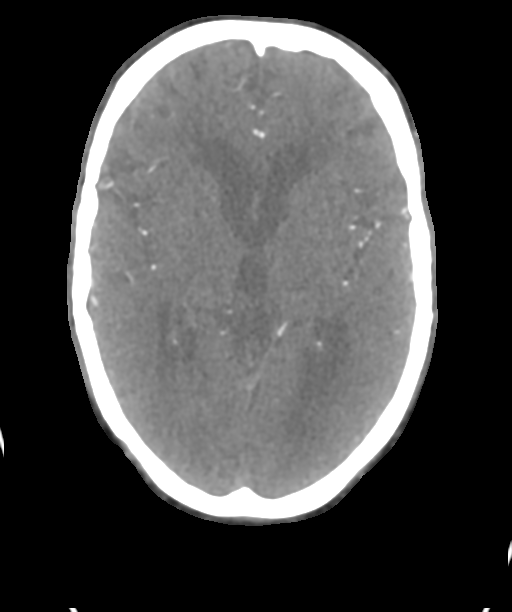
[im 266/311  bone]
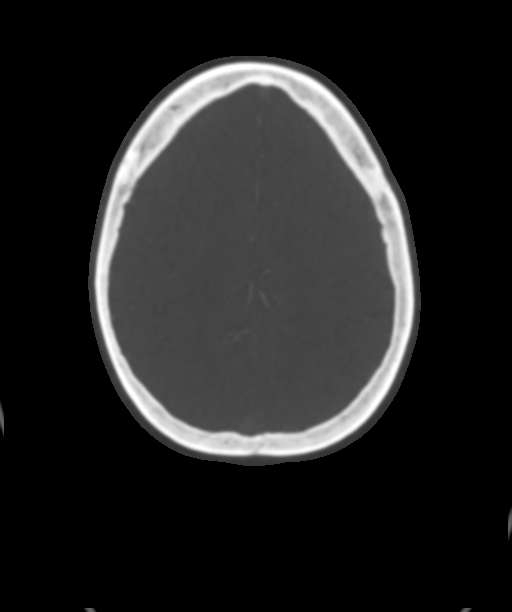

[8 of 33 positions shown; findings below may reference images not displayed]

FINDINGS: CTA NECK FINDINGS

Aortic arch: Visualized aortic arch of normal caliber with normal 3
vessel morphology. Calcified aortic atherosclerosis without
flow-limiting stenosis about the origin of the great vessels.
Visualized subclavian arteries widely patent.

Right carotid system: Scattered plaque within the right common
carotid artery without flow-limiting stenosis. Atheromatous stenosis
of up to approximately 50% by NASCET criteria at the origin of the
right ICA. Right ICA tortuous but otherwise patent to the skull
base.

Left carotid system: Left common carotid artery tortuous proximally
with scattered atheromatous irregularity but no high-grade stenosis.
Atheromatous stenosis of up to 75% by NASCET criteria at the
proximal left ICA. Left ICA tortuous but otherwise patent to the
skull base without high-grade stenosis or occlusion.

Vertebral arteries: Both of the vertebral arteries arise from the
subclavian arteries. Focal plaque at the origin of the vertebral
arteries bilaterally. Associated moderate approximate 50% stenosis
at the origins of the vertebral arteries bilaterally. Right
vertebral artery dominant. Vertebral arteries otherwise patent
within the neck without stenosis or occlusion.

Skeleton: No acute osseus abnormality. No worrisome lytic or blastic
osseous lesions. Moderate degenerate spondylolysis noted at C6-7.

Other neck: Soft tissues of the neck demonstrate no acute
abnormality. Salivary glands normal. Thyroid normal. No adenopathy.

Upper chest: Upper chest within normal limits. Visualized lungs are
clear. Median sternotomy wires noted.

Review of the MIP images confirms the above findings

CTA HEAD FINDINGS

Anterior circulation: Petrous segments patent bilaterally without
stenosis. Multifocal calcified plaque throughout the carotid siphons
with secondary mild to moderate multifocal narrowing, most notable
at the supraclinoid segments bilaterally. ICA termini widely patent.
A1 segments patent without flow-limiting stenosis. Normal anterior
communicating artery. Anterior cerebral arteries widely patent to
their distal aspects without stenosis.

M1 segments patent without stenosis or occlusion. Left M1 bifurcates
early. Normal MCA bifurcations. No proximal M2 occlusion. Distal MCA
branches well perfused and symmetric.

Posterior circulation: Bilateral V4 atheromatous plaque with mild
multifocal stenoses. Right vertebral artery dominant. Patent right
PICA. Left PICA not visualized. Basilar artery patent to its distal
aspect without stenosis. Superior cerebral arteries patent
bilaterally. Both of the posterior cerebral arteries primarily
supplied via the basilar. Atheromatous irregularity throughout the
P2 segments bilaterally without flow-limiting stenosis.

Venous sinuses: Grossly patent.

Anatomic variants: None significant.  No aneurysm.

Delayed phase: Not performed.

Review of the MIP images confirms the above findings
IMPRESSION: 1. Negative CTA for emergent large vessel occlusion.
2. Atheromatous plaque about the carotid bifurcations/proximal ICAs
bilaterally with associated stenosis of up to 75% on the left and
50% on the right.
3. Atheromatous plaque at the origin of the vertebral arteries
bilaterally with associated moderate stenoses.
4. Moderate carotid siphon atherosclerosis with mild to moderate
multifocal narrowing.

These results were communicated to [place] at [DATE] pmon 03/21/2017by
text page via the [employer] messaging system.

## 2019-10-27 IMAGING — DX DG CHEST 1V PORT
1 series · 1 of 1 positions shown · non-contrast
Comparison: 05/09/2016

CLINICAL DATA: Stroke.

EXAM:
PORTABLE CHEST 1 VIEW

[chest ap]
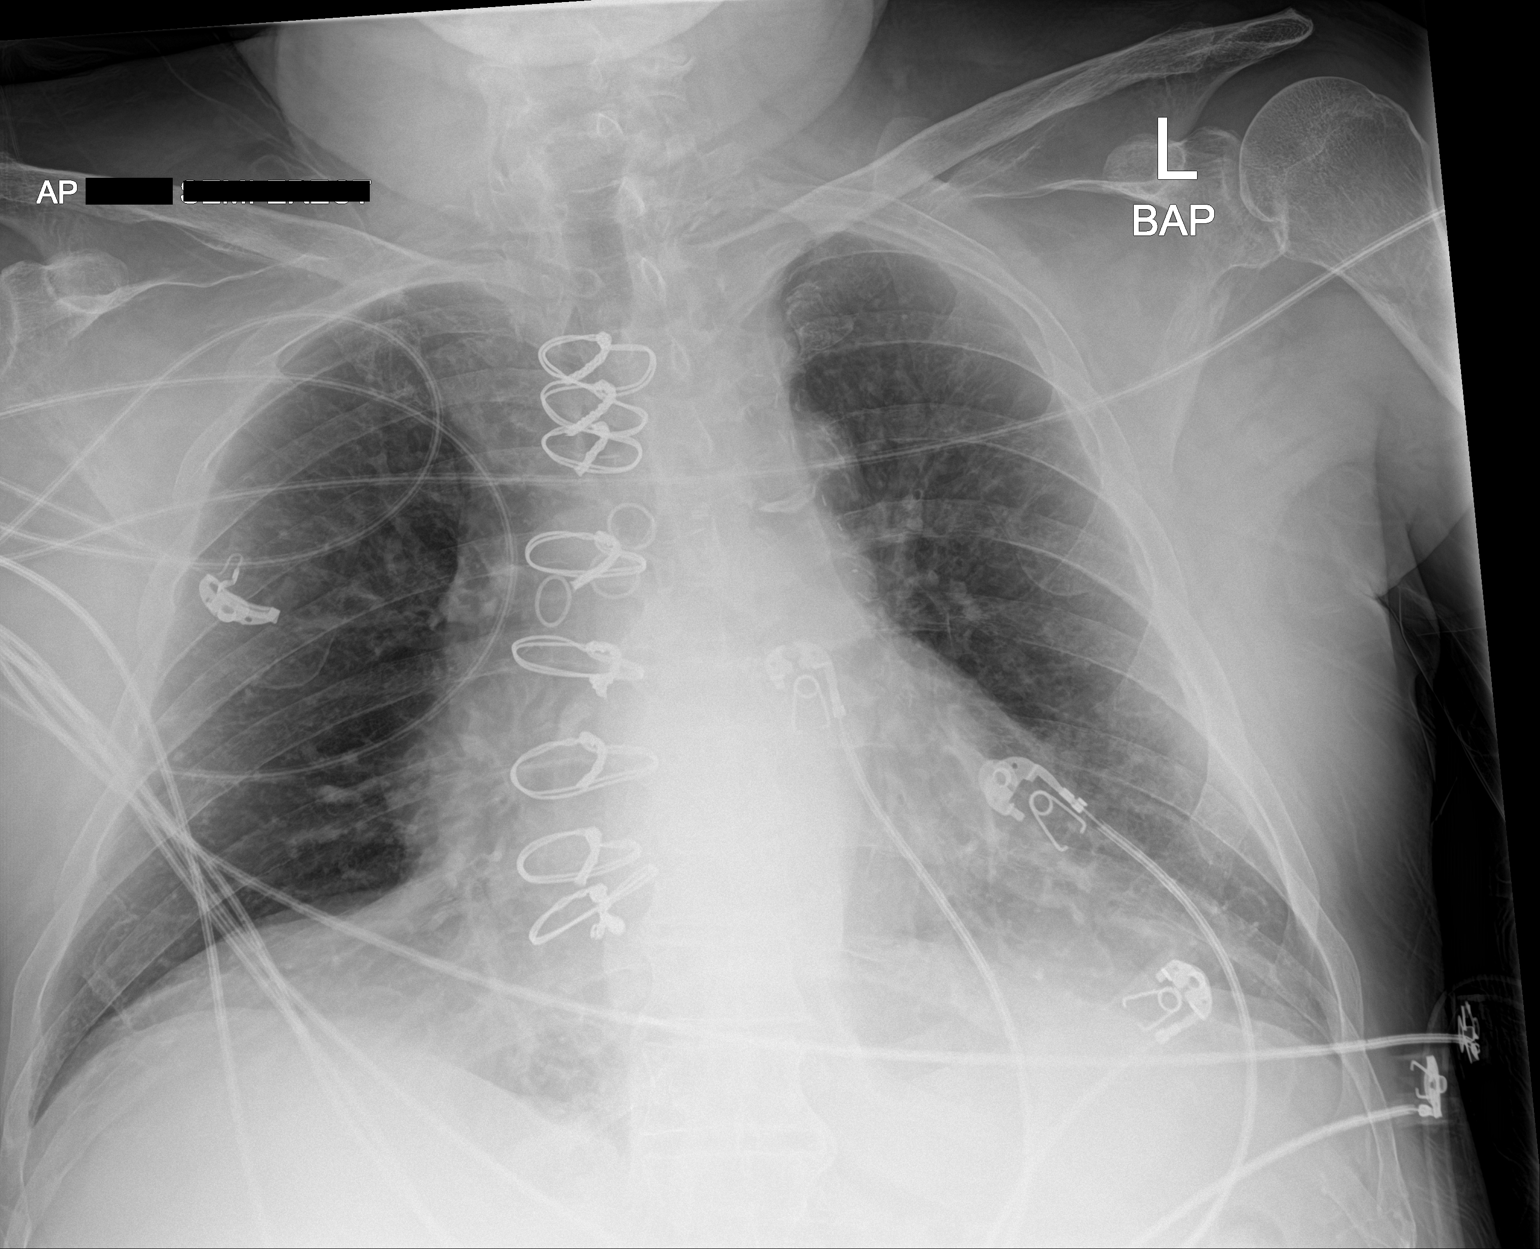

[1 of 1 positions shown; findings below may reference images not displayed]

FINDINGS: Postsurgical changes from CABG.

Enlarged cardiac silhouette. Tortuosity and calcific atherosclerotic
disease of the aorta. Mediastinal contours appear intact.

There is no evidence of focal airspace consolidation, pleural
effusion or pneumothorax.

Osseous structures are without acute abnormality. Soft tissues are
grossly normal.
IMPRESSION: Enlarged cardiac silhouette.

Calcific atherosclerotic disease of the aorta.

## 2019-10-29 ENCOUNTER — Other Ambulatory Visit: Payer: Self-pay | Admitting: Family Medicine

## 2019-11-20 ENCOUNTER — Other Ambulatory Visit: Payer: Self-pay | Admitting: Family Medicine

## 2019-11-29 ENCOUNTER — Encounter: Payer: Self-pay | Admitting: Family Medicine

## 2019-11-29 ENCOUNTER — Ambulatory Visit (INDEPENDENT_AMBULATORY_CARE_PROVIDER_SITE_OTHER): Payer: Medicare Other | Admitting: Family Medicine

## 2019-11-29 VITALS — BP 132/61 | HR 84

## 2019-11-29 DIAGNOSIS — E669 Obesity, unspecified: Secondary | ICD-10-CM | POA: Diagnosis not present

## 2019-11-29 DIAGNOSIS — Z794 Long term (current) use of insulin: Secondary | ICD-10-CM | POA: Diagnosis not present

## 2019-11-29 DIAGNOSIS — N3945 Continuous leakage: Secondary | ICD-10-CM | POA: Diagnosis not present

## 2019-11-29 DIAGNOSIS — E1169 Type 2 diabetes mellitus with other specified complication: Secondary | ICD-10-CM

## 2019-11-29 DIAGNOSIS — I2510 Atherosclerotic heart disease of native coronary artery without angina pectoris: Secondary | ICD-10-CM | POA: Diagnosis not present

## 2019-11-29 DIAGNOSIS — E118 Type 2 diabetes mellitus with unspecified complications: Secondary | ICD-10-CM

## 2019-11-29 DIAGNOSIS — Z23 Encounter for immunization: Secondary | ICD-10-CM | POA: Diagnosis not present

## 2019-11-29 DIAGNOSIS — I509 Heart failure, unspecified: Secondary | ICD-10-CM | POA: Diagnosis not present

## 2019-11-29 DIAGNOSIS — I10 Essential (primary) hypertension: Secondary | ICD-10-CM | POA: Diagnosis not present

## 2019-11-29 LAB — POCT GLYCOSYLATED HEMOGLOBIN (HGB A1C): Hemoglobin A1C: 7.5 % — AB (ref 4.0–5.6)

## 2019-11-29 MED ORDER — LINAGLIPTIN 5 MG PO TABS: 5.0000 mg | ORAL_TABLET | Freq: Every day | ORAL | 1 refills | Status: DC

## 2019-11-29 MED ORDER — LOSARTAN POTASSIUM 25 MG PO TABS: 25.0000 mg | ORAL_TABLET | Freq: Every day | ORAL | 1 refills | Status: DC

## 2019-11-29 MED ORDER — TOUJEO SOLOSTAR 300 UNIT/ML ~~LOC~~ SOPN: 22.0000 [IU] | PEN_INJECTOR | Freq: Every day | SUBCUTANEOUS | 3 refills | Status: DC

## 2019-11-29 NOTE — Assessment & Plan Note (Signed)
No sign of volume overload on exam today. °

## 2019-11-29 NOTE — Assessment & Plan Note (Signed)
Uncontrolled. A1C elevated to 7.5 but he admits he is only doing his insulin shot every 2 to 3 days instead of daily.  He has not had any low blood sugars.  Says he still taking the pills regularly.

## 2019-11-29 NOTE — Patient Instructions (Signed)
Please schedule eye exam this fall.

## 2019-11-29 NOTE — Progress Notes (Addendum)
Established Patient Office Visit  Subjective:  Patient ID: Fred [name], male    DOB: 1935-03-23  Age: 84 y.o. MRN: [id number]  CC:  Chief Complaint  Patient presents with   Diabetes    HPI Fred [name] presents for Diabetes - no hypoglycemic events. No wounds or sores that are not healing well. No increased thirst or urination. Checking glucose at home. Taking medications as prescribed without any side effects.  Notes he is only using his glargine every 2-3 nights.  He is doing 22 units.  Hypertension- Pt denies chest pain, SOB, dizziness, or heart palpitations.  Taking meds as directed w/o problems.  Denies medication side effects.     F/U Urinary incontinence - using Men's Liberty including bed bag and penile clamp.     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 [name]   CORONARY ARTERY BYPASS GRAFT  02/13/2004   4 vesel   removal of sebaceous cyst from neck  08/2007   Dr. [name]   TONSILLECTOMY      Family History  Problem Relation Age of Onset   Epilepsy Father    Stroke Mother    Lung cancer Other     Social History   Socioeconomic History   Marital status: Married    Spouse name: Fred [name]   Number of children: 3   Years of education: Not on file   Highest education level: Not on file  Occupational  History   Occupation: retired     Comment: retired from [employer] and from [employer], trade [employer] and [employer] Use   Smoking status: Former Smoker    Packs/day: 1.00    Years: 22.00    Pack years: 22.00    Types: Cigarettes    Quit date: 03/14/1977    Years since quitting: 43.0   Smokeless tobacco: Never Used  Vaping Use   Vaping Use: Never used  Substance and Sexual Activity   Alcohol use: Not Currently    Comment: Pt has not had alcohol since 03/2017 CVA   Drug use: No   Sexual activity: Never  Other Topics Concern   Not on file  Social History Narrative   3 caffeinated drinks per day. Mostly coffee. He walks 15 minutes 3 days per week. Quit smoking in 1978.      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    Social Determinants of Health   Financial Resource Strain: Not on file  Food Insecurity: Not on file  Transportation Needs: Not on file  Physical Activity: Not on file  Stress: Not on file  Social Connections: Not on file  Intimate Partner  Violence: Not on file    Outpatient Medications Prior to Visit  Medication Sig Dispense Refill   acetaminophen (TYLENOL) 325 MG tablet Take 2 tablets (650 mg total) by mouth every 4 (four) hours as needed for mild pain (or temp > 37.5 C (99.5 F)).     atorvastatin (LIPITOR) 80 MG tablet TAKE 1 TABLET(80 MG) BY MOUTH DAILY 90 tablet 3   blood glucose meter kit and supplies Check blood sugar 3 times daily. 1 each 0   Cholecalciferol (VITAMIN D) 50 MCG (2000 UT) tablet Take 1 tablet by mouth daily.     clopidogrel (PLAVIX) 75 MG tablet Take 1 tablet (75 mg total) by mouth daily. 90 tablet 3   Coenzyme Q10 (COQ10) 100 MG CAPS Take 300 mg by mouth daily.      divalproex (DEPAKOTE ER) 500 MG 24 hr tablet Take 1 tablet (500 mg total) by mouth daily. 90 tablet 3   Lancets (ONETOUCH DELICA PLUS LANCET30G) MISC      Magnesium Oxide 420 MG TABS Take 1  tablet by mouth daily.     ONE TOUCH ULTRA TEST test strip      senna-docusate (SENOKOT-S) 8.6-50 MG tablet Take 1 tablet by mouth 2 (two) times daily. (Patient taking differently: Take 1 tablet by mouth as needed for mild constipation. )     allopurinol (ZYLOPRIM) 300 MG tablet TAKE 1 TABLET BY MOUTH DAILY 90 tablet 1   escitalopram (LEXAPRO) 5 MG tablet TAKE 1 TABLET(5 MG) BY MOUTH DAILY 30 tablet 2   ezetimibe (ZETIA) 10 MG tablet Take 1 tablet (10 mg total) by mouth daily. 90 tablet 3   Insulin Glargine, 1 Unit Dial, (TOUJEO SOLOSTAR) 300 UNIT/ML SOPN Inject 10-20 Units into the skin at bedtime. 2 pen 5   Insulin Pen Needle (NOVOFINE) 32G X 6 MM MISC Inject insulin daily 100 each prn   JARDIANCE 10 MG TABS tablet TAKE 1 TABLET(10 MG) BY MOUTH DAILY BEFORE BREAKFAST 30 tablet 2   losartan (COZAAR) 25 MG tablet TAKE 1 TABLET BY MOUTH DAILY (Patient taking differently: Take 25 mg by mouth daily. ) 90 tablet 1   metoprolol succinate (TOPROL-XL) 50 MG 24 hr tablet TAKE 1 TABLET BY MOUTH DAILY IMMEDIATELY FOLLOWING A MEAL 90 tablet 1   niacin (NIASPAN) 500 MG CR tablet Take 1 tablet (500 mg total) by mouth at bedtime. 90 tablet 3   TRADJENTA 5 MG TABS tablet TAKE 1 TABLET BY MOUTH DAILY 90 tablet 1   No facility-administered medications prior to visit.    Allergies  Allergen Reactions   Lisinopril Cough    ROS Review of Systems    Objective:    Physical Exam Constitutional:      Appearance: He is well-developed.  HENT:     Head: Normocephalic and atraumatic.  Cardiovascular:     Rate and Rhythm: Normal rate and regular rhythm.     Heart sounds: Normal heart sounds.  Pulmonary:     Effort: Pulmonary effort is normal.     Breath sounds: Normal breath sounds.  Musculoskeletal:     Comments: He has drying of his left hand.  Skin:    General: Skin is warm and dry.  Neurological:     Mental Status: He is alert and oriented to person, place, and time.  Psychiatric:         Behavior: Behavior normal.     BP 132/61    Pulse 84    SpO2 96%  Wt Readings  from Last 3 Encounters:  03/26/19 161 lb (73 kg)  03/19/19 166 lb (75.3 kg)  12/08/18 170 lb (77.1 kg)     Health Maintenance Due  Topic Date Due   OPHTHALMOLOGY EXAM  11/12/2019   COVID-19 Vaccine (3 - Booster for Moderna series) 12/12/2019    There are no preventive care reminders to display for this patient.  Lab Results  Component Value Date   TSH 0.99 03/28/2018   Lab Results  Component Value Date   WBC 7.7 08/01/2019   HGB 13.3 08/01/2019   HCT 39.6 08/01/2019   MCV 93.2 08/01/2019   PLT 259 08/01/2019   Lab Results  Component Value Date   NA 137 08/01/2019   K 3.3 (L) 08/01/2019   CO2 29 08/01/2019   GLUCOSE 175 (H) 08/01/2019   BUN 11 08/01/2019   CREATININE 0.88 08/01/2019   BILITOT 0.6 08/01/2019   ALKPHOS 60 11/24/2018   AST 11 08/01/2019   ALT 8 (L) 08/01/2019   PROT 6.5 08/01/2019   ALBUMIN 3.3 (L) 11/24/2018   CALCIUM 9.0 08/01/2019   ANIONGAP 11 12/08/2018   GFR 84.99 05/16/2013   Lab Results  Component Value Date   CHOL 195 08/10/2017   Lab Results  Component Value Date   HDL 40 (L) 08/10/2017   Lab Results  Component Value Date   LDLCALC 130 (H) 08/10/2017   Lab Results  Component Value Date   TRIG 125 08/10/2017   Lab Results  Component Value Date   CHOLHDL 4.9 08/10/2017   Lab Results  Component Value Date   HGBA1C 7.5 (A) 11/29/2019      Assessment & Plan:   Problem List Items Addressed This Visit      Cardiovascular and Mediastinum   Essential hypertension    Well controlled. Continue current regimen. Follow up in  3-4 months.       Relevant Medications   losartan (COZAAR) 25 MG tablet   CHF, chronic (HCC)    No sign of volume overload on exam today.      Relevant Medications   losartan (COZAAR) 25 MG tablet   Atherosclerosis of native coronary artery of native heart without angina pectoris    That is post CABG-doing  well on Plavix.      Relevant Medications   losartan (COZAAR) 25 MG tablet     Endocrine   Type 2 diabetes mellitus with complication, with long-term current use of insulin (HCC) - Primary   Relevant Medications   insulin glargine, 1 Unit Dial, (TOUJEO SOLOSTAR) 300 UNIT/ML Solostar Pen   losartan (COZAAR) 25 MG tablet   linagliptin (TRADJENTA) 5 MG TABS tablet   Other Relevant Orders   POCT glycosylated hemoglobin (Hb A1C) (Completed)   Diabetes mellitus type 2 in obese (HCC)    Uncontrolled. A1C elevated to 7.5 but he admits he is only doing his insulin shot every 2 to 3 days instead of daily.  He has not had any low blood sugars.  Says he still taking the pills regularly.      Relevant Medications   insulin glargine, 1 Unit Dial, (TOUJEO SOLOSTAR) 300 UNIT/ML Solostar Pen   losartan (COZAAR) 25 MG tablet   linagliptin (TRADJENTA) 5 MG TABS tablet     Other   Urinary incontinence with continuous leakage    Using Men's Liberty including bed bag and penile clamp. Working well.        Other Visit Diagnoses    Need for immunization against influenza  Relevant Orders   Flu Vaccine QUAD High Dose(Fluad) (Completed)      Meds ordered this encounter  Medications   insulin glargine, 1 Unit Dial, (TOUJEO SOLOSTAR) 300 UNIT/ML Solostar Pen    Sig: Inject 22 Units into the skin at bedtime.    Dispense:  18 mL    Refill:  3   losartan (COZAAR) 25 MG tablet    Sig: Take 1 tablet (25 mg total) by mouth daily.    Dispense:  90 tablet    Refill:  1   linagliptin (TRADJENTA) 5 MG TABS tablet    Sig: Take 1 tablet (5 mg total) by mouth daily.    Dispense:  90 tablet    Refill:  1    Follow-up: Return in about 3 months (around 02/28/2020) for Diabetes follow-up.    Beatrice Lecher, MD

## 2019-11-29 NOTE — Assessment & Plan Note (Signed)
That is post CABG-doing well on Plavix.

## 2019-11-29 NOTE — Assessment & Plan Note (Signed)
Well controlled. Continue current regimen. Follow up in  3-4 months.  

## 2019-12-01 ENCOUNTER — Other Ambulatory Visit: Payer: Self-pay | Admitting: Sports Medicine

## 2019-12-01 DIAGNOSIS — I2510 Atherosclerotic heart disease of native coronary artery without angina pectoris: Secondary | ICD-10-CM

## 2019-12-02 NOTE — Telephone Encounter (Signed)
To PCP

## 2019-12-31 ENCOUNTER — Other Ambulatory Visit: Payer: Self-pay | Admitting: Family Medicine

## 2020-01-01 ENCOUNTER — Other Ambulatory Visit: Payer: Self-pay | Admitting: Family Medicine

## 2020-01-30 ENCOUNTER — Telehealth: Payer: Self-pay

## 2020-01-30 NOTE — Telephone Encounter (Signed)
Debbie called requesting call back from [facility] inquiring about an order for Condom Cath for this patient.

## 2020-02-06 ENCOUNTER — Other Ambulatory Visit: Payer: Self-pay | Admitting: Family Medicine

## 2020-02-28 ENCOUNTER — Ambulatory Visit: Payer: Medicare Other | Admitting: Family Medicine

## 2020-03-03 NOTE — Telephone Encounter (Signed)
Order for condom cath has been faxed.

## 2020-03-10 NOTE — Assessment & Plan Note (Signed)
Using [employer]'s [employer] including bed bag and penile clamp. Working well.

## 2020-03-17 ENCOUNTER — Telehealth: Payer: Self-pay | Admitting: Family Medicine

## 2020-03-17 IMAGING — MR MR HEAD W/O CM
10 of 11 series · 42 of 48 positions shown · non-contrast
Comparison: 08/09/2017 CT and CTA of the head. 03/21/2017 MRI head.

CLINICAL DATA: 82 y/o M; left-sided weakness and facial droop.
History of stroke.

EXAM:
MRI HEAD WITHOUT CONTRAST
TECHNIQUE: Multiplanar, multiecho pulse sequences of the brain and surrounding
structures were obtained without intravenous contrast.

[Series 5: ax dwi_tracew · axial · 3.0mm · 1.50mm/px · z∈[-73,+67]mm · 8 of 80 slices shown]
[im 1/80]
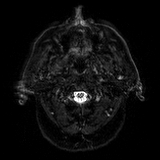
[im 12/80]
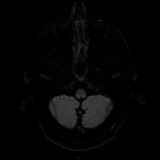
[im 23/80]
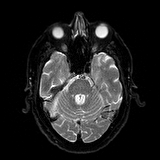
[im 34/80]
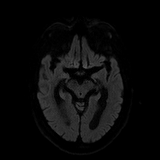
[im 46/80]
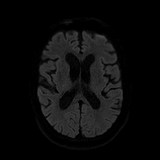
[im 57/80]
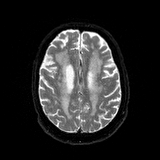
[im 68/80]
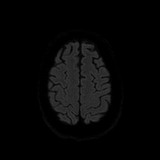
[im 80/80]
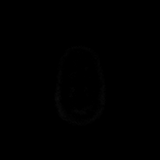

[Series 6: ax dwi_adc · axial · 3.0mm · 1.50mm/px · z∈[-73,+67]mm · 4 of 40 slices shown]
[im 1/40]
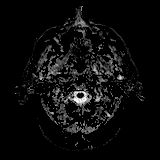
[im 14/40]
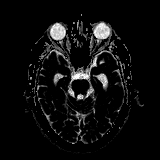
[im 27/40]
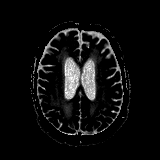
[im 40/40]
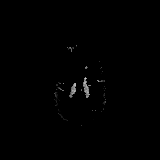

[Series 7: DWI · coronal · 4.0mm · 0.88mm/px · 7 of 70 slices shown (1 of 2)]
[im 1/70]
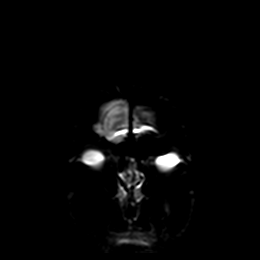
[im 12/70]
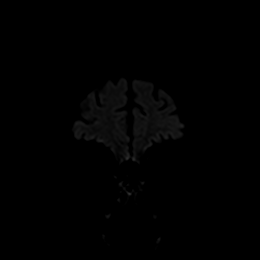
[im 24/70]
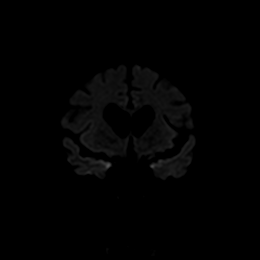
[im 35/70]
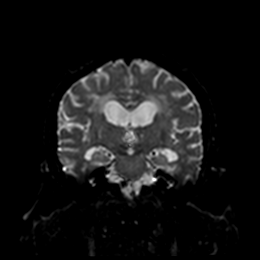
[im 47/70]
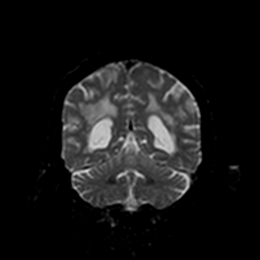
[im 58/70]
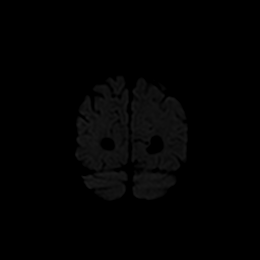
[im 70/70]
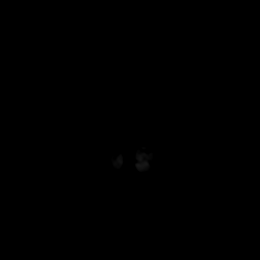

[Series 8: DWI · coronal · 4.0mm · 0.88mm/px · 3 of 35 slices shown (2 of 2)]
[im 1/35]
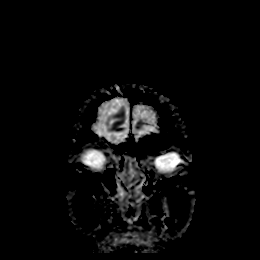
[im 18/35]
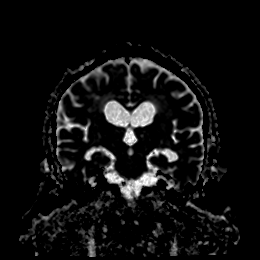
[im 35/35]
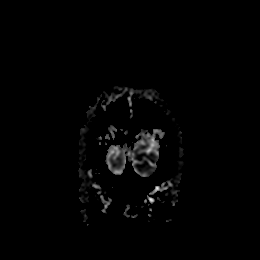

[Series 9: T2 · axial · 5.0mm · 0.69mm/px · z∈[-77,+67]mm · 2 of 25 slices shown (1 of 2)]
[im 1/25]
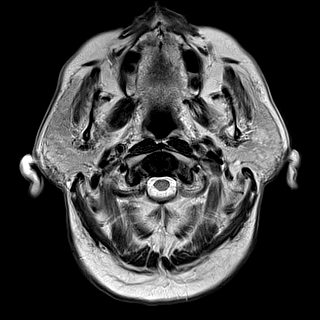
[im 25/25]
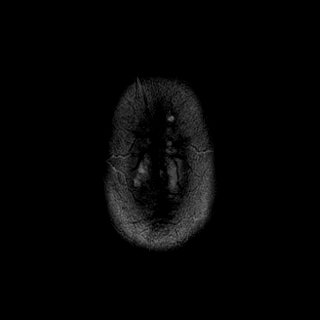

[Series 10: FLAIR · axial · 5.0mm · 0.43mm/px · z∈[-78,+66]mm · 2 of 25 slices shown]
[im 1/25]
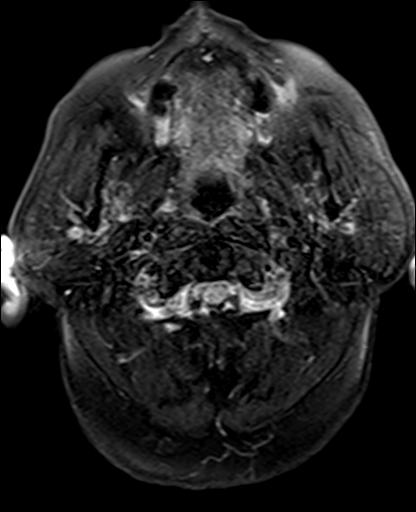
[im 25/25]
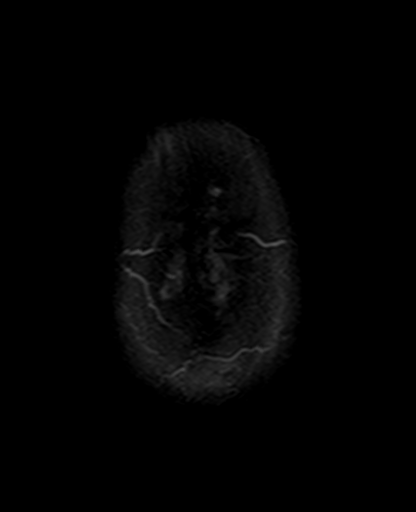

[Series 11: swi_images · axial · 3.0mm · 0.86mm/px · z∈[-86,+90]mm · 6 of 60 slices shown]
[im 1/60]
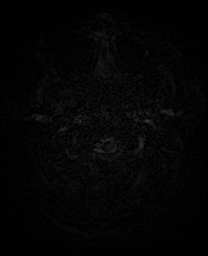
[im 12/60]
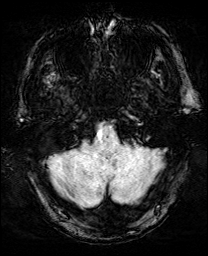
[im 24/60]
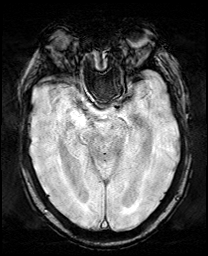
[im 36/60]
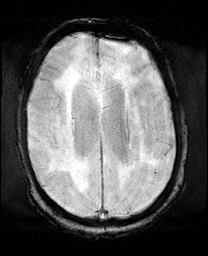
[im 48/60]
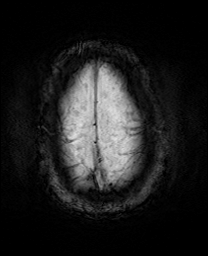
[im 60/60]
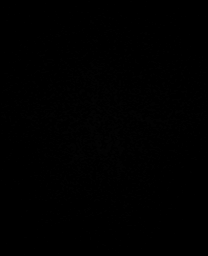

[Series 12: mip_images(sw) · axial · 24.0mm · 0.86mm/px · z∈[-76,+80]mm · 5 of 53 slices shown]
[im 1/53]
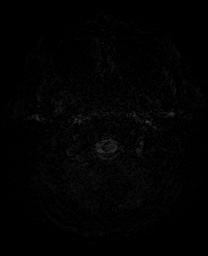
[im 14/53]
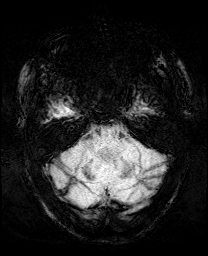
[im 27/53]
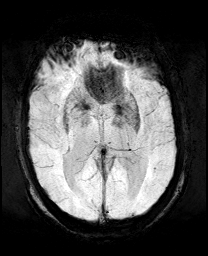
[im 40/53]
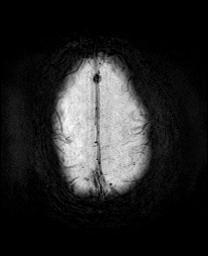
[im 53/53]
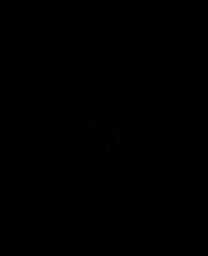

[Series 13: T1 · sagittal · 5.0mm · 0.75mm/px · 2 of 23 slices shown]
[im 1/23]
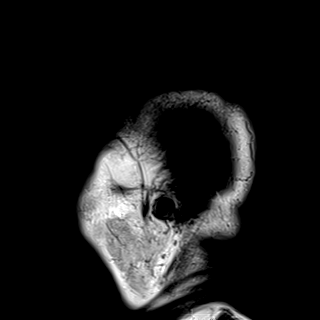
[im 23/23]
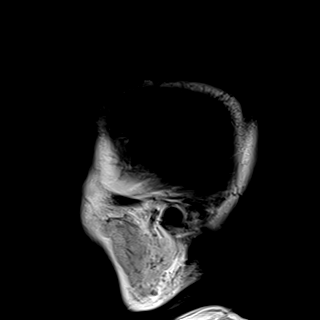

[Series 15: T2 · coronal · 5.0mm · 0.72mm/px · 3 of 29 slices shown (2 of 2)]
[im 1/29]
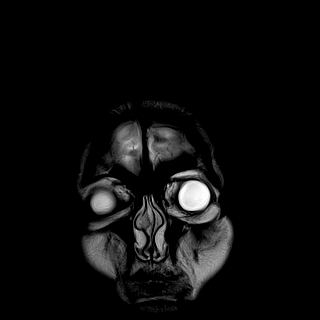
[im 15/29]
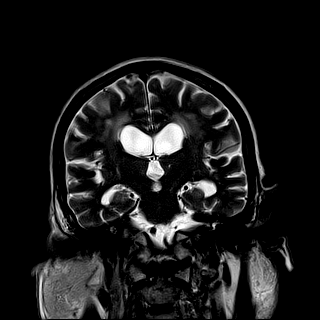
[im 29/29]
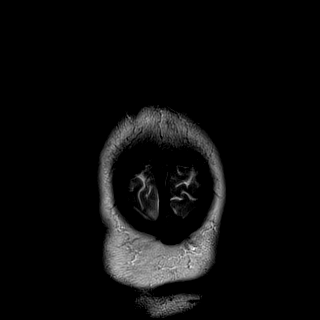

[42 of 48 positions shown; findings below may reference images not displayed]

FINDINGS: Brain: Subcentimeter focus of reduced diffusion within the right
posterior limb of internal capsule (series 5 image 62, series 6,
image 22, series 7, image 53) compatible with acute/early subacute
infarction. No associated hemorrhage or mass effect.

Stable patchy confluent nonspecific foci of T2 FLAIR hyperintense
signal abnormality in subcortical and periventricular white matter
are compatible with advanced chronic microvascular ischemic changes
for age. Moderate brain parenchymal volume loss. No new
susceptibility hypointensity to indicate interval intracranial
hemorrhage. No structural abnormality of the brain. Hippocampi are
symmetric in size and signal. No extra-axial collection,
hydrocephalus, or effacement of basilar cisterns.

Vascular: Normal flow voids.

Skull and upper cervical spine: Normal marrow signal.

Sinuses/Orbits: Negative.

Other: None.
IMPRESSION: 1. Subcentimeter acute/early subacute infarction within the right
posterior limb of internal capsule. No associated hemorrhage or mass
effect.
2. Stable advanced chronic microvascular ischemic changes and
moderate parenchymal volume loss of the brain.

These results were called by telephone at the time of interpretation
on 08/10/2017 at [DATE] to Dr. CVILI LIDMILA , who verbally
acknowledged these results.

By: Valdett Vie M.D.

## 2020-03-17 NOTE — Telephone Encounter (Signed)
Misty called from [facility] re: patient's orders for his condom cath and also some notes and signed orders. Please call Misty back today at [phone]

## 2020-03-24 NOTE — Telephone Encounter (Signed)
Form completed,faxed,confirmation received and scanned into patient's chart.

## 2020-04-05 ENCOUNTER — Other Ambulatory Visit: Payer: Self-pay | Admitting: Neurology

## 2020-04-08 ENCOUNTER — Telehealth: Payer: Self-pay

## 2020-04-08 NOTE — Telephone Encounter (Signed)
Misty with [employer] called and left a message wanting a return call. They are calling about supplies for [facility].    [phone]

## 2020-04-11 ENCOUNTER — Other Ambulatory Visit: Payer: Self-pay | Admitting: Neurology

## 2020-04-16 ENCOUNTER — Other Ambulatory Visit: Payer: Self-pay | Admitting: *Deleted

## 2020-04-16 MED ORDER — EMPAGLIFLOZIN 10 MG PO TABS
ORAL_TABLET | ORAL | 1 refills | Status: DC
Start: 2020-04-16 — End: 2020-07-27

## 2020-04-25 ENCOUNTER — Other Ambulatory Visit: Payer: Self-pay | Admitting: Family Medicine

## 2020-04-25 DIAGNOSIS — E669 Obesity, unspecified: Secondary | ICD-10-CM

## 2020-04-25 DIAGNOSIS — E1169 Type 2 diabetes mellitus with other specified complication: Secondary | ICD-10-CM

## 2020-04-29 NOTE — Telephone Encounter (Signed)
This information has been sent

## 2020-05-07 ENCOUNTER — Other Ambulatory Visit: Payer: Self-pay | Admitting: Family Medicine

## 2020-05-07 DIAGNOSIS — I10 Essential (primary) hypertension: Secondary | ICD-10-CM

## 2020-05-15 ENCOUNTER — Other Ambulatory Visit: Payer: Self-pay

## 2020-05-15 ENCOUNTER — Telehealth: Payer: Self-pay | Admitting: Neurology

## 2020-05-15 ENCOUNTER — Other Ambulatory Visit: Payer: Self-pay | Admitting: Neurology

## 2020-05-15 MED ORDER — CLOPIDOGREL BISULFATE 75 MG PO TABS
75.0000 mg | ORAL_TABLET | Freq: Every day | ORAL | 1 refills | Status: DC
Start: 2020-05-15 — End: 2020-11-13

## 2020-05-15 NOTE — Telephone Encounter (Signed)
Patient scheduled for 01/21/21 with Dr. Delice Lesch and added to wait list.  [facility] in [place]

## 2020-05-15 NOTE — Telephone Encounter (Signed)
Called and left a message to call back and schedule a follow up appointment with Dr. Delice Lesch. Last seen: 03/19/19 virtually.

## 2020-05-15 NOTE — Telephone Encounter (Signed)
Refill sent for pt

## 2020-05-24 ENCOUNTER — Other Ambulatory Visit: Payer: Self-pay | Admitting: Neurology

## 2020-05-25 ENCOUNTER — Other Ambulatory Visit: Payer: Self-pay | Admitting: Family Medicine

## 2020-06-08 ENCOUNTER — Ambulatory Visit (INDEPENDENT_AMBULATORY_CARE_PROVIDER_SITE_OTHER): Payer: Medicare Other | Admitting: Physician Assistant

## 2020-06-08 DIAGNOSIS — Z5329 Procedure and treatment not carried out because of patient's decision for other reasons: Secondary | ICD-10-CM

## 2020-06-08 DIAGNOSIS — E118 Type 2 diabetes mellitus with unspecified complications: Secondary | ICD-10-CM

## 2020-06-08 NOTE — Progress Notes (Signed)
No show

## 2020-07-13 ENCOUNTER — Telehealth: Payer: Self-pay | Admitting: Family Medicine

## 2020-07-13 NOTE — Telephone Encounter (Signed)
Daughter called.  She wants all Fred [name] meds called into the [place] from here on in.Thank you.

## 2020-07-14 NOTE — Telephone Encounter (Signed)
Updated chart.

## 2020-07-23 ENCOUNTER — Other Ambulatory Visit: Payer: Self-pay

## 2020-07-23 ENCOUNTER — Ambulatory Visit (INDEPENDENT_AMBULATORY_CARE_PROVIDER_SITE_OTHER): Payer: Medicare Other | Admitting: Family Medicine

## 2020-07-23 ENCOUNTER — Encounter: Payer: Self-pay | Admitting: Family Medicine

## 2020-07-23 VITALS — BP 135/61 | HR 53 | Ht 63.0 in

## 2020-07-23 DIAGNOSIS — I509 Heart failure, unspecified: Secondary | ICD-10-CM | POA: Diagnosis not present

## 2020-07-23 DIAGNOSIS — R21 Rash and other nonspecific skin eruption: Secondary | ICD-10-CM

## 2020-07-23 DIAGNOSIS — Z794 Long term (current) use of insulin: Secondary | ICD-10-CM | POA: Diagnosis not present

## 2020-07-23 DIAGNOSIS — I10 Essential (primary) hypertension: Secondary | ICD-10-CM | POA: Diagnosis not present

## 2020-07-23 DIAGNOSIS — E1169 Type 2 diabetes mellitus with other specified complication: Secondary | ICD-10-CM

## 2020-07-23 DIAGNOSIS — F4321 Adjustment disorder with depressed mood: Secondary | ICD-10-CM

## 2020-07-23 DIAGNOSIS — E118 Type 2 diabetes mellitus with unspecified complications: Secondary | ICD-10-CM

## 2020-07-23 DIAGNOSIS — E669 Obesity, unspecified: Secondary | ICD-10-CM

## 2020-07-23 LAB — POCT GLYCOSYLATED HEMOGLOBIN (HGB A1C): Hemoglobin A1C: 6.7 % — AB (ref 4.0–5.6)

## 2020-07-23 NOTE — Patient Instructions (Signed)
Decrease Lantus to 16 units at night.  If sugars are still dropping below 80 after one week please call me back.

## 2020-07-23 NOTE — Progress Notes (Signed)
v  Established Patient Office Visit  Subjective:  Patient ID: Fred [name], male    DOB: 10-05-1935  Age: 85 y.o. MRN: [id number]  CC:  Chief Complaint  Patient presents with  . Diabetes    HPI Fred [name] presents for   Hypertension- Pt denies chest pain, SOB, dizziness, or heart palpitations.  Taking meds as directed w/o problems.  Denies medication side effects.    Diabetes - no hypoglycemic events. No wounds or sores that are not healing well. No increased thirst or urination. Checking glucose at home. Taking medications as prescribed without any side effects.  His wife unfortunately had passed away since I last saw him.  He says he is doing okay and his daughter is here with him today and is very supportive.  He also has a caretaker with him today.  Also concerned because the tip of his penis is looks a little bit more red though he denies any pain or itching or irritation he has previously been wearing the condom cath at night but stopped wearing it because of the redness.   Past Medical History:  Diagnosis Date  . Atrial fibrillation (HCC)    Post operative  . CAD (coronary artery disease)    s/p cabg  . CHF (congestive heart failure) (HCC)   . Diverticulosis   . DJD (degenerative joint disease)   . DM (diabetes mellitus) (HCC)   . History of anemia   . History of stroke   . Hyperlipidemia   . Hypertension   . 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 Peripheral vascular disease (HCC)   . Polyneuropathy, diabetic (HCC)   . Seizure disorder, complex partial (HCC)   . Syncope and collapse     Past Surgical History:  Procedure Laterality Date  . CATARACT EXTRACTION W/ INTRAOCULAR LENS  IMPLANT, BILATERAL  06/2013   Dr. Jaquita Rector  . CORONARY ARTERY BYPASS GRAFT  02/13/2004   4 vesel  . removal of sebaceous cyst from neck  08/2007   Dr. Luisa Hart  . TONSILLECTOMY      Family History  Problem Relation Age of Onset  . Epilepsy Father    . Stroke Mother   . Lung cancer Other     Social History   Socioeconomic History  . Marital status: Married    Spouse name: Fred [name]  . Number of children: 3  . Years of education: Not on file  . Highest education level: Not on file  Occupational History  . 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 Smoking status: Former Smoker    Packs/day: 1.00    Years: 22.00    Pack years: 22.00    Types: Cigarettes    Quit date: 03/14/1977    Years since quitting: 43.3  . Smokeless tobacco: Never Used  Vaping Use  . Vaping Use: Never used  Substance and Sexual Activity  . Alcohol use: Not Currently    Comment: Pt has not had alcohol since 03/2017 CVA  . Drug use: No  . Sexual activity: Never  Other Topics Concern  . Not on file  Social History Narrative   3 caffeinated drinks per day. Mostly coffee. He walks 15 minutes 3 days per week. Quit smoking in 1978.      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    Social Determinants of Health   Financial Resource Strain: Not on file  Food Insecurity: Not on file  Transportation Needs: Not on file  Physical Activity: Not on file  Stress: Not on file  Social Connections: Not on file  Intimate Partner Violence: Not on file    Outpatient Medications Prior to Visit  Medication Sig Dispense Refill  . acetaminophen (TYLENOL) 325 MG tablet Take 2 tablets (650 mg total) by mouth every 4 (four) hours as needed for mild pain (or temp > 37.5 C (99.5 F)).    Marland Kitchen allopurinol (ZYLOPRIM) 300 MG tablet TAKE 1 TABLET BY MOUTH DAILY 90 tablet 1  . atorvastatin (LIPITOR) 80 MG tablet TAKE 1 TABLET(80 MG) BY MOUTH DAILY 90 tablet 3  . blood glucose meter kit and supplies Check blood sugar 3 times daily. 1 each 0  . Cholecalciferol (VITAMIN D) 50 MCG (2000 UT) tablet Take 1 tablet by mouth daily.    .  clopidogrel (PLAVIX) 75 MG tablet Take 1 tablet (75 mg total) by mouth daily. 90 tablet 1  . Coenzyme Q10 (COQ10) 100 MG CAPS Take 300 mg by mouth daily.    . divalproex (DEPAKOTE ER) 500 MG 24 hr tablet TAKE 1 TABLET(500 MG) BY MOUTH DAILY 90 tablet 2  . empagliflozin (JARDIANCE) 10 MG TABS tablet TAKE 1 TABLET(10 MG) BY MOUTH DAILY BEFORE BREAKFAST 90 tablet 1  . escitalopram (LEXAPRO) 5 MG tablet TAKE 1 TABLET(5 MG) BY MOUTH DAILY 90 tablet 1  . ezetimibe (ZETIA) 10 MG tablet TAKE 1 TABLET(10 MG) BY MOUTH DAILY 90 tablet 3  . insulin glargine, 1 Unit Dial, (TOUJEO SOLOSTAR) 300 UNIT/ML Solostar Pen Inject 22 Units into the skin at bedtime. 18 mL 3  . Lancets (ONETOUCH DELICA PLUS [id number]) MISC     . losartan (COZAAR) 25 MG tablet TAKE 1 TABLET(25 MG) BY MOUTH DAILY 90 tablet 0  . Magnesium Oxide 420 MG TABS Take 1 tablet by mouth daily.    . metoprolol succinate (TOPROL-XL) 50 MG 24 hr tablet TAKE 1 TABLET BY MOUTH DAILY IMMEDIATELY FOLLOWING A MEAL 90 tablet 0  . niacin (NIASPAN) 500 MG CR tablet TAKE 1 TABLET(500 MG) BY MOUTH AT BEDTIME 90 tablet 3  . NOVOFINE PEN NEEDLE 32G X 6 MM MISC INJECT INSULIN DAILY 100 each prn  . ONE TOUCH ULTRA TEST test strip     . senna-docusate (SENOKOT-S) 8.6-50 MG tablet Take 1 tablet by mouth 2 (two) times daily. (Patient taking differently: Take 1 tablet by mouth as needed for mild constipation.)    . TRADJENTA 5 MG TABS tablet TAKE 1 TABLET(5 MG) BY MOUTH DAILY 90 tablet 1   No facility-administered medications prior to visit.    Allergies  Allergen Reactions  . Lisinopril Cough    ROS Review of Systems    Objective:    Physical Exam Constitutional:      Appearance: He is well-developed.  HENT:     Head: Normocephalic and atraumatic.  Cardiovascular:     Rate and Rhythm: Normal rate and regular rhythm.     Heart sounds: Normal heart sounds.  Pulmonary:     Effort: Pulmonary effort is normal.     Breath sounds: Normal breath sounds.   Skin:    General: Skin is warm and dry.     Comments: Tip of penis is mildly erythematous.  Unable to retract the foreskin  Neurological:     Mental Status: He is  alert and oriented to person, place, and time.  Psychiatric:        Behavior: Behavior normal.     BP 135/61   Pulse (!) 53   Ht $R'5\' 3"'LF$  (1.6 m)   SpO2 98%   BMI 28.52 kg/m  Wt Readings from Last 3 Encounters:  03/26/19 161 lb (73 kg)  03/19/19 166 lb (75.3 kg)  12/08/18 170 lb (77.1 kg)     Health Maintenance Due  Topic Date Due  . OPHTHALMOLOGY EXAM  11/12/2019    There are no preventive care reminders to display for this patient.  Lab Results  Component Value Date   TSH 0.99 03/28/2018   Lab Results  Component Value Date   WBC 7.7 08/01/2019   HGB 13.3 08/01/2019   HCT 39.6 08/01/2019   MCV 93.2 08/01/2019   PLT 259 08/01/2019   Lab Results  Component Value Date   NA 141 07/23/2020   K 4.3 07/23/2020   CO2 28 07/23/2020   GLUCOSE 169 (H) 07/23/2020   BUN 15 07/23/2020   CREATININE 1.15 (H) 07/23/2020   BILITOT 0.4 07/23/2020   ALKPHOS 60 11/24/2018   AST 25 07/23/2020   ALT 24 07/23/2020   PROT 6.8 07/23/2020   ALBUMIN 3.3 (L) 11/24/2018   CALCIUM 9.3 07/23/2020   ANIONGAP 11 12/08/2018   GFR 84.99 05/16/2013   Lab Results  Component Value Date   CHOL 195 08/10/2017   Lab Results  Component Value Date   HDL 40 (L) 08/10/2017   Lab Results  Component Value Date   LDLCALC 130 (H) 08/10/2017   Lab Results  Component Value Date   TRIG 125 08/10/2017   Lab Results  Component Value Date   CHOLHDL 4.9 08/10/2017   Lab Results  Component Value Date   HGBA1C 6.7 (A) 07/23/2020      Assessment & Plan:   Problem List Items Addressed This Visit      Cardiovascular and Mediastinum   Essential hypertension    Well controlled. Continue current regimen. Follow up in  6 mo       Relevant Orders   COMPLETE METABOLIC PANEL WITH GFR (Completed)   CHF, chronic (HCC) - Primary      Endocrine   Type 2 diabetes mellitus with complication, with long-term current use of insulin (HCC)     Decrease Lantus to 16 units.  Follow-up in 3 to 4 months.  A1c looks phenomenal today at 6.7.      Relevant Orders   POCT glycosylated hemoglobin (Hb A1C) (Completed)   COMPLETE METABOLIC PANEL WITH GFR (Completed)    Other Visit Diagnoses    Rash of penis       Relevant Orders   WET PREP FOR [place], YEAST, CLUE (Completed)   Wound culture (Completed)   Diabetes mellitus type 2 in obese ([place])       Grief         Rash of penis -Swab performed to evaluate for possible yeast or infection.  No increase in discharge.  The skin does look a little irritated.  No orders of the defined types were placed in this encounter.   Follow-up: Return in about 4 months (around 11/23/2020) for Diabetes follow-up.    Beatrice Lecher, MD

## 2020-07-23 NOTE — Assessment & Plan Note (Signed)
Well controlled. Continue current regimen. Follow up in  6 mo  

## 2020-07-23 NOTE — Assessment & Plan Note (Signed)
Decrease Lantus to 16 units.  Follow-up in 3 to 4 months.  A1c looks phenomenal today at 6.7.

## 2020-07-24 LAB — COMPLETE METABOLIC PANEL WITH GFR
AG Ratio: 1.3 (calc) (ref 1.0–2.5)
ALT: 24 U/L (ref 9–46)
AST: 25 U/L (ref 10–35)
Albumin: 3.9 g/dL (ref 3.6–5.1)
Alkaline phosphatase (APISO): 62 U/L (ref 35–144)
BUN/Creatinine Ratio: 13 (calc) (ref 6–22)
BUN: 15 mg/dL (ref 7–25)
CO2: 28 mmol/L (ref 20–32)
Calcium: 9.3 mg/dL (ref 8.6–10.3)
Chloride: 105 mmol/L (ref 98–110)
Creat: 1.15 mg/dL — ABNORMAL HIGH (ref 0.70–1.11)
GFR, Est African American: 68 mL/min/{1.73_m2} (ref >=60)
GFR, Est Non African American: 59 mL/min/{1.73_m2} — ABNORMAL LOW (ref >=60)
Globulin: 2.9 g/dL (calc) (ref 1.9–3.7)
Glucose, Bld: 169 mg/dL — ABNORMAL HIGH (ref 65–99)
Potassium: 4.3 mmol/L (ref 3.5–5.3)
Sodium: 141 mmol/L (ref 135–146)
Total Bilirubin: 0.4 mg/dL (ref 0.2–1.2)
Total Protein: 6.8 g/dL (ref 6.1–8.1)

## 2020-07-26 LAB — WET PREP FOR TRICH, YEAST, CLUE
MICRO NUMBER:: 11885907
Specimen Quality: ADEQUATE

## 2020-07-26 LAB — WOUND CULTURE
MICRO NUMBER:: 11885908
SPECIMEN QUALITY:: ADEQUATE

## 2020-07-27 ENCOUNTER — Other Ambulatory Visit (HOSPITAL_COMMUNITY): Payer: Self-pay

## 2020-07-27 ENCOUNTER — Other Ambulatory Visit: Payer: Self-pay

## 2020-07-27 ENCOUNTER — Other Ambulatory Visit: Payer: Self-pay | Admitting: *Deleted

## 2020-07-27 DIAGNOSIS — E1169 Type 2 diabetes mellitus with other specified complication: Secondary | ICD-10-CM

## 2020-07-27 DIAGNOSIS — I10 Essential (primary) hypertension: Secondary | ICD-10-CM

## 2020-07-27 MED ORDER — METOPROLOL SUCCINATE ER 50 MG PO TB24: 50.0000 mg | ORAL_TABLET | Freq: Every day | ORAL | 1 refills | Status: DC

## 2020-07-27 MED ORDER — ALLOPURINOL 300 MG PO TABS: 300.0000 mg | ORAL_TABLET | Freq: Every day | ORAL | 1 refills | Status: AC

## 2020-07-27 MED ORDER — AMOXICILLIN 875 MG PO TABS
875.0000 mg | ORAL_TABLET | Freq: Two times a day (BID) | ORAL | 0 refills | Status: AC
  Filled 2020-07-27: qty 14, 7d supply, fill #0

## 2020-07-27 MED ORDER — TOUJEO SOLOSTAR 300 UNIT/ML ~~LOC~~ SOPN: 22.0000 [IU] | PEN_INJECTOR | Freq: Every day | SUBCUTANEOUS | 3 refills | Status: AC

## 2020-07-27 MED ORDER — LINAGLIPTIN 5 MG PO TABS: 5.0000 mg | ORAL_TABLET | Freq: Every day | ORAL | 1 refills | Status: DC

## 2020-07-27 MED ORDER — LINAGLIPTIN 5 MG PO TABS
5.0000 mg | ORAL_TABLET | Freq: Every day | ORAL | 0 refills | Status: AC
Start: 2020-07-27 — End: ?

## 2020-07-27 MED ORDER — EMPAGLIFLOZIN 10 MG PO TABS: ORAL_TABLET | ORAL | 1 refills | Status: DC

## 2020-07-27 MED ORDER — EZETIMIBE 10 MG PO TABS: 10.0000 mg | ORAL_TABLET | Freq: Every day | ORAL | 1 refills | Status: AC

## 2020-07-27 MED ORDER — ATORVASTATIN CALCIUM 80 MG PO TABS: 80.0000 mg | ORAL_TABLET | Freq: Every day | ORAL | 1 refills | Status: AC

## 2020-07-27 MED ORDER — LOSARTAN POTASSIUM 25 MG PO TABS: 25.0000 mg | ORAL_TABLET | Freq: Every day | ORAL | 1 refills | Status: DC

## 2020-07-27 MED ORDER — LOSARTAN POTASSIUM 25 MG PO TABS: 25.0000 mg | ORAL_TABLET | Freq: Every day | ORAL | 0 refills | Status: DC

## 2020-07-27 MED ORDER — ESCITALOPRAM OXALATE 5 MG PO TABS: 5.0000 mg | ORAL_TABLET | Freq: Every day | ORAL | 1 refills | Status: DC

## 2020-07-27 NOTE — Telephone Encounter (Signed)
Fred [name]'s daughter called and states she would like all his medications refilled and printed. She is going to take them to the [place]. I have pended prescriptions.

## 2020-07-27 NOTE — Addendum Note (Signed)
Addended by: Beatrice Lecher D on: 07/27/2020 08:37 AM   Modules accepted: Orders

## 2020-07-30 NOTE — Telephone Encounter (Signed)
Error

## 2020-08-21 ENCOUNTER — Ambulatory Visit: Admitting: Neurology

## 2020-10-23 ENCOUNTER — Other Ambulatory Visit: Payer: Self-pay | Admitting: Family Medicine

## 2020-11-13 ENCOUNTER — Other Ambulatory Visit: Payer: Self-pay

## 2020-11-13 ENCOUNTER — Inpatient Hospital Stay (HOSPITAL_COMMUNITY)
Admission: EM | Admit: 2020-11-13 | Discharge: 2020-12-01 | DRG: 064 | Disposition: A | Discharge status: Skilled Nursing Facility | Payer: Medicare Other | Attending: Internal Medicine | Admitting: Internal Medicine

## 2020-11-13 ENCOUNTER — Emergency Department (HOSPITAL_COMMUNITY): Payer: Medicare Other

## 2020-11-13 DIAGNOSIS — Z9842 Cataract extraction status, left eye: Secondary | ICD-10-CM

## 2020-11-13 DIAGNOSIS — I639 Cerebral infarction, unspecified: Secondary | ICD-10-CM | POA: Diagnosis not present

## 2020-11-13 DIAGNOSIS — Z951 Presence of aortocoronary bypass graft: Secondary | ICD-10-CM

## 2020-11-13 DIAGNOSIS — R29708 NIHSS score 8: Secondary | ICD-10-CM | POA: Diagnosis present

## 2020-11-13 DIAGNOSIS — I6523 Occlusion and stenosis of bilateral carotid arteries: Secondary | ICD-10-CM | POA: Diagnosis present

## 2020-11-13 DIAGNOSIS — Z961 Presence of intraocular lens: Secondary | ICD-10-CM | POA: Diagnosis present

## 2020-11-13 DIAGNOSIS — N401 Enlarged prostate with lower urinary tract symptoms: Secondary | ICD-10-CM | POA: Diagnosis present

## 2020-11-13 DIAGNOSIS — R9082 White matter disease, unspecified: Secondary | ICD-10-CM | POA: Diagnosis present

## 2020-11-13 DIAGNOSIS — Z823 Family history of stroke: Secondary | ICD-10-CM

## 2020-11-13 DIAGNOSIS — Z993 Dependence on wheelchair: Secondary | ICD-10-CM

## 2020-11-13 DIAGNOSIS — N179 Acute kidney failure, unspecified: Secondary | ICD-10-CM | POA: Diagnosis present

## 2020-11-13 DIAGNOSIS — E785 Hyperlipidemia, unspecified: Secondary | ICD-10-CM | POA: Diagnosis present

## 2020-11-13 DIAGNOSIS — Z820 Family history of epilepsy and other diseases of the nervous system: Secondary | ICD-10-CM

## 2020-11-13 DIAGNOSIS — F418 Other specified anxiety disorders: Secondary | ICD-10-CM | POA: Diagnosis present

## 2020-11-13 DIAGNOSIS — I6522 Occlusion and stenosis of left carotid artery: Secondary | ICD-10-CM

## 2020-11-13 DIAGNOSIS — I5032 Chronic diastolic (congestive) heart failure: Secondary | ICD-10-CM | POA: Diagnosis present

## 2020-11-13 DIAGNOSIS — R0602 Shortness of breath: Secondary | ICD-10-CM

## 2020-11-13 DIAGNOSIS — U071 COVID-19: Secondary | ICD-10-CM | POA: Diagnosis present

## 2020-11-13 DIAGNOSIS — I110 Hypertensive heart disease with heart failure: Secondary | ICD-10-CM | POA: Diagnosis present

## 2020-11-13 DIAGNOSIS — I6340 Cerebral infarction due to embolism of unspecified cerebral artery: Principal | ICD-10-CM | POA: Diagnosis present

## 2020-11-13 DIAGNOSIS — E1142 Type 2 diabetes mellitus with diabetic polyneuropathy: Secondary | ICD-10-CM | POA: Diagnosis present

## 2020-11-13 DIAGNOSIS — Z7901 Long term (current) use of anticoagulants: Secondary | ICD-10-CM

## 2020-11-13 DIAGNOSIS — Z7982 Long term (current) use of aspirin: Secondary | ICD-10-CM

## 2020-11-13 DIAGNOSIS — I69354 Hemiplegia and hemiparesis following cerebral infarction affecting left non-dominant side: Secondary | ICD-10-CM

## 2020-11-13 DIAGNOSIS — R338 Other retention of urine: Secondary | ICD-10-CM | POA: Diagnosis present

## 2020-11-13 DIAGNOSIS — I6503 Occlusion and stenosis of bilateral vertebral arteries: Secondary | ICD-10-CM | POA: Diagnosis present

## 2020-11-13 DIAGNOSIS — E118 Type 2 diabetes mellitus with unspecified complications: Secondary | ICD-10-CM

## 2020-11-13 DIAGNOSIS — Z6829 Body mass index (BMI) 29.0-29.9, adult: Secondary | ICD-10-CM

## 2020-11-13 DIAGNOSIS — E1159 Type 2 diabetes mellitus with other circulatory complications: Secondary | ICD-10-CM | POA: Diagnosis present

## 2020-11-13 DIAGNOSIS — I2510 Atherosclerotic heart disease of native coronary artery without angina pectoris: Secondary | ICD-10-CM | POA: Diagnosis present

## 2020-11-13 DIAGNOSIS — I6381 Other cerebral infarction due to occlusion or stenosis of small artery: Secondary | ICD-10-CM | POA: Diagnosis not present

## 2020-11-13 DIAGNOSIS — E663 Overweight: Secondary | ICD-10-CM | POA: Diagnosis present

## 2020-11-13 DIAGNOSIS — Z7902 Long term (current) use of antithrombotics/antiplatelets: Secondary | ICD-10-CM

## 2020-11-13 DIAGNOSIS — Z79899 Other long term (current) drug therapy: Secondary | ICD-10-CM

## 2020-11-13 DIAGNOSIS — D649 Anemia, unspecified: Secondary | ICD-10-CM | POA: Diagnosis present

## 2020-11-13 DIAGNOSIS — I152 Hypertension secondary to endocrine disorders: Secondary | ICD-10-CM | POA: Diagnosis present

## 2020-11-13 DIAGNOSIS — Z9841 Cataract extraction status, right eye: Secondary | ICD-10-CM

## 2020-11-13 DIAGNOSIS — Z87891 Personal history of nicotine dependence: Secondary | ICD-10-CM

## 2020-11-13 DIAGNOSIS — G40909 Epilepsy, unspecified, not intractable, without status epilepticus: Secondary | ICD-10-CM

## 2020-11-13 DIAGNOSIS — Z794 Long term (current) use of insulin: Secondary | ICD-10-CM

## 2020-11-13 DIAGNOSIS — G8191 Hemiplegia, unspecified affecting right dominant side: Secondary | ICD-10-CM | POA: Diagnosis present

## 2020-11-13 DIAGNOSIS — F32A Depression, unspecified: Secondary | ICD-10-CM | POA: Diagnosis present

## 2020-11-13 DIAGNOSIS — E1151 Type 2 diabetes mellitus with diabetic peripheral angiopathy without gangrene: Secondary | ICD-10-CM | POA: Diagnosis present

## 2020-11-13 DIAGNOSIS — G40209 Localization-related (focal) (partial) symptomatic epilepsy and epileptic syndromes with complex partial seizures, not intractable, without status epilepticus: Secondary | ICD-10-CM | POA: Diagnosis present

## 2020-11-13 DIAGNOSIS — F419 Anxiety disorder, unspecified: Secondary | ICD-10-CM | POA: Diagnosis present

## 2020-11-13 DIAGNOSIS — M7989 Other specified soft tissue disorders: Secondary | ICD-10-CM | POA: Diagnosis present

## 2020-11-13 DIAGNOSIS — I480 Paroxysmal atrial fibrillation: Secondary | ICD-10-CM | POA: Diagnosis present

## 2020-11-13 DIAGNOSIS — E1169 Type 2 diabetes mellitus with other specified complication: Secondary | ICD-10-CM | POA: Diagnosis present

## 2020-11-13 DIAGNOSIS — Z23 Encounter for immunization: Secondary | ICD-10-CM

## 2020-11-13 DIAGNOSIS — E1165 Type 2 diabetes mellitus with hyperglycemia: Secondary | ICD-10-CM | POA: Diagnosis present

## 2020-11-13 HISTORY — DX: Cerebral infarction, unspecified: I63.9

## 2020-11-13 LAB — COMPREHENSIVE METABOLIC PANEL
ALT: 20 U/L (ref 0–44)
AST: 21 U/L (ref 15–41)
Albumin: 3.2 g/dL — ABNORMAL LOW (ref 3.5–5.0)
Alkaline Phosphatase: 59 U/L (ref 38–126)
Anion gap: 8 (ref 5–15)
BUN: 16 mg/dL (ref 8–23)
CO2: 24 mmol/L (ref 22–32)
Calcium: 8.9 mg/dL (ref 8.9–10.3)
Chloride: 101 mmol/L (ref 98–111)
Creatinine, Ser: 1.3 mg/dL — ABNORMAL HIGH (ref 0.61–1.24)
GFR, Estimated: 55 mL/min — ABNORMAL LOW (ref >60)
Glucose, Bld: 237 mg/dL — ABNORMAL HIGH (ref 70–99)
Potassium: 4.7 mmol/L (ref 3.5–5.1)
Sodium: 133 mmol/L — ABNORMAL LOW (ref 135–145)
Total Bilirubin: 0.6 mg/dL (ref 0.3–1.2)
Total Protein: 6.5 g/dL (ref 6.5–8.1)

## 2020-11-13 LAB — I-STAT CHEM 8, ED
BUN: 17 mg/dL (ref 8–23)
Calcium, Ion: 1.22 mmol/L (ref 1.15–1.40)
Chloride: 102 mmol/L (ref 98–111)
Creatinine, Ser: 1.2 mg/dL (ref 0.61–1.24)
Glucose, Bld: 226 mg/dL — ABNORMAL HIGH (ref 70–99)
HCT: 43 % (ref 39.0–52.0)
Hemoglobin: 14.6 g/dL (ref 13.0–17.0)
Potassium: 4.7 mmol/L (ref 3.5–5.1)
Sodium: 136 mmol/L (ref 135–145)
TCO2: 25 mmol/L (ref 22–32)

## 2020-11-13 LAB — RESP PANEL BY RT-PCR (FLU A&B, COVID) ARPGX2
Influenza A by PCR: NEGATIVE
Influenza B by PCR: NEGATIVE
SARS Coronavirus 2 by RT PCR: NEGATIVE

## 2020-11-13 LAB — APTT: aPTT: 33 seconds (ref 24–36)

## 2020-11-13 LAB — CBC
HCT: 42.1 % (ref 39.0–52.0)
Hemoglobin: 13.7 g/dL (ref 13.0–17.0)
MCH: 32 pg (ref 26.0–34.0)
MCHC: 32.5 g/dL (ref 30.0–36.0)
MCV: 98.4 fL (ref 80.0–100.0)
Platelets: 190 10*3/uL (ref 150–400)
RBC: 4.28 MIL/uL (ref 4.22–5.81)
RDW: 14.5 % (ref 11.5–15.5)
WBC: 9.4 10*3/uL (ref 4.0–10.5)
nRBC: 0 % (ref 0.0–0.2)

## 2020-11-13 LAB — DIFFERENTIAL
Abs Immature Granulocytes: 0.05 10*3/uL (ref 0.00–0.07)
Basophils Absolute: 0.1 10*3/uL (ref 0.0–0.1)
Basophils Relative: 1 %
Eosinophils Absolute: 0.2 10*3/uL (ref 0.0–0.5)
Eosinophils Relative: 2 %
Immature Granulocytes: 1 %
Lymphocytes Relative: 13 %
Lymphs Abs: 1.2 10*3/uL (ref 0.7–4.0)
Monocytes Absolute: 0.9 10*3/uL (ref 0.1–1.0)
Monocytes Relative: 10 %
Neutro Abs: 7 10*3/uL (ref 1.7–7.7)
Neutrophils Relative %: 73 %

## 2020-11-13 LAB — PROTIME-INR
INR: 1.3 — ABNORMAL HIGH (ref 0.8–1.2)
Prothrombin Time: 16.2 seconds — ABNORMAL HIGH (ref 11.4–15.2)

## 2020-11-13 LAB — CBG MONITORING, ED: Glucose-Capillary: 222 mg/dL — ABNORMAL HIGH (ref 70–99)

## 2020-11-13 MED ORDER — DOXEPIN HCL 25 MG PO CAPS
25.0000 mg | ORAL_CAPSULE | Freq: Every day | ORAL | Status: DC
  Administered 2020-11-14 – 2020-11-27 (×15): 25 mg via ORAL
  Filled 2020-11-13 (×16): qty 1

## 2020-11-13 MED ORDER — STROKE: EARLY STAGES OF RECOVERY BOOK: Freq: Once | Status: DC

## 2020-11-13 MED ORDER — ASPIRIN EC 81 MG PO TBEC
81.0000 mg | DELAYED_RELEASE_TABLET | Freq: Every day | ORAL | Status: DC
  Administered 2020-11-14 – 2020-12-01 (×18): 81 mg via ORAL
  Filled 2020-11-13 (×18): qty 1

## 2020-11-13 MED ORDER — APIXABAN 5 MG PO TABS
5.0000 mg | ORAL_TABLET | Freq: Two times a day (BID) | ORAL | Status: DC
  Administered 2020-11-14 – 2020-11-15 (×4): 5 mg via ORAL
  Filled 2020-11-13 (×4): qty 1

## 2020-11-13 MED ORDER — INSULIN ASPART 100 UNIT/ML IJ SOLN
0.0000 [IU] | Freq: Three times a day (TID) | INTRAMUSCULAR | Status: DC
  Administered 2020-11-14: 1 [IU] via SUBCUTANEOUS
  Administered 2020-11-15: 3 [IU] via SUBCUTANEOUS
  Administered 2020-11-15: 2 [IU] via SUBCUTANEOUS
  Administered 2020-11-15: 1 [IU] via SUBCUTANEOUS
  Administered 2020-11-16 – 2020-11-17 (×5): 2 [IU] via SUBCUTANEOUS
  Administered 2020-11-18 (×3): 1 [IU] via SUBCUTANEOUS
  Administered 2020-11-19: 3 [IU] via SUBCUTANEOUS
  Administered 2020-11-19: 2 [IU] via SUBCUTANEOUS
  Administered 2020-11-20: 1 [IU] via SUBCUTANEOUS
  Administered 2020-11-20: 2 [IU] via SUBCUTANEOUS
  Administered 2020-11-22: 1 [IU] via SUBCUTANEOUS
  Administered 2020-11-22: 3 [IU] via SUBCUTANEOUS
  Administered 2020-11-23 (×3): 1 [IU] via SUBCUTANEOUS
  Administered 2020-11-24: 2 [IU] via SUBCUTANEOUS
  Administered 2020-11-26 – 2020-11-27 (×3): 1 [IU] via SUBCUTANEOUS

## 2020-11-13 MED ORDER — SODIUM CHLORIDE 0.9% FLUSH
3.0000 mL | Freq: Once | INTRAVENOUS | Status: AC
  Administered 2020-11-13: 3 mL via INTRAVENOUS

## 2020-11-13 MED ORDER — ACETAMINOPHEN 325 MG PO TABS
650.0000 mg | ORAL_TABLET | ORAL | Status: DC | PRN
  Administered 2020-11-21 – 2020-11-22 (×2): 650 mg via ORAL
  Filled 2020-11-13 (×3): qty 2

## 2020-11-13 MED ORDER — ACETAMINOPHEN 650 MG RE SUPP: 650.0000 mg | RECTAL | Status: DC | PRN

## 2020-11-13 MED ORDER — ATORVASTATIN CALCIUM 80 MG PO TABS
80.0000 mg | ORAL_TABLET | Freq: Every day | ORAL | Status: DC
  Administered 2020-11-14 – 2020-12-01 (×18): 80 mg via ORAL
  Filled 2020-11-13 (×18): qty 1

## 2020-11-13 MED ORDER — EZETIMIBE 10 MG PO TABS
10.0000 mg | ORAL_TABLET | Freq: Every day | ORAL | Status: DC
  Administered 2020-11-14 – 2020-12-01 (×18): 10 mg via ORAL
  Filled 2020-11-13 (×18): qty 1

## 2020-11-13 MED ORDER — INSULIN GLARGINE-YFGN 100 UNIT/ML ~~LOC~~ SOLN
22.0000 [IU] | Freq: Every day | SUBCUTANEOUS | Status: DC
  Administered 2020-11-14 – 2020-11-15 (×3): 22 [IU] via SUBCUTANEOUS
  Filled 2020-11-13 (×4): qty 0.22

## 2020-11-13 MED ORDER — SENNOSIDES-DOCUSATE SODIUM 8.6-50 MG PO TABS: 1.0000 | ORAL_TABLET | Freq: Every evening | ORAL | Status: DC | PRN

## 2020-11-13 MED ORDER — ESCITALOPRAM OXALATE 10 MG PO TABS
10.0000 mg | ORAL_TABLET | Freq: Every day | ORAL | Status: DC
  Administered 2020-11-14 – 2020-12-01 (×18): 10 mg via ORAL
  Filled 2020-11-13 (×18): qty 1

## 2020-11-13 MED ORDER — BACLOFEN 10 MG PO TABS
10.0000 mg | ORAL_TABLET | Freq: Two times a day (BID) | ORAL | Status: DC
  Administered 2020-11-14 – 2020-12-01 (×36): 10 mg via ORAL
  Filled 2020-11-13 (×36): qty 1

## 2020-11-13 MED ORDER — SODIUM CHLORIDE 0.9 % IV SOLN: INTRAVENOUS | Status: AC

## 2020-11-13 MED ORDER — ALLOPURINOL 300 MG PO TABS
300.0000 mg | ORAL_TABLET | Freq: Every day | ORAL | Status: DC
  Administered 2020-11-14 – 2020-12-01 (×18): 300 mg via ORAL
  Filled 2020-11-13 (×18): qty 1

## 2020-11-13 MED ORDER — DIVALPROEX SODIUM ER 500 MG PO TB24
500.0000 mg | ORAL_TABLET | Freq: Every day | ORAL | Status: DC
  Administered 2020-11-14 – 2020-11-30 (×18): 500 mg via ORAL
  Filled 2020-11-13 (×19): qty 1

## 2020-11-13 MED ORDER — ACETAMINOPHEN 160 MG/5ML PO SOLN: 650.0000 mg | ORAL | Status: DC | PRN

## 2020-11-13 NOTE — Code Documentation (Addendum)
Responded to Code Stroke called at [place] for R sided weakness, LSN originally 1830. Pt arrived at 26. Per EMS, pt was at a ball game with family and began to lean to his R side and  became confused. He has had a previous CVA resulting in L sided paralysis. CBG-222, NIH-8, CT head negative for acute changes. Per pt, his R side has been weak for >1 week, however, family concerned that symptoms just started. Pt taken to STAT MRI at 2002 showing punctate stroke in R frontal lobe. TNK not given-no stroke on MRI to explain symptoms. Plan for VS/neuro checks q2h X12, then Q4h.

## 2020-11-13 NOTE — Consult Note (Signed)
NEUROLOGY CONSULTATION NOTE   Date of service: November 13, 2020 Patient Name: Fred [name] MRN:  [id number] DOB:  05-17-1935 Reason for consult: "Stroke code for R sided weakness" Requesting Provider: Dorie Rank, MD _ _ _   _ __   _ __ _ _  __ __   _ __   __ _  History of Present Illness  Fred [name] is a 85 y.o. male with PMH significant for a single episode of postoperative atrial fibrillation, coronary artery disease, CHF, diabetes, hypertension, hyperlipidemia, prior lacunar stroke with residual plegia and left upper extremity and significant weakness in left lower extremity, history of peripheral vascular disease, history of seizure disorder on Depakote extended release 500 mg nightly who was brought in by EMS as a stroke code for right-sided weakness.  Patient reports that his right leg has been getting weaker and weaker for the last few months.  He feels that he has been using his right leg more more to compensate for his left leg weakness and that has been causing him to be weak on the right side.  Patient's daughter reports that he was doing fine today.  In the afternoon, he had some diarrhea.  He got in his scooter to go to the ballgame with them and they noted that he was tilting to his right and his speech was incoherent and he appeared to be mumbling.  Daughter reports that this started at 6:30 PM on 11/13/2020.  She was there with him when this started.  He denies any bowel or bladder incontinence, no Lhermitte sign, no saddle anesthesia.  mRS: 4.  Family provides 24/7 supervision and helps with meals, dressing and undressing, groceries, finances. tPA: the noted punctate stroke in the R frontal lobe do not explain his R leg weakness. Therefore, tPA was not offered. Thrombectomy: Low suspicion for LVO, therefore not offered. NIHSS components Score: Comment  1a Level of Conscious 0'[x]'$  1'[]'$  2'[]'$  3'[]'$      1b LOC Questions 0'[x]'$  1'[]'$  2'[]'$       1c LOC Commands 0'[x]'$  1'[]'$  2'[]'$       2  Best Gaze 0'[x]'$  1'[]'$  2'[]'$       3 Visual 0'[x]'$  1'[]'$  2'[]'$  3'[]'$      4 Facial Palsy 0'[x]'$  1'[]'$  2'[]'$  3'[]'$      5a Motor Arm - left 0'[]'$  1'[]'$  2'[]'$  3'[x]'$  4'[]'$  UN'[]'$    5b Motor Arm - Right 0'[x]'$  1'[]'$  2'[]'$  3'[]'$  4'[]'$  UN'[]'$    6a Motor Leg - Left 0'[]'$  1'[]'$  2'[x]'$  3'[]'$  4'[]'$  UN'[]'$    6b Motor Leg - Right 0'[]'$  1'[x]'$  2'[]'$  3'[]'$  4'[]'$  UN'[]'$    7 Limb Ataxia 0'[x]'$  1'[]'$  2'[]'$  3'[]'$  UN'[]'$     8 Sensory 0'[x]'$  1'[]'$  2'[]'$  UN'[]'$      9 Best Language 0'[x]'$  1'[]'$  2'[]'$  3'[]'$      10 Dysarthria 0'[x]'$  1'[]'$  2'[]'$  UN'[]'$      11 Extinct. and Inattention 0'[x]'$  1'[]'$  2'[]'$       TOTAL: 6      ROS   Constitutional Denies weight loss, fever and chills.   HEENT Denies changes in vision and hearing.   Respiratory Denies SOB and cough.   CV Denies palpitations and CP   GI Denies abdominal pain, nausea, vomiting and diarrhea.   GU Denies dysuria and urinary frequency.   MSK Denies myalgia and joint pain.   Skin Denies rash and pruritus.   Neurological Denies headache and syncope.   Psychiatric Denies recent changes in mood. Denies anxiety and depression.    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 [name]   CORONARY ARTERY BYPASS GRAFT  02/13/2004   4 vesel   removal of sebaceous cyst from neck  08/2007   Dr. 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 Mostly coffee. He walks 15 minutes 3 days per week. Quit smoking in 1978.      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    Social Determinants of Health   Financial Resource Strain: Not on file  Food Insecurity: Not on file  Transportation Needs: Not on file  Physical Activity: Not on file  Stress: Not on file  Social Connections: Not on file   Allergies  Allergen Reactions   Lisinopril Cough    Medications  (Not in a hospital admission)    Vitals   Vitals:   11/13/20 1900  Weight: 82.6 kg     Body mass index is 32.26 kg/m.  Physical Exam   General: Laying comfortably in bed; in no acute distress.  HENT: Normal oropharynx and mucosa. Normal external appearance of ears and nose.  Neck: Supple, no pain or tenderness  CV: No JVD. No peripheral edema. Pulmonary: Symmetric Chest rise. Normal respiratory effort.  Abdomen: Soft to touch, non-tender.  Ext: No cyanosis, edema, or deformity  Skin: No rash. Normal palpation of skin.   Musculoskeletal: Normal digits and nails by inspection. No clubbing.    Neurologic Examination  Mental status/Cognition: Alert, oriented to self, place, month and year, good attention.  Speech/language: Fluent, comprehension  intact, object naming intact, repetition intact.  Cranial nerves:   CN II Pupils equal and reactive to light, no VF deficits    CN III,IV,VI EOM intact, no gaze preference or deviation, no nystagmus    CN V normal sensation in V1, V2, and V3 segments bilaterally    CN VII no asymmetry, no nasolabial fold flattening    CN VIII normal hearing to speech    CN IX & X normal palatal elevation, no uvular deviation    CN XI 5/5 head turn and 5/5 shoulder shrug bilaterally    CN XII midline tongue protrusion    Motor:  Muscle bulk: poor, sepcially on the left, tone incraesed in LUE with contracture,  Mvmt Root Nerve  Muscle Right Left Comments  SA C5/6 Ax Deltoid 5 0   EF C5/6 Mc Biceps 5 0   EE C6/7/8 Rad Triceps 5 0   WF C6/7 Med FCR     WE C7/8 PIN ECU     F Ab C8/T1 U ADM/FDI 5 0   HF L1/2/3 Fem Illopsoas 4 1   KE L2/3/4 Fem Quad 4 0   DF L4/5 D Peron Tib Ant 3 0   PF S1/2 Tibial Grc/Sol 3 0    Reflexes:  Right Left Comments  Pectoralis      Biceps (C5/6) 2 2   Brachioradialis (C5/6) 2 3    Triceps (C6/7) 2 2    Patellar (L3/4) 2 2    Achilles (S1)      Hoffman      Plantar     Jaw jerk    Sensation:  Light touch intact   Pin prick    Temperature    Vibration   Proprioception    Coordination/Complex Motor:  - Finger to Nose intact on the right - Heel to shin unable to do - Rapid alternating movement are slowed all over - Gait: Unsafe to assess given his weakness.  Labs   CBC:  Recent Labs  Lab 11/13/20 1927 11/13/20 1933  WBC 9.4  --   NEUTROABS 7.0  --   HGB 13.7 14.6  HCT 42.1 43.0  MCV 98.4  --   PLT 190  --     Basic Metabolic Panel:  Lab Results  Component Value Date   NA 136 11/13/2020   K 4.7 11/13/2020   CO2 28 07/23/2020   GLUCOSE 226 (H) 11/13/2020   BUN 17 11/13/2020   CREATININE  1.20 11/13/2020   CALCIUM 9.3 07/23/2020   GFRNONAA 59 (L) 07/23/2020   GFRAA 68 07/23/2020   Lipid Panel:  Lab Results  Component Value Date   LDLCALC 130 (H) 08/10/2017   HgbA1c:  Lab Results  Component Value Date   HGBA1C 6.7 (A) 07/23/2020   Urine Drug Screen:     Component Value Date/Time   LABOPIA NONE DETECTED 08/10/2017 0001   COCAINSCRNUR NONE DETECTED 08/10/2017 0001   LABBENZ NONE DETECTED 08/10/2017 0001   AMPHETMU NONE DETECTED 08/10/2017 0001   THCU NONE DETECTED 08/10/2017 0001   LABBARB NONE DETECTED 08/10/2017 0001    Alcohol Level     Component Value Date/Time   ETH <10 08/09/2017 2328    CT Head without contrast(personally reviewed): CTH was negative for a large hypodensity concerning for a large territory infarct or hyperdensity concerning for an ICH  MRI Brain(personally reviewed): 2 punctate foci of acute ischemia within the subcortical right frontal lobe, 1 of which is along the right precentral gyrus.  Advanced chronic small vessel disease.   Impression   Fred [name] is a 85 y.o. male with PMH significant for a single episode of postoperative atrial fibrillation, coronary artery disease, CHF, diabetes, hypertension, hyperlipidemia, prior lacunar stroke with residual plegia and left upper extremity and significant weakness in left lower extremity, history of peripheral vascular disease, history of seizure disorder on Depakote extended release 500 mg nightly who was brought in by EMS as a stroke code for right-sided weakness.  LK W is unclear, per patient right leg has been progressively worsening over the last 6 months.  Per daughter, he just got weak at 1830 and leaning on his right with some mumbling.  On exam, he has baseline weakness in his left upper extremity and left lower extremity which is noted today.  In addition, he was noted to have right leg weakness.  Work-up with MRI brain without contrast demonstrates 2 subcortical right frontal  lobe infarcts which would not explain his right leg weakness.  He does not have any obvious signs of cord compression on exam.  Primary Diagnosis:  Other cerebral infarction due to occlusion of stenosis of small artery.  Secondary Diagnosis: Essential (primary) hypertension, Type 2 diabetes mellitus w/o complications, and Obesity  Recommendations  Plan:  Recommend that primary team order following: - Frequent Neuro checks per stroke unit protocol - Recommend brain imaging with MRI Brain without contrast - Recommend Vascular imaging with MRA Angio Head without contrast and US Carotid doppler - Recommend obtaining TTE - Recommend obtaining Lipid panel with LDL - Please start statin if LDL > 70 - Recommend HbA1c - Antithrombotic - Aspirin '81mg'$  daily along with plavix '75mg'$  daily x 21 days followed by Aspirin '81mg'$  daily alone. - Recommend DVT ppx - SBP goal - permissive hypertension first 24 h < 220/110. Held home meds.  - Recommend Telemetry monitoring for arrythmia - Recommend bedside swallow screen prior to PO intake. - Stroke education booklet - Recommend PT/OT/SLP consult   Discussed with Dr. Dorie Rank with the ED team. Plan also discussed with patient and his daughter at the bedside.  ______________________________________________________________________   Thank you for the opportunity to take part in the care of this patient. If you have any further questions, please contact the neurology consultation attending.  Signed,  [place] Pager Number [phone] _ _ _   _ __   _ __ _ _  __ __   _ __   __ _

## 2020-11-13 NOTE — ED Provider Notes (Signed)
[facility] EMERGENCY DEPARTMENT Provider Note   CSN: [id number] Arrival date & time: 11/13/20  1924  An emergency department physician performed an initial assessment on this suspected stroke patient at 63.  History Chief complaint weakness  Fred [name] is a 85 y.o. male.  HPI  Patient has history of prior stroke.  He presented to the ED for evaluation and increasing weakness that started several days ago.  Patient does have history of prior stroke affecting the left side.  His right side is generally strong.  Patient had noticed for the last several days that he was having increasing weakness.  Initially EMS was informed that the symptoms started just this evening so code stroke was activated in the field.  On further questioning with the patient in the ED he admitted that he had been having the symptoms for several days but his family just noticed it today.  Past Medical History:  Diagnosis Date   Atrial fibrillation 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    Peripheral vascular disease (HCC)    Polyneuropathy, diabetic ([place])    Seizure disorder, complex partial ([place])    Syncope and collapse     Patient Active Problem List   Diagnosis Date Noted   Urinary incontinence with continuous leakage 10/11/2019   Oropharyngeal dysphagia 08/02/2019   Fecal urgency 08/02/2019   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   Chronic diastolic (congestive) heart failure (HCC)    Hemiparesis affecting left side as late effect of cerebrovascular accident ([place])    Small vessel disease ([place]) 08/11/2017   Left hemiparesis ([place])    History of CVA  (cerebrovascular accident) 08/10/2017   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 of localized onset, not intractable, without status epilepticus ([place]) 06/20/2017   Primary osteoarthritis of both knees 05/11/2017   Stroke (cerebrum) ([place]) 03/21/2017   Type 2 diabetes mellitus with complication, with long-term current use of insulin ([place]) 03/27/2015   Atherosclerosis of native coronary artery of native heart without angina pectoris 03/27/2015   Palpitations 11/04/2014   S/P CABG (coronary artery bypass graft) 11/04/2014   Atrial fibrillation, unspecified    History of stroke 09/04/2014   Obesity (BMI 30-39.9) 11/29/2013   History of gout 05/16/2013   CHF, chronic ([place]) 04/20/2009   CARDIOVASCULAR FUNCTION STUDY, ABNORMAL 01/29/2009   ANEMIA / OTHER 05/16/2007   Coronary atherosclerosis 05/16/2007   PAROXYSMAL ATRIAL FIBRILLATION 05/16/2007   Subcortical infarction ([place]) 05/16/2007   Hyperlipidemia 03/30/2007   DEGENERATIVE JOINT DISEASE 03/30/2007   PAROTID LESION, UNSPECIFIED 09/29/2006   Partial epilepsy with impairment of consciousness ([place]) 09/29/2006   Diabetic polyneuropathy ([place]) 09/29/2006   Essential hypertension 09/29/2006   PERIPHERAL VASCULAR DISEASE 09/29/2006   DIVERTICULOSIS 09/29/2006    Past Surgical History:  Procedure Laterality Date   CATARACT EXTRACTION W/ INTRAOCULAR LENS  IMPLANT, BILATERAL  06/2013   Dr. [name]   CORONARY ARTERY BYPASS GRAFT  02/13/2004   4 vesel   removal of sebaceous cyst from neck  08/2007   Dr. [name]   TONSILLECTOMY  Family History  Problem Relation Age of Onset   Epilepsy Father    Stroke Mother    Lung cancer Other     Social History   Tobacco Use   Smoking status: Former    Packs/day: 1.00    Years: 22.00    Pack years: 22.00    Types: Cigarettes    Quit date: 03/14/1977    Years since quitting: 43.6   Smokeless tobacco: Never  Vaping Use   Vaping Use: Never  used  Substance Use Topics   Alcohol use: Not Currently    Comment: Pt has not had alcohol since 03/2017 CVA   Drug use: No    Home Medications Prior to Admission medications   Medication Sig Start Date End Date Taking? Authorizing Provider  acetaminophen (TYLENOL) 325 MG tablet Take 2 tablets (650 mg total) by mouth every 4 (four) hours as needed for mild pain (or temp > 37.5 C (99.5 F)). 09/13/17   Angiulli, Lavon Paganini, PA-C  allopurinol (ZYLOPRIM) 300 MG tablet Take 1 tablet (300 mg total) by mouth daily. 07/27/20   [name], MD  atorvastatin (LIPITOR) 80 MG tablet Take 1 tablet (80 mg total) by mouth daily. 07/27/20   [name], MD  blood glucose meter kit and supplies Check blood sugar 3 times daily. 04/12/18   [name], MD  Cholecalciferol (VITAMIN D) 50 MCG (2000 UT) tablet Take 1 tablet by mouth daily. 04/19/19   [provider]  clopidogrel (PLAVIX) 75 MG tablet Take 1 tablet (75 mg total) by mouth daily. 05/15/20   Cameron Sprang, MD  Coenzyme Q10 (COQ10) 100 MG CAPS Take 300 mg by mouth daily.    [provider]  divalproex (DEPAKOTE ER) 500 MG 24 hr tablet TAKE 1 TABLET(500 MG) BY MOUTH DAILY 05/25/20   Cameron Sprang, MD  escitalopram (LEXAPRO) 5 MG tablet Take 1 tablet (5 mg total) by mouth daily. 07/27/20   [name], MD  ezetimibe (ZETIA) 10 MG tablet Take 1 tablet (10 mg total) by mouth daily. 07/27/20   [name], MD  insulin glargine, 1 Unit Dial, (TOUJEO SOLOSTAR) 300 UNIT/ML Solostar Pen Inject 22 Units into the skin at bedtime. 07/27/20   [name], MD  JARDIANCE 10 MG TABS tablet TAKE 1 TABLET(10 MG) BY MOUTH DAILY BEFORE BREAKFAST 10/23/20   [name], MD  Lancets (ONETOUCH DELICA PLUS URKYHC62B) [place]  04/12/18   [provider]  linagliptin (TRADJENTA) 5 MG TABS tablet Take 1 tablet (5 mg total) by mouth daily. 07/27/20   [name], MD  losartan (COZAAR) 25 MG  tablet Take 1 tablet (25 mg total) by mouth daily. 07/27/20   [name], MD  Magnesium Oxide 420 MG TABS Take 1 tablet by mouth daily. 04/19/19   [provider]  metoprolol succinate (TOPROL-XL) 50 MG 24 hr tablet Take 1 tablet (50 mg total) by mouth daily. Take with or immediately following a meal. 07/27/20   [name], MD  niacin (NIASPAN) 500 MG CR tablet TAKE 1 TABLET(500 MG) BY MOUTH AT BEDTIME 12/02/19   [name], MD  NOVOFINE PEN NEEDLE 32G X 6 MM MISC INJECT INSULIN DAILY 02/07/20   [name], MD  ONE TOUCH ULTRA TEST test strip  04/12/18   [provider]  senna-docusate (SENOKOT-S) 8.6-50 MG tablet Take 1 tablet by mouth 2 (two) times daily. Patient taking differently: Take 1 tablet by  mouth as needed for mild constipation. 09/13/17   Angiulli, Lavon Paganini, PA-C    Allergies    Lisinopril  Review of Systems   Review of Systems  All other systems reviewed and are negative.  Physical Exam Updated Vital Signs BP 132/65 (BP Location: Right Arm)   Pulse 73   Temp 99.3 F (37.4 C)   Resp (!) 22   Ht 1.6 m ($Remo'5\' 3"'bmOqt$ )   Wt 74.8 kg   SpO2 95%   BMI 29.23 kg/m   Physical Exam Vitals and nursing note reviewed.  Constitutional:      General: He is not in acute distress.    Appearance: He is well-developed.  HENT:     Head: Normocephalic and atraumatic.     Right Ear: External ear normal.     Left Ear: External ear normal.  Eyes:     General: No scleral icterus.       Right eye: No discharge.        Left eye: No discharge.     Conjunctiva/sclera: Conjunctivae normal.  Neck:     Trachea: No tracheal deviation.  Cardiovascular:     Rate and Rhythm: Normal rate and regular rhythm.  Pulmonary:     Effort: Pulmonary effort is normal. No respiratory distress.     Breath sounds: Normal breath sounds. No stridor. No wheezing or rales.  Abdominal:     General: Bowel sounds are normal. There is no distension.      Palpations: Abdomen is soft.     Tenderness: There is no abdominal tenderness. There is no guarding or rebound.  Musculoskeletal:        General: No tenderness or deformity.     Cervical back: Neck supple.  Skin:    General: Skin is warm and dry.     Findings: No rash.  Neurological:     General: No focal deficit present.     Mental Status: He is alert.     Cranial Nerves: No cranial nerve deficit (no facial droop, extraocular movements intact, no slurred speech).     Sensory: No sensory deficit.     Motor: Weakness and abnormal muscle tone present. No seizure activity.     Coordination: Coordination normal.     Comments: Weakness left upper and left lower extremity, contracture of the left upper extremity; weakness noted on the right lower extremity, no slurred speech  Psychiatric:        Mood and Affect: Mood normal.    ED Results / Procedures / Treatments   Labs (all labs ordered are listed, but only abnormal results are displayed) Labs Reviewed  PROTIME-INR - Abnormal; Notable for the following components:      Result Value   Prothrombin Time 16.2 (*)    INR 1.3 (*)    All other components within normal limits  COMPREHENSIVE METABOLIC PANEL - Abnormal; Notable for the following components:   Sodium 133 (*)    Glucose, Bld 237 (*)    Creatinine, Ser 1.30 (*)    Albumin 3.2 (*)    GFR, Estimated 55 (*)    All other components within normal limits  I-STAT CHEM 8, ED - Abnormal; Notable for the following components:   Glucose, Bld 226 (*)    All other components within normal limits  CBG MONITORING, ED - Abnormal; Notable for the following components:   Glucose-Capillary 222 (*)    All other components within normal limits  RESP PANEL BY RT-PCR (FLU A&B, COVID)  ARPGX2  APTT  CBC  DIFFERENTIAL    EKG EKG Interpretation  Date/Time:  Friday November 13 2020 19:54:44 EDT Ventricular Rate:  73 PR Interval:  185 QRS Duration: 109 QT Interval:  387 QTC  Calculation: 427 R Axis:   -56 Text Interpretation: Sinus rhythm Left anterior fascicular block Abnormal R-wave progression, late transition Since last tracing rate faster Confirmed by Dorie Rank [phone]) on 11/13/2020 7:56:49 PM  Radiology MR BRAIN WO CONTRAST  Result Date: 11/13/2020 CLINICAL DATA:  Right-sided weakness EXAM: MRI HEAD WITHOUT CONTRAST TECHNIQUE: Multiplanar, multiecho pulse sequences of the brain and surrounding structures were obtained without intravenous contrast. COMPARISON:  None. FINDINGS: Brain: There are 2 punctate foci of abnormal diffusion restriction within the subcortical right frontal lobe, 1 of which is along the right precentral gyrus. There is diffuse, severe atrophy. There is advanced white matter disease most consistent with chronic small vessel ischemia. There is no acute or chronic hemorrhage. Vascular: Normal flow voids. Skull and upper cervical spine: Normal marrow signal. Sinuses/Orbits: Negative. Other: None. IMPRESSION: 1. Two punctate foci of acute ischemia within the subcortical right frontal lobe, 1 of which is along the right precentral gyrus. 2. No hemorrhage or mass effect. 3. Advanced white matter disease most consistent with chronic small vessel ischemia. Electronically Signed   By: Ulyses Jarred M.D.   On: 11/13/2020 20:52   CT HEAD CODE STROKE WO CONTRAST  Result Date: 11/13/2020 CLINICAL DATA:  Code stroke.  Acute neurologic deficit EXAM: CT HEAD WITHOUT CONTRAST TECHNIQUE: Contiguous axial images were obtained from the base of the skull through the vertex without intravenous contrast. COMPARISON:  None. FINDINGS: Brain: There is no mass, hemorrhage or extra-axial collection. There is generalized atrophy without lobar predilection. There is hypoattenuation of the periventricular white matter, most commonly indicating chronic ischemic microangiopathy. Vascular: Atherosclerotic calcification of the vertebral and internal carotid arteries at the skull base. No  abnormal hyperdensity of the major intracranial arteries or dural venous sinuses. Skull: The visualized skull base, calvarium and extracranial soft tissues are normal. Sinuses/Orbits: No fluid levels or advanced mucosal thickening of the visualized paranasal sinuses. No mastoid or middle ear effusion. The orbits are normal. ASPECTS [facility] Stroke Program Early CT Score) - Ganglionic level infarction (caudate, lentiform nuclei, internal capsule, insula, M1-M3 cortex): 7 - Supraganglionic infarction (M4-M6 cortex): 3 Total score (0-10 with 10 being normal): 10 IMPRESSION: 1. No acute intracranial abnormality. 2. ASPECTS is 10. 3. Chronic ischemic microangiopathy and generalized atrophy. These results were communicated to Dr. Donnetta Simpers at 7:42 pm on 11/13/2020 by text page via the [facility] messaging system. Electronically Signed   By: Ulyses Jarred M.D.   On: 11/13/2020 19:42    Procedures Procedures   Medications Ordered in ED Medications  sodium chloride flush (NS) 0.9 % injection 3 mL (3 mLs Intravenous Given 11/13/20 1955)    ED Course  I have reviewed the triage vital signs and the nursing notes.  Pertinent labs & imaging results that were available during my care of the patient were reviewed by me and considered in my medical decision making (see chart for details).    MDM Rules/Calculators/A&P                           Patient patient presented to the ED with findings concerning for an acute stroke.  Patient seen by Dr. Lorrin Goodell on arrival.  Patient is outside tPA window.  No indications for thrombolytics at this  time.  Presentation however is concerning for an acute stroke.  Labs are otherwise unremarkable.  No signs of anemia.  No signs of electrolyte abnormalities.  CT imaging does not show any acute findings.  Plan is for admission to the hospital for further stroke work-up and evaluation. Final Clinical Impression(s) / ED Diagnoses Final diagnoses:  Cerebrovascular accident (CVA),  unspecified mechanism ([place])     Dorie Rank, MD 11/13/20 2103

## 2020-11-13 NOTE — H&P (Addendum)
History and Physical    TYDEN KANN BOF:[id number] DOB: 01/04/1936 DOA: 11/13/2020  PCP: [name], MD  Patient coming from: Home via EMS  I have personally briefly reviewed patient's old medical records in [place]  Chief Complaint: Right-sided weakness  HPI: Fred [name] is a 85 y.o. male with medical history significant for history of CVA with residual left hemiplegia, wheelchair dependent, CAD s/p CABG, insulin-dependent T2DM, HTN, HLD, seizure disorder on Depakote, depression/anxiety, and single episode of postoperative atrial fibrillation who presented to the ED for evaluation of right-sided weakness.  History is supplemented by patient's daughter at bedside.  Patient initially reported right-sided weakness beginning 2 days ago to the emergency provider.  During my conversation he says his right side has been feeling weak from exertion from overuse due to his chronic left-sided hemiplegia.  His family took him to a ball game to watch his grandchildren play earlier today.  While sitting down he was noted to start drifting towards his right as if he was about to fall over.  He then developed slurred incoherent speech with transient confusion.  EMS were called and per daughter's report he was also noted to have pinpoint pupils.  At time of admitting evaluation, patient's daughter feels that slurred speech is improving but still noticeable compared to his baseline speech.  Mental status has also improved.  Patient was previously on dual antiplatelet therapy with aspirin 81 mg and Plavix 75 mg daily.  Per conversation, he was recently taken off of Plavix and started on Eliquis 5 mg twice daily on 10/28/2020 by his cardiologist through the [place].  This was apparently started for A. fib stroke reduction.  Per patient and family, they are only aware of a brief episode of postoperative A. fib at the time of his CABG.  He states that he has not had any recent outpatient  cardiac monitoring. Patient denies any personal history of blood clots.  ED Course:  Initial vitals show BP 132/65, pulse 72, RR 22, temp 99.3 F, SPO2 95% on room air.  Labs show sodium 133, potassium 4.7, bicarb 24, BUN 16, creatinine 1.30, serum glucose 237, LFTs within normal limits.  WBC 9.4, hemoglobin 13.7, platelets 190,000.  SARS-CoV-2 PCR negative.  Influenza negative.  CT head without contrast was negative for acute intracranial abnormality.  Chronic ischemic microangiopathy and generalized atrophy noted.  MRI brain without contrast showed 2 punctate foci of acute ischemia within the subcortical right frontal lobe, 1 of which is along the right precentral gyrus.  No hemorrhage or mass-effect seen.  Advanced white matter disease most consistent with chronic small vessel ischemia noted.  Neurology consulted and recommended medical admission for further stroke work-up.  The hospitalist service was consulted to admit for further management.  Review of Systems: All systems reviewed and are negative except as documented in history of present illness above.   Past Medical History:  Diagnosis Date   Atrial fibrillation 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 [name]   CORONARY ARTERY BYPASS GRAFT  02/13/2004   4 vesel   removal of sebaceous cyst from neck  08/2007   Dr. [name]   TONSILLECTOMY      Social History:   reports that he quit smoking about 43 years ago. His smoking use included cigarettes. He has a 22.00 pack-year smoking history. He has never used smokeless tobacco. He reports that he does not currently use alcohol. He reports that he does not use drugs.  Allergies  Allergen Reactions   Penicillins Anaphylaxis   Lisinopril Cough    Family History  Problem Relation Age of Onset   Epilepsy Father    Stroke Mother    Lung cancer Other      Prior to Admission medications   Medication Sig Start Date End Date Taking? Authorizing Provider  acetaminophen (TYLENOL) 325 MG tablet Take 2 tablets (650 mg total) by mouth every 4 (four) hours as needed for mild pain (or temp > 37.5 C (99.5 F)). 09/13/17   Angiulli, Lavon Paganini, PA-C  allopurinol (ZYLOPRIM) 300 MG tablet Take 1 tablet (300 mg total) by mouth daily. 07/27/20   [name], MD  atorvastatin (LIPITOR) 80 MG tablet Take 1 tablet (80 mg total) by mouth daily. 07/27/20   [name], MD  blood glucose meter kit and supplies Check blood sugar 3 times daily. 04/12/18   [name], MD  Cholecalciferol (VITAMIN D) 50 MCG (2000 UT) tablet Take 1 tablet by mouth daily. 04/19/19   [provider]  clopidogrel (PLAVIX) 75 MG tablet Take 1 tablet (75 mg total) by mouth daily. 05/15/20   Cameron Sprang, MD  Coenzyme Q10 (COQ10) 100 MG CAPS Take 300 mg by mouth daily.    [provider]  divalproex (DEPAKOTE ER) 500 MG 24 hr tablet TAKE 1 TABLET(500 MG) BY MOUTH DAILY 05/25/20   Cameron Sprang, MD  escitalopram (LEXAPRO) 5 MG tablet Take 1 tablet (5 mg total) by mouth daily. 07/27/20   [name], MD  ezetimibe (ZETIA) 10 MG tablet Take 1 tablet (10 mg total) by mouth daily. 07/27/20   [name], MD  insulin glargine, 1 Unit Dial, (TOUJEO SOLOSTAR) 300 UNIT/ML Solostar Pen Inject 22 Units into the skin at bedtime. 07/27/20   [name], MD  JARDIANCE 10 MG TABS tablet TAKE 1 TABLET(10  MG) BY MOUTH DAILY BEFORE BREAKFAST 10/23/20   [name], MD  Lancets (ONETOUCH DELICA PLUS [id number]) [place]  04/12/18   [provider]  linagliptin (TRADJENTA) 5 MG TABS tablet Take 1 tablet (5 mg total) by mouth daily. 07/27/20   [name], MD  losartan (COZAAR) 25 MG tablet Take 1 tablet (25 mg total) by mouth daily. 07/27/20   [name], MD  Magnesium Oxide 420 MG TABS Take 1 tablet by mouth daily. 04/19/19   [provider]  metoprolol succinate (TOPROL-XL) 50 MG 24 hr tablet Take 1 tablet (50 mg total) by mouth daily. Take with or immediately following a meal. 07/27/20   [name], MD  niacin (NIASPAN) 500 MG CR tablet TAKE 1 TABLET(500 MG) BY MOUTH AT BEDTIME 12/02/19   [name], MD  NOVOFINE PEN NEEDLE 32G X 6 MM MISC INJECT INSULIN DAILY 02/07/20   Beatrice Lecher  D, MD  ONE TOUCH ULTRA TEST test strip  04/12/18   [provider]  senna-docusate (SENOKOT-S) 8.6-50 MG tablet Take 1 tablet by mouth 2 (two) times daily. Patient taking differently: Take 1 tablet by mouth as needed for mild constipation. 09/13/17   Cathlyn Parsons, PA-C    Physical Exam: Vitals:   11/13/20 1900 11/13/20 1945 11/13/20 1956 11/13/20 2043  BP:   132/65   Pulse:   73   Resp:   (!) 22   Temp:  99.3 F (37.4 C)    SpO2:   95%   Weight: 82.6 kg   74.8 kg  Height:    '5\' 3"'$  (1.6 m)   Constitutional: Chronically ill-appearing elderly man resting in bed with head elevated.  NAD, calm, comfortable Eyes: PERRL, lids and conjunctivae normal ENMT: Mucous membranes are dry. Posterior pharynx clear of any exudate or lesions.Normal dentition.  Neck: normal, supple, no masses. Respiratory: clear to auscultation bilaterally, no wheezing, no crackles. Normal respiratory effort. No accessory muscle use.  Cardiovascular: Regular rate and rhythm, soft systolic murmur present.  +2 left lower extremity edema, no pitting edema on the right. 2+  pedal pulses. Abdomen: no tenderness, no masses palpated. No hepatosplenomegaly. Bowel sounds positive.  Musculoskeletal: no clubbing / cyanosis.  Contracture to left hand, ROM diminished left upper and lower extremities from prior stroke. Skin: no rashes, lesions, ulcers. No induration Neurologic: Slightly dysarthric speech otherwise CN 2-12 grossly intact. Sensation diminished left upper and lower extremities, intact on the right. Strength 5/5 in right upper and lower extremity.  Strength 1/5 left upper and lower extremity. Psychiatric: Awake, alert, oriented to self, place, but not year.  Normal mood.   Labs on Admission: I have personally reviewed following labs and imaging studies  CBC: Recent Labs  Lab 11/13/20 1927 11/13/20 1933  WBC 9.4  --   NEUTROABS 7.0  --   HGB 13.7 14.6  HCT 42.1 43.0  MCV 98.4  --   PLT 190  --    Basic Metabolic Panel: Recent Labs  Lab 11/13/20 1927 11/13/20 1933  NA 133* 136  K 4.7 4.7  CL 101 102  CO2 24  --   GLUCOSE 237* 226*  BUN 16 17  CREATININE 1.30* 1.20  CALCIUM 8.9  --    GFR: Estimated Creatinine Clearance: 42.3 mL/min (by C-G formula based on SCr of 1.2 mg/dL). Liver Function Tests: Recent Labs  Lab 11/13/20 1927  AST 21  ALT 20  ALKPHOS 59  BILITOT 0.6  PROT 6.5  ALBUMIN 3.2*   No results for input(s): LIPASE, AMYLASE in the last 168 hours. No results for input(s): AMMONIA in the last 168 hours. Coagulation Profile: Recent Labs  Lab 11/13/20 1927  INR 1.3*   Cardiac Enzymes: No results for input(s): CKTOTAL, CKMB, CKMBINDEX, TROPONINI in the last 168 hours. BNP (last 3 results) No results for input(s): PROBNP in the last 8760 hours. HbA1C: No results for input(s): HGBA1C in the last 72 hours. CBG: Recent Labs  Lab 11/13/20 1926  GLUCAP 222*   Lipid Profile: No results for input(s): CHOL, HDL, LDLCALC, TRIG, CHOLHDL, LDLDIRECT in the last 72 hours. Thyroid Function Tests: No results for input(s): TSH,  T4TOTAL, FREET4, T3FREE, THYROIDAB in the last 72 hours. Anemia Panel: No results for input(s): VITAMINB12, FOLATE, FERRITIN, TIBC, IRON, RETICCTPCT in the last 72 hours. Urine analysis:    Component Value Date/Time   COLORURINE YELLOW 12/08/2018 1713   APPEARANCEUR HAZY (A) 12/08/2018  1713   LABSPEC 1.016 12/08/2018 1713   PHURINE 6.0 12/08/2018 1713   GLUCOSEU NEGATIVE 12/08/2018 1713   GLUCOSEU NEGATIVE 05/16/2013 1009   HGBUR LARGE (A) 12/08/2018 1713   BILIRUBINUR NEGATIVE 12/08/2018 1713   BILIRUBINUR Negative 04/18/2018 1020   KETONESUR 20 (A) 12/08/2018 1713   PROTEINUR 100 (A) 12/08/2018 1713   UROBILINOGEN 1.0 04/18/2018 1020   UROBILINOGEN 0.2 05/16/2013 1009   NITRITE NEGATIVE 12/08/2018 1713   LEUKOCYTESUR MODERATE (A) 12/08/2018 1713    Radiological Exams on Admission: MR BRAIN WO CONTRAST  Result Date: 11/13/2020 CLINICAL DATA:  Right-sided weakness EXAM: MRI HEAD WITHOUT CONTRAST TECHNIQUE: Multiplanar, multiecho pulse sequences of the brain and surrounding structures were obtained without intravenous contrast. COMPARISON:  None. FINDINGS: Brain: There are 2 punctate foci of abnormal diffusion restriction within the subcortical right frontal lobe, 1 of which is along the right precentral gyrus. There is diffuse, severe atrophy. There is advanced white matter disease most consistent with chronic small vessel ischemia. There is no acute or chronic hemorrhage. Vascular: Normal flow voids. Skull and upper cervical spine: Normal marrow signal. Sinuses/Orbits: Negative. Other: None. IMPRESSION: 1. Two punctate foci of acute ischemia within the subcortical right frontal lobe, 1 of which is along the right precentral gyrus. 2. No hemorrhage or mass effect. 3. Advanced white matter disease most consistent with chronic small vessel ischemia. Electronically Signed   By: Ulyses Jarred M.D.   On: 11/13/2020 20:52   CT HEAD CODE STROKE WO CONTRAST  Result Date: 11/13/2020 CLINICAL DATA:   Code stroke.  Acute neurologic deficit EXAM: CT HEAD WITHOUT CONTRAST TECHNIQUE: Contiguous axial images were obtained from the base of the skull through the vertex without intravenous contrast. COMPARISON:  None. FINDINGS: Brain: There is no mass, hemorrhage or extra-axial collection. There is generalized atrophy without lobar predilection. There is hypoattenuation of the periventricular white matter, most commonly indicating chronic ischemic microangiopathy. Vascular: Atherosclerotic calcification of the vertebral and internal carotid arteries at the skull base. No abnormal hyperdensity of the major intracranial arteries or dural venous sinuses. Skull: The visualized skull base, calvarium and extracranial soft tissues are normal. Sinuses/Orbits: No fluid levels or advanced mucosal thickening of the visualized paranasal sinuses. No mastoid or middle ear effusion. The orbits are normal. ASPECTS [facility] Stroke Program Early CT Score) - Ganglionic level infarction (caudate, lentiform nuclei, internal capsule, insula, M1-M3 cortex): 7 - Supraganglionic infarction (M4-M6 cortex): 3 Total score (0-10 with 10 being normal): 10 IMPRESSION: 1. No acute intracranial abnormality. 2. ASPECTS is 10. 3. Chronic ischemic microangiopathy and generalized atrophy. These results were communicated to Dr. Donnetta Simpers at 7:42 pm on 11/13/2020 by text page via the [facility] messaging system. Electronically Signed   By: Ulyses Jarred M.D.   On: 11/13/2020 19:42    EKG: Personally reviewed. Normal sinus rhythm, LAFB, late R wave transition.  Tachycardia resolved otherwise not significantly changed when compared to prior.  Assessment/Plan Principal Problem:   Acute CVA (cerebrovascular accident) ([place]) Active Problems:   Hypertension associated with diabetes ([place])   Coronary atherosclerosis   Type 2 diabetes mellitus with complication, with long-term current use of insulin ([place])   AKI (acute kidney injury) ([place])    Hyperlipidemia associated with type 2 diabetes mellitus ([place])   Seizure disorder ([place])   Depression with anxiety   Fred [name] is a 85 y.o. male with medical history significant for history of CVA with residual left hemiplegia, wheelchair dependent, CAD s/p CABG, insulin-dependent T2DM, HTN, HLD, seizure disorder  on Depakote, depression/anxiety, and single episode of postoperative atrial fibrillation who is admitted with acute CVA.  Acute subcortical right frontal lobe ischemic infarcts History of prior CVA with residual left hemiplegia: -Stroke team following -Obtain MRA head -Carotid Dopplers -Echocardiogram -Continue home Eliquis 5 mg twice daily and aspirin 81 mg daily for now -Check A1c, lipid panel -Continue atorvastatin 80 mg daily -PT/OT/SLP eval -Monitor on telemetry, continue neurochecks -Allow permissive hypertension for now  Paroxysmal atrial fibrillation: Patient recently started on Eliquis 5 mg twice daily by his [place] cardiologist apparently for A. fib stroke risk reduction.  No prior A. fib episodes reported in our system other than a single episode in the postop setting after his CABG.  Sinus rhythm on admission with controlled rate. -Continue Eliquis 5 mg twice daily -Resume Toprol-XL when out of permissive hypertension window  Left lower extremity swelling: Likely dependent edema but will obtain venous ultrasound to assess for DVT.  Acute kidney injury: Mild, will place on gentle IV fluid hydration with NS$RemoveBeforeDE'@75'pLYHzFeTiyhCfNH$  mL/hour overnight.  Repeat labs in AM.  Hold losartan.  Hypertension: Antihypertensives on hold for now to allow permissive hypertension.  Insulin-dependent type 2 diabetes: Resume home Toujeo 22 units nightly, add SSI.  Holding home Jardiance and linagliptin.  Check A1c.  CAD s/p CABG: Stable, denies any chest pain.  Continue aspirin, atorvastatin, Zetia.  Seizure disorder: Continue Depakote.  Hyperlipidemia: Continue atorvastatin,  Zetia.  Depression/anxiety: Continue doxepin and Lexapro.  DVT prophylaxis: Eliquis Code Status: Full code, confirmed with patient Family Communication: Discussed with patient's daughter at bedside Disposition Plan: From home, dispo pending clinical progress Consults called: Neurology Level of care: Telemetry Medical Admission status:  Status is: Observation  The patient remains OBS appropriate and will d/c before 2 midnights.  Dispo: The patient is from: Home              Anticipated d/c is to:  Home versus SNF              Patient currently is not medically stable to d/c.  Zada Finders MD [facility]  If 7PM-7AM, please contact night-coverage [email or web address]  11/13/2020, 9:36 PM

## 2020-11-14 ENCOUNTER — Encounter (HOSPITAL_COMMUNITY): Payer: TRICARE For Life (TFL)

## 2020-11-14 ENCOUNTER — Observation Stay (HOSPITAL_COMMUNITY): Payer: Medicare Other

## 2020-11-14 ENCOUNTER — Observation Stay (HOSPITAL_BASED_OUTPATIENT_CLINIC_OR_DEPARTMENT_OTHER): Payer: Medicare Other

## 2020-11-14 DIAGNOSIS — Z7902 Long term (current) use of antithrombotics/antiplatelets: Secondary | ICD-10-CM | POA: Diagnosis not present

## 2020-11-14 DIAGNOSIS — E1169 Type 2 diabetes mellitus with other specified complication: Secondary | ICD-10-CM | POA: Diagnosis present

## 2020-11-14 DIAGNOSIS — R609 Edema, unspecified: Secondary | ICD-10-CM | POA: Diagnosis not present

## 2020-11-14 DIAGNOSIS — E785 Hyperlipidemia, unspecified: Secondary | ICD-10-CM

## 2020-11-14 DIAGNOSIS — G8191 Hemiplegia, unspecified affecting right dominant side: Secondary | ICD-10-CM | POA: Diagnosis present

## 2020-11-14 DIAGNOSIS — D649 Anemia, unspecified: Secondary | ICD-10-CM | POA: Diagnosis present

## 2020-11-14 DIAGNOSIS — I480 Paroxysmal atrial fibrillation: Secondary | ICD-10-CM | POA: Diagnosis present

## 2020-11-14 DIAGNOSIS — F32A Depression, unspecified: Secondary | ICD-10-CM | POA: Diagnosis present

## 2020-11-14 DIAGNOSIS — I6503 Occlusion and stenosis of bilateral vertebral arteries: Secondary | ICD-10-CM | POA: Diagnosis present

## 2020-11-14 DIAGNOSIS — G40909 Epilepsy, unspecified, not intractable, without status epilepticus: Secondary | ICD-10-CM

## 2020-11-14 DIAGNOSIS — Z23 Encounter for immunization: Secondary | ICD-10-CM | POA: Diagnosis present

## 2020-11-14 DIAGNOSIS — N179 Acute kidney failure, unspecified: Secondary | ICD-10-CM | POA: Diagnosis present

## 2020-11-14 DIAGNOSIS — E1165 Type 2 diabetes mellitus with hyperglycemia: Secondary | ICD-10-CM | POA: Diagnosis present

## 2020-11-14 DIAGNOSIS — E118 Type 2 diabetes mellitus with unspecified complications: Secondary | ICD-10-CM | POA: Diagnosis not present

## 2020-11-14 DIAGNOSIS — E1159 Type 2 diabetes mellitus with other circulatory complications: Secondary | ICD-10-CM

## 2020-11-14 DIAGNOSIS — R9082 White matter disease, unspecified: Secondary | ICD-10-CM | POA: Diagnosis present

## 2020-11-14 DIAGNOSIS — I6389 Other cerebral infarction: Secondary | ICD-10-CM

## 2020-11-14 DIAGNOSIS — I2510 Atherosclerotic heart disease of native coronary artery without angina pectoris: Secondary | ICD-10-CM | POA: Diagnosis not present

## 2020-11-14 DIAGNOSIS — Z951 Presence of aortocoronary bypass graft: Secondary | ICD-10-CM | POA: Diagnosis not present

## 2020-11-14 DIAGNOSIS — U071 COVID-19: Secondary | ICD-10-CM | POA: Diagnosis present

## 2020-11-14 DIAGNOSIS — I6523 Occlusion and stenosis of bilateral carotid arteries: Secondary | ICD-10-CM | POA: Diagnosis present

## 2020-11-14 DIAGNOSIS — I69354 Hemiplegia and hemiparesis following cerebral infarction affecting left non-dominant side: Secondary | ICD-10-CM | POA: Diagnosis not present

## 2020-11-14 DIAGNOSIS — Z794 Long term (current) use of insulin: Secondary | ICD-10-CM

## 2020-11-14 DIAGNOSIS — E1151 Type 2 diabetes mellitus with diabetic peripheral angiopathy without gangrene: Secondary | ICD-10-CM | POA: Diagnosis present

## 2020-11-14 DIAGNOSIS — E1142 Type 2 diabetes mellitus with diabetic polyneuropathy: Secondary | ICD-10-CM | POA: Diagnosis present

## 2020-11-14 DIAGNOSIS — I6340 Cerebral infarction due to embolism of unspecified cerebral artery: Secondary | ICD-10-CM | POA: Diagnosis present

## 2020-11-14 DIAGNOSIS — I5032 Chronic diastolic (congestive) heart failure: Secondary | ICD-10-CM | POA: Diagnosis present

## 2020-11-14 DIAGNOSIS — R29708 NIHSS score 8: Secondary | ICD-10-CM | POA: Diagnosis present

## 2020-11-14 DIAGNOSIS — I152 Hypertension secondary to endocrine disorders: Secondary | ICD-10-CM

## 2020-11-14 DIAGNOSIS — M7989 Other specified soft tissue disorders: Secondary | ICD-10-CM | POA: Diagnosis present

## 2020-11-14 DIAGNOSIS — I110 Hypertensive heart disease with heart failure: Secondary | ICD-10-CM | POA: Diagnosis present

## 2020-11-14 DIAGNOSIS — F418 Other specified anxiety disorders: Secondary | ICD-10-CM | POA: Diagnosis not present

## 2020-11-14 DIAGNOSIS — I639 Cerebral infarction, unspecified: Secondary | ICD-10-CM | POA: Diagnosis present

## 2020-11-14 DIAGNOSIS — G40209 Localization-related (focal) (partial) symptomatic epilepsy and epileptic syndromes with complex partial seizures, not intractable, without status epilepticus: Secondary | ICD-10-CM | POA: Diagnosis present

## 2020-11-14 LAB — ECHOCARDIOGRAM COMPLETE
AR max vel: 1.59 cm2
AV Area VTI: 1.58 cm2
AV Area mean vel: 1.56 cm2
AV Mean grad: 6 mmHg
AV Peak grad: 11.4 mmHg
Ao pk vel: 1.69 m/s
Area-P 1/2: 3.37 cm2
Calc EF: 60.2 %
Height: 63 in
S' Lateral: 2.6 cm
Single Plane A2C EF: 59.7 %
Single Plane A4C EF: 61.2 %
Weight: 2640 oz

## 2020-11-14 LAB — LIPID PANEL
Cholesterol: 98 mg/dL (ref 0–200)
HDL: 28 mg/dL — ABNORMAL LOW (ref >40)
LDL Cholesterol: 52 mg/dL (ref 0–99)
Total CHOL/HDL Ratio: 3.5 RATIO
Triglycerides: 92 mg/dL (ref <150)
VLDL: 18 mg/dL (ref 0–40)

## 2020-11-14 LAB — GLUCOSE, CAPILLARY
Glucose-Capillary: 135 mg/dL — ABNORMAL HIGH (ref 70–99)
Glucose-Capillary: 106 mg/dL — ABNORMAL HIGH (ref 70–99)
Glucose-Capillary: 244 mg/dL — ABNORMAL HIGH (ref 70–99)

## 2020-11-14 LAB — HEMOGLOBIN A1C
Hgb A1c MFr Bld: 7.8 % — ABNORMAL HIGH (ref 4.8–5.6)
Mean Plasma Glucose: 177.16 mg/dL

## 2020-11-14 MED ORDER — IOHEXOL 350 MG/ML SOLN
75.0000 mL | Freq: Once | INTRAVENOUS | Status: AC | PRN
  Administered 2020-11-14: 75 mL via INTRAVENOUS

## 2020-11-14 NOTE — Progress Notes (Signed)
Left lower extremity venous duplex completed. Refer to "CV Proc" under chart review to view preliminary results.  11/14/2020 11:27 AM Kelby Aline., MHA, RVT, RDCS, RDMS

## 2020-11-14 NOTE — Progress Notes (Signed)
Inpatient Rehab Admissions Coordinator:   Per therapy recommendations  patient was screened for CIR candidacy by Clemens Catholic, MS, CCC-SLP . At this time, Pt. Appears to demonstrate medical necessity, functional decline, and ability to tolerate intensity of CIR. Pt. is a potential candidate for CIR. I will place  order for rehab consult per protocol for full assessment. Please contact me any with questions.  Clemens Catholic, [place], [place] Admissions Coordinator  [phone] ([place]) [phone] (office)

## 2020-11-14 NOTE — Significant Event (Signed)
Rapid Response Event Note   Reason for Call :  Confusion  Initial Focused Assessment:  The patient's daughter noticed that when he woke up he was very confused and disoriented when he woke up. RN was notified and called rapid response. When I got to the room the patient was sleeping, but was arousable to voice. The patient had no focal deficits on my exam. With his previous stroke, he has left sided paralysis. He follows commands appropriately and is Aox3.   The NIH is 9, which is better than his previous NIH's. As per his daughter and granddaughter, he seems to be acting more at his baseline.  NIH 9  BP 122/67 HR 64 O2 99 (room air) RR 20   Plan of Care:  Patient will remain on 2W. Frequent neuro checks recommended. Discussed with daughters about stroke signs to look out for including vision change, change in speech, decreased sensory, and neglect.    Event Summary:    Call Time: [place] Time: 1410 End Time: [place], RN

## 2020-11-14 NOTE — Progress Notes (Signed)
EEG complete - results pending 

## 2020-11-14 NOTE — Progress Notes (Signed)
OT Cancellation Note  Patient Details Name: Fred [name] MRN: [id number] DOB: 1935/12/12   Cancelled Treatment:    Reason Eval/Treat Not Completed: Patient at procedure or test/ unavailable times two.  Now eith ECHO.  Continue efforts as appropriate.    Camden Mazzaferro D Hobert [place] 11/14/2020, 1:58 PM 11/14/2020  RP, OTR/L  Acute Rehabilitation Services  Office:  [phone]

## 2020-11-14 NOTE — Procedures (Signed)
History: 85 yo M With right sided weakness and confusion  Sedation: none  Technique: This EEG was acquired with electrodes placed according to the International 10-20 electrode system (including Fp1, Fp2, F3, F4, C3, C4, P3, P4, O1, O2, T3, T4, T5, T6, A1, A2, Fz, Cz, Pz). The following electrodes were missing or displaced: none.   Background: The background consists of intermixed generalized irregular delta and theta range activities.  There is no definite posterior dominant rhythm seen.  There are occasional brief runs of beta range activity consistent with spindles.  There are times where there is slightly faster underlying background, but no definite PDR.  Photic stimulation: Physiologic driving is not performed  EEG Abnormalities: 1) lack of waking recording  Clinical Interpretation: This EEG was recorded in the drowsy and sleeping state, the lack of waking state limits the ability to assess for encephalopathy.   There was no seizure or seizure predisposition recorded on this study. Please note that lack of epileptiform activity on EEG does not preclude the possibility of epilepsy.   Roland Rack, MD [facility] [phone]  If 7pm- 7am, please page neurology on call as listed in [place].

## 2020-11-14 NOTE — Progress Notes (Signed)
[facility] PROGRESS NOTE   Fred [name] [id number] DOB: [birth year] DOA: 11/13/2020  PCP: [name], MD  Brief History/Interval Summary: 85 y.o. male with medical history significant for history of CVA with residual left hemiplegia, wheelchair dependent, CAD s/p CABG, insulin-dependent T2DM, HTN, HLD, seizure disorder on Depakote, depression/anxiety, and single episode of postoperative atrial fibrillation who presented to the ED for evaluation of right-sided weakness.  Symptoms apparently began about 2 days prior to admission.  Patient was evaluated in the emergency department.  Underwent MRI which actually showed right-sided stroke.  Hospitalize for further management.  Patient was previously on aspirin and Plavix but recently taken off of Plavix and started on Eliquis by his cardiologist at the [facility].  This was for a diagnosis of atrial fibrillation.     Consultants: Neurology  Procedures: None  Antibiotics: Anti-infectives (From admission, onward)    None       Subjective/Interval History: Patient mentions that he is having some difficulty with his speech this morning.  Appears to be a little bit more slurred than usual.  Continues to have some right-sided weakness but better than yesterday.  Denies any headaches.  No nausea or vomiting.     Assessment/Plan:  Acute CVA MRI showed acute infarct in the right cerebral hemisphere.  Patient came in with right-sided weakness.  Patient has prior history of CVA with residual left-sided hemiplegia.  He also appears to have slurred speech this morning.  Neurology is following.  Echocardiogram is pending.  MRI head did not show any significant stenosis.  Patient has been continued on Eliquis and aspirin.  Noted to be on atorvastatin as well.  LDL noted to be 52.  HbA1c 7.8.  CT angiogram head and neck ordered by neurology.  PT OT speech therapy evaluation.  Stroke team to evaluate patient today.  Permissive  hypertension.  History of paroxysmal atrial fibrillation Recently started on Eliquis by his cardiologist at the [place].  This is being continued.  Noted to be on metoprolol at home which is currently on hold to allow permissive hypertension.  Left lower extremity edema Thought to be dependent edema but unable to rule out DVT.  Vascular studies pending.  Elevated creatinine Creatinine noted to be 1.3.  Repeat labs are pending.  Losartan is currently on hold.  Essential hypertension Permissive hypertension is being allowed.  Diabetes mellitus type 2, insulin-dependent Noted to be on Toujeo at home.  Currently on glargine insulin.  Monitor CBGs.  HbA1c is 7.8.  History of coronary artery disease status post CABG Stable.  Continue aspirin, statin, Zetia.  History of seizure disorder Continue Depakote.  Hyperlipidemia Continue statin and Zetia.  History of depression and anxiety Continue Lexapro doxepin.     DVT Prophylaxis: On Eliquis Code Status: Full code Family Communication: Discussed with the patient.  No family at bedside.  Will call daughter later today. Disposition Plan: To be determined  Status is: Observation  The patient will require care spanning > 2 midnights and should be moved to inpatient because: Ongoing diagnostic testing needed not appropriate for outpatient work up and Inpatient level of care appropriate due to severity of illness  Dispo: The patient is from: Home              Anticipated d/c is to:  To be determined              Patient currently is not medically stable to d/c.   Difficult to place patient No  Medications: Scheduled:   stroke: mapping our early stages of recovery book   Does not apply Once   allopurinol  300 mg Oral Daily   apixaban  5 mg Oral BID   aspirin EC  81 mg Oral Daily   atorvastatin  80 mg Oral Daily   baclofen  10 mg Oral BID   divalproex  500 mg Oral QHS   doxepin  25 mg Oral QHS   escitalopram  10 mg Oral Daily    ezetimibe  10 mg Oral Daily   insulin aspart  0-9 Units Subcutaneous TID WC   insulin glargine-yfgn  22 Units Subcutaneous QHS   Continuous: [id number] **OR** acetaminophen (TYLENOL) oral liquid 160 mg/5 mL **OR** acetaminophen, senna-docusate   Objective:  Vital Signs  Vitals:   11/14/20 0157 11/14/20 0158 11/14/20 0159 11/14/20 0631  BP: (!) 127/93   122/67  Pulse: 67   64  Resp:  15  20  Temp:   98 F (36.7 C) 98.4 F (36.9 C)  TempSrc:   Oral Oral  SpO2: 95%   99%  Weight:      Height:        Intake/Output Summary (Last 24 hours) at 11/14/2020 1046 Last data filed at 11/14/2020 0825 Gross per 24 hour  Intake --  Output 300 ml  Net -300 ml   Filed Weights   11/13/20 1900 11/13/20 2043  Weight: 82.6 kg 74.8 kg    General appearance: Awake alert.  In no distress Resp: Clear to auscultation bilaterally.  Normal effort Cardio: S1-S2 is normal regular.  No S3-S4.  No rubs murmurs or bruit GI: Abdomen is soft.  Nontender nondistended.  Bowel sounds are present normal.  No masses organomegaly Extremities: No edema.  Full range of motion of lower extremities. Neurologic: He is alert oriented x3.  He is got left hemiparesis.  Able to lift his right arm and able to keep it up for long period of time.  Able to move his right leg but weakness is appreciated.  Some slurred speech is noted.  No obvious facial asymmetry   Lab Results:  Data Reviewed: I have personally reviewed following labs and imaging studies  CBC: Recent Labs  Lab 11/13/20 1927 11/13/20 1933  WBC 9.4  --   NEUTROABS 7.0  --   HGB 13.7 14.6  HCT 42.1 43.0  MCV 98.4  --   PLT 190  --     Basic Metabolic Panel: Recent Labs  Lab 11/13/20 1927 11/13/20 1933  NA 133* 136  K 4.7 4.7  CL 101 102  CO2 24  --   GLUCOSE 237* 226*  BUN 16 17  CREATININE 1.30* 1.20  CALCIUM 8.9  --     GFR: Estimated Creatinine Clearance: 42.3 mL/min (by C-G formula based on SCr of 1.2 mg/dL).  Liver  Function Tests: Recent Labs  Lab 11/13/20 1927  AST 21  ALT 20  ALKPHOS 59  BILITOT 0.6  PROT 6.5  ALBUMIN 3.2*     Coagulation Profile: Recent Labs  Lab 11/13/20 1927  INR 1.3*     HbA1C: Recent Labs    11/14/20 0353  HGBA1C 7.8*    CBG: Recent Labs  Lab 11/13/20 1926 11/14/20 0827  GLUCAP 222* 135*    Lipid Profile: Recent Labs    11/14/20 0353  CHOL 98  HDL 28*  LDLCALC 52  TRIG 92  CHOLHDL 3.5      Recent Results (from the past 240 hour(s))  Resp Panel by RT-PCR (Flu A&B, Covid) Nasopharyngeal Swab     Status: None   Collection Time: 11/13/20  7:39 PM   Specimen: Nasopharyngeal Swab; Nasopharyngeal(NP) swabs in vial transport medium  Result Value Ref Range Status   SARS Coronavirus 2 by RT PCR NEGATIVE NEGATIVE Final    Comment: (NOTE) SARS-CoV-2 target nucleic acids are NOT DETECTED.  The SARS-CoV-2 RNA is generally detectable in upper respiratory specimens during the acute phase of infection. The lowest concentration of SARS-CoV-2 viral copies this assay can detect is 138 copies/mL. A negative result does not preclude SARS-Cov-2 infection and should not be used as the sole basis for treatment or other patient management decisions. A negative result may occur with  improper specimen collection/handling, submission of specimen other than nasopharyngeal swab, presence of viral mutation(s) within the areas targeted by this assay, and inadequate number of viral copies(<138 copies/mL). A negative result must be combined with clinical observations, patient history, and epidemiological information. The expected result is Negative.  Fact Sheet for Patients:  EntrepreneurPulse.com.au  Fact Sheet for Healthcare Providers:  IncredibleEmployment.be  This test is no t yet approved or cleared by the [place] FDA and  has been authorized for detection and/or diagnosis of SARS-CoV-2 by FDA under an Emergency  Use Authorization (EUA). This EUA will remain  in effect (meaning this test can be used) for the duration of the COVID-19 declaration under Section 564(b)(1) of the Act, 21 U.S.C.section 360bbb-3(b)(1), unless the authorization is terminated  or revoked sooner.       Influenza A by PCR NEGATIVE NEGATIVE Final   Influenza B by PCR NEGATIVE NEGATIVE Final    Comment: (NOTE) The Xpert Xpress SARS-CoV-2/FLU/RSV plus assay is intended as an aid in the diagnosis of influenza from Nasopharyngeal swab specimens and should not be used as a sole basis for treatment. Nasal washings and aspirates are unacceptable for Xpert Xpress SARS-CoV-2/FLU/RSV testing.  Fact Sheet for Patients: EntrepreneurPulse.com.au  Fact Sheet for Healthcare Providers: IncredibleEmployment.be  This test is not yet approved or cleared by the [place] FDA and has been authorized for detection and/or diagnosis of SARS-CoV-2 by FDA under an Emergency Use Authorization (EUA). This EUA will remain in effect (meaning this test can be used) for the duration of the COVID-19 declaration under Section 564(b)(1) of the Act, 21 U.S.C. section 360bbb-3(b)(1), unless the authorization is terminated or revoked.  Performed at [facility], [place] [address], [place], [place] [address]       Radiology Studies: MR ANGIO HEAD WO CONTRAST  Result Date: 11/14/2020 CLINICAL DATA:  Stroke follow-up EXAM: MRA HEAD WITHOUT CONTRAST TECHNIQUE: Angiographic images of the Circle of Willis were acquired using MRA technique without intravenous contrast. COMPARISON:  No pertinent prior exam. FINDINGS: POSTERIOR CIRCULATION: --Vertebral arteries: Normal --Inferior cerebellar arteries: Normal. --Basilar artery: Normal. --Superior cerebellar arteries: Normal. --Posterior cerebral arteries: Normal. ANTERIOR CIRCULATION: --Intracranial internal carotid arteries: Normal. --Anterior cerebral arteries (ACA):  Normal. --Middle cerebral arteries (MCA): Normal. ANATOMIC VARIANTS: None IMPRESSION: Normal intracranial MRA. Electronically Signed   By: Ulyses Jarred M.D.   On: 11/14/2020 01:54   MR BRAIN WO CONTRAST  Result Date: 11/13/2020 CLINICAL DATA:  Right-sided weakness EXAM: MRI HEAD WITHOUT CONTRAST TECHNIQUE: Multiplanar, multiecho pulse sequences of the brain and surrounding structures were obtained without intravenous contrast. COMPARISON:  None. FINDINGS: Brain: There are 2 punctate foci of abnormal diffusion restriction within the subcortical right frontal lobe, 1 of which is along the right precentral gyrus. There is  diffuse, severe atrophy. There is advanced white matter disease most consistent with chronic small vessel ischemia. There is no acute or chronic hemorrhage. Vascular: Normal flow voids. Skull and upper cervical spine: Normal marrow signal. Sinuses/Orbits: Negative. Other: None. IMPRESSION: 1. Two punctate foci of acute ischemia within the subcortical right frontal lobe, 1 of which is along the right precentral gyrus. 2. No hemorrhage or mass effect. 3. Advanced white matter disease most consistent with chronic small vessel ischemia. Electronically Signed   By: Ulyses Jarred M.D.   On: 11/13/2020 20:52   CT HEAD CODE STROKE WO CONTRAST  Result Date: 11/13/2020 CLINICAL DATA:  Code stroke.  Acute neurologic deficit EXAM: CT HEAD WITHOUT CONTRAST TECHNIQUE: Contiguous axial images were obtained from the base of the skull through the vertex without intravenous contrast. COMPARISON:  None. FINDINGS: Brain: There is no mass, hemorrhage or extra-axial collection. There is generalized atrophy without lobar predilection. There is hypoattenuation of the periventricular white matter, most commonly indicating chronic ischemic microangiopathy. Vascular: Atherosclerotic calcification of the vertebral and internal carotid arteries at the skull base. No abnormal hyperdensity of the major intracranial arteries  or dural venous sinuses. Skull: The visualized skull base, calvarium and extracranial soft tissues are normal. Sinuses/Orbits: No fluid levels or advanced mucosal thickening of the visualized paranasal sinuses. No mastoid or middle ear effusion. The orbits are normal. ASPECTS [facility] Stroke Program Early CT Score) - Ganglionic level infarction (caudate, lentiform nuclei, internal capsule, insula, M1-M3 cortex): 7 - Supraganglionic infarction (M4-M6 cortex): 3 Total score (0-10 with 10 being normal): 10 IMPRESSION: 1. No acute intracranial abnormality. 2. ASPECTS is 10. 3. Chronic ischemic microangiopathy and generalized atrophy. These results were communicated to Dr. Donnetta Simpers at 7:42 pm on 11/13/2020 by text page via the [facility] messaging system. Electronically Signed   By: Ulyses Jarred M.D.   On: 11/13/2020 19:42       LOS: 0 days   [facility] Pager on [email or web address]  11/14/2020, 10:46 AM

## 2020-11-14 NOTE — Progress Notes (Signed)
OT Cancellation Note  Patient Details Name: Fred [name] MRN: [id number] DOB: 04-25-1935   Cancelled Treatment:    Reason Eval/Treat Not Completed: Patient at procedure or test/ unavailable  Metta Clines 11/14/2020, 11:29 AM 11/14/2020  RP, OTR/L  Acute Rehabilitation Services  Office:  [phone]

## 2020-11-14 NOTE — Progress Notes (Signed)
Inpatient Rehab Admissions Coordinator:   I spoke with Pt. And daughter at bedside to discuss potential CIR admit. They are interested but only if Pt. Can go to SNF at a [place] facility afterwards.  There was some confusion about Pt.'s primary insurance, as his Medicare A/B policy appears to have changed to a [facility] Medicare advantage policy without the knowledge of Pt.or his HCPOA. Pt. Plans to transition to SNF after CIR, which he could not do with a [facility] medicare policy. I will work on sorting this out on my end and clarify if Pt's [employer] policy could be used for admission to a SNF within the [place] system after CIR.  Clemens Catholic, [place], [place] Admissions Coordinator  [phone] ([place]) [phone] (office)

## 2020-11-14 NOTE — Progress Notes (Signed)
STROKE TEAM PROGRESS NOTE   INTERVAL HISTORY His daughter and granddaughter is at the bedside.  They stated that the patient had a diarrhea earlier yesterday, started to have right-sided leaning well and wheelchair, however able to talk normally.  He went to the ballgame, again had right-sided leaning but then found to have slurred speech, garbled speech, BP 118/88 with EMS on lying on the stretcher.  Currently symptoms all resolved, and patient back to baseline.  MRI showed however right frontal parietal punctate infarcts x2. Patient does have left ICA 60 to 70% stenosis on CTA neck.  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    OBJECTIVE Vitals:   11/14/20 0157 11/14/20 0158 11/14/20 0159 11/14/20 0631  BP: (!) 127/93   122/67  Pulse: 67   64  Resp:  15  20  Temp:   98 F (36.7 C) 98.4 F (36.9 C)  TempSrc:   Oral Oral  SpO2: 95%   99%  Weight:      Height:        CBC:  Recent Labs  Lab 11/13/20 1927 11/13/20 1933  WBC 9.4  --   NEUTROABS 7.0  --   HGB 13.7 14.6  HCT 42.1 43.0  MCV 98.4  --   PLT 190  --     Basic Metabolic Panel:  Recent Labs  Lab 11/13/20 1927 11/13/20 1933  NA 133* 136  K 4.7 4.7  CL 101 102  CO2 24  --   GLUCOSE 237* 226*  BUN 16 17  CREATININE 1.30* 1.20  CALCIUM 8.9  --     Lipid Panel:     Component Value Date/Time   CHOL 98 11/14/2020 0353   TRIG 92 11/14/2020 0353   HDL 28 (L) 11/14/2020 0353   CHOLHDL 3.5 11/14/2020 0353   VLDL 18 11/14/2020 0353   LDLCALC 52 11/14/2020 0353   HgbA1c:  Lab Results  Component Value Date   HGBA1C 7.8 (H) 11/14/2020   Urine Drug Screen:     Component Value Date/Time   LABOPIA NONE DETECTED 08/10/2017 0001   COCAINSCRNUR NONE DETECTED 08/10/2017 0001   LABBENZ NONE DETECTED 08/10/2017 0001   AMPHETMU NONE DETECTED 08/10/2017 0001   THCU NONE DETECTED 08/10/2017 0001   LABBARB NONE DETECTED 08/10/2017 0001    Alcohol Level     Component Value  Date/Time   ETH <10 08/09/2017 2328    IMAGING  CT ANGIO NECK W OR WO CONTRAST Result Date: 11/14/2020 CLINICAL DATA:  Stroke workup EXAM: CT ANGIOGRAPHY NECK TECHNIQUE: Multidetector CT imaging of the neck was performed using the standard protocol during bolus administration of intravenous contrast. Multiplanar CT image reconstructions and MIPs were obtained to evaluate the vascular anatomy. Carotid stenosis measurements (when applicable) are obtained utilizing NASCET criteria, using the distal internal carotid diameter as the denominator. CONTRAST:  81m OMNIPAQUE IOHEXOL 350 MG/ML SOLN COMPARISON:  Brain MRI and MRA from yesterday and today respectively FINDINGS: Aortic arch: Atheromatous plaque.  Prior CABG. Right carotid system: Diffuse atherosclerosis that diffuse intermittent atheromatous plaque of the common carotid and proximal ICA. No flow limiting stenosis of 50% or greater. Moderate plaque irregularity without dissection or clear ulceration. Left carotid system: Atheromatous plaque diffusely involving the common carotid which is small compared to the right. Extensive plaque at the bifurcation and proximal ICA with proximal ICA stenosis measuring 60%. No dissection. ICA tortuosity with looping below the skull base. Vertebral arteries: No proximal subclavian stenosis of  hemodynamic significance. Calcified plaque at both vertebral origins with 60% stenosis on both sides as measured on coronal reformats. Negative for dissection or beading. Skeleton: Cervical spine degeneration with reversed lordosis and C4-5, C5-6 and mild anterolisthesis. Other neck: No acute finding Upper chest: Airway thickening IMPRESSION:  1. Cervical carotid atherosclerosis with 60% narrowing at the left ICA origin.  2. 60% bilateral vertebral origin stenosis.  Electronically Signed   By: Monte Fantasia M.D.   On: 11/14/2020 11:44   MR ANGIO HEAD WO CONTRAST Result Date: 11/14/2020 CLINICAL DATA:  Stroke follow-up EXAM: MRA  HEAD WITHOUT CONTRAST TECHNIQUE: Angiographic images of the Circle of Willis were acquired using MRA technique without intravenous contrast. COMPARISON:  No pertinent prior exam. FINDINGS: POSTERIOR CIRCULATION: --Vertebral arteries: Normal --Inferior cerebellar arteries: Normal. --Basilar artery: Normal. --Superior cerebellar arteries: Normal. --Posterior cerebral arteries: Normal. ANTERIOR CIRCULATION: --Intracranial internal carotid arteries: Normal. --Anterior cerebral arteries (ACA): Normal. --Middle cerebral arteries (MCA): Normal. ANATOMIC VARIANTS: None  IMPRESSION:  Normal intracranial MRA.  Electronically Signed   By: Ulyses Jarred M.D.   On: 11/14/2020 01:54   MR BRAIN WO CONTRAST Result Date: 11/13/2020 CLINICAL DATA:  Right-sided weakness EXAM: MRI HEAD WITHOUT CONTRAST TECHNIQUE: Multiplanar, multiecho pulse sequences of the brain and surrounding structures were obtained without intravenous contrast. COMPARISON:  None. FINDINGS: Brain: There are 2 punctate foci of abnormal diffusion restriction within the subcortical right frontal lobe, 1 of which is along the right precentral gyrus. There is diffuse, severe atrophy. There is advanced white matter disease most consistent with chronic small vessel ischemia. There is no acute or chronic hemorrhage. Vascular: Normal flow voids. Skull and upper cervical spine: Normal marrow signal. Sinuses/Orbits: Negative. Other: None.  IMPRESSION:  1. Two punctate foci of acute ischemia within the subcortical right frontal lobe, 1 of which is along the right precentral gyrus.  2. No hemorrhage or mass effect.  3. Advanced white matter disease most consistent with chronic small vessel ischemia.  Electronically Signed   By: Ulyses Jarred M.D.   On: 11/13/2020 20:52   EEG adult Result Date: 11/14/2020 Greta Doom, MD     11/14/2020  2:07 PM History: 85 yo M With right sided weakness and confusion Sedation: none Technique: This EEG was acquired with  electrodes placed according to the International 10-20 electrode system (including Fp1, Fp2, F3, F4, C3, C4, P3, P4, O1, O2, T3, T4, T5, T6, A1, A2, Fz, Cz, Pz). The following electrodes were missing or displaced: none. Background: The background consists of intermixed generalized irregular delta and theta range activities.  There is no definite posterior dominant rhythm seen.  There are occasional brief runs of beta range activity consistent with spindles.  There are times where there is slightly faster underlying background, but no definite PDR. Photic stimulation: Physiologic driving is not performed EEG Abnormalities: 1) lack of waking recording Clinical Interpretation: This EEG was recorded in the drowsy and sleeping state, the lack of waking state limits the ability to assess for encephalopathy. There was no seizure or seizure predisposition recorded on this study. Please note that lack of epileptiform activity on EEG does not preclude the possibility of epilepsy.  Roland Rack, MD [facility] [phone] If 7pm- 7am, please page neurology on call as listed in [place].   CT HEAD CODE STROKE WO CONTRAST Result Date: 11/13/2020 CLINICAL DATA:  Code stroke.  Acute neurologic deficit EXAM: CT HEAD WITHOUT CONTRAST TECHNIQUE: Contiguous axial images were obtained from the base of the skull through the  vertex without intravenous contrast. COMPARISON:  None. FINDINGS: Brain: There is no mass, hemorrhage or extra-axial collection. There is generalized atrophy without lobar predilection. There is hypoattenuation of the periventricular white matter, most commonly indicating chronic ischemic microangiopathy. Vascular: Atherosclerotic calcification of the vertebral and internal carotid arteries at the skull base. No abnormal hyperdensity of the major intracranial arteries or dural venous sinuses. Skull: The visualized skull base, calvarium and extracranial soft tissues are normal. Sinuses/Orbits: No  fluid levels or advanced mucosal thickening of the visualized paranasal sinuses. No mastoid or middle ear effusion. The orbits are normal. ASPECTS [facility] Stroke Program Early CT Score) - Ganglionic level infarction (caudate, lentiform nuclei, internal capsule, insula, M1-M3 cortex): 7 - Supraganglionic infarction (M4-M6 cortex): 3 Total score (0-10 with 10 being normal): 10  IMPRESSION:  1. No acute intracranial abnormality.  2. ASPECTS is 10.  3. Chronic ischemic microangiopathy and generalized atrophy.  These results were communicated to Dr. Donnetta Simpers at 7:42 pm on 11/13/2020 by text page via the [facility] messaging system.  Electronically Signed   By: Ulyses Jarred M.D.   On: 11/13/2020 19:42   VAS [place] LOWER EXTREMITY VENOUS (DVT) Result Date: 11/14/2020  Lower Venous DVT Study Patient Name:  SEMIH PLAUCHE [facility]  Date of Exam:   11/14/2020 Medical Rec #: [id number]         Accession #:    [id number] Date of Birth: [birth year]         Patient Gender: M Patient Age:   47 years Exam Location:  [facility] Procedure:      VAS [place] LOWER EXTREMITY VENOUS (DVT) Referring Phys: Roxanne Mins PATEL --------------------------------------------------------------------------------  Indications: Edema.  Limitations: Poor ultrasound/tissue interface, body habitus and restricted mobility. Comparison Study: No prior study Performing Technologist: Maudry Mayhew MHA, RDMS, RVT, RDCS  Examination Guidelines: A complete evaluation includes B-mode imaging, spectral Doppler, color Doppler, and power Doppler as needed of all accessible portions of each vessel. Bilateral testing is considered an integral part of a complete examination. Limited examinations for reoccurring indications may be performed as noted. The reflux portion of the exam is performed with the patient in reverse Trendelenburg.  +-----+---------------+---------+-----------+----------+--------------+  RIGHTCompressibilityPhasicitySpontaneityPropertiesThrombus Aging +-----+---------------+---------+-----------+----------+--------------+ CFV  Full           Yes      Yes                                 +-----+---------------+---------+-----------+----------+--------------+   +---------+---------------+---------+-----------+----------+--------------+ LEFT     CompressibilityPhasicitySpontaneityPropertiesThrombus Aging +---------+---------------+---------+-----------+----------+--------------+ CFV      Full           Yes      Yes                                 +---------+---------------+---------+-----------+----------+--------------+ SFJ      Full                                                        +---------+---------------+---------+-----------+----------+--------------+ FV Prox  Full                                                        +---------+---------------+---------+-----------+----------+--------------+  FV Mid   Full                                                        +---------+---------------+---------+-----------+----------+--------------+ FV DistalFull                                                        +---------+---------------+---------+-----------+----------+--------------+ PFV      Full                                                        +---------+---------------+---------+-----------+----------+--------------+ POP      Full           Yes      Yes                                 +---------+---------------+---------+-----------+----------+--------------+ PTV      Full                                                        +---------+---------------+---------+-----------+----------+--------------+ PERO     Full                                                        +---------+---------------+---------+-----------+----------+--------------+     Summary:  RIGHT: - No evidence of common femoral vein  obstruction.   LEFT: - There is no evidence of deep vein thrombosis in the lower extremity.  No cystic structure found in the popliteal fossa.   *See table(s) above for measurements and observations.  Electronically signed by Deitra Mayo MD on 11/14/2020 at 12:00:25 PM.    Final      ECG - SR rate 73 BPM. (See cardiology reading for complete details)   PHYSICAL EXAM  Temp:  [98 F (36.7 C)-98.4 F (36.9 C)] 98.4 F (36.9 C) (09/03 0631) Pulse Rate:  [64-70] 64 (09/03 0631) Resp:  [15-20] 20 (09/03 0631) BP: (106-127)/(67-93) 122/67 (09/03 0631) SpO2:  [95 %-99 %] 99 % (09/03 0631) Weight:  [74.8 kg] 74.8 kg (09/02 2043)  General - Well nourished, well developed, in no apparent distress.  Ophthalmologic - fundi not visualized due to noncooperation.  Cardiovascular - Regular rhythm and rate.  Neuro - awake, alert, eyes open, orientated to age, place and people but not to month. No aphasia, fluent language, following all simple commands, mild dysarthria. Able to name and repeat. No gaze palsy, tracking bilaterally, visual field full, PERRL. Left mild facial droop. Tongue midline. RUE 5/5. LUE with contracture of elbow and wrist and fingers in flexion position. RLE 3/5 proximal and 4/5 distally. LLE proximal 2-/5, distal  2/5. Sensation symmetrical bilaterally, right FTN intact, gait not tested.     ASSESSMENT/PLAN Mr. KHADYN GROHMAN is a 85 y.o. male with history of  single episode of postoperative atrial fibrillation, coronary artery disease, CHF, diabetes, hypertension, hyperlipidemia, prior lacunar stroke with residual plegia and left upper extremity and significant weakness in left lower extremity, history of peripheral vascular disease, and a history of seizure disorder who was brought in by EMS as a stroke code for right-sided weakness, speech had been incoherent and he had been mumbling.   ? TIA in the setting of left ICA stenosis and possible syncope / presyncope due to  dehydration Diarrhea earlier yesterday Daughter stated that patient has frequent urine and large urine output at baseline at home BP lower than normal even with lying down on the stretcher CTA head and neck showed left ICA 60 to 70% stenosis (comparable with 07/2017) Recommend BP monitoring at home Recommend to avoid dehydration  Incidental stroke: Two punctate foci of acute ischemia within the subcortical right frontal lobe - embolic pattern, source unclear CT Head - No acute intracranial abnormality. MRI head - Two punctate foci of acute ischemia within the subcortical right frontal lobe, 1 of which is along the right precentral gyrus. No hemorrhage or mass effect. Advanced white matter disease most consistent with chronic small vessel ischemia.  MRA head - Normal intracranial MRA. CTA Neck - Cervical carotid atherosclerosis with 60% narrowing at the left ICA origin. 60% bilateral vertebral origin stenosis.  EEG - There was no seizure or seizure predisposition recorded Bilateral LE venous Dopplers - no DVT 2D Echo - EF 60 - 65%. No cardiac source of emboli identified.  [employer] Virus 2 - negative LDL - 52 HgbA1c - 7.8 VTE prophylaxis - Eliquis aspirin 81 mg daily and Eliquis (apixaban) daily prior to admission, now on aspirin 81 mg daily and Eliquis (apixaban) daily, okay to continue on discharge Patient will be counseled to be compliant with his antithrombotic medications Ongoing aggressive stroke risk factor management Therapy recommendations: CIR Disposition:  Pending  History of stroke 08/2014 right frontal small infarct, and old thalamic infarct.  Carotid Doppler negative.  A1c 7.0, EF 45 to 50%, TCD emboli detection negative, 30-day cardiac event monitor showed no A. fib, discharged on aspirin 03/2017 admitted for confusion and speech difficulty, status post tPA, CT head and neck negative.  MRI negative for acute infarct.  Concerning for seizure versus TIA at the time. 07/2017  admitted for left facial droop and slurred speech as well as left-sided weakness.  CT no acute abnormality.  CT head and neck showed left ICA 70% stenosis.  MRI showed right PLIC infarct.  2D echo EF normal range, LDL 130, A1c 6.0, patient discharged with DAPT and Lipitor 80. Baseline left hemiplegia with contracture, [facility]  Anticoagulation Postop A. Fib Patient history of CABG followed by postop A. fib, but no evidence of recurrence.  08/2014 30-day cardiac event monitoring showed no A. fib. In 10/2020 patient follow with [place] clinic, found to have LE edema, given hx of afib, pt was started on Eliquis.  However later on LE venous Doppler no DVT Current admission LE venous Doppler no DVT Given the new incidental right MCA punctate infarcts, okay to continue Eliquis and aspirin at this time.  Seizure History of questionable seizure in 2005 and 2016 Strong family history of seizure Put on Depakote by PCP in 2016 Follow with Dr. Delice Lesch at [place]  Carotid stenosis CTA  head and neck in 07/2017 showed left ICA 70% stenosis This admission left ICA 60 to 70% stenosis on CTA head and neck May related to current symptoms in the setting of dehydration Recommend outpatient follow-up with vascular surgery for continued monitoring  Hypertension Home BP meds: Cozaar ; Toprol Stable now Avoid low BP Long-term BP goal 130-150 given carotid stenosis  Hyperlipidemia Home Lipid lowering medication: Lipitor 80 mg daily ; Zetia ; Niaspan LDL 52, goal < 70 Current lipid lowering medication: Lipitor 80 mg daily and Zetia Continue statin at discharge  Diabetes Home diabetic meds: insulin ; Tradjenta ; Jaediance Current diabetic meds: insulin HgbA1c 7.8, goal < 7.0 SSI CBG monitoring Close PCP follow-up for better DM control  Other Stroke Risk Factors Advanced age Former cigarette smoker - quit Family hx stroke (mother)  Coronary artery disease status post CABG PVD  Other  Active Problems Code status - Full code   Hospital day # 0  I spent  35 minutes in total face-to-face time with the patient, more than 50% of which was spent in counseling and coordination of care, reviewing test results, images and medication, and discussing the diagnosis, treatment plan and potential prognosis. This patient's care requiresreview of multiple databases, neurological assessment, discussion with family, other specialists and medical decision making of high complexity. I had long discussion with daughter and granddaughter at bedside, updated pt current condition, treatment plan and potential prognosis, and answered all the questions.  They expressed understanding and appreciation.   Rosalin Hawking, MD PhD Stroke Neurology 11/14/2020 8:56 PM      To contact Stroke Continuity provider, please refer to [email or web address] After hours, contact General Neurology

## 2020-11-14 NOTE — Evaluation (Signed)
Physical Therapy Evaluation Patient Details Name: Fred [name] MRN: [id number] DOB: 14-Jun-1935 Today's Date: 11/14/2020   History of Present Illness  Pt is an 85 y/o male admitted 9/2 secondary to new onset R weakness. Found to have R frontal lobe infarct. PMH includes HTN, DM, seizures, a fib, CVA with L sided weakness, CHF, and CAD.  Clinical Impression  Pt admitted secondary to problem above with deficits below. Cognitive deficits noted throughout, but unsure of pt's baseline. Requiring mod A for bed mobility and mod to max A to stand X3 this session. Required manual blocking of L knee. Pt reports he is normally able to transfer to/from [facility] independently. Recommending CIR level therapies to increase independence and safety with functional mobility. Will continue to follow acutely.     Follow Up Recommendations CIR    Equipment Recommendations  None recommended by PT    Recommendations for Other Services       Precautions / Restrictions Precautions Precautions: Fall Restrictions Weight Bearing Restrictions: No      Mobility  Bed Mobility Overal bed mobility: Needs Assistance Bed Mobility: Supine to Sit;Sit to Supine     Supine to sit: Mod assist Sit to supine: Mod assist   General bed mobility comments: Mod A for trunk assist and LLE assist.    Transfers Overall transfer level: Needs assistance Equipment used: None Transfers: Sit to/from Stand Sit to Stand: Mod assist;Max assist         General transfer comment: Performed standing X3. Mod to max A for lift assist and steadying. Required manual blocking assist for L knee.  Ambulation/Gait                Stairs            Wheelchair Mobility    Modified Rankin (Stroke Patients Only)       Balance Overall balance assessment: Needs assistance Sitting-balance support: Single extremity supported;Feet supported Sitting balance-Leahy Scale: Poor Sitting balance - Comments: Reliant on RUE support  to maintain sitting balance.   Standing balance support: Single extremity supported Standing balance-Leahy Scale: Poor Standing balance comment: Reliant on RUE and mod to max A to maintain standing                             Pertinent Vitals/Pain Pain Assessment: No/denies pain    Home Living Family/patient expects to be discharged to:: Private residence Living Arrangements: Alone Available Help at Discharge: Family Type of Home: House Home Access: Ramped entrance     Home Layout: Two level;Able to live on main level with bedroom/bathroom Home Equipment: Wheelchair - power;Shower seat - built in Additional Comments: Reports grandson stays with him at night    Prior Function Level of Independence: Needs assistance   Gait / Transfers Assistance Needed: Reports independence with transfers to/from [facility]. Reports some falls  ADL's / Homemaking Assistance Needed: Reports he needs assist with shower transfers.        Hand Dominance        Extremity/Trunk Assessment   Upper Extremity Assessment Upper Extremity Assessment: Defer to OT evaluation (LUE weakness at baseline)    Lower Extremity Assessment Lower Extremity Assessment: LLE deficits/detail;RLE deficits/detail RLE Deficits / Details: Able to perform SLR. Notable functional weakness. LLE Deficits / Details: No AROM noted in LLE at quads    Cervical / Trunk Assessment Cervical / Trunk Assessment: Kyphotic  Communication   Communication: No difficulties  Cognition Arousal/Alertness: Awake/alert  Behavior During Therapy: WFL for tasks assessed/performed Overall Cognitive Status: No family/caregiver present to determine baseline cognitive functioning                                 General Comments: Notable memory deficits. Pt thought he was at home.      General Comments      Exercises     Assessment/Plan    PT Assessment Patient needs continued PT services  PT Problem List  Decreased strength;Decreased balance;Decreased mobility;Decreased cognition;Decreased knowledge of use of DME;Decreased safety awareness;Decreased knowledge of precautions       PT Treatment Interventions DME instruction;Therapeutic activities;Functional mobility training;Therapeutic exercise;Balance training;Patient/family education;Cognitive remediation;Neuromuscular re-education    PT Goals (Current goals can be found in the Care Plan section)  Acute Rehab PT Goals Patient Stated Goal: to go home PT Goal Formulation: With patient Time For Goal Achievement: 11/28/20 Potential to Achieve Goals: Good    Frequency Min 4X/week   Barriers to discharge        Co-evaluation               AM-PAC PT "6 Clicks" Mobility  Outcome Measure Help needed turning from your back to your side while in a flat bed without using bedrails?: A Lot Help needed moving from lying on your back to sitting on the side of a flat bed without using bedrails?: A Lot Help needed moving to and from a bed to a chair (including a wheelchair)?: Total Help needed standing up from a chair using your arms (e.g., wheelchair or bedside chair)?: A Lot Help needed to walk in hospital room?: Total Help needed climbing 3-5 steps with a railing? : Total 6 Click Score: 9    End of Session   Activity Tolerance: Patient tolerated treatment well Patient left: in bed;with call bell/phone within reach;with bed alarm set Nurse Communication: Mobility status PT Visit Diagnosis: Unsteadiness on feet (R26.81);Muscle weakness (generalized) (M62.81);Difficulty in walking, not elsewhere classified (R26.2)    Time: [id number] PT Time Calculation (min) (ACUTE ONLY): 16 min   Charges:   PT Evaluation $PT Eval Moderate Complexity: 1 Mod          Reuel Derby, PT, DPT  Acute Rehabilitation Services  Pager: [phone] Office: [phone]   Rudean Hitt 11/14/2020, 9:28 AM

## 2020-11-15 DIAGNOSIS — E1159 Type 2 diabetes mellitus with other circulatory complications: Secondary | ICD-10-CM | POA: Diagnosis not present

## 2020-11-15 DIAGNOSIS — I152 Hypertension secondary to endocrine disorders: Secondary | ICD-10-CM | POA: Diagnosis not present

## 2020-11-15 DIAGNOSIS — I2510 Atherosclerotic heart disease of native coronary artery without angina pectoris: Secondary | ICD-10-CM | POA: Diagnosis not present

## 2020-11-15 DIAGNOSIS — E118 Type 2 diabetes mellitus with unspecified complications: Secondary | ICD-10-CM | POA: Diagnosis not present

## 2020-11-15 DIAGNOSIS — N179 Acute kidney failure, unspecified: Secondary | ICD-10-CM

## 2020-11-15 DIAGNOSIS — I639 Cerebral infarction, unspecified: Secondary | ICD-10-CM | POA: Diagnosis not present

## 2020-11-15 DIAGNOSIS — I6522 Occlusion and stenosis of left carotid artery: Secondary | ICD-10-CM

## 2020-11-15 DIAGNOSIS — E1169 Type 2 diabetes mellitus with other specified complication: Secondary | ICD-10-CM | POA: Diagnosis not present

## 2020-11-15 LAB — GLUCOSE, CAPILLARY
Glucose-Capillary: 134 mg/dL — ABNORMAL HIGH (ref 70–99)
Glucose-Capillary: 189 mg/dL — ABNORMAL HIGH (ref 70–99)
Glucose-Capillary: 177 mg/dL — ABNORMAL HIGH (ref 70–99)
Glucose-Capillary: 228 mg/dL — ABNORMAL HIGH (ref 70–99)
Glucose-Capillary: 247 mg/dL — ABNORMAL HIGH (ref 70–99)

## 2020-11-15 LAB — BASIC METABOLIC PANEL
Anion gap: 5 (ref 5–15)
BUN: 15 mg/dL (ref 8–23)
CO2: 26 mmol/L (ref 22–32)
Calcium: 8.7 mg/dL — ABNORMAL LOW (ref 8.9–10.3)
Chloride: 106 mmol/L (ref 98–111)
Creatinine, Ser: 1.2 mg/dL (ref 0.61–1.24)
GFR, Estimated: >60 mL/min (ref >60)
Glucose, Bld: 221 mg/dL — ABNORMAL HIGH (ref 70–99)
Potassium: 4 mmol/L (ref 3.5–5.1)
Sodium: 137 mmol/L (ref 135–145)

## 2020-11-15 LAB — CBC
HCT: 38.4 % — ABNORMAL LOW (ref 39.0–52.0)
Hemoglobin: 13.1 g/dL (ref 13.0–17.0)
MCH: 32.5 pg (ref 26.0–34.0)
MCHC: 34.1 g/dL (ref 30.0–36.0)
MCV: 95.3 fL (ref 80.0–100.0)
Platelets: 167 10*3/uL (ref 150–400)
RBC: 4.03 MIL/uL — ABNORMAL LOW (ref 4.22–5.81)
RDW: 14.3 % (ref 11.5–15.5)
WBC: 7.7 10*3/uL (ref 4.0–10.5)
nRBC: 0 % (ref 0.0–0.2)

## 2020-11-15 MED ORDER — CLOPIDOGREL BISULFATE 75 MG PO TABS
75.0000 mg | ORAL_TABLET | Freq: Every day | ORAL | Status: DC
  Administered 2020-11-16 – 2020-12-01 (×16): 75 mg via ORAL
  Filled 2020-11-15 (×16): qty 1

## 2020-11-15 NOTE — Evaluation (Signed)
Speech Language Pathology Evaluation Patient Details Name: Fred [name] MRN: [id number] DOB: 03/19/1935 Today's Date: 11/15/2020 Time: [id number] SLP Time Calculation (min) (ACUTE ONLY): 20 min  Problem List:  Patient Active Problem List   Diagnosis Date Noted   Acute CVA (cerebrovascular accident) ([place]) 11/13/2020   AKI (acute kidney injury) (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[place]) 09/25/2017   Chronic diastolic (congestive) heart failure (HCC)    Hemiparesis affecting left side as late effect of cerebrovascular accident ([place])    Small vessel disease ([place]) 08/11/2017   Left hemiparesis ([place])    History of CVA (cerebrovascular accident) 08/10/2017   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 of localized onset, not intractable, without status epilepticus ([place]) 06/20/2017   Primary osteoarthritis of both knees 05/11/2017   Stroke (cerebrum) ([place]) 03/21/2017   Type 2 diabetes mellitus with complication, with long-term current use of insulin ([place]) 03/27/2015   Atherosclerosis of native coronary artery of native heart without angina pectoris 03/27/2015   Palpitations 11/04/2014   S/P CABG (coronary artery bypass graft) 11/04/2014   Atrial fibrillation, unspecified    History of stroke 09/04/2014   Obesity (BMI 30-39.9) 11/29/2013   History of gout 05/16/2013   CHF, chronic ([place]) 04/20/2009   CARDIOVASCULAR FUNCTION STUDY, ABNORMAL 01/29/2009   ANEMIA / OTHER 05/16/2007   Coronary atherosclerosis 05/16/2007   PAROXYSMAL ATRIAL FIBRILLATION 05/16/2007   Subcortical infarction ([place]) 05/16/2007   Hyperlipidemia 03/30/2007   DEGENERATIVE  JOINT DISEASE 03/30/2007   PAROTID LESION, UNSPECIFIED 09/29/2006   Partial epilepsy with impairment of consciousness ([place]) 09/29/2006   Diabetic polyneuropathy ([place]) 09/29/2006   Hypertension associated with diabetes ([place]) 09/29/2006   PERIPHERAL VASCULAR DISEASE 09/29/2006   DIVERTICULOSIS 09/29/2006   Past Medical History:  Past Medical History:  Diagnosis Date   Atrial fibrillation ([place])    Post operative   CAD (coronary artery disease)    s/p cabg   CHF (congestive heart failure) (HCC)    Diverticulosis    DJD (degenerative joint disease)    DM (diabetes mellitus) ([place])    History of anemia    History of stroke    Hyperlipidemia    Hypertension    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 [name]   CORONARY ARTERY BYPASS GRAFT  02/13/2004   4 vesel   removal of sebaceous cyst from neck  08/2007   Dr. [name]   TONSILLECTOMY     HPI:  Pt is an 85 y/o male admitted 9/2 secondary to new onset R weakness. MRI brain: Two punctate foci of acute ischemia within the subcortical right  frontal lobe, 1 of which is along the right precentral gyrus. Advanced white matter disease most consistent with chronic small vessel ischemia. Pt passed [facility] screen on 8/2 and swallow eval ordered on 8/4. PMH: HTN, DM, seizures, a fib, CVA with L sided weakness, CHF, and CAD. MBS 08/27/19: min oropharyngeal  dysphagia with oral holding of bolus and decreased organization, swallow trigger at the level of the valleculae with liquids, min reduced tongue base retraction which cleared with dry swallow. No penetration or aspiration observed. A regular texture diet with thin liquids recommended.    Assessment / Plan / Recommendation Clinical Impression  Pt participated in speech/language/cognition evaluation with his granddaughter present. 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 He reported that he lives alone, but that his daughter's house is next to his and his granddaughter lives behind him. Per the pt, his family manages his medications and finances. Pt's granddaughter stated that the pt has demonstrated a cognitive decline his wife passed approximately 5 months prior, but that his performance today is worse than his baseline. Pt initially denied any changes in cognition, but subsequently stated that tasks "might be" a bit harder than they would've been prior. The [facility] Mental Status Examination was completed to evaluate the pt's cognitive-linguistic skills. He achieved a score of 10/30 which is below the normal limits of 27 or more out of 30. He exhibited difficulty in the areas of awareness, attention, memory, temporal orientation, and executive function. Pt's language skills were WNL. Articulatory precision was mildly reduced, but speech was intelligible and both parties stated that it was at baseline. Skilled SLP services are clinically indicated at this time to improve cognitive-linguistic function.    SLP Assessment  SLP Recommendation/Assessment: Patient needs continued Speech Lanaguage Pathology Services SLP Visit Diagnosis: Cognitive communication deficit (R41.841)    Follow Up Recommendations  Inpatient Rehab    Frequency and Duration min 2x/week  2 weeks      SLP Evaluation Cognition  Overall Cognitive Status: Impaired/Different from baseline Arousal/Alertness: Awake/alert Orientation Level: Oriented to person;Oriented to situation;Oriented to place;Disoriented to time Year: 2020 Month: April Day of Week: Correct Attention: Focused;Sustained Focused Attention: Appears intact Sustained Attention: Impaired Sustained Attention  Impairment: Verbal complex Memory: Impaired Memory Impairment: Retrieval deficit;Decreased recall of new information (Immediate: 5/5; dleayed: 1/5; with cues:3/4) Awareness: Impaired Awareness Impairment: Emergent impairment Problem Solving: Appears intact Executive Function: Sequencing;Organizing Sequencing: Impaired Sequencing Impairment: Verbal complex (Clock drawing: 2/4) Organizing: Impaired Organizing Impairment: Verbal complex (Backward digit span: 1/3)       Comprehension  Auditory Comprehension Overall Auditory Comprehension: Appears within functional limits for tasks assessed Yes/No Questions: Within Functional Limits Conversation: Complex Interfering Components: Processing speed    Expression Expression Primary Mode of Expression: Verbal Verbal Expression Overall Verbal Expression: Appears within functional limits for tasks assessed Initiation: No impairment Level of Generative/Spontaneous Verbalization: Conversation Naming: No impairment Pragmatics: No impairment Interfering Components: Attention   Oral / Motor  Oral Motor/Sensory Function Overall Oral Motor/Sensory Function: Within functional limits Motor Speech Overall Motor Speech: Impaired at baseline Respiration: Within functional limits Phonation: Normal Resonance: Within functional limits Articulation: Impaired Level of Impairment: Conversation Intelligibility: Intelligible Motor Planning: Witnin functional limits Motor Speech Errors: Aware;Consistent   Tavian Callander I. Hardin Negus, [place], [place] Office number [phone] Pager [phone]                     [name] 11/15/2020, 5:25 PM

## 2020-11-15 NOTE — Progress Notes (Signed)
[facility] PROGRESS NOTE   Fred [name] [id number] DOB: 27-Aug-1935 DOA: 11/13/2020  PCP: [name], MD  Brief History/Interval Summary: 85 y.o. male with medical history significant for history of CVA with residual left hemiplegia, wheelchair dependent, CAD s/p CABG, insulin-dependent T2DM, HTN, HLD, seizure disorder on Depakote, depression/anxiety, and single episode of postoperative atrial fibrillation who presented to the ED for evaluation of right-sided weakness.  Symptoms apparently began about 2 days prior to admission.  Patient was evaluated in the emergency department.  Underwent MRI which actually showed right-sided stroke.  Hospitalize for further management.  Patient was previously on aspirin and Plavix but recently taken off of Plavix and started on Eliquis by his cardiologist at the [facility].  This was for a diagnosis of atrial fibrillation.     Consultants: Neurology  Procedures: None  Antibiotics: Anti-infectives (From admission, onward)    None       Subjective/Interval History: Patient mentions that his speech is better today.  Otherwise he denies any other complaints.  No shortness of breath.     Assessment/Plan:  Acute CVA MRI showed acute infarct in the right cerebral hemisphere.  Patient came in with right-sided weakness.  Patient has prior history of CVA with residual left-sided hemiplegia.  Also was noted to have slurred speech.   Neurology is following.  Echocardiogram as below.  It showed normal systolic function.  Grade 1 diastolic dysfunction.  Mild LVH was noted.  Patient has been continued on Eliquis and aspirin.   Noted to be on atorvastatin as well.  LDL noted to be 52.  HbA1c 7.8.   CT angiogram head and neck does 60 presents to notices left ICA and 60% vertebral artery stenosis.  Neurology recommends follow-up with vascular surgery in the outpatient setting.   Seen by PT and OT.  Inpatient rehabilitation was recommended.   Has  not been speech seen by speech therapy yet.  History of paroxysmal atrial fibrillation Recently started on Eliquis by his cardiologist at the [place].  This is being continued.  Noted to be on metoprolol at home which is currently on hold to allow permissive hypertension.  Left lower extremity edema Dopplers negative for DVT.  Most likely dependent edema.  Elevated creatinine Creatinine was noted to be 1.3 on admission.  Noted to be normal today.  Losartan was placed on hold.  Essential hypertension Permissive hypertension is being allowed.  Diabetes mellitus type 2, insulin-dependent Noted to be on Toujeo at home.  Currently on glargine insulin.  HbA1c is 7.8.  CBGs are reasonably well controlled.  History of coronary artery disease status post CABG Stable.  Continue aspirin, statin, Zetia.  History of seizure disorder Continue Depakote.  Hyperlipidemia Continue statin and Zetia.  History of depression and anxiety Continue Lexapro doxepin.     DVT Prophylaxis: On Eliquis Code Status: Full code Family Communication: Discussed with the patient.  Discussed with daughter yesterday. Disposition Plan: CIR as recommended by therapy  Status is: Inpatient  Remains inpatient appropriate because:Inpatient level of care appropriate due to severity of illness  Dispo:  Patient From: Home  Planned Disposition: CIR  Medically stable for discharge: No           Medications: Scheduled:   stroke: mapping our early stages of recovery book   Does not apply Once   allopurinol  300 mg Oral Daily   apixaban  5 mg Oral BID   aspirin EC  81 mg Oral Daily   atorvastatin  80 mg Oral Daily   baclofen  10 mg Oral BID   divalproex  500 mg Oral QHS   doxepin  25 mg Oral QHS   escitalopram  10 mg Oral Daily   ezetimibe  10 mg Oral Daily   insulin aspart  0-9 Units Subcutaneous TID WC   insulin glargine-yfgn  22 Units Subcutaneous QHS   Continuous: [id number] **OR** acetaminophen  (TYLENOL) oral liquid 160 mg/5 mL **OR** acetaminophen, senna-docusate   Objective:  Vital Signs  Vitals:   11/14/20 0159 11/14/20 0631 11/14/20 2100 11/15/20 0418  BP:  122/67 (!) 157/77 131/67  Pulse:  64 88 67  Resp:  '20 20 20  '$ Temp: 98 F (36.7 C) 98.4 F (36.9 C) 98 F (36.7 C) 97.7 F (36.5 C)  TempSrc: Oral Oral Oral Axillary  SpO2:  99% 99% 99%  Weight:      Height:        Intake/Output Summary (Last 24 hours) at 11/15/2020 1116 Last data filed at 11/14/2020 2328 Gross per 24 hour  Intake 240 ml  Output --  Net 240 ml    Filed Weights   11/13/20 1900 11/13/20 2043  Weight: 82.6 kg 74.8 kg    General appearance: Awake alert.  In no distress Resp: Clear to auscultation bilaterally.  Normal effort Cardio: S1-S2 is normal regular.  No S3-S4.  No rubs murmurs or bruit GI: Abdomen is soft.  Nontender nondistended.  Bowel sounds are present normal.  No masses organomegaly Extremities: No edema.  Full range of motion of lower extremities. Neurologic: Known left hemiparesis from previous stroke.  No significant right-sided deficits noted.  Speech appears to be improved today.   Lab Results:  Data Reviewed: I have personally reviewed following labs and imaging studies  CBC: Recent Labs  Lab 11/13/20 1927 11/13/20 1933 11/15/20 0101  WBC 9.4  --  7.7  NEUTROABS 7.0  --   --   HGB 13.7 14.6 13.1  HCT 42.1 43.0 38.4*  MCV 98.4  --  95.3  PLT 190  --  167     Basic Metabolic Panel: Recent Labs  Lab 11/13/20 1927 11/13/20 1933 11/15/20 0101  NA 133* 136 137  K 4.7 4.7 4.0  CL 101 102 106  CO2 24  --  26  GLUCOSE 237* 226* 221*  BUN '16 17 15  '$ CREATININE 1.30* 1.20 1.20  CALCIUM 8.9  --  8.7*     GFR: Estimated Creatinine Clearance: 42.3 mL/min (by C-G formula based on SCr of 1.2 mg/dL).  Liver Function Tests: Recent Labs  Lab 11/13/20 1927  AST 21  ALT 20  ALKPHOS 59  BILITOT 0.6  PROT 6.5  ALBUMIN 3.2*      Coagulation  Profile: Recent Labs  Lab 11/13/20 1927  INR 1.3*      HbA1C: Recent Labs    11/14/20 0353  HGBA1C 7.8*     CBG: Recent Labs  Lab 11/13/20 1926 11/14/20 0827 11/14/20 1149 11/14/20 2252 11/15/20 0746  GLUCAP 222* 135* 106* 244* 134*     Lipid Profile: Recent Labs    11/14/20 0353  CHOL 98  HDL 28*  LDLCALC 52  TRIG 92  CHOLHDL 3.5       Recent Results (from the past 240 hour(s))  Resp Panel by RT-PCR (Flu A&B, Covid) Nasopharyngeal Swab     Status: None   Collection Time: 11/13/20  7:39 PM   Specimen: Nasopharyngeal Swab; Nasopharyngeal(NP) swabs in vial transport medium  Result Value Ref Range Status   SARS Coronavirus 2 by RT PCR NEGATIVE NEGATIVE Final    Comment: (NOTE) SARS-CoV-2 target nucleic acids are NOT DETECTED.  The SARS-CoV-2 RNA is generally detectable in upper respiratory specimens during the acute phase of infection. The lowest concentration of SARS-CoV-2 viral copies this assay can detect is 138 copies/mL. A negative result does not preclude SARS-Cov-2 infection and should not be used as the sole basis for treatment or other patient management decisions. A negative result may occur with  improper specimen collection/handling, submission of specimen other than nasopharyngeal swab, presence of viral mutation(s) within the areas targeted by this assay, and inadequate number of viral copies(<138 copies/mL). A negative result must be combined with clinical observations, patient history, and epidemiological information. The expected result is Negative.  Fact Sheet for Patients:  EntrepreneurPulse.com.au  Fact Sheet for Healthcare Providers:  IncredibleEmployment.be  This test is no t yet approved or cleared by the [place] FDA and  has been authorized for detection and/or diagnosis of SARS-CoV-2 by FDA under an Emergency Use Authorization (EUA). This EUA will remain  in effect (meaning this  test can be used) for the duration of the COVID-19 declaration under Section 564(b)(1) of the Act, 21 U.S.C.section 360bbb-3(b)(1), unless the authorization is terminated  or revoked sooner.       Influenza A by PCR NEGATIVE NEGATIVE Final   Influenza B by PCR NEGATIVE NEGATIVE Final    Comment: (NOTE) The Xpert Xpress SARS-CoV-2/FLU/RSV plus assay is intended as an aid in the diagnosis of influenza from Nasopharyngeal swab specimens and should not be used as a sole basis for treatment. Nasal washings and aspirates are unacceptable for Xpert Xpress SARS-CoV-2/FLU/RSV testing.  Fact Sheet for Patients: EntrepreneurPulse.com.au  Fact Sheet for Healthcare Providers: IncredibleEmployment.be  This test is not yet approved or cleared by the [place] FDA and has been authorized for detection and/or diagnosis of SARS-CoV-2 by FDA under an Emergency Use Authorization (EUA). This EUA will remain in effect (meaning this test can be used) for the duration of the COVID-19 declaration under Section 564(b)(1) of the Act, 21 U.S.C. section 360bbb-3(b)(1), unless the authorization is terminated or revoked.  Performed at [facility], [place] [address] [place], [place] [address]        Radiology Studies: CT ANGIO NECK W OR WO CONTRAST  Result Date: 11/14/2020 CLINICAL DATA:  Stroke workup EXAM: CT ANGIOGRAPHY NECK TECHNIQUE: Multidetector CT imaging of the neck was performed using the standard protocol during bolus administration of intravenous contrast. Multiplanar CT image reconstructions and MIPs were obtained to evaluate the vascular anatomy. Carotid stenosis measurements (when applicable) are obtained utilizing NASCET criteria, using the distal internal carotid diameter as the denominator. CONTRAST:  42m OMNIPAQUE IOHEXOL 350 MG/ML SOLN COMPARISON:  Brain MRI and MRA from yesterday and today respectively FINDINGS: Aortic arch: Atheromatous  plaque.  Prior CABG. Right carotid system: Diffuse atherosclerosis that diffuse intermittent atheromatous plaque of the common carotid and proximal ICA. No flow limiting stenosis of 50% or greater. Moderate plaque irregularity without dissection or clear ulceration. Left carotid system: Atheromatous plaque diffusely involving the common carotid which is small compared to the right. Extensive plaque at the bifurcation and proximal ICA with proximal ICA stenosis measuring 60%. No dissection. ICA tortuosity with looping below the skull base. Vertebral arteries: No proximal subclavian stenosis of hemodynamic significance. Calcified plaque at both vertebral origins with 60% stenosis on both sides as measured on coronal reformats. Negative for dissection  or beading. Skeleton: Cervical spine degeneration with reversed lordosis and C4-5, C5-6 and mild anterolisthesis. Other neck: No acute finding Upper chest: Airway thickening IMPRESSION: 1. Cervical carotid atherosclerosis with 60% narrowing at the left ICA origin. 2. 60% bilateral vertebral origin stenosis. Electronically Signed   By: Monte Fantasia M.D.   On: 11/14/2020 11:44   MR ANGIO HEAD WO CONTRAST  Result Date: 11/14/2020 CLINICAL DATA:  Stroke follow-up EXAM: MRA HEAD WITHOUT CONTRAST TECHNIQUE: Angiographic images of the Circle of Willis were acquired using MRA technique without intravenous contrast. COMPARISON:  No pertinent prior exam. FINDINGS: POSTERIOR CIRCULATION: --Vertebral arteries: Normal --Inferior cerebellar arteries: Normal. --Basilar artery: Normal. --Superior cerebellar arteries: Normal. --Posterior cerebral arteries: Normal. ANTERIOR CIRCULATION: --Intracranial internal carotid arteries: Normal. --Anterior cerebral arteries (ACA): Normal. --Middle cerebral arteries (MCA): Normal. ANATOMIC VARIANTS: None IMPRESSION: Normal intracranial MRA. Electronically Signed   By: Ulyses Jarred M.D.   On: 11/14/2020 01:54   MR BRAIN WO CONTRAST  Result  Date: 11/13/2020 CLINICAL DATA:  Right-sided weakness EXAM: MRI HEAD WITHOUT CONTRAST TECHNIQUE: Multiplanar, multiecho pulse sequences of the brain and surrounding structures were obtained without intravenous contrast. COMPARISON:  None. FINDINGS: Brain: There are 2 punctate foci of abnormal diffusion restriction within the subcortical right frontal lobe, 1 of which is along the right precentral gyrus. There is diffuse, severe atrophy. There is advanced white matter disease most consistent with chronic small vessel ischemia. There is no acute or chronic hemorrhage. Vascular: Normal flow voids. Skull and upper cervical spine: Normal marrow signal. Sinuses/Orbits: Negative. Other: None. IMPRESSION: 1. Two punctate foci of acute ischemia within the subcortical right frontal lobe, 1 of which is along the right precentral gyrus. 2. No hemorrhage or mass effect. 3. Advanced white matter disease most consistent with chronic small vessel ischemia. Electronically Signed   By: Ulyses Jarred M.D.   On: 11/13/2020 20:52   EEG adult  Result Date: 11/14/2020 Greta Doom, MD     11/14/2020  2:07 PM History: 85 yo M With right sided weakness and confusion Sedation: none Technique: This EEG was acquired with electrodes placed according to the International 10-20 electrode system (including Fp1, Fp2, F3, F4, C3, C4, P3, P4, O1, O2, T3, T4, T5, T6, A1, A2, Fz, Cz, Pz). The following electrodes were missing or displaced: none. Background: The background consists of intermixed generalized irregular delta and theta range activities.  There is no definite posterior dominant rhythm seen.  There are occasional brief runs of beta range activity consistent with spindles.  There are times where there is slightly faster underlying background, but no definite PDR. Photic stimulation: Physiologic driving is not performed EEG Abnormalities: 1) lack of waking recording Clinical Interpretation: This EEG was recorded in the drowsy and  sleeping state, the lack of waking state limits the ability to assess for encephalopathy. There was no seizure or seizure predisposition recorded on this study. Please note that lack of epileptiform activity on EEG does not preclude the possibility of epilepsy. Roland Rack, MD [facility] [phone] If 7pm- 7am, please page neurology on call as listed in [place].   ECHOCARDIOGRAM COMPLETE  Result Date: 11/14/2020    ECHOCARDIOGRAM REPORT   Patient Name:   Fred [name] Date of Exam: 11/14/2020 Medical Rec #:  [id number]        Height:       63.0 in Accession #:    [id number]       Weight:       165.0 lb Date of  Birth:  1935-08-29        BSA:          1.782 m Patient Age:    50 years         BP:           122/67 mmHg Patient Gender: M                HR:           64 bpm. Exam Location:  Inpatient Procedure: 2D Echo, Cardiac Doppler and Color Doppler Indications:    Stroke  History:        Patient has prior history of Echocardiogram examinations, most                 recent 08/10/2017. CHF, CAD, Prior CABG, Stroke,                 Arrythmias:Atrial Fibrillation; Risk Factors:Hypertension,                 Diabetes and Dyslipidemia.  Sonographer:    Wenda Low Referring Phys: [id number] [place]  1. Left ventricular ejection fraction, by estimation, is 60 to 65%. The left ventricle has normal function. The left ventricle has no regional wall motion abnormalities. There is mild left ventricular hypertrophy. Left ventricular diastolic parameters are consistent with Grade I diastolic dysfunction (impaired relaxation).  2. Right ventricular systolic function is normal. The right ventricular size is normal. There is normal pulmonary artery systolic pressure.  3. Left atrial size was moderately dilated.  4. The mitral valve is normal in structure. No evidence of mitral valve regurgitation. No evidence of mitral stenosis.  5. The aortic valve is normal in structure. Aortic valve  regurgitation is trivial. Mild to moderate aortic valve sclerosis/calcification is present, without any evidence of aortic stenosis.  6. The inferior vena cava is normal in size with greater than 50% respiratory variability, suggesting right atrial pressure of 3 mmHg. Conclusion(s)/Recommendation(s): No intracardiac source of embolism detected on this transthoracic study. A transesophageal echocardiogram is recommended to exclude cardiac source of embolism if clinically indicated. FINDINGS  Left Ventricle: Left ventricular ejection fraction, by estimation, is 60 to 65%. The left ventricle has normal function. The left ventricle has no regional wall motion abnormalities. The left ventricular internal cavity size was normal in size. There is  mild left ventricular hypertrophy. Left ventricular diastolic parameters are consistent with Grade I diastolic dysfunction (impaired relaxation). Right Ventricle: The right ventricular size is normal. No increase in right ventricular wall thickness. Right ventricular systolic function is normal. There is normal pulmonary artery systolic pressure. The tricuspid regurgitant velocity is 2.55 m/s, and  with an assumed right atrial pressure of 3 mmHg, the estimated right ventricular systolic pressure is 0000000 mmHg. Left Atrium: Left atrial size was moderately dilated. Right Atrium: Right atrial size was normal in size. Pericardium: There is no evidence of pericardial effusion. Mitral Valve: The mitral valve is normal in structure. No evidence of mitral valve regurgitation. No evidence of mitral valve stenosis. Tricuspid Valve: The tricuspid valve is normal in structure. Tricuspid valve regurgitation is not demonstrated. No evidence of tricuspid stenosis. Aortic Valve: The aortic valve is normal in structure. Aortic valve regurgitation is trivial. Mild to moderate aortic valve sclerosis/calcification is present, without any evidence of aortic stenosis. Aortic valve mean gradient  measures 6.0 mmHg. Aortic valve peak gradient measures 11.4 mmHg. Aortic valve area, by VTI measures 1.58 cm. Pulmonic Valve: The pulmonic valve was  normal in structure. Pulmonic valve regurgitation is not visualized. No evidence of pulmonic stenosis. Aorta: The aortic root is normal in size and structure. Venous: The inferior vena cava is normal in size with greater than 50% respiratory variability, suggesting right atrial pressure of 3 mmHg. IAS/Shunts: No atrial level shunt detected by color flow Doppler.  LEFT VENTRICLE PLAX 2D LVIDd:         3.80 cm     Diastology LVIDs:         2.60 cm     LV e' medial:    6.20 cm/s LV PW:         1.20 cm     LV E/e' medial:  15.8 LV IVS:        1.10 cm     LV e' lateral:   8.05 cm/s LVOT diam:     2.00 cm     LV E/e' lateral: 12.2 LV SV:         62 LV SV Index:   35 LVOT Area:     3.14 cm  LV Volumes (MOD) LV vol d, MOD A2C: 64.0 ml LV vol d, MOD A4C: 77.4 ml LV vol s, MOD A2C: 25.8 ml LV vol s, MOD A4C: 30.0 ml LV SV MOD A2C:     38.2 ml LV SV MOD A4C:     77.4 ml LV SV MOD BP:      44.4 ml RIGHT VENTRICLE RV Basal diam:  3.15 cm RV Mid diam:    3.80 cm RV S prime:     8.39 cm/s TAPSE (M-mode): 1.7 cm LEFT ATRIUM             Index       RIGHT ATRIUM           Index LA diam:        4.90 cm 2.75 cm/m  RA Area:     11.90 cm LA Vol (A2C):   56.0 ml 31.43 ml/m RA Volume:   21.40 ml  12.01 ml/m LA Vol (A4C):   57.5 ml 32.27 ml/m LA Biplane Vol: 58.6 ml 32.89 ml/m  AORTIC VALVE AV Area (Vmax):    1.59 cm AV Area (Vmean):   1.56 cm AV Area (VTI):     1.58 cm AV Vmax:           169.00 cm/s AV Vmean:          115.000 cm/s AV VTI:            0.392 m AV Peak Grad:      11.4 mmHg AV Mean Grad:      6.0 mmHg LVOT Vmax:         85.50 cm/s LVOT Vmean:        57.100 cm/s LVOT VTI:          0.197 m LVOT/AV VTI ratio: 0.50 MITRAL VALVE                TRICUSPID VALVE MV Area (PHT): 3.37 cm     TR Peak grad:   26.0 mmHg MV Decel Time: 225 msec     TR Vmax:        255.00 cm/s MV E  velocity: 98.10 cm/s MV A velocity: 118.00 cm/s  SHUNTS MV E/A ratio:  0.83         Systemic VTI:  0.20 m  Systemic Diam: 2.00 cm Candee Furbish MD Electronically signed by Candee Furbish MD Signature Date/Time: 11/14/2020/3:42:26 PM    Final    CT HEAD CODE STROKE WO CONTRAST  Result Date: 11/13/2020 CLINICAL DATA:  Code stroke.  Acute neurologic deficit EXAM: CT HEAD WITHOUT CONTRAST TECHNIQUE: Contiguous axial images were obtained from the base of the skull through the vertex without intravenous contrast. COMPARISON:  None. FINDINGS: Brain: There is no mass, hemorrhage or extra-axial collection. There is generalized atrophy without lobar predilection. There is hypoattenuation of the periventricular white matter, most commonly indicating chronic ischemic microangiopathy. Vascular: Atherosclerotic calcification of the vertebral and internal carotid arteries at the skull base. No abnormal hyperdensity of the major intracranial arteries or dural venous sinuses. Skull: The visualized skull base, calvarium and extracranial soft tissues are normal. Sinuses/Orbits: No fluid levels or advanced mucosal thickening of the visualized paranasal sinuses. No mastoid or middle ear effusion. The orbits are normal. ASPECTS [facility] Stroke Program Early CT Score) - Ganglionic level infarction (caudate, lentiform nuclei, internal capsule, insula, M1-M3 cortex): 7 - Supraganglionic infarction (M4-M6 cortex): 3 Total score (0-10 with 10 being normal): 10 IMPRESSION: 1. No acute intracranial abnormality. 2. ASPECTS is 10. 3. Chronic ischemic microangiopathy and generalized atrophy. These results were communicated to Dr. Donnetta Simpers at 7:42 pm on 11/13/2020 by text page via the [facility] messaging system. Electronically Signed   By: Ulyses Jarred M.D.   On: 11/13/2020 19:42   VAS [place] LOWER EXTREMITY VENOUS (DVT)  Result Date: 11/14/2020  Lower Venous DVT Study Patient Name:  JAIMAR DEMATTOS [facility]  Date of Exam:    11/14/2020 Medical Rec #: [id number]         Accession #:    [id number] Date of Birth: 01/12/1936         Patient Gender: M Patient Age:   80 years Exam Location:  [facility] Procedure:      VAS [place] LOWER EXTREMITY VENOUS (DVT) Referring Phys: Roxanne Mins PATEL --------------------------------------------------------------------------------  Indications: Edema.  Limitations: Poor ultrasound/tissue interface, body habitus and restricted mobility. Comparison Study: No prior study Performing Technologist: Maudry Mayhew MHA, RDMS, RVT, RDCS  Examination Guidelines: A complete evaluation includes B-mode imaging, spectral Doppler, color Doppler, and power Doppler as needed of all accessible portions of each vessel. Bilateral testing is considered an integral part of a complete examination. Limited examinations for reoccurring indications may be performed as noted. The reflux portion of the exam is performed with the patient in reverse Trendelenburg.  +-----+---------------+---------+-----------+----------+--------------+ RIGHTCompressibilityPhasicitySpontaneityPropertiesThrombus Aging +-----+---------------+---------+-----------+----------+--------------+ CFV  Full           Yes      Yes                                 +-----+---------------+---------+-----------+----------+--------------+   +---------+---------------+---------+-----------+----------+--------------+ LEFT     CompressibilityPhasicitySpontaneityPropertiesThrombus Aging +---------+---------------+---------+-----------+----------+--------------+ CFV      Full           Yes      Yes                                 +---------+---------------+---------+-----------+----------+--------------+ SFJ      Full                                                        +---------+---------------+---------+-----------+----------+--------------+  FV Prox  Full                                                         +---------+---------------+---------+-----------+----------+--------------+ FV Mid   Full                                                        +---------+---------------+---------+-----------+----------+--------------+ FV DistalFull                                                        +---------+---------------+---------+-----------+----------+--------------+ PFV      Full                                                        +---------+---------------+---------+-----------+----------+--------------+ POP      Full           Yes      Yes                                 +---------+---------------+---------+-----------+----------+--------------+ PTV      Full                                                        +---------+---------------+---------+-----------+----------+--------------+ PERO     Full                                                        +---------+---------------+---------+-----------+----------+--------------+    Summary: RIGHT: - No evidence of common femoral vein obstruction.  LEFT: - There is no evidence of deep vein thrombosis in the lower extremity.  - No cystic structure found in the popliteal fossa.  *See table(s) above for measurements and observations. Electronically signed by Deitra Mayo MD on 11/14/2020 at 12:00:25 PM.    Final        LOS: 1 day   [facility] Pager on [email or web address]  11/15/2020, 11:16 AM

## 2020-11-15 NOTE — Evaluation (Signed)
**Note Fred-Identified via Obfuscation** Clinical/Bedside Swallow Evaluation Patient Details  Name: Fred [name] MRN: [id number] Date of Birth: Jun 23, 1935  Today's Date: 11/15/2020 Time: SLP Start Time (ACUTE ONLY): 1627 SLP Stop Time (ACUTE ONLY): [id number] SLP Time Calculation (min) (ACUTE ONLY): 11 min  Past Medical History:  Past Medical History:  Diagnosis Date   Atrial fibrillation ([place])    Post operative   CAD (coronary artery disease)    s/p cabg   CHF (congestive heart failure) (HCC)    Diverticulosis    DJD (degenerative joint disease)    DM (diabetes mellitus) ([place])    History of anemia    History of stroke    Hyperlipidemia    Hypertension    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 Rutherford [name]   CORONARY ARTERY BYPASS GRAFT  02/13/2004   4 vesel   removal of sebaceous cyst from neck  08/2007   Dr. Brantley [name]   TONSILLECTOMY     HPI:  Pt is an 85 y/o male admitted 9/2 secondary to new onset R weakness. MRI brain: Two punctate foci of acute ischemia within the subcortical right  frontal lobe, 1 of which is along the right precentral gyrus. Advanced white matter disease most consistent with chronic small vessel ischemia. Pt passed [facility] screen on 8/2 and swallow eval ordered on 8/4. PMH: HTN, DM, seizures, a fib, CVA with L sided weakness, CHF, and CAD. MBS 08/27/19: min oropharyngeal dysphagia with oral holding of bolus and decreased organization, swallow trigger at the level of the valleculae with liquids, min reduced tongue base retraction which cleared with dry swallow. No penetration or aspiration observed. A regular texture diet with thin liquids recommended.   Assessment / Plan / Recommendation Clinical  Impression  Pt was seen for bedside swallow evaluation and he denied a history of dysphagia. These reports were corroborated by his granddaughter who arrived towards the end of the evaluation and his RN denied pt having any difficulty with p.o. intake. Oral motor strength and ROM appeared grossly WFL. Pt was edentulous and he reported that he has been consuming regular texture solids without difficulty for the past year since he stopped wearing his dentures. He tolerated all solids and liquids without signs or symptoms of oropharyngeal dysphagia. A regular texture diet with thin liquids is recommended at this time and further skilled SLP services are not clinically indicated for swallowing. SLP will follow for speech-language evaluation. SLP Visit Diagnosis: Dysphagia, unspecified (R13.10)    Aspiration Risk  No limitations    Diet Recommendation Regular;Thin liquid   Liquid Administration via: Cup;Straw Medication Administration: Whole meds with liquid Supervision: Patient able to self feed Postural Changes: Seated upright at 90 degrees    Other  Recommendations Oral Care Recommendations: Oral care BID   Follow up Recommendations None      Frequency and Duration            Prognosis        Swallow Study   General Date of Onset: 11/14/20 HPI: Pt is an 85 y/o male admitted 9/2 secondary to new onset R weakness. MRI brain: Two punctate foci of acute ischemia within the subcortical right  frontal lobe, 1  of which is along the right precentral gyrus. Advanced white matter disease most consistent with chronic small vessel ischemia. Pt passed [facility] screen on 8/2 and swallow eval ordered on 8/4. PMH: HTN, DM, seizures, a fib, CVA with L sided weakness, CHF, and CAD. MBS 08/27/19: min oropharyngeal dysphagia with oral holding of bolus and decreased organization, swallow trigger at the level of the valleculae with liquids, min reduced tongue base retraction which cleared with dry swallow. No  penetration or aspiration observed. A regular texture diet with thin liquids recommended. Type of Study: Bedside Swallow Evaluation Previous Swallow Assessment: See HPI Diet Prior to this Study: Regular;Thin liquids Temperature Spikes Noted: No Respiratory Status: Room air History of Recent Intubation: No Behavior/Cognition: Alert;Cooperative;Pleasant mood Oral Cavity Assessment: Within Functional Limits Oral Care Completed by SLP: No Oral Cavity - Dentition: Adequate natural dentition Vision: Functional for self-feeding Self-Feeding Abilities: Needs assist Patient Positioning: Upright in bed;Postural control adequate for testing Baseline Vocal Quality: Normal Volitional Cough: Weak Volitional Swallow: Able to elicit    Oral/Motor/Sensory Function Overall Oral Motor/Sensory Function: Within functional limits   Ice Chips Ice chips: Within functional limits Presentation: Spoon   Thin Liquid Thin Liquid: Within functional limits Presentation: Straw    Nectar Thick Nectar Thick Liquid: Not tested   Honey Thick Honey Thick Liquid: Not tested   Puree Puree: Within functional limits Presentation: Spoon   Solid     Solid: Within functional limits Presentation: [place] I. Fred [name], Fred [name], Fred [name] Fred [name] [phone] Fred [name] [phone]   Fred [name] 11/15/2020,5:11 PM

## 2020-11-15 NOTE — Plan of Care (Addendum)
Daughter, Davell Hafez, has advised they would like for patient's dispo plan [facility]. I have messaged [facility] to assist with sorting out discharge planning with patient and family. I explained that insurance and dispo planning are delayed by the holiday weekend/Labor Day Monday.

## 2020-11-15 NOTE — Progress Notes (Addendum)
STROKE TEAM PROGRESS NOTE   INTERVAL HISTORY  Patient reports he feels okay today, is in good spirits and joking with examiner/grand daughter at bedside. He denies any new symptoms or concerns overnight or today. He states we should do whatever we think is best about his medication plan.  Questions were answered.  OBJECTIVE Vitals:   11/14/20 0631 11/14/20 2100 11/15/20 0418 11/15/20 1239  BP: 122/67 (!) 157/77 131/67 (!) 99/49  Pulse: 64 88 67 90  Resp: '20 20 20 19  '$ Temp: 98.4 F (36.9 C) 98 F (36.7 C) 97.7 F (36.5 C) 98.4 F (36.9 C)  TempSrc: Oral Oral Axillary Oral  SpO2: 99% 99% 99% 95%  Weight:      Height:        CBC:  Recent Labs  Lab 11/13/20 1927 11/13/20 1933 11/15/20 0101  WBC 9.4  --  7.7  NEUTROABS 7.0  --   --   HGB 13.7 14.6 13.1  HCT 42.1 43.0 38.4*  MCV 98.4  --  95.3  PLT 190  --  [id number]   Basic Metabolic Panel:  Recent Labs  Lab 11/13/20 1927 11/13/20 1933 11/15/20 0101  NA 133* 136 137  K 4.7 4.7 4.0  CL 101 102 106  CO2 24  --  26  GLUCOSE 237* 226* 221*  BUN '16 17 15  '$ CREATININE 1.30* 1.20 1.20  CALCIUM 8.9  --  8.7*    Lipid Panel:     Component Value Date/Time   CHOL 98 11/14/2020 0353   TRIG 92 11/14/2020 0353   HDL 28 (L) 11/14/2020 0353   CHOLHDL 3.5 11/14/2020 0353   VLDL 18 11/14/2020 0353   LDLCALC 52 11/14/2020 0353   HgbA1c:  Lab Results  Component Value Date   HGBA1C 7.8 (H) 11/14/2020   Urine Drug Screen:     Component Value Date/Time   LABOPIA NONE DETECTED 08/10/2017 0001   COCAINSCRNUR NONE DETECTED 08/10/2017 0001   LABBENZ NONE DETECTED 08/10/2017 0001   AMPHETMU NONE DETECTED 08/10/2017 0001   THCU NONE DETECTED 08/10/2017 0001   LABBARB NONE DETECTED 08/10/2017 0001    Alcohol Level     Component Value Date/Time   ETH <10 08/09/2017 2328    IMAGING  CT ANGIO NECK W OR WO CONTRAST Result Date: 11/14/2020 CLINICAL DATA:  Stroke workup EXAM: CT ANGIOGRAPHY NECK TECHNIQUE: Multidetector CT  imaging of the neck was performed using the standard protocol during bolus administration of intravenous contrast. Multiplanar CT image reconstructions and MIPs were obtained to evaluate the vascular anatomy. Carotid stenosis measurements (when applicable) are obtained utilizing NASCET criteria, using the distal internal carotid diameter as the denominator. CONTRAST:  27m OMNIPAQUE IOHEXOL 350 MG/ML SOLN COMPARISON:  Brain MRI and MRA from yesterday and today respectively FINDINGS: Aortic arch: Atheromatous plaque.  Prior CABG. Right carotid system: Diffuse atherosclerosis that diffuse intermittent atheromatous plaque of the common carotid and proximal ICA. No flow limiting stenosis of 50% or greater. Moderate plaque irregularity without dissection or clear ulceration. Left carotid system: Atheromatous plaque diffusely involving the common carotid which is small compared to the right. Extensive plaque at the bifurcation and proximal ICA with proximal ICA stenosis measuring 60%. No dissection. ICA tortuosity with looping below the skull base. Vertebral arteries: No proximal subclavian stenosis of hemodynamic significance. Calcified plaque at both vertebral origins with 60% stenosis on both sides as measured on coronal reformats. Negative for dissection or beading. Skeleton: Cervical spine degeneration with reversed lordosis and C4-5, C5-6 and  mild anterolisthesis. Other neck: No acute finding Upper chest: Airway thickening IMPRESSION:  1. Cervical carotid atherosclerosis with 60% narrowing at the left ICA origin.  2. 60% bilateral vertebral origin stenosis.  Electronically Signed   By: Monte Fantasia M.D.   On: 11/14/2020 11:44   MR ANGIO HEAD WO CONTRAST Result Date: 11/14/2020 CLINICAL DATA:  Stroke follow-up EXAM: MRA HEAD WITHOUT CONTRAST TECHNIQUE: Angiographic images of the Circle of Willis were acquired using MRA technique without intravenous contrast. COMPARISON:  No pertinent prior exam. FINDINGS:  POSTERIOR CIRCULATION: --Vertebral arteries: Normal --Inferior cerebellar arteries: Normal. --Basilar artery: Normal. --Superior cerebellar arteries: Normal. --Posterior cerebral arteries: Normal. ANTERIOR CIRCULATION: --Intracranial internal carotid arteries: Normal. --Anterior cerebral arteries (ACA): Normal. --Middle cerebral arteries (MCA): Normal. ANATOMIC VARIANTS: None  IMPRESSION:  Normal intracranial MRA.  Electronically Signed   By: Ulyses Jarred M.D.   On: 11/14/2020 01:54   MR BRAIN WO CONTRAST Result Date: 11/13/2020 CLINICAL DATA:  Right-sided weakness EXAM: MRI HEAD WITHOUT CONTRAST TECHNIQUE: Multiplanar, multiecho pulse sequences of the brain and surrounding structures were obtained without intravenous contrast. COMPARISON:  None. FINDINGS: Brain: There are 2 punctate foci of abnormal diffusion restriction within the subcortical right frontal lobe, 1 of which is along the right precentral gyrus. There is diffuse, severe atrophy. There is advanced white matter disease most consistent with chronic small vessel ischemia. There is no acute or chronic hemorrhage. Vascular: Normal flow voids. Skull and upper cervical spine: Normal marrow signal. Sinuses/Orbits: Negative. Other: None.  IMPRESSION:  1. Two punctate foci of acute ischemia within the subcortical right frontal lobe, 1 of which is along the right precentral gyrus.  2. No hemorrhage or mass effect.  3. Advanced white matter disease most consistent with chronic small vessel ischemia.  Electronically Signed   By: Ulyses Jarred M.D.   On: 11/13/2020 20:52   EEG adult Result Date: 11/14/2020 Greta Doom, MD     11/14/2020  2:07 PM History: 85 yo M With right sided weakness and confusion Sedation: none Technique: This EEG was acquired with electrodes placed according to the International 10-20 electrode system (including Fp1, Fp2, F3, F4, C3, C4, P3, P4, O1, O2, T3, T4, T5, T6, A1, A2, Fz, Cz, Pz). The following electrodes were  missing or displaced: none. Background: The background consists of intermixed generalized irregular delta and theta range activities.  There is no definite posterior dominant rhythm seen.  There are occasional brief runs of beta range activity consistent with spindles.  There are times where there is slightly faster underlying background, but no definite PDR. Photic stimulation: Physiologic driving is not performed EEG Abnormalities: 1) lack of waking recording Clinical Interpretation: This EEG was recorded in the drowsy and sleeping state, the lack of waking state limits the ability to assess for encephalopathy. There was no seizure or seizure predisposition recorded on this study. Please note that lack of epileptiform activity on EEG does not preclude the possibility of epilepsy.  Roland Rack, MD [facility] [phone] If 7pm- 7am, please page neurology on call as listed in [address].   CT HEAD CODE STROKE WO CONTRAST Result Date: 11/13/2020 CLINICAL DATA:  Code stroke.  Acute neurologic deficit EXAM: CT HEAD WITHOUT CONTRAST TECHNIQUE: Contiguous axial images were obtained from the base of the skull through the vertex without intravenous contrast. COMPARISON:  None. FINDINGS: Brain: There is no mass, hemorrhage or extra-axial collection. There is generalized atrophy without lobar predilection. There is hypoattenuation of the periventricular white matter, most commonly indicating  chronic ischemic microangiopathy. Vascular: Atherosclerotic calcification of the vertebral and internal carotid arteries at the skull base. No abnormal hyperdensity of the major intracranial arteries or dural venous sinuses. Skull: The visualized skull base, calvarium and extracranial soft tissues are normal. Sinuses/Orbits: No fluid levels or advanced mucosal thickening of the visualized paranasal sinuses. No mastoid or middle ear effusion. The orbits are normal. ASPECTS [facility] Stroke Program Early CT Score) -  Ganglionic level infarction (caudate, lentiform nuclei, internal capsule, insula, M1-M3 cortex): 7 - Supraganglionic infarction (M4-M6 cortex): 3 Total score (0-10 with 10 being normal): 10  IMPRESSION:  1. No acute intracranial abnormality.  2. ASPECTS is 10.  3. Chronic ischemic microangiopathy and generalized atrophy.  These results were communicated to Dr. Donnetta Simpers at 7:42 pm on 11/13/2020 by text page via the [facility] messaging system.  Electronically Signed   By: Ulyses Jarred M.D.   On: 11/13/2020 19:42   VAS [place] LOWER EXTREMITY VENOUS (DVT) Result Date: 11/14/2020  Lower Venous DVT Study Patient Name:  DEXTIN MARCUS [facility]  Date of Exam:   11/14/2020 Medical Rec #: [id number]         Accession #:    [id number] Date of Birth: [birth year]         Patient Gender: M Patient Age:   27 years Exam Location:  [facility] Procedure:      VAS [place] LOWER EXTREMITY VENOUS (DVT) Referring Phys: Roxanne Mins PATEL --------------------------------------------------------------------------------  Indications: Edema.  Limitations: Poor ultrasound/tissue interface, body habitus and restricted mobility. Comparison Study: No prior study Performing Technologist: Maudry Mayhew MHA, RDMS, RVT, RDCS  Examination Guidelines: A complete evaluation includes B-mode imaging, spectral Doppler, color Doppler, and power Doppler as needed of all accessible portions of each vessel. Bilateral testing is considered an integral part of a complete examination. Limited examinations for reoccurring indications may be performed as noted. The reflux portion of the exam is performed with the patient in reverse Trendelenburg.  +-----+---------------+---------+-----------+----------+--------------+ RIGHTCompressibilityPhasicitySpontaneityPropertiesThrombus Aging +-----+---------------+---------+-----------+----------+--------------+ CFV  Full           Yes      Yes                                  +-----+---------------+---------+-----------+----------+--------------+   +---------+---------------+---------+-----------+----------+--------------+ LEFT     CompressibilityPhasicitySpontaneityPropertiesThrombus Aging +---------+---------------+---------+-----------+----------+--------------+ CFV      Full           Yes      Yes                                 +---------+---------------+---------+-----------+----------+--------------+ SFJ      Full                                                        +---------+---------------+---------+-----------+----------+--------------+ FV Prox  Full                                                        +---------+---------------+---------+-----------+----------+--------------+ FV Mid   Full                                                        +---------+---------------+---------+-----------+----------+--------------+  FV DistalFull                                                        +---------+---------------+---------+-----------+----------+--------------+ PFV      Full                                                        +---------+---------------+---------+-----------+----------+--------------+ POP      Full           Yes      Yes                                 +---------+---------------+---------+-----------+----------+--------------+ PTV      Full                                                        +---------+---------------+---------+-----------+----------+--------------+ PERO     Full                                                        +---------+---------------+---------+-----------+----------+--------------+     Summary:  RIGHT: - No evidence of common femoral vein obstruction.   LEFT: - There is no evidence of deep vein thrombosis in the lower extremity.  No cystic structure found in the popliteal fossa.   *See table(s) above for measurements and observations.  Electronically  signed by Deitra Mayo MD on 11/14/2020 at 12:00:25 PM.    Final      ECG - SR rate 73 BPM. (See cardiology reading for complete details)   PHYSICAL EXAM  Temp:  [97.7 F (36.5 C)-98.4 F (36.9 C)] 98.4 F (36.9 C) (09/04 1239) Pulse Rate:  [67-90] 90 (09/04 1239) Resp:  [19-20] 19 (09/04 1239) BP: (99-157)/(49-77) 99/49 (09/04 1239) SpO2:  [95 %-99 %] 95 % (09/04 1239)  General - Well nourished, well developed, in no apparent distress.  Ophthalmologic - fundi not visualized due to noncooperation.  Cardiovascular - Regular rhythm and rate.  Neuro - Alert, oriented to place, person, situation, but not date.  No aphasia, fluent language, following all simple commands, mild dysarthria. Able to name and repeat. No gaze palsy, tracking bilaterally, visual field full, PERRL. Left mild facial droop. Tongue midline. RUE 5/5. LUE with contracture of elbow and wrist and fingers in flexion position. RLE 3/5 proximal and 4/5 distally. LLE proximal 2-/5, distal 2/5. Sensation symmetrical bilaterally, right FTN intact, gait not tested.   ASSESSMENT/PLAN Mr. JAVEL CARRANCO is a 85 y.o. male with history of  single episode of postoperative atrial fibrillation, coronary artery disease, CHF, diabetes, hypertension, hyperlipidemia, prior lacunar stroke with residual plegia and left upper extremity and significant weakness in left lower extremity, history of peripheral vascular disease, and a history of seizure disorder who was brought in by EMS  as a stroke code for right-sided weakness, speech had been incoherent and he had been mumbling.  ? TIA in the setting of left ICA stenosis and possible syncope / presyncope due to dehydration Diarrhea earlier yesterday Daughter stated that patient has frequent urine and large urine output at baseline at home BP lower than normal even with lying down on the stretcher CTA head and neck showed left ICA 60 to 70% stenosis (comparable with 07/2017) Recommend BP  monitoring at home Recommend to avoid dehydration  Incidental stroke: Two punctate foci of acute ischemia within the subcortical right frontal lobe - embolic pattern, source unclear CT Head - No acute intracranial abnormality. MRI head - Two punctate foci of acute ischemia within the subcortical right frontal lobe, 1 of which is along the right precentral gyrus. No hemorrhage or mass effect. Advanced white matter disease most consistent with chronic small vessel ischemia.  MRA head - Normal intracranial MRA. CTA Neck - Cervical carotid atherosclerosis with 60% narrowing at the left ICA origin. 60% bilateral vertebral origin stenosis.  EEG - There was no seizure or seizure predisposition recorded Bilateral LE venous Dopplers - no DVT 2D Echo - EF 60 - 65%. No cardiac source of emboli identified.  Sars Corona Virus 2 - negative LDL - 52 HgbA1c - 7.8 VTE prophylaxis - Eliquis aspirin 81 mg daily and Eliquis (apixaban) daily prior to admission, was on aspirin 81 mg daily and Eliquis (apixaban) daily. Discussed with daughter who would likely to switch back to ASA and plavix Patient will be counseled to be compliant with his antithrombotic medications Ongoing aggressive stroke risk factor management Therapy recommendations: CIR Disposition:  Pending, family prefers to try for CIR here per our discussion today. Discussed with Clemens Catholic.   History of stroke 08/2014 right frontal small infarct, and old thalamic infarct.  Carotid Doppler negative.  A1c 7.0, EF 45 to 50%, TCD emboli detection negative, 30-day cardiac event monitor showed no A. fib, discharged on aspirin 03/2017 admitted for confusion and speech difficulty, status post tPA, CT head and neck negative.  MRI negative for acute infarct.  Concerning for seizure versus TIA at the time. 07/2017 admitted for left facial droop and slurred speech as well as left-sided weakness.  CT no acute abnormality.  CT head and neck showed left ICA 70%  stenosis.  MRI showed right PLIC infarct.  2D echo EF normal range, LDL 130, A1c 6.0, patient discharged with DAPT and Lipitor 80. Baseline left hemiplegia with contracture, [facility]  Anticoagulation Postop A. Fib Patient history of CABG followed by postop A. fib, but no evidence of recurrence.  08/2014 30-day cardiac event monitoring showed no A. fib. In 10/2020 patient follow with [facility], found to have LE edema, given hx of afib, pt was started on Eliquis.  However later on LE venous Doppler no DVT Current admission LE venous Doppler no DVT He was on Eliquis and ASA PTA but after family discussion with daughter, Alta Kenion, and she prefer to switch to DAPT w/ASA and Plavix. We feel it is reasonable since no clear evidence of afib recurrence and there is no DVT at this time  Seizure History of questionable seizure in 2005 and 2016 Strong family history of seizure Put on Depakote by PCP in 2016 Follow with Dr. Delice Lesch at [place]  Carotid stenosis CTA head and neck in 07/2017 showed left ICA 70% stenosis This admission left ICA 60 to 70% stenosis on CTA head and neck May  related to current symptoms in the setting of dehydration Recommend outpatient follow-up with vascular surgery for continued monitoring  Hypertension Home BP meds: Cozaar ; Toprol Stable now Avoid low BP Long-term BP goal 130-150 given carotid stenosis  Hyperlipidemia Home Lipid lowering medication: Lipitor 80 mg daily ; Zetia ; Niaspan LDL 52, goal < 70 Current lipid lowering medication: Lipitor 80 mg daily and Zetia Continue statin at discharge  Diabetes Home diabetic meds: insulin ; Tradjenta ; Jaediance Current diabetic meds: insulin HgbA1c 7.8, goal < 7.0 SSI CBG monitoring Close PCP follow-up for better DM control  Other Stroke Risk Factors Advanced age Former cigarette smoker - quit Family hx stroke (mother)  Coronary artery disease status post CABG PVD  Other Active  Problems Code status - Full code  Neurology Stroke Team will sign off.  Charlene Brooke, NP-C  ATTENDING NOTE: I reviewed above note and agree with the assessment and plan. Pt was seen and examined.   Pt lying in bed at his baseline. No family at the bedside. Neuro unchanged. Our NP Delila talked with daughter again about antithrombotic regimen, daughter would like ASA and plavix instead of eliquis and ASA. Given pt has only brief post op afib in the past, and 2016 30 day cardiac event monitoring negative for afib, and no evidence of afib recurrence or DVT at this time, I felt reasonable to d/c eliquis and switch back to ASA and plavix. [facility] but also will follow up with neurology Dr. Delice [facility]ch at [facility] in 4 weeks.   For detailed assessment and plan, please refer to above as I have made changes wherever appropriate.   Neurology will sign off. Please call with questions. Pt will follow up with Dr. Delice Lesch at [facility] in about 4 weeks. Thanks for the consult.   Rosalin Hawking, MD PhD Stroke Neurology 11/15/2020 4:45 PM     To contact Stroke Continuity provider, please refer to [email or web address] After hours, contact General Neurology

## 2020-11-16 DIAGNOSIS — E1159 Type 2 diabetes mellitus with other circulatory complications: Secondary | ICD-10-CM | POA: Diagnosis not present

## 2020-11-16 DIAGNOSIS — E118 Type 2 diabetes mellitus with unspecified complications: Secondary | ICD-10-CM | POA: Diagnosis not present

## 2020-11-16 DIAGNOSIS — I152 Hypertension secondary to endocrine disorders: Secondary | ICD-10-CM | POA: Diagnosis not present

## 2020-11-16 DIAGNOSIS — I639 Cerebral infarction, unspecified: Secondary | ICD-10-CM | POA: Diagnosis not present

## 2020-11-16 LAB — GLUCOSE, CAPILLARY
Glucose-Capillary: 187 mg/dL — ABNORMAL HIGH (ref 70–99)
Glucose-Capillary: 190 mg/dL — ABNORMAL HIGH (ref 70–99)
Glucose-Capillary: 174 mg/dL — ABNORMAL HIGH (ref 70–99)
Glucose-Capillary: 128 mg/dL — ABNORMAL HIGH (ref 70–99)

## 2020-11-16 MED ORDER — METOPROLOL SUCCINATE ER 25 MG PO TB24
12.5000 mg | ORAL_TABLET | Freq: Every day | ORAL | Status: DC
  Administered 2020-11-18 – 2020-12-01 (×14): 12.5 mg via ORAL
  Filled 2020-11-16 (×16): qty 1

## 2020-11-16 MED ORDER — INSULIN GLARGINE-YFGN 100 UNIT/ML ~~LOC~~ SOLN
24.0000 [IU] | Freq: Every day | SUBCUTANEOUS | Status: DC
  Administered 2020-11-16 – 2020-11-25 (×10): 24 [IU] via SUBCUTANEOUS
  Filled 2020-11-16 (×13): qty 0.24

## 2020-11-16 NOTE — Evaluation (Signed)
Occupational Therapy Evaluation Patient Details Name: Fred [name] MRN: [id number] DOB: [birth year] Today's Date: 11/16/2020    History of Present Illness Pt is an 85 y/o male admitted 9/2 secondary to new onset R weakness. Found to have R frontal lobe infarct. 9/3 rapid response contacted for confusion and disorientation. PMH includes HTN, DM, seizures, a fib, CVA with L sided weakness, CHF, and CAD.   Clinical Impression   Patient admitted for the diagnosis above.  PTA he lives alone, but has a family member that stays with him at nighttime.  He is largely at wheelchair level for mobility, required assist with showers, but was able to complete sink baths Mod I at wheelchair level.  Deficits impacting independence are listed below.  Curently he is needing up to Mod A for stand pivot transfer, and needing increased assist with ADL at sit level.  OT to follow in the acute setting, but CIR is recommended to assist him in reaching his prior level of function, and return home.        Follow Up Recommendations  CIR    Equipment Recommendations  None recommended by OT    Recommendations for Other Services Rehab consult     Precautions / Restrictions Precautions Precautions: Fall Restrictions Weight Bearing Restrictions: No      Mobility Bed Mobility Overal bed mobility: Needs Assistance Bed Mobility: Supine to Sit     Supine to sit: Mod assist       Patient Response: Cooperative  Transfers Overall transfer level: Needs assistance Equipment used: None Transfers: Sit to/from [name] Sit to Stand: Mod assist   Squat pivot transfers: Mod assist          Balance Overall balance assessment: Needs assistance Sitting-balance support: Single extremity supported;Feet supported Sitting balance-Leahy Scale: Fair     Standing balance support: Single extremity supported Standing balance-Leahy Scale: Poor                             ADL either  performed or assessed with clinical judgement   ADL       Grooming: Wash/dry hands;Sitting;Min guard           Upper Body Dressing : Minimal assistance;Sitting   Lower Body Dressing: Moderate assistance;Sitting/lateral leans   Toilet Transfer: Moderate assistance;Squat-pivot                   Vision Patient Visual Report: No change from baseline       Perception     Praxis      Pertinent Vitals/Pain Pain Assessment: No/denies pain     Hand Dominance Right   Extremity/Trunk Assessment Upper Extremity Assessment Upper Extremity Assessment: LUE deficits/detail LUE Deficits / Details: non functional limb due to prior CVA LUE Sensation: WNL LUE Coordination: decreased fine motor;decreased gross motor   Lower Extremity Assessment Lower Extremity Assessment: Defer to PT evaluation   Cervical / Trunk Assessment Cervical / Trunk Assessment: Kyphotic   Communication Communication Communication: No difficulties   Cognition Arousal/Alertness: Awake/alert Behavior During Therapy: WFL for tasks assessed/performed Overall Cognitive Status: No family/caregiver present to determine baseline cognitive functioning                                                Home Living Family/patient expects to be discharged to::  Private residence Living Arrangements: Alone Available Help at Discharge: Family;Available 24 hours/day Type of Home: House Home Access: Ramped entrance     Home Layout: Two level;Able to live on main level with bedroom/bathroom     Bathroom Shower/Tub: Occupational psychologist: Standard Bathroom Accessibility: Yes How Accessible: Accessible via walker Home Equipment: Wheelchair - power;Shower seat - built in      Lives With: Alone    Prior Functioning/Environment Level of Independence: Needs assistance  Gait / Transfers Assistance Needed: Reports independence with transfers to/from [facility]. Reports some falls ADL's /  Homemaking Assistance Needed: Reports he needs assist with shower transfers.            OT Problem List: Decreased strength;Decreased range of motion;Impaired balance (sitting and/or standing);Impaired UE functional use      OT Treatment/Interventions: Self-care/ADL training;Balance training;Patient/family education;Therapeutic activities    OT Goals(Current goals can be found in the care plan section) Acute Rehab OT Goals Patient Stated Goal: get back to where I was OT Goal Formulation: With patient Time For Goal Achievement: 11/30/20 Potential to Achieve Goals: Good ADL Goals Pt Will Perform Grooming: with set-up;sitting Pt Will Perform Upper Body Bathing: with set-up;sitting Pt Will Perform Upper Body Dressing: with set-up;sitting Pt Will Transfer to Toilet: with supervision;squat pivot transfer Pt/caregiver will Perform Home Exercise Program: Increased strength;Right Upper extremity;With theraband;With Supervision;With written HEP provided  OT Frequency: Min 2X/week   Barriers to D/C:  None noted          Co-evaluation              AM-PAC OT "6 Clicks" Daily Activity     Outcome Measure Help from another person eating meals?: A Little Help from another person taking care of personal grooming?: A Little Help from another person toileting, which includes using toliet, bedpan, or urinal?: A Lot Help from another person bathing (including washing, rinsing, drying)?: A Lot Help from another person to put on and taking off regular upper body clothing?: A Little Help from another person to put on and taking off regular lower body clothing?: A Lot 6 Click Score: 15   End of Session Equipment Utilized During Treatment: Gait belt  Activity Tolerance: Patient tolerated treatment well Patient left: in chair;with call bell/phone within reach  OT Visit Diagnosis: Unsteadiness on feet (R26.81);Muscle weakness (generalized) (M62.81);Hemiplegia and hemiparesis Hemiplegia -  Right/Left: Left Hemiplegia - dominant/non-dominant: Non-Dominant Hemiplegia - caused by: Cerebral infarction                [id number] OT Time Calculation (min): 15 min Charges:  OT General Charges $OT Visit: 1 Visit OT Evaluation $OT Eval Moderate Complexity: 1 Mod  11/16/2020  RP, OTR/L  Acute Rehabilitation Services  Office:  [phone]   Metta Clines 11/16/2020, 9:00 AM

## 2020-11-16 NOTE — Progress Notes (Signed)
Inpatient Rehab Admissions Coordinator:   I spoke with pt's daughter Butch Penny regarding potential CIR admission again. She wants to pursue CIR using [facility] Medicare policy. She is aware that the plan would have to be to take him home with 24/7 support from family. I will open a case with his insurance and pursue for admission pending insurance auth and bed availability.   Clemens Catholic, [place], [place] Admissions Coordinator  [phone] ([place]) [phone] (office)

## 2020-11-16 NOTE — Progress Notes (Addendum)
[facility] PROGRESS NOTE   Fred [name] [id number] DOB: 06-08-1935 DOA: 11/13/2020  PCP: [name], MD  Brief History/Interval Summary: 85 y.o. male with medical history significant for history of CVA with residual left hemiplegia, wheelchair dependent, CAD s/p CABG, insulin-dependent T2DM, HTN, HLD, seizure disorder on Depakote, depression/anxiety, and single episode of postoperative atrial fibrillation who presented to the ED for evaluation of right-sided weakness.  Symptoms apparently began about 2 days prior to admission.  Patient was evaluated in the emergency department.  Underwent MRI which actually showed right-sided stroke.  Hospitalize for further management.  Patient was previously on aspirin and Plavix but recently taken off of Plavix and started on Eliquis by his cardiologist at the [facility].  This was for a diagnosis of atrial fibrillation.     Consultants: Neurology  Procedures: None  Antibiotics: Anti-infectives (From admission, onward)    None       Subjective/Interval History: Patient mentions that overall he feels well.  Denies any new complaints.  No headache.  No nausea vomiting or shortness of breath.     Assessment/Plan:  Acute CVA MRI showed acute infarct in the right cerebral hemisphere.  Patient came in with right-sided weakness.  Patient has prior history of CVA with residual left-sided hemiplegia.  Also was noted to have slurred speech.   Echocardiogram as below.  It showed normal systolic function.  Grade 1 diastolic dysfunction.  Mild LVH was noted.  Patient was initially continued on Eliquis and aspirin.  After discussions between patient and family and neurology and review of his previous studies patient has been transitioned to aspirin and Plavix. Noted to be on atorvastatin as well.  LDL noted to be 52.  HbA1c 7.8.   CT angiogram head and neck does 62 presents to notices left ICA and 60% vertebral artery stenosis.  Neurology  recommends follow-up with vascular surgery in the outpatient setting.   Patient also underwent EEG which did not show any epileptiform activity Seen by PT and OT and SLP.  Inpatient rehabilitation was recommended.  Await evaluation by rehabilitation.  History of paroxysmal atrial fibrillation Recently started on Eliquis by his cardiologist at the [facility]. however apparently patient's 30-day monitor did not show any evidence for atrial fibrillation.  Initial episode of atrial fibrillation was in the setting of CABG in the postoperative period.  Urology feels patient would be better served by being on dual antiplatelet treatment.  So he has been changed over to aspirin and Plavix after discussions between neurology and the patient and patient's daughter.   Metoprolol is currently on hold for permissive hypertension.  Could be resumed in the next 24 hours.  Left lower extremity edema Dopplers negative for DVT.  Most likely dependent edema.  Elevated creatinine Creatinine was noted to be 1.3 on admission.  Subsequently noted to be normal.  Monitor urine output.  Losartan on hold.    Essential hypertension Permissive hypertension is being allowed.  Losartan and metoprolol were placed on hold.  Blood pressure noted to be on the low side.  Continue to hold losartan.  May resume metoprolol at a lower dose from tomorrow.  Diabetes mellitus type 2, insulin-dependent Noted to be on Toujeo at home.  Currently on glargine insulin.  HbA1c is 7.8.  CBGs noted to be slightly elevated over the last 24 hours.  May benefit from higher dose of glargine at bedtime.  However at the same time patient is elderly and we would not want to achieve a  strict control of this individual.  We will increase it by 2 units. Home medication list so that he is also supposed to be on Jardiance and Tradjenta prior to admission.  These are currently on hold.  May be resumed at discharge.  History of coronary artery disease status post  CABG Stable.  Continue aspirin, statin, Zetia.  History of seizure disorder Continue Depakote.  Hyperlipidemia Continue statin and Zetia.  History of depression and anxiety Continue Lexapro doxepin.     DVT Prophylaxis: On Eliquis Code Status: Full code Family Communication: Discussed with the patient.  Discussed with daughter yesterday. Disposition Plan: CIR as recommended by therapy  Status is: Inpatient  Remains inpatient appropriate because:Inpatient level of care appropriate due to severity of illness  Dispo:  Patient From: Home  Planned Disposition: CIR  Medically stable for discharge: Yes           Medications: Scheduled:   stroke: mapping our early stages of recovery book   Does not apply Once   allopurinol  300 mg Oral Daily   aspirin EC  81 mg Oral Daily   atorvastatin  80 mg Oral Daily   baclofen  10 mg Oral BID   clopidogrel  75 mg Oral Daily   divalproex  500 mg Oral QHS   doxepin  25 mg Oral QHS   escitalopram  10 mg Oral Daily   ezetimibe  10 mg Oral Daily   insulin aspart  0-9 Units Subcutaneous TID WC   insulin glargine-yfgn  22 Units Subcutaneous QHS   Continuous: [id number] **OR** acetaminophen (TYLENOL) oral liquid 160 mg/5 mL **OR** acetaminophen, senna-docusate   Objective:  Vital Signs  Vitals:   11/15/20 0418 11/15/20 1239 11/15/20 2138 11/16/20 0326  BP: 131/67 (!) 99/49 (!) 126/52 (!) 121/56  Pulse: 67 90  79  Resp: '20 19 18 18  '$ Temp: 97.7 F (36.5 C) 98.4 F (36.9 C) 98.3 F (36.8 C) 98 F (36.7 C)  TempSrc: Axillary Oral Oral Oral  SpO2: 99% 95% 96% 96%  Weight:      Height:        Intake/Output Summary (Last 24 hours) at 11/16/2020 1029 Last data filed at 11/16/2020 0916 Gross per 24 hour  Intake 120 ml  Output 1875 ml  Net -1755 ml    Filed Weights   11/13/20 1900 11/13/20 2043  Weight: 82.6 kg 74.8 kg    General appearance: Awake alert.  In no distress Resp: Clear to auscultation bilaterally.   Normal effort Cardio: S1-S2 is normal regular.  No S3-S4.  No rubs murmurs or bruit GI: Abdomen is soft.  Nontender nondistended.  Bowel sounds are present normal.  No masses organomegaly Extremities: No edema.   Neurologic: Left hemiparesis from previous stroke.    Lab Results:  Data Reviewed: I have personally reviewed following labs and imaging studies  CBC: Recent Labs  Lab 11/13/20 1927 11/13/20 1933 11/15/20 0101  WBC 9.4  --  7.7  NEUTROABS 7.0  --   --   HGB 13.7 14.6 13.1  HCT 42.1 43.0 38.4*  MCV 98.4  --  95.3  PLT 190  --  167     Basic Metabolic Panel: Recent Labs  Lab 11/13/20 1927 11/13/20 1933 11/15/20 0101  NA 133* 136 137  K 4.7 4.7 4.0  CL 101 102 106  CO2 24  --  26  GLUCOSE 237* 226* 221*  BUN '16 17 15  '$ CREATININE 1.30* 1.20 1.20  CALCIUM 8.9  --  8.7*     GFR: Estimated Creatinine Clearance: 42.3 mL/min (by C-G formula based on SCr of 1.2 mg/dL).  Liver Function Tests: Recent Labs  Lab 11/13/20 1927  AST 21  ALT 20  ALKPHOS 59  BILITOT 0.6  PROT 6.5  ALBUMIN 3.2*      Coagulation Profile: Recent Labs  Lab 11/13/20 1927  INR 1.3*      HbA1C: Recent Labs    11/14/20 0353  HGBA1C 7.8*     CBG: Recent Labs  Lab 11/15/20 1242 11/15/20 1352 11/15/20 1552 11/15/20 2141 11/16/20 0735  GLUCAP 189* 177* 228* 247* 187*     Lipid Profile: Recent Labs    11/14/20 0353  CHOL 98  HDL 28*  LDLCALC 52  TRIG 92  CHOLHDL 3.5       Recent Results (from the past 240 hour(s))  Resp Panel by RT-PCR (Flu A&B, Covid) Nasopharyngeal Swab     Status: None   Collection Time: 11/13/20  7:39 PM   Specimen: Nasopharyngeal Swab; Nasopharyngeal(NP) swabs in vial transport medium  Result Value Ref Range Status   SARS Coronavirus 2 by RT PCR NEGATIVE NEGATIVE Final    Comment: (NOTE) SARS-CoV-2 target nucleic acids are NOT DETECTED.  The SARS-CoV-2 RNA is generally detectable in upper respiratory specimens during the  acute phase of infection. The lowest concentration of SARS-CoV-2 viral copies this assay can detect is 138 copies/mL. A negative result does not preclude SARS-Cov-2 infection and should not be used as the sole basis for treatment or other patient management decisions. A negative result may occur with  improper specimen collection/handling, submission of specimen other than nasopharyngeal swab, presence of viral mutation(s) within the areas targeted by this assay, and inadequate number of viral copies(<138 copies/mL). A negative result must be combined with clinical observations, patient history, and epidemiological information. The expected result is Negative.  Fact Sheet for Patients:  EntrepreneurPulse.com.au  Fact Sheet for Healthcare Providers:  IncredibleEmployment.be  This test is no t yet approved or cleared by the [place] FDA and  has been authorized for detection and/or diagnosis of SARS-CoV-2 by FDA under an Emergency Use Authorization (EUA). This EUA will remain  in effect (meaning this test can be used) for the duration of the COVID-19 declaration under Section 564(b)(1) of the Act, 21 U.S.C.section 360bbb-3(b)(1), unless the authorization is terminated  or revoked sooner.       Influenza A by PCR NEGATIVE NEGATIVE Final   Influenza B by PCR NEGATIVE NEGATIVE Final    Comment: (NOTE) The Xpert Xpress SARS-CoV-2/FLU/RSV plus assay is intended as an aid in the diagnosis of influenza from Nasopharyngeal swab specimens and should not be used as a sole basis for treatment. Nasal washings and aspirates are unacceptable for Xpert Xpress SARS-CoV-2/FLU/RSV testing.  Fact Sheet for Patients: EntrepreneurPulse.com.au  Fact Sheet for Healthcare Providers: IncredibleEmployment.be  This test is not yet approved or cleared by the [place] FDA and has been authorized for detection and/or  diagnosis of SARS-CoV-2 by FDA under an Emergency Use Authorization (EUA). This EUA will remain in effect (meaning this test can be used) for the duration of the COVID-19 declaration under Section 564(b)(1) of the Act, 21 U.S.C. section 360bbb-3(b)(1), unless the authorization is terminated or revoked.  Performed at [facility], [place] [address], [place], [place] [address]        Radiology Studies: CT ANGIO NECK W OR WO CONTRAST  Result Date: 11/14/2020 CLINICAL DATA:  Stroke workup EXAM: CT  ANGIOGRAPHY NECK TECHNIQUE: Multidetector CT imaging of the neck was performed using the standard protocol during bolus administration of intravenous contrast. Multiplanar CT image reconstructions and MIPs were obtained to evaluate the vascular anatomy. Carotid stenosis measurements (when applicable) are obtained utilizing NASCET criteria, using the distal internal carotid diameter as the denominator. CONTRAST:  12m OMNIPAQUE IOHEXOL 350 MG/ML SOLN COMPARISON:  Brain MRI and MRA from yesterday and today respectively FINDINGS: Aortic arch: Atheromatous plaque.  Prior CABG. Right carotid system: Diffuse atherosclerosis that diffuse intermittent atheromatous plaque of the common carotid and proximal ICA. No flow limiting stenosis of 50% or greater. Moderate plaque irregularity without dissection or clear ulceration. Left carotid system: Atheromatous plaque diffusely involving the common carotid which is small compared to the right. Extensive plaque at the bifurcation and proximal ICA with proximal ICA stenosis measuring 60%. No dissection. ICA tortuosity with looping below the skull base. Vertebral arteries: No proximal subclavian stenosis of hemodynamic significance. Calcified plaque at both vertebral origins with 60% stenosis on both sides as measured on coronal reformats. Negative for dissection or beading. Skeleton: Cervical spine degeneration with reversed lordosis and C4-5, C5-6 and mild anterolisthesis.  Other neck: No acute finding Upper chest: Airway thickening IMPRESSION: 1. Cervical carotid atherosclerosis with 60% narrowing at the left ICA origin. 2. 60% bilateral vertebral origin stenosis. Electronically Signed   By: JMonte FantasiaM.D.   On: 11/14/2020 11:44   EEG adult  Result Date: 11/14/2020 KGreta Doom MD     11/14/2020  2:07 PM History: 85yo M With right sided weakness and confusion Sedation: none Technique: This EEG was acquired with electrodes placed according to the International 10-20 electrode system (including Fp1, Fp2, F3, F4, C3, C4, P3, P4, O1, O2, T3, T4, T5, T6, A1, A2, Fz, Cz, Pz). The following electrodes were missing or displaced: none. Background: The background consists of intermixed generalized irregular delta and theta range activities.  There is no definite posterior dominant rhythm seen.  There are occasional brief runs of beta range activity consistent with spindles.  There are times where there is slightly faster underlying background, but no definite PDR. Photic stimulation: Physiologic driving is not performed EEG Abnormalities: 1) lack of waking recording Clinical Interpretation: This EEG was recorded in the drowsy and sleeping state, the lack of waking state limits the ability to assess for encephalopathy. There was no seizure or seizure predisposition recorded on this study. Please note that lack of epileptiform activity on EEG does not preclude the possibility of epilepsy. MRoland Rack MD [facility] [phone] 7pm- 7am, please page neurology on call as listed in [place]   ECHOCARDIOGRAM COMPLETE  Result Date: 11/14/2020    ECHOCARDIOGRAM REPORT   Patient Name:   WMIR[facility] BIONDOHPremier Physicians [facility] IncDate of Exam: 11/14/2020 Medical Rec #:  [id number]       Height:       63.0 in Accession #:    [id number]      Weight:       165.0 lb Date of Birth:  [birth year]       BSA:          1.782 m Patient Age:    89years         BP:           122/67 mmHg Patient  Gender: M                HR:           64  bpm. Exam Location:  Inpatient Procedure: 2D Echo, Cardiac Doppler and Color Doppler Indications:    Stroke  History:        Patient has prior history of Echocardiogram examinations, most                 recent 08/10/2017. CHF, CAD, Prior CABG, Stroke,                 Arrythmias:Atrial Fibrillation; Risk Factors:Hypertension,                 Diabetes and Dyslipidemia.  Sonographer:    Wenda Low Referring Phys: [id number] Barton  1. Left ventricular ejection fraction, by estimation, is 60 to 65%. The left ventricle has normal function. The left ventricle has no regional wall motion abnormalities. There is mild left ventricular hypertrophy. Left ventricular diastolic parameters are consistent with Grade I diastolic dysfunction (impaired relaxation).  2. Right ventricular systolic function is normal. The right ventricular size is normal. There is normal pulmonary artery systolic pressure.  3. Left atrial size was moderately dilated.  4. The mitral valve is normal in structure. No evidence of mitral valve regurgitation. No evidence of mitral stenosis.  5. The aortic valve is normal in structure. Aortic valve regurgitation is trivial. Mild to moderate aortic valve sclerosis/calcification is present, without any evidence of aortic stenosis.  6. The inferior vena cava is normal in size with greater than 50% respiratory variability, suggesting right atrial pressure of 3 mmHg. Conclusion(s)/Recommendation(s): No intracardiac source of embolism detected on this transthoracic study. A transesophageal echocardiogram is recommended to exclude cardiac source of embolism if clinically indicated. FINDINGS  Left Ventricle: Left ventricular ejection fraction, by estimation, is 60 to 65%. The left ventricle has normal function. The left ventricle has no regional wall motion abnormalities. The left ventricular internal cavity size was normal in size. There is  mild left  ventricular hypertrophy. Left ventricular diastolic parameters are consistent with Grade I diastolic dysfunction (impaired relaxation). Right Ventricle: The right ventricular size is normal. No increase in right ventricular wall thickness. Right ventricular systolic function is normal. There is normal pulmonary artery systolic pressure. The tricuspid regurgitant velocity is 2.55 m/s, and  with an assumed right atrial pressure of 3 mmHg, the estimated right ventricular systolic pressure is 0000000 mmHg. Left Atrium: Left atrial size was moderately dilated. Right Atrium: Right atrial size was normal in size. Pericardium: There is no evidence of pericardial effusion. Mitral Valve: The mitral valve is normal in structure. No evidence of mitral valve regurgitation. No evidence of mitral valve stenosis. Tricuspid Valve: The tricuspid valve is normal in structure. Tricuspid valve regurgitation is not demonstrated. No evidence of tricuspid stenosis. Aortic Valve: The aortic valve is normal in structure. Aortic valve regurgitation is trivial. Mild to moderate aortic valve sclerosis/calcification is present, without any evidence of aortic stenosis. Aortic valve mean gradient measures 6.0 mmHg. Aortic valve peak gradient measures 11.4 mmHg. Aortic valve area, by VTI measures 1.58 cm. Pulmonic Valve: The pulmonic valve was normal in structure. Pulmonic valve regurgitation is not visualized. No evidence of pulmonic stenosis. Aorta: The aortic root is normal in size and structure. Venous: The inferior vena cava is normal in size with greater than 50% respiratory variability, suggesting right atrial pressure of 3 mmHg. IAS/Shunts: No atrial level shunt detected by color flow Doppler.  LEFT VENTRICLE PLAX 2D LVIDd:         3.80 cm     Diastology LVIDs:  2.60 cm     LV e' medial:    6.20 cm/s LV PW:         1.20 cm     LV E/e' medial:  15.8 LV IVS:        1.10 cm     LV e' lateral:   8.05 cm/s LVOT diam:     2.00 cm     LV E/e'  lateral: 12.2 LV SV:         62 LV SV Index:   35 LVOT Area:     3.14 cm  LV Volumes (MOD) LV vol d, MOD A2C: 64.0 ml LV vol d, MOD A4C: 77.4 ml LV vol s, MOD A2C: 25.8 ml LV vol s, MOD A4C: 30.0 ml LV SV MOD A2C:     38.2 ml LV SV MOD A4C:     77.4 ml LV SV MOD BP:      44.4 ml RIGHT VENTRICLE RV Basal diam:  3.15 cm RV Mid diam:    3.80 cm RV S prime:     8.39 cm/s TAPSE (M-mode): 1.7 cm LEFT ATRIUM             Index       RIGHT ATRIUM           Index LA diam:        4.90 cm 2.75 cm/m  RA Area:     11.90 cm LA Vol (A2C):   56.0 ml 31.43 ml/m RA Volume:   21.40 ml  12.01 ml/m LA Vol (A4C):   57.5 ml 32.27 ml/m LA Biplane Vol: 58.6 ml 32.89 ml/m  AORTIC VALVE AV Area (Vmax):    1.59 cm AV Area (Vmean):   1.56 cm AV Area (VTI):     1.58 cm AV Vmax:           169.00 cm/s AV Vmean:          115.000 cm/s AV VTI:            0.392 m AV Peak Grad:      11.4 mmHg AV Mean Grad:      6.0 mmHg LVOT Vmax:         85.50 cm/s LVOT Vmean:        57.100 cm/s LVOT VTI:          0.197 m LVOT/AV VTI ratio: 0.50 MITRAL VALVE                TRICUSPID VALVE MV Area (PHT): 3.37 cm     TR Peak grad:   26.0 mmHg MV Decel Time: 225 msec     TR Vmax:        255.00 cm/s MV E velocity: 98.10 cm/s MV A velocity: 118.00 cm/s  SHUNTS MV E/A ratio:  0.83         Systemic VTI:  0.20 m                             Systemic Diam: 2.00 cm Candee Furbish MD Electronically signed by Candee Furbish MD Signature Date/Time: 11/14/2020/3:42:26 PM    Final    VAS [place] LOWER EXTREMITY VENOUS (DVT)  Result Date: 11/14/2020  Lower Venous DVT Study Patient Name:  BAPTISTE ODENS [facility]  Date of Exam:   11/14/2020 Medical Rec #: [id number]         Accession #:    [id number] Date of Birth: [birth year]  Patient Gender: M Patient Age:   36 years Exam Location:  [facility] Procedure:      VAS [place] LOWER EXTREMITY VENOUS (DVT) Referring Phys: VISHAL PATEL --------------------------------------------------------------------------------  Indications: Edema.   Limitations: Poor ultrasound/tissue interface, body habitus and restricted mobility. Comparison Study: No prior study Performing Technologist: Maudry Mayhew MHA, RDMS, RVT, RDCS  Examination Guidelines: A complete evaluation includes B-mode imaging, spectral Doppler, color Doppler, and power Doppler as needed of all accessible portions of each vessel. Bilateral testing is considered an integral part of a complete examination. Limited examinations for reoccurring indications may be performed as noted. The reflux portion of the exam is performed with the patient in reverse Trendelenburg.  +-----+---------------+---------+-----------+----------+--------------+ RIGHTCompressibilityPhasicitySpontaneityPropertiesThrombus Aging +-----+---------------+---------+-----------+----------+--------------+ CFV  Full           Yes      Yes                                 +-----+---------------+---------+-----------+----------+--------------+   +---------+---------------+---------+-----------+----------+--------------+ LEFT     CompressibilityPhasicitySpontaneityPropertiesThrombus Aging +---------+---------------+---------+-----------+----------+--------------+ CFV      Full           Yes      Yes                                 +---------+---------------+---------+-----------+----------+--------------+ SFJ      Full                                                        +---------+---------------+---------+-----------+----------+--------------+ FV Prox  Full                                                        +---------+---------------+---------+-----------+----------+--------------+ FV Mid   Full                                                        +---------+---------------+---------+-----------+----------+--------------+ FV DistalFull                                                        +---------+---------------+---------+-----------+----------+--------------+  PFV      Full                                                        +---------+---------------+---------+-----------+----------+--------------+ POP      Full           Yes      Yes                                 +---------+---------------+---------+-----------+----------+--------------+  PTV      Full                                                        +---------+---------------+---------+-----------+----------+--------------+ PERO     Full                                                        +---------+---------------+---------+-----------+----------+--------------+    Summary: RIGHT: - No evidence of common femoral vein obstruction.  LEFT: - There is no evidence of deep vein thrombosis in the lower extremity.  - No cystic structure found in the popliteal fossa.  *See table(s) above for measurements and observations. Electronically signed by Deitra Mayo MD on 11/14/2020 at 12:00:25 PM.    Final        LOS: 2 days   [facility] Pager on [email or web address]  11/16/2020, 10:29 AM

## 2020-11-16 NOTE — Progress Notes (Signed)
Physical Therapy Treatment Patient Details Name: Fred [name] MRN: [id number] DOB: 1935/12/26 Today's Date: 11/16/2020    History of Present Illness Pt is an 85 y/o male admitted 9/2 secondary to new onset R weakness. Found to have R frontal lobe infarct. 9/3 rapid response contacted for confusion and disorientation. PMH includes HTN, DM, seizures, a fib, CVA with L sided weakness, CHF, and CAD.    PT Comments    Pt tolerated treatment well with VSS throughout. Pt required decreased assist on transfers (min-mod +1) and performed sit to stands x5 with Stedy, maintaining standing position for approximately 20s each trial. RLE muscle testing in seated position revealed 4/5 for quad and hamstring strength. Trace contraction on quads in LLE. Continue to recommend CIR given pt's deficits and motivation to improve.    Follow Up Recommendations  CIR     Equipment Recommendations  None recommended by PT    Recommendations for Other Services       Precautions / Restrictions Precautions Precautions: Fall;Other (comment) Precaution Comments: left hemiplegia    Mobility  Bed Mobility               General bed mobility comments: pt in recliner on arrival. Min assist for sitting balance at EOB    Transfers Overall transfer level: Needs assistance   Transfers: Sit to/from Stand;Stand Pivot Transfers;Squat Pivot Transfers Sit to Stand: Mod assist Stand pivot transfers: Mod assist Squat pivot transfers: Mod assist     General transfer comment: mod assist to rise from chair with LLE blocked x 3 trials, Pivot chair to bed toward right with stand pivot and mod assist and +2 for safety. Squat pivot toward left with mod assist and +2 for safety. Min assist to rise from stedy pads x 5 trials max 25 sec standing tolerance  Ambulation/Gait             General Gait Details: nonambulatory at baseline   Stairs             Wheelchair Mobility    Modified Rankin (Stroke  Patients Only)       Balance                                            Cognition Arousal/Alertness: Awake/alert Behavior During Therapy: WFL for tasks assessed/performed Overall Cognitive Status: Impaired/Different from baseline Area of Impairment: Orientation                 Orientation Level: Disoriented to;Time                    Exercises General Exercises - Lower Extremity Long Arc Quad: AROM;Right;AAROM;Left;10 reps;Seated Hip Flexion/Marching: AROM;Right;10 reps;Seated    General Comments        Pertinent Vitals/Pain Pain Assessment: No/denies pain    Home Living                      Prior Function            PT Goals (current goals can now be found in the care plan section) Progress towards PT goals: Progressing toward goals    Frequency    Min 4X/week      PT Plan Current plan remains appropriate    Co-evaluation              AM-PAC PT "6 Clicks" Mobility  Outcome Measure  Help needed turning from your back to your side while in a flat bed without using bedrails?: A Lot Help needed moving from lying on your back to sitting on the side of a flat bed without using bedrails?: A Lot Help needed moving to and from a bed to a chair (including a wheelchair)?: A Lot Help needed standing up from a chair using your arms (e.g., wheelchair or bedside chair)?: A Lot Help needed to walk in hospital room?: Total Help needed climbing 3-5 steps with a railing? : Total 6 Click Score: 10    End of Session Equipment Utilized During Treatment: Gait belt Activity Tolerance: Patient tolerated treatment well Patient left: in chair;with call bell/phone within reach;with chair alarm set Nurse Communication: Mobility status PT Visit Diagnosis: Unsteadiness on feet (R26.81);Muscle weakness (generalized) (M62.81);Difficulty in walking, not elsewhere classified (R26.2)     Time: [id number] PT Time Calculation (min)  (ACUTE ONLY): 29 min  Charges:  $Therapeutic Activity: 23-37 mins                     Louie Casa, SPT Acute Rehab: [phone] [id number]     Domingo Dimes 11/16/2020, 1:07 PM

## 2020-11-17 DIAGNOSIS — I152 Hypertension secondary to endocrine disorders: Secondary | ICD-10-CM | POA: Diagnosis not present

## 2020-11-17 DIAGNOSIS — E118 Type 2 diabetes mellitus with unspecified complications: Secondary | ICD-10-CM | POA: Diagnosis not present

## 2020-11-17 DIAGNOSIS — I639 Cerebral infarction, unspecified: Secondary | ICD-10-CM | POA: Diagnosis not present

## 2020-11-17 DIAGNOSIS — E1159 Type 2 diabetes mellitus with other circulatory complications: Secondary | ICD-10-CM | POA: Diagnosis not present

## 2020-11-17 LAB — CBC
HCT: 37.5 % — ABNORMAL LOW (ref 39.0–52.0)
Hemoglobin: 12.7 g/dL — ABNORMAL LOW (ref 13.0–17.0)
MCH: 32.1 pg (ref 26.0–34.0)
MCHC: 33.9 g/dL (ref 30.0–36.0)
MCV: 94.7 fL (ref 80.0–100.0)
Platelets: 185 10*3/uL (ref 150–400)
RBC: 3.96 MIL/uL — ABNORMAL LOW (ref 4.22–5.81)
RDW: 14.4 % (ref 11.5–15.5)
WBC: 10.2 10*3/uL (ref 4.0–10.5)
nRBC: 0 % (ref 0.0–0.2)

## 2020-11-17 LAB — GLUCOSE, CAPILLARY
Glucose-Capillary: 91 mg/dL (ref 70–99)
Glucose-Capillary: 158 mg/dL — ABNORMAL HIGH (ref 70–99)
Glucose-Capillary: 178 mg/dL — ABNORMAL HIGH (ref 70–99)
Glucose-Capillary: 219 mg/dL — ABNORMAL HIGH (ref 70–99)

## 2020-11-17 LAB — BASIC METABOLIC PANEL
Anion gap: 8 (ref 5–15)
BUN: 14 mg/dL (ref 8–23)
CO2: 24 mmol/L (ref 22–32)
Calcium: 8.6 mg/dL — ABNORMAL LOW (ref 8.9–10.3)
Chloride: 106 mmol/L (ref 98–111)
Creatinine, Ser: 1.04 mg/dL (ref 0.61–1.24)
GFR, Estimated: >60 mL/min (ref >60)
Glucose, Bld: 144 mg/dL — ABNORMAL HIGH (ref 70–99)
Potassium: 3.6 mmol/L (ref 3.5–5.1)
Sodium: 138 mmol/L (ref 135–145)

## 2020-11-17 MED ORDER — ENOXAPARIN SODIUM 40 MG/0.4ML IJ SOSY
40.0000 mg | PREFILLED_SYRINGE | INTRAMUSCULAR | Status: DC
  Administered 2020-11-17 – 2020-12-01 (×15): 40 mg via SUBCUTANEOUS
  Filled 2020-11-17 (×15): qty 0.4

## 2020-11-17 MED ORDER — POTASSIUM CHLORIDE CRYS ER 20 MEQ PO TBCR
40.0000 meq | EXTENDED_RELEASE_TABLET | Freq: Once | ORAL | Status: AC
  Administered 2020-11-17: 40 meq via ORAL
  Filled 2020-11-17: qty 2

## 2020-11-17 NOTE — Progress Notes (Signed)
[facility] PROGRESS NOTE   Fred [name] [id number] DOB: 04/05/1935 DOA: 11/13/2020  PCP: [name], MD  Brief History/Interval Summary: 85 y.o. male with medical history significant for history of CVA with residual left hemiplegia, wheelchair dependent, CAD s/p CABG, insulin-dependent T2DM, HTN, HLD, seizure disorder on Depakote, depression/anxiety, and single episode of postoperative atrial fibrillation who presented to the ED for evaluation of right-sided weakness.  Symptoms apparently began about 2 days prior to admission.  Patient was evaluated in the emergency department.  Underwent MRI which actually showed right-sided stroke.  Hospitalize for further management.  Patient was previously on aspirin and Plavix but recently taken off of Plavix and started on Eliquis by his cardiologist at the [facility].  This was for a diagnosis of atrial fibrillation.     Consultants: Neurology  Procedures: None  Antibiotics: Anti-infectives (From admission, onward)    None       Subjective/Interval History: Patient denies any new complaints.  No shortness of breath nausea vomiting.     Assessment/Plan:  Acute CVA MRI showed acute infarct in the right cerebral hemisphere.  Patient came in with right-sided weakness.  Patient has prior history of CVA with residual left-sided hemiplegia.  Also was noted to have slurred speech.   Echocardiogram as below.  It showed normal systolic function.  Grade 1 diastolic dysfunction.  Mild LVH was noted.  Patient was initially continued on Eliquis and aspirin.  After discussions between patient and family and neurology and review of his previous studies patient has been transitioned to aspirin and Plavix. Noted to be on atorvastatin as well.  LDL noted to be 52.  HbA1c 7.8.   CT angiogram head and neck does 60 presents to notices left ICA and 60% vertebral artery stenosis.  Neurology recommends follow-up with vascular surgery in the  outpatient setting.   Patient also underwent EEG which did not show any epileptiform activity Seen by PT and OT and SLP.  Inpatient rehabilitation was recommended.  Await evaluation by rehabilitation.  History of paroxysmal atrial fibrillation Recently started on Eliquis by his cardiologist at the [facility]. however apparently patient's 30-day monitor did not show any evidence for atrial fibrillation.  Initial episode of atrial fibrillation was in the setting of CABG in the postoperative period.  Neurology feels patient would be better served by being on dual antiplatelet treatment.  So he has been changed over to aspirin and Plavix after discussions between neurology and the patient and patient's daughter.   Metoprolol is currently on hold for permissive hypertension.   Blood pressure noted to be low normal.  Metoprolol resumed at a lower dose.    Left lower extremity edema Dopplers negative for DVT.  Most likely dependent edema.  Elevated creatinine Creatinine was noted to be 1.3 on admission.  Subsequently noted to be normal.  Monitor urine output.  Losartan on hold.    Essential hypertension Lee permissive hypertension was allowed.  Losartan and metoprolol were placed on hold.  Blood pressure remains low normal.  Metoprolol resumed primarily for A. fib at a lower dose.  Continue to hold losartan.    Diabetes mellitus type 2, insulin-dependent Noted to be on Toujeo at home.  Currently on glargine insulin.  HbA1c is 7.8.   Dose of glargine was adjusted slightly yesterday.  CBG noted to be 91 this morning.  We will see how the trends are during the rest of the day and may need to cut back if it remains on the  lower side.   Home medication list shows that he is also supposed to be on Jardiance and Tradjenta prior to admission.  These are currently on hold.    History of coronary artery disease status post CABG Stable.  Continue aspirin, statin, Zetia.  History of seizure disorder Continue  Depakote.  Hyperlipidemia Continue statin and Zetia.  History of depression and anxiety Continue Lexapro doxepin.     DVT Prophylaxis: Since patient is no longer on Eliquis we will initiate Lovenox. Code Status: Full code Family Communication: Discussed with patient.  No family at bedside yesterday. Disposition Plan: CIR as recommended by therapy  Status is: Inpatient  Remains inpatient appropriate because:Inpatient level of care appropriate due to severity of illness  Dispo:  Patient From: Home  Planned Disposition: CIR  Medically stable for discharge: Yes           Medications: Scheduled:   stroke: mapping our early stages of recovery book   Does not apply Once   allopurinol  300 mg Oral Daily   aspirin EC  81 mg Oral Daily   atorvastatin  80 mg Oral Daily   baclofen  10 mg Oral BID   clopidogrel  75 mg Oral Daily   divalproex  500 mg Oral QHS   doxepin  25 mg Oral QHS   escitalopram  10 mg Oral Daily   ezetimibe  10 mg Oral Daily   insulin aspart  0-9 Units Subcutaneous TID WC   insulin glargine-yfgn  24 Units Subcutaneous QHS   metoprolol succinate  12.5 mg Oral Daily   Continuous: [id number] **OR** acetaminophen (TYLENOL) oral liquid 160 mg/5 mL **OR** acetaminophen, senna-docusate   Objective:  Vital Signs  Vitals:   11/16/20 0326 11/16/20 1149 11/16/20 2103 11/17/20 0527  BP: (!) 121/56 104/69 124/70 (!) 122/49  Pulse: 79 96 89 86  Resp: '18 16 18 16  '$ Temp: 98 F (36.7 C) 98.1 F (36.7 C) 99.8 F (37.7 C) 98 F (36.7 C)  TempSrc: Oral Oral Oral Axillary  SpO2: 96% 94% 95% 94%  Weight:      Height:        Intake/Output Summary (Last 24 hours) at 11/17/2020 1050 Last data filed at 11/17/2020 0500 Gross per 24 hour  Intake 240 ml  Output 280 ml  Net -40 ml    Filed Weights   11/13/20 1900 11/13/20 2043  Weight: 82.6 kg 74.8 kg    General appearance: Awake alert.  In no distress Resp: Clear to auscultation bilaterally.  Normal  effort Cardio: S1-S2 is normal regular.  No S3-S4.  No rubs murmurs or bruit GI: Abdomen is soft.  Nontender nondistended.  Bowel sounds are present normal.  No masses organomegaly Extremities: No edema.   Neurologic: Left hemiparesis from previous stroke   Lab Results:  Data Reviewed: I have personally reviewed following labs and imaging studies  CBC: Recent Labs  Lab 11/13/20 1927 11/13/20 1933 11/15/20 0101 11/17/20 0046  WBC 9.4  --  7.7 10.2  NEUTROABS 7.0  --   --   --   HGB 13.7 14.6 13.1 12.7*  HCT 42.1 43.0 38.4* 37.5*  MCV 98.4  --  95.3 94.7  PLT 190  --  167 185     Basic Metabolic Panel: Recent Labs  Lab 11/13/20 1927 11/13/20 1933 11/15/20 0101 11/17/20 0046  NA 133* 136 137 138  K 4.7 4.7 4.0 3.6  CL 101 102 106 106  CO2 24  --  26 24  GLUCOSE 237* 226* 221* 144*  BUN '16 17 15 14  '$ CREATININE 1.30* 1.20 1.20 1.04  CALCIUM 8.9  --  8.7* 8.6*     GFR: Estimated Creatinine Clearance: 48.8 mL/min (by C-G formula based on SCr of 1.04 mg/dL).  Liver Function Tests: Recent Labs  Lab 11/13/20 1927  AST 21  ALT 20  ALKPHOS 59  BILITOT 0.6  PROT 6.5  ALBUMIN 3.2*      Coagulation Profile: Recent Labs  Lab 11/13/20 1927  INR 1.3*       CBG: Recent Labs  Lab 11/16/20 0735 11/16/20 1139 11/16/20 1526 11/16/20 2107 11/17/20 0726  GLUCAP 187* 190* 174* 128* 91     Recent Results (from the past 240 hour(s))  Resp Panel by RT-PCR (Flu A&B, Covid) Nasopharyngeal Swab     Status: None   Collection Time: 11/13/20  7:39 PM   Specimen: Nasopharyngeal Swab; Nasopharyngeal(NP) swabs in vial transport medium  Result Value Ref Range Status   SARS Coronavirus 2 by RT PCR NEGATIVE NEGATIVE Final    Comment: (NOTE) SARS-CoV-2 target nucleic acids are NOT DETECTED.  The SARS-CoV-2 RNA is generally detectable in upper respiratory specimens during the acute phase of infection. The lowest concentration of SARS-CoV-2 viral copies this assay  can detect is 138 copies/mL. A negative result does not preclude SARS-Cov-2 infection and should not be used as the sole basis for treatment or other patient management decisions. A negative result may occur with  improper specimen collection/handling, submission of specimen other than nasopharyngeal swab, presence of viral mutation(s) within the areas targeted by this assay, and inadequate number of viral copies(<138 copies/mL). A negative result must be combined with clinical observations, patient history, and epidemiological information. The expected result is Negative.  Fact Sheet for Patients:  EntrepreneurPulse.com.au  Fact Sheet for Healthcare Providers:  IncredibleEmployment.be  This test is no t yet approved or cleared by the [place] FDA and  has been authorized for detection and/or diagnosis of SARS-CoV-2 by FDA under an Emergency Use Authorization (EUA). This EUA will remain  in effect (meaning this test can be used) for the duration of the COVID-19 declaration under Section 564(b)(1) of the Act, 21 U.S.C.section 360bbb-3(b)(1), unless the authorization is terminated  or revoked sooner.       Influenza A by PCR NEGATIVE NEGATIVE Final   Influenza B by PCR NEGATIVE NEGATIVE Final    Comment: (NOTE) The Xpert Xpress SARS-CoV-2/FLU/RSV plus assay is intended as an aid in the diagnosis of influenza from Nasopharyngeal swab specimens and should not be used as a sole basis for treatment. Nasal washings and aspirates are unacceptable for Xpert Xpress SARS-CoV-2/FLU/RSV testing.  Fact Sheet for Patients: EntrepreneurPulse.com.au  Fact Sheet for Healthcare Providers: IncredibleEmployment.be  This test is not yet approved or cleared by the [place] FDA and has been authorized for detection and/or diagnosis of SARS-CoV-2 by FDA under an Emergency Use Authorization (EUA). This EUA will  remain in effect (meaning this test can be used) for the duration of the COVID-19 declaration under Section 564(b)(1) of the Act, 21 U.S.C. section 360bbb-3(b)(1), unless the authorization is terminated or revoked.  Performed at [facility], [place] [address], [place], [place] [address]        Radiology Studies: No results found.     LOS: 3 days   Tressia Labrum [employer] on [email or web address].[email or web address]  11/17/2020, 10:50 AM

## 2020-11-17 NOTE — Progress Notes (Signed)
Inpatient Rehab Admissions Coordinator:   I continue to await insurance auth and do not have a bed for this Pt. On CIR today.   Clemens Catholic, [place], [place] Admissions Coordinator  [phone] ([place]) [phone] (office)

## 2020-11-17 NOTE — Care Management Important Message (Signed)
Important Message  Patient Details  Name: Fred [name] MRN: [id number] Date of Birth: 06-07-1935   Medicare Important Message Given:  Yes     [name] [name] 11/17/2020, 2:43 PM

## 2020-11-17 NOTE — Progress Notes (Signed)
Physical Therapy Treatment Patient Details Name: Fred [name] MRN: [id number] DOB: 12/25/1935 Today's Date: 11/17/2020    History of Present Illness Pt is an 85 y/o male admitted 9/2 secondary to new onset R weakness. Found to have R frontal lobe infarct. 9/3 rapid response contacted for confusion and disorientation. PMH includes HTN, DM, seizures, a fib, CVA with L sided weakness, CHF, and CAD.    PT Comments    Pt tolerated treatment well with daughter present during session. Daughter mentioned that pt sleeps and gets OOB on R side at home. Pt performed bed mobility with bed flat to R side, requiring increased assist without use of bedrails. Pt had increased weakness during transfers with Stedy compared to previous session, demonstrating significant forward trunk lean and difficulty clearing buttock and pushing through R arm to elevate chest/trunk. Continue to recommend CIR given pt's deficits and performance during today's session.   Follow Up Recommendations  CIR     Equipment Recommendations  None recommended by PT    Recommendations for Other Services       Precautions / Restrictions Precautions Precautions: Fall;Other (comment) Precaution Comments: left hemiplegia Restrictions Weight Bearing Restrictions: No    Mobility  Bed Mobility Overal bed mobility: Needs Assistance Bed Mobility: Supine to Sit     Supine to sit: Mod assist     General bed mobility comments: Mod A to roll to R and evelate trunk supine to sit with bed flat and assist with pads. Daughter mentioned pt usually gets out of bed toward R side. Demonstrated significant forward trunk lean in seated position.    Transfers Overall transfer level: Needs assistance Equipment used: None Transfers: Sit to/from [name] Sit to Stand: Max assist;+2 physical assistance   Squat pivot transfers: Max assist;+2 physical assistance     General transfer comment: Performed sit to stand x7: 4  with stedy, 1 from bed (partial stand), and 1 from recliner (partial stand). Squat pivot transfer from bed to recliner. Max A +2 for all transfers. Pt demonstrated difficulty clearing buttock and standing upright today. With stedy, significant forward trunk lean present. Verbal cues provided to stand upright and push into stedy to lift chest/trunk. Pt mentioned he was trying, but was unsuccessful.  Ambulation/Gait             General Gait Details: Non-ambulatory at baseline   Stairs             Wheelchair Mobility    Modified Rankin (Stroke Patients Only)       Balance Overall balance assessment: Needs assistance Sitting-balance support: Single extremity supported;Feet supported Sitting balance-Leahy Scale: Fair Sitting balance - Comments: Reliant on RUE support to maintain sitting balance. Postural control: Other (comment) (forward trunk lean) Standing balance support: Single extremity supported Standing balance-Leahy Scale: Zero Standing balance comment: Required external assist and RUE. Difficulty with upright posture and clearing buttock in standing                            Cognition Arousal/Alertness: Awake/alert Behavior During Therapy: WFL for tasks assessed/performed Overall Cognitive Status: Within Functional Limits for tasks assessed                                 General Comments: Pt more oriented during session with daughter present.      Exercises      General Comments General  comments (skin integrity, edema, etc.): VSS on RA      Pertinent Vitals/Pain Pain Assessment: No/denies pain    Home Living                      Prior Function            PT Goals (current goals can now be found in the care plan section) Progress towards PT goals: Not progressing toward goals - comment (Pt significantly weaker today with forward trunk lean at EOB and during transfers)    Frequency    Min 4X/week      PT  Plan Current plan remains appropriate    Co-evaluation              AM-PAC PT "6 Clicks" Mobility   Outcome Measure  Help needed turning from your back to your side while in a flat bed without using bedrails?: A Lot Help needed moving from lying on your back to sitting on the side of a flat bed without using bedrails?: A Lot Help needed moving to and from a bed to a chair (including a wheelchair)?: Total Help needed standing up from a chair using your arms (e.g., wheelchair or bedside chair)?: Total Help needed to walk in hospital room?: Total Help needed climbing 3-5 steps with a railing? : Total 6 Click Score: 8    End of Session Equipment Utilized During Treatment: Gait belt Activity Tolerance: Patient tolerated treatment well Patient left: in bed;with bed alarm set;with call bell/phone within reach;with family/visitor present Nurse Communication: Mobility status PT Visit Diagnosis: Unsteadiness on feet (R26.81);Muscle weakness (generalized) (M62.81);Difficulty in walking, not elsewhere classified (R26.2)     Time: [id number] PT Time Calculation (min) (ACUTE ONLY): 35 min  Charges:  $Therapeutic Activity: 23-37 mins                     Louie Casa, SPT Acute Rehab: [phone]    Domingo Dimes 11/17/2020, 12:53 PM

## 2020-11-18 DIAGNOSIS — E1169 Type 2 diabetes mellitus with other specified complication: Secondary | ICD-10-CM | POA: Diagnosis not present

## 2020-11-18 DIAGNOSIS — I2510 Atherosclerotic heart disease of native coronary artery without angina pectoris: Secondary | ICD-10-CM | POA: Diagnosis not present

## 2020-11-18 DIAGNOSIS — G459 Transient cerebral ischemic attack, unspecified: Secondary | ICD-10-CM

## 2020-11-18 DIAGNOSIS — N179 Acute kidney failure, unspecified: Secondary | ICD-10-CM | POA: Diagnosis not present

## 2020-11-18 DIAGNOSIS — I639 Cerebral infarction, unspecified: Secondary | ICD-10-CM | POA: Diagnosis not present

## 2020-11-18 LAB — GLUCOSE, CAPILLARY
Glucose-Capillary: 139 mg/dL — ABNORMAL HIGH (ref 70–99)
Glucose-Capillary: 139 mg/dL — ABNORMAL HIGH (ref 70–99)
Glucose-Capillary: 141 mg/dL — ABNORMAL HIGH (ref 70–99)
Glucose-Capillary: 156 mg/dL — ABNORMAL HIGH (ref 70–99)

## 2020-11-18 NOTE — NC FL2 (Signed)
[place] LEVEL OF CARE SCREENING TOOL     IDENTIFICATION  Patient Name: Fred [name] Birthdate: [birth year] Sex: male Admission Date (Current Location): 11/13/2020  [facility] and [place] Number:  Herbalist and Address:  [facility] [place], [place] [address], [place], [place] [address]      Provider Number: [id number]  Attending Physician Name and Address:  Kerney Elbe, DO  Relative Name and Phone Number:  S[place]on,Debbie Daughter [phone]    Current Level of Care: Hospital Recommended Level of Care: [place] Prior Approval Number:    Date Approved/Denied:   PASRR Number:    Discharge Plan: SNF    Current Diagnoses: Patient Active Problem List   Diagnosis Date Noted   Acute CVA (cerebrovascular accident) ([place]) 11/13/2020   AKI (acute kidney injury) ([place]) 11/13/2020   Hyperlipidemia associated with type 2 diabetes mellitus ([place]) 11/13/2020   Seizure disorder ([place]) 11/13/2020   Depression with anxiety 11/13/2020   Urinary incontinence with continuous leakage 10/11/2019   Oropharyngeal dysphagia 08/02/2019   Fecal urgency 08/02/2019   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[place]) 09/25/2017   Chronic diastolic (congestive) heart failure (HCC)    Hemiparesis affecting left side as late effect of cerebrovascular accident ([place])    Small vessel disease ([place]) 08/11/2017   Left hemiparesis ([place])    History of CVA (cerebrovascular accident) 08/10/2017   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 of localized onset, not intractable, without status epilepticus ([place]) 06/20/2017   Primary osteoarthritis of both knees 05/11/2017   Stroke (cerebrum) ([place]) 03/21/2017   Type 2 diabetes mellitus with complication, with long-term current use of insulin ([place]) 03/27/2015   Atherosclerosis of native coronary artery of native heart without angina pectoris 03/27/2015    Palpitations 11/04/2014   S/P CABG (coronary artery bypass graft) 11/04/2014   Atrial fibrillation, unspecified    History of stroke 09/04/2014   Obesity (BMI 30-39.9) 11/29/2013   History of gout 05/16/2013   CHF, chronic ([place]) 04/20/2009   CARDIOVASCULAR FUNCTION STUDY, ABNORMAL 01/29/2009   ANEMIA / OTHER 05/16/2007   Coronary atherosclerosis 05/16/2007   PAROXYSMAL ATRIAL FIBRILLATION 05/16/2007   Subcortical infarction ([place]) 05/16/2007   Hyperlipidemia 03/30/2007   DEGENERATIVE JOINT DISEASE 03/30/2007   PAROTID LESION, UNSPECIFIED 09/29/2006   Partial epilepsy with impairment of consciousness ([place]) 09/29/2006   Diabetic polyneuropathy ([place]) 09/29/2006   Hypertension associated with diabetes ([place]) 09/29/2006   PERIPHERAL VASCULAR DISEASE 09/29/2006   DIVERTICULOSIS 09/29/2006    Orientation RESPIRATION BLADDER Height & Weight     Self, Time, Situation, Place  Normal External catheter Weight: 165 lb (74.8 kg) Height:  '5\' 3"'$  (160 cm)  BEHAVIORAL SYMPTOMS/MOOD NEUROLOGICAL BOWEL NUTRITION STATUS    Convulsions/Seizures Continent Diet (see discharge summary)  AMBULATORY STATUS COMMUNICATION OF NEEDS Skin   Total Care Verbally Normal                       Personal Care Assistance Level of Assistance  Bathing, Feeding, Dressing Bathing Assistance: Limited assistance Feeding assistance: Independent Dressing Assistance: Limited assistance     Functional Limitations Info  Sight, Hearing, Speech Sight Info: Adequate Hearing Info: Adequate Speech Info: Adequate    SPECIAL CARE FACTORS FREQUENCY  PT (By licensed PT), OT (By licensed OT)     PT Frequency: 5x week OT Frequency: 5x week            Contractures Contractures Info: Not present  Additional Factors Info  Code Status, Allergies, Insulin Sliding Scale Code Status Info: full Allergies Info: Penicillins, Lisinopril   Insulin Sliding Scale Info: Novolog, 0-9 units 3x day with meals.  See discharge  summary       Current Medications (11/18/2020):  This is the current hospital active medication list Current Facility-Administered Medications  Medication Dose Route Frequency Provider Last Rate Last Admin    stroke: mapping our early stages of recovery book   Does not apply Once Lenore Cordia, MD       acetaminophen (TYLENOL) tablet 650 mg  650 mg Oral Q4H PRN Lenore Cordia, MD       Or   acetaminophen (TYLENOL) 160 MG/5ML solution 650 mg  650 mg Per Tube Q4H PRN Lenore Cordia, MD       Or   acetaminophen (TYLENOL) suppository 650 mg  650 mg Rectal Q4H PRN Lenore Cordia, MD       allopurinol (ZYLOPRIM) tablet 300 mg  300 mg Oral Daily Zada Finders R, MD   300 mg at 11/18/20 1010   aspirin EC tablet 81 mg  81 mg Oral Daily Zada Finders R, MD   81 mg at 11/18/20 1009   atorvastatin (LIPITOR) tablet 80 mg  80 mg Oral Daily Zada Finders R, MD   80 mg at 11/18/20 1010   baclofen (LIORESAL) tablet 10 mg  10 mg Oral BID Zada Finders R, MD   10 mg at 11/18/20 1010   clopidogrel (PLAVIX) tablet 75 mg  75 mg Oral Daily Bailey-Modzik, Delila A, NP   75 mg at 11/18/20 1010   divalproex (DEPAKOTE ER) 24 hr tablet 500 mg  500 mg Oral QHS Zada Finders R, MD   500 mg at 11/17/20 2059   doxepin (SINEQUAN) capsule 25 mg  25 mg Oral QHS Zada Finders R, MD   25 mg at 11/17/20 2059   enoxaparin (LOVENOX) injection 40 mg  40 mg Subcutaneous Q24H Bonnielee Haff, MD   40 mg at 11/18/20 1214   escitalopram (LEXAPRO) tablet 10 mg  10 mg Oral Daily Zada Finders R, MD   10 mg at 11/18/20 1010   ezetimibe (ZETIA) tablet 10 mg  10 mg Oral Daily Zada Finders R, MD   10 mg at 11/18/20 1010   insulin aspart (novoLOG) injection 0-9 Units  0-9 Units Subcutaneous TID WC Lenore Cordia, MD   1 Units at 11/18/20 1214   insulin glargine-yfgn (SEMGLEE) injection 24 Units  24 Units Subcutaneous QHS Bonnielee Haff, MD   24 Units at 11/17/20 2059   metoprolol succinate (TOPROL-XL) 24 hr tablet 12.5 mg  12.5 mg Oral  Daily Bonnielee Haff, MD   12.5 mg at 11/18/20 1009   senna-docusate (Senokot-S) tablet 1 tablet  1 tablet Oral QHS PRN Lenore Cordia, MD         Discharge Medications: Please see discharge summary for a list of discharge medications.  Relevant Imaging Results:  Relevant Lab Results:   Additional Information SSN: [id number].  Pt is vaccinated for covid with one booster.  Joanne Chars, LCSW

## 2020-11-18 NOTE — Progress Notes (Addendum)
Physical Therapy Treatment Patient Details Name: Fred [name] MRN: [id number] DOB: 12/09/1935 Today's Date: 11/18/2020    History of Present Illness Pt is an 85 y/o male admitted 9/2 secondary to new onset R weakness. Found to have R frontal lobe infarct. 9/3 rapid response contacted for confusion and disorientation. PMH includes HTN, DM, seizures, a fib, CVA with L sided weakness, CHF, and CAD.    PT Comments    Pt drowsy on arrival.  Became more alert and a bit more participative after exercise to warm up.  Emphasis on transition with scooting assist, sitting balance/w/shifting, sit to stands with stress on upright posture and transfer to the chair both with face to face assist.    Follow Up Recommendations  SNF     Equipment Recommendations  None recommended by PT    Recommendations for Other Services       Precautions / Restrictions Precautions Precautions: Fall;Other (comment) (L UE positioning) Precaution Comments: left hemiplegia    Mobility  Bed Mobility Overal bed mobility: Needs Assistance Bed Mobility: Supine to Sit     Supine to sit: Mod assist     General bed mobility comments: pt asked to get up on the L side (not his usual), cued for sequencing, assisted trunk up via R UE and up to sitting.  minimal assist to help scoot with pt initiating weak forward momentum and light R UE assist    Transfers Overall transfer level: Needs assistance Equipment used: None Transfers: Squat Pivot Transfers Sit to Stand: Max assist;+2 safety/equipment   Squat pivot transfers: Max assist;+2 safety/equipment     General transfer comment: sit to stand x2 with face to face assist and pad to facilitate pelvic extention.  Also face to face assist for the squat pivot transfer  Ambulation/Gait             General Gait Details: Non-ambulatory at baseline   Stairs             Wheelchair Mobility    Modified Rankin (Stroke Patients Only) Modified Rankin  (Stroke Patients Only) Pre-Morbid Rankin Score: Moderate disability Modified Rankin: Severe disability     Balance   Sitting-balance support: Single extremity supported;Feet supported   Sitting balance - Comments: prefers use of R UE, but fairly steady EOB   Standing balance support: Single extremity supported;During functional activity Standing balance-Leahy Scale: Poor Standing balance comment: face to face sit to stand with little use of R LE initially, then some assist 2nd trial blocking only L LE.                            Cognition Arousal/Alertness: Awake/alert Behavior During Therapy: WFL for tasks assessed/performed Overall Cognitive Status:  (NT formally)                                        Exercises Other Exercises Other Exercises: warm up ROM exercise hip knee flexion and bicep/tricep presses with graded assist/resistance when appropriate, bil    General Comments General comments (skin integrity, edema, etc.): Pt's family stated pt uses lift chair, a stationary "stand" for the R UE and assisted L side to pivot with little use of L LE into chair/scooter.  Easier to return to the lift chair.      Pertinent Vitals/Pain Pain Assessment: Faces Faces Pain Scale: No hurt Pain Intervention(s):  Monitored during session    Home Living                      Prior Function            PT Goals (current goals can now be found in the care plan section) Acute Rehab PT Goals PT Goal Formulation: With patient Time For Goal Achievement: 11/28/20 Potential to Achieve Goals: Good Progress towards PT goals: Progressing toward goals    Frequency    Min 3X/week      PT Plan Discharge plan needs to be updated;Frequency needs to be updated    Co-evaluation              AM-PAC PT "6 Clicks" Mobility   Outcome Measure  Help needed turning from your back to your side while in a flat bed without using bedrails?: A Lot Help  needed moving from lying on your back to sitting on the side of a flat bed without using bedrails?: A Lot Help needed moving to and from a bed to a chair (including a wheelchair)?: Total Help needed standing up from a chair using your arms (e.g., wheelchair or bedside chair)?: Total Help needed to walk in hospital room?: Total Help needed climbing 3-5 steps with a railing? : Total 6 Click Score: 8    End of Session   Activity Tolerance: Patient tolerated treatment well Patient left: in chair;with call bell/phone within reach;with family/visitor present;with chair alarm set;Other (comment) (lift pad) Nurse Communication: Mobility status PT Visit Diagnosis: Unsteadiness on feet (R26.81);Muscle weakness (generalized) (M62.81);Difficulty in walking, not elsewhere classified (R26.2)     Time: 1335-1400 PT Time Calculation (min) (ACUTE ONLY): 25 min  Charges:  $Therapeutic Activity: 8-22 mins $Neuromuscular Re-education: 8-22 mins                     11/18/2020  Ginger Carne., PT Acute Rehabilitation Services [phone]  (pager) [phone]  (office)   Tessie Fass Fong Mccarry 11/18/2020, 2:41 PM

## 2020-11-18 NOTE — TOC Initial Note (Addendum)
Transition of Care [facility]) - Initial/Assessment Note    Patient Details  Name: Fred [name] MRN: [id number] Date of Birth: December 11, 1935  Transition of Care [facility]) CM/SW Contact:    Joanne Chars, [place] Phone Number: 11/18/2020, 2:53 PM  Clinical Narrative:   CSW attempted to speak with pt in room, pt sleeping soundly did not wake, 2 granddaughers present, pt daughter Butch Penny called in on cell phone to provide information.    Per daughter, pt lives alone with [facility] aide assistance during the day, nephew spends the night in the home.  Pt is followed at [facility] as well by non [facility] MD [place].  Pt is vaccinated for covid with one booster.    Per Butch Penny, after CIR was denied by insurance, they are in agreement with plan for SNF.  They were looking at new [place] facility nearby but just found out it is not open yet.  [facility] choice for SNF is [facility], which is near their home.  Permission given to send out referral in hub.  Choice document left with granddaughter who will share with Butch Penny.   1600: CSW received call from daughter Butch Penny that they would like to pursue VA placement at [facility].  Butch Penny reports she spoke to [employer] at [facility] [phone] who told her there were open beds for rehab currently available.  CSW spoke with Whitney at [facility] who reports she does not have any bed availability at this time. Cobi is admissions person for long term care at [facility] and may have openings there.  CSW called Butch Penny back to let her know this and shared that this is typical for [place].  Butch Penny hung up and called back a few minutes later and said she had just spoken to Cobi again who said Loree Fee is out of the office on vacation.  (CSW did not ask Whitney her location when I spoke with her on the phone.) CSW told daughter will initiate the [place] SNF approval process tomorrow and will confirm if there are any beds at [facility].                  Expected Discharge Plan: Skilled Nursing  Facility Barriers to Discharge: Continued Medical Work up, SNF Pending bed offer   Patient Goals and CMS Choice   CMS Medicare.gov Compare Post Acute Care list provided to:: Patient Represenative (must comment) Choice offered to / list presented to : Adult Children  Expected Discharge Plan and Services Expected Discharge Plan: [place] Choice: [place] arrangements for the past 2 months: Single Family Home                                      Prior Living Arrangements/Services Living arrangements for the past 2 months: Single Family Home Lives with:: Self Patient language and need for interpreter reviewed:: No        Need for Family Participation in Patient Care: Yes (Comment) Care giver support system in place?: Yes (comment) Current home services: Homehealth aide Criminal Activity/Legal Involvement Pertinent to Current Situation/Hospitalization: No - Comment as needed  Activities of Daily Living      Permission Sought/Granted                  Emotional Assessment Appearance:: Appears stated age Attitude/Demeanor/Rapport: Unable to Assess Affect (typically observed): Unable to Assess Orientation: : Oriented  to Self, Oriented to Place, Oriented to  Time, Oriented to Situation Alcohol / Substance Use: Not Applicable Psych Involvement: Outpatient Provider  Admission diagnosis:  Acute CVA (cerebrovascular accident) [facility]) [I63.9] Cerebrovascular accident (CVA), unspecified mechanism ([place]) [I63.9] Patient Active Problem List   Diagnosis Date Noted   Acute CVA (cerebrovascular accident) ([place]) 11/13/2020   AKI (acute kidney injury) ([place]) 11/13/2020   Hyperlipidemia associated with type 2 diabetes mellitus ([place]) 11/13/2020   Seizure disorder ([place]) 11/13/2020   Depression with anxiety 11/13/2020   Urinary incontinence with continuous leakage 10/11/2019   Oropharyngeal dysphagia 08/02/2019   Fecal  urgency 08/02/2019   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[place]) 09/25/2017   Chronic diastolic (congestive) heart failure (HCC)    Hemiparesis affecting left side as late effect of cerebrovascular accident ([place])    Small vessel disease ([place]) 08/11/2017   Left hemiparesis ([place])    History of CVA (cerebrovascular accident) 08/10/2017   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 of localized onset, not intractable, without status epilepticus ([place]) 06/20/2017   Primary osteoarthritis of both knees 05/11/2017   Stroke (cerebrum) ([place]) 03/21/2017   Type 2 diabetes mellitus with complication, with long-term current use of insulin ([place]) 03/27/2015   Atherosclerosis of native coronary artery of native heart without angina pectoris 03/27/2015   Palpitations 11/04/2014   S/P CABG (coronary artery bypass graft) 11/04/2014   Atrial fibrillation, unspecified    History of stroke 09/04/2014   Obesity (BMI 30-39.9) 11/29/2013   History of gout 05/16/2013   CHF, chronic ([place]) 04/20/2009   CARDIOVASCULAR FUNCTION STUDY, ABNORMAL 01/29/2009   ANEMIA / OTHER 05/16/2007   Coronary atherosclerosis 05/16/2007   PAROXYSMAL ATRIAL FIBRILLATION 05/16/2007   Subcortical infarction ([place]) 05/16/2007   Hyperlipidemia 03/30/2007   DEGENERATIVE JOINT DISEASE 03/30/2007   PAROTID LESION, UNSPECIFIED 09/29/2006   Partial epilepsy with impairment of consciousness ([place]) 09/29/2006   Diabetic polyneuropathy ([place]) 09/29/2006   Hypertension associated with diabetes ([place]) 09/29/2006   PERIPHERAL VASCULAR DISEASE 09/29/2006   DIVERTICULOSIS 09/29/2006   PCP:  [name], MD Pharmacy:   [facility] [address] [place] [address] Phone: [phone] Fax: [place], [place] - [place] [facility] [place]  [address] Phone: [phone] Fax: [phone]  [facility] [id number] - [place], [place] - [address] [place] [address] [place] [address] [place] [address] Phone: [phone] Fax: [phone]     Social Determinants of Health (SDOH) Interventions    Readmission Risk Interventions No flowsheet data found.

## 2020-11-18 NOTE — Progress Notes (Signed)
Inpatient Rehab Admissions Coordinator:   Pt.'s insurance denied CIR. I will not pursue an appeal. Family notified and states they want [facility] in [place].  Clemens Catholic, [place], [place] Admissions Coordinator  [phone] ([place]) [phone] (office)

## 2020-11-18 NOTE — Progress Notes (Signed)
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  Symptoms apparently began 2 days prior to admission and patient was evaluated in the ED.  He underwent MRI which actually showed a right-sided small stroke.  He was hospitalized for further management and he was previously on aspirin Plavix and recently taken off Plavix and started on Eliquis by his cardiologist at the [facility] for diagnosis of atrial fibrillation.  Neurology evaluated for his acute CVA and MRI showed an acute infarct in the right cerebral hemisphere and the patient came in with right-sided weakness and was noted to have slurred speech.  Neurology did not feel that the CVA is related to his symptoms and his right-sided weakness improved.  Patient continues to have persistent left-sided weakness and hemiplegia.  He was evaluated by CIR for candidacy however is not found to be a candidate for now PT OT is recommending SNF.  Currently is being treated for the following but not limited to   Assessment & Plan:   Principal Problem:   Acute CVA (cerebrovascular accident) [facility]) Active Problems:   Hypertension associated with diabetes ([place])   Coronary atherosclerosis   Type 2 diabetes mellitus with complication, with long-term current use of insulin ([place])   AKI (acute kidney injury) ([place])   Hyperlipidemia associated with type 2 diabetes mellitus ([place])   Seizure disorder  ([place])   Depression with anxiety  Right Leg Weakness; ? TIA -Worked up for CVA and had incident findings as below -VTE [place] done and showed no DVT -In the setting of left-sided ICA stenosis possible syncope -CTA of the head and neck showed left ICA 60 to 70% stenosis -Neurology recommends avoiding dehydration and recommending blood pressure monitoring at home -Neurology also recommends outpatient follow-up with vascular surgery given his evidence of finding a CTA neck -Continue with aspirin and Plavix as he remains off of Eliquis  Acute CVA likely incidental -Heard CT done and showed No acute intracranial abnormality -EEG showed  "This EEG was recorded in the drowsy and sleeping state, the lack of waking state limits the ability to assess for encephalopathy. There was no seizure or seizure predisposition recorded on this study. Please note that lack of epileptiform activity on EEG does not preclude the possibility of epilepsy." -MRI showed acute infarct and read it as "Two punctate foci of acute ischemia within the subcortical right frontal lobe, 1 of which is along the right precentral gyrus. No hemorrhage or mass effect. Advanced white matter disease most consistent with chronic small vessel ischemia." -MRA showed "Normal intracranial MRA." -Patient came in with right-sided weakness which resolved  -Patient has prior history of CVA with residual left-sided hemiplegia.  -Also was noted to have slurred speech.   -Echocardiogram as below.  It showed normal systolic function.  Grade 1 diastolic dysfunction.  Mild LVH was noted.  -Patient was initially continued on Eliquis and aspirin.  After discussions between patient and family and neurology and review of his previous studies patient has been transitioned to aspirin  and Plavix. -Noted to be on atorvastatin as well.  LDL noted to be 52.  HbA1c 7.8.   -CT angiogram head and neck does 60 presents to notices left ICA and 60% vertebral artery  stenosis.  Neurology recommends follow-up with vascular surgery in the outpatient setting.   -Patient also underwent EEG which did not show any epileptiform activity -Seen by PT and OT and SLP.  Inpatient rehabilitation was recommended but his insurance authorization has been denied technology we will pursue SNF  History of paroxysmal atrial fibrillation -Recently started on Eliquis by his cardiologist at the [facility]. however apparently patient's 30-day monitor did not show any evidence for atrial fibrillation.  -Initial episode of atrial fibrillation was in the setting of CABG in the postoperative period.  -Neurology feels patient would be better served by being on dual antiplatelet treatment.   -So he has been changed over to aspirin and Plavix after discussions between neurology and the patient and patient's daughter.   -Metoprolol was currently on hold for permissive hypertension but resumed at a lower dose -Blood pressure noted to be low normal.     Left lower extremity edema -Dopplers negative for DVT.  Most likely dependent edema.   Elevated creatinine -Creatinine was noted to be 1.3 on admission.   -Subsequently noted to be normal.  Monitor urine output.  Losartan on hold.    Essential hypertension -Permissive hypertension was allowed.  Losartan and metoprolol were placed on hold.  Blood pressure remains low normal.  -Metoprolol resumed primarily for A. fib at a lower dose.  Continue to hold losartan.   -Continue to monitor blood pressures per protocol last blood pressure reading was 103/58  Diabetes mellitus type 2, insulin-dependent -Noted to be on Toujeo at home.  Currently on glargine insulin.  -HbA1c is 7.8.   -Dose of glargine was adjusted slightly the day beforeyesterday.  -CBG noted to be 91 yesterday morning.   -We will see how the trends are during the rest of the day and may need to cut back if it remains on the lower side.   -Home medication list shows that he is also  supposed to be on -Jardiance and Tradjenta prior to admission.  These are currently on hold.   -CBG's ranging from 91-219  History of coronary artery disease status post CABG -Stable.  Continue aspirin, statin, Zetia and low dose Metoprolol   History of Seizure Disorder -Continue Depakote.  Hyperlipidemia -Continue Atorvastatin 80 mg po Daily and Zetia.  History of depression and anxiety -Continue Lexapro doxepin.    DVT prophylaxis: Enoxparin 40 mg sq q24h Code Status: FULL CODE  Family Communication: Discussed with Granddaughter at bedside Disposition Plan: SNF since Insurance Authorization was denied by Dr. Prudy Feeler   Status is: Inpatient  Remains inpatient appropriate because:Unsafe d/c plan, IV treatments appropriate due to intensity of illness or inability to take PO, and Inpatient level of care appropriate due to severity of illness  Dispo:  Patient From: Home  Planned Disposition: [place]  Medically stable for discharge: Yes  Consultants:  Neurology  Procedures:  ECHOCARDIOGRAM IMPRESSIONS     1. Left ventricular ejection fraction, by estimation, is 60 to 65%. The  left ventricle has normal function. The left ventricle has no regional  wall motion abnormalities. There is mild left ventricular hypertrophy.  Left ventricular diastolic parameters  are consistent with Grade I diastolic dysfunction (impaired relaxation).   2. Right ventricular systolic function is normal. The right ventricular  size is  normal. There is normal pulmonary artery systolic pressure.   3. Left atrial size was moderately dilated.   4. The mitral valve is normal in structure. No evidence of mitral valve  regurgitation. No evidence of mitral stenosis.   5. The aortic valve is normal in structure. Aortic valve regurgitation is  trivial. Mild to moderate aortic valve sclerosis/calcification is present,  without any evidence of aortic stenosis.   6. The inferior vena cava is  normal in size with greater than 50%  respiratory variability, suggesting right atrial pressure of 3 mmHg.   Conclusion(s)/Recommendation(s): No intracardiac source of embolism  detected on this transthoracic study. A transesophageal echocardiogram is  recommended to exclude cardiac source of embolism if clinically indicated.   FINDINGS   Left Ventricle: Left ventricular ejection fraction, by estimation, is 60  to 65%. The left ventricle has normal function. The left ventricle has no  regional wall motion abnormalities. The left ventricular internal cavity  size was normal in size. There is   mild left ventricular hypertrophy. Left ventricular diastolic parameters  are consistent with Grade I diastolic dysfunction (impaired relaxation).   Right Ventricle: The right ventricular size is normal. No increase in  right ventricular wall thickness. Right ventricular systolic function is  normal. There is normal pulmonary artery systolic pressure. The tricuspid  regurgitant velocity is 2.55 m/s, and   with an assumed right atrial pressure of 3 mmHg, the estimated right  ventricular systolic pressure is 0000000 mmHg.   Left Atrium: Left atrial size was moderately dilated.   Right Atrium: Right atrial size was normal in size.   Pericardium: There is no evidence of pericardial effusion.   Mitral Valve: The mitral valve is normal in structure. No evidence of  mitral valve regurgitation. No evidence of mitral valve stenosis.   Tricuspid Valve: The tricuspid valve is normal in structure. Tricuspid  valve regurgitation is not demonstrated. No evidence of tricuspid  stenosis.   Aortic Valve: The aortic valve is normal in structure. Aortic valve  regurgitation is trivial. Mild to moderate aortic valve  sclerosis/calcification is present, without any evidence of aortic  stenosis. Aortic valve mean gradient measures 6.0 mmHg. Aortic  valve peak gradient measures 11.4 mmHg. Aortic valve area, by VTI  measures  1.58 cm.   Pulmonic Valve: The pulmonic valve was normal in structure. Pulmonic valve  regurgitation is not visualized. No evidence of pulmonic stenosis.   Aorta: The aortic root is normal in size and structure.   Venous: The inferior vena cava is normal in size with greater than 50%  respiratory variability, suggesting right atrial pressure of 3 mmHg.   IAS/Shunts: No atrial level shunt detected by color flow Doppler.      LEFT VENTRICLE  PLAX 2D  LVIDd:         3.80 cm     Diastology  LVIDs:         2.60 cm     LV e' medial:    6.20 cm/s  LV PW:         1.20 cm     LV E/e' medial:  15.8  LV IVS:        1.10 cm     LV e' lateral:   8.05 cm/s  LVOT diam:     2.00 cm     LV E/e' lateral: 12.2  LV SV:         62  LV SV Index:   35  LVOT Area:  3.14 cm     LV Volumes (MOD)  LV vol d, MOD A2C: 64.0 ml  LV vol d, MOD A4C: 77.4 ml  LV vol s, MOD A2C: 25.8 ml  LV vol s, MOD A4C: 30.0 ml  LV SV MOD A2C:     38.2 ml  LV SV MOD A4C:     77.4 ml  LV SV MOD BP:      44.4 ml   RIGHT VENTRICLE  RV Basal diam:  3.15 cm  RV Mid diam:    3.80 cm  RV S prime:     8.39 cm/s  TAPSE (M-mode): 1.7 cm   LEFT ATRIUM             Index       RIGHT ATRIUM           Index  LA diam:        4.90 cm 2.75 cm/m  RA Area:     11.90 cm  LA Vol (A2C):   56.0 ml 31.43 ml/m RA Volume:   21.40 ml  12.01 ml/m  LA Vol (A4C):   57.5 ml 32.27 ml/m  LA Biplane Vol: 58.6 ml 32.89 ml/m   AORTIC VALVE  AV Area (Vmax):    1.59 cm  AV Area (Vmean):   1.56 cm  AV Area (VTI):     1.58 cm  AV Vmax:           169.00 cm/s  AV Vmean:          115.000 cm/s  AV VTI:            0.392 m  AV Peak Grad:      11.4 mmHg  AV Mean Grad:      6.0 mmHg  LVOT Vmax:         85.50 cm/s  LVOT Vmean:        57.100 cm/s  LVOT VTI:          0.197 m  LVOT/AV VTI ratio: 0.50   MITRAL VALVE                TRICUSPID VALVE  MV Area (PHT): 3.37 cm     TR Peak grad:   26.0 mmHg  MV Decel Time: 225 msec     TR  Vmax:        255.00 cm/s  MV E velocity: 98.10 cm/s  MV A velocity: 118.00 cm/s  SHUNTS  MV E/A ratio:  0.83         Systemic VTI:  0.20 m                              Systemic Diam: 2.00 cm   Antimicrobials:  Anti-infectives (From admission, onward)    None        Subjective: Seen and examined at bedside he still weak on his left side.  No chest pain or shortness breath.  Felt okay.  Did relatively well overnight.  No other concerns or complaints at this time and unfortunately he is denied insurance authorization for CIR so we will need to go to SNF.  Objective: Vitals:   11/17/20 1419 11/17/20 2058 11/18/20 0612 11/18/20 1149  BP: 123/65 132/69 (!) 158/80 (!) 103/58  Pulse: 96 99 99 86  Resp:  '20 14 18  '$ Temp: 99 F (37.2 C) 99.9 F (37.7 C) 98.1 F (36.7 C) 99.1 F (37.3 C)  TempSrc:  Oral Oral Oral Oral  SpO2: 97% 100% 95% 94%  Weight:      Height:        Intake/Output Summary (Last 24 hours) at 11/18/2020 1744 Last data filed at 11/18/2020 0215 Gross per 24 hour  Intake --  Output 200 ml  Net -200 ml   Filed Weights   11/13/20 1900 11/13/20 2043  Weight: 82.6 kg 74.8 kg   Examination: Physical Exam:  Constitutional: WN/WD overweight Caucasian male in NAD and appears calm and comfortable Eyes: Lids and conjunctivae normal, sclerae anicteric  ENMT: External Ears, Nose appear normal. Grossly normal hearing.  Neck: Appears normal, supple, no cervical masses, normal ROM, no appreciable thyromegaly; no JVD Respiratory: Diminished to auscultation bilaterally, no wheezing, rales, rhonchi or crackles. Normal respiratory effort and patient is not tachypenic. No accessory muscle use.  Unlabored breathing Cardiovascular: RRR, no murmurs / rubs / gallops. S1 and S2 auscultated.  Has some mild lower extremity edema worse on the right compared to left Abdomen: Soft, non-tender, distended secondary body habitus.  Bowel sounds positive.  GU: Deferred. Musculoskeletal: No  clubbing / cyanosis of digits/nails. No joint deformity upper and lower extremities. Skin: No rashes, lesions, ulcers on limited skin evaluation. No induration; Warm and dry.  Neurologic: CN 2-12 grossly intact with no focal deficits.  Diminished strength on the left compared to right Psychiatric: Normal judgment and insight. Alert and oriented x 3. Normal mood and appropriate affect.   Data Reviewed: I have personally reviewed following labs and imaging studies  CBC: Recent Labs  Lab 11/13/20 1927 11/13/20 1933 11/15/20 0101 11/17/20 0046  WBC 9.4  --  7.7 10.2  NEUTROABS 7.0  --   --   --   HGB 13.7 14.6 13.1 12.7*  HCT 42.1 43.0 38.4* 37.5*  MCV 98.4  --  95.3 94.7  PLT 190  --  167 [id number]   Basic Metabolic Panel: Recent Labs  Lab 11/13/20 1927 11/13/20 1933 11/15/20 0101 11/17/20 0046  NA 133* 136 137 138  K 4.7 4.7 4.0 3.6  CL 101 102 106 106  CO2 24  --  26 24  GLUCOSE 237* 226* 221* 144*  BUN '16 17 15 14  '$ CREATININE 1.30* 1.20 1.20 1.04  CALCIUM 8.9  --  8.7* 8.6*   GFR: Estimated Creatinine Clearance: 48.8 mL/min (by C-G formula based on SCr of 1.04 mg/dL). Liver Function Tests: Recent Labs  Lab 11/13/20 1927  AST 21  ALT 20  ALKPHOS 59  BILITOT 0.6  PROT 6.5  ALBUMIN 3.2*   No results for input(s): LIPASE, AMYLASE in the last 168 hours. No results for input(s): AMMONIA in the last 168 hours. Coagulation Profile: Recent Labs  Lab 11/13/20 1927  INR 1.3*   Cardiac Enzymes: No results for input(s): CKTOTAL, CKMB, CKMBINDEX, TROPONINI in the last 168 hours. BNP (last 3 results) No results for input(s): PROBNP in the last 8760 hours. HbA1C: No results for input(s): HGBA1C in the last 72 hours. CBG: Recent Labs  Lab 11/17/20 1657 11/17/20 2006 11/18/20 0945 11/18/20 1127 11/18/20 1629  GLUCAP 178* 219* 139* 139* 141*   Lipid Profile: No results for input(s): CHOL, HDL, LDLCALC, TRIG, CHOLHDL, LDLDIRECT in the last 72 hours. Thyroid Function  Tests: No results for input(s): TSH, T4TOTAL, FREET4, T3FREE, THYROIDAB in the last 72 hours. Anemia Panel: No results for input(s): VITAMINB12, FOLATE, FERRITIN, TIBC, IRON, RETICCTPCT in the last 72 hours. Sepsis Labs: No results for input(s): PROCALCITON, LATICACIDVEN in the last  168 hours.  Recent Results (from the past 240 hour(s))  Resp Panel by RT-PCR (Flu A&B, Covid) Nasopharyngeal Swab     Status: None   Collection Time: 11/13/20  7:39 PM   Specimen: Nasopharyngeal Swab; Nasopharyngeal(NP) swabs in vial transport medium  Result Value Ref Range Status   SARS Coronavirus 2 by RT PCR NEGATIVE NEGATIVE Final    Comment: (NOTE) SARS-CoV-2 target nucleic acids are NOT DETECTED.  The SARS-CoV-2 RNA is generally detectable in upper respiratory specimens during the acute phase of infection. The lowest concentration of SARS-CoV-2 viral copies this assay can detect is 138 copies/mL. A negative result does not preclude SARS-Cov-2 infection and should not be used as the sole basis for treatment or other patient management decisions. A negative result may occur with  improper specimen collection/handling, submission of specimen other than nasopharyngeal swab, presence of viral mutation(s) within the areas targeted by this assay, and inadequate number of viral copies(<138 copies/mL). A negative result must be combined with clinical observations, patient history, and epidemiological information. The expected result is Negative.  Fact Sheet for Patients:  EntrepreneurPulse.com.au  Fact Sheet for Healthcare Providers:  IncredibleEmployment.be  This test is no t yet approved or cleared by the [place] FDA and  has been authorized for detection and/or diagnosis of SARS-CoV-2 by FDA under an Emergency Use Authorization (EUA). This EUA will remain  in effect (meaning this test can be used) for the duration of the COVID-19 declaration under Section  564(b)(1) of the Act, 21 U.S.C.section 360bbb-3(b)(1), unless the authorization is terminated  or revoked sooner.       Influenza A by PCR NEGATIVE NEGATIVE Final   Influenza B by PCR NEGATIVE NEGATIVE Final    Comment: (NOTE) The Xpert Xpress SARS-CoV-2/FLU/RSV plus assay is intended as an aid in the diagnosis of influenza from Nasopharyngeal swab specimens and should not be used as a sole basis for treatment. Nasal washings and aspirates are unacceptable for Xpert Xpress SARS-CoV-2/FLU/RSV testing.  Fact Sheet for Patients: EntrepreneurPulse.com.au  Fact Sheet for Healthcare Providers: IncredibleEmployment.be  This test is not yet approved or cleared by the [place] FDA and has been authorized for detection and/or diagnosis of SARS-CoV-2 by FDA under an Emergency Use Authorization (EUA). This EUA will remain in effect (meaning this test can be used) for the duration of the COVID-19 declaration under Section 564(b)(1) of the Act, 21 U.S.C. section 360bbb-3(b)(1), unless the authorization is terminated or revoked.  Performed at [facility], [place] [address], [place], [place] [address]     RN Pressure Injury Documentation:     Estimated body mass index is 29.23 kg/m as calculated from the following:   Height as of this encounter: '5\' 3"'$  (1.6 m).   Weight as of this encounter: 74.8 kg.  Malnutrition Type:   Malnutrition Characteristics:  Nutrition Interventions:     Radiology Studies: No results found.  Scheduled Meds:   stroke: mapping our early stages of recovery book   Does not apply Once   allopurinol  300 mg Oral Daily   aspirin EC  81 mg Oral Daily   atorvastatin  80 mg Oral Daily   baclofen  10 mg Oral BID   clopidogrel  75 mg Oral Daily   divalproex  500 mg Oral QHS   doxepin  25 mg Oral QHS   enoxaparin (LOVENOX) injection  40 mg Subcutaneous Q24H   escitalopram  10 mg Oral Daily   ezetimibe  10 mg Oral  Daily   insulin aspart  0-9 Units Subcutaneous TID WC   insulin glargine-yfgn  24 Units Subcutaneous QHS   metoprolol succinate  12.5 mg Oral Daily   Continuous Infusions:   LOS: 4 days   Kerney Elbe, DO [facility] PAGER is on AMION  If 7PM-7AM, please contact night-coverage [email or web address]

## 2020-11-18 NOTE — Progress Notes (Signed)
    RE:  Fred [name]       Date of Birth: June 07, 2035      Date:  11/18/20        To Whom It May Concern:  Please be advised that the above-named patient will require a short-term nursing home stay - anticipated 30 days or less for rehabilitation and strengthening.  The plan is for return home.                 MD signature                Date

## 2020-11-18 NOTE — Progress Notes (Signed)
Inpatient Rehab Admissions Coordinator:   Pt.'s insurance has requested a peer to peer. MD to complete by 12pm today. If denied, family states that they wish to appeal, but are also interested in SNF at [facility] in [place] if appeal is not successful. I will let the case manager know.  Clemens Catholic, [place], [place] Admissions Coordinator  [phone] ([place]) [phone] (office)

## 2020-11-19 DIAGNOSIS — E1169 Type 2 diabetes mellitus with other specified complication: Secondary | ICD-10-CM | POA: Diagnosis not present

## 2020-11-19 DIAGNOSIS — N179 Acute kidney failure, unspecified: Secondary | ICD-10-CM | POA: Diagnosis not present

## 2020-11-19 DIAGNOSIS — I2510 Atherosclerotic heart disease of native coronary artery without angina pectoris: Secondary | ICD-10-CM | POA: Diagnosis not present

## 2020-11-19 DIAGNOSIS — I639 Cerebral infarction, unspecified: Secondary | ICD-10-CM | POA: Diagnosis not present

## 2020-11-19 LAB — MAGNESIUM: Magnesium: 1.6 mg/dL — ABNORMAL LOW (ref 1.7–2.4)

## 2020-11-19 LAB — COMPREHENSIVE METABOLIC PANEL
ALT: 15 U/L (ref 0–44)
AST: 16 U/L (ref 15–41)
Albumin: 2.4 g/dL — ABNORMAL LOW (ref 3.5–5.0)
Alkaline Phosphatase: 43 U/L (ref 38–126)
Anion gap: 6 (ref 5–15)
BUN: 19 mg/dL (ref 8–23)
CO2: 24 mmol/L (ref 22–32)
Calcium: 8.6 mg/dL — ABNORMAL LOW (ref 8.9–10.3)
Chloride: 109 mmol/L (ref 98–111)
Creatinine, Ser: 0.95 mg/dL (ref 0.61–1.24)
GFR, Estimated: >60 mL/min (ref >60)
Glucose, Bld: 140 mg/dL — ABNORMAL HIGH (ref 70–99)
Potassium: 3.9 mmol/L (ref 3.5–5.1)
Sodium: 139 mmol/L (ref 135–145)
Total Bilirubin: 0.7 mg/dL (ref 0.3–1.2)
Total Protein: 5.8 g/dL — ABNORMAL LOW (ref 6.5–8.1)

## 2020-11-19 LAB — CBC WITH DIFFERENTIAL/PLATELET
Abs Immature Granulocytes: 0.04 10*3/uL (ref 0.00–0.07)
Basophils Absolute: 0 10*3/uL (ref 0.0–0.1)
Basophils Relative: 1 %
Eosinophils Absolute: 0.3 10*3/uL (ref 0.0–0.5)
Eosinophils Relative: 4 %
HCT: 35.6 % — ABNORMAL LOW (ref 39.0–52.0)
Hemoglobin: 12.2 g/dL — ABNORMAL LOW (ref 13.0–17.0)
Immature Granulocytes: 1 %
Lymphocytes Relative: 29 %
Lymphs Abs: 2.1 10*3/uL (ref 0.7–4.0)
MCH: 32.7 pg (ref 26.0–34.0)
MCHC: 34.3 g/dL (ref 30.0–36.0)
MCV: 95.4 fL (ref 80.0–100.0)
Monocytes Absolute: 1.1 10*3/uL — ABNORMAL HIGH (ref 0.1–1.0)
Monocytes Relative: 16 %
Neutro Abs: 3.6 10*3/uL (ref 1.7–7.7)
Neutrophils Relative %: 49 %
Platelets: 207 10*3/uL (ref 150–400)
RBC: 3.73 MIL/uL — ABNORMAL LOW (ref 4.22–5.81)
RDW: 14.4 % (ref 11.5–15.5)
WBC: 7.2 10*3/uL (ref 4.0–10.5)
nRBC: 0 % (ref 0.0–0.2)

## 2020-11-19 LAB — GLUCOSE, CAPILLARY
Glucose-Capillary: 111 mg/dL — ABNORMAL HIGH (ref 70–99)
Glucose-Capillary: 180 mg/dL — ABNORMAL HIGH (ref 70–99)
Glucose-Capillary: 251 mg/dL — ABNORMAL HIGH (ref 70–99)
Glucose-Capillary: 227 mg/dL — ABNORMAL HIGH (ref 70–99)
Glucose-Capillary: 201 mg/dL — ABNORMAL HIGH (ref 70–99)

## 2020-11-19 LAB — PHOSPHORUS: Phosphorus: 3 mg/dL (ref 2.5–4.6)

## 2020-11-19 MED ORDER — MAGNESIUM SULFATE 2 GM/50ML IV SOLN
2.0000 g | Freq: Once | INTRAVENOUS | Status: AC
  Administered 2020-11-19: 2 g via INTRAVENOUS
  Filled 2020-11-19: qty 50

## 2020-11-19 NOTE — Progress Notes (Signed)
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  Symptoms apparently began 2 days prior to admission and patient was evaluated in the ED.  He underwent MRI which actually showed a right-sided small stroke.  He was hospitalized for further management and he was previously on aspirin Plavix and recently taken off Plavix and started on Eliquis by his cardiologist at the [facility] for diagnosis of atrial fibrillation.  Neurology evaluated for his acute CVA and MRI showed an acute infarct in the right cerebral hemisphere and the patient came in with right-sided weakness and was noted to have slurred speech.  Neurology did not feel that the CVA is related to his symptoms and his right-sided weakness improved.  Patient continues to have persistent left-sided weakness and hemiplegia.  He was evaluated by CIR for candidacy however is not found to be a candidate for now PT OT is recommending SNF and currently being worked up for [facility] SNF.  Currently is being treated for the following but not limited to   Assessment & Plan:   Principal Problem:   Acute CVA (cerebrovascular accident) [facility]) Active Problems:   Hypertension associated with diabetes ([place])   Coronary atherosclerosis   Type 2 diabetes mellitus with complication, with long-term current use of insulin ([place])   AKI (acute kidney injury) ([place])   Hyperlipidemia associated with type 2 diabetes  mellitus ([place])   Seizure disorder ([place])   Depression with anxiety  Right Leg Weakness; ? TIA -Worked up for CVA and had incidental findings as below -VTE [place] done and showed no DVT -In the setting of left-sided ICA stenosis possible syncope -CTA of the head and neck showed left ICA 60 to 70% stenosis -Neurology recommends avoiding dehydration and recommending blood pressure monitoring at home -Neurology also recommends outpatient follow-up with vascular surgery given his evidence of finding a CTA neck -Continue with aspirin and Plavix as he remains off of Eliquis -Right Leg Weakness improved   Acute CVA likely incidental -Heard CT done and showed No acute intracranial abnormality -EEG showed  "This EEG was recorded in the drowsy and sleeping state, the lack of waking state limits the ability to assess for encephalopathy. There was no seizure or seizure predisposition recorded on this study. Please note that lack of epileptiform activity on EEG does not preclude the possibility of epilepsy." -MRI showed acute infarct and read it as "Two punctate foci of acute ischemia within the subcortical right frontal lobe, 1 of which is along the right precentral gyrus. No hemorrhage or mass effect. Advanced white matter disease most consistent with chronic small vessel ischemia." -MRA showed "Normal intracranial MRA." -Patient came in with right-sided weakness which resolved  -Patient has prior history of CVA with residual left-sided hemiplegia.  -Also was noted to have slurred speech.   -Echocardiogram as below.  It showed normal systolic function.  Grade 1 diastolic dysfunction.  Mild LVH was noted.  -Patient was initially continued on Eliquis and aspirin.  After discussions between patient and family and  neurology and review of his previous studies patient has been transitioned to aspirin and Plavix. -Noted to be on atorvastatin as well.  LDL noted to be 52.  HbA1c 7.8.   -CT angiogram head and neck  does 60 presents to notices left ICA and 60% vertebral artery stenosis.  Neurology recommends follow-up with vascular surgery in the outpatient setting.   -Patient also underwent EEG which did not show any epileptiform activity -Seen by PT and OT and SLP.  Inpatient rehabilitation was recommended but his insurance authorization has been denied technology we will pursue SNF and awaiting Insurance Authorization and bed availability   History of paroxysmal atrial fibrillation -Recently started on Eliquis by his cardiologist at the [place]. however apparently patient's 30-day monitor did not show any evidence for atrial fibrillation.  -Initial episode of atrial fibrillation was in the setting of CABG in the postoperative period.  -Neurology feels patient would be better served by being on dual antiplatelet treatment.   -So he has been changed over to aspirin and Plavix after discussions between neurology and the patient and patient's daughter.   -Metoprolol was currently on hold for permissive hypertension but resumed at a lower dose -Blood pressure noted to be low normal.     Left lower extremity edema -Dopplers negative for DVT.  Most likely dependent edema.   Elevated creatinine -Creatinine was noted to be 1.3 on admission.   -Subsequently noted to be normal on repeat and now BUN/Cr is 19/0.95.  Monitor urine output.  Losartan on hold.   -Avoid nephrotoxic medications, contrast dyes, hypotension renally dose medications  Essential Hypertension -Permissive hypertension was allowed.  Losartan and metoprolol were placed on hold.  Blood pressure remains low normal.  -Metoprolol resumed primarily for A. fib at a lower dose.  Continue to hold losartan.   -Continue to monitor blood pressures per protocol last blood pressure reading was 141/70  Diabetes mellitus type 2, insulin-dependent -Noted to be on Toujeo at home.  Currently on glargine insulin.  -HbA1c is 7.8.   -Dose of glargine was adjusted  slightly the day beforeyesterday.  -CBG noted to be 91 yesterday morning.   -We will see how the trends are during the rest of the day and may need to cut back if it remains on the lower side.   -Home medication list shows that he is also supposed to be on -Jardiance and Tradjenta prior to admission.  These are currently on hold.   -CBG's ranging from 111-180  Hypomagnesemia -Patient's Mag Level was 1.6 -Replete with IV Mag Sulfate 2 Grams -Continue to Monitor and Replete as Necessary -Repeat Mag Level in the the AM   Diarrhea -In the setting of Stool Softeners -Afebrile and has no Leukocytosis -Continue to Monitor and Trend   Normocytic Anemia -Patient's Hgb/Hct is relatively stable and gone from 12.7/37.5 -> 12.2/35.6 -Check Anemia Panel in the AM -Continue to Monitor for S/Sx of Bleeding; No overt bleeding noted -Repeat CBC in the AM   History of coronary artery disease status post CABG -Stable.  Continue aspirin, statin, Zetia and low dose Metoprolol   History of Seizure Disorder -Continue Depakote.  Hyperlipidemia -Continue Atorvastatin 80 mg po Daily and Zetia.  History of depression and anxiety -Continue Lexapro doxepin.    DVT prophylaxis: Enoxparin 40 mg sq q24h Code Status: FULL CODE  Family Communication: Discussed with Daughter at bedside Disposition Plan: SNF since Insurance Authorization was denied by Dr. Prudy Feeler   Status is: Inpatient  Remains inpatient appropriate  because:Unsafe d/c plan, IV treatments appropriate due to intensity of illness or inability to take PO, and Inpatient level of care appropriate due to severity of illness  Dispo:  Patient From: Home  Planned Disposition: [place]  Medically stable for discharge: Yes  Consultants:  Neurology  Procedures:  ECHOCARDIOGRAM IMPRESSIONS     1. Left ventricular ejection fraction, by estimation, is 60 to 65%. The  left ventricle has normal function. The left ventricle has no  regional  wall motion abnormalities. There is mild left ventricular hypertrophy.  Left ventricular diastolic parameters  are consistent with Grade I diastolic dysfunction (impaired relaxation).   2. Right ventricular systolic function is normal. The right ventricular  size is normal. There is normal pulmonary artery systolic pressure.   3. Left atrial size was moderately dilated.   4. The mitral valve is normal in structure. No evidence of mitral valve  regurgitation. No evidence of mitral stenosis.   5. The aortic valve is normal in structure. Aortic valve regurgitation is  trivial. Mild to moderate aortic valve sclerosis/calcification is present,  without any evidence of aortic stenosis.   6. The inferior vena cava is normal in size with greater than 50%  respiratory variability, suggesting right atrial pressure of 3 mmHg.   Conclusion(s)/Recommendation(s): No intracardiac source of embolism  detected on this transthoracic study. A transesophageal echocardiogram is  recommended to exclude cardiac source of embolism if clinically indicated.   FINDINGS   Left Ventricle: Left ventricular ejection fraction, by estimation, is 60  to 65%. The left ventricle has normal function. The left ventricle has no  regional wall motion abnormalities. The left ventricular internal cavity  size was normal in size. There is   mild left ventricular hypertrophy. Left ventricular diastolic parameters  are consistent with Grade I diastolic dysfunction (impaired relaxation).   Right Ventricle: The right ventricular size is normal. No increase in  right ventricular wall thickness. Right ventricular systolic function is  normal. There is normal pulmonary artery systolic pressure. The tricuspid  regurgitant velocity is 2.55 m/s, and   with an assumed right atrial pressure of 3 mmHg, the estimated right  ventricular systolic pressure is 0000000 mmHg.   Left Atrium: Left atrial size was moderately dilated.    Right Atrium: Right atrial size was normal in size.   Pericardium: There is no evidence of pericardial effusion.   Mitral Valve: The mitral valve is normal in structure. No evidence of  mitral valve regurgitation. No evidence of mitral valve stenosis.   Tricuspid Valve: The tricuspid valve is normal in structure. Tricuspid  valve regurgitation is not demonstrated. No evidence of tricuspid  stenosis.   Aortic Valve: The aortic valve is normal in structure. Aortic valve  regurgitation is trivial. Mild to moderate aortic valve  sclerosis/calcification is present, without any evidence of aortic  stenosis. Aortic valve mean gradient measures 6.0 mmHg. Aortic  valve peak gradient measures 11.4 mmHg. Aortic valve area, by VTI measures  1.58 cm.   Pulmonic Valve: The pulmonic valve was normal in structure. Pulmonic valve  regurgitation is not visualized. No evidence of pulmonic stenosis.   Aorta: The aortic root is normal in size and structure.   Venous: The inferior vena cava is normal in size with greater than 50%  respiratory variability, suggesting right atrial pressure of 3 mmHg.   IAS/Shunts: No atrial level shunt detected by color flow Doppler.      LEFT VENTRICLE  PLAX 2D  LVIDd:  3.80 cm     Diastology  LVIDs:         2.60 cm     LV e' medial:    6.20 cm/s  LV PW:         1.20 cm     LV E/e' medial:  15.8  LV IVS:        1.10 cm     LV e' lateral:   8.05 cm/s  LVOT diam:     2.00 cm     LV E/e' lateral: 12.2  LV SV:         62  LV SV Index:   35  LVOT Area:     3.14 cm     LV Volumes (MOD)  LV vol d, MOD A2C: 64.0 ml  LV vol d, MOD A4C: 77.4 ml  LV vol s, MOD A2C: 25.8 ml  LV vol s, MOD A4C: 30.0 ml  LV SV MOD A2C:     38.2 ml  LV SV MOD A4C:     77.4 ml  LV SV MOD BP:      44.4 ml   RIGHT VENTRICLE  RV Basal diam:  3.15 cm  RV Mid diam:    3.80 cm  RV S prime:     8.39 cm/s  TAPSE (M-mode): 1.7 cm   LEFT ATRIUM             Index       RIGHT ATRIUM            Index  LA diam:        4.90 cm 2.75 cm/m  RA Area:     11.90 cm  LA Vol (A2C):   56.0 ml 31.43 ml/m RA Volume:   21.40 ml  12.01 ml/m  LA Vol (A4C):   57.5 ml 32.27 ml/m  LA Biplane Vol: 58.6 ml 32.89 ml/m   AORTIC VALVE  AV Area (Vmax):    1.59 cm  AV Area (Vmean):   1.56 cm  AV Area (VTI):     1.58 cm  AV Vmax:           169.00 cm/s  AV Vmean:          115.000 cm/s  AV VTI:            0.392 m  AV Peak Grad:      11.4 mmHg  AV Mean Grad:      6.0 mmHg  LVOT Vmax:         85.50 cm/s  LVOT Vmean:        57.100 cm/s  LVOT VTI:          0.197 m  LVOT/AV VTI ratio: 0.50   MITRAL VALVE                TRICUSPID VALVE  MV Area (PHT): 3.37 cm     TR Peak grad:   26.0 mmHg  MV Decel Time: 225 msec     TR Vmax:        255.00 cm/s  MV E velocity: 98.10 cm/s  MV A velocity: 118.00 cm/s  SHUNTS  MV E/A ratio:  0.83         Systemic VTI:  0.20 m                              Systemic Diam: 2.00 cm   Antimicrobials:  Anti-infectives (From admission,  onward)    None        Subjective: Seen and examined at bedside and he felt okay.  States that he had a good night.  Also states that he has had some diarrhea and had 3 loose stools.  No chest pain or shortness breath.  Still very weak on the left.  Right-sided weakness is improved.  No other concerns or complaints at this time.  Objective: Vitals:   11/18/20 1149 11/18/20 2045 11/19/20 0621 11/19/20 0900  BP: (!) 103/58 (!) 141/70 130/60 (!) 141/70  Pulse: 86 88 75 90  Resp: '18 18 14 16  '$ Temp: 99.1 F (37.3 C) 99.1 F (37.3 C) 97.8 F (36.6 C)   TempSrc: Oral Oral Oral   SpO2: 94% 93% 93% 97%  Weight:      Height:        Intake/Output Summary (Last 24 hours) at 11/19/2020 1402 Last data filed at 11/19/2020 [id number] Gross per 24 hour  Intake --  Output 750 ml  Net -750 ml    Filed Weights   11/13/20 1900 11/13/20 2043  Weight: 82.6 kg 74.8 kg   Examination: Physical Exam:  Constitutional: WN/WD overweight  Caucasian male currently no acute distress appears calm and comfortable  Eyes: Lids and conjunctivae normal, sclerae anicteric  ENMT: External Ears, Nose appear normal. Grossly normal hearing.   Neck: Appears normal, supple, no cervical masses, normal ROM, no appreciable thyromegaly; no JVD Respiratory: Diminished to auscultation bilaterally with coarse breath sounds, no wheezing, rales, rhonchi or crackles. Normal respiratory effort and patient is not tachypenic. No accessory muscle use.  Unlabored breathing Cardiovascular: RRR, no murmurs / rubs / gallops. S1 and S2 auscultated.  Very mild lower extremity edema Abdomen: Soft, non-tender, distended secondary body habitus.  Bowel sounds positive.  GU: Deferred. Musculoskeletal: No clubbing / cyanosis of digits/nails. No joint deformity upper and lower extremities.  Skin: No rashes, lesions, ulcers. No induration; Warm and dry.  Neurologic: CN 2-12 grossly intact with no focal deficits.  Is significantly diminished strength on the left side compared to the right  Psychiatric: Normal judgment and insight. Alert and oriented x 3. Normal mood and appropriate affect.   Data Reviewed: I have personally reviewed following labs and imaging studies  CBC: Recent Labs  Lab 11/13/20 1927 11/13/20 1933 11/15/20 0101 11/17/20 0046 11/19/20 0349  WBC 9.4  --  7.7 10.2 7.2  NEUTROABS 7.0  --   --   --  3.6  HGB 13.7 14.6 13.1 12.7* 12.2*  HCT 42.1 43.0 38.4* 37.5* 35.6*  MCV 98.4  --  95.3 94.7 95.4  PLT 190  --  167 185 [id number]    Basic Metabolic Panel: Recent Labs  Lab 11/13/20 1927 11/13/20 1933 11/15/20 0101 11/17/20 0046 11/19/20 0349  NA 133* 136 137 138 139  K 4.7 4.7 4.0 3.6 3.9  CL 101 102 106 106 109  CO2 24  --  '26 24 24  '$ GLUCOSE 237* 226* 221* 144* 140*  BUN '16 17 15 14 19  '$ CREATININE 1.30* 1.20 1.20 1.04 0.95  CALCIUM 8.9  --  8.7* 8.6* 8.6*  MG  --   --   --   --  1.6*  PHOS  --   --   --   --  3.0    GFR: Estimated  Creatinine Clearance: 53.4 mL/min (by C-G formula based on SCr of 0.95 mg/dL). Liver Function Tests: Recent Labs  Lab 11/13/20 1927 11/19/20 0349  AST 21  16  ALT 20 15  ALKPHOS 59 43  BILITOT 0.6 0.7  PROT 6.5 5.8*  ALBUMIN 3.2* 2.4*    No results for input(s): LIPASE, AMYLASE in the last 168 hours. No results for input(s): AMMONIA in the last 168 hours. Coagulation Profile: Recent Labs  Lab 11/13/20 1927  INR 1.3*    Cardiac Enzymes: No results for input(s): CKTOTAL, CKMB, CKMBINDEX, TROPONINI in the last 168 hours. BNP (last 3 results) No results for input(s): PROBNP in the last 8760 hours. HbA1C: No results for input(s): HGBA1C in the last 72 hours. CBG: Recent Labs  Lab 11/18/20 1127 11/18/20 1629 11/18/20 2128 11/19/20 0746 11/19/20 1147  GLUCAP 139* 141* 156* 111* 180*    Lipid Profile: No results for input(s): CHOL, HDL, LDLCALC, TRIG, CHOLHDL, LDLDIRECT in the last 72 hours. Thyroid Function Tests: No results for input(s): TSH, T4TOTAL, FREET4, T3FREE, THYROIDAB in the last 72 hours. Anemia Panel: No results for input(s): VITAMINB12, FOLATE, FERRITIN, TIBC, IRON, RETICCTPCT in the last 72 hours. Sepsis Labs: No results for input(s): PROCALCITON, LATICACIDVEN in the last 168 hours.  Recent Results (from the past 240 hour(s))  Resp Panel by RT-PCR (Flu A&B, Covid) Nasopharyngeal Swab     Status: None   Collection Time: 11/13/20  7:39 PM   Specimen: Nasopharyngeal Swab; Nasopharyngeal(NP) swabs in vial transport medium  Result Value Ref Range Status   SARS Coronavirus 2 by RT PCR NEGATIVE NEGATIVE Final    Comment: (NOTE) SARS-CoV-2 target nucleic acids are NOT DETECTED.  The SARS-CoV-2 RNA is generally detectable in upper respiratory specimens during the acute phase of infection. The lowest concentration of SARS-CoV-2 viral copies this assay can detect is 138 copies/mL. A negative result does not preclude SARS-Cov-2 infection and should not be used  as the sole basis for treatment or other patient management decisions. A negative result may occur with  improper specimen collection/handling, submission of specimen other than nasopharyngeal swab, presence of viral mutation(s) within the areas targeted by this assay, and inadequate number of viral copies(<138 copies/mL). A negative result must be combined with clinical observations, patient history, and epidemiological information. The expected result is Negative.  Fact Sheet for Patients:  EntrepreneurPulse.com.au  Fact Sheet for Healthcare Providers:  IncredibleEmployment.be  This test is no t yet approved or cleared by the [place] FDA and  has been authorized for detection and/or diagnosis of SARS-CoV-2 by FDA under an Emergency Use Authorization (EUA). This EUA will remain  in effect (meaning this test can be used) for the duration of the COVID-19 declaration under Section 564(b)(1) of the Act, 21 U.S.C.section 360bbb-3(b)(1), unless the authorization is terminated  or revoked sooner.       Influenza A by PCR NEGATIVE NEGATIVE Final   Influenza B by PCR NEGATIVE NEGATIVE Final    Comment: (NOTE) The Xpert Xpress SARS-CoV-2/FLU/RSV plus assay is intended as an aid in the diagnosis of influenza from Nasopharyngeal swab specimens and should not be used as a sole basis for treatment. Nasal washings and aspirates are unacceptable for Xpert Xpress SARS-CoV-2/FLU/RSV testing.  Fact Sheet for Patients: EntrepreneurPulse.com.au  Fact Sheet for Healthcare Providers: IncredibleEmployment.be  This test is not yet approved or cleared by the [place] FDA and has been authorized for detection and/or diagnosis of SARS-CoV-2 by FDA under an Emergency Use Authorization (EUA). This EUA will remain in effect (meaning this test can be used) for the duration of the COVID-19 declaration under Section 564(b)(1)  of the Act, 21 U.S.C. section  360bbb-3(b)(1), unless the authorization is terminated or revoked.  Performed at [facility], [place] [address], [place], [place] [address]      RN Pressure Injury Documentation:     Estimated body mass index is 29.23 kg/m as calculated from the following:   Height as of this encounter: '5\' 3"'$  (1.6 m).   Weight as of this encounter: 74.8 kg.  Malnutrition Type:   Malnutrition Characteristics:  Nutrition Interventions:     Radiology Studies: No results found.  Scheduled Meds:   stroke: mapping our early stages of recovery book   Does not apply Once   allopurinol  300 mg Oral Daily   aspirin EC  81 mg Oral Daily   atorvastatin  80 mg Oral Daily   baclofen  10 mg Oral BID   clopidogrel  75 mg Oral Daily   divalproex  500 mg Oral QHS   doxepin  25 mg Oral QHS   enoxaparin (LOVENOX) injection  40 mg Subcutaneous Q24H   escitalopram  10 mg Oral Daily   ezetimibe  10 mg Oral Daily   insulin aspart  0-9 Units Subcutaneous TID WC   insulin glargine-yfgn  24 Units Subcutaneous QHS   metoprolol succinate  12.5 mg Oral Daily   Continuous Infusions:   LOS: 5 days   Kerney Elbe, DO [facility] PAGER is on AMION  If 7PM-7AM, please contact night-coverage [email or web address]

## 2020-11-19 NOTE — TOC Progression Note (Addendum)
Transition of Care [facility]) - Progression Note    Patient Details  Name: Fred [name] MRN: [id number] Date of Birth: [birth year]  Transition of Care [facility]) CM/SW Contact  Joanne Chars, LCSW Phone Number: 11/19/2020, 9:04 AM  Clinical Narrative:     Level 2 PASSR docs uploaded to [facility].  CSW spoke with Cobi Dalbert Batman at [facility] at daughter's request to inquire about SNF beds and LTC beds.  Cobi reports that she does not currently have LTC beds available and that CSW would need to check with Whitney regarding rehab beds.    0920: CSW spoke with [facility].  Pt PCP is Landry Mellow, [place] [phone].  CSW LM with Jasmine regarding SNF approval.   1020: [facility].    1140: TC Cyndee Brightly, [place] CSW.  She confirmed that once the referral is reviewed and approved, [facility] will send list of [facility] options.  1155: CSW messaged Whitney[facility] who confirmed that she does not have rehab beds available.  1240: TC Donna/daughter.  She is requesting referral be sent to [facility].  Admissions person is Thurston Hole [phone].  CSW spoke with Beverlee Nims, fax# [phone].  Referral faxed.  12 TC LM with Diana/[place] to confirm she received fax.Beverlee Nims cell: [phone].  [id number]: Pt PASSR received: [id number]  1540: SNF auth request made to [facility].     Expected Discharge Plan: [place] Barriers to Discharge: Continued Medical Work up, SNF Pending bed offer  Expected Discharge Plan and Services Expected Discharge Plan: [place] Choice: [place] arrangements for the past 2 months: Single Family Home                                       Social Determinants of Health (SDOH) Interventions    Readmission Risk Interventions No flowsheet data found.

## 2020-11-19 NOTE — Progress Notes (Signed)
Occupational Therapy Treatment Patient Details Name: KIRKLIN BARNO MRN: [id number] DOB: Feb 05, 1936 Today's Date: 11/19/2020    History of present illness Pt is an 85 y/o male admitted 9/2 secondary to new onset R weakness. Found to have R frontal lobe infarct. 9/3 rapid response contacted for confusion and disorientation. PMH includes HTN, DM, seizures, a fib, CVA with L sided weakness, CHF, and CAD.   OT comments  Patient in bed with daughter in room.  Patient agreeable to sitting on EOB.  Patient was mod assist to get to eob with complaints of LLE pain. Provided patient with HEP and theraband and address RUE strengthening while seated on EOB with occasional assist for balance. Patient declined transfer to chair and was positioned back in bed with mod assist. Acute OT to continue to follow.   Follow Up Recommendations  CIR    Equipment Recommendations  None recommended by OT    Recommendations for Other Services Rehab consult    Precautions / Restrictions Precautions Precautions: Fall;Other (comment) Precaution Comments: left hemiplegia Restrictions Weight Bearing Restrictions: No       Mobility Bed Mobility Overal bed mobility: Needs Assistance Bed Mobility: Supine to Sit;Sit to Supine     Supine to sit: Mod assist Sit to supine: Mod assist   General bed mobility comments: Patient complainted of LLE pain during bed mobility.    Transfers                      Balance Overall balance assessment: Needs assistance Sitting-balance support: Single extremity supported;Feet supported Sitting balance-Leahy Scale: Fair Sitting balance - Comments: prefers use of R UE, but fairly steady EOB                                   ADL either performed or assessed with clinical judgement   ADL                                               Vision       Perception     Praxis      Cognition Arousal/Alertness: Awake/alert Behavior  During Therapy: WFL for tasks assessed/performed Overall Cognitive Status: Within Functional Limits for tasks assessed Area of Impairment: Orientation                 Orientation Level: Disoriented to;Time             General Comments: Patient required cueing for ortientation to time        Exercises Exercises: General Upper Extremity General Exercises - Upper Extremity Shoulder Flexion: Strengthening;Right;10 reps;Theraband Theraband Level (Shoulder Flexion): Level 2 (Red) Shoulder ABduction: Strengthening;Right;10 reps;Theraband Theraband Level (Shoulder Abduction): Level 2 (Red) Elbow Flexion: Strengthening;Right;10 reps;Theraband Theraband Level (Elbow Flexion): Level 2 (Red) Elbow Extension: Strengthening;Right;10 reps;Theraband Theraband Level (Elbow Extension): Level 2 (Red)   Shoulder Instructions       General Comments      Pertinent Vitals/ Pain       Pain Assessment: Faces Faces Pain Scale: Hurts little more Pain Location: left leg Pain Descriptors / Indicators: Grimacing;Guarding Pain Intervention(s): Limited activity within patient's tolerance;Monitored during session  Home Living  Prior Functioning/Environment              Frequency  Min 2X/week        Progress Toward Goals  OT Goals(current goals can now be found in the care plan section)  Progress towards OT goals: Progressing toward goals  Acute Rehab OT Goals Patient Stated Goal: get back to where I was OT Goal Formulation: With patient Time For Goal Achievement: 11/30/20 Potential to Achieve Goals: Good ADL Goals Pt Will Perform Grooming: with set-up;sitting Pt Will Perform Upper Body Bathing: with set-up;sitting Pt Will Perform Upper Body Dressing: with set-up;sitting Pt Will Transfer to Toilet: with supervision;squat pivot transfer Pt/caregiver will Perform Home Exercise Program: Increased strength;Right Upper  extremity;With theraband;With Supervision;With written HEP provided  Plan Discharge plan remains appropriate    Co-evaluation                 AM-PAC OT "6 Clicks" Daily Activity     Outcome Measure   Help from another person eating meals?: A Little Help from another person taking care of personal grooming?: A Little Help from another person toileting, which includes using toliet, bedpan, or urinal?: A Lot Help from another person bathing (including washing, rinsing, drying)?: A Lot Help from another person to put on and taking off regular upper body clothing?: A Little Help from another person to put on and taking off regular lower body clothing?: A Lot 6 Click Score: 15    End of Session    OT Visit Diagnosis: Unsteadiness on feet (R26.81);Muscle weakness (generalized) (M62.81);Hemiplegia and hemiparesis Hemiplegia - Right/Left: Left Hemiplegia - dominant/non-dominant: Non-Dominant Hemiplegia - caused by: Cerebral infarction   Activity Tolerance Patient tolerated treatment well   Patient Left in bed;with call bell/phone within reach;with family/visitor present   Nurse Communication Other (comment) (informed on performance seated on eob)        Time: 1032-1100 OT Time Calculation (min): 28 min  Charges: OT General Charges $OT Visit: 1 Visit OT Treatments $Therapeutic Activity: 8-22 mins $Therapeutic Exercise: 8-22 mins  Lodema Hong, OTA    Cherron Blitzer Alexis Goodell 11/19/2020, 11:43 AM

## 2020-11-20 ENCOUNTER — Inpatient Hospital Stay (HOSPITAL_COMMUNITY): Payer: Medicare Other

## 2020-11-20 DIAGNOSIS — U071 COVID-19: Secondary | ICD-10-CM

## 2020-11-20 DIAGNOSIS — F418 Other specified anxiety disorders: Secondary | ICD-10-CM

## 2020-11-20 DIAGNOSIS — I2510 Atherosclerotic heart disease of native coronary artery without angina pectoris: Secondary | ICD-10-CM | POA: Diagnosis not present

## 2020-11-20 DIAGNOSIS — N179 Acute kidney failure, unspecified: Secondary | ICD-10-CM | POA: Diagnosis not present

## 2020-11-20 DIAGNOSIS — I639 Cerebral infarction, unspecified: Secondary | ICD-10-CM | POA: Diagnosis not present

## 2020-11-20 LAB — CBC WITH DIFFERENTIAL/PLATELET
Abs Immature Granulocytes: 0.05 10*3/uL (ref 0.00–0.07)
Basophils Absolute: 0 10*3/uL (ref 0.0–0.1)
Basophils Relative: 1 %
Eosinophils Absolute: 0.3 10*3/uL (ref 0.0–0.5)
Eosinophils Relative: 4 %
HCT: 34.8 % — ABNORMAL LOW (ref 39.0–52.0)
Hemoglobin: 11.7 g/dL — ABNORMAL LOW (ref 13.0–17.0)
Immature Granulocytes: 1 %
Lymphocytes Relative: 33 %
Lymphs Abs: 2.4 10*3/uL (ref 0.7–4.0)
MCH: 32.1 pg (ref 26.0–34.0)
MCHC: 33.6 g/dL (ref 30.0–36.0)
MCV: 95.3 fL (ref 80.0–100.0)
Monocytes Absolute: 0.9 10*3/uL (ref 0.1–1.0)
Monocytes Relative: 12 %
Neutro Abs: 3.6 10*3/uL (ref 1.7–7.7)
Neutrophils Relative %: 49 %
Platelets: 222 10*3/uL (ref 150–400)
RBC: 3.65 MIL/uL — ABNORMAL LOW (ref 4.22–5.81)
RDW: 14.3 % (ref 11.5–15.5)
WBC: 7.2 10*3/uL (ref 4.0–10.5)
nRBC: 0 % (ref 0.0–0.2)

## 2020-11-20 LAB — FIBRINOGEN: Fibrinogen: 587 mg/dL — ABNORMAL HIGH (ref 210–475)

## 2020-11-20 LAB — COMPREHENSIVE METABOLIC PANEL
ALT: 17 U/L (ref 0–44)
AST: 16 U/L (ref 15–41)
Albumin: 2.4 g/dL — ABNORMAL LOW (ref 3.5–5.0)
Alkaline Phosphatase: 45 U/L (ref 38–126)
Anion gap: 5 (ref 5–15)
BUN: 17 mg/dL (ref 8–23)
CO2: 25 mmol/L (ref 22–32)
Calcium: 8.5 mg/dL — ABNORMAL LOW (ref 8.9–10.3)
Chloride: 109 mmol/L (ref 98–111)
Creatinine, Ser: 1.13 mg/dL (ref 0.61–1.24)
GFR, Estimated: >60 mL/min (ref >60)
Glucose, Bld: 130 mg/dL — ABNORMAL HIGH (ref 70–99)
Potassium: 4 mmol/L (ref 3.5–5.1)
Sodium: 139 mmol/L (ref 135–145)
Total Bilirubin: 0.4 mg/dL (ref 0.3–1.2)
Total Protein: 5.6 g/dL — ABNORMAL LOW (ref 6.5–8.1)

## 2020-11-20 LAB — GLUCOSE, CAPILLARY
Glucose-Capillary: 111 mg/dL — ABNORMAL HIGH (ref 70–99)
Glucose-Capillary: 181 mg/dL — ABNORMAL HIGH (ref 70–99)
Glucose-Capillary: 163 mg/dL — ABNORMAL HIGH (ref 70–99)
Glucose-Capillary: 145 mg/dL — ABNORMAL HIGH (ref 70–99)
Glucose-Capillary: 154 mg/dL — ABNORMAL HIGH (ref 70–99)

## 2020-11-20 LAB — PHOSPHORUS: Phosphorus: 3.3 mg/dL (ref 2.5–4.6)

## 2020-11-20 LAB — MAGNESIUM: Magnesium: 1.8 mg/dL (ref 1.7–2.4)

## 2020-11-20 LAB — RESP PANEL BY RT-PCR (FLU A&B, COVID) ARPGX2
Influenza A by PCR: NEGATIVE
Influenza B by PCR: NEGATIVE
SARS Coronavirus 2 by RT PCR: POSITIVE — AB

## 2020-11-20 LAB — SEDIMENTATION RATE: Sed Rate: 24 mm/hr — ABNORMAL HIGH (ref 0–16)

## 2020-11-20 LAB — D-DIMER, QUANTITATIVE: D-Dimer, Quant: 0.6 ug/mL-FEU — ABNORMAL HIGH (ref 0.00–0.50)

## 2020-11-20 LAB — LACTATE DEHYDROGENASE: LDH: 138 U/L (ref 98–192)

## 2020-11-20 LAB — FERRITIN: Ferritin: 89 ng/mL (ref 24–336)

## 2020-11-20 LAB — C-REACTIVE PROTEIN: CRP: 4.2 mg/dL — ABNORMAL HIGH (ref <1.0)

## 2020-11-20 MED ORDER — MAGNESIUM SULFATE 2 GM/50ML IV SOLN
2.0000 g | Freq: Once | INTRAVENOUS | Status: AC
  Administered 2020-11-20: 2 g via INTRAVENOUS
  Filled 2020-11-20: qty 50

## 2020-11-20 MED ORDER — ZINC SULFATE 220 (50 ZN) MG PO CAPS
220.0000 mg | ORAL_CAPSULE | Freq: Every day | ORAL | Status: DC
  Administered 2020-11-20 – 2020-12-01 (×12): 220 mg via ORAL
  Filled 2020-11-20 (×12): qty 1

## 2020-11-20 MED ORDER — INSULIN ASPART 100 UNIT/ML IJ SOLN
3.0000 [IU] | Freq: Three times a day (TID) | INTRAMUSCULAR | Status: DC
  Administered 2020-11-20 – 2020-11-26 (×8): 3 [IU] via SUBCUTANEOUS

## 2020-11-20 MED ORDER — IPRATROPIUM-ALBUTEROL 0.5-2.5 (3) MG/3ML IN SOLN
3.0000 mL | Freq: Four times a day (QID) | RESPIRATORY_TRACT | Status: DC | PRN
  Filled 2020-11-20: qty 3

## 2020-11-20 MED ORDER — ASCORBIC ACID 500 MG PO TABS
500.0000 mg | ORAL_TABLET | Freq: Every day | ORAL | Status: DC
  Administered 2020-11-20 – 2020-12-01 (×12): 500 mg via ORAL
  Filled 2020-11-20 (×12): qty 1

## 2020-11-20 MED ORDER — SODIUM CHLORIDE 0.9 % IV SOLN
100.0000 mg | Freq: Every day | INTRAVENOUS | Status: AC
  Administered 2020-11-21 – 2020-11-24 (×4): 100 mg via INTRAVENOUS
  Filled 2020-11-20: qty 100
  Filled 2020-11-20: qty 20
  Filled 2020-11-20: qty 100
  Filled 2020-11-20: qty 20

## 2020-11-20 MED ORDER — SODIUM CHLORIDE 0.9 % IV SOLN
200.0000 mg | Freq: Once | INTRAVENOUS | Status: AC
  Administered 2020-11-20: 200 mg via INTRAVENOUS
  Filled 2020-11-20: qty 40

## 2020-11-20 NOTE — Plan of Care (Signed)
  Problem: Activity: Goal: Risk for activity intolerance will decrease Outcome: Progressing   

## 2020-11-20 NOTE — Progress Notes (Signed)
Inpatient Diabetes Program Recommendations  AACE/ADA: New Consensus Statement on Inpatient Glycemic Control (2015)  Target Ranges:  Prepandial:   less than 140 mg/dL      Peak postprandial:   less than 180 mg/dL (1-2 hours)      Critically ill patients:  140 - 180 mg/dL   Lab Results  Component Value Date   GLUCAP 111 (H) 11/20/2020   HGBA1C 7.8 (H) 11/14/2020    Review of Glycemic Control Results for CORDEN, LAY" (MRN [id number]) as of 11/20/2020 11:01  Ref. Range 11/19/2020 11:47 11/19/2020 16:10 11/19/2020 18:22 11/19/2020 20:32 11/20/2020 07:49  Glucose-Capillary Latest Ref Range: 70 - 99 mg/dL 180 (H) 251 (H) 227 (H) 201 (H) 111 (H)   Diabetes history: DM 2 Outpatient Diabetes medications:  Toujeo 22 units q HS, Jardiance 25 mg daily, Tradjenta 5 mg daily Current orders for Inpatient glycemic control:  Novolog sensitive tid with meals, Semglee 24 units q HS Inpatient Diabetes Program Recommendations:    Please consider adding Novolog 3 units tid with meals (hold if patient eats less than 50% or NPO).   Thanks,  Adah Perl, RN, BC-ADM Inpatient Diabetes Coordinator Pager [phone]  (8a-5p)

## 2020-11-20 NOTE — Progress Notes (Signed)
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  Symptoms apparently began 2 days prior to admission and patient was evaluated in the ED.  He underwent MRI which actually showed a right-sided small stroke.  He was hospitalized for further management and he was previously on aspirin Plavix and recently taken off Plavix and started on Eliquis by his cardiologist at the [facility] for diagnosis of atrial fibrillation.  Neurology evaluated for his acute CVA and MRI showed an acute infarct in the right cerebral hemisphere and the patient came in with right-sided weakness and was noted to have slurred speech.  Neurology did not feel that the CVA is related to his symptoms and his right-sided weakness improved.  Patient continues to have persistent left-sided weakness and hemiplegia.  He was evaluated by CIR for candidacy however is not found to be a candidate for now PT OT is recommending SNF and currently being worked up for [facility] SNF and was going to be discharged to skilled nursing facility however he incidentally tested positive for COVID-19 and so will need 10-day isolation and will start him on remdesivir.  Currently is being treated for the following but not limited to   Assessment & Plan:   Principal Problem:   Acute CVA (cerebrovascular accident) [facility]) Active Problems:   Hypertension associated with diabetes ([place])   Coronary  atherosclerosis   Type 2 diabetes mellitus with complication, with long-term current use of insulin ([place])   AKI (acute kidney injury) ([place])   Hyperlipidemia associated with type 2 diabetes mellitus ([place])   Seizure disorder ([place])   Depression with anxiety  Right Leg Weakness; ? TIA -Worked up for CVA and had incidental findings as below -VTE [place] done and showed no DVT -In the setting of left-sided ICA stenosis possible syncope -CTA of the head and neck showed left ICA 60 to 70% stenosis -Neurology recommends avoiding dehydration and recommending blood pressure monitoring at home -Neurology also recommends outpatient follow-up with vascular surgery given his evidence of finding a CTA neck -Continue with aspirin and Plavix as he remains off of Eliquis -Right Leg Weakness improved   COVID-19 disease -Was not symptomatic but is having some diarrhea that could be attributed to COVID -Check inflammatory markers and trend daily -Patient has had both vaccines and booster  Recent Labs    11/20/20 1236  DDIMER 0.60*  FERRITIN 89  LDH 138  CRP 4.2*    Lab Results  Component Value Date   SARSCOV2NAA POSITIVE (A) 11/20/2020   [place] NEGATIVE 11/13/2020   [place] NEGATIVE 11/24/2018  -SpO2: 96 % on ROOM AIR -Currently not symptomatic but patient's daughter does state that he does cough with his food and has had some intermittent wheezing -Check chest x-ray -Start Remdesivir Course -Airborne and contact precaution -Zinc and vitamin C -We will add Combivent 1 puff every 6 as needed  Acute CVA likely incidental -Heard CT done and showed No acute intracranial abnormality -EEG showed  "This EEG was recorded in the drowsy and  sleeping state, the lack of waking state limits the ability to assess for encephalopathy. There was no seizure or seizure predisposition recorded on this study. Please note that lack of epileptiform activity on EEG does not preclude the possibility of  epilepsy." -MRI showed acute infarct and read it as "Two punctate foci of acute ischemia within the subcortical right frontal lobe, 1 of which is along the right precentral gyrus. No hemorrhage or mass effect. Advanced white matter disease most consistent with chronic small vessel ischemia." -MRA showed "Normal intracranial MRA." -Patient came in with right-sided weakness which resolved  -Patient has prior history of CVA with residual left-sided hemiplegia.  -Also was noted to have slurred speech.   -Echocardiogram as below.  It showed normal systolic function.  Grade 1 diastolic dysfunction.  Mild LVH was noted.  -Patient was initially continued on Eliquis and aspirin.  After discussions between patient and family and neurology and review of his previous studies patient has been transitioned to aspirin and Plavix. -Noted to be on atorvastatin as well.  LDL noted to be 52.  HbA1c 7.8.   -CT angiogram head and neck does 60 presents to notices left ICA and 60% vertebral artery stenosis.  Neurology recommends follow-up with vascular surgery in the outpatient setting.   -Patient also underwent EEG which did not show any epileptiform activity -Seen by PT and OT and SLP.  Inpatient rehabilitation was recommended but his insurance authorization has been denied technology we will pursue SNF and awaiting Insurance Authorization and bed availability and he was also to be discharged today however on repeat COVID testing he tested positive so we will need to be isolated for 10 days hard to be discharging to SNF  History of paroxysmal atrial fibrillation -Recently started on Eliquis by his cardiologist at the [facility]. however apparently patient's 30-day monitor did not show any evidence for atrial fibrillation.  -Initial episode of atrial fibrillation was in the setting of CABG in the postoperative period.  -Neurology feels patient would be better served by being on dual antiplatelet treatment.   -So he has been  changed over to aspirin and Plavix after discussions between neurology and the patient and patient's daughter.   -Metoprolol was currently on hold for permissive hypertension but resumed at a lower dose -Blood pressure noted to be low normal.     Left lower extremity edema -Dopplers negative for DVT.  Most likely dependent edema.   Elevated creatinine -Creatinine was noted to be 1.3 on admission.   -Subsequently noted to be normal on repeat and now BUN/Cr is 19/0.95 yesterday and today is 17/1.13.  Monitor urine output.  Losartan on hold.   -Avoid nephrotoxic medications, contrast dyes, hypotension renally dose medications  Essential Hypertension -Permissive hypertension was allowed.  Losartan and metoprolol were placed on hold.  Blood pressure remains low normal.  -Metoprolol resumed primarily for A. fib at a lower dose.  Continue to hold losartan.   -Continue to monitor blood pressures per protocol last blood pressure reading was 141/70  Diabetes mellitus type 2, insulin-dependent -Noted to be on Toujeo at home.  Currently on glargine insulin.  -HbA1c is 7.8.   -Dose of glargine was adjusted slightly the day beforeyesterday.  -CBG noted to be 91 yesterday morning.   -We will see how the trends are during the rest of the day and may need to cut back if it remains on the lower side.   -Home medication list shows that he is also supposed to be  on -Jardiance and Tradjenta prior to admission.  These are currently on hold.   -We will add NovoLog 3 units 3 times daily with meals if he eats greater than 50% of his meals -CBG's ranging from 111-180  Hypomagnesemia -Patient's Mag Level was 1.8 -Replete with IV Mag Sulfate 2 Grams -Continue to Monitor and Replete as Necessary -Repeat Mag Level in the the AM   Diarrhea -In the setting of Stool Softeners initially but they have been stopped and now in the setting of likely COVID-19 disease -Afebrile and has no Leukocytosis -Continue to Monitor  and Trend   Normocytic Anemia -Patient's Hgb/Hct is relatively stable but slowly dropping as it has gone from 12.7/37.5 -> 12.2/35.6 and is now 11.7/34.4 -Check Anemia Panel in the AM -Continue to Monitor for S/Sx of Bleeding; No overt bleeding noted -Repeat CBC in the AM   History of Coronary Artery Disease status post CABG -Stable. Continue aspirin, statin, Zetia and low dose Metoprolol   History of Seizure Disorder -Continue Divalproex 500 mg po qHS.  Hyperlipidemia -Continue Atorvastatin 80 mg po Daily and Zetia.  History of depression and anxiety -Continue Esctialopram 10 mg po Daily and Doxepin 25 mg qHS  DVT prophylaxis: Enoxparin 40 mg sq q24h Code Status: FULL CODE  Family Communication: Discussed with Daughter over the telephone and granddaughter at bedside Disposition Plan: SNF   Status is: Inpatient  Remains inpatient appropriate because:Unsafe d/c plan, IV treatments appropriate due to intensity of illness or inability to take PO, and Inpatient level of care appropriate due to severity of illness  Dispo:  Patient From: Home  Planned Disposition: [employer]  Medically stable for discharge: Yes  Consultants:  Neurology  Procedures:  ECHOCARDIOGRAM IMPRESSIONS     1. Left ventricular ejection fraction, by estimation, is 60 to 65%. The  left ventricle has normal function. The left ventricle has no regional  wall motion abnormalities. There is mild left ventricular hypertrophy.  Left ventricular diastolic parameters  are consistent with Grade I diastolic dysfunction (impaired relaxation).   2. Right ventricular systolic function is normal. The right ventricular  size is normal. There is normal pulmonary artery systolic pressure.   3. Left atrial size was moderately dilated.   4. The mitral valve is normal in structure. No evidence of mitral valve  regurgitation. No evidence of mitral stenosis.   5. The aortic valve is normal in structure.  Aortic valve regurgitation is  trivial. Mild to moderate aortic valve sclerosis/calcification is present,  without any evidence of aortic stenosis.   6. The inferior vena cava is normal in size with greater than 50%  respiratory variability, suggesting right atrial pressure of 3 mmHg.   Conclusion(s)/Recommendation(s): No intracardiac source of embolism  detected on this transthoracic study. A transesophageal echocardiogram is  recommended to exclude cardiac source of embolism if clinically indicated.   FINDINGS   Left Ventricle: Left ventricular ejection fraction, by estimation, is 60  to 65%. The left ventricle has normal function. The left ventricle has no  regional wall motion abnormalities. The left ventricular internal cavity  size was normal in size. There is   mild left ventricular hypertrophy. Left ventricular diastolic parameters  are consistent with Grade I diastolic dysfunction (impaired relaxation).   Right Ventricle: The right ventricular size is normal. No increase in  right ventricular wall thickness. Right ventricular systolic function is  normal. There is normal pulmonary artery systolic pressure. The tricuspid  regurgitant velocity is 2.55 m/s, and  with an assumed right atrial pressure of 3 mmHg, the estimated right  ventricular systolic pressure is 0000000 mmHg.   Left Atrium: Left atrial size was moderately dilated.   Right Atrium: Right atrial size was normal in size.   Pericardium: There is no evidence of pericardial effusion.   Mitral Valve: The mitral valve is normal in structure. No evidence of  mitral valve regurgitation. No evidence of mitral valve stenosis.   Tricuspid Valve: The tricuspid valve is normal in structure. Tricuspid  valve regurgitation is not demonstrated. No evidence of tricuspid  stenosis.   Aortic Valve: The aortic valve is normal in structure. Aortic valve  regurgitation is trivial. Mild to moderate aortic valve   sclerosis/calcification is present, without any evidence of aortic  stenosis. Aortic valve mean gradient measures 6.0 mmHg. Aortic  valve peak gradient measures 11.4 mmHg. Aortic valve area, by VTI measures  1.58 cm.   Pulmonic Valve: The pulmonic valve was normal in structure. Pulmonic valve  regurgitation is not visualized. No evidence of pulmonic stenosis.   Aorta: The aortic root is normal in size and structure.   Venous: The inferior vena cava is normal in size with greater than 50%  respiratory variability, suggesting right atrial pressure of 3 mmHg.   IAS/Shunts: No atrial level shunt detected by color flow Doppler.      LEFT VENTRICLE  PLAX 2D  LVIDd:         3.80 cm     Diastology  LVIDs:         2.60 cm     LV e' medial:    6.20 cm/s  LV PW:         1.20 cm     LV E/e' medial:  15.8  LV IVS:        1.10 cm     LV e' lateral:   8.05 cm/s  LVOT diam:     2.00 cm     LV E/e' lateral: 12.2  LV SV:         62  LV SV Index:   35  LVOT Area:     3.14 cm     LV Volumes (MOD)  LV vol d, MOD A2C: 64.0 ml  LV vol d, MOD A4C: 77.4 ml  LV vol s, MOD A2C: 25.8 ml  LV vol s, MOD A4C: 30.0 ml  LV SV MOD A2C:     38.2 ml  LV SV MOD A4C:     77.4 ml  LV SV MOD BP:      44.4 ml   RIGHT VENTRICLE  RV Basal diam:  3.15 cm  RV Mid diam:    3.80 cm  RV S prime:     8.39 cm/s  TAPSE (M-mode): 1.7 cm   LEFT ATRIUM             Index       RIGHT ATRIUM           Index  LA diam:        4.90 cm 2.75 cm/m  RA Area:     11.90 cm  LA Vol (A2C):   56.0 ml 31.43 ml/m RA Volume:   21.40 ml  12.01 ml/m  LA Vol (A4C):   57.5 ml 32.27 ml/m  LA Biplane Vol: 58.6 ml 32.89 ml/m   AORTIC VALVE  AV Area (Vmax):    1.59 cm  AV Area (Vmean):   1.56 cm  AV Area (VTI):  1.58 cm  AV Vmax:           169.00 cm/s  AV Vmean:          115.000 cm/s  AV VTI:            0.392 m  AV Peak Grad:      11.4 mmHg  AV Mean Grad:      6.0 mmHg  LVOT Vmax:         85.50 cm/s  LVOT Vmean:         57.100 cm/s  LVOT VTI:          0.197 m  LVOT/AV VTI ratio: 0.50   MITRAL VALVE                TRICUSPID VALVE  MV Area (PHT): 3.37 cm     TR Peak grad:   26.0 mmHg  MV Decel Time: 225 msec     TR Vmax:        255.00 cm/s  MV E velocity: 98.10 cm/s  MV A velocity: 118.00 cm/s  SHUNTS  MV E/A ratio:  0.83         Systemic VTI:  0.20 m                              Systemic Diam: 2.00 cm   Antimicrobials:  Anti-infectives (From admission, onward)    None        Subjective: Seen and examined at bedside a and he is saying his left leg is hurting today.  No chest pain or shortness of breath.  Still having some loose stools.  Denies any other concerns or complaints at this time and was to be discharged however tested positive for COVID so now is in isolation  Objective: Vitals:   11/19/20 1440 11/19/20 2033 11/20/20 0300 11/20/20 0920  BP: (!) 114/55 122/60 121/63 (!) 150/56  Pulse: 93 83 71 73  Resp: '17 18 18 16  '$ Temp: 100 F (37.8 C) 98.9 F (37.2 C) 98 F (36.7 C) 98.4 F (36.9 C)  TempSrc: Oral Oral Oral Oral  SpO2: 96% 95% 96% 96%  Weight:      Height:       No intake or output data in the 24 hours ending 11/20/20 1514  Filed Weights   11/13/20 1900 11/13/20 2043  Weight: 82.6 kg 74.8 kg   Examination: Physical Exam:  Constitutional: WN/WD overweight Caucasian male in NAD and appears calm and comfortable Eyes: Lids and conjunctivae normal, sclerae anicteric  ENMT: External Ears, Nose appear normal. Grossly normal hearing.  Neck: Appears normal, supple, no cervical masses, normal ROM, no appreciable thyromegaly; no appreciable JVD Respiratory: Diminished to auscultation bilaterally with coarse breath sounds, no wheezing, rales, rhonchi or crackles. Normal respiratory effort and patient is not tachypenic. No accessory muscle use.  Unlabored breathing Cardiovascular: RRR, no murmurs / rubs / gallops. S1 and S2 auscultated.  Very trace lower extremity edema Abdomen:  Soft, non-tender, distended secondary body habitus. Bowel sounds positive.  GU: Deferred. Musculoskeletal: No clubbing / cyanosis of digits/nails. No joint deformity upper and lower extremities but left arm has a contracture Skin: No rashes, lesions, ulcers. No induration; Warm and dry.  Neurologic: CN 2-12 grossly intact with no focal deficits. S strength is significantly diminished on the left side compared to the right and upper and lower extremity  Psychiatric: Normal judgment and insight. Alert and oriented x 3.  Normal mood and appropriate affect.   Data Reviewed: I have personally reviewed following labs and imaging studies  CBC: Recent Labs  Lab 11/13/20 1927 11/13/20 1933 11/15/20 0101 11/17/20 0046 11/19/20 0349 11/20/20 0135  WBC 9.4  --  7.7 10.2 7.2 7.2  NEUTROABS 7.0  --   --   --  3.6 3.6  HGB 13.7 14.6 13.1 12.7* 12.2* 11.7*  HCT 42.1 43.0 38.4* 37.5* 35.6* 34.8*  MCV 98.4  --  95.3 94.7 95.4 95.3  PLT 190  --  167 185 207 [id number]    Basic Metabolic Panel: Recent Labs  Lab 11/13/20 1927 11/13/20 1933 11/15/20 0101 11/17/20 0046 11/19/20 0349 11/20/20 0135  NA 133* 136 137 138 139 139  K 4.7 4.7 4.0 3.6 3.9 4.0  CL 101 102 106 106 109 109  CO2 24  --  '26 24 24 25  '$ GLUCOSE 237* 226* 221* 144* 140* 130*  BUN '16 17 15 14 19 17  '$ CREATININE 1.30* 1.20 1.20 1.04 0.95 1.13  CALCIUM 8.9  --  8.7* 8.6* 8.6* 8.5*  MG  --   --   --   --  1.6* 1.8  PHOS  --   --   --   --  3.0 3.3    GFR: Estimated Creatinine Clearance: 44.9 mL/min (by C-G formula based on SCr of 1.13 mg/dL). Liver Function Tests: Recent Labs  Lab 11/13/20 1927 11/19/20 0349 11/20/20 0135  AST '21 16 16  '$ ALT '20 15 17  '$ ALKPHOS 59 43 45  BILITOT 0.6 0.7 0.4  PROT 6.5 5.8* 5.6*  ALBUMIN 3.2* 2.4* 2.4*    No results for input(s): LIPASE, AMYLASE in the last 168 hours. No results for input(s): AMMONIA in the last 168 hours. Coagulation Profile: Recent Labs  Lab 11/13/20 1927  INR 1.3*     Cardiac Enzymes: No results for input(s): CKTOTAL, CKMB, CKMBINDEX, TROPONINI in the last 168 hours. BNP (last 3 results) No results for input(s): PROBNP in the last 8760 hours. HbA1C: No results for input(s): HGBA1C in the last 72 hours. CBG: Recent Labs  Lab 11/19/20 1610 11/19/20 1822 11/19/20 2032 11/20/20 0749 11/20/20 1221  GLUCAP 251* 227* 201* 111* 181*    Lipid Profile: No results for input(s): CHOL, HDL, LDLCALC, TRIG, CHOLHDL, LDLDIRECT in the last 72 hours. Thyroid Function Tests: No results for input(s): TSH, T4TOTAL, FREET4, T3FREE, THYROIDAB in the last 72 hours. Anemia Panel: Recent Labs    11/20/20 1236  FERRITIN 89   Sepsis Labs: No results for input(s): PROCALCITON, LATICACIDVEN in the last 168 hours.  Recent Results (from the past 240 hour(s))  Resp Panel by RT-PCR (Flu A&B, Covid) Nasopharyngeal Swab     Status: None   Collection Time: 11/13/20  7:39 PM   Specimen: Nasopharyngeal Swab; Nasopharyngeal(NP) swabs in vial transport medium  Result Value Ref Range Status   SARS Coronavirus 2 by RT PCR NEGATIVE NEGATIVE Final    Comment: (NOTE) SARS-CoV-2 target nucleic acids are NOT DETECTED.  The SARS-CoV-2 RNA is generally detectable in upper respiratory specimens during the acute phase of infection. The lowest concentration of SARS-CoV-2 viral copies this assay can detect is 138 copies/mL. A negative result does not preclude SARS-Cov-2 infection and should not be used as the sole basis for treatment or other patient management decisions. A negative result may occur with  improper specimen collection/handling, submission of specimen other than nasopharyngeal swab, presence of viral mutation(s) within the areas targeted by this assay, and  inadequate number of viral copies(<138 copies/mL). A negative result must be combined with clinical observations, patient history, and epidemiological information. The expected result is Negative.  Fact Sheet  for Patients:  EntrepreneurPulse.com.au  Fact Sheet for Healthcare Providers:  IncredibleEmployment.be  This test is no t yet approved or cleared by the [place] FDA and  has been authorized for detection and/or diagnosis of SARS-CoV-2 by FDA under an Emergency Use Authorization (EUA). This EUA will remain  in effect (meaning this test can be used) for the duration of the COVID-19 declaration under Section 564(b)(1) of the Act, 21 U.S.C.section 360bbb-3(b)(1), unless the authorization is terminated  or revoked sooner.       Influenza A by PCR NEGATIVE NEGATIVE Final   Influenza B by PCR NEGATIVE NEGATIVE Final    Comment: (NOTE) The Xpert Xpress SARS-CoV-2/FLU/RSV plus assay is intended as an aid in the diagnosis of influenza from Nasopharyngeal swab specimens and should not be used as a sole basis for treatment. Nasal washings and aspirates are unacceptable for Xpert Xpress SARS-CoV-2/FLU/RSV testing.  Fact Sheet for Patients: EntrepreneurPulse.com.au  Fact Sheet for Healthcare Providers: IncredibleEmployment.be  This test is not yet approved or cleared by the [place] FDA and has been authorized for detection and/or diagnosis of SARS-CoV-2 by FDA under an Emergency Use Authorization (EUA). This EUA will remain in effect (meaning this test can be used) for the duration of the COVID-19 declaration under Section 564(b)(1) of the Act, 21 U.S.C. section 360bbb-3(b)(1), unless the authorization is terminated or revoked.  Performed at [facility], [place] [address], [place], [place] [address]   Resp Panel by RT-PCR (Flu A&B, Covid) Nasopharyngeal Swab     Status: Abnormal   Collection Time: 11/20/20  9:06 AM   Specimen: Nasopharyngeal Swab; Nasopharyngeal(NP) swabs in vial transport medium  Result Value Ref Range Status   SARS Coronavirus 2 by RT PCR POSITIVE (A) NEGATIVE Final    Comment:  RESULT CALLED TO, READ BACK BY AND VERIFIED WITH: R,CROGHAN '@1111'$  11/20/20 EB (NOTE) SARS-CoV-2 target nucleic acids are DETECTED.  The SARS-CoV-2 RNA is generally detectable in upper respiratory specimens during the acute phase of infection. Positive results are indicative of the presence of the identified virus, but do not rule out bacterial infection or co-infection with other pathogens not detected by the test. Clinical correlation with patient history and other diagnostic information is necessary to determine patient infection status. The expected result is Negative.  Fact Sheet for Patients: EntrepreneurPulse.com.au  Fact Sheet for Healthcare Providers: IncredibleEmployment.be  This test is not yet approved or cleared by the [place] FDA and  has been authorized for detection and/or diagnosis of SARS-CoV-2 by FDA under an Emergency Use Authorization (EUA).  This EUA will remain in effect (meaning this test can be used) f or the duration of  the COVID-19 declaration under Section 564(b)(1) of the Act, 21 U.S.C. section 360bbb-3(b)(1), unless the authorization is terminated or revoked sooner.     Influenza A by PCR NEGATIVE NEGATIVE Final   Influenza B by PCR NEGATIVE NEGATIVE Final    Comment: (NOTE) The Xpert Xpress SARS-CoV-2/FLU/RSV plus assay is intended as an aid in the diagnosis of influenza from Nasopharyngeal swab specimens and should not be used as a sole basis for treatment. Nasal washings and aspirates are unacceptable for Xpert Xpress SARS-CoV-2/FLU/RSV testing.  Fact Sheet for Patients: EntrepreneurPulse.com.au  Fact Sheet for Healthcare Providers: IncredibleEmployment.be  This test is not yet approved or cleared by the [place]  States FDA and has been authorized for detection and/or diagnosis of SARS-CoV-2 by FDA under an Emergency Use Authorization (EUA). This EUA will remain in  effect (meaning this test can be used) for the duration of the COVID-19 declaration under Section 564(b)(1) of the Act, 21 U.S.C. section 360bbb-3(b)(1), unless the authorization is terminated or revoked.  Performed at [facility], [place] [address] [place], [place] [address]      RN Pressure Injury Documentation:     Estimated body mass index is 29.23 kg/m as calculated from the following:   Height as of this encounter: '5\' 3"'$  (1.6 m).   Weight as of this encounter: 74.8 kg.  Malnutrition Type:   Malnutrition Characteristics:  Nutrition Interventions:     Radiology Studies: No results found.  Scheduled Meds:   stroke: mapping our early stages of recovery book   Does not apply Once   allopurinol  300 mg Oral Daily   vitamin C  500 mg Oral Daily   aspirin EC  81 mg Oral Daily   atorvastatin  80 mg Oral Daily   baclofen  10 mg Oral BID   clopidogrel  75 mg Oral Daily   divalproex  500 mg Oral QHS   doxepin  25 mg Oral QHS   enoxaparin (LOVENOX) injection  40 mg Subcutaneous Q24H   escitalopram  10 mg Oral Daily   ezetimibe  10 mg Oral Daily   insulin aspart  0-9 Units Subcutaneous TID WC   insulin aspart  3 Units Subcutaneous TID WC   insulin glargine-yfgn  24 Units Subcutaneous QHS   metoprolol succinate  12.5 mg Oral Daily   zinc sulfate  220 mg Oral Daily   Continuous Infusions:   LOS: 6 days   Kerney Elbe, DO [facility] PAGER is on AMION  If 7PM-7AM, please contact night-coverage [email or web address]

## 2020-11-20 NOTE — Progress Notes (Signed)
  Speech Language Pathology Treatment: Cognitive-Linquistic  Patient Details Name: Fred [name] MRN: [id number] DOB: June 07, 1935 Today's Date: 11/20/2020 Time: 1726-     Assessment / Plan / Recommendation Clinical Impression  Pt was seen for treatment. He was alert and cooperative during the session. He stated that he believes his thinking is improved and his processing speed faster. Pt was oriented to time on this date with cues for the correct date. He completed a concrete reasoning task with 80% accuracy increasing to 100% with cues. He required increased support for abstract reasoning. He demonstrated 60% accuracy with simple time management problems increasing to 80% with cues and repetition. Pt's focused attention was improved during this session, but support was still needed for sustained again. SLP will continue to follow pt.    HPI HPI: Pt is an 85 y/o male admitted 9/2 secondary to new onset R weakness. MRI brain: Two punctate foci of acute ischemia within the subcortical right  frontal lobe, 1 of which is along the right precentral gyrus. Advanced white matter disease most consistent with chronic small vessel ischemia. Pt passed [facility] screen on 8/2 and swallow eval ordered on 8/4. PMH: HTN, DM, seizures, a fib, CVA with L sided weakness, CHF, and CAD. MBS 08/27/19: min oropharyngeal dysphagia with oral holding of bolus and decreased organization, swallow trigger at the level of the valleculae with liquids, min reduced tongue base retraction which cleared with dry swallow. No penetration or aspiration observed. A regular texture diet with thin liquids recommended.      SLP Plan  Continue with current plan of care       Recommendations  Diet recommendations: Regular;Thin liquid Liquids provided via: Cup;Straw Medication Administration: Whole meds with liquid Supervision: Patient able to self feed Postural Changes and/or Swallow Maneuvers: Seated upright 90 degrees                 Oral Care Recommendations: Oral care BID Follow up Recommendations: Inpatient Rehab SLP Visit Diagnosis: Cognitive communication deficit [id number]) Plan: Continue with current plan of care       Fred [name] I. Hardin Negus, [place], [place] Office number [phone] Pager [phone]                Fred [name] 11/20/2020, 5:43 PM

## 2020-11-20 NOTE — TOC Progression Note (Addendum)
Transition of Care [facility]) - Progression Note    Patient Details  Name: Fred [name] MRN: [id number] Date of Birth: 14-Feb-1936  Transition of Care [facility]) CM/SW Contact  Joanne Chars, [place] Phone Number: 11/20/2020, 8:23 AM  Clinical Narrative:    SNF auth approved in [place] portal: Plan Auth ID: [id number], [place] ID: [id number], 5 days approved, 9/9-9/13.    1120: Pt tested positive for Covid.  Beverlee Nims at [facility] notified, will reconsider pt after quarantine.  RN to contact daughter.     Expected Discharge Plan: [place] Barriers to Discharge: Continued Medical Work up, SNF Pending bed offer  Expected Discharge Plan and Services Expected Discharge Plan: [place] Choice: [place] arrangements for the past 2 months: Single Family Home                                       Social Determinants of Health (SDOH) Interventions    Readmission Risk Interventions No flowsheet data found.

## 2020-11-20 NOTE — Progress Notes (Signed)
Patient tested positive for covid19. Patient being tested for SNF placement. Case management and attending MD notifed- see new orders. Family notified. Spoke with both daughters. [place] daughter in the room when test resulted. Family states his daughter and family tested positive for Covid19 previously.

## 2020-11-20 NOTE — Progress Notes (Signed)
Physical Therapy Treatment Patient Details Name: Fred [name] MRN: [id number] DOB: 06-13-1935 Today's Date: 11/20/2020    History of Present Illness Pt is an 85 y/o male admitted 9/2 secondary to new onset R weakness. Found to have R frontal lobe infarct. 9/3 rapid response contacted for confusion and disorientation. 9/9 COVID (+).  PMH includes HTN, DM, seizures, a fib, CVA with L sided weakness, CHF, and CAD.    PT Comments    Pt tolerated treatment well with VSS on RA. Pt having pain in LLE with movement that's different compared to baseline and initial evaluation. Continues to demonstrate weakness in RUE and trunk compared to initial session, with difficulty maintaining upright position and clearing buttock with use of stedy. Pt demonstrated a significant R lateral lean in standing position with use of stedy during session. Continue to recommend SNF due to pt's current functional status.    Follow Up Recommendations  SNF     Equipment Recommendations  None recommended by PT    Recommendations for Other Services       Precautions / Restrictions Precautions Precautions: Fall Precaution Comments: left hemiplegia Restrictions Weight Bearing Restrictions: No    Mobility  Bed Mobility Overal bed mobility: Needs Assistance Bed Mobility: Supine to Sit;Sit to Supine;Rolling Rolling: Min assist   Supine to sit: Mod assist;HOB elevated;+2 for safety/equipment Sit to supine: +2 for physical assistance;Mod assist   General bed mobility comments: HOB elevated to 30 degrees. Mod A for supine to sit to swing LEs off EOB and elevate trunk with assist from pad and +2 for safety. Sit to supine mod A +2 physical assist fror LEs and trunk. Rolling was min A with assist from pad and bedrails.    Transfers Overall transfer level: Needs assistance   Transfers: Sit to/from Stand Sit to Stand: From elevated surface;Mod assist;Max assist         General transfer comment: sit to stand  x3 from bed with 1st trial without stedy. Trial without stedy, mod +2 physical assist to boost to standing with knee block on L. 2nd standing trial with stedy mod +2 to boost to stand. 3rd trial with stedy max +2 to boost to stand with assist from pads. Pt demonstrating increased trunk flexion during transfers. Verbal cues for upright posture  Ambulation/Gait             General Gait Details: Non-ambulatory at baseline   Stairs             Wheelchair Mobility    Modified Rankin (Stroke Patients Only)       Balance Overall balance assessment: Needs assistance Sitting-balance support: Single extremity supported;Feet unsupported Sitting balance-Leahy Scale: Fair Sitting balance - Comments: Used RUE to steady     Standing balance-Leahy Scale: Poor Standing balance comment: Initially without stedy, required external assist to maintain balance. Difficulty maintaining upright posture with use of stedy, uni support from RUE, and external assist. Demonstrated R lateral lean with use of stedy.                            Cognition Arousal/Alertness: Awake/alert Behavior During Therapy: WFL for tasks assessed/performed Overall Cognitive Status: Within Functional Limits for tasks assessed                                 General Comments: Oriented with granddaughter present during session  Exercises      General Comments General comments (skin integrity, edema, etc.): Pt's granndaughter present with VSS on RA      Pertinent Vitals/Pain Faces Pain Scale: Hurts little more Pain Location: L LE Pain Descriptors / Indicators: Grimacing;Guarding Pain Intervention(s): Monitored during session    Home Living                      Prior Function            PT Goals (current goals can now be found in the care plan section) Progress towards PT goals: Progressing toward goals    Frequency    Min 3X/week      PT Plan Current plan  remains appropriate    Co-evaluation              AM-PAC PT "6 Clicks" Mobility   Outcome Measure  Help needed turning from your back to your side while in a flat bed without using bedrails?: A Little Help needed moving from lying on your back to sitting on the side of a flat bed without using bedrails?: A Lot Help needed moving to and from a bed to a chair (including a wheelchair)?: Total Help needed standing up from a chair using your arms (e.g., wheelchair or bedside chair)?: Total Help needed to walk in hospital room?: Total Help needed climbing 3-5 steps with a railing? : Total 6 Click Score: 9    End of Session Equipment Utilized During Treatment: Other (comment) (stedy) Activity Tolerance: Patient tolerated treatment well Patient left: with call bell/phone within reach;in bed;with family/visitor present;with bed alarm set Nurse Communication: Mobility status PT Visit Diagnosis: Unsteadiness on feet (R26.81);Muscle weakness (generalized) (M62.81);Difficulty in walking, not elsewhere classified (R26.2)     Time: [id number] PT Time Calculation (min) (ACUTE ONLY): 25 min  Charges:  $Therapeutic Activity: 23-37 mins                     Louie Casa, SPT Acute Rehab: [phone] [id number]    Domingo Dimes 11/20/2020, 11:52 AM

## 2020-11-21 ENCOUNTER — Inpatient Hospital Stay (HOSPITAL_COMMUNITY): Payer: Medicare Other

## 2020-11-21 DIAGNOSIS — I639 Cerebral infarction, unspecified: Secondary | ICD-10-CM | POA: Diagnosis not present

## 2020-11-21 DIAGNOSIS — E1169 Type 2 diabetes mellitus with other specified complication: Secondary | ICD-10-CM | POA: Diagnosis not present

## 2020-11-21 DIAGNOSIS — N179 Acute kidney failure, unspecified: Secondary | ICD-10-CM | POA: Diagnosis not present

## 2020-11-21 DIAGNOSIS — I2510 Atherosclerotic heart disease of native coronary artery without angina pectoris: Secondary | ICD-10-CM | POA: Diagnosis not present

## 2020-11-21 LAB — COMPREHENSIVE METABOLIC PANEL
ALT: 33 U/L (ref 0–44)
AST: 50 U/L — ABNORMAL HIGH (ref 15–41)
Albumin: 2.7 g/dL — ABNORMAL LOW (ref 3.5–5.0)
Alkaline Phosphatase: 50 U/L (ref 38–126)
Anion gap: 10 (ref 5–15)
BUN: 14 mg/dL (ref 8–23)
CO2: 25 mmol/L (ref 22–32)
Calcium: 8.8 mg/dL — ABNORMAL LOW (ref 8.9–10.3)
Chloride: 102 mmol/L (ref 98–111)
Creatinine, Ser: 0.87 mg/dL (ref 0.61–1.24)
GFR, Estimated: >60 mL/min (ref >60)
Glucose, Bld: 145 mg/dL — ABNORMAL HIGH (ref 70–99)
Potassium: 4.1 mmol/L (ref 3.5–5.1)
Sodium: 137 mmol/L (ref 135–145)
Total Bilirubin: 0.6 mg/dL (ref 0.3–1.2)
Total Protein: 6.3 g/dL — ABNORMAL LOW (ref 6.5–8.1)

## 2020-11-21 LAB — CBC WITH DIFFERENTIAL/PLATELET
Abs Immature Granulocytes: 0.05 10*3/uL (ref 0.00–0.07)
Basophils Absolute: 0 10*3/uL (ref 0.0–0.1)
Basophils Relative: 1 %
Eosinophils Absolute: 0.2 10*3/uL (ref 0.0–0.5)
Eosinophils Relative: 4 %
HCT: 37.7 % — ABNORMAL LOW (ref 39.0–52.0)
Hemoglobin: 12.7 g/dL — ABNORMAL LOW (ref 13.0–17.0)
Immature Granulocytes: 1 %
Lymphocytes Relative: 12 %
Lymphs Abs: 0.6 10*3/uL — ABNORMAL LOW (ref 0.7–4.0)
MCH: 32.2 pg (ref 26.0–34.0)
MCHC: 33.7 g/dL (ref 30.0–36.0)
MCV: 95.7 fL (ref 80.0–100.0)
Monocytes Absolute: 0.9 10*3/uL (ref 0.1–1.0)
Monocytes Relative: 16 %
Neutro Abs: 3.6 10*3/uL (ref 1.7–7.7)
Neutrophils Relative %: 66 %
Platelets: 198 10*3/uL (ref 150–400)
RBC: 3.94 MIL/uL — ABNORMAL LOW (ref 4.22–5.81)
RDW: 14.1 % (ref 11.5–15.5)
WBC: 5.4 10*3/uL (ref 4.0–10.5)
nRBC: 0 % (ref 0.0–0.2)

## 2020-11-21 LAB — FIBRINOGEN: Fibrinogen: 549 mg/dL — ABNORMAL HIGH (ref 210–475)

## 2020-11-21 LAB — D-DIMER, QUANTITATIVE: D-Dimer, Quant: 0.59 ug/mL-FEU — ABNORMAL HIGH (ref 0.00–0.50)

## 2020-11-21 LAB — LACTATE DEHYDROGENASE: LDH: 182 U/L (ref 98–192)

## 2020-11-21 LAB — PHOSPHORUS: Phosphorus: 2.8 mg/dL (ref 2.5–4.6)

## 2020-11-21 LAB — SEDIMENTATION RATE: Sed Rate: 30 mm/hr — ABNORMAL HIGH (ref 0–16)

## 2020-11-21 LAB — C-REACTIVE PROTEIN: CRP: 4.3 mg/dL — ABNORMAL HIGH (ref <1.0)

## 2020-11-21 LAB — GLUCOSE, CAPILLARY
Glucose-Capillary: 87 mg/dL (ref 70–99)
Glucose-Capillary: 95 mg/dL (ref 70–99)
Glucose-Capillary: 77 mg/dL (ref 70–99)
Glucose-Capillary: 92 mg/dL (ref 70–99)

## 2020-11-21 LAB — MAGNESIUM: Magnesium: 1.7 mg/dL (ref 1.7–2.4)

## 2020-11-21 LAB — FERRITIN: Ferritin: 116 ng/mL (ref 24–336)

## 2020-11-21 NOTE — Progress Notes (Signed)
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  Symptoms apparently began 2 days prior to admission and patient was evaluated in the ED.  He underwent MRI which actually showed a right-sided small stroke.  He was hospitalized for further management and he was previously on aspirin Plavix and recently taken off Plavix and started on Eliquis by his cardiologist at the [facility] for diagnosis of atrial fibrillation.  Neurology evaluated for his acute CVA and MRI showed an acute infarct in the right cerebral hemisphere and the patient came in with right-sided weakness and was noted to have slurred speech.  Neurology did not feel that the CVA is related to his symptoms and his right-sided weakness improved.  Patient continues to have persistent left-sided weakness and hemiplegia.  He was evaluated by CIR for candidacy however is not found to be a candidate for now PT OT is recommending SNF and currently being worked up for [facility] SN[facility] and was going to be discharged to skilled nursing facility however he incidentally tested positive for COVID-19 and so will need 10-day isolation and will start him on remdesivir.  Currently is being treated for the following but not limited to   Assessment & Plan:   Principal Problem:   Acute CVA (cerebrovascular accident) [facility]) Active Problems:   Hypertension associated with diabetes ([place])   Coronary  atherosclerosis   Type 2 diabetes mellitus with complication, with long-term current use of insulin ([place])   AKI (acute kidney injury) ([place])   Hyperlipidemia associated with type 2 diabetes mellitus ([place])   Seizure disorder ([place])   Depression with anxiety  Right Leg Weakness; ? TIA -Worked up for CVA and had incidental findings as below -VTE [place] done and showed no DVT -In the setting of left-sided ICA stenosis possible syncope -CTA of the head and neck showed left ICA 60 to 70% stenosis -Neurology recommends avoiding dehydration and recommending blood pressure monitoring at home -Neurology also recommends outpatient follow-up with vascular surgery given his evidence of finding a CTA neck -Continue with aspirin and Plavix as he remains off of Eliquis -Right Leg Weakness improved   COVID-19 disease -Was not symptomatic but is having some diarrhea that could be attributed to COVID -Check inflammatory markers and trend daily -Patient has had both vaccines and booster  Recent Labs    11/20/20 1236 11/21/20 0201  DDIMER 0.60* 0.59*  FERRITIN 89 116  LDH 138 182  CRP 4.2* 4.3*   -Inflammatory Markers are stable Lab Results  Component Value Date   SARSCOV2NAA POSITIVE (A) 11/20/2020   [place] NEGATIVE 11/13/2020   [place] NEGATIVE 11/24/2018   -SpO2: 90 % on ROOM AIR -Currently not symptomatic but patient's daughter does state that he does cough with his food and has had some intermittent wheezing -Check chest x-ray and showed "Cardiomediastinal silhouette unchanged in size and contour. Surgical changes of median  sternotomy and CABG. Calcifications of the aortic arch. Similar appearance of low lung volumes with predominantly reticular opacities of the lungs. No  pneumothorax. No large pleural effusion. Similar appearance of hazy opacity at the left costophrenic angle." -Will add Flutter Valve an Incentive Spirometry  -Start Remdesivir Course -Airborne and contact  precaution -Zinc and vitamin C -We will add Combivent 1 puff every 6 as needed  Acute CVA likely incidental -Heard CT done and showed No acute intracranial abnormality -EEG showed  "This EEG was recorded in the drowsy and sleeping state, the lack of waking state limits the ability to assess for encephalopathy. There was no seizure or seizure predisposition recorded on this study. Please note that lack of epileptiform activity on EEG does not preclude the possibility of epilepsy." -MRI showed acute infarct and read it as "Two punctate foci of acute ischemia within the subcortical right frontal lobe, 1 of which is along the right precentral gyrus. No hemorrhage or mass effect. Advanced white matter disease most consistent with chronic small vessel ischemia." -MRA showed "Normal intracranial MRA." -Patient came in with right-sided weakness which resolved  -Patient has prior history of CVA with residual left-sided hemiplegia.  -Also was noted to have slurred speech.   -Echocardiogram as below.  It showed normal systolic function.  Grade 1 diastolic dysfunction.  Mild LVH was noted.  -Patient was initially continued on Eliquis and aspirin.  After discussions between patient and family and neurology and review of his previous studies patient has been transitioned to aspirin and Plavix. -Noted to be on atorvastatin as well.  LDL noted to be 52.  HbA1c 7.8.   -CT angiogram head and neck does 60 presents to notices left ICA and 60% vertebral artery stenosis.  Neurology recommends follow-up with vascular surgery in the outpatient setting.   -Patient also underwent EEG which did not show any epileptiform activity -Seen by PT and OT and SLP.  Inpatient rehabilitation was recommended but his insurance authorization has been denied technology we will pursue SNF and awaiting Insurance Authorization and bed availability and he was also to be discharged today however on repeat COVID testing he tested positive so we  will need to be isolated for 10 days hard to be discharging to SNF  History of paroxysmal atrial fibrillation -Recently started on Eliquis by his cardiologist at the [facility]. however apparently patient's 30-day monitor did not show any evidence for atrial fibrillation.  -Initial episode of atrial fibrillation was in the setting of CABG in the postoperative period.  -Neurology feels patient would be better served by being on dual antiplatelet treatment.   -So he has been changed over to aspirin and Plavix after discussions between neurology and the patient and patient's daughter.   -Metoprolol was currently on hold for permissive hypertension but resumed at a lower dose -Blood pressure is relatively stable 135/53     Left lower extremity edema -Dopplers negative for DVT.  Most likely dependent edema.   Elevated creatinine -Creatinine was noted to be 1.3 on admission.   -Subsequently noted to be normal on repeat and now BUN/Cr is 19/0.95 -> 17/1.13 -> 14/0.87.  Monitor urine output.  Losartan on hold.   -Avoid nephrotoxic medications, contrast dyes, hypotension renally dose medications  Essential Hypertension -Permissive hypertension was allowed.  Losartan and metoprolol were placed on hold.  Blood pressure remains low normal.  -Metoprolol resumed primarily for A. fib at a lower dose.  Continue to hold losartan.   -Continue to monitor blood pressures per protocol last blood pressure reading was 135/53  Diabetes mellitus type 2, insulin-dependent -Noted to be on Toujeo at home.  Currently  on glargine insulin.  -HbA1c is 7.8.   -Dose of glargine was adjusted slightly the day beforeyesterday.  -CBG noted to be 91 yesterday morning.   -We will see how the trends are during the rest of the day and may need to cut back if it remains on the lower side.   -Home medication list shows that he is also supposed to be on -Jardiance and Tradjenta prior to admission.  These are currently on hold.   -We will add  NovoLog 3 units 3 times daily with meals if he eats greater than 50% of his meals -CBG's ranging from 87-163  Hypomagnesemia -Patient's Mag Level was 1.7 -Replete with IV Mag Sulfate 2 Grams again  -Continue to Monitor and Replete as Necessary -Repeat Mag Level in the the AM   Diarrhea -In the setting of Stool Softeners initially but they have been stopped and now in the setting of likely COVID-19 disease -Afebrile and has no Leukocytosis -Continue to Monitor and Trend   Normocytic Anemia -Patient's Hgb/Hct is relatively stable but slowly dropping as it has gone from 12.7/37.5 -> 12.2/35.6 -> 11.7/34.4 -> 12.7/37.7 -Check Anemia Panel in the AM -Continue to Monitor for S/Sx of Bleeding; No overt bleeding noted -Repeat CBC in the AM   History of Coronary Artery Disease status post CABG -Stable. Continue aspirin, statin, Zetia and low dose Metoprolol   History of Seizure Disorder -Continue Divalproex 500 mg po qHS.  Hyperlipidemia -Continue Atorvastatin 80 mg po Daily and Zetia.  History of depression and anxiety -Continue Esctialopram 10 mg po Daily and Doxepin 25 mg qHS  DVT prophylaxis: Enoxparin 40 mg sq q24h Code Status: FULL CODE  Family Communication: Discussed with Daughter over the telephone and granddaughter at bedside Disposition Plan: SNF   Status is: Inpatient  Remains inpatient appropriate because:Unsafe d/c plan, IV treatments appropriate due to intensity of illness or inability to take PO, and Inpatient level of care appropriate due to severity of illness  Dispo:  Patient From: Home  Planned Disposition: [place]  Medically stable for discharge: Yes  Consultants:  Neurology  Procedures:  ECHOCARDIOGRAM IMPRESSIONS     1. Left ventricular ejection fraction, by estimation, is 60 to 65%. The  left ventricle has normal function. The left ventricle has no regional  wall motion abnormalities. There is mild left ventricular hypertrophy.   Left ventricular diastolic parameters  are consistent with Grade I diastolic dysfunction (impaired relaxation).   2. Right ventricular systolic function is normal. The right ventricular  size is normal. There is normal pulmonary artery systolic pressure.   3. Left atrial size was moderately dilated.   4. The mitral valve is normal in structure. No evidence of mitral valve  regurgitation. No evidence of mitral stenosis.   5. The aortic valve is normal in structure. Aortic valve regurgitation is  trivial. Mild to moderate aortic valve sclerosis/calcification is present,  without any evidence of aortic stenosis.   6. The inferior vena cava is normal in size with greater than 50%  respiratory variability, suggesting right atrial pressure of 3 mmHg.   Conclusion(s)/Recommendation(s): No intracardiac source of embolism  detected on this transthoracic study. A transesophageal echocardiogram is  recommended to exclude cardiac source of embolism if clinically indicated.   FINDINGS   Left Ventricle: Left ventricular ejection fraction, by estimation, is 60  to 65%. The left ventricle has normal function. The left ventricle has no  regional wall motion abnormalities. The left ventricular internal cavity  size  was normal in size. There is   mild left ventricular hypertrophy. Left ventricular diastolic parameters  are consistent with Grade I diastolic dysfunction (impaired relaxation).   Right Ventricle: The right ventricular size is normal. No increase in  right ventricular wall thickness. Right ventricular systolic function is  normal. There is normal pulmonary artery systolic pressure. The tricuspid  regurgitant velocity is 2.55 m/s, and   with an assumed right atrial pressure of 3 mmHg, the estimated right  ventricular systolic pressure is 0000000 mmHg.   Left Atrium: Left atrial size was moderately dilated.   Right Atrium: Right atrial size was normal in size.   Pericardium: There is no  evidence of pericardial effusion.   Mitral Valve: The mitral valve is normal in structure. No evidence of  mitral valve regurgitation. No evidence of mitral valve stenosis.   Tricuspid Valve: The tricuspid valve is normal in structure. Tricuspid  valve regurgitation is not demonstrated. No evidence of tricuspid  stenosis.   Aortic Valve: The aortic valve is normal in structure. Aortic valve  regurgitation is trivial. Mild to moderate aortic valve  sclerosis/calcification is present, without any evidence of aortic  stenosis. Aortic valve mean gradient measures 6.0 mmHg. Aortic  valve peak gradient measures 11.4 mmHg. Aortic valve area, by VTI measures  1.58 cm.   Pulmonic Valve: The pulmonic valve was normal in structure. Pulmonic valve  regurgitation is not visualized. No evidence of pulmonic stenosis.   Aorta: The aortic root is normal in size and structure.   Venous: The inferior vena cava is normal in size with greater than 50%  respiratory variability, suggesting right atrial pressure of 3 mmHg.   IAS/Shunts: No atrial level shunt detected by color flow Doppler.      LEFT VENTRICLE  PLAX 2D  LVIDd:         3.80 cm     Diastology  LVIDs:         2.60 cm     LV e' medial:    6.20 cm/s  LV PW:         1.20 cm     LV E/e' medial:  15.8  LV IVS:        1.10 cm     LV e' lateral:   8.05 cm/s  LVOT diam:     2.00 cm     LV E/e' lateral: 12.2  LV SV:         62  LV SV Index:   35  LVOT Area:     3.14 cm     LV Volumes (MOD)  LV vol d, MOD A2C: 64.0 ml  LV vol d, MOD A4C: 77.4 ml  LV vol s, MOD A2C: 25.8 ml  LV vol s, MOD A4C: 30.0 ml  LV SV MOD A2C:     38.2 ml  LV SV MOD A4C:     77.4 ml  LV SV MOD BP:      44.4 ml   RIGHT VENTRICLE  RV Basal diam:  3.15 cm  RV Mid diam:    3.80 cm  RV S prime:     8.39 cm/s  TAPSE (M-mode): 1.7 cm   LEFT ATRIUM             Index       RIGHT ATRIUM           Index  LA diam:        4.90 cm 2.75 cm/m  RA Area:  11.90 cm  LA  Vol (A2C):   56.0 ml 31.43 ml/m RA Volume:   21.40 ml  12.01 ml/m  LA Vol (A4C):   57.5 ml 32.27 ml/m  LA Biplane Vol: 58.6 ml 32.89 ml/m   AORTIC VALVE  AV Area (Vmax):    1.59 cm  AV Area (Vmean):   1.56 cm  AV Area (VTI):     1.58 cm  AV Vmax:           169.00 cm/s  AV Vmean:          115.000 cm/s  AV VTI:            0.392 m  AV Peak Grad:      11.4 mmHg  AV Mean Grad:      6.0 mmHg  LVOT Vmax:         85.50 cm/s  LVOT Vmean:        57.100 cm/s  LVOT VTI:          0.197 m  LVOT/AV VTI ratio: 0.50   MITRAL VALVE                TRICUSPID VALVE  MV Area (PHT): 3.37 cm     TR Peak grad:   26.0 mmHg  MV Decel Time: 225 msec     TR Vmax:        255.00 cm/s  MV E velocity: 98.10 cm/s  MV A velocity: 118.00 cm/s  SHUNTS  MV E/A ratio:  0.83         Systemic VTI:  0.20 m                              Systemic Diam: 2.00 cm   Antimicrobials:  Anti-infectives (From admission, onward)    Start     Dose/Rate Route Frequency Ordered Stop   11/21/20 1000  remdesivir 100 mg in sodium chloride 0.9 % 100 mL IVPB       See Hyperspace for full Linked Orders Report.   100 mg 200 mL/hr over 30 Minutes Intravenous Daily 11/20/20 1520 11/25/20 0959   11/20/20 1615  remdesivir 200 mg in sodium chloride 0.9% 250 mL IVPB       See Hyperspace for full Linked Orders Report.   200 mg 580 mL/hr over 30 Minutes Intravenous Once 11/20/20 1520 11/20/20 1834        Subjective: Seen and examined at bedside and he is doing okay today.  No chest pain or shortness of breath.  Had some coarse breath sounds.  States that he is coughing a little bit.  No other concerns or complaints at this time.  Objective: Vitals:   11/20/20 0920 11/20/20 2100 11/21/20 0800 11/21/20 1207  BP: (!) 150/56 (!) 117/56 (!) 142/55 (!) 135/53  Pulse: 73 84 80 72  Resp: '16 19 16 18  '$ Temp: 98.4 F (36.9 C) 99.3 F (37.4 C) 98.5 F (36.9 C) 98.4 F (36.9 C)  TempSrc: Oral Oral Oral Oral  SpO2: 96% 95% 97% 90%   Weight:      Height:        Intake/Output Summary (Last 24 hours) at 11/21/2020 1334 Last data filed at 11/20/2020 2000 Gross per 24 hour  Intake 120 ml  Output --  Net 120 ml    Filed Weights   11/13/20 1900 11/13/20 2043  Weight: 82.6 kg 74.8 kg   Examination: Physical Exam:  Constitutional:  WN/WD overweight Caucasian male currently in no acute distress appears calm Eyes: Lids and conjunctivae normal, sclerae anicteric  ENMT: External Ears, Nose appear normal. Grossly normal hearing.  Neck: Appears normal, supple, no cervical masses, normal ROM, no appreciable thyromegaly; no appreciable JVD Respiratory: Diminished to auscultation bilaterally with coarse breath sounds and some rhonchi noted as well as some slight dry crackles.,  No appreciable wheezing noted.  Has a normal respiratory effort and is not tachypneic and has no accessory muscle usage. Cardiovascular: RRR, no murmurs / rubs / gallops. S1 and S2 auscultated.  Has some mild lower extremity swelling Abdomen: Soft, non-tender, distended secondary body habitus.  Bowel sounds positive.  GU: Deferred. Musculoskeletal: No clubbing / cyanosis of digits/nails. No joint deformity upper and lower extremities but has a left arm contracture Skin: No rashes, lesions, ulcers. No induration; Warm and dry.  Neurologic: CN 2-12 grossly intact with no focal deficits. Romberg sign and cerebellar reflexes not assessed.  Psychiatric: Normal judgment and insight. Alert and oriented x 3. Normal mood and appropriate affect.   Data Reviewed: I have personally reviewed following labs and imaging studies  CBC: Recent Labs  Lab 11/15/20 0101 11/17/20 0046 11/19/20 0349 11/20/20 0135 11/21/20 0201  WBC 7.7 10.2 7.2 7.2 5.4  NEUTROABS  --   --  3.6 3.6 3.6  HGB 13.1 12.7* 12.2* 11.7* 12.7*  HCT 38.4* 37.5* 35.6* 34.8* 37.7*  MCV 95.3 94.7 95.4 95.3 95.7  PLT 167 185 207 222 [id number]    Basic Metabolic Panel: Recent Labs  Lab  11/15/20 0101 11/17/20 0046 11/19/20 0349 11/20/20 0135 11/21/20 0201  NA 137 138 139 139 137  K 4.0 3.6 3.9 4.0 4.1  CL 106 106 109 109 102  CO2 '26 24 24 25 25  '$ GLUCOSE 221* 144* 140* 130* 145*  BUN '15 14 19 17 14  '$ CREATININE 1.20 1.04 0.95 1.13 0.87  CALCIUM 8.7* 8.6* 8.6* 8.5* 8.8*  MG  --   --  1.6* 1.8 1.7  PHOS  --   --  3.0 3.3 2.8    GFR: Estimated Creatinine Clearance: 58.3 mL/min (by C-G formula based on SCr of 0.87 mg/dL). Liver Function Tests: Recent Labs  Lab 11/19/20 0349 11/20/20 0135 11/21/20 0201  AST 16 16 50*  ALT 15 17 33  ALKPHOS 43 45 50  BILITOT 0.7 0.4 0.6  PROT 5.8* 5.6* 6.3*  ALBUMIN 2.4* 2.4* 2.7*    No results for input(s): LIPASE, AMYLASE in the last 168 hours. No results for input(s): AMMONIA in the last 168 hours. Coagulation Profile: No results for input(s): INR, PROTIME in the last 168 hours.  Cardiac Enzymes: No results for input(s): CKTOTAL, CKMB, CKMBINDEX, TROPONINI in the last 168 hours. BNP (last 3 results) No results for input(s): PROBNP in the last 8760 hours. HbA1C: No results for input(s): HGBA1C in the last 72 hours. CBG: Recent Labs  Lab 11/20/20 1558 11/20/20 1800 11/20/20 2105 11/21/20 0807 11/21/20 1209  GLUCAP 163* 145* 154* 87 95    Lipid Profile: No results for input(s): CHOL, HDL, LDLCALC, TRIG, CHOLHDL, LDLDIRECT in the last 72 hours. Thyroid Function Tests: No results for input(s): TSH, T4TOTAL, FREET4, T3FREE, THYROIDAB in the last 72 hours. Anemia Panel: Recent Labs    11/20/20 1236 11/21/20 0201  FERRITIN 89 116    Sepsis Labs: No results for input(s): PROCALCITON, LATICACIDVEN in the last 168 hours.  Recent Results (from the past 240 hour(s))  Resp Panel by RT-PCR (Flu A&B, Covid)  Nasopharyngeal Swab     Status: None   Collection Time: 11/13/20  7:39 PM   Specimen: Nasopharyngeal Swab; Nasopharyngeal(NP) swabs in vial transport medium  Result Value Ref Range Status   SARS Coronavirus  2 by RT PCR NEGATIVE NEGATIVE Final    Comment: (NOTE) SARS-CoV-2 target nucleic acids are NOT DETECTED.  The SARS-CoV-2 RNA is generally detectable in upper respiratory specimens during the acute phase of infection. The lowest concentration of SARS-CoV-2 viral copies this assay can detect is 138 copies/mL. A negative result does not preclude SARS-Cov-2 infection and should not be used as the sole basis for treatment or other patient management decisions. A negative result may occur with  improper specimen collection/handling, submission of specimen other than nasopharyngeal swab, presence of viral mutation(s) within the areas targeted by this assay, and inadequate number of viral copies(<138 copies/mL). A negative result must be combined with clinical observations, patient history, and epidemiological information. The expected result is Negative.  Fact Sheet for Patients:  EntrepreneurPulse.com.au  Fact Sheet for Healthcare Providers:  IncredibleEmployment.be  This test is no t yet approved or cleared by the [place] FDA and  has been authorized for detection and/or diagnosis of SARS-CoV-2 by FDA under an Emergency Use Authorization (EUA). This EUA will remain  in effect (meaning this test can be used) for the duration of the COVID-19 declaration under Section 564(b)(1) of the Act, 21 U.S.C.section 360bbb-3(b)(1), unless the authorization is terminated  or revoked sooner.       Influenza A by PCR NEGATIVE NEGATIVE Final   Influenza B by PCR NEGATIVE NEGATIVE Final    Comment: (NOTE) The Xpert Xpress SARS-CoV-2/FLU/RSV plus assay is intended as an aid in the diagnosis of influenza from Nasopharyngeal swab specimens and should not be used as a sole basis for treatment. Nasal washings and aspirates are unacceptable for Xpert Xpress SARS-CoV-2/FLU/RSV testing.  Fact Sheet for Patients: EntrepreneurPulse.com.au  Fact  Sheet for Healthcare Providers: IncredibleEmployment.be  This test is not yet approved or cleared by the [place] FDA and has been authorized for detection and/or diagnosis of SARS-CoV-2 by FDA under an Emergency Use Authorization (EUA). This EUA will remain in effect (meaning this test can be used) for the duration of the COVID-19 declaration under Section 564(b)(1) of the Act, 21 U.S.C. section 360bbb-3(b)(1), unless the authorization is terminated or revoked.  Performed at [facility], [place] [address], [place], [place] [address]   Resp Panel by RT-PCR (Flu A&B, Covid) Nasopharyngeal Swab     Status: Abnormal   Collection Time: 11/20/20  9:06 AM   Specimen: Nasopharyngeal Swab; Nasopharyngeal(NP) swabs in vial transport medium  Result Value Ref Range Status   SARS Coronavirus 2 by RT PCR POSITIVE (A) NEGATIVE Final    Comment: RESULT CALLED TO, READ BACK BY AND VERIFIED WITH: R,CROGHAN '@1111'$  11/20/20 EB (NOTE) SARS-CoV-2 target nucleic acids are DETECTED.  The SARS-CoV-2 RNA is generally detectable in upper respiratory specimens during the acute phase of infection. Positive results are indicative of the presence of the identified virus, but do not rule out bacterial infection or co-infection with other pathogens not detected by the test. Clinical correlation with patient history and other diagnostic information is necessary to determine patient infection status. The expected result is Negative.  Fact Sheet for Patients: EntrepreneurPulse.com.au  Fact Sheet for Healthcare Providers: IncredibleEmployment.be  This test is not yet approved or cleared by the [place] FDA and  has been authorized for detection and/or diagnosis of SARS-CoV-2  by FDA under an Emergency Use Authorization (EUA).  This EUA will remain in effect (meaning this test can be used) f or the duration of  the COVID-19 declaration under Section  564(b)(1) of the Act, 21 U.S.C. section 360bbb-3(b)(1), unless the authorization is terminated or revoked sooner.     Influenza A by PCR NEGATIVE NEGATIVE Final   Influenza B by PCR NEGATIVE NEGATIVE Final    Comment: (NOTE) The Xpert Xpress SARS-CoV-2/FLU/RSV plus assay is intended as an aid in the diagnosis of influenza from Nasopharyngeal swab specimens and should not be used as a sole basis for treatment. Nasal washings and aspirates are unacceptable for Xpert Xpress SARS-CoV-2/FLU/RSV testing.  Fact Sheet for Patients: EntrepreneurPulse.com.au  Fact Sheet for Healthcare Providers: IncredibleEmployment.be  This test is not yet approved or cleared by the [place] FDA and has been authorized for detection and/or diagnosis of SARS-CoV-2 by FDA under an Emergency Use Authorization (EUA). This EUA will remain in effect (meaning this test can be used) for the duration of the COVID-19 declaration under Section 564(b)(1) of the Act, 21 U.S.C. section 360bbb-3(b)(1), unless the authorization is terminated or revoked.  Performed at [facility], [place] [address], [place], [place] [id number]    RN Pressure Injury Documentation:     Estimated body mass index is 29.23 kg/m as calculated from the following:   Height as of this encounter: '5\' 3"'$  (1.6 m).   Weight as of this encounter: 74.8 kg.  Malnutrition Type:   Malnutrition Characteristics:  Nutrition Interventions:     Radiology Studies: DG CHEST PORT 1 VIEW  Result Date: 11/21/2020 CLINICAL DATA:  85 year old male with previous stroke EXAM: PORTABLE CHEST 1 VIEW COMPARISON:  11/20/2020 FINDINGS: Cardiomediastinal silhouette unchanged in size and contour. Surgical changes of median sternotomy and CABG. Calcifications of the aortic arch. Similar appearance of low lung volumes with predominantly reticular opacities of the lungs. No pneumothorax. No large pleural effusion. Similar  appearance of hazy opacity at the left costophrenic angle. IMPRESSION: Unchanged appearance of the chest x-ray with low lung volumes and predominantly interstitial disease. Surgical changes of median sternotomy and CABG. Electronically Signed   By: Corrie Mckusick D.O.   On: 11/21/2020 10:27   DG CHEST PORT 1 VIEW  Result Date: 11/20/2020 CLINICAL DATA:  Fall.  Positive COVID test. EXAM: PORTABLE CHEST 1 VIEW COMPARISON:  08/01/2019 FINDINGS: Previous median sternotomy and CABG procedure. No pleural effusion or edema. No airspace densities identified. Lung volumes are low. Subsegmental atelectasis noted within the left base. Curvature of the lumbar scratch set curvature of the thoracic spine appears convex towards the left. IMPRESSION: 1. Low lung volumes.  Left lung base atelectasis. Electronically Signed   By: Kerby Moors M.D.   On: 11/20/2020 15:35    Scheduled Meds:   stroke: mapping our early stages of recovery book   Does not apply Once   allopurinol  300 mg Oral Daily   vitamin C  500 mg Oral Daily   aspirin EC  81 mg Oral Daily   atorvastatin  80 mg Oral Daily   baclofen  10 mg Oral BID   clopidogrel  75 mg Oral Daily   divalproex  500 mg Oral QHS   doxepin  25 mg Oral QHS   enoxaparin (LOVENOX) injection  40 mg Subcutaneous Q24H   escitalopram  10 mg Oral Daily   ezetimibe  10 mg Oral Daily   insulin aspart  0-9 Units Subcutaneous TID WC  insulin aspart  3 Units Subcutaneous TID WC   insulin glargine-yfgn  24 Units Subcutaneous QHS   metoprolol succinate  12.5 mg Oral Daily   zinc sulfate  220 mg Oral Daily   Continuous Infusions:  remdesivir 100 mg in NS 100 mL 100 mg (11/21/20 0934)    LOS: 7 days   Kerney Elbe, DO [facility] PAGER is on AMION  If 7PM-7AM, please contact night-coverage [email or web address]

## 2020-11-22 ENCOUNTER — Inpatient Hospital Stay (HOSPITAL_COMMUNITY): Payer: Medicare Other

## 2020-11-22 DIAGNOSIS — I2510 Atherosclerotic heart disease of native coronary artery without angina pectoris: Secondary | ICD-10-CM | POA: Diagnosis not present

## 2020-11-22 DIAGNOSIS — R509 Fever, unspecified: Secondary | ICD-10-CM

## 2020-11-22 DIAGNOSIS — D72829 Elevated white blood cell count, unspecified: Secondary | ICD-10-CM

## 2020-11-22 DIAGNOSIS — N179 Acute kidney failure, unspecified: Secondary | ICD-10-CM | POA: Diagnosis not present

## 2020-11-22 DIAGNOSIS — I639 Cerebral infarction, unspecified: Secondary | ICD-10-CM | POA: Diagnosis not present

## 2020-11-22 DIAGNOSIS — F418 Other specified anxiety disorders: Secondary | ICD-10-CM | POA: Diagnosis not present

## 2020-11-22 LAB — GLUCOSE, CAPILLARY
Glucose-Capillary: 73 mg/dL (ref 70–99)
Glucose-Capillary: 142 mg/dL — ABNORMAL HIGH (ref 70–99)
Glucose-Capillary: 205 mg/dL — ABNORMAL HIGH (ref 70–99)
Glucose-Capillary: 185 mg/dL — ABNORMAL HIGH (ref 70–99)

## 2020-11-22 LAB — COMPREHENSIVE METABOLIC PANEL
ALT: 50 U/L — ABNORMAL HIGH (ref 0–44)
AST: 70 U/L — ABNORMAL HIGH (ref 15–41)
Albumin: 2.9 g/dL — ABNORMAL LOW (ref 3.5–5.0)
Alkaline Phosphatase: 49 U/L (ref 38–126)
Anion gap: 11 (ref 5–15)
BUN: 19 mg/dL (ref 8–23)
CO2: 26 mmol/L (ref 22–32)
Calcium: 8.9 mg/dL (ref 8.9–10.3)
Chloride: 100 mmol/L (ref 98–111)
Creatinine, Ser: 1.07 mg/dL (ref 0.61–1.24)
GFR, Estimated: >60 mL/min (ref >60)
Glucose, Bld: 95 mg/dL (ref 70–99)
Potassium: 3.7 mmol/L (ref 3.5–5.1)
Sodium: 137 mmol/L (ref 135–145)
Total Bilirubin: 0.7 mg/dL (ref 0.3–1.2)
Total Protein: 6.8 g/dL (ref 6.5–8.1)

## 2020-11-22 LAB — CBC WITH DIFFERENTIAL/PLATELET
Abs Immature Granulocytes: 0.15 10*3/uL — ABNORMAL HIGH (ref 0.00–0.07)
Basophils Absolute: 0.1 10*3/uL (ref 0.0–0.1)
Basophils Relative: 1 %
Eosinophils Absolute: 0.1 10*3/uL (ref 0.0–0.5)
Eosinophils Relative: 1 %
HCT: 41.3 % (ref 39.0–52.0)
Hemoglobin: 14.1 g/dL (ref 13.0–17.0)
Immature Granulocytes: 1 %
Lymphocytes Relative: 17 %
Lymphs Abs: 1.8 10*3/uL (ref 0.7–4.0)
MCH: 32.9 pg (ref 26.0–34.0)
MCHC: 34.1 g/dL (ref 30.0–36.0)
MCV: 96.5 fL (ref 80.0–100.0)
Monocytes Absolute: 2 10*3/uL — ABNORMAL HIGH (ref 0.1–1.0)
Monocytes Relative: 19 %
Neutro Abs: 6.5 10*3/uL (ref 1.7–7.7)
Neutrophils Relative %: 61 %
Platelets: 241 10*3/uL (ref 150–400)
RBC: 4.28 MIL/uL (ref 4.22–5.81)
RDW: 14.6 % (ref 11.5–15.5)
WBC: 10.6 10*3/uL — ABNORMAL HIGH (ref 4.0–10.5)
nRBC: 0 % (ref 0.0–0.2)

## 2020-11-22 LAB — FIBRINOGEN: Fibrinogen: 575 mg/dL — ABNORMAL HIGH (ref 210–475)

## 2020-11-22 LAB — LACTIC ACID, PLASMA
Lactic Acid, Venous: 2.7 mmol/L (ref 0.5–1.9)
Lactic Acid, Venous: 2.8 mmol/L (ref 0.5–1.9)

## 2020-11-22 LAB — FERRITIN: Ferritin: 148 ng/mL (ref 24–336)

## 2020-11-22 LAB — PHOSPHORUS: Phosphorus: 4.1 mg/dL (ref 2.5–4.6)

## 2020-11-22 LAB — SEDIMENTATION RATE: Sed Rate: 25 mm/hr — ABNORMAL HIGH (ref 0–16)

## 2020-11-22 LAB — D-DIMER, QUANTITATIVE: D-Dimer, Quant: 0.71 ug/mL-FEU — ABNORMAL HIGH (ref 0.00–0.50)

## 2020-11-22 LAB — LACTATE DEHYDROGENASE: LDH: 163 U/L (ref 98–192)

## 2020-11-22 LAB — C-REACTIVE PROTEIN: CRP: 5.2 mg/dL — ABNORMAL HIGH (ref <1.0)

## 2020-11-22 LAB — MAGNESIUM: Magnesium: 1.7 mg/dL (ref 1.7–2.4)

## 2020-11-22 LAB — PROCALCITONIN: Procalcitonin: <0.1 ng/mL

## 2020-11-22 MED ORDER — SODIUM CHLORIDE 0.9 % IV BOLUS
500.0000 mL | Freq: Once | INTRAVENOUS | Status: AC
  Administered 2020-11-22: 500 mL via INTRAVENOUS

## 2020-11-22 NOTE — Progress Notes (Signed)
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  Symptoms apparently began 2 days prior to admission and patient was evaluated in the ED.  He underwent MRI which actually showed a right-sided small stroke.  He was hospitalized for further management and he was previously on aspirin Plavix and recently taken off Plavix and started on Eliquis by his cardiologist at the [facility] for diagnosis of atrial fibrillation.  Neurology evaluated for his acute CVA and MRI showed an acute infarct in the right cerebral hemisphere and the patient came in with right-sided weakness and was noted to have slurred speech.  Neurology did not feel that the CVA is related to his symptoms and his right-sided weakness improved.  Patient continues to have persistent left-sided weakness and hemiplegia.  He was evaluated by CIR for candidacy however is not found to be a candidate for now PT OT is recommending SNF and currently being worked up for [facility] SN[facility] and was going to be discharged to skilled nursing facility however he incidentally tested positive for COVID-19 and so will need 10-day isolation and will start him on remdesivir.  Overnight his WBC trended upwards and he spiked a temperature so we will panculture the patient and obtain blood cultures x2, urinalysis and urine culture as well as a procalcitonin level and lactic acid level. Currently is being treated for the  following but not limited to   Assessment & Plan:   Principal Problem:   Acute CVA (cerebrovascular accident) [facility]) Active Problems:   Hypertension associated with diabetes ([place])   Coronary atherosclerosis   Type 2 diabetes mellitus with complication, with long-term current use of insulin ([place])   AKI (acute kidney injury) ([place])   Hyperlipidemia associated with type 2 diabetes mellitus ([place])   Seizure disorder ([place])   Depression with anxiety  Right Leg Weakness; ? TIA -Worked up for CVA and had incidental findings as below -VTE [place] done and showed no DVT -In the setting of left-sided ICA stenosis possible syncope -CTA of the head and neck showed left ICA 60 to 70% stenosis -Neurology recommends avoiding dehydration and recommending blood pressure monitoring at home -Neurology also recommends outpatient follow-up with vascular surgery given his evidence of finding a CTA neck -Continue with aspirin and Plavix as he remains off of Eliquis -Right Leg Weakness improved   COVID-19 disease -Was not symptomatic but is having some diarrhea that could be attributed to COVID -Check inflammatory markers and trend daily -Patient has had both vaccines and booster  Recent Labs    11/20/20 1236 11/21/20 0201 11/22/20 0542  DDIMER 0.60* 0.59* 0.71*  FERRITIN 89 116 148  LDH 138 182 163  CRP 4.2* 4.3* 5.2*   -Inflammatory Markers are stable Lab Results  Component Value Date   SARSCOV2NAA POSITIVE (A) 11/20/2020   [place] NEGATIVE 11/13/2020   [place] NEGATIVE 11/24/2018   -SpO2: 98 % on ROOM AIR -Currently not symptomatic but patient's daughter does state that he does cough with his food and has had some  intermittent wheezing -Check chest x-ray and showed "Cardiomediastinal silhouette unchanged in size and contour. Surgical changes of median  sternotomy and CABG. Calcifications of the aortic arch. Similar appearance of low lung volumes with predominantly reticular opacities  of the lungs. No pneumothorax. No large pleural effusion. Similar appearance of hazy opacity at the left costophrenic angle." -Will add Flutter Valve an Incentive Spirometry  -Start Remdesivir Course -Airborne and contact precaution -Zinc and vitamin C -We will add Combivent 1 puff every 6 as needed  Fever and Leukocytosis -Patient Meets 2 SIRS Criteria but does not appear Septic and denies any burning of discomfort and Denies SOB -WBC went from 5.4 -> 10.6 -Was Febrile overnight with a Tmax of 101.2 -Will give a 500 mL bolus -Check Blood Cx 2, Urinalysis and Urine Cx, and CXR this AM showed "The heart size and mediastinal contours are stable. Mild interstitial opacities are identified in bilateral lungs unchanged. There is probable minimal left pleural effusion. The visualized skeletal structures are stable." -Check Procalcitonin Level, Lactic Acid Level -Obtain Cx's and if necessary will place on Empiric Abx   Acute CVA likely incidental -Heard CT done and showed No acute intracranial abnormality -EEG showed  "This EEG was recorded in the drowsy and sleeping state, the lack of waking state limits the ability to assess for encephalopathy. There was no seizure or seizure predisposition recorded on this study. Please note that lack of epileptiform activity on EEG does not preclude the possibility of epilepsy." -MRI showed acute infarct and read it as "Two punctate foci of acute ischemia within the subcortical right frontal lobe, 1 of which is along the right precentral gyrus. No hemorrhage or mass effect. Advanced white matter disease most consistent with chronic small vessel ischemia." -MRA showed "Normal intracranial MRA." -Patient came in with right-sided weakness which resolved  -Patient has prior history of CVA with residual left-sided hemiplegia.  -Also was noted to have slurred speech.   -Echocardiogram as below.  It showed normal systolic function.  Grade 1 diastolic dysfunction.  Mild  LVH was noted.  -Patient was initially continued on Eliquis and aspirin.  After discussions between patient and family and neurology and review of his previous studies patient has been transitioned to aspirin and Plavix. -Noted to be on atorvastatin as well.  LDL noted to be 52.  HbA1c 7.8.   -CT angiogram head and neck does 60 presents to notices left ICA and 60% vertebral artery stenosis.  Neurology recommends follow-up with vascular surgery in the outpatient setting.   -Patient also underwent EEG which did not show any epileptiform activity -Seen by PT and OT and SLP.  Inpatient rehabilitation was recommended but his insurance authorization has been denied technology we will pursue SNF and awaiting Insurance Authorization and bed availability and he was also to be discharged today however on repeat COVID testing he tested positive so we will need to be isolated for 10 days hard to be discharging to SNF  History of paroxysmal atrial fibrillation -Recently started on Eliquis by his cardiologist at the [facility]. however apparently patient's 30-day monitor did not show any evidence for atrial fibrillation.  -Initial episode of atrial fibrillation was in the setting of CABG in the postoperative period.  -Neurology feels patient would be better served by being on dual antiplatelet treatment.   -So he has been changed over to aspirin and Plavix after discussions between neurology and the patient and patient's daughter.   -Metoprolol was currently on hold for permissive hypertension but  resumed at a lower dose -Blood pressure is relatively stable 135/53     Left lower extremity edema -Dopplers negative for DVT.  Most likely dependent edema.   Elevated creatinine -Creatinine was noted to be 1.3 on admission.   -Subsequently noted to be normal on repeat and now BUN/Cr is 19/0.95 -> 17/1.13 -> 14/0.87 -> 19/1.07.  Monitor urine output.  Losartan on hold.   -Avoid nephrotoxic medications, contrast dyes,  hypotension renally dose medications  Essential Hypertension -Permissive hypertension was allowed.  Losartan and metoprolol were placed on hold.  Blood pressure remains low normal.  -Metoprolol resumed primarily for A. fib at a lower dose.  Continue to hold losartan.   -Continue to monitor blood pressures per protocol last blood pressure reading was 149/85  Diabetes mellitus type 2, insulin-dependent -Noted to be on Toujeo at home.  Currently on glargine insulin.  -HbA1c is 7.8.   -Dose of glargine was adjusted slightly the day beforeyesterday.  -CBG noted to be 91 yesterday morning.   -We will see how the trends are during the rest of the day and may need to cut back if it remains on the lower side.   -Home medication list shows that he is also supposed to be on -Jardiance and Tradjenta prior to admission.  These are currently on hold.   -We will add NovoLog 3 units 3 times daily with meals if he eats greater than 50% of his meals -CBG's ranging from 73-205  Hypomagnesemia -Patient's Mag Level was 1.7 again this AM -Replete with IV Mag Sulfate 2 Grams again  -Continue to Monitor and Replete as Necessary -Repeat Mag Level in the the AM   Diarrhea -In the setting of Stool Softeners initially but they have been stopped and now in the setting of likely COVID-19 disease -Now the patient is febrile and has a leukocytosis so if he continues to have diarrhea will order infectious diarrhea work-up -Continue to Monitor and Trend   Abnormal LFTs -Worsening slightly and AST went from 59 trended up to 70 -ALT went from 33 is now 50 -We will consider holding his statin if it continues to elevate -If hepatic function continues to worsen or not improve we will obtain a right upper quadrant ultrasound as well as an acute hepatitis panel -Repeat CMP in a.m.  Normocytic Anemia -Patient's Hgb/Hct is relatively stable but slowly dropping as it has gone from 12.7/37.5 -> 12.2/35.6 -> 11.7/34.4 ->  12.7/37.7 -Check Anemia Panel in the AM -Continue to Monitor for S/Sx of Bleeding; No overt bleeding noted -Repeat CBC in the AM   History of Coronary Artery Disease status post CABG -Stable. Continue aspirin, statin, Zetia and low dose Metoprolol   History of Seizure Disorder -Continue Divalproex 500 mg po qHS.  Hyperlipidemia -Continue Atorvastatin 80 mg po Daily and Zetia.  May hold statin if LFTs continue to worsen  History of depression and anxiety -Continue Esctialopram 10 mg po Daily and Doxepin 25 mg qHS  DVT prophylaxis: Enoxparin 40 mg sq q24h Code Status: FULL CODE  Family Communication: Discussed with family at bedside Disposition Plan: SNF   Status is: Inpatient  Remains inpatient appropriate because:Unsafe d/c plan, IV treatments appropriate due to intensity of illness or inability to take PO, and Inpatient level of care appropriate due to severity of illness  Dispo:  Patient From: Home  Planned Disposition: [place]  Medically stable for discharge: Yes  Consultants:  Neurology  Procedures:  ECHOCARDIOGRAM IMPRESSIONS  1. Left ventricular ejection fraction, by estimation, is 60 to 65%. The  left ventricle has normal function. The left ventricle has no regional  wall motion abnormalities. There is mild left ventricular hypertrophy.  Left ventricular diastolic parameters  are consistent with Grade I diastolic dysfunction (impaired relaxation).   2. Right ventricular systolic function is normal. The right ventricular  size is normal. There is normal pulmonary artery systolic pressure.   3. Left atrial size was moderately dilated.   4. The mitral valve is normal in structure. No evidence of mitral valve  regurgitation. No evidence of mitral stenosis.   5. The aortic valve is normal in structure. Aortic valve regurgitation is  trivial. Mild to moderate aortic valve sclerosis/calcification is present,  without any evidence of aortic  stenosis.   6. The inferior vena cava is normal in size with greater than 50%  respiratory variability, suggesting right atrial pressure of 3 mmHg.   Conclusion(s)/Recommendation(s): No intracardiac source of embolism  detected on this transthoracic study. A transesophageal echocardiogram is  recommended to exclude cardiac source of embolism if clinically indicated.   FINDINGS   Left Ventricle: Left ventricular ejection fraction, by estimation, is 60  to 65%. The left ventricle has normal function. The left ventricle has no  regional wall motion abnormalities. The left ventricular internal cavity  size was normal in size. There is   mild left ventricular hypertrophy. Left ventricular diastolic parameters  are consistent with Grade I diastolic dysfunction (impaired relaxation).   Right Ventricle: The right ventricular size is normal. No increase in  right ventricular wall thickness. Right ventricular systolic function is  normal. There is normal pulmonary artery systolic pressure. The tricuspid  regurgitant velocity is 2.55 m/s, and   with an assumed right atrial pressure of 3 mmHg, the estimated right  ventricular systolic pressure is 0000000 mmHg.   Left Atrium: Left atrial size was moderately dilated.   Right Atrium: Right atrial size was normal in size.   Pericardium: There is no evidence of pericardial effusion.   Mitral Valve: The mitral valve is normal in structure. No evidence of  mitral valve regurgitation. No evidence of mitral valve stenosis.   Tricuspid Valve: The tricuspid valve is normal in structure. Tricuspid  valve regurgitation is not demonstrated. No evidence of tricuspid  stenosis.   Aortic Valve: The aortic valve is normal in structure. Aortic valve  regurgitation is trivial. Mild to moderate aortic valve  sclerosis/calcification is present, without any evidence of aortic  stenosis. Aortic valve mean gradient measures 6.0 mmHg. Aortic  valve peak gradient  measures 11.4 mmHg. Aortic valve area, by VTI measures  1.58 cm.   Pulmonic Valve: The pulmonic valve was normal in structure. Pulmonic valve  regurgitation is not visualized. No evidence of pulmonic stenosis.   Aorta: The aortic root is normal in size and structure.   Venous: The inferior vena cava is normal in size with greater than 50%  respiratory variability, suggesting right atrial pressure of 3 mmHg.   IAS/Shunts: No atrial level shunt detected by color flow Doppler.      LEFT VENTRICLE  PLAX 2D  LVIDd:         3.80 cm     Diastology  LVIDs:         2.60 cm     LV e' medial:    6.20 cm/s  LV PW:         1.20 cm     LV E/e' medial:  15.8  LV IVS:        1.10 cm     LV e' lateral:   8.05 cm/s  LVOT diam:     2.00 cm     LV E/e' lateral: 12.2  LV SV:         62  LV SV Index:   35  LVOT Area:     3.14 cm     LV Volumes (MOD)  LV vol d, MOD A2C: 64.0 ml  LV vol d, MOD A4C: 77.4 ml  LV vol s, MOD A2C: 25.8 ml  LV vol s, MOD A4C: 30.0 ml  LV SV MOD A2C:     38.2 ml  LV SV MOD A4C:     77.4 ml  LV SV MOD BP:      44.4 ml   RIGHT VENTRICLE  RV Basal diam:  3.15 cm  RV Mid diam:    3.80 cm  RV S prime:     8.39 cm/s  TAPSE (M-mode): 1.7 cm   LEFT ATRIUM             Index       RIGHT ATRIUM           Index  LA diam:        4.90 cm 2.75 cm/m  RA Area:     11.90 cm  LA Vol (A2C):   56.0 ml 31.43 ml/m RA Volume:   21.40 ml  12.01 ml/m  LA Vol (A4C):   57.5 ml 32.27 ml/m  LA Biplane Vol: 58.6 ml 32.89 ml/m   AORTIC VALVE  AV Area (Vmax):    1.59 cm  AV Area (Vmean):   1.56 cm  AV Area (VTI):     1.58 cm  AV Vmax:           169.00 cm/s  AV Vmean:          115.000 cm/s  AV VTI:            0.392 m  AV Peak Grad:      11.4 mmHg  AV Mean Grad:      6.0 mmHg  LVOT Vmax:         85.50 cm/s  LVOT Vmean:        57.100 cm/s  LVOT VTI:          0.197 m  LVOT/AV VTI ratio: 0.50   MITRAL VALVE                TRICUSPID VALVE  MV Area (PHT): 3.37 cm     TR Peak grad:    26.0 mmHg  MV Decel Time: 225 msec     TR Vmax:        255.00 cm/s  MV E velocity: 98.10 cm/s  MV A velocity: 118.00 cm/s  SHUNTS  MV E/A ratio:  0.83         Systemic VTI:  0.20 m                              Systemic Diam: 2.00 cm   Antimicrobials:  Anti-infectives (From admission, onward)    Start     Dose/Rate Route Frequency Ordered Stop   11/21/20 1000  remdesivir 100 mg in sodium chloride 0.9 % 100 mL IVPB       See Hyperspace for full Linked Orders Report.   100 mg 200 mL/hr over 30 Minutes Intravenous Daily  11/20/20 1520 11/25/20 0959   11/20/20 1615  remdesivir 200 mg in sodium chloride 0.9% 250 mL IVPB       See Hyperspace for full Linked Orders Report.   200 mg 580 mL/hr over 30 Minutes Intravenous Once 11/20/20 1520 11/20/20 1834        Subjective: Seen and examined at bedside and family at bedside states that he is eating quite well.  Patient states that he slept well.  No nausea or vomiting.  Denies any lightheadedness or dizziness.  No chest pain or shortness of breath and denies any urinary discomfort or burning.  States that he had 1 bowel movement yesterday.  No other concerns or complaints at this time.  Objective: Vitals:   11/21/20 2120 11/22/20 0000 11/22/20 0530 11/22/20 0800  BP: 128/70  (!) 152/78 (!) 149/85  Pulse: 88  81 84  Resp: '18  20 15  '$ Temp: (!) 101.2 F (38.4 C) 99 F (37.2 C) 98.5 F (36.9 C) (!) 101 F (38.3 C)  TempSrc: Oral  Oral Oral  SpO2: 98%  96% 98%  Weight:      Height:        Intake/Output Summary (Last 24 hours) at 11/22/2020 1633 Last data filed at 11/22/2020 0304 Gross per 24 hour  Intake 100 ml  Output 650 ml  Net -550 ml    Filed Weights   11/13/20 1900 11/13/20 2043  Weight: 82.6 kg 74.8 kg   Examination: Physical Exam:  Constitutional: WN/WD overweight Caucasian male currently in no acute distress appears calm Eyes: Lids and conjunctivae normal, sclerae anicteric  ENMT: External Ears, Nose appear normal.  Grossly normal hearing.  Neck: Appears normal, supple, no cervical masses, normal ROM, no appreciable thyromegaly; no JVD Respiratory: Diminished to auscultation bilaterally with coarse breath sounds and some mild dry crackles, no wheezing, rales, rhonchi. Normal respiratory effort and patient is not tachypenic. No accessory muscle use.  He has a normal respiratory effort and is not tachypneic or using accessory muscles to breathe Cardiovascular: RRR, no murmurs / rubs / gallops. S1 and S2 auscultated. Trace LE edema  Abdomen: Soft, non-tender, non-distended. Bowel sounds positive.  GU: Deferred. Musculoskeletal: No clubbing / cyanosis of digits/nails. No joint deformity upper and lower extremities but has significant left-sided weakness and has a left arm contracture Skin: No rashes, lesions, ulcers on a limited skin evaluation. No induration; Warm and dry.  Neurologic: CN 2-12 grossly intact with no focal deficits. Romberg sign and cerebellar reflexes not assessed.  Psychiatric: Normal judgment and insight. Alert and oriented x 3. Normal mood and appropriate affect.    Data Reviewed: I have personally reviewed following labs and imaging studies  CBC: Recent Labs  Lab 11/17/20 0046 11/19/20 0349 11/20/20 0135 11/21/20 0201 11/22/20 0542  WBC 10.2 7.2 7.2 5.4 10.6*  NEUTROABS  --  3.6 3.6 3.6 6.5  HGB 12.7* 12.2* 11.7* 12.7* 14.1  HCT 37.5* 35.6* 34.8* 37.7* 41.3  MCV 94.7 95.4 95.3 95.7 96.5  PLT 185 207 222 198 [id number]    Basic Metabolic Panel: Recent Labs  Lab 11/17/20 0046 11/19/20 0349 11/20/20 0135 11/21/20 0201 11/22/20 0542  NA 138 139 139 137 137  K 3.6 3.9 4.0 4.1 3.7  CL 106 109 109 102 100  CO2 '24 24 25 25 26  '$ GLUCOSE 144* 140* 130* 145* 95  BUN '14 19 17 14 19  '$ CREATININE 1.04 0.95 1.13 0.87 1.07  CALCIUM 8.6* 8.6* 8.5* 8.8* 8.9  MG  --  1.6* 1.8 1.7 1.7  PHOS  --  3.0 3.3 2.8 4.1    GFR: Estimated Creatinine Clearance: 47.4 mL/min (by C-G formula based on  SCr of 1.07 mg/dL). Liver Function Tests: Recent Labs  Lab 11/19/20 0349 11/20/20 0135 11/21/20 0201 11/22/20 0542  AST 16 16 50* 70*  ALT 15 17 33 50*  ALKPHOS 43 45 50 49  BILITOT 0.7 0.4 0.6 0.7  PROT 5.8* 5.6* 6.3* 6.8  ALBUMIN 2.4* 2.4* 2.7* 2.9*    No results for input(s): LIPASE, AMYLASE in the last 168 hours. No results for input(s): AMMONIA in the last 168 hours. Coagulation Profile: No results for input(s): INR, PROTIME in the last 168 hours.  Cardiac Enzymes: No results for input(s): CKTOTAL, CKMB, CKMBINDEX, TROPONINI in the last 168 hours. BNP (last 3 results) No results for input(s): PROBNP in the last 8760 hours. HbA1C: No results for input(s): HGBA1C in the last 72 hours. CBG: Recent Labs  Lab 11/21/20 1209 11/21/20 1644 11/21/20 2144 11/22/20 0823 11/22/20 1246  GLUCAP 95 77 92 73 142*    Lipid Profile: No results for input(s): CHOL, HDL, LDLCALC, TRIG, CHOLHDL, LDLDIRECT in the last 72 hours. Thyroid Function Tests: No results for input(s): TSH, T4TOTAL, FREET4, T3FREE, THYROIDAB in the last 72 hours. Anemia Panel: Recent Labs    11/21/20 0201 11/22/20 0542  FERRITIN 116 148    Sepsis Labs: No results for input(s): PROCALCITON, LATICACIDVEN in the last 168 hours.  Recent Results (from the past 240 hour(s))  Resp Panel by RT-PCR (Flu A&B, Covid) Nasopharyngeal Swab     Status: None   Collection Time: 11/13/20  7:39 PM   Specimen: Nasopharyngeal Swab; Nasopharyngeal(NP) swabs in vial transport medium  Result Value Ref Range Status   SARS Coronavirus 2 by RT PCR NEGATIVE NEGATIVE Final    Comment: (NOTE) SARS-CoV-2 target nucleic acids are NOT DETECTED.  The SARS-CoV-2 RNA is generally detectable in upper respiratory specimens during the acute phase of infection. The lowest concentration of SARS-CoV-2 viral copies this assay can detect is 138 copies/mL. A negative result does not preclude SARS-Cov-2 infection and should not be used as  the sole basis for treatment or other patient management decisions. A negative result may occur with  improper specimen collection/handling, submission of specimen other than nasopharyngeal swab, presence of viral mutation(s) within the areas targeted by this assay, and inadequate number of viral copies(<138 copies/mL). A negative result must be combined with clinical observations, patient history, and epidemiological information. The expected result is Negative.  Fact Sheet for Patients:  EntrepreneurPulse.com.au  Fact Sheet for Healthcare Providers:  IncredibleEmployment.be  This test is no t yet approved or cleared by the [place] FDA and  has been authorized for detection and/or diagnosis of SARS-CoV-2 by FDA under an Emergency Use Authorization (EUA). This EUA will remain  in effect (meaning this test can be used) for the duration of the COVID-19 declaration under Section 564(b)(1) of the Act, 21 U.S.C.section 360bbb-3(b)(1), unless the authorization is terminated  or revoked sooner.       Influenza A by PCR NEGATIVE NEGATIVE Final   Influenza B by PCR NEGATIVE NEGATIVE Final    Comment: (NOTE) The Xpert Xpress SARS-CoV-2/FLU/RSV plus assay is intended as an aid in the diagnosis of influenza from Nasopharyngeal swab specimens and should not be used as a sole basis for treatment. Nasal washings and aspirates are unacceptable for Xpert Xpress SARS-CoV-2/FLU/RSV testing.  Fact Sheet for Patients: EntrepreneurPulse.com.au  Fact Sheet for Healthcare  Providers: IncredibleEmployment.be  This test is not yet approved or cleared by the [place] and has been authorized for detection and/or diagnosis of SARS-CoV-2 by FDA under an Emergency Use Authorization (EUA). This EUA will remain in effect (meaning this test can be used) for the duration of the COVID-19 declaration under Section 564(b)(1) of  the Act, 21 U.S.C. section 360bbb-3(b)(1), unless the authorization is terminated or revoked.  Performed at [facility], [place] [address] [place], [place] [address]   Resp Panel by RT-PCR (Flu A&B, Covid) Nasopharyngeal Swab     Status: Abnormal   Collection Time: 11/20/20  9:06 AM   Specimen: Nasopharyngeal Swab; Nasopharyngeal(NP) swabs in vial transport medium  Result Value Ref Range Status   SARS Coronavirus 2 by RT PCR POSITIVE (A) NEGATIVE Final    Comment: RESULT CALLED TO, READ BACK BY AND VERIFIED WITH: R,CROGHAN '@1111'$  11/20/20 EB (NOTE) SARS-CoV-2 target nucleic acids are DETECTED.  The SARS-CoV-2 RNA is generally detectable in upper respiratory specimens during the acute phase of infection. Positive results are indicative of the presence of the identified virus, but do not rule out bacterial infection or co-infection with other pathogens not detected by the test. Clinical correlation with patient history and other diagnostic information is necessary to determine patient infection status. The expected result is Negative.  Fact Sheet for Patients: EntrepreneurPulse.com.au  Fact Sheet for Healthcare Providers: IncredibleEmployment.be  This test is not yet approved or cleared by the [place] FDA and  has been authorized for detection and/or diagnosis of SARS-CoV-2 by FDA under an Emergency Use Authorization (EUA).  This EUA will remain in effect (meaning this test can be used) f or the duration of  the COVID-19 declaration under Section 564(b)(1) of the Act, 21 U.S.C. section 360bbb-3(b)(1), unless the authorization is terminated or revoked sooner.     Influenza A by PCR NEGATIVE NEGATIVE Final   Influenza B by PCR NEGATIVE NEGATIVE Final    Comment: (NOTE) The Xpert Xpress SARS-CoV-2/FLU/RSV plus assay is intended as an aid in the diagnosis of influenza from Nasopharyngeal swab specimens and should not be used as a sole  basis for treatment. Nasal washings and aspirates are unacceptable for Xpert Xpress SARS-CoV-2/FLU/RSV testing.  Fact Sheet for Patients: EntrepreneurPulse.com.au  Fact Sheet for Healthcare Providers: IncredibleEmployment.be  This test is not yet approved or cleared by the [place] FDA and has been authorized for detection and/or diagnosis of SARS-CoV-2 by FDA under an Emergency Use Authorization (EUA). This EUA will remain in effect (meaning this test can be used) for the duration of the COVID-19 declaration under Section 564(b)(1) of the Act, 21 U.S.C. section 360bbb-3(b)(1), unless the authorization is terminated or revoked.  Performed at [facility], [place] [address], [place], [place] [address]    RN Pressure Injury Documentation:     Estimated body mass index is 29.23 kg/m as calculated from the following:   Height as of this encounter: '5\' 3"'$  (1.6 m).   Weight as of this encounter: 74.8 kg.  Malnutrition Type:   Malnutrition Characteristics:  Nutrition Interventions:     Radiology Studies: DG CHEST PORT 1 VIEW  Result Date: 11/22/2020 CLINICAL DATA:  Shortness of breath EXAM: PORTABLE CHEST 1 VIEW COMPARISON:  November 21, 2020 FINDINGS: The heart size and mediastinal contours are stable. Mild interstitial opacities are identified in bilateral lungs unchanged. There is probable minimal left pleural effusion. The visualized skeletal structures are stable. IMPRESSION: Mild interstitial opacities are identified in  bilateral lungs unchanged. Electronically Signed   By: Abelardo Diesel M.D.   On: 11/22/2020 08:43   DG CHEST PORT 1 VIEW  Result Date: 11/21/2020 CLINICAL DATA:  85 year old male with previous stroke EXAM: PORTABLE CHEST 1 VIEW COMPARISON:  11/20/2020 FINDINGS: Cardiomediastinal silhouette unchanged in size and contour. Surgical changes of median sternotomy and CABG. Calcifications of the aortic arch. Similar  appearance of low lung volumes with predominantly reticular opacities of the lungs. No pneumothorax. No large pleural effusion. Similar appearance of hazy opacity at the left costophrenic angle. IMPRESSION: Unchanged appearance of the chest x-ray with low lung volumes and predominantly interstitial disease. Surgical changes of median sternotomy and CABG. Electronically Signed   By: Corrie Mckusick D.O.   On: 11/21/2020 10:27    Scheduled Meds:   stroke: mapping our early stages of recovery book   Does not apply Once   allopurinol  300 mg Oral Daily   vitamin C  500 mg Oral Daily   aspirin EC  81 mg Oral Daily   atorvastatin  80 mg Oral Daily   baclofen  10 mg Oral BID   clopidogrel  75 mg Oral Daily   divalproex  500 mg Oral QHS   doxepin  25 mg Oral QHS   enoxaparin (LOVENOX) injection  40 mg Subcutaneous Q24H   escitalopram  10 mg Oral Daily   ezetimibe  10 mg Oral Daily   insulin aspart  0-9 Units Subcutaneous TID WC   insulin aspart  3 Units Subcutaneous TID WC   insulin glargine-yfgn  24 Units Subcutaneous QHS   metoprolol succinate  12.5 mg Oral Daily   zinc sulfate  220 mg Oral Daily   Continuous Infusions:  remdesivir 100 mg in NS 100 mL 100 mg (11/22/20 1126)    LOS: 8 days   Kerney Elbe, DO [facility] PAGER is on AMION  If 7PM-7AM, please contact night-coverage [email or web address]

## 2020-11-23 ENCOUNTER — Ambulatory Visit: Admitting: Family Medicine

## 2020-11-23 ENCOUNTER — Inpatient Hospital Stay (HOSPITAL_COMMUNITY): Payer: Medicare Other

## 2020-11-23 DIAGNOSIS — I2510 Atherosclerotic heart disease of native coronary artery without angina pectoris: Secondary | ICD-10-CM | POA: Diagnosis not present

## 2020-11-23 DIAGNOSIS — I639 Cerebral infarction, unspecified: Secondary | ICD-10-CM | POA: Diagnosis not present

## 2020-11-23 DIAGNOSIS — F418 Other specified anxiety disorders: Secondary | ICD-10-CM | POA: Diagnosis not present

## 2020-11-23 DIAGNOSIS — N179 Acute kidney failure, unspecified: Secondary | ICD-10-CM | POA: Diagnosis not present

## 2020-11-23 DIAGNOSIS — R338 Other retention of urine: Secondary | ICD-10-CM

## 2020-11-23 LAB — CBC WITH DIFFERENTIAL/PLATELET
Abs Immature Granulocytes: 0.12 10*3/uL — ABNORMAL HIGH (ref 0.00–0.07)
Basophils Absolute: 0 10*3/uL (ref 0.0–0.1)
Basophils Relative: 0 %
Eosinophils Absolute: 0.2 10*3/uL (ref 0.0–0.5)
Eosinophils Relative: 2 %
HCT: 34.6 % — ABNORMAL LOW (ref 39.0–52.0)
Hemoglobin: 11.6 g/dL — ABNORMAL LOW (ref 13.0–17.0)
Immature Granulocytes: 1 %
Lymphocytes Relative: 25 %
Lymphs Abs: 2.6 10*3/uL (ref 0.7–4.0)
MCH: 32 pg (ref 26.0–34.0)
MCHC: 33.5 g/dL (ref 30.0–36.0)
MCV: 95.6 fL (ref 80.0–100.0)
Monocytes Absolute: 1.9 10*3/uL — ABNORMAL HIGH (ref 0.1–1.0)
Monocytes Relative: 18 %
Neutro Abs: 5.9 10*3/uL (ref 1.7–7.7)
Neutrophils Relative %: 54 %
Platelets: 208 10*3/uL (ref 150–400)
RBC: 3.62 MIL/uL — ABNORMAL LOW (ref 4.22–5.81)
RDW: 14.5 % (ref 11.5–15.5)
WBC: 10.7 10*3/uL — ABNORMAL HIGH (ref 4.0–10.5)
nRBC: 0 % (ref 0.0–0.2)

## 2020-11-23 LAB — MAGNESIUM: Magnesium: 1.8 mg/dL (ref 1.7–2.4)

## 2020-11-23 LAB — COMPREHENSIVE METABOLIC PANEL
ALT: 34 U/L (ref 0–44)
AST: 37 U/L (ref 15–41)
Albumin: 2.2 g/dL — ABNORMAL LOW (ref 3.5–5.0)
Alkaline Phosphatase: 45 U/L (ref 38–126)
Anion gap: 8 (ref 5–15)
BUN: 30 mg/dL — ABNORMAL HIGH (ref 8–23)
CO2: 25 mmol/L (ref 22–32)
Calcium: 8.1 mg/dL — ABNORMAL LOW (ref 8.9–10.3)
Chloride: 102 mmol/L (ref 98–111)
Creatinine, Ser: 1.49 mg/dL — ABNORMAL HIGH (ref 0.61–1.24)
GFR, Estimated: 46 mL/min — ABNORMAL LOW (ref >60)
Glucose, Bld: 176 mg/dL — ABNORMAL HIGH (ref 70–99)
Potassium: 3.6 mmol/L (ref 3.5–5.1)
Sodium: 135 mmol/L (ref 135–145)
Total Bilirubin: 0.6 mg/dL (ref 0.3–1.2)
Total Protein: 5.4 g/dL — ABNORMAL LOW (ref 6.5–8.1)

## 2020-11-23 LAB — FERRITIN: Ferritin: 109 ng/mL (ref 24–336)

## 2020-11-23 LAB — URINALYSIS, ROUTINE W REFLEX MICROSCOPIC
Bacteria, UA: NONE SEEN
Bilirubin Urine: NEGATIVE
Glucose, UA: NEGATIVE mg/dL
Ketones, ur: NEGATIVE mg/dL
Leukocytes,Ua: NEGATIVE
Nitrite: NEGATIVE
Protein, ur: NEGATIVE mg/dL
Specific Gravity, Urine: 1.018 (ref 1.005–1.030)
pH: 5 (ref 5.0–8.0)

## 2020-11-23 LAB — PROCALCITONIN: Procalcitonin: 0.14 ng/mL

## 2020-11-23 LAB — GLUCOSE, CAPILLARY
Glucose-Capillary: 135 mg/dL — ABNORMAL HIGH (ref 70–99)
Glucose-Capillary: 133 mg/dL — ABNORMAL HIGH (ref 70–99)
Glucose-Capillary: 138 mg/dL — ABNORMAL HIGH (ref 70–99)
Glucose-Capillary: 187 mg/dL — ABNORMAL HIGH (ref 70–99)

## 2020-11-23 LAB — PHOSPHORUS: Phosphorus: 3.9 mg/dL (ref 2.5–4.6)

## 2020-11-23 LAB — D-DIMER, QUANTITATIVE: D-Dimer, Quant: 0.4 ug/mL-FEU (ref 0.00–0.50)

## 2020-11-23 LAB — SEDIMENTATION RATE: Sed Rate: 19 mm/hr — ABNORMAL HIGH (ref 0–16)

## 2020-11-23 LAB — LACTATE DEHYDROGENASE: LDH: 133 U/L (ref 98–192)

## 2020-11-23 LAB — FIBRINOGEN: Fibrinogen: 460 mg/dL (ref 210–475)

## 2020-11-23 LAB — C-REACTIVE PROTEIN: CRP: 7.7 mg/dL — ABNORMAL HIGH (ref <1.0)

## 2020-11-23 MED ORDER — SODIUM CHLORIDE 0.9 % IV BOLUS
500.0000 mL | Freq: Once | INTRAVENOUS | Status: AC
  Administered 2020-11-23: 500 mL via INTRAVENOUS

## 2020-11-23 MED ORDER — SODIUM CHLORIDE 0.9 % IV SOLN: INTRAVENOUS | Status: DC

## 2020-11-23 NOTE — Progress Notes (Signed)
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  Symptoms apparently began 2 days prior to admission and patient was evaluated in the ED.  He underwent MRI which actually showed a right-sided small stroke.  He was hospitalized for further management and he was previously on aspirin Plavix and recently taken off Plavix and started on Eliquis by his cardiologist at the [facility] for diagnosis of atrial fibrillation.  Neurology evaluated for his acute CVA and MRI showed an acute infarct in the right cerebral hemisphere and the patient came in with right-sided weakness and was noted to have slurred speech.  Neurology did not feel that the CVA is related to his symptoms and his right-sided weakness improved.  Patient continues to have persistent left-sided weakness and hemiplegia.  He was evaluated by CIR for candidacy however is not found to be a candidate for now PT OT is recommending SNF and currently being worked up for [facility] SNF and was going to be discharged to skilled nursing facility however he incidentally tested positive for COVID-19 and so will need 10-day isolation and will start him on remdesivir.  Overnight his WBC trended upwards and he spiked a temperature so we will panculture the patient and obtain blood cultures x2, urinalysis and urine culture as well as a procalcitonin level and lactic acid level. Currently is being treated for the  following but not limited to   Assessment & Plan:   Principal Problem:   Acute CVA (cerebrovascular accident) [facility]) Active Problems:   Hypertension associated with diabetes ([place])   Coronary atherosclerosis   Type 2 diabetes mellitus with complication, with long-term current use of insulin ([place])   AKI (acute kidney injury) ([place])   Hyperlipidemia associated with type 2 diabetes mellitus ([place])   Seizure disorder ([place])   Depression with anxiety  Right Leg Weakness; ? TIA -Worked up for CVA and had incidental findings as below -VTE [place] done and showed no DVT -In the setting of left-sided ICA stenosis possible syncope -CTA of the head and neck showed left ICA 60 to 70% stenosis -Neurology recommends avoiding dehydration and recommending blood pressure monitoring at home -Neurology also recommends outpatient follow-up with vascular surgery given his evidence of finding a CTA neck -Continue with aspirin and Plavix as he remains off of Eliquis -Right Leg Weakness improved   COVID-19 disease -Was not symptomatic but is having some diarrhea that could be attributed to COVID -Check inflammatory markers and trend daily -Patient has had both vaccines and booster  Recent Labs    11/21/20 0201 11/22/20 0542 11/23/20 0428  DDIMER 0.59* 0.71* 0.40  FERRITIN 116 148 109  LDH 182 163 133  CRP 4.3* 5.2* 7.7*   -Inflammatory Markers are stable Lab Results  Component Value Date   SARSCOV2NAA POSITIVE (A) 11/20/2020   [place] NEGATIVE 11/13/2020   [place] NEGATIVE 11/24/2018   -SpO2: 94 % on ROOM AIR -Currently not symptomatic but patient's daughter does state that he does cough with his food and has had some  intermittent wheezing -Check chest x-ray and showed "Cardiomediastinal silhouette unchanged in size and contour. Surgical changes of median  sternotomy and CABG. Calcifications of the aortic arch. Similar appearance of low lung volumes with predominantly reticular opacities  of the lungs. No pneumothorax. No large pleural effusion. Similar appearance of hazy opacity at the left costophrenic angle." -Will add Flutter Valve an Incentive Spirometry  -Start Remdesivir Course -Airborne and contact precaution -Zinc and vitamin C -We will add Combivent 1 puff every 6 as needed  AKI Acute Urinary Rentention -Creatinine on admission was 1.3 -Patient's BUN/Cr has been worsening and went from 14/0.87 -> 19/1.07 -> 30/1.49 -We will work-up the patient for further evaluation and give the patient 500 mL bolus and start him on maintenance IV fluid hydration -Check urine awesome's, urine sodium, urine creatinine, urinalysis with urine culture as well as a renal ultrasound -Avoid further nephrotoxic medications, contrast dyes, hypotension and renally dose medications -Continue to monitor and trend renal function carefully repeat CMP in a.m. -Monitor urine output.  Losartan on hold.   -Patient was in and out cath given that he was having acute urinary retention  Fever and Leukocytosis improving slightly -Patient Meets 2 SIRS Criteria but does not appear Septic and denies any burning of discomfort and Denies SOB -WBC went from 5.4 -> 10.6 and then trended up to 10.7 today -Was Febrile overnight with a Tmax of 101.2 -Will give a 500 mL bolus yesterday and will give another 500 mL bolus today and start on maintenance IV fluid'@5'$  MLS per hour given his renal dysfunction -Check Blood Cx 2, Urinalysis and Urine Cx, and CXR yesterday AM showed "The heart size and mediastinal contours are stable. Mild interstitial opacities are identified in bilateral lungs unchanged. There is probable minimal left pleural effusion. The visualized skeletal structures are stable." -Repeat chest x-ray this morning shows "Lung volumes are small, but are symmetric and are stable since prior examination. Mild left basilar atelectasis. No pneumothorax or pleural effusion. Coronary artery bypass grafting has been  performed. Cardiac size is within normal limits when accounting for poor pulmonary insufflation. Vascular crowding is noted at the hila secondary to poor pulmonary insufflation. No acute bone abnormality." -Urinalysis and urine culture still pending for unclear reasons -Check Procalcitonin Level and was less than 0.10 and repeat was 0.14, Lactic Acid Level was elevated 2.7 and then trended up to 2.8 so we will give additional IV fluid hydration -Obtain Cx's as above and if necessary will place on Empiric Abx we will hold off until the urinalysis and urine cultures collected -Continue monitor temperature curve and WBC  Acute CVA likely incidental -Heard CT done and showed No acute intracranial abnormality -EEG showed  "This EEG was recorded in the drowsy and sleeping state, the lack of waking state limits the ability to assess for encephalopathy. There was no seizure or seizure predisposition recorded on this study. Please note that lack of epileptiform activity on EEG does not preclude the possibility of epilepsy." -MRI showed acute infarct and read it as "Two punctate foci of acute ischemia within the subcortical right frontal lobe, 1 of which is along the right precentral gyrus. No hemorrhage or mass effect. Advanced white matter disease most consistent with chronic small vessel ischemia." -MRA showed "Normal intracranial MRA." -Patient came in with right-sided weakness which resolved  -Patient has prior history of CVA with residual left-sided hemiplegia.  -Also was noted to have slurred speech.   -Echocardiogram as below.  It showed normal systolic function.  Grade 1 diastolic dysfunction.  Mild LVH was noted.  -Patient was initially continued on Eliquis and aspirin.  After discussions between patient and family and neurology and review of his previous studies patient has been transitioned to aspirin and Plavix. -Noted to be on atorvastatin as well.  LDL noted to be 52.  HbA1c 7.8.   -CT angiogram  head and neck does 60 presents to notices left ICA and 60% vertebral artery stenosis.  Neurology recommends follow-up with vascular surgery in the outpatient setting.   -Patient also underwent EEG which did not show any epileptiform activity -Seen by PT and OT and SLP.  Inpatient rehabilitation was recommended but his insurance authorization has been denied technology we will pursue SNF and awaiting Insurance Authorization and bed availability and he was also to be discharged today however on repeat COVID testing he tested positive so we will need to be isolated for 10 days hard to be discharging to SNF  History of paroxysmal atrial fibrillation -Recently started on Eliquis by his cardiologist at the [facility]. however apparently patient's 30-day monitor did not show any evidence for atrial fibrillation.  -Initial episode of atrial fibrillation was in the setting of CABG in the postoperative period.  -Neurology feels patient would be better served by being on dual antiplatelet treatment.   -So he has been changed over to aspirin and Plavix after discussions between neurology and the patient and patient's daughter.   -Metoprolol was currently on hold for permissive hypertension but resumed at a lower dose -Blood pressure is relatively stable 120/67   Left lower extremity edema -Dopplers negative for DVT.  Most likely dependent edema.  Essential Hypertension -Permissive hypertension was allowed.  Losartan and metoprolol were placed on hold.  Blood pressure remains low normal.  -Metoprolol resumed primarily for A. fib at a lower dose.  Continue to hold losartan.   -Continue to monitor blood pressures per protocol last blood pressure reading was 120/67  Diabetes mellitus type 2, insulin-dependent -Noted to be on Toujeo at home.  Currently on glargine insulin.  -HbA1c is 7.8.   -Dose of glargine was adjusted slightly the day beforeyesterday.  -CBG noted to be 91 yesterday morning.   -We will see how the  trends are during the rest of the day and may need to cut back if it remains on the lower side.   -Home medication list shows that he is also supposed to be on -Jardiance and Tradjenta prior to admission.  These are currently on hold.   -We will add NovoLog 3 units 3 times daily with meals if he eats greater than 50% of his meals -CBG's ranging from 133-185  Hypomagnesemia -Patient's Mag Level was 1.8  -Replete with IV Mag Sulfate 2 Grams again  -Continue to Monitor and Replete as Necessary -Repeat Mag Level in the the AM   Diarrhea -In the setting of Stool Softeners initially but they have been stopped and now in the setting of likely COVID-19 disease -Now the patient is febrile and has a leukocytosis so if he continues to have diarrhea will order infectious diarrhea work-up -Continue to Monitor and Trend   Abnormal LFTs -Worsening slightly and AST went from 59 trended up to 70 and it is now trended down to 37 -ALT went from 33 is now 50 is now trended down to 34 -We will consider holding his statin if it continues to elevate -If hepatic function continues to worsen or not improve we will obtain a right upper quadrant  ultrasound as well as an acute hepatitis panel -Repeat CMP in a.m.  Normocytic Anemia -Patient's Hgb/Hct is relatively stable but slowly dropping as it has gone from 12.7/37.5 -> 12.2/35.6 -> 11.7/34.4 -> 12.7/37.7 -> 11.6/34.6 -Check Anemia Panel in the AM -Continue to Monitor for S/Sx of Bleeding; No overt bleeding noted -Repeat CBC in the AM   History of Coronary Artery Disease status post CABG -Stable. Continue aspirin, statin, Zetia and low dose Metoprolol   History of Seizure Disorder -Continue Divalproex 500 mg po qHS.  Hyperlipidemia -Continue Atorvastatin 80 mg po Daily and Zetia.  May hold statin if LFTs continue to worsen  History of depression and anxiety -Continue Esctialopram 10 mg po Daily and Doxepin 25 mg qHS  DVT prophylaxis: Enoxparin 40 mg  sq q24h Code Status: FULL CODE  Family Communication: Discussed with family at bedside Disposition Plan: SNF   Status is: Inpatient  Remains inpatient appropriate because:Unsafe d/c plan, IV treatments appropriate due to intensity of illness or inability to take PO, and Inpatient level of care appropriate due to severity of illness  Dispo:  Patient From: Home  Planned Disposition: [place]  Medically stable for discharge: Yes  Consultants:  Neurology  Procedures:  ECHOCARDIOGRAM IMPRESSIONS     1. Left ventricular ejection fraction, by estimation, is 60 to 65%. The  left ventricle has normal function. The left ventricle has no regional  wall motion abnormalities. There is mild left ventricular hypertrophy.  Left ventricular diastolic parameters  are consistent with Grade I diastolic dysfunction (impaired relaxation).   2. Right ventricular systolic function is normal. The right ventricular  size is normal. There is normal pulmonary artery systolic pressure.   3. Left atrial size was moderately dilated.   4. The mitral valve is normal in structure. No evidence of mitral valve  regurgitation. No evidence of mitral stenosis.   5. The aortic valve is normal in structure. Aortic valve regurgitation is  trivial. Mild to moderate aortic valve sclerosis/calcification is present,  without any evidence of aortic stenosis.   6. The inferior vena cava is normal in size with greater than 50%  respiratory variability, suggesting right atrial pressure of 3 mmHg.   Conclusion(s)/Recommendation(s): No intracardiac source of embolism  detected on this transthoracic study. A transesophageal echocardiogram is  recommended to exclude cardiac source of embolism if clinically indicated.   FINDINGS   Left Ventricle: Left ventricular ejection fraction, by estimation, is 60  to 65%. The left ventricle has normal function. The left ventricle has no  regional wall motion  abnormalities. The left ventricular internal cavity  size was normal in size. There is   mild left ventricular hypertrophy. Left ventricular diastolic parameters  are consistent with Grade I diastolic dysfunction (impaired relaxation).   Right Ventricle: The right ventricular size is normal. No increase in  right ventricular wall thickness. Right ventricular systolic function is  normal. There is normal pulmonary artery systolic pressure. The tricuspid  regurgitant velocity is 2.55 m/s, and   with an assumed right atrial pressure of 3 mmHg, the estimated right  ventricular systolic pressure is 0000000 mmHg.   Left Atrium: Left atrial size was moderately dilated.   Right Atrium: Right atrial size was normal in size.   Pericardium: There is no evidence of pericardial effusion.   Mitral Valve: The mitral valve is normal in structure. No evidence of  mitral valve regurgitation. No evidence of mitral valve stenosis.   Tricuspid Valve: The tricuspid valve is normal in  structure. Tricuspid  valve regurgitation is not demonstrated. No evidence of tricuspid  stenosis.   Aortic Valve: The aortic valve is normal in structure. Aortic valve  regurgitation is trivial. Mild to moderate aortic valve  sclerosis/calcification is present, without any evidence of aortic  stenosis. Aortic valve mean gradient measures 6.0 mmHg. Aortic  valve peak gradient measures 11.4 mmHg. Aortic valve area, by VTI measures  1.58 cm.   Pulmonic Valve: The pulmonic valve was normal in structure. Pulmonic valve  regurgitation is not visualized. No evidence of pulmonic stenosis.   Aorta: The aortic root is normal in size and structure.   Venous: The inferior vena cava is normal in size with greater than 50%  respiratory variability, suggesting right atrial pressure of 3 mmHg.   IAS/Shunts: No atrial level shunt detected by color flow Doppler.      LEFT VENTRICLE  PLAX 2D  LVIDd:         3.80 cm     Diastology   LVIDs:         2.60 cm     LV e' medial:    6.20 cm/s  LV PW:         1.20 cm     LV E/e' medial:  15.8  LV IVS:        1.10 cm     LV e' lateral:   8.05 cm/s  LVOT diam:     2.00 cm     LV E/e' lateral: 12.2  LV SV:         62  LV SV Index:   35  LVOT Area:     3.14 cm     LV Volumes (MOD)  LV vol d, MOD A2C: 64.0 ml  LV vol d, MOD A4C: 77.4 ml  LV vol s, MOD A2C: 25.8 ml  LV vol s, MOD A4C: 30.0 ml  LV SV MOD A2C:     38.2 ml  LV SV MOD A4C:     77.4 ml  LV SV MOD BP:      44.4 ml   RIGHT VENTRICLE  RV Basal diam:  3.15 cm  RV Mid diam:    3.80 cm  RV S prime:     8.39 cm/s  TAPSE (M-mode): 1.7 cm   LEFT ATRIUM             Index       RIGHT ATRIUM           Index  LA diam:        4.90 cm 2.75 cm/m  RA Area:     11.90 cm  LA Vol (A2C):   56.0 ml 31.43 ml/m RA Volume:   21.40 ml  12.01 ml/m  LA Vol (A4C):   57.5 ml 32.27 ml/m  LA Biplane Vol: 58.6 ml 32.89 ml/m   AORTIC VALVE  AV Area (Vmax):    1.59 cm  AV Area (Vmean):   1.56 cm  AV Area (VTI):     1.58 cm  AV Vmax:           169.00 cm/s  AV Vmean:          115.000 cm/s  AV VTI:            0.392 m  AV Peak Grad:      11.4 mmHg  AV Mean Grad:      6.0 mmHg  LVOT Vmax:         85.50 cm/s  LVOT Vmean:        57.100 cm/s  LVOT VTI:          0.197 m  LVOT/AV VTI ratio: 0.50   MITRAL VALVE                TRICUSPID VALVE  MV Area (PHT): 3.37 cm     TR Peak grad:   26.0 mmHg  MV Decel Time: 225 msec     TR Vmax:        255.00 cm/s  MV E velocity: 98.10 cm/s  MV A velocity: 118.00 cm/s  SHUNTS  MV E/A ratio:  0.83         Systemic VTI:  0.20 m                              Systemic Diam: 2.00 cm   Antimicrobials:  Anti-infectives (From admission, onward)    Start     Dose/Rate Route Frequency Ordered Stop   11/21/20 1000  remdesivir 100 mg in sodium chloride 0.9 % 100 mL IVPB       See Hyperspace for full Linked Orders Report.   100 mg 200 mL/hr over 30 Minutes Intravenous Daily 11/20/20 1520 11/25/20  0959   11/20/20 1615  remdesivir 200 mg in sodium chloride 0.9% 250 mL IVPB       See Hyperspace for full Linked Orders Report.   200 mg 580 mL/hr over 30 Minutes Intravenous Once 11/20/20 1520 11/20/20 1834        Subjective: Seen and examined at bedside and he is getting a bath.  Felt okay.  Renal function worsened and he is retaining acute urine flow he was given In-N-Out cath.  Has started on maintenance IV fluid hydration.  We will work-up for renal dysfunction and if continues to worsen may need nephrology involvement.  Patient denies any chest pain or shortness of breath.  Objective: Vitals:   11/23/20 0500 11/23/20 0809 11/23/20 0851 11/23/20 1527  BP: (!) 101/53 (!) 160/78 (!) 160/75 120/67  Pulse:  75 76 75  Resp: '16 19  16  '$ Temp: (!) 97.2 F (36.2 C) 97.8 F (36.6 C)  98.3 F (36.8 C)  TempSrc: Oral Oral  Oral  SpO2: 95% 96%  94%  Weight:      Height:        Intake/Output Summary (Last 24 hours) at 11/23/2020 1819 Last data filed at 11/23/2020 1500 Gross per 24 hour  Intake 523.75 ml  Output --  Net 523.75 ml    Filed Weights   11/13/20 1900 11/13/20 2043  Weight: 82.6 kg 74.8 kg   Examination: Physical Exam:  Constitutional: WN/WD overweight Caucasian male currently in no acute distress sitting in the chair bedside and appears calm  Eyes: Lids and conjunctivae normal, sclerae anicteric  ENMT: External Ears, Nose appear normal. Grossly normal hearing.  Neck: Appears normal, supple, no cervical masses, normal ROM, no appreciable thyromegaly: No appreciable JVD Respiratory: Diminished to auscultation bilaterally, no wheezing, rales, rhonchi or crackles. Normal respiratory effort and patient is not tachypenic. No accessory muscle use.  Cardiovascular: RRR, no murmurs / rubs / gallops. S1 and S2 auscultated.  Some mild lower extremity edema Abdomen: Soft, non-tender, distended secondary body habitus.  Bowel sounds positive.  GU: Deferred. Musculoskeletal: No  clubbing / cyanosis of digits/nails. No joint deformity upper and lower extremities but has some left-sided hemiplegia and left arm contracture Skin: No rashes,  lesions, ulcers on limited skin evaluation. No induration; Warm and dry.  Neurologic: CN 2-12 grossly intact with no focal deficits. Romberg sign and cerebellar reflexes not assessed.  Psychiatric: Normal judgment and insight. Alert and oriented x 3. Normal mood and appropriate affect.   Data Reviewed: I have personally reviewed following labs and imaging studies  CBC: Recent Labs  Lab 11/19/20 0349 11/20/20 0135 11/21/20 0201 11/22/20 0542 11/23/20 0428  WBC 7.2 7.2 5.4 10.6* 10.7*  NEUTROABS 3.6 3.6 3.6 6.5 5.9  HGB 12.2* 11.7* 12.7* 14.1 11.6*  HCT 35.6* 34.8* 37.7* 41.3 34.6*  MCV 95.4 95.3 95.7 96.5 95.6  PLT 207 222 198 241 [id number]    Basic Metabolic Panel: Recent Labs  Lab 11/19/20 0349 11/20/20 0135 11/21/20 0201 11/22/20 0542 11/23/20 0428  NA 139 139 137 137 135  K 3.9 4.0 4.1 3.7 3.6  CL 109 109 102 100 102  CO2 '24 25 25 26 25  '$ GLUCOSE 140* 130* 145* 95 176*  BUN '19 17 14 19 '$ 30*  CREATININE 0.95 1.13 0.87 1.07 1.49*  CALCIUM 8.6* 8.5* 8.8* 8.9 8.1*  MG 1.6* 1.8 1.7 1.7 1.8  PHOS 3.0 3.3 2.8 4.1 3.9    GFR: Estimated Creatinine Clearance: 34.1 mL/min (A) (by C-G formula based on SCr of 1.49 mg/dL (H)). Liver Function Tests: Recent Labs  Lab 11/19/20 0349 11/20/20 0135 11/21/20 0201 11/22/20 0542 11/23/20 0428  AST 16 16 50* 70* 37  ALT 15 17 33 50* 34  ALKPHOS 43 45 50 49 45  BILITOT 0.7 0.4 0.6 0.7 0.6  PROT 5.8* 5.6* 6.3* 6.8 5.4*  ALBUMIN 2.4* 2.4* 2.7* 2.9* 2.2*    No results for input(s): LIPASE, AMYLASE in the last 168 hours. No results for input(s): AMMONIA in the last 168 hours. Coagulation Profile: No results for input(s): INR, PROTIME in the last 168 hours.  Cardiac Enzymes: No results for input(s): CKTOTAL, CKMB, CKMBINDEX, TROPONINI in the last 168 hours. BNP (last 3  results) No results for input(s): PROBNP in the last 8760 hours. HbA1C: No results for input(s): HGBA1C in the last 72 hours. CBG: Recent Labs  Lab 11/22/20 1641 11/22/20 2221 11/23/20 0758 11/23/20 1146 11/23/20 1525  GLUCAP 205* 185* 135* 133* 138*    Lipid Profile: No results for input(s): CHOL, HDL, LDLCALC, TRIG, CHOLHDL, LDLDIRECT in the last 72 hours. Thyroid Function Tests: No results for input(s): TSH, T4TOTAL, FREET4, T3FREE, THYROIDAB in the last 72 hours. Anemia Panel: Recent Labs    11/22/20 0542 11/23/20 0428  FERRITIN 148 109    Sepsis Labs: Recent Labs  Lab 11/22/20 1711 11/22/20 1858 11/23/20 0428  PROCALCITON <0.10  --  0.14  LATICACIDVEN 2.7* 2.8*  --     Recent Results (from the past 240 hour(s))  Resp Panel by RT-PCR (Flu A&B, Covid) Nasopharyngeal Swab     Status: None   Collection Time: 11/13/20  7:39 PM   Specimen: Nasopharyngeal Swab; Nasopharyngeal(NP) swabs in vial transport medium  Result Value Ref Range Status   SARS Coronavirus 2 by RT PCR NEGATIVE NEGATIVE Final    Comment: (NOTE) SARS-CoV-2 target nucleic acids are NOT DETECTED.  The SARS-CoV-2 RNA is generally detectable in upper respiratory specimens during the acute phase of infection. The lowest concentration of SARS-CoV-2 viral copies this assay can detect is 138 copies/mL. A negative result does not preclude SARS-Cov-2 infection and should not be used as the sole basis for treatment or other patient management decisions. A negative result may occur  with  improper specimen collection/handling, submission of specimen other than nasopharyngeal swab, presence of viral mutation(s) within the areas targeted by this assay, and inadequate number of viral copies(<138 copies/mL). A negative result must be combined with clinical observations, patient history, and epidemiological information. The expected result is Negative.  Fact Sheet for Patients:   EntrepreneurPulse.com.au  Fact Sheet for Healthcare Providers:  IncredibleEmployment.be  This test is no t yet approved or cleared by the [place] FDA and  has been authorized for detection and/or diagnosis of SARS-CoV-2 by FDA under an Emergency Use Authorization (EUA). This EUA will remain  in effect (meaning this test can be used) for the duration of the COVID-19 declaration under Section 564(b)(1) of the Act, 21 U.S.C.section 360bbb-3(b)(1), unless the authorization is terminated  or revoked sooner.       Influenza A by PCR NEGATIVE NEGATIVE Final   Influenza B by PCR NEGATIVE NEGATIVE Final    Comment: (NOTE) The Xpert Xpress SARS-CoV-2/FLU/RSV plus assay is intended as an aid in the diagnosis of influenza from Nasopharyngeal swab specimens and should not be used as a sole basis for treatment. Nasal washings and aspirates are unacceptable for Xpert Xpress SARS-CoV-2/FLU/RSV testing.  Fact Sheet for Patients: EntrepreneurPulse.com.au  Fact Sheet for Healthcare Providers: [employer]  This test is not yet approved or cleared by the [place] FDA and has been authorized for detection and/or diagnosis of SARS-CoV-2 by FDA under an Emergency Use Authorization (EUA). This EUA will remain in effect (meaning this test can be used) for the duration of the COVID-19 declaration under Section 564(b)(1) of the Act, 21 U.S.C. section 360bbb-3(b)(1), unless the authorization is terminated or revoked.  Performed at [facility], [place] [address] [place], [place] [address]   Resp Panel by RT-PCR (Flu A&B, Covid) Nasopharyngeal Swab     Status: Abnormal   Collection Time: 11/20/20  9:06 AM   Specimen: Nasopharyngeal Swab; Nasopharyngeal(NP) swabs in vial transport medium  Result Value Ref Range Status   SARS Coronavirus 2 by RT PCR POSITIVE (A) NEGATIVE Final    Comment: RESULT CALLED  TO, READ BACK BY AND VERIFIED WITH: R,CROGHAN '@1111'$  11/20/20 EB (NOTE) SARS-CoV-2 target nucleic acids are DETECTED.  The SARS-CoV-2 RNA is generally detectable in upper respiratory specimens during the acute phase of infection. Positive results are indicative of the presence of the identified virus, but do not rule out bacterial infection or co-infection with other pathogens not detected by the test. Clinical correlation with patient history and other diagnostic information is necessary to determine patient infection status. The expected result is Negative.  Fact Sheet for Patients: EntrepreneurPulse[employer]com.au  Fact Sheet for Healthcare Providers: [employer]  This test is not yet approved or cleared by the [place] FDA and  has been authorized for detection and/or diagnosis of SARS-CoV-2 by FDA under an Emergency Use Authorization (EUA).  This EUA will remain in effect (meaning this test can be used) f or the duration of  the COVID-19 declaration under Section 564(b)(1) of the Act, 21 U.S.C. section 360bbb-3(b)(1), unless the authorization is terminated or revoked sooner.     Influenza A by PCR NEGATIVE NEGATIVE Final   Influenza B by PCR NEGATIVE NEGATIVE Final    Comment: (NOTE) The Xpert Xpress SARS-CoV-2/FLU/RSV plus assay is intended as an aid in the diagnosis of influenza from Nasopharyngeal swab specimens and should not be used as a sole basis for treatment. Nasal washings and aspirates are unacceptable for Xpert Xpress SARS-CoV-2/FLU/RSV testing.  Fact Sheet for Patients: EntrepreneurPulse.com.au  Fact Sheet for Healthcare Providers: [employer]  This test is not yet approved or cleared by the [place] FDA and has been authorized for detection and/or diagnosis of SARS-CoV-2 by FDA under an Emergency Use Authorization (EUA). This EUA will remain in effect (meaning  this test can be used) for the duration of the COVID-19 declaration under Section 564(b)(1) of the Act, 21 U.S.C. section 360bbb-3(b)(1), unless the authorization is terminated or revoked.  Performed at [facility], [place] [address], [place], [place] [address]   Culture, blood (routine x 2)     Status: None (Preliminary result)   Collection Time: 11/22/20  4:45 PM   Specimen: BLOOD RIGHT HAND  Result Value Ref Range Status   Specimen Description BLOOD RIGHT HAND  Final   Special Requests   Final    BOTTLES DRAWN AEROBIC ONLY Blood Culture results may not be optimal due to an inadequate volume of blood received in culture bottles   Culture   Final    NO GROWTH < 24 HOURS Performed at [facility], [place] [address] [place], [place] [address]    Report Status PENDING  Incomplete  Culture, blood (routine x 2)     Status: None (Preliminary result)   Collection Time: 11/22/20  5:11 PM   Specimen: BLOOD RIGHT ARM  Result Value Ref Range Status   Specimen Description BLOOD RIGHT ARM  Final   Special Requests   Final    BOTTLES DRAWN AEROBIC AND ANAEROBIC Blood Culture results may not be optimal due to an inadequate volume of blood received in culture bottles   Culture   Final    NO GROWTH < 24 HOURS Performed at [facility], [place] [address], [place], [place] [address]    Report Status PENDING  Incomplete   RN Pressure Injury Documentation:     Estimated body mass index is 29.23 kg/m as calculated from the following:   Height as of this encounter: '5\' 3"'$  (1.6 m).   Weight as of this encounter: 74.8 kg.  Malnutrition Type:   Malnutrition Characteristics:  Nutrition Interventions:     Radiology Studies: DG CHEST PORT 1 VIEW  Result Date: 11/23/2020 CLINICAL DATA:  Dyspnea, atrial fibrillation EXAM: PORTABLE CHEST 1 VIEW COMPARISON:  11/22/2020 FINDINGS: Lung volumes are small, but are symmetric and are stable since prior examination. Mild left basilar  atelectasis. No pneumothorax or pleural effusion. Coronary artery bypass grafting has been performed. Cardiac size is within normal limits when accounting for poor pulmonary insufflation. Vascular crowding is noted at the hila secondary to poor pulmonary insufflation. No acute bone abnormality. IMPRESSION: Stable pulmonary hypoinflation. Electronically Signed   By: Fidela Salisbury M.D.   On: 11/23/2020 09:24   DG CHEST PORT 1 VIEW  Result Date: 11/22/2020 CLINICAL DATA:  Shortness of breath EXAM: PORTABLE CHEST 1 VIEW COMPARISON:  November 21, 2020 FINDINGS: The heart size and mediastinal contours are stable. Mild interstitial opacities are identified in bilateral lungs unchanged. There is probable minimal left pleural effusion. The visualized skeletal structures are stable. IMPRESSION: Mild interstitial opacities are identified in bilateral lungs unchanged. Electronically Signed   By: Abelardo Diesel M.D.   On: 11/22/2020 08:43    Scheduled Meds:   stroke: mapping our early stages of recovery book   Does not apply Once   allopurinol  300 mg Oral Daily   vitamin C  500 mg Oral Daily   aspirin EC  81 mg Oral Daily   atorvastatin  80 mg Oral Daily   baclofen  10 mg Oral BID   clopidogrel  75 mg Oral Daily   divalproex  500 mg Oral QHS   doxepin  25 mg Oral QHS   enoxaparin (LOVENOX) injection  40 mg Subcutaneous Q24H   escitalopram  10 mg Oral Daily   ezetimibe  10 mg Oral Daily   insulin aspart  0-9 Units Subcutaneous TID WC   insulin aspart  3 Units Subcutaneous TID WC   insulin glargine-yfgn  24 Units Subcutaneous QHS   metoprolol succinate  12.5 mg Oral Daily   zinc sulfate  220 mg Oral Daily   Continuous Infusions:  sodium chloride 75 mL/hr at 11/23/20 1121   remdesivir 100 mg in NS 100 mL 100 mg (11/23/20 0939)    LOS: 9 days   Kerney Elbe, DO [facility] PAGER is on AMION  If 7PM-7AM, please contact night-coverage [email or web address]

## 2020-11-23 NOTE — Progress Notes (Addendum)
Occupational Therapy Treatment Patient Details Name: Fred [name] MRN: [id number] DOB: [birth year] Today's Date: 11/23/2020   History of present illness Pt is an 85 y/o male admitted 9/2 secondary to new onset R weakness. Found to have R frontal lobe infarct. 9/3 rapid response contacted for confusion and disorientation. 9/9 COVID (+).  PMH includes HTN, DM, seizures, a fib, CVA with L sided weakness, CHF, and CAD.   OT comments  Unfortunately pt unable to demonstrate progress towards OT goals today with need of assistance for peri care due to bowel incontinence following bed mobility and move to chair.  Pt required Total assist for peri care, Total assist with lateral scoot to drop-arm recliner moving to pt's stronger RT side, Max assist for bed mobility, and pt able to perform grooming tasks with setup while in recliner. Patient remains limited by generalized weakness/stiffness, LT hemiplegia, possible confusion, and decreased activity tolerance along with deficits noted below. Pt is pleasant and cooperative and continues to demonstrate good rehab potential and would benefit from continued skilled OT to increase safety and independence with ADLs and functional transfers to allow pt to return home safely and reduce caregiver burden and fall risk.      Recommendations for follow up therapy are one component of a multi-disciplinary discharge planning process, led by the attending physician.  Recommendations may be updated based on patient status, additional functional criteria and insurance authorization.    Follow Up Recommendations  SNF    Equipment Recommendations  None recommended by OT    Recommendations for Other Services Rehab consult    Precautions / Restrictions Precautions Precautions: Fall Precaution Comments: left hemiplegia Restrictions Weight Bearing Restrictions: No       Mobility Bed Mobility Overal bed mobility: Needs Assistance Bed Mobility: Supine to  Sit;Rolling Rolling: Max assist   Supine to sit: Max assist;HOB elevated     General bed mobility comments: Pt given step by step instructions with multimodal cues. Pt rolled to RT side then required Max Assist of 1 person to advance LEs off bed and to raise trunk.    Transfers Overall transfer level: Needs assistance Equipment used: None Transfers: Lateral/Scoot Transfers          Lateral/Scoot Transfers: Total assist      Balance Overall balance assessment: Needs assistance Sitting-balance support: Single extremity supported;Feet unsupported Sitting balance-Leahy Scale: Fair         Standing balance comment: Standing NT                           ADL either performed or assessed with clinical judgement   ADL Overall ADL's : Needs assistance/impaired     Grooming: Oral care;Wash/dry face;Set up;Sitting Grooming Details (indicate cue type and reason): While in recliner, pt performed oral care in form of rinsing mouth and gently brush tongue and gums as well as washing face with setup and verbal cues to initiate. Pt educated on importance of continued oral hygiene despite lack of dentition Upper Body Bathing: Maximal assistance;Sitting Upper Body Bathing Details (indicate cue type and reason): Max As to bath chest and under arms while in recliner. Pt assisting by lifting RT UE. Lower Body Bathing: Total assistance;+2 for physical assistance Lower Body Bathing Details (indicate cue type and reason): Please see toileting below.         Toilet Transfer: Total assistance Toilet Transfer Details (indicate cue type and reason): Pt unable to clear hips for squat pivot today  despite max as and cues for pushing with RT UE/LE and blicking Bil knees and feet. Pt instead performed latearl scoot to drop-arm recliner with Total Assist to strong RT side. Step by step cues throughout and pt reaching with RUE for arm of recliner in attempts to assist, but remained total assist  for mobility and safety. Toileting- Clothing Manipulation and Hygiene: Bed level;Total assistance;+2 for physical assistance Toileting - Clothing Manipulation Details (indicate cue type and reason): Pt found to be soiled once OOB. RN assisted with total assist peri hygiene while OT assisted pt to roll to RT side to allow RN access.     Functional mobility during ADLs: Maximal assistance;Total assistance;+2 for physical assistance       Vision       Perception     Praxis      Cognition Arousal/Alertness: Awake/alert Behavior During Therapy: WFL for tasks assessed/performed Overall Cognitive Status: No family/caregiver present to determine baseline cognitive functioning Area of Impairment: Orientation                 Orientation Level: Disoriented to;Time;Situation             General Comments: Some confusion with pt repeating self. May be hearing vs aphasia vs cognitive deficit.        Exercises     Shoulder Instructions       General Comments      Pertinent Vitals/ Pain       Pain Assessment: No/denies pain Faces Pain Scale: No hurt  Home Living                                          Prior Functioning/Environment              Frequency  Min 2X/week        Progress Toward Goals  OT Goals(current goals can now be found in the care plan section)  Progress towards OT goals: Not progressing toward goals - comment  Acute Rehab OT Goals Patient Stated Goal: Get out of bed OT Goal Formulation: With patient Time For Goal Achievement: 11/30/20 Potential to Achieve Goals: Good  Plan Discharge plan remains appropriate    Co-evaluation                 AM-PAC OT "6 Clicks" Daily Activity     Outcome Measure   Help from another person eating meals?: A Little Help from another person taking care of personal grooming?: A Little Help from another person toileting, which includes using toliet, bedpan, or urinal?:  Total Help from another person bathing (including washing, rinsing, drying)?: A Lot Help from another person to put on and taking off regular upper body clothing?: A Lot Help from another person to put on and taking off regular lower body clothing?: Total 6 Click Score: 12    End of Session Equipment Utilized During Treatment: Gait belt  OT Visit Diagnosis: Unsteadiness on feet (R26.81);Muscle weakness (generalized) (M62.81);Hemiplegia and hemiparesis Hemiplegia - Right/Left: Left Hemiplegia - dominant/non-dominant: Non-Dominant Hemiplegia - caused by: Cerebral infarction   Activity Tolerance Patient tolerated treatment well   Patient Left in chair;with call bell/phone within reach;with chair alarm set;with nursing/sitter in room   Nurse Communication Mobility status (RN assisted with peri care.)        Time: [id number] OT Time Calculation (min): 30 min  Charges: OT General Charges $OT  Visit: 1 Visit OT Treatments $Self Care/Home Management : 8-22 mins $Therapeutic Activity: 8-22 mins  Anderson Malta, OT Acute Rehab Services Office: [phone] 11/23/2020  Julien Girt 11/23/2020, 11:44 AM

## 2020-11-23 NOTE — Progress Notes (Signed)
The patient was running temp of 102.8, Tylenol 650 mg oral administered and rechecked reading was 97.8. Dr. Tonie Griffith was notified without any new orders. Will continue to monitor.

## 2020-11-24 ENCOUNTER — Inpatient Hospital Stay (HOSPITAL_COMMUNITY): Payer: Medicare Other

## 2020-11-24 DIAGNOSIS — I2510 Atherosclerotic heart disease of native coronary artery without angina pectoris: Secondary | ICD-10-CM | POA: Diagnosis not present

## 2020-11-24 DIAGNOSIS — I639 Cerebral infarction, unspecified: Secondary | ICD-10-CM | POA: Diagnosis not present

## 2020-11-24 DIAGNOSIS — N179 Acute kidney failure, unspecified: Secondary | ICD-10-CM | POA: Diagnosis not present

## 2020-11-24 DIAGNOSIS — F418 Other specified anxiety disorders: Secondary | ICD-10-CM | POA: Diagnosis not present

## 2020-11-24 LAB — COMPREHENSIVE METABOLIC PANEL
ALT: 26 U/L (ref 0–44)
AST: 27 U/L (ref 15–41)
Albumin: 2.3 g/dL — ABNORMAL LOW (ref 3.5–5.0)
Alkaline Phosphatase: 41 U/L (ref 38–126)
Anion gap: 9 (ref 5–15)
BUN: 29 mg/dL — ABNORMAL HIGH (ref 8–23)
CO2: 23 mmol/L (ref 22–32)
Calcium: 8.2 mg/dL — ABNORMAL LOW (ref 8.9–10.3)
Chloride: 105 mmol/L (ref 98–111)
Creatinine, Ser: 1.11 mg/dL (ref 0.61–1.24)
GFR, Estimated: >60 mL/min (ref >60)
Glucose, Bld: 102 mg/dL — ABNORMAL HIGH (ref 70–99)
Potassium: 3.6 mmol/L (ref 3.5–5.1)
Sodium: 137 mmol/L (ref 135–145)
Total Bilirubin: 0.6 mg/dL (ref 0.3–1.2)
Total Protein: 5.4 g/dL — ABNORMAL LOW (ref 6.5–8.1)

## 2020-11-24 LAB — SEDIMENTATION RATE: Sed Rate: 26 mm/hr — ABNORMAL HIGH (ref 0–16)

## 2020-11-24 LAB — C-REACTIVE PROTEIN: CRP: 7.8 mg/dL — ABNORMAL HIGH (ref <1.0)

## 2020-11-24 LAB — CBC WITH DIFFERENTIAL/PLATELET
Abs Immature Granulocytes: 0.08 10*3/uL — ABNORMAL HIGH (ref 0.00–0.07)
Basophils Absolute: 0 10*3/uL (ref 0.0–0.1)
Basophils Relative: 1 %
Eosinophils Absolute: 0.3 10*3/uL (ref 0.0–0.5)
Eosinophils Relative: 4 %
HCT: 35.6 % — ABNORMAL LOW (ref 39.0–52.0)
Hemoglobin: 12.2 g/dL — ABNORMAL LOW (ref 13.0–17.0)
Immature Granulocytes: 1 %
Lymphocytes Relative: 29 %
Lymphs Abs: 2.4 10*3/uL (ref 0.7–4.0)
MCH: 32.6 pg (ref 26.0–34.0)
MCHC: 34.3 g/dL (ref 30.0–36.0)
MCV: 95.2 fL (ref 80.0–100.0)
Monocytes Absolute: 1.1 10*3/uL — ABNORMAL HIGH (ref 0.1–1.0)
Monocytes Relative: 14 %
Neutro Abs: 4.2 10*3/uL (ref 1.7–7.7)
Neutrophils Relative %: 51 %
Platelets: 210 10*3/uL (ref 150–400)
RBC: 3.74 MIL/uL — ABNORMAL LOW (ref 4.22–5.81)
RDW: 14.6 % (ref 11.5–15.5)
WBC: 8.1 10*3/uL (ref 4.0–10.5)
nRBC: 0 % (ref 0.0–0.2)

## 2020-11-24 LAB — GLUCOSE, CAPILLARY
Glucose-Capillary: 82 mg/dL (ref 70–99)
Glucose-Capillary: 103 mg/dL — ABNORMAL HIGH (ref 70–99)
Glucose-Capillary: 164 mg/dL — ABNORMAL HIGH (ref 70–99)

## 2020-11-24 LAB — PHOSPHORUS: Phosphorus: 2.7 mg/dL (ref 2.5–4.6)

## 2020-11-24 LAB — D-DIMER, QUANTITATIVE: D-Dimer, Quant: 0.35 ug/mL-FEU (ref 0.00–0.50)

## 2020-11-24 LAB — LACTATE DEHYDROGENASE: LDH: 144 U/L (ref 98–192)

## 2020-11-24 LAB — PROCALCITONIN
Procalcitonin: 28.5 ng/mL
Procalcitonin: <0.1 ng/mL

## 2020-11-24 LAB — FIBRINOGEN: Fibrinogen: 490 mg/dL — ABNORMAL HIGH (ref 210–475)

## 2020-11-24 LAB — FERRITIN: Ferritin: 125 ng/mL (ref 24–336)

## 2020-11-24 LAB — MAGNESIUM: Magnesium: 1.7 mg/dL (ref 1.7–2.4)

## 2020-11-24 MED ORDER — MAGNESIUM SULFATE 2 GM/50ML IV SOLN
2.0000 g | Freq: Once | INTRAVENOUS | Status: AC
  Administered 2020-11-24: 2 g via INTRAVENOUS
  Filled 2020-11-24: qty 50

## 2020-11-24 NOTE — Progress Notes (Signed)
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  Symptoms apparently began 2 days prior to admission and patient was evaluated in the ED.  He underwent MRI which actually showed a right-sided small stroke.  He was hospitalized for further management and he was previously on aspirin Plavix and recently taken off Plavix and started on Eliquis by his cardiologist at the [facility] for diagnosis of atrial fibrillation.  Neurology evaluated for his acute CVA and MRI showed an acute infarct in the right cerebral hemisphere and the patient came in with right-sided weakness and was noted to have slurred speech.  Neurology did not feel that the CVA is related to his symptoms and his right-sided weakness improved.  Patient continues to have persistent left-sided weakness and hemiplegia.  He was evaluated by CIR for candidacy however is not found to be a candidate for now PT OT is recommending SNF and currently being worked up for [facility] SNF and was going to be discharged to skilled nursing facility however he incidentally tested positive for COVID-19 and so will need 10-day isolation and will start him on remdesivir.    His WBC trended upwards and he spiked a temperature so we will panculture the patient and obtain blood cultures x2, urinalysis and urine culture as well as a procalcitonin level and lactic acid level. Currently is being treated for the  following but not limited to   Assessment & Plan:   Principal Problem:   Acute CVA (cerebrovascular accident) [facility]) Active Problems:   Hypertension associated with diabetes ([place])   Coronary atherosclerosis   Type 2 diabetes mellitus with complication, with long-term current use of insulin ([place])   AKI (acute kidney injury) ([place])   Hyperlipidemia associated with type 2 diabetes mellitus ([place])   Seizure disorder ([place])   Depression with anxiety  Right Leg Weakness; ? TIA -Worked up for CVA and had incidental findings as below -VTE [place] done and showed no DVT -In the setting of left-sided ICA stenosis possible syncope -CTA of the head and neck showed left ICA 60 to 70% stenosis -Neurology recommends avoiding dehydration and recommending blood pressure monitoring at home -Neurology also recommends outpatient follow-up with vascular surgery given his evidence of finding a CTA neck -Continue with aspirin and Plavix as he remains off of Eliquis -Right Leg Weakness improved   COVID-19 disease -Was not symptomatic but is having some diarrhea that could be attributed to COVID -Check inflammatory markers and trend daily -Patient has had both vaccines and booster  Recent Labs    11/22/20 0542 11/23/20 0428 11/24/20 0514  DDIMER 0.71* 0.40 0.35  FERRITIN 148 109 125  LDH 163 133 144  CRP 5.2* 7.7* 7.8*   -Inflammatory Markers are stable currently Lab Results  Component Value Date   SARSCOV2NAA POSITIVE (A) 11/20/2020   [place] NEGATIVE 11/13/2020   [place] NEGATIVE 11/24/2018   -SpO2: 95 % on ROOM AIR -Currently not symptomatic but patient's daughter does state that he does cough with his food and has  had some intermittent wheezing -Check chest x-ray and showed "Cardiomediastinal silhouette unchanged in size and contour. Surgical changes of median  sternotomy and CABG. Calcifications of the aortic arch. Similar appearance of low lung volumes with predominantly reticular  opacities of the lungs. No pneumothorax. No large pleural effusion. Similar appearance of hazy opacity at the left costophrenic angle." -Will add Flutter Valve an Incentive Spirometry  -Start Remdesivir Course -Airborne and contact precaution -Zinc and vitamin C -We will add Combivent 1 puff every 6 as needed  AKI Acute Urinary Rentention -Creatinine on admission was 1.3 -Patient's BUN/Cr has been worsening and went from 14/0.87 -> 19/1.07 -> 30/1.49 -> 29/1.11 -We will work-up the patient for further evaluation and give the patient 500 mL bolus and start him on maintenance IV fluid hydration -Check urine osmolality, urine sodium, urine creatinine-still pending to be done -Urinalysis unremarkable but did show 21-50 RBCs per high-power field with small hemoglobin on the urine dipstick and 0-5 WBCs -Urine culture is currently pending -Renal ultrasound done and was a normal renal ultrasound -Avoid further nephrotoxic medications, contrast dyes, hypotension and renally dose medications -Continue to monitor and trend renal function carefully repeat CMP in a.m. -Monitor urine output.  Losartan on hold.   -Patient was in and out cath given that he was having acute urinary retention -Continue monitoring bladder scans  Fever and Leukocytosis improved -Patient Meets 2 SIRS Criteria but does not appear Septic and denies any burning of discomfort and Denies SOB -WBC went from 5.4 -> 10.6 and then trended up to 10.7 today -Was Febrile the night before last with a Tmax of 101.2 -Will give a 500 mL bolus the day before yesterday and will give another 500 mL bolus yesterday and start on maintenance IV fluid a75 MLS per hour given his renal dysfunction -Check Blood Cx 2, Urinalysis and Urine Cx, and CXR yesterday AM showed "The heart size and mediastinal contours are stable. Mild interstitial opacities are identified in bilateral lungs unchanged. There is probable minimal left pleural effusion. The  visualized skeletal structures are stable." -Repeat chest x-ray this morning shows "Lung volumes are small, but are symmetric and are stable since prior examination. Mild left basilar atelectasis. No pneumothorax or pleural effusion. Coronary artery bypass grafting has been performed. Cardiac size is within normal limits when accounting for poor pulmonary insufflation. Vascular crowding is noted at the hila secondary to poor pulmonary insufflation. No acute bone abnormality." -Urinalysis and urine culture as above -Check Procalcitonin Level and was less than 0.10 and repeat was 0.14 but then significantly elevated to 28.5 but I felt this is a spurious result so repeated and was less than 0.10, Lactic Acid Level was elevated 2.7 and then trended up to 2.8 so was given IVF as above -Obtain Cx's as above and if necessary will place on Empiric Abx  -Continue monitor temperature curve and WBC and WBC is improved and he is no longer febrile  Acute CVA likely incidental -Heard CT done and showed No acute intracranial abnormality -EEG showed  "This EEG was recorded in the drowsy and sleeping state, the lack of waking state limits the ability to assess for encephalopathy. There was no seizure or seizure predisposition recorded on this study. Please note that lack of epileptiform activity on EEG does not preclude the possibility of epilepsy." -MRI showed acute infarct and read it as "Two punctate foci of acute ischemia within the subcortical right frontal lobe, 1 of which is along the right precentral gyrus. No  hemorrhage or mass effect. Advanced white matter disease most consistent with chronic small vessel ischemia." -MRA showed "Normal intracranial MRA." -Patient came in with right-sided weakness which resolved  -Patient has prior history of CVA with residual left-sided hemiplegia.  -Also was noted to have slurred speech.   -Echocardiogram as below.  It showed normal systolic function.  Grade 1 diastolic  dysfunction.  Mild LVH was noted.  -Patient was initially continued on Eliquis and aspirin.  After discussions between patient and family and neurology and review of his previous studies patient has been transitioned to aspirin and Plavix. -Noted to be on atorvastatin as well.  LDL noted to be 52.  HbA1c 7.8.   -CT angiogram head and neck does 60 presents to notices left ICA and 60% vertebral artery stenosis.  Neurology recommends follow-up with vascular surgery in the outpatient setting.   -Patient also underwent EEG which did not show any epileptiform activity -Seen by PT and OT and SLP.  Inpatient rehabilitation was recommended but his insurance authorization has been denied technology we will pursue SNF and awaiting Insurance Authorization and bed availability and he was also to be discharged today however on repeat COVID testing he tested positive so we will need to be isolated for 10 days hard to be discharging to SNF  History of paroxysmal atrial fibrillation -Recently started on Eliquis by his cardiologist at the [facility]. however apparently patient's 30-day monitor did not show any evidence for atrial fibrillation.  -Initial episode of atrial fibrillation was in the setting of CABG in the postoperative period.  -Neurology feels patient would be better served by being on dual antiplatelet treatment.   -So he has been changed over to aspirin and Plavix after discussions between neurology and the patient and patient's daughter.   -Metoprolol was currently on hold for permissive hypertension but resumed at a lower dose -Blood pressure is relatively stable 120/67   Left lower extremity edema -Dopplers negative for DVT.  Most likely dependent edema.  Essential Hypertension -Permissive hypertension was allowed.  Losartan and metoprolol were placed on hold.  Blood pressure remains low normal.  -Metoprolol resumed primarily for A. fib at a lower dose.  Continue to hold losartan.   -Continue to monitor  blood pressures per protocol last blood pressure reading was 124/64  Diabetes mellitus type 2, insulin-dependent -Noted to be on Toujeo at home.  Currently on glargine insulin.  -HbA1c is 7.8.   -Dose of glargine was adjusted slightly the day beforeyesterday.  -CBG noted to be 91 yesterday morning.   -We will see how the trends are during the rest of the day and may need to cut back if it remains on the lower side.   -Home medication list shows that he is also supposed to be on -Jardiance and Tradjenta prior to admission.  These are currently on hold.   -We will add NovoLog 3 units 3 times daily with meals if he eats greater than 50% of his meals -CBG's ranging from 82-1 87  Hypomagnesemia -Patient's Mag Level was 1.7 -Replete with IV Mag Sulfate 2 Grams again  -Continue to Monitor and Replete as Necessary -Repeat Mag Level in the the AM   Diarrhea -In the setting of Stool Softeners initially but they have been stopped and now in the setting of likely COVID-19 disease -Now the patient is febrile and has a leukocytosis so if he continues to have diarrhea will order infectious diarrhea work-up -Continue to Monitor and Trend   Abnormal LFTs -  Worsening slightly and AST went from 59 trended up to 70 and it is now trended down to 37 yesterday and today it is 27 -ALT went from 33 is now 50 is now trended down to 34 yesterday and today it is 26 -We will consider holding his statin if it continues to elevate -If hepatic function continues to worsen or not improve we will obtain a right upper quadrant ultrasound as well as an acute hepatitis panel -Repeat CMP in a.m.  Normocytic Anemia -Patient's Hgb/Hct is relatively stable but slowly dropping as it has gone from 12.7/37.5 -> 12.2/35.6 -> 11.7/34.4 -> 12.7/37.7 -> 11.6/34.6 -> 12.2/35.6 -Check Anemia Panel in the AM -Continue to Monitor for S/Sx of Bleeding; No overt bleeding noted -Repeat CBC in the AM   History of Coronary Artery Disease  status post CABG -Stable. Continue aspirin, statin, Zetia and low dose Metoprolol   History of Seizure Disorder -Continue Divalproex 500 mg po qHS.  Hyperlipidemia -Continue Atorvastatin 80 mg po Daily and Zetia.  May hold statin if LFTs continue to worsen but now that they have improved we will continue  History of Depression and Anxiety -Continue Esctialopram 10 mg po Daily and Doxepin 25 mg qHS  DVT prophylaxis: Enoxparin 40 mg sq q24h Code Status: FULL CODE  Family Communication: Discussed with family at bedside Disposition Plan: SNF   Status is: Inpatient  Remains inpatient appropriate because:Unsafe d/c plan, IV treatments appropriate due to intensity of illness or inability to take PO, and Inpatient level of care appropriate due to severity of illness  Dispo:  Patient From: Home  Planned Disposition: [place]  Medically stable for discharge: Yes  Consultants:  Neurology  Procedures:  ECHOCARDIOGRAM IMPRESSIONS     1. Left ventricular ejection fraction, by estimation, is 60 to 65%. The  left ventricle has normal function. The left ventricle has no regional  wall motion abnormalities. There is mild left ventricular hypertrophy.  Left ventricular diastolic parameters  are consistent with Grade I diastolic dysfunction (impaired relaxation).   2. Right ventricular systolic function is normal. The right ventricular  size is normal. There is normal pulmonary artery systolic pressure.   3. Left atrial size was moderately dilated.   4. The mitral valve is normal in structure. No evidence of mitral valve  regurgitation. No evidence of mitral stenosis.   5. The aortic valve is normal in structure. Aortic valve regurgitation is  trivial. Mild to moderate aortic valve sclerosis/calcification is present,  without any evidence of aortic stenosis.   6. The inferior vena cava is normal in size with greater than 50%  respiratory variability, suggesting right  atrial pressure of 3 mmHg.   Conclusion(s)/Recommendation(s): No intracardiac source of embolism  detected on this transthoracic study. A transesophageal echocardiogram is  recommended to exclude cardiac source of embolism if clinically indicated.   FINDINGS   Left Ventricle: Left ventricular ejection fraction, by estimation, is 60  to 65%. The left ventricle has normal function. The left ventricle has no  regional wall motion abnormalities. The left ventricular internal cavity  size was normal in size. There is   mild left ventricular hypertrophy. Left ventricular diastolic parameters  are consistent with Grade I diastolic dysfunction (impaired relaxation).   Right Ventricle: The right ventricular size is normal. No increase in  right ventricular wall thickness. Right ventricular systolic function is  normal. There is normal pulmonary artery systolic pressure. The tricuspid  regurgitant velocity is 2.55 m/s, and   with an  assumed right atrial pressure of 3 mmHg, the estimated right  ventricular systolic pressure is 0000000 mmHg.   Left Atrium: Left atrial size was moderately dilated.   Right Atrium: Right atrial size was normal in size.   Pericardium: There is no evidence of pericardial effusion.   Mitral Valve: The mitral valve is normal in structure. No evidence of  mitral valve regurgitation. No evidence of mitral valve stenosis.   Tricuspid Valve: The tricuspid valve is normal in structure. Tricuspid  valve regurgitation is not demonstrated. No evidence of tricuspid  stenosis.   Aortic Valve: The aortic valve is normal in structure. Aortic valve  regurgitation is trivial. Mild to moderate aortic valve  sclerosis/calcification is present, without any evidence of aortic  stenosis. Aortic valve mean gradient measures 6.0 mmHg. Aortic  valve peak gradient measures 11.4 mmHg. Aortic valve area, by VTI measures  1.58 cm.   Pulmonic Valve: The pulmonic valve was normal in  structure. Pulmonic valve  regurgitation is not visualized. No evidence of pulmonic stenosis.   Aorta: The aortic root is normal in size and structure.   Venous: The inferior vena cava is normal in size with greater than 50%  respiratory variability, suggesting right atrial pressure of 3 mmHg.   IAS/Shunts: No atrial level shunt detected by color flow Doppler.      LEFT VENTRICLE  PLAX 2D  LVIDd:         3.80 cm     Diastology  LVIDs:         2.60 cm     LV e' medial:    6.20 cm/s  LV PW:         1.20 cm     LV E/e' medial:  15.8  LV IVS:        1.10 cm     LV e' lateral:   8.05 cm/s  LVOT diam:     2.00 cm     LV E/e' lateral: 12.2  LV SV:         62  LV SV Index:   35  LVOT Area:     3.14 cm     LV Volumes (MOD)  LV vol d, MOD A2C: 64.0 ml  LV vol d, MOD A4C: 77.4 ml  LV vol s, MOD A2C: 25.8 ml  LV vol s, MOD A4C: 30.0 ml  LV SV MOD A2C:     38.2 ml  LV SV MOD A4C:     77.4 ml  LV SV MOD BP:      44.4 ml   RIGHT VENTRICLE  RV Basal diam:  3.15 cm  RV Mid diam:    3.80 cm  RV S prime:     8.39 cm/s  TAPSE (M-mode): 1.7 cm   LEFT ATRIUM             Index       RIGHT ATRIUM           Index  LA diam:        4.90 cm 2.75 cm/m  RA Area:     11.90 cm  LA Vol (A2C):   56.0 ml 31.43 ml/m RA Volume:   21.40 ml  12.01 ml/m  LA Vol (A4C):   57.5 ml 32.27 ml/m  LA Biplane Vol: 58.6 ml 32.89 ml/m   AORTIC VALVE  AV Area (Vmax):    1.59 cm  AV Area (Vmean):   1.56 cm  AV Area (VTI):  1.58 cm  AV Vmax:           169.00 cm/s  AV Vmean:          115.000 cm/s  AV VTI:            0.392 m  AV Peak Grad:      11.4 mmHg  AV Mean Grad:      6.0 mmHg  LVOT Vmax:         85.50 cm/s  LVOT Vmean:        57.100 cm/s  LVOT VTI:          0.197 m  LVOT/AV VTI ratio: 0.50   MITRAL VALVE                TRICUSPID VALVE  MV Area (PHT): 3.37 cm     TR Peak grad:   26.0 mmHg  MV Decel Time: 225 msec     TR Vmax:        255.00 cm/s  MV E velocity: 98.10 cm/s  MV A velocity:  118.00 cm/s  SHUNTS  MV E/A ratio:  0.83         Systemic VTI:  0.20 m                              Systemic Diam: 2.00 cm   Antimicrobials:  Anti-infectives (From admission, onward)    Start     Dose/Rate Route Frequency Ordered Stop   11/21/20 1000  remdesivir 100 mg in sodium chloride 0.9 % 100 mL IVPB       See Hyperspace for full Linked Orders Report.   100 mg 200 mL/hr over 30 Minutes Intravenous Daily 11/20/20 1520 11/24/20 0939   11/20/20 1615  remdesivir 200 mg in sodium chloride 0.9% 250 mL IVPB       See Hyperspace for full Linked Orders Report.   200 mg 580 mL/hr over 30 Minutes Intravenous Once 11/20/20 1520 11/20/20 1834        Subjective: Seen and examined at bedside and he is in no acute distress was resting.  Denies any pain.  Will confirm asleep.  No family currently at bedside.  Denies any complaints and no shortness of breath or burning or discomfort in his urine.  Objective: Vitals:   11/23/20 0851 11/23/20 1527 11/23/20 2107 11/24/20 0854  BP: (!) 160/75 120/67 97/78 124/64  Pulse: 76 75 74 77  Resp:  16 16   Temp:  98.3 F (36.8 C) 98 F (36.7 C)   TempSrc:  Oral Oral   SpO2:  94% 95%   Weight:      Height:        Intake/Output Summary (Last 24 hours) at 11/24/2020 1550 Last data filed at 11/24/2020 [id number] Gross per 24 hour  Intake 1182.62 ml  Output 400 ml  Net 782.62 ml    Filed Weights   11/13/20 1900 11/13/20 2043  Weight: 82.6 kg 74.8 kg   Examination: Physical Exam:  Constitutional: WN/WD overweight Caucasian male currently no acute distress appears calm sleeping and awoken from his sleep Eyes: Lids and conjunctivae normal, sclerae anicteric  ENMT: External Ears, Nose appear normal. Grossly normal hearing.  Neck: Appears normal, supple, no cervical masses, normal ROM, no appreciable thyromegaly; no Appreciable JVD Respiratory: Diminished to auscultation bilaterally, no wheezing, rales, rhonchi or crackles. Normal respiratory effort and  patient is not tachypenic. No accessory muscle use. Unlabored breathing  Cardiovascular: RRR, no murmurs / rubs / gallops. S1 and S2 auscultated.  Some mild LE edema  Abdomen: Soft, non-tender, non-distended. No masses palpated. No appreciable hepatosplenomegaly. Bowel sounds positive.  GU: Deferred. Musculoskeletal: No clubbing / cyanosis of digits/nails. No joint deformity upper and lower extremities but has a left arm contracture and left-sided hemiplegia Skin: No rashes, lesions, ulcers on a limited skin evaluation. No induration; Warm and dry.  Neurologic: CN 2-12 grossly intact with no focal deficits. Romberg sign and cerebellar reflexes not assessed.  Psychiatric: Normal judgment and insight. Alert and oriented x 3. Normal mood and appropriate affect.   Data Reviewed: I have personally reviewed following labs and imaging studies  CBC: Recent Labs  Lab 11/20/20 0135 11/21/20 0201 11/22/20 0542 11/23/20 0428 11/24/20 0514  WBC 7.2 5.4 10.6* 10.7* 8.1  NEUTROABS 3.6 3.6 6.5 5.9 4.2  HGB 11.7* 12.7* 14.1 11.6* 12.2*  HCT 34.8* 37.7* 41.3 34.6* 35.6*  MCV 95.3 95.7 96.5 95.6 95.2  PLT 222 198 241 208 [id number]    Basic Metabolic Panel: Recent Labs  Lab 11/20/20 0135 11/21/20 0201 11/22/20 0542 11/23/20 0428 11/24/20 0514  NA 139 137 137 135 137  K 4.0 4.1 3.7 3.6 3.6  CL 109 102 100 102 105  CO2 '25 25 26 25 23  '$ GLUCOSE 130* 145* 95 176* 102*  BUN '17 14 19 '$ 30* 29*  CREATININE 1.13 0.87 1.07 1.49* 1.11  CALCIUM 8.5* 8.8* 8.9 8.1* 8.2*  MG 1.8 1.7 1.7 1.8 1.7  PHOS 3.3 2.8 4.1 3.9 2.7    GFR: Estimated Creatinine Clearance: 45.7 mL/min (by C-G formula based on SCr of 1.11 mg/dL). Liver Function Tests: Recent Labs  Lab 11/20/20 0135 11/21/20 0201 11/22/20 0542 11/23/20 0428 11/24/20 0514  AST 16 50* 70* 37 27  ALT 17 33 50* 34 26  ALKPHOS 45 50 49 45 41  BILITOT 0.4 0.6 0.7 0.6 0.6  PROT 5.6* 6.3* 6.8 5.4* 5.4*  ALBUMIN 2.4* 2.7* 2.9* 2.2* 2.3*    No results  for input(s): LIPASE, AMYLASE in the last 168 hours. No results for input(s): AMMONIA in the last 168 hours. Coagulation Profile: No results for input(s): INR, PROTIME in the last 168 hours.  Cardiac Enzymes: No results for input(s): CKTOTAL, CKMB, CKMBINDEX, TROPONINI in the last 168 hours. BNP (last 3 results) No results for input(s): PROBNP in the last 8760 hours. HbA1C: No results for input(s): HGBA1C in the last 72 hours. CBG: Recent Labs  Lab 11/23/20 1146 11/23/20 1525 11/23/20 2106 11/24/20 0816 11/24/20 1203  GLUCAP 133* 138* 187* 82 103*    Lipid Profile: No results for input(s): CHOL, HDL, LDLCALC, TRIG, CHOLHDL, LDLDIRECT in the last 72 hours. Thyroid Function Tests: No results for input(s): TSH, T4TOTAL, FREET4, T3FREE, THYROIDAB in the last 72 hours. Anemia Panel: Recent Labs    11/23/20 0428 11/24/20 0514  FERRITIN 109 125    Sepsis Labs: Recent Labs  Lab 11/22/20 1711 11/22/20 1858 11/23/20 0428 11/24/20 0514 11/24/20 0840  PROCALCITON <0.10  --  0.14 28.50 <0.10  LATICACIDVEN 2.7* 2.8*  --   --   --      Recent Results (from the past 240 hour(s))  Resp Panel by RT-PCR (Flu A&B, Covid) Nasopharyngeal Swab     Status: Abnormal   Collection Time: 11/20/20  9:06 AM   Specimen: Nasopharyngeal Swab; Nasopharyngeal(NP) swabs in vial transport medium  Result Value Ref Range Status   SARS Coronavirus 2 by RT PCR POSITIVE (A) NEGATIVE  Final    Comment: RESULT CALLED TO, READ BACK BY AND VERIFIED WITH: R,CROGHAN '@1111'$  11/20/20 EB (NOTE) SARS-CoV-2 target nucleic acids are DETECTED.  The SARS-CoV-2 RNA is generally detectable in upper respiratory specimens during the acute phase of infection. Positive results are indicative of the presence of the identified virus, but do not rule out bacterial infection or co-infection with other pathogens not detected by the test. Clinical correlation with patient history and other diagnostic information is necessary  to determine patient infection status. The expected result is Negative.  Fact Sheet for Patients: EntrepreneurPulse.com.au  Fact Sheet for Healthcare Providers: IncredibleEmployment.be  This test is not yet approved or cleared by the [place] FDA and  has been authorized for detection and/or diagnosis of SARS-CoV-2 by FDA under an Emergency Use Authorization (EUA).  This EUA will remain in effect (meaning this test can be used) f or the duration of  the COVID-19 declaration under Section 564(b)(1) of the Act, 21 U.S.C. section 360bbb-3(b)(1), unless the authorization is terminated or revoked sooner.     Influenza A by PCR NEGATIVE NEGATIVE Final   Influenza B by PCR NEGATIVE NEGATIVE Final    Comment: (NOTE) The Xpert Xpress SARS-CoV-2/FLU/RSV plus assay is intended as an aid in the diagnosis of influenza from Nasopharyngeal swab specimens and should not be used as a sole basis for treatment. Nasal washings and aspirates are unacceptable for Xpert Xpress SARS-CoV-2/FLU/RSV testing.  Fact Sheet for Patients: EntrepreneurPulse.com.au  Fact Sheet for Healthcare Providers: IncredibleEmployment.be  This test is not yet approved or cleared by the [place] FDA and has been authorized for detection and/or diagnosis of SARS-CoV-2 by FDA under an Emergency Use Authorization (EUA). This EUA will remain in effect (meaning this test can be used) for the duration of the COVID-19 declaration under Section 564(b)(1) of the Act, 21 U.S.C. section 360bbb-3(b)(1), unless the authorization is terminated or revoked.  Performed at [facility], [place] [address], [place], [place] [address]   Culture, blood (routine x 2)     Status: None (Preliminary result)   Collection Time: 11/22/20  4:45 PM   Specimen: BLOOD RIGHT HAND  Result Value Ref Range Status   Specimen Description BLOOD RIGHT HAND  Final   Special  Requests   Final    BOTTLES DRAWN AEROBIC ONLY Blood Culture results may not be optimal due to an inadequate volume of blood received in culture bottles   Culture   Final    NO GROWTH 2 DAYS Performed at [facility], [place] [address] [place], [place] [address]    Report Status PENDING  Incomplete  Culture, blood (routine x 2)     Status: None (Preliminary result)   Collection Time: 11/22/20  5:11 PM   Specimen: BLOOD RIGHT ARM  Result Value Ref Range Status   Specimen Description BLOOD RIGHT ARM  Final   Special Requests   Final    BOTTLES DRAWN AEROBIC AND ANAEROBIC Blood Culture results may not be optimal due to an inadequate volume of blood received in culture bottles   Culture   Final    NO GROWTH 2 DAYS Performed at [facility], [place] [address] [place], [place] [address]    Report Status PENDING  Incomplete   RN Pressure Injury Documentation:     Estimated body mass index is 29.23 kg/m as calculated from the following:   Height as of this encounter: '5\' 3"'$  (1.6 m).   Weight as of this  encounter: 74.8 kg.  Malnutrition Type:   Malnutrition Characteristics:  Nutrition Interventions:     Radiology Studies: US RENAL  Result Date: 11/23/2020 CLINICAL DATA:  Acute kidney injury. EXAM: RENAL / URINARY TRACT ULTRASOUND COMPLETE COMPARISON:  None. FINDINGS: Right Kidney: Renal measurements: 9.3 cm x 4.4 cm x 5.4 cm = volume: 115.9 mL. Echogenicity within normal limits. No mass or hydronephrosis visualized. Left Kidney: Renal measurements: 10.4 cm x 5.5 cm x 5.6 cm = volume: 168.8 mL. Echogenicity within normal limits. No mass or hydronephrosis visualized. Bladder: Appears normal for degree of bladder distention. Other: None. IMPRESSION: Normal renal ultrasound. Electronically Signed   By: Virgina Norfolk M.D.   On: 11/23/2020 19:57   DG CHEST PORT 1 VIEW  Result Date: 11/24/2020 CLINICAL DATA:  Shortness of breath EXAM: PORTABLE CHEST 1 VIEW COMPARISON:  11/23/2020  FINDINGS: Low lung volumes. Mild atelectasis at the left lung base. Chronic interstitial changes. Similar cardiomediastinal contours. IMPRESSION: No acute process.  Stable lung aeration. Electronically Signed   By: Macy Mis M.D.   On: 11/24/2020 09:25   DG CHEST PORT 1 VIEW  Result Date: 11/23/2020 CLINICAL DATA:  Dyspnea, atrial fibrillation EXAM: PORTABLE CHEST 1 VIEW COMPARISON:  11/22/2020 FINDINGS: Lung volumes are small, but are symmetric and are stable since prior examination. Mild left basilar atelectasis. No pneumothorax or pleural effusion. Coronary artery bypass grafting has been performed. Cardiac size is within normal limits when accounting for poor pulmonary insufflation. Vascular crowding is noted at the hila secondary to poor pulmonary insufflation. No acute bone abnormality. IMPRESSION: Stable pulmonary hypoinflation. Electronically Signed   By: Fidela Salisbury M.D.   On: 11/23/2020 09:24    Scheduled Meds:   stroke: mapping our early stages of recovery book   Does not apply Once   allopurinol  300 mg Oral Daily   vitamin C  500 mg Oral Daily   aspirin EC  81 mg Oral Daily   atorvastatin  80 mg Oral Daily   baclofen  10 mg Oral BID   clopidogrel  75 mg Oral Daily   divalproex  500 mg Oral QHS   doxepin  25 mg Oral QHS   enoxaparin (LOVENOX) injection  40 mg Subcutaneous Q24H   escitalopram  10 mg Oral Daily   ezetimibe  10 mg Oral Daily   insulin aspart  0-9 Units Subcutaneous TID WC   insulin aspart  3 Units Subcutaneous TID WC   insulin glargine-yfgn  24 Units Subcutaneous QHS   metoprolol succinate  12.5 mg Oral Daily   zinc sulfate  220 mg Oral Daily   Continuous Infusions:  sodium chloride 75 mL/hr at 11/24/20 0648    LOS: 10 days   Kerney Elbe, DO [facility] PAGER is on AMION  If 7PM-7AM, please contact night-coverage [email or web address]

## 2020-11-24 NOTE — Therapy (Signed)
Occupational Therapy Note: Per interdisciplinary communication with Rehab team, have updated discharge recommendations to SNF. Please see OT Progress Note from 11/23/2020.   Anderson [place], [place] Office: [phone] 11/24/2020

## 2020-11-24 NOTE — Progress Notes (Addendum)
Physical Therapy Treatment Patient Details Name: Fred [name] MRN: [id number] DOB: 01-Jun-1935 Today's Date: 11/24/2020   History of Present Illness Pt is an 85 y/o male admitted 9/2 secondary to new onset R weakness. Found to have R frontal lobe infarct. 9/3 rapid response contacted for confusion and disorientation. 9/9 COVID (+).  PMH includes HTN, DM, seizures, a fib, CVA with L sided weakness, CHF, and CAD.    PT Comments    Pt tolerated treatment well and is motivated to participate in PT. Pt showed great difficulty maintaining upright position while seated EOB, demonstrating a significant R lateral lean. Pt performed transfers with bil knee block and +2 physical assist with difficulty clearing buttock. As pt showing decline in functional status, continue to recommend SNF with frequency updated to 2x/week. Will update goals based on current functional status.   Recommendations for follow up therapy are one component of a multi-disciplinary discharge planning process, led by the attending physician.  Recommendations may be updated based on patient status, additional functional criteria and insurance authorization.  Follow Up Recommendations  SNF     Equipment Recommendations  None recommended by PT    Recommendations for Other Services       Precautions / Restrictions Precautions Precautions: Fall Precaution Comments: left hemiplegia Restrictions Weight Bearing Restrictions: No     Mobility  Bed Mobility Overal bed mobility: Needs Assistance Bed Mobility: Rolling;Sidelying to Sit Rolling: Mod assist Sidelying to sit: Mod assist;+2 for physical assistance       General bed mobility comments: cues for sequence with assist to roll and rise from left side with use of rail, increased time and +2 assist    Transfers Overall transfer level: Needs assistance   Transfers: Lateral/Scoot Transfers;Sit to/from Stand Sit to Stand: Max assist;+2 physical assistance         Lateral/Scoot Transfers: +2 physical assistance;Mod assist General transfer comment: max +2 to stand from bed and from recliner x 2 with LLE blocked, use of belt and pad. Lateral scoot pivot with drop arm toward right mod +2  Ambulation/Gait             General Gait Details: Non-ambulatory at baseline   Stairs             Wheelchair Mobility    Modified Rankin (Stroke Patients Only) Modified Rankin (Stroke Patients Only) Pre-Morbid Rankin Score: Moderately severe disability Modified Rankin: Severe disability     Balance   Sitting-balance support: Single extremity supported;Feet supported Sitting balance-Leahy Scale: Poor Sitting balance - Comments: RUE support for balance EOb with tendency for right posterior lean   Standing balance support: Bilateral upper extremity supported Standing balance-Leahy Scale: Zero Standing balance comment: bil UE support, bil knee block and assist to stand                            Cognition Arousal/Alertness: Awake/alert Behavior During Therapy: WFL for tasks assessed/performed Overall Cognitive Status: Impaired/Different from baseline Area of Impairment: Safety/judgement                         Safety/Judgement: Decreased awareness of safety;Decreased awareness of deficits     General Comments: decreased awareness of safety and function      Exercises General Exercises - Upper Extremity Shoulder Flexion: Right;10 reps;Seated;AROM Elbow Flexion: Strengthening;Right;10 reps;Seated General Exercises - Lower Extremity Long Arc Quad: Right;10 reps;Seated;AROM Hip Flexion/Marching: Right;10 reps;Seated;AAROM    General  Comments        Pertinent Vitals/Pain Pain Assessment: No/denies pain    Home Living                      Prior Function            PT Goals (current goals can now be found in the care plan section) Acute Rehab PT Goals Time For Goal Achievement: 12/08/20 Potential to  Achieve Goals: Fair Progress towards PT goals: Not progressing toward goals - comment;Goals downgraded-see care plan (Pt demonstrating decreased strength and mobility by showing great difficulty with transfers. Also demonstrates significant R lateral lean that wasn't present upon initial eval.)    Frequency    Min 2X/week      PT Plan Current plan remains appropriate;Frequency needs to be updated    Co-evaluation              AM-PAC PT "6 Clicks" Mobility   Outcome Measure  Help needed turning from your back to your side while in a flat bed without using bedrails?: A Lot Help needed moving from lying on your back to sitting on the side of a flat bed without using bedrails?: Total Help needed moving to and from a bed to a chair (including a wheelchair)?: Total Help needed standing up from a chair using your arms (e.g., wheelchair or bedside chair)?: Total Help needed to walk in hospital room?: Total Help needed climbing 3-5 steps with a railing? : Total 6 Click Score: 7    End of Session Equipment Utilized During Treatment: Gait belt Activity Tolerance: Patient tolerated treatment well Patient left: in chair;with call bell/phone within reach;with chair alarm set Nurse Communication: Mobility status (NT notified of +2 lateral transfer with drop arm) PT Visit Diagnosis: Muscle weakness (generalized) (M62.81);Other abnormalities of gait and mobility (R26.89);Unsteadiness on feet (R26.81)     Time: 1233-1310 PT Time Calculation (min) (ACUTE ONLY): 37 min  Charges:  $Therapeutic Exercise: 8-22 mins $Therapeutic Activity: 8-22 mins                     Louie Casa, SPT Acute Rehab: [phone] [id number]    Domingo Dimes 11/24/2020, 1:48 PM

## 2020-11-25 DIAGNOSIS — I639 Cerebral infarction, unspecified: Secondary | ICD-10-CM | POA: Diagnosis not present

## 2020-11-25 LAB — CBC WITH DIFFERENTIAL/PLATELET
Abs Immature Granulocytes: 0.07 10*3/uL (ref 0.00–0.07)
Basophils Absolute: 0 10*3/uL (ref 0.0–0.1)
Basophils Relative: 1 %
Eosinophils Absolute: 0.4 10*3/uL (ref 0.0–0.5)
Eosinophils Relative: 6 %
HCT: 34.5 % — ABNORMAL LOW (ref 39.0–52.0)
Hemoglobin: 11.5 g/dL — ABNORMAL LOW (ref 13.0–17.0)
Immature Granulocytes: 1 %
Lymphocytes Relative: 39 %
Lymphs Abs: 2.4 10*3/uL (ref 0.7–4.0)
MCH: 32.1 pg (ref 26.0–34.0)
MCHC: 33.3 g/dL (ref 30.0–36.0)
MCV: 96.4 fL (ref 80.0–100.0)
Monocytes Absolute: 0.7 10*3/uL (ref 0.1–1.0)
Monocytes Relative: 11 %
Neutro Abs: 2.6 10*3/uL (ref 1.7–7.7)
Neutrophils Relative %: 42 %
Platelets: 228 10*3/uL (ref 150–400)
RBC: 3.58 MIL/uL — ABNORMAL LOW (ref 4.22–5.81)
RDW: 14.5 % (ref 11.5–15.5)
WBC: 6.1 10*3/uL (ref 4.0–10.5)
nRBC: 0 % (ref 0.0–0.2)

## 2020-11-25 LAB — COMPREHENSIVE METABOLIC PANEL
ALT: 24 U/L (ref 0–44)
AST: 25 U/L (ref 15–41)
Albumin: 2.2 g/dL — ABNORMAL LOW (ref 3.5–5.0)
Alkaline Phosphatase: 39 U/L (ref 38–126)
Anion gap: 5 (ref 5–15)
BUN: 27 mg/dL — ABNORMAL HIGH (ref 8–23)
CO2: 24 mmol/L (ref 22–32)
Calcium: 8 mg/dL — ABNORMAL LOW (ref 8.9–10.3)
Chloride: 109 mmol/L (ref 98–111)
Creatinine, Ser: 0.97 mg/dL (ref 0.61–1.24)
GFR, Estimated: >60 mL/min (ref >60)
Glucose, Bld: 127 mg/dL — ABNORMAL HIGH (ref 70–99)
Potassium: 3.9 mmol/L (ref 3.5–5.1)
Sodium: 138 mmol/L (ref 135–145)
Total Bilirubin: 0.6 mg/dL (ref 0.3–1.2)
Total Protein: 5.3 g/dL — ABNORMAL LOW (ref 6.5–8.1)

## 2020-11-25 LAB — URINE CULTURE: Culture: <10000 — AB

## 2020-11-25 LAB — PROCALCITONIN: Procalcitonin: <0.1 ng/mL

## 2020-11-25 LAB — D-DIMER, QUANTITATIVE: D-Dimer, Quant: 0.32 ug/mL-FEU (ref 0.00–0.50)

## 2020-11-25 LAB — GLUCOSE, CAPILLARY
Glucose-Capillary: 80 mg/dL (ref 70–99)
Glucose-Capillary: 107 mg/dL — ABNORMAL HIGH (ref 70–99)
Glucose-Capillary: 85 mg/dL (ref 70–99)
Glucose-Capillary: 145 mg/dL — ABNORMAL HIGH (ref 70–99)

## 2020-11-25 LAB — SEDIMENTATION RATE: Sed Rate: 22 mm/hr — ABNORMAL HIGH (ref 0–16)

## 2020-11-25 LAB — LACTATE DEHYDROGENASE: LDH: 130 U/L (ref 98–192)

## 2020-11-25 LAB — FERRITIN: Ferritin: 133 ng/mL (ref 24–336)

## 2020-11-25 LAB — MAGNESIUM: Magnesium: 2 mg/dL (ref 1.7–2.4)

## 2020-11-25 LAB — C-REACTIVE PROTEIN: CRP: 4.9 mg/dL — ABNORMAL HIGH (ref <1.0)

## 2020-11-25 LAB — PHOSPHORUS: Phosphorus: 2.3 mg/dL — ABNORMAL LOW (ref 2.5–4.6)

## 2020-11-25 LAB — FIBRINOGEN: Fibrinogen: 406 mg/dL (ref 210–475)

## 2020-11-25 NOTE — TOC Progression Note (Signed)
Transition of Care [facility]) - Progression Note    Patient Details  Name: Fred [name] MRN: [id number] Date of Birth: 09/20/1935  Transition of Care [facility] CM/SW Contact  Joanne Chars, LCSW Phone Number: 11/25/2020, 8:44 AM  Clinical Narrative:   Pt continues to be in covid quarantine.  TOC continues to follow for DC plan to SNF once out of quarantine.     Expected Discharge Plan: [place] Barriers to Discharge: Continued Medical Work up, SNF Pending bed offer  Expected Discharge Plan and Services Expected Discharge Plan: [place] Choice: [place] arrangements for the past 2 months: Single Family Home                                       Social Determinants of Health (SDOH) Interventions    Readmission Risk Interventions No flowsheet data found.

## 2020-11-25 NOTE — Progress Notes (Signed)
  Speech Language Pathology Treatment: Cognitive-Linquistic  Patient Details Name: Fred [name] MRN: [id number] DOB: [birth year] Today's Date: 11/25/2020 Time: 6144-3154 SLP Time Calculation (min) (ACUTE ONLY): 33 min  Assessment / Plan / Recommendation Clinical Impression  Pt was seen for treatment. He was alert and cooperative during the session and he reported that he believes his cognition is back to baseline. Pt was oriented x4 with min cues for date and day. Pt was able to accurately recall various activies from the day, but required min cues for recall of meals consumed. Pt demonstrated 80% accuracy with problem soplving related to safety increasing to 100% with cues. Pt's daughter, Fred [name], was contacted via pt's Portal device in room. Pt's daughter reported that the pt's cognition has improved compared to that demonstrated on admission and that she also believes that the pt is back to baseline in this regard. It was agreed that SLP services will be discontinued at this time, but can be initiated when he returns home if subsequent difficulty is noted. Further acute skilled SLP services are not clinically indicated at this time.    HPI HPI: Pt is an 85 y/o male admitted 9/2 secondary to new onset R weakness. MRI brain: Two punctate foci of acute ischemia within the subcortical right  frontal lobe, 1 of which is along the right precentral gyrus. Advanced white matter disease most consistent with chronic small vessel ischemia. Pt passed [facility] screen on 8/2 and swallow eval ordered on 8/4. PMH: HTN, DM, seizures, a fib, CVA with L sided weakness, CHF, and CAD. MBS 08/27/19: min oropharyngeal dysphagia with oral holding of bolus and decreased organization, swallow trigger at the level of the valleculae with liquids, min reduced tongue base retraction which cleared with dry swallow. No penetration or aspiration observed. A regular texture diet with thin liquids recommended.      SLP Plan  All goals  met;Discharge SLP treatment due to (comment)       Recommendations  Diet recommendations: Regular;Thin liquid Liquids provided via: Cup;Straw Medication Administration: Whole meds with liquid Supervision: Patient able to self feed Postural Changes and/or Swallow Maneuvers: Seated upright 90 degrees                Oral Care Recommendations: Oral care BID Follow up Recommendations: Skilled Nursing facility SLP Visit Diagnosis: Cognitive communication deficit (M08.676) Plan: All goals met;Discharge SLP treatment due to (comment)       Fred [name] I. Hardin Negus, [place], [place] Office number [phone] Pager [place] 11/25/2020, 5:44 PM

## 2020-11-25 NOTE — Progress Notes (Signed)
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  History is supplemented by patient's daughter at bedside.  Patient initially reported right-sided weakness beginning 2 days ago to the emergency provider.  During my conversation he says his right side has been feeling weak from exertion from overuse due to his chronic left-sided hemiplegia.  His family took him to a ball game to watch his grandchildren play earlier today.  While sitting down he was noted to start drifting towards his right as if he was about to fall over.  He then developed slurred incoherent speech with transient confusion.  EMS were called and per daughter's report he was also noted to have pinpoint pupils.   At time of admitting evaluation, patient's daughter feels that slurred speech is improving but still noticeable compared to his baseline speech.  Mental status has also improved.   Patient was previously on dual antiplatelet therapy with aspirin 81 mg and Plavix 75 mg daily.  Per conversation, he was recently taken off of Plavix and started on Eliquis 5 mg twice daily on 10/28/2020 by his cardiologist through the [place].  This was apparently started for A. fib stroke reduction.  Per patient and family, they are only aware of a brief episode of postoperative A. fib at the time of his CABG.  He states that he has not had any recent outpatient cardiac monitoring. Patient denies any personal history of blood clots.   In the ED, BP 132/65, pulse 72, RR 22,  temp 99.3 F, SPO2 95% on room air. Sodium 133, potassium 4.7, bicarb 24, BUN 16, creatinine 1.30, serum glucose 237, LFTs within normal limits.  WBC 9.4, hemoglobin 13.7, platelets 190,000.  SARS-CoV-2 PCR negative.  Influenza negative. CT head without contrast was negative for acute intracranial abnormality.  Chronic ischemic microangiopathy and generalized atrophy noted. MRI brain without contrast showed 2 punctate foci of acute ischemia within the subcortical right frontal lobe, 1 of which is along the right precentral gyrus.  No hemorrhage or mass-effect seen.  Advanced white matter disease most consistent with chronic small vessel ischemia noted. Neurology consulted and recommended medical admission for further stroke work-up.  The hospitalist service was consulted to admit for further management.   Assessment & Plan:   Principal Problem:   Acute CVA (cerebrovascular accident) ([place]) Active Problems:   Hypertension associated with diabetes ([place])   Coronary atherosclerosis   Type 2 diabetes mellitus with complication, with long-term current use of insulin ([place])   AKI (acute kidney injury) ([place])   Hyperlipidemia associated with type 2 diabetes mellitus ([place])   Seizure disorder ([place])   Depression with anxiety   Acute subcortical right frontal lobe ischemic infarcts History of prior CVA with residual left hemiplegia: Patient presenting to the ED following right-sided weakness.  MR brain with acute infarct within subcortical right frontal lobe without hemorrhage/mass-effect.  MRA unrevealing.  CT angio neck with cervical carotid atherosclerosis with 60% narrowing left ICA, 60% bilateral vertebral origin stenosis.  TTE with LVEF 60 to [id number], grade 1 diastolic dysfunction, no aortic stenosis, IVC normal in size.  EEG with  no epileptiform/seizure activity noted.  LDL 52, hemoglobin A1c 7.8 neurology was consulted and followed initially during the hospital course.  Outpatient follow-up 4 weeks with  [facility], Dr. Delice [name]. --Plavix 75 mg daily and aspirin 81 mg  --Atorvastatin 80 mg daily --Continue therapy efforts while inpatient --Pending SNF placement  COVID-19 viral infection: Incidentally found to be COVID-19 positive on 11/20/2020.  Completed 5-day course of remdesivir on 11/24/2020.  Oxygenating well on room air. --Continue zinc, vitamin C --Combivent MDI q6h PRN wheezing/shortness of breath --continue airborne/contact isolation until 11/30/2020  Left ICA stenosis Bilateral vertebral artery stenosis Noted on CTA with 60% narrowing left ICA, 60% bilateral vertebral origin stenosis. --Outpatient follow-up with vascular surgery   Paroxysmal atrial fibrillation: Patient recently started on Eliquis 5 mg twice daily by his [place] cardiologist apparently for A. fib stroke risk reduction.  No prior A. fib episodes reported in our system other than a single episode in the postop setting after his CABG.  Sinus rhythm on admission with controlled rate. --Eliquis discontinued due in favor of dual antiplatelet therapy as above --Metoprolol succinate 12.5 mg p.o. daily   Left lower extremity swelling: Vascular duplex ultrasound negative for DVT, likely dependent edema    Acute kidney injury: Resolved Renal ultrasound with no mass/hydronephrosis, otherwise unrevealing.  Improved with IV fluid hydration. Cr 1.30>>0.97 --Continue to hold home losartan --Avoid nephrotoxins, renally dose all medication  Hypomagnesemia: Resolved Repleted during hospitalization.   Hypertension: Home regimen includes losartan 25 mg p.o. daily, metoprolol succinate 12.5 mg p.o. daily. --Continue metoprolol succinate 12.5 mg p.o. daily --Continue to hold losartan as above.   Insulin-dependent type 2 diabetes: Home regimen includes Toujeo 22 units nightly, Jardiance and linagliptin.  Hemoglobin A1c 1.8 on 11/14/2020. --Semglee 24u [place] qHS --Novolog 3u [place] TIDAC --SSI for further coverage --CBGs qAC/HS    CAD s/p CABG: --Continue aspirin, atorvastatin, Zetia.   Seizure disorder: EEG with no epileptiform/seizure activity. --Continue Depakote 500 mg p.o. nightly.   Hyperlipidemia: --Continue atorvastatin, Zetia.   Depression/anxiety: --Continue doxepin and Lexapro.  Hx Gout --Continue allopurinol 3 mg p.o. daily   DVT prophylaxis: enoxaparin (LOVENOX) injection 40 mg Start: 11/17/20 1200   Code Status: Full Code Family Communication: No family present at bedside this morning  Disposition Plan:  Level of care: Telemetry Medical Status is: Inpatient  Remains inpatient appropriate because:Unsafe d/c plan and Inpatient level of care appropriate due to severity of illness  Dispo:  Patient From: Home  Planned Disposition: [place]  Medically stable for discharge: Yes but awaiting 10 day quarantine/isolation.  Before discharge     Consultants:  Neurology  Procedures:  TTE Vascular duplex ultrasound lower extremity EEG  Antimicrobials:  None   Subjective: Patient seen examined at bedside, resting comfortably.  No complaints this morning.  Continue to await 10-day isolation period before discharging to SNF.  Denies headache, no dizziness, no chest pain, no palpitations, no shortness of breath, no abdominal pain, no weakness, no fatigue, no paresthesias.  No acute events overnight per nursing staff.  Objective: Vitals:   11/24/20 0854 11/24/20 1623 11/24/20 2100 11/25/20 0800  BP: 124/64 122/65 130/69 (!) 155/90  Pulse: 77 66 64 65  Resp:  '17 18 18  '$ Temp:  98.2 F (36.8 C)  98.5 F (36.9 C)  TempSrc:  Oral  Oral  SpO2:  99% 99% 98%  Weight:      Height:        Intake/Output Summary (Last 24 hours) at 11/25/2020 1324 Last  data filed at 11/25/2020 0513 Gross per 24 hour  Intake 181.1 ml  Output 500 ml  Net -318.9 ml   Filed Weights   11/13/20 1900 11/13/20 2043  Weight: 82.6 kg 74.8 kg    Examination:  General exam: Appears calm and  comfortable, elderly in appearance. Respiratory system: Clear to auscultation. Respiratory effort normal. Cardiovascular system: S1 & S2 heard, RRR. No JVD, murmurs, rubs, gallops or clicks.  Trace lower extremity edema bilaterally. Gastrointestinal system: Abdomen is nondistended, soft and nontender. No organomegaly or masses felt. Normal bowel sounds heard. Central nervous system: Alert and oriented. No focal neurological deficits. Extremities: Symmetric 5 x 5 power. Skin: No rashes, lesions or ulcers Psychiatry: Judgement and insight appear normal. Mood & affect appropriate.     Data Reviewed: I have personally reviewed following labs and imaging studies  CBC: Recent Labs  Lab 11/21/20 0201 11/22/20 0542 11/23/20 0428 11/24/20 0514 11/25/20 0148  WBC 5.4 10.6* 10.7* 8.1 6.1  NEUTROABS 3.6 6.5 5.9 4.2 2.6  HGB 12.7* 14.1 11.6* 12.2* 11.5*  HCT 37.7* 41.3 34.6* 35.6* 34.5*  MCV 95.7 96.5 95.6 95.2 96.4  PLT 198 241 208 210 [id number]   Basic Metabolic Panel: Recent Labs  Lab 11/21/20 0201 11/22/20 0542 11/23/20 0428 11/24/20 0514 11/25/20 0148  NA 137 137 135 137 138  K 4.1 3.7 3.6 3.6 3.9  CL 102 100 102 105 109  CO2 '25 26 25 23 24  '$ GLUCOSE 145* 95 176* 102* 127*  BUN 14 19 30* 29* 27*  CREATININE 0.87 1.07 1.49* 1.11 0.97  CALCIUM 8.8* 8.9 8.1* 8.2* 8.0*  MG 1.7 1.7 1.8 1.7 2.0  PHOS 2.8 4.1 3.9 2.7 2.3*   GFR: Estimated Creatinine Clearance: 52.3 mL/min (by C-G formula based on SCr of 0.97 mg/dL). Liver Function Tests: Recent Labs  Lab 11/21/20 0201 11/22/20 0542 11/23/20 0428 11/24/20 0514 11/25/20 0148  AST 50* 70* 37 27 25  ALT 33 50* 34 26 24  ALKPHOS 50 49 45 41 39  BILITOT 0.6 0.7 0.6 0.6 0.6  PROT 6.3* 6.8 5.4* 5.4* 5.3*  ALBUMIN 2.7* 2.9* 2.2* 2.3* 2.2*   No results for input(s): LIPASE, AMYLASE in the last 168 hours. No results for input(s): AMMONIA in the last 168 hours. Coagulation Profile: No results for input(s): INR, PROTIME in the last  168 hours. Cardiac Enzymes: No results for input(s): CKTOTAL, CKMB, CKMBINDEX, TROPONINI in the last 168 hours. BNP (last 3 results) No results for input(s): PROBNP in the last 8760 hours. HbA1C: No results for input(s): HGBA1C in the last 72 hours. CBG: Recent Labs  Lab 11/24/20 0816 11/24/20 1203 11/24/20 1619 11/25/20 0707 11/25/20 1203  GLUCAP 82 103* 164* 80 107*   Lipid Profile: No results for input(s): CHOL, HDL, LDLCALC, TRIG, CHOLHDL, LDLDIRECT in the last 72 hours. Thyroid Function Tests: No results for input(s): TSH, T4TOTAL, FREET4, T3FREE, THYROIDAB in the last 72 hours. Anemia Panel: Recent Labs    11/24/20 0514 11/25/20 0148  FERRITIN 125 133   Sepsis Labs: Recent Labs  Lab 11/22/20 1711 11/22/20 1858 11/23/20 0428 11/24/20 0514 11/24/20 0840 11/25/20 0148  PROCALCITON <0.10  --  0.14 28.50 <0.10 <0.10  LATICACIDVEN 2.7* 2.8*  --   --   --   --     Recent Results (from the past 240 hour(s))  Resp Panel by RT-PCR (Flu A&B, Covid) Nasopharyngeal Swab     Status: Abnormal   Collection Time: 11/20/20  9:06 AM   Specimen:  Nasopharyngeal Swab; Nasopharyngeal(NP) swabs in vial transport medium  Result Value Ref Range Status   SARS Coronavirus 2 by RT PCR POSITIVE (A) NEGATIVE Final    Comment: RESULT CALLED TO, READ BACK BY AND VERIFIED WITH: R,CROGHAN '@1111'$  11/20/20 EB (NOTE) SARS-CoV-2 target nucleic acids are DETECTED.  The SARS-CoV-2 RNA is generally detectable in upper respiratory specimens during the acute phase of infection. Positive results are indicative of the presence of the identified virus, but do not rule out bacterial infection or co-infection with other pathogens not detected by the test. Clinical correlation with patient history and other diagnostic information is necessary to determine patient infection status. The expected result is Negative.  Fact Sheet for Patients: EntrepreneurPulse.com.au  Fact Sheet for  Healthcare Providers: IncredibleEmployment.be  This test is not yet approved or cleared by the [place] FDA and  has been authorized for detection and/or diagnosis of SARS-CoV-2 by FDA under an Emergency Use Authorization (EUA).  This EUA will remain in effect (meaning this test can be used) f or the duration of  the COVID-19 declaration under Section 564(b)(1) of the Act, 21 U.S.C. section 360bbb-3(b)(1), unless the authorization is terminated or revoked sooner.     Influenza A by PCR NEGATIVE NEGATIVE Final   Influenza B by PCR NEGATIVE NEGATIVE Final    Comment: (NOTE) The Xpert Xpress SARS-CoV-2/FLU/RSV plus assay is intended as an aid in the diagnosis of influenza from Nasopharyngeal swab specimens and should not be used as a sole basis for treatment. Nasal washings and aspirates are unacceptable for Xpert Xpress SARS-CoV-2/FLU/RSV testing.  Fact Sheet for Patients: EntrepreneurPulse.com.au  Fact Sheet for Healthcare Providers: IncredibleEmployment.be  This test is not yet approved or cleared by the [place] FDA and has been authorized for detection and/or diagnosis of SARS-CoV-2 by FDA under an Emergency Use Authorization (EUA). This EUA will remain in effect (meaning this test can be used) for the duration of the COVID-19 declaration under Section 564(b)(1) of the Act, 21 U.S.C. section 360bbb-3(b)(1), unless the authorization is terminated or revoked.  Performed at [facility], [place] [address], [place], [place] [address]   Culture, blood (routine x 2)     Status: None (Preliminary result)   Collection Time: 11/22/20  4:45 PM   Specimen: BLOOD RIGHT HAND  Result Value Ref Range Status   Specimen Description BLOOD RIGHT HAND  Final   Special Requests   Final    BOTTLES DRAWN AEROBIC ONLY Blood Culture results may not be optimal due to an inadequate volume of blood received in culture bottles    Culture   Final    NO GROWTH 3 DAYS Performed at [facility], [place] [address], [place], [place] [address]    Report Status PENDING  Incomplete  Culture, blood (routine x 2)     Status: None (Preliminary result)   Collection Time: 11/22/20  5:11 PM   Specimen: BLOOD RIGHT ARM  Result Value Ref Range Status   Specimen Description BLOOD RIGHT ARM  Final   Special Requests   Final    BOTTLES DRAWN AEROBIC AND ANAEROBIC Blood Culture results may not be optimal due to an inadequate volume of blood received in culture bottles   Culture   Final    NO GROWTH 3 DAYS Performed at [facility], [place] [address], [place], [place] [address]    Report Status PENDING  Incomplete  Urine Culture     Status: Abnormal   Collection Time: 11/23/20  4:28 PM   Specimen: Urine, Clean Catch  Result Value Ref Range Status   Specimen Description URINE, CLEAN CATCH  Final   Special Requests NONE  Final   Culture (A)  Final    <10,000 COLONIES/mL INSIGNIFICANT GROWTH Performed at [facility], [address], [place], [place] [address]    Report Status 11/25/2020 FINAL  Final         Radiology Studies: US RENAL  Result Date: 11/23/2020 CLINICAL DATA:  Acute kidney injury. EXAM: RENAL / URINARY TRACT ULTRASOUND COMPLETE COMPARISON:  None. FINDINGS: Right Kidney: Renal measurements: 9.3 cm x 4.4 cm x 5.4 cm = volume: 115.9 mL. Echogenicity within normal limits. No mass or hydronephrosis visualized. Left Kidney: Renal measurements: 10.4 cm x 5.5 cm x 5.6 cm = volume: 168.8 mL. Echogenicity within normal limits. No mass or hydronephrosis visualized. Bladder: Appears normal for degree of bladder distention. Other: None. IMPRESSION: Normal renal ultrasound. Electronically Signed   By: Virgina Norfolk M.D.   On: 11/23/2020 19:57   DG CHEST PORT 1 VIEW  Result Date: 11/24/2020 CLINICAL DATA:  Shortness of breath EXAM: PORTABLE CHEST 1 VIEW COMPARISON:  11/23/2020 FINDINGS: Low lung volumes. Mild  atelectasis at the left lung base. Chronic interstitial changes. Similar cardiomediastinal contours. IMPRESSION: No acute process.  Stable lung aeration. Electronically Signed   By: Macy Mis M.D.   On: 11/24/2020 09:25        Scheduled Meds:   stroke: mapping our early stages of recovery book   Does not apply Once   allopurinol  300 mg Oral Daily   vitamin C  500 mg Oral Daily   aspirin EC  81 mg Oral Daily   atorvastatin  80 mg Oral Daily   baclofen  10 mg Oral BID   clopidogrel  75 mg Oral Daily   divalproex  500 mg Oral QHS   doxepin  25 mg Oral QHS   enoxaparin (LOVENOX) injection  40 mg Subcutaneous Q24H   escitalopram  10 mg Oral Daily   ezetimibe  10 mg Oral Daily   insulin aspart  0-9 Units Subcutaneous TID WC   insulin aspart  3 Units Subcutaneous TID WC   insulin glargine-yfgn  24 Units Subcutaneous QHS   metoprolol succinate  12.5 mg Oral Daily   zinc sulfate  220 mg Oral Daily   Continuous Infusions:  sodium chloride 75 mL/hr at 11/25/20 0513     LOS: 11 days    Time spent: 40 minutes spent on chart review, discussion with nursing staff, consultants, updating family and interview/physical exam; more than 50% of that time was spent in counseling and/or coordination of care.    Bomani Oommen J [place] DO [facility] Available via Epic secure chat 7am-7pm After these hours, please refer to coverage provider listed on [email or web address] 11/25/2020, 1:24 PM

## 2020-11-26 DIAGNOSIS — F418 Other specified anxiety disorders: Secondary | ICD-10-CM | POA: Diagnosis not present

## 2020-11-26 DIAGNOSIS — N179 Acute kidney failure, unspecified: Secondary | ICD-10-CM | POA: Diagnosis not present

## 2020-11-26 DIAGNOSIS — I2510 Atherosclerotic heart disease of native coronary artery without angina pectoris: Secondary | ICD-10-CM | POA: Diagnosis not present

## 2020-11-26 DIAGNOSIS — I639 Cerebral infarction, unspecified: Secondary | ICD-10-CM | POA: Diagnosis not present

## 2020-11-26 LAB — GLUCOSE, CAPILLARY
Glucose-Capillary: 56 mg/dL — ABNORMAL LOW (ref 70–99)
Glucose-Capillary: 66 mg/dL — ABNORMAL LOW (ref 70–99)
Glucose-Capillary: 87 mg/dL (ref 70–99)
Glucose-Capillary: 140 mg/dL — ABNORMAL HIGH (ref 70–99)
Glucose-Capillary: 113 mg/dL — ABNORMAL HIGH (ref 70–99)

## 2020-11-26 MED ORDER — INFLUENZA VAC A&B SA ADJ QUAD 0.5 ML IM PRSY
0.5000 mL | PREFILLED_SYRINGE | INTRAMUSCULAR | Status: AC
  Administered 2020-11-27: 0.5 mL via INTRAMUSCULAR
  Filled 2020-11-26: qty 0.5

## 2020-11-26 MED ORDER — INSULIN GLARGINE-YFGN 100 UNIT/ML ~~LOC~~ SOLN
20.0000 [IU] | Freq: Every day | SUBCUTANEOUS | Status: DC
  Administered 2020-11-26 – 2020-11-30 (×5): 20 [IU] via SUBCUTANEOUS
  Filled 2020-11-26 (×6): qty 0.2

## 2020-11-26 MED ORDER — IPRATROPIUM-ALBUTEROL 20-100 MCG/ACT IN AERS
1.0000 | INHALATION_SPRAY | Freq: Four times a day (QID) | RESPIRATORY_TRACT | Status: DC
  Administered 2020-11-27 – 2020-12-01 (×15): 1 via RESPIRATORY_TRACT
  Filled 2020-11-26: qty 4

## 2020-11-26 NOTE — Progress Notes (Signed)
OT Cancellation Note  Patient Details Name: Fred [name] MRN: [id number] DOB: [birth year]   Cancelled Treatment:    Reason Eval/Treat Not Completed: Patient declined, no reason specified.  Patient with low blood sugar this am, will continue efforts as appropriate.    [name] D [place] Brecht 11/26/2020, 9:39 AM 11/26/2020  RP, OTR/L  Acute Rehabilitation Services  Office:  [phone]

## 2020-11-26 NOTE — Progress Notes (Signed)
Inpatient Diabetes Program Recommendations  AACE/ADA: New Consensus Statement on Inpatient Glycemic Control   Target Ranges:  Prepandial:   less than 140 mg/dL      Peak postprandial:   less than 180 mg/dL (1-2 hours)      Critically ill patients:  140 - 180 mg/dL   Results for Fred [name], Fred [name]" (MRN [id number]) as of 11/26/2020 13:39  Ref. Range 11/26/2020 07:31 11/26/2020 08:15 11/26/2020 09:02 11/26/2020 11:24  Glucose-Capillary Latest Ref Range: 70 - 99 mg/dL 56 (L) 66 (L) 87 140 (H)   Results for Fred [name], Fred [name]" (MRN [id number]) as of 11/26/2020 13:39  Ref. Range 11/25/2020 07:07 11/25/2020 12:03 11/25/2020 16:44 11/25/2020 20:57  Glucose-Capillary Latest Ref Range: 70 - 99 mg/dL 80 107 (H) 85 145 (H)   Review of Glycemic Control  Current orders for Inpatient glycemic control: Semglee 24 units QHS, Novolog 0-9 units TID with meals, Novolog 3 units TID with meals  Inpatient Diabetes Program Recommendations:    Insulin: Please consider decreasing Semglee to 20 units QHS. No meal coverage insulin given on 11/25/20, may need to consider discontinuing meal coverage insulin especially if any other issues with hypoglycemia.  Thanks, Barnie Alderman, RN, MSN, CDE Diabetes Coordinator Inpatient Diabetes Program [phone] (Team Pager from 8am to 5pm)

## 2020-11-26 NOTE — TOC Progression Note (Signed)
Transition of Care [facility]) - Progression Note    Patient Details  Name: Fred [name] MRN: [id number] Date of Birth: [birth year]  [facility] CM/SW Contact  Joanne Chars, LCSW Phone Number: 11/26/2020, 9:54 AM  Clinical Narrative:   CSW spoke with Beverlee Nims at [facility].  10 day quarantine will be completed on 9/19--[facility] can accept pt that day pending new insurance auth.  CSW spoke with pt daughter Butch Penny and updated her.  She is in agreement with this plan as well.      Expected Discharge Plan: [place] Barriers to Discharge: Continued Medical Work up, SNF Pending bed offer  Expected Discharge Plan and Services Expected Discharge Plan: [place] Choice: [place] arrangements for the past 2 months: Single Family Home                                       Social Determinants of Health (SDOH) Interventions    Readmission Risk Interventions No flowsheet data found.

## 2020-11-26 NOTE — Progress Notes (Signed)
Hypoglycemic Event  CBG: 56  Treatment: 4oz of Orange Juice  Symptoms: Asymptomatic  Follow-up CBG: [id number] CBG Result:66       Treatment:  4oz of  Orange Juice  Symptoms: Asymptomatic  Follow-up CBG: Time: 0902   CBG Result:87     Pt ate breakfast after the last CBG check    Possible Reasons for Event: Uknown  Comments/MD notified: Dr. Louanne Belton  Attending is aware    Sallee Provencal

## 2020-11-26 NOTE — Progress Notes (Signed)
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  Patient was previously on dual antiplatelets but had been changed to Eliquis from 10/28/2020 by his cardiologist for atrial fibrillation.  In the ED, vitals were stable.  Labs showed hyperglycemia and creatinine of 1.3.  CT head was negative for acute abnormality but MRI showed 2 punctate foci of acute ischemia in the subcortical right frontal lobe.  Neurology was consulted and patient was admitted to hospital for further stroke work-up.  Assessment & Plan:   Principal Problem:   Acute CVA (cerebrovascular accident) ([place]) Active Problems:   Hypertension associated with diabetes ([place])   Coronary atherosclerosis   Type 2 diabetes mellitus with complication, with long-term current use of insulin ([place])   AKI (acute kidney injury) ([place])   Hyperlipidemia associated with type 2 diabetes mellitus ([place])   Seizure disorder ([place])   Depression with anxiety   Acute subcortical right frontal lobe ischemic infarcts with History of prior CVA with residual left hemiplegia:  MRI of the brain showed acute infarct.  MRA was unremarkable.   CT angio neck with cervical carotid atherosclerosis with 60% narrowing left ICA, 60% bilateral vertebral origin stenosis.  TTE with LVEF 60 to [id number], grade 1 diastolic dysfunction, no aortic stenosis, IVC normal in size.  EEG showed no seizure-like activity.    LDL 52,  hemoglobin A1c 7.8. Plan is to follow-up with [facility] in 4 weeks with Dr. Delice Lesch.  Continue aspirin and Plavix Lipitor.  Continue physical therapy.  Awaiting for skilled nursing facility placement.  COVID-19 viral infection: Incidental on 11/20/2020 completed remdesivir on 11/24/2020.  Continue zinc and vitamin D,  airborne/contact isolation until 11/30/2020  Left ICA stenosis with bilateral vertebral artery stenosis Noted on CTA with 60% narrowing left ICA, 60% bilateral vertebral origin stenosis.  Recommend outpatient follow-up with vascular surgery.   Paroxysmal atrial fibrillation: History of single episode.  On dual antiplatelets so Eliquis has been discontinued.  Continue metoprolol.    Left lower extremity swelling: Lower extremity DVT was negative.  Likely dependent edema.   Acute kidney injury: Resolved Creatinine of 0.9.  Holding losartan.  Renal ultrasound with no mass/hydronephrosis  Hypomagnesemia: Resolved Resolved.   Essential hypertension: On losartan 25 mg p.o. daily, metoprolol succinate 12.5 mg p.o. daily.  Continue metoprolol.  Continue to hold losartan.  Insulin-dependent type 2 diabetes: Continue insulin including long-acting and sliding scale insulin with mealtime insulin here in the hospital.  Patient is on Toujeo 22 units nightly, Jardiance and linagliptin.  Hemoglobin A1c 1.8 on 11/14/2020.   CAD s/p CABG: Continue aspirin, Lipitor, Zetia    Seizure disorder: EEG without any seizure-like activity.  Continue Depakote.    Hyperlipidemia: Continue Lipitor, Zetia   Depression/anxiety: Continue doxepin, Lexapro  Hx Gout Continue allopurinol  DVT prophylaxis: enoxaparin (LOVENOX) injection 40 mg Start: 11/17/20 1200    Code Status: Full Code  Family Communication:  None today.  Disposition Plan:   Status is: Inpatient  Remains inpatient appropriate because:Unsafe d/c plan and Inpatient level of  care appropriate due to severity of  illness  Dispo:  Patient From: Home  Planned Disposition: [place]  Medically stable for discharge: Yes but awaiting 10 day quarantine/isolation before discharge   Consultants:  Neurology  Procedures:  TTE Vascular duplex ultrasound lower extremity EEG  Antimicrobials:  None   Subjective: Today, patient was seen and examined at bedside.  Complains of mild constipation.  Denies any pain, nausea, vomiting, shortness of breath.  Objective: Vitals:   11/24/20 2100 11/25/20 0800 11/25/20 1600 11/25/20 2043  BP: 130/69 (!) 155/90 (!) 154/70 (!) 109/93  Pulse: 64 65 65 95  Resp: '18 18 16 17  '$ Temp:  98.5 F (36.9 C) 98.3 F (36.8 C) 98.3 F (36.8 C)  TempSrc:  Oral Oral Oral  SpO2: 99% 98% 99% 99%  Weight:      Height:        Intake/Output Summary (Last 24 hours) at 11/26/2020 0840 Last data filed at 11/25/2020 1600 Gross per 24 hour  Intake 732.3 ml  Output 400 ml  Net 332.3 ml    Filed Weights   11/13/20 1900 11/13/20 2043  Weight: 82.6 kg 74.8 kg    Physical examination: General:  Average built, not in obvious distress HENT:   No scleral pallor or icterus noted. Oral mucosa is moist.  Chest:  Clear breath sounds.  Diminished breath sounds bilaterally. No crackles or wheezes.  CVS: S1 &S2 heard. No murmur.  Regular rate and rhythm. Abdomen: Soft, nontender, nondistended.  Bowel sounds are heard.   Extremities: No cyanosis, clubbing with trace lower extremity edema, peripheral pulses are palpable. Psych: Alert, awake and oriented, normal mood CNS:  No cranial nerve deficits.  Power equal in all extremities.   Skin: Warm and dry.  No rashes noted.  Data Reviewed: I have personally reviewed the following labs and imaging studies,   CBC: Recent Labs  Lab Dec 09, 2020 0201 11/22/20 0542 11/23/20 0428 11/24/20 0514 11/25/20 0148  WBC 5.4 10.6* 10.7* 8.1 6.1  NEUTROABS 3.6 6.5 5.9 4.2 2.6  HGB 12.7* 14.1 11.6* 12.2* 11.5*  HCT 37.7* 41.3 34.6*  35.6* 34.5*  MCV 95.7 96.5 95.6 95.2 96.4  PLT 198 241 208 210 [id number]    Basic Metabolic Panel: Recent Labs  Lab 2020-12-09 0201 11/22/20 0542 11/23/20 0428 11/24/20 0514 11/25/20 0148  NA 137 137 135 137 138  K 4.1 3.7 3.6 3.6 3.9  CL 102 100 102 105 109  CO2 '25 26 25 23 24  '$ GLUCOSE 145* 95 176* 102* 127*  BUN 14 19 30* 29* 27*  CREATININE 0.87 1.07 1.49* 1.11 0.97  CALCIUM 8.8* 8.9 8.1* 8.2* 8.0*  MG 1.7 1.7 1.8 1.7 2.0  PHOS 2.8 4.1 3.9 2.7 2.3*    GFR: Estimated Creatinine Clearance: 52.3 mL/min (by C-G formula based on SCr of 0.97 mg/dL). Liver Function Tests: Recent Labs  Lab Dec 09, 2020 0201 11/22/20 0542 11/23/20 0428 11/24/20 0514 11/25/20 0148  AST 50* 70* 37 27 25  ALT 33 50* 34 26 24  ALKPHOS 50 49 45 41 39  BILITOT 0.6 0.7 0.6 0.6 0.6  PROT 6.3* 6.8 5.4* 5.4* 5.3*  ALBUMIN 2.7* 2.9* 2.2* 2.3* 2.2*    No results for input(s): LIPASE, AMYLASE in the last 168 hours. No results for input(s): AMMONIA in the last 168 hours. Coagulation Profile: No results for input(s): INR, PROTIME in the last 168 hours. Cardiac Enzymes: No results for input(s): CKTOTAL, CKMB, CKMBINDEX, TROPONINI in the last 168 hours. BNP (last  3 results) No results for input(s): PROBNP in the last 8760 hours. HbA1C: No results for input(s): HGBA1C in the last 72 hours. CBG: Recent Labs  Lab 11/25/20 1203 11/25/20 1644 11/25/20 2057 11/26/20 0731 11/26/20 0815  GLUCAP 107* 85 145* 56* 66*    Lipid Profile: No results for input(s): CHOL, HDL, LDLCALC, TRIG, CHOLHDL, LDLDIRECT in the last 72 hours. Thyroid Function Tests: No results for input(s): TSH, T4TOTAL, FREET4, T3FREE, THYROIDAB in the last 72 hours. Anemia Panel: Recent Labs    11/24/20 0514 11/25/20 0148  FERRITIN 125 133    Sepsis Labs: Recent Labs  Lab 11/22/20 1711 11/22/20 1858 11/23/20 0428 11/24/20 0514 11/24/20 0840 11/25/20 0148  PROCALCITON <0.10  --  0.14 28.50 <0.10 <0.10  LATICACIDVEN 2.7* 2.8*   --   --   --   --      Recent Results (from the past 240 hour(s))  Resp Panel by RT-PCR (Flu A&B, Covid) Nasopharyngeal Swab     Status: Abnormal   Collection Time: 11/20/20  9:06 AM   Specimen: Nasopharyngeal Swab; Nasopharyngeal(NP) swabs in vial transport medium  Result Value Ref Range Status   SARS Coronavirus 2 by RT PCR POSITIVE (A) NEGATIVE Final    Comment: RESULT CALLED TO, READ BACK BY AND VERIFIED WITH: R,CROGHAN '@1111'$  11/20/20 EB (NOTE) SARS-CoV-2 target nucleic acids are DETECTED.  The SARS-CoV-2 RNA is generally detectable in upper respiratory specimens during the acute phase of infection. Positive results are indicative of the presence of the identified virus, but do not rule out bacterial infection or co-infection with other pathogens not detected by the test. Clinical correlation with patient history and other diagnostic information is necessary to determine patient infection status. The expected result is Negative.  Fact Sheet for Patients: EntrepreneurPulse.com.au  Fact Sheet for Healthcare Providers: IncredibleEmployment.be  This test is not yet approved or cleared by the [place] FDA and  has been authorized for detection and/or diagnosis of SARS-CoV-2 by FDA under an Emergency Use Authorization (EUA).  This EUA will remain in effect (meaning this test can be used) f or the duration of  the COVID-19 declaration under Section 564(b)(1) of the Act, 21 U.S.C. section 360bbb-3(b)(1), unless the authorization is terminated or revoked sooner.     Influenza A by PCR NEGATIVE NEGATIVE Final   Influenza B by PCR NEGATIVE NEGATIVE Final    Comment: (NOTE) The Xpert Xpress SARS-CoV-2/FLU/RSV plus assay is intended as an aid in the diagnosis of influenza from Nasopharyngeal swab specimens and should not be used as a sole basis for treatment. Nasal washings and aspirates are unacceptable for Xpert Xpress  SARS-CoV-2/FLU/RSV testing.  Fact Sheet for Patients: EntrepreneurPulse.com.au  Fact Sheet for Healthcare Providers: IncredibleEmployment.be  This test is not yet approved or cleared by the [place] FDA and has been authorized for detection and/or diagnosis of SARS-CoV-2 by FDA under an Emergency Use Authorization (EUA). This EUA will remain in effect (meaning this test can be used) for the duration of the COVID-19 declaration under Section 564(b)(1) of the Act, 21 U.S.C. section 360bbb-3(b)(1), unless the authorization is terminated or revoked.  Performed at [facility], [place] [address] [place], [place] [address]   Culture, blood (routine x 2)     Status: None (Preliminary result)   Collection Time: 11/22/20  4:45 PM   Specimen: BLOOD RIGHT HAND  Result Value Ref Range Status   Specimen Description BLOOD RIGHT HAND  Final   Special Requests   Final  BOTTLES DRAWN AEROBIC ONLY Blood Culture results may not be optimal due to an inadequate volume of blood received in culture bottles   Culture   Final    NO GROWTH 4 DAYS Performed at [facility], [place] [address] [place], [place] [address]    Report Status PENDING  Incomplete  Culture, blood (routine x 2)     Status: None (Preliminary result)   Collection Time: 11/22/20  5:11 PM   Specimen: BLOOD RIGHT ARM  Result Value Ref Range Status   Specimen Description BLOOD RIGHT ARM  Final   Special Requests   Final    BOTTLES DRAWN AEROBIC AND ANAEROBIC Blood Culture results may not be optimal due to an inadequate volume of blood received in culture bottles   Culture   Final    NO GROWTH 4 DAYS Performed at [facility], [place] [address], [place], [place] [address]    Report Status PENDING  Incomplete  Urine Culture     Status: Abnormal   Collection Time: 11/23/20  4:28 PM   Specimen: Urine, Clean Catch  Result Value Ref Range Status   Specimen Description URINE, CLEAN  CATCH  Final   Special Requests NONE  Final   Culture (A)  Final    <10,000 COLONIES/mL INSIGNIFICANT GROWTH Performed at [facility], [place] [address] [place], [place] [address]    Report Status 11/25/2020 FINAL  Final     Radiology Studies: No results found.    Scheduled Meds:   stroke: mapping our early stages of recovery book   Does not apply Once   allopurinol  300 mg Oral Daily   vitamin C  500 mg Oral Daily   aspirin EC  81 mg Oral Daily   atorvastatin  80 mg Oral Daily   baclofen  10 mg Oral BID   clopidogrel  75 mg Oral Daily   divalproex  500 mg Oral QHS   doxepin  25 mg Oral QHS   enoxaparin (LOVENOX) injection  40 mg Subcutaneous Q24H   escitalopram  10 mg Oral Daily   ezetimibe  10 mg Oral Daily   insulin aspart  0-9 Units Subcutaneous TID WC   insulin aspart  3 Units Subcutaneous TID WC   insulin glargine-yfgn  24 Units Subcutaneous QHS   metoprolol succinate  12.5 mg Oral Daily   zinc sulfate  220 mg Oral Daily   Continuous Infusions:  sodium chloride 75 mL/hr at 11/25/20 2138     LOS: 12 days   Flora Lipps, MD [facility] Available via Epic secure chat 7am-7pm After these hours, please refer to coverage provider listed on [email or web address] 11/26/2020, 8:40 AM

## 2020-11-27 DIAGNOSIS — N179 Acute kidney failure, unspecified: Secondary | ICD-10-CM | POA: Diagnosis not present

## 2020-11-27 DIAGNOSIS — I639 Cerebral infarction, unspecified: Secondary | ICD-10-CM | POA: Diagnosis not present

## 2020-11-27 DIAGNOSIS — F418 Other specified anxiety disorders: Secondary | ICD-10-CM | POA: Diagnosis not present

## 2020-11-27 DIAGNOSIS — I2510 Atherosclerotic heart disease of native coronary artery without angina pectoris: Secondary | ICD-10-CM | POA: Diagnosis not present

## 2020-11-27 LAB — CBC
HCT: 36.6 % — ABNORMAL LOW (ref 39.0–52.0)
Hemoglobin: 12.3 g/dL — ABNORMAL LOW (ref 13.0–17.0)
MCH: 32.3 pg (ref 26.0–34.0)
MCHC: 33.6 g/dL (ref 30.0–36.0)
MCV: 96.1 fL (ref 80.0–100.0)
Platelets: 271 10*3/uL (ref 150–400)
RBC: 3.81 MIL/uL — ABNORMAL LOW (ref 4.22–5.81)
RDW: 14.5 % (ref 11.5–15.5)
WBC: 7.2 10*3/uL (ref 4.0–10.5)
nRBC: 0 % (ref 0.0–0.2)

## 2020-11-27 LAB — CULTURE, BLOOD (ROUTINE X 2)
Culture: NO GROWTH
Culture: NO GROWTH

## 2020-11-27 LAB — PHOSPHORUS: Phosphorus: 2.3 mg/dL — ABNORMAL LOW (ref 2.5–4.6)

## 2020-11-27 LAB — BASIC METABOLIC PANEL
Anion gap: 10 (ref 5–15)
BUN: 14 mg/dL (ref 8–23)
CO2: 25 mmol/L (ref 22–32)
Calcium: 8.6 mg/dL — ABNORMAL LOW (ref 8.9–10.3)
Chloride: 106 mmol/L (ref 98–111)
Creatinine, Ser: 0.71 mg/dL (ref 0.61–1.24)
GFR, Estimated: >60 mL/min (ref >60)
Glucose, Bld: 80 mg/dL (ref 70–99)
Potassium: 3.6 mmol/L (ref 3.5–5.1)
Sodium: 141 mmol/L (ref 135–145)

## 2020-11-27 LAB — GLUCOSE, CAPILLARY
Glucose-Capillary: 116 mg/dL — ABNORMAL HIGH (ref 70–99)
Glucose-Capillary: 66 mg/dL — ABNORMAL LOW (ref 70–99)
Glucose-Capillary: 114 mg/dL — ABNORMAL HIGH (ref 70–99)
Glucose-Capillary: 146 mg/dL — ABNORMAL HIGH (ref 70–99)
Glucose-Capillary: 139 mg/dL — ABNORMAL HIGH (ref 70–99)
Glucose-Capillary: 163 mg/dL — ABNORMAL HIGH (ref 70–99)

## 2020-11-27 LAB — MAGNESIUM: Magnesium: 1.6 mg/dL — ABNORMAL LOW (ref 1.7–2.4)

## 2020-11-27 MED ORDER — POTASSIUM & SODIUM PHOSPHATES 280-160-250 MG PO PACK
1.0000 | PACK | Freq: Three times a day (TID) | ORAL | Status: AC
  Administered 2020-11-27 (×3): 1 via ORAL
  Filled 2020-11-27 (×3): qty 1

## 2020-11-27 MED ORDER — MAGNESIUM SULFATE 2 GM/50ML IV SOLN
2.0000 g | Freq: Once | INTRAVENOUS | Status: AC
  Administered 2020-11-27: 2 g via INTRAVENOUS
  Filled 2020-11-27: qty 50

## 2020-11-27 NOTE — Progress Notes (Signed)
Occupational Therapy Treatment Patient Details Name: Fred [name] MRN: [id number] DOB: 01-05-1936 Today's Date: 11/27/2020   History of present illness Pt is an 85 y/o male admitted 9/2 secondary to new onset R weakness. Found to have R frontal lobe infarct. 9/3 rapid response contacted for confusion and disorientation. 9/9 COVID (+).  PMH includes HTN, DM, seizures, a fib, CVA with L sided weakness, CHF, and CAD.   OT comments  Patient progressing and willing to try increased challenge with EOB ADLs as opposed to grooming while fully supported in bed or recliner compared to previous session. Pt did require increased assistance with grooming tasks due to poor sitting balance which did regress to zero sitting balance after ~10 min EOB presumably due to fatigue, although pt repeating, "I'm okay."    Patient remains limited by LT hemiplegia, generalized weakness, bodily stiffness with retropulsion, and decreased activity tolerance along with deficits noted below. Pt continues to demonstrate good rehab potential and would benefit from continued skilled OT to increase safety and independence with ADLs and functional transfers to allow pt to return home safely and reduce caregiver burden and fall risk.    Recommendations for follow up therapy are one component of a multi-disciplinary discharge planning process, led by the attending physician.  Recommendations may be updated based on patient status, additional functional criteria and insurance authorization.    Follow Up Recommendations  SNF    Equipment Recommendations  None recommended by OT    Recommendations for Other Services      Precautions / Restrictions Precautions Precautions: Fall Precaution Comments: left hemiplegia Restrictions Weight Bearing Restrictions: No       Mobility Bed Mobility Overal bed mobility: Needs Assistance Bed Mobility: Rolling;Sidelying to Sit Rolling: Mod assist Sidelying to sit: HOB elevated;Max  assist   Sit to supine: Total assist   General bed mobility comments: cues for sequence with assist to roll and rise from left side with use of rail, increased time and effort with HOB elevated. Once in supine, pt worked on pushing with RLE and puilling with RUE to scoot posteriorly in supine to HOB. Pt's RLE blocked by OT to give pt leverage to push and pt able to mobilize up to [facility] with Mod As of 1.    Transfers                 General transfer comment: Deferred this session due to pt needs +2 for safety.    Balance Overall balance assessment: Needs assistance Sitting-balance support: No upper extremity supported;Feet supported (Unable to support self with LUE while using RT UE for functional tasks.) Sitting balance-Leahy Scale: Zero Sitting balance - Comments: Pt began EOB sitting with poor balance. After sitting up ~10 min pt regressed in balance to zero with heavy pushing to RT laterally and posterioly. Pt denied that pushing was intentional and seemingly unaware. Postural control: Right lateral lean;Posterior lean (pushing/retropulsive)                                 ADL either performed or assessed with clinical judgement   ADL Overall ADL's : Needs assistance/impaired     Grooming: Maximal assistance;Sitting Grooming Details (indicate cue type and reason): Pt performed oral care and face wash while sitting EOB with very poor sitting balance and gradual decline of balance to need of Total Assist to maintain midline. Pt needed Mod As at beginning of EOB sitting with increased  assist need as pt fatigued. Pt educated on 1-handed technique to unscrew toothpaste cap and able to perform in RT hand. Max As and increased time/effort to place toothpaste on brush. Pt then able to use Rt hand to wash face and brush teeth with assist only for sitting balance.                             Functional mobility during ADLs: Maximal assistance;Total  assistance General ADL Comments: Supine<>Sit only this visit.     Vision Baseline Vision/History: 1 Wears glasses Patient Visual Report: No change from baseline Additional Comments: Pt reports that he does wear glasses but unable to locate in room. Noted pt's gaze appears unforcused at times and pt reports this is due to missing glasses. Pt was able to read large print clock on wall accurately.   Perception     Praxis      Cognition Arousal/Alertness: Awake/alert Behavior During Therapy: WFL for tasks assessed/performed Overall Cognitive Status: Impaired/Different from baseline Area of Impairment: Attention                 Orientation Level: Time (Pt struggled with year stating "1390" then "20", then eventually "2020" with increased time and cues.) Current Attention Level: Sustained                    Exercises     Shoulder Instructions       General Comments      Pertinent Vitals/ Pain       Pain Assessment: No/denies pain Faces Pain Scale: No hurt  Home Living                                          Prior Functioning/Environment              Frequency  Min 2X/week        Progress Toward Goals  OT Goals(current goals can now be found in the care plan section)  Progress towards OT goals: Progressing toward goals  Acute Rehab OT Goals Patient Stated Goal: Get out of bed OT Goal Formulation: With patient Time For Goal Achievement: 11/30/20 Potential to Achieve Goals: Good  Plan Discharge plan remains appropriate    Co-evaluation                 AM-PAC OT "6 Clicks" Daily Activity     Outcome Measure   Help from another person eating meals?: A Little Help from another person taking care of personal grooming?: A Little (in fully supported position.  A lot of assist EOB) Help from another person toileting, which includes using toliet, bedpan, or urinal?: Total Help from another person bathing (including  washing, rinsing, drying)?: A Lot Help from another person to put on and taking off regular upper body clothing?: A Lot Help from another person to put on and taking off regular lower body clothing?: Total 6 Click Score: 12    End of Session    OT Visit Diagnosis: Unsteadiness on feet (R26.81);Muscle weakness (generalized) (M62.81);Hemiplegia and hemiparesis Hemiplegia - Right/Left: Left Hemiplegia - dominant/non-dominant: Non-Dominant Hemiplegia - caused by: Cerebral infarction   Activity Tolerance Patient tolerated treatment well   Patient Left in bed;with call bell/phone within reach;with bed alarm set   Nurse Communication  (RN cleared OT to see pt.)  Time: 1110-1144 OT Time Calculation (min): 34 min  Charges: OT General Charges $OT Visit: 1 Visit OT Treatments $Self Care/Home Management : 8-22 mins $Therapeutic Activity: 8-22 mins  Anderson [place], OT Acute Rehab Services Office: [phone] 11/27/2020  Julien Girt 11/27/2020, 12:35 PM

## 2020-11-27 NOTE — Progress Notes (Signed)
Physical Therapy Treatment Patient Details Name: Fred [name] MRN: [id number] DOB: 1935/08/19 Today's Date: 11/27/2020   History of Present Illness Pt is an 85 y/o male admitted 9/2 secondary to new onset R weakness. Found to have R frontal lobe infarct. 9/3 rapid response contacted for confusion and disorientation. 9/9 COVID (+).  PMH includes HTN, DM, seizures, a fib, CVA with L sided weakness, CHF, and CAD.    PT Comments    Worked on strengthening. Also worked on patterns to improve mobility specifically to improve bed mobility. Didn't attempt any further mobility with one person assist.   Recommendations for follow up therapy are one component of a multi-disciplinary discharge planning process, led by the attending physician.  Recommendations may be updated based on patient status, additional functional criteria and insurance authorization.  Follow Up Recommendations  SNF     Equipment Recommendations  None recommended by PT    Recommendations for Other Services       Precautions / Restrictions Precautions Precautions: Fall     Mobility  Bed Mobility                    Transfers                    Ambulation/Gait                 Stairs             Wheelchair Mobility    Modified Rankin (Stroke Patients Only)       Balance                                            Cognition Arousal/Alertness: Awake/alert Behavior During Therapy: WFL for tasks assessed/performed   Area of Impairment: Orientation;Attention                 Orientation Level: Disoriented to;Time Current Attention Level: Sustained                  Exercises General Exercises - Lower Extremity Ankle Circles/Pumps: AROM;10 reps;Right;Supine Short Arc Quad: AROM;Right;10 reps;Supine Heel Slides: AAROM;Right;10 reps;Supine;Left Hip ABduction/ADduction: AAROM;Right;10 reps;Supine;Left Other Exercises Other Exercises: Worked  on trunk rotation in supine toward lt using RUE to reach and RLE to push x 8    General Comments        Pertinent Vitals/Pain Pain Assessment: No/denies pain    Home Living                      Prior Function            PT Goals (current goals can now be found in the care plan section) Progress towards PT goals: Progressing toward goals    Frequency    Min 2X/week      PT Plan Current plan remains appropriate;Frequency needs to be updated    Co-evaluation              AM-PAC PT "6 Clicks" Mobility   Outcome Measure  Help needed turning from your back to your side while in a flat bed without using bedrails?: A Lot Help needed moving from lying on your back to sitting on the side of a flat bed without using bedrails?: Total Help needed moving to and from a bed to a chair (including a wheelchair)?: Total Help needed standing  up from a chair using your arms (e.g., wheelchair or bedside chair)?: Total Help needed to walk in hospital room?: Total   6 Click Score: 6    End of Session   Activity Tolerance: Patient tolerated treatment well Patient left: in bed;with call bell/phone within reach;with bed alarm set   PT Visit Diagnosis: Muscle weakness (generalized) (M62.81);Other abnormalities of gait and mobility (R26.89);Unsteadiness on feet (R26.81)     Time: [id number] PT Time Calculation (min) (ACUTE ONLY): 12 min  Charges:  $Therapeutic Exercise: 8-22 mins                     [place] Pager [phone] Office [place] 11/27/2020, 4:47 PM

## 2020-11-27 NOTE — Progress Notes (Signed)
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  Patient was previously on dual antiplatelets but had been changed to Eliquis from 10/28/2020 by his cardiologist for atrial fibrillation.  In the ED, vitals were stable.  Labs showed hyperglycemia and creatinine of 1.3.  CT head was negative for acute abnormality but MRI showed 2 punctate foci of acute ischemia in the subcortical right frontal lobe.  Neurology was consulted and patient was admitted to the hospital for further stroke work-up.  Assessment & Plan:   Principal Problem:   Acute CVA (cerebrovascular accident) ([place]) Active Problems:   Hypertension associated with diabetes ([place])   Coronary atherosclerosis   Type 2 diabetes mellitus with complication, with long-term current use of insulin ([place])   AKI (acute kidney injury) ([place])   Hyperlipidemia associated with type 2 diabetes mellitus ([place])   Seizure disorder ([place])   Depression with anxiety  Acute subcortical right frontal lobe ischemic infarcts with History of prior CVA with residual left hemiplegia: MRI of the brain showed acute infarct.  MRA was unremarkable.   CT angio neck with cervical carotid atherosclerosis with 60% narrowing left ICA, 60% bilateral vertebral origin stenosis.  TTE with LVEF 60 to [id number], grade 1 diastolic dysfunction, no aortic stenosis, IVC normal in size.  EEG showed no seizure-like activity.    LDL 52,  hemoglobin A1c 7.8. Plan is to follow-up with [facility] in 4 weeks with Dr. Delice Lesch.  Continue aspirin and Plavix Lipitor.  Continue physical therapy.  Awaiting for skilled nursing facility placement.  COVID-19 viral infection: Incidental on 11/20/2020 completed remdesivir on 11/24/2020.  Continue zinc and vitamin D,  airborne/contact isolation until 11/30/2020  Left ICA stenosis with bilateral vertebral artery stenosis Noted on CTA with 60% narrowing left ICA, 60% bilateral vertebral origin stenosis.  Recommend outpatient follow-up with vascular surgery.   Paroxysmal atrial fibrillation: History of single episode.  On dual antiplatelets so Eliquis has been discontinued.  Continue metoprolol.    Left lower extremity swelling: Lower extremity DVT was negative.  Likely dependent edema.   Acute kidney injury: Resolved Creatinine of 0.9.  Holding losartan.  Renal ultrasound with no mass/hydronephrosis  Hypomagnesemia: Resolved Resolved.   Essential hypertension: On losartan 25 mg p.o. daily, metoprolol succinate 12.5 mg p.o. daily.  Continue metoprolol.  Continue to hold losartan.  Insulin-dependent type 2 diabetes: Continue insulin including long-acting and sliding scale insulin with mealtime insulin here in the hospital.  Patient is on Toujeo 22 units nightly, Jardiance and linagliptin at home..  Hemoglobin A1c 7.8 on 11/14/2020.   CAD s/p CABG: Continue aspirin, Lipitor, Zetia    Seizure disorder: EEG without any seizure-like activity.  Continue Depakote.    Hyperlipidemia: Continue Lipitor, Zetia   Depression/anxiety: Continue doxepin, Lexapro  Hx Gout Continue allopurinol  DVT prophylaxis: enoxaparin (LOVENOX) injection 40 mg Start: 11/17/20 1200    Code Status: Full Code  Family Communication:  None today.  Disposition Plan:   Status is: Inpatient  Remains inpatient appropriate because:Unsafe d/c plan and Inpatient level  of care appropriate due to severity of  illness  Dispo:  Patient From: Home  Planned Disposition: [place]  Medically stable for discharge: Yes but awaiting 10 day quarantine/isolation before discharge   Consultants:  Neurology  Procedures:  TTE Vascular duplex ultrasound lower extremity EEG  Antimicrobials:  None   Subjective: Today, patient was seen and examined at bedside.  Denies interval complaints.  Denies chest pain, nausea, vomiting, shortness of breath or dyspnea.  Objective: Vitals:   11/25/20 2043 11/26/20 0941 11/26/20 2100 11/27/20 0657  BP: (!) 109/93 (!) 124/107 (!) 166/77 (!) 162/85  Pulse: 95 99 62 63  Resp: '17  19 17  '$ Temp: 98.3 F (36.8 C)  98.3 F (36.8 C) 97.6 F (36.4 C)  TempSrc: Oral  Oral Oral  SpO2: 99%  100% 99%  Weight:      Height:        Intake/Output Summary (Last 24 hours) at 11/27/2020 1028 Last data filed at 11/27/2020 0700 Gross per 24 hour  Intake 2890.12 ml  Output 400 ml  Net 2490.12 ml    Filed Weights   11/13/20 1900 11/13/20 2043  Weight: 82.6 kg 74.8 kg   Physical examination: General:  Average built, not in obvious distress HENT:   No scleral pallor or icterus noted. Oral mucosa is moist.  Chest:   Diminished breath sounds bilaterally. No crackles or wheezes.  CVS: S1 &S2 heard. No murmur.  Regular rate and rhythm. Abdomen: Soft, nontender, nondistended.  Bowel sounds are heard.   Extremities: No cyanosis, clubbing with trace lower extremity edema, peripheral pulses are palpable. Psych: Alert, awake and oriented, normal mood CNS:  No cranial nerve deficits.  Power equal in all extremities.   Skin: Warm and dry.  No rashes noted.  Data Reviewed: I have personally reviewed the following labs and imaging studies,   CBC: Recent Labs  Lab Dec 01, 2020 0201 11/22/20 0542 11/23/20 0428 11/24/20 0514 11/25/20 0148 11/27/20 0152  WBC 5.4 10.6* 10.7* 8.1 6.1 7.2  NEUTROABS 3.6 6.5 5.9 4.2 2.6  --   HGB 12.7* 14.1 11.6* 12.2* 11.5* 12.3*   HCT 37.7* 41.3 34.6* 35.6* 34.5* 36.6*  MCV 95.7 96.5 95.6 95.2 96.4 96.1  PLT 198 241 208 210 228 [id number]    Basic Metabolic Panel: Recent Labs  Lab 11/22/20 0542 11/23/20 0428 11/24/20 0514 11/25/20 0148 11/27/20 0152  NA 137 135 137 138 141  K 3.7 3.6 3.6 3.9 3.6  CL 100 102 105 109 106  CO2 '26 25 23 24 25  '$ GLUCOSE 95 176* 102* 127* 80  BUN 19 30* 29* 27* 14  CREATININE 1.07 1.49* 1.11 0.97 0.71  CALCIUM 8.9 8.1* 8.2* 8.0* 8.6*  MG 1.7 1.8 1.7 2.0 1.6*  PHOS 4.1 3.9 2.7 2.3* 2.3*    GFR: Estimated Creatinine Clearance: 63.4 mL/min (by C-G formula based on SCr of 0.71 mg/dL). Liver Function Tests: Recent Labs  Lab 12-01-2020 0201 11/22/20 0542 11/23/20 0428 11/24/20 0514 11/25/20 0148  AST 50* 70* 37 27 25  ALT 33 50* 34 26 24  ALKPHOS 50 49 45 41 39  BILITOT 0.6 0.7 0.6 0.6 0.6  PROT 6.3* 6.8 5.4* 5.4* 5.3*  ALBUMIN 2.7* 2.9* 2.2* 2.3* 2.2*    No results for input(s): LIPASE, AMYLASE in the last 168 hours. No results for input(s): AMMONIA in the last 168 hours. Coagulation Profile: No results for input(s): INR, PROTIME in the last 168 hours. Cardiac Enzymes: No results for input(s): CKTOTAL, CKMB,  CKMBINDEX, TROPONINI in the last 168 hours. BNP (last 3 results) No results for input(s): PROBNP in the last 8760 hours. HbA1C: No results for input(s): HGBA1C in the last 72 hours. CBG: Recent Labs  Lab 11/26/20 0731 11/26/20 0815 11/26/20 0902 11/26/20 1124 11/26/20 1739  GLUCAP 56* 66* 87 140* 113*    Lipid Profile: No results for input(s): CHOL, HDL, LDLCALC, TRIG, CHOLHDL, LDLDIRECT in the last 72 hours. Thyroid Function Tests: No results for input(s): TSH, T4TOTAL, FREET4, T3FREE, THYROIDAB in the last 72 hours. Anemia Panel: Recent Labs    11/25/20 0148  FERRITIN 133    Sepsis Labs: Recent Labs  Lab 11/22/20 1711 11/22/20 1858 11/23/20 0428 11/24/20 0514 11/24/20 0840 11/25/20 0148  PROCALCITON <0.10  --  0.14 28.50 <0.10 <0.10   LATICACIDVEN 2.7* 2.8*  --   --   --   --      Recent Results (from the past 240 hour(s))  Resp Panel by RT-PCR (Flu A&B, Covid) Nasopharyngeal Swab     Status: Abnormal   Collection Time: 11/20/20  9:06 AM   Specimen: Nasopharyngeal Swab; Nasopharyngeal(NP) swabs in vial transport medium  Result Value Ref Range Status   SARS Coronavirus 2 by RT PCR POSITIVE (A) NEGATIVE Final    Comment: RESULT CALLED TO, READ BACK BY AND VERIFIED WITH: R,CROGHAN '@1111'$  11/20/20 EB (NOTE) SARS-CoV-2 target nucleic acids are DETECTED.  The SARS-CoV-2 RNA is generally detectable in upper respiratory specimens during the acute phase of infection. Positive results are indicative of the presence of the identified virus, but do not rule out bacterial infection or co-infection with other pathogens not detected by the test. Clinical correlation with patient history and other diagnostic information is necessary to determine patient infection status. The expected result is Negative.  Fact Sheet for Patients: EntrepreneurPulse.com.au  Fact Sheet for Healthcare Providers: IncredibleEmployment.be  This test is not yet approved or cleared by the [place] FDA and  has been authorized for detection and/or diagnosis of SARS-CoV-2 by FDA under an Emergency Use Authorization (EUA).  This EUA will remain in effect (meaning this test can be used) f or the duration of  the COVID-19 declaration under Section 564(b)(1) of the Act, 21 U.S.C. section 360bbb-3(b)(1), unless the authorization is terminated or revoked sooner.     Influenza A by PCR NEGATIVE NEGATIVE Final   Influenza B by PCR NEGATIVE NEGATIVE Final    Comment: (NOTE) The Xpert Xpress SARS-CoV-2/FLU/RSV plus assay is intended as an aid in the diagnosis of influenza from Nasopharyngeal swab specimens and should not be used as a sole basis for treatment. Nasal washings and aspirates are unacceptable for Xpert  Xpress SARS-CoV-2/FLU/RSV testing.  Fact Sheet for Patients: EntrepreneurPulse.com.au  Fact Sheet for Healthcare Providers: IncredibleEmployment.be  This test is not yet approved or cleared by the [place] FDA and has been authorized for detection and/or diagnosis of SARS-CoV-2 by FDA under an Emergency Use Authorization (EUA). This EUA will remain in effect (meaning this test can be used) for the duration of the COVID-19 declaration under Section 564(b)(1) of the Act, 21 U.S.C. section 360bbb-3(b)(1), unless the authorization is terminated or revoked.  Performed at [facility], [place] [address], [place], [place] [address]   Culture, blood (routine x 2)     Status: None   Collection Time: 11/22/20  4:45 PM   Specimen: BLOOD RIGHT HAND  Result Value Ref Range Status   Specimen Description BLOOD RIGHT HAND  Final   Special Requests  Final    BOTTLES DRAWN AEROBIC ONLY Blood Culture results may not be optimal due to an inadequate volume of blood received in culture bottles   Culture   Final    NO GROWTH 5 DAYS Performed at [facility], [place] [address] [place], [place] [address]    Report Status 11/27/2020 FINAL  Final  Culture, blood (routine x 2)     Status: None   Collection Time: 11/22/20  5:11 PM   Specimen: BLOOD RIGHT ARM  Result Value Ref Range Status   Specimen Description BLOOD RIGHT ARM  Final   Special Requests   Final    BOTTLES DRAWN AEROBIC AND ANAEROBIC Blood Culture results may not be optimal due to an inadequate volume of blood received in culture bottles   Culture   Final    NO GROWTH 5 DAYS Performed at [facility], [place] [address] [place], [place] [address]    Report Status 11/27/2020 FINAL  Final  Urine Culture     Status: Abnormal   Collection Time: 11/23/20  4:28 PM   Specimen: Urine, Clean Catch  Result Value Ref Range Status   Specimen Description URINE, CLEAN CATCH  Final   Special  Requests NONE  Final   Culture (A)  Final    <10,000 COLONIES/mL INSIGNIFICANT GROWTH Performed at [facility], [place] [address] [place], [place] [address]    Report Status 11/25/2020 FINAL  Final     Radiology Studies: No results found.    Scheduled Meds:   stroke: mapping our early stages of recovery book   Does not apply Once   allopurinol  300 mg Oral Daily   vitamin C  500 mg Oral Daily   aspirin EC  81 mg Oral Daily   atorvastatin  80 mg Oral Daily   baclofen  10 mg Oral BID   clopidogrel  75 mg Oral Daily   divalproex  500 mg Oral QHS   doxepin  25 mg Oral QHS   enoxaparin (LOVENOX) injection  40 mg Subcutaneous Q24H   escitalopram  10 mg Oral Daily   ezetimibe  10 mg Oral Daily   insulin aspart  0-9 Units Subcutaneous TID WC   insulin glargine-yfgn  20 Units Subcutaneous QHS   Ipratropium-Albuterol  1 puff Inhalation Q6H   metoprolol succinate  12.5 mg Oral Daily   potassium & sodium phosphates  1 packet Oral TID WC & HS   zinc sulfate  220 mg Oral Daily   Continuous Infusions:  sodium chloride 75 mL/hr at 11/27/20 0700     LOS: 13 days   Flora Lipps, MD [facility] Available via Epic secure chat 7am-7pm After these hours, please refer to coverage provider listed on [email or web address] 11/27/2020, 10:28 AM

## 2020-11-28 DIAGNOSIS — I639 Cerebral infarction, unspecified: Secondary | ICD-10-CM | POA: Diagnosis not present

## 2020-11-28 LAB — BASIC METABOLIC PANEL
Anion gap: 9 (ref 5–15)
BUN: 13 mg/dL (ref 8–23)
CO2: 25 mmol/L (ref 22–32)
Calcium: 8.4 mg/dL — ABNORMAL LOW (ref 8.9–10.3)
Chloride: 106 mmol/L (ref 98–111)
Creatinine, Ser: 0.9 mg/dL (ref 0.61–1.24)
GFR, Estimated: >60 mL/min (ref >60)
Glucose, Bld: 130 mg/dL — ABNORMAL HIGH (ref 70–99)
Potassium: 3.7 mmol/L (ref 3.5–5.1)
Sodium: 140 mmol/L (ref 135–145)

## 2020-11-28 LAB — PHOSPHORUS: Phosphorus: 2.5 mg/dL (ref 2.5–4.6)

## 2020-11-28 LAB — MAGNESIUM: Magnesium: 1.7 mg/dL (ref 1.7–2.4)

## 2020-11-28 LAB — GLUCOSE, CAPILLARY
Glucose-Capillary: 95 mg/dL (ref 70–99)
Glucose-Capillary: 147 mg/dL — ABNORMAL HIGH (ref 70–99)
Glucose-Capillary: 175 mg/dL — ABNORMAL HIGH (ref 70–99)

## 2020-11-28 MED ORDER — DOXEPIN HCL 10 MG PO CAPS
10.0000 mg | ORAL_CAPSULE | Freq: Every day | ORAL | Status: DC
  Administered 2020-11-28 – 2020-11-30 (×3): 10 mg via ORAL
  Filled 2020-11-28 (×4): qty 1

## 2020-11-28 MED ORDER — EMPAGLIFLOZIN 25 MG PO TABS
25.0000 mg | ORAL_TABLET | Freq: Every day | ORAL | Status: DC
  Administered 2020-11-28 – 2020-12-01 (×4): 25 mg via ORAL
  Filled 2020-11-28 (×4): qty 1

## 2020-11-28 MED ORDER — TAMSULOSIN HCL 0.4 MG PO CAPS
0.4000 mg | ORAL_CAPSULE | Freq: Every day | ORAL | Status: DC
  Administered 2020-11-28 – 2020-11-30 (×3): 0.4 mg via ORAL
  Filled 2020-11-28 (×3): qty 1

## 2020-11-28 NOTE — Progress Notes (Addendum)
PROGRESS NOTE  Fred [name]    DOB: 06/19/1935, 85 y.o.  [id number]  PCP: Fred Marry, MD   Code Status: Full Code   DOA: 11/13/2020   LOS: 65  Brief Narrative of Current Hospitalization  Fred [name] is a 85 y.o. 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 They presented from home to the ED on 11/13/2020 with new CVA as seen on MRI brain.  Patient was admitted to medicine service for further workup and management of CVA as outlined in detail below.  11/28/20 -stable  Assessment & Plan  Principal Problem:   Acute CVA (cerebrovascular accident) ([place]) Active Problems:   Hypertension associated with diabetes ([place])   Coronary atherosclerosis   Type 2 diabetes mellitus with complication, with long-term current use of insulin ([place])   AKI (acute kidney injury) ([place])   Hyperlipidemia associated with type 2 diabetes mellitus ([place])   Seizure disorder ([place])   Depression with anxiety  Acute subcortical right frontal lobe ischemic infarcts with History of prior CVA with residual left hemiplegia: - follow-up with [facility] in 4 weeks with Dr. Delice Lesch.   - Continue aspirin and Plavix Lipitor - physical therapy.   - Awaiting for skilled nursing facility placement.   COVID-19 viral infection: dx on 11/20/2020. Asymptomatic, stable. completed remdesivir on 11/24/2020.  Continue zinc and vitamin D,  airborne/contact isolation until 11/30/2020   Left ICA stenosis with bilateral vertebral artery stenosis Noted on CTA with 60% narrowing left ICA, 60% bilateral vertebral origin stenosis.  Recommend outpatient follow-up with vascular surgery.   Paroxysmal atrial fibrillation: History of single episode.  On dual antiplatelets so Eliquis has  been discontinued.  Continue metoprolol.   Urinary retention: likely 2/2 BPH but also could be adverse effect of doxepin. - in & out cath for retention ~800cc on bladder scan.  - start flomax - decrease doxepin    Acute kidney injury: Resolved Creatinine of 0.9.   Hypomagnesemia: Resolved mg++ 1.7 today   HTN- stable and well controlled while losartan has been held.  On losartan 25 mg p.o. daily, metoprolol succinate 12.5 mg p.o. daily.  Continue metoprolol.  Continue to hold losartan.   Insulin-dependent type 2 diabetes: Hgb A1c at goal 7.8. well controlled with minimal insulin - discontinue sliding scale - restart jardiance for DM and heart protection - blood glucose monitoring with morning labs   CAD s/p CABG: Continue aspirin, Lipitor, Zetia, jardiance   Seizure disorder: EEG without any seizure-like activity.  Continue Depakote.    Hyperlipidemia: Continue Lipitor, Zetia   Depression/anxiety: Continue doxepin, Lexapro   Hx Gout Continue allopurinol  DVT prophylaxis: enoxaparin (LOVENOX) injection 40 mg Start: 11/17/20 1200   Diet:  Diet Orders (From admission, onward)     Start     Ordered   11/13/20 2255  Diet heart healthy/carb modified Room service appropriate? Yes; Fluid consistency: Thin  Diet effective now       Question Answer Comment  Diet-HS Snack? Nothing   Room service appropriate? Yes   Fluid consistency: Thin      11/13/20 2254            Subjective 11/28/20    Pt reports no complaints today. He feels well and is anxious for discharge.  Disposition Plan & Communication  Status is: Inpatient  Remains inpatient  appropriate because:Unsafe d/c plan  Dispo:  Patient From: Home  Planned Disposition: [place]  Medically stable for discharge: Yes          Family Communication: NA   Consults, Procedures, Significant Events  Consultants:  PT/OT  Procedures/significant events:  none  Antimicrobials:   Anti-infectives (From admission, onward)    Start     Dose/Rate Route Frequency Ordered Stop   11/21/20 1000  remdesivir 100 mg in sodium chloride 0.9 % 100 mL IVPB       See Hyperspace for full Linked Orders Report.   100 mg 200 mL/hr over 30 Minutes Intravenous Daily 11/20/20 1520 11/24/20 0939   11/20/20 1615  remdesivir 200 mg in sodium chloride 0.9% 250 mL IVPB       See Hyperspace for full Linked Orders Report.   200 mg 580 mL/hr over 30 Minutes Intravenous Once 11/20/20 1520 11/20/20 1834        Objective   Vitals:   11/27/20 1248 11/27/20 2212 11/28/20 0329 11/28/20 0603  BP: (!) 144/69 (!) 123/51 (!) 109/53 122/65  Pulse: 63 63 70 72  Resp: '16 19 20 19  '$ Temp:  98.3 F (36.8 C)  98.5 F (36.9 C)  TempSrc:  Oral  Oral  SpO2: 100% 96% 95% 95%  Weight:      Height:        Intake/Output Summary (Last 24 hours) at 11/28/2020 0717 Last data filed at 11/28/2020 0602 Gross per 24 hour  Intake 885.37 ml  Output --  Net 885.37 ml   Filed Weights   11/13/20 1900 11/13/20 2043  Weight: 82.6 kg 74.8 kg    Patient BMI: Body mass index is 29.23 kg/m.   Physical Exam: General: awake, alert, NAD HEENT: atraumatic, clear conjunctiva, anicteric sclera, moist mucus membranes, hearing grossly normal Respiratory: CTAB, no wheezes, rales or rhonchi, normal respiratory effort. Cardiovascular: normal S1/S2, RRR, no JVD, murmurs, rubs, gallops, quick capillary refill  Gastrointestinal: soft, NT, ND, no HSM felt, +bowel sounds. Nervous: A&O x3. no gross focal neurologic deficits, normal speech Extremities: moves all equally, no edema, normal tone Skin: dry, intact, normal temperature, normal color Psychiatry: normal mood, congruent affect, judgement and insight appear normal  Labs   I have personally reviewed following labs and imaging studies  Recent Results (from the past 240 hour(s))  Resp Panel by RT-PCR (Flu A&B, Covid) Nasopharyngeal Swab     Status: Abnormal    Collection Time: 11/20/20  9:06 AM   Specimen: Nasopharyngeal Swab; Nasopharyngeal(NP) swabs in vial transport medium  Result Value Ref Range Status   SARS Coronavirus 2 by RT PCR POSITIVE (A) NEGATIVE Final    Comment: RESULT CALLED TO, READ BACK BY AND VERIFIED WITH: R,CROGHAN '@1111'$  11/20/20 EB (NOTE) SARS-CoV-2 target nucleic acids are DETECTED.  The SARS-CoV-2 RNA is generally detectable in upper respiratory specimens during the acute phase of infection. Positive results are indicative of the presence of the identified virus, but do not rule out bacterial infection or co-infection with other pathogens not detected by the test. Clinical correlation with patient history and other diagnostic information is necessary to determine patient infection status. The expected result is Negative.  Fact Sheet for Patients: EntrepreneurPulse.com.au  Fact Sheet for Healthcare Providers: IncredibleEmployment.be  This test is not yet approved or cleared by the [place] FDA and  has been authorized for detection and/or diagnosis of SARS-CoV-2 by FDA under an Emergency Use Authorization (EUA).  This EUA will remain in effect (meaning  this test can be used) f or the duration of  the COVID-19 declaration under Section 564(b)(1) of the Act, 21 U.S.C. section 360bbb-3(b)(1), unless the authorization is terminated or revoked sooner.     Influenza A by PCR NEGATIVE NEGATIVE Final   Influenza B by PCR NEGATIVE NEGATIVE Final    Comment: (NOTE) The Xpert Xpress SARS-CoV-2/FLU/RSV plus assay is intended as an aid in the diagnosis of influenza from Nasopharyngeal swab specimens and should not be used as a sole basis for treatment. Nasal washings and aspirates are unacceptable for Xpert Xpress SARS-CoV-2/FLU/RSV testing.  Fact Sheet for Patients: EntrepreneurPulse.com.au  Fact Sheet for Healthcare  Providers: IncredibleEmployment.be  This test is not yet approved or cleared by the [place] FDA and has been authorized for detection and/or diagnosis of SARS-CoV-2 by FDA under an Emergency Use Authorization (EUA). This EUA will remain in effect (meaning this test can be used) for the duration of the COVID-19 declaration under Section 564(b)(1) of the Act, 21 U.S.C. section 360bbb-3(b)(1), unless the authorization is terminated or revoked.  Performed at [facility], [place] [address] [place], [place] [address]   Culture, blood (routine x 2)     Status: None   Collection Time: 11/22/20  4:45 PM   Specimen: BLOOD RIGHT HAND  Result Value Ref Range Status   Specimen Description BLOOD RIGHT HAND  Final   Special Requests   Final    BOTTLES DRAWN AEROBIC ONLY Blood Culture results may not be optimal due to an inadequate volume of blood received in culture bottles   Culture   Final    NO GROWTH 5 DAYS Performed at [facility], [place] [address], [place], [place] [address]    Report Status 11/27/2020 FINAL  Final  Culture, blood (routine x 2)     Status: None   Collection Time: 11/22/20  5:11 PM   Specimen: BLOOD RIGHT ARM  Result Value Ref Range Status   Specimen Description BLOOD RIGHT ARM  Final   Special Requests   Final    BOTTLES DRAWN AEROBIC AND ANAEROBIC Blood Culture results may not be optimal due to an inadequate volume of blood received in culture bottles   Culture   Final    NO GROWTH 5 DAYS Performed at [facility], [place] [address], [place], [place] [address]    Report Status 11/27/2020 FINAL  Final  Urine Culture     Status: Abnormal   Collection Time: 11/23/20  4:28 PM   Specimen: Urine, Clean Catch  Result Value Ref Range Status   Specimen Description URINE, CLEAN CATCH  Final   Special Requests NONE  Final   Culture (A)  Final    <10,000 COLONIES/mL INSIGNIFICANT GROWTH Performed at [facility], [place] [address] [place], [place] [address]    Report Status 11/25/2020 FINAL  Final     Imaging Studies  No results found. Medications   Scheduled Meds:   stroke: mapping our early stages of recovery book   Does not apply Once   allopurinol  300 mg Oral Daily   vitamin C  500 mg Oral Daily   aspirin EC  81 mg Oral Daily   atorvastatin  80 mg Oral Daily   baclofen  10 mg Oral BID   clopidogrel  75 mg Oral Daily   divalproex  500 mg Oral QHS   doxepin  25 mg Oral QHS   enoxaparin (LOVENOX) injection  40 mg Subcutaneous  Q24H   escitalopram  10 mg Oral Daily   ezetimibe  10 mg Oral Daily   insulin aspart  0-9 Units Subcutaneous TID WC   insulin glargine-yfgn  20 Units Subcutaneous QHS   Ipratropium-Albuterol  1 puff Inhalation Q6H   metoprolol succinate  12.5 mg Oral Daily   zinc sulfate  220 mg Oral Daily   Continuous Infusions:  sodium chloride 75 mL/hr at 11/28/20 0602     LOS: 14 days   Time spent: >67mn   Nadra Hritz L Jered Heiny, DO [facility] 11/28/2020, 7:17 AM   To contact the TG[facility] Surgery Center LLCAttending or Consulting provider for this patient: Check the care team in [facility] Centerfor a) attending/consulting TForsythprovider listed and b) the T[facility] Charlestonteam listed Log into [email or web address] and use [place]'s universal password to access. If you do not have the password, please contact the hospital operator. Locate the TAur[facility] Center LLCprovider you are looking for under [facility] and page to a number that you can be directly reached. If you still have difficulty reaching the provider, please page the [facility](Director on Call) for the Hospitalists listed on amion for assistance.

## 2020-11-28 NOTE — Progress Notes (Signed)
Patient complaining of severe lower mid abdominal pain and inability to urinate.  MD made aware.  Bladder scanned for 798 ml.  Order obtained for I&O cath.

## 2020-11-28 NOTE — Progress Notes (Signed)
I&O cath. was performed with urine output of 1135m.

## 2020-11-29 DIAGNOSIS — U071 COVID-19: Secondary | ICD-10-CM | POA: Diagnosis present

## 2020-11-29 DIAGNOSIS — I639 Cerebral infarction, unspecified: Secondary | ICD-10-CM | POA: Diagnosis not present

## 2020-11-29 DIAGNOSIS — G40909 Epilepsy, unspecified, not intractable, without status epilepticus: Secondary | ICD-10-CM | POA: Diagnosis not present

## 2020-11-29 DIAGNOSIS — N179 Acute kidney failure, unspecified: Secondary | ICD-10-CM | POA: Diagnosis not present

## 2020-11-29 LAB — BASIC METABOLIC PANEL
Anion gap: 7 (ref 5–15)
BUN: 14 mg/dL (ref 8–23)
CO2: 26 mmol/L (ref 22–32)
Calcium: 8.4 mg/dL — ABNORMAL LOW (ref 8.9–10.3)
Chloride: 109 mmol/L (ref 98–111)
Creatinine, Ser: 0.99 mg/dL (ref 0.61–1.24)
GFR, Estimated: >60 mL/min (ref >60)
Glucose, Bld: 178 mg/dL — ABNORMAL HIGH (ref 70–99)
Potassium: 3.6 mmol/L (ref 3.5–5.1)
Sodium: 142 mmol/L (ref 135–145)

## 2020-11-29 LAB — GLUCOSE, CAPILLARY
Glucose-Capillary: 123 mg/dL — ABNORMAL HIGH (ref 70–99)
Glucose-Capillary: 177 mg/dL — ABNORMAL HIGH (ref 70–99)
Glucose-Capillary: 160 mg/dL — ABNORMAL HIGH (ref 70–99)

## 2020-11-29 MED ORDER — SENNA 8.6 MG PO TABS
1.0000 | ORAL_TABLET | Freq: Once | ORAL | Status: AC
  Administered 2020-11-29: 8.6 mg via ORAL
  Filled 2020-11-29: qty 1

## 2020-11-29 MED ORDER — LOSARTAN POTASSIUM 25 MG PO TABS
25.0000 mg | ORAL_TABLET | Freq: Every day | ORAL | Status: DC
  Administered 2020-11-29 – 2020-12-01 (×3): 25 mg via ORAL
  Filled 2020-11-29 (×3): qty 1

## 2020-11-29 MED ORDER — POLYETHYLENE GLYCOL 3350 17 G PO PACK
17.0000 g | PACK | Freq: Two times a day (BID) | ORAL | Status: DC
  Administered 2020-11-29 – 2020-12-01 (×4): 17 g via ORAL
  Filled 2020-11-29 (×5): qty 1

## 2020-11-29 MED ORDER — IPRATROPIUM-ALBUTEROL 0.5-2.5 (3) MG/3ML IN SOLN
3.0000 mL | Freq: Once | RESPIRATORY_TRACT | Status: DC
  Filled 2020-11-29: qty 3

## 2020-11-29 MED ORDER — FLUTICASONE PROPIONATE 50 MCG/ACT NA SUSP
2.0000 | Freq: Every day | NASAL | Status: DC
  Administered 2020-11-29 – 2020-12-01 (×3): 2 via NASAL
  Filled 2020-11-29 (×2): qty 16

## 2020-11-29 NOTE — Progress Notes (Signed)
PROGRESS NOTE  Fred CARRAHER    DOB: 03-05-1936, 85 y.o.  [id number]  PCP: [name], MD   Code Status: Full Code   DOA: 11/13/2020   LOS: 28  Brief Narrative of Current Hospitalization  Fred [name] is a 85 y.o. 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 They presented from home to the ED on 11/13/2020 with new CVA as seen on MRI brain.  Patient was admitted to medicine service for further workup and management of CVA as outlined in detail below.  11/29/20 -stable  Assessment & Plan  Principal Problem:   Acute CVA (cerebrovascular accident) ([place]) Active Problems:   Hypertension associated with diabetes ([place])   Coronary atherosclerosis   Type 2 diabetes mellitus with complication, with long-term current use of insulin ([place])   AKI (acute kidney injury) ([place])   Hyperlipidemia associated with type 2 diabetes mellitus ([place])   Seizure disorder ([place])   Depression with anxiety  Acute subcortical right frontal lobe ischemic infarcts with History of prior CVA with residual left hemiplegia: - follow-up with [place] neurology in 4 weeks (around 9/30) with Dr. Delice Lesch.   - Continue aspirin and Plavix Lipitor - PT/OT  - Awaiting for skilled nursing facility placement.   COVID-19 viral infection: dx on 11/20/2020. Asymptomatic, stable. completed remdesivir on 11/24/2020.  Continue zinc and vitamin D,  airborne/contact isolation until 11/30/2020. His granddaughter endorses that he has congestion and wheezing but patient denies any complaints.  - flonase daily - duoneb x1 and recheck   Left ICA stenosis with bilateral vertebral artery stenosis Noted on CTA with 60% narrowing left ICA, 60% bilateral vertebral origin stenosis.  Recommend  outpatient follow-up with vascular surgery.   Paroxysmal atrial fibrillation: History of single episode.  On dual antiplatelets so Eliquis has been discontinued.  Continue metoprolol.   Acute Urinary retention: likely 2/2 BPH but also could be adverse effect of doxepin. I&O cath yesterday >1L.Cr stable - continue flomax - decreased doxepin yesterday, consider discontinuing as urinary retention is side effect - strict I/O - post void residual today - BMP am   Acute kidney injury: Resolved Creatinine of 0.9.9   Hypomagnesemia: Resolved   HTN- stable, moderately well controlled while losartan has been held.  On losartan 25 mg p.o. daily, metoprolol succinate 12.5 mg p.o. daily at home.  Continue metoprolol, restart losartan   Insulin-dependent type 2 diabetes: Hgb A1c at goal 7.8. well controlled with minimal insulin - discontinue sliding scale - restart jardiance for DM and heart protection - blood glucose monitoring with morning labs   CAD s/p CABG: Continue aspirin, Lipitor, Zetia, jardiance   Seizure disorder: EEG without any seizure-like activity.  Continue Depakote.    Hyperlipidemia: Continue Lipitor, Zetia   Depression/anxiety: Continue doxepin, Lexapro   Hx Gout Continue allopurinol  DVT prophylaxis: enoxaparin (LOVENOX) injection 40 mg Start: 11/17/20 1200   Diet:  Diet Orders (From admission, onward)     Start     Ordered   11/13/20 2255  Diet heart healthy/carb modified Room service appropriate? Yes; Fluid consistency: Thin  Diet effective now       Question Answer Comment  Diet-HS Snack? Nothing   Room service appropriate? Yes   Fluid consistency: Thin      11/13/20 2254  Subjective 11/29/20    Patient feels well today. No complaints or concerns. Endorses urine output. Eating [employer] for breakfast with granddaughter and eager to be discharged.   Disposition Plan & Communication  Status is: Inpatient  Remains inpatient appropriate  because:Unsafe d/c plan  Dispo:  Patient From: Home  Planned Disposition: [place]  Medically stable for discharge: Yes     Family Communication: granddaughter at bedside  Consults, Procedures, Significant Events  Consultants:  PT/OT  Procedures/significant events:  none  Antimicrobials:  Anti-infectives (From admission, onward)    Start     Dose/Rate Route Frequency Ordered Stop   11/21/20 1000  remdesivir 100 mg in sodium chloride 0.9 % 100 mL IVPB       See Hyperspace for full Linked Orders Report.   100 mg 200 mL/hr over 30 Minutes Intravenous Daily 11/20/20 1520 11/24/20 0939   11/20/20 1615  remdesivir 200 mg in sodium chloride 0.9% 250 mL IVPB       See Hyperspace for full Linked Orders Report.   200 mg 580 mL/hr over 30 Minutes Intravenous Once 11/20/20 1520 11/20/20 1834        Objective   Vitals:   11/28/20 0823 11/28/20 0840 11/28/20 1100 11/29/20 0511  BP: 115/60 (!) 121/57 139/60 (!) 142/69  Pulse: 70 70 73 76  Resp: '17  18 20  '$ Temp: 98.1 F (36.7 C)   98.3 F (36.8 C)  TempSrc: Oral   Oral  SpO2: 100%  96% 98%  Weight:      Height:        Intake/Output Summary (Last 24 hours) at 11/29/2020 0723 Last data filed at 11/29/2020 0500 Gross per 24 hour  Intake 120 ml  Output 1650 ml  Net -1530 ml    Filed Weights   11/13/20 1900 11/13/20 2043  Weight: 82.6 kg 74.8 kg    Patient BMI: Body mass index is 29.23 kg/m.   Physical Exam: General: awake, alert, NAD HEENT: atraumatic, clear conjunctiva, anicteric sclera, moist mucus membranes, hearing grossly normal Respiratory: CTAB, no wheezes, rales or rhonchi, normal respiratory effort. Cardiovascular: normal S1/S2, RRR, no JVD, murmurs, rubs, gallops, quick capillary refill  Gastrointestinal: soft, NT, ND, no HSM felt, +bowel sounds. Nervous: A&O x3. no gross focal neurologic deficits, normal speech Extremities: moves all equally, no edema, normal tone Skin: dry, intact, normal  temperature, normal color Psychiatry: normal mood, congruent affect, judgement and insight appear normal  Labs   I have personally reviewed following labs and imaging studies  Recent Results (from the past 240 hour(s))  Resp Panel by RT-PCR (Flu A&B, Covid) Nasopharyngeal Swab     Status: Abnormal   Collection Time: 11/20/20  9:06 AM   Specimen: Nasopharyngeal Swab; Nasopharyngeal(NP) swabs in vial transport medium  Result Value Ref Range Status   SARS Coronavirus 2 by RT PCR POSITIVE (A) NEGATIVE Final    Comment: RESULT CALLED TO, READ BACK BY AND VERIFIED WITH: R,CROGHAN '@1111'$  11/20/20 EB (NOTE) SARS-CoV-2 target nucleic acids are DETECTED.  The SARS-CoV-2 RNA is generally detectable in upper respiratory specimens during the acute phase of infection. Positive results are indicative of the presence of the identified virus, but do not rule out bacterial infection or co-infection with other pathogens not detected by the test. Clinical correlation with patient history and other diagnostic information is necessary to determine patient infection status. The expected result is Negative.  Fact Sheet for Patients: EntrepreneurPulse.com.au  Fact Sheet for Healthcare Providers: IncredibleEmployment.be  This test is  not yet approved or cleared by the [place] and  has been authorized for detection and/or diagnosis of SARS-CoV-2 by FDA under an Emergency Use Authorization (EUA).  This EUA will remain in effect (meaning this test can be used) f or the duration of  the COVID-19 declaration under Section 564(b)(1) of the Act, 21 U.S.C. section 360bbb-3(b)(1), unless the authorization is terminated or revoked sooner.     Influenza A by PCR NEGATIVE NEGATIVE Final   Influenza B by PCR NEGATIVE NEGATIVE Final    Comment: (NOTE) The Xpert Xpress SARS-CoV-2/FLU/RSV plus assay is intended as an aid in the diagnosis of influenza from Nasopharyngeal  swab specimens and should not be used as a sole basis for treatment. Nasal washings and aspirates are unacceptable for Xpert Xpress SARS-CoV-2/FLU/RSV testing.  Fact Sheet for Patients: EntrepreneurPulse.com.au  Fact Sheet for Healthcare Providers: IncredibleEmployment.be  This test is not yet approved or cleared by the [place] FDA and has been authorized for detection and/or diagnosis of SARS-CoV-2 by FDA under an Emergency Use Authorization (EUA). This EUA will remain in effect (meaning this test can be used) for the duration of the COVID-19 declaration under Section 564(b)(1) of the Act, 21 U.S.C. section 360bbb-3(b)(1), unless the authorization is terminated or revoked.  Performed at [facility], [place] [address], [place], [place] [address]   Culture, blood (routine x 2)     Status: None   Collection Time: 11/22/20  4:45 PM   Specimen: BLOOD RIGHT HAND  Result Value Ref Range Status   Specimen Description BLOOD RIGHT HAND  Final   Special Requests   Final    BOTTLES DRAWN AEROBIC ONLY Blood Culture results may not be optimal due to an inadequate volume of blood received in culture bottles   Culture   Final    NO GROWTH 5 DAYS Performed at [facility], [place] [address], [place], [place] [address]    Report Status 11/27/2020 FINAL  Final  Culture, blood (routine x 2)     Status: None   Collection Time: 11/22/20  5:11 PM   Specimen: BLOOD RIGHT ARM  Result Value Ref Range Status   Specimen Description BLOOD RIGHT ARM  Final   Special Requests   Final    BOTTLES DRAWN AEROBIC AND ANAEROBIC Blood Culture results may not be optimal due to an inadequate volume of blood received in culture bottles   Culture   Final    NO GROWTH 5 DAYS Performed at [facility], [place] [address], [place], [place] [address]    Report Status 11/27/2020 FINAL  Final  Urine Culture     Status: Abnormal   Collection Time: 11/23/20  4:28 PM    Specimen: Urine, Clean Catch  Result Value Ref Range Status   Specimen Description URINE, CLEAN CATCH  Final   Special Requests NONE  Final   Culture (A)  Final    <10,000 COLONIES/mL INSIGNIFICANT GROWTH Performed at [facility], [place] [address], [place], [place] [address]    Report Status 11/25/2020 FINAL  Final     Imaging Studies  No results found. Medications   Scheduled Meds:   stroke: mapping our early stages of recovery book   Does not apply Once   allopurinol  300 mg Oral Daily   vitamin C  500 mg Oral Daily   aspirin EC  81 mg Oral Daily   atorvastatin  80 mg Oral Daily   baclofen  10 mg Oral BID   clopidogrel  75 mg Oral Daily   divalproex  500 mg Oral QHS   doxepin  10 mg Oral QHS   empagliflozin  25 mg Oral Daily   enoxaparin (LOVENOX) injection  40 mg Subcutaneous Q24H   escitalopram  10 mg Oral Daily   ezetimibe  10 mg Oral Daily   insulin glargine-yfgn  20 Units Subcutaneous QHS   Ipratropium-Albuterol  1 puff Inhalation Q6H   metoprolol succinate  12.5 mg Oral Daily   tamsulosin  0.4 mg Oral QPC supper   zinc sulfate  220 mg Oral Daily   Continuous Infusions:  sodium chloride 75 mL/hr at 11/28/20 0855     LOS: 15 days   Time spent: >47mn   Abigaile Rossie L Bralynn Velador, DO [facility] 11/29/2020, 7:23 AM   To contact the [facility]ending or Consulting provider for this patient: Check the care team in [facility]for a) attending/consulting [place]provider listed and b) the T[facility]team listed Log into [email or web address] and use [place]'s universal password to access. If you do not have the password, please contact the hospital operator. Locate the TOro Valley Hospitalprovider you are looking for under [facility] and page to a number that you can be directly reached. If you still have difficulty reaching the provider, please page the [facility](Director on Call) for the Hospitalists listed on amion for assistance.

## 2020-11-30 DIAGNOSIS — I639 Cerebral infarction, unspecified: Secondary | ICD-10-CM | POA: Diagnosis not present

## 2020-11-30 LAB — GLUCOSE, CAPILLARY
Glucose-Capillary: 149 mg/dL — ABNORMAL HIGH (ref 70–99)
Glucose-Capillary: 158 mg/dL — ABNORMAL HIGH (ref 70–99)
Glucose-Capillary: 208 mg/dL — ABNORMAL HIGH (ref 70–99)
Glucose-Capillary: 184 mg/dL — ABNORMAL HIGH (ref 70–99)

## 2020-11-30 LAB — BASIC METABOLIC PANEL
Anion gap: 12 (ref 5–15)
BUN: 11 mg/dL (ref 8–23)
CO2: 25 mmol/L (ref 22–32)
Calcium: 8.3 mg/dL — ABNORMAL LOW (ref 8.9–10.3)
Chloride: 104 mmol/L (ref 98–111)
Creatinine, Ser: 0.91 mg/dL (ref 0.61–1.24)
GFR, Estimated: >60 mL/min (ref >60)
Glucose, Bld: 195 mg/dL — ABNORMAL HIGH (ref 70–99)
Potassium: 3.5 mmol/L (ref 3.5–5.1)
Sodium: 141 mmol/L (ref 135–145)

## 2020-11-30 NOTE — Progress Notes (Signed)
Physical Therapy Treatment Patient Details Name: Fred [name] MRN: [id number] DOB: Mar 08, 1936 Today's Date: 11/30/2020   History of Present Illness Pt is an 85 y/o male admitted 9/2 secondary to new onset R weakness. Found to have R frontal lobe infarct. 9/3 rapid response contacted for confusion and disorientation. 9/9 COVID (+).  PMH includes HTN, DM, seizures, a fib, CVA with L sided weakness, CHF, and CAD.    PT Comments    Pt required +2 mod assist rolling and max assist supine to sit. Max assist to maintain sitting balance. Pt supine in bed at end of session, HOB at 45 degrees. Assisted with set up of breakfast tray. Current POC remains appropriate.    Recommendations for follow up therapy are one component of a multi-disciplinary discharge planning process, led by the attending physician.  Recommendations may be updated based on patient status, additional functional criteria and insurance authorization.  Follow Up Recommendations  SNF     Equipment Recommendations  None recommended by PT    Recommendations for Other Services       Precautions / Restrictions Precautions Precautions: Fall;Other (comment) Precaution Comments: left hemiplegia     Mobility  Bed Mobility Overal bed mobility: Needs Assistance Bed Mobility: Rolling;Sit to Supine Rolling: Mod assist;+2 for physical assistance   Supine to sit: Max assist;HOB elevated Sit to supine: Max assist   General bed mobility comments: +2 total assist scooting up in bed using bed pad    Transfers                    Ambulation/Gait             General Gait Details: Non-ambulatory at baseline   Stairs             Wheelchair Mobility    Modified Rankin (Stroke Patients Only) Modified Rankin (Stroke Patients Only) Pre-Morbid Rankin Score: Moderately severe disability Modified Rankin: Severe disability     Balance Overall balance assessment: Needs assistance Sitting-balance support:  No upper extremity supported;Feet supported Sitting balance-Leahy Scale: Zero Sitting balance - Comments: max/total assist to maintain sitting balance                                    Cognition Arousal/Alertness: Awake/alert Behavior During Therapy: WFL for tasks assessed/performed Overall Cognitive Status: Impaired/Different from baseline Area of Impairment: Attention;Safety/judgement;Problem solving                   Current Attention Level: Selective     Safety/Judgement: Decreased awareness of safety;Decreased awareness of deficits   Problem Solving: Difficulty sequencing;Requires verbal cues        Exercises      General Comments General comments (skin integrity, edema, etc.): VSS on RA      Pertinent Vitals/Pain Pain Assessment: No/denies pain    Home Living                      Prior Function            PT Goals (current goals can now be found in the care plan section) Acute Rehab PT Goals Patient Stated Goal: not stated Progress towards PT goals: Progressing toward goals    Frequency    Min 2X/week      PT Plan Current plan remains appropriate    Co-evaluation  AM-PAC PT "6 Clicks" Mobility   Outcome Measure  Help needed turning from your back to your side while in a flat bed without using bedrails?: A Lot Help needed moving from lying on your back to sitting on the side of a flat bed without using bedrails?: Total Help needed moving to and from a bed to a chair (including a wheelchair)?: Total Help needed standing up from a chair using your arms (e.g., wheelchair or bedside chair)?: Total Help needed to walk in hospital room?: Total Help needed climbing 3-5 steps with a railing? : Total 6 Click Score: 7    End of Session   Activity Tolerance: Patient tolerated treatment well Patient left: in bed;with call bell/phone within reach;with bed alarm set Nurse Communication: Mobility status PT  Visit Diagnosis: Muscle weakness (generalized) (M62.81);Other abnormalities of gait and mobility (R26.89);Unsteadiness on feet (R26.81)     Time: 5374-8270 PT Time Calculation (min) (ACUTE ONLY): 22 min  Charges:  $Therapeutic Activity: 8-22 mins                     Lorrin Goodell, PT  Office # [phone] Pager [phone]    Fred [name] 11/30/2020, 9:21 AM

## 2020-11-30 NOTE — TOC Progression Note (Signed)
Transition of Care [facility]) - Progression Note    Patient Details  Name: Fred [name] MRN: [id number] Date of Birth: 1935-10-28  [facility]) CM/SW Contact  Joanne Chars, LCSW Phone Number: 11/30/2020, 9:42 AM  Clinical Narrative:    CSW spoke with Beverlee Nims at [facility], confirmed that they will have a bed for pt tomorrow.   SNF [facility] is back in [place] and approved: Plan auth ID: [id number].  Ref: [id number] Approved 4 days 9/18-9/21.      Expected Discharge Plan: [place] Barriers to Discharge: Continued Medical Work up, SNF Pending bed offer  Expected Discharge Plan and Services Expected Discharge Plan: [place] Choice: [place] arrangements for the past 2 months: Single Family Home                                       Social Determinants of Health (SDOH) Interventions    Readmission Risk Interventions No flowsheet data found.

## 2020-11-30 NOTE — Plan of Care (Signed)
  Problem: Education: Goal: Knowledge of secondary prevention will improve Outcome: Progressing Goal: Knowledge of patient specific risk factors addressed and post discharge goals established will improve Outcome: Progressing   Problem: Coping: Goal: Will verbalize positive feelings about self Outcome: Progressing Goal: Will identify appropriate support needs Outcome: Progressing   Problem: Nutrition: Goal: Risk of aspiration will decrease Outcome: Progressing

## 2020-11-30 NOTE — Plan of Care (Signed)
  Problem: Activity: Goal: Risk for activity intolerance will decrease Outcome: Progressing   Problem: Education: Goal: Knowledge of disease or condition will improve Outcome: Progressing   Problem: Coping: Goal: Will verbalize positive feelings about self Outcome: Progressing Goal: Will identify appropriate support needs Outcome: Progressing   Problem: Nutrition: Goal: Risk of aspiration will decrease Outcome: Progressing Goal: Dietary intake will improve Outcome: Progressing

## 2020-11-30 NOTE — Progress Notes (Signed)
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  History is supplemented by patient's daughter at bedside.  Patient initially reported right-sided weakness beginning 2 days ago to the emergency provider.  During my conversation he says his right side has been feeling weak from exertion from overuse due to his chronic left-sided hemiplegia.  His family took him to a ball game to watch his grandchildren play earlier today.  While sitting down he was noted to start drifting towards his right as if he was about to fall over.  He then developed slurred incoherent speech with transient confusion.  EMS were called and per daughter's report he was also noted to have pinpoint pupils.   At time of admitting evaluation, patient's daughter feels that slurred speech is improving but still noticeable compared to his baseline speech.  Mental status has also improved.   Patient was previously on dual antiplatelet therapy with aspirin 81 mg and Plavix 75 mg daily.  Per conversation, he was recently taken off of Plavix and started on Eliquis 5 mg twice daily on 10/28/2020 by his cardiologist through the [place].  This was apparently started for A. fib stroke reduction.  Per patient and family, they are only aware of a brief episode of postoperative A. fib at the time of his CABG.  He states that he has not had any recent outpatient cardiac monitoring. Patient denies any personal history of blood clots.   In the ED, BP 132/65, pulse 72, RR 22,  temp 99.3 F, SPO2 95% on room air. Sodium 133, potassium 4.7, bicarb 24, BUN 16, creatinine 1.30, serum glucose 237, LFTs within normal limits.  WBC 9.4, hemoglobin 13.7, platelets 190,000.  SARS-CoV-2 PCR negative.  Influenza negative. CT head without contrast was negative for acute intracranial abnormality.  Chronic ischemic microangiopathy and generalized atrophy noted. MRI brain without contrast showed 2 punctate foci of acute ischemia within the subcortical right frontal lobe, 1 of which is along the right precentral gyrus.  No hemorrhage or mass-effect seen.  Advanced white matter disease most consistent with chronic small vessel ischemia noted. Neurology consulted and recommended medical admission for further stroke work-up.  The hospitalist service was consulted to admit for further management.   Assessment & Plan:   Principal Problem:   Acute CVA (cerebrovascular accident) ([place]) Active Problems:   Hypertension associated with diabetes ([place])   Coronary atherosclerosis   Type 2 diabetes mellitus with complication, with long-term current use of insulin ([place])   AKI (acute kidney injury) ([place])   Hyperlipidemia associated with type 2 diabetes mellitus ([place])   Seizure disorder ([place])   Depression with anxiety   COVID   Acute subcortical right frontal lobe ischemic infarcts History of prior CVA with residual left hemiplegia: Patient presenting to the ED following right-sided weakness.  MR brain with acute infarct within subcortical right frontal lobe without hemorrhage/mass-effect.  MRA unrevealing.  CT angio neck with cervical carotid atherosclerosis with 60% narrowing left ICA, 60% bilateral vertebral origin stenosis.  TTE with LVEF 60 to [id number], grade 1 diastolic dysfunction, no aortic stenosis, IVC normal in size.  EEG with no epileptiform/seizure activity noted.  LDL 52, hemoglobin A1c 7.8 neurology was consulted and followed initially during the hospital course.  Outpatient follow-up 4 weeks  with [facility], Dr. Delice Lesch. --Plavix 75 mg daily and aspirin 81 mg  --Atorvastatin 80 mg daily --Continue therapy efforts while inpatient --Pending SNF placement  COVID-19 viral infection: Incidentally found to be COVID-19 positive on 11/20/2020.  Completed 5-day course of remdesivir on 11/24/2020.  Oxygenating well on room air. --Continue zinc, vitamin C --Combivent MDI q6h PRN wheezing/shortness of breath --continue airborne/contact isolation until 12/01/2020  Left ICA stenosis Bilateral vertebral artery stenosis Noted on CTA with 60% narrowing left ICA, 60% bilateral vertebral origin stenosis. --Outpatient follow-up with vascular surgery   Paroxysmal atrial fibrillation: Patient recently started on Eliquis 5 mg twice daily by his [place] cardiologist apparently for A. fib stroke risk reduction.  No prior A. fib episodes reported in our system other than a single episode in the postop setting after his CABG.  Sinus rhythm on admission with controlled rate. --Eliquis discontinued due in favor of dual antiplatelet therapy as above --Metoprolol succinate 12.5 mg p.o. daily   Left lower extremity swelling: Vascular duplex ultrasound negative for DVT, likely dependent edema    Acute kidney injury: Resolved Renal ultrasound with no mass/hydronephrosis, otherwise unrevealing.  Improved with IV fluid hydration. Cr 1.30>>0.97>0.91 --Avoid nephrotoxins, renally dose all medication  Acute urinary retention Patient required in and out catheterization on 11/28/2020.  Etiology likely secondary to BPH versus medication side effect with doxepin. --Strict I's and O's, close monitoring of urinary output --Continue Flomax  Hypomagnesemia: Resolved Repleted during hospitalization.   Hypertension: Home regimen includes losartan 25 mg p.o. daily, metoprolol succinate 12.5 mg p.o. daily. --Metoprolol succinate 12.5 mg p.o. daily --Losartan '25mg'$  PO daily   Insulin-dependent type 2 diabetes: Home  regimen includes Toujeo 22 units nightly, Jardiance and linagliptin.  Hemoglobin A1c 1.8 on 11/14/2020. --Semglee 24u Gonzalez qHS --Novolog 3u Gutierrez TIDAC --Jardiance '25mg'$  PO daily --SSI for further coverage --CBGs qAC/HS   CAD s/p CABG: --Continue aspirin, atorvastatin, Zetia.   Seizure disorder: EEG with no epileptiform/seizure activity. --Continue Depakote 500 mg p.o. nightly.   Hyperlipidemia: --Continue atorvastatin, Zetia.   Depression/anxiety: --Doxepin decreased to '10mg'$  PO daily 2/2 urinary retention as above  --Lexapro '10mg'$  PO daily  Hx Gout --Continue allopurinol 300 mg p.o. daily   DVT prophylaxis: enoxaparin (LOVENOX) injection 40 mg Start: 11/17/20 1200   Code Status: Full Code Family Communication: No family present at bedside this morning  Disposition Plan:  Level of care: Telemetry Medical Status is: Inpatient  Remains inpatient appropriate because:Unsafe d/c plan and Inpatient level of care appropriate due to severity of illness  Dispo:  Patient From:    Planned Disposition: [place]  Medically stable for discharge:   but awaiting 10 day quarantine/isolation.  Before discharge     Consultants:  Neurology  Procedures:  TTE Vascular duplex ultrasound lower extremity EEG  Antimicrobials:  None   Subjective: Patient seen examined at bedside, resting comfortably.  No complaints this morning.  Plan for discharge to SNF tomorrow once contact/airborne isolation precautions are lifted. Denies headache, no dizziness, no chest pain, no palpitations, no shortness of breath, no abdominal pain, no weakness, no fatigue, no paresthesias.  No acute events overnight per nursing staff.  Objective: Vitals:   11/29/20 0930 11/29/20 1234 11/29/20 2007 11/30/20 0901  BP: (!) 134/54 (!) 130/56 138/74 (!) 152/85  Pulse: 66 76 78 88  Resp: 19 20  20 20  Temp: 98 F (36.7 C) 97.8 F (36.6 C) 99.9 F (37.7 C) 98.1 F (36.7 C)  TempSrc: Oral Oral Oral Oral   SpO2: 95% 97% 98% 96%  Weight:      Height:        Intake/Output Summary (Last 24 hours) at 11/30/2020 0940 Last data filed at 11/30/2020 0900 Gross per 24 hour  Intake 3110.9 ml  Output 2300 ml  Net 810.9 ml   Filed Weights   11/13/20 1900 11/13/20 2043  Weight: 82.6 kg 74.8 kg    Examination:  General exam: Appears calm and comfortable, elderly in appearance. Respiratory system: Clear to auscultation. Respiratory effort normal. Cardiovascular system: S1 & S2 heard, RRR. No JVD, murmurs, rubs, gallops or clicks.  Trace lower extremity edema bilaterally. Gastrointestinal system: Abdomen is nondistended, soft and nontender. No organomegaly or masses felt. Normal bowel sounds heard. Central nervous system: Alert and oriented. No focal neurological deficits. Extremities: Symmetric 5 x 5 power. Skin: No rashes, lesions or ulcers Psychiatry: Judgement and insight appear normal. Mood & affect appropriate.     Data Reviewed: I have personally reviewed following labs and imaging studies  CBC: Recent Labs  Lab 11/24/20 0514 11/25/20 0148 11/27/20 0152  WBC 8.1 6.1 7.2  NEUTROABS 4.2 2.6  --   HGB 12.2* 11.5* 12.3*  HCT 35.6* 34.5* 36.6*  MCV 95.2 96.4 96.1  PLT 210 228 [id number]   Basic Metabolic Panel: Recent Labs  Lab 11/24/20 0514 11/25/20 0148 11/27/20 0152 11/28/20 0143 11/29/20 0144 11/30/20 0413  NA 137 138 141 140 142 141  K 3.6 3.9 3.6 3.7 3.6 3.5  CL 105 109 106 106 109 104  CO2 '23 24 25 25 26 25  '$ GLUCOSE 102* 127* 80 130* 178* 195*  BUN 29* 27* '14 13 14 11  '$ CREATININE 1.11 0.97 0.71 0.90 0.99 0.91  CALCIUM 8.2* 8.0* 8.6* 8.4* 8.4* 8.3*  MG 1.7 2.0 1.6* 1.7  --   --   PHOS 2.7 2.3* 2.3* 2.5  --   --    GFR: Estimated Creatinine Clearance: 55.8 mL/min (by C-G formula based on SCr of 0.91 mg/dL). Liver Function Tests: Recent Labs  Lab 11/24/20 0514 11/25/20 0148  AST 27 25  ALT 26 24  ALKPHOS 41 39  BILITOT 0.6 0.6  PROT 5.4* 5.3*  ALBUMIN 2.3*  2.2*   No results for input(s): LIPASE, AMYLASE in the last 168 hours. No results for input(s): AMMONIA in the last 168 hours. Coagulation Profile: No results for input(s): INR, PROTIME in the last 168 hours. Cardiac Enzymes: No results for input(s): CKTOTAL, CKMB, CKMBINDEX, TROPONINI in the last 168 hours. BNP (last 3 results) No results for input(s): PROBNP in the last 8760 hours. HbA1C: No results for input(s): HGBA1C in the last 72 hours. CBG: Recent Labs  Lab 11/28/20 2123 11/29/20 0910 11/29/20 1232 11/29/20 1622 11/30/20 0801  GLUCAP 175* 123* 177* 160* 149*   Lipid Profile: No results for input(s): CHOL, HDL, LDLCALC, TRIG, CHOLHDL, LDLDIRECT in the last 72 hours. Thyroid Function Tests: No results for input(s): TSH, T4TOTAL, FREET4, T3FREE, THYROIDAB in the last 72 hours. Anemia Panel: No results for input(s): VITAMINB12, FOLATE, FERRITIN, TIBC, IRON, RETICCTPCT in the last 72 hours.  Sepsis Labs: Recent Labs  Lab 11/24/20 0514 11/24/20 0840 11/25/20 0148  PROCALCITON 28.50 <0.10 <0.10    Recent Results (from the past 240 hour(s))  Culture, blood (routine x 2)     Status: None   Collection  Time: 11/22/20  4:45 PM   Specimen: BLOOD RIGHT HAND  Result Value Ref Range Status   Specimen Description BLOOD RIGHT HAND  Final   Special Requests   Final    BOTTLES DRAWN AEROBIC ONLY Blood Culture results may not be optimal due to an inadequate volume of blood received in culture bottles   Culture   Final    NO GROWTH 5 DAYS Performed at [facility], [place] [address] [place], [place] [address]    Report Status 11/27/2020 FINAL  Final  Culture, blood (routine x 2)     Status: None   Collection Time: 11/22/20  5:11 PM   Specimen: BLOOD RIGHT ARM  Result Value Ref Range Status   Specimen Description BLOOD RIGHT ARM  Final   Special Requests   Final    BOTTLES DRAWN AEROBIC AND ANAEROBIC Blood Culture results may not be optimal due to an inadequate volume of  blood received in culture bottles   Culture   Final    NO GROWTH 5 DAYS Performed at [facility], [place] [address], [place], [place] [address]    Report Status 11/27/2020 FINAL  Final  Urine Culture     Status: Abnormal   Collection Time: 11/23/20  4:28 PM   Specimen: Urine, Clean Catch  Result Value Ref Range Status   Specimen Description URINE, CLEAN CATCH  Final   Special Requests NONE  Final   Culture (A)  Final    <10,000 COLONIES/mL INSIGNIFICANT GROWTH Performed at [facility], [place] [address], [place],  [address]    Report Status 11/25/2020 FINAL  Final         Radiology Studies: No results found.      Scheduled Meds:   stroke: mapping our early stages of recovery book   Does not apply Once   allopurinol  300 mg Oral Daily   vitamin C  500 mg Oral Daily   aspirin EC  81 mg Oral Daily   atorvastatin  80 mg Oral Daily   baclofen  10 mg Oral BID   clopidogrel  75 mg Oral Daily   divalproex  500 mg Oral QHS   doxepin  10 mg Oral QHS   empagliflozin  25 mg Oral Daily   enoxaparin (LOVENOX) injection  40 mg Subcutaneous Q24H   escitalopram  10 mg Oral Daily   ezetimibe  10 mg Oral Daily   fluticasone  2 spray Each Nare Daily   insulin glargine-yfgn  20 Units Subcutaneous QHS   Ipratropium-Albuterol  1 puff Inhalation Q6H   ipratropium-albuterol  3 mL Nebulization Once   losartan  25 mg Oral Daily   metoprolol succinate  12.5 mg Oral Daily   polyethylene glycol  17 g Oral BID   tamsulosin  0.4 mg Oral QPC supper   zinc sulfate  220 mg Oral Daily   Continuous Infusions:  sodium chloride 75 mL/hr at 11/29/20 2325     LOS: 16 days    Time spent: 38 minutes spent on chart review, discussion with nursing staff, consultants, updating family and interview/physical exam; more than 50% of that time was spent in counseling and/or coordination of care.    Shawnique Mariotti J [place] DO [facility] Available via Epic secure chat 7am-7pm After  these hours, please refer to coverage provider listed on [email or web address] 11/30/2020, 9:40 AM

## 2020-12-01 DIAGNOSIS — I639 Cerebral infarction, unspecified: Secondary | ICD-10-CM | POA: Diagnosis not present

## 2020-12-01 LAB — GLUCOSE, CAPILLARY
Glucose-Capillary: 71 mg/dL (ref 70–99)
Glucose-Capillary: 91 mg/dL (ref 70–99)
Glucose-Capillary: 118 mg/dL — ABNORMAL HIGH (ref 70–99)

## 2020-12-01 MED ORDER — TAMSULOSIN HCL 0.4 MG PO CAPS: 0.4000 mg | ORAL_CAPSULE | Freq: Every day | ORAL | Status: DC

## 2020-12-01 MED ORDER — IPRATROPIUM-ALBUTEROL 20-100 MCG/ACT IN AERS: 1.0000 | INHALATION_SPRAY | Freq: Four times a day (QID) | RESPIRATORY_TRACT | Status: DC | PRN

## 2020-12-01 MED ORDER — DOXEPIN HCL 10 MG PO CAPS: 10.0000 mg | ORAL_CAPSULE | Freq: Every day | ORAL | Status: AC

## 2020-12-01 MED ORDER — CLOPIDOGREL BISULFATE 75 MG PO TABS: 75.0000 mg | ORAL_TABLET | Freq: Every day | ORAL | Status: AC

## 2020-12-01 MED ORDER — METOPROLOL SUCCINATE ER 25 MG PO TB24: 12.5000 mg | ORAL_TABLET | Freq: Every day | ORAL | Status: DC

## 2020-12-01 NOTE — TOC Transition Note (Signed)
Transition of Care [facility]) - CM/SW Discharge Note   Patient Details  Name: Fred [name] MRN: [id number] Date of Birth: July 09, 1935  [facility]) CM/SW Contact:  Joanne Chars, LCSW Phone Number: 12/01/2020, 12:09 PM   Clinical Narrative:  Pt discharging to [place], [place].  RN call report to [phone].   1100: CSW spoke with Beverlee Nims at [facility], confirmed transfer for today.  DC summary faxed to [phone].  Daughter Fred [name] in room, aware of DC plans, had question regarding pt breathing status, RN notified and will talk with daughter.    Final next level of care: Skilled Nursing Facility Barriers to Discharge: Barriers Resolved   Patient Goals and CMS Choice   CMS Medicare.gov Compare Post Acute Care list provided to:: Patient Represenative (must comment) Choice offered to / list presented to : Adult Children  Discharge Placement              Patient chooses bed at:  ([facility]) Patient to be transferred to facility by: [place] Name of family member notified: daughter Fred [name] in room Patient and family notified of of transfer: 12/01/20  Discharge Plan and Services     Post Acute Care Choice: [facility]                               Social Determinants of Health (SDOH) Interventions     Readmission Risk Interventions No flowsheet data found.

## 2020-12-01 NOTE — Discharge Summary (Signed)
Physician Discharge Summary  Fred [name] QIW:[id number] DOB: 07/09/1935 DOA: 11/13/2020  PCP: [name], MD  Admit date: 11/13/2020 Discharge date: 12/01/2020  Admitted From: Home Disposition: [facility]  Recommendations for Outpatient Follow-up:  Follow up with PCP in 1-2 weeks Follow-up with low [facility] neurology, Dr. 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  History is supplemented by patient's daughter at bedside.  Patient initially reported right-sided weakness beginning 2 days ago to the emergency provider.  During my conversation he says his right side has been feeling weak from exertion from overuse due to his chronic left-sided hemiplegia.  His family took him to a ball game to watch his grandchildren play earlier today.  While sitting down he was noted to start drifting towards his right as if he was about to fall over.  He then developed slurred incoherent speech with transient confusion.  EMS were called and per daughter's report he was also noted to have pinpoint pupils.   At time of admitting evaluation, patient's daughter feels that slurred speech is improving but still noticeable compared to his baseline speech.  Mental status has also improved.   Patient was previously on dual antiplatelet  therapy with aspirin 81 mg and Plavix 75 mg daily.  Per conversation, he was recently taken off of Plavix and started on Eliquis 5 mg twice daily on 10/28/2020 by his cardiologist through the [place].  This was apparently started for A. fib stroke reduction.  Per patient and family, they are only aware of a brief episode of postoperative A. fib at the time of his CABG.  He states that he has not had any recent outpatient cardiac monitoring. Patient denies any personal history of blood clots.   In the ED, BP 132/65, pulse 72, RR 22, temp 99.3 F, SPO2 95% on room air. Sodium 133, potassium 4.7, bicarb 24, BUN 16, creatinine 1.30, serum glucose 237, LFTs within normal limits.  WBC 9.4, hemoglobin 13.7, platelets 190,000.  SARS-CoV-2 PCR negative.  Influenza negative. CT head without contrast was negative for acute intracranial abnormality.  Chronic ischemic microangiopathy and generalized atrophy noted. MRI brain without contrast showed 2 punctate foci of acute ischemia within the subcortical right frontal lobe, 1 of which is along the right precentral gyrus.  No hemorrhage or mass-effect seen.  Advanced white matter disease most consistent with chronic small vessel ischemia noted. Neurology consulted and recommended medical admission for further stroke work-up.  The hospitalist service was consulted to admit for further management.  Hospital course:  Acute subcortical right frontal lobe ischemic infarcts History of prior CVA with residual left hemiplegia: Patient presenting to the ED following right-sided weakness.  MR brain with acute infarct within subcortical right frontal lobe without hemorrhage/mass-effect.  MRA unrevealing.  CT angio neck with cervical carotid atherosclerosis with 60% narrowing left ICA, 60% bilateral vertebral origin stenosis.  TTE with LVEF 60 to 41%, grade 1 diastolic dysfunction, no aortic stenosis, IVC  normal in size.  EEG with no epileptiform/seizure activity noted.  LDL 52, hemoglobin  A1c 7.8 neurology was consulted and followed initially during the hospital course.  Continue dual antiplatelet therapy with Plavix 75 mg p.o. daily and aspirin 81 mg p.o. daily.  Atorvastatin 80 mg p.o. daily.  Outpatient follow-up 4 weeks with [facility], Dr. Delice Lesch.  Discharging to SNF for further rehabilitation.   COVID-19 viral infection: Incidentally found to be COVID-19 positive on 11/20/2020.  Completed 5-day course of remdesivir on 11/24/2020.  Oxygenating well on room air.  Completed 10-day isolation period while inpatient.   Left ICA stenosis Bilateral vertebral artery stenosis Noted on CTA with 60% narrowing left ICA, 60% bilateral vertebral origin stenosis. Outpatient follow-up with vascular surgery   Paroxysmal atrial fibrillation: Patient recently started on Eliquis 5 mg twice daily by his [place] cardiologist apparently for A. fib stroke risk reduction.  No prior A. fib episodes reported in our system other than a single episode in the postop setting after his CABG.  Sinus rhythm on admission with controlled rate. Eliquis discontinued due in favor of dual antiplatelet therapy as above.  Continue metoprolol succinate 12.5 mg p.o. daily   Left lower extremity swelling: Vascular duplex ultrasound negative for DVT, likely dependent edema    Acute kidney injury: Resolved Creatinine 1.30 on admission.  Renal ultrasound with no mass/hydronephrosis, otherwise unrevealing.  Improved with IV fluid hydration.  Creatinine 0.91 at time of discharge.   Acute urinary retention Patient required in and out catheterization on 11/28/2020.  Etiology likely secondary to BPH versus medication side effect with doxepin.  Started on tamsulosin.  Decrease doxepin dose to 10 mg p.o. daily.  Hypomagnesemia: Resolved Repleted during hospitalization.   Hypertension: Losartan 25 mg p.o. daily, metoprolol succinate 12.5 mg p.o. daily.   Insulin-dependent type 2 diabetes: Continue home Toujeo 22 units  nightly, Jardiance and linagliptin.  Hemoglobin A1c 1.8 on 11/14/2020.   CAD s/p CABG: Continue aspirin, atorvastatin, Zetia.   Seizure disorder: EEG with no epileptiform/seizure activity. Continue Depakote 500 mg p.o. nightly.   Hyperlipidemia: Continue atorvastatin, Zetia.   Depression/anxiety: Doxepin decreased to $RemoveBefo'10mg'RUxPnaRPUGh$  PO daily 2/2 urinary retention as above. Lexapro $RemoveBefore'10mg'YxMdlavqgdIDV$  PO daily   Hx Gout: Continue allopurinol 300 mg p.o. daily  Discharge Diagnoses:  Principal Problem:   Acute CVA (cerebrovascular accident) ([place]) Active Problems:   Hypertension associated with diabetes ([place])   Coronary atherosclerosis   Type 2 diabetes mellitus with complication, with long-term current use of insulin (H[facility])   AKI (acute kidney injury) ([place])   Hyperlipidemia associated with type 2 diabetes mellitus ([place])   Seizure disorder ([place])   Depression with anxiety   COVID    Discharge Instructions  Discharge Instructions     Ambulatory referral to Neurology   Complete by: As directed    Follow up with Dr. Delice Lesch at [facility] in 4 weeks. Pt is Dr. Amparo Bristol pt. Thanks.   Ambulatory referral to Vascular Surgery   Complete by: As directed    Left carotid stenosis, 70%. Thanks.   Call MD for:  difficulty breathing, headache or visual disturbances   Complete by: As directed    Call MD for:  extreme fatigue   Complete by: As directed    Call MD for:  persistant dizziness or light-headedness   Complete by: As directed    Call MD for:  persistant nausea and vomiting   Complete by: As directed    Call MD for:  severe uncontrolled pain   Complete  by: As directed    Call MD for:  temperature >100.4   Complete by: As directed    Diet - low sodium heart healthy   Complete by: As directed    Increase activity slowly   Complete by: As directed       Allergies as of 12/01/2020       Reactions   Penicillins Anaphylaxis   Lisinopril Cough        Medication List     STOP taking these medications     Eliquis 5 MG Tabs tablet Generic drug: apixaban       TAKE these medications    acetaminophen 325 MG tablet Commonly known as: TYLENOL Take 2 tablets (650 mg total) by mouth every 4 (four) hours as needed for mild pain (or temp > 37.5 C (99.5 F)).   allopurinol 300 MG tablet Commonly known as: ZYLOPRIM Take 1 tablet (300 mg total) by mouth daily.   aspirin EC 81 MG EC tablet Generic drug: aspirin Take 81 mg by mouth daily.   atorvastatin 80 MG tablet Commonly known as: LIPITOR Take 1 tablet (80 mg total) by mouth daily.   baclofen 10 MG tablet Commonly known as: LIORESAL Take 10 mg by mouth 2 (two) times daily.   blood glucose meter kit and supplies Check blood sugar 3 times daily.   clopidogrel 75 MG tablet Commonly known as: PLAVIX Take 1 tablet (75 mg total) by mouth daily. Start taking on: December 02, 2020   CoQ10 100 MG Caps Take 300 mg by mouth daily.   divalproex 500 MG 24 hr tablet Commonly known as: DEPAKOTE ER TAKE 1 TABLET(500 MG) BY MOUTH DAILY What changed: See the new instructions.   doxepin 10 MG capsule Commonly known as: SINEQUAN Take 1 capsule (10 mg total) by mouth at bedtime. What changed:  medication strength how much to take   escitalopram 10 MG tablet Commonly known as: LEXAPRO Take 10 mg by mouth daily.   ezetimibe 10 MG tablet Commonly known as: ZETIA Take 1 tablet (10 mg total) by mouth daily.   Jardiance 10 MG Tabs tablet Generic drug: empagliflozin TAKE 1 TABLET(10 MG) BY MOUTH DAILY BEFORE BREAKFAST What changed: See the new instructions.   linagliptin 5 MG Tabs tablet Commonly known as: Tradjenta Take 1 tablet (5 mg total) by mouth daily.   losartan 25 MG tablet Commonly known as: COZAAR Take 1 tablet (25 mg total) by mouth daily.   Magnesium Oxide 420 MG Tabs Take 420 mg by mouth daily.   metoprolol succinate 25 MG 24 hr tablet Commonly known as: TOPROL-XL Take 0.5 tablets (12.5 mg total) by mouth  daily. Start taking on: December 02, 2020 What changed:  medication strength how much to take additional instructions   niacin 500 MG CR tablet Commonly known as: NIASPAN TAKE 1 TABLET(500 MG) BY MOUTH AT BEDTIME What changed: See the new instructions.   Novofine Pen Needle 32G X 6 MM Misc Generic drug: Insulin Pen Needle INJECT INSULIN DAILY   ONE TOUCH ULTRA TEST test strip Generic drug: glucose blood   OneTouch Delica Plus [id number] Misc   senna-docusate 8.6-50 MG tablet Commonly known as: Senokot-S Take 1 tablet by mouth 2 (two) times daily. What changed:  when to take this reasons to take this   tamsulosin 0.4 MG Caps capsule Commonly known as: FLOMAX Take 1 capsule (0.4 mg total) by mouth daily after supper.   Toujeo SoloStar 300 UNIT/ML Solostar Pen Generic drug: insulin  glargine (1 Unit Dial) Inject 22 Units into the skin at bedtime.   Vitamin D 50 MCG (2000 UT) tablet Take 2,000 Units by mouth daily.        Follow-up Information     Cameron Sprang, MD. Schedule an appointment as soon as possible for a visit in 1 month(s).   Specialty: Neurology Contact information: [address] [place] [address] [phone]         [name], MD. Schedule an appointment as soon as possible for a visit in 1 week(s).   Specialty: Family Medicine Contact information: [address] [place] [address] [phone]                Allergies  Allergen Reactions   Penicillins Anaphylaxis   Lisinopril Cough    Consultations: Neurology   Procedures/Studies: CT ANGIO NECK W OR WO CONTRAST  Result Date: 11/14/2020 CLINICAL DATA:  Stroke workup EXAM: CT ANGIOGRAPHY NECK TECHNIQUE: Multidetector CT imaging of the neck was performed using the standard protocol during bolus administration of intravenous contrast. Multiplanar CT image reconstructions and MIPs were obtained to evaluate the vascular anatomy.  Carotid stenosis measurements (when applicable) are obtained utilizing NASCET criteria, using the distal internal carotid diameter as the denominator. CONTRAST:  36mL OMNIPAQUE IOHEXOL 350 MG/ML SOLN COMPARISON:  Brain MRI and MRA from yesterday and today respectively FINDINGS: Aortic arch: Atheromatous plaque.  Prior CABG. Right carotid system: Diffuse atherosclerosis that diffuse intermittent atheromatous plaque of the common carotid and proximal ICA. No flow limiting stenosis of 50% or greater. Moderate plaque irregularity without dissection or clear ulceration. Left carotid system: Atheromatous plaque diffusely involving the common carotid which is small compared to the right. Extensive plaque at the bifurcation and proximal ICA with proximal ICA stenosis measuring 60%. No dissection. ICA tortuosity with looping below the skull base. Vertebral arteries: No proximal subclavian stenosis of hemodynamic significance. Calcified plaque at both vertebral origins with 60% stenosis on both sides as measured on coronal reformats. Negative for dissection or beading. Skeleton: Cervical spine degeneration with reversed lordosis and C4-5, C5-6 and mild anterolisthesis. Other neck: No acute finding Upper chest: Airway thickening IMPRESSION: 1. Cervical carotid atherosclerosis with 60% narrowing at the left ICA origin. 2. 60% bilateral vertebral origin stenosis. Electronically Signed   By: Monte Fantasia M.D.   On: 11/14/2020 11:44   MR ANGIO HEAD WO CONTRAST  Result Date: 11/14/2020 CLINICAL DATA:  Stroke follow-up EXAM: MRA HEAD WITHOUT CONTRAST TECHNIQUE: Angiographic images of the Circle of Willis were acquired using MRA technique without intravenous contrast. COMPARISON:  No pertinent prior exam. FINDINGS: POSTERIOR CIRCULATION: --Vertebral arteries: Normal --Inferior cerebellar arteries: Normal. --Basilar artery: Normal. --Superior cerebellar arteries: Normal. --Posterior cerebral arteries: Normal. ANTERIOR  CIRCULATION: --Intracranial internal carotid arteries: Normal. --Anterior cerebral arteries (ACA): Normal. --Middle cerebral arteries (MCA): Normal. ANATOMIC VARIANTS: None IMPRESSION: Normal intracranial MRA. Electronically Signed   By: Ulyses Jarred M.D.   On: 11/14/2020 01:54   MR BRAIN WO CONTRAST  Result Date: 11/13/2020 CLINICAL DATA:  Right-sided weakness EXAM: MRI HEAD WITHOUT CONTRAST TECHNIQUE: Multiplanar, multiecho pulse sequences of the brain and surrounding structures were obtained without intravenous contrast. COMPARISON:  None. FINDINGS: Brain: There are 2 punctate foci of abnormal diffusion restriction within the subcortical right frontal lobe, 1 of which is along the right precentral gyrus. There is diffuse, severe atrophy. There is advanced white matter disease most consistent with chronic small vessel ischemia. There is no acute  or chronic hemorrhage. Vascular: Normal flow voids. Skull and upper cervical spine: Normal marrow signal. Sinuses/Orbits: Negative. Other: None. IMPRESSION: 1. Two punctate foci of acute ischemia within the subcortical right frontal lobe, 1 of which is along the right precentral gyrus. 2. No hemorrhage or mass effect. 3. Advanced white matter disease most consistent with chronic small vessel ischemia. Electronically Signed   By: Ulyses Jarred M.D.   On: 11/13/2020 20:52   US RENAL  Result Date: 11/23/2020 CLINICAL DATA:  Acute kidney injury. EXAM: RENAL / URINARY TRACT ULTRASOUND COMPLETE COMPARISON:  None. FINDINGS: Right Kidney: Renal measurements: 9.3 cm x 4.4 cm x 5.4 cm = volume: 115.9 mL. Echogenicity within normal limits. No mass or hydronephrosis visualized. Left Kidney: Renal measurements: 10.4 cm x 5.5 cm x 5.6 cm = volume: 168.8 mL. Echogenicity within normal limits. No mass or hydronephrosis visualized. Bladder: Appears normal for degree of bladder distention. Other: None. IMPRESSION: Normal renal ultrasound. Electronically Signed   By: Virgina Norfolk M.D.   On: 11/23/2020 19:57   DG CHEST PORT 1 VIEW  Result Date: 11/24/2020 CLINICAL DATA:  Shortness of breath EXAM: PORTABLE CHEST 1 VIEW COMPARISON:  11/23/2020 FINDINGS: Low lung volumes. Mild atelectasis at the left lung base. Chronic interstitial changes. Similar cardiomediastinal contours. IMPRESSION: No acute process.  Stable lung aeration. Electronically Signed   By: Macy Mis M.D.   On: 11/24/2020 09:25   DG CHEST PORT 1 VIEW  Result Date: 11/23/2020 CLINICAL DATA:  Dyspnea, atrial fibrillation EXAM: PORTABLE CHEST 1 VIEW COMPARISON:  11/22/2020 FINDINGS: Lung volumes are small, but are symmetric and are stable since prior examination. Mild left basilar atelectasis. No pneumothorax or pleural effusion. Coronary artery bypass grafting has been performed. Cardiac size is within normal limits when accounting for poor pulmonary insufflation. Vascular crowding is noted at the hila secondary to poor pulmonary insufflation. No acute bone abnormality. IMPRESSION: Stable pulmonary hypoinflation. Electronically Signed   By: Fidela Salisbury M.D.   On: 11/23/2020 09:24   DG CHEST PORT 1 VIEW  Result Date: 11/22/2020 CLINICAL DATA:  Shortness of breath EXAM: PORTABLE CHEST 1 VIEW COMPARISON:  November 21, 2020 FINDINGS: The heart size and mediastinal contours are stable. Mild interstitial opacities are identified in bilateral lungs unchanged. There is probable minimal left pleural effusion. The visualized skeletal structures are stable. IMPRESSION: Mild interstitial opacities are identified in bilateral lungs unchanged. Electronically Signed   By: Abelardo Diesel M.D.   On: 11/22/2020 08:43   DG CHEST PORT 1 VIEW  Result Date: 11/21/2020 CLINICAL DATA:  85 year old male with previous stroke EXAM: PORTABLE CHEST 1 VIEW COMPARISON:  11/20/2020 FINDINGS: Cardiomediastinal silhouette unchanged in size and contour. Surgical changes of median sternotomy and CABG. Calcifications of the aortic arch.  Similar appearance of low lung volumes with predominantly reticular opacities of the lungs. No pneumothorax. No large pleural effusion. Similar appearance of hazy opacity at the left costophrenic angle. IMPRESSION: Unchanged appearance of the chest x-ray with low lung volumes and predominantly interstitial disease. Surgical changes of median sternotomy and CABG. Electronically Signed   By: Corrie Mckusick D.O.   On: 11/21/2020 10:27   DG CHEST PORT 1 VIEW  Result Date: 11/20/2020 CLINICAL DATA:  Fall.  Positive COVID test. EXAM: PORTABLE CHEST 1 VIEW COMPARISON:  08/01/2019 FINDINGS: Previous median sternotomy and CABG procedure. No pleural effusion or edema. No airspace densities identified. Lung volumes are low. Subsegmental atelectasis noted within the left base. Curvature of the lumbar scratch set curvature of  the thoracic spine appears convex towards the left. IMPRESSION: 1. Low lung volumes.  Left lung base atelectasis. Electronically Signed   By: Kerby Moors M.D.   On: 11/20/2020 15:35   EEG adult  Result Date: 11/14/2020 Greta Doom, MD     11/14/2020  2:07 PM History: 85 yo M With right sided weakness and confusion Sedation: none Technique: This EEG was acquired with electrodes placed according to the International 10-20 electrode system (including Fp1, Fp2, F3, F4, C3, C4, P3, P4, O1, O2, T3, T4, T5, T6, A1, A2, Fz, Cz, Pz). The following electrodes were missing or displaced: none. Background: The background consists of intermixed generalized irregular delta and theta range activities.  There is no definite posterior dominant rhythm seen.  There are occasional brief runs of beta range activity consistent with spindles.  There are times where there is slightly faster underlying background, but no definite PDR. Photic stimulation: Physiologic driving is not performed EEG Abnormalities: 1) lack of waking recording Clinical Interpretation: This EEG was recorded in the drowsy and sleeping state,  the lack of waking state limits the ability to assess for encephalopathy. There was no seizure or seizure predisposition recorded on this study. Please note that lack of epileptiform activity on EEG does not preclude the possibility of epilepsy. Roland Rack, MD [facility] [phone] If 7pm- 7am, please page neurology on call as listed in [place].   ECHOCARDIOGRAM COMPLETE  Result Date: 11/14/2020    ECHOCARDIOGRAM REPORT   Patient Name:   Fred [name] [facility] Date of Exam: 11/14/2020 Medical Rec #:  [id number]        Height:       63.0 in Accession #:    [id number]       Weight:       165.0 lb Date of Birth:  1935-08-27        BSA:          1.782 m Patient Age:    4 years         BP:           122/67 mmHg Patient Gender: M                HR:           64 bpm. Exam Location:  Inpatient Procedure: 2D Echo, Cardiac Doppler and Color Doppler Indications:    Stroke  History:        Patient has prior history of Echocardiogram examinations, most                 recent 08/10/2017. CHF, CAD, Prior CABG, Stroke,                 Arrythmias:Atrial Fibrillation; Risk Factors:Hypertension,                 Diabetes and Dyslipidemia.  Sonographer:    Wenda Low Referring Phys: [id number] [place]  1. Left ventricular ejection fraction, by estimation, is 60 to 65%. The left ventricle has normal function. The left ventricle has no regional wall motion abnormalities. There is mild left ventricular hypertrophy. Left ventricular diastolic parameters are consistent with Grade I diastolic dysfunction (impaired relaxation).  2. Right ventricular systolic function is normal. The right ventricular size is normal. There is normal pulmonary artery systolic pressure.  3. Left atrial size was moderately dilated.  4. The mitral valve is normal in structure. No evidence of mitral valve regurgitation. No evidence of mitral stenosis.  5. The  aortic valve is normal in structure. Aortic valve regurgitation is  trivial. Mild to moderate aortic valve sclerosis/calcification is present, without any evidence of aortic stenosis.  6. The inferior vena cava is normal in size with greater than 50% respiratory variability, suggesting right atrial pressure of 3 mmHg. Conclusion(s)/Recommendation(s): No intracardiac source of embolism detected on this transthoracic study. A transesophageal echocardiogram is recommended to exclude cardiac source of embolism if clinically indicated. FINDINGS  Left Ventricle: Left ventricular ejection fraction, by estimation, is 60 to 65%. The left ventricle has normal function. The left ventricle has no regional wall motion abnormalities. The left ventricular internal cavity size was normal in size. There is  mild left ventricular hypertrophy. Left ventricular diastolic parameters are consistent with Grade I diastolic dysfunction (impaired relaxation). Right Ventricle: The right ventricular size is normal. No increase in right ventricular wall thickness. Right ventricular systolic function is normal. There is normal pulmonary artery systolic pressure. The tricuspid regurgitant velocity is 2.55 m/s, and  with an assumed right atrial pressure of 3 mmHg, the estimated right ventricular systolic pressure is 78.5 mmHg. Left Atrium: Left atrial size was moderately dilated. Right Atrium: Right atrial size was normal in size. Pericardium: There is no evidence of pericardial effusion. Mitral Valve: The mitral valve is normal in structure. No evidence of mitral valve regurgitation. No evidence of mitral valve stenosis. Tricuspid Valve: The tricuspid valve is normal in structure. Tricuspid valve regurgitation is not demonstrated. No evidence of tricuspid stenosis. Aortic Valve: The aortic valve is normal in structure. Aortic valve regurgitation is trivial. Mild to moderate aortic valve sclerosis/calcification is present, without any evidence of aortic stenosis. Aortic valve mean gradient measures 6.0 mmHg.  Aortic valve peak gradient measures 11.4 mmHg. Aortic valve area, by VTI measures 1.58 cm. Pulmonic Valve: The pulmonic valve was normal in structure. Pulmonic valve regurgitation is not visualized. No evidence of pulmonic stenosis. Aorta: The aortic root is normal in size and structure. Venous: The inferior vena cava is normal in size with greater than 50% respiratory variability, suggesting right atrial pressure of 3 mmHg. IAS/Shunts: No atrial level shunt detected by color flow Doppler.  LEFT VENTRICLE PLAX 2D LVIDd:         3.80 cm     Diastology LVIDs:         2.60 cm     LV e' medial:    6.20 cm/s LV PW:         1.20 cm     LV E/e' medial:  15.8 LV IVS:        1.10 cm     LV e' lateral:   8.05 cm/s LVOT diam:     2.00 cm     LV E/e' lateral: 12.2 LV SV:         62 LV SV Index:   35 LVOT Area:     3.14 cm  LV Volumes (MOD) LV vol d, MOD A2C: 64.0 ml LV vol d, MOD A4C: 77.4 ml LV vol s, MOD A2C: 25.8 ml LV vol s, MOD A4C: 30.0 ml LV SV MOD A2C:     38.2 ml LV SV MOD A4C:     77.4 ml LV SV MOD BP:      44.4 ml RIGHT VENTRICLE RV Basal diam:  3.15 cm RV Mid diam:    3.80 cm RV S prime:     8.39 cm/s TAPSE (M-mode): 1.7 cm LEFT ATRIUM  Index       RIGHT ATRIUM           Index LA diam:        4.90 cm 2.75 cm/m  RA Area:     11.90 cm LA Vol (A2C):   56.0 ml 31.43 ml/m RA Volume:   21.40 ml  12.01 ml/m LA Vol (A4C):   57.5 ml 32.27 ml/m LA Biplane Vol: 58.6 ml 32.89 ml/m  AORTIC VALVE AV Area (Vmax):    1.59 cm AV Area (Vmean):   1.56 cm AV Area (VTI):     1.58 cm AV Vmax:           169.00 cm/s AV Vmean:          115.000 cm/s AV VTI:            0.392 m AV Peak Grad:      11.4 mmHg AV Mean Grad:      6.0 mmHg LVOT Vmax:         85.50 cm/s LVOT Vmean:        57.100 cm/s LVOT VTI:          0.197 m LVOT/AV VTI ratio: 0.50 MITRAL VALVE                TRICUSPID VALVE MV Area (PHT): 3.37 cm     TR Peak grad:   26.0 mmHg MV Decel Time: 225 msec     TR Vmax:        255.00 cm/s MV E velocity: 98.10 cm/s  MV A velocity: 118.00 cm/s  SHUNTS MV E/A ratio:  0.83         Systemic VTI:  0.20 m                             Systemic Diam: 2.00 cm Donato Schultz MD Electronically signed by Donato Schultz MD Signature Date/Time: 11/14/2020/3:42:26 PM    Final    CT HEAD CODE STROKE WO CONTRAST  Result Date: 11/13/2020 CLINICAL DATA:  Code stroke.  Acute neurologic deficit EXAM: CT HEAD WITHOUT CONTRAST TECHNIQUE: Contiguous axial images were obtained from the base of the skull through the vertex without intravenous contrast. COMPARISON:  None. FINDINGS: Brain: There is no mass, hemorrhage or extra-axial collection. There is generalized atrophy without lobar predilection. There is hypoattenuation of the periventricular white matter, most commonly indicating chronic ischemic microangiopathy. Vascular: Atherosclerotic calcification of the vertebral and internal carotid arteries at the skull base. No abnormal hyperdensity of the major intracranial arteries or dural venous sinuses. Skull: The visualized skull base, calvarium and extracranial soft tissues are normal. Sinuses/Orbits: No fluid levels or advanced mucosal thickening of the visualized paranasal sinuses. No mastoid or middle ear effusion. The orbits are normal. ASPECTS [facility] Stroke Program Early CT Score) - Ganglionic level infarction (caudate, lentiform nuclei, internal capsule, insula, M1-M3 cortex): 7 - Supraganglionic infarction (M4-M6 cortex): 3 Total score (0-10 with 10 being normal): 10 IMPRESSION: 1. No acute intracranial abnormality. 2. ASPECTS is 10. 3. Chronic ischemic microangiopathy and generalized atrophy. These results were communicated to Dr. Erick Blinks at 7:42 pm on 11/13/2020 by text page via the [facility] messaging system. Electronically Signed   By: Deatra Robinson M.D.   On: 11/13/2020 19:42   VAS [place] LOWER EXTREMITY VENOUS (DVT)  Result Date: 11/14/2020  Lower Venous DVT Study Patient Name:  Fred [name]  Date of Exam:   11/14/2020 Medical Rec #:  [id number]         Accession #:    [id number] Date of Birth: 05-08-1935         Patient Gender: M Patient Age:   77 years Exam Location:  [facility] Procedure:      VAS [place] LOWER EXTREMITY VENOUS (DVT) Referring Phys: Roxanne Mins PATEL --------------------------------------------------------------------------------  Indications: Edema.  Limitations: Poor ultrasound/tissue interface, body habitus and restricted mobility. Comparison Study: No prior study Performing Technologist: Maudry Mayhew MHA, RDMS, RVT, RDCS  Examination Guidelines: A complete evaluation includes B-mode imaging, spectral Doppler, color Doppler, and power Doppler as needed of all accessible portions of each vessel. Bilateral testing is considered an integral part of a complete examination. Limited examinations for reoccurring indications may be performed as noted. The reflux portion of the exam is performed with the patient in reverse Trendelenburg.  +-----+---------------+---------+-----------+----------+--------------+ RIGHTCompressibilityPhasicitySpontaneityPropertiesThrombus Aging +-----+---------------+---------+-----------+----------+--------------+ CFV  Full           Yes      Yes                                 +-----+---------------+---------+-----------+----------+--------------+   +---------+---------------+---------+-----------+----------+--------------+ LEFT     CompressibilityPhasicitySpontaneityPropertiesThrombus Aging +---------+---------------+---------+-----------+----------+--------------+ CFV      Full           Yes      Yes                                 +---------+---------------+---------+-----------+----------+--------------+ SFJ      Full                                                        +---------+---------------+---------+-----------+----------+--------------+ FV Prox  Full                                                         +---------+---------------+---------+-----------+----------+--------------+ FV Mid   Full                                                        +---------+---------------+---------+-----------+----------+--------------+ FV DistalFull                                                        +---------+---------------+---------+-----------+----------+--------------+ PFV      Full                                                        +---------+---------------+---------+-----------+----------+--------------+ POP      Full           Yes  Yes                                 +---------+---------------+---------+-----------+----------+--------------+ PTV      Full                                                        +---------+---------------+---------+-----------+----------+--------------+ PERO     Full                                                        +---------+---------------+---------+-----------+----------+--------------+    Summary: RIGHT: - No evidence of common femoral vein obstruction.  LEFT: - There is no evidence of deep vein thrombosis in the lower extremity.  - No cystic structure found in the popliteal fossa.  *See table(s) above for measurements and observations. Electronically signed by Deitra Mayo MD on 11/14/2020 at 12:00:25 PM.    Final      Subjective: Patient seen examined at bedside, resting comfortably.  No complaints this morning.  Eating breakfast.  Discharging to rehab today.  No other questions or concerns at this time.  Denies headache, no fever/chills/night sweats, no nausea/vomiting/diarrhea, no chest pain, no palpitations, no shortness of breath, no abdominal pain, no weakness, no fatigue, no paresthesias.  No acute events overnight per nursing staff.  Discharge Exam: Vitals:   12/01/20 0753 12/01/20 1021  BP:  128/66  Pulse:  76  Resp:    Temp:    SpO2: 93%    Vitals:   11/30/20 2100 12/01/20 0504 12/01/20 0753  12/01/20 1021  BP: (!) 134/58 116/62  128/66  Pulse: 91 73  76  Resp: 19 16    Temp: (!) 97.4 F (36.3 C) 98.5 F (36.9 C)    TempSrc: Axillary Axillary    SpO2: 96% 93% 93%   Weight:      Height:        General: Pt is alert, awake, not in acute distress, elderly in appearance Cardiovascular: RRR, S1/S2 +, no rubs, no gallops Respiratory: CTA bilaterally, no wheezing, no rhonchi, on room air Abdominal: Soft, NT, ND, bowel sounds + Extremities: no edema, no cyanosis    The results of significant diagnostics from this hospitalization (including imaging, microbiology, ancillary and laboratory) are listed below for reference.     Microbiology: Recent Results (from the past 240 hour(s))  Culture, blood (routine x 2)     Status: None   Collection Time: 11/22/20  4:45 PM   Specimen: BLOOD RIGHT HAND  Result Value Ref Range Status   Specimen Description BLOOD RIGHT HAND  Final   Special Requests   Final    BOTTLES DRAWN AEROBIC ONLY Blood Culture results may not be optimal due to an inadequate volume of blood received in culture bottles   Culture   Final    NO GROWTH 5 DAYS Performed at [facility], [place] [address], [place], [place] [address]    Report Status 11/27/2020 FINAL  Final  Culture, blood (routine x 2)     Status: None   Collection Time: 11/22/20  5:11 PM   Specimen: BLOOD  RIGHT ARM  Result Value Ref Range Status   Specimen Description BLOOD RIGHT ARM  Final   Special Requests   Final    BOTTLES DRAWN AEROBIC AND ANAEROBIC Blood Culture results may not be optimal due to an inadequate volume of blood received in culture bottles   Culture   Final    NO GROWTH 5 DAYS Performed at [facility], [place] [address] [place], [place] [address]    Report Status 11/27/2020 FINAL  Final  Urine Culture     Status: Abnormal   Collection Time: 11/23/20  4:28 PM   Specimen: Urine, Clean Catch  Result Value Ref Range Status   Specimen Description URINE, CLEAN CATCH   Final   Special Requests NONE  Final   Culture (A)  Final    <10,000 COLONIES/mL INSIGNIFICANT GROWTH Performed at [facility], [place] [address] [place], [place] [address]    Report Status 11/25/2020 FINAL  Final     Labs: BNP (last 3 results) No results for input(s): BNP in the last 8760 hours. Basic Metabolic Panel: Recent Labs  Lab 11/25/20 0148 11/27/20 0152 11/28/20 0143 11/29/20 0144 11/30/20 0413  NA 138 141 140 142 141  K 3.9 3.6 3.7 3.6 3.5  CL 109 106 106 109 104  CO2 $Re'24 25 25 26 25  'vgk$ GLUCOSE 127* 80 130* 178* 195*  BUN 27* $Remov'14 13 14 11  'bUbiAW$ CREATININE 0.97 0.71 0.90 0.99 0.91  CALCIUM 8.0* 8.6* 8.4* 8.4* 8.3*  MG 2.0 1.6* 1.7  --   --   PHOS 2.3* 2.3* 2.5  --   --    Liver Function Tests: Recent Labs  Lab 11/25/20 0148  AST 25  ALT 24  ALKPHOS 39  BILITOT 0.6  PROT 5.3*  ALBUMIN 2.2*   No results for input(s): LIPASE, AMYLASE in the last 168 hours. No results for input(s): AMMONIA in the last 168 hours. CBC: Recent Labs  Lab 11/25/20 0148 11/27/20 0152  WBC 6.1 7.2  NEUTROABS 2.6  --   HGB 11.5* 12.3*  HCT 34.5* 36.6*  MCV 96.4 96.1  PLT 228 271   Cardiac Enzymes: No results for input(s): CKTOTAL, CKMB, CKMBINDEX, TROPONINI in the last 168 hours. BNP: Invalid input(s): POCBNP CBG: Recent Labs  Lab 11/30/20 0801 11/30/20 1123 11/30/20 1656 11/30/20 2055 12/01/20 0725  GLUCAP 149* 158* 208* 184* 71   D-Dimer No results for input(s): DDIMER in the last 72 hours. Hgb A1c No results for input(s): HGBA1C in the last 72 hours. Lipid Profile No results for input(s): CHOL, HDL, LDLCALC, TRIG, CHOLHDL, LDLDIRECT in the last 72 hours. Thyroid function studies No results for input(s): TSH, T4TOTAL, T3FREE, THYROIDAB in the last 72 hours.  Invalid input(s): FREET3 Anemia work up No results for input(s): VITAMINB12, FOLATE, FERRITIN, TIBC, IRON, RETICCTPCT in the last 72 hours. Urinalysis    Component Value Date/Time   COLORURINE  YELLOW 11/23/2020 1628   APPEARANCEUR CLEAR 11/23/2020 1628   LABSPEC 1.018 11/23/2020 1628   PHURINE 5.0 11/23/2020 1628   GLUCOSEU NEGATIVE 11/23/2020 1628   GLUCOSEU NEGATIVE 05/16/2013 1009   HGBUR SMALL (A) 11/23/2020 1628   BILIRUBINUR NEGATIVE 11/23/2020 1628   BILIRUBINUR Negative 04/18/2018 1020   KETONESUR NEGATIVE 11/23/2020 1628   PROTEINUR NEGATIVE 11/23/2020 1628   UROBILINOGEN 1.0 04/18/2018 1020   UROBILINOGEN 0.2 05/16/2013 1009   NITRITE NEGATIVE 11/23/2020 1628   LEUKOCYTESUR NEGATIVE 11/23/2020 1628   Sepsis Labs Invalid input(s): PROCALCITONIN,  WBC,  LACTICIDVEN  Microbiology Recent Results (from the past 240 hour(s))  Culture, blood (routine x 2)     Status: None   Collection Time: 11/22/20  4:45 PM   Specimen: BLOOD RIGHT HAND  Result Value Ref Range Status   Specimen Description BLOOD RIGHT HAND  Final   Special Requests   Final    BOTTLES DRAWN AEROBIC ONLY Blood Culture results may not be optimal due to an inadequate volume of blood received in culture bottles   Culture   Final    NO GROWTH 5 DAYS Performed at [facility], [place] [address], [place], [place] [address]    Report Status 11/27/2020 FINAL  Final  Culture, blood (routine x 2)     Status: None   Collection Time: 11/22/20  5:11 PM   Specimen: BLOOD RIGHT ARM  Result Value Ref Range Status   Specimen Description BLOOD RIGHT ARM  Final   Special Requests   Final    BOTTLES DRAWN AEROBIC AND ANAEROBIC Blood Culture results may not be optimal due to an inadequate volume of blood received in culture bottles   Culture   Final    NO GROWTH 5 DAYS Performed at [facility], [place] [address], [place], [place] [address]    Report Status 11/27/2020 FINAL  Final  Urine Culture     Status: Abnormal   Collection Time: 11/23/20  4:28 PM   Specimen: Urine, Clean Catch  Result Value Ref Range Status   Specimen Description URINE, CLEAN CATCH  Final   Special Requests NONE  Final   Culture  (A)  Final    <10,000 COLONIES/mL INSIGNIFICANT GROWTH Performed at [facility], [place] [address], [place], [place] [address]    Report Status 11/25/2020 FINAL  Final     Time coordinating discharge: Over 30 minutes  SIGNED:   Donnamarie Poag [place] DO  [facility] 12/01/2020, 10:50 AM

## 2020-12-02 ENCOUNTER — Telehealth: Payer: Self-pay | Admitting: General Practice

## 2020-12-02 NOTE — Telephone Encounter (Signed)
Transition Care Management Follow-up Telephone Call Date of discharge and from where: 12/01/20 from [facility] How have you been since you were released from the hospital? [facility] rehab and will eventually transfer to long-term care. The wife stated that he is in rehab and the doctor at the rehab will do the one week follow up. The wife will call back to schedule. Any questions or concerns? No  Items Reviewed: Did the pt receive and understand the discharge instructions provided? Yes  Medications obtained and verified? No  Other? No  Any new allergies since your discharge? No  Dietary orders reviewed? Yes Do you have support at home? Yes   Home Care and Equipment/Supplies: Were home health services ordered? no  Functional Questionnaire: (I = Independent and D = Dependent) ADLs: D  Bathing/Dressing- D  Meal Prep- D  Eating- D  Maintaining continence- D  Transferring/Ambulation- D  Managing Meds- D  Follow up appointments reviewed:  PCP Hospital f/u appt confirmed? No  The wife stated that he is in rehab and the doctor at the rehab will do the one week follow up. The wife will call back to schedule. [facility] f/u appt confirmed? No  Patient is in SNF at rehab. Are transportation arrangements needed? No  If their condition worsens, is the pt aware to call PCP or go to the Emergency Dept.? Yes Was the patient provided with contact information for the PCP's office or ED? Yes Was to pt encouraged to call back with questions or concerns? Yes

## 2020-12-14 ENCOUNTER — Inpatient Hospital Stay: Payer: Medicare Other | Admitting: Family Medicine

## 2021-01-04 NOTE — Progress Notes (Deleted)
[facility] [place]  ASSESSMENT / PLAN: Fred [name] is a 85 y.o. male with asymptomatic left 42 - 79 % carotid artery stenosis. Stenosis has not yet reached the threshold of 80% to consider asymptomatic intervention.  The patient should continue best medical therapy for carotid artery stenosis including: Complete cessation from all tobacco products. Blood glucose control with goal A1c < 7%. Blood pressure control with goal blood pressure < 140/90 mmHg. Lipid reduction therapy with goal LDL-C <100 mg/dL (<70 if symptomatic from carotid artery stenosis).  Aspirin $Remove'81mg'ebvhooP$  PO QD.  Atorvastatin 40-$RemoveBeforeDE'80mg'sRKIhjgWFzMFisK$  PO QD (or other "high intensity" statin therapy).  Follow up in 2 years with [facility] PA repeat carotid duplex   CHIEF COMPLAINT: ***  HISTORY OF PRESENT ILLNESS: Fred [name] is a 85 y.o. male ***  VASCULAR SURGICAL HISTORY: ***  VASCULAR RISK FACTORS: {FINDINGS; POSITIVE NEGATIVE:469 179 7465} history of stroke / transient ischemic attack. {FINDINGS; POSITIVE NEGATIVE:469 179 7465} history of coronary artery disease. *** history of PCI. *** history of CABG.  {FINDINGS; POSITIVE NEGATIVE:469 179 7465} history of diabetes mellitus. Last A1c ***. {FINDINGS; POSITIVE NEGATIVE:469 179 7465} history of smoking. *** actively smoking. {FINDINGS; POSITIVE NEGATIVE:469 179 7465} history of hypertension. *** drug regimen with *** control. {FINDINGS; POSITIVE NEGATIVE:469 179 7465} history of chronic kidney disease.  Last GFR ***. CKD {stage:30421363}. {FINDINGS; POSITIVE NEGATIVE:469 179 7465} history of chronic obstructive pulmonary disease, treated with ***.  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 [name]   CORONARY ARTERY BYPASS GRAFT  02/13/2004   4 vesel   removal of sebaceous cyst from neck  08/2007   Dr. 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 Mostly coffee. He walks 15 minutes 3 days per week. Quit smoking in 1978.      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    Social Determinants of Health   Financial Resource Strain: Not on file  Food Insecurity: Not on file  Transportation Needs: Not on file  Physical Activity: Not on file  Stress:  Not on file  Social Connections: Not on file  Intimate Partner Violence: Not on file    Allergies  Allergen Reactions   Penicillins Anaphylaxis   Lisinopril Cough    Current Outpatient Medications  Medication Sig Dispense Refill   acetaminophen (TYLENOL) 325 MG tablet Take 2 tablets (650 mg total) by mouth every 4 (four) hours as needed for mild pain (or temp > 37.5 C (99.5 F)).     allopurinol (ZYLOPRIM) 300 MG tablet Take 1 tablet (300 mg total) by mouth daily. 90 tablet 1   ASPIRIN EC 81 MG EC tablet Take 81 mg by mouth daily.     atorvastatin (LIPITOR) 80 MG tablet Take 1 tablet (80 mg total) by mouth daily. 90 tablet 1   baclofen (LIORESAL) 10 MG tablet Take 10 mg by mouth 2 (two) times daily.     blood glucose meter kit and supplies Check blood sugar 3 times daily. 1 each 0   Cholecalciferol (VITAMIN D) 50 MCG (2000 UT) tablet Take 2,000 Units by mouth daily.     clopidogrel (PLAVIX) 75 MG tablet Take 1 tablet (75 mg total) by mouth daily.     Coenzyme Q10 (COQ10) 100 MG CAPS Take 300 mg by mouth daily.     divalproex (DEPAKOTE ER) 500 MG 24 hr tablet TAKE 1 TABLET(500 MG) BY MOUTH DAILY (Patient taking differently: Take 500 mg by mouth at bedtime.) 90 tablet 2   doxepin (SINEQUAN) 10 MG capsule Take 1 capsule (10 mg total) by mouth at bedtime.     escitalopram (LEXAPRO) 10 MG tablet Take 10 mg by mouth daily.     ezetimibe (ZETIA) 10 MG tablet Take 1 tablet (10 mg total) by mouth daily. 90 tablet 1   insulin glargine, 1 Unit Dial, (TOUJEO SOLOSTAR) 300 UNIT/ML Solostar Pen Inject 22 Units into the skin at bedtime. 18 mL 3   JARDIANCE 10 MG TABS tablet TAKE 1 TABLET(10 MG) BY MOUTH DAILY BEFORE BREAKFAST (Patient taking differently: Take 25 mg by mouth daily.) 90 tablet 1   Lancets (ONETOUCH DELICA PLUS [id number]) MISC      linagliptin (TRADJENTA) 5 MG TABS tablet Take 1 tablet (5 mg total) by mouth daily. 30 tablet 0   losartan (COZAAR) 25 MG tablet Take 1 tablet (25 mg total) by  mouth daily. 30 tablet 0   Magnesium Oxide 420 MG TABS Take 420 mg by mouth daily.     metoprolol succinate (TOPROL-XL) 25 MG 24 hr tablet Take 0.5 tablets (12.5 mg total) by mouth daily.     niacin (NIASPAN) 500 MG CR tablet TAKE 1 TABLET(500 MG) BY MOUTH AT BEDTIME (Patient taking differently: Take 500 mg by mouth at bedtime.) 90 tablet 3   NOVOFINE PEN NEEDLE 32G X 6 MM MISC INJECT INSULIN DAILY 100 each prn   ONE TOUCH ULTRA TEST test strip      senna-docusate (SENOKOT-S) 8.6-50 MG tablet Take 1 tablet by  mouth 2 (two) times daily. (Patient taking differently: Take 1 tablet by mouth as needed for mild constipation.)     tamsulosin (FLOMAX) 0.4 MG CAPS capsule Take 1 capsule (0.4 mg total) by mouth daily after supper. 30 capsule    No current facility-administered medications for this visit.    REVIEW OF SYSTEMS:  $RemoveB'[X]'xFmDUWKx$  denotes positive finding, $RemoveBeforeDEI'[ ]'TpYHCkkadyjrLFGJ$  denotes negative finding Cardiac  Comments:  Chest pain or chest pressure: ***   Shortness of breath upon exertion:    Short of breath when lying flat:    Irregular heart rhythm:        Vascular    Pain in calf, thigh, or hip brought on by ambulation:    Pain in feet at night that wakes you up from your sleep:     Blood clot in your veins:    Leg swelling:         Pulmonary    Oxygen at home:    Productive cough:     Wheezing:         Neurologic    Sudden weakness in arms or legs:     Sudden numbness in arms or legs:     Sudden onset of difficulty speaking or slurred speech:    Temporary loss of vision in one eye:     Problems with dizziness:         Gastrointestinal    Blood in stool:     Vomited blood:         Genitourinary    Burning when urinating:     Blood in urine:        Psychiatric    Major depression:         Hematologic    Bleeding problems:    Problems with blood clotting too easily:        Skin    Rashes or ulcers:        Constitutional    Fever or chills:      PHYSICAL EXAM There were no vitals  filed for this visit.  Constitutional: *** appearing. *** distress. Appears *** nourished.  Neurologic: CN ***. *** focal findings. *** sensory loss. Psychiatric: *** Mood and affect symmetric and appropriate. Eyes: *** No icterus. No conjunctival pallor. Ears, nose, throat: *** mucous membranes moist. Midline trachea.  Cardiac: *** rate and rhythm.  Respiratory: *** unlabored. Abdominal: *** soft, non-tender, non-distended.  Peripheral vascular: *** Extremity: *** edema. *** cyanosis. *** pallor.  Skin: *** gangrene. *** ulceration.  Lymphatic: *** Stemmer's sign. *** palpable lymphadenopathy.  PERTINENT LABORATORY AND RADIOLOGIC DATA  Most recent CBC CBC Latest Ref Rng & Units 11/27/2020 11/25/2020 11/24/2020  WBC 4.0 - 10.5 K/uL 7.2 6.1 8.1  Hemoglobin 13.0 - 17.0 g/dL 12.3(L) 11.5(L) 12.2(L)  Hematocrit 39.0 - 52.0 % 36.6(L) 34.5(L) 35.6(L)  Platelets 150 - 400 K/uL 271 228 210     Most recent CMP CMP Latest Ref Rng & Units 11/30/2020 11/29/2020 11/28/2020  Glucose 70 - 99 mg/dL 195(H) 178(H) 130(H)  BUN 8 - 23 mg/dL $Remove'11 14 13  'lCbDZWk$ Creatinine 0.61 - 1.24 mg/dL 0.91 0.99 0.90  Sodium 135 - 145 mmol/L 141 142 140  Potassium 3.5 - 5.1 mmol/L 3.5 3.6 3.7  Chloride 98 - 111 mmol/L 104 109 106  CO2 22 - 32 mmol/L $RemoveB'25 26 25  'wLatkIhN$ Calcium 8.9 - 10.3 mg/dL 8.3(L) 8.4(L) 8.4(L)  Total Protein 6.5 - 8.1 g/dL - - -  Total Bilirubin 0.3 - 1.2 mg/dL - - -  Alkaline Phos 38 - 126 U/L - - -  AST 15 - 41 U/L - - -  ALT 0 - 44 U/L - - -    Renal function CrCl cannot be calculated (Patient's most recent lab result is older than the maximum 21 days allowed.).  Hgb A1c MFr Bld (%)  Date Value  11/14/2020 7.8 (H)    LDL Cholesterol  Date Value Ref Range Status  11/14/2020 52 0 - 99 mg/dL Final    Comment:           Total Cholesterol/HDL:CHD Risk Coronary Heart Disease Risk Table                     Men   Women  1/2 Average Risk   3.4   3.3  Average Risk       5.0   4.4  2 X Average Risk    9.6   7.1  3 X Average Risk  23.4   11.0        Use the calculated Patient Ratio above and the CHD Risk Table to determine the patient's CHD Risk.        ATP III CLASSIFICATION (LDL):  <100     mg/dL   Optimal  100-129  mg/dL   Near or Above                    Optimal  130-159  mg/dL   Borderline  160-189  mg/dL   High  >190     mg/dL   Very High Performed at [facility] [address] [place], [place] [address]     CLINICAL DATA:  Stroke workup   EXAM: CT ANGIOGRAPHY NECK   TECHNIQUE: Multidetector CT imaging of the neck was performed using the standard protocol during bolus administration of intravenous contrast. Multiplanar CT image reconstructions and MIPs were obtained to evaluate the vascular anatomy. Carotid stenosis measurements (when applicable) are obtained utilizing NASCET criteria, using the distal internal carotid diameter as the denominator.   CONTRAST:  73mL OMNIPAQUE IOHEXOL 350 MG/ML SOLN   COMPARISON:  Brain MRI and MRA from yesterday and today respectively   FINDINGS: Aortic arch: Atheromatous plaque.  Prior CABG.   Right carotid system: Diffuse atherosclerosis that diffuse intermittent atheromatous plaque of the common carotid and proximal ICA. No flow limiting stenosis of 50% or greater. Moderate plaque irregularity without dissection or clear ulceration.   Left carotid system: Atheromatous plaque diffusely involving the common carotid which is small compared to the right. Extensive plaque at the bifurcation and proximal ICA with proximal ICA stenosis measuring 60%. No dissection. ICA tortuosity with looping below the skull base.   Vertebral arteries: No proximal subclavian stenosis of hemodynamic significance. Calcified plaque at both vertebral origins with 60% stenosis on both sides as measured on coronal reformats. Negative for dissection or beading.   Skeleton: Cervical spine degeneration with reversed lordosis and C4-5, C5-6  and mild anterolisthesis.   Other neck: No acute finding   Upper chest: Airway thickening   IMPRESSION: 1. Cervical carotid atherosclerosis with 60% narrowing at the left ICA origin. 2. 60% bilateral vertebral origin stenosis.  MR Brain 11/14/20 1. Two punctate foci of acute ischemia within the subcortical right frontal lobe, 1 of which is along the right precentral gyrus. 2. No hemorrhage or mass effect. 3. Advanced white matter disease most consistent with chronic small vessel ischemia.  Yevonne Aline. Stanford Breed, MD [facility]  [place] Phone Number: [phone] 01/04/2021 3:01 PM  Total time spent on preparing this encounter including chart review, data review, collecting history, examining the patient, coordinating care for this {tnhtimebilling:26202}  Portions of this report may have been transcribed using voice recognition software.  Every effort has been made to ensure accuracy; however, inadvertent computerized transcription errors may still be present.

## 2021-01-05 ENCOUNTER — Encounter: Payer: Medicare Other | Admitting: Vascular Surgery

## 2021-01-06 ENCOUNTER — Ambulatory Visit: Payer: Medicare Other | Admitting: Neurology

## 2021-01-21 ENCOUNTER — Ambulatory Visit: Payer: Medicare Other | Admitting: Neurology

## 2021-03-10 ENCOUNTER — Encounter: Payer: Self-pay | Admitting: *Deleted

## 2021-03-18 ENCOUNTER — Emergency Department (HOSPITAL_COMMUNITY)
Admission: EM | Admit: 2021-03-18 | Discharge: 2021-03-18 | Discharge status: Against Medical Advice | Payer: Medicare Other | Attending: Emergency Medicine | Admitting: Emergency Medicine

## 2021-03-18 ENCOUNTER — Emergency Department (HOSPITAL_COMMUNITY): Payer: Medicare Other

## 2021-03-18 DIAGNOSIS — R531 Weakness: Secondary | ICD-10-CM | POA: Insufficient documentation

## 2021-03-18 DIAGNOSIS — Z5321 Procedure and treatment not carried out due to patient leaving prior to being seen by health care provider: Secondary | ICD-10-CM | POA: Diagnosis not present

## 2021-03-18 LAB — CBG MONITORING, ED: Glucose-Capillary: 111 mg/dL — ABNORMAL HIGH (ref 70–99)

## 2021-03-18 NOTE — ED Notes (Signed)
Pt stated they are going to the doctor in the am

## 2021-03-18 NOTE — ED Triage Notes (Signed)
Pt here via [facility] from home, called for stroke, hx stroke w/ L side deficits, no acute unilateral weakness. Per family pt has been having R side preference, and increased generalized weakness for 4 days. Pt denies any pain or complaints. Aox3, per family that is baseline. 18g RAC.

## 2021-03-18 NOTE — ED Provider Triage Note (Signed)
Emergency Medicine Provider Triage Evaluation Note  Fred [name] , a 86 y.o. male  was evaluated in triage.  Pt complains of weakness.  Patient was sent by home health nurse with family members with concern for weakness and leaning to the left.  The family member at bedside states that she is unsure why he got sent here.  She states that he has had multiple previous strokes with residual left-sided deficits and consistently will be leaning to the left.  This is not new for him.  Patient has no complaints at this time.  Review of Systems  Positive: See above Negative:   Physical Exam  BP (!) 112/53 (BP Location: Right Arm)    Pulse (!) 59    Temp 98.1 F (36.7 C) (Oral)    Resp 16    SpO2 99%  Gen:   Awake, no distress, alert and oriented x3 Resp:  Normal effort  MSK:    Other:  Patient with decreased shoulder shrug on the left.  He has very limited movement of the left arm.  Unable to move the left leg.  All of this is chronic.  Sensation intact.  No other cranial nerve deficits.  Strength 5/5 in right upper and right lower extremity.  Medical Decision Making  Medically screening exam initiated at 4:02 PM.  Appropriate orders placed.  SEQUOIA MINCEY was informed that the remainder of the evaluation will be completed by another provider, this initial triage assessment does not replace that evaluation, and the importance of remaining in the ED until their evaluation is complete.     Mickie Hillier, PA-C 03/18/21 1605

## 2021-03-19 ENCOUNTER — Telehealth: Payer: Self-pay | Admitting: General Practice

## 2021-03-19 NOTE — Telephone Encounter (Signed)
Transition Care Management Unsuccessful Follow-up Telephone Call  Date of discharge and from where:  03/18/21 from [facility]  Attempts:  1st Attempt  Reason for unsuccessful TCM follow-up call:  Left voice message

## 2021-03-22 NOTE — Telephone Encounter (Signed)
Transition Care Management Unsuccessful Follow-up Telephone Call  Date of discharge and from where:  03/18/21 from [facility]  Attempts:  2nd Attempt  Reason for unsuccessful TCM follow-up call:  Left voice message

## 2021-03-24 NOTE — Telephone Encounter (Signed)
Transition Care Management Unsuccessful Follow-up Telephone Call  Date of discharge and from where:  03/18/21 from [facility]  Attempts:  3rd Attempt  Reason for unsuccessful TCM follow-up call:  Left voice message

## 2021-05-27 ENCOUNTER — Inpatient Hospital Stay (HOSPITAL_COMMUNITY)
Admission: EM | Admit: 2021-05-27 | Discharge: 2021-06-10 | DRG: 884 | Disposition: A | Discharge status: Skilled Nursing Facility | Payer: Medicare Other | Attending: Family Medicine | Admitting: Family Medicine

## 2021-05-27 ENCOUNTER — Encounter (HOSPITAL_COMMUNITY): Payer: Self-pay | Admitting: Emergency Medicine

## 2021-05-27 ENCOUNTER — Other Ambulatory Visit: Payer: Self-pay

## 2021-05-27 ENCOUNTER — Emergency Department (HOSPITAL_COMMUNITY): Payer: Medicare Other

## 2021-05-27 DIAGNOSIS — R778 Other specified abnormalities of plasma proteins: Secondary | ICD-10-CM

## 2021-05-27 DIAGNOSIS — Z66 Do not resuscitate: Secondary | ICD-10-CM

## 2021-05-27 DIAGNOSIS — E876 Hypokalemia: Secondary | ICD-10-CM | POA: Diagnosis present

## 2021-05-27 DIAGNOSIS — I5032 Chronic diastolic (congestive) heart failure: Secondary | ICD-10-CM | POA: Diagnosis not present

## 2021-05-27 DIAGNOSIS — Z7902 Long term (current) use of antithrombotics/antiplatelets: Secondary | ICD-10-CM

## 2021-05-27 DIAGNOSIS — Z880 Allergy status to penicillin: Secondary | ICD-10-CM

## 2021-05-27 DIAGNOSIS — M1990 Unspecified osteoarthritis, unspecified site: Secondary | ICD-10-CM | POA: Diagnosis present

## 2021-05-27 DIAGNOSIS — K5790 Diverticulosis of intestine, part unspecified, without perforation or abscess without bleeding: Secondary | ICD-10-CM | POA: Diagnosis present

## 2021-05-27 DIAGNOSIS — Z7984 Long term (current) use of oral hypoglycemic drugs: Secondary | ICD-10-CM

## 2021-05-27 DIAGNOSIS — Z888 Allergy status to other drugs, medicaments and biological substances status: Secondary | ICD-10-CM

## 2021-05-27 DIAGNOSIS — Z794 Long term (current) use of insulin: Secondary | ICD-10-CM

## 2021-05-27 DIAGNOSIS — N179 Acute kidney failure, unspecified: Secondary | ICD-10-CM

## 2021-05-27 DIAGNOSIS — Z87892 Personal history of anaphylaxis: Secondary | ICD-10-CM

## 2021-05-27 DIAGNOSIS — I248 Other forms of acute ischemic heart disease: Secondary | ICD-10-CM | POA: Diagnosis present

## 2021-05-27 DIAGNOSIS — Z79899 Other long term (current) drug therapy: Secondary | ICD-10-CM

## 2021-05-27 DIAGNOSIS — Z951 Presence of aortocoronary bypass graft: Secondary | ICD-10-CM

## 2021-05-27 DIAGNOSIS — R54 Age-related physical debility: Principal | ICD-10-CM | POA: Diagnosis present

## 2021-05-27 DIAGNOSIS — E118 Type 2 diabetes mellitus with unspecified complications: Secondary | ICD-10-CM

## 2021-05-27 DIAGNOSIS — I69354 Hemiplegia and hemiparesis following cerebral infarction affecting left non-dominant side: Secondary | ICD-10-CM

## 2021-05-27 DIAGNOSIS — Z87891 Personal history of nicotine dependence: Secondary | ICD-10-CM

## 2021-05-27 DIAGNOSIS — R7989 Other specified abnormal findings of blood chemistry: Secondary | ICD-10-CM | POA: Diagnosis present

## 2021-05-27 DIAGNOSIS — R5383 Other fatigue: Principal | ICD-10-CM

## 2021-05-27 DIAGNOSIS — G40909 Epilepsy, unspecified, not intractable, without status epilepticus: Secondary | ICD-10-CM

## 2021-05-27 DIAGNOSIS — E1159 Type 2 diabetes mellitus with other circulatory complications: Secondary | ICD-10-CM | POA: Diagnosis present

## 2021-05-27 DIAGNOSIS — Z7901 Long term (current) use of anticoagulants: Secondary | ICD-10-CM

## 2021-05-27 DIAGNOSIS — E785 Hyperlipidemia, unspecified: Secondary | ICD-10-CM

## 2021-05-27 DIAGNOSIS — Z7982 Long term (current) use of aspirin: Secondary | ICD-10-CM

## 2021-05-27 DIAGNOSIS — Z993 Dependence on wheelchair: Secondary | ICD-10-CM

## 2021-05-27 DIAGNOSIS — I2510 Atherosclerotic heart disease of native coronary artery without angina pectoris: Secondary | ICD-10-CM | POA: Diagnosis present

## 2021-05-27 DIAGNOSIS — I480 Paroxysmal atrial fibrillation: Secondary | ICD-10-CM | POA: Diagnosis present

## 2021-05-27 DIAGNOSIS — I152 Hypertension secondary to endocrine disorders: Secondary | ICD-10-CM | POA: Diagnosis present

## 2021-05-27 DIAGNOSIS — E872 Acidosis, unspecified: Secondary | ICD-10-CM | POA: Diagnosis present

## 2021-05-27 DIAGNOSIS — R296 Repeated falls: Secondary | ICD-10-CM | POA: Diagnosis present

## 2021-05-27 DIAGNOSIS — E1169 Type 2 diabetes mellitus with other specified complication: Secondary | ICD-10-CM | POA: Diagnosis present

## 2021-05-27 DIAGNOSIS — E1151 Type 2 diabetes mellitus with diabetic peripheral angiopathy without gangrene: Secondary | ICD-10-CM | POA: Diagnosis present

## 2021-05-27 LAB — CBC WITH DIFFERENTIAL/PLATELET
Abs Immature Granulocytes: 0.29 10*3/uL — ABNORMAL HIGH (ref 0.00–0.07)
Basophils Absolute: 0.1 10*3/uL (ref 0.0–0.1)
Basophils Relative: 0 %
Eosinophils Absolute: 0 10*3/uL (ref 0.0–0.5)
Eosinophils Relative: 0 %
HCT: 45.3 % (ref 39.0–52.0)
Hemoglobin: 14.9 g/dL (ref 13.0–17.0)
Immature Granulocytes: 3 %
Lymphocytes Relative: 9 %
Lymphs Abs: 1.1 10*3/uL (ref 0.7–4.0)
MCH: 31.6 pg (ref 26.0–34.0)
MCHC: 32.9 g/dL (ref 30.0–36.0)
MCV: 96.2 fL (ref 80.0–100.0)
Monocytes Absolute: 0.1 10*3/uL (ref 0.1–1.0)
Monocytes Relative: 1 %
Neutro Abs: 10.1 10*3/uL — ABNORMAL HIGH (ref 1.7–7.7)
Neutrophils Relative %: 87 %
Platelets: 273 10*3/uL (ref 150–400)
RBC: 4.71 MIL/uL (ref 4.22–5.81)
RDW: 14.6 % (ref 11.5–15.5)
WBC: 11.6 10*3/uL — ABNORMAL HIGH (ref 4.0–10.5)
nRBC: 0 % (ref 0.0–0.2)

## 2021-05-27 LAB — BASIC METABOLIC PANEL
Anion gap: 17 — ABNORMAL HIGH (ref 5–15)
BUN: 43 mg/dL — ABNORMAL HIGH (ref 8–23)
CO2: 18 mmol/L — ABNORMAL LOW (ref 22–32)
Calcium: 8.7 mg/dL — ABNORMAL LOW (ref 8.9–10.3)
Chloride: 102 mmol/L (ref 98–111)
Creatinine, Ser: 1.43 mg/dL — ABNORMAL HIGH (ref 0.61–1.24)
GFR, Estimated: 48 mL/min — ABNORMAL LOW (ref >60)
Glucose, Bld: 213 mg/dL — ABNORMAL HIGH (ref 70–99)
Potassium: 4.8 mmol/L (ref 3.5–5.1)
Sodium: 137 mmol/L (ref 135–145)

## 2021-05-27 LAB — MAGNESIUM: Magnesium: 2.6 mg/dL — ABNORMAL HIGH (ref 1.7–2.4)

## 2021-05-27 LAB — TROPONIN I (HIGH SENSITIVITY)
Troponin I (High Sensitivity): 207 ng/L (ref <18)
Troponin I (High Sensitivity): 173 ng/L (ref <18)

## 2021-05-27 MED ORDER — IPRATROPIUM-ALBUTEROL 0.5-2.5 (3) MG/3ML IN SOLN
3.0000 mL | Freq: Once | RESPIRATORY_TRACT | Status: AC
Start: 2021-05-27 — End: 2021-05-27
  Administered 2021-05-27: 3 mL via RESPIRATORY_TRACT
  Filled 2021-05-27: qty 3

## 2021-05-27 MED ORDER — ASPIRIN 325 MG PO TABS
325.0000 mg | ORAL_TABLET | ORAL | Status: AC
  Administered 2021-05-28: 325 mg via ORAL
  Filled 2021-05-27: qty 1

## 2021-05-27 MED ORDER — HEPARIN (PORCINE) 25000 UT/250ML-% IV SOLN
900.0000 [IU]/h | INTRAVENOUS | Status: DC
Start: 2021-05-27 — End: 2021-05-28
  Administered 2021-05-28: 900 [IU]/h via INTRAVENOUS
  Filled 2021-05-27: qty 250

## 2021-05-27 NOTE — Assessment & Plan Note (Signed)
Hold jardiance given AKI. Continue with SSI and lantus. ?

## 2021-05-27 NOTE — Assessment & Plan Note (Signed)
Pt is bradycardic with HR in the 50s. May need to hold/reduce lopressor dose. ?

## 2021-05-27 NOTE — Assessment & Plan Note (Signed)
Chronic. 

## 2021-05-27 NOTE — ED Provider Notes (Signed)
?[facility] ?Provider Note ? ? ?CSN: [id number] ?Arrival date & time: 05/27/21  1926 ? ?  ? ?History ?Chief Complaint  ?Patient presents with  ? Fatigue  ? ? ?Fred [name] is a 86 y.o. male w/ history of CVA with residual left-sided deficits presenting for fatigue and feeling unwell.  Patient never likes to come to the hospital but asked his daughter to bring him because he felt so sick.  He denies any chest pain or shortness of breath.  He feels like he has no energy.  He was recently admitted to [facility] with bronchitis and discharged at that time.  Patient was given breathing treatments during stay and was found to be hypoxic.  He was treated with an antibiotic and now has been back home since that time. ? ?HPI ? ?  ? ?Home Medications ?Prior to Admission medications   ?Medication Sig Start Date End Date Taking? Authorizing Provider  ?acetaminophen (TYLENOL) 325 MG tablet Take 2 tablets (650 mg total) by mouth every 4 (four) hours as needed for mild pain (or temp > 37.5 C (99.5 F)). 09/13/17   Angiulli, Lavon Paganini, PA-C  ?allopurinol (ZYLOPRIM) 300 MG tablet Take 1 tablet (300 mg total) by mouth daily. 07/27/20   [name], MD  ?ASPIRIN EC 81 MG EC tablet Take 81 mg by mouth daily. 07/30/20   [provider]  ?atorvastatin (LIPITOR) 80 MG tablet Take 1 tablet (80 mg total) by mouth daily. 07/27/20   [name], MD  ?baclofen (LIORESAL) 10 MG tablet Take 10 mg by mouth 2 (two) times daily. 09/29/20   [provider]  ?blood glucose meter kit and supplies Check blood sugar 3 times daily. 04/12/18   [name], MD  ?Cholecalciferol (VITAMIN D) 50 MCG (2000 UT) tablet Take 2,000 Units by mouth daily. 04/19/19   [provider]  ?clopidogrel (PLAVIX) 75 MG tablet Take 1 tablet (75 mg total) by mouth daily. 12/02/20   [place] Eric J, DO  ?Coenzyme Q10 (COQ10) 100 MG CAPS Take 300 mg by mouth daily.    [provider]  ?divalproex (DEPAKOTE ER) 500 MG 24 hr tablet TAKE 1 TABLET(500 MG) BY MOUTH DAILY ?Patient taking differently: Take 500 mg by mouth at bedtime. 05/25/20   Cameron Sprang, MD  ?doxepin (SINEQUAN) 10 MG capsule Take 1 capsule (10 mg total) by mouth at bedtime. 12/01/20   [place] Donnamarie Poag, DO  ?escitalopram (LEXAPRO) 10 MG tablet Take 10 mg by mouth daily. 10/28/20   [provider]  ?ezetimibe (ZETIA) 10 MG tablet Take 1 tablet (10 mg total) by mouth daily. 07/27/20   [name], MD  ?insulin glargine, 1 Unit Dial, (TOUJEO SOLOSTAR) 300 UNIT/ML Solostar Pen Inject 22 Units into the skin at bedtime. 07/27/20   [name], MD  ?JARDIANCE 10 MG TABS tablet TAKE 1 TABLET(10 MG) BY MOUTH DAILY BEFORE BREAKFAST ?Patient taking differently: Take 25 mg by mouth daily. 10/23/20   [name], MD  ?Lancets (ONETOUCH DELICA PLUS [id number]) [place]  04/12/18   [provider]  ?linagliptin (TRADJENTA) 5 MG TABS tablet Take 1 tablet (5 mg total) by mouth daily. 07/27/20   [name], MD  ?losartan (COZAAR) 25 MG tablet Take 1 tablet (25 mg total) by mouth daily. 07/27/20   [name], MD  ?Magnesium Oxide 420 MG TABS Take 420 mg by mouth daily. 04/19/19   [provider]  ?  metoprolol succinate (TOPROL-XL) 25 MG 24 hr tablet Take 0.5 tablets (12.5 mg total) by mouth daily. 12/02/20   [place] Donnamarie Poag, DO  ?niacin (NIASPAN) 500 MG CR tablet TAKE 1 TABLET(500 MG) BY MOUTH AT BEDTIME ?Patient taking differently: Take 500 mg by mouth at bedtime. 12/02/19   [name], MD  ?NOVOFINE PEN NEEDLE 32G X 6 MM MISC INJECT INSULIN DAILY 02/07/20   [name], MD  ?ONE TOUCH ULTRA TEST test strip  04/12/18   [provider]  ?senna-docusate (SENOKOT-S) 8.6-50 MG tablet Take 1 tablet by mouth 2 (two) times daily. ?Patient taking differently: Take 1 tablet by mouth as needed for mild constipation. 09/13/17   Angiulli, Lavon Paganini, PA-C  ?tamsulosin  (FLOMAX) 0.4 MG CAPS capsule Take 1 capsule (0.4 mg total) by mouth daily after supper. 12/01/20   [place] Eric J, DO  ?   ? ?Allergies    ?Penicillins and Lisinopril   ? ?Review of Systems   ?Review of Systems  ?Constitutional:  Positive for activity change and fatigue. Negative for fever.  ?Cardiovascular:  Negative for chest pain and palpitations.  ?Skin:  Negative for rash.  ? ?Physical Exam ?Updated Vital Signs ?BP (!) 143/62 (BP Location: Right Arm)   Pulse 60   Temp 97.7 ?F (36.5 ?C) (Oral)   Resp 17   SpO2 97%  ?Physical Exam ?Vitals and nursing note reviewed.  ?Constitutional:   ?   Appearance: Normal appearance.  ?Cardiovascular:  ?   Rate and Rhythm: Normal rate and regular rhythm.  ?Pulmonary:  ?   Effort: Pulmonary effort is normal.  ?   Breath sounds: Wheezing present.  ?Abdominal:  ?   Tenderness: There is no abdominal tenderness. There is no guarding or rebound.  ?Musculoskeletal:     ?   General: No tenderness.  ?Neurological:  ?   Mental Status: He is alert.  ?Psychiatric:     ?   Mood and Affect: Mood normal.     ?   Behavior: Behavior normal.  ? ? ?ED Results / Procedures / Treatments   ?Labs ?(all labs ordered are listed, but only abnormal results are displayed) ?Labs Reviewed  ?CBC WITH DIFFERENTIAL/PLATELET - Abnormal; Notable for the following components:  ?    Result Value  ? WBC 11.6 (*)   ? Neutro Abs 10.1 (*)   ? Abs Immature Granulocytes 0.29 (*)   ? All other components within normal limits  ?BASIC METABOLIC PANEL - Abnormal; Notable for the following components:  ? CO2 18 (*)   ? Glucose, Bld 213 (*)   ? BUN 43 (*)   ? Creatinine, Ser 1.43 (*)   ? Calcium 8.7 (*)   ? GFR, Estimated 48 (*)   ? Anion gap 17 (*)   ? All other components within normal limits  ?MAGNESIUM - Abnormal; Notable for the following components:  ? Magnesium 2.6 (*)   ? All other components within normal limits  ?TROPONIN I (HIGH SENSITIVITY) - Abnormal; Notable for the following components:  ? Troponin I (High  Sensitivity) 207 (*)   ? All other components within normal limits  ?URINALYSIS, ROUTINE W REFLEX MICROSCOPIC  ?TROPONIN I (HIGH SENSITIVITY)  ? ? ?EKG ?EKG Interpretation ? ?Date/Time:  Thursday May 27 2021 19:50:57 EDT ?Ventricular Rate:  58 ?PR Interval:  146 ?QRS Duration: 98 ?QT Interval:  458 ?QTC Calculation: 449 ?R Axis:   -38 ?Text Interpretation: Sinus bradycardia Left axis deviation T wave abnormality Abnormal  ECG Confirmed by Carmin Muskrat [phone]) on 05/27/2021 9:19:24 PM ? ?Radiology ?DG Chest 2 View ? ?Result Date: 05/27/2021 ?CLINICAL DATA:  Dyspnea. EXAM: CHEST - 2 VIEW COMPARISON:  None. FINDINGS: The lateral views limited in evaluation secondary to positioning of the patient's upper extremity. Multiple sternal wires and vascular clips are seen. Low lung volumes are noted. Very mild atelectasis is noted within the bilateral lung bases. There is no evidence of a pleural effusion or pneumothorax. The heart size and mediastinal contours are within normal limits. There is marked severity calcification of the aortic arch. Degenerative changes seen throughout the thoracic spine. IMPRESSION: 1. Evidence of prior median sternotomy/CABG. 2. Very mild bibasilar atelectasis. Electronically Signed   By: Virgina Norfolk M.D.   On: 05/27/2021 20:19   ? ?Procedures ?Procedures  ? ?Medications Ordered in ED ?Medications - No data to display ? ?ED Course/ Medical Decision Making/ A&P ?  ?                        ?Medical Decision Making ?Risk ?Prescription drug management. ?Decision regarding hospitalization. ? ? ?86 year old male with history of CVA with left-sided hemiparesis, 4v CABG, diabetes, hyperlipidemia, prior tobacco use, diastolic heart failure, who is wheelchair-bound, and recent admission from 3/11-15-10 at [facility] for acute hypoxic respiratory failure (on chart review) team for increased fatigue and feeling unwell.  Was thought to have possible asthma at that time. ? ?Patient in no acute  distress on arrival.  Bradycardic.  Labs remarkable for elevated troponin of 207. Elevated magnesium of 2.6, leukocytosis of 11.6 with neutrophil predominance.  Glucose of 213, BUN of 43, creatinine of 1.43, and

## 2021-05-27 NOTE — H&P (Addendum)
?History and Physical  ? ? ?Fred [name] BWG:[id number] DOB: Oct 16, 1935 DOA: 05/27/2021 ? ?DOS: the patient was seen and examined on 05/27/2021 ? ?[facility]  ? ?Patient coming from: Home ? ?I have personally briefly reviewed patient's old medical records in [place] ? ?CC: fatigue ?HPI: ?86 year old male with history of type 2 diabetes on insulin, history of stroke with resultant left-sided hemiparesis, hypertension, history of CABG, history of seizure disorder, wheelchair-bound, hyperlipidemia, chronic diastolic heart failure presents to the ER today with continued fatigue. ? ?Patient was recently admitted to [facility] from May 20, 2021 through May 23, 2021.  He was admitted there for asthma/bronchitis.  Patient's daughter Butch Penny denies the patient having a history of asthma.  He also had some mild lactic acidosis.  This was treated with steroids.  He had some agitation at night and was ultimately discharged. ? ?On the night of discharge, daughter states the patient had fallen on the ground.  He was found the next morning by his family.  Patient lives in a house next door to his son.  His grandsons take turns staying with him at night.  His grandsons are older teenagers and young 79 year olds. ? ?Since his fall, the patient's, continue to complain of fatigue.  Patient is nonambulatory.  He is wheelchair-bound.  He has not had any specific chest pain but does continue to complain of fatigue.  Patient daughter Butch Penny states the patient never complains of wanted, the hospital.  So she found extremely concerning today when the patient asked to be sent to the hospital for evaluation. ? ?Appetite has been somewhat poor this week but no nausea, vomiting or diarrhea. ? ?Patient is no longer taking Eliquis.  He was on Eliquis in the past.  Was finally determined that his A-fib was postoperative from his CABG in 2005 and Eliquis was stopped.  He remains on Plavix for his  history of peripheral vascular disease and prior stroke. ? ?On admission the ER, temp 97.7 heart rate 60 blood pressure 143/62 satting 97% on room air. ? ?Labs sodium 137, bicarb 18, BUN of 43, creatinine 1.43, magnesium 2.6 ? ?White count 11.6, hemoglobin 14.9, platelets 273 ? ?EKG was sinus bradycardia, T wave inversions in lead III and aVF ? ?Initial troponin I of 207, repeat troponin of 173 ? ?[facility] hospitalist contacted for admission.  ? ?ED Course: elevated troponin. CXR negative Scr elevated ?  ?Review of Systems:  ?Review of Systems  ?Unable to perform ROS: Mental acuity  ?Constitutional:  Positive for malaise/fatigue. Negative for chills and fever.  ?HENT: Negative.    ?Eyes: Negative.   ?Respiratory:  Negative for cough.   ?Cardiovascular: Negative.   ?Gastrointestinal: Negative.   ?Genitourinary: Negative.   ?Musculoskeletal: Negative.   ?Skin: Negative.   ?Neurological:   ?     Chronic left sided weakness since CVA ?Wheelchair bound  ?Endo/Heme/Allergies: Negative.   ?Psychiatric/Behavioral: Negative.    ?All other systems reviewed and are negative. ROS obtained by dtr donna ? ?Past Medical History:  ?Diagnosis Date  ? Acute CVA (cerebrovascular accident) ([place]) 11/13/2020  ? Atrial fibrillation ([place])   ? Post operative  ? CAD (coronary artery disease)   ? s/p cabg  ? CHF (congestive heart failure) ([place])   ? Diverticulosis   ? DJD (degenerative joint disease)   ? DM (diabetes mellitus) ([place])   ? History of anemia   ? History of stroke   ? Hyperlipidemia   ? Hypertension   ?  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 Peripheral vascular disease ([place])   ? Polyneuropathy, diabetic ([place])   ? Seizure disorder, complex partial ([place])   ? Stroke (cerebrum) ([place]) 03/21/2017  ? Syncope and collapse   ? ? ?Past Surgical History:  ?Procedure Laterality Date  ? CATARACT EXTRACTION W/ INTRAOCULAR LENS  IMPLANT, BILATERAL  06/2013  ? Dr. [name]  ? CORONARY ARTERY BYPASS GRAFT  02/13/2004  ? 4 vesel  ?  removal of sebaceous cyst from neck  08/2007  ? Dr. [name]  ? TONSILLECTOMY    ? ? ? reports that he quit smoking about 44 years ago. His smoking use included cigarettes. He has a 22.00 pack-year smoking history. He has never used smokeless tobacco. He reports that he does not currently use alcohol. He reports that he does not use drugs. ? ?Allergies  ?Allergen Reactions  ? Penicillins Anaphylaxis  ? Lisinopril Cough  ? ? ?Family History  ?Problem Relation Age of Onset  ? Epilepsy Father   ? Stroke Mother   ? Lung cancer Other   ? ? ?Prior to Admission medications   ?Medication Sig Start Date End Date Taking? Authorizing Provider  ?acetaminophen (TYLENOL) 325 MG tablet Take 2 tablets (650 mg total) by mouth every 4 (four) hours as needed for mild pain (or temp > 37.5 C (99.5 F)). 09/13/17  Yes Angiulli, Lavon Paganini, PA-C  ?albuterol (VENTOLIN HFA) 108 (90 Base) MCG/ACT inhaler Inhale 1-2 puffs into the lungs every 6 (six) hours as needed for wheezing. 05/23/21  Yes [provider]  ?allopurinol (ZYLOPRIM) 300 MG tablet Take 1 tablet (300 mg total) by mouth daily. 07/27/20  Yes [name], MD  ?amoxicillin-clavulanate (AUGMENTIN) 875-125 MG tablet Take 1 tablet by mouth See admin instructions. Bid x 7 days 05/23/21  Yes [provider]  ?ASPIRIN EC 81 MG EC tablet Take 81 mg by mouth daily. 07/30/20  Yes [provider]  ?atorvastatin (LIPITOR) 80 MG tablet Take 1 tablet (80 mg total) by mouth daily. 07/27/20  Yes [name], MD  ?blood glucose meter kit and supplies Check blood sugar 3 times daily. 04/12/18  Yes [name], MD  ?Cholecalciferol (VITAMIN D) 50 MCG (2000 UT) tablet Take 2,000 Units by mouth daily. 04/19/19  Yes [provider]  ?clopidogrel (PLAVIX) 75 MG tablet Take 1 tablet (75 mg total) by mouth daily. 12/02/20  Yes [place] Donnamarie Poag, DO  ?Coenzyme Q10 (COQ10) 100 MG CAPS Take 300 mg by mouth at bedtime.   Yes [provider]   ?divalproex (DEPAKOTE ER) 500 MG 24 hr tablet TAKE 1 TABLET(500 MG) BY MOUTH DAILY ?Patient taking differently: Take 500 mg by mouth at bedtime. 05/25/20  Yes Cameron Sprang, MD  ?doxepin (SINEQUAN) 10 MG capsule Take 1 capsule (10 mg total) by mouth at bedtime. 12/01/20  Yes [place] Shaniece Bussa J, DO  ?ELIQUIS 5 MG TABS tablet Take 5 mg by mouth 2 (two) times daily. 01/04/21  Yes [provider]  ?escitalopram (LEXAPRO) 10 MG tablet Take 10 mg by mouth daily. 10/28/20  Yes [provider]  ?ezetimibe (ZETIA) 10 MG tablet Take 1 tablet (10 mg total) by mouth daily. 07/27/20  Yes [name], MD  ?guaiFENesin (MUCINEX PO) Take 600 mg by mouth 2 (two) times daily as needed (cough).   Yes [provider]  ?insulin glargine, 1 Unit Dial, (TOUJEO SOLOSTAR) 300 UNIT/ML Solostar Pen Inject 22 Units into the skin  at bedtime. 07/27/20  Yes [name], MD  ?JARDIANCE 10 MG TABS tablet TAKE 1 TABLET(10 MG) BY MOUTH DAILY BEFORE BREAKFAST ?Patient taking differently: Take 25 mg by mouth daily. 10/23/20  Yes [name], MD  ?Lancets (ONETOUCH DELICA PLUS CODIRY78G) [place]  04/12/18  Yes [provider]  ?linagliptin (TRADJENTA) 5 MG TABS tablet Take 1 tablet (5 mg total) by mouth daily. 07/27/20  Yes [name], MD  ?losartan (COZAAR) 50 MG tablet Take by mouth. 01/04/21  Yes [provider]  ?Magnesium Oxide 420 MG TABS Take 420 mg by mouth daily. 04/19/19  Yes [provider]  ?metFORMIN (GLUCOPHAGE-XR) 500 MG 24 hr tablet Take 500 mg by mouth daily. 01/05/21  Yes [provider]  ?metoprolol tartrate (LOPRESSOR) 50 MG tablet Take 50 mg by mouth 2 (two) times daily. 01/04/21  Yes [provider]  ?niacin (NIASPAN) 500 MG CR tablet TAKE 1 TABLET(500 MG) BY MOUTH AT BEDTIME ?Patient taking differently: Take 500 mg by mouth at bedtime. 12/02/19  Yes [name], MD  ?NOVOFINE PEN NEEDLE 32G X 6 MM MISC INJECT INSULIN DAILY  02/07/20  Yes [name], MD  ?ONE TOUCH ULTRA TEST test strip  04/12/18  Yes [provider]  ?senna-docusate (SENOKOT-S) 8.6-50 MG tablet Take 1 tablet by mouth 2 (two) times daily. ?Patient taking d

## 2021-05-27 NOTE — Assessment & Plan Note (Addendum)
Admit to observation cardiac telemetry bed. Pt taking plavix at home. Not taking Eliquis. Determined that his afib was post-operative from his CABG in 2005. Trend troponin. EDP has consulted cardiology. Defer to cards if they want to start heparin gtts. Check echo in the AM. ?

## 2021-05-27 NOTE — ED Provider Triage Note (Signed)
Emergency Medicine Provider Triage Evaluation Note ? ?GID SCHOFFSTALL , a 86 y.o. male  was evaluated in triage.  Pt complains of fatigue, shortness of breath of the past 2 to 3 days.  Patient was recently admitted into the hospital for acute respiratory failure.  Patient reports since being discharged he was doing well until the past few days.  He states his kids requested he come into the emergency room for evaluation.  Patient is satting well on room air and is currently without acute distress.  He denies chest pain, palpitations, lightheadedness. ? ?Review of Systems  ?Positive: As above ?Negative: As above ? ?Physical Exam  ?BP (!) 143/62 (BP Location: Right Arm)   Pulse 60   Temp 97.7 ?F (36.5 ?C) (Oral)   Resp 17   SpO2 97%  ?Gen:   Awake, no distress   ?Resp:  Normal effort  ?MSK:   Moves extremities without difficulty  ?Other:  Abdomen nontender and nondistended.  Without flank pain. ? ?Medical Decision Making  ?Medically screening exam initiated at 7:37 PM.  Appropriate orders placed.  OLUWATOBI VISSER was informed that the remainder of the evaluation will be completed by another provider, this initial triage assessment does not replace that evaluation, and the importance of remaining in the ED until their evaluation is complete. ? ? ?  ?Evlyn Courier, PA-C ?05/27/21 1939 ? ?

## 2021-05-27 NOTE — Assessment & Plan Note (Signed)
Check lipid panel  

## 2021-05-27 NOTE — ED Triage Notes (Signed)
BIB EMS to ED triage for further evaluation of malaise. EMS reports be recently had bronchitis last week for 2 days. States he has not bee feeling himself since discharge, that he is more lethargic than usual with no energy.  ?

## 2021-05-27 NOTE — Assessment & Plan Note (Signed)
Appears euvolemic  

## 2021-05-27 NOTE — Assessment & Plan Note (Addendum)
On toprol-xl, statin and plavix at home. ?

## 2021-05-27 NOTE — Assessment & Plan Note (Signed)
Gentle IVF given hx of diastolic CHF. Hold ARB. ?

## 2021-05-27 NOTE — Progress Notes (Signed)
ANTICOAGULATION CONSULT NOTE - Initial Consult ? ?Pharmacy Consult for heparin ?Indication: chest pain/ACS ? ?Allergies  ?Allergen Reactions  ? Penicillins Anaphylaxis  ? Lisinopril Cough  ? ? ?Patient Measurements: ?  ?Heparin Dosing Weight: 72kg ? ?Vital Signs: ?Temp: 97.7 ?F (36.5 ?C) (03/16 1929) ?Temp Source: Oral (03/16 1929) ?BP: 117/53 (03/16 2200) ?Pulse Rate: 53 (03/16 2200) ? ?Labs: ?Recent Labs  ?  05/27/21 ?2006  ?HGB 14.9  ?HCT 45.3  ?PLT 273  ?CREATININE 1.43*  ?TROPONINIHS 207*  ? ? ?CrCl cannot be calculated (Unknown ideal weight.). ? ? ?Medical History: ?Past Medical History:  ?Diagnosis Date  ? Atrial fibrillation ([place])   ? Post operative  ? CAD (coronary artery disease)   ? s/p cabg  ? CHF (congestive heart failure) ([place])   ? Diverticulosis   ? DJD (degenerative joint disease)   ? DM (diabetes mellitus) ([place])   ? History of anemia   ? History of stroke   ? Hyperlipidemia   ? Hypertension   ? 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 Peripheral vascular disease ([place])   ? Polyneuropathy, diabetic ([place])   ? Seizure disorder, complex partial ([place])   ? Syncope and collapse   ? ? ? ?Assessment: ?71 YOM presenting with fatigue, elevated troponin, hx of afib on Eliquis PTA with last dose taken 3/16 '@1130'$  ? ?Goal of Therapy:  ?Heparin level 0.3-0.7 units/ml ?Monitor platelets by anticoagulation protocol: Yes ?  ?Plan:  ?Heparin gtt at 900 units/hr, no bolus ?F/u 8 hour aPTT/HL ?F/u cards plan ? ?Bertis Ruddy, PharmD ?Clinical Pharmacist ?ED Pharmacist Phone # [phone] ?05/27/2021 11:13 PM ? ? ? ?

## 2021-05-27 NOTE — Subjective & Objective (Signed)
CC: fatigue ?HPI: ?86 year old male with history of type 2 diabetes on insulin, history of stroke with resultant left-sided hemiparesis, hypertension, history of CABG, history of seizure disorder, wheelchair-bound, hyperlipidemia, chronic diastolic heart failure presents to the ER today with continued fatigue. ? ?Patient was recently admitted to [facility] from May 20, 2021 through May 23, 2021.  He was admitted there for asthma/bronchitis.  Patient's daughter Butch Penny denies the patient having a history of asthma.  He also had some mild lactic acidosis.  This was treated with steroids.  He had some agitation at night and was ultimately discharged. ? ?On the night of discharge, daughter states the patient had fallen on the ground.  He was found the next morning by his family.  Patient lives in a house next door to his son.  His grandsons take turns staying with him at night.  His grandsons are older teenagers and young 51 year olds. ? ?Since his fall, the patient's, continue to complain of fatigue.  Patient is nonambulatory.  He is wheelchair-bound.  He has not had any specific chest pain but does continue to complain of fatigue.  Patient daughter Butch Penny states the patient never complains of wanted, the hospital.  So she found extremely concerning today when the patient asked to be sent to the hospital for evaluation. ? ?Appetite has been somewhat poor this week but no nausea, vomiting or diarrhea. ? ?Patient is no longer taking Eliquis.  He was on Eliquis in the past.  Was finally determined that his A-fib was postoperative from his CABG in 2005 and Eliquis was stopped.  He remains on Plavix for his history of peripheral vascular disease and prior stroke. ? ?On admission the ER, temp 97.7 heart rate 60 blood pressure 143/62 satting 97% on room air. ? ?Labs sodium 137, bicarb 18, BUN of 43, creatinine 1.43, magnesium 2.6 ? ?White count 11.6, hemoglobin 14.9, platelets 273 ? ?EKG was sinus  bradycardia, T wave inversions in lead III and aVF ? ?Initial troponin I of 207, repeat troponin of 173 ? ?[facility] hospitalist contacted for admission. ?

## 2021-05-27 NOTE — Assessment & Plan Note (Signed)
Chronic. Pt has paresis of both left UE and LE. Pt is wheelchair bound. ?

## 2021-05-27 NOTE — Assessment & Plan Note (Signed)
Verified DNR/DNI status with pt's dtr donna ?

## 2021-05-28 ENCOUNTER — Observation Stay (HOSPITAL_COMMUNITY): Payer: Medicare Other

## 2021-05-28 DIAGNOSIS — E785 Hyperlipidemia, unspecified: Secondary | ICD-10-CM | POA: Diagnosis present

## 2021-05-28 DIAGNOSIS — Z7984 Long term (current) use of oral hypoglycemic drugs: Secondary | ICD-10-CM | POA: Diagnosis not present

## 2021-05-28 DIAGNOSIS — I480 Paroxysmal atrial fibrillation: Secondary | ICD-10-CM | POA: Diagnosis present

## 2021-05-28 DIAGNOSIS — Z993 Dependence on wheelchair: Secondary | ICD-10-CM | POA: Diagnosis not present

## 2021-05-28 DIAGNOSIS — M1990 Unspecified osteoarthritis, unspecified site: Secondary | ICD-10-CM | POA: Diagnosis present

## 2021-05-28 DIAGNOSIS — I248 Other forms of acute ischemic heart disease: Secondary | ICD-10-CM | POA: Diagnosis present

## 2021-05-28 DIAGNOSIS — Z7982 Long term (current) use of aspirin: Secondary | ICD-10-CM | POA: Diagnosis not present

## 2021-05-28 DIAGNOSIS — R778 Other specified abnormalities of plasma proteins: Secondary | ICD-10-CM | POA: Diagnosis present

## 2021-05-28 DIAGNOSIS — E1169 Type 2 diabetes mellitus with other specified complication: Secondary | ICD-10-CM | POA: Diagnosis present

## 2021-05-28 DIAGNOSIS — I2510 Atherosclerotic heart disease of native coronary artery without angina pectoris: Secondary | ICD-10-CM | POA: Diagnosis present

## 2021-05-28 DIAGNOSIS — E872 Acidosis, unspecified: Secondary | ICD-10-CM | POA: Diagnosis present

## 2021-05-28 DIAGNOSIS — I69354 Hemiplegia and hemiparesis following cerebral infarction affecting left non-dominant side: Secondary | ICD-10-CM | POA: Diagnosis not present

## 2021-05-28 DIAGNOSIS — Z66 Do not resuscitate: Secondary | ICD-10-CM | POA: Diagnosis present

## 2021-05-28 DIAGNOSIS — I152 Hypertension secondary to endocrine disorders: Secondary | ICD-10-CM | POA: Diagnosis present

## 2021-05-28 DIAGNOSIS — E1151 Type 2 diabetes mellitus with diabetic peripheral angiopathy without gangrene: Secondary | ICD-10-CM | POA: Diagnosis present

## 2021-05-28 DIAGNOSIS — N179 Acute kidney failure, unspecified: Secondary | ICD-10-CM | POA: Diagnosis present

## 2021-05-28 DIAGNOSIS — Z7901 Long term (current) use of anticoagulants: Secondary | ICD-10-CM | POA: Diagnosis not present

## 2021-05-28 DIAGNOSIS — G40909 Epilepsy, unspecified, not intractable, without status epilepticus: Secondary | ICD-10-CM | POA: Diagnosis present

## 2021-05-28 DIAGNOSIS — E876 Hypokalemia: Secondary | ICD-10-CM | POA: Diagnosis present

## 2021-05-28 DIAGNOSIS — R296 Repeated falls: Secondary | ICD-10-CM | POA: Diagnosis present

## 2021-05-28 DIAGNOSIS — R54 Age-related physical debility: Secondary | ICD-10-CM | POA: Diagnosis present

## 2021-05-28 DIAGNOSIS — K5790 Diverticulosis of intestine, part unspecified, without perforation or abscess without bleeding: Secondary | ICD-10-CM | POA: Diagnosis present

## 2021-05-28 DIAGNOSIS — I5032 Chronic diastolic (congestive) heart failure: Secondary | ICD-10-CM

## 2021-05-28 LAB — CBC WITH DIFFERENTIAL/PLATELET
Abs Immature Granulocytes: 0.31 10*3/uL — ABNORMAL HIGH (ref 0.00–0.07)
Basophils Absolute: 0.1 10*3/uL (ref 0.0–0.1)
Basophils Relative: 0 %
Eosinophils Absolute: 0 10*3/uL (ref 0.0–0.5)
Eosinophils Relative: 0 %
HCT: 42.4 % (ref 39.0–52.0)
Hemoglobin: 14.3 g/dL (ref 13.0–17.0)
Immature Granulocytes: 2 %
Lymphocytes Relative: 12 %
Lymphs Abs: 1.7 10*3/uL (ref 0.7–4.0)
MCH: 31.3 pg (ref 26.0–34.0)
MCHC: 33.7 g/dL (ref 30.0–36.0)
MCV: 92.8 fL (ref 80.0–100.0)
Monocytes Absolute: 0.6 10*3/uL (ref 0.1–1.0)
Monocytes Relative: 4 %
Neutro Abs: 11.2 10*3/uL — ABNORMAL HIGH (ref 1.7–7.7)
Neutrophils Relative %: 82 %
Platelets: 242 10*3/uL (ref 150–400)
RBC: 4.57 MIL/uL (ref 4.22–5.81)
RDW: 14.6 % (ref 11.5–15.5)
WBC: 13.9 10*3/uL — ABNORMAL HIGH (ref 4.0–10.5)
nRBC: 0 % (ref 0.0–0.2)

## 2021-05-28 LAB — MAGNESIUM: Magnesium: 2.5 mg/dL — ABNORMAL HIGH (ref 1.7–2.4)

## 2021-05-28 LAB — GLUCOSE, CAPILLARY
Glucose-Capillary: 109 mg/dL — ABNORMAL HIGH (ref 70–99)
Glucose-Capillary: 139 mg/dL — ABNORMAL HIGH (ref 70–99)
Glucose-Capillary: 165 mg/dL — ABNORMAL HIGH (ref 70–99)
Glucose-Capillary: 186 mg/dL — ABNORMAL HIGH (ref 70–99)
Glucose-Capillary: 219 mg/dL — ABNORMAL HIGH (ref 70–99)

## 2021-05-28 LAB — COMPREHENSIVE METABOLIC PANEL
ALT: 21 U/L (ref 0–44)
AST: 18 U/L (ref 15–41)
Albumin: 2.4 g/dL — ABNORMAL LOW (ref 3.5–5.0)
Alkaline Phosphatase: 39 U/L (ref 38–126)
Anion gap: 17 — ABNORMAL HIGH (ref 5–15)
BUN: 44 mg/dL — ABNORMAL HIGH (ref 8–23)
CO2: 21 mmol/L — ABNORMAL LOW (ref 22–32)
Calcium: 8.4 mg/dL — ABNORMAL LOW (ref 8.9–10.3)
Chloride: 100 mmol/L (ref 98–111)
Creatinine, Ser: 1.42 mg/dL — ABNORMAL HIGH (ref 0.61–1.24)
GFR, Estimated: 49 mL/min — ABNORMAL LOW (ref >60)
Glucose, Bld: 193 mg/dL — ABNORMAL HIGH (ref 70–99)
Potassium: 4.5 mmol/L (ref 3.5–5.1)
Sodium: 138 mmol/L (ref 135–145)
Total Bilirubin: 1.2 mg/dL (ref 0.3–1.2)
Total Protein: 5.6 g/dL — ABNORMAL LOW (ref 6.5–8.1)

## 2021-05-28 LAB — HEMOGLOBIN A1C
Hgb A1c MFr Bld: 7.5 % — ABNORMAL HIGH (ref 4.8–5.6)
Mean Plasma Glucose: 168.55 mg/dL

## 2021-05-28 LAB — URINALYSIS, ROUTINE W REFLEX MICROSCOPIC
Bacteria, UA: NONE SEEN
Bilirubin Urine: NEGATIVE
Glucose, UA: >=500 mg/dL — AB
Hgb urine dipstick: NEGATIVE
Ketones, ur: 20 mg/dL — AB
Leukocytes,Ua: NEGATIVE
Nitrite: NEGATIVE
Protein, ur: NEGATIVE mg/dL
Specific Gravity, Urine: 1.024 (ref 1.005–1.030)
pH: 5 (ref 5.0–8.0)

## 2021-05-28 LAB — LIPID PANEL
Cholesterol: 152 mg/dL (ref 0–200)
HDL: 35 mg/dL — ABNORMAL LOW (ref >40)
LDL Cholesterol: 84 mg/dL (ref 0–99)
Total CHOL/HDL Ratio: 4.3 RATIO
Triglycerides: 163 mg/dL — ABNORMAL HIGH (ref <150)
VLDL: 33 mg/dL (ref 0–40)

## 2021-05-28 LAB — ECHOCARDIOGRAM COMPLETE
AR max vel: 2.56 cm2
AV Peak grad: 9.5 mmHg
Ao pk vel: 1.55 m/s
Area-P 1/2: 4.8 cm2
Height: 63 in
S' Lateral: 2.6 cm
Weight: 2828.94 oz

## 2021-05-28 LAB — CK: Total CK: 60 U/L (ref 49–397)

## 2021-05-28 MED ORDER — DIVALPROEX SODIUM ER 500 MG PO TB24
500.0000 mg | ORAL_TABLET | Freq: Every day | ORAL | Status: DC
  Administered 2021-05-28 – 2021-06-09 (×14): 500 mg via ORAL
  Filled 2021-05-28 (×14): qty 1

## 2021-05-28 MED ORDER — ACETAMINOPHEN 650 MG RE SUPP: 650.0000 mg | Freq: Four times a day (QID) | RECTAL | Status: DC | PRN

## 2021-05-28 MED ORDER — LACTATED RINGERS IV SOLN: INTRAVENOUS | Status: DC

## 2021-05-28 MED ORDER — ALLOPURINOL 300 MG PO TABS
300.0000 mg | ORAL_TABLET | Freq: Every day | ORAL | Status: DC
  Administered 2021-05-28 – 2021-06-10 (×14): 300 mg via ORAL
  Filled 2021-05-28 (×14): qty 1

## 2021-05-28 MED ORDER — NIACIN ER (ANTIHYPERLIPIDEMIC) 500 MG PO TBCR
500.0000 mg | EXTENDED_RELEASE_TABLET | Freq: Every day | ORAL | Status: DC
  Administered 2021-05-28 – 2021-06-09 (×14): 500 mg via ORAL
  Filled 2021-05-28 (×15): qty 1

## 2021-05-28 MED ORDER — ESCITALOPRAM OXALATE 10 MG PO TABS
10.0000 mg | ORAL_TABLET | Freq: Every day | ORAL | Status: DC
  Administered 2021-05-28 – 2021-06-10 (×14): 10 mg via ORAL
  Filled 2021-05-28 (×14): qty 1

## 2021-05-28 MED ORDER — LACTATED RINGERS IV SOLN: INTRAVENOUS | Status: AC

## 2021-05-28 MED ORDER — METOPROLOL TARTRATE 50 MG PO TABS
50.0000 mg | ORAL_TABLET | Freq: Two times a day (BID) | ORAL | Status: DC
Start: 2021-05-28 — End: 2021-06-01
  Administered 2021-05-28 – 2021-06-01 (×8): 50 mg via ORAL
  Filled 2021-05-28 (×8): qty 1

## 2021-05-28 MED ORDER — CLOPIDOGREL BISULFATE 75 MG PO TABS
75.0000 mg | ORAL_TABLET | Freq: Every day | ORAL | Status: DC
  Administered 2021-05-28 – 2021-06-10 (×14): 75 mg via ORAL
  Filled 2021-05-28 (×14): qty 1

## 2021-05-28 MED ORDER — INSULIN GLARGINE 100 UNIT/ML ~~LOC~~ SOLN
20.0000 [IU] | Freq: Every day | SUBCUTANEOUS | Status: DC
  Administered 2021-05-28 – 2021-06-05 (×10): 20 [IU] via SUBCUTANEOUS
  Filled 2021-05-28 (×11): qty 0.2

## 2021-05-28 MED ORDER — ATORVASTATIN CALCIUM 80 MG PO TABS
80.0000 mg | ORAL_TABLET | Freq: Every day | ORAL | Status: DC
  Administered 2021-05-28 – 2021-06-10 (×14): 80 mg via ORAL
  Filled 2021-05-28 (×14): qty 1

## 2021-05-28 MED ORDER — MELATONIN 5 MG PO TABS
10.0000 mg | ORAL_TABLET | Freq: Every evening | ORAL | Status: DC | PRN
  Filled 2021-05-28: qty 2

## 2021-05-28 MED ORDER — EZETIMIBE 10 MG PO TABS
10.0000 mg | ORAL_TABLET | Freq: Every day | ORAL | Status: DC
  Administered 2021-05-28 – 2021-06-10 (×14): 10 mg via ORAL
  Filled 2021-05-28 (×14): qty 1

## 2021-05-28 MED ORDER — INSULIN ASPART 100 UNIT/ML IJ SOLN
0.0000 [IU] | Freq: Every day | INTRAMUSCULAR | Status: DC
  Administered 2021-05-28: 2 [IU] via SUBCUTANEOUS
  Administered 2021-05-30: 4 [IU] via SUBCUTANEOUS
  Administered 2021-06-03: 2 [IU] via SUBCUTANEOUS
  Administered 2021-06-04: 3 [IU] via SUBCUTANEOUS
  Administered 2021-06-05: 2 [IU] via SUBCUTANEOUS
  Administered 2021-06-07: 4 [IU] via SUBCUTANEOUS
  Administered 2021-06-08 – 2021-06-09 (×2): 2 [IU] via SUBCUTANEOUS

## 2021-05-28 MED ORDER — ENOXAPARIN SODIUM 40 MG/0.4ML IJ SOSY
40.0000 mg | PREFILLED_SYRINGE | INTRAMUSCULAR | Status: DC
  Administered 2021-05-28 – 2021-06-09 (×13): 40 mg via SUBCUTANEOUS
  Filled 2021-05-28 (×14): qty 0.4

## 2021-05-28 MED ORDER — INSULIN ASPART 100 UNIT/ML IJ SOLN
0.0000 [IU] | Freq: Three times a day (TID) | INTRAMUSCULAR | Status: DC
  Administered 2021-05-28: 2 [IU] via SUBCUTANEOUS
  Administered 2021-05-28 – 2021-05-29 (×2): 3 [IU] via SUBCUTANEOUS
  Administered 2021-05-29 (×2): 2 [IU] via SUBCUTANEOUS
  Administered 2021-05-30 (×2): 3 [IU] via SUBCUTANEOUS
  Administered 2021-05-30 – 2021-05-31 (×2): 2 [IU] via SUBCUTANEOUS
  Administered 2021-05-31 – 2021-06-01 (×3): 3 [IU] via SUBCUTANEOUS
  Administered 2021-06-01: 2 [IU] via SUBCUTANEOUS
  Administered 2021-06-01 – 2021-06-02 (×2): 3 [IU] via SUBCUTANEOUS
  Administered 2021-06-03: 2 [IU] via SUBCUTANEOUS
  Administered 2021-06-03 – 2021-06-04 (×4): 3 [IU] via SUBCUTANEOUS
  Administered 2021-06-04: 5 [IU] via SUBCUTANEOUS
  Administered 2021-06-05: 2 [IU] via SUBCUTANEOUS
  Administered 2021-06-05: 8 [IU] via SUBCUTANEOUS
  Administered 2021-06-05 – 2021-06-06 (×3): 2 [IU] via SUBCUTANEOUS
  Administered 2021-06-06: 3 [IU] via SUBCUTANEOUS
  Administered 2021-06-07: 2 [IU] via SUBCUTANEOUS
  Administered 2021-06-07: 5 [IU] via SUBCUTANEOUS
  Administered 2021-06-07: 3 [IU] via SUBCUTANEOUS
  Administered 2021-06-08: 8 [IU] via SUBCUTANEOUS
  Administered 2021-06-08 – 2021-06-09 (×2): 3 [IU] via SUBCUTANEOUS
  Administered 2021-06-09: 2 [IU] via SUBCUTANEOUS
  Administered 2021-06-09 – 2021-06-10 (×2): 3 [IU] via SUBCUTANEOUS
  Administered 2021-06-10: 5 [IU] via SUBCUTANEOUS

## 2021-05-28 MED ORDER — DOXEPIN HCL 10 MG PO CAPS
10.0000 mg | ORAL_CAPSULE | Freq: Every day | ORAL | Status: DC
  Administered 2021-05-28 – 2021-06-09 (×14): 10 mg via ORAL
  Filled 2021-05-28 (×15): qty 1

## 2021-05-28 MED ORDER — ASPIRIN EC 81 MG PO TBEC
81.0000 mg | DELAYED_RELEASE_TABLET | Freq: Every day | ORAL | Status: DC
  Administered 2021-05-28 – 2021-06-10 (×14): 81 mg via ORAL
  Filled 2021-05-28 (×14): qty 1

## 2021-05-28 MED ORDER — TAMSULOSIN HCL 0.4 MG PO CAPS
0.4000 mg | ORAL_CAPSULE | Freq: Every day | ORAL | Status: DC
  Administered 2021-05-28 – 2021-06-09 (×13): 0.4 mg via ORAL
  Filled 2021-05-28 (×14): qty 1

## 2021-05-28 MED ORDER — ACETAMINOPHEN 325 MG PO TABS: 650.0000 mg | ORAL_TABLET | Freq: Four times a day (QID) | ORAL | Status: DC | PRN

## 2021-05-28 MED ORDER — ONDANSETRON HCL 4 MG PO TABS: 4.0000 mg | ORAL_TABLET | Freq: Four times a day (QID) | ORAL | Status: DC | PRN

## 2021-05-28 MED ORDER — ONDANSETRON HCL 4 MG/2ML IJ SOLN: 4.0000 mg | Freq: Four times a day (QID) | INTRAMUSCULAR | Status: DC | PRN

## 2021-05-28 NOTE — Progress Notes (Signed)
Patient independently interviewed and examined.  I have reviewed the full consultation note from Dr. Alveta Heimlich early this morning.  I agree with her findings.  I have reviewed the patient's echo images with the formal interpretation still pending.  His echocardiogram by my evaluation shows normal LV function with no regional wall motion abnormalities, intact LVEF, degenerative calcific changes of the aortic valve with mild aortic stenosis, and no other pertinent findings.  The patient presents with progressive weakness and debility.  He has no specific cardiac complaints and he denies any chest pain or pressure.  He has fairly marked debilitation and has been wheelchair-bound for over a year.  He comes in because of increasing weakness.  He has been found to have mild elevation of high-sensitivity troponin.  He has no symptoms of acute coronary syndrome.  His medications include atorvastatin, clopidogrel, and an ARB.  No other cardiac evaluation is indicated at this time.  Cardiology will sign off.  Please call if any questions arise. ? ?Sherren Mocha ?05/28/2021 ?10:29 AM ? ?

## 2021-05-28 NOTE — Progress Notes (Addendum)
ANTICOAGULATION CONSULT NOTE - Initial Consult ? ?Pharmacy Consult for Enoxaparin  ?Indication: VTE prophylaxis ? ?Allergies  ?Allergen Reactions  ? Penicillins Anaphylaxis  ? Lisinopril Cough  ? ? ?Patient Measurements: ?Height: '5\' 3"'$  (160 cm) ?Weight: 80.2 kg (176 lb 12.9 oz) ?IBW/kg (Calculated) : 56.9 ? ?Vital Signs: ?Temp: 98.4 ?F (36.9 ?C) (03/17 1128) ?Temp Source: Oral (03/17 1128) ?BP: 131/55 (03/17 1128) ?Pulse Rate: 53 (03/17 1128) ? ?Labs: ?Recent Labs  ?  05/27/21 ?2006 05/27/21 ?2225 05/28/21 ?0330  ?HGB 14.9  --  14.3  ?HCT 45.3  --  42.4  ?PLT 273  --  242  ?CREATININE 1.43*  --  1.42*  ?CKTOTAL  --   --  60  ?TROPONINIHS 207* 173*  --   ? ? ?Estimated Creatinine Clearance: 36.3 mL/min (A) (by C-G formula based on SCr of 1.42 mg/dL (H)). ? ? ?Medical History: ?Past Medical History:  ?Diagnosis Date  ? Acute CVA (cerebrovascular accident) ([place]) 11/13/2020  ? Atrial fibrillation ([place])   ? Post operative  ? CAD (coronary artery disease)   ? s/p cabg  ? CHF (congestive heart failure) ([place])   ? Diverticulosis   ? DJD (degenerative joint disease)   ? DM (diabetes mellitus) ([place])   ? History of anemia   ? History of stroke   ? Hyperlipidemia   ? Hypertension   ? 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 Peripheral vascular disease ([place])   ? Polyneuropathy, diabetic ([place])   ? Seizure disorder, complex partial ([place])   ? Stroke (cerebrum) ([place]) 03/21/2017  ? Syncope and collapse   ? ? ?Assessment: ?86 years of age male evaluated for abnormal troponin without symptoms of ACS. IV heparin was discontinued. Pharmacy consulted to start Lovenox for VTE prophylaxis. Patient was not on anticoagulation prior to admission - he had been on Eliquis in the past but is no longer taking as Afib was determined to be post-operative as per progress note 3/17 per Dr. Kurtis Bushman.  ? ?CBC is stable and within normal limits.  ?SCr is elevated at 1.42 (Baseline !0.7 to 0.9). CrCl ~36 mL/min.  ?No overt bleeding noted.  ? ?Goal  of Therapy:  ?Monitor platelets by anticoagulation protocol: Yes ?  ?Plan:  ?Lovenox 40 SQ daily  ?Monitor renal function and adjust as needed.  ?Monitor CBC, for signs and symptoms of bleeding.  ? ?Sloan Leiter, PharmD, BCPS, BCCCP ?Clinical Pharmacist ?Please refer to [facility] for [place] numbers ?05/28/2021,4:05 PM ? ? ?

## 2021-05-28 NOTE — Progress Notes (Signed)
Echocardiogram ?2D Echocardiogram has been performed. ? ?Fred [name] ?05/28/2021, 8:55 AM ?

## 2021-05-28 NOTE — Plan of Care (Signed)
°  Problem: Clinical Measurements: °Goal: Respiratory complications will improve °Outcome: Progressing °Goal: Cardiovascular complication will be avoided °Outcome: Progressing °  °Problem: Coping: °Goal: Level of anxiety will decrease °Outcome: Progressing °  °Problem: Pain Managment: °Goal: General experience of comfort will improve °Outcome: Progressing °  °Problem: Safety: °Goal: Ability to remain free from injury will improve °Outcome: Progressing °  °

## 2021-05-28 NOTE — TOC Progression Note (Signed)
Transition of Care (TOC) - Progression Note  ? ? ?Patient Details  ?Name: TONYA CARLILE ?MRN: [id number] ?Date of Birth: Sep 16, 1935 ? ?Transition of Care (TOC) CM/SW Contact  ?Izak Anding Renold Don, LCSWA ?Phone Number: ?05/28/2021, 9:08 AM ? ?Clinical Narrative:    ? ?Transition of Care (TOC) Screening Note ? ? ?Patient Details  ?Name: JEHIEL KOEPP ?Date of Birth: [birth year] ? ? ?Transition of Care (TOC) CM/SW Contact:    ?Reece Agar, LCSWA ?Phone Number: ?05/28/2021, 9:09 AM ? ? ? ?Transition of Care Department [facility]) has reviewed patient and no TOC needs have been identified at this time. We will continue to monitor patient advancement through interdisciplinary progression rounds. If new patient transition needs arise, please place a TOC consult. ?  ? ? ?  ?  ? ?Expected Discharge Plan and Services ?  ?  ?  ?  ?  ?                ?  ?  ?  ?  ?  ?  ?  ?  ?  ?  ? ? ?Social Determinants of Health (SDOH) Interventions ?  ? ?Readmission Risk Interventions ?No flowsheet data found. ? ?

## 2021-05-28 NOTE — Evaluation (Addendum)
Occupational Therapy Evaluation ?Patient Details ?Name: Fred [name] ?MRN: [id number] ?DOB: [birth year] ?Today's Date: 05/28/2021 ? ? ?History of Present Illness 86 year old male with history of type 2 diabetes on insulin, history of stroke with resultant left-sided hemiparesis, hypertension, history of CABG, history of seizure disorder, wheelchair-bound, hyperlipidemia, chronic diastolic heart failure presents to the with continued fatigue.  ? ?Clinical Impression ?  ?Patient admitted for the diagnosis above.  PTA he was living at home with 24 hour assist from family.  Unfortunately the patient's functional and mobility status has continued to decline.  Family is stating it is taking up to 3 family members to transfer him to/from his wheelchair/toilet/bed.  The family is inquiring about SNF for short term rehab trial to improve transfers, and for assistance with ultimate discharge disposition depending on progress.  OT will continue efforts in the acute setting, but transition to SNF is recommended given insufficient assist at home.    ?   ? ?Recommendations for follow up therapy are one component of a multi-disciplinary discharge planning process, led by the attending physician.  Recommendations may be updated based on patient status, additional functional criteria and insurance authorization.  ? ?Follow Up Recommendations ? Skilled nursing-short term rehab (<3 hours/day)  ?  ?Assistance Recommended at Discharge Frequent or constant Supervision/Assistance  ?Patient can return home with the following Two people to help with walking and/or transfers;Two people to help with bathing/dressing/bathroom;Direct supervision/assist for medications management;Direct supervision/assist for financial management;Assist for transportation;Assistance with cooking/housework;Help with stairs or ramp for entrance ? ?  ?Functional Status Assessment ? Patient has had a recent decline in their functional status and demonstrates the  ability to make significant improvements in function in a reasonable and predictable amount of time.  ?Equipment Recommendations ? None recommended by OT  ?  ?Recommendations for Other Services   ? ? ?  ?Precautions / Restrictions Precautions ?Precautions: Fall ?Precaution Comments: skin integrity ?Restrictions ?Weight Bearing Restrictions: No  ? ?  ? ?Mobility Bed Mobility ?Overal bed mobility: Needs Assistance ?Bed Mobility: Rolling, Sidelying to Sit, Sit to Sidelying ?Rolling: Mod assist ?Sidelying to sit: Mod assist ?  ?  ?Sit to sidelying: Max assist ?  ?  ? ?Transfers ?  ?  ?  ?  ?  ?  ?  ?  ?  ?General transfer comment: NT ?  ? ?  ?Balance Overall balance assessment: Needs assistance ?Sitting-balance support: Single extremity supported, Feet supported ?Sitting balance-Leahy Scale: Fair ?  ?Postural control: Right lateral lean ?  ?  ?  ?  ?  ?  ?  ?  ?  ?  ?  ?  ?  ?  ?   ? ?ADL either performed or assessed with clinical judgement  ? ?ADL   ?Eating/Feeding: Minimal assistance;Bed level ?  ?Grooming: Wash/dry hands;Wash/dry face;Moderate assistance;Bed level ?  ?  ?  ?  ?  ?Upper Body Dressing : Maximal assistance;Bed level ?  ?Lower Body Dressing: Total assistance;Bed level ?  ?Toilet Transfer: Total assistance ?  ?  ?  ?  ?  ?  ?   ? ? ? ?Vision Patient Visual Report: No change from baseline ?   ?   ?Perception Perception ?Perception: Not tested ?  ?Praxis Praxis ?Praxis: Not tested ?  ? ?Pertinent Vitals/Pain Pain Assessment ?Pain Assessment: No/denies pain  ? ? ? ?Hand Dominance Right ?  ?Extremity/Trunk Assessment Upper Extremity Assessment ?Upper Extremity Assessment: Generalized weakness;LUE deficits/detail ?LUE Deficits / Details: trace AROM and  tighting noted to all pivots of LUE.   May need resting hand splint ?LUE Coordination: decreased fine motor;decreased gross motor ?  ?Lower Extremity Assessment ?Lower Extremity Assessment: Generalized weakness;LLE deficits/detail ?LLE Deficits / Details: unable  to lift leg off the bed on perfrom a heel slide.  Can push with his leg. ?LLE Sensation: decreased light touch ?LLE Coordination: decreased fine motor;decreased gross motor ?  ?Cervical / Trunk Assessment ?Cervical / Trunk Assessment: Kyphotic ?  ?Communication Communication ?Communication: No difficulties ?  ?Cognition Arousal/Alertness: Awake/alert ?Behavior During Therapy: [facility] for tasks assessed/performed ?Overall Cognitive Status: History of cognitive impairments - at baseline ?  ?  ?  ?  ?  ?  ?  ?  ?  ?  ?  ?  ?  ?  ?  ?  ?  ?  ?  ?General Comments   HR 53 during session ? ?  ?Exercises   ?  ?Shoulder Instructions    ? ? ?Home Living Family/patient expects to be discharged to:: Private residence ?Living Arrangements: Children;Other relatives ?Available Help at Discharge: Family;Available 24 hours/day ?Type of Home: House ?Home Access: Ramped entrance ?  ?  ?Home Layout: Two level;Able to live on main level with bedroom/bathroom ?  ?  ?Bathroom Shower/Tub: Walk-in shower ?  ?Bathroom Toilet: Standard ?Bathroom Accessibility: Yes ?How Accessible: Accessible via walker ?Home Equipment: Wheelchair - power;Shower seat - built in ?  ?Additional Comments: Reports grandson stays with him at night ?  ? ?  ?   ?  ?  ?  ?  ?  ?  ?  ?  ?  ? ?  ?  ?OT Problem List: Decreased strength;Decreased range of motion;Decreased activity tolerance;Impaired balance (sitting and/or standing);Impaired UE functional use;Impaired tone ?  ?   ?OT Treatment/Interventions: Self-care/ADL training;Therapeutic exercise;Therapeutic activities;Patient/family education;Balance training  ?  ?OT Goals(Current goals can be found in the care plan section) Acute Rehab OT Goals ?Patient Stated Goal: None stated ?OT Goal Formulation: With patient/family ?Time For Goal Achievement: 06/11/21 ?Potential to Achieve Goals: Good ?ADL Goals ?Pt Will Perform Grooming: with supervision;sitting ?Additional ADL Goal #1: Patient will roll to his left with Min A and  Mod A to the right to increase independence with toileting. ?Additional ADL Goal #2: Patient will perform stand pivot transfer with Max A of 1 to [facility].  ?OT Frequency: Min 2X/week ?  ? ?Co-evaluation   ?  ?  ?  ?  ? ?  ?AM-PAC OT "6 Clicks" Daily Activity     ?Outcome Measure Help from another person eating meals?: A Little ?Help from another person taking care of personal grooming?: A Lot ?Help from another person toileting, which includes using toliet, bedpan, or urinal?: Total ?Help from another person bathing (including washing, rinsing, drying)?: A Lot ?Help from another person to put on and taking off regular upper body clothing?: A Lot ?Help from another person to put on and taking off regular lower body clothing?: Total ?6 Click Score: 11 ?  ?End of Session Equipment Utilized During Treatment: Gait belt ?Nurse Communication: Mobility status ? ?Activity Tolerance: Patient tolerated treatment well ?Patient left: in bed;with call bell/phone within reach;with family/visitor present ? ?OT Visit Diagnosis: Muscle weakness (generalized) (M62.81);Hemiplegia and hemiparesis ?Hemiplegia - Right/Left: Left ?Hemiplegia - dominant/non-dominant: Non-Dominant  ?              ?Time: 9030-0923 ?OT Time Calculation (min): 24 min ?Charges:  OT General Charges ?$OT Visit: 1 Visit ?OT Evaluation ?$OT  Eval Moderate Complexity: 1 Mod ?OT Treatments ?$Self Care/Home Management : 8-22 mins ? ?05/28/2021 ? ?RP, OTR/L ? ?Acute Rehabilitation Services ? ?Office:  [phone] ? ? ?Emerald Shor D Shakaya Bhullar ?05/28/2021, 4:59 PM ?

## 2021-05-28 NOTE — Progress Notes (Signed)
?PROGRESS NOTE ? ? ? ?Fred [name]  EXN:[id number] DOB: [birth year] DOA: 05/27/2021 ?[facility]  ? ? ?Brief Narrative:  ?86 year old male with history of type 2 diabetes on insulin, history of stroke with resultant left-sided hemiparesis, hypertension, history of CABG, history of seizure disorder, wheelchair-bound, hyperlipidemia, chronic diastolic heart failure presents to the ER today with continued fatigue. ?  ?Patient was recently admitted to [facility] from May 20, 2021 through May 23, 2021.  He was admitted there for asthma/bronchitis.  Patient's daughter Butch Penny denies the patient having a history of asthma.  He also had some mild lactic acidosis.  This was treated with steroids.  He had some agitation at night and was ultimately discharged. ?  ?On the night of discharge, daughter states the patient had fallen on the ground.  He was found the next morning by his family.  Patient lives in a house next door to his son.  His grandsons take turns staying with him at night.  His grandsons are older teenagers and young 36 year olds. ?  ?Since his fall, the patient's, continue to complain of fatigue.  Patient is nonambulatory.  He is wheelchair-bound.  He has not had any specific chest pain but does continue to complain of fatigue.  Patient daughter Butch Penny states the patient never complains of wanted, the hospital.  So she found extremely concerning today when the patient asked to be sent to the hospital for evaluation. ?  ?Appetite has been somewhat poor this week but no nausea, vomiting or diarrhea. ?  ?Patient is no longer taking Eliquis.  He was on Eliquis in the past.  Was finally determined that his A-fib was postoperative from his CABG in 2005 and Eliquis was stopped.  He remains on Plavix for his history of peripheral vascular disease and prior stroke. ?  ?On admission the ER, temp 97.7 heart rate 60 blood pressure 143/62 satting 97% on room air. ?  ?Labs sodium 137,  bicarb 18, BUN of 43, creatinine 1.43, magnesium 2.6 ?  ?White count 11.6, hemoglobin 14.9, platelets 273 ?  ?EKG was sinus bradycardia, T wave inversions in lead III and aVF ?  ?Initial troponin I of 207, repeat troponin of 173 ?  ?[facility] contacted for admission.  ?  ? ? ? ?Consultants:  ?cardiology ? ?Procedures:  ? ?Antimicrobials:  ?  ? ? ?Subjective: ?Denies sob or cp while in bed. No new complaints ? ?Objective: ?Vitals:  ? 05/27/21 2200 05/28/21 0011 05/28/21 0021 05/28/21 4967  ?BP: (!) 117/53  (!) 145/69 (!) 150/57  ?Pulse: (!) 53  65 (!) 56  ?Resp: '13  17 16  '$ ?Temp:   98 ?F (36.7 ?C) 97.9 ?F (36.6 ?C)  ?TempSrc:   Oral Oral  ?SpO2: 96%  100% 99%  ?Weight:  80.2 kg    ?Height:  '5\' 3"'$  (1.6 m)    ? ? ?Intake/Output Summary (Last 24 hours) at 05/28/2021 0838 ?Last data filed at 05/28/2021 5916 ?Gross per 24 hour  ?Intake 397.46 ml  ?Output 500 ml  ?Net -102.54 ml  ? ?Filed Weights  ? 05/28/21 0011  ?Weight: 80.2 kg  ? ? ?Examination: ?Calm, NAD ?Cta no w/r ?Reg s1/s2 no gallop ?Soft benign +bs ?No edema ?Aaoxox3  ?Mood and affect appropriate in current setting  ? ? ?Data Reviewed: I have personally reviewed following labs and imaging studies ? ?CBC: ?Recent Labs  ?Lab 05/27/21 ?2006 05/28/21 ?0330  ?WBC 11.6* 13.9*  ?NEUTROABS 10.1* 11.2*  ?HGB 14.9  14.3  ?HCT 45.3 42.4  ?MCV 96.2 92.8  ?PLT 273 242  ? ?Basic Metabolic Panel: ?Recent Labs  ?Lab 05/27/21 ?2006 05/28/21 ?0330  ?NA 137 138  ?K 4.8 4.5  ?CL 102 100  ?CO2 18* 21*  ?GLUCOSE 213* 193*  ?BUN 43* 44*  ?CREATININE 1.43* 1.42*  ?CALCIUM 8.7* 8.4*  ?MG 2.6* 2.5*  ? ?GFR: ?Estimated Creatinine Clearance: 36.3 mL/min (A) (by C-G formula based on SCr of 1.42 mg/dL (H)). ?Liver Function Tests: ?Recent Labs  ?Lab 05/28/21 ?0330  ?AST 18  ?ALT 21  ?ALKPHOS 39  ?BILITOT 1.2  ?PROT 5.6*  ?ALBUMIN 2.4*  ? ?No results for input(s): LIPASE, AMYLASE in the last 168 hours. ?No results for input(s): AMMONIA in the last 168 hours. ?Coagulation Profile: ?No  results for input(s): INR, PROTIME in the last 168 hours. ?Cardiac Enzymes: ?Recent Labs  ?Lab 05/28/21 ?0330  ?CKTOTAL 60  ? ?BNP (last 3 results) ?No results for input(s): PROBNP in the last 8760 hours. ?HbA1C: ?Recent Labs  ?  05/28/21 ?0330  ?HGBA1C 7.5*  ? ?CBG: ?Recent Labs  ?Lab 05/28/21 ?0053 05/28/21 ?0618  ?GLUCAP 219* 165*  ? ?Lipid Profile: ?Recent Labs  ?  05/28/21 ?0330  ?CHOL 152  ?HDL 35*  ?[place] 84  ?TRIG 163*  ?CHOLHDL 4.3  ? ?Thyroid Function Tests: ?No results for input(s): TSH, T4TOTAL, FREET4, T3FREE, THYROIDAB in the last 72 hours. ?Anemia Panel: ?No results for input(s): VITAMINB12, FOLATE, FERRITIN, TIBC, IRON, RETICCTPCT in the last 72 hours. ?Sepsis Labs: ?No results for input(s): PROCALCITON, LATICACIDVEN in the last 168 hours. ? ?No results found for this or any previous visit (from the past 240 hour(s)).  ? ? ? ? ? ?Radiology Studies: ?DG Chest 2 View ? ?Result Date: 05/27/2021 ?CLINICAL DATA:  Dyspnea. EXAM: CHEST - 2 VIEW COMPARISON:  None. FINDINGS: The lateral views limited in evaluation secondary to positioning of the patient's upper extremity. Multiple sternal wires and vascular clips are seen. Low lung volumes are noted. Very mild atelectasis is noted within the bilateral lung bases. There is no evidence of a pleural effusion or pneumothorax. The heart size and mediastinal contours are within normal limits. There is marked severity calcification of the aortic arch. Degenerative changes seen throughout the thoracic spine. IMPRESSION: 1. Evidence of prior median sternotomy/CABG. 2. Very mild bibasilar atelectasis. Electronically Signed   By: Virgina Norfolk M.D.   On: 05/27/2021 20:19   ? ? ? ? ? ?Scheduled Meds: ? allopurinol  300 mg Oral Daily  ? aspirin EC  81 mg Oral Daily  ? atorvastatin  80 mg Oral Daily  ? clopidogrel  75 mg Oral Daily  ? divalproex  500 mg Oral QHS  ? doxepin  10 mg Oral QHS  ? escitalopram  10 mg Oral Daily  ? ezetimibe  10 mg Oral Daily  ? insulin  aspart  0-15 Units Subcutaneous TID WC  ? insulin aspart  0-5 Units Subcutaneous QHS  ? insulin glargine  20 Units Subcutaneous QHS  ? metoprolol tartrate  50 mg Oral BID  ? niacin  500 mg Oral QHS  ? tamsulosin  0.4 mg Oral QPC supper  ? ?Continuous Infusions: ? lactated ringers 50 mL/hr at 05/28/21 0114  ? ? ?Assessment & Plan: ?  ?Principal Problem: ?  Elevated troponin ?Active Problems: ?  AKI (acute kidney injury) ([place]) ?  Hyperlipidemia ?  Hypertension associated with diabetes ([place]) ?  S/P CABG (coronary artery bypass graft) ?  Type  2 diabetes mellitus with complication, with long-term current use of insulin ([place]) ?  Hemiparesis affecting left side as late effect of cerebrovascular accident [facility]) ?  Chronic diastolic (congestive) heart failure (HCC) ?  Seizure disorder ([place]) ?  DNR (do not resuscitate)/DNI(Do Not intubate) ? ?Elevated troponin ?Cardiology consulted ?Echo pending ? ? ?AKI (acute kidney injury) ([place]) ?Unsure if has underlying CKD.  ?Hold ARB ?Avoid nephrotoxic meds ?Start LR at 64m/hr ?  ? ?Seizure disorder ([place] ?Chronic ?Continue Depakote ? ? ?S/P CABG (coronary artery bypass graft) ?Continue statins, Zetia Plavix aspirin ? ?  ?Chronic diastolic (congestive) heart failure (HCC) ?Euvolemic monitor on IV fluids ?  ?Hemiparesis affecting left side as late effect of cerebrovascular accident ([facility] ?Chronic. Pt has paresis of both left UE and LE. Pt is wheelchair bound. ?Continue aspirin and Plavix ?  ?Type 2 diabetes mellitus with complication, with long-term current use of insulin ([place] ?Hold jardiance given AKI. Continue with SSI and lantus. ?  ? ?  ?Hypertension associated with diabetes ([place] ?Pt is bradycardic with HR in the 50s. May need to hold/reduce lopressor dose. ?  ?Hyperlipidemia ?Check lipid panel ? ? ?Consult PT ? ?DVT prophylaxis: Lovenox ?Code Status: DNR ?Family Communication: None at bedside ?Disposition Plan:  ?Status is: Observation ?The patient will require care spanning > 2  midnights and should be moved to inpatient because: w/u pending. Needs OT ?  ? ? ? ? ? LOS: 0 days  ? ?Time spent: 35 min ? ? ? ?SNolberto Hanlon MD ?[facility] ?Pager [phone] ? ?If 7PM-7AM, please contac

## 2021-05-28 NOTE — Plan of Care (Signed)

## 2021-05-28 NOTE — Consult Note (Signed)
?Cardiology Consultation:  ? ?Patient ID: Fred [name] ?MRN: [id number]; DOB: [birth year] ? ?Admit date: 05/27/2021 ?Date of Consult: 05/28/2021 ? ?PCP:  [facility] ?  ?[place] [facility] Providers ?Cardiologist:  None      ? ? ?Patient Profile:  ? ?Fred [name] is a 86 y.o. male with a hx of CAD s/p CABG, PAF, CVA, CHF, DM, HLD, HTN, PAD who is being seen 05/28/2021 for the evaluation of abnormal troponin at the request of Dr Bridgett Larsson. ? ?History of Present Illness:  ? ?Fred [name] is an 86 year old male with history of type 2 diabetes on insulin, history of stroke with resultant left-sided hemiparesis, hypertension, history of CABG, history of seizure disorder, wheelchair-bound, hyperlipidemia, chronic diastolic heart failure presents to the ER today with severe fatigue. ?  ?Patient was recently admitted to [facility] from May 20, 2021 through May 23, 2021.  He was admitted there for asthma/bronchitis exacerbation.   ?  ?On the night of discharge, daughter states the patient had fallen on the ground.  He was found the next morning by his family.  Patient lives in a house next door to his son.  His grandsons take turns staying with him at night.  His grandsons are older teenagers and young 53 year olds. ?  ?Since his fall, the patient's, continue to complain of fatigue.  Patient is nonambulatory.  He is wheelchair-bound.  He has not had any specific chest pain but does continue to complain of fatigue.  Patient daughter Butch Penny states the patient never complains, so she found it extremely concerning today when the patient asked to be sent to the hospital for evaluation. ?  ?Appetite has been somewhat poor this week but no nausea, vomiting or diarrhea. ?  ?Patient is no longer taking Eliquis.  He was on Eliquis in the past.  Was finally determined that his A-fib was postoperative from his CABG in 2005 and Eliquis was stopped.  He remains on Plavix for his history of peripheral vascular  disease and prior stroke. ? ?He sees a medical provider at the [place], but I do not see that he has been following w/ cardiology anywhere on a regular basis. ? ?When I asked the patient why he came to the hospital, he tells me he is not sure. He does mention that he recently has had some "breathing problems". Nursing staff states pt's daughter was concerned for possible UTI and mild AMS. ? ? ?Past Medical History:  ?Diagnosis Date  ? Acute CVA (cerebrovascular accident) ([place]) 11/13/2020  ? Atrial fibrillation ([place])   ? Post operative  ? CAD (coronary artery disease)   ? s/p cabg  ? CHF (congestive heart failure) ([place])   ? Diverticulosis   ? DJD (degenerative joint disease)   ? DM (diabetes mellitus) ([place])   ? History of anemia   ? History of stroke   ? Hyperlipidemia   ? Hypertension   ? 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 Peripheral vascular disease ([place])   ? Polyneuropathy, diabetic ([place])   ? Seizure disorder, complex partial ([place])   ? Stroke (cerebrum) ([place]) 03/21/2017  ? Syncope and collapse   ? ? ?Past Surgical History:  ?Procedure Laterality Date  ? CATARACT EXTRACTION W/ INTRAOCULAR LENS  IMPLANT, BILATERAL  06/2013  ? Dr. [name]  ? CORONARY ARTERY BYPASS GRAFT  02/13/2004  ? 4 vesel  ? removal of sebaceous cyst from neck  08/2007  ? Dr. [name]  ? TONSILLECTOMY    ?  ? ?  Home Medications:  ?Prior to Admission medications   ?Medication Sig Start Date End Date Taking? Authorizing Provider  ?acetaminophen (TYLENOL) 325 MG tablet Take 2 tablets (650 mg total) by mouth every 4 (four) hours as needed for mild pain (or temp > 37.5 C (99.5 F)). 09/13/17  Yes Angiulli, Lavon Paganini, PA-C  ?albuterol (VENTOLIN HFA) 108 (90 Base) MCG/ACT inhaler Inhale 1-2 puffs into the lungs every 6 (six) hours as needed for wheezing. 05/23/21  Yes [provider]  ?allopurinol (ZYLOPRIM) 300 MG tablet Take 1 tablet (300 mg total) by mouth daily. 07/27/20  Yes [name], MD  ?amoxicillin-clavulanate  (AUGMENTIN) 875-125 MG tablet Take 1 tablet by mouth See admin instructions. Bid x 7 days 05/23/21  Yes [provider]  ?ASPIRIN EC 81 MG EC tablet Take 81 mg by mouth daily. 07/30/20  Yes [provider]  ?atorvastatin (LIPITOR) 80 MG tablet Take 1 tablet (80 mg total) by mouth daily. 07/27/20  Yes [name], MD  ?blood glucose meter kit and supplies Check blood sugar 3 times daily. 04/12/18  Yes [name], MD  ?Cholecalciferol (VITAMIN D) 50 MCG (2000 UT) tablet Take 2,000 Units by mouth daily. 04/19/19  Yes [provider]  ?clopidogrel (PLAVIX) 75 MG tablet Take 1 tablet (75 mg total) by mouth daily. 12/02/20  Yes [place] Donnamarie Poag, DO  ?Coenzyme Q10 (COQ10) 100 MG CAPS Take 300 mg by mouth at bedtime.   Yes [provider]  ?divalproex (DEPAKOTE ER) 500 MG 24 hr tablet TAKE 1 TABLET(500 MG) BY MOUTH DAILY ?Patient taking differently: Take 500 mg by mouth at bedtime. 05/25/20  Yes Cameron Sprang, MD  ?doxepin (SINEQUAN) 10 MG capsule Take 1 capsule (10 mg total) by mouth at bedtime. 12/01/20  Yes [place] Donnamarie Poag, DO  ?escitalopram (LEXAPRO) 10 MG tablet Take 10 mg by mouth daily. 10/28/20  Yes [provider]  ?ezetimibe (ZETIA) 10 MG tablet Take 1 tablet (10 mg total) by mouth daily. 07/27/20  Yes [name], MD  ?guaiFENesin (MUCINEX PO) Take 600 mg by mouth 2 (two) times daily as needed (cough).   Yes [provider]  ?insulin glargine, 1 Unit Dial, (TOUJEO SOLOSTAR) 300 UNIT/ML Solostar Pen Inject 22 Units into the skin at bedtime. 07/27/20  Yes [name], MD  ?JARDIANCE 10 MG TABS tablet TAKE 1 TABLET(10 MG) BY MOUTH DAILY BEFORE BREAKFAST ?Patient taking differently: Take 25 mg by mouth daily. 10/23/20  Yes [name], MD  ?Lancets (ONETOUCH DELICA PLUS [id number]) [place]  04/12/18  Yes [provider]  ?linagliptin (TRADJENTA) 5 MG TABS tablet Take 1 tablet (5 mg total) by mouth daily. 07/27/20  Yes  [name], MD  ?losartan (COZAAR) 50 MG tablet Take by mouth. 01/04/21  Yes [provider]  ?Magnesium Oxide 420 MG TABS Take 420 mg by mouth daily. 04/19/19  Yes [provider]  ?metFORMIN (GLUCOPHAGE-XR) 500 MG 24 hr tablet Take 500 mg by mouth daily. 01/05/21  Yes [provider]  ?metoprolol tartrate (LOPRESSOR) 50 MG tablet Take 50 mg by mouth 2 (two) times daily. 01/04/21  Yes [provider]  ?niacin (NIASPAN) 500 MG CR tablet TAKE 1 TABLET(500 MG) BY MOUTH AT BEDTIME ?Patient taking differently: Take 500 mg by mouth at bedtime. 12/02/19  Yes [name], MD  ?NOVOFINE PEN NEEDLE 32G X 6 MM MISC INJECT INSULIN DAILY 02/07/20  Yes [name], MD  ?ONE TOUCH ULTRA TEST  test strip  04/12/18  Yes [provider]  ?senna-docusate (SENOKOT-S) 8.6-50 MG tablet Take 1 tablet by mouth 2 (two) times daily. ?Patient taking differently: Take 1 tablet by mouth as needed for mild constipation. 09/13/17  Yes Angiulli, Lavon Paganini, PA-C  ?tamsulosin (FLOMAX) 0.4 MG CAPS capsule Take 1 capsule (0.4 mg total) by mouth daily after supper. 12/01/20  Yes [place] Donnamarie Poag, DO  ?losartan (COZAAR) 25 MG tablet Take 1 tablet (25 mg total) by mouth daily. ?Patient not taking: Reported on 05/27/2021 07/27/20   [name], MD  ?metoprolol succinate (TOPROL-XL) 25 MG 24 hr tablet Take 0.5 tablets (12.5 mg total) by mouth daily. ?Patient not taking: Reported on 05/27/2021 12/02/20   [place] Donnamarie Poag, DO  ?predniSONE (DELTASONE) 50 MG tablet Take 50 mg by mouth See admin instructions. Qd x 7 days ?Patient not taking: Reported on 05/27/2021 05/23/21   [provider]  ? ? ?Inpatient Medications: ?Scheduled Meds: ? allopurinol  300 mg Oral Daily  ? aspirin EC  81 mg Oral Daily  ? atorvastatin  80 mg Oral Daily  ? clopidogrel  75 mg Oral Daily  ? divalproex  500 mg Oral QHS  ? doxepin  10 mg Oral QHS  ? escitalopram  10 mg Oral Daily  ? ezetimibe  10 mg Oral  Daily  ? insulin aspart  0-15 Units Subcutaneous TID WC  ? insulin aspart  0-5 Units Subcutaneous QHS  ? insulin glargine  20 Units Subcutaneous QHS  ? metoprolol tartrate  50 mg Oral BID  ? niacin  500 mg Oral QHS

## 2021-05-29 DIAGNOSIS — R778 Other specified abnormalities of plasma proteins: Secondary | ICD-10-CM | POA: Diagnosis not present

## 2021-05-29 LAB — BASIC METABOLIC PANEL
Anion gap: 10 (ref 5–15)
BUN: 31 mg/dL — ABNORMAL HIGH (ref 8–23)
CO2: 26 mmol/L (ref 22–32)
Calcium: 8.3 mg/dL — ABNORMAL LOW (ref 8.9–10.3)
Chloride: 103 mmol/L (ref 98–111)
Creatinine, Ser: 1.16 mg/dL (ref 0.61–1.24)
GFR, Estimated: >60 mL/min (ref >60)
Glucose, Bld: 118 mg/dL — ABNORMAL HIGH (ref 70–99)
Potassium: 3.4 mmol/L — ABNORMAL LOW (ref 3.5–5.1)
Sodium: 139 mmol/L (ref 135–145)

## 2021-05-29 LAB — CBC
HCT: 38.2 % — ABNORMAL LOW (ref 39.0–52.0)
Hemoglobin: 13.1 g/dL (ref 13.0–17.0)
MCH: 31.9 pg (ref 26.0–34.0)
MCHC: 34.3 g/dL (ref 30.0–36.0)
MCV: 92.9 fL (ref 80.0–100.0)
Platelets: 191 10*3/uL (ref 150–400)
RBC: 4.11 MIL/uL — ABNORMAL LOW (ref 4.22–5.81)
RDW: 14.9 % (ref 11.5–15.5)
WBC: 8.4 10*3/uL (ref 4.0–10.5)
nRBC: 0 % (ref 0.0–0.2)

## 2021-05-29 LAB — GLUCOSE, CAPILLARY
Glucose-Capillary: 123 mg/dL — ABNORMAL HIGH (ref 70–99)
Glucose-Capillary: 171 mg/dL — ABNORMAL HIGH (ref 70–99)
Glucose-Capillary: 195 mg/dL — ABNORMAL HIGH (ref 70–99)

## 2021-05-29 NOTE — Plan of Care (Signed)
?  Problem: Clinical Measurements: ?Goal: Diagnostic test results will improve ?Outcome: Progressing ?Goal: Respiratory complications will improve ?Outcome: Progressing ?Goal: Cardiovascular complication will be avoided ?Outcome: Progressing ?  ?Problem: Nutrition: ?Goal: Adequate nutrition will be maintained ?Outcome: Progressing ?  ?Problem: Coping: ?Goal: Level of anxiety will decrease ?Outcome: Progressing ?  ?Problem: Elimination: ?Goal: Will not experience complications related to urinary retention ?Outcome: Progressing ?  ?Problem: Pain Managment: ?Goal: General experience of comfort will improve ?Outcome: Progressing ?  ?

## 2021-05-29 NOTE — Progress Notes (Signed)
?PROGRESS NOTE ? ? ? ?Fred [name]  YIR:[id number] DOB: October 03, 1935 DOA: 05/27/2021 ?[facility]  ? ? ?Brief Narrative:  ?86 year old male with history of type 2 diabetes on insulin, history of stroke with resultant left-sided hemiparesis, hypertension, history of CABG, history of seizure disorder, wheelchair-bound, hyperlipidemia, chronic diastolic heart failure presents to the ER today with continued fatigue. ?  ?Patient was recently admitted to [facility] from May 20, 2021 through May 23, 2021.  He was admitted there for asthma/bronchitis.  Patient's daughter Butch Penny denies the patient having a history of asthma.  He also had some mild lactic acidosis.  This was treated with steroids.  He had some agitation at night and was ultimately discharged. ?  ?On the night of discharge, daughter states the patient had fallen on the ground.  He was found the next morning by his family.  Patient lives in a house next door to his son.  His grandsons take turns staying with him at night.  His grandsons are older teenagers and young 49 year olds. ?  ?Since his fall, the patient's, continue to complain of fatigue.  Patient is nonambulatory.  He is wheelchair-bound.  He has not had any specific chest pain but does continue to complain of fatigue.  Patient daughter Butch Penny states the patient never complains of wanted, the hospital.  So she found extremely concerning today when the patient asked to be sent to the hospital for evaluation. ?  ?Appetite has been somewhat poor this week but no nausea, vomiting or diarrhea. ?  ?Patient is no longer taking Eliquis.  He was on Eliquis in the past.  Was finally determined that his A-fib was postoperative from his CABG in 2005 and Eliquis was stopped.  He remains on Plavix for his history of peripheral vascular disease and prior stroke. ?  ?On admission the ER, temp 97.7 heart rate 60 blood pressure 143/62 satting 97% on room air. ?  ?Labs sodium 137,  bicarb 18, BUN of 43, creatinine 1.43, magnesium 2.6 ?  ?White count 11.6, hemoglobin 14.9, platelets 273 ?  ?EKG was sinus bradycardia, T wave inversions in lead III and aVF ?  ?Initial troponin I of 207, repeat troponin of 173 ?  ?[facility] hospitalist contacted for admission.  ?  ? ?3/18 no overnight issues. OT rec. SNF ? ?Consultants:  ?cardiology ? ?Procedures:  ? ?Antimicrobials:  ?  ? ? ?Subjective: ?Denies sob, cp, abd pain, grandson at bedside brought him breakfast. Good po intake ? ?Objective: ?Vitals:  ? 05/28/21 1128 05/28/21 1609 05/28/21 2030 05/29/21 0428  ?BP: (!) 131/55 (!) 118/55 (!) 125/55 (!) 110/48  ?Pulse: (!) 53 (!) 53  (!) 54  ?Resp: '15 14 14 14  '$ ?Temp: 98.4 ?F (36.9 ?C) 97.9 ?F (36.6 ?C) 98 ?F (36.7 ?C) 97.8 ?F (36.6 ?C)  ?TempSrc: Oral Oral Oral Oral  ?SpO2: 97% 97% 97% 91%  ?Weight:    82.1 kg  ?Height:      ? ? ?Intake/Output Summary (Last 24 hours) at 05/29/2021 0806 ?Last data filed at 05/29/2021 0426 ?Gross per 24 hour  ?Intake 1494.12 ml  ?Output 850 ml  ?Net 644.12 ml  ? ?Filed Weights  ? 05/28/21 0011 05/29/21 0428  ?Weight: 80.2 kg 82.1 kg  ? ? ?Examination: ?Calm, NAD ?Cta no w/r ?Reg s1/s2 no gallop ?Soft benign +bs ?No edema ?Aaoxox3  ?Mood and affect appropriate in current setting  ? ? ?Data Reviewed: I have personally reviewed following labs and imaging studies ? ?CBC: ?  Recent Labs  ?Lab 05/27/21 ?2006 05/28/21 ?0330  ?WBC 11.6* 13.9*  ?NEUTROABS 10.1* 11.2*  ?HGB 14.9 14.3  ?HCT 45.3 42.4  ?MCV 96.2 92.8  ?PLT 273 242  ? ?Basic Metabolic Panel: ?Recent Labs  ?Lab 05/27/21 ?2006 05/28/21 ?0330  ?NA 137 138  ?K 4.8 4.5  ?CL 102 100  ?CO2 18* 21*  ?GLUCOSE 213* 193*  ?BUN 43* 44*  ?CREATININE 1.43* 1.42*  ?CALCIUM 8.7* 8.4*  ?MG 2.6* 2.5*  ? ?GFR: ?Estimated Creatinine Clearance: 36.7 mL/min (A) (by C-G formula based on SCr of 1.42 mg/dL (H)). ?Liver Function Tests: ?Recent Labs  ?Lab 05/28/21 ?0330  ?AST 18  ?ALT 21  ?ALKPHOS 39  ?BILITOT 1.2  ?PROT 5.6*  ?ALBUMIN 2.4*  ? ?No results  for input(s): LIPASE, AMYLASE in the last 168 hours. ?No results for input(s): AMMONIA in the last 168 hours. ?Coagulation Profile: ?No results for input(s): INR, PROTIME in the last 168 hours. ?Cardiac Enzymes: ?Recent Labs  ?Lab 05/28/21 ?0330  ?CKTOTAL 60  ? ?BNP (last 3 results) ?No results for input(s): PROBNP in the last 8760 hours. ?HbA1C: ?Recent Labs  ?  05/28/21 ?0330  ?HGBA1C 7.5*  ? ?CBG: ?Recent Labs  ?Lab 05/28/21 ?0618 05/28/21 ?1144 05/28/21 ?1607 05/28/21 ?2120 05/29/21 ?0600  ?GLUCAP 165* 139* 109* 186* 123*  ? ?Lipid Profile: ?Recent Labs  ?  05/28/21 ?0330  ?CHOL 152  ?HDL 35*  ?[place] 84  ?TRIG 163*  ?CHOLHDL 4.3  ? ?Thyroid Function Tests: ?No results for input(s): TSH, T4TOTAL, FREET4, T3FREE, THYROIDAB in the last 72 hours. ?Anemia Panel: ?No results for input(s): VITAMINB12, FOLATE, FERRITIN, TIBC, IRON, RETICCTPCT in the last 72 hours. ?Sepsis Labs: ?No results for input(s): PROCALCITON, LATICACIDVEN in the last 168 hours. ? ?No results found for this or any previous visit (from the past 240 hour(s)).  ? ? ? ? ? ?Radiology Studies: ?DG Chest 2 View ? ?Result Date: 05/27/2021 ?CLINICAL DATA:  Dyspnea. EXAM: CHEST - 2 VIEW COMPARISON:  None. FINDINGS: The lateral views limited in evaluation secondary to positioning of the patient's upper extremity. Multiple sternal wires and vascular clips are seen. Low lung volumes are noted. Very mild atelectasis is noted within the bilateral lung bases. There is no evidence of a pleural effusion or pneumothorax. The heart size and mediastinal contours are within normal limits. There is marked severity calcification of the aortic arch. Degenerative changes seen throughout the thoracic spine. IMPRESSION: 1. Evidence of prior median sternotomy/CABG. 2. Very mild bibasilar atelectasis. Electronically Signed   By: Virgina Norfolk M.D.   On: 05/27/2021 20:19  ? ?ECHOCARDIOGRAM COMPLETE ? ?Result Date: 05/28/2021 ?   ECHOCARDIOGRAM REPORT   Patient Name:    Fred [name] Date of Exam: 05/28/2021 Medical Rec #:  [id number]        Height:       63.0 in Accession #:    [id number]       Weight:       176.8 lb Date of Birth:  12/02/1935        BSA:          1.835 m? Patient Age:    82 years         BP:           150/57 mmHg Patient Gender: M                HR:           52 bpm. Exam Location:  Inpatient  Procedure: 2D Echo Indications:    CHF  History:        Patient has prior history of Echocardiogram examinations, most                 recent 11/14/2020. CAD; Risk Factors:Hypertension and Diabetes.  Sonographer:    Jefferey Pica Referring Phys: [place]  1. Left ventricular ejection fraction, by estimation, is 55 to 60%. The left ventricle has normal function. The left ventricle has no regional wall motion abnormalities. Left ventricular diastolic parameters are consistent with Grade II diastolic dysfunction (pseudonormalization).  2. Peak RV-RA gradient 21 mmHg. IVC not well-visualized. Right ventricular systolic function is normal. The right ventricular size is mildly enlarged.  3. Left atrial size was mildly dilated.  4. The mitral valve is normal in structure. Mild mitral valve regurgitation. No evidence of mitral stenosis. Moderate mitral annular calcification.  5. The aortic valve is tricuspid. There is moderate calcification of the aortic valve. Aortic valve regurgitation is trivial. FINDINGS  Left Ventricle: Left ventricular ejection fraction, by estimation, is 55 to 60%. The left ventricle has normal function. The left ventricle has no regional wall motion abnormalities. The left ventricular internal cavity size was normal in size. There is  no left ventricular hypertrophy. Left ventricular diastolic parameters are consistent with Grade II diastolic dysfunction (pseudonormalization). Right Ventricle: Peak RV-RA gradient 21 mmHg. IVC not well-visualized. The right ventricular size is mildly enlarged. No increase in right ventricular wall  thickness. Right ventricular systolic function is normal. Left Atrium: Left atrial size was mildly dilated. Right Atrium: Right atrial size was normal in size. Pericardium: There is no evidence of pericardial effusion

## 2021-05-30 DIAGNOSIS — R778 Other specified abnormalities of plasma proteins: Secondary | ICD-10-CM | POA: Diagnosis not present

## 2021-05-30 LAB — GLUCOSE, CAPILLARY
Glucose-Capillary: 139 mg/dL — ABNORMAL HIGH (ref 70–99)
Glucose-Capillary: 189 mg/dL — ABNORMAL HIGH (ref 70–99)
Glucose-Capillary: 304 mg/dL — ABNORMAL HIGH (ref 70–99)

## 2021-05-30 MED ORDER — POLYETHYLENE GLYCOL 3350 17 G PO PACK
17.0000 g | PACK | Freq: Two times a day (BID) | ORAL | Status: DC
  Administered 2021-05-30 – 2021-06-10 (×23): 17 g via ORAL
  Filled 2021-05-30 (×23): qty 1

## 2021-05-30 MED ORDER — SENNOSIDES-DOCUSATE SODIUM 8.6-50 MG PO TABS
1.0000 | ORAL_TABLET | Freq: Every day | ORAL | Status: DC
  Administered 2021-05-30 – 2021-06-10 (×12): 1 via ORAL
  Filled 2021-05-30 (×12): qty 1

## 2021-05-30 NOTE — NC FL2 (Signed)
?Reading MEDICAID FL2 LEVEL OF CARE SCREENING TOOL  ?  ? ?IDENTIFICATION  ?Patient Name: ?Fred [name] Birthdate: 01/16/1936 Sex: male Admission Date (Current Location): ?05/27/2021  ?[place] and [place] Number: ? [place] ?  Facility and Address:  ?[facility], [place] [address] [place], [place] [address] ?     Provider Number: ?[id number]  ?Attending Physician Name and Address:  ?Nolberto Hanlon, MD ? Relative Name and Phone Number:  ?Debbie [phone] ?   ?Current Level of Care: ?Hospital Recommended Level of Care: ?[place] Prior Approval Number: ?  ? ?Date Approved/Denied: ?  PASRR Number: ?[id number] A ? ?Discharge Plan: ?SNF ?  ? ?Current Diagnoses: ?Patient Active Problem List  ? Diagnosis Date Noted  ? Elevated troponin 05/27/2021  ? DNR (do not resuscitate)/DNI(Do Not intubate) 05/27/2021  ? AKI (acute kidney injury) ([place]) 11/13/2020  ? Hyperlipidemia associated with type 2 diabetes mellitus ([place]) 11/13/2020  ? Seizure disorder ([place]) 11/13/2020  ? Depression with anxiety 11/13/2020  ? Urinary incontinence with continuous leakage 10/11/2019  ? Oropharyngeal dysphagia 08/02/2019  ? Fecal urgency 08/02/2019  ? 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 Chronic diastolic (congestive) heart failure (HCC)   ? Hemiparesis affecting left side as late effect of cerebrovascular accident [facility])   ? Small vessel disease ([place]) 08/11/2017  ? Left hemiparesis ([place])   ? History of CVA (cerebrovascular accident) 08/10/2017  ? 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 of localized onset, not intractable, without status epilepticus ([place]) 06/20/2017  ? Primary osteoarthritis of both knees 05/11/2017  ? Type 2 diabetes mellitus with complication, with long-term current use of insulin ([place]) 03/27/2015  ? Atherosclerosis of native coronary artery of native heart without angina pectoris 03/27/2015  ? S/P CABG  (coronary artery bypass graft) 11/04/2014  ? History of stroke 09/04/2014  ? Obesity (BMI 30-39.9) 11/29/2013  ? History of gout 05/16/2013  ? CARDIOVASCULAR FUNCTION STUDY, ABNORMAL 01/29/2009  ? ANEMIA / OTHER 05/16/2007  ? Coronary atherosclerosis 05/16/2007  ? Subcortical infarction ([place]) 05/16/2007  ? Hyperlipidemia 03/30/2007  ? DEGENERATIVE JOINT DISEASE 03/30/2007  ? PAROTID LESION, UNSPECIFIED 09/29/2006  ? Partial epilepsy with impairment of consciousness ([place]) 09/29/2006  ? Diabetic polyneuropathy ([place]) 09/29/2006  ? Hypertension associated with diabetes ([place]) 09/29/2006  ? PERIPHERAL VASCULAR DISEASE 09/29/2006  ? DIVERTICULOSIS 09/29/2006  ? ? ?Orientation RESPIRATION BLADDER Height & Weight   ?  ?Self, Time, Situation, Place ? Normal Continent, External catheter Weight: 179 lb 3.7 oz (81.3 kg) ?Height:  '5\' 3"'$  (160 cm)  ?BEHAVIORAL SYMPTOMS/MOOD NEUROLOGICAL BOWEL NUTRITION STATUS  ?  Convulsions/Seizures Continent Diet (See DC summary)  ?AMBULATORY STATUS COMMUNICATION OF NEEDS Skin   ?Limited Assist Verbally Other (Comment) (ecchymosis and abrasion left arm. leg) ?  ?  ?  ?    ?     ?     ? ? ?Personal Care Assistance Level of Assistance  ?Bathing, Feeding, Dressing Bathing Assistance: Limited assistance ?Feeding assistance: Limited assistance ?Dressing Assistance: Maximum assistance ?   ? ?Functional Limitations Info  ?Sight, Hearing, Speech Sight Info: Impaired ?Hearing Info: Impaired ?Speech Info: Adequate  ? ? ?SPECIAL CARE FACTORS FREQUENCY  ?PT (By licensed PT), OT (By licensed OT)   ?  ?PT Frequency: 5x per week ?OT Frequency: 5x per week ?  ?  ?  ?   ? ? ?Contractures Contractures Info: Not present  ? ? ?Additional Factors Info  ?Code Status, Allergies, Insulin  Sliding Scale Code Status Info: DNR ?Allergies Info: Penicllins, Lisinopril ?  ?Insulin Sliding Scale Info: See DC summary ?  ?   ? ?Current Medications (05/30/2021):  This is the current hospital active medication list ?Current  Facility-Administered Medications  ?Medication Dose Route Frequency Provider Last Rate Last Admin  ? acetaminophen (TYLENOL) tablet 650 mg  650 mg Oral Q6H PRN Kristopher Oppenheim, DO      ? Or  ? acetaminophen (TYLENOL) suppository 650 mg  650 mg Rectal Q6H PRN Kristopher Oppenheim, DO      ? allopurinol (ZYLOPRIM) tablet 300 mg  300 mg Oral Daily Kristopher Oppenheim, DO   300 mg at 05/30/21 8676  ? aspirin EC tablet 81 mg  81 mg Oral Daily Kristopher Oppenheim, DO   81 mg at 05/30/21 1950  ? atorvastatin (LIPITOR) tablet 80 mg  80 mg Oral Daily Kristopher Oppenheim, DO   80 mg at 05/30/21 9326  ? clopidogrel (PLAVIX) tablet 75 mg  75 mg Oral Daily Kristopher Oppenheim, DO   75 mg at 05/30/21 7124  ? divalproex (DEPAKOTE ER) 24 hr tablet 500 mg  500 mg Oral QHS Kristopher Oppenheim, DO   500 mg at 05/29/21 2117  ? doxepin (SINEQUAN) capsule 10 mg  10 mg Oral QHS Kristopher Oppenheim, DO   10 mg at 05/29/21 2117  ? enoxaparin (LOVENOX) injection 40 mg  40 mg Subcutaneous Q24H Sloan Leiter B, RPH   40 mg at 05/29/21 1707  ? escitalopram (LEXAPRO) tablet 10 mg  10 mg Oral Daily Kristopher Oppenheim, DO   10 mg at 05/30/21 5809  ? ezetimibe (ZETIA) tablet 10 mg  10 mg Oral Daily Kristopher Oppenheim, DO   10 mg at 05/30/21 9833  ? insulin aspart (novoLOG) injection 0-15 Units  0-15 Units Subcutaneous TID WC Kristopher Oppenheim, DO   3 Units at 05/30/21 1204  ? insulin aspart (novoLOG) injection 0-5 Units  0-5 Units Subcutaneous QHS Kristopher Oppenheim, DO   2 Units at 05/28/21 0110  ? insulin glargine (LANTUS) injection 20 Units  20 Units Subcutaneous QHS Kristopher Oppenheim, DO   20 Units at 05/29/21 2117  ? melatonin tablet 10 mg  10 mg Oral QHS PRN Kristopher Oppenheim, DO      ? metoprolol tartrate (LOPRESSOR) tablet 50 mg  50 mg Oral BID Kristopher Oppenheim, DO   50 mg at 05/30/21 8250  ? niacin (NIASPAN) CR tablet 500 mg  500 mg Oral QHS Kristopher Oppenheim, DO   500 mg at 05/29/21 2117  ? ondansetron (ZOFRAN) tablet 4 mg  4 mg Oral Q6H PRN Kristopher Oppenheim, DO      ? Or  ? ondansetron (ZOFRAN) injection 4 mg  4 mg Intravenous Q6H PRN Kristopher Oppenheim, DO      ?  polyethylene glycol (MIRALAX / GLYCOLAX) packet 17 g  17 g Oral BID Nolberto Hanlon, MD   17 g at 05/30/21 0926  ? senna-docusate (Senokot-S) tablet 1 tablet  1 tablet Oral Daily Nolberto Hanlon, MD   1 tablet at 05/30/21 5397  ? tamsulosin (FLOMAX) capsule 0.4 mg  0.4 mg Oral QPC supper Kristopher Oppenheim, DO   0.4 mg at 05/29/21 1722  ? ? ? ?Discharge Medications: ?Please see discharge summary for a list of discharge medications. ? ?Relevant Imaging Results: ? ?Relevant Lab Results: ? ? ?Additional Information ?SSN# [id number] Pt is vaccinated for covide with one booster ? ?Bary Castilla, LCSW ? ? ? ? ?

## 2021-05-30 NOTE — Progress Notes (Signed)
?PROGRESS NOTE ? ? ? ?Fred [name]  EUM:[id number] DOB: 1935/07/16 DOA: 05/27/2021 ?[facility]  ? ? ?Brief Narrative:  ?86 year old male with history of type 2 diabetes on insulin, history of stroke with resultant left-sided hemiparesis, hypertension, history of CABG, history of seizure disorder, wheelchair-bound, hyperlipidemia, chronic diastolic heart failure presents to the ER today with continued fatigue. ?  ?Patient was recently admitted to [facility] from May 20, 2021 through May 23, 2021.  He was admitted there for asthma/bronchitis.  Patient's daughter Fred [name] denies the patient having a history of asthma.  He also had some mild lactic acidosis.  This was treated with steroids.  He had some agitation at night and was ultimately discharged. ?  ?On the night of discharge, daughter states the patient had fallen on the ground.  He was found the next morning by his family.  Patient lives in a house next door to his son.  His grandsons take turns staying with him at night.  His grandsons are older teenagers and young 56 year olds. ?  ?Since his fall, the patient's, continue to complain of fatigue.  Patient is nonambulatory.  He is wheelchair-bound.  He has not had any specific chest pain but does continue to complain of fatigue.  Patient daughter Fred [name] states the patient never complains of wanted, the hospital.  So she found extremely concerning today when the patient asked to be sent to the hospital for evaluation. ?  ?Appetite has been somewhat poor this week but no nausea, vomiting or diarrhea. ?  ?Patient is no longer taking Eliquis.  He was on Eliquis in the past.  Was finally determined that his A-fib was postoperative from his CABG in 2005 and Eliquis was stopped.  He remains on Plavix for his history of peripheral vascular disease and prior stroke. ?  ?On admission the ER, temp 97.7 heart rate 60 blood pressure 143/62 satting 97% on room air. ?  ?Labs sodium 137,  bicarb 18, BUN of 43, creatinine 1.43, magnesium 2.6 ?  ?White count 11.6, hemoglobin 14.9, platelets 273 ?  ?EKG was sinus bradycardia, T wave inversions in lead III and aVF ?  ?Initial troponin I of 207, repeat troponin of 173 ?  ?[facility] hospitalist contacted for admission.  ?  ? ?3/18 no overnight issues. OT rec. SNF ?3/19 no BM per nsg. Pt without any complaints. Reports eating breaksast. ? ?Consultants:  ?cardiology ? ?Procedures:  ? ?Antimicrobials:  ?  ? ? ?Subjective: ?Patient denies shortness of breath, dizziness or chest pain.  Has no complaints this AM. ? ?Objective: ?Vitals:  ? 05/29/21 2012 05/29/21 2122 05/30/21 0555 05/30/21 0820  ?BP: (!) 93/51 (!) 107/44 (!) 109/43 (!) 120/56  ?Pulse: 63  (!) 59 65  ?Resp: '14  16 15  '$ ?Temp: 97.9 ?F (36.6 ?C)  97.7 ?F (36.5 ?C) 98.1 ?F (36.7 ?C)  ?TempSrc: Oral  Oral Oral  ?SpO2: 95%  95% 97%  ?Weight:   81.3 kg   ?Height:      ? ? ?Intake/Output Summary (Last 24 hours) at 05/30/2021 0844 ?Last data filed at 05/30/2021 0555 ?Gross per 24 hour  ?Intake 1077.36 ml  ?Output 1700 ml  ?Net -622.64 ml  ? ?Filed Weights  ? 05/28/21 0011 05/29/21 0428 05/30/21 0555  ?Weight: 80.2 kg 82.1 kg 81.3 kg  ? ? ?Examination: ?Calm, NAD ?Cta no w/r ?Reg s1/s2 no gallop ?Soft benign +bs ?No edema ?Aaoxox3  ?Mood and affect appropriate in current setting  ? ? ?Data  Reviewed: I have personally reviewed following labs and imaging studies ? ?CBC: ?Recent Labs  ?Lab 05/27/21 ?2006 05/28/21 ?0330 05/29/21 ?0946  ?WBC 11.6* 13.9* 8.4  ?NEUTROABS 10.1* 11.2*  --   ?HGB 14.9 14.3 13.1  ?HCT 45.3 42.4 38.2*  ?MCV 96.2 92.8 92.9  ?PLT 273 242 191  ? ?Basic Metabolic Panel: ?Recent Labs  ?Lab 05/27/21 ?2006 05/28/21 ?0330 05/29/21 ?0946  ?NA 137 138 139  ?K 4.8 4.5 3.4*  ?CL 102 100 103  ?CO2 18* 21* 26  ?GLUCOSE 213* 193* 118*  ?BUN 43* 44* 31*  ?CREATININE 1.43* 1.42* 1.16  ?CALCIUM 8.7* 8.4* 8.3*  ?MG 2.6* 2.5*  --   ? ?GFR: ?Estimated Creatinine Clearance: 44.7 mL/min (by C-G formula based on  SCr of 1.16 mg/dL). ?Liver Function Tests: ?Recent Labs  ?Lab 05/28/21 ?0330  ?AST 18  ?ALT 21  ?ALKPHOS 39  ?BILITOT 1.2  ?PROT 5.6*  ?ALBUMIN 2.4*  ? ?No results for input(s): LIPASE, AMYLASE in the last 168 hours. ?No results for input(s): AMMONIA in the last 168 hours. ?Coagulation Profile: ?No results for input(s): INR, PROTIME in the last 168 hours. ?Cardiac Enzymes: ?Recent Labs  ?Lab 05/28/21 ?0330  ?CKTOTAL 60  ? ?BNP (last 3 results) ?No results for input(s): PROBNP in the last 8760 hours. ?HbA1C: ?Recent Labs  ?  05/28/21 ?0330  ?HGBA1C 7.5*  ? ?CBG: ?Recent Labs  ?Lab 05/28/21 ?2120 05/29/21 ?0600 05/29/21 ?1631 05/29/21 ?2048 05/30/21 ?0552  ?GLUCAP 186* 123* 171* 195* 139*  ? ?Lipid Profile: ?Recent Labs  ?  05/28/21 ?0330  ?CHOL 152  ?HDL 35*  ?[place] 84  ?TRIG 163*  ?CHOLHDL 4.3  ? ?Thyroid Function Tests: ?No results for input(s): TSH, T4TOTAL, FREET4, T3FREE, THYROIDAB in the last 72 hours. ?Anemia Panel: ?No results for input(s): VITAMINB12, FOLATE, FERRITIN, TIBC, IRON, RETICCTPCT in the last 72 hours. ?Sepsis Labs: ?No results for input(s): PROCALCITON, LATICACIDVEN in the last 168 hours. ? ?No results found for this or any previous visit (from the past 240 hour(s)).  ? ? ? ? ? ?Radiology Studies: ?ECHOCARDIOGRAM COMPLETE ? ?Result Date: 05/28/2021 ?   ECHOCARDIOGRAM REPORT   Patient Name:   Fred [name] Date of Exam: 05/28/2021 Medical Rec #:  [id number]        Height:       63.0 in Accession #:    [id number]       Weight:       176.8 lb Date of Birth:  11-03-1935        BSA:          1.835 m? Patient Age:    34 years         BP:           150/57 mmHg Patient Gender: M                HR:           52 bpm. Exam Location:  Inpatient Procedure: 2D Echo Indications:    CHF  History:        Patient has prior history of Echocardiogram examinations, most                 recent 11/14/2020. CAD; Risk Factors:Hypertension and Diabetes.  Sonographer:    Jefferey Pica Referring Phys: [place]  1. Left ventricular ejection fraction, by estimation, is 55 to 60%. The left ventricle has normal function. The left ventricle has no regional wall motion abnormalities. Left  ventricular diastolic parameters are consistent with Grade II diastolic dysfunction (pseudonormalization).  2. Peak RV-RA gradient 21 mmHg. IVC not well-visualized. Right ventricular systolic function is normal. The right ventricular size is mildly enlarged.  3. Left atrial size was mildly dilated.  4. The mitral valve is normal in structure. Mild mitral valve regurgitation. No evidence of mitral stenosis. Moderate mitral annular calcification.  5. The aortic valve is tricuspid. There is moderate calcification of the aortic valve. Aortic valve regurgitation is trivial. FINDINGS  Left Ventricle: Left ventricular ejection fraction, by estimation, is 55 to 60%. The left ventricle has normal function. The left ventricle has no regional wall motion abnormalities. The left ventricular internal cavity size was normal in size. There is  no left ventricular hypertrophy. Left ventricular diastolic parameters are consistent with Grade II diastolic dysfunction (pseudonormalization). Right Ventricle: Peak RV-RA gradient 21 mmHg. IVC not well-visualized. The right ventricular size is mildly enlarged. No increase in right ventricular wall thickness. Right ventricular systolic function is normal. Left Atrium: Left atrial size was mildly dilated. Right Atrium: Right atrial size was normal in size. Pericardium: There is no evidence of pericardial effusion. Mitral Valve: The mitral valve is normal in structure. There is mild calcification of the mitral valve leaflet(s). Moderate mitral annular calcification. Mild mitral valve regurgitation. No evidence of mitral valve stenosis. Tricuspid Valve: The tricuspid valve is normal in structure. Tricuspid valve regurgitation is trivial. Aortic Valve: The aortic valve is tricuspid. There is moderate  calcification of the aortic valve. Aortic valve regurgitation is trivial. Aortic valve peak gradient measures 9.5 mmHg. Pulmonic Valve: The pulmonic valve was normal in structure. Pulmonic valve regurgitation is not visu

## 2021-05-30 NOTE — TOC Initial Note (Signed)
Transition of Care (TOC) - Initial/Assessment Note  ? ? ?Patient Details  ?Name: Fred [name] ?MRN: [id number] ?Date of Birth: May 19, 1935 ? ?Transition of Care (TOC) CM/SW Contact:    ?Bary Castilla, LCSW ?Phone Number:[phone] ?05/30/2021, 1:06 PM ? ?Clinical Narrative:                 ? ?CSW met with patient and  pt's daughter Debbieto discuss PT and recommendation of a SNF. They were  aware of recommendation and in agreement with going to a ST SNF. CSW discussed the SNF process.CSW provided patient with medicare.gov rating list.  Patient gave CSW permission to fax referrals out to local facilities near his home in [place].  Debbie mentioned [facility] being near his home. CSW answered questions about the SNF process and the next steps in the process. ? ?TOC team will continue to assist with discharge planning needs.  ? ?  ?Expected Discharge Plan: [place] ?Barriers to Discharge: Continued Medical Work up, Ship broker, SNF Pending bed offer ? ? ?Patient Goals and CMS Choice ?  ?CMS Medicare.gov Compare Post Acute Care list provided to:: Patient ?Choice offered to / list presented to : Patient ? ?Expected Discharge Plan and Services ?Expected Discharge Plan: [place] ?  ?  ?  ?  ?                ?  ?  ?  ?  ?  ?  ?  ?  ?  ?  ? ?Prior Living Arrangements/Services ?  ?Lives with:: Self, Adult Children ?Patient language and need for interpreter reviewed:: Yes ?       ?  ?Care giver support system in place?: Yes (comment) ?  ?  ? ?Activities of Daily Living ?Home Assistive Devices/Equipment: Wellsite geologist, Wheelchair ?ADL Screening (condition at time of admission) ?Patient's cognitive ability adequate to safely complete daily activities?: Yes ?Is the patient deaf or have difficulty hearing?: Yes ?Does the patient have difficulty seeing, even when wearing glasses/contacts?: No ?Does the patient have difficulty concentrating, remembering, or making decisions?:  No ?Patient able to express need for assistance with ADLs?: Yes ?Does the patient have difficulty dressing or bathing?: Yes ?Independently performs ADLs?: No ?Does the patient have difficulty walking or climbing stairs?: Yes ?Weakness of Legs: Left ?Weakness of Arms/Hands: Left ? ?Permission Sought/Granted ?  ?Permission granted to share information with : Yes, Verbal Permission Granted ? Share Information with NAME: [name] ? Permission granted to share info w AGENCY: SNFs ? Permission granted to share info w Relationship: Daughter ? Permission granted to share info w Contact Information: [phone] ? ?Emotional Assessment ?Appearance:: Appears stated age ?Attitude/Demeanor/Rapport: Engaged ?Affect (typically observed): Accepting ?Orientation: : Oriented to Self, Oriented to Place, Oriented to  Time, Oriented to Situation ?  ?  ? ?Admission diagnosis:  Elevated troponin [R77.8] ?Patient Active Problem List  ? Diagnosis Date Noted  ? Elevated troponin 05/27/2021  ? DNR (do not resuscitate)/DNI(Do Not intubate) 05/27/2021  ? AKI (acute kidney injury) ([place]) 11/13/2020  ? Hyperlipidemia associated with type 2 diabetes mellitus ([place]) 11/13/2020  ? Seizure disorder ([place]) 11/13/2020  ? Depression with anxiety 11/13/2020  ? Urinary incontinence with continuous leakage 10/11/2019  ? Oropharyngeal dysphagia 08/02/2019  ? Fecal urgency 08/02/2019  ? 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 Chronic diastolic (congestive) heart failure (HCC)   ? Hemiparesis affecting left side as late effect of cerebrovascular accident [facility])   ?  Small vessel disease ([place]) 08/11/2017  ? Left hemiparesis ([place])   ? History of CVA (cerebrovascular accident) 08/10/2017  ? 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 of localized onset, not intractable, without status epilepticus ([place]) 06/20/2017  ? Primary osteoarthritis of both knees 05/11/2017  ? Type 2 diabetes mellitus  with complication, with long-term current use of insulin ([place]) 03/27/2015  ? Atherosclerosis of native coronary artery of native heart without angina pectoris 03/27/2015  ? S/P CABG (coronary artery bypass graft) 11/04/2014  ? History of stroke 09/04/2014  ? Obesity (BMI 30-39.9) 11/29/2013  ? History of gout 05/16/2013  ? CARDIOVASCULAR FUNCTION STUDY, ABNORMAL 01/29/2009  ? ANEMIA / OTHER 05/16/2007  ? Coronary atherosclerosis 05/16/2007  ? Subcortical infarction ([place]) 05/16/2007  ? Hyperlipidemia 03/30/2007  ? DEGENERATIVE JOINT DISEASE 03/30/2007  ? PAROTID LESION, UNSPECIFIED 09/29/2006  ? Partial epilepsy with impairment of consciousness ([place]) 09/29/2006  ? Diabetic polyneuropathy ([place]) 09/29/2006  ? Hypertension associated with diabetes ([place]) 09/29/2006  ? PERIPHERAL VASCULAR DISEASE 09/29/2006  ? DIVERTICULOSIS 09/29/2006  ? ?PCP:  Administ[facility], Veterans ?Pharmacy:   ?[facility] ?[address] ?[place] [address] ?Phone: [phone] Fax: [phone] ? ?[facility] ?[phone] ?[place] [address] ?Phone: [phone] Fax: [phone] ? ?[id number] - [place], [place] - [address] ?[address] ?[place] [address] ?Phone: [phone] Fax: [phone] ? ? ? ? ?Social Determinants of Health (SDOH) Interventions ?  ? ?Readmission Risk Interventions ?No flowsheet data found. ? ? ?

## 2021-05-31 DIAGNOSIS — R778 Other specified abnormalities of plasma proteins: Secondary | ICD-10-CM | POA: Diagnosis not present

## 2021-05-31 LAB — GLUCOSE, CAPILLARY
Glucose-Capillary: 157 mg/dL — ABNORMAL HIGH (ref 70–99)
Glucose-Capillary: 129 mg/dL — ABNORMAL HIGH (ref 70–99)
Glucose-Capillary: 200 mg/dL — ABNORMAL HIGH (ref 70–99)
Glucose-Capillary: 191 mg/dL — ABNORMAL HIGH (ref 70–99)

## 2021-05-31 LAB — POTASSIUM: Potassium: 3.1 mmol/L — ABNORMAL LOW (ref 3.5–5.1)

## 2021-05-31 MED ORDER — POTASSIUM CHLORIDE CRYS ER 20 MEQ PO TBCR
40.0000 meq | EXTENDED_RELEASE_TABLET | Freq: Two times a day (BID) | ORAL | Status: AC
  Administered 2021-05-31: 40 meq via ORAL
  Filled 2021-05-31 (×2): qty 2

## 2021-05-31 NOTE — Progress Notes (Signed)
?PROGRESS NOTE ? ? ? ?Fred [name]  HBZ:[id number] DOB: 15-Apr-1935 DOA: 05/27/2021 ?PCP: [facility], [facility]  ? ? ?Brief Narrative:  ?86 year old male with history of type 2 diabetes on insulin, history of stroke with resultant left-sided hemiparesis, hypertension, history of CABG, history of seizure disorder, wheelchair-bound, hyperlipidemia, chronic diastolic heart failure presents to the ER today with continued fatigue. ?  ?Patient was recently admitted to [facility] from May 20, 2021 through May 23, 2021.  He was admitted there for asthma/bronchitis.  Patient's daughter Butch Penny denies the patient having a history of asthma.  He also had some mild lactic acidosis.  This was treated with steroids.  He had some agitation at night and was ultimately discharged. ?  ?On the night of discharge, daughter states the patient had fallen on the ground.  He was found the next morning by his family.  Patient lives in a house next door to his son.  His grandsons take turns staying with him at night.  His grandsons are older teenagers and young 48 year olds. ?  ?Since his fall, the patient's, continue to complain of fatigue.  Patient is nonambulatory.  He is wheelchair-bound.  He has not had any specific chest pain but does continue to complain of fatigue.  Patient daughter Butch Penny states the patient never complains of wanted, the hospital.  So she found extremely concerning today when the patient asked to be sent to the hospital for evaluation. ?  ?Appetite has been somewhat poor this week but no nausea, vomiting or diarrhea. ?  ?Patient is no longer taking Eliquis.  He was on Eliquis in the past.  Was finally determined that his A-fib was postoperative from his CABG in 2005 and Eliquis was stopped.  He remains on Plavix for his history of peripheral vascular disease and prior stroke. ?  ?On admission the ER, temp 97.7 heart rate 60 blood pressure 143/62 satting 97% on room air. ?  ?Labs sodium 137,  bicarb 18, BUN of 43, creatinine 1.43, magnesium 2.6 ?  ?White count 11.6, hemoglobin 14.9, platelets 273 ?  ?EKG was sinus bradycardia, T wave inversions in lead III and aVF ?  ?Initial troponin I of 207, repeat troponin of 173 ?  ?[facility] hospitalist contacted for admission.  ?  ? ?3/18 no overnight issues. OT rec. SNF ?3/19 no BM per nsg. Pt without any complaints. Reports eating breaksast. ?3/20 K 3.1. no overnight issues ? ?Consultants:  ?cardiology ? ?Procedures:  ? ?Antimicrobials:  ?  ? ? ?Subjective: ?Reports eating breakfast. No omplaints of sob, dizziness, or cp ? ?Objective: ?Vitals:  ? 05/31/21 0600 05/31/21 0635 05/31/21 0744 05/31/21 1212  ?BP:  (!) 108/57 134/81 (!) 117/49  ?Pulse:  65 69 72  ?Resp:  (!) 21 20 (!) 21  ?Temp:  99 ?F (37.2 ?C) 98.6 ?F (37 ?C) 98.2 ?F (36.8 ?C)  ?TempSrc:  Oral Oral Oral  ?SpO2:  100% 95% 100%  ?Weight: 81.9 kg     ?Height:      ? ? ?Intake/Output Summary (Last 24 hours) at 05/31/2021 1309 ?Last data filed at 05/31/2021 0800 ?Gross per 24 hour  ?Intake 772 ml  ?Output 1350 ml  ?Net -578 ml  ? ?Filed Weights  ? 05/29/21 0428 05/30/21 0555 05/31/21 0600  ?Weight: 82.1 kg 81.3 kg 81.9 kg  ? ? ?Examination: ?Calm, NAD, sweet ?Cta no w/r ?Reg s1/s2 no gallop ?Soft benign +bs ?No edema ?Aaoxox3  ?Mood and affect appropriate in current setting  ? ? ?  Data Reviewed: I have personally reviewed following labs and imaging studies ? ?CBC: ?Recent Labs  ?Lab 05/27/21 ?2006 05/28/21 ?0330 05/29/21 ?0946  ?WBC 11.6* 13.9* 8.4  ?NEUTROABS 10.1* 11.2*  --   ?HGB 14.9 14.3 13.1  ?HCT 45.3 42.4 38.2*  ?MCV 96.2 92.8 92.9  ?PLT 273 242 191  ? ?Basic Metabolic Panel: ?Recent Labs  ?Lab 05/27/21 ?2006 05/28/21 ?0330 05/29/21 ?0881 05/31/21 ?1031  ?NA 137 138 139  --   ?K 4.8 4.5 3.4* 3.1*  ?CL 102 100 103  --   ?CO2 18* 21* 26  --   ?GLUCOSE 213* 193* 118*  --   ?BUN 43* 44* 31*  --   ?CREATININE 1.43* 1.42* 1.16  --   ?CALCIUM 8.7* 8.4* 8.3*  --   ?MG 2.6* 2.5*  --   --   ? ?GFR: ?Estimated  Creatinine Clearance: 44.9 mL/min (by C-G formula based on SCr of 1.16 mg/dL). ?Liver Function Tests: ?Recent Labs  ?Lab 05/28/21 ?0330  ?AST 18  ?ALT 21  ?ALKPHOS 39  ?BILITOT 1.2  ?PROT 5.6*  ?ALBUMIN 2.4*  ? ?No results for input(s): LIPASE, AMYLASE in the last 168 hours. ?No results for input(s): AMMONIA in the last 168 hours. ?Coagulation Profile: ?No results for input(s): INR, PROTIME in the last 168 hours. ?Cardiac Enzymes: ?Recent Labs  ?Lab 05/28/21 ?0330  ?CKTOTAL 60  ? ?BNP (last 3 results) ?No results for input(s): PROBNP in the last 8760 hours. ?HbA1C: ?No results for input(s): HGBA1C in the last 72 hours. ? ?CBG: ?Recent Labs  ?Lab 05/30/21 ?5945 05/30/21 ?1547 05/30/21 ?2120 05/31/21 ?0618 05/31/21 ?1128  ?GLUCAP 139* 189* 304* 157* 129*  ? ?Lipid Profile: ?No results for input(s): CHOL, HDL, LDLCALC, TRIG, CHOLHDL, LDLDIRECT in the last 72 hours. ? ?Thyroid Function Tests: ?No results for input(s): TSH, T4TOTAL, FREET4, T3FREE, THYROIDAB in the last 72 hours. ?Anemia Panel: ?No results for input(s): VITAMINB12, FOLATE, FERRITIN, TIBC, IRON, RETICCTPCT in the last 72 hours. ?Sepsis Labs: ?No results for input(s): PROCALCITON, LATICACIDVEN in the last 168 hours. ? ?No results found for this or any previous visit (from the past 240 hour(s)).  ? ? ? ? ? ?Radiology Studies: ?No results found. ? ? ? ? ? ?Scheduled Meds: ? allopurinol  300 mg Oral Daily  ? aspirin EC  81 mg Oral Daily  ? atorvastatin  80 mg Oral Daily  ? clopidogrel  75 mg Oral Daily  ? divalproex  500 mg Oral QHS  ? doxepin  10 mg Oral QHS  ? enoxaparin (LOVENOX) injection  40 mg Subcutaneous Q24H  ? escitalopram  10 mg Oral Daily  ? ezetimibe  10 mg Oral Daily  ? insulin aspart  0-15 Units Subcutaneous TID WC  ? insulin aspart  0-5 Units Subcutaneous QHS  ? insulin glargine  20 Units Subcutaneous QHS  ? metoprolol tartrate  50 mg Oral BID  ? niacin  500 mg Oral QHS  ? polyethylene glycol  17 g Oral BID  ? potassium chloride  40 mEq Oral  BID  ? senna-docusate  1 tablet Oral Daily  ? tamsulosin  0.4 mg Oral QPC supper  ? ?Continuous Infusions: ? ? ? ?Assessment & Plan: ?  ?Principal Problem: ?  Elevated troponin ?Active Problems: ?  AKI (acute kidney injury) ([place]) ?  Hyperlipidemia ?  Hypertension associated with diabetes ([place]) ?  S/P CABG (coronary artery bypass graft) ?  Type 2 diabetes mellitus with complication, with long-term current use of insulin (  [place]) ?  Hemiparesis affecting left side as late effect of cerebrovascular accident [facility]) ?  Chronic diastolic (congestive) heart failure (HCC) ?  Seizure disorder ([place]) ?  DNR (do not resuscitate)/DNI(Do Not intubate) ? ?Elevated troponin ?Cardiology consulted ?Echo with normal EF and no regional wall motion abnormalities ?Currently asymptomatic from cardiac standpoint. ?Troponin trending down ?3/20 no plans for any other cardiac evaluation at this time, cardiologist involved  ? ? ? ?AKI (acute kidney injury) ([place]) ?Unsure if has underlying CKD.  ?Hold ARB ?Avoid nephrotoxic meds ?3/20 improved with IV fluids  ?Monitor periodically  ? ?Hypokalemia ?K3.1 ?Will replace with KCl p.o. ?We will check a.m. level ? ?  ? ?Seizure disorder ([place]) ?Chronic  ?3/20 continue Depakote  ? ? ? ? ?S/P CABG (coronary artery bypass graft) ?Continue statin, Zetia, Plavix and aspirin  ? ? ? ?  ?Chronic diastolic (congestive) heart failure (HCC) ?Euvolemic ?DC IV fluids ?  ?Hemiparesis affecting left side as late effect of cerebrovascular accident [facility]) ?Chronic. Pt has paresis of both left UE and LE. Pt is wheelchair bound. ?Continue aspirin and Plavix ?  ?Type 2 diabetes mellitus with complication, with long-term current use of insulin ([place]) ?Hold jardiance given AKI. Continue with SSI and lantus. ?  ? ?  ?Hypertension associated with diabetes ([place]) ?Pt is bradycardic with HR in the 50s. May need to hold/reduce lopressor dose. ?  ?Hyperlipidemia ?Check lipid panel ? ? ?Consult OT-rec. SNF ? ?DVT prophylaxis:  Lovenox ?Code Status: DNR ?Family Communication: None at bedside ?Disposition Plan: To SNF ?Status is: Inpatient ?The patient is inpatient due to unsafe discharge.   ?Patient is medically stable for discharge to SNF.  SNF

## 2021-05-31 NOTE — TOC Progression Note (Addendum)
Transition of Care (TOC) - Progression Note  ? ? ?Patient Details  ?Name: Fred [name] ?MRN: [id number] ?Date of Birth: [birth year] ? ?Transition of Care (TOC) CM/SW Contact  ?Ar[place]a [place] Renold Don, LCSWA ?Phone Number: ?05/31/2021, 3:01 PM ? ?Clinical Narrative:    ?CSW contacted [facility] who will have a bed available tomorrow. CSW spoke with pt daughter about SNF choices, pt daughter stated she would prefer [facility] bc it is close to home.  ? ?Pt daughter contacted CSW back after speaking with her sister the family would like [facility]. CSW contacted [facility] to confirm bed available tomorrow. Nikki at [facility] confirmed bed, CSW will start auth. Auth ref# [id number] ? ?Pt daughter contacted CSW back to also inquire about [facility] bc [facility] is contracted with the [facility] and pt will need LTC that accepts [employer]. CSW followed up with [facility] and did not get an answer CSW left a VM.  ? ? ?Expected Discharge Plan: [place] ?Barriers to Discharge: Continued Medical Work up, Ship broker, SNF Pending bed offer ? ?Expected Discharge Plan and Services ?Expected Discharge Plan: [place] ?  ?  ?  ?  ?                ?  ?  ?  ?  ?  ?  ?  ?  ?  ?  ? ? ?Social Determinants of Health (SDOH) Interventions ?  ? ?Readmission Risk Interventions ?No flowsheet data found. ? ?

## 2021-06-01 ENCOUNTER — Inpatient Hospital Stay (HOSPITAL_COMMUNITY): Payer: Medicare Other

## 2021-06-01 DIAGNOSIS — N179 Acute kidney failure, unspecified: Secondary | ICD-10-CM | POA: Diagnosis not present

## 2021-06-01 DIAGNOSIS — I2510 Atherosclerotic heart disease of native coronary artery without angina pectoris: Secondary | ICD-10-CM

## 2021-06-01 DIAGNOSIS — E1159 Type 2 diabetes mellitus with other circulatory complications: Secondary | ICD-10-CM

## 2021-06-01 DIAGNOSIS — Z951 Presence of aortocoronary bypass graft: Secondary | ICD-10-CM

## 2021-06-01 DIAGNOSIS — E876 Hypokalemia: Secondary | ICD-10-CM

## 2021-06-01 DIAGNOSIS — R778 Other specified abnormalities of plasma proteins: Secondary | ICD-10-CM | POA: Diagnosis not present

## 2021-06-01 DIAGNOSIS — R059 Cough, unspecified: Secondary | ICD-10-CM

## 2021-06-01 DIAGNOSIS — G40909 Epilepsy, unspecified, not intractable, without status epilepticus: Secondary | ICD-10-CM

## 2021-06-01 DIAGNOSIS — I152 Hypertension secondary to endocrine disorders: Secondary | ICD-10-CM

## 2021-06-01 LAB — RENAL FUNCTION PANEL
Albumin: 2.1 g/dL — ABNORMAL LOW (ref 3.5–5.0)
Anion gap: 10 (ref 5–15)
BUN: 12 mg/dL (ref 8–23)
CO2: 24 mmol/L (ref 22–32)
Calcium: 8.3 mg/dL — ABNORMAL LOW (ref 8.9–10.3)
Chloride: 106 mmol/L (ref 98–111)
Creatinine, Ser: 1 mg/dL (ref 0.61–1.24)
GFR, Estimated: >60 mL/min (ref >60)
Glucose, Bld: 143 mg/dL — ABNORMAL HIGH (ref 70–99)
Phosphorus: 1.9 mg/dL — ABNORMAL LOW (ref 2.5–4.6)
Potassium: 3.8 mmol/L (ref 3.5–5.1)
Sodium: 140 mmol/L (ref 135–145)

## 2021-06-01 LAB — GLUCOSE, CAPILLARY
Glucose-Capillary: 147 mg/dL — ABNORMAL HIGH (ref 70–99)
Glucose-Capillary: 170 mg/dL — ABNORMAL HIGH (ref 70–99)
Glucose-Capillary: 132 mg/dL — ABNORMAL HIGH (ref 70–99)
Glucose-Capillary: 166 mg/dL — ABNORMAL HIGH (ref 70–99)
Glucose-Capillary: 157 mg/dL — ABNORMAL HIGH (ref 70–99)
Glucose-Capillary: 162 mg/dL — ABNORMAL HIGH (ref 70–99)

## 2021-06-01 LAB — POTASSIUM: Potassium: 3.8 mmol/L (ref 3.5–5.1)

## 2021-06-01 MED ORDER — GUAIFENESIN ER 600 MG PO TB12
600.0000 mg | ORAL_TABLET | Freq: Two times a day (BID) | ORAL | Status: DC
  Administered 2021-06-01 – 2021-06-10 (×19): 600 mg via ORAL
  Filled 2021-06-01 (×19): qty 1

## 2021-06-01 MED ORDER — METOPROLOL TARTRATE 25 MG PO TABS
25.0000 mg | ORAL_TABLET | Freq: Two times a day (BID) | ORAL | Status: DC
  Administered 2021-06-01 – 2021-06-09 (×16): 25 mg via ORAL
  Filled 2021-06-01 (×16): qty 1

## 2021-06-01 MED ORDER — SODIUM PHOSPHATES 45 MMOLE/15ML IV SOLN
15.0000 mmol | Freq: Once | INTRAVENOUS | Status: AC
  Administered 2021-06-01: 15 mmol via INTRAVENOUS
  Filled 2021-06-01: qty 5

## 2021-06-01 NOTE — Progress Notes (Addendum)
?PROGRESS NOTE ? ? ? ?Fred [name]  VFI:[id number] DOB: 10/15/1935 DOA: 05/27/2021 ?PCP: [facility], [facility]  ? ? ?Brief Narrative:  ?86 year old male with history of type 2 diabetes on insulin, history of stroke with resultant left-sided hemiparesis, hypertension, history of CABG, history of seizure disorder, wheelchair-bound, hyperlipidemia, chronic diastolic heart failure presents to the ER today with continued fatigue. ?  ?Patient was recently admitted to [facility] from May 20, 2021 through May 23, 2021.  He was admitted there for asthma/bronchitis.  Patient's daughter Butch Penny denies the patient having a history of asthma.  He also had some mild lactic acidosis.  This was treated with steroids.  He had some agitation at night and was ultimately discharged. ?  ?On the night of discharge, daughter states the patient had fallen on the ground.  He was found the next morning by his family.  Patient lives in a house next door to his son.  His grandsons take turns staying with him at night.  His grandsons are older teenagers and young 73 year olds. ?  ?Since his fall, the patient's, continue to complain of fatigue.  Patient is nonambulatory.  He is wheelchair-bound.  He has not had any specific chest pain but does continue to complain of fatigue.  Patient daughter Butch Penny states the patient never complains of wanted, the hospital.  So she found extremely concerning today when the patient asked to be sent to the hospital for evaluatio ?  ?Patient is no longer taking Eliquis.  He was on Eliquis in the past.  Was finally determined that his A-fib was postoperative from his CABG in 2005 and Eliquis was stopped.  He remains on Plavix for his history of peripheral vascular disease and prior stroke. ?  ?Was admitted and found with elevated troponin.  Cardiology was not seeking ischemic work-up.  He was also found with AKI which improved with IV fluids.  Plan is for SNF when medically ready for  discharge ? ?3/21 daughter concerned pt coughing, and is sleepy. Feels that he has been drawsy. Reviewed labs with her from this am. Told her will obtain cxr . ? ?Consultants:  ?cardiology ? ?Procedures:  ? ?Antimicrobials:  ?  ? ? ?Subjective: ?Very sleepy this AM.  Having a little dry cough.  No shortness of breath or chest pain reported ? ?Objective: ?Vitals:  ? 05/31/21 1212 05/31/21 1930 06/01/21 0335 06/01/21 4166  ?BP: (!) 117/49 (!) 111/50 (!) 109/57 (!) 123/51  ?Pulse: 72 65 63 68  ?Resp: (!) 21 20 (!) 23   ?Temp: 98.2 ?F (36.8 ?C) 98.5 ?F (36.9 ?C) 97.7 ?F (36.5 ?C)   ?TempSrc: Oral Oral Oral   ?SpO2: 100% 95% 96%   ?Weight:   80 kg   ?Height:      ? ? ?Intake/Output Summary (Last 24 hours) at 06/01/2021 1256 ?Last data filed at 06/01/2021 1030 ?Gross per 24 hour  ?Intake 360 ml  ?Output 1000 ml  ?Net -640 ml  ? ?Filed Weights  ? 05/30/21 0555 05/31/21 0600 06/01/21 0335  ?Weight: 81.3 kg 81.9 kg 80 kg  ? ? ?Examination: ?Calm, NAD, drawsy ?poor respiratory effort no wheezing ?Reg s1/s2 no gallop ?Soft benign +bs ?No edema ?Mood and affect appropriate in current setting  ? ? ?Data Reviewed: I have personally reviewed following labs and imaging studies ? ?CBC: ?Recent Labs  ?Lab 05/27/21 ?2006 05/28/21 ?0330 05/29/21 ?0946  ?WBC 11.6* 13.9* 8.4  ?NEUTROABS 10.1* 11.2*  --   ?HGB 14.9 14.3 13.1  ?  HCT 45.3 42.4 38.2*  ?MCV 96.2 92.8 92.9  ?PLT 273 242 191  ? ?Basic Metabolic Panel: ?Recent Labs  ?Lab 05/27/21 ?2006 05/28/21 ?0330 05/29/21 ?9449 05/31/21 ?6759 06/01/21 ?0340  ?NA 137 138 139  --  140  ?K 4.8 4.5 3.4* 3.1* 3.8  3.8  ?CL 102 100 103  --  106  ?CO2 18* 21* 26  --  24  ?GLUCOSE 213* 193* 118*  --  143*  ?BUN 43* 44* 31*  --  12  ?CREATININE 1.43* 1.42* 1.16  --  1.00  ?CALCIUM 8.7* 8.4* 8.3*  --  8.3*  ?MG 2.6* 2.5*  --   --   --   ?PHOS  --   --   --   --  1.9*  ? ?GFR: ?Estimated Creatinine Clearance: 51.4 mL/min (by C-G formula based on SCr of 1 mg/dL). ?Liver Function Tests: ?Recent Labs  ?Lab  05/28/21 ?0330 06/01/21 ?0340  ?AST 18  --   ?ALT 21  --   ?ALKPHOS 39  --   ?BILITOT 1.2  --   ?PROT 5.6*  --   ?ALBUMIN 2.4* 2.1*  ? ?No results for input(s): LIPASE, AMYLASE in the last 168 hours. ?No results for input(s): AMMONIA in the last 168 hours. ?Coagulation Profile: ?No results for input(s): INR, PROTIME in the last 168 hours. ?Cardiac Enzymes: ?Recent Labs  ?Lab 05/28/21 ?0330  ?CKTOTAL 60  ? ?BNP (last 3 results) ?No results for input(s): PROBNP in the last 8760 hours. ?HbA1C: ?No results for input(s): HGBA1C in the last 72 hours. ? ?CBG: ?Recent Labs  ?Lab 05/31/21 ?1128 05/31/21 ?1614 05/31/21 ?2128 06/01/21 ?1638 06/01/21 ?1153  ?GLUCAP 129* 200* 191* 132* 166*  ? ?Lipid Profile: ?No results for input(s): CHOL, HDL, LDLCALC, TRIG, CHOLHDL, LDLDIRECT in the last 72 hours. ? ?Thyroid Function Tests: ?No results for input(s): TSH, T4TOTAL, FREET4, T3FREE, THYROIDAB in the last 72 hours. ?Anemia Panel: ?No results for input(s): VITAMINB12, FOLATE, FERRITIN, TIBC, IRON, RETICCTPCT in the last 72 hours. ?Sepsis Labs: ?No results for input(s): PROCALCITON, LATICACIDVEN in the last 168 hours. ? ?No results found for this or any previous visit (from the past 240 hour(s)).  ? ? ? ? ? ?Radiology Studies: ?DG CHEST PORT 1 VIEW ? ?Result Date: 06/01/2021 ?CLINICAL DATA:  Cough EXAM: PORTABLE CHEST 1 VIEW COMPARISON:  Chest radiograph 05/27/2021 FINDINGS: Median sternotomy wires and mediastinal surgical clips are unchanged. The heart is mildly enlarged, unchanged. The upper mediastinal contours are stable. There is calcified atherosclerotic plaque of the aortic arch. Linear opacities in the left lung base likely reflect subsegmental atelectasis. Otherwise, there is no focal consolidation. There is no pulmonary edema. There is no pleural effusion or pneumothorax. There is no acute osseous abnormality. IMPRESSION: Probable subsegmental atelectasis in the left lung base. Otherwise, no focal consolidation or pleural  effusion. Electronically Signed   By: Valetta Mole M.D.   On: 06/01/2021 12:20   ? ? ? ? ? ?Scheduled Meds: ? allopurinol  300 mg Oral Daily  ? aspirin EC  81 mg Oral Daily  ? atorvastatin  80 mg Oral Daily  ? clopidogrel  75 mg Oral Daily  ? divalproex  500 mg Oral QHS  ? doxepin  10 mg Oral QHS  ? enoxaparin (LOVENOX) injection  40 mg Subcutaneous Q24H  ? escitalopram  10 mg Oral Daily  ? ezetimibe  10 mg Oral Daily  ? insulin aspart  0-15 Units Subcutaneous TID WC  ? insulin aspart  0-5 Units Subcutaneous QHS  ? insulin glargine  20 Units Subcutaneous QHS  ? metoprolol tartrate  25 mg Oral BID  ? niacin  500 mg Oral QHS  ? polyethylene glycol  17 g Oral BID  ? senna-docusate  1 tablet Oral Daily  ? tamsulosin  0.4 mg Oral QPC supper  ? ?Continuous Infusions: ? sodium phosphate  Dextrose 5% IVPB    ? ? ? ?Assessment & Plan: ?  ?Principal Problem: ?  Elevated troponin ?Active Problems: ?  AKI (acute kidney injury) ([place]) ?  Hyperlipidemia ?  Hypertension associated with diabetes ([place]) ?  S/P CABG (coronary artery bypass graft) ?  Type 2 diabetes mellitus with complication, with long-term current use of insulin ([place]) ?  Hemiparesis affecting left side as late effect of cerebrovascular accident [facility]) ?  Chronic diastolic (congestive) heart failure (HCC) ?  Seizure disorder ([place]) ?  DNR (do not resuscitate)/DNI(Do Not intubate) ? ?Elevated troponin ?Cardiology consulted ?Echo with normal EF and no regional wall motion abnormalities ?Currently asymptomatic from cardiac standpoint. ?Troponin trending down ?3/21 no plans for any other cardiac evaluation at this time.   ? ? ? ? ?AKI (acute kidney injury) ([place]) ?Unsure if has underlying CKD.  ?Hold ARB ?Avoid nephrotoxic meds ?3/21 improved with IV fluids ? ?Hypophosphatemia ?Phosphorus 1.9 ?We will supplement ? ?Hypokalemia ?Placed and now stable ? ?  ? ?Seizure disorder ([place]) ?Chronic  ? continue Depakote  ? ?Cough ?No sob. ?Afebrile ?Will obtain cxr ?Start mucinex ?[place]. ? ? ?S/P CABG (coronary artery bypass graft) ?Continue statin, Zetia, Plavix and aspirin  ? ? ? ?  ?Chronic diastolic (congestive) heart failure (HCC) ?Euvolemic ?DC IV fluids ?  ?Hemiparesis affecting le

## 2021-06-01 NOTE — TOC Progression Note (Addendum)
Transition of Care (TOC) - Progression Note  ? ? ?Patient Details  ?Name: Fred [name] ?MRN: [id number] ?Date of Birth: 1935-07-31 ? ?Transition of Care (TOC) CM/SW Contact  ?Mercadez Heitman Renold Don, LCSWA ?Phone Number: ?06/01/2021, 1:01 PM ? ?Clinical Narrative:    ?CSW has been informed that pt daughter would like to change SNF. CSW contacted Pt daughter asked CSW if [facility] was available for pt LTC. CSW contacted [facility] for confirmation of beds, CSW was informed that they are no admissions person and there are no beds available. CSW asked about skilled beds and was told that their facility does not have skilled. CSW updated pt daughter who stated she spoke with the same person yesterday and they told her they have skilled beds and they take [place] insurance. Pt daughter is not wanting pt to have to be transferred after SNF to LTC. Pt daughter expressed the concern for the pt telling her he does not feel better and doe snot want to go. Pt daughter has decided to appeal the DC if the MD follows through with the DC. CSW spoke with the MD and provided an update. Pt will be watched overnight and possibly Dc'ed tomorrow. CSW will update Nikki at [facility] and continue to follow pt for DC planning needs.  ?CSW contacted [facility] back to see if they could take pt for LTC. They do not have any male beds at this time and does not offer skilled care. Pt daughter would like CSW to try [facility].  ?CSW spoke with [facility] to inquire about a bed, they do not have any long term but they have short term and they do not accept [place] insurance. CSW contacted pt daughter to inform her that [facility] could not accept pt.  ?Pt daughter asked if CSW could check with [facility], [place] contacted [place] at [facility] who shared that they do not take any [facility] benefits and they don't have any long term beds available.  ? ? ?Expected Discharge Plan: [place] ?Barriers to Discharge: Continued Medical Work up, Psychologist, clinical, SNF Pending bed offer ? ?Expected Discharge Plan and Services ?Expected Discharge Plan: [place] ?  ?  ?  ?  ?                ?  ?  ?  ?  ?  ?  ?  ?  ?  ?  ? ? ?Social Determinants of Health (SDOH) Interventions ?  ? ?Readmission Risk Interventions ?No flowsheet data found. ? ?

## 2021-06-01 NOTE — Evaluation (Addendum)
Physical Therapy Evaluation ?Patient Details ?Name: Fred [name] ?MRN: [id number] ?DOB: 1936-03-14 ?Today's Date: 06/01/2021 ? ?History of Present Illness ? 86 year old male with history of type 2 diabetes on insulin, history of stroke with resultant left-sided hemiparesis, hypertension, history of CABG, history of seizure disorder, wheelchair-bound, hyperlipidemia, chronic diastolic heart failure presents to the with continued fatigue.  ?Clinical Impression ? PTA, pt living at home with 24/7 assist from family and requiring min assist for pivot transfers. Pt is displaying a significant change from this baseline currently. Pt presents with decreased functional mobility secondary to left hemiparesis (baseline), generalized weakness, poor sitting balance, and decreased activity tolerance. Requiring two person maximal assist for bed mobility. Worked on static sitting balance edge of bed to promote neutral/midline. Pt unable to stand. Will benefit from SNF at discharge to address deficits, maximize functional mobility and decrease caregiver burden. ?   ? ?Recommendations for follow up therapy are one component of a multi-disciplinary discharge planning process, led by the attending physician.  Recommendations may be updated based on patient status, additional functional criteria and insurance authorization. ? ?Follow Up Recommendations Skilled nursing-short term rehab (<3 hours/day) ? ?  ?Assistance Recommended at Discharge Frequent or constant Supervision/Assistance  ?Patient can return home with the following ? Two people to help with walking and/or transfers;A lot of help with bathing/dressing/bathroom ? ?  ?Equipment Recommendations Hospital bed;Other (comment) (hoyer lift)  ?Recommendations for Other Services ?    ?  ?Functional Status Assessment Patient has had a recent decline in their functional status and/or demonstrates limited ability to make significant improvements in function in a reasonable and  predictable amount of time  ? ?  ?Precautions / Restrictions Precautions ?Precautions: Fall;Other (comment) ?Precaution Comments: L hemiparesis (baseline) ?Restrictions ?Weight Bearing Restrictions: No  ? ?  ? ?Mobility ? Bed Mobility ?Overal bed mobility: Needs Assistance ?Bed Mobility: Supine to Sit, Sit to Supine ?  ?  ?Supine to sit: Max assist, +2 for physical assistance ?Sit to supine: Max assist, +2 for physical assistance ?  ?General bed mobility comments: Cues for initiating with RLE, requiring assist for left side and use of bed pad to scoot hips forward ?  ? ?Transfers ?  ?  ?  ?  ?  ?  ?  ?  ?  ?General transfer comment: unable ?  ? ?Ambulation/Gait ?  ?  ?  ?  ?  ?  ?  ?General Gait Details: unable ? ?Stairs ?  ?  ?  ?  ?  ? ?Wheelchair Mobility ?  ? ?Modified Rankin (Stroke Patients Only) ?  ? ?  ? ?Balance Overall balance assessment: Needs assistance ?Sitting-balance support: Single extremity supported, Feet supported ?Sitting balance-Leahy Scale: Poor ?Sitting balance - Comments: right lateral and posterior lean, requiring close min guard-maxA ?Postural control: Posterior lean ?  ?  ?  ?  ?  ?  ?  ?  ?  ?  ?  ?  ?  ?  ?   ? ? ? ?Pertinent Vitals/Pain Pain Assessment ?Pain Assessment: No/denies pain  ? ? ?Home Living Family/patient expects to be discharged to:: Private residence ?Living Arrangements: Children;Other relatives ?Available Help at Discharge: Family;Available 24 hours/day ?Type of Home: House ?Home Access: Ramped entrance ?  ?  ?  ?Home Layout: Two level;Able to live on main level with bedroom/bathroom ?Home Equipment: Wheelchair - power;Shower seat - built in ?Additional Comments: Reports grandson stays with him at night  ?  ?Prior Function Prior Level  of Function : Needs assist ?  ?  ?  ?  ?  ?  ?Mobility Comments: 2-3 person assist for transferring to w/c ?ADLs Comments: requiring assist ?  ? ? ?Hand Dominance  ? Dominant Hand: Right ? ?  ?Extremity/Trunk Assessment  ? Upper Extremity  Assessment ?Upper Extremity Assessment: Defer to OT evaluation ?  ? ?Lower Extremity Assessment ?Lower Extremity Assessment: RLE deficits/detail;LLE deficits/detail ?RLE Deficits / Details: Grossly 2/5 ?LLE Deficits / Details: Residual deficits from stroke ?  ? ?Cervical / Trunk Assessment ?Cervical / Trunk Assessment: Kyphotic  ?Communication  ? Communication: No difficulties  ?Cognition Arousal/Alertness: Awake/alert ?Behavior During Therapy: [facility] for tasks assessed/performed ?Overall Cognitive Status: History of cognitive impairments - at baseline ?  ?  ?  ?  ?  ?  ?  ?  ?  ?  ?  ?  ?  ?  ?  ?  ?General Comments: cueing for locating calendar in room to identify date ?  ?  ? ?  ?General Comments   ? ?  ?Exercises    ? ?Assessment/Plan  ?  ?PT Assessment Patient needs continued PT services  ?PT Problem List Decreased strength;Decreased activity tolerance;Decreased balance;Decreased mobility;Decreased cognition ? ?   ?  ?PT Treatment Interventions Functional mobility training;Therapeutic activities;Therapeutic exercise;Balance training;Patient/family education   ? ?PT Goals (Current goals can be found in the Care Plan section)  ?Acute Rehab PT Goals ?Patient Stated Goal: did not state ?PT Goal Formulation: With patient ?Time For Goal Achievement: 06/15/21 ?Potential to Achieve Goals: Fair ? ?  ?Frequency Min 2X/week ?  ? ? ?Co-evaluation   ?  ?  ?  ?  ? ? ?  ?AM-PAC PT "6 Clicks" Mobility  ?Outcome Measure Help needed turning from your back to your side while in a flat bed without using bedrails?: A Lot ?Help needed moving from lying on your back to sitting on the side of a flat bed without using bedrails?: Total ?Help needed moving to and from a bed to a chair (including a wheelchair)?: Total ?Help needed standing up from a chair using your arms (e.g., wheelchair or bedside chair)?: Total ?Help needed to walk in hospital room?: Total ?Help needed climbing 3-5 steps with a railing? : Total ?6 Click Score: 7 ? ?  ?End  of Session   ?Activity Tolerance: Patient tolerated treatment well ?Patient left: in bed;with call bell/phone within reach;with bed alarm set ?  ?PT Visit Diagnosis: Muscle weakness (generalized) (M62.81);Other abnormalities of gait and mobility (R26.89) ?  ? ?Time: 1779-3903 ?PT Time Calculation (min) (ACUTE ONLY): 19 min ? ? ?Charges:   PT Evaluation ?$PT Eval Moderate Complexity: 1 Mod ?  ?  ?   ? ? ?Wyona Almas, PT, DPT ?Acute Rehabilitation Services ?Pager [phone] ?Office [phone] ? ? ?Deno Etienne ?06/01/2021, 5:21 PM ? ?

## 2021-06-01 NOTE — Care Management Important Message (Signed)
Important Message ? ?Patient Details  ?Name: Fred [name] ?MRN: [id number] ?Date of Birth: Jan 11, 1936 ? ? ?Medicare Important Message Given:  Yes ? ? ? ? ?Shelda Altes ?06/01/2021, 8:06 AM ?

## 2021-06-02 DIAGNOSIS — R778 Other specified abnormalities of plasma proteins: Secondary | ICD-10-CM | POA: Diagnosis not present

## 2021-06-02 LAB — GLUCOSE, CAPILLARY
Glucose-Capillary: 103 mg/dL — ABNORMAL HIGH (ref 70–99)
Glucose-Capillary: 89 mg/dL (ref 70–99)
Glucose-Capillary: 194 mg/dL — ABNORMAL HIGH (ref 70–99)
Glucose-Capillary: 194 mg/dL — ABNORMAL HIGH (ref 70–99)

## 2021-06-02 NOTE — Progress Notes (Signed)
Occupational Therapy Treatment ?Patient Details ?Name: Fred [name] ?MRN: [id number] ?DOB: October 28, 1935 ?Today's Date: 06/02/2021 ? ? ?History of present illness 86 year old male with history of type 2 diabetes on insulin, history of stroke with resultant left-sided hemiparesis, hypertension, history of CABG, history of seizure disorder, wheelchair-bound, hyperlipidemia, chronic diastolic heart failure presents to the with continued fatigue. ?  ?OT comments ? Patient with incremental progress toward patient focused goals.  Patient's biggest deficit is progressing weakness and the inability to assist with bed mobility, transfers and ADL completion, particularly over the last few weeks prior to this admit.  Prior to this, although he needed assist from family, it was closer to Moderate assist with ADL completion, and, at times, Min A for stand pivot transfers at home.  Per the daughter, he has decompensated to over the past 3 to 4 weeks at home.  Currently, he is needing up to Max A of 2 for basic bed mobility, and has been unable to participate with any transfers.  OT currently recommends a hoyer lift for out of bed safely, and he has been bedlevel for toileting and ADL completion.  The family is unable to provide this level of assistance at home, and OT continues to strongly recommend SNF for attempted short term rehab to maximize his functional status.  Ultimately, the patient may need to be transitioned to LTC.  If he were to transition home with family, which is not realistic, he would need 24 hour +2 assist, a hospital bed with a pressure relieving mattress, a hoyer lift for any transfers out of bed, and a wheelchair for any mobility with a pressure relieving cushion. OT will continue efforts in the acute setting, but a more aggressive level of rehab is needed, as the patient will only continue to weaken in the acute care setting.     ? ?Recommendations for follow up therapy are one component of a  multi-disciplinary discharge planning process, led by the attending physician.  Recommendations may be updated based on patient status, additional functional criteria and insurance authorization. ?   ?Follow Up Recommendations ? Skilled nursing-short term rehab (<3 hours/day)  ?  ?Assistance Recommended at Discharge Frequent or constant Supervision/Assistance  ?Patient can return home with the following ? Two people to help with walking and/or transfers;Two people to help with bathing/dressing/bathroom;Direct supervision/assist for medications management;Direct supervision/assist for financial management;Assist for transportation;Assistance with cooking/housework;Help with stairs or ramp for entrance ?  ?Equipment Recommendations ? Hospital bed;Other (comment);Wheelchair cushion (measurements OT);Wheelchair (measurements OT) Harrel Lemon lift)  ?  ?Recommendations for Other Services   ? ?  ?Precautions / Restrictions Precautions ?Precautions: Fall ?Precaution Comments: L hemiparesis (baseline) ?Restrictions ?Weight Bearing Restrictions: No  ? ? ?  ? ?Mobility Bed Mobility ?  ?Bed Mobility: Sidelying to Sit, Sit to Sidelying ?  ?Sidelying to sit: Max assist ?  ?  ?Sit to sidelying: Max assist ?  ?  ? ?Transfers ?  ?  ?  ?  ?  ?  ?  ?  ?  ?General transfer comment: unable.  Patient does not have the currenlt ability to clear his hips from the bed. ?  ?  ?Balance Overall balance assessment: Needs assistance ?Sitting-balance support: Single extremity supported, Feet supported ?Sitting balance-Leahy Scale: Poor ?  ?Postural control: Posterior lean, Right lateral lean ?  ?  ?  ?  ?  ?  ?  ?  ?  ?  ?  ?  ?  ?  ?   ? ?  ADL either performed or assessed with clinical judgement  ? ?ADL   ?Eating/Feeding: Minimal assistance;Bed level ?  ?Grooming: Wash/dry hands;Wash/dry face;Moderate assistance;Sitting ?  ?  ?  ?  ?  ?Upper Body Dressing : Maximal assistance;Sitting ?  ?Lower Body Dressing: Total assistance;Bed level ?  ?Toilet  Transfer: Total assistance ?  ?  ?  ?  ?  ?  ?  ?  ? ?Extremity/Trunk Assessment Upper Extremity Assessment ?Upper Extremity Assessment: Generalized weakness ?LUE Deficits / Details: trace AROM and tighting noted to all pivots of LUE.   May need resting hand splint ?LUE Coordination: decreased fine motor;decreased gross motor ?  ?Lower Extremity Assessment ?Lower Extremity Assessment: Defer to PT evaluation ?  ?  ?  ? ?Vision   ?  ?  ?Perception   ?  ?Praxis   ?  ? ?Cognition Arousal/Alertness: Awake/alert ?Behavior During Therapy: [facility] for tasks assessed/performed ?Overall Cognitive Status: History of cognitive impairments - at baseline ?  ?  ?  ?  ?  ?  ?  ?  ?  ?  ?  ?  ?  ?  ?  ?  ?General Comments: waxes and wanes - this session patient stating he can walk. ?  ?  ?   ?   ? ?  ?   ? ? ?  ?    ? ? ?Pertinent Vitals/ Pain       Pain Assessment ?Pain Assessment: No/denies pain ? ?   ?  ?  ?  ?  ?  ?  ?  ?  ?  ?  ?  ?  ?  ?  ?  ?  ?  ?  ? ?  ?    ?  ?  ?  ?   ? ?Frequency ? Min 2X/week  ? ? ? ? ?  ?Progress Toward Goals ? ?OT Goals(current goals can now be found in the care plan section) ? Progress towards OT goals: Progressing toward goals ? ?Acute Rehab OT Goals ?Patient Stated Goal: family is looking for rehab and possible LTC placement. ?OT Goal Formulation: With family ?Time For Goal Achievement: 06/11/21 ?Potential to Achieve Goals: Good  ?Plan Discharge plan remains appropriate   ? ?Co-evaluation ? ? ?   ?  ?  ?  ?  ? ?  ?AM-PAC OT "6 Clicks" Daily Activity     ?Outcome Measure ? ? Help from another person eating meals?: A Little ?Help from another person taking care of personal grooming?: A Lot ?Help from another person toileting, which includes using toliet, bedpan, or urinal?: Total ?Help from another person bathing (including washing, rinsing, drying)?: A Lot ?Help from another person to put on and taking off regular upper body clothing?: A Lot ?Help from another person to put on and taking off regular  lower body clothing?: Total ?6 Click Score: 11 ? ?  ?End of Session   ? ?OT Visit Diagnosis: Muscle weakness (generalized) (M62.81);Hemiplegia and hemiparesis;Other symptoms and signs involving cognitive function ?Hemiplegia - Right/Left: Left ?Hemiplegia - dominant/non-dominant: Non-Dominant ?Hemiplegia - caused by: Cerebral infarction ?  ?Activity Tolerance Patient tolerated treatment well ?  ?Patient Left in bed;with call bell/phone within reach;with family/visitor present ?  ?Nurse Communication Mobility status ?  ? ?   ? ?Time: 9629-5284 ?OT Time Calculation (min): 17 min ? ?Charges: OT General Charges ?$OT Visit: 1 Visit ?OT Treatments ?$Self Care/Home Management : 8-22 mins ? ?06/02/2021 ? ?RP, OTR/L ? ?Acute Rehabilitation  Services ? ?Office:  [phone] ? ? ?Anahid Eskelson D Euna Armon ?06/02/2021, 3:26 PM ?

## 2021-06-02 NOTE — TOC Progression Note (Signed)
Transition of Care (TOC) - Progression Note  ? ? ?Patient Details  ?Name: Fred [name] ?MRN: [id number] ?Date of Birth: [birth year] ? ?Transition of Care (TOC) CM/SW Contact  ?Nyquan Selbe Renold Don, LCSWA ?Phone Number: ?06/02/2021, 1:55 PM ? ?Clinical Narrative:    ?Pt Fred [name] was denied CSW updated pt daughter to provide update. Pt daughter would like to continue looking for LTC facilities through the [place]. Pt daughter will provide [facility] social worker CSW number to assist with finding a facility.  ? ? ?Expected Discharge Plan: [place] ?Barriers to Discharge: Continued Medical Work up, Ship broker, SNF Pending bed offer ? ?Expected Discharge Plan and Services ?Expected Discharge Plan: [place] ?  ?  ?  ?  ?                ?  ?  ?  ?  ?  ?  ?  ?  ?  ?  ? ? ?Social Determinants of Health (SDOH) Interventions ?  ? ?Readmission Risk Interventions ?   ? View : No data to display.  ?  ?  ?  ? ? ?

## 2021-06-02 NOTE — Progress Notes (Signed)
Mobility Specialist Progress Note: ? ? 06/02/21 1500  ?Mobility  ?Activity Turned to right side;Turned to left side  ?Level of Assistance Moderate assist, patient does 50-74%  ?Activity Response Tolerated well  ?$Mobility charge 1 Mobility  ? ?RN requesting assistance to roll pt in bed to perform pericare. Pt able to roll with modA.  ? ?[place]elta Numbers ?Acute Rehab ?Phone: [phone] ?Office Phone: [phone] ? ?

## 2021-06-02 NOTE — Progress Notes (Signed)
?PROGRESS NOTE ? ? ? ?Fred [name]  BWL:[id number] DOB: 07/08/1935 DOA: 05/27/2021 ?PCP: [facility] ? ? ?Brief Narrative:  ?86 year old male with history of type 2 diabetes on insulin, history of stroke with resultant left-sided hemiparesis, hypertension, history of CABG, history of seizure disorder, wheelchair-bound, hyperlipidemia, chronic diastolic heart failure presents to the ER today with worsening fatigue. ?  ?Patient was recently admitted to [facility] from May 20, 2021 through May 23, 2021. Admitted there for asthma/bronchitis.   ? ?Patient lives in a house next door to his family, he has adult senior sitters during the day and family is around at night for any needs.  He typically is able to transfer from bed to motorized scooter or chair with only minimal assist for "balance guarding" per his daughter.  More recently he is having worsening fatigue and weakness requiring 2-3 people and max assist for even simple transfers which is not his baseline.  He continues to have multiple falls while attempting to transfer over the past week due to worsening weakness. ?  ?Assessment & Plan: ?  ?Principal Problem: ?  Elevated troponin ?Active Problems: ?  AKI (acute kidney injury) ([place]) ?  Hyperlipidemia ?  Hypertension associated with diabetes ([place]) ?  S/P CABG (coronary artery bypass graft) ?  Type 2 diabetes mellitus with complication, with long-term current use of insulin ([place]) ?  Hemiparesis affecting left side as late effect of cerebrovascular accident [facility]) ?  Chronic diastolic (congestive) heart failure (HCC) ?  Seizure disorder ([place]) ?  DNR (do not resuscitate)/DNI(Do Not intubate) ? ?Elevated troponin ?Cardiology consulted ?Echo with normal EF and no regional wall motion abnormalities ?No further evaluation or testing per cardiology  ?  ?AKI (acute kidney injury) ([place]) ?Avoid nephrotoxic meds ?Resolving with IV fluids and increase p.o. intake ?  ?Ambulatory dysfunction  acute on chronic  ?Patient able to transfer with only balance guarding and minimal assist at baseline, now requiring 2-3 people and max assist for simple transfers to his motorized chair ? ?Hypophosphatemia- Repleted ?Hypokalemia-repleted, within normal limits  ? ?Seizure disorder ([place]) ?Continue Depakote  ?  ?Cough, nonproductive ?Continue supportive care, nebs, flutter, incentive spirometry ?Chest x-ray unremarkable -remains afebrile without leukocytosis ?  ?S/P CABG (coronary artery bypass graft) ?Continue statin, Zetia, Plavix and aspirin  ?  ?Chronic diastolic (congestive) heart failure (HCC) ?Euvolemic -not in acute exacerbation ?  ?Hemiparesis affecting left side as late effect of cerebrovascular accident [facility]) ?Chronic left upper and lower extremity weakness, right side dominant hand ?Continue aspirin and Plavix ?  ?Type 2 diabetes mellitus with complication, with long-term current use of insulin ([place]) ?Hold jardiance given AKI. Continue with SSI and lantus. ?  ?Hypertension associated with diabetes ([place]) ?Borderline bradycardia in the 50s, decrease home metoprolol to 25 twice daily ?  ?Hyperlipidemia ?Zetia and statin ?   ?DVT prophylaxis: Lovenox ?Code Status: DNR ?Family Communication: Daughter updated ? ?Status is: Inpatient ? ?Dispo: The patient is from: Home ?             Anticipated d/c is to: SNF ?             Anticipated d/c date is: 24 to 48 hours ?             Patient currently is medically stable for discharge ? ?Consultants:  ?Cardiology ? ?Procedures:  ?None ? ?Antimicrobials:  ?None ? ?Subjective: ?No acute issues or events overnight ? ?Objective: ?Vitals:  ? 06/01/21 0335 06/01/21 0958 06/01/21 1929 06/02/21  0335  ?BP: (!) 109/57 (!) 123/51 (!) 105/52 (!) 110/44  ?Pulse: 63 68 61 (!) 58  ?Resp: (!) '23  16 17  '$ ?Temp: 97.7 ?F (36.5 ?C)  98.5 ?F (36.9 ?C) (!) 97.5 ?F (36.4 ?C)  ?TempSrc: Oral  Oral Oral  ?SpO2: 96%  97% 96%  ?Weight: 80 kg   80 kg  ?Height:      ? ? ?Intake/Output Summary (Last  24 hours) at 06/02/2021 0705 ?Last data filed at 06/02/2021 9509 ?Gross per 24 hour  ?Intake 567.51 ml  ?Output 975 ml  ?Net -407.49 ml  ? ?Filed Weights  ? 05/31/21 0600 06/01/21 0335 06/02/21 0335  ?Weight: 81.9 kg 80 kg 80 kg  ? ? ?Examination: ? ?General:  Pleasantly resting in bed, No acute distress. ?HEENT:  Normocephalic atraumatic.  Sclerae nonicteric, noninjected.  Extraocular movements intact bilaterally. ?Neck:  Without mass or deformity.  Trachea is midline. ?Lungs:  Clear to auscultate bilaterally without rhonchi, wheeze, or rales. ?Heart:  Regular rate and rhythm.  Without murmurs, rubs, or gallops. ?Abdomen:  Soft, nontender, nondistended.  Without guarding or rebound. ?Extremities: Left upper lower extremity weakness, chronic ?Vascular:  Dorsalis pedis and posterior tibial pulses palpable bilaterally. ? ?Data Reviewed: I have personally reviewed following labs and imaging studies ? ?CBC: ?Recent Labs  ?Lab 05/27/21 ?2006 05/28/21 ?0330 05/29/21 ?0946  ?WBC 11.6* 13.9* 8.4  ?NEUTROABS 10.1* 11.2*  --   ?HGB 14.9 14.3 13.1  ?HCT 45.3 42.4 38.2*  ?MCV 96.2 92.8 92.9  ?PLT 273 242 191  ? ?Basic Metabolic Panel: ?Recent Labs  ?Lab 05/27/21 ?2006 05/28/21 ?0330 05/29/21 ?3267 05/31/21 ?1245 06/01/21 ?0340  ?NA 137 138 139  --  140  ?K 4.8 4.5 3.4* 3.1* 3.8  3.8  ?CL 102 100 103  --  106  ?CO2 18* 21* 26  --  24  ?GLUCOSE 213* 193* 118*  --  143*  ?BUN 43* 44* 31*  --  12  ?CREATININE 1.43* 1.42* 1.16  --  1.00  ?CALCIUM 8.7* 8.4* 8.3*  --  8.3*  ?MG 2.6* 2.5*  --   --   --   ?PHOS  --   --   --   --  1.9*  ? ?GFR: ?Estimated Creatinine Clearance: 51.4 mL/min (by C-G formula based on SCr of 1 mg/dL). ?Liver Function Tests: ?Recent Labs  ?Lab 05/28/21 ?0330 06/01/21 ?0340  ?AST 18  --   ?ALT 21  --   ?ALKPHOS 39  --   ?BILITOT 1.2  --   ?PROT 5.6*  --   ?ALBUMIN 2.4* 2.1*  ? ?No results for input(s): LIPASE, AMYLASE in the last 168 hours. ?No results for input(s): AMMONIA in the last 168 hours. ?Coagulation  Profile: ?No results for input(s): INR, PROTIME in the last 168 hours. ?Cardiac Enzymes: ?Recent Labs  ?Lab 05/28/21 ?0330  ?CKTOTAL 60  ? ?BNP (last 3 results) ?No results for input(s): PROBNP in the last 8760 hours. ?HbA1C: ?No results for input(s): HGBA1C in the last 72 hours. ?CBG: ?Recent Labs  ?Lab 06/01/21 ?8099 06/01/21 ?1153 06/01/21 ?1529 06/01/21 ?2125 06/02/21 ?8338  ?GLUCAP 132* 166* 157* 162* 103*  ? ?Lipid Profile: ?No results for input(s): CHOL, HDL, LDLCALC, TRIG, CHOLHDL, LDLDIRECT in the last 72 hours. ?Thyroid Function Tests: ?No results for input(s): TSH, T4TOTAL, FREET4, T3FREE, THYROIDAB in the last 72 hours. ?Anemia Panel: ?No results for input(s): VITAMINB12, FOLATE, FERRITIN, TIBC, IRON, RETICCTPCT in the last 72 hours. ?Sepsis Labs: ?No results for  input(s): PROCALCITON, LATICACIDVEN in the last 168 hours. ? ?No results found for this or any previous visit (from the past 240 hour(s)).  ? ? ? ? ? ?Radiology Studies: ?DG CHEST PORT 1 VIEW ? ?Result Date: 06/01/2021 ?CLINICAL DATA:  Cough EXAM: PORTABLE CHEST 1 VIEW COMPARISON:  Chest radiograph 05/27/2021 FINDINGS: Median sternotomy wires and mediastinal surgical clips are unchanged. The heart is mildly enlarged, unchanged. The upper mediastinal contours are stable. There is calcified atherosclerotic plaque of the aortic arch. Linear opacities in the left lung base likely reflect subsegmental atelectasis. Otherwise, there is no focal consolidation. There is no pulmonary edema. There is no pleural effusion or pneumothorax. There is no acute osseous abnormality. IMPRESSION: Probable subsegmental atelectasis in the left lung base. Otherwise, no focal consolidation or pleural effusion. Electronically Signed   By: Valetta Mole M.D.   On: 06/01/2021 12:20   ? ? ? ? ? ?Scheduled Meds: ? allopurinol  300 mg Oral Daily  ? aspirin EC  81 mg Oral Daily  ? atorvastatin  80 mg Oral Daily  ? clopidogrel  75 mg Oral Daily  ? divalproex  500 mg Oral QHS  ?  doxepin  10 mg Oral QHS  ? enoxaparin (LOVENOX) injection  40 mg Subcutaneous Q24H  ? escitalopram  10 mg Oral Daily  ? ezetimibe  10 mg Oral Daily  ? guaiFENesin  600 mg Oral BID  ? insulin aspart  0-15 U

## 2021-06-03 DIAGNOSIS — R778 Other specified abnormalities of plasma proteins: Secondary | ICD-10-CM | POA: Diagnosis not present

## 2021-06-03 LAB — GLUCOSE, CAPILLARY
Glucose-Capillary: 176 mg/dL — ABNORMAL HIGH (ref 70–99)
Glucose-Capillary: 127 mg/dL — ABNORMAL HIGH (ref 70–99)
Glucose-Capillary: 182 mg/dL — ABNORMAL HIGH (ref 70–99)
Glucose-Capillary: 214 mg/dL — ABNORMAL HIGH (ref 70–99)

## 2021-06-03 NOTE — Plan of Care (Signed)
  Problem: Education: Goal: Knowledge of General Education information will improve Description: Including pain rating scale, medication(s)/side effects and non-pharmacologic comfort measures Outcome: Progressing   Problem: Safety: Goal: Ability to remain free from injury will improve Outcome: Progressing   

## 2021-06-03 NOTE — Progress Notes (Signed)
Occupational Therapy Treatment ?Patient Details ?Name: Fred [name] ?MRN: [id number] ?DOB: 09-29-1935 ?Today's Date: 06/03/2021 ? ? ?History of present illness 86 year old male with history of type 2 diabetes on insulin, history of stroke with resultant left-sided hemiparesis, hypertension, history of CABG, history of seizure disorder, wheelchair-bound, hyperlipidemia, chronic diastolic heart failure presents to the with continued fatigue. ?  ?OT comments ? Pt progressing towards OT goals this session, mod A +2 safety for bed mobility, cues for sequencing - but excellent initiation on Johnny's part. Left lateral/posterior lean sitting EOB today requiring min guard to mod A for seated balance. Pt did engage in LB dressing - pulling up socks and participating in grooming tasks. Max A +2 for squat pivot to the recliner - did 4 incremental squat pivots, once in the chair max A +2 for rolling for peri care. VSS throughout session, and Lift pad in place for RN staff to return to bed later today. Pt very pleasant and cooperative throughout. OT will continue to follow acutely. POC remains appropriate at this time.   ? ?Recommendations for follow up therapy are one component of a multi-disciplinary discharge planning process, led by the attending physician.  Recommendations may be updated based on patient status, additional functional criteria and insurance authorization. ?   ?Follow Up Recommendations ? Skilled nursing-short term rehab (<3 hours/day)  ?  ?Assistance Recommended at Discharge Frequent or constant Supervision/Assistance  ?Patient can return home with the following ? Two people to help with walking and/or transfers;Two people to help with bathing/dressing/bathroom;Direct supervision/assist for medications management;Direct supervision/assist for financial management;Assist for transportation;Assistance with cooking/housework;Help with stairs or ramp for entrance ?  ?Equipment Recommendations ? Hospital  bed;Other (comment);Wheelchair cushion (measurements OT);Wheelchair (measurements OT) Harrel Lemon lift)  ?  ?Recommendations for Other Services   ? ?  ?Precautions / Restrictions Precautions ?Precautions: Fall ?Precaution Comments: L hemiparesis (baseline) ?Restrictions ?Weight Bearing Restrictions: No  ? ? ?  ? ?Mobility Bed Mobility ?Overal bed mobility: Needs Assistance ?Bed Mobility: Rolling, Supine to Sit ?Rolling: Mod assist ?  ?Supine to sit: Mod assist, +2 for safety/equipment, HOB elevated ?  ?  ?General bed mobility comments: Cues for initiating with RLE, using bed rail to assist, and also pushing hips fwd with RUE, mod A +2 safety to bring final hips fwd with use of pad, good initiation of movements with cues for sequencing. ?  ? ?Transfers ?Overall transfer level: Needs assistance ?Equipment used: 2 person hand held assist ?Transfers: Bed to chair/wheelchair/BSC, Sit to/from Stand ?Sit to Stand: Max assist, +2 physical assistance, +2 safety/equipment (use of lift pad and rocking to facilitae) ?  ?Squat pivot transfers: Max assist, +2 physical assistance, +2 safety/equipment ?  ?  ?  ?General transfer comment: able to perform stand with LLE knee blocked, bed pad lift support cues for rocking and sequencing and +2 max A. - using similiar cueing squat pivot x4 with drop arm recliner from slightly elevated bed to recliner. ?  ?  ?Balance Overall balance assessment: Needs assistance ?Sitting-balance support: Single extremity supported, Feet supported ?Sitting balance-Leahy Scale: Poor ?Sitting balance - Comments: left lateral and posterior lean, requiring close min guard-modA ?Postural control: Posterior lean, Left lateral lean ?Standing balance support: Bilateral upper extremity supported ?Standing balance-Leahy Scale: Zero ?Standing balance comment: requires LLE blocked for attempts at standing, dependent on therapist assist for balance in squat position - does not come to full upright ?  ?  ?  ?  ?  ?  ?  ?  ?   ?  ?  ?   ? ?  ADL either performed or assessed with clinical judgement  ? ?ADL Overall ADL's : Needs assistance/impaired ?Eating/Feeding: Minimal assistance;Sitting ?Eating/Feeding Details (indicate cue type and reason): assist to finish meds ?  ?Grooming Details (indicate cue type and reason): uses RUE for grooming, cannot bring LUE to face ?  ?  ?  ?  ?Upper Body Dressing : Maximal assistance;Sitting ?  ?Lower Body Dressing: Maximal assistance;Sitting/lateral leans ?Lower Body Dressing Details (indicate cue type and reason): able to reach down and adjust socks ?Toilet Transfer: Maximal assistance;+2 for physical assistance;+2 for safety/equipment;Squat-pivot;BSC/3in1 ?Toilet Transfer Details (indicate cue type and reason): simulated through recliner transfer, squat pivot x4 with bed pad as scoop ?Toileting- Clothing Manipulation and Hygiene: Maximal assistance;+2 for physical assistance;+2 for safety/equipment (roling in chair) ?  ?  ?  ?Functional mobility during ADLs: Maximal assistance;+2 for physical assistance;+2 for safety/equipment (squat pivot to the right) ?  ?  ? ?Extremity/Trunk Assessment Upper Extremity Assessment ?LUE Deficits / Details: minimal AROM in gravity free position and tighting noted to all pivots of LUE. with PROM able to bring digits to full extension and elbow to full straight for cleaning/skin integrity. May need resting hand splint ?LUE Coordination: decreased fine motor;decreased gross motor (baseline) ?  ?  ?  ?  ?  ? ?Vision   ?  ?  ?Perception   ?  ?Praxis   ?  ? ?Cognition Arousal/Alertness: Awake/alert ?Behavior During Therapy: [facility] for tasks assessed/performed ?Overall Cognitive Status: History of cognitive impairments - at baseline ?  ?  ?  ?  ?  ?  ?  ?  ?  ?  ?  ?  ?  ?  ?  ?  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  ?  ?   ?Exercises   ? ?  ?Shoulder Instructions   ? ? ?  ?General Comments VSS  during session  ? ? ?Pertinent Vitals/ Pain       Pain Assessment ?Pain Assessment: No/denies pain ? ?Home Living   ?  ?  ?  ?  ?  ?  ?  ?  ?  ?  ?  ?  ?  ?  ?  ?  ?  ?  ? ?  ?Prior Functioning/Environment    ?  ?  ?  ?   ? ?Frequency ? Min 2X/week  ? ? ? ? ?  ?Progress Toward Goals ? ?OT Goals(current goals can now be found in the care plan section) ? Progress towards OT goals: Progressing toward goals ? ?Acute Rehab OT Goals ?Patient Stated Goal: get home ?OT Goal Formulation: With family ?Time For Goal Achievement: 06/11/21 ?Potential to Achieve Goals: Good ?ADL Goals ?Pt Will Perform Grooming: with supervision;sitting ?Additional ADL Goal #1: Patient will roll to his left with Min A and Mod A to the right to increase independence with toileting. ?Additional ADL Goal #2: Patient will perform stand pivot transfer with Max A of 1 to [facility].  ?Plan Discharge plan remains appropriate   ? ?Co-evaluation ? ? ?   ?  ?  ?  ?  ? ?  ?AM-PAC OT "6 Clicks" Daily Activity     ?Outcome Measure ? ? Help from another person eating meals?: A Little ?Help from another person taking care of personal grooming?: A Lot ?Help from another person toileting, which includes using toliet, bedpan, or urinal?: Total ?Help from another person bathing (  including washing, rinsing, drying)?: A Lot ?Help from another person to put on and taking off regular upper body clothing?: A Lot ?Help from another person to put on and taking off regular lower body clothing?: Total ?6 Click Score: 11 ? ?  ?End of Session Equipment Utilized During Treatment: Gait belt ? ?OT Visit Diagnosis: Muscle weakness (generalized) (M62.81);Hemiplegia and hemiparesis;Other symptoms and signs involving cognitive function ?Hemiplegia - Right/Left: Left ?Hemiplegia - dominant/non-dominant: Non-Dominant ?Hemiplegia - caused by: Cerebral infarction ?  ?Activity Tolerance Patient tolerated treatment well ?  ?Patient Left with call bell/phone within reach;in chair;with chair alarm  set;Other (comment) (lift pad in place) ?  ?Nurse Communication Mobility status;Need for lift equipment ?  ? ?   ? ?Time: 2637-8588 ?OT Time Calculation (min): 44 min ? ?Charges: OT General Charges ?$OT Visit:

## 2021-06-03 NOTE — TOC Progression Note (Signed)
Transition of Care (TOC) - Progression Note  ? ? ?Patient Details  ?Name: Fred [name] ?MRN: [id number] ?Date of Birth: January 25, 1936 ? ?Transition of Care (TOC) CM/SW Contact  ?Shirely Toren Renold Don, LCSWA ?Phone Number: ?06/03/2021, 1:16 PM ? ?Clinical Narrative:    ?CSW spoke with pt [facility] CSW who shared information about pt benefits and emailed the [facility] checklist which CSW filled out and faxed in for review tomorrow. VACSW explained that pt could qualify for SNF or LTC but not both. CSW spoke with [name] and provided a list of those [place] approved facilities. Fred [name] will follow back up with CSW with a facility choice and CSW will follow up with that facility for bed availability.  ? ? ?Expected Discharge Plan: [place] ?Barriers to Discharge: Continued Medical Work up, Ship broker, SNF Pending bed offer ? ?Expected Discharge Plan and Services ?Expected Discharge Plan: [place] ?  ?  ?  ?  ?                ?  ?  ?  ?  ?  ?  ?  ?  ?  ?  ? ? ?Social Determinants of Health (SDOH) Interventions ?  ? ?Readmission Risk Interventions ?   ? View : No data to display.  ?  ?  ?  ? ? ?

## 2021-06-03 NOTE — Progress Notes (Signed)
?PROGRESS NOTE ? ? ? ?Fred [name]  ZOX:[id number] DOB: 07-24-1935 DOA: 05/27/2021 ?PCP: [facility] ? ? ?Brief Narrative:  ?86 year old male with history of type 2 diabetes on insulin, history of stroke with resultant left-sided hemiparesis, hypertension, history of CABG, history of seizure disorder, wheelchair-bound, hyperlipidemia, chronic diastolic heart failure presents to the ER today with worsening fatigue. ?  ?Patient was recently admitted to [facility] from May 20, 2021 through May 23, 2021. Admitted there for asthma/bronchitis.   ? ?Patient lives in a house next door to his family, he has adult senior sitters during the day and family is around at night for any needs.  He typically is able to transfer from bed to motorized scooter or chair with only minimal assist for "balance guarding" per his daughter.  More recently he is having worsening fatigue and weakness requiring 2-3 people and max assist for even simple transfers which is not his baseline.  He continues to have multiple falls while attempting to transfer over the past week due to worsening weakness. ?  ?Assessment & Plan: ?  ?Principal Problem: ?  Elevated troponin ?Active Problems: ?  AKI (acute kidney injury) ([place]) ?  Hyperlipidemia ?  Hypertension associated with diabetes ([place]) ?  S/P CABG (coronary artery bypass graft) ?  Type 2 diabetes mellitus with complication, with long-term current use of insulin ([place]) ?  Hemiparesis affecting left side as late effect of cerebrovascular accident [facility]) ?  Chronic diastolic (congestive) heart failure (HCC) ?  Seizure disorder ([place]) ?  DNR (do not resuscitate)/DNI(Do Not intubate) ? ?Elevated troponin ?Cardiology consulted ?Echo with normal EF and no regional wall motion abnormalities ?No further evaluation or testing per cardiology  ?  ?AKI (acute kidney injury) ([place]) ?Avoid nephrotoxic meds ?Resolving with IV fluids and increase p.o. intake ?  ?Ambulatory dysfunction  acute on chronic  ?Patient able to transfer with only balance guarding and minimal assist at baseline, now requiring 2-3 people and max assist for simple transfers to his motorized chair ? ?Hypophosphatemia- Repleted ?Hypokalemia-repleted, within normal limits  ? ?Seizure disorder ([place]) ?Continue Depakote  ?  ?Cough, nonproductive ?Continue supportive care, nebs, flutter, incentive spirometry ?Chest x-ray unremarkable -remains afebrile without leukocytosis ?  ?S/P CABG (coronary artery bypass graft) ?Continue statin, Zetia, Plavix and aspirin  ?  ?Chronic diastolic (congestive) heart failure (HCC) ?Euvolemic -not in acute exacerbation ?  ?Hemiparesis affecting left side as late effect of cerebrovascular accident [facility]) ?Chronic left upper and lower extremity weakness, right side dominant hand ?Continue aspirin and Plavix ?  ?Type 2 diabetes mellitus with complication, with long-term current use of insulin ([place]) ?Hold jardiance given AKI. Continue with SSI and lantus. ?  ?Hypertension associated with diabetes ([place]) ?Borderline bradycardia in the 50s, decrease home metoprolol to 25 twice daily ?  ?Hyperlipidemia ?Zetia and statin ?   ?DVT prophylaxis: Lovenox ?Code Status: DNR ?Family Communication: Daughter updated ? ?Status is: Inpatient ? ?Dispo: The patient is from: Home ?             Anticipated d/c is to: SNF ?             Anticipated d/c date is: 24 to 48 hours ?             Patient currently is medically stable for discharge ? ?Consultants:  ?Cardiology ? ?Procedures:  ?None ? ?Antimicrobials:  ?None ? ?Subjective: ?No acute issues or events overnight ? ?Objective: ?Vitals:  ? 06/02/21 0335 06/02/21 1056 06/02/21 1921 06/03/21  0400  ?BP: (!) 110/44 (!) 111/45 (!) 108/47 (!) 116/43  ?Pulse: (!) 58 62 72 64  ?Resp: '17 16 20 18  '$ ?Temp: (!) 97.5 ?F (36.4 ?C) 98.7 ?F (37.1 ?C) 98.9 ?F (37.2 ?C) 98.5 ?F (36.9 ?C)  ?TempSrc: Oral Oral Oral Oral  ?SpO2: 96% 96% 95% 97%  ?Weight: 80 kg   79.9 kg  ?Height:       ? ? ?Intake/Output Summary (Last 24 hours) at 06/03/2021 0751 ?Last data filed at 06/03/2021 0403 ?Gross per 24 hour  ?Intake 240 ml  ?Output 1250 ml  ?Net -1010 ml  ? ? ?Filed Weights  ? 06/01/21 0335 06/02/21 0335 06/03/21 0400  ?Weight: 80 kg 80 kg 79.9 kg  ? ? ?Examination: ? ?General:  Pleasantly resting in bed, No acute distress. ?HEENT:  Normocephalic atraumatic.  Sclerae nonicteric, noninjected.  Extraocular movements intact bilaterally. ?Neck:  Without mass or deformity.  Trachea is midline. ?Lungs:  Clear to auscultate bilaterally without rhonchi, wheeze, or rales. ?Heart:  Regular rate and rhythm.  Without murmurs, rubs, or gallops. ?Abdomen:  Soft, nontender, nondistended.  Without guarding or rebound. ?Extremities: Left upper lower extremity weakness, chronic ?Vascular:  Dorsalis pedis and posterior tibial pulses palpable bilaterally. ? ?Data Reviewed: I have personally reviewed following labs and imaging studies ? ?CBC: ?Recent Labs  ?Lab 05/27/21 ?2006 05/28/21 ?0330 05/29/21 ?0946  ?WBC 11.6* 13.9* 8.4  ?NEUTROABS 10.1* 11.2*  --   ?HGB 14.9 14.3 13.1  ?HCT 45.3 42.4 38.2*  ?MCV 96.2 92.8 92.9  ?PLT 273 242 191  ? ? ?Basic Metabolic Panel: ?Recent Labs  ?Lab 05/27/21 ?2006 05/28/21 ?0330 05/29/21 ?6237 05/31/21 ?6283 06/01/21 ?0340  ?NA 137 138 139  --  140  ?K 4.8 4.5 3.4* 3.1* 3.8  3.8  ?CL 102 100 103  --  106  ?CO2 18* 21* 26  --  24  ?GLUCOSE 213* 193* 118*  --  143*  ?BUN 43* 44* 31*  --  12  ?CREATININE 1.43* 1.42* 1.16  --  1.00  ?CALCIUM 8.7* 8.4* 8.3*  --  8.3*  ?MG 2.6* 2.5*  --   --   --   ?PHOS  --   --   --   --  1.9*  ? ? ?GFR: ?Estimated Creatinine Clearance: 51.4 mL/min (by C-G formula based on SCr of 1 mg/dL). ?Liver Function Tests: ?Recent Labs  ?Lab 05/28/21 ?0330 06/01/21 ?0340  ?AST 18  --   ?ALT 21  --   ?ALKPHOS 39  --   ?BILITOT 1.2  --   ?PROT 5.6*  --   ?ALBUMIN 2.4* 2.1*  ? ? ?No results for input(s): LIPASE, AMYLASE in the last 168 hours. ?No results for input(s): AMMONIA  in the last 168 hours. ?Coagulation Profile: ?No results for input(s): INR, PROTIME in the last 168 hours. ?Cardiac Enzymes: ?Recent Labs  ?Lab 05/28/21 ?0330  ?CKTOTAL 60  ? ? ?BNP (last 3 results) ?No results for input(s): PROBNP in the last 8760 hours. ?HbA1C: ?No results for input(s): HGBA1C in the last 72 hours. ?CBG: ?Recent Labs  ?Lab 06/02/21 ?0626 06/02/21 ?1112 06/02/21 ?1617 06/02/21 ?2130 06/03/21 ?1517  ?GLUCAP 103* 89 194* 194* 176*  ? ? ?Lipid Profile: ?No results for input(s): CHOL, HDL, LDLCALC, TRIG, CHOLHDL, LDLDIRECT in the last 72 hours. ?Thyroid Function Tests: ?No results for input(s): TSH, T4TOTAL, FREET4, T3FREE, THYROIDAB in the last 72 hours. ?Anemia Panel: ?No results for input(s): VITAMINB12, FOLATE, FERRITIN, TIBC, IRON, RETICCTPCT in the  last 72 hours. ?Sepsis Labs: ?No results for input(s): PROCALCITON, LATICACIDVEN in the last 168 hours. ? ?No results found for this or any previous visit (from the past 240 hour(s)).  ? ? ? ? ? ?Radiology Studies: ?DG CHEST PORT 1 VIEW ? ?Result Date: 06/01/2021 ?CLINICAL DATA:  Cough EXAM: PORTABLE CHEST 1 VIEW COMPARISON:  Chest radiograph 05/27/2021 FINDINGS: Median sternotomy wires and mediastinal surgical clips are unchanged. The heart is mildly enlarged, unchanged. The upper mediastinal contours are stable. There is calcified atherosclerotic plaque of the aortic arch. Linear opacities in the left lung base likely reflect subsegmental atelectasis. Otherwise, there is no focal consolidation. There is no pulmonary edema. There is no pleural effusion or pneumothorax. There is no acute osseous abnormality. IMPRESSION: Probable subsegmental atelectasis in the left lung base. Otherwise, no focal consolidation or pleural effusion. Electronically Signed   By: Valetta Mole M.D.   On: 06/01/2021 12:20   ? ? ? ? ? ?Scheduled Meds: ? allopurinol  300 mg Oral Daily  ? aspirin EC  81 mg Oral Daily  ? atorvastatin  80 mg Oral Daily  ? clopidogrel  75 mg Oral  Daily  ? divalproex  500 mg Oral QHS  ? doxepin  10 mg Oral QHS  ? enoxaparin (LOVENOX) injection  40 mg Subcutaneous Q24H  ? escitalopram  10 mg Oral Daily  ? ezetimibe  10 mg Oral Daily  ? guaiFENesin  600 mg Oral

## 2021-06-03 NOTE — Progress Notes (Signed)
Physical Therapy Treatment ?Patient Details ?Name: Fred [name] ?MRN: [id number] ?DOB: 1935-11-12 ?Today's Date: 06/03/2021 ? ? ?History of Present Illness 86 year old male with history of type 2 diabetes on insulin, history of stroke with resultant left-sided hemiparesis, hypertension, history of CABG, history of seizure disorder, wheelchair-bound, hyperlipidemia, chronic diastolic heart failure presents to the with continued fatigue. ? ?  ?PT Comments  ? ? The pt was agreeable to session with focus on continued attempts at OOB mobility. He continues to require maxA of 2 for attempts to stand and to complete multiple squat-pivots to transfer bed-recliner. The pt was able to complete a total of x5 attempts for sit-stand, requires bilateral blocking of knees and maxA at trunk and to facilitate hip lift. The pt also requires significant assist to maintain static sitting EOB, and therefore, continue to recommend SNF for continued rehab after d/c. If returning home would need 24/7 assist of +2 as well as DME as noted below.  ?   ?Recommendations for follow up therapy are one component of a multi-disciplinary discharge planning process, led by the attending physician.  Recommendations may be updated based on patient status, additional functional criteria and insurance authorization. ? ?Follow Up Recommendations ? Skilled nursing-short term rehab (<3 hours/day) ?  ?  ?Assistance Recommended at Discharge Frequent or constant Supervision/Assistance  ?Patient can return home with the following Two people to help with walking and/or transfers;A lot of help with bathing/dressing/bathroom ?  ?Equipment Recommendations ? Hospital bed;Other (comment) (hoyer lift)  ?  ?Recommendations for Other Services   ? ? ?  ?Precautions / Restrictions Precautions ?Precautions: Fall ?Precaution Comments: L hemiparesis (baseline) ?Restrictions ?Weight Bearing Restrictions: No  ?  ? ?Mobility ? Bed Mobility ?Overal bed mobility: Needs  Assistance ?Bed Mobility: Rolling, Supine to Sit ?Rolling: Mod assist ?  ?Supine to sit: Mod assist, +2 for safety/equipment, HOB elevated ?  ?  ?General bed mobility comments: Cues for initiating with RLE, using bed rail to assist, and also pushing hips fwd with RUE, mod A +2 safety to bring final hips fwd with use of pad, good initiation of movements with cues for sequencing. ?  ? ?Transfers ?Overall transfer level: Needs assistance ?Equipment used: 2 person hand held assist ?Transfers: Bed to chair/wheelchair/BSC, Sit to/from Stand ?Sit to Stand: Max assist, +2 physical assistance, +2 safety/equipment (use of lift pad and rocking to facilitae) ?  ?  ?Squat pivot transfers: Max assist, +2 physical assistance, +2 safety/equipment ?  ?  ?General transfer comment: able to perform partial stand with LLE knee blocked, bed pad lift support cues for rocking and sequencing and +2 max A. - using similiar cueing squat pivot x4 with drop arm recliner from slightly elevated bed to recliner. ?  ? ?Ambulation/Gait ?  ?  ?  ?  ?  ?  ?  ?General Gait Details: pt uable to achieve full stand, unable to balance or step ? ? ? ? ?  ?Balance Overall balance assessment: Needs assistance ?Sitting-balance support: Single extremity supported, Feet supported ?Sitting balance-Leahy Scale: Poor ?Sitting balance - Comments: left lateral and posterior lean, requiring close min guard-modA ?Postural control: Posterior lean, Left lateral lean ?Standing balance support: Bilateral upper extremity supported ?Standing balance-Leahy Scale: Zero ?Standing balance comment: requires LLE blocked for attempts at standing, dependent on therapist assist for balance in squat position - does not come to full upright ?  ?  ?  ?  ?  ?  ?  ?  ?  ?  ?  ?  ? ?  ?  Cognition Arousal/Alertness: Awake/alert ?Behavior During Therapy: [facility] for tasks assessed/performed ?Overall Cognitive Status: History of cognitive impairments - at baseline ?  ?  ?  ?  ?  ?  ?  ?  ?  ?  ?   ?  ?  ?  ?  ?  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  ?  ? ?  ?   ?General Comments General comments (skin integrity, edema, etc.): VSS during session ?  ?  ? ?Pertinent Vitals/Pain Pain Assessment ?Pain Assessment: No/denies pain  ? ? ? ?PT Goals (current goals can now be found in the care plan section) Acute Rehab PT Goals ?Patient Stated Goal: to return home ?PT Goal Formulation: With patient ?Time For Goal Achievement: 06/15/21 ?Potential to Achieve Goals: Fair ?Progress towards PT goals: Progressing toward goals ? ?  ?Frequency ? ? ? Min 2X/week ? ? ? ?  ?PT Plan Current plan remains appropriate  ? ? ?Co-evaluation PT/OT/SLP Co-Evaluation/Treatment: Yes ?Reason for Co-Treatment: For patient/therapist safety;To address functional/ADL transfers ?PT goals addressed during session: Mobility/safety with mobility;Balance ?  ?  ? ?  ?AM-PAC PT "6 Clicks" Mobility   ?Outcome Measure ? Help needed turning from your back to your side while in a flat bed without using bedrails?: A Lot ?Help needed moving from lying on your back to sitting on the side of a flat bed without using bedrails?: Total ?Help needed moving to and from a bed to a chair (including a wheelchair)?: Total ?Help needed standing up from a chair using your arms (e.g., wheelchair or bedside chair)?: Total ?Help needed to walk in hospital room?: Total ?Help needed climbing 3-5 steps with a railing? : Total ?6 Click Score: 7 ? ?  ?End of Session Equipment Utilized During Treatment: Gait belt ?Activity Tolerance: Patient tolerated treatment well ?Patient left: in chair;with call bell/phone within reach;with chair alarm set ?Nurse Communication: Mobility status;Need for lift equipment ?PT Visit Diagnosis: Muscle weakness (generalized) (M62.81);Other abnormalities of gait and mobility (R26.89) ?  ? ? ?Time: [id number] ?PT Time Calculation (min) (ACUTE ONLY): 53 min ? ?Charges:   $Therapeutic Activity: 8-22 mins          ?          ? ?[place] Carbo, PT, DPT  ? ?Acute Rehabilitation Department ?Pager #: [phone] - [phone] ? ? ?Sandra Cockayne ?06/03/2021, 11:25 AM ? ?

## 2021-06-04 DIAGNOSIS — R778 Other specified abnormalities of plasma proteins: Secondary | ICD-10-CM | POA: Diagnosis not present

## 2021-06-04 LAB — GLUCOSE, CAPILLARY
Glucose-Capillary: 190 mg/dL — ABNORMAL HIGH (ref 70–99)
Glucose-Capillary: 173 mg/dL — ABNORMAL HIGH (ref 70–99)
Glucose-Capillary: 227 mg/dL — ABNORMAL HIGH (ref 70–99)
Glucose-Capillary: 265 mg/dL — ABNORMAL HIGH (ref 70–99)

## 2021-06-04 MED ORDER — GERHARDT'S BUTT CREAM
TOPICAL_CREAM | Freq: Three times a day (TID) | CUTANEOUS | Status: DC
  Administered 2021-06-05 – 2021-06-10 (×2): 1 via TOPICAL
  Filled 2021-06-04 (×2): qty 1

## 2021-06-04 NOTE — TOC Progression Note (Signed)
Transition of Care (TOC) - Progression Note  ? ? ?Patient Details  ?Name: Fred [name] ?MRN: [id number] ?Date of Birth: Sep 17, 1935 ? ?Transition of Care (TOC) CM/SW Contact  ?Shawan Corella Renold Don, LCSWA ?Phone Number: ?06/04/2021, 8:56 AM ? ?Clinical Narrative:    ?CSW contacted [facility] for LTC availability. CSW provided the necessary information and was told after pt is reviewed admissions would contact CSW possibly on Monday with possible bed offer. ? ? ?Expected Discharge Plan: [place] ?Barriers to Discharge: Continued Medical Work up, Ship broker, SNF Pending bed offer ? ?Expected Discharge Plan and Services ?Expected Discharge Plan: [place] ?  ?  ?  ?  ?                ?  ?  ?  ?  ?  ?  ?  ?  ?  ?  ? ? ?Social Determinants of Health (SDOH) Interventions ?  ? ?Readmission Risk Interventions ?   ? View : No data to display.  ?  ?  ?  ? ? ?

## 2021-06-04 NOTE — Progress Notes (Signed)
?PROGRESS NOTE ? ? ? ?Fred [name]  MHD:[id number] DOB: September 05, 1935 DOA: 05/27/2021 ?[facility]P: [facility] ? ? ?Brief Narrative:  ?86 year old male with history of type 2 diabetes on insulin, history of stroke with resultant left-sided hemiparesis, hypertension, history of CABG, history of seizure disorder, wheelchair-bound, hyperlipidemia, chronic diastolic heart failure presents to the ER today with worsening fatigue. ?  ?Patient was recently admitted to [facility] from May 20, 2021 through May 23, 2021. Admitted there for asthma/bronchitis.   ? ?Patient lives in a house next door to his family, he has adult senior sitters during the day and family is around at night for any needs.  He typically is able to transfer from bed to motorized scooter or chair with only minimal assist for "balance guarding" per his daughter.  More recently he is having worsening fatigue and weakness requiring 2-3 people and max assist for even simple transfers which is not his baseline.  He continues to have multiple falls while attempting to transfer over the past week due to worsening weakness. ?  ?Assessment & Plan: ?  ?Principal Problem: ?  Elevated troponin ?Active Problems: ?  AKI (acute kidney injury) ([place]) ?  Hyperlipidemia ?  Hypertension associated with diabetes ([place]) ?  S/P CABG (coronary artery bypass graft) ?  Type 2 diabetes mellitus with complication, with long-term current use of insulin ([place]) ?  Hemiparesis affecting left side as late effect of cerebrovascular accident [facility]) ?  Chronic diastolic (congestive) heart failure (HCC) ?  Seizure disorder ([place]) ?  DNR (do not resuscitate)/DNI(Do Not intubate) ? ?Elevated troponin ?Cardiology consulted ?Echo with normal EF and no regional wall motion abnormalities ?No further evaluation or testing per cardiology  ?  ?AKI (acute kidney injury) ([place]) ?Avoid nephrotoxic meds ?Resolving with IV fluids and increase p.o. intake ?  ?Ambulatory dysfunction  acute on chronic  ?Patient able to transfer with only balance guarding and minimal assist at baseline, now requiring 2-3 people and max assist for simple transfers to his motorized chair ? ?Hypophosphatemia- Repleted ?Hypokalemia-repleted, within normal limits  ? ?Seizure disorder ([place]) ?Continue Depakote  ?  ?Cough, nonproductive ?Continue supportive care, nebs, flutter, incentive spirometry ?Chest x-ray unremarkable -remains afebrile without leukocytosis ?  ?S/P CABG (coronary artery bypass graft) ?Continue statin, Zetia, Plavix and aspirin  ?  ?Chronic diastolic (congestive) heart failure (HCC) ?Euvolemic -not in acute exacerbation ?  ?Hemiparesis affecting left side as late effect of cerebrovascular accident [facility]) ?Chronic left upper and lower extremity weakness, right side dominant hand ?Continue aspirin and Plavix ?  ?Type 2 diabetes mellitus with complication, with long-term current use of insulin ([place]) ?Hold jardiance given AKI. Continue with SSI and lantus. ?  ?Hypertension associated with diabetes ([place]) ?Borderline bradycardia in the 50s, decrease home metoprolol to 25 twice daily ?  ?Hyperlipidemia ?Zetia and statin ?   ?DVT prophylaxis: Lovenox ?Code Status: DNR ?Family Communication: Daughter updated ? ?Status is: Inpatient ? ?Dispo: The patient is from: Home ?             Anticipated d/c is to: SNF ?             Anticipated d/c date is: 24 to 48 hours ?             Patient currently is medically stable for discharge ? ?Consultants:  ?Cardiology ? ?Procedures:  ?None ? ?Antimicrobials:  ?None ? ?Subjective: ?No acute issues or events overnight ? ?Objective: ?Vitals:  ? 06/03/21 1037 06/03/21 2007 06/03/21 2144 06/04/21  0406  ?BP: (!) 107/52 (!) 126/50 (!) 120/47 (!) 102/57  ?Pulse: 64 72 69 71  ?Resp: '18 19  17  '$ ?Temp: 98 ?F (36.7 ?C) 98.1 ?F (36.7 ?C)  99.1 ?F (37.3 ?C)  ?TempSrc: Oral Oral  Oral  ?SpO2: 97% 98%  95%  ?Weight:    79.1 kg  ?Height:      ? ? ?Intake/Output Summary (Last 24 hours) at  06/04/2021 0742 ?Last data filed at 06/04/2021 0420 ?Gross per 24 hour  ?Intake 580 ml  ?Output 1500 ml  ?Net -920 ml  ? ? ?Filed Weights  ? 06/02/21 0335 06/03/21 0400 06/04/21 0406  ?Weight: 80 kg 79.9 kg 79.1 kg  ? ? ?Examination: ? ?General:  Pleasantly resting in bed, No acute distress. ?HEENT:  Normocephalic atraumatic.  Sclerae nonicteric, noninjected.  Extraocular movements intact bilaterally. ?Neck:  Without mass or deformity.  Trachea is midline. ?Lungs:  Clear to auscultate bilaterally without rhonchi, wheeze, or rales. ?Heart:  Regular rate and rhythm.  Without murmurs, rubs, or gallops. ?Abdomen:  Soft, nontender, nondistended.  Without guarding or rebound. ?Extremities: Left upper lower extremity weakness, chronic ?Vascular:  Dorsalis pedis and posterior tibial pulses palpable bilaterally. ? ?Data Reviewed: I have personally reviewed following labs and imaging studies ? ?CBC: ?Recent Labs  ?Lab 05/29/21 ?0946  ?WBC 8.4  ?HGB 13.1  ?HCT 38.2*  ?MCV 92.9  ?PLT 191  ? ? ?Basic Metabolic Panel: ?Recent Labs  ?Lab 05/29/21 ?7062 05/31/21 ?3762 06/01/21 ?0340  ?NA 139  --  140  ?K 3.4* 3.1* 3.8  3.8  ?CL 103  --  106  ?CO2 26  --  24  ?GLUCOSE 118*  --  143*  ?BUN 31*  --  12  ?CREATININE 1.16  --  1.00  ?CALCIUM 8.3*  --  8.3*  ?PHOS  --   --  1.9*  ? ? ?GFR: ?Estimated Creatinine Clearance: 51.2 mL/min (by C-G formula based on SCr of 1 mg/dL). ?Liver Function Tests: ?Recent Labs  ?Lab 06/01/21 ?0340  ?ALBUMIN 2.1*  ? ? ?No results for input(s): LIPASE, AMYLASE in the last 168 hours. ?No results for input(s): AMMONIA in the last 168 hours. ?Coagulation Profile: ?No results for input(s): INR, PROTIME in the last 168 hours. ?Cardiac Enzymes: ?No results for input(s): CKTOTAL, CKMB, CKMBINDEX, TROPONINI in the last 168 hours. ? ?BNP (last 3 results) ?No results for input(s): PROBNP in the last 8760 hours. ?HbA1C: ?No results for input(s): HGBA1C in the last 72 hours. ?CBG: ?Recent Labs  ?Lab 06/03/21 ?8315  06/03/21 ?1038 06/03/21 ?1530 06/03/21 ?2135 06/04/21 ?1761  ?GLUCAP 176* 127* 182* 214* 190*  ? ? ?Lipid Profile: ?No results for input(s): CHOL, HDL, LDLCALC, TRIG, CHOLHDL, LDLDIRECT in the last 72 hours. ?Thyroid Function Tests: ?No results for input(s): TSH, T4TOTAL, FREET4, T3FREE, THYROIDAB in the last 72 hours. ?Anemia Panel: ?No results for input(s): VITAMINB12, FOLATE, FERRITIN, TIBC, IRON, RETICCTPCT in the last 72 hours. ?Sepsis Labs: ?No results for input(s): PROCALCITON, LATICACIDVEN in the last 168 hours. ? ?No results found for this or any previous visit (from the past 240 hour(s)).  ? ? ? ? ? ?Radiology Studies: ?No results found. ? ? ? ? ? ?Scheduled Meds: ? allopurinol  300 mg Oral Daily  ? aspirin EC  81 mg Oral Daily  ? atorvastatin  80 mg Oral Daily  ? clopidogrel  75 mg Oral Daily  ? divalproex  500 mg Oral QHS  ? doxepin  10 mg Oral QHS  ?  enoxaparin (LOVENOX) injection  40 mg Subcutaneous Q24H  ? escitalopram  10 mg Oral Daily  ? ezetimibe  10 mg Oral Daily  ? Gerhardt's butt cream   Topical TID  ? guaiFENesin  600 mg Oral BID  ? insulin aspart  0-15 Units Subcutaneous TID WC  ? insulin aspart  0-5 Units Subcutaneous QHS  ? insulin glargine  20 Units Subcutaneous QHS  ? metoprolol tartrate  25 mg Oral BID  ? niacin  500 mg Oral QHS  ? polyethylene glycol  17 g Oral BID  ? senna-docusate  1 tablet Oral Daily  ? tamsulosin  0.4 mg Oral QPC supper  ? ? LOS: 7 days  ? ?Time spent: 85mn ? ?WLittle Ishikawa DO ?[facility] ? ?If 7PM-7AM, please contact night-coverage ?[email or web address] ? ?06/04/2021, 7:42 AM   ? ? ? ?

## 2021-06-05 DIAGNOSIS — R778 Other specified abnormalities of plasma proteins: Secondary | ICD-10-CM | POA: Diagnosis not present

## 2021-06-05 LAB — GLUCOSE, CAPILLARY
Glucose-Capillary: 267 mg/dL — ABNORMAL HIGH (ref 70–99)
Glucose-Capillary: 132 mg/dL — ABNORMAL HIGH (ref 70–99)
Glucose-Capillary: 131 mg/dL — ABNORMAL HIGH (ref 70–99)
Glucose-Capillary: 226 mg/dL — ABNORMAL HIGH (ref 70–99)

## 2021-06-05 NOTE — Progress Notes (Signed)
?PROGRESS NOTE ? ? ? ?Fred [name]  YWV:[id number] DOB: 03-15-1935 DOA: 05/27/2021 ?PCP: [facility] ? ? ?Brief Narrative:  ?86 year old male with history of type 2 diabetes on insulin, history of stroke with resultant left-sided hemiparesis, hypertension, history of CABG, history of seizure disorder, wheelchair-bound, hyperlipidemia, chronic diastolic heart failure presents to the ER today with worsening fatigue. ?  ?Patient was recently admitted to [facility] from May 20, 2021 through May 23, 2021. Admitted there for asthma/bronchitis.   ? ?Patient lives in a house next door to his family, he has adult senior sitters during the day and family is around at night for any needs.  He typically is able to transfer from bed to motorized scooter or chair with only minimal assist for "balance guarding" per his daughter.  More recently he is having worsening fatigue and weakness requiring 2-3 people and max assist for even simple transfers which is not his baseline.  He continues to have multiple falls while attempting to transfer over the past week due to worsening weakness. ?  ?Assessment & Plan: ?  ?Principal Problem: ?  Elevated troponin ?Active Problems: ?  AKI (acute kidney injury) ([place]) ?  Hyperlipidemia ?  Hypertension associated with diabetes ([place]) ?  S/P CABG (coronary artery bypass graft) ?  Type 2 diabetes mellitus with complication, with long-term current use of insulin ([place]) ?  Hemiparesis affecting left side as late effect of cerebrovascular accident [facility]) ?  Chronic diastolic (congestive) heart failure (HCC) ?  Seizure disorder ([place]) ?  DNR (do not resuscitate)/DNI(Do Not intubate) ? ?Elevated troponin ?Cardiology consulted ?Echo with normal EF and no regional wall motion abnormalities ?No further evaluation or testing per cardiology  ?  ?AKI (acute kidney injury) ([place]) ?Avoid nephrotoxic meds ?Resolving with IV fluids and increase p.o. intake ?  ?Ambulatory dysfunction  acute on chronic  ?Patient able to transfer with only balance guarding and minimal assist at baseline, now requiring 2-3 people and max assist for simple transfers to his motorized chair ? ?Hypophosphatemia- Repleted ?Hypokalemia-repleted, within normal limits  ? ?Seizure disorder ([place]) ?Continue Depakote  ?  ?Cough, nonproductive ?Continue supportive care, nebs, flutter, incentive spirometry ?Chest x-ray unremarkable -remains afebrile without leukocytosis ?  ?S/P CABG (coronary artery bypass graft) ?Continue statin, Zetia, Plavix and aspirin  ?  ?Chronic diastolic (congestive) heart failure (HCC) ?Euvolemic -not in acute exacerbation ?  ?Hemiparesis affecting left side as late effect of cerebrovascular accident [facility]) ?Chronic left upper and lower extremity weakness, right side dominant hand ?Continue aspirin and Plavix ?  ?Type 2 diabetes mellitus with complication, with long-term current use of insulin ([place]) ?Hold jardiance given AKI. Continue with SSI and lantus. ?  ?Hypertension associated with diabetes ([place]) ?Borderline bradycardia in the 50s, decrease home metoprolol to 25 twice daily ?  ?Hyperlipidemia ?Zetia and statin ?   ?DVT prophylaxis: Lovenox ?Code Status: DNR ?Family Communication: Daughter updated ? ?Status is: Inpatient ? ?Dispo: The patient is from: Home ?             Anticipated d/c is to: SNF ?             Anticipated d/c date is: 24 to 48 hours ?             Patient currently is medically stable for discharge ? ?Consultants:  ?Cardiology ? ?Procedures:  ?None ? ?Antimicrobials:  ?None ? ?Subjective: ?No acute issues or events overnight ? ?Objective: ?Vitals:  ? 06/04/21 0935 06/04/21 1120 06/04/21 1920 06/05/21  0254  ?BP: (!) 118/49 (!) 107/53 (!) 121/52 (!) 123/56  ?Pulse: 63 64 78 66  ?Resp:  '15 19 15  '$ ?Temp:  98.5 ?F (36.9 ?C) 97.8 ?F (36.6 ?C) 97.6 ?F (36.4 ?C)  ?TempSrc:  Oral Oral Oral  ?SpO2:  95% 95% 93%  ?Weight:    80.2 kg  ?Height:      ? ? ?Intake/Output Summary (Last 24 hours) at  06/05/2021 0732 ?Last data filed at 06/05/2021 0301 ?Gross per 24 hour  ?Intake 780 ml  ?Output 900 ml  ?Net -120 ml  ? ? ?Filed Weights  ? 06/03/21 0400 06/04/21 0406 06/05/21 0254  ?Weight: 79.9 kg 79.1 kg 80.2 kg  ? ? ?Examination: ? ?General:  Pleasantly resting in bed, No acute distress. ?HEENT:  Normocephalic atraumatic.  Sclerae nonicteric, noninjected.  Extraocular movements intact bilaterally. ?Neck:  Without mass or deformity.  Trachea is midline. ?Lungs:  Clear to auscultate bilaterally without rhonchi, wheeze, or rales. ?Heart:  Regular rate and rhythm.  Without murmurs, rubs, or gallops. ?Abdomen:  Soft, nontender, nondistended.  Without guarding or rebound. ?Extremities: Left upper lower extremity weakness, chronic ?Vascular:  Dorsalis pedis and posterior tibial pulses palpable bilaterally. ? ?Data Reviewed: I have personally reviewed following labs and imaging studies ? ?CBC: ?Recent Labs  ?Lab 05/29/21 ?0946  ?WBC 8.4  ?HGB 13.1  ?HCT 38.2*  ?MCV 92.9  ?PLT 191  ? ? ?Basic Metabolic Panel: ?Recent Labs  ?Lab 05/29/21 ?7893 05/31/21 ?8101 06/01/21 ?0340  ?NA 139  --  140  ?K 3.4* 3.1* 3.8  3.8  ?CL 103  --  106  ?CO2 26  --  24  ?GLUCOSE 118*  --  143*  ?BUN 31*  --  12  ?CREATININE 1.16  --  1.00  ?CALCIUM 8.3*  --  8.3*  ?PHOS  --   --  1.9*  ? ? ?GFR: ?Estimated Creatinine Clearance: 51.5 mL/min (by C-G formula based on SCr of 1 mg/dL). ?Liver Function Tests: ?Recent Labs  ?Lab 06/01/21 ?0340  ?ALBUMIN 2.1*  ? ? ?No results for input(s): LIPASE, AMYLASE in the last 168 hours. ?No results for input(s): AMMONIA in the last 168 hours. ?Coagulation Profile: ?No results for input(s): INR, PROTIME in the last 168 hours. ?Cardiac Enzymes: ?No results for input(s): CKTOTAL, CKMB, CKMBINDEX, TROPONINI in the last 168 hours. ? ?BNP (last 3 results) ?No results for input(s): PROBNP in the last 8760 hours. ?HbA1C: ?No results for input(s): HGBA1C in the last 72 hours. ?CBG: ?Recent Labs  ?Lab 06/04/21 ?7510  06/04/21 ?1119 06/04/21 ?1619 06/04/21 ?2113 06/05/21 ?0602  ?GLUCAP 190* 173* 227* 265* 267*  ? ? ?Lipid Profile: ?No results for input(s): CHOL, HDL, LDLCALC, TRIG, CHOLHDL, LDLDIRECT in the last 72 hours. ?Thyroid Function Tests: ?No results for input(s): TSH, T4TOTAL, FREET4, T3FREE, THYROIDAB in the last 72 hours. ?Anemia Panel: ?No results for input(s): VITAMINB12, FOLATE, FERRITIN, TIBC, IRON, RETICCTPCT in the last 72 hours. ?Sepsis Labs: ?No results for input(s): PROCALCITON, LATICACIDVEN in the last 168 hours. ? ?No results found for this or any previous visit (from the past 240 hour(s)).  ? ? ? ? ? ?Radiology Studies: ?No results found. ? ? ? ? ? ?Scheduled Meds: ? allopurinol  300 mg Oral Daily  ? aspirin EC  81 mg Oral Daily  ? atorvastatin  80 mg Oral Daily  ? clopidogrel  75 mg Oral Daily  ? divalproex  500 mg Oral QHS  ? doxepin  10 mg Oral QHS  ?  enoxaparin (LOVENOX) injection  40 mg Subcutaneous Q24H  ? escitalopram  10 mg Oral Daily  ? ezetimibe  10 mg Oral Daily  ? Gerhardt's butt cream   Topical TID  ? guaiFENesin  600 mg Oral BID  ? insulin aspart  0-15 Units Subcutaneous TID WC  ? insulin aspart  0-5 Units Subcutaneous QHS  ? insulin glargine  20 Units Subcutaneous QHS  ? metoprolol tartrate  25 mg Oral BID  ? niacin  500 mg Oral QHS  ? polyethylene glycol  17 g Oral BID  ? senna-docusate  1 tablet Oral Daily  ? tamsulosin  0.4 mg Oral QPC supper  ? ? LOS: 8 days  ? ?Time spent: 75mn ? ?WLittle Ishikawa DO ?[facility] ? ?If 7PM-7AM, please contact night-coverage ?[email or web address] ? ?06/05/2021, 7:32 AM   ? ? ? ?

## 2021-06-06 DIAGNOSIS — R778 Other specified abnormalities of plasma proteins: Secondary | ICD-10-CM | POA: Diagnosis not present

## 2021-06-06 LAB — GLUCOSE, CAPILLARY
Glucose-Capillary: 132 mg/dL — ABNORMAL HIGH (ref 70–99)
Glucose-Capillary: 143 mg/dL — ABNORMAL HIGH (ref 70–99)
Glucose-Capillary: 153 mg/dL — ABNORMAL HIGH (ref 70–99)
Glucose-Capillary: 194 mg/dL — ABNORMAL HIGH (ref 70–99)

## 2021-06-06 MED ORDER — INSULIN GLARGINE-YFGN 100 UNIT/ML ~~LOC~~ SOLN
20.0000 [IU] | Freq: Every day | SUBCUTANEOUS | Status: DC
  Administered 2021-06-06 – 2021-06-08 (×3): 20 [IU] via SUBCUTANEOUS
  Filled 2021-06-06 (×4): qty 0.2

## 2021-06-06 NOTE — Progress Notes (Signed)
?PROGRESS NOTE ? ? ? ?Fred [name]  YQI:[id number] DOB: 22-May-1935 DOA: 05/27/2021 ?PCP: [facility] ? ? ?Brief Narrative:  ?86 year old male with history of type 2 diabetes on insulin, history of stroke with resultant left-sided hemiparesis, hypertension, history of CABG, history of seizure disorder, wheelchair-bound, hyperlipidemia, chronic diastolic heart failure presents to the ER today with worsening fatigue. ?  ?Patient was recently admitted to [facility] from May 20, 2021 through May 23, 2021. Admitted there for asthma/bronchitis.   ? ?Patient lives in a house next door to his family, he has adult senior sitters during the day and family is around at night for any needs.  He typically is able to transfer from bed to motorized scooter or chair with only minimal assist for "balance guarding" per his daughter.  More recently he is having worsening fatigue and weakness requiring 2-3 people and max assist for even simple transfers which is not his baseline.  He continues to have multiple falls while attempting to transfer over the past week due to worsening weakness. ?  ?Assessment & Plan: ?  ?Principal Problem: ?  Elevated troponin ?Active Problems: ?  AKI (acute kidney injury) ([place]) ?  Hyperlipidemia ?  Hypertension associated with diabetes ([place]) ?  S/P CABG (coronary artery bypass graft) ?  Type 2 diabetes mellitus with complication, with long-term current use of insulin ([place]) ?  Hemiparesis affecting left side as late effect of cerebrovascular accident [facility]) ?  Chronic diastolic (congestive) heart failure (HCC) ?  Seizure disorder ([place]) ?  DNR (do not resuscitate)/DNI(Do Not intubate) ? ?Elevated troponin ?Cardiology consulted ?Echo with normal EF and no regional wall motion abnormalities ?No further evaluation or testing per cardiology  ?  ?AKI (acute kidney injury) ([place]) ?Avoid nephrotoxic meds ?Resolving with IV fluids and increase p.o. intake ?  ?Ambulatory dysfunction  acute on chronic  ?Patient able to transfer with only balance guarding and minimal assist at baseline, now requiring 2-3 people and max assist for simple transfers to his motorized chair ? ?Hypophosphatemia- Repleted ?Hypokalemia-repleted, within normal limits  ? ?Seizure disorder ([place]) ?Continue Depakote  ?  ?Cough, nonproductive ?Continue supportive care, nebs, flutter, incentive spirometry ?Chest x-ray unremarkable -remains afebrile without leukocytosis ?  ?S/P CABG (coronary artery bypass graft) ?Continue statin, Zetia, Plavix and aspirin  ?  ?Chronic diastolic (congestive) heart failure (HCC) ?Euvolemic -not in acute exacerbation ?  ?Hemiparesis affecting left side as late effect of cerebrovascular accident [facility]) ?Chronic left upper and lower extremity weakness, right side dominant hand ?Continue aspirin and Plavix ?  ?Type 2 diabetes mellitus with complication, with long-term current use of insulin ([place]) ?Hold jardiance given AKI. Continue with SSI and lantus. ?  ?Hypertension associated with diabetes ([place]) ?Borderline bradycardia in the 50s, decrease home metoprolol to 25 twice daily ?  ?Hyperlipidemia ?Zetia and statin ?   ?DVT prophylaxis: Lovenox ?Code Status: DNR ?Family Communication: Daughter called - no answer ? ?Status is: Inpatient ? ?Dispo: The patient is from: Home ?             Anticipated d/c is to: SNF ?             Anticipated d/c date is: 24 to 48 hours ?             Patient currently is medically stable for discharge ? ?Consultants:  ?Cardiology ? ?Procedures:  ?None ? ?Antimicrobials:  ?None ? ?Subjective: ?No acute issues or events overnight ? ?Objective: ?Vitals:  ? 06/05/21 1044 06/05/21 1916  06/06/21 0308 06/06/21 0313  ?BP: (!) 93/45 (!) 117/52 (!) 122/52   ?Pulse: (!) 58 71 (!) 58   ?Resp: '17 17 17   '$ ?Temp: 97.9 ?F (36.6 ?C) 98.3 ?F (36.8 ?C) 98.2 ?F (36.8 ?C)   ?TempSrc: Oral Oral Oral   ?SpO2: 94% 95% 97%   ?Weight:    80.2 kg  ?Height:      ? ? ?Intake/Output Summary (Last 24 hours)  at 06/06/2021 0731 ?Last data filed at 06/06/2021 3220 ?Gross per 24 hour  ?Intake 1140 ml  ?Output 575 ml  ?Net 565 ml  ? ? ?Filed Weights  ? 06/04/21 0406 06/05/21 0254 06/06/21 0313  ?Weight: 79.1 kg 80.2 kg 80.2 kg  ? ? ?Examination: ? ?General:  Pleasantly resting in bed, No acute distress. ?HEENT:  Normocephalic atraumatic.  Sclerae nonicteric, noninjected.  Extraocular movements intact bilaterally. ?Neck:  Without mass or deformity.  Trachea is midline. ?Lungs:  Clear to auscultate bilaterally without rhonchi, wheeze, or rales. ?Heart:  Regular rate and rhythm.  Without murmurs, rubs, or gallops. ?Abdomen:  Soft, nontender, nondistended.  Without guarding or rebound. ?Extremities: Left upper lower extremity weakness 3/5 ? ? ?Data Reviewed: I have personally reviewed following labs and imaging studies ? ?CBC: ?No results for input(s): WBC, NEUTROABS, HGB, HCT, MCV, PLT in the last 168 hours. ? ?Basic Metabolic Panel: ?Recent Labs  ?Lab 05/31/21 ?0832 06/01/21 ?0340  ?NA  --  140  ?K 3.1* 3.8  3.8  ?CL  --  106  ?CO2  --  24  ?GLUCOSE  --  143*  ?BUN  --  12  ?CREATININE  --  1.00  ?CALCIUM  --  8.3*  ?PHOS  --  1.9*  ? ? ?GFR: ?Estimated Creatinine Clearance: 51.5 mL/min (by C-G formula based on SCr of 1 mg/dL). ?Liver Function Tests: ?Recent Labs  ?Lab 06/01/21 ?0340  ?ALBUMIN 2.1*  ? ? ?No results for input(s): LIPASE, AMYLASE in the last 168 hours. ?No results for input(s): AMMONIA in the last 168 hours. ?Coagulation Profile: ?No results for input(s): INR, PROTIME in the last 168 hours. ?Cardiac Enzymes: ?No results for input(s): CKTOTAL, CKMB, CKMBINDEX, TROPONINI in the last 168 hours. ? ?BNP (last 3 results) ?No results for input(s): PROBNP in the last 8760 hours. ?HbA1C: ?No results for input(s): HGBA1C in the last 72 hours. ?CBG: ?Recent Labs  ?Lab 06/05/21 ?0602 06/05/21 ?1132 06/05/21 ?1704 06/05/21 ?2109 06/06/21 ?2542  ?GLUCAP 267* 132* 131* 226* 132*  ? ? ?Lipid Profile: ?No results for input(s):  CHOL, HDL, LDLCALC, TRIG, CHOLHDL, LDLDIRECT in the last 72 hours. ?Thyroid Function Tests: ?No results for input(s): TSH, T4TOTAL, FREET4, T3FREE, THYROIDAB in the last 72 hours. ?Anemia Panel: ?No results for input(s): VITAMINB12, FOLATE, FERRITIN, TIBC, IRON, RETICCTPCT in the last 72 hours. ?Sepsis Labs: ?No results for input(s): PROCALCITON, LATICACIDVEN in the last 168 hours. ? ?No results found for this or any previous visit (from the past 240 hour(s)).  ? ?Radiology Studies: ?No results found. ? ?Scheduled Meds: ? allopurinol  300 mg Oral Daily  ? aspirin EC  81 mg Oral Daily  ? atorvastatin  80 mg Oral Daily  ? clopidogrel  75 mg Oral Daily  ? divalproex  500 mg Oral QHS  ? doxepin  10 mg Oral QHS  ? enoxaparin (LOVENOX) injection  40 mg Subcutaneous Q24H  ? escitalopram  10 mg Oral Daily  ? ezetimibe  10 mg Oral Daily  ? Gerhardt's butt cream   Topical  TID  ? guaiFENesin  600 mg Oral BID  ? insulin aspart  0-15 Units Subcutaneous TID WC  ? insulin aspart  0-5 Units Subcutaneous QHS  ? insulin glargine  20 Units Subcutaneous QHS  ? metoprolol tartrate  25 mg Oral BID  ? niacin  500 mg Oral QHS  ? polyethylene glycol  17 g Oral BID  ? senna-docusate  1 tablet Oral Daily  ? tamsulosin  0.4 mg Oral QPC supper  ? ? LOS: 9 days  ? ?Time spent: 94mn ? ?WLittle Ishikawa DO ?[facility] ? ?If 7PM-7AM, please contact night-coverage ?[email or web address] ? ?06/06/2021, 7:31 AM   ? ? ? ?

## 2021-06-07 LAB — GLUCOSE, CAPILLARY
Glucose-Capillary: 153 mg/dL — ABNORMAL HIGH (ref 70–99)
Glucose-Capillary: 147 mg/dL — ABNORMAL HIGH (ref 70–99)
Glucose-Capillary: 208 mg/dL — ABNORMAL HIGH (ref 70–99)
Glucose-Capillary: 309 mg/dL — ABNORMAL HIGH (ref 70–99)

## 2021-06-07 NOTE — Plan of Care (Signed)

## 2021-06-07 NOTE — TOC Progression Note (Signed)
Transition of Care (TOC) - Progression Note  ? ? ?Patient Details  ?Name: Fred [name] ?MRN: [id number] ?Date of Birth: [birth year] ? ?Transition of Care (TOC) CM/SW Contact  ?Coralee Pesa, LCSWA ?Phone Number: ?06/07/2021, 2:52 PM ? ?Clinical Narrative:    ?CSW noted that [facility] had not responded yet, CSW called and requested to speak with admissions. CSW was told that someone would follow up when available. TOC will continue to follow for DC needs. ? ? ?Expected Discharge Plan: [place] ?Barriers to Discharge: Continued Medical Work up, Ship broker, SNF Pending bed offer ? ?Expected Discharge Plan and Services ?Expected Discharge Plan: [place] ?  ?  ?  ?  ?                ?  ?  ?  ?  ?  ?  ?  ?  ?  ?  ? ? ?Social Determinants of Health (SDOH) Interventions ?  ? ?Readmission Risk Interventions ?   ? View : No data to display.  ?  ?  ?  ? ? ?

## 2021-06-07 NOTE — TOC Progression Note (Signed)
Transition of Care (TOC) - Progression Note  ? ? ?Patient Details  ?Name: Fred [name] ?MRN: [id number] ?Date of Birth: 1935-09-14 ? ?Transition of Care (TOC) CM/SW Contact  ?Wayden Schwertner Renold Don, LCSWA ?Phone Number: ?06/07/2021, 12:42 PM ? ?Clinical Narrative:    ?CSW spoke with pt daughter who suggested [name] as an alternative to [facility]. CSW has not yet heard form that facility but followed up with [name] who states they don't have a contract with the [place]. CSW will follow up with [facility] on bed offer. ? ? ?Expected Discharge Plan: [place] ?Barriers to Discharge: Continued Medical Work up, Ship broker, SNF Pending bed offer ? ?Expected Discharge Plan and Services ?Expected Discharge Plan: [place] ?  ?  ?  ?  ?                ?  ?  ?  ?  ?  ?  ?  ?  ?  ?  ? ? ?Social Determinants of Health (SDOH) Interventions ?  ? ?Readmission Risk Interventions ?   ? View : No data to display.  ?  ?  ?  ? ? ?

## 2021-06-07 NOTE — Progress Notes (Signed)
Physical Therapy Treatment ?Patient Details ?Name: Fred [name] ?MRN: [id number] ?DOB: [birth year] ?Today's Date: 06/07/2021 ? ? ?History of Present Illness 86 year old male with history of type 2 diabetes on insulin, history of stroke with resultant left-sided hemiparesis, hypertension, history of CABG, history of seizure disorder, wheelchair-bound, hyperlipidemia, chronic diastolic heart failure presents to the with continued fatigue. ? ?  ?PT Comments  ? ? The pt was seen for continued progression of OOB mobility and LE strengthening. He was able to complete x5 sit-stand in total from EOB and recliner with improved hip clearance and upright positioning. He continues to require maxA and facilitation at hips to power up to standing as well as blocking BLE and verbal cues for posture. The pt was able to progress standing duration to 10 sec max, will continue to benefit from skilled PT acutely to progress LE strength and capacity for transfers with reduced caregiver assistance. Continue to recommend SNF rehab prior to return home with family assist.  ?  ?Recommendations for follow up therapy are one component of a multi-disciplinary discharge planning process, led by the attending physician.  Recommendations may be updated based on patient status, additional functional criteria and insurance authorization. ? ?Follow Up Recommendations ? Skilled nursing-short term rehab (<3 hours/day) ?  ?  ?Assistance Recommended at Discharge Frequent or constant Supervision/Assistance  ?Patient can return home with the following Two people to help with walking and/or transfers;A lot of help with bathing/dressing/bathroom ?  ?Equipment Recommendations ? Hospital bed;Other (comment) (hoyer lift)  ?  ?Recommendations for Other Services   ? ? ?  ?Precautions / Restrictions Precautions ?Precautions: Fall ?Precaution Comments: L hemiparesis (baseline) ?Restrictions ?Weight Bearing Restrictions: No  ?  ? ?Mobility ? Bed Mobility ?Overal  bed mobility: Needs Assistance ?Bed Mobility: Rolling, Supine to Sit, Sidelying to Sit ?Rolling: Mod assist ?Sidelying to sit: Mod assist, HOB elevated ?Supine to sit: Mod assist, +2 for safety/equipment, HOB elevated ?  ?  ?General bed mobility comments: initially modA of 2, progressed to modA of 1 from sidelying. strong L lean initially but pt able to steady with single UE support ?  ? ?Transfers ?Overall transfer level: Needs assistance ?Equipment used: 2 person hand held assist ?Transfers: Bed to chair/wheelchair/BSC, Sit to/from Stand ?Sit to Stand: Max assist, +2 physical assistance, +2 safety/equipment (use of lift pad and rocking to facilitate) ?  ?  ?Squat pivot transfers: Max assist, +2 physical assistance, +2 safety/equipment ?  ?  ?General transfer comment: pt with improved ability to stand from EOB. maxA of 2 with facilitation at hips but improved clearance and able to maintain 3-10 seconds with continued assist. blocking bilateral knees. cues at hips and for posture. squat pivot x3 to transfer to recliner ?  ? ?Ambulation/Gait ?  ?  ?  ?  ?  ?  ?  ?General Gait Details: pt uable to achieve full stand, unable to balance or step ? ? ? ? ?  ?Balance Overall balance assessment: Needs assistance ?Sitting-balance support: Single extremity supported, Feet supported ?Sitting balance-Leahy Scale: Poor ?Sitting balance - Comments: left lateral and posterior lean, requiring close min guard-modA ?Postural control: Posterior lean, Left lateral lean ?Standing balance support: Bilateral upper extremity supported ?Standing balance-Leahy Scale: Zero ?Standing balance comment: requires LLE blocked for attempts at standing, dependent on therapist assist for balance in squat position - improved upright position but remains with some hip and trunk flexion ?  ?  ?  ?  ?  ?  ?  ?  ?  ?  ?  ?  ? ?  ?  Cognition Arousal/Alertness: Awake/alert ?Behavior During Therapy: [facility] for tasks assessed/performed ?Overall Cognitive Status:  History of cognitive impairments - at baseline ?  ?  ?  ?  ?  ?  ?  ?  ?  ?  ?  ?  ?  ?  ?  ?  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  ?  ? ?  ?Exercises Other Exercises ?Other Exercises: sit-stand from slightly elevated EOB x 4 maxA of 2 with facilitation at hips. progressivley longer stands up to 10 sec ? ?  ?General Comments General comments (skin integrity, edema, etc.): VSS on RA ?  ?  ? ?Pertinent Vitals/Pain Pain Assessment ?Pain Assessment: No/denies pain  ? ? ? ?PT Goals (current goals can now be found in the care plan section) Acute Rehab PT Goals ?Patient Stated Goal: to return home ?PT Goal Formulation: With patient ?Time For Goal Achievement: 06/15/21 ?Potential to Achieve Goals: Fair ?Progress towards PT goals: Progressing toward goals ? ?  ?Frequency ? ? ? Min 2X/week ? ? ? ?  ?PT Plan Current plan remains appropriate  ? ? ?Co-evaluation PT/OT/SLP Co-Evaluation/Treatment: Yes ?Reason for Co-Treatment: For patient/therapist safety;To address functional/ADL transfers ?PT goals addressed during session: Mobility/safety with mobility;Balance;Strengthening/ROM ?  ?  ? ?  ?AM-PAC PT "6 Clicks" Mobility   ?Outcome Measure ? Help needed turning from your back to your side while in a flat bed without using bedrails?: A Lot ?Help needed moving from lying on your back to sitting on the side of a flat bed without using bedrails?: Total ?Help needed moving to and from a bed to a chair (including a wheelchair)?: Total ?Help needed standing up from a chair using your arms (e.g., wheelchair or bedside chair)?: Total ?Help needed to walk in hospital room?: Total ?Help needed climbing 3-5 steps with a railing? : Total ?6 Click Score: 7 ? ?  ?End of Session Equipment Utilized During Treatment: Gait belt ?Activity Tolerance: Patient tolerated treatment well ?Patient left: in chair;with call bell/phone within reach;with chair alarm set;with  family/visitor present ?Nurse Communication: Mobility status;Need for lift equipment ?PT Visit Diagnosis: Muscle weakness (generalized) (M62.81);Other abnormalities of gait and mobility (R26.89) ?  ? ? ?Time: 1848-5927 ?PT Time Calculation (min) (ACUTE ONLY): 28 min ? ?Charges:  $Therapeutic Exercise: 8-22 mins          ?          ? ?West Carbo, PT, DPT  ? ?Acute Rehabilitation Department ?Pager #: [phone] - [phone] ? ? ?Sandra Cockayne ?06/07/2021, 10:35 AM ? ?

## 2021-06-07 NOTE — Plan of Care (Signed)
  Problem: Health Behavior/Discharge Planning: Goal: Ability to manage health-related needs will improve Outcome: Progressing   Problem: Education: Goal: Knowledge of General Education information will improve Description: Including pain rating scale, medication(s)/side effects and non-pharmacologic comfort measures Outcome: Progressing   

## 2021-06-07 NOTE — Progress Notes (Signed)
Occupational Therapy Treatment ?Patient Details ?Name: Fred [name] ?MRN: [id number] ?DOB: 1936/02/10 ?Today's Date: 06/07/2021 ? ? ?History of present illness 86 year old male with history of type 2 diabetes on insulin, history of stroke with resultant left-sided hemiparesis, hypertension, history of CABG, history of seizure disorder, wheelchair-bound, hyperlipidemia, chronic diastolic heart failure presents to the with continued fatigue. ?  ?OT comments ? Patient continues to make steady progress towards goals in skilled OT session. Patient's session encompassed  sit<>stands x5 and practicing transferring to recliner to increase overall independence. Patient seen in conjunction with PT as patient requires skilled services due to CVA and current deficits (lift pad placed in chair for dependent transfer back to bed). Patient with increased initiation on R when attempting sit<>stands, however remains max Ax2 in order to come into upright position and heavy tactile cues at the hips to attempt engaging core to come fully into standing. Discharge remains appropriate, OT to continue to follow.   ? ?Recommendations for follow up therapy are one component of a multi-disciplinary discharge planning process, led by the attending physician.  Recommendations may be updated based on patient status, additional functional criteria and insurance authorization. ?   ?Follow Up Recommendations ? Skilled nursing-short term rehab (<3 hours/day)  ?  ?Assistance Recommended at Discharge Frequent or constant Supervision/Assistance  ?Patient can return home with the following ? Two people to help with walking and/or transfers;Two people to help with bathing/dressing/bathroom;Direct supervision/assist for medications management;Direct supervision/assist for financial management;Assist for transportation;Assistance with cooking/housework;Help with stairs or ramp for entrance ?  ?Equipment Recommendations ? Hospital bed;Other  (comment);Wheelchair cushion (measurements OT);Wheelchair (measurements OT)  ?  ?Recommendations for Other Services   ? ?  ?Precautions / Restrictions Precautions ?Precautions: Fall ?Precaution Comments: L hemiparesis (baseline) ?Restrictions ?Weight Bearing Restrictions: No  ? ? ?  ? ?Mobility Bed Mobility ?Overal bed mobility: Needs Assistance ?Bed Mobility: Rolling, Supine to Sit, Sidelying to Sit ?Rolling: Mod assist ?Sidelying to sit: Mod assist, HOB elevated ?Supine to sit: Mod assist, +2 for safety/equipment, HOB elevated ?  ?  ?General bed mobility comments: initially modA of 2, progressed to modA of 1 from sidelying. strong L lean initially but pt able to steady with single UE support ?  ? ?Transfers ?Overall transfer level: Needs assistance ?Equipment used: 2 person hand held assist ?Transfers: Bed to chair/wheelchair/BSC, Sit to/from Stand ?Sit to Stand: Max assist, +2 physical assistance, +2 safety/equipment (use of lift pad and rocking to facilitate) ?  ?Squat pivot transfers: Max assist, +2 physical assistance, +2 safety/equipment ?  ?  ?  ?General transfer comment: pt with improved ability to stand from EOB. maxA of 2 with facilitation at hips but improved clearance and able to maintain 3-10 seconds with continued assist. blocking bilateral knees. cues at hips and for posture. squat pivot x3 to transfer to recliner ?  ?  ?Balance Overall balance assessment: Needs assistance ?Sitting-balance support: Single extremity supported, Feet supported ?Sitting balance-Leahy Scale: Poor ?Sitting balance - Comments: left lateral and posterior lean, requiring close min guard-modA ?Postural control: Posterior lean, Left lateral lean ?Standing balance support: Bilateral upper extremity supported ?Standing balance-Leahy Scale: Zero ?Standing balance comment: requires LLE blocked for attempts at standing, dependent on therapist assist for balance in squat position - improved upright position but remains with some hip  and trunk flexion ?  ?  ?  ?  ?  ?  ?  ?  ?  ?  ?  ?   ? ?ADL either performed or  assessed with clinical judgement  ? ?ADL Overall ADL's : Needs assistance/impaired ?Eating/Feeding: Minimal assistance;Sitting ?  ?Grooming: Wash/dry hands;Wash/dry face;Minimal assistance;Sitting ?  ?  ?  ?  ?  ?  ?  ?  ?  ?Toilet Transfer: Maximal assistance;+2 for physical assistance;+2 for safety/equipment;Squat-pivot;BSC/3in1 ?Toilet Transfer Details (indicate cue type and reason): simulated through recliner transfer, squat pivot x4 with bed pad as scoop ?Toileting- Clothing Manipulation and Hygiene: Maximal assistance;+2 for physical assistance;+2 for safety/equipment ?  ?  ?  ?Functional mobility during ADLs: Maximal assistance;+2 for physical assistance;+2 for safety/equipment ?General ADL Comments: continuing to progress, but remains a heavy +2 assist despite increased initiation on R ?  ? ?Extremity/Trunk Assessment   ?  ?  ?  ?  ?  ? ?Vision   ?  ?  ?Perception   ?  ?Praxis   ?  ? ?Cognition Arousal/Alertness: Awake/alert ?Behavior During Therapy: [facility] for tasks assessed/performed ?Overall Cognitive Status: History of cognitive impairments - at baseline ?  ?  ?  ?  ?  ?  ?  ?  ?  ?  ?  ?  ?  ?  ?  ?  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  ?  ?   ?Exercises Exercises: Other exercises ?Other Exercises ?Other Exercises: sit-stand from slightly elevated EOB x 4 maxA of 2 with facilitation at hips. progressivley longer stands up to 10 sec ? ?  ?Shoulder Instructions   ? ? ?  ?General Comments VSS on RA  ? ? ?Pertinent Vitals/ Pain       Pain Assessment ?Pain Assessment: No/denies pain ? ?Home Living   ?  ?  ?  ?  ?  ?  ?  ?  ?  ?  ?  ?  ?  ?  ?  ?  ?  ?  ? ?  ?Prior Functioning/Environment    ?  ?  ?  ?   ? ?Frequency ? Min 2X/week  ? ? ? ? ?  ?Progress Toward Goals ? ?OT Goals(current goals can now be found in the care plan section) ? Progress  towards OT goals: Progressing toward goals ? ?Acute Rehab OT Goals ?Patient Stated Goal: to spend time with my baby (great grand-daughter present in room) ?OT Goal Formulation: With family ?Time For Goal Achievement: 06/11/21 ?Potential to Achieve Goals: Good  ?Plan Discharge plan remains appropriate   ? ?Co-evaluation ? ? ? PT/OT/SLP Co-Evaluation/Treatment: Yes ?Reason for Co-Treatment: For patient/therapist safety;To address functional/ADL transfers ?PT goals addressed during session: Mobility/safety with mobility;Balance;Strengthening/ROM ?OT goals addressed during session: ADL's and self-care;Strengthening/ROM ?  ? ?  ?AM-PAC OT "6 Clicks" Daily Activity     ?Outcome Measure ? ? Help from another person eating meals?: A Little ?Help from another person taking care of personal grooming?: A Lot ?Help from another person toileting, which includes using toliet, bedpan, or urinal?: Total ?Help from another person bathing (including washing, rinsing, drying)?: A Lot ?Help from another person to put on and taking off regular upper body clothing?: A Lot ?Help from another person to put on and taking off regular lower body clothing?: Total ?6 Click Score: 11 ? ?  ?End of Session Equipment Utilized During Treatment: Gait belt ? ?OT Visit Diagnosis: Muscle weakness (generalized) (M62.81);Hemiplegia and hemiparesis;Other symptoms and signs involving cognitive function ?Hemiplegia - Right/Left: Left ?Hemiplegia - dominant/non-dominant: Non-Dominant ?Hemiplegia - caused by: Cerebral  infarction ?  ?Activity Tolerance Patient tolerated treatment well ?  ?Patient Left with call bell/phone within reach;in chair;with chair alarm set;Other (comment) (lift pad in place) ?  ?Nurse Communication Mobility status;Need for lift equipment ?  ? ?   ? ?Time: 7342-8768 ?OT Time Calculation (min): 28 min ? ?Charges: OT General Charges ?$OT Visit: 1 Visit ?OT Treatments ?$Self Care/Home Management : 8-22 mins ? ?Corinne Ports E. Tinlee Navarrette,  OTR/L ?Acute Rehabilitation Services ?[phone] ?[phone]  ? ?Corinne Ports Ned Kakar ?06/07/2021, 12:47 PM ?

## 2021-06-07 NOTE — Progress Notes (Signed)
?PROGRESS NOTE ? ? ? ?Fred [name]  TWS:[id number] DOB: 1935-09-14 DOA: 05/27/2021 ?[facility] ? ? ?Brief Narrative:  ?86 year old male with history of type 2 diabetes on insulin, history of stroke with resultant left-sided hemiparesis, hypertension, history of CABG, history of seizure disorder, wheelchair-bound, hyperlipidemia, chronic diastolic heart failure presents to the ER today with worsening fatigue. ?  ?Patient was recently admitted to [facility] from May 20, 2021 through May 23, 2021. Admitted there for asthma/bronchitis.   ? ?Patient lives in a house next door to his family, he has adult senior sitters during the day and family is around at night for any needs.  He typically is able to transfer from bed to motorized scooter or chair with only minimal assist for "balance guarding" per his daughter.  More recently he is having worsening fatigue and weakness requiring 2-3 people and max assist for even simple transfers which is not his baseline.  He continues to have multiple falls while attempting to transfer over the past week due to worsening weakness. ?  ?Assessment & Plan: ?  ?Principal Problem: ?  Elevated troponin ?Active Problems: ?  AKI (acute kidney injury) ([place]) ?  Hyperlipidemia ?  Hypertension associated with diabetes ([place]) ?  S/P CABG (coronary artery bypass graft) ?  Type 2 diabetes mellitus with complication, with long-term current use of insulin ([place]) ?  Hemiparesis affecting left side as late effect of cerebrovascular accident [facility]) ?  Chronic diastolic (congestive) heart failure (HCC) ?  Seizure disorder ([place]) ?  DNR (do not resuscitate)/DNI(Do Not intubate) ? ?  ?Ambulatory dysfunction acute on chronic  ?Patient able to transfer with only balance guarding and minimal assist at baseline, now requiring 2-3 people and max assist for simple transfers to his motorized chair ?Awaiting SNF placement/insurance approval (initially denied due to  miscommunication he was at baseline requiring 3+ assist which is not accurate). ? ?Elevated troponin, POA, resolved ?Cardiology consulted ?Echo with normal EF and no regional wall motion abnormalities ?No further evaluation or testing per cardiology  ?  ?AKI (acute kidney injury) ([place]) ?Avoid nephrotoxic meds ?Resolving with IV fluids and increase p.o. intake ? ?Hypophosphatemia- Repleted ?Hypokalemia-repleted, within normal limits  ? ?Seizure disorder ([place]) ?Continue Depakote  ?  ?Cough, nonproductive ?Continue supportive care, nebs, flutter, incentive spirometry ?Chest x-ray unremarkable -remains afebrile without leukocytosis ?  ?S/P CABG (coronary artery bypass graft) ?Continue statin, Zetia, Plavix and aspirin  ?  ?Chronic diastolic (congestive) heart failure (HCC) ?Euvolemic -not in acute exacerbation ?  ?Hemiparesis affecting left side as late effect of cerebrovascular accident [facility]) ?Chronic left upper and lower extremity weakness, right side dominant hand ?Continue aspirin and Plavix ?  ?Type 2 diabetes mellitus with complication, with long-term current use of insulin ([place]) ?Hold jardiance given AKI. Continue with SSI and lantus. ?  ?Hypertension associated with diabetes ([place]) ?Borderline bradycardia in the 50s, decrease home metoprolol to 25 twice daily ?  ?Hyperlipidemia ?Zetia and statin ?   ?DVT prophylaxis: Lovenox ?Code Status: DNR ?Family Communication: Granddaughter at bedside ? ?Status is: Inpatient ? ?Dispo: The patient is from: Home ?             Anticipated d/c is to: SNF ?             Anticipated d/c date is: Imminent ?             Patient currently is medically stable for discharge ? ?Consultants:  ?Cardiology ? ?Procedures:  ?None ? ?Antimicrobials:  ?  None ? ?Subjective: ?No acute issues or events overnight ? ?Objective: ?Vitals:  ? 06/06/21 1926 06/06/21 2201 06/07/21 6433 06/07/21 0325  ?BP: (!) 123/49 (!) 129/48 (!) 118/57   ?Pulse: 65 66 (!) 59   ?Resp: 16  15   ?Temp: 98.2 ?F (36.8 ?C)   97.9 ?F (36.6 ?C) 97.9 ?F (36.6 ?C)  ?TempSrc: Oral  Oral Oral  ?SpO2: 98%  97%   ?Weight:   82.2 kg   ?Height:      ? ? ?Intake/Output Summary (Last 24 hours) at 06/07/2021 0748 ?Last data filed at 06/07/2021 2951 ?Gross per 24 hour  ?Intake 720 ml  ?Output 1050 ml  ?Net -330 ml  ? ? ?Filed Weights  ? 06/05/21 0254 06/06/21 0313 06/07/21 0322  ?Weight: 80.2 kg 80.2 kg 82.2 kg  ? ? ?Examination: ? ?General:  Pleasantly resting in bed, No acute distress. ?HEENT:  Normocephalic atraumatic.  Sclerae nonicteric, noninjected.  Extraocular movements intact bilaterally. ?Neck:  Without mass or deformity.  Trachea is midline. ?Lungs:  Clear to auscultate bilaterally without rhonchi, wheeze, or rales. ?Heart:  Regular rate and rhythm.  Without murmurs, rubs, or gallops. ?Abdomen:  Soft, nontender, nondistended.  Without guarding or rebound. ?Extremities: Left upper/lower extremity weakness 3/5 ? ? ?Data Reviewed: I have personally reviewed following labs and imaging studies ? ?CBC: ?No results for input(s): WBC, NEUTROABS, HGB, HCT, MCV, PLT in the last 168 hours. ? ?Basic Metabolic Panel: ?Recent Labs  ?Lab 05/31/21 ?0832 06/01/21 ?0340  ?NA  --  140  ?K 3.1* 3.8  3.8  ?CL  --  106  ?CO2  --  24  ?GLUCOSE  --  143*  ?BUN  --  12  ?CREATININE  --  1.00  ?CALCIUM  --  8.3*  ?PHOS  --  1.9*  ? ? ?GFR: ?Estimated Creatinine Clearance: 52.1 mL/min (by C-G formula based on SCr of 1 mg/dL). ?Liver Function Tests: ?Recent Labs  ?Lab 06/01/21 ?0340  ?ALBUMIN 2.1*  ? ? ?No results for input(s): LIPASE, AMYLASE in the last 168 hours. ?No results for input(s): AMMONIA in the last 168 hours. ?Coagulation Profile: ?No results for input(s): INR, PROTIME in the last 168 hours. ?Cardiac Enzymes: ?No results for input(s): CKTOTAL, CKMB, CKMBINDEX, TROPONINI in the last 168 hours. ? ?BNP (last 3 results) ?No results for input(s): PROBNP in the last 8760 hours. ?HbA1C: ?No results for input(s): HGBA1C in the last 72 hours. ?CBG: ?Recent Labs   ?Lab 06/06/21 ?8841 06/06/21 ?1029 06/06/21 ?1530 06/06/21 ?2118 06/07/21 ?0603  ?GLUCAP 132* 143* 153* 194* 153*  ? ? ?Lipid Profile: ?No results for input(s): CHOL, HDL, LDLCALC, TRIG, CHOLHDL, LDLDIRECT in the last 72 hours. ?Thyroid Function Tests: ?No results for input(s): TSH, T4TOTAL, FREET4, T3FREE, THYROIDAB in the last 72 hours. ?Anemia Panel: ?No results for input(s): VITAMINB12, FOLATE, FERRITIN, TIBC, IRON, RETICCTPCT in the last 72 hours. ?Sepsis Labs: ?No results for input(s): PROCALCITON, LATICACIDVEN in the last 168 hours. ? ?No results found for this or any previous visit (from the past 240 hour(s)).  ? ?Radiology Studies: ?No results found. ? ?Scheduled Meds: ? allopurinol  300 mg Oral Daily  ? aspirin EC  81 mg Oral Daily  ? atorvastatin  80 mg Oral Daily  ? clopidogrel  75 mg Oral Daily  ? divalproex  500 mg Oral QHS  ? doxepin  10 mg Oral QHS  ? enoxaparin (LOVENOX) injection  40 mg Subcutaneous Q24H  ? escitalopram  10 mg Oral Daily  ?  ezetimibe  10 mg Oral Daily  ? Gerhardt's butt cream   Topical TID  ? guaiFENesin  600 mg Oral BID  ? insulin aspart  0-15 Units Subcutaneous TID WC  ? insulin aspart  0-5 Units Subcutaneous QHS  ? insulin glargine-yfgn  20 Units Subcutaneous QHS  ? metoprolol tartrate  25 mg Oral BID  ? niacin  500 mg Oral QHS  ? polyethylene glycol  17 g Oral BID  ? senna-docusate  1 tablet Oral Daily  ? tamsulosin  0.4 mg Oral QPC supper  ? ? LOS: 10 days  ? ?Time spent: 60mn ? ?WLittle Ishikawa DO ?[facility] ? ?If 7PM-7AM, please contact night-coverage ?[email or web address] ? ?06/07/2021, 7:48 AM   ? ? ? ?

## 2021-06-08 LAB — GLUCOSE, CAPILLARY
Glucose-Capillary: 99 mg/dL (ref 70–99)
Glucose-Capillary: 151 mg/dL — ABNORMAL HIGH (ref 70–99)
Glucose-Capillary: 264 mg/dL — ABNORMAL HIGH (ref 70–99)
Glucose-Capillary: 262 mg/dL — ABNORMAL HIGH (ref 70–99)

## 2021-06-08 NOTE — TOC Progression Note (Addendum)
Transition of Care (TOC) - Progression Note  ? ? ?Patient Details  ?Name: Fred [name] ?MRN: [id number] ?Date of Birth: 1935/10/09 ? ?Transition of Care (TOC) CM/SW Contact  ?Phong Isenberg Renold Don, LCSWA ?Phone Number: ?06/08/2021, 11:57 AM ? ?Clinical Narrative:    ?CSW spoke with pt daughter about her visiting [facility] and stating that they told her they now accept [employer] for LTC. CSW contacted Ebony Hail to confirm, [facility]ony Hail informed CSW that they were not in contract with the [place]. CSW updated the family, pt daughter provided CSW with contact information of who she spoke with Janace Hoard [phone]). CSW attempted to contact Angie, she was not available but CSW left there a message and will follow up with Angie again today.  ?CSW spoke with family in the room, since there is no luck getting a LTC facility through the [place] they are willing to get pt into a SNF in order to get strength to go home until hey find a LTC facility. The family would like to try [facility] for SNF. CSW will follow up with [facility] for bed availability.   ? ? ?Expected Discharge Plan: [place] ?Barriers to Discharge: Continued Medical Work up, Ship broker, SNF Pending bed offer ? ?Expected Discharge Plan and Services ?Expected Discharge Plan: [place] ?  ?  ?  ?  ?                ?  ?  ?  ?  ?  ?  ?  ?  ?  ?  ? ? ?Social Determinants of Health (SDOH) Interventions ?  ? ?Readmission Risk Interventions ?   ? View : No data to display.  ?  ?  ?  ? ? ?

## 2021-06-08 NOTE — Care Management Important Message (Signed)
Important Message ? ?Patient Details  ?Name: Fred [name] ?MRN: [id number] ?Date of Birth: February 24, 1936 ? ? ?Medicare Important Message Given:  Yes ? ? ? ? ?Shelda Altes ?06/08/2021, 8:32 AM ?

## 2021-06-08 NOTE — Progress Notes (Signed)
?PROGRESS NOTE ? ? ? ?Fred [name]  IRC:[id number] DOB: 1936/01/26 DOA: 05/27/2021 ?PCP: [facility] ? ? ?Brief Narrative:  ?86 year old male with history of type 2 diabetes on insulin, history of stroke with resultant left-sided hemiparesis, hypertension, history of CABG, history of seizure disorder, wheelchair-bound, hyperlipidemia, chronic diastolic heart failure presents to the ER today with worsening fatigue. ?  ?Patient was recently admitted to [facility] from May 20, 2021 through May 23, 2021. Admitted there for asthma/bronchitis.   ? ?Patient lives in a house next door to his family, he has adult senior sitters during the day and family is around at night for any needs.  He typically is able to transfer from bed to motorized scooter or chair with only minimal assist for "balance guarding" per his daughter.  More recently he is having worsening fatigue and weakness requiring 2-3 people and max assist for even simple transfers which is not his baseline.  He continues to have multiple falls while attempting to transfer over the past week due to worsening weakness. ?  ?Assessment & Plan: ?  ?Principal Problem: ?  Elevated troponin ?Active Problems: ?  AKI (acute kidney injury) ([place]) ?  Hyperlipidemia ?  Hypertension associated with diabetes ([place]) ?  S/P CABG (coronary artery bypass graft) ?  Type 2 diabetes mellitus with complication, with long-term current use of insulin ([place]) ?  Hemiparesis affecting left side as late effect of cerebrovascular accident [facility]) ?  Chronic diastolic (congestive) heart failure (HCC) ?  Seizure disorder ([place]) ?  DNR (do not resuscitate)/DNI(Do Not intubate) ? ?  ?Ambulatory dysfunction acute on chronic  ?Patient able to transfer with only balance guarding and minimal assist at baseline, now requiring 2-3 people and max assist for simple transfers to his motorized chair ?Awaiting SNF placement/insurance approval ? ?Elevated troponin, POA,  resolved ?Cardiology consulted ?Echo with normal EF and no regional wall motion abnormalities ?No further evaluation or testing per cardiology  ?  ?AKI (acute kidney injury) ([place]) ?Avoid nephrotoxic meds ?Resolving with IV fluids and increase p.o. intake ? ?Hypophosphatemia- Repleted ?Hypokalemia-repleted, within normal limits  ? ?Seizure disorder ([place]) ?Continue Depakote  ?  ?Cough, nonproductive ?Continue supportive care, nebs, flutter, incentive spirometry ?Chest x-ray unremarkable -remains afebrile without leukocytosis ?  ?S/P CABG (coronary artery bypass graft) ?Continue statin, Zetia, Plavix and aspirin  ?  ?Chronic diastolic (congestive) heart failure (HCC) ?Euvolemic -not in acute exacerbation ?  ?Hemiparesis affecting left side as late effect of cerebrovascular accident [facility]) ?Chronic left upper and lower extremity weakness, right side dominant hand ?Continue aspirin and Plavix ?  ?Type 2 diabetes mellitus with complication, with long-term current use of insulin ([place]) ?Hold jardiance given AKI. Continue with SSI and lantus. ?  ?Hypertension associated with diabetes ([place]) ?Borderline bradycardia in the 50s, decrease home metoprolol to 25 twice daily ?  ?Hyperlipidemia ?Zetia and statin ?   ?DVT prophylaxis: Lovenox ?Code Status: DNR ? ?Status is: Inpatient ? ?Dispo: The patient is from: Home ?             Anticipated d/c is to: SNF ?             Anticipated d/c date is: Imminent ?             Patient currently is medically stable for discharge ? ?Consultants:  ?Cardiology ? ?Procedures:  ?None ? ?Antimicrobials:  ?None ? ?Subjective: ?No acute issues or events overnight ? ?Objective: ?Vitals:  ? 06/07/21 1925 06/07/21 2047 06/08/21 0001 06/08/21  0350  ?BP: (!) 113/41 (!) 106/45  (!) 117/51  ?Pulse: 63 66  (!) 57  ?Resp: 18   14  ?Temp: 98.6 ?F (37 ?C)   98 ?F (36.7 ?C)  ?TempSrc: Oral   Oral  ?SpO2: 97%   99%  ?Weight:   80 kg   ?Height:      ? ? ?Intake/Output Summary (Last 24 hours) at 06/08/2021 0729 ?Last  data filed at 06/08/2021 0623 ?Gross per 24 hour  ?Intake 1160 ml  ?Output 1150 ml  ?Net 10 ml  ? ? ?Filed Weights  ? 06/06/21 0313 06/07/21 0322 06/08/21 0001  ?Weight: 80.2 kg 82.2 kg 80 kg  ? ? ?Examination: ? ?General:  Pleasantly resting in bed, No acute distress. ?HEENT:  Normocephalic atraumatic.  Sclerae nonicteric, noninjected.  Extraocular movements intact bilaterally. ?Neck:  Without mass or deformity.  Trachea is midline. ?Lungs:  Clear to auscultate bilaterally without rhonchi, wheeze, or rales. ?Heart:  Regular rate and rhythm.  Without murmurs, rubs, or gallops. ?Abdomen:  Soft, nontender, nondistended.  Without guarding or rebound. ?Extremities: Left upper/lower extremity weakness 3/5 ? ? ?Data Reviewed: I have personally reviewed following labs and imaging studies ? ?CBC: ?No results for input(s): WBC, NEUTROABS, HGB, HCT, MCV, PLT in the last 168 hours. ? ?Basic Metabolic Panel: ?No results for input(s): NA, K, CL, CO2, GLUCOSE, BUN, CREATININE, CALCIUM, MG, PHOS in the last 168 hours. ? ?GFR: ?Estimated Creatinine Clearance: 51.4 mL/min (by C-G formula based on SCr of 1 mg/dL). ?Liver Function Tests: ?No results for input(s): AST, ALT, ALKPHOS, BILITOT, PROT, ALBUMIN in the last 168 hours. ? ?No results for input(s): LIPASE, AMYLASE in the last 168 hours. ?No results for input(s): AMMONIA in the last 168 hours. ?Coagulation Profile: ?No results for input(s): INR, PROTIME in the last 168 hours. ?Cardiac Enzymes: ?No results for input(s): CKTOTAL, CKMB, CKMBINDEX, TROPONINI in the last 168 hours. ? ?BNP (last 3 results) ?No results for input(s): PROBNP in the last 8760 hours. ?HbA1C: ?No results for input(s): HGBA1C in the last 72 hours. ?CBG: ?Recent Labs  ?Lab 06/07/21 ?0603 06/07/21 ?1106 06/07/21 ?1555 06/07/21 ?2054 06/08/21 ?7628  ?GLUCAP [place]  ? ? ?Lipid Profile: ?No results for input(s): CHOL, HDL, LDLCALC, TRIG, CHOLHDL, LDLDIRECT in the last 72 hours. ?Thyroid Function  Tests: ?No results for input(s): TSH, T4TOTAL, FREET4, T3FREE, THYROIDAB in the last 72 hours. ?Anemia Panel: ?No results for input(s): VITAMINB12, FOLATE, FERRITIN, TIBC, IRON, RETICCTPCT in the last 72 hours. ?Sepsis Labs: ?No results for input(s): PROCALCITON, LATICACIDVEN in the last 168 hours. ? ?No results found for this or any previous visit (from the past 240 hour(s)).  ? ?Radiology Studies: ?No results found. ? ?Scheduled Meds: ? allopurinol  300 mg Oral Daily  ? aspirin EC  81 mg Oral Daily  ? atorvastatin  80 mg Oral Daily  ? clopidogrel  75 mg Oral Daily  ? divalproex  500 mg Oral QHS  ? doxepin  10 mg Oral QHS  ? enoxaparin (LOVENOX) injection  40 mg Subcutaneous Q24H  ? escitalopram  10 mg Oral Daily  ? ezetimibe  10 mg Oral Daily  ? Gerhardt's butt cream   Topical TID  ? guaiFENesin  600 mg Oral BID  ? insulin aspart  0-15 Units Subcutaneous TID WC  ? insulin aspart  0-5 Units Subcutaneous QHS  ? insulin glargine-yfgn  20 Units Subcutaneous QHS  ? metoprolol tartrate  25 mg Oral BID  ? niacin  500 mg Oral QHS  ? polyethylene glycol  17 g Oral BID  ? senna-docusate  1 tablet Oral Daily  ? tamsulosin  0.4 mg Oral QPC supper  ? ? LOS: 11 days  ? ?Time spent: 57mn ? ?WLittle Ishikawa DO ?[facility] ? ?If 7PM-7AM, please contact night-coverage ?[email or web address] ? ?06/08/2021, 7:29 AM   ? ? ? ?

## 2021-06-09 LAB — GLUCOSE, CAPILLARY
Glucose-Capillary: 122 mg/dL — ABNORMAL HIGH (ref 70–99)
Glucose-Capillary: 186 mg/dL — ABNORMAL HIGH (ref 70–99)
Glucose-Capillary: 172 mg/dL — ABNORMAL HIGH (ref 70–99)
Glucose-Capillary: 306 mg/dL — ABNORMAL HIGH (ref 70–99)
Glucose-Capillary: 247 mg/dL — ABNORMAL HIGH (ref 70–99)

## 2021-06-09 LAB — BASIC METABOLIC PANEL
Anion gap: 6 (ref 5–15)
BUN: 11 mg/dL (ref 8–23)
CO2: 26 mmol/L (ref 22–32)
Calcium: 8.2 mg/dL — ABNORMAL LOW (ref 8.9–10.3)
Chloride: 107 mmol/L (ref 98–111)
Creatinine, Ser: 0.79 mg/dL (ref 0.61–1.24)
GFR, Estimated: >60 mL/min (ref >60)
Glucose, Bld: 172 mg/dL — ABNORMAL HIGH (ref 70–99)
Potassium: 4.2 mmol/L (ref 3.5–5.1)
Sodium: 139 mmol/L (ref 135–145)

## 2021-06-09 MED ORDER — INSULIN GLARGINE-YFGN 100 UNIT/ML ~~LOC~~ SOLN
25.0000 [IU] | Freq: Every day | SUBCUTANEOUS | Status: DC
  Administered 2021-06-09: 25 [IU] via SUBCUTANEOUS
  Filled 2021-06-09 (×2): qty 0.25

## 2021-06-09 MED ORDER — METOPROLOL TARTRATE 12.5 MG HALF TABLET
12.5000 mg | ORAL_TABLET | Freq: Two times a day (BID) | ORAL | Status: DC
  Administered 2021-06-09 – 2021-06-10 (×2): 12.5 mg via ORAL
  Filled 2021-06-09 (×2): qty 1

## 2021-06-09 NOTE — Progress Notes (Signed)
?PROGRESS NOTE ? ? ? ?Fred [name]  SAY:[id number] DOB: [birth year] DOA: 05/27/2021 ?PCP: [facility] ? ? ?Brief Narrative:  ?86 year old male with history of type 2 diabetes on insulin, history of stroke with resultant left-sided hemiparesis, hypertension, history of CABG, history of seizure disorder, wheelchair-bound, hyperlipidemia, chronic diastolic heart failure presents to the ER today with worsening fatigue. ?  ?Patient was recently admitted to [facility] from May 20, 2021 through May 23, 2021. Admitted there for asthma/bronchitis.   ? ?Patient lives in a house next door to his family, he has adult senior sitters during the day and family is around at night for any needs.  He typically is able to transfer from bed to motorized scooter or chair with only minimal assist for "balance guarding" per his daughter.  More recently he is having worsening fatigue and weakness requiring 2-3 people and max assist for even simple transfers which is not his baseline.  He continues to have multiple falls while attempting to transfer over the past week due to worsening weakness. ?  ?Assessment & Plan: ?  ?Principal Problem: ?  Elevated troponin ?Active Problems: ?  AKI (acute kidney injury) ([place]) ?  Hyperlipidemia ?  Hypertension associated with diabetes ([place]) ?  S/P CABG (coronary artery bypass graft) ?  Type 2 diabetes mellitus with complication, with long-term current use of insulin ([place]) ?  Hemiparesis affecting left side as late effect of cerebrovascular accident [facility]) ?  Chronic diastolic (congestive) heart failure (HCC) ?  Seizure disorder ([place]) ?  DNR (do not resuscitate)/DNI(Do Not intubate) ? ?  ?Ambulatory dysfunction acute on chronic  ?Patient able to transfer with only balance guarding and minimal assist at baseline, now requiring 2-3 people and max assist for simple transfers to his motorized chair ?Awaiting SNF placement/insurance approval ? ?Elevated troponin, POA,  resolved ?Cardiology consulted ?Echo with normal EF and no regional wall motion abnormalities ?No further evaluation or testing per cardiology  ?  ?AKI (acute kidney injury) ([place]) ?Avoid nephrotoxic meds ?Resolving with IV fluids and increase p.o. intake ? ?Hypophosphatemia- Repleted ?Hypokalemia-repleted, within normal limits  ? ?Seizure disorder ([name]) ?Continue Depakote  ?  ?Cough, nonproductive ?Continue supportive care, nebs, flutter, incentive spirometry ?Chest x-ray unremarkable -remains afebrile without leukocytosis ?  ?S/P CABG (coronary artery bypass graft) ?Continue statin, Zetia, Plavix and aspirin  ?  ?Chronic diastolic (congestive) heart failure (HCC) ?Euvolemic -not in acute exacerbation ?  ?Hemiparesis affecting left side as late effect of cerebrovascular accident [facility]) ?Chronic left upper and lower extremity weakness, right side dominant hand ?Continue aspirin and Plavix ?  ?Type 2 diabetes mellitus with complication, with long-term current use of insulin ([place]) ?[place] audience has been on hold for several days due to AKI however renal function has not been checked for 9 days.  Although 9 days ago 2, renal function was normal.  We will check BMP again today and if normal, will resume Jardiance.  Patient's blood sugar still is elevated while being on Lantus 20 units.  Will increase to 25 units and continue SSI. ?  ?Hypertension associated with diabetes ([place]) ?Blood pressure fairly controlled and still with asymptomatic bradycardia despite of reducing metoprolol to 25 mg twice daily, I will further reduce to 12.5 mg twice daily. ?  ?Hyperlipidemia ?Zetia and statin ?   ?DVT prophylaxis: Lovenox ?Code Status: DNR ? ?Status is: Inpatient ? ?Dispo: The patient is from: Home ?             Anticipated d/c  is to: SNF ?             Anticipated d/c date is: Imminent ?             Patient currently is medically stable for discharge ? ?Consultants:  ?Cardiology ? ?Procedures:  ?None ? ?Antimicrobials:   ?None ? ?Subjective: ?Seen and examined.  No complaints. ? ?Objective: ?Vitals:  ? 06/08/21 1918 06/09/21 0409 06/09/21 0844 06/09/21 1050  ?BP: (!) 112/42 (!) 112/55 (!) 137/56 (!) 127/50  ?Pulse: (!) 58 60 60 60  ?Resp: '14 15 18 17  '$ ?Temp: 97.8 ?F (36.6 ?C) 97.8 ?F (36.6 ?C)  98.2 ?F (36.8 ?C)  ?TempSrc: Oral Oral  Oral  ?SpO2: 95% 96% 96% 98%  ?Weight:  81.2 kg    ?Height:      ? ? ?Intake/Output Summary (Last 24 hours) at 06/09/2021 1256 ?Last data filed at 06/09/2021 0932 ?Gross per 24 hour  ?Intake 834 ml  ?Output 1800 ml  ?Net -966 ml  ? ? ?Filed Weights  ? 06/07/21 0322 06/08/21 0001 06/09/21 0409  ?Weight: 82.2 kg 80 kg 81.2 kg  ? ? ?Examination: ? ?General exam: Appears calm and comfortable  ?Respiratory system: Clear to auscultation. Respiratory effort normal. ?Cardiovascular system: S1 & S2 heard, RRR. No JVD, murmurs, rubs, gallops or clicks. No pedal edema. ?Gastrointestinal system: Abdomen is nondistended, soft and nontender. No organomegaly or masses felt. Normal bowel sounds heard. ?Central nervous system: Alert and oriented.  Chronic lower extremity weakness power 3/5 bilateral lower extremity. ?Skin: No rashes, lesions or ulcers.  ?Psychiatry: Judgement and insight appear normal. Mood & affect appropriate.  ? ?Data Reviewed: I have personally reviewed following labs and imaging studies ? ?CBC: ?No results for input(s): WBC, NEUTROABS, HGB, HCT, MCV, PLT in the last 168 hours. ? ?Basic Metabolic Panel: ?No results for input(s): NA, K, CL, CO2, GLUCOSE, BUN, CREATININE, CALCIUM, MG, PHOS in the last 168 hours. ? ?GFR: ?Estimated Creatinine Clearance: 51.8 mL/min (by C-G formula based on SCr of 1 mg/dL). ?Liver Function Tests: ?No results for input(s): AST, ALT, ALKPHOS, BILITOT, PROT, ALBUMIN in the last 168 hours. ? ?No results for input(s): LIPASE, AMYLASE in the last 168 hours. ?No results for input(s): AMMONIA in the last 168 hours. ?Coagulation Profile: ?No results for input(s): INR, PROTIME in  the last 168 hours. ?Cardiac Enzymes: ?No results for input(s): CKTOTAL, CKMB, CKMBINDEX, TROPONINI in the last 168 hours. ? ?BNP (last 3 results) ?No results for input(s): PROBNP in the last 8760 hours. ?HbA1C: ?No results for input(s): HGBA1C in the last 72 hours. ?CBG: ?Recent Labs  ?Lab 06/08/21 ?1112 06/08/21 ?1652 06/08/21 ?2108 06/09/21 ?0606 06/09/21 ?1052  ?GLUCAP 151* 264* 262* 122* 186*  ? ? ?Lipid Profile: ?No results for input(s): CHOL, HDL, LDLCALC, TRIG, CHOLHDL, LDLDIRECT in the last 72 hours. ?Thyroid Function Tests: ?No results for input(s): TSH, T4TOTAL, FREET4, T3FREE, THYROIDAB in the last 72 hours. ?Anemia Panel: ?No results for input(s): VITAMINB12, FOLATE, FERRITIN, TIBC, IRON, RETICCTPCT in the last 72 hours. ?Sepsis Labs: ?No results for input(s): PROCALCITON, LATICACIDVEN in the last 168 hours. ? ?No results found for this or any previous visit (from the past 240 hour(s)).  ? ?Radiology Studies: ?No results found. ? ?Scheduled Meds: ? allopurinol  300 mg Oral Daily  ? aspirin EC  81 mg Oral Daily  ? atorvastatin  80 mg Oral Daily  ? clopidogrel  75 mg Oral Daily  ? divalproex  500 mg Oral QHS  ? doxepin  10 mg Oral QHS  ? enoxaparin (LOVENOX) injection  40 mg Subcutaneous Q24H  ? escitalopram  10 mg Oral Daily  ? ezetimibe  10 mg Oral Daily  ? Gerhardt's butt cream   Topical TID  ? guaiFENesin  600 mg Oral BID  ? insulin aspart  0-15 Units Subcutaneous TID WC  ? insulin aspart  0-5 Units Subcutaneous QHS  ? insulin glargine-yfgn  25 Units Subcutaneous QHS  ? metoprolol tartrate  25 mg Oral BID  ? niacin  500 mg Oral QHS  ? polyethylene glycol  17 g Oral BID  ? senna-docusate  1 tablet Oral Daily  ? tamsulosin  0.4 mg Oral QPC supper  ? ? LOS: 12 days  ? ?Time spent: 30 min ? ?Darliss Cheney, MD ?[facility] ? ?If 7PM-7AM, please contact night-coverage ?[email or web address] ? ?06/09/2021, 12:56 PM   ? ? ? ?

## 2021-06-09 NOTE — TOC Progression Note (Signed)
Transition of Care (TOC) - Progression Note  ? ? ?Patient Details  ?Name: Fred [name] ?MRN: [id number] ?Date of Birth: September 17, 1935 ? ?Transition of Care (TOC) CM/SW Contact  ?[name], LCSWA ?Phone Number: ?06/09/2021, 10:30 AM ? ?Clinical Narrative:    ?[name] is reviewing pt information of bed offer, CSW will continue to follow for bed options.  ? ? ?Expected Discharge Plan: [place] ?Barriers to Discharge: Continued Medical Work up, Ship broker, SNF Pending bed offer ? ?Expected Discharge Plan and Services ?Expected Discharge Plan: [place] ?  ?  ?  ?  ?                ?  ?  ?  ?  ?  ?  ?  ?  ?  ?  ? ? ?Social Determinants of Health (SDOH) Interventions ?  ? ?Readmission Risk Interventions ?   ? View : No data to display.  ?  ?  ?  ? ? ?

## 2021-06-10 LAB — GLUCOSE, CAPILLARY
Glucose-Capillary: 250 mg/dL — ABNORMAL HIGH (ref 70–99)
Glucose-Capillary: 188 mg/dL — ABNORMAL HIGH (ref 70–99)

## 2021-06-10 MED ORDER — METOPROLOL TARTRATE 25 MG PO TABS: 12.5000 mg | ORAL_TABLET | Freq: Two times a day (BID) | ORAL | 0 refills | Status: AC

## 2021-06-10 NOTE — Discharge Summary (Signed)
PatientPhysician Discharge Summary  ?Fred [name] PHX:[id number] DOB: 11-Aug-1935 DOA: 05/27/2021 ? ?[facility] ? ?Admit date: 05/27/2021 ?Discharge date: 06/10/2021 ?30 Day Unplanned Readmission Risk Score   ? ?Flowsheet Row ED to Hosp-Admission (Current) from 05/27/2021 in [facility]  ?30 Day Unplanned Readmission Risk Score (%) 21.4 Filed at 06/10/2021 0801  ? ?  ? ? This score is the patient's risk of an unplanned readmission within 30 days of being discharged (0 -100%). The score is based on dignosis, age, lab data, medications, orders, and past utilization.   ?Low:  0-14.9   Medium: 15-21.9   High: 22-29.9   Extreme: 30 and above ? ?  ? ?  ? ? ? ?Admitted From: Home ?Disposition: SNF ? ?Recommendations for Outpatient Follow-up:  ?Follow up with PCP in 1-2 weeks ?Please obtain BMP/CBC in one week ?Please follow up with your PCP on the following pending results: ?Unresulted Labs (From admission, onward)  ? ? None  ? ?  ?  ? ? ?Home Health: None ?Equipment/Devices: None ? ?Discharge Condition: Stable ?CODE STATUS: DNR ?Diet recommendation: Diabetic ? ?Subjective: Seen and examined.  No complaints. ? ?Brief/Interim Summary: 86 year old male with history of type 2 diabetes on insulin, history of stroke with resultant left-sided hemiparesis, hypertension, history of CABG, history of seizure disorder, wheelchair-bound, hyperlipidemia, chronic diastolic heart failure presented to the ER today with worsening fatigue. ?  ?Patient was recently admitted to [facility] from May 20, 2021 through May 23, 2021. Admitted there for asthma/bronchitis.   ?  ?Patient lives in a house next door to his family, he has adult senior sitters during the day and family is around at night for any needs.  He typically is able to transfer from bed to motorized scooter or chair with only minimal assist for "balance guarding" per his daughter.  More recently he is having worsening fatigue  and weakness requiring 2-3 people and max assist for even simple transfers which is not his baseline.  He continues to have multiple falls while attempting to transfer over the past week due to worsening weakness. ?  ?Ambulatory dysfunction acute on chronic  ?Seen by PT OT who recommended SNF.  Insurance authorization received and bed has been arranged for him.  He is being discharged today. ?  ?Elevated troponin, POA, resolved ?Cardiology consulted ?Echo with normal EF and no regional wall motion abnormalities ?No further evaluation or testing per cardiology  ?  ?AKI (acute kidney injury) ([place]) ?Resolved. ?  ?Hypophosphatemia- Repleted ?Hypokalemia-repleted, within normal limits  ?  ?Seizure disorder ([place]) ?Continue Depakote  ?  ?S/P CABG (coronary artery bypass graft) ?Remained asymptomatic.  Continue statin, Zetia, Plavix and aspirin  ?  ?Chronic diastolic (congestive) heart failure (HCC) ?Euvolemic -not in acute exacerbation ?  ?Hemiparesis affecting left side as late effect of cerebrovascular accident [facility]) ?Chronic left upper and lower extremity weakness, right side dominant hand ?Continue aspirin and Plavix ?  ?Type 2 diabetes mellitus with complication, with long-term current use of insulin ([place]) ?Resume home medications including Jardiance, metformin and Lantus. ?  ?Hypertension associated with diabetes ([place]) ?Blood pressure fairly controlled but he did have asymptomatic bradycardia for that reason, his Lopressor was reduced from 70m to 12.5 mg twice daily. ? ?Discharge plan was discussed with patient and/or family member and they verbalized understanding and agreed with it.  ?Discharge Diagnoses:  ?Principal Problem: ?  Elevated troponin ?Active Problems: ?  AKI (acute kidney injury) ([place] ?  Hyperlipidemia ?  Hypertension associated with diabetes ([place]) ?  S/P CABG (coronary artery bypass graft) ?  Type 2 diabetes mellitus with complication, with long-term current use of insulin ([place]) ?  Hemiparesis  affecting left side as late effect of cerebrovascular accident [facility]) ?  Chronic diastolic (congestive) heart failure (HCC) ?  Seizure disorder ([place]) ?  DNR (do not resuscitate)/DNI(Do Not intubate) ? ? ? ?Discharge Instructions ? ? ?Allergies as of 06/10/2021   ? ?   Reactions  ? Penicillins Anaphylaxis  ? Lisinopril Cough  ? ?  ? ?  ?Medication List  ?  ? ?STOP taking these medications   ? ?amoxicillin-clavulanate 875-125 MG tablet ?Commonly known as: AUGMENTIN ?  ?losartan 25 MG tablet ?Commonly known as: COZAAR ?  ?losartan 50 MG tablet ?Commonly known as: COZAAR ?  ?metoprolol succinate 25 MG 24 hr tablet ?Commonly known as: TOPROL-XL ?  ?predniSONE 50 MG tablet ?Commonly known as: DELTASONE ?  ? ?  ? ?TAKE these medications   ? ?acetaminophen 325 MG tablet ?Commonly known as: TYLENOL ?Take 2 tablets (650 mg total) by mouth every 4 (four) hours as needed for mild pain (or temp > 37.5 C (99.5 F)). ?  ?albuterol 108 (90 Base) MCG/ACT inhaler ?Commonly known as: VENTOLIN HFA ?Inhale 1-2 puffs into the lungs every 6 (six) hours as needed for wheezing. ?  ?allopurinol 300 MG tablet ?Commonly known as: ZYLOPRIM ?Take 1 tablet (300 mg total) by mouth daily. ?  ?aspirin EC 81 MG EC tablet ?Generic drug: aspirin ?Take 81 mg by mouth daily. ?  ?atorvastatin 80 MG tablet ?Commonly known as: LIPITOR ?Take 1 tablet (80 mg total) by mouth daily. ?  ?blood glucose meter kit and supplies ?Check blood sugar 3 times daily. ?  ?clopidogrel 75 MG tablet ?Commonly known as: PLAVIX ?Take 1 tablet (75 mg total) by mouth daily. ?  ?CoQ10 100 MG Caps ?Take 300 mg by mouth at bedtime. ?  ?divalproex 500 MG 24 hr tablet ?Commonly known as: DEPAKOTE ER ?TAKE 1 TABLET(500 MG) BY MOUTH DAILY ?What changed: See the new instructions. ?  ?doxepin 10 MG capsule ?Commonly known as: SINEQUAN ?Take 1 capsule (10 mg total) by mouth at bedtime. ?  ?escitalopram 10 MG tablet ?Commonly known as: LEXAPRO ?Take 10 mg by mouth daily. ?  ?ezetimibe 10 MG  tablet ?Commonly known as: ZETIA ?Take 1 tablet (10 mg total) by mouth daily. ?  ?Jardiance 10 MG Tabs tablet ?Generic drug: empagliflozin ?TAKE 1 TABLET(10 MG) BY MOUTH DAILY BEFORE BREAKFAST ?What changed: See the new instructions. ?  ?linagliptin 5 MG Tabs tablet ?Commonly known as: Tradjenta ?Take 1 tablet (5 mg total) by mouth daily. ?  ?Magnesium Oxide 420 MG Tabs ?Take 420 mg by mouth daily. ?  ?metFORMIN 500 MG 24 hr tablet ?Commonly known as: GLUCOPHAGE-XR ?Take 500 mg by mouth daily. ?  ?metoprolol tartrate 25 MG tablet ?Commonly known as: LOPRESSOR ?Take 0.5 tablets (12.5 mg total) by mouth 2 (two) times daily. ?What changed:  ?medication strength ?how much to take ?  ?MUCINEX PO ?Take 600 mg by mouth 2 (two) times daily as needed (cough). ?  ?niacin 500 MG CR tablet ?Commonly known as: NIASPAN ?TAKE 1 TABLET(500 MG) BY MOUTH AT BEDTIME ?What changed: See the new instructions. ?  ?Novofine Pen Needle 32G X 6 MM Misc ?Generic drug: Insulin Pen Needle ?INJECT INSULIN DAILY ?  ?ONE TOUCH ULTRA TEST test strip ?Generic drug: glucose blood ?  ?OneTouch Delica  Plus Lancet30G Misc ?  ?senna-docusate 8.6-50 MG tablet ?Commonly known as: Senokot-S ?Take 1 tablet by mouth 2 (two) times daily. ?What changed:  ?when to take this ?reasons to take this ?  ?tamsulosin 0.4 MG Caps capsule ?Commonly known as: FLOMAX ?Take 1 capsule (0.4 mg total) by mouth daily after supper. ?  ?Toujeo SoloStar 300 UNIT/ML Solostar Pen ?Generic drug: insulin glargine (1 Unit Dial) ?Inject 22 Units into the skin at bedtime. ?  ?Vitamin D 50 MCG (2000 UT) tablet ?Take 2,000 Units by mouth daily. ?  ? ?  ? ? Follow-up Information   ? ? [facility] in 1 week(s).   ?Contact information: ?[address] ?[place] [address] ?[phone] ? ? ?  ?  ? ?  ?  ? ?  ? ?Allergies  ?Allergen Reactions  ? Penicillins Anaphylaxis  ? Lisinopril Cough  ? ? ?Consultations: Cardiology ? ? ?Procedures/Studies: ?DG Chest 2 View ? ?Result  Date: 05/27/2021 ?CLINICAL DATA:  Dyspnea. EXAM: CHEST - 2 VIEW COMPARISON:  None. FINDINGS: The lateral views limited in evaluation secondary to positioning of the patient's upper extremity. Multiple sternal

## 2021-06-10 NOTE — Plan of Care (Signed)
?  Problem: Education: ?Goal: Knowledge of General Education information will improve ?Description: Including pain rating scale, medication(s)/side effects and non-pharmacologic comfort measures ?06/10/2021 1507 by Serafina Mitchell, RN ?Outcome: Completed/Met ?06/10/2021 1507 by Serafina Mitchell, RN ?Outcome: Adequate for Discharge ?06/10/2021 1347 by Serafina Mitchell, RN ?Outcome: Progressing ?  ?Problem: Health Behavior/Discharge Planning: ?Goal: Ability to manage health-related needs will improve ?06/10/2021 1507 by Serafina Mitchell, RN ?Outcome: Completed/Met ?06/10/2021 1507 by Serafina Mitchell, RN ?Outcome: Adequate for Discharge ?06/10/2021 1347 by Serafina Mitchell, RN ?Outcome: Progressing ?  ?Problem: Clinical Measurements: ?Goal: Ability to maintain clinical measurements within normal limits will improve ?06/10/2021 1507 by Serafina Mitchell, RN ?Outcome: Completed/Met ?06/10/2021 1507 by Serafina Mitchell, RN ?Outcome: Adequate for Discharge ?06/10/2021 1347 by Serafina Mitchell, RN ?Outcome: Progressing ?Goal: Will remain free from infection ?06/10/2021 1507 by Serafina Mitchell, RN ?Outcome: Completed/Met ?06/10/2021 1507 by Serafina Mitchell, RN ?Outcome: Adequate for Discharge ?06/10/2021 1347 by Serafina Mitchell, RN ?Outcome: Progressing ?Goal: Diagnostic test results will improve ?06/10/2021 1507 by Serafina Mitchell, RN ?Outcome: Completed/Met ?06/10/2021 1507 by Serafina Mitchell, RN ?Outcome: Adequate for Discharge ?06/10/2021 1347 by Serafina Mitchell, RN ?Outcome: Progressing ?Goal: Respiratory complications will improve ?06/10/2021 1507 by Serafina Mitchell, RN ?Outcome: Completed/Met ?06/10/2021 1507 by Serafina Mitchell, RN ?Outcome: Adequate for Discharge ?06/10/2021 1347 by Serafina Mitchell, RN ?Outcome: Progressing ?Goal: Cardiovascular complication will be avoided ?06/10/2021 1507 by Serafina Mitchell, RN ?Outcome: Completed/Met ?06/10/2021 1507 by Serafina Mitchell, RN ?Outcome: Adequate for Discharge ?06/10/2021 1347 by Serafina Mitchell, RN ?Outcome: Progressing ?  ?Problem: Activity: ?Goal: Risk for activity intolerance will decrease ?06/10/2021 1507 by Serafina Mitchell, RN ?Outcome: Completed/Met ?06/10/2021 1507 by Serafina Mitchell, RN ?Outcome: Adequate for Discharge ?06/10/2021 1347 by Serafina Mitchell, RN ?Outcome: Progressing ?  ?Problem: Elimination: ?Goal: Will not experience complications related to urinary retention ?06/10/2021 1507 by Serafina Mitchell, RN ?Outcome: Completed/Met ?06/10/2021 1507 by Serafina Mitchell, RN ?Outcome: Adequate for Discharge ?06/10/2021 1347 by Serafina Mitchell, RN ?Outcome: Progressing ?  ?Problem: Pain Managment: ?Goal: General experience of comfort will improve ?06/10/2021 1507 by Serafina Mitchell, RN ?Outcome: Completed/Met ?06/10/2021 1507 by Serafina Mitchell, RN ?Outcome: Adequate for Discharge ?06/10/2021 1347 by Serafina Mitchell, RN ?Outcome: Progressing ?  ?Problem: Safety: ?Goal: Ability to remain free from injury will improve ?06/10/2021 1507 by Serafina Mitchell, RN ?Outcome: Completed/Met ?06/10/2021 1507 by Serafina Mitchell, RN ?Outcome: Adequate for Discharge ?06/10/2021 1347 by Serafina Mitchell, RN ?Outcome: Progressing ?  ?Problem: Skin Integrity: ?Goal: Risk for impaired skin integrity will decrease ?06/10/2021 1507 by Serafina Mitchell, RN ?Outcome: Completed/Met ?06/10/2021 1507 by Serafina Mitchell, RN ?Outcome: Adequate for Discharge ?06/10/2021 1347 by Serafina Mitchell, RN ?Outcome: Progressing ?  ?

## 2021-06-10 NOTE — Plan of Care (Signed)
?  Problem: Education: ?Goal: Knowledge of General Education information will improve ?Description: Including pain rating scale, medication(s)/side effects and non-pharmacologic comfort measures ?06/10/2021 1507 by Serafina Mitchell, RN ?Outcome: Adequate for Discharge ?06/10/2021 1347 by Serafina Mitchell, RN ?Outcome: Progressing ?  ?Problem: Health Behavior/Discharge Planning: ?Goal: Ability to manage health-related needs will improve ?06/10/2021 1507 by Serafina Mitchell, RN ?Outcome: Adequate for Discharge ?06/10/2021 1347 by Serafina Mitchell, RN ?Outcome: Progressing ?  ?Problem: Clinical Measurements: ?Goal: Ability to maintain clinical measurements within normal limits will improve ?06/10/2021 1507 by Serafina Mitchell, RN ?Outcome: Adequate for Discharge ?06/10/2021 1347 by Serafina Mitchell, RN ?Outcome: Progressing ?Goal: Will remain free from infection ?06/10/2021 1507 by Serafina Mitchell, RN ?Outcome: Adequate for Discharge ?06/10/2021 1347 by Serafina Mitchell, RN ?Outcome: Progressing ?Goal: Diagnostic test results will improve ?06/10/2021 1507 by Serafina Mitchell, RN ?Outcome: Adequate for Discharge ?06/10/2021 1347 by Serafina Mitchell, RN ?Outcome: Progressing ?Goal: Respiratory complications will improve ?06/10/2021 1507 by Serafina Mitchell, RN ?Outcome: Adequate for Discharge ?06/10/2021 1347 by Serafina Mitchell, RN ?Outcome: Progressing ?Goal: Cardiovascular complication will be avoided ?06/10/2021 1507 by Serafina Mitchell, RN ?Outcome: Adequate for Discharge ?06/10/2021 1347 by Serafina Mitchell, RN ?Outcome: Progressing ?  ?Problem: Activity: ?Goal: Risk for activity intolerance will decrease ?06/10/2021 1507 by Serafina Mitchell, RN ?Outcome: Adequate for Discharge ?06/10/2021 1347 by Serafina Mitchell, RN ?Outcome: Progressing ?  ?Problem: Elimination: ?Goal: Will not experience complications related to urinary retention ?06/10/2021 1507 by Serafina Mitchell,  RN ?Outcome: Adequate for Discharge ?06/10/2021 1347 by Serafina Mitchell, RN ?Outcome: Progressing ?  ?Problem: Pain Managment: ?Goal: General experience of comfort will improve ?06/10/2021 1507 by Serafina Mitchell, RN ?Outcome: Adequate for Discharge ?06/10/2021 1347 by Serafina Mitchell, RN ?Outcome: Progressing ?  ?Problem: Safety: ?Goal: Ability to remain free from injury will improve ?06/10/2021 1507 by Serafina Mitchell, RN ?Outcome: Adequate for Discharge ?06/10/2021 1347 by Serafina Mitchell, RN ?Outcome: Progressing ?  ?Problem: Skin Integrity: ?Goal: Risk for impaired skin integrity will decrease ?06/10/2021 1507 by Serafina Mitchell, RN ?Outcome: Adequate for Discharge ?06/10/2021 1347 by Serafina Mitchell, RN ?Outcome: Progressing ?  ?

## 2021-06-10 NOTE — Plan of Care (Signed)
?  Problem: Education: ?Goal: Knowledge of General Education information will improve ?Description: Including pain rating scale, medication(s)/side effects and non-pharmacologic comfort measures ?06/10/2021 1507 by Serafina Mitchell, RN ?Outcome: Completed/Met ?06/10/2021 1507 by Serafina Mitchell, RN ?Outcome: Adequate for Discharge ?06/10/2021 1347 by Serafina Mitchell, RN ?Outcome: Progressing ?  ?Problem: Health Behavior/Discharge Planning: ?Goal: Ability to manage health-related needs will improve ?06/10/2021 1507 by Serafina Mitchell, RN ?Outcome: Completed/Met ?06/10/2021 1507 by Serafina Mitchell, RN ?Outcome: Adequate for Discharge ?06/10/2021 1347 by Serafina Mitchell, RN ?Outcome: Progressing ?  ?Problem: Clinical Measurements: ?Goal: Ability to maintain clinical measurements within normal limits will improve ?06/10/2021 1507 by Serafina Mitchell, RN ?Outcome: Completed/Met ?06/10/2021 1507 by Serafina Mitchell, RN ?Outcome: Adequate for Discharge ?06/10/2021 1347 by Serafina Mitchell, RN ?Outcome: Progressing ?Goal: Will remain free from infection ?06/10/2021 1507 by Serafina Mitchell, RN ?Outcome: Completed/Met ?06/10/2021 1507 by Serafina Mitchell, RN ?Outcome: Adequate for Discharge ?06/10/2021 1347 by Serafina Mitchell, RN ?Outcome: Progressing ?Goal: Diagnostic test results will improve ?06/10/2021 1507 by Serafina Mitchell, RN ?Outcome: Completed/Met ?06/10/2021 1507 by Serafina Mitchell, RN ?Outcome: Adequate for Discharge ?06/10/2021 1347 by Serafina Mitchell, RN ?Outcome: Progressing ?Goal: Respiratory complications will improve ?06/10/2021 1507 by Serafina Mitchell, RN ?Outcome: Completed/Met ?06/10/2021 1507 by Serafina Mitchell, RN ?Outcome: Adequate for Discharge ?06/10/2021 1347 by Serafina Mitchell, RN ?Outcome: Progressing ?Goal: Cardiovascular complication will be avoided ?06/10/2021 1507 by Serafina Mitchell, RN ?Outcome: Completed/Met ?06/10/2021 1507 by Serafina Mitchell, RN ?Outcome: Adequate for Discharge ?06/10/2021 1347 by Serafina Mitchell, RN ?Outcome: Progressing ?  ?Problem: Activity: ?Goal: Risk for activity intolerance will decrease ?06/10/2021 1507 by Serafina Mitchell, RN ?Outcome: Completed/Met ?06/10/2021 1507 by Serafina Mitchell, RN ?Outcome: Adequate for Discharge ?06/10/2021 1347 by Serafina Mitchell, RN ?Outcome: Progressing ?  ?Problem: Elimination: ?Goal: Will not experience complications related to urinary retention ?06/10/2021 1507 by Serafina Mitchell, RN ?Outcome: Completed/Met ?06/10/2021 1507 by Serafina Mitchell, RN ?Outcome: Adequate for Discharge ?06/10/2021 1347 by Serafina Mitchell, RN ?Outcome: Progressing ?  ?Problem: Pain Managment: ?Goal: General experience of comfort will improve ?06/10/2021 1507 by Serafina Mitchell, RN ?Outcome: Completed/Met ?06/10/2021 1507 by Serafina Mitchell, RN ?Outcome: Adequate for Discharge ?06/10/2021 1347 by Serafina Mitchell, RN ?Outcome: Progressing ?  ?Problem: Safety: ?Goal: Ability to remain free from injury will improve ?06/10/2021 1507 by Serafina Mitchell, RN ?Outcome: Completed/Met ?06/10/2021 1507 by Serafina Mitchell, RN ?Outcome: Adequate for Discharge ?06/10/2021 1347 by Serafina Mitchell, RN ?Outcome: Progressing ?  ?Problem: Skin Integrity: ?Goal: Risk for impaired skin integrity will decrease ?06/10/2021 1507 by Serafina Mitchell, RN ?Outcome: Completed/Met ?06/10/2021 1507 by Serafina Mitchell, RN ?Outcome: Adequate for Discharge ?06/10/2021 1347 by Serafina Mitchell, RN ?Outcome: Progressing ?  ?Problem: Acute Rehab OT Goals (only OT should resolve) ?Goal: Pt. Will Perform Grooming ?Outcome: Completed/Met ?Goal: OT Additional ADL Goal #1 ?Outcome: Completed/Met ?Goal: OT Additional ADL Goal #2 ?Outcome: Completed/Met ?  ?Problem: Acute Rehab PT Goals(only PT should resolve) ?Goal: Pt Will Go Supine/Side To Sit ?Outcome: Completed/Met ?Goal: Pt Will Go Sit To  Supine/Side ?Outcome: Completed/Met ?Goal: Patient Will Perform Sitting Balance ?Outcome: Completed/Met ?Goal: Pt Will Transfer Bed To Chair/Chair To Bed ?Outcome: Completed/Met ?  ?

## 2021-06-10 NOTE — Progress Notes (Signed)
Physical Therapy Treatment ?Patient Details ?Name: Fred [name] ?MRN: [id number] ?DOB: [birth year] ?Today's Date: 06/10/2021 ? ? ?History of Present Illness 86 year old male with history of type 2 diabetes on insulin, history of stroke with resultant left-sided hemiparesis, hypertension, history of CABG, history of seizure disorder, wheelchair-bound, hyperlipidemia, chronic diastolic heart failure presents to the with continued fatigue. ? ?  ?PT Comments  ? ? The pt was eager to work with therapists, but does present with flatter affect and needed increased cues and processing time to follow instructions today. He was able to complete sit-stand transfers with significant assist of 2, elevated surface, bilateral knee blocking, and use of bed pad under hips to facilitate hip extension. The pt continues to be motivated to improve, will need significant rehab after d/c to return to prior level of mobility (minA to complete transfers with family assist). SNF recommendation remains appropriate.  ?  ?Recommendations for follow up therapy are one component of a multi-disciplinary discharge planning process, led by the attending physician.  Recommendations may be updated based on patient status, additional functional criteria and insurance authorization. ? ?Follow Up Recommendations ? Skilled nursing-short term rehab (<3 hours/day) ?  ?  ?Assistance Recommended at Discharge Frequent or constant Supervision/Assistance  ?Patient can return home with the following Two people to help with walking and/or transfers;A lot of help with bathing/dressing/bathroom ?  ?Equipment Recommendations ? Hospital bed;Other (comment) (hoyer lift)  ?  ?Recommendations for Other Services   ? ? ?  ?Precautions / Restrictions Precautions ?Precautions: Fall ?Precaution Comments: L hemiparesis (baseline) ?Restrictions ?Weight Bearing Restrictions: No  ?  ? ?Mobility ? Bed Mobility ?Overal bed mobility: Needs Assistance ?Bed Mobility: Supine to Sit ?   ?  ?Supine to sit: Mod assist, +2 for physical assistance, +2 for safety/equipment, HOB elevated ?  ?  ?General bed mobility comments: Pt required multimodal cues to initiate, mod A +2 with assist from bed pad for hips and trunk elevation ?  ? ?Transfers ?Overall transfer level: Needs assistance ?Equipment used: 2 person hand held assist ?Transfers: Sit to/from Stand, Bed to chair/wheelchair/BSC ?Sit to Stand: Max assist, +2 physical assistance, +2 safety/equipment ?  ?  ?Squat pivot transfers: Max assist, +2 physical assistance, +2 safety/equipment ?  ?  ?General transfer comment: sit<>stand from bed with increasing fatigue x3 used bed pad to help "scoop" patient to recliner, Pt attempting to assist - but minimally able ?  ? ?Ambulation/Gait ?  ?  ?  ?  ?  ?  ?  ?General Gait Details: pt uable to achieve full stand, unable to balance or step ? ? ? ?  ?Balance Overall balance assessment: Needs assistance ?Sitting-balance support: Single extremity supported, Feet supported ?Sitting balance-Leahy Scale: Poor ?  ?Postural control: Right lateral lean, Posterior lean ?Standing balance support: Bilateral upper extremity supported ?Standing balance-Leahy Scale: Zero ?  ?  ?  ?  ?  ?  ?  ?  ?  ?  ?  ?  ?  ? ?  ?Cognition Arousal/Alertness: Awake/alert ?Behavior During Therapy: Flat affect ?Overall Cognitive Status: Impaired/Different from baseline ?Area of Impairment: Problem solving, Following commands, Attention ?  ?  ?  ?  ?  ?  ?  ?  ?  ?Current Attention Level: Selective ?  ?Following Commands: Follows one step commands with increased time ?  ?  ?Problem Solving: Slow processing, Decreased initiation, Requires verbal cues, Requires tactile cues ?General Comments: flat affect  today during session, following commands but increased  time for processing and multimodal cues at times, still pleasant ?  ?  ? ?  ?Exercises Other Exercises ?Other Exercises: sit-stand from slightly elevated EOB x 4 maxA of 2 with facilitation  at hips. progressivley longer stands up to 10 sec ?Other Exercises: seated trunk flexion/crunches at EOB with modA x5 ? ?  ?General Comments General comments (skin integrity, edema, etc.): VSS during session ?  ?  ? ?Pertinent Vitals/Pain Pain Assessment ?Pain Assessment: No/denies pain  ? ? ? ?PT Goals (current goals can now be found in the care plan section) Acute Rehab PT Goals ?Patient Stated Goal: to return home ?PT Goal Formulation: With patient ?Time For Goal Achievement: 06/15/21 ?Potential to Achieve Goals: Fair ?Progress towards PT goals: Progressing toward goals ? ?  ?Frequency ? ? ? Min 2X/week ? ? ? ?  ?PT Plan Current plan remains appropriate  ? ? ?Co-evaluation PT/OT/SLP Co-Evaluation/Treatment: Yes ?Reason for Co-Treatment: Necessary to address cognition/behavior during functional activity;For patient/therapist safety;Complexity of the patient's impairments (multi-system involvement);To address functional/ADL transfers ?PT goals addressed during session: Mobility/safety with mobility;Balance;Strengthening/ROM ?OT goals addressed during session: ADL's and self-care;Strengthening/ROM ?  ? ?  ?AM-PAC PT "6 Clicks" Mobility   ?Outcome Measure ? Help needed turning from your back to your side while in a flat bed without using bedrails?: A Lot ?Help needed moving from lying on your back to sitting on the side of a flat bed without using bedrails?: Total ?Help needed moving to and from a bed to a chair (including a wheelchair)?: Total ?Help needed standing up from a chair using your arms (e.g., wheelchair or bedside chair)?: Total ?Help needed to walk in hospital room?: Total ?Help needed climbing 3-5 steps with a railing? : Total ?6 Click Score: 7 ? ?  ?End of Session Equipment Utilized During Treatment: Gait belt ?Activity Tolerance: Patient tolerated treatment well ?Patient left: in chair;with call bell/phone within reach;with chair alarm set;with family/visitor present ?Nurse Communication: Mobility  status;Need for lift equipment ?PT Visit Diagnosis: Muscle weakness (generalized) (M62.81);Other abnormalities of gait and mobility (R26.89) ?  ? ? ?Time: 1572-6203 ?PT Time Calculation (min) (ACUTE ONLY): 27 min ? ?Charges:  $Therapeutic Exercise: 8-22 mins          ?          ? ?West Carbo, PT, DPT  ? ?Acute Rehabilitation Department ?Pager #: [phone] - [phone] ? ? ?Sandra Cockayne ?06/10/2021, 10:08 AM ? ?

## 2021-06-10 NOTE — TOC Transition Note (Signed)
Transition of Care (TOC) - CM/SW Discharge Note ? ? ?Patient Details  ?Name: Fred [name] ?MRN: [id number] ?Date of Birth: February 21, 1936 ? ?Transition of Care (TOC) CM/SW Contact:  ?Reece Agar, LCSWA ?Phone Number: ?06/10/2021, 11:12 AM ? ? ?Clinical Narrative:    ?Patient will DC to: [facility] ?Anticipated DC date: 06/10/2021 ?Family notified: Pt Daughter ?Transport by: Corey Harold ? ? ?Per MD patient ready for DC to [facility]. RN to call report prior to discharge [phone]). RN, patient, patient's family, and facility notified of DC. Discharge Summary and FL2 sent to facility. DC packet on chart. Ambulance transport requested for patient.  ? ?CSW will sign off for now as social work intervention is no longer needed. Please consult [place] again if new needs arise. ?  ? ? ?Final next level of care: [place] ?Barriers to Discharge: Continued Medical Work up, Ship broker, SNF Pending bed offer ? ? ?Patient Goals and CMS Choice ?  ?CMS Medicare.gov Compare Post Acute Care list provided to:: Patient ?Choice offered to / list presented to : Patient ? ?Discharge Placement ?  ?           ?  ?  ?  ?  ? ?Discharge Plan and Services ?  ?  ?           ?  ?  ?  ?  ?  ?  ?  ?  ?  ?  ? ?Social Determinants of Health (SDOH) Interventions ?  ? ? ?Readmission Risk Interventions ?   ? View : No data to display.  ?  ?  ?  ? ? ? ? ? ?

## 2021-06-10 NOTE — Discharge Instructions (Addendum)
Discharge instructions given to [facility] male representative and one of his daughters. The packet contained the DNR and Mr. Charlotte Crumb is ready for transport to [facility]. He denies having any pain or discomfort. ?

## 2021-06-10 NOTE — Progress Notes (Signed)
Patient states dentures are at home and he has not had them here at the hospital. Pt is toothless. ?

## 2021-06-10 NOTE — TOC Progression Note (Addendum)
Transition of Care (TOC) - Progression Note  ? ? ?Patient Details  ?Name: Fred [name] ?MRN: [id number] ?Date of Birth: 04/07/1935 ? ?Transition of Care (TOC) CM/SW Contact  ?Finnick Orosz Renold Don, LCSWA ?Phone Number: ?06/10/2021, 8:54 AM ? ?Clinical Narrative:    ?Pt Fred [name] was approved ? ? ?Expected Discharge Plan: [place] ?Barriers to Discharge: Continued Medical Work up, Ship broker, SNF Pending bed offer ? ?Expected Discharge Plan and Services ?Expected Discharge Plan: [place] ?  ?  ?  ?  ?                ?  ?  ?  ?  ?  ?  ?  ?  ?  ?  ? ? ?Social Determinants of Health (SDOH) Interventions ?  ? ?Readmission Risk Interventions ?   ? View : No data to display.  ?  ?  ?  ? ? ?

## 2021-06-10 NOTE — Progress Notes (Signed)
Occupational Therapy Treatment ?Patient Details ?Name: Fred [name] ?MRN: [id number] ?DOB: 1935-12-02 ?Today's Date: 06/10/2021 ? ? ?History of present illness 86 year old male with history of type 2 diabetes on insulin, history of stroke with resultant left-sided hemiparesis, hypertension, history of CABG, history of seizure disorder, wheelchair-bound, hyperlipidemia, chronic diastolic heart failure presents to the with continued fatigue. ?  ?OT comments ? Pt with limited progress this session, affect was much flatter, trouble following directions like previous sessions, still able to complete sit<>stand x 3, max A for LB dressing, mod A for UB bathing, max A +2 for transfer to recliner. Pt remains appropriate for SNF post-acute and OT will continue to follow acutely.   ? ?Recommendations for follow up therapy are one component of a multi-disciplinary discharge planning process, led by the attending physician.  Recommendations may be updated based on patient status, additional functional criteria and insurance authorization. ?   ?Follow Up Recommendations ? Skilled nursing-short term rehab (<3 hours/day)  ?  ?Assistance Recommended at Discharge Frequent or constant Supervision/Assistance  ?Patient can return home with the following ? Two people to help with walking and/or transfers;Two people to help with bathing/dressing/bathroom;Direct supervision/assist for medications management;Direct supervision/assist for financial management;Assist for transportation;Assistance with cooking/housework;Help with stairs or ramp for entrance ?  ?Equipment Recommendations ? Hospital bed;Other (comment);Wheelchair cushion (measurements OT);Wheelchair (measurements OT)  ?  ?Recommendations for Other Services   ? ?  ?Precautions / Restrictions Precautions ?Precautions: Fall ?Precaution Comments: L hemiparesis (baseline) ?Restrictions ?Weight Bearing Restrictions: No  ? ? ?  ? ?Mobility Bed Mobility ?Overal bed mobility: Needs  Assistance ?Bed Mobility: Supine to Sit ?  ?  ?Supine to sit: Mod assist, +2 for physical assistance, +2 for safety/equipment, HOB elevated ?  ?  ?General bed mobility comments: Pt required multimodal cues to initiate, mod A +2 with assist from bed pad for hips and trunk elevation ?  ? ?Transfers ?Overall transfer level: Needs assistance ?Equipment used: 2 person hand held assist ?Transfers: Sit to/from Stand, Bed to chair/wheelchair/BSC ?Sit to Stand: Max assist, +2 physical assistance, +2 safety/equipment ?  ?Squat pivot transfers: Max assist, +2 physical assistance, +2 safety/equipment ?  ?  ?  ?General transfer comment: sit<>stand from bed with increasing fatigue x3 used bed pad to help "scoop" patient to recliner, Pt attempting to assist - but minimally able ?  ?  ?Balance Overall balance assessment: Needs assistance ?Sitting-balance support: Single extremity supported, Feet supported ?Sitting balance-Leahy Scale: Poor ?  ?Postural control: Right lateral lean, Posterior lean ?Standing balance support: Bilateral upper extremity supported ?Standing balance-Leahy Scale: Zero ?  ?  ?  ?  ?  ?  ?  ?  ?  ?  ?  ?  ?   ? ?ADL either performed or assessed with clinical judgement  ? ?ADL Overall ADL's : Needs assistance/impaired ?Eating/Feeding: Set up;Bed level (HOB elevated) ?Eating/Feeding Details (indicate cue type and reason): finishing up breakfast as therapy team came in ?  ?  ?Upper Body Bathing: Moderate assistance ?Upper Body Bathing Details (indicate cue type and reason): sitting EOB for washing back ?  ?  ?  ?  ?Lower Body Dressing: Maximal assistance;Bed level ?  ?Toilet Transfer: Total assistance;+2 for physical assistance;+2 for safety/equipment;Squat-pivot ?  ?Toileting- Clothing Manipulation and Hygiene: Maximal assistance ?  ?  ?  ?  ?General ADL Comments: decreased cognition today, more fatigued ?  ? ?Extremity/Trunk Assessment   ?  ?  ?  ?  ?  ? ?Vision   ?  ?  ?  Perception   ?  ?Praxis   ?   ? ?Cognition Arousal/Alertness: Awake/alert ?Behavior During Therapy: Flat affect ?Overall Cognitive Status: Impaired/Different from baseline ?Area of Impairment: Problem solving, Following commands, Attention ?  ?  ?  ?  ?  ?  ?  ?  ?  ?Current Attention Level: Selective ?  ?Following Commands: Follows one step commands with increased time ?  ?  ?Problem Solving: Slow processing, Decreased initiation, Requires verbal cues, Requires tactile cues ?General Comments: flat affect  today during session, following commands but increased time for processing and multimodal cues at times, still pleasant ?  ?  ?   ?Exercises   ? ?  ?Shoulder Instructions   ? ? ?  ?General Comments    ? ? ?Pertinent Vitals/ Pain       Pain Assessment ?Pain Assessment: No/denies pain ? ?Home Living   ?  ?  ?  ?  ?  ?  ?  ?  ?  ?  ?  ?  ?  ?  ?  ?  ?  ?  ? ?  ?Prior Functioning/Environment    ?  ?  ?  ?   ? ?Frequency ? Min 2X/week  ? ? ? ? ?  ?Progress Toward Goals ? ?OT Goals(current goals can now be found in the care plan section) ? Progress towards OT goals: Progressing toward goals ? ?Acute Rehab OT Goals ?Patient Stated Goal: get home ?OT Goal Formulation: With patient ?Time For Goal Achievement: 06/11/21 ?Potential to Achieve Goals: Good  ?Plan Discharge plan remains appropriate   ? ?Co-evaluation ? ? ? PT/OT/SLP Co-Evaluation/Treatment: Yes ?Reason for Co-Treatment: For patient/therapist safety;To address functional/ADL transfers ?PT goals addressed during session: Mobility/safety with mobility;Balance;Strengthening/ROM ?OT goals addressed during session: ADL's and self-care;Strengthening/ROM ?  ? ?  ?AM-PAC OT "6 Clicks" Daily Activity     ?Outcome Measure ? ? Help from another person eating meals?: A Little ?Help from another person taking care of personal grooming?: A Lot ?Help from another person toileting, which includes using toliet, bedpan, or urinal?: Total ?Help from another person bathing (including washing, rinsing, drying)?:  A Lot ?Help from another person to put on and taking off regular upper body clothing?: A Lot ?Help from another person to put on and taking off regular lower body clothing?: Total ?6 Click Score: 11 ? ?  ?End of Session Equipment Utilized During Treatment: Gait belt ? ?OT Visit Diagnosis: Muscle weakness (generalized) (M62.81);Hemiplegia and hemiparesis;Other symptoms and signs involving cognitive function ?Hemiplegia - Right/Left: Left ?Hemiplegia - dominant/non-dominant: Non-Dominant ?Hemiplegia - caused by: Cerebral infarction ?  ?Activity Tolerance Patient limited by fatigue ?  ?Patient Left with call bell/phone within reach;in chair;with chair alarm set;Other (comment) (lift pad in place) ?  ?Nurse Communication Mobility status;Need for lift equipment (flat affect, red rimmed eyes, decreased cognition and affect) ?  ? ?   ? ?Time: 1287-8676 ?OT Time Calculation (min): 27 min ? ?Charges: OT General Charges ?$OT Visit: 1 Visit ?OT Treatments ?$Self Care/Home Management : 8-22 mins ? ?Jesse Sans OTR/L ?Acute Rehabilitation Services ?Pager: [phone] ?Office: [phone] ? ?Fred [name] ?06/10/2021, 9:49 AM ?

## 2021-06-10 NOTE — Plan of Care (Signed)
?  Problem: Education: ?Goal: Knowledge of General Education information will improve ?Description: Including pain rating scale, medication(s)/side effects and non-pharmacologic comfort measures ?Outcome: Progressing ?  ?Problem: Health Behavior/Discharge Planning: ?Goal: Ability to manage health-related needs will improve ?Outcome: Progressing ?  ?Problem: Clinical Measurements: ?Goal: Ability to maintain clinical measurements within normal limits will improve ?Outcome: Progressing ?Goal: Will remain free from infection ?Outcome: Progressing ?Goal: Diagnostic test results will improve ?Outcome: Progressing ?Goal: Respiratory complications will improve ?Outcome: Progressing ?Goal: Cardiovascular complication will be avoided ?Outcome: Progressing ?  ?Problem: Activity: ?Goal: Risk for activity intolerance will decrease ?Outcome: Progressing ?  ?Problem: Elimination: ?Goal: Will not experience complications related to urinary retention ?Outcome: Progressing ?  ?Problem: Pain Managment: ?Goal: General experience of comfort will improve ?Outcome: Progressing ?  ?Problem: Safety: ?Goal: Ability to remain free from injury will improve ?Outcome: Progressing ?  ?Problem: Skin Integrity: ?Goal: Risk for impaired skin integrity will decrease ?Outcome: Progressing ?  ?

## 2021-08-27 ENCOUNTER — Telehealth: Payer: Self-pay | Admitting: *Deleted

## 2021-08-27 ENCOUNTER — Ambulatory Visit: Payer: Medicare Other

## 2021-08-27 NOTE — Telephone Encounter (Unsigned)
Spoke to pt's daughter she informed me that she is needing orders so that her father can get into a nursing facility.   She said that they asked his [place] doctor to do this and was told that since he wasn't requesting this they declined to fill out the forms.  She stated that her father is no longer able to care for himself in anyway, his dementia has gotten worse and even though they have someone to come in 3 days a week and she

## 2021-08-30 ENCOUNTER — Encounter: Payer: Self-pay | Admitting: Physician Assistant

## 2021-08-30 ENCOUNTER — Telehealth (INDEPENDENT_AMBULATORY_CARE_PROVIDER_SITE_OTHER): Payer: Medicare Other | Admitting: Physician Assistant

## 2021-08-30 VITALS — BP 127/80 | HR 65 | Temp 97.6°F | Wt 175.0 lb

## 2021-08-30 DIAGNOSIS — G3184 Mild cognitive impairment, so stated: Secondary | ICD-10-CM

## 2021-08-30 DIAGNOSIS — I739 Peripheral vascular disease, unspecified: Secondary | ICD-10-CM

## 2021-08-30 DIAGNOSIS — I5032 Chronic diastolic (congestive) heart failure: Secondary | ICD-10-CM | POA: Diagnosis not present

## 2021-08-30 DIAGNOSIS — E1159 Type 2 diabetes mellitus with other circulatory complications: Secondary | ICD-10-CM

## 2021-08-30 DIAGNOSIS — G40209 Localization-related (focal) (partial) symptomatic epilepsy and epileptic syndromes with complex partial seizures, not intractable, without status epilepticus: Secondary | ICD-10-CM

## 2021-08-30 DIAGNOSIS — I2510 Atherosclerotic heart disease of native coronary artery without angina pectoris: Secondary | ICD-10-CM

## 2021-08-30 DIAGNOSIS — G40909 Epilepsy, unspecified, not intractable, without status epilepticus: Secondary | ICD-10-CM

## 2021-08-30 DIAGNOSIS — I152 Hypertension secondary to endocrine disorders: Secondary | ICD-10-CM

## 2021-08-30 DIAGNOSIS — E118 Type 2 diabetes mellitus with unspecified complications: Secondary | ICD-10-CM

## 2021-08-30 DIAGNOSIS — I69339 Monoplegia of upper limb following cerebral infarction affecting unspecified side: Secondary | ICD-10-CM

## 2021-08-30 DIAGNOSIS — Z8739 Personal history of other diseases of the musculoskeletal system and connective tissue: Secondary | ICD-10-CM

## 2021-08-30 DIAGNOSIS — I69354 Hemiplegia and hemiparesis following cerebral infarction affecting left non-dominant side: Secondary | ICD-10-CM

## 2021-08-30 DIAGNOSIS — Z794 Long term (current) use of insulin: Secondary | ICD-10-CM

## 2021-08-30 NOTE — Progress Notes (Unsigned)
Gout in left foot  [facility] need admitted  [place] [facility] care

## 2021-08-30 NOTE — Progress Notes (Unsigned)
..Virtual Visit via Video Note  I connected with Fred [name] on 08/31/21 at 11:30 AM EDT by a video enabled telemedicine application and verified that I am speaking with the correct person using two identifiers.  Location: Patient: home Provider: clinic  .Marland KitchenParticipating in visit:  Patient: Fred [name] Patient Daughter present as well Provider: Iran Planas PA-C   I discussed the limitations of evaluation and management by telemedicine and the availability of in person appointments. The patient expressed understanding and agreed to proceed.  History of Present Illness: Pt is a 86 yo male with complicated medication history whose daughters need paperwork filled out to place him at [facility] in [place].   Pt had a stroke in which the left side of his body never regained function. He is extremely difficult to lift and move. He is a 2 person transfer. He has fallen twice in the last 2 months. He has cognitive impairment that is worse at night. Last ED visit was 05/27/2021. Pt is not able to cut and manage feeding himself. He is incontinent. Pt has hx of seizures but not had a seizure in many years since being on medication.   .. Active Ambulatory Problems    Diagnosis Date Noted   PAROTID LESION, UNSPECIFIED 09/29/2006   Hyperlipidemia 03/30/2007   ANEMIA / OTHER 05/16/2007   Partial epilepsy with impairment of consciousness ([place]) 09/29/2006   Diabetic polyneuropathy ([place]) 09/29/2006   Hypertension associated with diabetes ([place]) 09/29/2006   Coronary atherosclerosis 05/16/2007   Subcortical infarction ([place]) 05/16/2007   PERIPHERAL VASCULAR DISEASE 09/29/2006   DIVERTICULOSIS 09/29/2006   DEGENERATIVE JOINT DISEASE 03/30/2007   CARDIOVASCULAR FUNCTION STUDY, ABNORMAL 01/29/2009   History of gout 05/16/2013   Obesity (BMI 30-39.9) 11/29/2013   History of stroke 09/04/2014   S/P CABG (coronary artery bypass graft) 11/04/2014   Type 2 diabetes mellitus with complication, with long-term  current use of insulin ([place]) 03/27/2015   Atherosclerosis of native coronary artery of native heart without angina pectoris 03/27/2015   Primary osteoarthritis of both knees 05/11/2017   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 of localized onset, not intractable, without status epilepticus ([place]) 06/20/2017   History of CVA (cerebrovascular accident) 08/10/2017   Small vessel disease ([place]) 08/11/2017   Left hemiparesis ([place])    Hemiparesis affecting left side as late effect of cerebrovascular accident ([place])    Chronic diastolic (congestive) heart failure ([place])    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[place]) 09/25/2017   Oropharyngeal dysphagia 08/02/2019   Fecal urgency 08/02/2019   Urinary incontinence with continuous leakage 10/11/2019   AKI (acute kidney injury) ([place]) 11/13/2020   Hyperlipidemia associated with type 2 diabetes mellitus ([place]) 11/13/2020   Seizure disorder ([place]) 11/13/2020   Depression with anxiety 11/13/2020   Elevated troponin 05/27/2021   DNR (do not resuscitate)/DNI(Do Not intubate) 05/27/2021   Mild cognitive impairment 08/31/2021   Resolved Ambulatory Problems    Diagnosis Date Noted   Diabetes mellitus without complication ([place]) 67/61/9509   PAROXYSMAL ATRIAL FIBRILLATION 05/16/2007   CHF, chronic ([place]) 04/20/2009   BRONCHITIS, ACUTE WITH MILD BRONCHOSPASM 05/16/2007   NECK PAIN 08/03/2007   DYSPNEA 02/11/2009   TIA (transient ischemic attack) 09/04/2014   DM (diabetes mellitus) ([place]) 09/04/2014   Seizure disorder, complex partial ([place]) 09/04/2014   Weak    Atrial fibrillation, unspecified    Palpitations 11/04/2014   Type 2 diabetes mellitus with complication ([place]) 32/67/1245   Episode of confusion 10/04/2016   Stroke (cerebrum) ([place])  03/21/2017   Benign essential hypertension with delivery    Diabetes mellitus type 2 in obese [facility])    Primary osteoarthritis of right knee    Acute idiopathic  gout of right knee    Acute blood loss anemia    UTI (urinary tract infection) 11/24/2018   Sepsis ([place]) 11/24/2018   Acute CVA (cerebrovascular accident) ([place]) 11/13/2020   COVID    Past Medical History:  Diagnosis Date   CAD (coronary artery disease)    CHF (congestive heart failure) (HCC)    Diverticulosis    DJD (degenerative joint disease)    History of anemia    Hypertension    Peripheral vascular disease (HCC)    Polyneuropathy, diabetic (HCC)    Syncope and collapse       Observations/Objective: Pt was laying in recliner during entire visit No acute distress  .Marland Kitchen Today's Vitals   08/30/21 1118  BP: 127/80  Pulse: 65  Temp: 97.6 F (36.4 C)  TempSrc: Oral  SpO2: 94%  Weight: 175 lb (79.4 kg)   Body mass index is 31 kg/m.    Assessment and Plan: Marland KitchenMarland KitchenShyhiem was seen today for follow-up.  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, unspecified laterality ([place])  Chronic diastolic (congestive) heart failure (HCC)  Atherosclerosis of native coronary artery of native heart without angina pectoris  Hypertension associated with diabetes ([place])  Type 2 diabetes mellitus with complication, with long-term current use of insulin (HCC)  Hemiparesis affecting left side as late effect of cerebrovascular accident ([place])  History of gout  Small vessel disease ([place])  Partial epilepsy with impairment of consciousness ([place])  Seizure disorder ([place])  Mild cognitive impairment   Daughters of patient cannot take care of patient at home any more and need to place him where he can get health.  We discussed FL2 form and will send to [facility] in [place].   Spent 30 minutes completing paperwork and asking related questions for patient.      Follow Up Instructions:    I discussed the assessment and treatment plan with the patient. The patient was provided an opportunity to ask questions and all were  answered. The patient agreed with the plan and demonstrated an understanding of the instructions.   The patient was advised to call back or seek an in-person evaluation if the symptoms worsen or if the condition fails to improve as anticipated.     Iran Planas, PA-C

## 2021-08-31 ENCOUNTER — Encounter: Payer: Self-pay | Admitting: Physician Assistant

## 2021-08-31 DIAGNOSIS — G3184 Mild cognitive impairment, so stated: Secondary | ICD-10-CM | POA: Insufficient documentation

## 2021-09-01 ENCOUNTER — Telehealth: Payer: Self-pay | Admitting: Neurology

## 2021-09-01 NOTE — Telephone Encounter (Signed)
FL2 and patient information faxed to [facility] in [place] to [phone] with confirmation received.   Patient's daughter made aware.
